# Patient Record
Sex: Male | Born: 1940
Health system: Southern US, Community
[De-identification: ages and names within clinical notes are randomized; demographics above are authoritative.]

## PROBLEM LIST (undated history)

## (undated) DIAGNOSIS — G250 Essential tremor: Secondary | ICD-10-CM

## (undated) DIAGNOSIS — J301 Allergic rhinitis due to pollen: Secondary | ICD-10-CM

## (undated) DIAGNOSIS — M940 Chondrocostal junction syndrome [Tietze]: Secondary | ICD-10-CM

## (undated) DIAGNOSIS — M5412 Radiculopathy, cervical region: Secondary | ICD-10-CM

## (undated) DIAGNOSIS — F329 Major depressive disorder, single episode, unspecified: Secondary | ICD-10-CM

## (undated) DIAGNOSIS — G252 Other specified forms of tremor: Secondary | ICD-10-CM

## (undated) DIAGNOSIS — M542 Cervicalgia: Secondary | ICD-10-CM

## (undated) DIAGNOSIS — R569 Unspecified convulsions: Secondary | ICD-10-CM

## (undated) DIAGNOSIS — M545 Low back pain, unspecified: Secondary | ICD-10-CM

## (undated) DIAGNOSIS — I6529 Occlusion and stenosis of unspecified carotid artery: Secondary | ICD-10-CM

## (undated) DIAGNOSIS — S060X9A Concussion with loss of consciousness of unspecified duration, initial encounter: Secondary | ICD-10-CM

## (undated) DIAGNOSIS — S060XAA Concussion with loss of consciousness status unknown, initial encounter: Secondary | ICD-10-CM

## (undated) DIAGNOSIS — K219 Gastro-esophageal reflux disease without esophagitis: Secondary | ICD-10-CM

## (undated) DIAGNOSIS — F32A Depression, unspecified: Secondary | ICD-10-CM

## (undated) HISTORY — DX: Allergic rhinitis due to pollen: J30.1

## (undated) HISTORY — DX: Radiculopathy, cervical region: M54.12

## (undated) HISTORY — DX: Depression, unspecified: F32.A

## (undated) HISTORY — DX: Major depressive disorder, single episode, unspecified: F32.9

## (undated) HISTORY — DX: Chondrocostal junction syndrome (tietze): M94.0

## (undated) HISTORY — DX: Essential tremor: G25.2

## (undated) HISTORY — DX: Low back pain: M54.5

## (undated) HISTORY — DX: Essential tremor: G25.0

## (undated) HISTORY — DX: Cervicalgia: M54.2

## (undated) HISTORY — DX: Occlusion and stenosis of unspecified carotid artery: I65.29

## (undated) HISTORY — DX: Low back pain, unspecified: M54.50

---

## 2000-03-02 HISTORY — PX: STAPEDES SURGERY: SHX789

## 2000-03-02 HISTORY — PX: OTHER SURGICAL HISTORY: SHX169

## 2004-08-31 ENCOUNTER — Emergency Department: Payer: Self-pay | Admitting: Emergency Medicine

## 2004-09-09 ENCOUNTER — Ambulatory Visit: Payer: Self-pay | Admitting: Urology

## 2006-03-02 HISTORY — PX: COLONOSCOPY: SHX174

## 2007-08-22 ENCOUNTER — Ambulatory Visit: Payer: Self-pay | Admitting: Gastroenterology

## 2007-11-21 DIAGNOSIS — J301 Allergic rhinitis due to pollen: Secondary | ICD-10-CM

## 2007-11-21 HISTORY — DX: Allergic rhinitis due to pollen: J30.1

## 2010-08-27 ENCOUNTER — Ambulatory Visit: Payer: Self-pay | Admitting: Family Medicine

## 2011-04-17 ENCOUNTER — Ambulatory Visit: Payer: Self-pay | Admitting: Internal Medicine

## 2012-05-02 ENCOUNTER — Ambulatory Visit: Payer: Self-pay | Admitting: Family Medicine

## 2012-05-19 ENCOUNTER — Ambulatory Visit: Payer: Self-pay | Admitting: Family Medicine

## 2012-06-13 ENCOUNTER — Encounter: Payer: Self-pay | Admitting: General Surgery

## 2012-08-10 ENCOUNTER — Ambulatory Visit: Payer: Self-pay | Admitting: General Surgery

## 2012-08-30 ENCOUNTER — Ambulatory Visit (INDEPENDENT_AMBULATORY_CARE_PROVIDER_SITE_OTHER): Payer: Medicare Other | Admitting: General Surgery

## 2012-08-30 ENCOUNTER — Encounter: Payer: Self-pay | Admitting: General Surgery

## 2012-08-30 DIAGNOSIS — I6529 Occlusion and stenosis of unspecified carotid artery: Secondary | ICD-10-CM

## 2012-08-30 DIAGNOSIS — I6523 Occlusion and stenosis of bilateral carotid arteries: Secondary | ICD-10-CM | POA: Insufficient documentation

## 2012-08-30 NOTE — Patient Instructions (Addendum)
Patient to return in 1 year.

## 2012-08-30 NOTE — Progress Notes (Signed)
Patient ID: Joseph Arroyo, male   DOB: 09-25-1940, 73 y.o.   MRN: [id number]  Chief Complaint  Patient presents with  . Follow-up    Carotid Ultrasound    HPI Joseph Arroyo is a 72 y.o. male who presents for a follow up Carotid Ultrasound. The patient has a history of mild carotid artery plaque with no symptoms. No new problems since last visit.   HPI  Past Medical History  Diagnosis Date  . Ulcer   . Depression   . Occlusion and stenosis of carotid artery without mention of cerebral infarction     Past Surgical History  Procedure Laterality Date  . Other surgical history  2002    ear surgery  . Colonoscopy  2008  . Stapedes surgery Right 2002    History reviewed. No pertinent family history.  Social History History  Substance Use Topics  . Smoking status: Never Smoker   . Smokeless tobacco: Never Used  . Alcohol Use: No    Allergies  Allergen Reactions  . Prednisone     Hyperactivity   . Wheat Bran     Current Outpatient Prescriptions  Medication Sig Dispense Refill  . B Complex Vitamins (VITAMIN B COMPLEX PO) Take 1 tablet by mouth daily.      . Bilberry 80 MG CAPS Take 1 capsule by mouth 2 (two) times daily.      . fluvoxaMINE (LUVOX) 100 MG tablet Take 200 mg by mouth daily.      . Lutein 10 MG TABS Take 1 tablet by mouth 3 (three) times daily.      . Magnesium 250 MG TABS Take 1 tablet by mouth daily.      . mirtazapine (REMERON SOL-TAB) 15 MG disintegrating tablet Take 3.75 mg by mouth at bedtime.      . Nutritional Supplements (NUTRITIONAL SUPPLEMENT PO) Take 2 tablets by mouth daily.      . Saw Palmetto, Serenoa repens, 1000 MG CAPS Take 2 capsules by mouth daily.       No current facility-administered medications for this visit.    Review of Systems Review of Systems  Constitutional: Negative.   Respiratory: Negative.   Cardiovascular: Negative.     Blood pressure 112/60, pulse 58, resp. rate 12, height 5\' 9"  (1.753 m), weight 169 lb  (76.658 kg).  Physical Exam Physical Exam  Constitutional: He is oriented to person, place, and time. He appears well-developed and well-nourished.  Neck: Neck supple. No tracheal deviation present. No thyromegaly present.  Neurological: He is alert and oriented to person, place, and time.    Data Reviewed    Assessment    Asymptomatic carotid plaque     Plan    Yearly f/u Doppler. Pt aware of potential symptoms of TIA and stroke.      Carotid Duplex study. Bilateral Duplex study of carotid arteries in neck performed. Mild focal plaquing noted on both carotid bifurcation. No flow disturbance or apparent stenosis noted.  The ICA/CCA ratio is 0.69 on right and 0.60 on left. Right vertebral has antegrade flow. Left vertebral is not visualized Findings are stable. No stenosis noted.  Drew [name] 09/01/2012, 6:36 AM

## 2012-09-01 ENCOUNTER — Encounter: Payer: Self-pay | Admitting: General Surgery

## 2012-09-23 ENCOUNTER — Encounter: Payer: Self-pay | Admitting: General Surgery

## 2012-12-09 ENCOUNTER — Ambulatory Visit: Payer: Self-pay | Admitting: Emergency Medicine

## 2013-05-17 ENCOUNTER — Encounter: Payer: Self-pay | Admitting: Neurology

## 2013-05-18 ENCOUNTER — Ambulatory Visit (INDEPENDENT_AMBULATORY_CARE_PROVIDER_SITE_OTHER): Payer: Medicare PPO | Admitting: Neurology

## 2013-05-18 ENCOUNTER — Encounter (INDEPENDENT_AMBULATORY_CARE_PROVIDER_SITE_OTHER): Payer: Self-pay

## 2013-05-18 ENCOUNTER — Encounter: Payer: Self-pay | Admitting: Neurology

## 2013-05-18 VITALS — BP 115/72 | HR 56 | Ht 68.0 in | Wt 163.0 lb

## 2013-05-18 DIAGNOSIS — R259 Unspecified abnormal involuntary movements: Secondary | ICD-10-CM

## 2013-05-18 DIAGNOSIS — M545 Low back pain, unspecified: Secondary | ICD-10-CM

## 2013-05-18 DIAGNOSIS — R251 Tremor, unspecified: Secondary | ICD-10-CM | POA: Insufficient documentation

## 2013-05-18 NOTE — Patient Instructions (Signed)
Overall you are doing fairly well but I do want to suggest a few things today:   Remember to drink plenty of fluid, eat healthy meals and do not skip any meals. Try to eat protein with a every meal and eat a healthy snack such as fruit or nuts in between meals. Try to keep a regular sleep-wake schedule and try to exercise daily, particularly in the form of walking, 20-30 minutes a day, if you can.   Please follow up with your psychiatrist to address your depression and anxiety  I would like to see you back in 6 months, sooner if we need to. Please call us with any interim questions, concerns, problems, updates or refill requests.   My clinical assistant and will answer any of your questions and relay your messages to me and also relay most of my messages to you.   Our phone number is [phone]. We also have an after hours call service for urgent matters and there is a physician on-call for urgent questions. For any emergencies you know to call 911 or go to the nearest emergency room

## 2013-05-18 NOTE — Progress Notes (Signed)
[facility]    Provider:  Dr [name] Referring Provider: Ashok [place], MD Primary Care Physician:  Ashok [place], MD  CC:  Tremors in his legs and hands  HPI:  Joseph Arroyo is a 73 y.o. male here as a referral from Dr. Rutherford Nail for tremors  Notes symptoms started a few months ago, he initially thought it was related to the fluvoxamine. He initially noticed the tremor in his legs and now notices a slight tremor in his hands. Describes it as predominantly a rest tremor, does not notice any action/postural tremor. Wife has noticed some slowing down of his walking, notes some difficulty with fine motor tasks. Has occasional episodes of muscle cramping in his legs. Notes a few brief episodes of left nostril tremor. Notes tremor is worse when he is anxious or stressed. No noted REM behavior disorder. Some difficulty with poor sense of smell. Had maternal uncle who may have had Parkinson's disease.   He switched from Fluvoxamine to Brintellix around 5 weeks ago with no improvement. Has history of being on abilify and lithium in the past, both made him feel more shaky.   Has chronic history of lower back pain.   Review of Systems: Out of a complete 14 system review, the patient complains of only the following symptoms, and all other reviewed systems are negative. + tremor, anxiety, depression  History   Social History  . Marital Status: Married    Spouse Name: N/A    Number of Children: N/A  . Years of Education: N/A   Occupational History  . Not on file.   Social History Main Topics  . Smoking status: Never Smoker   . Smokeless tobacco: Never Used  . Alcohol Use: No  . Drug Use: No  . Sexual Activity: Not on file   Other Topics Concern  . Not on file   Social History Narrative   Married to Joseph Arroyo, has 2 children   Right handed   Master's plus    2 cups daily    Family History  Problem Relation Age of Onset  . Heart disease      Past Medical  History  Diagnosis Date  . Ulcer   . Depression   . Occlusion and stenosis of carotid artery without mention of cerebral infarction   . Lumbago   . Thrombocytopenia, unspecified   . Other malaise and fatigue 11/21/2007  . Allergic rhinitis due to pollen 11/21/2007  . Contusion of toe 09/12/2008  . Anxiety state, unspecified 11/28/2008  . Brachial neuritis or radiculitis NOS   . Cramp of limb   . Abnormal involuntary movements(781.0)   . Acute upper respiratory infections of unspecified site   . Cervicalgia   . Conjunctivitis unspecified   . Sprain of hand, unspecified site   . Enthesopathy of unspecified site   . Pain in limb   . Essential and other specified forms of tremor   . Hip, thigh, leg, and ankle, insect bite, nonvenomous, without mention of infection(916.4)   . Chest pain, unspecified   . Sprain of neck   . Dysfunction of eustachian tube   . Costal chondritis   . Sleep disturbance, unspecified   . Contact dermatitis and other eczema due to plants (except food)   . Acute sinusitis, unspecified     Past Surgical History  Procedure Laterality Date  . Other surgical history  2002    ear surgery  . Colonoscopy  2008  . Stapedes surgery Right 2002  Current Outpatient Prescriptions  Medication Sig Dispense Refill  . ALPRAZolam (XANAX) 0.25 MG tablet       . B Complex Vitamins (VITAMIN B COMPLEX PO) Take 1 tablet by mouth daily.      . Capsicum-Garlic 350-093 MG CAPS Take 200-300 mg by mouth.      . Lutein 10 MG TABS Take 1 tablet by mouth 3 (three) times daily.      . Magnesium 250 MG TABS Take 1 tablet by mouth daily.      . mirtazapine (REMERON SOL-TAB) 15 MG disintegrating tablet Take 3.75 mg by mouth at bedtime.      . Multiple Vitamins-Minerals (MULTIVITAMIN WITH MINERALS) tablet Take 1 tablet by mouth daily.      . Saw Palmetto, Serenoa repens, 1000 MG CAPS Take 2 capsules by mouth daily.      . Vortioxetine HBr (BRINTELLIX PO) Take 15 mg by mouth.        No current facility-administered medications for this visit.    Allergies as of 05/18/2013 - Review Complete 05/18/2013  Allergen Reaction Noted  . Prednisone  06/30/2012  . Wheat bran  06/30/2012    Vitals: BP 115/72  Pulse 56  Ht 5\' 8"  (1.727 m)  Wt 163 lb (73.936 kg)  BMI 24.79 kg/m2 Last Weight:  Wt Readings from Last 1 Encounters:  05/18/13 163 lb (73.936 kg)   Last Height:   Ht Readings from Last 1 Encounters:  05/18/13 5\' 8"  (1.727 m)     Physical exam: Exam: Gen: NAD, conversant Eyes: anicteric sclerae, moist conjunctivae HENT: Atraumatic, oropharynx clear Neck: Trachea midline; supple,  Lungs: CTA, no wheezing, rales, rhonic                          CV: RRR, no MRG Abdomen: Soft, non-tender;  Extremities: No peripheral edema  Skin: Normal temperature, no rash,  Psych: Appropriate affect, pleasant  Neuro: MS: AA&Ox3, appropriately interactive, normal affect   Speech: fluent w/o paraphasic error  Memory: good recent and remote recall  CN: PERRL, EOMI no nystagmus, no ptosis, sensation intact to LT V1-V3 bilat, face symmetric, no weakness, hearing grossly intact, palate elevates symmetrically, shoulder shrug 5/5 bilat,  tongue protrudes midline, no fasiculations noted.  Motor: normal bulk and tone Strength: 5/5  In all extremities  Coord: mild rest tremor lue and bilat LE, mild postural and intention tremor bilat hands. Mild bradykinesia with finger taps, none with hand opening.   Reflexes: symmetrical, bilat downgoing toes  Sens: LT intact in all extremities  Gait: mild decrease arm swing on the left. Tandem gait intact. Able to walk on heels and toes. Romberg absent.   Assessment:  After physical and neurologic examination, review of laboratory studies, imaging, neurophysiology testing and pre-existing records, assessment will be reviewed on the problem list.  Plan:  Treatment plan and additional workup will be reviewed under Problem  List.  1)Tremor 2)Lower back pain  72y/o gentleman presenting for initial evaluation of tremor and lower back pain. Has a history of severe depression, has been on abilify and lithium in the past. Based on clinical history and physical exam the differential would be essential tremor vs enhanced physiologic tremor vs less likely a parkinsonism. Prior medication use of abilify and lithium could also be playing a role in his symptoms. Suspect tremor is ultimately ET vs enhanced phsyiologic tremor. Discussed different medication options but patient wishes to hold off on medication at this time. Will  follow up in 6 months and check for progression at that time. Counseled patient to continue with exercise and stretching for lower back pain.    [name], DO  [facility] [address] [place], [place] [address]  Phone [phone] Fax [phone]

## 2013-06-15 ENCOUNTER — Ambulatory Visit: Payer: Self-pay | Admitting: Cardiovascular Disease

## 2013-06-23 ENCOUNTER — Encounter: Payer: Self-pay | Admitting: Cardiovascular Disease

## 2013-06-23 ENCOUNTER — Encounter (INDEPENDENT_AMBULATORY_CARE_PROVIDER_SITE_OTHER): Payer: Self-pay

## 2013-06-23 ENCOUNTER — Ambulatory Visit (INDEPENDENT_AMBULATORY_CARE_PROVIDER_SITE_OTHER): Payer: Medicare PPO | Admitting: Cardiovascular Disease

## 2013-06-23 VITALS — BP 118/72 | HR 57 | Ht 68.0 in | Wt 157.0 lb

## 2013-06-23 DIAGNOSIS — F419 Anxiety disorder, unspecified: Secondary | ICD-10-CM

## 2013-06-23 DIAGNOSIS — F411 Generalized anxiety disorder: Secondary | ICD-10-CM

## 2013-06-23 DIAGNOSIS — R079 Chest pain, unspecified: Secondary | ICD-10-CM

## 2013-06-23 DIAGNOSIS — R251 Tremor, unspecified: Secondary | ICD-10-CM

## 2013-06-23 DIAGNOSIS — R259 Unspecified abnormal involuntary movements: Secondary | ICD-10-CM

## 2013-06-23 DIAGNOSIS — R0602 Shortness of breath: Secondary | ICD-10-CM

## 2013-06-23 NOTE — Assessment & Plan Note (Addendum)
Etiology of his chest pain is unclear. We had a long discussion about his symptoms, risk factors, types of testing available. He is not a smoker, nondiabetic. He does report significant family history of CAD. He is unable to treadmill given his back pain, leg weakness. We will schedule a pharmacologic Myoview at his convenience next week to rule out ischemia

## 2013-06-23 NOTE — Assessment & Plan Note (Signed)
He has noticed progression of the left leg tremor, some leg weakness over the past year.

## 2013-06-23 NOTE — Assessment & Plan Note (Signed)
Anxiety managed by psychiatry in [place]. Currently taking Xanax 3 times per day in addition to Zyprexa and other medication

## 2013-06-23 NOTE — Progress Notes (Signed)
Patient ID: Joseph Arroyo, male    DOB: Aug 20, 1940, 73 y.o.   MRN: [id number]  HPI Comments: Joseph Arroyo is a pleasant 73 year old gentleman with a history of severe anxiety, chronic low back pain, referred for symptoms of chest pain. He is [facility] by [facility] in April 2015.  He reports having chest pain over the past several years, possibly worse over the past several months. Typically this will present with stress. He states that he is active, does walking, rides on a bike, sometimes with discomfort in his chest. More commonly no, this presents with stress and anxiety. He describes it as a tightening deep in his left chest radiating sometimes to his right shoulder blade. His family is very anxious and all have recommended he have a stress test.  He has been "slowing down" and gait is more unstable. He does have periodic diaphoresis. Legs feeling somewhat weaker.  He believes and herbal medications and does significant homeopathic/over-the-counter products.   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 He does not feel that he would be able to treadmill as his back hurts and legs are feeling weaker  EKG shows normal sinus rhythm with rate 57 beats per minute with nonspecific ST abnormality artifact noted on EKG from tremor,    Outpatient Encounter Prescriptions as of 06/23/2013  Medication Sig  . ALPRAZolam (XANAX) 0.25 MG tablet Take 0.25 mg by mouth 3 (three) times daily.   . B Complex Vitamins (VITAMIN B COMPLEX PO) Take 1 tablet by mouth daily.  . Capsicum-Garlic 008-676 MG CAPS Take 200-300 mg by mouth.  . Lutein 10 MG TABS Take 1 tablet by mouth 3 (three) times daily.  . Magnesium 250 MG TABS Take 1 tablet by mouth daily.  .  Multiple Vitamins-Minerals (MULTIVITAMIN WITH MINERALS) tablet Take 1 tablet by mouth daily.  Marland Kitchen OLANZapine (ZYPREXA) 5 MG tablet Take 5 mg by mouth at bedtime.  . Saw Palmetto, Serenoa repens, 1000 MG CAPS Take 2 capsules by mouth daily.  . Vortioxetine HBr (BRINTELLIX PO) Take 15 mg by mouth.     Review of Systems  Constitutional: Negative.   HENT: Negative.   Eyes: Negative.   Respiratory: Negative.   Cardiovascular: Positive for chest pain.  Gastrointestinal: Negative.   Endocrine: Negative.   Musculoskeletal: Positive for back pain.  Skin: Negative.   Allergic/Immunologic: Negative.   Neurological: Negative.   Hematological: Negative.   Psychiatric/Behavioral: Negative.   All other systems reviewed and are negative.   BP 118/72  Pulse 57  Ht 5\' 8"  (1.727 m)  Wt 157 lb (71.215 kg)  BMI 23.88 kg/m2  Physical Exam  Nursing note and vitals reviewed. Constitutional: He is oriented to person, place, and time. He appears well-developed and well-nourished.  HENT:  Head: Normocephalic.  Nose: Nose normal.  Mouth/Throat: Oropharynx is clear and moist.  Eyes: Conjunctivae are normal. Pupils are equal, round, and reactive to light.  Neck: Normal range of motion. Neck supple. No JVD present.  Cardiovascular: Normal rate, regular rhythm, S1 normal, S2 normal, normal heart sounds and intact distal pulses.  Exam reveals no gallop and no friction rub.   No murmur heard. Pulmonary/Chest: Effort normal and breath sounds normal. No respiratory distress. He has no wheezes. He has no rales.  He exhibits no tenderness.  Abdominal: Soft. Bowel sounds are normal. He exhibits no distension. There is no tenderness.  Musculoskeletal: Normal range of motion. He exhibits no edema and no tenderness.  Lymphadenopathy:    He has no cervical adenopathy.  Neurological: He is alert and oriented to person, place, and time. Coordination normal.  Skin: Skin is warm and dry. No rash noted. No erythema.   Psychiatric: He has a normal mood and affect. His behavior is normal. Judgment and thought content normal.      Assessment and Plan

## 2013-06-23 NOTE — Patient Instructions (Addendum)
You are doing well. No medication changes were made.  We will schedule a lexiscan myoview for chest pain  Please call us if you have new issues that need to be addressed before your next appt.   [place]  Your caregiver has ordered a Stress Test with nuclear imaging. The purpose of this test is to evaluate the blood supply to your heart muscle. This procedure is referred to as a "Non-Invasive Stress Test." This is because other than having an IV started in your vein, nothing is inserted or "invades" your body. Cardiac stress tests are done to find areas of poor blood flow to the heart by determining the extent of coronary artery disease (CAD). Some patients exercise on a treadmill, which naturally increases the blood flow to your heart, while others who are  unable to walk on a treadmill due to physical limitations have a pharmacologic/chemical stress agent called Lexiscan . This medicine will mimic walking on a treadmill by temporarily increasing your coronary blood flow.   Please note: these test may take anywhere between 2-4 hours to complete  PLEASE REPORT TO [place] AT THE FIRST DESK WILL DIRECT YOU WHERE TO GO  Date of Procedure:________Tuesday, May 5_______________  Arrival Time for Procedure:______7:15am__________________  Instructions regarding medication:   You may take all of your medications with a sip of water  PLEASE NOTIFY THE OFFICE AT LEAST 24 HOURS IN ADVANCE IF YOU ARE UNABLE TO [place].  [phone] AND  PLEASE NOTIFY NUCLEAR MEDICINE AT [facility] AT LEAST 24 HOURS IN ADVANCE IF YOU ARE UNABLE TO KEEP YOUR APPOINTMENT. [phone]  How to prepare for your Myoview test:  1. Do not eat or drink after midnight 2. No caffeine for 24 hours prior to test 3. No smoking 24 hours prior to test. 4. Your medication may be taken with water.  If your doctor stopped a medication because of this test, do not take that  medication. 5. Ladies, please do not wear dresses.  Skirts or pants are appropriate. Please wear a short sleeve shirt. 6. No perfume, cologne or lotion. 7. Wear comfortable walking shoes. No heels!

## 2013-06-29 ENCOUNTER — Telehealth: Payer: Self-pay | Admitting: *Deleted

## 2013-06-29 NOTE — Telephone Encounter (Signed)
Spoke w/ pt.  He reports that he has a friend that has been taking Zinzino Oil, which a homeopathic blend of different fish oils.  Pt received a bottle yesterday.  He is interested in trying this for inflammation, as his friend had positive results.  He is concerned that this may interfere w/ his lexiscan sched for next Tues.  He would like for Dr. Rockey Situ to go the website ([email or web address]) and let him know if is okay for him to take this.  Please advise.  Thank you.

## 2013-06-29 NOTE — Telephone Encounter (Signed)
Patient has some questions regarding the myoview. Please call

## 2013-06-29 NOTE — Telephone Encounter (Signed)
  Should be ok Maybe wait until after tuesday

## 2013-06-30 NOTE — Telephone Encounter (Signed)
Spoke w/ pt.  Advised him of Dr. Gollan's recommendation. He is agreeable to this and will call w/ further questions or concerns.  

## 2013-07-04 ENCOUNTER — Ambulatory Visit: Payer: Self-pay | Admitting: Cardiovascular Disease

## 2013-07-04 DIAGNOSIS — R079 Chest pain, unspecified: Secondary | ICD-10-CM

## 2013-07-05 ENCOUNTER — Other Ambulatory Visit: Payer: Self-pay

## 2013-07-05 DIAGNOSIS — R079 Chest pain, unspecified: Secondary | ICD-10-CM

## 2013-07-05 DIAGNOSIS — R0602 Shortness of breath: Secondary | ICD-10-CM

## 2013-07-10 ENCOUNTER — Telehealth: Payer: Self-pay

## 2013-07-10 NOTE — Telephone Encounter (Signed)
Left detailed message on pt's voicemail w/ pt's results.

## 2013-07-10 NOTE — Telephone Encounter (Signed)
Pt would like stress test results. Please call. Pt states it is ok to leave a message on vm.

## 2013-07-21 ENCOUNTER — Telehealth: Payer: Self-pay

## 2013-07-21 NOTE — Telephone Encounter (Signed)
Pt called and has a question regarding his lexiscan. Please call.

## 2013-07-21 NOTE — Telephone Encounter (Signed)
Pt has appt to discuss w/ Dr. Rockey Situ next week.

## 2013-07-25 ENCOUNTER — Telehealth: Payer: Self-pay

## 2013-07-25 NOTE — Telephone Encounter (Signed)
Pt called and states he is having dizzy spells. Please call.

## 2013-07-25 NOTE — Telephone Encounter (Signed)
Spoke w/ pt.  He reports that he was at the [facility] yesterday and saw a Social worker.  While there, he reports that his mind "went blank" and he could not remember the name of one of his meds.  Pt is very concerned about this, as this has never happened before and he is wondering if he should go to urgent care. Discussed w/ pt that this is a normal occurrence and nothing to be alarmed about. Pt inquired about the s&s of a stroke. Discussed w/ pt.  He would like some info printed out to pick up at his ov tomorrow.  Pt reports dizziness, worse when standing from a sitting or lying position. Pt does not have a BP monitor at home.  Advised pt to obtain one for home use. Had lengthy discussion w/ pt.  He is very anxious and concerned about multiple health issues.  Advised pt to keep appt w/ Dr. Rockey Situ tomorrow am.  Pt is very appreciative and will call w/ further questions or concerns.

## 2013-07-26 ENCOUNTER — Ambulatory Visit (INDEPENDENT_AMBULATORY_CARE_PROVIDER_SITE_OTHER): Payer: Medicare PPO | Admitting: Cardiovascular Disease

## 2013-07-26 ENCOUNTER — Encounter: Payer: Self-pay | Admitting: Cardiovascular Disease

## 2013-07-26 VITALS — BP 104/70 | HR 64 | Ht 68.0 in | Wt 156.0 lb

## 2013-07-26 DIAGNOSIS — R2681 Unsteadiness on feet: Secondary | ICD-10-CM

## 2013-07-26 DIAGNOSIS — F32A Depression, unspecified: Secondary | ICD-10-CM

## 2013-07-26 DIAGNOSIS — F419 Anxiety disorder, unspecified: Secondary | ICD-10-CM

## 2013-07-26 DIAGNOSIS — F3342 Major depressive disorder, recurrent, in full remission: Secondary | ICD-10-CM | POA: Insufficient documentation

## 2013-07-26 DIAGNOSIS — R269 Unspecified abnormalities of gait and mobility: Secondary | ICD-10-CM

## 2013-07-26 DIAGNOSIS — R079 Chest pain, unspecified: Secondary | ICD-10-CM

## 2013-07-26 DIAGNOSIS — I951 Orthostatic hypotension: Secondary | ICD-10-CM

## 2013-07-26 DIAGNOSIS — F3289 Other specified depressive episodes: Secondary | ICD-10-CM

## 2013-07-26 DIAGNOSIS — F411 Generalized anxiety disorder: Secondary | ICD-10-CM

## 2013-07-26 DIAGNOSIS — F329 Major depressive disorder, single episode, unspecified: Secondary | ICD-10-CM

## 2013-07-26 NOTE — Assessment & Plan Note (Signed)
Anxiety continues to be a major issue. He takes Xanax several times per day

## 2013-07-26 NOTE — Patient Instructions (Addendum)
Please increase your fluids High salt intake Watch out for weight loss, this can drop your blood pressure  Wear TED hose.  [email or web address] has cheap compression hose Monitor your blood pressure at home  Please call us if you have new issues that need to be addressed before your next appt.

## 2013-07-26 NOTE — Assessment & Plan Note (Signed)
Suggested he push the fluids, salt intake, compression hose. Rise to a standing position slowly given his unsteady gait. If blood pressure continues to run low, may need to consider Florinef or midodrine

## 2013-07-26 NOTE — Assessment & Plan Note (Signed)
Given his tremor, unsteady gait, sleep disorder, depression, anxiety, I'm concerned about other causes of his symptoms such as lewy body disease.

## 2013-07-26 NOTE — Progress Notes (Signed)
Patient ID: Joseph Arroyo, male    DOB: [birth year], 73 y.o.   MRN: [id number]  HPI Comments: Joseph Arroyo is a pleasant 73 year old gentleman with a history of severe anxiety, depression, chronic low back pain, referred for symptoms of chest pain. [facility] is [facility] in April 2015. 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  For previous episodes of chest pain a stress test was ordered. Pharmacological myocardial study showed no ischemia, fixed defect in the inferior wall likely secondary to GI artifact and attenuation artifact We discussed his study in detail today.  Wife reports an episode where he was in the supermarket and lost his memory of certain issues including his medications. He seemed confused, then recovered several hours later. He continues to be active, does biking, other exercise. He does report some orthostasis. Blood pressure has been running low at home.  He has been "slowing down" and gait is more unstable. He does have periodic diaphoresis. Legs feeling somewhat weaker.  He believes and herbal medications and does significant homeopathic/over-the-counter products.    Outpatient Encounter Prescriptions as of 07/26/2013  Medication Sig  . ALPRAZolam (XANAX) 0.25 MG tablet Take 0.25 mg by mouth 3 (three) times daily.   . B Complex Vitamins (VITAMIN B COMPLEX PO) Take 1 tablet by mouth daily.  . Capsicum-Garlic 789-381 MG CAPS Take 200-300 mg by mouth.  . DULoxetine (CYMBALTA) 60 MG capsule Take 60 mg by mouth daily.  . Lutein 10 MG TABS Take 1 tablet by mouth 3 (three) times daily.  . Magnesium 250 MG TABS Take 1 tablet by mouth daily.  . Multiple Vitamins-Minerals (MULTIVITAMIN WITH MINERALS) tablet Take 1  tablet by mouth daily.  Marland Kitchen OLANZapine (ZYPREXA) 5 MG tablet Take 5 mg by mouth at bedtime.  . Saw Palmetto, Serenoa repens, 1000 MG CAPS Take 2 capsules by mouth daily.    Review of Systems  Constitutional: Negative.   HENT: Negative.   Eyes: Negative.   Respiratory: Negative.   Cardiovascular: Negative.   Gastrointestinal: Negative.   Endocrine: Negative.   Musculoskeletal: Negative.   Skin: Negative.   Allergic/Immunologic: Negative.   Neurological: Positive for light-headedness.  Hematological: Negative.   Psychiatric/Behavioral: Positive for dysphoric mood.  All other systems reviewed and are negative.   BP 104/70  Pulse 64  Ht 5\' 8"  (1.727 m)  Wt 156 lb (70.761 kg)  BMI 23.73 kg/m2  Physical Exam  Nursing note and vitals reviewed. Constitutional: He is oriented to person, place, and time. He appears well-developed and well-nourished.  HENT:  Head: Normocephalic.  Nose: Nose normal.  Mouth/Throat: Oropharynx is clear and moist.  Eyes: Conjunctivae are normal. Pupils are equal, round, and reactive to light.  Neck: Normal range of motion. Neck supple. No JVD present.  Cardiovascular: Normal rate, regular rhythm, S1 normal, S2 normal, normal heart sounds and intact distal pulses.  Exam reveals no gallop and no friction rub.   No murmur heard. Pulmonary/Chest: Effort normal and breath sounds normal. No respiratory distress. He has no wheezes. He has no rales. He exhibits no tenderness.  Abdominal: Soft. Bowel sounds are normal. He exhibits no distension. There is no tenderness.  Musculoskeletal: Normal range of motion. He exhibits no edema and no tenderness.  Lymphadenopathy:  He has no cervical adenopathy.  Neurological: He is alert and oriented to person, place, and time. Coordination normal.  Skin: Skin is warm and dry. No rash noted. No erythema.  Psychiatric: He has a normal mood and affect. His behavior is normal. Judgment and thought content normal.       Assessment and Plan

## 2013-07-26 NOTE — Assessment & Plan Note (Signed)
He reports new psychiatric medications have recently been started

## 2013-07-26 NOTE — Assessment & Plan Note (Signed)
Recent stress test showing no significant ischemia. Fixed defect in the inferior wall likely attenuation artifact. Currently with no symptoms of chest pain with exertion

## 2013-09-05 ENCOUNTER — Ambulatory Visit: Payer: Self-pay | Admitting: General Surgery

## 2013-09-20 ENCOUNTER — Ambulatory Visit (INDEPENDENT_AMBULATORY_CARE_PROVIDER_SITE_OTHER): Payer: Medicare PPO | Admitting: General Surgery

## 2013-09-20 ENCOUNTER — Encounter: Payer: Self-pay | Admitting: General Surgery

## 2013-09-20 ENCOUNTER — Other Ambulatory Visit: Payer: Medicare PPO

## 2013-09-20 VITALS — BP 110/68 | HR 80 | Resp 12 | Ht 68.0 in | Wt 147.0 lb

## 2013-09-20 DIAGNOSIS — I6529 Occlusion and stenosis of unspecified carotid artery: Secondary | ICD-10-CM

## 2013-09-20 NOTE — Patient Instructions (Signed)
Patient to return in 1 year for follow up. The patient is aware to call back for any questions or concerns.  

## 2013-09-20 NOTE — Progress Notes (Signed)
Patient ID: [name], male   DOB: 1941/01/08, 73 y.o.   MRN: [id number]  The patient presents for a 1 year follow up for carotid doppler. Patient has had some diarrhea for near couple of weeks. He has not sought attention for this. He intends to see Dr. Derrek Monaco. Also he has a mild tremor and is being evaluated by neurology.   Abdomen soft non tender.  Doppler of carotid today. shows focal mild plaque on both sides, no flow changes.  Patient to return in 1 year for carotid doppler. Recommend he use immodium for his diarrhea.

## 2013-11-21 ENCOUNTER — Encounter (INDEPENDENT_AMBULATORY_CARE_PROVIDER_SITE_OTHER): Payer: Self-pay

## 2013-11-21 ENCOUNTER — Ambulatory Visit (INDEPENDENT_AMBULATORY_CARE_PROVIDER_SITE_OTHER): Payer: Medicare PPO | Admitting: Neurology

## 2013-11-21 ENCOUNTER — Encounter: Payer: Self-pay | Admitting: Neurology

## 2013-11-21 VITALS — BP 103/72 | HR 60 | Ht 68.0 in | Wt 146.8 lb

## 2013-11-21 DIAGNOSIS — G20 Parkinson's disease: Secondary | ICD-10-CM

## 2013-11-21 MED ORDER — CARBIDOPA-LEVODOPA 25-100 MG PO TABS: 1.0000 | ORAL_TABLET | Freq: Three times a day (TID) | ORAL | Status: DC

## 2013-11-21 NOTE — Patient Instructions (Addendum)
Overall you are doing fairly well but I do want to suggest a few things today:   Remember to drink plenty of fluid, eat healthy meals and do not skip any meals. Try to eat protein with a every meal and eat a healthy snack such as fruit or nuts in between meals. Try to keep a regular sleep-wake schedule and try to exercise daily, particularly in the form of walking, 20-30 minutes a day, if you can.   As far as your medications are concerned, I would like to suggest the following: 1)Please start Sinemet 25-100 1/2 tablet three times a day. Please take at 8am, noon and 4pm. Do this for 1 week and then increase it to 1 whole tabet three times a day at 8am, noon and 4pm.  I would like you to have a MRI of the brain. You will be called to schedule this. If MRI is not an option we will go ahead with a CT of the head.   We can consider a DAT scan in the future.  I would like you to work with physical therapy.You will be called to schedule this.   You can check out the [employer] at [email or web address]  Please follow up with Dr Rexene Alberts in 2-3 months.   My clinical assistant and will answer any of your questions and relay your messages to me and also relay most of my messages to you.   Our phone number is [phone]. We also have an after hours call service for urgent matters and there is a physician on-call for urgent questions. For any emergencies you know to call 911 or go to the nearest emergency room

## 2013-11-21 NOTE — Progress Notes (Signed)
[facility]    Provider:  Dr [name] Referring Provider: Ashok Norris, MD Primary Care Physician:  Ashok Norris, MD  CC:  Tremors in his legs and hands  HPI:  Joseph Arroyo is a 73 y.o. male here as a referral from Dr. Rutherford Nail for tremors. Last visit was 05/2013. They feel that he has had a marked progressive decline since last visit. He has had difficulty walking, increased shuffling of his feet, has had a few falls. Notes the falls are related to tripping over his feet. Difficulty with initiation of gait. Tremor has gotten worse, notes it is more pronounced throughout the day. They note that his handwriting has gotten messier and smaller.   Recently was on zyprexa and this was stopped and he was restarted on Luvox for his depression.   Initial  Notes symptoms started a few months ago, he initially thought it was related to the fluvoxamine. He initially noticed the tremor in his legs and now notices a slight tremor in his hands. Describes it as predominantly a rest tremor, does not notice any action/postural tremor. Wife has noticed some slowing down of his walking, notes some difficulty with fine motor tasks. Has occasional episodes of muscle cramping in his legs. Notes a few brief episodes of left nostril tremor. Notes tremor is worse when he is anxious or stressed. No noted REM behavior disorder. Some difficulty with poor sense of smell. Had maternal uncle who may have had Parkinson's disease.   He switched from Fluvoxamine to Brintellix around 5 weeks ago with no improvement. Has history of being on abilify and lithium in the past, both made him feel more shaky.   Has chronic history of lower back pain.   Review of Systems: Out of a complete 14 system review, the patient complains of only the following symptoms, and all other reviewed systems are negative. + tremor, anxiety, depression  History   Social History  . Marital Status: Married    Spouse  Name: [name]     Number of Children: 2  . Years of Education: 12   Occupational History  .    [employer]land [employer] Retired     Social History Main Topics  . Smoking status: Never Smoker   . Smokeless tobacco: Never Used  . Alcohol Use: No  . Drug Use: No  . Sexual Activity: Not on file   Other Topics Concern  . Not on file   Social History Narrative   Married to [name], has 2 children   Right handed   Master's plus    2 cups daily    Family History  Problem Relation Age of Onset  . Heart failure Mother     Past Medical History  Diagnosis Date  . Ulcer   . Depression   . Occlusion and stenosis of carotid artery without mention of cerebral infarction   . Lumbago   . Thrombocytopenia, unspecified   . Other malaise and fatigue 11/21/2007  . Allergic rhinitis due to pollen 11/21/2007  . Contusion of toe 09/12/2008  . Anxiety state, unspecified 11/28/2008  . Brachial neuritis or radiculitis NOS   . Cramp of limb   . Abnormal involuntary movements(781.0)   . Acute upper respiratory infections of unspecified site   . Cervicalgia   . Conjunctivitis unspecified   . Sprain of hand, unspecified site   . Enthesopathy of unspecified site   . Pain in limb   . Essential and other specified forms of tremor   . Hip,  thigh, leg, and ankle, insect bite, nonvenomous, without mention of infection(916.4)   . Chest pain, unspecified   . Sprain of neck   . Dysfunction of eustachian tube   . Costal chondritis   . Sleep disturbance, unspecified   . Contact dermatitis and other eczema due to plants (except food)   . Acute sinusitis, unspecified     Past Surgical History  Procedure Laterality Date  . Other surgical history  2002    ear surgery  . Colonoscopy  2008  . Stapedes surgery Right 2002    Current Outpatient Prescriptions  Medication Sig Dispense Refill  . ALPRAZolam (XANAX) 0.25 MG tablet Take 0.25 mg by mouth 3 (three) times daily.       . B Complex Vitamins (VITAMIN B COMPLEX PO) Take  1 tablet by mouth daily.      . Capsicum-Garlic 299-371 MG CAPS Take 200-300 mg by mouth.      . fludrocortisone (FLORINEF) 0.1 MG tablet       . fluvoxaMINE (LUVOX) 50 MG tablet       . Lutein 10 MG TABS Take 1 tablet by mouth 3 (three) times daily.      . Magnesium 250 MG TABS Take 1 tablet by mouth daily.      . Multiple Vitamins-Minerals (MULTIVITAMIN WITH MINERALS) tablet Take 1 tablet by mouth daily.      . Saw Palmetto, Serenoa repens, 1000 MG CAPS Take 2 capsules by mouth daily.       No current facility-administered medications for this visit.    Allergies as of 11/21/2013 - Review Complete 09/20/2013  Allergen Reaction Noted  . Prednisone  06/30/2012  . Wheat bran  06/30/2012    Vitals: BP 103/72  Pulse 60  Ht 5\' 8"  (1.727 m)  Wt 146 lb 12.8 oz (66.588 kg)  BMI 22.33 kg/m2 Last Weight:  Wt Readings from Last 1 Encounters:  11/21/13 146 lb 12.8 oz (66.588 kg)   Last Height:   Ht Readings from Last 1 Encounters:  11/21/13 5\' 8"  (1.727 m)     Physical exam: Exam: Gen: NAD, conversant Eyes: anicteric sclerae, moist conjunctivae HENT: Atraumatic, oropharynx clear Neck: Trachea midline; supple,  Lungs: CTA, no wheezing, rales, rhonic                          CV: RRR, no MRG Abdomen: Soft, non-tender;  Extremities: No peripheral edema  Skin: Normal temperature, no rash,  Psych: Appropriate affect, pleasant  Neuro: MS: AA&Ox3, appropriately interactive, normal affect   Speech: fluent w/o paraphasic error  Memory: good recent and remote recall  CN: PERRL, EOMI no nystagmus, no ptosis, sensation intact to LT V1-V3 bilat, face symmetric, no weakness, hearing grossly intact, palate elevates symmetrically, shoulder shrug 5/5 bilat,  tongue protrudes midline, no fasiculations noted.  Motor: normal bulk and tone Strength: 5/5  In all extremities  Coord: moderate rest tremor lue>rue and bilat LE, mild postural and intention tremor bilat hands. Mild to mode  bradykinesia with finger taps, none with hand opening.   Reflexes: symmetrical, bilat downgoing toes  Sens: LT intact in all extremities  Gait: mild decrease arm swing on the left. Tandem gait intact. Able to walk on heels and toes. Romberg absent.   Assessment:  After physical and neurologic examination, review of laboratory studies, imaging, neurophysiology testing and pre-existing records, assessment will be reviewed on the problem list.  Plan:  Treatment plan and additional workup will  be reviewed under Problem List.  1)Tremor 2)Lower back pain  72y/o gentleman presenting for follow up evaluation of tremor and lower back pain. Symptoms have progressed since last visit and appear more parkinsonian at this time. Has a history of severe depression, has been on abilify, zyprexa and lithium in the past. Will check head CT (unable to have MRI due to inner-ear implants. Will start Sinemet 25-100 1 tab TID. Will refer to neuro-rehab for physical therapy evaluation. Follow up with Dr Rexene Alberts in 2 months.    A total of 55 minutes was spent in with this patient. Over half this time was spent on counseling patient on the diagnosis and different therapeutic options available.   [name], DO  [facility] [address] [place], [place] [address]  Phone [phone] Fax [phone]

## 2013-11-29 ENCOUNTER — Ambulatory Visit: Payer: Medicare PPO | Attending: Neurology | Admitting: Physical Therapy

## 2013-11-29 ENCOUNTER — Telehealth: Payer: Self-pay | Admitting: Neurology

## 2013-11-29 ENCOUNTER — Other Ambulatory Visit: Payer: Self-pay | Admitting: Neurology

## 2013-11-29 DIAGNOSIS — IMO0001 Reserved for inherently not codable concepts without codable children: Secondary | ICD-10-CM | POA: Diagnosis not present

## 2013-11-29 DIAGNOSIS — R269 Unspecified abnormalities of gait and mobility: Secondary | ICD-10-CM | POA: Diagnosis not present

## 2013-11-29 DIAGNOSIS — G20 Parkinson's disease: Secondary | ICD-10-CM

## 2013-11-29 NOTE — Telephone Encounter (Signed)
Returned call to husband. All questions answered.

## 2013-11-29 NOTE — Telephone Encounter (Signed)
Patient calling to request a call.  States his wife spoke to Dr. [name] but he would like a call as well.  Has several questions.  Please call [phone]

## 2013-11-29 NOTE — Telephone Encounter (Signed)
Patient's spouse requesting a return calling regarding carbidopa-levodopa (SINEMET IR) 25-100 MG per tablet.  Please call and advise.

## 2013-11-29 NOTE — Telephone Encounter (Signed)
Returned call. All questions answered. We are awaiting his MRI brain.

## 2013-12-01 NOTE — Telephone Encounter (Signed)
Patient called again today. I called and discussed with him. Having more anxiety on carb/levo 1 tab TID; was doing better on half tab TID; advised to continue on half tab TID for 1 more week, then gradually increase to 1 tab TID. He is scheduled to follow up with [name] in 2 months.   Penni Bombard, MD 25/0/5397, 6:73 PM Certified in Neurology, Neurophysiology and Neuroimaging  [facility] [address], [place], [place] [address] [phone]

## 2013-12-04 ENCOUNTER — Telehealth: Payer: Self-pay | Admitting: Neurology

## 2013-12-04 NOTE — Telephone Encounter (Signed)
Patient would like a call back from the nurse. I told him since he is having worsening symptoms he would probably need an earlier appointment.

## 2013-12-04 NOTE — Telephone Encounter (Signed)
Patient stated he's experiencing Fatigue and still very anxious.  After speaking with Dr. Leta Baptist on 10/2, pt was instructed to continue 1/2 tab for one more week.  Patient still very concerned about anxiety.  Please call and advise, may call after 4:30 today and anytime tomorrow.  May leave detailed message on voicemail.

## 2013-12-04 NOTE — Telephone Encounter (Signed)
Probably best to see PCP for anxiety. Will keep appt in December.

## 2013-12-04 NOTE — Telephone Encounter (Signed)
Former Occupational psychologist patient, he has appt with [facility] on 12/15. See 10/5 phone note.

## 2013-12-06 NOTE — Telephone Encounter (Signed)
I called pt.  He has restarted the Luvox for depression around 1 mo ago.   Started the !/2 tab 25-100 mg tid for one week then increase to 1 tab tid.  He did call and speak to Dr. Leta Baptist and is now taking the 1/2 tab po tid.  Asking if taking 1 tab am and 1/2 tab noon and pm.  He is having increased anxiety and fatigue, unsteadiness.   SE?  [phone].

## 2013-12-07 ENCOUNTER — Ambulatory Visit: Payer: Self-pay | Admitting: Neurology

## 2013-12-07 NOTE — Telephone Encounter (Signed)
Please advise patient that it can take any antidepressant up to 6-8 weeks to fully take effect. Please also advised patient that typically Sinemet does not cause increase in anxiety. He can titrate as slowly as he likes. He can go up to one pill in the morning and half a pill for the other 2 doses for the next several days before trying to go further up but keep in mind that anxiety may respond to Luvox slowly. I've not very familiar with Luvox.

## 2013-12-08 NOTE — Telephone Encounter (Signed)
I called pt and relayed the information in a VM.  He is to call back if questions.   Relayed that he can take 1/2 tablet po bid, or as wife stated to try 1 tablet am and 1/2 tab noon and pm of the sinemet 25-100.

## 2013-12-13 ENCOUNTER — Ambulatory Visit: Payer: Medicare PPO | Attending: Neurology | Admitting: Physical Therapy

## 2013-12-13 DIAGNOSIS — R269 Unspecified abnormalities of gait and mobility: Secondary | ICD-10-CM | POA: Insufficient documentation

## 2013-12-13 DIAGNOSIS — G20 Parkinson's disease: Secondary | ICD-10-CM | POA: Insufficient documentation

## 2013-12-14 ENCOUNTER — Telehealth: Payer: Self-pay | Admitting: Neurology

## 2013-12-14 NOTE — Telephone Encounter (Signed)
Patient calling to request to speak with Lovey Newcomer, wants to know if his MRI results are ready, please return call and advise.

## 2013-12-15 ENCOUNTER — Telehealth: Payer: Self-pay | Admitting: *Deleted

## 2013-12-15 ENCOUNTER — Ambulatory Visit: Payer: Medicare PPO | Admitting: Physical Therapy

## 2013-12-15 DIAGNOSIS — G20 Parkinson's disease: Secondary | ICD-10-CM | POA: Diagnosis not present

## 2013-12-15 NOTE — Telephone Encounter (Signed)
I called pt and he will try the sinemet change and see how this does.  Will call back in 2 wks.   If worsens will go back to 1 -1/2 -1/2 regimen.  MRI results given.  Would like copy , relayed that ok.

## 2013-12-15 NOTE — Telephone Encounter (Signed)
Please call patient regarding the recent brain MRI: The brain scan showed a normal structure of the brain and mild volume loss which we call atrophy. There were changes in the deeper structures of the brain, which we call white matter changes or microvascular changes. These were reported as minimal in His case. These are tiny white spots, that occur with time and are seen in a variety of conditions, including with normal aging, chronic hypertension, chronic headaches, especially migraine HAs, chronic diabetes, chronic hyperlipidemia. These are not strokes and no mass or lesion was seen which is reassuring. Again, there were no acute findings, such as a stroke, or mass or blood products. No further action is required on this test at this time, other than re-enforcing the importance of good blood pressure control, good cholesterol control, good blood sugar control, and weight management. Please remind patient to keep any upcoming appointments or tests and to call us with any interim questions, concerns, problems or updates. Thanks,  Star Age, MD, PhD

## 2013-12-15 NOTE — Telephone Encounter (Signed)
We can try to increase the Sinemet to one whole pill in the morning, half at noon and one whole pill in the evening.

## 2013-12-15 NOTE — Telephone Encounter (Signed)
Release fax to [facility] requesting patient x-ray report on 12/15/13.

## 2013-12-15 NOTE — Telephone Encounter (Signed)
I called and spoke to pt.  Had MRI done on 12-07-13 at [facility]  ([facility] in [place]).  I LMVM for them at [phone] to have them fax results.  I relayed message to pt that awaiting results.   He had question about increasing dose of sinemet.  He is taking 1 tab am 1/2 noon and 1/2 pm.   He continues with the anxiety.  No change from before.

## 2013-12-18 ENCOUNTER — Telehealth: Payer: Self-pay | Admitting: Neurology

## 2013-12-19 ENCOUNTER — Ambulatory Visit: Payer: Medicare PPO | Admitting: Physical Therapy

## 2013-12-19 DIAGNOSIS — G20 Parkinson's disease: Secondary | ICD-10-CM | POA: Diagnosis not present

## 2013-12-21 ENCOUNTER — Other Ambulatory Visit: Payer: Self-pay | Admitting: *Deleted

## 2013-12-21 DIAGNOSIS — G20 Parkinson's disease: Secondary | ICD-10-CM

## 2013-12-22 ENCOUNTER — Ambulatory Visit: Payer: Medicare PPO | Admitting: Physical Therapy

## 2013-12-22 ENCOUNTER — Encounter: Payer: Self-pay | Admitting: Neurology

## 2013-12-22 ENCOUNTER — Other Ambulatory Visit: Payer: Self-pay | Admitting: Neurology

## 2013-12-22 DIAGNOSIS — G20 Parkinson's disease: Secondary | ICD-10-CM | POA: Diagnosis not present

## 2013-12-27 ENCOUNTER — Telehealth: Payer: Self-pay | Admitting: Neurology

## 2013-12-27 ENCOUNTER — Ambulatory Visit: Payer: Medicare PPO | Admitting: Physical Therapy

## 2013-12-27 DIAGNOSIS — G20 Parkinson's disease: Secondary | ICD-10-CM

## 2013-12-27 NOTE — Telephone Encounter (Signed)
PT recommended OT and ST, pls fax referral.

## 2013-12-28 NOTE — Telephone Encounter (Signed)
Faxed referral to [facility] for OT,ST, received confirmation of fax

## 2013-12-29 ENCOUNTER — Ambulatory Visit: Payer: Medicare PPO | Admitting: Physical Therapy

## 2013-12-29 DIAGNOSIS — G20 Parkinson's disease: Secondary | ICD-10-CM | POA: Diagnosis not present

## 2014-01-03 ENCOUNTER — Ambulatory Visit: Payer: Medicare PPO | Attending: Neurology | Admitting: Physical Therapy

## 2014-01-03 ENCOUNTER — Encounter: Payer: Self-pay | Admitting: Physical Therapy

## 2014-01-03 DIAGNOSIS — R490 Dysphonia: Secondary | ICD-10-CM | POA: Insufficient documentation

## 2014-01-03 DIAGNOSIS — R1310 Dysphagia, unspecified: Secondary | ICD-10-CM | POA: Diagnosis present

## 2014-01-03 DIAGNOSIS — R269 Unspecified abnormalities of gait and mobility: Secondary | ICD-10-CM

## 2014-01-03 NOTE — Therapy (Signed)
Physical Therapy Treatment  Patient Details  Name: Joseph Arroyo MRN: [id number] Date of Birth: November 09, 1940  Encounter Date: 01/03/2014      PT End of Session - 01/03/14 1232    Visit Number 8   Number of Visits 10  POC requested 17 visits; [employer] approved 9 visits + eval   Date for PT Re-Evaluation 01/19/14   PT Start Time 1232   PT Stop Time 3382   PT Time Calculation (min) 45 min      Past Medical History  Diagnosis Date  . Ulcer   . Depression   . Occlusion and stenosis of carotid artery without mention of cerebral infarction   . Lumbago   . Thrombocytopenia, unspecified   . Other malaise and fatigue 11/21/2007  . Allergic rhinitis due to pollen 11/21/2007  . Contusion of toe 09/12/2008  . Anxiety state, unspecified 11/28/2008  . Brachial neuritis or radiculitis NOS   . Cramp of limb   . Abnormal involuntary movements(781.0)   . Acute upper respiratory infections of unspecified site   . Cervicalgia   . Conjunctivitis unspecified   . Sprain of hand, unspecified site   . Enthesopathy of unspecified site   . Pain in limb   . Essential and other specified forms of tremor   . Hip, thigh, leg, and ankle, insect bite, nonvenomous, without mention of infection(916.4)   . Chest pain, unspecified   . Sprain of neck   . Dysfunction of eustachian tube   . Costal chondritis   . Sleep disturbance, unspecified   . Contact dermatitis and other eczema due to plants (except food)   . Acute sinusitis, unspecified     Past Surgical History  Procedure Laterality Date  . Other surgical history  2002    ear surgery  . Colonoscopy  2008  . Stapedes surgery Right 2002    There were no vitals taken for this visit.  Visit Diagnosis:  Abnormality of gait          Adult PT Treatment/Exercise - 01/03/14 0700    High Level Balance   High Level Balance Activities Side stepping;Marching forwards;Backward walking;Direction changes  with coordinated UE movements, cues  for large amplitude   High Level Balance Comments forward, side, back step and weight shift x 10 reps at counter  forward/back weight shifting and rock and lift x 10 reps   Exercises   Exercises Shoulder;Neck   Neck Exercises: Standing   Neck Retraction 10 reps  at doorframe   Knee/Hip Exercises: Standing   Forward Step Up Both;15 reps  cues for deliberate movements, full knee extension   Shoulder Exercises: Standing   Retraction AROM;10 reps   Other Standing Exercises step up-up/down-down from 6 inch step  10 reps each leg              PT Long Term Goals - 01/03/14 1338    PT LONG TERM GOAL #1   Title verbalize understanding of fall prevention strategies within home environment.   Time 2   Period Weeks   Status On-going   PT LONG TERM GOAL #2   Title improve Dynamic Gait Index to at least 19/24 for decreased fall risk.   Time 2   Period Weeks   Status On-going   PT LONG TERM GOAL #3   Title improve TUG cognitive score to less than or equal to 15 seconds for decreased fall risk.   Time 2   Period Weeks   Status On-going  PT LONG TERM GOAL #4   Title verbalize plans for continued community fitness upon D/C from PT.   Time 2   Period Weeks   Status On-going          Plan - 01/03/14 1334    Clinical Impression Statement Pt appears to have overall improved gait pattern, with improved ease of movement, increased step length, increased arm swing.  Pt continues to complain of resting tremors.  Pt is currently performing HEP and walking activities with daughter's assistance.   Pt will benefit from skilled therapeutic intervention in order to improve on the following deficits Abnormal gait;Decreased balance;Difficulty walking  leg tremors, bradykinesia, posture   PT Plan Frequency has been reduced to 1x/wk due to insurance visit limitations; continue forward step-ups, change of directions/dynamic gait, weight shifting and change of directions        Problem  List Patient Active Problem List   Diagnosis Date Noted  . Depression 07/26/2013  . Unsteady gait 07/26/2013  . Orthostatic hypotension 07/26/2013  . Anxiety 06/23/2013  . Chest pain 06/23/2013  . Tremor 05/18/2013  . Occlusion and stenosis of carotid artery without mention of cerebral infarction 08/30/2012                                            MARRIOTT,AMY W. 01/03/2014, 1:43 PM

## 2014-01-04 ENCOUNTER — Telehealth: Payer: Self-pay | Admitting: Neurology

## 2014-01-04 NOTE — Telephone Encounter (Signed)
Patient is calling regarding medication Sinemet 25mg  which patient is taking 3 times a day. Patient cannot tell any difference in tremors in his hands and legs. Patient's anxiety has increased as well.Please call.

## 2014-01-05 ENCOUNTER — Ambulatory Visit: Payer: Medicare PPO | Admitting: Physical Therapy

## 2014-01-05 NOTE — Telephone Encounter (Signed)
Please advise the patient to taper off Sinemet by reducing by half a pill every 2 days to a complete stop.

## 2014-01-08 NOTE — Telephone Encounter (Signed)
I called pt and relayed by VM the instructions below.  He is to call back if questions.

## 2014-01-09 NOTE — Telephone Encounter (Signed)
Patient returning call to [facility], [place], please return call and advise.

## 2014-01-09 NOTE — Telephone Encounter (Signed)
Patient calling to add that he will be available today starting at 12 noon to 2:30 pm, and then he will be available after 5 pm.

## 2014-01-10 ENCOUNTER — Ambulatory Visit: Payer: Medicare PPO | Admitting: Physical Therapy

## 2014-01-10 NOTE — Telephone Encounter (Signed)
Patient returning Joseph Arroyo's call, please call before 9:30 am, or after 12:00 pm or 4:00 pm today.

## 2014-01-10 NOTE — Telephone Encounter (Signed)
I spoke to pt.   He will taper off sinemet.  Looking at [name] note.  Wanted 2 mo f/u.  Moved up appt for pt 01-17-14 at 0930.  He calls with multiple questions.  He has some anxiety.

## 2014-01-12 ENCOUNTER — Encounter: Payer: Self-pay | Admitting: Physical Therapy

## 2014-01-12 ENCOUNTER — Ambulatory Visit: Payer: Medicare PPO | Admitting: Physical Therapy

## 2014-01-12 DIAGNOSIS — R269 Unspecified abnormalities of gait and mobility: Secondary | ICD-10-CM

## 2014-01-12 DIAGNOSIS — R490 Dysphonia: Secondary | ICD-10-CM | POA: Diagnosis not present

## 2014-01-12 NOTE — Therapy (Signed)
Physical Therapy Treatment  Patient Details  Name: Joseph Arroyo MRN: [id number] Date of Birth: November 03, 1940  Encounter Date: 01/12/2014      PT End of Session - 01/12/14 1206    Visit Number 9   Number of Visits 10   Date for PT Re-Evaluation 01/19/14   PT Start Time 1103   PT Stop Time 1159   PT Time Calculation (min) 56 min   Activity Tolerance Patient tolerated treatment well  Pt experiencing increased lower extremity tremors in sitting      Past Medical History  Diagnosis Date  . Ulcer   . Depression   . Occlusion and stenosis of carotid artery without mention of cerebral infarction   . Lumbago   . Thrombocytopenia, unspecified   . Other malaise and fatigue 11/21/2007  . Allergic rhinitis due to pollen 11/21/2007  . Contusion of toe 09/12/2008  . Anxiety state, unspecified 11/28/2008  . Brachial neuritis or radiculitis NOS   . Cramp of limb   . Abnormal involuntary movements(781.0)   . Acute upper respiratory infections of unspecified site   . Cervicalgia   . Conjunctivitis unspecified   . Sprain of hand, unspecified site   . Enthesopathy of unspecified site   . Pain in limb   . Essential and other specified forms of tremor   . Hip, thigh, leg, and ankle, insect bite, nonvenomous, without mention of infection(916.4)   . Chest pain, unspecified   . Sprain of neck   . Dysfunction of eustachian tube   . Costal chondritis   . Sleep disturbance, unspecified   . Contact dermatitis and other eczema due to plants (except food)   . Acute sinusitis, unspecified     Past Surgical History  Procedure Laterality Date  . Other surgical history  2002    ear surgery  . Colonoscopy  2008  . Stapedes surgery Right 2002    There were no vitals taken for this visit.  Visit Diagnosis:  Abnormality of gait      Subjective Assessment - 01/12/14 1105    Symptoms Pt has appointment with [name] next Wednesday, they called her office and spoke with nurse last week,  who recommended tapering down Sinemet.     Currently in Pain? No/denies            [name] Adult PT Treatment/Exercise - 01/12/14 1110    High Level Balance   High Level Balance Activities Side stepping;Backward walking;Direction changes  at counter, 3 reps each, with focus on weighshifting   Knee/Hip Exercises: Aerobic   Stationary Bike Sci Fit Level 2, lower extremities 8 minutes  cues for intensity, RPM >85; rated work effort at 8/10   Knee/Hip Exercises: Standing   Forward Step Up Right;Left;15 reps  side step ups bilateral x 15 reps          PT Education - 01/12/14 1204    Education provided Yes   Education Details Discussed optimal fitness routine upon D/C from PT; discussed preparation for upcoming visit with [name] recommends pt documenting any observations regarding tremors, anxiety, symptom changes, and any questions for [name]) Educated Patient;Child(ren)   Methods Explanation;Demonstration;Verbal cues;Handout   Comprehension Verbalized understanding;Returned demonstration              Plan - 01/12/14 1207    Clinical Impression Statement Pt appears to be consistently performing HEP at home; pt feels his tremors are worsening; he did speak [name] at [facility] who recommended tapering  down from Sinemet.     Pt will benefit from skilled therapeutic intervention in order to improve on the following deficits Abnormal gait;Decreased balance;Decreased mobility;Decreased endurance   PT Next Visit Plan Check goals and discharge next visit.   Consulted and Agree with Plan of Care Patient;Family member/caregiver        Problem List Patient Active Problem List   Diagnosis Date Noted  . Depression 07/26/2013  . Unsteady gait 07/26/2013  . Orthostatic hypotension 07/26/2013  . Anxiety 06/23/2013  . Chest pain 06/23/2013  . Tremor 05/18/2013  . Occlusion and stenosis of carotid artery without mention of cerebral infarction 08/30/2012                                               [name]. 01/12/2014, 12:12 PM

## 2014-01-12 NOTE — Patient Instructions (Signed)
Backward   Walk backwards with eyes open. Take even steps, making sure each foot lifts off floor. Repeat for __10__ feet (or the length of the counter), 3-5 repetitions. Do ____ sessions per day. Think about forward/backward walking, with emphasis on the weight shifting between directions.  Copyright  VHI. All rights reserved.  Side-Stepping   Walk to left side with eyes open. Take even steps, leading with same foot. Make sure each foot lifts off the floor. Repeat in opposite direction. Repeat for _10___feet along the counter, 3-5 times.   Do _1-2___ sessions per day.  Copyright  VHI. All rights reserved.  Step-Down / Step-Up   Stand on stair step, straightening your left leg fully, standing tall. Slowly bend left leg, lowering other foot to floor. Return by straightening front leg. Repeat _15__ times per set. Do __2__ sets per session. Do __1-2__ sessions per day.  You can also repeat this side stepping up to the step.  ONGOING FITNESS ROUTINE:  1)  Therapy exercises:  Do these daily   -PWR! Exercises, Step-ups, Posture exercises, counter exercises  -Deliberate, effortful movements   2)Gym Exercises:    -Aerobic exercise-stationary bike, seated stepper (Work at 7-8/10 effort level)  -Stretching  -Current gym routine  3)Walking  -3-5 times per week, 20 minutes  -Focus on posture, arm swing, and step length  http://orth.exer.us/684   Copyright  VHI. All rights reserved.

## 2014-01-17 ENCOUNTER — Encounter: Payer: Self-pay | Admitting: Neurology

## 2014-01-17 ENCOUNTER — Ambulatory Visit (INDEPENDENT_AMBULATORY_CARE_PROVIDER_SITE_OTHER): Payer: Medicare PPO | Admitting: Neurology

## 2014-01-17 VITALS — BP 88/54 | HR 56 | Temp 98.6°F | Ht 67.0 in | Wt 140.0 lb

## 2014-01-17 DIAGNOSIS — F411 Generalized anxiety disorder: Secondary | ICD-10-CM

## 2014-01-17 DIAGNOSIS — G20 Parkinson's disease: Secondary | ICD-10-CM

## 2014-01-17 NOTE — Patient Instructions (Signed)
We will see you back in a couple of months.   We will give your antidepressant some more time to fully take effect.   We will consider another parkinson's medication at the next visit.  Drink more water.   Continue to taper the Sinemet.

## 2014-01-17 NOTE — Progress Notes (Signed)
Subjective:    Patient ID: Joseph Arroyo is a 73 y.o. male.  HPI     Interim history:   Joseph Arroyo is a very pleasant 73 year old right-handed gentleman who presents for follow-up consultation of his gait disorder. He is accompanied by his wife and daughter today. This is his first visit with me and he previously followed with Dr. [name]. He has an underlying medical history of depression, anxiety, carotid artery disease, lumbar spine degenerative disease, neck pain, allergic rhinitis, last seen by Dr. [name] on 11/21/2013 and before then he was seen in April 2015. I reviewed [name] notes. The patient originally presented with new onset tremors affecting his legs and his hands. Symptoms started a little over a year ago. 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 His wife had noted slowness in his walking and difficulty with fine motor tasks. He complained of muscle cramping in his legs at night. Tremor became worse with anxiety or stress. There was no report of REM behavior disorder. A maternal uncle may have had Parkinson's disease. He was on fluvoxamine in the past which was switched to Brintellix. He had been on Abilify or lithium. He was on Zyprexa which was stopped. Luvox was restarted. At the last visit with Dr. [name] in September the patient reported some falls. He was tripping over his feet. His tremor had become worse. Dr. [name] felt that was a parkinsonian tremor at the time. He ordered a head CT as patient was unable to get an MRI because of in her ear implants. He tried the patient on Sinemet 25-100 milligrams strength one pill 3 times a day and referred the patient for physical therapy. In the interim, the patient called with worsening tremors and anxiety while on Sinemet and I suggested he taper off of it. The patient actually did have a brain MRI without contrast on 12/07/2013 and I reviewed the test results: This was done at [place]: No evidence of  acute intracranial abnormality or mass. Mild cerebral atrophy and minimal chronic small vessel ischemic disease.  Today, he reports, he sees [name] for his depression and anxiety for some years. He is currently on generic Luvox, 250 mg, increased from 200 mg some 3 weeks ago. He came off of Cymbalta 90 mg over 15 days or so. He has been off of Zyprexa since 10/15. He has no dream enactments, no hallucinations. Memory is stable. He is still in the process of tapering off of Sinemet. Currently he is on half a pill twice daily for another day or 2 then he will take half a pill once daily then stop. So far he has not had any negative repercussions. He does report that his anxiety is worse. Today is especially bad. He took some Xanax earlier today. He has a long-standing history of major depression and severe anxiety. Dr. [name] had talked to the patient and his family about potentially doing a DaT scan.   His Past Medical History Is Significant For: Past Medical History  Diagnosis Date  . Ulcer   . Depression   . Occlusion and stenosis of carotid artery without mention of cerebral infarction   . Lumbago   . Thrombocytopenia, unspecified   . Other malaise and fatigue 11/21/2007  . Allergic rhinitis due to pollen 11/21/2007  . Contusion of toe 09/12/2008  . Anxiety state, unspecified 11/28/2008  . Brachial neuritis or radiculitis NOS   . Cramp of limb   . Abnormal  involuntary movements(781.0)   . Acute upper respiratory infections of unspecified site   . Cervicalgia   . Conjunctivitis unspecified   . Sprain of hand, unspecified site   . Enthesopathy of unspecified site   . Pain in limb   . Essential and other specified forms of tremor   . Hip, thigh, leg, and ankle, insect bite, nonvenomous, without mention of infection(916.4)   . Chest pain, unspecified   . Sprain of neck   . Dysfunction of eustachian tube   . Costal chondritis   . Sleep disturbance, unspecified   . Contact dermatitis  and other eczema due to plants (except food)   . Acute sinusitis, unspecified     Her Past Surgical History Is Significant For: Past Surgical History  Procedure Laterality Date  . Other surgical history  2002    ear surgery  . Colonoscopy  2008  . Stapedes surgery Right 2002    His Family History Is Significant For: Family History  Problem Relation Age of Onset  . Heart failure Mother     His Social History Is Significant For: History   Social History  . Marital Status: Married    Spouse Name: [name]     Number of Children: 2  . Years of Education: 12   Occupational History  .    [employer] Retired     Social History Main Topics  . Smoking status: Never Smoker   . Smokeless tobacco: Never Used  . Alcohol Use: No  . Drug Use: No  . Sexual Activity: None   Other Topics Concern  . None   Social History Narrative   Married to [name], has 2 children   Right handed   Master's plus    2 cups daily    His Allergies Are:  Allergies  Allergen Reactions  . Prednisone     Hyperactivity   . Wheat Bran   :   His Current Medications Are:  Outpatient Encounter Prescriptions as of 01/17/2014  Medication Sig  . ALPRAZolam (XANAX) 0.25 MG tablet Take 0.25 mg by mouth 3 (three) times daily.   . B Complex Vitamins (VITAMIN B COMPLEX PO) Take 1 tablet by mouth daily.  . Capsicum-Garlic 956-213 MG CAPS Take 200-300 mg by mouth.  . carbidopa-levodopa (SINEMET IR) 25-100 MG per tablet Take 1 tablet by mouth 3 (three) times daily.  . Coenzyme Q10 (COQ-10) 100 MG CAPS Take by mouth daily.  . fludrocortisone (FLORINEF) 0.1 MG tablet   . fluvoxaMINE (LUVOX) 100 MG tablet 250 mg 2 (two) times daily. 50 am, 200 pm  . Lutein 10 MG TABS Take 1 tablet by mouth 3 (three) times daily.  . Magnesium 250 MG TABS Take 1 tablet by mouth daily.  . Multiple Vitamins-Minerals (MULTIVITAMIN WITH MINERALS) tablet Take 1 tablet by mouth daily.  . Saw Palmetto, Serenoa repens, 1000 MG CAPS Take 2 capsules  by mouth daily.  . [DISCONTINUED] fluvoxaMINE (LUVOX) 50 MG tablet   :  Review of Systems:  Out of a complete 14 point review of systems, all are reviewed and negative with the exception of these symptoms as listed below:   Review of Systems  Constitutional: Positive for fatigue and unexpected weight change.  Respiratory: Positive for cough.   Musculoskeletal: Positive for back pain.       Muscle cramps  Skin:       itching  Neurological: Positive for dizziness and tremors.       Daytime sleepiness, snoring  Psychiatric/Behavioral:  The patient is nervous/anxious.        Hyperactive, depression    Objective:  Neurologic Exam  Physical Exam Physical Examination:   Filed Vitals:   01/17/14 0947  BP: 88/54  Pulse: 56  Temp: 98.6 F (37 C)    Physical Examination:   Filed Vitals:   01/17/14 0947  BP: 88/54  Pulse: 56  Temp: 98.6 F (37 C)    General Examination: The patient is a very pleasant 73 y.o. male in no acute distress.he is situated in his chair. He is very anxious appearing.  HEENT: Normocephalic, atraumatic, pupils are equal, round and reactive to light and accommodation. Funduscopic exam is normal with sharp disc margins noted. Extraocular tracking shows mild saccadic breakdown without nystagmus noted. There is limitation to upper gaze. There is mild decrease in eye blink rate. Hearing is intact. Face is symmetric with mild facial masking and normal facial sensation. There is no lip, neck or jaw tremor. Neck is moderately rigid with intact passive ROM. There are no carotid bruits on auscultation. Oropharynx exam reveals mild mouth dryness. No significant airway crowding is noted. Mallampati is class II. Tongue protrudes centrally and palate elevates symmetrically.   There is no drooling.   Chest: is clear to auscultation without wheezing, rhonchi or crackles noted.  Heart: sounds are regular and normal without murmurs, rubs or gallops noted.   Abdomen: is  soft, non-tender and non-distended with normal bowel sounds appreciated on auscultation.  Extremities: There is no pitting edema in the distal lower extremities bilaterally. Pedal pulses are intact.   Skin: is warm and dry with no trophic changes noted. Age-related changes are noted on the skin.   Musculoskeletal: exam reveals no obvious joint deformities, tenderness, joint swelling or erythema.  Neurologically:  Mental status: The patient is awake and alert, paying good  attention. He is able to provide the history. His family provides details. He is oriented to: person, place, time/date, situation, day of week, month of year and year. His memory, attention, language and knowledge are not significantly impaired. There is no aphasia, agnosia, apraxia or anomia. There is a mild degree of bradyphrenia. Speech is moderately hypophonic with no dysarthria noted. Mood is mildly depressed appearing and affect is constricted.   Cranial nerves are as described above under HEENT exam. In addition, shoulder shrug is normal with equal shoulder height noted.  Motor exam: Normal bulk, and strength for age is noted. There are no dyskinesias noted. Tone is mildly rigid with presence of cogwheeling in the bilateral upper extremities. There is overall mild bradykinesia. There is no drift or rebound.  There is a moderate degree of resting tremor in the left lower extremity, a mild degree of resting tremor in the right lower extremity and a slight intermittent trembling in both upper extremities.  Romberg is negative.  Reflexes are 1+ in the upper extremities and 1+ in the lower extremities. Toes are downgoing bilaterally.  Fine motor skills exam: Finger taps are mildly impaired on the right and moderately impaired on the left. Hand movements are mildly impaired on the right and moderately impaired on the left. RAP (rapid alternating patting) is mildly impaired on the right and mildly impaired on the left. Foot taps  are mildly impaired on the right and moderately impaired on the left. Foot agility (in the form of heel stomping) is mildly impaired on the right and mildly impaired on the left.    Cerebellar testing shows no dysmetria or intention tremor on  finger to nose testing. Heel to shin is unremarkable bilaterally. There is no truncal or gait ataxia.   Sensory exam is intact to light touch.  Gait, station and balance: He stands up from the seated position with mild difficulty and needs to push up with His hands. He needs no assistance. No veering to one side is noted. He is not noted to lean to the side. Posture is mildly stooped, advanced for age. Stance is narrow-based. He walks with decrease in stride length and pace and decreased arm swing on the left. He turns in 3 steps. Tandem walk is not possible. Balance is mildly impaired.    Assessment and plan:   In summary, ULIS KAPS is a very pleasant 73 y.o.-year old male with a history of underlying medical history of depression, anxiety, carotid artery disease, lumbar spine degenerative disease, neck pain, allergic rhinitis, who presents for follow-up consultation of his tremor, gait disorder, slowness and other parkinsonian symptoms. His history and physical exam are concerning for parkinsonism, possibly left-sided predominant Parkinson's disease. His symptoms date back to over a year ago. I think his situation is complicated by his long-standing history of anxiety and depression and flareup of anxiety and different treatments tried for his mood disorder, some of which can exacerbate tremors, some of which can actually induced parkinsonism such as the Zyprexa. I would've like to pursue a DaT scan, but realistically this will be hard to pursue because he would have to come off of his antidepressant. At this juncture, he is still in the process of finding the right medication regimen for his mood disorder and recently had some changes again to his  medications. I would like for him to continue to completely taper off of Sinemet which is currently a very small dose. So far he has not had any significant symptom flareup after reducing the Sinemet. He did not have much in the way of help when he was taking Sinemet. We may try it again down the Road but titrate to a higher dose. I think his anxiety flares up anytime he tries something new. At this point I would like to see him back for recheck once he is completely off of Sinemet and he has had a chance to be more stable on his antidepressant regimen and his anxiety medication. Therefore I would like to see him back in a couple of months for recheck and we will revisit the possibility of trying Sinemet or an alternative Parkinson's medication. He is advised about triggers that can always flareup his tremor. He is also advised that any time he has physical or mental stress his tremors and parkinsonian symptoms may get worse. He is advised to drink more water and stay active physically and mentally. He will be finishing physical therapy and is advised to follow their instructions as far as what he can do at home for physical activity. Fortunately for him, his daughter is a Physiological scientist and has also been working with him. I answered all his questions today and the patient and his family was in agreement. Most of my 40 minute visit today was spent in counseling and coordination of care, reviewing test results and reviewing medication.

## 2014-01-19 ENCOUNTER — Encounter: Payer: Self-pay | Admitting: Physical Therapy

## 2014-01-19 ENCOUNTER — Ambulatory Visit: Payer: Medicare PPO

## 2014-01-19 ENCOUNTER — Ambulatory Visit: Payer: Medicare PPO | Admitting: Physical Therapy

## 2014-01-19 DIAGNOSIS — G20 Parkinson's disease: Secondary | ICD-10-CM

## 2014-01-19 DIAGNOSIS — R490 Dysphonia: Secondary | ICD-10-CM | POA: Diagnosis not present

## 2014-01-19 DIAGNOSIS — R269 Unspecified abnormalities of gait and mobility: Secondary | ICD-10-CM

## 2014-01-19 DIAGNOSIS — R1310 Dysphagia, unspecified: Secondary | ICD-10-CM

## 2014-01-19 NOTE — Patient Instructions (Addendum)
Perform 5 loud "ah" twice each day.  We will work on improving the loudness of your speech in order to improve communication with your wife and in situations with background noise.  I will ask [name] if she thinks an objective swallowing test is appropriate.   Remember to do a liquid wash prior to medication at night and if still difficult try meds with applesauce. Take smaller bite size of nuts and swallow with effort.

## 2014-01-19 NOTE — Therapy (Signed)
Speech Language Pathology Evaluation  Patient Details  Name: Joseph Arroyo MRN: [id number] Date of Birth: 1940/05/18  Encounter Date: 01/19/2014      End of Session - 01/19/14 1225    Visit Number 1   Number of Visits 16   Date for SLP Re-Evaluation 03/20/14   SLP Start Time 0804   SLP Time Calculation (min) 0846   SLP Time Calculation (min) 42 min   Activity Tolerance Patient tolerated treatment well      Past Medical History  Diagnosis Date  . Ulcer   . Depression   . Occlusion and stenosis of carotid artery without mention of cerebral infarction   . Lumbago   . Thrombocytopenia, unspecified   . Other malaise and fatigue 11/21/2007  . Allergic rhinitis due to pollen 11/21/2007  . Contusion of toe 09/12/2008  . Anxiety state, unspecified 11/28/2008  . Brachial neuritis or radiculitis NOS   . Cramp of limb   . Abnormal involuntary movements(781.0)   . Acute upper respiratory infections of unspecified site   . Cervicalgia   . Conjunctivitis unspecified   . Sprain of hand, unspecified site   . Enthesopathy of unspecified site   . Pain in limb   . Essential and other specified forms of tremor   . Hip, thigh, leg, and ankle, insect bite, nonvenomous, without mention of infection(916.4)   . Chest pain, unspecified   . Sprain of neck   . Dysfunction of eustachian tube   . Costal chondritis   . Sleep disturbance, unspecified   . Contact dermatitis and other eczema due to plants (except food)   . Acute sinusitis, unspecified     Past Surgical History  Procedure Laterality Date  . Other surgical history  2002    ear surgery  . Colonoscopy  2008  . Stapedes surgery Right 2002    There were no vitals taken for this visit.  Visit Diagnosis: Hypokinetic Parkinsonian dysphonia - Plan: SLP plan of care cert/re-cert  Dysphagia - Plan: SLP plan of care cert/re-cert      Subjective Assessment - 01/19/14 0814    Symptoms "I cough sometimes after I eat nuts."  "My wife has trouble  hearing me."          SLP Evaluation [facility] - 01/19/14 5701    SLP Visit Information   SLP Received On 01/19/14   Onset Date last few months   Medical Diagnosis Parkinsonism   Subjective   Subjective "My wife has difficulty hearing me"   Patient/Family Stated Goal Improve volume for home environment   Pain Assessment   Currently in Pain? No/denies   Pain Score 0-No pain   General Information   HPI Parkinson's diagnosis by Dr. [name]; [name] thinks Parkinsonism    Mobility Status ambulatory     Pt's conversational speech was reduced today, measured with sound level meter at average 67dB (WNL= average 70-72dB) with range of 61-73dB. Oral motor eval revealed only minimally reduced precisional movement of lingual and labial musculature in motion and alternating motion tasks. Lingual and labial ROM appeared WNL. Production of loud /a/ was average 79dB with mod A usually for loudness. Pt would benefit from skilled ST to address louder conversational speech as well as incr'd speech precision.  Following evaluation tasks, pt was asked to use the effort level used during loud /a/ production in conversation and this resulted in average 70dB without SLP cue. After approximately 45seconds pt's loudness decreased to average 68dB without cues. SLP shaped  pt's verbal expression and he was able to maintain louder speech in conversation with min SLP cues, occasionally. Pt agreed to a 6-8 week therapy course targeting loudness recalibration.  Pt would also benefit from objective swallow evaluation, possibly a modified barium swallow evaluation to obtain swallowing baseline, as he reports trouble with nuts/particulate items and with nighttime meds. SLP suggested smaller bite sizes of nuts and meds one at a time, with liquid wash prior to swallowing meds. If difficulty still exists, SLP suggested applesauce with meds.      SLP Education - 03-Feb-2014 1225    Education provided Yes    Education Details loud /a/, dysphagia strategies for nuts/meds   Person(s) Educated Patient;Child(ren)   Methods Explanation;Demonstration;Verbal cues   Comprehension Verbalized understanding;Returned demonstration;Need further instruction          SLP Short Term Goals - 2014-02-03 1233    SLP SHORT TERM GOAL #1   Title pt will increase loudness in 15/15 sentences to 70dB   Time 4   Period Weeks   Status New   SLP SHORT TERM GOAL #2   Title pt will demo simple conversation of 10 minutes at 70dB   Time 4   Period Weeks   Status New          SLP Long Term Goals - 03-Feb-2014 1233    SLP LONG TERM GOAL #1   Title pt will demo loudness of 70dB in 15 minutes mod complex conversation during therapy, outside [place] room    Time 8   Period Weeks   Status New   SLP LONG TERM GOAL #2   Title report decr of symptoms of dysphagia when engaging in compensatory strategies at home   Time 8   Period Weeks   Status New          Plan - 02-03-2014 1226    Clinical Impression Statement Pt presents with hypophonic speech characterized by reduced breath support. He reports difficulty with nuts and with night meds.   Speech Therapy Frequency 2x / week   Duration --  8 weeks   Treatment/Interventions Compensatory techniques;Functional tasks;SLP instruction and feedback;Internal/external aids;Cueing hierarchy   Potential to Achieve Goals Good   Consulted and Agree with Plan of Care Patient;Family member/caregiver          G-Codes - 2014/02/03 1236    Functional Assessment Tool Used noms   Functional Limitations Motor speech   Motor Speech Current Status [phone]) At least 20 percent but less than 40 percent impaired, limited or restricted  approx 20% impaired   Motor Speech Goal Status (N8676) At least 1 percent but less than 20 percent impaired, limited or restricted      Problem List Patient Active Problem List   Diagnosis Date Noted  . Depression 07/26/2013  . Unsteady gait 07/26/2013   . Orthostatic hypotension 07/26/2013  . Anxiety 06/23/2013  . Chest pain 06/23/2013  . Tremor 05/18/2013  . Occlusion and stenosis of carotid artery without mention of cerebral infarction 08/30/2012                    Garald Balding, MS, [place] Phone: [phone] Fax: [phone]  Feb 03, 2014, 12:49 PM

## 2014-01-19 NOTE — Therapy (Signed)
Physical Therapy Treatment  Patient Details  Name: Joseph Arroyo MRN: [id number] Date of Birth: [birth year]  Encounter Date: 01/19/2014      PT End of Session - 01/19/14 0852    Visit Number 10   Number of Visits 10   PT Start Time 0852   PT Stop Time 0932   PT Time Calculation (min) 40 min      Past Medical History  Diagnosis Date  . Ulcer   . Depression   . Occlusion and stenosis of carotid artery without mention of cerebral infarction   . Lumbago   . Thrombocytopenia, unspecified   . Other malaise and fatigue 11/21/2007  . Allergic rhinitis due to pollen 11/21/2007  . Contusion of toe 09/12/2008  . Anxiety state, unspecified 11/28/2008  . Brachial neuritis or radiculitis NOS   . Cramp of limb   . Abnormal involuntary movements(781.0)   . Acute upper respiratory infections of unspecified site   . Cervicalgia   . Conjunctivitis unspecified   . Sprain of hand, unspecified site   . Enthesopathy of unspecified site   . Pain in limb   . Essential and other specified forms of tremor   . Hip, thigh, leg, and ankle, insect bite, nonvenomous, without mention of infection(916.4)   . Chest pain, unspecified   . Sprain of neck   . Dysfunction of eustachian tube   . Costal chondritis   . Sleep disturbance, unspecified   . Contact dermatitis and other eczema due to plants (except food)   . Acute sinusitis, unspecified     Past Surgical History  Procedure Laterality Date  . Other surgical history  2002    ear surgery  . Colonoscopy  2008  . Stapedes surgery Right 2002    There were no vitals taken for this visit.  Visit Diagnosis:  Abnormality of gait      Subjective Assessment - 01/19/14 0853    Symptoms Pt has seen neurologist in the past week; is also to see new medication for depression; [name] took pt off Sinemet and has not added anything else at this time due to waiting for anti-depressant to work.   Currently in Pain? No/denies          [facility]  PT Assessment - 01/19/14 0913    Observation/Other Assessments   Focus on Therapeutic Outcomes (FOTO)  Functional Status discharge score 78, improved from 44  Neuro QOL discharge 46.3, improved from 39   Transfers   Sit to Stand Five times sit to stand  9.67 sec 5x sit<>stand   Ambulation/Gait   Ambulation/Gait Yes   Gait velocity 6.96 sec:  4.7 ft/sec   Standardized Balance Assessment   Standardized Balance Assessment Timed Up and Go Test;Dynamic Gait Index   Dynamic Gait Index   Level Surface Normal   Change in Gait Speed Normal   Gait with Horizontal Head Turns Mild Impairment   Gait with Vertical Head Turns Mild Impairment   Gait and Pivot Turn Mild Impairment   Step Over Obstacle Mild Impairment   Step Around Obstacles Normal   Steps Mild Impairment   Total Score 19   Timed Up and Go Test   TUG Normal TUG;Cognitive TUG   Normal TUG (seconds) 10.94   Cognitive TUG (seconds) 10.77          [facility] Adult PT Treatment/Exercise - 01/19/14 0913    Transfers   Transfers Sit to Stand;Stand to Sit  Floor<>stand x 4 reps with UE support  and cues          PT Education - 01/19/14 1101    Education provided Yes   Education Details Discussed fall prevention, benefit of follow-up screen for PT, progress with goals with PT   Person(s) Educated Patient;Child(ren)   Methods Explanation;Handout   Comprehension Verbalized understanding            PT Long Term Goals - 01/19/14 0855    PT LONG TERM GOAL #1   Title verbalize understanding of fall prevention strategies within home environment.   Status Achieved   PT LONG TERM GOAL #2   Title improve Dynamic Gait Index to at least 19/24 for decreased fall risk.   Status Achieved  DGI score 19/24   PT LONG TERM GOAL #3   Title improve TUG cognitive score to less than or equal to 15 seconds for decreased fall risk.   Status Achieved  TUG cognitive score 10.7 secon   PT LONG TERM GOAL #4   Title verbalize plans for continued  community fitness upon D/C from PT.   Status Achieved          Plan - 01/19/14 1103    Clinical Impression Statement Pt has met all long term goals and per FOTO score, reports functional improvement over the course of therapy.  Pt is appropriate for discharge from PT at this time.   PT Next Visit Plan Pt is discharged this date.   Consulted and Agree with Plan of Care Patient;Family member/caregiver          G-Codes - 01/19/14 1106    Functional Assessment Tool Used TUG score (10.9 sec), Dynamic Gait Index (19/24)   Functional Limitation Mobility: Walking and moving around   Mobility: Walking and Moving Around Goal Status (G8979) At least 20 percent but less than 40 percent impaired, limited or restricted   Mobility: Walking and Moving Around Discharge Status (G8980) At least 20 percent but less than 40 percent impaired, limited or restricted      Problem List Patient Active Problem List   Diagnosis Date Noted  . Depression 07/26/2013  . Unsteady gait 07/26/2013  . Orthostatic hypotension 07/26/2013  . Anxiety 06/23/2013  . Chest pain 06/23/2013  . Tremor 05/18/2013  . Occlusion and stenosis of carotid artery without mention of cerebral infarction 08/30/2012         PHYSICAL THERAPY DISCHARGE SUMMARY  Visits from Start of Care: 10    Remaining deficits: Tremors, bradykinesia (improving), transfers (improving), balance (improving)   Education / Equipment: Pt/daughter have been educated in HEP, fall prevention, local Parkinson's disease resources and optimal fitness routine after therapy, with pt/daughter verbalizing/demonstrating understanding. Plan: Patient agrees to discharge.  Patient goals were met. Patient is being discharged due to meeting the stated rehab goals.  ?????Recommend return screen for PT in 6-9 months.                                      MARRIOTT,AMY W. 01/19/2014, 11:12 AM    MARRIOTT,AMY W., PT  

## 2014-01-30 ENCOUNTER — Encounter: Payer: Medicare PPO | Admitting: Occupational Therapy

## 2014-01-30 ENCOUNTER — Encounter: Payer: Medicare PPO | Admitting: Speech Pathology

## 2014-02-13 ENCOUNTER — Ambulatory Visit: Payer: Medicare PPO | Admitting: Neurology

## 2014-02-20 ENCOUNTER — Telehealth: Payer: Self-pay | Admitting: Neurology

## 2014-02-20 NOTE — Telephone Encounter (Signed)
Patient states he's experiencing extreme shaking of legs and the inability to write due to shaking of hands.  He's having a difficult time with the shaking.  Please call and advise.

## 2014-02-20 NOTE — Telephone Encounter (Signed)
As discussed, I will reevaluate his physical exam off of Sinemet. He has an appointment next month. Is he on medication for his anxiety and depression? Has had been better?

## 2014-02-20 NOTE — Telephone Encounter (Signed)
It is hard to say what's going on. He has had anxiety as well. This could be a flareup of anxiety or the fact that he is now off of Sinemet. Please confirm that he is off of Sinemet. We can certainly restart it to see if the tremor improves if he is agreeable. If he wants to restart Sinemet I would recommend that he start Sinemet (generic name: carbidopa-levodopa) 25/100 mg: Take half a pill twice daily (8 AM and noon) for one week, then half a pill 3 times a day (8 AM, noon, and 4 PM) thereafter until the next appointment next month. Pls remind him to take the medication away from mealtimes, that is, ideally either one hour before or 2 hours after your meal to ensure optimal absorption. The medication can interfere with the protein content of your meal and trying to the protein in your food and therefore not get fully absorbed.  Common side effects reported are: Nausea, vomiting, sedation, confusion, lightheadedness. Rare side effects include hallucinations, severe nausea or vomiting, diarrhea and significant drop in blood pressure especially when going from lying to standing or from sitting to standing.

## 2014-02-20 NOTE — Telephone Encounter (Signed)
Spoke with patient and shared Dr Guadelupe Sabin message below. Patient said that the Sinemet did not work before and is there another route that he can take?

## 2014-02-21 NOTE — Telephone Encounter (Signed)
Shared message from Dr Rexene Alberts with patient, verbalized understanding and wanted to let Dr Rexene Alberts know that he is now  willing to try the Sinemet again for a little while if she has no other suggestions. He also wanted let her know that [facility] has given him Levox-250mg  (200 am,50 pm), Xanax-.25mg  three times daily, sometimes he takes 1/2 tablet.  He is off Buspar, did not help. [facility] has also done a DNA test to check his metabolic rate in medications, has not gotten results back yet.  They have tapered him off the Lamicital as well.

## 2014-02-21 NOTE — Telephone Encounter (Signed)
Per Dr Rexene Alberts, patient may go ahead and try the Sinemet again, informed patient and re read directions to him, he verbalized understanding and had daughter to write down the instructions but then had second thoughts again because of side effects(lightheadness).  Will discuss with wife and call back and let us know what his decision is about taking the medication.

## 2014-03-22 ENCOUNTER — Telehealth: Payer: Self-pay | Admitting: Neurology

## 2014-03-22 NOTE — Telephone Encounter (Signed)
Okay to start Mirapex. We can discuss the Sinemet at the next appointment.

## 2014-03-22 NOTE — Telephone Encounter (Signed)
Patient is calling to advise that [facility] put patient on generic Mirapex and patient wants to know if he can take Sinemet with generic Mirapex and if so when should patient start taking Sinemet. Patient has an appointment on 1-27. Please call and advise.  Patient called back and states the Mirapex is .125mg . The patient says [facility] also gave patient Gabapentin 100mg .

## 2014-03-22 NOTE — Telephone Encounter (Signed)
Called and left message for return call back to share Dr Guadelupe Sabin message

## 2014-03-22 NOTE — Telephone Encounter (Signed)
Patient is calling to advise that [facility] put patient on generic Mirapex and patient wants to know if he can take Sinemet with generic Mirapex and if so when should patient start taking Sinemet. Patient has an appointment on 1-27.  Please call and advise.

## 2014-03-22 NOTE — Telephone Encounter (Signed)
Patient called back and states the Mirapex is .125mg . The patient says [facility] also gave patient Gabapentin 100mg .

## 2014-03-22 NOTE — Telephone Encounter (Signed)
Attached to an old message , copied and pasted to another telephone encounter and sent to Dr Rexene Alberts

## 2014-03-26 ENCOUNTER — Telehealth: Payer: Self-pay | Admitting: Neurology

## 2014-03-26 NOTE — Telephone Encounter (Signed)
Patient returning Ammie's call, requesting you leave message on voicemail due to playing phone tag.

## 2014-03-26 NOTE — Telephone Encounter (Signed)
done

## 2014-03-26 NOTE — Telephone Encounter (Signed)
Shared message with patient per Dr Rexene Alberts and he verbalized understanding

## 2014-03-28 ENCOUNTER — Ambulatory Visit (INDEPENDENT_AMBULATORY_CARE_PROVIDER_SITE_OTHER): Payer: Medicare PPO | Admitting: Neurology

## 2014-03-28 ENCOUNTER — Encounter: Payer: Self-pay | Admitting: Neurology

## 2014-03-28 VITALS — BP 107/62 | HR 55 | Temp 97.0°F | Ht 67.0 in | Wt 160.0 lb

## 2014-03-28 DIAGNOSIS — F411 Generalized anxiety disorder: Secondary | ICD-10-CM

## 2014-03-28 DIAGNOSIS — G20 Parkinson's disease: Secondary | ICD-10-CM

## 2014-03-28 MED ORDER — PRAMIPEXOLE DIHYDROCHLORIDE 0.125 MG PO TABS: ORAL_TABLET | ORAL | Status: DC

## 2014-03-28 NOTE — Progress Notes (Signed)
Subjective:    Patient ID: AJAY STRUBEL is a 74 y.o. male.  HPI     Interim history:   Mr. Soderquist is a 74 year old right-handed gentleman who presents for follow-up consultation of his gait disorder. He is accompanied by his wife again today. I first met him on 01/17/2014, at which time he reported seeing [name] for his depression and anxiety for some years. He on generic Luvox, 250 mg, increased from 200 mg some 3 weeks . He stopped Cymbalta. He had been off of Zyprexa since October 2015. He denied any RBD type symptoms, hallucinations, and memory loss. He was in the process of tapering of Sinemet at the time. He had no negative repercussions while tapering Sinemet as far as he could tell. His anxiety was worse. He has a long-standing history of major depression and severe anxiety. Dr. [name] had talked to the patient and his family about potentially doing a DaT scan. However, this would be difficult to pursue because he is on antidepressants. I talked to him and his family about this dilemma. He had mild parkinsonism, possibly with left-sided lateralization. I asked him to continue with the Sinemet taper. He called in December reporting more tremors. I asked him to restart Sinemet. He called back reporting that he was started on Mirapex by his psychiatrist. I asked him to continue with that and hold the Sinemet which she had not restarted at the time.  Today, he reports feeling more anxious today, he did not take his Xanax today yet. He had PT, which he felt helped. He would like to do occupational therapy if possible. He has occasional coughing when he eats something grainy or knots. He has not had any choking episodes or coughing with drinking water or other liquids. He has been on Mirapex 0.125 mg 3 times a day for the past few days. He just recently increased this to 3 times a day. He has noted a little bit of daytime somnolence. He becomes quite sleepy when he takes his Luvox. He  takes Xanax 0.25 mg strength 2-1/2 pills on an average day. He was started on gabapentin some 3 weeks ago by his psychiatrist and it is 100 mg 3 times a day. He has no side effects from the Mirapex and has not noticed much in the way of impact on his tremors or mobility or fine motor skills. He still has primarily left-sided tremors. His tremors in the lower body are worse than in the upper body.  He previously followed with Dr. [name]. He has an underlying medical history of depression, anxiety, carotid artery disease, lumbar spine degenerative disease, neck pain, allergic rhinitis, last seen by Dr. [name] on 11/21/2013 and before then he was seen in April 2015. I reviewed [name] notes. The patient originally presented with new onset tremors affecting his legs and his hands. Symptoms started a little over a year ago. 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 His wife had noted slowness in his walking and difficulty with fine motor tasks. He complained of muscle cramping in his legs at night. Tremor became worse with anxiety or stress. There was no report of REM behavior disorder. A maternal uncle may have had Parkinson's disease. He was on fluvoxamine in the past which was switched to Brintellix. He had been on Abilify or lithium. He was on Zyprexa which was stopped. Luvox was restarted. At the last visit with Dr. [name] in September 2015 the patient reported  some falls. He was tripping over his feet. His tremor had become worse. Dr. [name] felt that was a parkinsonian tremor at the time. He ordered a head CT as patient was unable to get an MRI because of in her ear implants. He tried the patient on Sinemet 25-100 milligrams strength one pill 3 times a day and referred the patient for physical therapy. In the interim, the patient called with worsening tremors and anxiety while on Sinemet and I suggested he taper off of it. The patient had a brain MRI without contrast on 12/07/2013 and I  reviewed the test results: This was done at [place]: No evidence of acute intracranial abnormality or mass. Mild cerebral atrophy and minimal chronic small vessel ischemic disease.   His Past Medical History Is Significant For: Past Medical History  Diagnosis Date  . Ulcer   . Depression   . Occlusion and stenosis of carotid artery without mention of cerebral infarction   . Lumbago   . Thrombocytopenia, unspecified   . Other malaise and fatigue 11/21/2007  . Allergic rhinitis due to pollen 11/21/2007  . Contusion of toe 09/12/2008  . Anxiety state, unspecified 11/28/2008  . Brachial neuritis or radiculitis NOS   . Cramp of limb   . Abnormal involuntary movements(781.0)   . Acute upper respiratory infections of unspecified site   . Cervicalgia   . Conjunctivitis unspecified   . Sprain of hand, unspecified site   . Enthesopathy of unspecified site   . Pain in limb   . Essential and other specified forms of tremor   . Hip, thigh, leg, and ankle, insect bite, nonvenomous, without mention of infection(916.4)   . Chest pain, unspecified   . Sprain of neck   . Dysfunction of eustachian tube   . Costal chondritis   . Sleep disturbance, unspecified   . Contact dermatitis and other eczema due to plants (except food)   . Acute sinusitis, unspecified     His Past Surgical History Is Significant For: Past Surgical History  Procedure Laterality Date  . Other surgical history  2002    ear surgery  . Colonoscopy  2008  . Stapedes surgery Right 2002    His Family History Is Significant For: Family History  Problem Relation Age of Onset  . Heart failure Mother     His Social History Is Significant For: History   Social History  . Marital Status: Married    Spouse Name: [name]     Number of Children: 2  . Years of Education: 12   Occupational History  .    [employer] Retired     Social History Main Topics  . Smoking status: Never Smoker   . Smokeless tobacco: Never Used   . Alcohol Use: No  . Drug Use: No  . Sexual Activity: None   Other Topics Concern  . None   Social History Narrative   Married to [name], has 2 children   Right handed   Master's plus    2 cups daily    His Allergies Are:  Allergies  Allergen Reactions  . Prednisone     Hyperactivity   . Wheat Bran   :   His Current Medications Are:  Outpatient Encounter Prescriptions as of 03/28/2014  Medication Sig  . ALPRAZolam (XANAX) 0.25 MG tablet Take 0.25 mg by mouth 3 (three) times daily.   . B Complex Vitamins (VITAMIN B COMPLEX PO) Take 1 tablet by mouth daily.  . Capsicum-Garlic 224-825 MG  CAPS Take 200-300 mg by mouth.  . Coenzyme Q10 (COQ-10) 100 MG CAPS Take by mouth daily.  . fludrocortisone (FLORINEF) 0.1 MG tablet   . fluvoxaMINE (LUVOX) 100 MG tablet 250 mg 2 (two) times daily. 50 am, 200 pm  . gabapentin (NEURONTIN) 100 MG capsule Take 100 mg by mouth 3 (three) times daily.  . Ginkgo Biloba (GNP GINGKO BILOBA EXTRACT PO) Take 120 mg by mouth.  . Lutein 10 MG TABS Take 1 tablet by mouth 3 (three) times daily.  . Multiple Vitamins-Minerals (MULTIVITAMIN WITH MINERALS) tablet Take 1 tablet by mouth daily.  . pramipexole (MIRAPEX) 0.125 MG tablet   . Saw Palmetto, Serenoa repens, 1000 MG CAPS Take 2 capsules by mouth daily.  . [DISCONTINUED] carbidopa-levodopa (SINEMET IR) 25-100 MG per tablet Take 1 tablet by mouth 3 (three) times daily. (Patient not taking: Reported on 03/28/2014)  . [DISCONTINUED] Magnesium 250 MG TABS Take 1 tablet by mouth daily.  :  Review of Systems:  Out of a complete 14 point review of systems, all are reviewed and negative with the exception of these symptoms as listed below:   Review of Systems  Neurological: Positive for tremors.  Psychiatric/Behavioral:       Anxiety is high today    Objective:  Neurologic Exam  Physical Exam Physical Examination:   Filed Vitals:   03/28/14 1106  BP: 107/62  Pulse: 55  Temp: 97 F (36.1 C)     General Examination: The patient is a very pleasant 74 y.o. male in no acute distress. He is situated in his chair. He is very anxious appearing again today.   HEENT: Normocephalic, atraumatic, pupils are equal, round and reactive to light and accommodation. Funduscopic exam is normal with sharp disc margins noted. Extraocular tracking shows mild saccadic breakdown without nystagmus noted. There is limitation to upper gaze. There is mild decrease in eye blink rate. Hearing is intact. Face is symmetric with mild facial masking and normal facial sensation. There is no lip, neck or jaw tremor. Neck is moderately rigid with intact passive ROM. There are no carotid bruits on auscultation. Oropharynx exam reveals mild mouth dryness. No significant airway crowding is noted. Mallampati is class II. Tongue protrudes centrally and palate elevates symmetrically.   There is no drooling.   Chest: is clear to auscultation without wheezing, rhonchi or crackles noted.  Heart: sounds are regular and normal without murmurs, rubs or gallops noted.   Abdomen: is soft, non-tender and non-distended with normal bowel sounds appreciated on auscultation.  Extremities: There is no pitting edema in the distal lower extremities bilaterally. Pedal pulses are intact.   Skin: is warm and dry with no trophic changes noted. Age-related changes are noted on the skin.   Musculoskeletal: exam reveals no obvious joint deformities, tenderness, joint swelling or erythema.  Neurologically:  Mental status: The patient is awake and alert, paying good  attention. He is able to provide the history and seems to calm down as our visit progresses. His wife provides some details. He is oriented to: person, place, time/date, situation, day of week, month of year and year. His memory, attention, language and knowledge are not impaired. There is no aphasia, agnosia, apraxia or anomia. There is a mild degree of bradyphrenia. Speech is  moderately hypophonic with no dysarthria noted. Mood is mildly depressed appearing and affect is constricted.   Cranial nerves are as described above under HEENT exam. In addition, shoulder shrug is normal with equal shoulder height noted.  Motor exam: Normal bulk, and strength for age is noted. There are no dyskinesias noted. Tone is mildly rigid with presence of cogwheeling in the bilateral upper extremities. There is overall mild bradykinesia. There is no drift or rebound.  There is a moderate degree of resting tremor in the left lower extremity, a mild degree of resting tremor in the right lower extremity and a slight intermittent trembling in both upper extremities, all unchanged.  Romberg is negative.  Reflexes are 1+ in the upper extremities and 1+ in the lower extremities. Toes are downgoing bilaterally.  Fine motor skills exam: Finger taps are mildly impaired on the right and moderately impaired on the left. Hand movements are mildly impaired on the right and mildly impaired on the left. RAP (rapid alternating patting) is mildly impaired on the right and mildly impaired on the left. Foot taps are mildly impaired on the right and moderately impaired on the left. Foot agility (in the form of heel stomping) is mildly impaired on the right and mildly impaired on the left.    Cerebellar testing shows no dysmetria or intention tremor on finger to nose testing. Heel to shin is unremarkable bilaterally. There is no truncal or gait ataxia.   Sensory exam is intact to light touch, PP, temperature and vibration sense.  Gait, station and balance: He stands up from the seated position with mild difficulty and needs to push up with His hands. He needs no assistance. No veering to one side is noted. He is not noted to lean to the side. Posture is mildly stooped, somewhat advanced for age. Stance is narrow-based. He walks with better stride length and pace than last time and slightly decreased arm swing on the  left. Tandem walk is possible with difficulty. Balance is mildly impaired.    Assessment and plan:   In summary, BRAD LIEURANCE is a very pleasant 74 year old male with a history of underlying medical history of depression, anxiety, carotid artery disease, lumbar spine degenerative disease, neck pain, allergic rhinitis, who presents for follow-up consultation of his tremor, gait disorder, slowness and other parkinsonian symptoms. His history and physical exam are concerning for parkinsonism, questionable left-sided predominant Parkinson's disease. So far he has not had much in the way of medication response to levodopa (albeit lower dose) and is currently on low-dose Mirapex. His symptoms date back to over a year ago. The situation is confounded and complicated by his long-standing history of anxiety and depression and flareup of anxiety and different treatments tried for his mood disorder, some of which can exacerbate tremors, some of which can actually induced parkinsonism (Zyprexa). I would have liked to pursue a DaT scan, but realistically this will be hard to do, because he would have to come off of his antidepressant. At this juncture, he is off of Sinemet, and on Mirapex for the past couple weeks or so. He was started on gabapentin some 3 weeks ago as well by his psychiatrist. He has an appointment next week for follow-up with his psychiatrist. At this juncture, I asked him to increase his Mirapex gradually to eventually 2 pills 3 times a day, we will increase this in one pill increments every week. He is advised about the possible side effects which include nausea, sleepiness in particular. He is still quite anxious. He would like to find something to do. He has worked for over 40 years and finds it hard to spend his time. He feels very restricted because of his mobility issues  and tremors and he thinks anxiety is also very cumbersome. He tries to workout every day but works out up to an hour each  time. Of asked him to not overdo it. I've asked him to stay well-hydrated and eat right and while he should exercise regularly he is advised to not exercise more than half an hour at a time to not get exhausted and not to push himself too hard.  Any medication changes will take time to be effective. We will try to increase his Mirapex and see how it goes. He's wondering if his antidepressant should be changed and I will let him discuss this with his psychiatrist. We may revisit levodopa therapy down the road.  I made a referral for outpatient occupational therapy. We will hold off on a swallow study for now. His daughter is a Physiological scientist and has also been working with him. I answered all their questions today and the patient and his wife were in agreement. I spent 40 min in total face-to-face time with the patient, more 50% of which was spent in counseling and coordination of care, reviewing test results, reviewing medication and reviewing the diagnosis of parkinsonism, its prognosis and treatment options.

## 2014-03-28 NOTE — Patient Instructions (Signed)
You have signs and symptoms of parkinsonism. We will gradually increase your Mirapex to 2 pills 3 times a day. For now, take 1 pill in AM, 1 at lunch and 2 at night for one week and then increase 1 pill every week.   I will see you back in about 3 months.

## 2014-04-16 ENCOUNTER — Telehealth: Payer: Self-pay | Admitting: *Deleted

## 2014-04-16 NOTE — Telephone Encounter (Signed)
Pt called asking about billing, he was doing his taxes and needed to know how to get statements from his visits. Gave him number for our billing [phone] and also states he should call [facility] to get their statement for the stress test he did Pt understood and was fine with calling them.

## 2014-05-07 ENCOUNTER — Encounter: Payer: Self-pay | Admitting: Occupational Therapy

## 2014-05-07 ENCOUNTER — Ambulatory Visit: Payer: Medicare PPO | Attending: Neurology | Admitting: Occupational Therapy

## 2014-05-07 DIAGNOSIS — R490 Dysphonia: Secondary | ICD-10-CM | POA: Insufficient documentation

## 2014-05-07 DIAGNOSIS — R29898 Other symptoms and signs involving the musculoskeletal system: Secondary | ICD-10-CM

## 2014-05-07 DIAGNOSIS — R278 Other lack of coordination: Secondary | ICD-10-CM

## 2014-05-07 DIAGNOSIS — R258 Other abnormal involuntary movements: Secondary | ICD-10-CM

## 2014-05-07 DIAGNOSIS — R1310 Dysphagia, unspecified: Secondary | ICD-10-CM | POA: Diagnosis present

## 2014-05-07 DIAGNOSIS — R279 Unspecified lack of coordination: Secondary | ICD-10-CM

## 2014-05-07 DIAGNOSIS — Z7409 Other reduced mobility: Secondary | ICD-10-CM

## 2014-05-07 NOTE — Therapy (Signed)
[facility] [address] [place], [place], [address] Phone: [phone]   Fax:  [phone]  Occupational Therapy Evaluation  Patient Details  Name: Joseph Arroyo MRN: [id number] Date of Birth: 1940-09-20 Referring Provider:  Star Age, MD  Encounter Date: 05/07/2014      OT End of Session - 05/07/14 1701    Visit Number 1   Number of Visits 17   Date for OT Re-Evaluation 07/06/14   Authorization Type [employer], G-code needed   Authorization Time Period awaiting authorization   OT Start Time 1545   OT Stop Time 1640   OT Time Calculation (min) 55 min   Activity Tolerance Patient tolerated treatment well   Behavior During Therapy [facility] for tasks assessed/performed      Past Medical History  Diagnosis Date  . Ulcer   . Depression   . Occlusion and stenosis of carotid artery without mention of cerebral infarction   . Lumbago   . Thrombocytopenia, unspecified   . Other malaise and fatigue 11/21/2007  . Allergic rhinitis due to pollen 11/21/2007  . Contusion of toe 09/12/2008  . Anxiety state, unspecified 11/28/2008  . Brachial neuritis or radiculitis NOS   . Cramp of limb   . Abnormal involuntary movements(781.0)   . Acute upper respiratory infections of unspecified site   . Cervicalgia   . Conjunctivitis unspecified   . Sprain of hand, unspecified site   . Enthesopathy of unspecified site   . Pain in limb   . Essential and other specified forms of tremor   . Hip, thigh, leg, and ankle, insect bite, nonvenomous, without mention of infection(916.4)   . Chest pain, unspecified   . Sprain of neck   . Dysfunction of eustachian tube   . Costal chondritis   . Sleep disturbance, unspecified   . Contact dermatitis and other eczema due to plants (except food)   . Acute sinusitis, unspecified     Past Surgical History  Procedure Laterality Date  . Other surgical history  2002    ear surgery  .  Colonoscopy  2008  . Stapedes surgery Right 2002    There were no vitals taken for this visit.  Visit Diagnosis:  Bradykinesia  Rigidity  Decreased coordination  Decreased functional mobility and endurance      Subjective Assessment - 05/07/14 1553    Symptoms Pt reports decreased interest in activities, tremors affecting daily activities, increased time for tasks, difficulty writing   Pertinent History hx of anxiety and depression, diagnosed with Parkinsonism 2015   Patient Stated Goals improve writing and daily tasks   Currently in Pain? Yes   Pain Score 3    Pain Location Back   Pain Orientation Lower   Pain Type Chronic pain   Aggravating Factors  driving, sitting for prolong periods   Pain Relieving Factors stretching, OTC meds          [facility] OT Assessment - 05/07/14 0001    Assessment   Diagnosis Parkinsonism   Onset Date --  diagnosed 2015   Prior Therapy PT late last year   Precautions   Precautions Fall   Restrictions   Weight Bearing Restrictions No   Balance Screen   Has the patient fallen in the past 6 months Yes   How many times? a few  tip over when riding bike, getting out of bed, ? others   Has the patient had a decrease in activity level because of a fear of  falling?  Yes   Is the patient reluctant to leave their home because of a fear of falling?  No   Home  Environment   Family/patient expects to be discharged to: Private residence   Lives With Felt with basic ADLs   Leisure working on [employer], goes to gym (dtr Physiological scientist), riding motorcycles/bikes   ADL   Eating/Feeding --  min spills, difficulty cutting, opening containers/packages   Grooming --  dificulty with tremors   Upper Body Bathing --  grab bars, increased time   Lower Body Bathing Increased time   Upper Body Dressing Increased time  min difficulty with buttons, difficulty with jacket   Lower Body Dressing  Increased time   Toilet Tranfer --  increased time   Tub/Shower Transfer Modified independent   Transfers/Ambulation Related to ADL's transfers improved after PT   IADL   Prior Level of Function 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, doing less now due to difficulty with hands   Light Housekeeping --  less car maintenance, did yard work last fall-incr time   Prior Level of Function Meal Prep wife performs    Programmer, applications own vehicle   Medication Management Is responsible for taking medication in correct dosages at correct time   Prior Level of Function Financial Management husband performed, but wife doing most now due to difficulty writing   Mobility   Mobility Status Independent;History of falls   Written Expression   Dominant Hand Right   Handwriting Increased time;Mild micrographia;100% legible  significant incr time, legibility typically worse   Written Experience --  pt reports missing letters/numbers   Vision - History   Visual History Cataracts   Activity Tolerance   Activity Tolerance Comments --  needs more rest   Cognition   Attention --  may be affected with depression   Memory --  pt denies, using strategies   Bradyphrenia Yes   Observation/Other Assessments   Physical Performance Test   Yes   Simulated Eating Time (seconds) 11.82   Donning Doffing Jacket Time (seconds) 14.68   Coordination   9 Hole Peg Test Right;Left   Right 9 Hole Peg Test 37.50   Left 9 Hole Peg Test 39.12   Box and Blocks R-50blocks, L-39blocks   Tremors Pt reports tremors in UEs and LEs (L>RUE).  Pt reports tremors fluctuate with anxiety increasing.   Tone   Assessment Location Right Upper Extremity;Left Upper Extremity   ROM / Strength   AROM / PROM / Strength AROM   AROM   Overall AROM  Within functional limits for tasks performed   RUE Tone   RUE Tone --  mild-mod rigidity   LUE Tone   LUE Tone Moderate  rigidity                         OT  Short Term Goals - 05/07/14 1709    OT SHORT TERM GOAL #1   Title Pt will be independent with PD-specific HEP for coordination, bradykinesia, rigidity.--due 06/06/14   Time 4   Period Weeks   Status New   OT SHORT TERM GOAL #2   Title Pt will verbalize understanding of ways to prevent future complications related to PD and appropriate community resources.--due 06/06/14   Time 4   Period Weeks   Status New   OT SHORT TERM GOAL #3   Title Pt will consistently be able  to write 2-3sentence paragraph with  100% legibility in only min increased time/decrease in size.--due 06/06/14   Baseline pt reports inconsistent size affecting legibility and demo signifcantly increased time needed to write (limits ability to perform financial managemnt)   Time 4   Period Weeks   Status New   OT SHORT TERM GOAL #4   Title Pt will demo increased ease with dressing as shown by completing PPT#4 in 11sec or less--due 06/06/14   Baseline 14.68   Time 4   Period Weeks   Status New           OT Long Term Goals - 2014/05/23 1723    OT LONG TERM GOAL #1   Title Pt will verbalize understanding of strategies/AE to increase ease with ADLs/IADLs prn.--due 07/06/14   Time 8   Period Weeks   Status New   OT LONG TERM GOAL #2   Title Pt will improve coordination for ADLs as shown by improving time on 9-hole peg test by at least 8sec with RUE.--due 07/06/14   Baseline 37.50   Time 8   Period Weeks   Status New   OT LONG TERM GOAL #3   Title Pt will improve coordination for ADLs as shown by improving time on 9-hole peg test by at least 8sec with LUE.--due 07/06/14   Baseline 39.12   Time 8   Period Weeks   Status New   OT LONG TERM GOAL #4   Title Pt will improve functional raching/coordination as shown by improving score by at least 6 on box and blocks test with LUE.----due 07/06/14   Baseline 39   Time 8   Period Weeks   Status New   OT LONG TERM GOAL #5   Title Pt will report increase ease with performing simple car  maintenance/yardwork tasks mod I.--due 07/06/14   Time 8   Period Weeks   Status New               Plan - 05-23-14 1702    Clinical Impression Statement Pt presents with diagnosis of Parkinsonism (2015) and presents with bradykinesia, rigidity, decreased coordination, tremors, decreased functional mobility and activity tolerance.  Pt has had PT last year with improvements reported by pt.  Pt with hx of anxiety and depression also impacting quality of life.   Pt will benefit from skilled therapeutic intervention in order to improve on the following deficits (Retired) Decreased coordination;Decreased activity tolerance;Impaired tone;Impaired UE functional use;Decreased knowledge of use of DME;Decreased balance;Decreased mobility  bradykinesia   Rehab Potential Good   OT Frequency 2x / week  +eval   OT Duration 8 weeks   OT Treatment/Interventions Self-care/ADL training;Moist Heat;Patient/family education;DME and/or AE instruction;Therapeutic exercises;Therapeutic activities;Neuromuscular education;Functional Mobility Training;Passive range of motion;Energy conservation;Manual Therapy;Cognitive remediation/compensation;Cryotherapy   Plan info about PDF anxiety webinar, PWR! hands/coordination HEP   Consulted and Agree with Plan of Care Patient;Family member/caregiver   Family Member Consulted daughter present          G-Codes - 2014/05/23 1731    Functional Assessment Tool Used 9-hole peg test:  R-37.50, L-39.12, Box and blocks test:  L-39blocks   Functional Limitation Carrying, moving and handling objects   Carrying, Moving and Handling Objects Current Status [phone]) At least 20 percent but less than 40 percent impaired, limited or restricted   Carrying, Moving and Handling Objects Goal Status ([id number]) At least 1 percent but less than 20 percent impaired, limited or restricted      Problem List Patient Active  Problem List   Diagnosis Date Noted  . Depression 07/26/2013  . Unsteady  gait 07/26/2013  . Orthostatic hypotension 07/26/2013  . Anxiety 06/23/2013  . Chest pain 06/23/2013  . Tremor 05/18/2013  . Occlusion and stenosis of carotid artery without mention of cerebral infarction 08/30/2012    [facility] 05/07/2014, 5:34 PM  [facility] [address] [place], [place], [address] Phone: [phone]   Fax:  [place], OTR/L 05/07/2014 5:34 PM

## 2014-05-08 ENCOUNTER — Telehealth: Payer: Self-pay | Admitting: Neurology

## 2014-05-08 NOTE — Telephone Encounter (Signed)
I talked to Joseph Arroyo, he wanted you to be aware of 2 things below and wants to make sure that you approve of treatment below.  * [facility] is slowly taking him off of Luvox and started Paxil (now on 30mg ). He states his anxiety is "high" right now.  * He is currently taking Mirapex 0.125mg  2tab at 8am, 1 tab at 3pm, and 2 tab at 10pm. Reports that he feels like he is more shaky then usual. Any suggestions?

## 2014-05-08 NOTE — Telephone Encounter (Signed)
Pt is calling stating he needs to go over medication changes with you, if he doesn't answer it is ok to leave a message stating a good time for him to call you back.  Please call and advise.

## 2014-05-09 NOTE — Telephone Encounter (Signed)
Anxiety will flareup anytime he will make a medication change. I would like to let him continue following the instructions from his mental health professional for anxiety management. He can try to increase his Mirapex to 2 tablets 3 times a day which would be one more tablet than what he is taking now. He is more shaky because his anxiety is worse. This will improve when his anxiety is improved. He will just have to wait it out.

## 2014-05-09 NOTE — Telephone Encounter (Signed)
Talked to patient and his wife. They are aware of her recommendations. And rescheduled appt, so patient's daught can go with.

## 2014-05-22 ENCOUNTER — Encounter: Payer: Medicare PPO | Admitting: Occupational Therapy

## 2014-05-23 ENCOUNTER — Ambulatory Visit: Payer: Medicare PPO | Admitting: Occupational Therapy

## 2014-05-29 ENCOUNTER — Encounter: Payer: Self-pay | Admitting: Occupational Therapy

## 2014-05-29 ENCOUNTER — Ambulatory Visit: Payer: Medicare PPO | Admitting: Occupational Therapy

## 2014-05-29 DIAGNOSIS — R279 Unspecified lack of coordination: Secondary | ICD-10-CM

## 2014-05-29 DIAGNOSIS — R278 Other lack of coordination: Secondary | ICD-10-CM

## 2014-05-29 DIAGNOSIS — R258 Other abnormal involuntary movements: Secondary | ICD-10-CM

## 2014-05-29 DIAGNOSIS — R29898 Other symptoms and signs involving the musculoskeletal system: Secondary | ICD-10-CM

## 2014-05-29 DIAGNOSIS — R490 Dysphonia: Secondary | ICD-10-CM | POA: Diagnosis not present

## 2014-05-29 NOTE — Therapy (Signed)
[facility] [address] [place], [place], [address] Phone: [phone]   Fax:  [phone]  Occupational Therapy Treatment  Patient Details  Name: Joseph Arroyo MRN: [id number] Date of Birth: [birth year] Referring Provider:  Ashok Norris, MD  Encounter Date: 05/29/2014      OT End of Session - 05/29/14 1209    Visit Number 2   Number of Visits 17   Date for OT Re-Evaluation 07/06/14   Authorization Type [employer], G-code needed   Authorization Time Period 05/07/14-06/21/14 approved 6 visits   Authorization - Visit Number 1   Authorization - Number of Visits 6   OT Start Time 1103   OT Stop Time 1151   OT Time Calculation (min) 48 min   Activity Tolerance Patient tolerated treatment well   Behavior During Therapy [facility] for tasks assessed/performed      Past Medical History  Diagnosis Date  . Ulcer   . Depression   . Occlusion and stenosis of carotid artery without mention of cerebral infarction   . Lumbago   . Thrombocytopenia, unspecified   . Other malaise and fatigue 11/21/2007  . Allergic rhinitis due to pollen 11/21/2007  . Contusion of toe 09/12/2008  . Anxiety state, unspecified 11/28/2008  . Brachial neuritis or radiculitis NOS   . Cramp of limb   . Abnormal involuntary movements(781.0)   . Acute upper respiratory infections of unspecified site   . Cervicalgia   . Conjunctivitis unspecified   . Sprain of hand, unspecified site   . Enthesopathy of unspecified site   . Pain in limb   . Essential and other specified forms of tremor   . Hip, thigh, leg, and ankle, insect bite, nonvenomous, without mention of infection(916.4)   . Chest pain, unspecified   . Sprain of neck   . Dysfunction of eustachian tube   . Costal chondritis   . Sleep disturbance, unspecified   . Contact dermatitis and other eczema due to plants (except food)   . Acute sinusitis, unspecified     Past Surgical History   Procedure Laterality Date  . Other surgical history  2002    ear surgery  . Colonoscopy  2008  . Stapedes surgery Right 2002    There were no vitals filed for this visit.  Visit Diagnosis:  Bradykinesia  Rigidity  Decreased coordination      Subjective Assessment - 05/29/14 1207    Symptoms Pt/family report that they would like to wait before scheduling ST re-eval as they think it will be too much for pt.   Currently in Pain? No/denies                            OT Education - 05/29/14 1207    Education provided Yes   Education Details PWR! Hands HEP, initiated coordination HEP, info for PDF Anxiety webinar, how PD affects UE/hand movement, use of PWR! hands to help with ADLs as strategy   Person(s) Educated Patient;Spouse;Child(ren)   Methods Explanation;Demonstration;Verbal cues;Tactile cues;Handout   Comprehension Verbalized understanding;Returned demonstration;Verbal cues required          OT Short Term Goals - 05/07/14 1709    OT SHORT TERM GOAL #1   Title Pt will be independent with PD-specific HEP for coordination, bradykinesia, rigidity.--due 06/06/14   Time 4   Period Weeks   Status New   OT SHORT TERM GOAL #2   Title Pt will verbalize understanding  of ways to prevent future complications related to PD and appropriate community resources.--due 06/06/14   Time 4   Period Weeks   Status New   OT SHORT TERM GOAL #3   Title Pt will consistently be able to write 2-3sentence paragraph with  100% legibility in only min increased time/decrease in size.--due 06/06/14   Baseline pt reports inconsistent size affecting legibility and demo signifcantly increased time needed to write (limits ability to perform financial managemnt)   Time 4   Period Weeks   Status New   OT SHORT TERM GOAL #4   Title Pt will demo increased ease with dressing as shown by completing PPT#4 in 11sec or less--due 06/06/14   Baseline 14.68   Time 4   Period Weeks   Status New            OT Long Term Goals - 05/07/14 1723    OT LONG TERM GOAL #1   Title Pt will verbalize understanding of strategies/AE to increase ease with ADLs/IADLs prn.--due 07/06/14   Time 8   Period Weeks   Status New   OT LONG TERM GOAL #2   Title Pt will improve coordination for ADLs as shown by improving time on 9-hole peg test by at least 8sec with RUE.--due 07/06/14   Baseline 37.50   Time 8   Period Weeks   Status New   OT LONG TERM GOAL #3   Title Pt will improve coordination for ADLs as shown by improving time on 9-hole peg test by at least 8sec with LUE.--due 07/06/14   Baseline 39.12   Time 8   Period Weeks   Status New   OT LONG TERM GOAL #4   Title Pt will improve functional raching/coordination as shown by improving score by at least 6 on box and blocks test with LUE.----due 07/06/14   Baseline 39   Time 8   Period Weeks   Status New   OT LONG TERM GOAL #5   Title Pt will report increase ease with performing simple car maintenance/yardwork tasks mod I.--due 07/06/14   Time 8   Period Weeks   Status New               Plan - 05/29/14 1210    Clinical Impression Statement Pt needed mod cueing for big movements in functional tasks today, but improved movement amplitude with repetition.   Plan add to coordination HEP   Consulted and Agree with Plan of Care Patient;Family member/caregiver   Family Member Consulted daughter, wife        Problem List Patient Active Problem List   Diagnosis Date Noted  . Depression 07/26/2013  . Unsteady gait 07/26/2013  . Orthostatic hypotension 07/26/2013  . Anxiety 06/23/2013  . Chest pain 06/23/2013  . Tremor 05/18/2013  . Occlusion and stenosis of carotid artery without mention of cerebral infarction 08/30/2012    [facility] 05/29/2014, 12:15 PM  [facility] [address] [place], [place], [address] Phone: [phone]   Fax:  [place], OTR/L 05/29/2014 12:15 PM

## 2014-05-29 NOTE — Patient Instructions (Signed)
PWR! Hands  With arms stretched out in front of you (elbows straight), perform the following:  PWR! Rock: Move wrists up and down [employer]! Twist: Twist palms up and down BIG  Then, start with elbows bent and hands closed.  PWR! Step: Touch index finger to thumb while keeping other fingers straight (Start with elbows bent). Flick fingers out BIG (thumb out/straighten fingers) while straighten elbows. Repeat with other fingers. (Step your thumb to each finger).  PWR! Hands: Push hands out BIG. Elbows straight, wrists up, fingers open and spread apart BIG. (Can also perform by pushing down on table, chair, knees. Push above head, out to the side, behind you, in front of you.)   ** Make each movement big and deliberate so that you feel the movement.  Perform at least 10 repetitions 1x/day, but perform PWR! hands throughout the day when you are having trouble using your hands (picking up/manipulating small objects, writing, eating, typing, sewing, buttoning, etc.).  Coordination Exercises  Perform the following exercises for 15-20 minutes 1 times per day. Perform with both hand(s). Perform using big movements.   Flipping Cards: Place deck of cards on the table. Flip cards over by opening your hand big to grasp and then turn your palm up big.  Deal cards: Hold 1/2 or whole deck in your hand. Use thumb to push card off top of deck with one big push.

## 2014-05-31 ENCOUNTER — Ambulatory Visit: Payer: Medicare PPO | Admitting: Occupational Therapy

## 2014-05-31 ENCOUNTER — Encounter: Payer: Medicare PPO | Admitting: Occupational Therapy

## 2014-05-31 ENCOUNTER — Encounter: Payer: Self-pay | Admitting: Occupational Therapy

## 2014-05-31 DIAGNOSIS — R258 Other abnormal involuntary movements: Secondary | ICD-10-CM

## 2014-05-31 DIAGNOSIS — R29898 Other symptoms and signs involving the musculoskeletal system: Secondary | ICD-10-CM

## 2014-05-31 DIAGNOSIS — R278 Other lack of coordination: Secondary | ICD-10-CM

## 2014-05-31 DIAGNOSIS — R279 Unspecified lack of coordination: Secondary | ICD-10-CM

## 2014-05-31 DIAGNOSIS — R490 Dysphonia: Secondary | ICD-10-CM | POA: Diagnosis not present

## 2014-05-31 NOTE — Therapy (Signed)
[facility] [address] [place], [place], [address] Phone: [phone]   Fax:  [phone]  Occupational Therapy Treatment  Patient Details  Name: Joseph Arroyo MRN: [id number] Date of Birth: [birth year] Referring Provider:  Ashok Norris, MD  Encounter Date: 05/31/2014      OT End of Session - 05/31/14 1227    Visit Number 3   Number of Visits 17   Date for OT Re-Evaluation 07/06/14   Authorization Type [employer], G-code needed   Authorization Time Period 05/07/14-06/21/14 approved 6 visits   Authorization - Visit Number 2   Authorization - Number of Visits 6   OT Start Time 1103   OT Stop Time 1148   OT Time Calculation (min) 45 min   Activity Tolerance Patient tolerated treatment well   Behavior During Therapy [facility] for tasks assessed/performed      Past Medical History  Diagnosis Date  . Ulcer   . Depression   . Occlusion and stenosis of carotid artery without mention of cerebral infarction   . Lumbago   . Thrombocytopenia, unspecified   . Other malaise and fatigue 11/21/2007  . Allergic rhinitis due to pollen 11/21/2007  . Contusion of toe 09/12/2008  . Anxiety state, unspecified 11/28/2008  . Brachial neuritis or radiculitis NOS   . Cramp of limb   . Abnormal involuntary movements(781.0)   . Acute upper respiratory infections of unspecified site   . Cervicalgia   . Conjunctivitis unspecified   . Sprain of hand, unspecified site   . Enthesopathy of unspecified site   . Pain in limb   . Essential and other specified forms of tremor   . Hip, thigh, leg, and ankle, insect bite, nonvenomous, without mention of infection(916.4)   . Chest pain, unspecified   . Sprain of neck   . Dysfunction of eustachian tube   . Costal chondritis   . Sleep disturbance, unspecified   . Contact dermatitis and other eczema due to plants (except food)   . Acute sinusitis, unspecified     Past Surgical History   Procedure Laterality Date  . Other surgical history  2002    ear surgery  . Colonoscopy  2008  . Stapedes surgery Right 2002    There were no vitals filed for this visit.  Visit Diagnosis:  Bradykinesia  Rigidity  Decreased coordination      Subjective Assessment - 05/31/14 1107    Symptoms tried exercises yesterday   Currently in Pain? No/denies                            OT Education - 05/31/14 1226    Education Details Added/updated coordination HEP   Person(s) Educated Patient;Spouse   Methods Explanation;Demonstration;Verbal cues;Handout   Comprehension Verbalized understanding;Returned demonstration;Verbal cues required          OT Short Term Goals - 05/07/14 1709    OT SHORT TERM GOAL #1   Title Pt will be independent with PD-specific HEP for coordination, bradykinesia, rigidity.--due 06/06/14   Time 4   Period Weeks   Status New   OT SHORT TERM GOAL #2   Title Pt will verbalize understanding of ways to prevent future complications related to PD and appropriate community resources.--due 06/06/14   Time 4   Period Weeks   Status New   OT SHORT TERM GOAL #3   Title Pt will consistently be able to write 2-3sentence paragraph with  100%  legibility in only min increased time/decrease in size.--due 06/06/14   Baseline pt reports inconsistent size affecting legibility and demo signifcantly increased time needed to write (limits ability to perform financial managemnt)   Time 4   Period Weeks   Status New   OT SHORT TERM GOAL #4   Title Pt will demo increased ease with dressing as shown by completing PPT#4 in 11sec or less--due 06/06/14   Baseline 14.68   Time 4   Period Weeks   Status New           OT Long Term Goals - 05/07/14 1723    OT LONG TERM GOAL #1   Title Pt will verbalize understanding of strategies/AE to increase ease with ADLs/IADLs prn.--due 07/06/14   Time 8   Period Weeks   Status New   OT LONG TERM GOAL #2   Title Pt will  improve coordination for ADLs as shown by improving time on 9-hole peg test by at least 8sec with RUE.--due 07/06/14   Baseline 37.50   Time 8   Period Weeks   Status New   OT LONG TERM GOAL #3   Title Pt will improve coordination for ADLs as shown by improving time on 9-hole peg test by at least 8sec with LUE.--due 07/06/14   Baseline 39.12   Time 8   Period Weeks   Status New   OT LONG TERM GOAL #4   Title Pt will improve functional raching/coordination as shown by improving score by at least 6 on box and blocks test with LUE.----due 07/06/14   Baseline 39   Time 8   Period Weeks   Status New   OT LONG TERM GOAL #5   Title Pt will report increase ease with performing simple car maintenance/yardwork tasks mod I.--due 07/06/14   Time 8   Period Weeks   Status New               Plan - 05/31/14 1228    Clinical Impression Statement Pt performed well with min cueing for big movements for coordination tasks today.  Tremors decreased with functional coordination activities.   Plan review HEP, then instruct in writing strategies/big movements with ADLs (issue handout and review) next week   OT [place] has been issued:  PWR! hands, coordination HEP (3/31)   Consulted and Agree with Plan of Care Patient;Family member/caregiver   Family Member Consulted wife        Problem List Patient Active Problem List   Diagnosis Date Noted  . Depression 07/26/2013  . Unsteady gait 07/26/2013  . Orthostatic hypotension 07/26/2013  . Anxiety 06/23/2013  . Chest pain 06/23/2013  . Tremor 05/18/2013  . Occlusion and stenosis of carotid artery without mention of cerebral infarction 08/30/2012    [facility] 05/31/2014, 12:30 PM  [facility] [address] [place], [place], [address] Phone: [phone]   Fax:  [place], OTR/L 05/31/2014 12:30 PM

## 2014-05-31 NOTE — Patient Instructions (Signed)
Coordination Exercises  Perform the following exercises for 20 minutes 1 times per day. Perform with both hand(s). Perform using big movements.   Flipping Cards: Place deck of cards on the table. Flip cards over by opening your hand big to grasp and then turn your palm up big.  Deal cards: Hold 1/2 or whole deck in your hand. Use thumb to push card off top of deck with one big push.  Ball push-ups:  Hold ball in fingertips, bring ball down to the palm as much as you can, then push all the way out as much as you can.  Rotate ball with fingertips: Pick up with fingers/thumb and move as much as you can with each turn/movement (clockwise and counter-clockwise).  Toss ball in the air and catch with the same hand: Toss big/high.  Rotate 2 golf balls in your hand: Both directions.  Pick up coins and stack one at a time: Pick up with big, intentional movements. Do not drag coin to the edge. (5-10 in a stack)  Pick up 5-10 coins one at a time and hold in palm. Then, move coins from palm to fingertips one at time and place in coin bank/container.  Fasten nuts/bolts or put on bottle caps: Turn as much/as big as you can with each turn.  Move your wrist.

## 2014-06-06 ENCOUNTER — Ambulatory Visit: Payer: Medicare PPO | Attending: Neurology | Admitting: Occupational Therapy

## 2014-06-06 DIAGNOSIS — R279 Unspecified lack of coordination: Secondary | ICD-10-CM

## 2014-06-06 DIAGNOSIS — R490 Dysphonia: Secondary | ICD-10-CM | POA: Diagnosis not present

## 2014-06-06 DIAGNOSIS — R29898 Other symptoms and signs involving the musculoskeletal system: Secondary | ICD-10-CM

## 2014-06-06 DIAGNOSIS — R1310 Dysphagia, unspecified: Secondary | ICD-10-CM | POA: Diagnosis present

## 2014-06-06 DIAGNOSIS — R258 Other abnormal involuntary movements: Secondary | ICD-10-CM

## 2014-06-06 DIAGNOSIS — R278 Other lack of coordination: Secondary | ICD-10-CM

## 2014-06-06 NOTE — Therapy (Signed)
[facility] [address] [place], [place], [address] Phone: [phone]   Fax:  [phone]  Occupational Therapy Treatment  Patient Details  Name: Joseph Arroyo MRN: [id number] Date of Birth: April 09, 1940 Referring Provider:  Ashok Norris, MD  Encounter Date: 06/06/2014      OT End of Session - 06/06/14 1410    Visit Number 4   Number of Visits 17   Date for OT Re-Evaluation 07/06/14   Authorization Type [employer], G-code needed   Authorization Time Period 05/07/14-06/21/14 approved 6 visits   OT Start Time 1405   OT Stop Time 1445   OT Time Calculation (min) 40 min   Activity Tolerance Patient tolerated treatment well   Behavior During Therapy [facility] for tasks assessed/performed      Past Medical History  Diagnosis Date  . Ulcer   . Depression   . Occlusion and stenosis of carotid artery without mention of cerebral infarction   . Lumbago   . Thrombocytopenia, unspecified   . Other malaise and fatigue 11/21/2007  . Allergic rhinitis due to pollen 11/21/2007  . Contusion of toe 09/12/2008  . Anxiety state, unspecified 11/28/2008  . Brachial neuritis or radiculitis NOS   . Cramp of limb   . Abnormal involuntary movements(781.0)   . Acute upper respiratory infections of unspecified site   . Cervicalgia   . Conjunctivitis unspecified   . Sprain of hand, unspecified site   . Enthesopathy of unspecified site   . Pain in limb   . Essential and other specified forms of tremor   . Hip, thigh, leg, and ankle, insect bite, nonvenomous, without mention of infection(916.4)   . Chest pain, unspecified   . Sprain of neck   . Dysfunction of eustachian tube   . Costal chondritis   . Sleep disturbance, unspecified   . Contact dermatitis and other eczema due to plants (except food)   . Acute sinusitis, unspecified     Past Surgical History  Procedure Laterality Date  . Other surgical history  2002    ear surgery   . Colonoscopy  2008  . Stapedes surgery Right 2002    There were no vitals filed for this visit.  Visit Diagnosis:  Decreased coordination  Rigidity  Bradykinesia      Subjective Assessment - 06/06/14 1409    Currently in Pain? Yes   Pain Score 4    Pain Location Back   Pain Orientation Lower   Pain Type Chronic pain   Pain Onset 1 to 4 weeks ago   Pain Frequency Intermittent   Aggravating Factors  sitting   Pain Relieving Factors stretching      Pt. Reports going to see a holistic MD yesterday in [place]. Pt. will see in another 3 months.               OT Treatments/Exercises (OP) - 06/06/14 0001    Fine Motor Coordination   Fine Motor Coordination Flipping cards;Dealing card with thumb;Picking up coins;Manipulating coins;Stacking coins;Handwriting  Pt demonstrates greater difficulty with LUE for all tasks.   Flipping cards min v.c. bilateral UE's   Dealing card with thumb min v.c. bilateral UE's   Picking up coins min v.c bilateral UE's   Manipulating coins min v.c. bilateral UE's   Stacking coins min v.c bilateral UE's   Handwriting Pt practiced handwriting with foam grip. PT and daughter wer instructed in handwriting strategies. PWR! hands prior to task. Pt demonstrates good legility but  requires increased time.  Pt. can benefit from reinforcement.                OT Education - 06/06/14 1713    Education Details Handwriting strategies   Person(s) Educated Patient;Child(ren)   Methods Explanation;Demonstration;Handout   Comprehension Verbalized understanding;Returned demonstration          OT Short Term Goals - 05/07/14 1709    OT SHORT TERM GOAL #1   Title Pt will be independent with PD-specific HEP for coordination, bradykinesia, rigidity.--due 06/06/14   Time 4   Period Weeks   Status New   OT SHORT TERM GOAL #2   Title Pt will verbalize understanding of ways to prevent future complications related to PD and appropriate  community resources.--due 06/06/14   Time 4   Period Weeks   Status New   OT SHORT TERM GOAL #3   Title Pt will consistently be able to write 2-3sentence paragraph with  100% legibility in only min increased time/decrease in size.--due 06/06/14   Baseline pt reports inconsistent size affecting legibility and demo signifcantly increased time needed to write (limits ability to perform financial managemnt)   Time 4   Period Weeks   Status New   OT SHORT TERM GOAL #4   Title Pt will demo increased ease with dressing as shown by completing PPT#4 in 11sec or less--due 06/06/14   Baseline 14.68   Time 4   Period Weeks   Status New           OT Long Term Goals - 05/07/14 1723    OT LONG TERM GOAL #1   Title Pt will verbalize understanding of strategies/AE to increase ease with ADLs/IADLs prn.--due 07/06/14   Time 8   Period Weeks   Status New   OT LONG TERM GOAL #2   Title Pt will improve coordination for ADLs as shown by improving time on 9-hole peg test by at least 8sec with RUE.--due 07/06/14   Baseline 37.50   Time 8   Period Weeks   Status New   OT LONG TERM GOAL #3   Title Pt will improve coordination for ADLs as shown by improving time on 9-hole peg test by at least 8sec with LUE.--due 07/06/14   Baseline 39.12   Time 8   Period Weeks   Status New   OT LONG TERM GOAL #4   Title Pt will improve functional raching/coordination as shown by improving score by at least 6 on box and blocks test with LUE.----due 07/06/14   Baseline 39   Time 8   Period Weeks   Status New   OT LONG TERM GOAL #5   Title Pt will report increase ease with performing simple car maintenance/yardwork tasks mod I.--due 07/06/14   Time 8   Period Weeks   Status New               Plan - 06/06/14 1428    Clinical Impression Statement Pt is progressing well towards goals for coordination/handwriting.   Plan finish reviewing coordiantion HEP, big movements with ADLS/ issue handout.   OT Home Exercise Plan  has been issued:  PWR! hands, coordination HEP (3/31)   Consulted and Agree with Plan of Care Patient;Family member/caregiver   Family Member Consulted daughter        Problem List Patient Active Problem List   Diagnosis Date Noted  . Depression 07/26/2013  . Unsteady gait 07/26/2013  . Orthostatic hypotension 07/26/2013  . Anxiety 06/23/2013  .  Chest pain 06/23/2013  . Tremor 05/18/2013  . Occlusion and stenosis of carotid artery without mention of cerebral infarction 08/30/2012    Candelario Steppe 06/06/2014, 5:21 PM  [facility] [address] [place], [place], [address] Phone: [phone]   Fax:  [phone]

## 2014-06-08 ENCOUNTER — Ambulatory Visit: Payer: Medicare PPO | Admitting: Occupational Therapy

## 2014-06-08 DIAGNOSIS — R29898 Other symptoms and signs involving the musculoskeletal system: Secondary | ICD-10-CM

## 2014-06-08 DIAGNOSIS — Z7409 Other reduced mobility: Secondary | ICD-10-CM

## 2014-06-08 DIAGNOSIS — R278 Other lack of coordination: Secondary | ICD-10-CM

## 2014-06-08 DIAGNOSIS — R258 Other abnormal involuntary movements: Secondary | ICD-10-CM

## 2014-06-08 DIAGNOSIS — R279 Unspecified lack of coordination: Secondary | ICD-10-CM

## 2014-06-08 DIAGNOSIS — R490 Dysphonia: Secondary | ICD-10-CM | POA: Diagnosis not present

## 2014-06-08 NOTE — Patient Instructions (Signed)

## 2014-06-08 NOTE — Therapy (Signed)
[facility] [address] [place], [place], [address] Phone: [phone]   Fax:  [phone]  Occupational Therapy Treatment  Patient Details  Name: Joseph Arroyo MRN: [id number] Date of Birth: Oct 09, 1940 Referring Provider:  Ashok Norris, MD  Encounter Date: 06/08/2014      OT End of Session - 06/08/14 1401    Visit Number 5   Number of Visits 17   Date for OT Re-Evaluation 07/06/14   Authorization Type [employer], G-code needed   Authorization - Visit Number 4   Authorization - Number of Visits 6   OT Start Time 1320   OT Stop Time 1405   OT Time Calculation (min) 45 min   Activity Tolerance Patient tolerated treatment well   Behavior During Therapy [facility] for tasks assessed/performed      Past Medical History  Diagnosis Date  . Ulcer   . Depression   . Occlusion and stenosis of carotid artery without mention of cerebral infarction   . Lumbago   . Thrombocytopenia, unspecified   . Other malaise and fatigue 11/21/2007  . Allergic rhinitis due to pollen 11/21/2007  . Contusion of toe 09/12/2008  . Anxiety state, unspecified 11/28/2008  . Brachial neuritis or radiculitis NOS   . Cramp of limb   . Abnormal involuntary movements(781.0)   . Acute upper respiratory infections of unspecified site   . Cervicalgia   . Conjunctivitis unspecified   . Sprain of hand, unspecified site   . Enthesopathy of unspecified site   . Pain in limb   . Essential and other specified forms of tremor   . Hip, thigh, leg, and ankle, insect bite, nonvenomous, without mention of infection(916.4)   . Chest pain, unspecified   . Sprain of neck   . Dysfunction of eustachian tube   . Costal chondritis   . Sleep disturbance, unspecified   . Contact dermatitis and other eczema due to plants (except food)   . Acute sinusitis, unspecified     Past Surgical History  Procedure Laterality Date  . Other surgical history  2002   ear surgery  . Colonoscopy  2008  . Stapedes surgery Right 2002    There were no vitals filed for this visit.  Visit Diagnosis:  Rigidity  Decreased functional mobility and endurance  Bradykinesia  Decreased coordination      Subjective Assessment - 06/08/14 1725    Subjective  Pt reports sometimes exercises can be overwhelming   Pertinent History hx of anxiety and depression, diagnosed with Parkinsonism 2015   Patient Stated Goals improve writing and daily tasks   Currently in Pain? Yes   Pain Score 3    Pain Location Back   Pain Orientation Lower   Pain Descriptors / Indicators Aching   Pain Type Chronic pain   Pain Onset More than a month ago   Pain Frequency Intermittent   Aggravating Factors  standing   Pain Relieving Factors stretching        Treatment: rotating ball in bilateral hands with emphasis on big movements. Education regarding ways to incorporate big movements into ADLS.  therapist recommends pt does not climb on roof, climb ladder or use chainsaw due to risk for injury, pt's daughter was present and in agreement.               PWR [facility]) - 06/08/14 1730    PWR! Up 20   PWR! Rock 20   PWR! Twist 20   PWR! Step 20  Comments modified quardaped at table, min v.c./ demo.   Pt's dtr present and able to cue him.             OT Education - 06/08/14 1731    Education provided Yes   Education Details modified quadraped PWR!, ways to incorporate big movments into ADLS.   Person(s) Educated Child(ren);Patient   Methods Explanation;Demonstration;Verbal cues;Handout   Comprehension Verbalized understanding;Returned demonstration          OT Short Term Goals - 05/07/14 1709    OT SHORT TERM GOAL #1   Title Pt will be independent with PD-specific HEP for coordination, bradykinesia, rigidity.--due 06/06/14   Time 4   Period Weeks   Status New   OT SHORT TERM GOAL #2   Title Pt will verbalize understanding of ways to prevent future  complications related to PD and appropriate community resources.--due 06/06/14   Time 4   Period Weeks   Status New   OT SHORT TERM GOAL #3   Title Pt will consistently be able to write 2-3sentence paragraph with  100% legibility in only min increased time/decrease in size.--due 06/06/14   Baseline pt reports inconsistent size affecting legibility and demo signifcantly increased time needed to write (limits ability to perform financial managemnt)   Time 4   Period Weeks   Status New   OT SHORT TERM GOAL #4   Title Pt will demo increased ease with dressing as shown by completing PPT#4 in 11sec or less--due 06/06/14   Baseline 14.68   Time 4   Period Weeks   Status New           OT Long Term Goals - 05/07/14 1723    OT LONG TERM GOAL #1   Title Pt will verbalize understanding of strategies/AE to increase ease with ADLs/IADLs prn.--due 07/06/14   Time 8   Period Weeks   Status New   OT LONG TERM GOAL #2   Title Pt will improve coordination for ADLs as shown by improving time on 9-hole peg test by at least 8sec with RUE.--due 07/06/14   Baseline 37.50   Time 8   Period Weeks   Status New   OT LONG TERM GOAL #3   Title Pt will improve coordination for ADLs as shown by improving time on 9-hole peg test by at least 8sec with LUE.--due 07/06/14   Baseline 39.12   Time 8   Period Weeks   Status New   OT LONG TERM GOAL #4   Title Pt will improve functional raching/coordination as shown by improving score by at least 6 on box and blocks test with LUE.----due 07/06/14   Baseline 39   Time 8   Period Weeks   Status New   OT LONG TERM GOAL #5   Title Pt will report increase ease with performing simple car maintenance/yardwork tasks mod I.--due 07/06/14   Time 8   Period Weeks   Status New               Plan - 06/08/14 1416    Clinical Impression Statement Pt is progressing towards goals for coordination.   Plan Reinforce PWR! modified quadraped, practice simulatedADLS, request more  visits from [place] has been issued:  PWR! hands, coordination HEP (3/31) big movments with ADLS, modified quadraped PWR! (06/08/14)   Consulted and Agree with Plan of Care Patient;Family member/caregiver   Family Member Consulted daughter        Problem List  Patient Active Problem List   Diagnosis Date Noted  . Depression 07/26/2013  . Unsteady gait 07/26/2013  . Orthostatic hypotension 07/26/2013  . Anxiety 06/23/2013  . Chest pain 06/23/2013  . Tremor 05/18/2013  . Occlusion and stenosis of carotid artery without mention of cerebral infarction 08/30/2012    RINE,KATHRYN 06/08/2014, 5:33 PM Theone Murdoch, OTR/L [phone] Phone: [phone] 5:33 PM 06/08/2014 [facility] [address] [place], [place], [address] Phone: [phone]   Fax:  [phone]

## 2014-06-11 ENCOUNTER — Encounter: Payer: Medicare PPO | Admitting: Occupational Therapy

## 2014-06-12 ENCOUNTER — Ambulatory Visit: Payer: Medicare PPO | Admitting: Occupational Therapy

## 2014-06-12 ENCOUNTER — Encounter: Payer: Self-pay | Admitting: Occupational Therapy

## 2014-06-12 DIAGNOSIS — R258 Other abnormal involuntary movements: Secondary | ICD-10-CM

## 2014-06-12 DIAGNOSIS — R490 Dysphonia: Secondary | ICD-10-CM | POA: Diagnosis not present

## 2014-06-12 DIAGNOSIS — R279 Unspecified lack of coordination: Secondary | ICD-10-CM

## 2014-06-12 DIAGNOSIS — R29898 Other symptoms and signs involving the musculoskeletal system: Secondary | ICD-10-CM

## 2014-06-12 DIAGNOSIS — R278 Other lack of coordination: Secondary | ICD-10-CM

## 2014-06-12 NOTE — Therapy (Addendum)
[facility] [address] [place], [place], [address] Phone: [phone]   Fax:  [phone]  Occupational Therapy Treatment  Patient Details  Name: Joseph Arroyo MRN: [id number] Date of Birth: Dec 30, 1940 Referring Provider:  Ashok Norris, MD  Encounter Date: 06/12/2014      OT End of Session - 06/12/14 1319    Visit Number 6   Number of Visits 17   Date for OT Re-Evaluation 07/06/14   Authorization Type [employer], G-code needed   Authorization - Visit Number 5   Authorization - Number of Visits 6   OT Start Time 0352   OT Stop Time 1240   OT Time Calculation (min) 51 min   Activity Tolerance Patient tolerated treatment well   Behavior During Therapy [facility] for tasks assessed/performed      Past Medical History  Diagnosis Date  . Ulcer   . Depression   . Occlusion and stenosis of carotid artery without mention of cerebral infarction   . Lumbago   . Thrombocytopenia, unspecified   . Other malaise and fatigue 11/21/2007  . Allergic rhinitis due to pollen 11/21/2007  . Contusion of toe 09/12/2008  . Anxiety state, unspecified 11/28/2008  . Brachial neuritis or radiculitis NOS   . Cramp of limb   . Abnormal involuntary movements(781.0)   . Acute upper respiratory infections of unspecified site   . Cervicalgia   . Conjunctivitis unspecified   . Sprain of hand, unspecified site   . Enthesopathy of unspecified site   . Pain in limb   . Essential and other specified forms of tremor   . Hip, thigh, leg, and ankle, insect bite, nonvenomous, without mention of infection(916.4)   . Chest pain, unspecified   . Sprain of neck   . Dysfunction of eustachian tube   . Costal chondritis   . Sleep disturbance, unspecified   . Contact dermatitis and other eczema due to plants (except food)   . Acute sinusitis, unspecified     Past Surgical History  Procedure Laterality Date  . Other surgical history  2002   ear surgery  . Colonoscopy  2008  . Stapedes surgery Right 2002    There were no vitals filed for this visit.  Visit Diagnosis:  Bradykinesia  Rigidity  Decreased coordination      Subjective Assessment - 06/12/14 1433    Subjective  Pt reports that daughter is helping him exercise   Pertinent History hx of anxiety and depression, diagnosed with Parkinsonism 2015   Patient Stated Goals improve writing and daily tasks   Currently in Pain? Yes   Pain Score --  mild discomfort   Pain Location Back   Pain Orientation Lower   Pain Descriptors / Indicators Aching   Pain Type Chronic pain   Pain Frequency Intermittent   Aggravating Factors  standing    Pain Relieving Factors stretching                    OT Treatments/Exercises (OP) - 06/12/14 0001    ADLs   Overall ADLs Instructed in compensation strategies for tremors including activating hands/feet.   Eating Instructed pt to hold fork closer to middle vs. end for increased control.   UB Dressing Instructed pt in strategy for doffing jacket and pt returned demo with improved movement/less bradykinesia using strategy   Functional Mobility Pt instructed in importance of keeping feet apart for better balance in standing and good support in sitting activities.  Home Maintenance Reviewed instruction on big movements with functional reaching/wiping/cleaning activities.   Writing practiced writing with min v.c. for use of strategies/PWR! hands.  Practiced making continuous "l" with focus on target and big movements.  Recommended pt try griping pen with foam grip a little tighter to activate muscles and decreased resting tremors.   ADL Comments Began checking STGs and discussing progress.                OT Education - 06/12/14 1434    Education Details modified quadraped PWR! ways to prevent future complications with exercise and awareness   Person(s) Educated Patient   Methods Explanation;Demonstration;Verbal  cues   Comprehension Verbalized understanding;Returned demonstration;Verbal cues required          OT Short Term Goals - 06/12/14 1216    OT SHORT TERM GOAL #1   Title Pt will be independent with PD-specific HEP for coordination, bradykinesia, rigidity.--due 06/06/14   Time 4   Period Weeks   Status Achieved  06/12/14   OT SHORT TERM GOAL #2   Title Pt will verbalize understanding of ways to prevent future complications related to PD and appropriate community resources.--due 06/06/14   Time 4   Period Weeks   Status Achieved  06/12/14   OT SHORT TERM GOAL #3   Title Pt will consistently be able to write 2-3sentence paragraph with  100% legibility in only min increased time/decrease in size.--due 06/06/14   Baseline pt reports inconsistent size affecting legibility and demo signifcantly increased time needed to write (limits ability to perform financial managemnt)   Time 4   Period Weeks   Status On-going  min-mod increased time, 100% legibility and no decrease in size   OT SHORT TERM GOAL #4   Title Pt will demo increased ease with dressing as shown by completing PPT#4 in 11sec or less--due 06/06/14   Baseline 14.68   Time 4   Period Weeks   Status Achieved  10.66sec with strategy after instruction           OT Long Term Goals - 05/07/14 1723    OT LONG TERM GOAL #1   Title Pt will verbalize understanding of strategies/AE to increase ease with ADLs/IADLs prn.--due 07/06/14   Time 8   Period Weeks   Status New   OT LONG TERM GOAL #2   Title Pt will improve coordination for ADLs as shown by improving time on 9-hole peg test by at least 8sec with RUE.--due 07/06/14   Baseline 37.50   Time 8   Period Weeks   Status New   OT LONG TERM GOAL #3   Title Pt will improve coordination for ADLs as shown by improving time on 9-hole peg test by at least 8sec with LUE.--due 07/06/14   Baseline 39.12   Time 8   Period Weeks   Status New   OT LONG TERM GOAL #4   Title Pt will improve  functional raching/coordination as shown by improving score by at least 6 on box and blocks test with LUE.----due 07/06/14   Baseline 39   Time 8   Period Weeks   Status New   OT LONG TERM GOAL #5   Title Pt will report increase ease with performing simple car maintenance/yardwork tasks mod I.--due 07/06/14   Time 8   Period Weeks   Status New               Plan - 06/12/14 1450    Clinical Impression Statement  Pt is progressing well with 3/4 STGs met. Pt demo improved size with writing and improved ability to don/doff jacket with adaptive strategy.  Pt would benefit from further reinforcement of large amplitude movements in various functional contexts for increased carryover for improved ADL performance/quality of life and prevention of future complications.   Plan simulated ADLs, check on approval for additional [employer] visits (requested 8 additional visits 06/12/14)   OT Home Exercise Plan has been issued:  PWR! hands, coordination HEP (3/31) big movments with ADLS, modified quadraped PWR! (06/08/14)   Consulted and Agree with Plan of Care Patient;Family member/caregiver   Family Member Consulted daughter        Problem List Patient Active Problem List   Diagnosis Date Noted  . Depression 07/26/2013  . Unsteady gait 07/26/2013  . Orthostatic hypotension 07/26/2013  . Anxiety 06/23/2013  . Chest pain 06/23/2013  . Tremor 05/18/2013  . Occlusion and stenosis of carotid artery without mention of cerebral infarction 08/30/2012    [facility] 06/12/2014, 2:59 PM  [facility] [address] [place], [place], [address] Phone: [phone]   Fax:  [place], OTR/L 06/12/2014 2:59 PM

## 2014-06-14 ENCOUNTER — Encounter: Payer: Medicare PPO | Admitting: Occupational Therapy

## 2014-06-15 ENCOUNTER — Ambulatory Visit: Payer: Medicare PPO | Admitting: Occupational Therapy

## 2014-06-15 DIAGNOSIS — R29898 Other symptoms and signs involving the musculoskeletal system: Secondary | ICD-10-CM

## 2014-06-15 DIAGNOSIS — R258 Other abnormal involuntary movements: Secondary | ICD-10-CM

## 2014-06-15 DIAGNOSIS — R490 Dysphonia: Secondary | ICD-10-CM | POA: Diagnosis not present

## 2014-06-15 DIAGNOSIS — R279 Unspecified lack of coordination: Secondary | ICD-10-CM

## 2014-06-15 DIAGNOSIS — R278 Other lack of coordination: Secondary | ICD-10-CM

## 2014-06-15 DIAGNOSIS — Z7409 Other reduced mobility: Secondary | ICD-10-CM

## 2014-06-15 NOTE — Therapy (Signed)
[facility] [address] [place], [place], [address] Phone: [phone]   Fax:  [phone]  Occupational Therapy Treatment  Patient Details  Name: Joseph Arroyo MRN: [id number] Date of Birth: [birth year] Referring Provider:  Ashok Norris, MD  Encounter Date: 06/15/2014      OT End of Session - 06/15/14 1254    Visit Number 7   Number of Visits 17   Date for OT Re-Evaluation 07/06/14   Authorization Type [employer], G-code needed   Authorization Time Period 05/07/14-06/21/14 approved 6 visits await approval for additional visits   Authorization - Visit Number 6   Authorization - Number of Visits 6   OT Start Time 1107   OT Stop Time 1207   OT Time Calculation (min) 60 min   Activity Tolerance Patient tolerated treatment well   Behavior During Therapy [facility] for tasks assessed/performed      Past Medical History  Diagnosis Date  . Ulcer   . Depression   . Occlusion and stenosis of carotid artery without mention of cerebral infarction   . Lumbago   . Thrombocytopenia, unspecified   . Other malaise and fatigue 11/21/2007  . Allergic rhinitis due to pollen 11/21/2007  . Contusion of toe 09/12/2008  . Anxiety state, unspecified 11/28/2008  . Brachial neuritis or radiculitis NOS   . Cramp of limb   . Abnormal involuntary movements(781.0)   . Acute upper respiratory infections of unspecified site   . Cervicalgia   . Conjunctivitis unspecified   . Sprain of hand, unspecified site   . Enthesopathy of unspecified site   . Pain in limb   . Essential and other specified forms of tremor   . Hip, thigh, leg, and ankle, insect bite, nonvenomous, without mention of infection(916.4)   . Chest pain, unspecified   . Sprain of neck   . Dysfunction of eustachian tube   . Costal chondritis   . Sleep disturbance, unspecified   . Contact dermatitis and other eczema due to plants (except food)   . Acute sinusitis,  unspecified     Past Surgical History  Procedure Laterality Date  . Other surgical history  2002    ear surgery  . Colonoscopy  2008  . Stapedes surgery Right 2002    There were no vitals filed for this visit.  Visit Diagnosis:  Bradykinesia  Rigidity  Decreased coordination  Decreased functional mobility and endurance      Subjective Assessment - 06/15/14 1251    Pertinent History hx of anxiety and depression, diagnosed with Parkinsonism 2015   Patient Stated Goals improve writing and daily tasks   Currently in Pain? Yes   Pain Score 3    Pain Location Back   Pain Orientation Lower   Pain Descriptors / Indicators Aching   Pain Type Chronic pain   Pain Onset More than a month ago   Pain Frequency Intermittent   Aggravating Factors  standing   Pain Relieving Factors stretching   Multiple Pain Sites No       Treatment: Self care: Education provided to pt./ Family regarding techniques for pulling down shirt, donning doffing jacket, cutting food,  and sliding a chair up under table.Pt practiced all activities incorporating big movements. Pt was provided with foam grips for utensils for feeding as  he demonstrates improved performance.  PWR! Moves modified quadraped, reviewed with pt/ family as a warm up 20 reps each. Pt. demonstrates improved performance.  OT Short Term Goals - 06/12/14 1216    OT SHORT TERM GOAL #1   Title Pt will be independent with PD-specific HEP for coordination, bradykinesia, rigidity.--due 06/06/14   Time 4   Period Weeks   Status Achieved  06/12/14   OT SHORT TERM GOAL #2   Title Pt will verbalize understanding of ways to prevent future complications related to PD and appropriate community resources.--due 06/06/14   Time 4   Period Weeks   Status Achieved  06/12/14   OT SHORT TERM GOAL #3   Title Pt will consistently be able to write 2-3sentence paragraph with  100% legibility in only min increased  time/decrease in size.--due 06/06/14   Baseline pt reports inconsistent size affecting legibility and demo signifcantly increased time needed to write (limits ability to perform financial managemnt)   Time 4   Period Weeks   Status On-going  min-mod increased time, 100% legibility and no decrease in size   OT SHORT TERM GOAL #4   Title Pt will demo increased ease with dressing as shown by completing PPT#4 in 11sec or less--due 06/06/14   Baseline 14.68   Time 4   Period Weeks   Status Achieved  10.66sec with strategy after instruction           OT Long Term Goals - 05/07/14 1723    OT LONG TERM GOAL #1   Title Pt will verbalize understanding of strategies/AE to increase ease with ADLs/IADLs prn.--due 07/06/14   Time 8   Period Weeks   Status New   OT LONG TERM GOAL #2   Title Pt will improve coordination for ADLs as shown by improving time on 9-hole peg test by at least 8sec with RUE.--due 07/06/14   Baseline 37.50   Time 8   Period Weeks   Status New   OT LONG TERM GOAL #3   Title Pt will improve coordination for ADLs as shown by improving time on 9-hole peg test by at least 8sec with LUE.--due 07/06/14   Baseline 39.12   Time 8   Period Weeks   Status New   OT LONG TERM GOAL #4   Title Pt will improve functional raching/coordination as shown by improving score by at least 6 on box and blocks test with LUE.----due 07/06/14   Baseline 39   Time 8   Period Weeks   Status New   OT LONG TERM GOAL #5   Title Pt will report increase ease with performing simple car maintenance/yardwork tasks mod I.--due 07/06/14   Time 8   Period Weeks   Status New               Plan - 06/15/14 1254    Clinical Impression Statement Pt is progressing towards goals. Await insurance authorization for additional visits.   Plan large amplitude movements   OT Home Exercise Plan has been issued:  PWR! hands, coordination HEP (3/31) big movments with ADLS, modified quadraped PWR! (06/08/14)    Consulted and Agree with Plan of Care Patient;Family member/caregiver   Family Member Consulted daughter, wife        Problem List Patient Active Problem List   Diagnosis Date Noted  . Depression 07/26/2013  . Unsteady gait 07/26/2013  . Orthostatic hypotension 07/26/2013  . Anxiety 06/23/2013  . Chest pain 06/23/2013  . Tremor 05/18/2013  . Occlusion and stenosis of carotid artery without mention of cerebral infarction 08/30/2012    RINE,KATHRYN 06/15/2014, 12:57 PM Theone Murdoch, OTR/L [phone]  Phone: [phone] 12:57 PM 06/15/2014 [facility] [address] [place], [place], [address] Phone: [phone]   Fax:  [phone]

## 2014-06-20 ENCOUNTER — Ambulatory Visit: Payer: Medicare PPO | Admitting: Occupational Therapy

## 2014-06-20 DIAGNOSIS — R258 Other abnormal involuntary movements: Secondary | ICD-10-CM

## 2014-06-20 DIAGNOSIS — R29898 Other symptoms and signs involving the musculoskeletal system: Secondary | ICD-10-CM

## 2014-06-20 DIAGNOSIS — Z7409 Other reduced mobility: Secondary | ICD-10-CM

## 2014-06-20 DIAGNOSIS — R279 Unspecified lack of coordination: Principal | ICD-10-CM

## 2014-06-20 DIAGNOSIS — R490 Dysphonia: Secondary | ICD-10-CM | POA: Diagnosis not present

## 2014-06-20 DIAGNOSIS — R278 Other lack of coordination: Secondary | ICD-10-CM

## 2014-06-20 NOTE — Patient Instructions (Signed)
Bag Exercises:  Small trash bag or produce bag works best.  For all exercises, sit with big posture (sit up tall with head up) and use big movements. Perform the following exercises 1 times per day.   Hold end of bag in one hand. Stretch fingers out big to draw the entire bag into your palm. Repeat 5 times with each hand.  Hold bag in one hand. Stretch both arms/hands out to the side as big as you can. Then, pass bag from one hand to the other IN FRONT of you. Stretch arms back out big after each pass. Repeat 10 times.  Hold bag in one hand. Stretch both arms/hands out to the side as big as you can. Then, pass bag from one hand to the other BEHIND you. Stretch arms back out big after each pass. Repeat 10 times.  Hold bag in both hands in front of you with hands/arms shoulder length apart. Move bag behind your head. Repeat 10 times.  Hold bag in both hands in front of you with hands/arms shoulder length apart. Lift leg and move bag completely under each foot and back. Repeat 10 times on each side. 

## 2014-06-20 NOTE — Therapy (Signed)
[facility] [address] [place], [place], [address] Phone: [phone]   Fax:  [phone]  Occupational Therapy Treatment  Patient Details  Name: Joseph Arroyo MRN: [id number] Date of Birth: [birth year] Referring Provider:  Ashok Norris, MD  Encounter Date: 06/20/2014      OT End of Session - 06/20/14 1306    Visit Number 8   Number of Visits 17   Date for OT Re-Evaluation 07/06/14   Authorization Type [employer], G-code needed   Authorization [employer] approved an additional 4 visits 4/12-5/27/16   Authorization - Visit Number 7   Authorization - Number of Visits 10   OT Start Time 5809   OT Stop Time 1146   OT Time Calculation (min) 43 min   Activity Tolerance Patient tolerated treatment well   Behavior During Therapy [facility] for tasks assessed/performed      Past Medical History  Diagnosis Date  . Ulcer   . Depression   . Occlusion and stenosis of carotid artery without mention of cerebral infarction   . Lumbago   . Thrombocytopenia, unspecified   . Other malaise and fatigue 11/21/2007  . Allergic rhinitis due to pollen 11/21/2007  . Contusion of toe 09/12/2008  . Anxiety state, unspecified 11/28/2008  . Brachial neuritis or radiculitis NOS   . Cramp of limb   . Abnormal involuntary movements(781.0)   . Acute upper respiratory infections of unspecified site   . Cervicalgia   . Conjunctivitis unspecified   . Sprain of hand, unspecified site   . Enthesopathy of unspecified site   . Pain in limb   . Essential and other specified forms of tremor   . Hip, thigh, leg, and ankle, insect bite, nonvenomous, without mention of infection(916.4)   . Chest pain, unspecified   . Sprain of neck   . Dysfunction of eustachian tube   . Costal chondritis   . Sleep disturbance, unspecified   . Contact dermatitis and other eczema due to plants (except food)   . Acute sinusitis, unspecified     Past  Surgical History  Procedure Laterality Date  . Other surgical history  2002    ear surgery  . Colonoscopy  2008  . Stapedes surgery Right 2002    There were no vitals filed for this visit.  Visit Diagnosis:  Decreased coordination  Rigidity  Bradykinesia  Decreased functional mobility and endurance      Subjective Assessment - 06/20/14 1113    Subjective  Ptreports he sees Dr Rexene Alberts next week   Pertinent History hx of anxiety and depression, diagnosed with Parkinsonism 2015   Patient Stated Goals improve writing and daily tasks   Currently in Pain? Yes   Pain Score 3    Pain Location Back   Pain Orientation Lower   Pain Descriptors / Indicators Aching   Pain Type Chronic pain   Pain Onset More than a month ago   Pain Frequency Intermittent   Aggravating Factors  standing    Pain Relieving Factors stretching   Multiple Pain Sites No     Treatment: PWR hands followed by flipping playing cards and dealing with thumb with emphasis on big movements, min v.c./demonstration. Pt reports suing big movements for cutting food. Arm bike x 6 mins level 1 for conditioning, min v.c. For speed pt was able to maintain 38-44 RPM. Simulated ADLS with large amplitude movments with bag exercises, min v.c. Therapist provided as HEP, but pt was instructed that he  does not need to perform daily. Pt's wife was present, pt was cautioned against riding his bicycle due to fall risk.                          OT Education - 06/20/14 1303    Education provided Yes   Education Details  bag exercises with large amplitude movements   Person(s) Educated Patient;Spouse   Methods Explanation;Demonstration;Verbal cues;Handout   Comprehension Verbalized understanding;Returned demonstration          OT Short Term Goals - 06/12/14 1216    OT SHORT TERM GOAL #1   Title Pt will be independent with PD-specific HEP for coordination, bradykinesia, rigidity.--due 06/06/14   Time 4    Period Weeks   Status Achieved  06/12/14   OT SHORT TERM GOAL #2   Title Pt will verbalize understanding of ways to prevent future complications related to PD and appropriate community resources.--due 06/06/14   Time 4   Period Weeks   Status Achieved  06/12/14   OT SHORT TERM GOAL #3   Title Pt will consistently be able to write 2-3sentence paragraph with  100% legibility in only min increased time/decrease in size.--due 06/06/14   Baseline pt reports inconsistent size affecting legibility and demo signifcantly increased time needed to write (limits ability to perform financial managemnt)   Time 4   Period Weeks   Status On-going  min-mod increased time, 100% legibility and no decrease in size   OT SHORT TERM GOAL #4   Title Pt will demo increased ease with dressing as shown by completing PPT#4 in 11sec or less--due 06/06/14   Baseline 14.68   Time 4   Period Weeks   Status Achieved  10.66sec with strategy after instruction           OT Long Term Goals - 05/07/14 1723    OT LONG TERM GOAL #1   Title Pt will verbalize understanding of strategies/AE to increase ease with ADLs/IADLs prn.--due 07/06/14   Time 8   Period Weeks   Status New   OT LONG TERM GOAL #2   Title Pt will improve coordination for ADLs as shown by improving time on 9-hole peg test by at least 8sec with RUE.--due 07/06/14   Baseline 37.50   Time 8   Period Weeks   Status New   OT LONG TERM GOAL #3   Title Pt will improve coordination for ADLs as shown by improving time on 9-hole peg test by at least 8sec with LUE.--due 07/06/14   Baseline 39.12   Time 8   Period Weeks   Status New   OT LONG TERM GOAL #4   Title Pt will improve functional raching/coordination as shown by improving score by at least 6 on box and blocks test with LUE.----due 07/06/14   Baseline 39   Time 8   Period Weeks   Status New   OT LONG TERM GOAL #5   Title Pt will report increase ease with performing simple car maintenance/yardwork tasks  mod I.--due 07/06/14   Time 8   Period Weeks   Status New               Plan - 06/20/14 1313    Clinical Impression Statement Pt is progressing towards goals. Reinforce large amplitude movments with ADLS.   Plan pt to bring in the HEPs he is performing at home, and consider issuing seated PWR. Large amplitude movements with ADLS/ IADLS.  OT Home Exercise Plan has been issued:  PWR! hands, coordination HEP (3/31) big movments with ADLS, modified quadraped PWR! (06/08/14)bag exercises (06/20/14)   Consulted and Agree with Plan of Care Patient;Family member/caregiver   Family Member Consulted wife        Problem List Patient Active Problem List   Diagnosis Date Noted  . Depression 07/26/2013  . Unsteady gait 07/26/2013  . Orthostatic hypotension 07/26/2013  . Anxiety 06/23/2013  . Chest pain 06/23/2013  . Tremor 05/18/2013  . Occlusion and stenosis of carotid artery without mention of cerebral infarction 08/30/2012    RINE,KATHRYN 06/20/2014, 1:16 PM Theone Murdoch, OTR/L [phone] Phone: [phone] 1:16 PM 06/20/2014 [facility] [address] [place], [place], [address] Phone: [phone]   Fax:  [phone]

## 2014-06-25 ENCOUNTER — Ambulatory Visit: Payer: Medicare PPO | Admitting: Occupational Therapy

## 2014-06-26 ENCOUNTER — Encounter: Payer: Self-pay | Admitting: Neurology

## 2014-06-26 ENCOUNTER — Ambulatory Visit: Payer: Medicare PPO | Admitting: Occupational Therapy

## 2014-06-26 ENCOUNTER — Ambulatory Visit (INDEPENDENT_AMBULATORY_CARE_PROVIDER_SITE_OTHER): Payer: Medicare PPO | Admitting: Neurology

## 2014-06-26 VITALS — BP 100/65 | HR 57 | Resp 14 | Ht 67.0 in | Wt 142.0 lb

## 2014-06-26 DIAGNOSIS — R251 Tremor, unspecified: Secondary | ICD-10-CM

## 2014-06-26 DIAGNOSIS — F411 Generalized anxiety disorder: Secondary | ICD-10-CM | POA: Diagnosis not present

## 2014-06-26 DIAGNOSIS — H269 Unspecified cataract: Secondary | ICD-10-CM | POA: Diagnosis not present

## 2014-06-26 DIAGNOSIS — R490 Dysphonia: Secondary | ICD-10-CM | POA: Diagnosis not present

## 2014-06-26 DIAGNOSIS — G20 Parkinson's disease: Secondary | ICD-10-CM | POA: Diagnosis not present

## 2014-06-26 DIAGNOSIS — R279 Unspecified lack of coordination: Secondary | ICD-10-CM

## 2014-06-26 DIAGNOSIS — R278 Other lack of coordination: Secondary | ICD-10-CM

## 2014-06-26 DIAGNOSIS — R29898 Other symptoms and signs involving the musculoskeletal system: Secondary | ICD-10-CM

## 2014-06-26 DIAGNOSIS — R258 Other abnormal involuntary movements: Secondary | ICD-10-CM

## 2014-06-26 NOTE — Progress Notes (Signed)
Subjective:    Patient ID: Joseph Arroyo is a 74 y.o. male.  HPI     Interim history:   Joseph Arroyo is a 74 year old right-handed gentleman who presents for follow-up consultation of his gait disorder. He is accompanied by his wife again today. I last saw him on 03/28/2014, at which time he reported feeling more anxious. He had started physical therapy which he felt was helpful. He requested to do occupational therapy as well. He had some coughing when eating but no actual choking spells. He had been started on Mirapex 0.125 mg 3 times a day by his psychiatrist. He felt a little sleepy from this. He felt more sleepy after taking his Luvox. He was on Xanax 0.25 mg 2-1/2 pills daily on average in divided doses. He had been started on gabapentin by his psychiatrist. He was taking 100 mg 3 times a day. His tremors were primarily left-sided.  He was off of Sinemet. I suggested we gradually increase his Mirapex to 2 pills 3 times a day. In the interim, he called on 05/08/2014 to inform us that he was being taken off of Luvox and starting Paxil.  Today, 06/26/2014: He is able to provide most of his own history. His wife provides details. He reports that he reduced the gabapentin to 100 mg bid on his own. He is now on Paxil 40 mg daily and has been on off of Luvox since late March. He has been on Mirapex 0.125 mg 2 pills tid. He does have residual fatigue issues and anxiety issues.  He still exercises extended periods of time. He goes to the gym 3 times a week but may exercise more than an hour each time. He has lost some weight. His appetite is good but he may be burning too many calories to keep up.  Previously:   I first met him on 01/17/2014, at which time he reported seeing Joseph Arroyo for his depression and anxiety for some years. He on generic Luvox, 250 mg, increased from 200 mg some 3 weeks . He stopped Cymbalta. He had been off of Zyprexa since October 2015. He denied any RBD type  symptoms, hallucinations, and memory loss. He was in the process of tapering of Sinemet at the time. He had no negative repercussions while tapering Sinemet as far as he could tell. His anxiety was worse. He has a long-standing history of major depression and severe anxiety. Dr. Janann Arroyo had talked to the patient and his family about potentially doing a DaT scan. However, this would be difficult to pursue because he is on antidepressants. I talked to him and his family about this dilemma. He had mild parkinsonism, possibly with left-sided lateralization. I asked him to continue with the Sinemet taper. He called in December reporting more tremors. I asked him to restart Sinemet. He called back reporting that he was started on Mirapex by his psychiatrist. I asked him to continue with that and hold the Sinemet which she had not restarted at the time.  He previously followed with Dr. Jim Arroyo. He has an underlying medical history of depression, anxiety, carotid artery disease, lumbar spine degenerative disease, neck pain, allergic rhinitis, last seen by Dr. Janann Arroyo on 11/21/2013 and before then he was seen in April 2015. I reviewed Dr. Hazle Arroyo notes. The patient originally presented with new onset tremors affecting his legs and his hands. Symptoms started a little over a year ago. 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 His  wife had noted slowness in his walking and difficulty with fine motor tasks. He complained of muscle cramping in his legs at night. Tremor became worse with anxiety or stress. There was no report of REM behavior disorder. A maternal uncle may have had Parkinson's disease. He was on fluvoxamine in the past which was switched to Brintellix. He had been on lithium. He was on Zyprexa which was stopped. Luvox was restarted. At the last visit with Dr. Janann Arroyo in September 2015 the patient reported some falls. He was tripping over his feet. His tremor had become worse. Dr. Janann Arroyo felt that was  a parkinsonian tremor at the time. He ordered a head CT as patient was unable to get an MRI because of in her ear implants. He tried the patient on Sinemet 25-100 milligrams strength one pill 3 times a day and referred the patient for physical therapy. In the interim, the patient called with worsening tremors and anxiety while on Sinemet and I suggested he taper off of it. The patient had a brain MRI without contrast on 12/07/2013 and I reviewed the test results: This was done at [facility]: No evidence of acute intracranial abnormality or mass. Mild cerebral atrophy and minimal chronic small vessel ischemic disease.   His Past Medical History Is Significant For: Past Medical History  Diagnosis Date  . Ulcer   . Depression   . Occlusion and stenosis of carotid artery without mention of cerebral infarction   . Lumbago   . Thrombocytopenia, unspecified   . Other malaise and fatigue 11/21/2007  . Allergic rhinitis due to pollen 11/21/2007  . Contusion of toe 09/12/2008  . Anxiety state, unspecified 11/28/2008  . Brachial neuritis or radiculitis NOS   . Cramp of limb   . Abnormal involuntary movements(781.0)   . Acute upper respiratory infections of unspecified site   . Cervicalgia   . Conjunctivitis unspecified   . Sprain of hand, unspecified site   . Enthesopathy of unspecified site   . Pain in limb   . Essential and other specified forms of tremor   . Hip, thigh, leg, and ankle, insect bite, nonvenomous, without mention of infection(916.4)   . Chest pain, unspecified   . Sprain of neck   . Dysfunction of eustachian tube   . Costal chondritis   . Sleep disturbance, unspecified   . Contact dermatitis and other eczema due to plants (except food)   . Acute sinusitis, unspecified     His Past Surgical History Is Significant For: Past Surgical History  Procedure Laterality Date  . Other surgical history  2002    ear surgery  . Colonoscopy  2008  . Stapedes surgery Right 2002     His Family History Is Significant For: Family History  Problem Relation Age of Onset  . Heart failure Mother     His Social History Is Significant For: History   Social History  . Marital Status: Married    Spouse Name: [name]   . Number of Children: 2  . Years of Education: 12   Occupational History  .    [employer] Retired     Social History Main Topics  . Smoking status: Never Smoker   . Smokeless tobacco: Never Used  . Alcohol Use: No  . Drug Use: No  . Sexual Activity: Not on file   Other Topics Concern  . None   Social History Narrative   Married to [name], has 2 children   Right handed   Master's  plus    2 cups daily    His Allergies Are:  Allergies  Allergen Reactions  . Prednisone     Hyperactivity   . Wheat Bran   :   His Current Medications Are:  Outpatient Encounter Prescriptions as of 06/26/2014  Medication Sig  . ALPRAZolam (XANAX) 0.25 MG tablet Take 0.25 mg by mouth 3 (three) times daily.   . B Complex Vitamins (VITAMIN B COMPLEX PO) Take 1 tablet by mouth daily.  . Capsicum-Garlic 694-854 MG CAPS Take 200-300 mg by mouth.  . Coenzyme Q10 (COQ-10) 100 MG CAPS Take by mouth daily.  . fludrocortisone (FLORINEF) 0.1 MG tablet   . gabapentin (NEURONTIN) 100 MG capsule Take 100 mg by mouth 2 (two) times daily.   . Ginkgo Biloba (GNP GINGKO BILOBA EXTRACT PO) Take 120 mg by mouth.  . Lutein 10 MG TABS Take 1 tablet by mouth 3 (three) times daily.  . Multiple Vitamins-Minerals (MULTIVITAMIN WITH MINERALS) tablet Take 1 tablet by mouth daily.  Marland Kitchen PARoxetine (PAXIL) 40 MG tablet Take 40 mg by mouth daily.  . pramipexole (MIRAPEX) 0.125 MG tablet Take 1 in AM, 1 at lunch, and 2 at night for one week, then increase by 1 pill every week to 2 pills 3 times a day. (Patient taking differently: 3 (three) times daily. Take 2 tabs 3 times a day.)  . Saw Palmetto, Serenoa repens, 1000 MG CAPS Take 2 capsules by mouth daily.  . [DISCONTINUED] fluvoxaMINE (LUVOX) 100 MG  tablet 150 mg daily. 50 am, 200 pm  . [DISCONTINUED] PARoxetine (PAXIL) 30 MG tablet Take 30 mg by mouth daily.  :  Review of Systems:  Out of a complete 14 point review of systems, all are reviewed and negative with the exception of these symptoms as listed below:   Review of Systems  Constitutional: Positive for fatigue.  Musculoskeletal: Positive for back pain.  Neurological: Positive for tremors.       "shaky hand writing", unsteady gait  Psychiatric/Behavioral:       Anxiety    Objective:  Neurologic Exam  Physical Exam Physical Examination:   Filed Vitals:   06/26/14 1332  BP: 100/65  Pulse: 57  Resp: 14    General Examination: The patient is a very pleasant 74 y.o. male in no acute distress. He is situated in his chair. He is very anxious appearing again today.   HEENT: Normocephalic, atraumatic, pupils are equal, round and reactive to light and accommodation. Funduscopic exam is normal with sharp disc margins noted. Extraocular tracking shows mild saccadic breakdown without nystagmus noted. There is limitation to upper gaze. There is mild decrease in eye blink rate. Hearing is intact. Face is symmetric with mild facial masking and normal facial sensation. There is an intermittent lower jaw tremor. Neck is moderately rigid with intact passive ROM. There are no carotid bruits on auscultation. Oropharynx exam reveals mild mouth dryness. No significant airway crowding is noted. Mallampati is class II. Tongue protrudes centrally and palate elevates symmetrically. There is no drooling.   Chest: is clear to auscultation without wheezing, rhonchi or crackles noted.  Heart: sounds are regular and normal without murmurs, rubs or gallops noted.   Abdomen: is soft, non-tender and non-distended with normal bowel sounds appreciated on auscultation.  Extremities: There is no pitting edema in the distal lower extremities bilaterally. Pedal pulses are intact.   Skin: is warm and dry  with no trophic changes noted. Age-related changes are noted on the skin.  Musculoskeletal: exam reveals no obvious joint deformities, tenderness, joint swelling or erythema.  Neurologically:  Mental status: The patient is awake and alert, paying good  attention. He is able to provide the history and seems to calm down as our visit progresses. His wife provides some details. He is oriented to: person, place, time/date, situation, day of week, month of year and year. His memory, attention, language and knowledge are not impaired. There is no aphasia, agnosia, apraxia or anomia. There is a mild degree of bradyphrenia. Speech is moderately hypophonic with no dysarthria noted. Mood is mildly depressed appearing and affect is constricted.   Cranial nerves are as described above under HEENT exam. In addition, shoulder shrug is normal with equal shoulder height noted.  Motor exam: Normal bulk, and strength for age is noted. There are no dyskinesias noted. Tone is mildly rigid with presence of cogwheeling in the bilateral upper extremities. There is overall mild bradykinesia. There is no drift or rebound.  There is a moderate degree of resting tremor in the left lower extremity, a mild degree of resting tremor in the right lower extremity and a slight intermittent trembling in both upper extremities, all unchanged.  Romberg is negative.  Reflexes are 1+ in the upper extremities and 1+ in the lower extremities. Toes are downgoing bilaterally.  Fine motor skills exam: Finger taps are mildly impaired on the right and moderately impaired on the left. Hand movements are mildly impaired on the right and mildly impaired on the left. RAP (rapid alternating patting) is mildly impaired on the right and mildly impaired on the left. Foot taps are mildly impaired on the right and moderately impaired on the left. Foot agility (in the form of heel stomping) is mildly impaired on the right and mildly impaired on the left.     Cerebellar testing shows no dysmetria or intention tremor on finger to nose testing. Heel to shin is unremarkable bilaterally. There is no truncal or gait ataxia.   Sensory exam is intact to light touch, PP, temperature and vibration sense.  Gait, station and balance: He stands up from the seated position with mild difficulty and needs to push up with His hands. He needs no assistance. No veering to one side is noted. He is noted to lean to the right side. Posture is mildly stooped, somewhat advanced for age. Stance is narrow-based. He walks with better stride length and pace than last time and slightly decreased arm swing on the left. Tandem walk is possible with difficulty. Balance is mildly impaired.    Assessment and plan:   In summary, Joseph Arroyo is a very pleasant 74 year old male with a history of underlying medical history of depression, anxiety, carotid artery disease, lumbar spine degenerative disease, neck pain, allergic rhinitis, who presents for follow-up consultation of his tremor, gait disorder, slowness and other parkinsonian symptoms. His history and physical exam are concerning for parkinsonism, questionable left-sided predominant Parkinson's disease. So far he has not had much in the way of medication response to levodopa (albeit lower dose) and is currently on low-dose Mirapex. His symptoms date back to over a year ago. The situation is confounded and complicated by his long-standing history of anxiety and depression and flareup of anxiety and different treatments tried for his mood disorder, some of which can exacerbate tremors, some of which can actually induced parkinsonism (Zyprexa). I would have liked to pursue a DaT scan, but realistically this will be hard to do, because he have to come  off of his antidepressant.  At this juncture, he is off of Sinemet, and on Mirapex 0.125 mg 2 pills tid. He has been on gabapentin 100 mg bid. He has been on Paxil 40 mg daily and off  Luvox for about a month.  He is advised that it may take up to 8 weeks for an SSRI to take full effect. We may increase the Mirapex further to 0.375 mg tid and then 0.5 mg tid, down the road. He is still quite anxious. He goes to the gym 3 times a week. He is advised stay well-hydrated. He is advised not to overdo it.  Any medication changes will take time to be effective. We will try to increase his Mirapex and see how it goes. We may revisit levodopa therapy down the road.  I will see him back in 3-4 months. I answered all their questions today and the patient and his wife were in agreement.  I spent 25 min in total face-to-face time with the patient, more 50% of which was spent in counseling and coordination of care, reviewing test results, reviewing medication and reviewing the diagnosis of parkinsonism, its prognosis and treatment options.

## 2014-06-27 NOTE — Therapy (Signed)
[facility] [address] [place], [place], [address] Phone: [phone]   Fax:  [phone]  Occupational Therapy Treatment  Patient Details  Name: Joseph Arroyo MRN: [id number] Date of Birth: 02-06-1941 Referring Provider:  Ashok Norris, MD  Encounter Date: 06/26/2014      OT End of Session - 06/27/14 0814    Visit Number 9   Number of Visits 17   Date for OT Re-Evaluation 07/06/14   Authorization Type [employer], G-code needed   Authorization Time [place] approved an additional 4 visits 4/12-5/27/16 G code next visit   Authorization - Visit Number 8   Authorization - Number of Visits 10   Activity Tolerance Patient tolerated treatment well   Behavior During Therapy [facility] for tasks assessed/performed      Past Medical History  Diagnosis Date  . Ulcer   . Depression   . Occlusion and stenosis of carotid artery without mention of cerebral infarction   . Lumbago   . Thrombocytopenia, unspecified   . Other malaise and fatigue 11/21/2007  . Allergic rhinitis due to pollen 11/21/2007  . Contusion of toe 09/12/2008  . Anxiety state, unspecified 11/28/2008  . Brachial neuritis or radiculitis NOS   . Cramp of limb   . Abnormal involuntary movements(781.0)   . Acute upper respiratory infections of unspecified site   . Cervicalgia   . Conjunctivitis unspecified   . Sprain of hand, unspecified site   . Enthesopathy of unspecified site   . Pain in limb   . Essential and other specified forms of tremor   . Hip, thigh, leg, and ankle, insect bite, nonvenomous, without mention of infection(916.4)   . Chest pain, unspecified   . Sprain of neck   . Dysfunction of eustachian tube   . Costal chondritis   . Sleep disturbance, unspecified   . Contact dermatitis and other eczema due to plants (except food)   . Acute sinusitis, unspecified     Past Surgical History  Procedure Laterality Date  . Other  surgical history  2002    ear surgery  . Colonoscopy  2008  . Stapedes surgery Right 2002    There were no vitals filed for this visit.  Visit Diagnosis:  Rigidity  Bradykinesia  Decreased coordination      Subjective Assessment - 06/26/14 1452    Subjective  (p) Pt saw [name], she did not change any meds   Pertinent History (p) hx of anxiety and depression, diagnosed with Parkinsonism 2015   Patient Stated Goals (p) improve writing and daily tasks   Pain Score (p) 3    Pain Orientation (p) Lower     Treatment: therapist reviewed PWR! Exercises in seated and standing that had previously been issued by P.T. Arm bike 6 mins level for conditioning, min v.c.to maintain 40 RPM. Discussion with pt/ wife regarding importance of performing PWR! exercises to maintain flexibility and to prevent further PD-related complications. Pt verbalized understanding. Discussion with pt/ wife regarding not overdoing it at that gym, pt's wife reports taht [name] is concerned about his 18 lbs weight loss since Jan.                    [facility]) - 06/27/14 4403    PWR! exercises Moves in sitting;Moves in standing   PWR! Up 10   PWR! Rock 10   PWR! Twist 10   PWR Step 10   Comments min v.c./ demonstration  PWR! Up 10   PWR! Rock 10   PWR! Step 10   Comments min v.c./ standing             OT Education - 06/27/14 0822    Education provided Yes   Education Details PWR! seated, standing, ways to prevent further PD-related complications.   Person(s) Educated Patient;Spouse   Methods Explanation;Demonstration  Pt has handout   Comprehension Verbalized understanding;Returned demonstration;Verbal cues required          OT Short Term Goals - 06/12/14 1216    OT SHORT TERM GOAL #1   Title Pt will be independent with PD-specific HEP for coordination, bradykinesia, rigidity.--due 06/06/14   Time 4   Period Weeks   Status Achieved  06/12/14   OT SHORT TERM GOAL #2    Title Pt will verbalize understanding of ways to prevent future complications related to PD and appropriate community resources.--due 06/06/14   Time 4   Period Weeks   Status Achieved  06/12/14   OT SHORT TERM GOAL #3   Title Pt will consistently be able to write 2-3sentence paragraph with  100% legibility in only min increased time/decrease in size.--due 06/06/14   Baseline pt reports inconsistent size affecting legibility and demo signifcantly increased time needed to write (limits ability to perform financial managemnt)   Time 4   Period Weeks   Status On-going  min-mod increased time, 100% legibility and no decrease in size   OT SHORT TERM GOAL #4   Title Pt will demo increased ease with dressing as shown by completing PPT#4 in 11sec or less--due 06/06/14   Baseline 14.68   Time 4   Period Weeks   Status Achieved  10.66sec with strategy after instruction           OT Long Term Goals - 05/07/14 1723    OT LONG TERM GOAL #1   Title Pt will verbalize understanding of strategies/AE to increase ease with ADLs/IADLs prn.--due 07/06/14   Time 8   Period Weeks   Status New   OT LONG TERM GOAL #2   Title Pt will improve coordination for ADLs as shown by improving time on 9-hole peg test by at least 8sec with RUE.--due 07/06/14   Baseline 37.50   Time 8   Period Weeks   Status New   OT LONG TERM GOAL #3   Title Pt will improve coordination for ADLs as shown by improving time on 9-hole peg test by at least 8sec with LUE.--due 07/06/14   Baseline 39.12   Time 8   Period Weeks   Status New   OT LONG TERM GOAL #4   Title Pt will improve functional raching/coordination as shown by improving score by at least 6 on box and blocks test with LUE.----due 07/06/14   Baseline 39   Time 8   Period Weeks   Status New   OT LONG TERM GOAL #5   Title Pt will report increase ease with performing simple car maintenance/yardwork tasks mod I.--due 07/06/14   Time 8   Period Weeks   Status New                Plan - 06/27/14 6948    Clinical Impression Statement Pt is progressing towards goals. Pt can benefit from reinforcement of large amplitude movments with ADLS/IADLS.   Plan Large amplitude movments with ADLS.   OT Home Exercise Plan has been issued:  PWR! hands, coordination HEP (3/31) big movments with ADLS, modified  quadraped PWR! (06/08/14)bag exercises (06/20/14)   Consulted and Agree with Plan of Care Patient;Family member/caregiver   Family Member Consulted wife        Problem List Patient Active Problem List   Diagnosis Date Noted  . Depression 07/26/2013  . Unsteady gait 07/26/2013  . Orthostatic hypotension 07/26/2013  . Anxiety 06/23/2013  . Chest pain 06/23/2013  . Tremor 05/18/2013  . Occlusion and stenosis of carotid artery without mention of cerebral infarction 08/30/2012    Fabienne Nolasco 06/27/2014, 8:23 AM Theone Murdoch, OTR/L [phone] Phone: [phone] 8:23 AM 06/27/2014 [facility] [address] [place], [place], [address] Phone: [phone]   Fax:  [phone]

## 2014-06-28 ENCOUNTER — Ambulatory Visit: Payer: Medicare PPO | Admitting: Neurology

## 2014-06-29 ENCOUNTER — Ambulatory Visit: Payer: Medicare PPO | Admitting: Occupational Therapy

## 2014-06-29 DIAGNOSIS — R258 Other abnormal involuntary movements: Secondary | ICD-10-CM

## 2014-06-29 DIAGNOSIS — Z7409 Other reduced mobility: Secondary | ICD-10-CM

## 2014-06-29 DIAGNOSIS — R29898 Other symptoms and signs involving the musculoskeletal system: Secondary | ICD-10-CM

## 2014-06-29 DIAGNOSIS — R278 Other lack of coordination: Secondary | ICD-10-CM

## 2014-06-29 DIAGNOSIS — R490 Dysphonia: Secondary | ICD-10-CM | POA: Diagnosis not present

## 2014-06-29 DIAGNOSIS — R279 Unspecified lack of coordination: Principal | ICD-10-CM

## 2014-06-29 NOTE — Therapy (Signed)
[facility] [address] [place], [place], [address] Phone: [phone]   Fax:  [phone]  Occupational Therapy Treatment  Patient Details  Name: Joseph Arroyo MRN: [id number] Date of Birth: [birth year] Referring Provider:  Ashok Norris, MD  Encounter Date: 06/29/2014      OT End of Session - 06/29/14 1655    Visit Number 10   Number of Visits 17   Date for OT Re-Evaluation 07/06/14   Authorization Time Period [employer] approved an additional 4 visits 4/12-5/27/16 G code next visit   Authorization - Visit Number 9   Authorization - Number of Visits 10   OT Start Time 9833   OT Stop Time 1530   OT Time Calculation (min) 42 min   Activity Tolerance Patient tolerated treatment well   Behavior During Therapy [facility] for tasks assessed/performed      Past Medical History  Diagnosis Date  . Ulcer   . Depression   . Occlusion and stenosis of carotid artery without mention of cerebral infarction   . Lumbago   . Thrombocytopenia, unspecified   . Other malaise and fatigue 11/21/2007  . Allergic rhinitis due to pollen 11/21/2007  . Contusion of toe 09/12/2008  . Anxiety state, unspecified 11/28/2008  . Brachial neuritis or radiculitis NOS   . Cramp of limb   . Abnormal involuntary movements(781.0)   . Acute upper respiratory infections of unspecified site   . Cervicalgia   . Conjunctivitis unspecified   . Sprain of hand, unspecified site   . Enthesopathy of unspecified site   . Pain in limb   . Essential and other specified forms of tremor   . Hip, thigh, leg, and ankle, insect bite, nonvenomous, without mention of infection(916.4)   . Chest pain, unspecified   . Sprain of neck   . Dysfunction of eustachian tube   . Costal chondritis   . Sleep disturbance, unspecified   . Contact dermatitis and other eczema due to plants (except food)   . Acute sinusitis, unspecified     Past Surgical History   Procedure Laterality Date  . Other surgical history  2002    ear surgery  . Colonoscopy  2008  . Stapedes surgery Right 2002    There were no vitals filed for this visit.  Visit Diagnosis:  Decreased coordination  Bradykinesia  Rigidity  Decreased functional mobility and endurance      Subjective Assessment - 06/29/14 1447    Subjective  My anxiety has been pretty high   Pertinent History hx of anxiety and depression, diagnosed with Parkinsonism 2015   Patient Stated Goals improve writing and daily tasks   Currently in Pain? Yes   Pain Score 4    Pain Location Back   Pain Orientation Lower   Pain Descriptors / Indicators Aching   Pain Type Chronic pain   Pain Onset More than a month ago   Pain Frequency Intermittent   Aggravating Factors  standing   Pain Relieving Factors stretching   Multiple Pain Sites No                      OT Treatments/Exercises (OP) - 06/29/14 0001    ADLs   Writing Reviewed handwriting strategies with pt/ wife, and pt practiced writing with delliberate effort, and triangle pen with good size and legibility after min v.c. Pt filled out a check with good size and legibilty   Driving Pt/ wife were cautioned against pt  riding his motorcycle due to risk for injury.           PWR [facility]) - 06/29/14 1657    PWR! exercises Moves in sitting;Moves in standing   PWR Step 10   PWR! Up 10   PWR! Rock 10   PWR! Step 10               OT Short Term Goals - 06/12/14 1216    OT SHORT TERM GOAL #1   Title Pt will be independent with PD-specific HEP for coordination, bradykinesia, rigidity.--due 06/06/14   Time 4   Period Weeks   Status Achieved  06/12/14   OT SHORT TERM GOAL #2   Title Pt will verbalize understanding of ways to prevent future complications related to PD and appropriate community resources.--due 06/06/14   Time 4   Period Weeks   Status Achieved  06/12/14   OT SHORT TERM GOAL #3   Title Pt will consistently be  able to write 2-3sentence paragraph with  100% legibility in only min increased time/decrease in size.--due 06/06/14   Baseline pt reports inconsistent size affecting legibility and demo signifcantly increased time needed to write (limits ability to perform financial managemnt)   Time 4   Period Weeks   Status On-going  min-mod increased time, 100% legibility and no decrease in size   OT SHORT TERM GOAL #4   Title Pt will demo increased ease with dressing as shown by completing PPT#4 in 11sec or less--due 06/06/14   Baseline 14.68   Time 4   Period Weeks   Status Achieved  10.66sec with strategy after instruction           OT Long Term Goals - 06/29/14 1655    OT LONG TERM GOAL #1   Title Pt will verbalize understanding of strategies/AE to increase ease with ADLs/IADLs prn.--due 07/06/14   Baseline Pt educated in strategies for dressing, handwriting, use of big movments   Time 8   Period Weeks   Status Achieved   OT LONG TERM GOAL #2   Title Pt will improve coordination for ADLs as shown by improving time on 9-hole peg test by at least 8sec with RUE.--due 07/06/14   Baseline 37.50   Time 8   Period Weeks   Status New   OT LONG TERM GOAL #3   Title Pt will improve coordination for ADLs as shown by improving time on 9-hole peg test by at least 8sec with LUE.--due 07/06/14   Baseline 39.12   Time 8   Period Weeks   Status New   OT LONG TERM GOAL #4   Title Pt will improve functional raching/coordination as shown by improving score by at least 6 on box and blocks test with LUE.----due 07/06/14   Baseline 39   Time 8   Period Weeks   Status New   OT LONG TERM GOAL #5   Title Pt will report increase ease with performing simple car maintenance/yardwork tasks mod I.--due 07/06/14   Time 8   Period Weeks   Status New               Plan - 06/29/14 1649    Clinical Impression Statement Pt is progressing towards goals. Plan to check progress towards remaining goals next visit and  discharge.   Pt will benefit from skilled therapeutic intervention in order to improve on the following deficits (Retired) Decreased coordination;Decreased activity tolerance;Impaired tone;Impaired UE functional use;Decreased knowledge of use of DME;Decreased balance;Decreased  mobility   Rehab Potential Good   OT Frequency 2x / week   OT Duration 8 weeks   OT Treatment/Interventions Self-care/ADL training;Moist Heat;Patient/family education;DME and/or AE instruction;Therapeutic exercises;Therapeutic activities;Neuromuscular education;Functional Mobility Training;Passive range of motion;Energy conservation;Manual Therapy;Cognitive remediation/compensation;Cryotherapy   Plan check progress towards goals and d/c   OT Home Exercise Plan has been issued:  PWR! hands, coordination HEP (3/31) big movments with ADLS, modified quadraped PWR! (06/08/14)bag exercises (06/20/14)   Consulted and Agree with Plan of Care Patient        Problem List Patient Active Problem List   Diagnosis Date Noted  . Depression 07/26/2013  . Unsteady gait 07/26/2013  . Orthostatic hypotension 07/26/2013  . Anxiety 06/23/2013  . Chest pain 06/23/2013  . Tremor 05/18/2013  . Occlusion and stenosis of carotid artery without mention of cerebral infarction 08/30/2012    Chaniya Genter 06/29/2014, 5:01 PM Theone Murdoch, OTR/L [phone] Phone: [phone] 5:02 PM 06/29/2014 [facility] [address] [place], [place], [address] Phone: [phone]   Fax:  [phone]

## 2014-07-03 ENCOUNTER — Ambulatory Visit: Payer: Medicare PPO | Admitting: Occupational Therapy

## 2014-07-04 ENCOUNTER — Encounter: Payer: Medicare PPO | Admitting: Occupational Therapy

## 2014-07-09 DIAGNOSIS — H2513 Age-related nuclear cataract, bilateral: Secondary | ICD-10-CM | POA: Diagnosis not present

## 2014-07-10 ENCOUNTER — Ambulatory Visit: Payer: Medicare PPO | Attending: Neurology | Admitting: Occupational Therapy

## 2014-07-10 ENCOUNTER — Encounter: Payer: Self-pay | Admitting: Occupational Therapy

## 2014-07-10 DIAGNOSIS — R490 Dysphonia: Secondary | ICD-10-CM | POA: Insufficient documentation

## 2014-07-10 DIAGNOSIS — R1310 Dysphagia, unspecified: Secondary | ICD-10-CM | POA: Insufficient documentation

## 2014-07-10 DIAGNOSIS — R29898 Other symptoms and signs involving the musculoskeletal system: Secondary | ICD-10-CM

## 2014-07-10 DIAGNOSIS — R258 Other abnormal involuntary movements: Secondary | ICD-10-CM

## 2014-07-10 DIAGNOSIS — Z7409 Other reduced mobility: Secondary | ICD-10-CM

## 2014-07-10 DIAGNOSIS — R278 Other lack of coordination: Secondary | ICD-10-CM

## 2014-07-10 DIAGNOSIS — R279 Unspecified lack of coordination: Secondary | ICD-10-CM

## 2014-07-10 NOTE — Therapy (Signed)
[facility] [address] [place], [place], [address] Phone: [phone]   Fax:  [phone]  Occupational Therapy Treatment  Patient Details  Name: Joseph Arroyo MRN: [id number] Date of Birth: [birth year] Referring Provider:  Ashok Norris, MD  Encounter Date: 07/10/2014      OT End of Session - 07/10/14 1312    Visit Number 11   Number of Visits 17   Date for OT Re-Evaluation 07/06/14   Authorization Time Period [employer] approved an additional 4 visits 4/12-5/27/16 G code next visit   Authorization - Visit Number 10   Authorization - Number of Visits 10   OT Start Time 1150   OT Stop Time 1240   OT Time Calculation (min) 50 min   Activity Tolerance Patient tolerated treatment well   Behavior During Therapy [facility] for tasks assessed/performed      Past Medical History  Diagnosis Date  . Ulcer   . Depression   . Occlusion and stenosis of carotid artery without mention of cerebral infarction   . Lumbago   . Thrombocytopenia, unspecified   . Other malaise and fatigue 11/21/2007  . Allergic rhinitis due to pollen 11/21/2007  . Contusion of toe 09/12/2008  . Anxiety state, unspecified 11/28/2008  . Brachial neuritis or radiculitis NOS   . Cramp of limb   . Abnormal involuntary movements(781.0)   . Acute upper respiratory infections of unspecified site   . Cervicalgia   . Conjunctivitis unspecified   . Sprain of hand, unspecified site   . Enthesopathy of unspecified site   . Pain in limb   . Essential and other specified forms of tremor   . Hip, thigh, leg, and ankle, insect bite, nonvenomous, without mention of infection(916.4)   . Chest pain, unspecified   . Sprain of neck   . Dysfunction of eustachian tube   . Costal chondritis   . Sleep disturbance, unspecified   . Contact dermatitis and other eczema due to plants (except food)   . Acute sinusitis, unspecified     Past Surgical History   Procedure Laterality Date  . Other surgical history  2002    ear surgery  . Colonoscopy  2008  . Stapedes surgery Right 2002    There were no vitals filed for this visit.  Visit Diagnosis:  Bradykinesia  Rigidity  Decreased coordination  Decreased functional mobility and endurance      Subjective Assessment - 07/10/14 1154    Subjective  "difficulty getting started walking"   Patient is accompained by: Family member   Currently in Pain? Yes   Pain Score 4    Pain Location Back   Pain Orientation Lower   Pain Descriptors / Indicators Aching   Pain Type Chronic pain   Aggravating Factors  standing   Pain Relieving Factors stretching                      OT Treatments/Exercises (OP) - 07/10/14 0001    ADLs   Functional Mobility Pt reports difficulty initiating walking.  Instructed pt in PWR! up with PWR! step to initiate.  Pt verbalized understanding.   Home Maintenance Instructed pt in strategy for weeding.  Pt perfomed simulated using strategy with good balance after instruction   Writing Writing with foam grip with good legibility and size, but needed min-mod incr time.  Reviewed use of PWR! hands to    ADL Comments checked goals and discussed progress (see goals section), recommended  follow-up therapy screens in approx 6-7 months (instructed pt/wife in purpose/process) and they agreed.  Pt instructed to contact MD if he has functional changes before screens.  Reviewed decr awareness/bradykinesia in PD and use of strategies/big amplitude movements to prevent future complications.  Reviewed/re-issued community resources for PD.  Instructed pt/wife in diagnosis process of PD.  Pt/wife verbalized understanding.                  OT Short Term Goals - 07/10/14 1201    OT SHORT TERM GOAL #1   Title Pt will be independent with PD-specific HEP for coordination, bradykinesia, rigidity.--due 06/06/14   Time 4   Period Weeks   Status Achieved  06/12/14    OT SHORT TERM GOAL #2   Title Pt will verbalize understanding of ways to prevent future complications related to PD and appropriate community resources.--due 06/06/14   Time 4   Period Weeks   Status Achieved  06/12/14   OT SHORT TERM GOAL #3   Title Pt will consistently be able to write 2-3sentence paragraph with  100% legibility in only min increased time/decrease in size.--due 06/06/14   Baseline pt reports inconsistent size affecting legibility and demo signifcantly increased time needed to write (limits ability to perform financial managemnt)   Time 4   Period Weeks   Status Partially Met  min-mod increased time, 100% legibility and no decrease in size.  07/10/14:  100% legibility, no decr size,  but needed min-mod incr time   OT SHORT TERM GOAL #4   Title Pt will demo increased ease with dressing as shown by completing PPT#4 in 11sec or less--due 06/06/14   Baseline 14.68   Time 4   Period Weeks   Status Achieved  10.66sec with strategy after instruction           OT Long Term Goals - 07/10/14 1159    OT LONG TERM GOAL #1   Title Pt will verbalize understanding of strategies/AE to increase ease with ADLs/IADLs prn.--due 07/06/14   Baseline Pt educated in strategies for dressing, handwriting, use of big movments   Time 8   Period Weeks   Status Achieved   OT LONG TERM GOAL #2   Title Pt will improve coordination for ADLs as shown by improving time on 9-hole peg test by at least 8sec with RUE.--due 07/06/14   Baseline 37.50   Time 8   Period Weeks   Status Achieved  07/10/14:  24.88sec   OT LONG TERM GOAL #3   Title Pt will improve coordination for ADLs as shown by improving time on 9-hole peg test by at least 8sec with LUE.--due 07/06/14   Baseline 39.12   Time 8   Period Weeks   Status Achieved  07/10/14:  28.94sec   OT LONG TERM GOAL #4   Title Pt will improve functional raching/coordination as shown by improving score by at least 6 on box and blocks test with LUE.----due  07/06/14   Baseline 39   Time 8   Period Weeks   Status Not Met  07/10/14:  R-50 blocks, L-43 blocks   OT LONG TERM GOAL #5   Title Pt will report increase ease with performing simple car maintenance/yardwork tasks mod I.--due 07/06/14   Time 8   Period Weeks   Status Achieved  07/10/14         OCCUPATIONAL THERAPY DISCHARGE SUMMARY   Remaining deficits: Tremors, bradykinesia, rigidity, decreased coordination, decreased posture/functional mobility   Education /  Equipment: Pt instructed in the following:  PD education, PD-specific HEP, strategies for ADLs/IADLs, appropriate community resources.  Pt/family verbalized understanding of all education provided.  Plan: Patient agrees to discharge.  Patient goals were partially met. Patient is being discharged due to                                                     Reaching maximal rehab potential at this time.  3/4 STGs met and 4/5 LTGs met.?????            Plan - 08/04/14 1223    Clinical Impression Statement Pt made good progress with 4/5 LTGs met.    Plan d/c, schedule therapy screens in approx 6-57month    Consulted and Agree with Plan of Care Patient;Family member/caregiver   Family Member Consulted wife          G-Codes - 006/04/[id number]    Functional Assessment Tool Used 9-hole peg test:  R-24.88, L-28.94, Box and blocks test:  L-43blocks   Functional Limitation Carrying, moving and handling objects   Carrying, Moving and Handling Objects Goal Status [phone] At least 1 percent but less than 20 percent impaired, limited or restricted   Carrying, Moving and Handling Objects Discharge Status [phone] At least 1 percent but less than 20 percent impaired, limited or restricted      Problem List Patient Active Problem List   Diagnosis Date Noted  . Depression 07/26/2013  . Unsteady gait 07/26/2013  . Orthostatic hypotension 07/26/2013  . Anxiety 06/23/2013  . Chest pain 06/23/2013  . Tremor 05/18/2013  . Occlusion and  stenosis of carotid artery without mention of cerebral infarction 08/30/2012    [facility]5June 04, 2016 5:41 PM  [place] [address]Phone: [phone]  Fax:  3[place] OTR/L 006/04/20165:41 PM

## 2014-07-13 DIAGNOSIS — H5213 Myopia, bilateral: Secondary | ICD-10-CM | POA: Diagnosis not present

## 2014-07-13 DIAGNOSIS — H2513 Age-related nuclear cataract, bilateral: Secondary | ICD-10-CM | POA: Diagnosis not present

## 2014-07-16 ENCOUNTER — Encounter: Payer: Self-pay | Admitting: *Deleted

## 2014-07-16 NOTE — Discharge Instructions (Signed)
Follow-Up Appointment is: Thursday, May 19 @ 10:55 am  Cataract Surgery Care After Refer to this sheet in the next few weeks. These instructions provide you with information on caring for yourself after your procedure. Your caregiver may also give you more specific instructions. Your treatment has been planned according to current medical practices, but problems sometimes occur. Call your caregiver if you have any problems or questions after your procedure.  HOME CARE INSTRUCTIONS   Avoid strenuous activities as directed by your caregiver.  Ask your caregiver when you can resume driving.  Use eyedrops or other medicines to help healing and control pressure inside your eye as directed by your caregiver.  Only take over-the-counter or prescription medicines for pain, discomfort, or fever as directed by your caregiver.  Do not to touch or rub your eyes.  You may be instructed to use a protective shield during the first few days and nights after surgery. If not, wear sunglasses to protect your eyes. This is to protect the eye from pressure or from being accidentally bumped.  Keep the area around your eye clean and dry. Avoid swimming or allowing water to hit you directly in the face while showering. Keep soap and shampoo out of your eyes.  Do not bend or lift heavy objects. Bending increases pressure in the eye. You can walk, climb stairs, and do light household chores.  Do not put a contact lens into the eye that had surgery until your caregiver says it is okay to do so.  Ask your doctor when you can return to work. This will depend on the kind of work that you do. If you work in a dusty environment, you may be advised to wear protective eyewear for a period of time.  Ask your caregiver when it will be safe to engage in sexual activity.  Continue with your regular eye exams as directed by your caregiver. What to expect:  It is normal to feel itching and mild discomfort for a few days  after cataract surgery. Some fluid discharge is also common, and your eye may be sensitive to light and touch.  After 1 to 2 days, even moderate discomfort should disappear. In most cases, healing will take about 6 weeks.  If you received an intraocular lens (IOL), you may notice that colors are very bright or have a blue tinge. Also, if you have been in bright sunlight, everything may appear reddish for a few hours. If you see these color tinges, it is because your lens is clear and no longer cloudy. Within a few months after receiving an IOL, these extra colors should go away. When you have healed, you will probably need new glasses. SEEK MEDICAL CARE IF:   You have increased bruising around your eye.  You have discomfort not helped by medicine. SEEK IMMEDIATE MEDICAL CARE IF:   You have a fever.  You have a worsening or sudden vision loss.  You have redness, swelling, or increasing pain in the eye.  You have a thick discharge from the eye that had surgery. MAKE SURE YOU:  Understand these instructions.  Will watch your condition.  Will get help right away if you are not doing well or get worse. Document Released: 09/05/2004 Document Revised: 05/11/2011 Document Reviewed: 10/10/2010 [employer] Patient Information 2015 [place], [place]. This information is not intended to replace advice given to you by your health care provider. Make sure you discuss any questions you have with your health care provider.   General  Anesthesia, Care After Refer to this sheet in the next few weeks. These instructions provide you with information on caring for yourself after your procedure. Your health care provider may also give you more specific instructions. Your treatment has been planned according to current medical practices, but problems sometimes occur. Call your health care provider if you have any problems or questions after your procedure. WHAT TO EXPECT AFTER THE PROCEDURE After the procedure,  it is typical to experience:  Sleepiness.  Nausea and vomiting. HOME CARE INSTRUCTIONS  For the first 24 hours after general anesthesia:  Have a responsible person with you.  Do not drive a car. If you are alone, do not take public transportation.  Do not drink alcohol.  Do not take medicine that has not been prescribed by your health care provider.  Do not sign important papers or make important decisions.  You may resume a normal diet and activities as directed by your health care provider.  Change bandages (dressings) as directed.  If you have questions or problems that seem related to general anesthesia, call the hospital and ask for the anesthetist or anesthesiologist on call. SEEK MEDICAL CARE IF:  You have nausea and vomiting that continue the day after anesthesia.  You develop a rash. SEEK IMMEDIATE MEDICAL CARE IF:   You have difficulty breathing.  You have chest pain.  You have any allergic problems. Document Released: 05/25/2000 Document Revised: 02/21/2013 Document Reviewed: 09/01/2012 [facility] Patient Information 2015 [place], [place]. This information is not intended to replace advice given to you by your health care provider. Make sure you discuss any questions you have with your health care provider.

## 2014-07-18 ENCOUNTER — Encounter: Payer: Self-pay | Admitting: *Deleted

## 2014-07-18 ENCOUNTER — Encounter
Admission: RE | Disposition: A | Discharge status: Home / Self Care | Payer: Self-pay | Source: Ambulatory Visit | Attending: Ophthalmology

## 2014-07-18 ENCOUNTER — Ambulatory Visit: Payer: Medicare PPO | Admitting: Student in an Organized Health Care Education/Training Program

## 2014-07-18 ENCOUNTER — Ambulatory Visit
Admission: RE | Admit: 2014-07-18 | Discharge: 2014-07-18 | Disposition: A | Discharge status: Home / Self Care | Payer: Medicare PPO | Source: Ambulatory Visit | Attending: Ophthalmology | Admitting: Ophthalmology

## 2014-07-18 DIAGNOSIS — H2511 Age-related nuclear cataract, right eye: Secondary | ICD-10-CM | POA: Insufficient documentation

## 2014-07-18 DIAGNOSIS — H269 Unspecified cataract: Secondary | ICD-10-CM | POA: Diagnosis present

## 2014-07-18 DIAGNOSIS — Z9889 Other specified postprocedural states: Secondary | ICD-10-CM | POA: Diagnosis not present

## 2014-07-18 DIAGNOSIS — Z87442 Personal history of urinary calculi: Secondary | ICD-10-CM | POA: Diagnosis not present

## 2014-07-18 DIAGNOSIS — Z888 Allergy status to other drugs, medicaments and biological substances status: Secondary | ICD-10-CM | POA: Insufficient documentation

## 2014-07-18 DIAGNOSIS — G20 Parkinson's disease: Secondary | ICD-10-CM | POA: Insufficient documentation

## 2014-07-18 DIAGNOSIS — H2513 Age-related nuclear cataract, bilateral: Secondary | ICD-10-CM | POA: Diagnosis not present

## 2014-07-18 HISTORY — DX: Unspecified convulsions: R56.9

## 2014-07-18 HISTORY — DX: Concussion with loss of consciousness status unknown, initial encounter: S06.0XAA

## 2014-07-18 HISTORY — PX: CATARACT EXTRACTION: SUR2

## 2014-07-18 HISTORY — DX: Gastro-esophageal reflux disease without esophagitis: K21.9

## 2014-07-18 HISTORY — DX: Concussion with loss of consciousness of unspecified duration, initial encounter: S06.0X9A

## 2014-07-18 SURGERY — PHACOEMULSIFICATION, CATARACT, WITH IOL INSERTION
Anesthesia: Monitor Anesthesia Care | Laterality: Right | Wound class: Clean

## 2014-07-18 MED ORDER — CEFUROXIME OPHTHALMIC INJECTION 1 MG/0.1 ML
INJECTION | OPHTHALMIC | Status: DC | PRN
  Administered 2014-07-18: .3 mL via INTRACAMERAL

## 2014-07-18 MED ORDER — TETRACAINE HCL 0.5 % OP SOLN
1.0000 [drp] | Freq: Once | OPHTHALMIC | Status: AC
  Administered 2014-07-18: 1 [drp] via OPHTHALMIC

## 2014-07-18 MED ORDER — ACETAMINOPHEN 325 MG PO TABS: 325.0000 mg | ORAL_TABLET | ORAL | Status: DC | PRN

## 2014-07-18 MED ORDER — LIDOCAINE HCL (PF) 4 % IJ SOLN
INTRAMUSCULAR | Status: DC | PRN
  Administered 2014-07-18: 1 mL

## 2014-07-18 MED ORDER — LIDOCAINE HCL (PF) 2 % IJ SOLN: INTRAMUSCULAR | Status: DC | PRN

## 2014-07-18 MED ORDER — NA HYALUR & NA CHOND-NA HYALUR 0.55-0.5 ML IO KIT
PACK | INTRAOCULAR | Status: DC | PRN
  Administered 2014-07-18: 1 mL via OPHTHALMIC

## 2014-07-18 MED ORDER — ARMC OPHTHALMIC DILATING GEL
1.0000 "application " | OPHTHALMIC | Status: DC | PRN
  Administered 2014-07-18 (×2): 1 via OPHTHALMIC

## 2014-07-18 MED ORDER — MIDAZOLAM HCL 2 MG/2ML IJ SOLN
INTRAMUSCULAR | Status: DC | PRN
  Administered 2014-07-18 (×2): 1 mg via INTRAVENOUS

## 2014-07-18 MED ORDER — EPINEPHRINE HCL 1 MG/ML IJ SOLN
INTRAMUSCULAR | Status: DC | PRN
  Administered 2014-07-18: 1 mg

## 2014-07-18 MED ORDER — ALFENTANIL 500 MCG/ML IJ INJ
INJECTION | INTRAMUSCULAR | Status: DC | PRN
  Administered 2014-07-18: 300 ug via INTRAVENOUS

## 2014-07-18 MED ORDER — BRIMONIDINE TARTRATE 0.2 % OP SOLN
OPHTHALMIC | Status: DC | PRN
  Administered 2014-07-18: 1 [drp] via OPHTHALMIC

## 2014-07-18 MED ORDER — BSS IO SOLN: INTRAOCULAR | Status: DC | PRN

## 2014-07-18 MED ORDER — TIMOLOL MALEATE 0.5 % OP SOLN
OPHTHALMIC | Status: DC | PRN
  Administered 2014-07-18: 1 [drp] via OPHTHALMIC

## 2014-07-18 MED ORDER — BSS IO SOLN
INTRAOCULAR | Status: DC | PRN
  Administered 2014-07-18: 3 mL via INTRAOCULAR
  Administered 2014-07-18: 15 mL via INTRAOCULAR
  Administered 2014-07-18: 83 mL via INTRAOCULAR

## 2014-07-18 MED ORDER — ACETAMINOPHEN 160 MG/5ML PO SOLN: 325.0000 mg | ORAL | Status: DC | PRN

## 2014-07-18 MED ORDER — POVIDONE-IODINE 5 % OP SOLN
1.0000 | Freq: Once | OPHTHALMIC | Status: AC
Start: 2014-07-18 — End: 2014-07-18
  Administered 2014-07-18: 1 via OPHTHALMIC

## 2014-07-18 NOTE — Op Note (Signed)
LOCATION:  [facility]   PREOPERATIVE DIAGNOSIS:    Nuclear sclerotic cataract right eye. H25.11   POSTOPERATIVE DIAGNOSIS:  Nuclear sclerotic cataract right eye.     PROCEDURE:  Phacoemusification with posterior chamber intraocular lens placement of the right eye   LENS:   Implant Name Type Inv. Item Serial No. Manufacturer Lot No. LRB No. Used  [id number] Tecnis IOL ABBOTT Intraocular Lens   [id number]     Right 1     14.5 diopter PCIOL   ULTRASOUND TIME: 14 % of 1 minutes, 24 seconds.  CDE 12.0   SURGEON:  Wyonia Hough, MD   ANESTHESIA:  Topical with tetracaine drops and 2% Xylocaine jelly, augmented with 1% preservative-free intracameral lidocaine.    COMPLICATIONS:  None.   DESCRIPTION OF PROCEDURE:  The patient was identified in the holding room and transported to the operating room and placed in the supine position under the operating microscope.  The right eye was identified as the operative eye and it was prepped and draped in the usual sterile ophthalmic fashion.   A 1 millimeter clear-corneal paracentesis was made at the 12:00 position.  0.5 ml of preservative-free 1% lidocaine was injected into the anterior chamber. The anterior chamber was filled with Viscoat viscoelastic.  A 2.4 millimeter keratome was used to make a near-clear corneal incision at the 9:00 position.  A curvilinear capsulorrhexis was made with a cystotome and capsulorrhexis forceps.  Balanced salt solution was used to hydrodissect and hydrodelineate the nucleus.   Phacoemulsification was then used in stop and chop fashion to remove the lens nucleus and epinucleus.  The remaining cortex was then removed using the irrigation and aspiration handpiece. Provisc was then placed into the capsular bag to distend it for lens placement.  A lens was then injected into the capsular bag.  The remaining viscoelastic was aspirated.   Wounds were hydrated with balanced salt solution.  The anterior chamber was  inflated to a physiologic pressure with balanced salt solution.  No wound leaks were noted. Cefuroxime 0.1 ml of a 10mg /ml solution was injected into the anterior chamber for a dose of 1 mg of intracameral antibiotic at the completion of the case.   Timolol and Brimonidine drops were applied to the eye.  The patient was taken to the recovery room in stable condition without complications of anesthesia or surgery.   Kearstyn Avitia 07/18/2014, 11:33 AM

## 2014-07-18 NOTE — H&P (Signed)
  The History and Physical notes were scanned in.  The patient remains stable and unchanged from the H&P.   Previous H&P reviewed, patient examined, and there are no changes.  Joseph Arroyo 07/18/2014 10:36 AM

## 2014-07-18 NOTE — Anesthesia Preprocedure Evaluation (Signed)
Anesthesia Evaluation  Patient identified by MRN, date of birth, ID band  Reviewed: Allergy & Precautions, H&P , NPO status , Patient's Chart, lab work & pertinent test results  Airway Mallampati: II  TM Distance: >3 FB Neck ROM: full    Dental no notable dental hx.    Pulmonary    Pulmonary exam normal       Cardiovascular + Peripheral Vascular Disease Rhythm:regular Rate:Normal     Neuro/Psych  Neuromuscular disease    GI/Hepatic GERD-  ,  Endo/Other    Renal/GU      Musculoskeletal   Abdominal   Peds  Hematology   Anesthesia Other Findings   Reproductive/Obstetrics                             Anesthesia Physical Anesthesia Plan  ASA: II  Anesthesia Plan: MAC   Post-op Pain Management:    Induction:   Airway Management Planned:   Additional Equipment:   Intra-op Plan:   Post-operative Plan:   Informed Consent: I have reviewed the patients History and Physical, chart, labs and discussed the procedure including the risks, benefits and alternatives for the proposed anesthesia with the patient or authorized representative who has indicated his/her understanding and acceptance.     Plan Discussed with: CRNA  Anesthesia Plan Comments:         Anesthesia Quick Evaluation

## 2014-07-18 NOTE — Transfer of Care (Signed)
Immediate Anesthesia Transfer of Care Note  Patient: Joseph Arroyo  Procedure(s) Performed: Procedure(s): CATARACT EXTRACTION PHACO AND INTRAOCULAR LENS PLACEMENT (IOC) (Right)  Patient Location: PACU  Anesthesia Type: MAC  Level of Consciousness: awake, alert  and patient cooperative  Airway and Oxygen Therapy: Patient Spontanous Breathing and Patient connected to supplemental oxygen  Post-op Assessment: Post-op Vital signs reviewed, Patient's Cardiovascular Status Stable, Respiratory Function Stable, Patent Airway and No signs of Nausea or vomiting  Post-op Vital Signs: Reviewed and stable  Complications: No apparent anesthesia complications

## 2014-07-18 NOTE — Anesthesia Postprocedure Evaluation (Signed)
  Anesthesia Post-op Note  Patient: Joseph Arroyo  Procedure(s) Performed: Procedure(s): CATARACT EXTRACTION PHACO AND INTRAOCULAR LENS PLACEMENT (IOC) (Right)  Anesthesia type:MAC  Patient location: PACU  Post pain: Pain level controlled  Post assessment: Post-op Vital signs reviewed, Patient's Cardiovascular Status Stable, Respiratory Function Stable, Patent Airway and No signs of Nausea or vomiting  Post vital signs: Reviewed and stable  Last Vitals:  Filed Vitals:   07/18/14 1137  BP:   Pulse: 51  Temp: 36.7 C  Resp:     Level of consciousness: awake, alert  and patient cooperative  Complications: No apparent anesthesia complications

## 2014-07-27 ENCOUNTER — Encounter: Payer: Self-pay | Admitting: *Deleted

## 2014-07-27 DIAGNOSIS — H2512 Age-related nuclear cataract, left eye: Secondary | ICD-10-CM | POA: Diagnosis not present

## 2014-07-31 NOTE — Discharge Instructions (Signed)
Cataract Surgery °Care After °Refer to this sheet in the next few weeks. These instructions provide you with information on caring for yourself after your procedure. Your caregiver may also give you more specific instructions. Your treatment has been planned according to current medical practices, but problems sometimes occur. Call your caregiver if you have any problems or questions after your procedure.  °HOME CARE INSTRUCTIONS  °· Avoid strenuous activities as directed by your caregiver. °· Ask your caregiver when you can resume driving. °· Use eyedrops or other medicines to help healing and control pressure inside your eye as directed by your caregiver. °· Only take over-the-counter or prescription medicines for pain, discomfort, or fever as directed by your caregiver. °· Do not to touch or rub your eyes. °· You may be instructed to use a protective shield during the first few days and nights after surgery. If not, wear sunglasses to protect your eyes. This is to protect the eye from pressure or from being accidentally bumped. °· Keep the area around your eye clean and dry. Avoid swimming or allowing water to hit you directly in the face while showering. Keep soap and shampoo out of your eyes. °· Do not bend or lift heavy objects. Bending increases pressure in the eye. You can walk, climb stairs, and do light household chores. °· Do not put a contact lens into the eye that had surgery until your caregiver says it is okay to do so. °· Ask your doctor when you can return to work. This will depend on the kind of work that you do. If you work in a dusty environment, you may be advised to wear protective eyewear for a period of time. °· Ask your caregiver when it will be safe to engage in sexual activity. °· Continue with your regular eye exams as directed by your caregiver. °What to expect: °· It is normal to feel itching and mild discomfort for a few days after cataract surgery. Some fluid discharge is also common,  and your eye may be sensitive to light and touch. °· After 1 to 2 days, even moderate discomfort should disappear. In most cases, healing will take about 6 weeks. °· If you received an intraocular lens (IOL), you may notice that colors are very bright or have a blue tinge. Also, if you have been in bright sunlight, everything may appear reddish for a few hours. If you see these color tinges, it is because your lens is clear and no longer cloudy. Within a few months after receiving an IOL, these extra colors should go away. When you have healed, you will probably need new glasses. °SEEK MEDICAL CARE IF:  °· You have increased bruising around your eye. °· You have discomfort not helped by medicine. °SEEK IMMEDIATE MEDICAL CARE IF:  °· You have a  fever. °· You have a worsening or sudden vision loss. °· You have redness, swelling, or increasing pain in the eye. °· You have a thick discharge from the eye that had surgery. °MAKE SURE YOU: °· Understand these instructions. °· Will watch your condition. °· Will get help right away if you are not doing well or get worse. °Document Released: 09/05/2004 Document Revised: 05/11/2011 Document Reviewed: 10/10/2010 °[employer]® Patient Information ©2015 [employer]. This information is not intended to replace advice given to you by your health care provider. Make sure you discuss any questions you have with your health care provider. °General Anesthesia, Care After °Refer to this sheet in the next few weeks.   These instructions provide you with information on caring for yourself after your procedure. Your health care provider may also give you more specific instructions. Your treatment has been planned according to current medical practices, but problems sometimes occur. Call your health care provider if you have any problems or questions after your procedure. °WHAT TO EXPECT AFTER THE PROCEDURE °After the procedure, it is typical to experience: °· Sleepiness. °· Nausea and  vomiting. °HOME CARE INSTRUCTIONS °· For the first 24 hours after general anesthesia: °· Have a responsible person with you. °· Do not drive a car. If you are alone, do not take public transportation. °· Do not drink alcohol. °· Do not take medicine that has not been prescribed by your health care provider. °· Do not sign important papers or make important decisions. °· You may resume a normal diet and activities as directed by your health care provider. °· Change bandages (dressings) as directed. °· If you have questions or problems that seem related to general anesthesia, call the hospital and ask for the anesthetist or anesthesiologist on call. °SEEK MEDICAL CARE IF: °· You have nausea and vomiting that continue the day after anesthesia. °· You develop a rash. °SEEK IMMEDIATE MEDICAL CARE IF:  °· You have difficulty breathing. °· You have chest pain. °· You have any allergic problems. °Document Released: 05/25/2000 Document Revised: 02/21/2013 Document Reviewed: 09/01/2012 °[employer]® Patient Information ©2015 [employer]. This information is not intended to replace advice given to you by your health care provider. Make sure you discuss any questions you have with your health care provider. ° °

## 2014-08-01 ENCOUNTER — Ambulatory Visit
Admission: RE | Admit: 2014-08-01 | Discharge: 2014-08-01 | Disposition: A | Discharge status: Home / Self Care | Payer: Medicare PPO | Source: Ambulatory Visit | Attending: Ophthalmology | Admitting: Ophthalmology

## 2014-08-01 ENCOUNTER — Ambulatory Visit: Payer: Medicare PPO | Admitting: Anesthesiology

## 2014-08-01 ENCOUNTER — Encounter
Admission: RE | Disposition: A | Discharge status: Home / Self Care | Payer: Self-pay | Source: Ambulatory Visit | Attending: Ophthalmology

## 2014-08-01 DIAGNOSIS — I739 Peripheral vascular disease, unspecified: Secondary | ICD-10-CM | POA: Diagnosis not present

## 2014-08-01 DIAGNOSIS — K219 Gastro-esophageal reflux disease without esophagitis: Secondary | ICD-10-CM | POA: Insufficient documentation

## 2014-08-01 DIAGNOSIS — H2512 Age-related nuclear cataract, left eye: Secondary | ICD-10-CM | POA: Insufficient documentation

## 2014-08-01 DIAGNOSIS — G20 Parkinson's disease: Secondary | ICD-10-CM | POA: Diagnosis not present

## 2014-08-01 HISTORY — PX: CATARACT EXTRACTION W/PHACO: SHX586

## 2014-08-01 SURGERY — PHACOEMULSIFICATION, CATARACT, WITH IOL INSERTION
Anesthesia: Monitor Anesthesia Care | Laterality: Left | Wound class: Clean

## 2014-08-01 MED ORDER — ACETAMINOPHEN 160 MG/5ML PO SOLN: 325.0000 mg | ORAL | Status: DC | PRN

## 2014-08-01 MED ORDER — TETRACAINE HCL 0.5 % OP SOLN
1.0000 [drp] | OPHTHALMIC | Status: DC | PRN
  Administered 2014-08-01: 1 [drp] via OPHTHALMIC

## 2014-08-01 MED ORDER — FENTANYL CITRATE (PF) 100 MCG/2ML IJ SOLN
INTRAMUSCULAR | Status: DC | PRN
  Administered 2014-08-01 (×2): 25 ug via INTRAVENOUS
  Administered 2014-08-01: 50 ug via INTRAVENOUS

## 2014-08-01 MED ORDER — BRIMONIDINE TARTRATE 0.2 % OP SOLN
OPHTHALMIC | Status: DC | PRN
  Administered 2014-08-01: 1 [drp] via OPHTHALMIC

## 2014-08-01 MED ORDER — CEFUROXIME OPHTHALMIC INJECTION 1 MG/0.1 ML
INJECTION | OPHTHALMIC | Status: DC | PRN
  Administered 2014-08-01: 0.1 mL via INTRACAMERAL

## 2014-08-01 MED ORDER — NA HYALUR & NA CHOND-NA HYALUR 0.4-0.35 ML IO KIT
PACK | INTRAOCULAR | Status: DC | PRN
  Administered 2014-08-01: 1 mL via INTRAOCULAR

## 2014-08-01 MED ORDER — MIDAZOLAM HCL 2 MG/2ML IJ SOLN
INTRAMUSCULAR | Status: DC | PRN
  Administered 2014-08-01 (×2): 1 mg via INTRAVENOUS

## 2014-08-01 MED ORDER — TIMOLOL MALEATE 0.5 % OP SOLN
OPHTHALMIC | Status: DC | PRN
  Administered 2014-08-01: 1 [drp] via OPHTHALMIC

## 2014-08-01 MED ORDER — ACETAMINOPHEN 325 MG PO TABS: 325.0000 mg | ORAL_TABLET | ORAL | Status: DC | PRN

## 2014-08-01 MED ORDER — POVIDONE-IODINE 5 % OP SOLN
1.0000 "application " | OPHTHALMIC | Status: DC | PRN
  Administered 2014-08-01: 1 via OPHTHALMIC

## 2014-08-01 MED ORDER — EPINEPHRINE HCL 1 MG/ML IJ SOLN
INTRAMUSCULAR | Status: DC | PRN
  Administered 2014-08-01: 1 mg

## 2014-08-01 MED ORDER — ARMC OPHTHALMIC DILATING GEL
1.0000 "application " | OPHTHALMIC | Status: DC | PRN
  Administered 2014-08-01 (×2): 1 via OPHTHALMIC

## 2014-08-01 SURGICAL SUPPLY — 25 items
CANNULA ANT/CHMB 27GA (MISCELLANEOUS) ×2 IMPLANT
GLOVE SURG LX 7.5 STRW (GLOVE) ×1
GLOVE SURG LX STRL 7.5 STRW (GLOVE) ×1 IMPLANT
GLOVE SURG TRIUMPH 8.0 PF LTX (GLOVE) ×2 IMPLANT
GOWN STRL REUS W/ TWL LRG LVL3 (GOWN DISPOSABLE) ×2 IMPLANT
GOWN STRL REUS W/TWL LRG LVL3 (GOWN DISPOSABLE) ×2
LENS IOL TECNIS 12.5 (Intraocular Lens) ×2 IMPLANT
MARKER SKIN SURG W/RULER VIO (MISCELLANEOUS) ×2 IMPLANT
NDL RETROBULBAR .5 NSTRL (NEEDLE) IMPLANT
NEEDLE FILTER BLUNT 18X 1/2SAF (NEEDLE) ×1
NEEDLE FILTER BLUNT 18X1 1/2 (NEEDLE) ×1 IMPLANT
PACK CATARACT BRASINGTON (MISCELLANEOUS) ×2 IMPLANT
PACK EYE AFTER SURG (MISCELLANEOUS) ×2 IMPLANT
PACK OPTHALMIC (MISCELLANEOUS) ×2 IMPLANT
RING MALYGIN 7.0 (MISCELLANEOUS) IMPLANT
SUT ETHILON 10-0 CS-B-6CS-B-6 (SUTURE)
SUT VICRYL  9 0 (SUTURE)
SUT VICRYL 9 0 (SUTURE) IMPLANT
SUTURE EHLN 10-0 CS-B-6CS-B-6 (SUTURE) IMPLANT
SYR 3ML LL SCALE MARK (SYRINGE) ×2 IMPLANT
SYR 5ML LL (SYRINGE) IMPLANT
SYR TB 1ML LUER SLIP (SYRINGE) ×2 IMPLANT
WATER STERILE IRR 250ML POUR (IV SOLUTION) ×2 IMPLANT
WATER STERILE IRR 500ML POUR (IV SOLUTION) IMPLANT
WIPE NON LINTING 3.25X3.25 (MISCELLANEOUS) ×2 IMPLANT

## 2014-08-01 NOTE — Transfer of Care (Signed)
Immediate Anesthesia Transfer of Care Note  Patient: Joseph Arroyo  Procedure(s) Performed: Procedure(s) with comments: CATARACT EXTRACTION PHACO AND INTRAOCULAR LENS PLACEMENT (IOC) (Left) - IVA TOPICAL  Patient Location: PACU  Anesthesia Type: MAC  Level of Consciousness: awake, alert  and patient cooperative  Airway and Oxygen Therapy: Patient Spontanous Breathing and Patient connected to supplemental oxygen  Post-op Assessment: Post-op Vital signs reviewed, Patient's Cardiovascular Status Stable, Respiratory Function Stable, Patent Airway and No signs of Nausea or vomiting  Post-op Vital Signs: Reviewed and stable  Complications: No apparent anesthesia complications

## 2014-08-01 NOTE — H&P (Signed)
  The History and Physical notes were scanned in.  The patient remains stable and unchanged from the H&P.   Previous H&P reviewed, patient examined, and there are no changes.  Joseph Arroyo 08/01/2014 12:13 PM

## 2014-08-01 NOTE — Anesthesia Preprocedure Evaluation (Signed)
Anesthesia Evaluation  Patient identified by MRN, date of birth, ID band  Reviewed: Allergy & Precautions, H&P , NPO status , Patient's Chart, lab work & pertinent test results  Airway Mallampati: II  TM Distance: >3 FB Neck ROM: full    Dental no notable dental hx.    Pulmonary    Pulmonary exam normal       Cardiovascular + Peripheral Vascular Disease Rhythm:regular Rate:Normal     Neuro/Psych Seizures -,  PSYCHIATRIC DISORDERS    GI/Hepatic GERD-  ,  Endo/Other    Renal/GU      Musculoskeletal   Abdominal   Peds  Hematology   Anesthesia Other Findings   Reproductive/Obstetrics                             Anesthesia Physical Anesthesia Plan  ASA: II  Anesthesia Plan: MAC   Post-op Pain Management:    Induction:   Airway Management Planned:   Additional Equipment:   Intra-op Plan:   Post-operative Plan:   Informed Consent: I have reviewed the patients History and Physical, chart, labs and discussed the procedure including the risks, benefits and alternatives for the proposed anesthesia with the patient or authorized representative who has indicated his/her understanding and acceptance.     Plan Discussed with: CRNA  Anesthesia Plan Comments:         Anesthesia Quick Evaluation

## 2014-08-01 NOTE — Anesthesia Postprocedure Evaluation (Signed)
  Anesthesia Post-op Note  Patient: Joseph Arroyo  Procedure(s) Performed: Procedure(s) with comments: CATARACT EXTRACTION PHACO AND INTRAOCULAR LENS PLACEMENT (IOC) (Left) - IVA TOPICAL  Anesthesia type:MAC  Patient location: PACU  Post pain: Pain level controlled  Post assessment: Post-op Vital signs reviewed, Patient's Cardiovascular Status Stable, Respiratory Function Stable, Patent Airway and No signs of Nausea or vomiting  Post vital signs: Reviewed and stable  Last Vitals:  Filed Vitals:   08/01/14 1317  BP: 116/70  Pulse: 54  Temp:   Resp: 10    Level of consciousness: awake, alert  and patient cooperative  Complications: No apparent anesthesia complications

## 2014-08-01 NOTE — Op Note (Signed)
OPERATIVE NOTE  Joseph Arroyo [id number] 08/01/2014   PREOPERATIVE DIAGNOSIS:  Nuclear sclerotic cataract left eye. H25.12   POSTOPERATIVE DIAGNOSIS:    Nuclear sclerotic cataract left eye.     PROCEDURE:  Phacoemusification with posterior chamber intraocular lens placement of the left eye   LENS:   Implant Name Type Inv. Item Serial No. Manufacturer Lot No. LRB No. Used  LENS IMPL INTRAOC [id number] 12.5 - [id number] Intraocular Lens LENS IMPL INTRAOC ZCB00 12.5 [id number] AMO   Left 1        ULTRASOUND TIME: 22 of 1 minutes 1 seconds, CDE 13.5  SURGEON:  Wyonia Hough, MD   ANESTHESIA:  Topical with tetracaine drops and 2% Xylocaine jelly.   COMPLICATIONS:  None.   DESCRIPTION OF PROCEDURE:  The patient was identified in the holding room and transported to the operating room and placed in the supine position under the operating microscope.  The left eye was identified as the operative eye and it was prepped and draped in the usual sterile ophthalmic fashion.   A 1 millimeter clear-corneal paracentesis was made at the 1:30 position.  The anterior chamber was filled with Viscoat viscoelastic.  A 2.4 millimeter keratome was used to make a near-clear corneal incision at the 10:30 position.  .  A curvilinear capsulorrhexis was made with a cystotome and capsulorrhexis forceps.  Balanced salt solution was used to hydrodissect and hydrodelineate the nucleus.   Phacoemulsification was then used in stop and chop fashion to remove the lens nucleus and epinucleus.  The remaining cortex was then removed using the irrigation and aspiration handpiece. Provisc was then placed into the capsular bag to distend it for lens placement.  A lens was then injected into the capsular bag.  The remaining viscoelastic was aspirated.   Wounds were hydrated with balanced salt solution.  The anterior chamber was inflated to a physiologic pressure with balanced salt solution.  No wound leaks were noted.  Cefuroxime 0.1 ml of a 10mg /ml solution was injected into the anterior chamber for a dose of 1 mg of intracameral antibiotic at the completion of the case.   Timolol and Brimonidine drops were applied to the eye.  The patient was taken to the recovery room in stable condition without complications of anesthesia or surgery.  [name] 08/01/2014, 1:09 PM

## 2014-08-01 NOTE — Anesthesia Procedure Notes (Signed)
Procedure Name: MAC Performed by: Lemarcus Baggerly Pre-anesthesia Checklist: Patient identified, Emergency Drugs available, Suction available, Timeout performed and Patient being monitored Patient Re-evaluated:Patient Re-evaluated prior to inductionOxygen Delivery Method: Nasal cannula Placement Confirmation: positive ETCO2     

## 2014-08-02 ENCOUNTER — Encounter: Payer: Self-pay | Admitting: Ophthalmology

## 2014-08-06 DIAGNOSIS — F332 Major depressive disorder, recurrent severe without psychotic features: Secondary | ICD-10-CM | POA: Diagnosis not present

## 2014-08-08 ENCOUNTER — Telehealth: Payer: Self-pay | Admitting: Family Medicine

## 2014-08-08 NOTE — Telephone Encounter (Signed)
Pt is requesting a return call. He scheduled his annual cpe with dr Rutherford Nail for July but need to discuss something with you

## 2014-08-08 NOTE — Telephone Encounter (Signed)
Pt just wanted to inform Dr. Rutherford Nail that a PA for his phychologist told him to make sure his PCP was aware of some chest discomfort that he had a while ago and that his gait has become unsteady. Pt has appointment for CPE in July and appt. with his neurologist in July as well. Informed pt to come in sooner if his gait became any worse.

## 2014-08-09 NOTE — Telephone Encounter (Signed)
thanks

## 2014-08-27 ENCOUNTER — Encounter: Payer: Self-pay | Admitting: General Surgery

## 2014-08-28 DIAGNOSIS — F332 Major depressive disorder, recurrent severe without psychotic features: Secondary | ICD-10-CM | POA: Diagnosis not present

## 2014-09-06 DIAGNOSIS — Z961 Presence of intraocular lens: Secondary | ICD-10-CM | POA: Diagnosis not present

## 2014-09-12 NOTE — Telephone Encounter (Signed)
error 

## 2014-09-20 ENCOUNTER — Encounter: Payer: Self-pay | Admitting: Family Medicine

## 2014-09-20 ENCOUNTER — Ambulatory Visit (INDEPENDENT_AMBULATORY_CARE_PROVIDER_SITE_OTHER): Payer: Medicare PPO | Admitting: Family Medicine

## 2014-09-20 VITALS — BP 118/64 | HR 76 | Temp 98.0°F | Resp 16 | Ht 68.0 in | Wt 144.6 lb

## 2014-09-20 DIAGNOSIS — E46 Unspecified protein-calorie malnutrition: Secondary | ICD-10-CM | POA: Diagnosis not present

## 2014-09-20 DIAGNOSIS — Z0000 Encounter for general adult medical examination without abnormal findings: Secondary | ICD-10-CM

## 2014-09-20 DIAGNOSIS — G20 Parkinson's disease: Secondary | ICD-10-CM | POA: Insufficient documentation
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
## 2014-09-20 NOTE — Patient Instructions (Addendum)
Return in one month for discussion of his anxiety fatigue tremor

## 2014-09-21 LAB — PSA: Prostate Specific Ag, Serum: 1.9 ng/mL (ref 0.0–4.0)

## 2014-09-24 DIAGNOSIS — F332 Major depressive disorder, recurrent severe without psychotic features: Secondary | ICD-10-CM | POA: Diagnosis not present

## 2014-09-25 ENCOUNTER — Encounter: Payer: Self-pay | Admitting: Neurology

## 2014-09-25 ENCOUNTER — Ambulatory Visit (INDEPENDENT_AMBULATORY_CARE_PROVIDER_SITE_OTHER): Payer: Medicare PPO | Admitting: Neurology

## 2014-09-25 ENCOUNTER — Ambulatory Visit: Payer: Medicare PPO | Admitting: General Surgery

## 2014-09-25 VITALS — BP 92/50 | HR 58 | Resp 14 | Ht 68.0 in | Wt 145.0 lb

## 2014-09-25 DIAGNOSIS — F411 Generalized anxiety disorder: Secondary | ICD-10-CM

## 2014-09-25 DIAGNOSIS — G20 Parkinson's disease: Secondary | ICD-10-CM

## 2014-09-25 MED ORDER — PRAMIPEXOLE DIHYDROCHLORIDE 0.125 MG PO TABS: ORAL_TABLET | ORAL | Status: DC
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
## 2014-09-25 NOTE — Patient Instructions (Signed)
We will increase your Mirapex to 3 pills 3 times a day.  Follow up in about 3-4 months.

## 2014-09-27 ENCOUNTER — Other Ambulatory Visit: Payer: Medicare PPO

## 2014-09-27 ENCOUNTER — Encounter: Payer: Self-pay | Admitting: General Surgery

## 2014-09-27 ENCOUNTER — Ambulatory Visit (INDEPENDENT_AMBULATORY_CARE_PROVIDER_SITE_OTHER): Payer: Medicare PPO | Admitting: General Surgery

## 2014-09-27 VITALS — BP 98/60 | HR 60 | Resp 12 | Ht 68.0 in | Wt 143.0 lb

## 2014-09-27 DIAGNOSIS — I6529 Occlusion and stenosis of unspecified carotid artery: Secondary | ICD-10-CM

## 2014-09-27 NOTE — Patient Instructions (Addendum)
The patient is aware to call back for any questions or concerns. monitor for signs and symptoms of a TIA  Transient Ischemic Attack A transient ischemic attack (TIA) is a "warning stroke" that causes stroke-like symptoms. Unlike a stroke, a TIA does not cause permanent damage to the brain. The symptoms of a TIA can happen very fast and do not last long. It is important to know the symptoms of a TIA and what to do. This can help prevent a major stroke or death. CAUSES   A TIA is caused by a temporary blockage in an artery in the brain or neck (carotid artery). The blockage does not allow the brain to get the blood supply it needs and can cause different symptoms. The blockage can be caused by either:  A blood clot.  Fatty buildup (plaque) in a neck or brain artery. RISK FACTORS  High blood pressure (hypertension).  High cholesterol.  Diabetes mellitus.  Heart disease.  The build up of plaque in the blood vessels (peripheral artery disease or atherosclerosis).  The build up of plaque in the blood vessels providing blood and oxygen to the brain (carotid artery stenosis).  An abnormal heart rhythm (atrial fibrillation).  Obesity.  Smoking.  Taking oral contraceptives (especially in combination with smoking).  Physical inactivity.  A diet high in fats, salt (sodium), and calories.  Alcohol use.  Use of illegal drugs (especially cocaine and methamphetamine).  Being male.  Being African American.  Being over the age of 9.  Family history of stroke.  Previous history of blood clots, stroke, TIA, or heart attack.  Sickle cell disease. SYMPTOMS  TIA symptoms are the same as a stroke but are temporary. These symptoms usually develop suddenly, or may be newly present upon awakening from sleep:  Sudden weakness or numbness of the face, arm, or leg, especially on one side of the body.  Sudden trouble walking or difficulty moving arms or legs.  Sudden  confusion.  Sudden personality changes.  Trouble speaking (aphasia) or understanding.  Difficulty swallowing.  Sudden trouble seeing in one or both eyes.  Double vision.  Dizziness.  Loss of balance or coordination.  Sudden severe headache with no known cause.  Trouble reading or writing.  Loss of bowel or bladder control.  Loss of consciousness. DIAGNOSIS  Your caregiver may be able to determine the presence or absence of a TIA based on your symptoms, history, and physical exam. Computed tomography (CT scan) of the brain is usually performed to help identify a TIA. Other tests may be done to diagnose a TIA. These tests may include:  Electrocardiography.  Continuous heart monitoring.  Echocardiography.  Carotid ultrasonography.  Magnetic resonance imaging (MRI).  A scan of the brain circulation.  Blood tests. PREVENTION  The risk of a TIA can be decreased by appropriately treating high blood pressure, high cholesterol, diabetes, heart disease, and obesity and by quitting smoking, limiting alcohol, and staying physically active. TREATMENT  Time is of the essence. Since the symptoms of TIA are the same as a stroke, it is important to seek treatment as soon as possible because you may need a medicine to dissolve the clot (thrombolytic) that cannot be given if too much time has passed. Treatment options vary. Treatment options may include rest, oxygen, intravenous (IV) fluids, and medicines to thin the blood (anticoagulants). Medicines and diet may be used to address diabetes, high blood pressure, and other risk factors. Measures will be taken to prevent short-term and long-term complications, including  infection from breathing foreign material into the lungs (aspiration pneumonia), blood clots in the legs, and falls. Treatment options include procedures to either remove plaque in the carotid arteries or dilate carotid arteries that have narrowed due to plaque. Those procedures  are:  Carotid endarterectomy.  Carotid angioplasty and stenting. HOME CARE INSTRUCTIONS   Take all medicines prescribed by your caregiver. Follow the directions carefully. Medicines may be used to control risk factors for a stroke. Be sure you understand all your medicine instructions.  You may be told to take aspirin or the anticoagulant warfarin. Warfarin needs to be taken exactly as instructed.  Taking too much or too little warfarin is dangerous. Too much warfarin increases the risk of bleeding. Too little warfarin continues to allow the risk for blood clots. While taking warfarin, you will need to have regular blood tests to measure your blood clotting time. A PT blood test measures how long it takes for blood to clot. Your PT is used to calculate another value called an INR. Your PT and INR help your caregiver to adjust your dose of warfarin. The dose can change for many reasons. It is critically important that you take warfarin exactly as prescribed.  Many foods, especially foods high in vitamin K can interfere with warfarin and affect the PT and INR. Foods high in vitamin K include spinach, kale, broccoli, cabbage, collard and turnip greens, brussels sprouts, peas, cauliflower, seaweed, and parsley as well as beef and pork liver, green tea, and soybean oil. You should eat a consistent amount of foods high in vitamin K. Avoid major changes in your diet, or notify your caregiver before changing your diet. Arrange a visit with a dietitian to answer your questions.  Many medicines can interfere with warfarin and affect the PT and INR. You must tell your caregiver about any and all medicines you take, this includes all vitamins and supplements. Be especially cautious with aspirin and anti-inflammatory medicines. Do not take or discontinue any prescribed or over-the-counter medicine except on the advice of your caregiver or pharmacist.  Warfarin can have side effects, such as excessive bruising or  bleeding. You will need to hold pressure over cuts for longer than usual. Your caregiver or pharmacist will discuss other potential side effects.  Avoid sports or activities that may cause injury or bleeding.  Be mindful when shaving, flossing your teeth, or handling sharp objects.  Alcohol can change the body's ability to handle warfarin. It is best to avoid alcoholic drinks or consume only very small amounts while taking warfarin. Notify your caregiver if you change your alcohol intake.  Notify your dentist or other caregivers before procedures.  Eat a diet that includes 5 or more servings of fruits and vegetables each day. This may reduce the risk of stroke. Certain diets may be prescribed to address high blood pressure, high cholesterol, diabetes, or obesity.  A low-sodium, low-saturated fat, low-trans fat, low-cholesterol diet is recommended to manage high blood pressure.  A low-saturated fat, low-trans fat, low-cholesterol, and high-fiber diet may control cholesterol levels.  A controlled-carbohydrate, controlled-sugar diet is recommended to manage diabetes.  A reduced-calorie, low-sodium, low-saturated fat, low-trans fat, low-cholesterol diet is recommended to manage obesity.  Maintain a healthy weight.  Stay physically active. It is recommended that you get at least 30 minutes of activity on most or all days.  Do not smoke.  Limit alcohol use even if you are not taking warfarin. Moderate alcohol use is considered to be:  No more than  2 drinks each day for men.  No more than 1 drink each day for nonpregnant women.  Stop drug abuse.  Home safety. A safe home environment is important to reduce the risk of falls. Your caregiver may arrange for specialists to evaluate your home. Having grab bars in the bedroom and bathroom is often important. Your caregiver may arrange for equipment to be used at home, such as raised toilets and a seat for the shower.  Follow all instructions  for follow-up with your caregiver. This is very important. This includes any referrals and lab tests. Proper follow up can prevent a stroke or another TIA from occurring. SEEK MEDICAL CARE IF:  You have personality changes.  You have difficulty swallowing.  You are seeing double.  You have dizziness.  You have a fever.  You have skin breakdown. SEEK IMMEDIATE MEDICAL CARE IF:  Any of these symptoms may represent a serious problem that is an emergency. Do not wait to see if the symptoms will go away. Get medical help right away. Call your local emergency services (911 in [place] Do not drive yourself to the hospital.  You have sudden weakness or numbness of the face, arm, or leg, especially on one side of the body.  You have sudden trouble walking or difficulty moving arms or legs.  You have sudden confusion.  You have trouble speaking (aphasia) or understanding.  You have sudden trouble seeing in one or both eyes.  You have a loss of balance or coordination.  You have a sudden, severe headache with no known cause.  You have new chest pain or an irregular heartbeat.  You have a partial or total loss of consciousness. MAKE SURE YOU:   Understand these instructions.  Will watch your condition.  Will get help right away if you are not doing well or get worse. Document Released: 11/26/2004 Document Revised: 02/21/2013 Document Reviewed: 05/24/2013 [facility] Patient Information 2015 [place], [place]. This information is not intended to replace advice given to you by your health care provider. Make sure you discuss any questions you have with your health care provider.

## 2014-09-27 NOTE — Progress Notes (Signed)
Patient ID: Joseph Arroyo, male   DOB: [birth year], 74 y.o.   MRN: [id number]  Chief Complaint  Patient presents with  . Follow-up    HPI Joseph Arroyo is a 74 y.o. male.  who presents for a follow up Carotid Ultrasound. The patient has a history of mild carotid artery plaque with no symptoms. No new problems since last visit.  He has a mild tremor and is being followed by neurology   He had cataract surgery last month and is doing well.  HPI  Past Medical History  Diagnosis Date  . Ulcer   . Depression   . Occlusion and stenosis of carotid artery without mention of cerebral infarction   . Lumbago   . Thrombocytopenia, unspecified   . Other malaise and fatigue 11/21/2007  . Allergic rhinitis due to pollen 11/21/2007  . Contusion of toe 09/12/2008  . Anxiety state, unspecified 11/28/2008  . Brachial neuritis or radiculitis NOS   . Cramp of limb   . Abnormal involuntary movements(781.0)   . Acute upper respiratory infections of unspecified site   . Cervicalgia   . Conjunctivitis unspecified   . Sprain of hand, unspecified site   . Enthesopathy of unspecified site   . Pain in limb   . Essential and other specified forms of tremor   . Hip, thigh, leg, and ankle, insect bite, nonvenomous, without mention of infection(916.4)   . Chest pain, unspecified   . Sprain of neck   . Dysfunction of eustachian tube   . Costal chondritis   . Sleep disturbance, unspecified   . Contact dermatitis and other eczema due to plants (except food)   . Acute sinusitis, unspecified   . GERD (gastroesophageal reflux disease)     in past  . Seizures     age 34 - after concussion  . Concussion     age 51 - s/p accident  . Headache     migraines - aura - no pain  . Tremors of nervous system     Parkinsonian symptoms    Past Surgical History  Procedure Laterality Date  . Other surgical history  2002    ear surgery  . Colonoscopy  2008  . Stapedes surgery Right 2002  . Cataract  extraction Right 07/18/14  . Cataract extraction w/phaco Left 08/01/2014    Procedure: CATARACT EXTRACTION PHACO AND INTRAOCULAR LENS PLACEMENT (IOC);  Surgeon: Leandrew Koyanagi, MD;  Location: [facility];  Service: Ophthalmology;  Laterality: Left;  IVA TOPICAL    Family History  Problem Relation Age of Onset  . Heart failure Mother     Social History History  Substance Use Topics  . Smoking status: Never Smoker   . Smokeless tobacco: Never Used  . Alcohol Use: No    Allergies  Allergen Reactions  . Prednisone     Hyperactivity   . Wheat Bran Diarrhea    Current Outpatient Prescriptions  Medication Sig Dispense Refill  . ALPRAZolam (XANAX) 0.25 MG tablet Take 0.25 mg by mouth 3 (three) times daily.     . B Complex Vitamins (VITAMIN B COMPLEX PO) Take 1 tablet by mouth daily.    . Capsicum-Garlic 536-144 MG CAPS Take 200-300 mg by mouth.    . Coenzyme Q10 (COQ-10) 100 MG CAPS Take by mouth daily.    . fludrocortisone (FLORINEF) 0.1 MG tablet     . gabapentin (NEURONTIN) 100 MG capsule Take 100 mg by mouth 2 (two) times daily.   0  .  Ginkgo Biloba (GNP GINGKO BILOBA EXTRACT PO) Take 120 mg by mouth.    Marland Kitchen HAWTHORNE BERRY PO Take by mouth daily.    Marland Kitchen ibuprofen (ADVIL,MOTRIN) 200 MG tablet Take 200 mg by mouth every 6 (six) hours as needed for moderate pain.    Marland Kitchen L-Methylfolate (DEPLIN PO) Take by mouth.    . Lutein 10 MG TABS Take 1 tablet by mouth 3 (three) times daily.    . Multiple Vitamins-Minerals (MULTIVITAMIN WITH MINERALS) tablet Take 1 tablet by mouth daily.    Marland Kitchen PARoxetine (PAXIL) 30 MG tablet Take 60 mg by mouth daily.    Marland Kitchen POTASSIUM PO Take by mouth daily.    . pramipexole (MIRAPEX) 0.125 MG tablet Take 3 pills 3 times a day. 270 tablet 5  . Saw Palmetto, Serenoa repens, 1000 MG CAPS Take 2 capsules by mouth daily.     No current facility-administered medications for this visit.    Review of Systems Review of Systems  Constitutional: Negative.    Respiratory: Negative.   Cardiovascular: Negative.     Blood pressure 98/60, pulse 60, resp. rate 12, height 5\' 8"  (1.727 m), weight 143 lb (64.864 kg).  Physical Exam Physical Exam  Constitutional: He is oriented to person, place, and time. He appears well-developed and well-nourished.  HENT:  Mouth/Throat: Oropharynx is clear and moist.  Eyes: Conjunctivae are normal. No scleral icterus.  Neck: Neck supple.  Lymphadenopathy:    He has no cervical adenopathy.  Neurological: He is alert and oriented to person, place, and time.  Skin: Skin is warm and dry.  Psychiatric: He has a normal mood and affect.    Data Reviewed Progress notes. Repeat carotid Duplex study shows stable mild plaque on both carotid bifurcations with associated stenosis Assessment    Asymptomatic carotid plaque.    Plan    Yearly f/u Doppler. Pt aware of potential symptoms of TIA and stroke.       PCP:  Kathlene November 09/28/2014, 8:23 AM

## 2014-09-28 ENCOUNTER — Encounter: Payer: Self-pay | Admitting: General Surgery

## 2014-10-03 ENCOUNTER — Other Ambulatory Visit: Payer: Self-pay | Admitting: Neurology

## 2014-10-20 ENCOUNTER — Other Ambulatory Visit: Payer: Self-pay | Admitting: Family Medicine

## 2014-10-23 ENCOUNTER — Ambulatory Visit (INDEPENDENT_AMBULATORY_CARE_PROVIDER_SITE_OTHER): Payer: Medicare PPO | Admitting: Family Medicine

## 2014-10-23 ENCOUNTER — Telehealth: Payer: Self-pay | Admitting: Family Medicine

## 2014-10-23 ENCOUNTER — Encounter (INDEPENDENT_AMBULATORY_CARE_PROVIDER_SITE_OTHER): Payer: Self-pay

## 2014-10-23 ENCOUNTER — Encounter: Payer: Self-pay | Admitting: Family Medicine

## 2014-10-23 VITALS — BP 118/66 | HR 67 | Temp 97.6°F | Resp 16 | Ht 68.0 in | Wt 143.6 lb

## 2014-10-23 DIAGNOSIS — F419 Anxiety disorder, unspecified: Secondary | ICD-10-CM | POA: Diagnosis not present

## 2014-10-23 DIAGNOSIS — F329 Major depressive disorder, single episode, unspecified: Secondary | ICD-10-CM | POA: Diagnosis not present

## 2014-10-23 DIAGNOSIS — E46 Unspecified protein-calorie malnutrition: Secondary | ICD-10-CM

## 2014-10-23 DIAGNOSIS — R251 Tremor, unspecified: Secondary | ICD-10-CM | POA: Diagnosis not present

## 2014-10-23 DIAGNOSIS — R5382 Chronic fatigue, unspecified: Secondary | ICD-10-CM | POA: Diagnosis not present

## 2014-10-23 DIAGNOSIS — F32A Depression, unspecified: Secondary | ICD-10-CM

## 2014-10-23 NOTE — Progress Notes (Signed)
Name: Joseph Arroyo   MRN: [id number]    DOB: Nov 08, 1940   Date:10/23/2014       Progress Note  Subjective  Chief Complaint  Chief Complaint  Patient presents with  . Fatigue    Pt here for 1 month follow up    HPI  Fatigue.  Patient complains of fatigue that is becoming recurrent incapacitating at times. He formerly was a regular aerobic exerciser on a consistent basis as to what was doing some weightlifting. He finds it very hard to believe he is on a consistent basis of recent. There is been some weight loss but more stabilization in recent weeks. In addition he has orthostatic hypotension contributing to his problem problems and is on Florinef for this.  Patient denies any problem with any night sweats "swelling nausea vomiting diarrhea. There is no history of thyroid disease.  Past Medical History  Diagnosis Date  . Ulcer   . Depression   . Occlusion and stenosis of carotid artery without mention of cerebral infarction   . Lumbago   . Thrombocytopenia, unspecified   . Other malaise and fatigue 11/21/2007  . Allergic rhinitis due to pollen 11/21/2007  . Contusion of toe 09/12/2008  . Anxiety state, unspecified 11/28/2008  . Brachial neuritis or radiculitis NOS   . Cramp of limb   . Abnormal involuntary movements(781.0)   . Acute upper respiratory infections of unspecified site   . Cervicalgia   . Conjunctivitis unspecified   . Sprain of hand, unspecified site   . Enthesopathy of unspecified site   . Pain in limb   . Essential and other specified forms of tremor   . Hip, thigh, leg, and ankle, insect bite, nonvenomous, without mention of infection(916.4)   . Chest pain, unspecified   . Sprain of neck   . Dysfunction of eustachian tube   . Costal chondritis   . Sleep disturbance, unspecified   . Contact dermatitis and other eczema due to plants (except food)   . Acute sinusitis, unspecified   . GERD (gastroesophageal reflux disease)     in past  . Seizures      age 28 - after concussion  . Concussion     age 11 - s/p accident  . Headache     migraines - aura - no pain  . Tremors of nervous system     Parkinsonian symptoms    Social History  Substance Use Topics  . Smoking status: Never Smoker   . Smokeless tobacco: Never Used  . Alcohol Use: No     Current outpatient prescriptions:  .  ALPRAZolam (XANAX) 0.25 MG tablet, Take 0.25 mg by mouth 3 (three) times daily. , Disp: , Rfl:  .  B Complex Vitamins (VITAMIN B COMPLEX PO), Take 1 tablet by mouth daily., Disp: , Rfl:  .  Capsicum-Garlic 409-811 MG CAPS, Take 200-300 mg by mouth., Disp: , Rfl:  .  Coenzyme Q10 (COQ-10) 100 MG CAPS, Take by mouth daily., Disp: , Rfl:  .  fludrocortisone (FLORINEF) 0.1 MG tablet, TAKE 1 TABLET BY MOUTH EVERYDAY, Disp: 30 tablet, Rfl: 5 .  gabapentin (NEURONTIN) 100 MG capsule, Take 100 mg by mouth 2 (two) times daily. , Disp: , Rfl: 0 .  Ginkgo Biloba (GNP GINGKO BILOBA EXTRACT PO), Take 120 mg by mouth., Disp: , Rfl:  .  HAWTHORNE BERRY PO, Take by mouth daily., Disp: , Rfl:  .  ibuprofen (ADVIL,MOTRIN) 200 MG tablet, Take 200 mg by mouth every 6 (  six) hours as needed for moderate pain., Disp: , Rfl:  .  L-Methylfolate (DEPLIN PO), Take by mouth., Disp: , Rfl:  .  L-Methylfolate-Algae (DEPLIN 15 PO), Take by mouth., Disp: , Rfl:  .  Lutein 10 MG TABS, Take 1 tablet by mouth 3 (three) times daily., Disp: , Rfl:  .  Multiple Vitamins-Minerals (MULTIVITAMIN WITH MINERALS) tablet, Take 1 tablet by mouth daily., Disp: , Rfl:  .  PARoxetine (PAXIL) 30 MG tablet, Take 60 mg by mouth daily., Disp: , Rfl:  .  POTASSIUM PO, Take by mouth daily., Disp: , Rfl:  .  pramipexole (MIRAPEX) 0.125 MG tablet, Take 3 pills 3 times a day., Disp: 270 tablet, Rfl: 5 .  pramipexole (MIRAPEX) 0.125 MG tablet, Take 3 tablets (0.375 mg total) by mouth 3 (three) times daily., Disp: 270 tablet, Rfl: 3 .  Saw Palmetto, Serenoa repens, 1000 MG CAPS, Take 2 capsules by mouth daily.,  Disp: , Rfl:   Allergies  Allergen Reactions  . Prednisone     Hyperactivity   . Wheat Bran Diarrhea    Review of Systems  Constitutional: Positive for weight loss and malaise/fatigue. Negative for fever and chills.  HENT: Negative for congestion, hearing loss, sore throat and tinnitus.   Eyes: Negative for blurred vision, double vision and redness.  Respiratory: Positive for shortness of breath. Negative for cough and hemoptysis.   Cardiovascular: Positive for chest pain and palpitations. Negative for orthopnea, claudication and leg swelling.  Gastrointestinal: Negative for heartburn, nausea, vomiting, diarrhea, constipation and blood in stool.  Genitourinary: Negative for dysuria, urgency, frequency and hematuria.  Musculoskeletal: Positive for back pain and joint pain. Negative for myalgias, falls and neck pain.  Skin: Negative for itching.  Neurological: Positive for tremors, sensory change and weakness. Negative for dizziness, tingling, focal weakness, seizures, loss of consciousness and headaches.  Endo/Heme/Allergies: Does not bruise/bleed easily.  Psychiatric/Behavioral: Positive for depression and memory loss. Negative for substance abuse. The patient is nervous/anxious and has insomnia.      Objective  Filed Vitals:   10/23/14 1056  BP: 118/66  Pulse: 67  Temp: 97.6 F (36.4 C)  Resp: 16  Height: 5\' 8"  (1.727 m)  Weight: 143 lb 9 oz (65.12 kg)  SpO2: 95%     Physical Exam  Constitutional: He is oriented to person, place, and time.  HENT:  Head: Normocephalic.  Eyes: EOM are normal. Pupils are equal, round, and reactive to light.  Neck: Normal range of motion. Neck supple. No thyromegaly present.  Cardiovascular: Normal rate, regular rhythm and normal heart sounds.   No murmur heard. Pulmonary/Chest: Effort normal and breath sounds normal. No respiratory distress. He has no wheezes.  Abdominal: Soft. Bowel sounds are normal.  Musculoskeletal: He exhibits no  edema.  Lymphadenopathy:    He has no cervical adenopathy.  Neurological: He is oriented to person, place, and time. No cranial nerve deficit. Coordination normal.  Patient has chronic tremor and some cogwheeling of the upper extremities. He is hyperreflexic as well gait is antalgic  Skin: Skin is warm and dry. No rash noted.  Psychiatric:  Mood is somewhat labile with patient being quite loquacious times.      Assessment & Plan   1. Chronic fatigue Probably multifactorial - CBC with Differential/Platelet - Comprehensive metabolic panel - TSH - I96 - Vitamin D (25 hydroxy)  2. Tremor Consistent with early Parkinson's disease by my exam  3. Depression Long-standing and followed by psychiatry  4. Chronic anxiety Long-standing  and followed by psychiatry  5. Malnutrition Weight stabilizing on insurance shakes on a daily basis

## 2014-10-23 NOTE — Telephone Encounter (Signed)
Pt would like to know if he still needs to take the Fludrocortisone? Please advise pt.

## 2014-10-24 LAB — CBC WITH DIFFERENTIAL/PLATELET
Basophils Absolute: 0.1 10*3/uL (ref 0.0–0.2)
Basos: 1 %
EOS (ABSOLUTE): 0.3 10*3/uL (ref 0.0–0.4)
EOS: 5 %
Hematocrit: 45.1 % (ref 37.5–51.0)
Hemoglobin: 15.3 g/dL (ref 12.6–17.7)
IMMATURE GRANS (ABS): 0 10*3/uL (ref 0.0–0.1)
IMMATURE GRANULOCYTES: 0 %
LYMPHS: 30 %
Lymphocytes Absolute: 1.7 10*3/uL (ref 0.7–3.1)
MCH: 29.9 pg (ref 26.6–33.0)
MCHC: 33.9 g/dL (ref 31.5–35.7)
MCV: 88 fL (ref 79–97)
Monocytes Absolute: 0.3 10*3/uL (ref 0.1–0.9)
Monocytes: 5 %
NEUTROS PCT: 59 %
Neutrophils Absolute: 3.4 10*3/uL (ref 1.4–7.0)
PLATELETS: 150 10*3/uL (ref 150–379)
RBC: 5.11 x10E6/uL (ref 4.14–5.80)
RDW: 13.3 % (ref 12.3–15.4)
WBC: 5.7 10*3/uL (ref 3.4–10.8)

## 2014-10-24 LAB — COMPREHENSIVE METABOLIC PANEL
A/G RATIO: 1.5 (ref 1.1–2.5)
ALT: 33 IU/L (ref 0–44)
AST: 27 IU/L (ref 0–40)
Albumin: 4.3 g/dL (ref 3.5–4.8)
Alkaline Phosphatase: 58 IU/L (ref 39–117)
BUN/Creatinine Ratio: 21 (ref 10–22)
BUN: 19 mg/dL (ref 8–27)
Bilirubin Total: 0.6 mg/dL (ref 0.0–1.2)
CALCIUM: 9.4 mg/dL (ref 8.6–10.2)
CO2: 28 mmol/L (ref 18–29)
Chloride: 99 mmol/L (ref 97–108)
Creatinine, Ser: 0.89 mg/dL (ref 0.76–1.27)
GFR calc Af Amer: 99 mL/min/{1.73_m2} (ref >59)
GFR, EST NON AFRICAN AMERICAN: 85 mL/min/{1.73_m2} (ref >59)
Globulin, Total: 2.9 g/dL (ref 1.5–4.5)
Glucose: 85 mg/dL (ref 65–99)
Potassium: 4.5 mmol/L (ref 3.5–5.2)
Sodium: 141 mmol/L (ref 134–144)
TOTAL PROTEIN: 7.2 g/dL (ref 6.0–8.5)

## 2014-10-24 LAB — VITAMIN D 25 HYDROXY (VIT D DEFICIENCY, FRACTURES): VIT D 25 HYDROXY: 23.5 ng/mL — AB (ref 30.0–100.0)

## 2014-10-24 LAB — TSH: TSH: 1.98 u[IU]/mL (ref 0.450–4.500)

## 2014-10-24 LAB — VITAMIN B12: Vitamin B-12: 690 pg/mL (ref 211–946)

## 2014-10-24 NOTE — Telephone Encounter (Signed)
yes

## 2014-10-25 ENCOUNTER — Other Ambulatory Visit: Payer: Self-pay

## 2014-10-25 MED ORDER — VITAMIN D (ERGOCALCIFEROL) 1.25 MG (50000 UNIT) PO CAPS: 50000.0000 [IU] | ORAL_CAPSULE | ORAL | Status: DC

## 2014-10-26 DIAGNOSIS — F332 Major depressive disorder, recurrent severe without psychotic features: Secondary | ICD-10-CM | POA: Diagnosis not present

## 2014-11-27 DIAGNOSIS — F332 Major depressive disorder, recurrent severe without psychotic features: Secondary | ICD-10-CM | POA: Diagnosis not present

## 2014-12-25 ENCOUNTER — Telehealth: Payer: Self-pay | Admitting: Neurology

## 2014-12-25 NOTE — Telephone Encounter (Signed)
Has appt 11/1 with you. Do we need to make an changes before he come back in?

## 2014-12-25 NOTE — Telephone Encounter (Signed)
There will always be good days and bad days, sometimes due to a trigger, such as more anxiety, physical exertion, dehydration, stress, constipation, blood sugar fluctuations etc., and sometimes there is no trigger that is obvious. I would not recommend any changes at this time. We can discuss during his upcoming follow-up appointment. Please relay to patient or wife.

## 2014-12-25 NOTE — Telephone Encounter (Signed)
Joseph Arroyo ([place]) at [facility] instructed to call to touch base with Dr Rexene Alberts. Pt sts Friday and Saturday he was having a little more trembling than usual . Having some low back pain difficult to walk or stand long periods, he still doing exercises that Neuro rehab taught him. Still working out at gym, biking can be a little difficult at times.  If need to reach him please call [phone]

## 2014-12-26 NOTE — Telephone Encounter (Signed)
Left message on vm with advice below. I advised that [name] will assess more and make any changes at next visit. Left appt time and date.

## 2014-12-27 DIAGNOSIS — F332 Major depressive disorder, recurrent severe without psychotic features: Secondary | ICD-10-CM | POA: Diagnosis not present

## 2015-01-01 ENCOUNTER — Encounter: Payer: Self-pay | Admitting: Neurology

## 2015-01-01 ENCOUNTER — Ambulatory Visit (INDEPENDENT_AMBULATORY_CARE_PROVIDER_SITE_OTHER): Payer: Medicare PPO | Admitting: Neurology

## 2015-01-01 VITALS — BP 122/68 | HR 68 | Resp 14 | Ht 68.0 in | Wt 145.0 lb

## 2015-01-01 DIAGNOSIS — F411 Generalized anxiety disorder: Secondary | ICD-10-CM

## 2015-01-01 DIAGNOSIS — G20 Parkinson's disease: Secondary | ICD-10-CM

## 2015-01-01 DIAGNOSIS — M545 Low back pain, unspecified: Secondary | ICD-10-CM
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
## 2015-01-01 NOTE — Patient Instructions (Addendum)
We will keep your medication the same for now until you see your psychiatrist this week. We may try a new medication, called Rytary 95 mg strength: take 1 pill three times a day, again, away from your mealtimes! Side effects are similar to the ones with Sinemet. Call next week for an update. We may also consider retrying Sinemet again.  Talk to Dr. Clovis Pu about perhaps trying lamictal in lieu of gabapentin - however, you may not be a candidate for it, but you feel sleepy from the gabapentin.  I will see you back in 3 months.

## 2015-01-04 DIAGNOSIS — F332 Major depressive disorder, recurrent severe without psychotic features: Secondary | ICD-10-CM | POA: Diagnosis not present

## 2015-01-22 ENCOUNTER — Other Ambulatory Visit: Payer: Self-pay | Admitting: Emergency Medicine

## 2015-01-22 MED ORDER — FLUDROCORTISONE ACETATE 0.1 MG PO TABS: 100.0000 ug | ORAL_TABLET | Freq: Every day | ORAL | Status: DC

## 2015-01-22 NOTE — Telephone Encounter (Signed)
Script with refills sent to pharmacy

## 2015-01-23 ENCOUNTER — Other Ambulatory Visit: Payer: Self-pay

## 2015-01-23 MED ORDER — PRAMIPEXOLE DIHYDROCHLORIDE 0.125 MG PO TABS: ORAL_TABLET | ORAL | Status: DC

## 2015-01-23 NOTE — Telephone Encounter (Signed)
Pharmacy requests 90 day Rx  

## 2015-01-31 ENCOUNTER — Telehealth: Payer: Self-pay | Admitting: Neurology

## 2015-01-31 DIAGNOSIS — G20 Parkinson's disease: Secondary | ICD-10-CM

## 2015-01-31 MED ORDER — CARBIDOPA-LEVODOPA ER 23.75-95 MG PO CPCR: 95.0000 mg | ORAL_CAPSULE | Freq: Three times a day (TID) | ORAL | Status: DC

## 2015-01-31 NOTE — Telephone Encounter (Signed)
Patient was advised to call back after going to [facility]. States he saw Dr. Clovis Pu and [name] and Dr. Clovis Pu changed medication, weaned him off of Gabapentin and is trying Saphris, started out on lowest dose 2.5 mg - 1/2 tablet once a day at bedtime. [name] was considering taking off of Mirapex and starting Rytary.

## 2015-01-31 NOTE — Telephone Encounter (Signed)
Please have him reduce Mirapex to 2 pills tid, then start Rytary 95 mg strength, Take 1 pill tid (8AM, 12, 4PM) x 1 week, then 2 pills tid x 1 week, then, 3 pills tid thereafter. Reminders: Take Rytary away from your mealtimes and side effects are similar to the ones with Sinemet. Call in about 3 weeks to report, how Rytary is working for him. At that time we can fine tune the dose.

## 2015-01-31 NOTE — Telephone Encounter (Signed)
I spoke to patient and wife and gave them information below. They are seeing if insurance will approve Rytary. I answered all the questions that I could.   Jessica: Will you give them a call? They are concerned about Rytary and the prior-authorization process. Also they are getting ready to change insurance and wonders how that will affect them. Thank you so much.

## 2015-02-01 NOTE — Telephone Encounter (Signed)
I called the pharmacy.  Spoke with [place].  She said the Rx is rejecting.  Indicates the message does not say a prior auth is required, just that the drug is not covered under his plan.  I contacted ins.  Provided clinical info.  They are currently reviewing the policy and request for coverage Ref # [id number]  I called the patient back to advise.  Got no answer.  Left message.  Once he changes ins, we can certainly contact them to initiate another prior auth if needed.  We will have to obtain new ins info in order to proceed.    [employer] has approved the request for coverage on Rytary effective until 01/31/2017, or until the policy changes or is terminated Ref # [id number]

## 2015-02-07 NOTE — Telephone Encounter (Signed)
I spoke to patient. He had a few questions about the medicine like would it be ok to start next week since he has an event this weekend, and should he get a 3 month's supply since he is getting ready to change insurance. I reassured him about his new medication and advised him that it would be his choice to get a 3 month supply (cost is a factor). And per [name], it would be ok to start next week. I advised him to call us back with any questions.

## 2015-02-07 NOTE — Telephone Encounter (Signed)
Patient is calling with questions about medication Carbidopa-Levodopa ER (RYTARY) 23.75-95 MG CPCR. 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 Please call.

## 2015-02-11 NOTE — Telephone Encounter (Signed)
Patient called to request that [name] request a tier exception on Carbidopa-Levodopa ER (RYTARY) 23.75-95 MG CPCR for the 3 month supply

## 2015-02-12 NOTE — Telephone Encounter (Signed)
Patient called back regarding tier exception on Carbidopa-Lvodopa ER (RYTARY) for the 3 month supply

## 2015-02-13 ENCOUNTER — Telehealth: Payer: Self-pay

## 2015-02-13 DIAGNOSIS — F332 Major depressive disorder, recurrent severe without psychotic features: Secondary | ICD-10-CM | POA: Diagnosis not present

## 2015-02-13 DIAGNOSIS — G20 Parkinson's disease: Secondary | ICD-10-CM

## 2015-02-13 MED ORDER — CARBIDOPA-LEVODOPA ER 23.75-95 MG PO CPCR: 95.0000 mg | ORAL_CAPSULE | Freq: Three times a day (TID) | ORAL | Status: DC

## 2015-02-13 NOTE — Telephone Encounter (Signed)
I apologize for the delay.  I have been out of the office.  It does not appear the Rx was changed to 90 days, so it has been updated and resent for 3 month supply per patient request.  As well, we have contacted ins and asked them to consider a tier exception.  This request is currently under review Ref # [id number] I called the patient back to advise. They expressed understanding and appreciation.

## 2015-02-14 ENCOUNTER — Telehealth: Payer: Self-pay | Admitting: Neurology

## 2015-02-14 NOTE — Telephone Encounter (Signed)
Pt called back inquiring if there were samples if Carbidopa-Levodopa ER (RYTARY) 23.75-95 MG CPCR while he is waiting for prior auth. Please call and advise

## 2015-02-14 NOTE — Telephone Encounter (Signed)
Patient called back requesting to speak with Janett Billow

## 2015-02-14 NOTE — Telephone Encounter (Signed)
[employer] has approved the request for a tier exception on Rytary.  I called and spoke with Joseph Arroyo.  She expressed understanding and appreciation.

## 2015-02-14 NOTE — Telephone Encounter (Signed)
Called and spoke to patient wife samples ready for for pick up Rytary 23.75 mg/95 mg . Patient's wife relayed they may pick up 02/19/2015.

## 2015-02-14 NOTE — Telephone Encounter (Signed)
[employer] has approved the request for a tier exception on Rytary.  I called and spoke with Ms Patman.  She expressed understanding and appreciation.

## 2015-02-26 ENCOUNTER — Ambulatory Visit: Payer: Medicare PPO

## 2015-02-26 ENCOUNTER — Ambulatory Visit: Payer: Medicare PPO | Admitting: Occupational Therapy

## 2015-02-26 ENCOUNTER — Ambulatory Visit: Payer: Medicare PPO | Attending: Family Medicine | Admitting: Physical Therapy

## 2015-02-26 DIAGNOSIS — R279 Unspecified lack of coordination: Secondary | ICD-10-CM | POA: Insufficient documentation

## 2015-02-26 DIAGNOSIS — R269 Unspecified abnormalities of gait and mobility: Secondary | ICD-10-CM

## 2015-02-26 DIAGNOSIS — R471 Dysarthria and anarthria: Secondary | ICD-10-CM

## 2015-02-26 DIAGNOSIS — R258 Other abnormal involuntary movements: Secondary | ICD-10-CM | POA: Insufficient documentation

## 2015-02-26 DIAGNOSIS — R278 Other lack of coordination: Secondary | ICD-10-CM

## 2015-02-26 NOTE — Therapy (Signed)
[facility] [address] [place], [place], [address] Phone: [phone]   Fax:  [phone]  Patient Details  Name: Joseph Arroyo MRN: [id number] Date of Birth: December 23, 1940 Referring Provider:  Ashok Norris, MD  Encounter Date: 02/26/2015    Occupational Therapy Parkinson's Disease Screen   9-hole peg test:    RUE  30.82sec        LUE  34.06sec  Box & Blocks Test:   RUE  42 blocks        LUE  31 blocks  Other comments:  Pt has not been performing coordination HEP much.  Pt would benefit from occupational therapy evaluation due to  Decline in coordination from 07/2014 (discharge from OT) per 9-hole peg test and box and blocks test.   [facility] 02/26/2015, 1:53 PM  [facility] [address] [place], [place], [address] Phone: [phone]   Fax:  [phone]

## 2015-02-26 NOTE — Therapy (Signed)
[facility] [address] [place], [place], [address] Phone: [phone]   Fax:  [phone]  Patient Details  Name: Joseph Arroyo MRN: [id number] Date of Birth: December 03, 1940 Referring Provider:  Star Age, MD  Encounter Date: 02/26/2015  Speech Therapy Parkinson's Disease Screen  Decibel Level today: 68dB  (WNL=70-72 dB) with sound level meter 30cm away from pt's mouth. As conversation progressed past 6 minutes loudness decreased to closer to 66dB. In environments of mod background noise pt is more difficult to understand.  Pt has experienced difficulty in swallowing warranting objective evaluation with dry or particulate items. He would benefit from a modified barium swallow examination.  Pt would benefit from a speech-language eval for dysarthria. If agreed, please send a prescription via [facility].    [facility] , [place], [place]  02/26/2015, 1:51 PM  [facility] [address] [place], [place], [address] Phone: [phone]   Fax:  [phone]

## 2015-02-26 NOTE — Therapy (Signed)
[facility] [address] [place], [place], [address] Phone: [phone]   Fax:  [phone]  Patient Details  Name: Joseph Arroyo MRN: [id number] Date of Birth: Feb 16, 1941 Referring Provider:  Ashok Norris, MD    Physical Therapy Parkinson's Disease Screen  Pt denies falls but does report some near falls with steps or getting up too fast.  Wife does state that she purchased walking poles but has not been able to use them and would like to be instructed on how to use them.  Timed Up and Go test:  Normal 8.8 seconds;Cognitive 9.4 seconds  10 meter walk test:  7.1 seconds; 4.62 ft/second  5 time sit to stand test:  12.9 seconds with no posterior lean  Patient would benefit from Physical Therapy evaluation due to wife's request at update for HEP utilizing walking poles.  Due to seeing pt >1 year ago for PT, pt would benefit from PT evaluation to assess overall functional mobility and update HEP as appropriate.    Measures were reviewed by Mady Haagensen, PT and decision made to follow up with request for PT order.  Mady Haagensen, PT 03/01/2015 1:56 PM Phone: [phone] Fax: [phone]     Encounter Date: 02/26/2015   Narda Bonds 02/26/2015, 1:14 PM  [facility] [address] [place], [place], [address] Phone: [phone]   Fax:  [phone]   Einar Grad Stewardson 02/26/2015 1:14 PM Phone: [phone] Fax: [phone] Frazier Butt., PT

## 2015-02-27 ENCOUNTER — Telehealth: Payer: Self-pay | Admitting: Neurology

## 2015-02-27 ENCOUNTER — Encounter: Payer: Self-pay | Admitting: Family Medicine

## 2015-02-27 ENCOUNTER — Ambulatory Visit (INDEPENDENT_AMBULATORY_CARE_PROVIDER_SITE_OTHER): Payer: Medicare PPO | Admitting: Family Medicine

## 2015-02-27 VITALS — BP 102/60 | HR 95 | Temp 98.7°F | Resp 18 | Ht 68.0 in | Wt 145.9 lb

## 2015-02-27 DIAGNOSIS — R2681 Unsteadiness on feet: Secondary | ICD-10-CM | POA: Diagnosis not present

## 2015-02-27 DIAGNOSIS — R252 Cramp and spasm: Secondary | ICD-10-CM

## 2015-02-27 DIAGNOSIS — R0789 Other chest pain: Secondary | ICD-10-CM

## 2015-02-27 DIAGNOSIS — R251 Tremor, unspecified: Secondary | ICD-10-CM

## 2015-02-27 DIAGNOSIS — E46 Unspecified protein-calorie malnutrition: Secondary | ICD-10-CM | POA: Diagnosis not present

## 2015-02-27 DIAGNOSIS — F419 Anxiety disorder, unspecified: Secondary | ICD-10-CM

## 2015-02-27 DIAGNOSIS — F329 Major depressive disorder, single episode, unspecified: Secondary | ICD-10-CM | POA: Diagnosis not present

## 2015-02-27 DIAGNOSIS — E559 Vitamin D deficiency, unspecified: Secondary | ICD-10-CM | POA: Diagnosis not present

## 2015-02-27 DIAGNOSIS — R5382 Chronic fatigue, unspecified: Secondary | ICD-10-CM

## 2015-02-27 DIAGNOSIS — F32A Depression, unspecified: Secondary | ICD-10-CM

## 2015-02-27 DIAGNOSIS — G20 Parkinson's disease: Secondary | ICD-10-CM | POA: Diagnosis not present

## 2015-02-27 DIAGNOSIS — G20A1 Parkinson's disease without dyskinesia, without mention of fluctuations: Secondary | ICD-10-CM

## 2015-02-27 NOTE — Telephone Encounter (Signed)
Patient called to advise he saw GP today and was suggested to check with [name] regarding Carbidopa-Levodopa ER (RYTARY) 23.75-95 MG CPCR , pramipexole (MIRAPEX) 0.125 MG tablet   they suggests going to 2- three times a day for MIRAPEX, states the other office gave him Saphris, not sure which combination but feels something is not working well, something has increased anxiety and depression.
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
## 2015-02-28 ENCOUNTER — Other Ambulatory Visit: Payer: Self-pay

## 2015-02-28 DIAGNOSIS — G20 Parkinson's disease: Secondary | ICD-10-CM

## 2015-02-28 LAB — COMPREHENSIVE METABOLIC PANEL
ALK PHOS: 56 IU/L (ref 39–117)
ALT: 11 IU/L (ref 0–44)
AST: 20 IU/L (ref 0–40)
Albumin/Globulin Ratio: 1.6 (ref 1.1–2.5)
Albumin: 4.3 g/dL (ref 3.5–4.8)
BILIRUBIN TOTAL: 0.6 mg/dL (ref 0.0–1.2)
BUN / CREAT RATIO: 18 (ref 10–22)
BUN: 15 mg/dL (ref 8–27)
CO2: 27 mmol/L (ref 18–29)
Calcium: 9.1 mg/dL (ref 8.6–10.2)
Chloride: 100 mmol/L (ref 96–106)
Creatinine, Ser: 0.84 mg/dL (ref 0.76–1.27)
GFR calc Af Amer: 100 mL/min/{1.73_m2} (ref >59)
GFR calc non Af Amer: 87 mL/min/{1.73_m2} (ref >59)
GLOBULIN, TOTAL: 2.7 g/dL (ref 1.5–4.5)
Glucose: 94 mg/dL (ref 65–99)
POTASSIUM: 4.1 mmol/L (ref 3.5–5.2)
SODIUM: 141 mmol/L (ref 134–144)
Total Protein: 7 g/dL (ref 6.0–8.5)

## 2015-02-28 LAB — VITAMIN D 25 HYDROXY (VIT D DEFICIENCY, FRACTURES): VIT D 25 HYDROXY: 34.4 ng/mL (ref 30.0–100.0)

## 2015-02-28 LAB — MAGNESIUM: MAGNESIUM: 2.2 mg/dL (ref 1.6–2.3)

## 2015-02-28 NOTE — Telephone Encounter (Signed)
I spoke to wife.  PCP has taken patient off of gabapentin.  [facility] started patient on Saphris 2.5 but he is only taking 1/2 tab a day. He is still taking paxil and xanax daily.  Currently taking Rytary (since Sat) 2 cap TID and 2 Miripex TID.  Wife reports that patient has had an increase in his depression and wonders if this may be cause by his medications?  She asks if you want to wean him off of Miripex? Or if you keep him on it, is it ok to take Rytary and Miripex at the same times?

## 2015-02-28 NOTE — Telephone Encounter (Signed)
Please find out how much Rytary and Mirapex he is taking at this time. When he was on Mirapex 3 pills tid, he was more sleepy and therefore reduced it.  Please advise, that we can increase Rytary if needed.

## 2015-02-28 NOTE — Telephone Encounter (Signed)
I don't think the Rytary or Mirapex are worsening his depression and anxiety. Please have him continue with both meds at this time, we will increase the Rytary gradually if possible, 3 pill tid next, about a week or 2 from when he first started.

## 2015-02-28 NOTE — Addendum Note (Signed)
Addended by: Laurence Spates on: 02/28/2015 03:07 PM   Modules accepted: Orders, Medications

## 2015-02-28 NOTE — Telephone Encounter (Signed)
I spoke to wife and gave results below. She voiced understanding and would like to try to take Rytary and Miripex 21min apart in hopes to help the patient not to be too sleepy.

## 2015-03-06 ENCOUNTER — Telehealth: Payer: Self-pay | Admitting: Emergency Medicine

## 2015-03-06 NOTE — Telephone Encounter (Signed)
Patient wife notified of lab results

## 2015-03-14 NOTE — Telephone Encounter (Signed)
I think we should consider a second opinion with a movement disorder specialist at [facility] or [facility]. If patient okay, I can make a referral. I have a good raport with Dr. Linus Mako at [facility] and he is a highly skilled PD expert. Pls make a referral if patient Ok.  In addition, we will talk about tweaking his medication at his next appointment which is less than a month from now.

## 2015-03-14 NOTE — Addendum Note (Signed)
Addended by: Laurence Spates on: 03/14/2015 05:26 PM   Modules accepted: Orders, Medications

## 2015-03-14 NOTE — Telephone Encounter (Signed)
I spoke to wife. She is aware of recommendations below. She will talk with patient about referral and call us back tomorrow.

## 2015-03-14 NOTE — Telephone Encounter (Addendum)
Pt called and says his shaking is not decreasing. He says that he is taking the rytary 3 pills 3 x a day. He also says that his anxiety level is a little high as well. He wants to know if [name] thinks he has been on the medication long enough to have and affect. Please call and advise [phone]

## 2015-03-19 NOTE — Telephone Encounter (Signed)
Wife reports that they will call back with questions if needed. Or will discuss at next visit in 2 weeks.

## 2015-03-20 ENCOUNTER — Ambulatory Visit: Payer: Medicare Other | Attending: Neurology | Admitting: Rehabilitative and Restorative Service Providers"

## 2015-03-20 DIAGNOSIS — R279 Unspecified lack of coordination: Secondary | ICD-10-CM | POA: Diagnosis present

## 2015-03-20 DIAGNOSIS — Z7409 Other reduced mobility: Secondary | ICD-10-CM | POA: Diagnosis present

## 2015-03-20 DIAGNOSIS — R269 Unspecified abnormalities of gait and mobility: Secondary | ICD-10-CM

## 2015-03-20 DIAGNOSIS — M545 Low back pain: Secondary | ICD-10-CM | POA: Diagnosis present

## 2015-03-20 DIAGNOSIS — R278 Other lack of coordination: Secondary | ICD-10-CM

## 2015-03-21 DIAGNOSIS — R269 Unspecified abnormalities of gait and mobility: Secondary | ICD-10-CM | POA: Diagnosis not present

## 2015-03-21 NOTE — Therapy (Signed)
[facility] [address] [place], [place], [address] Phone: [phone]   Fax:  [phone]  Physical Therapy Evaluation  Patient Details  Name: Joseph Arroyo MRN: [id number] Date of Birth: [birth year] Referring Provider: Dr. Star Age  Encounter Date: 03/20/2015      PT End of Session - 03/21/15 1241    Visit Number 1   Number of Visits 8   Date for PT Re-Evaluation 04/19/15   Authorization Type UHC state / G code every 10th visit?   PT Start Time 1150   PT Stop Time 1240   PT Time Calculation (min) 50 min   Activity Tolerance Patient tolerated treatment well   Behavior During Therapy WFL for tasks assessed/performed      Past Medical History  Diagnosis Date  . Ulcer   . Depression   . Occlusion and stenosis of carotid artery without mention of cerebral infarction   . Lumbago   . Thrombocytopenia, unspecified ([place])   . Other malaise and fatigue 11/21/2007  . Allergic rhinitis due to pollen 11/21/2007  . Contusion of toe 09/12/2008  . Anxiety state, unspecified 11/28/2008  . Brachial neuritis or radiculitis NOS   . Cramp of limb   . Abnormal involuntary movements(781.0)   . Acute upper respiratory infections of unspecified site   . Cervicalgia   . Conjunctivitis unspecified   . Sprain of hand, unspecified site   . Enthesopathy of unspecified site   . Pain in limb   . Essential and other specified forms of tremor   . Hip, thigh, leg, and ankle, insect bite, nonvenomous, without mention of infection(916.4)   . Chest pain, unspecified   . Sprain of neck   . Dysfunction of eustachian tube   . Costal chondritis   . Sleep disturbance, unspecified   . Contact dermatitis and other eczema due to plants (except food)   . Acute sinusitis, unspecified   . GERD (gastroesophageal reflux disease)     in past  . Seizures ([place])     age 75 - after concussion  . Concussion     age 75 - s/p accident  .  Headache     migraines - aura - no pain  . Tremors of nervous system     Parkinsonian symptoms    Past Surgical History  Procedure Laterality Date  . Other surgical history  2002    ear surgery  . Colonoscopy  2008  . Stapedes surgery Right 2002  . Cataract extraction Right 07/18/14  . Cataract extraction w/phaco Left 08/01/2014    Procedure: CATARACT EXTRACTION PHACO AND INTRAOCULAR LENS PLACEMENT (IOC);  Surgeon: Leandrew Koyanagi, MD;  Location: [facility];  Service: Ophthalmology;  Laterality: Left;  IVA TOPICAL    There were no vitals filed for this visit.  Visit Diagnosis:  Abnormality of gait  Decreased coordination  Decreased functional mobility and endurance  Low back pain without sciatica, unspecified back pain laterality      Subjective Assessment - 03/20/15 1157    Subjective The patient returns to PT today after screening for PT and recommendations for updated HEP for mobility.   Patient Stated Goals improve low back and neck discomfort, update home program   Currently in Pain? Yes   Pain Score 7   yesterday 7/10 while trying to wash car, minimal today   Pain Location Back   Pain Orientation Lower   Pain Descriptors / Indicators Discomfort   Pain Type Chronic pain  Pain Onset More than a month ago   Pain Frequency Intermittent   Aggravating Factors  bending, stooping (like checking tires), ridingin a car for a long time   Pain Relieving Factors rest, tylenol.            [facility] PT Assessment - 03/20/15 1202    Assessment   Medical Diagnosis Parkinson's Disease   Referring Provider Dr. Star Age   Prior Therapy known to our clinic from prior PT for Parkinson's   Precautions   Precautions Fall   Balance Screen   Has the patient fallen in the past 6 months Yes   How many times? 1  slipped getting out of bed   Has the patient had a decrease in activity level because of a fear of falling?  No   Is the patient reluctant to leave their home  because of a fear of falling?  No   Home Environment   Living Environment Private residence   Living Arrangements Spouse/significant other   Type of [place] Access Stairs to enter   Entrance Stairs-Number of Steps 3   Prior Function   Level of Independence Independent   Posture/Postural Control   Posture/Postural Control Postural limitations   Postural Limitations Decreased lumbar lordosis   ROM / Strength   AROM / PROM / Strength AROM;Strength   AROM   Overall AROM Comments WFLs    Strength   Overall Strength Within functional limits for tasks performed   Transfers   Comments Independent with sit<>stand without UE support   Ambulation/Gait   Ambulation/Gait Yes   Ambulation/Gait Assistance 7: Independent   Ambulation Distance (Feet) 300 Feet   Assistive device None   Gait Pattern Poor foot clearance - left;Narrow base of support   Ambulation Surface Level   Functional Gait  Assessment   Gait assessed  Yes   Gait Level Surface Walks 20 ft in less than 5.5 sec, no assistive devices, good speed, no evidence for imbalance, normal gait pattern, deviates no more than 6 in outside of the 12 in walkway width.   Change in Gait Speed Able to smoothly change walking speed without loss of balance or gait deviation. Deviate no more than 6 in outside of the 12 in walkway width.   Gait with Horizontal Head Turns Performs head turns smoothly with slight change in gait velocity (eg, minor disruption to smooth gait path), deviates 6-10 in outside 12 in walkway width, or uses an assistive device.   Gait with Vertical Head Turns Performs task with slight change in gait velocity (eg, minor disruption to smooth gait path), deviates 6 - 10 in outside 12 in walkway width or uses assistive device   Gait and Pivot Turn Pivot turns safely in greater than 3 sec and stops with no loss of balance, or pivot turns safely within 3 sec and stops with mild imbalance, requires small steps to catch balance.    Step Over Obstacle Is able to step over 2 stacked shoe boxes taped together (9 in total height) without changing gait speed. No evidence of imbalance.   Gait with Narrow Base of Support Ambulates less than 4 steps heel to toe or cannot perform without assistance.   Gait with Eyes Closed Walks 20 ft, uses assistive device, slower speed, mild gait deviations, deviates 6-10 in outside 12 in walkway width. Ambulates 20 ft in less than 9 sec but greater than 7 sec.   Ambulating Backwards Walks 20 ft, uses assistive  device, slower speed, mild gait deviations, deviates 6-10 in outside 12 in walkway width.   Steps Alternating feet, no rail.   Total Score 22   FGA comment: 22/30                           PT Education - 03/21/15 1240    Education provided Yes   Education Details discussed lying prone for low back stretch   Person(s) Educated Patient;Child(ren)   Methods Explanation;Demonstration   Comprehension Verbalized understanding;Returned demonstration          PT Short Term Goals - 03/21/15 1243    PT SHORT TERM GOAL #1   Title STGs=LTGs           PT Long Term Goals - 03/21/15 1254    PT LONG TERM GOAL #1   Title The patient will be independent with HEP for post d/c progression of activities.   Baseline Target date 04/19/2015   Time 4   Period Weeks   PT LONG TERM GOAL #2   Title The patient will improve FGA from 22/30 to > or equal to 26/30 to demo improved dynamic postural control   Baseline Target date 04/19/2015   Time 4   Period Weeks   PT LONG TERM GOAL #3   Title The patient will decrease low back from to < or equal to 4/10 (reports 7/10) at worst.   Baseline Target date 04/19/2015   Time 4   Period Weeks   PT LONG TERM GOAL #4   Title The patient will verbalize self mgmt techniques for low back and neck discomfort.   Baseline Target date 04/19/2015   Time 4   Period Weeks   PT LONG TERM GOAL #5   Title Further goals to follow on 5xsit<>stand  or gait speed, as indicated               Plan - 03/20/15 1232    Clinical Impression Statement The patient is a 75 yo male known to our clinic from prior treatment for Parkinson's disease.  He returns to clinic today reporting worsening back discomfort limiting mobility, and need for further progression of HEP.   Pt will benefit from skilled therapeutic intervention in order to improve on the following deficits Abnormal gait;Difficulty walking;Postural dysfunction;Decreased mobility;Decreased balance;Decreased activity tolerance   Rehab Potential Good   PT Frequency 2x / week   PT Duration 4 weeks   PT Treatment/Interventions Therapeutic exercise;Therapeutic activities;Functional mobility training;Balance training;Neuromuscular re-education;Manual techniques;Stair training;Gait training;Patient/family education   PT Next Visit Plan dynamic balance, neck/back stretching/postural training, high level balance.  PERFORM 5XSIT<>STAND, GAIT SPEED   Consulted and Agree with Plan of Care Patient         Problem List Patient Active Problem List   Diagnosis Date Noted  . Chronic fatigue 10/23/2014  . Chronic anxiety 10/23/2014  . Parkinson's disease ([place]) 09/20/2014  . Malnutrition ([place]) 09/20/2014  . Depression 07/26/2013  . Unsteady gait 07/26/2013  . Orthostatic hypotension 07/26/2013  . Anxiety 06/23/2013  . Chest pain 06/23/2013  . Tremor 05/18/2013  . Occlusion and stenosis of carotid artery without mention of cerebral infarction 08/30/2012    [place], PT 03/21/2015, 1:08 PM  [facility] [address] [place], [place], [address] Phone: [phone]   Fax:  [phone]  Name: JAYSION CAPIZZI MRN: [id number] Date of Birth: [birth year]

## 2015-03-29 ENCOUNTER — Ambulatory Visit: Payer: Medicare Other | Admitting: Rehabilitative and Restorative Service Providers"

## 2015-03-29 DIAGNOSIS — M545 Low back pain: Secondary | ICD-10-CM

## 2015-03-29 DIAGNOSIS — R269 Unspecified abnormalities of gait and mobility: Secondary | ICD-10-CM | POA: Diagnosis not present

## 2015-03-29 NOTE — Patient Instructions (Signed)
Scapular Retraction: Abduction (Prone)    Lie with upper arms straight out from sides, elbows bent to 90. Pinch shoulder blades together and raise arms a few inches from floor. Repeat __10__ times per set. Do __2__ sets per session. Do _1-2___ sessions per day.  http://orth.exer.us/956   Copyright  VHI. All rights reserved.  Flexion: Stretch - Quadriceps (Prone)    *USE A YOGA STRAP TO PULL HEEL TOWARDS BOTTOM.  Try not to let left leg turn in. CAUTION: Patient should feel stretch along front of thigh. There should be no pain in knee joint. Hold _30__ seconds. Repeat _3__ times. Repeat with other leg. Do _1-2__ sessions per day.   Copyright  VHI. All rights reserved.   *TRUNK ROTATION ON SHEET

## 2015-03-30 NOTE — Therapy (Signed)
[facility] [address] [place], [place], [address] Phone: [phone]   Fax:  [phone]  Physical Therapy Treatment  Patient Details  Name: Joseph Arroyo MRN: [id number] Date of Birth: 1940-04-05 Referring Provider: Dr. Star Age  Encounter Date: 03/29/2015      PT End of Session - 03/30/15 0742    Visit Number 2   Number of Visits 8   Date for PT Re-Evaluation 04/19/15   Authorization Type UHC state / G code every 10th visit?   PT Start Time 1020   PT Stop Time 1102   PT Time Calculation (min) 42 min   Activity Tolerance Patient tolerated treatment well   Behavior During Therapy WFL for tasks assessed/performed      Past Medical History  Diagnosis Date  . Ulcer   . Depression   . Occlusion and stenosis of carotid artery without mention of cerebral infarction   . Lumbago   . Thrombocytopenia, unspecified ([place])   . Other malaise and fatigue 11/21/2007  . Allergic rhinitis due to pollen 11/21/2007  . Contusion of toe 09/12/2008  . Anxiety state, unspecified 11/28/2008  . Brachial neuritis or radiculitis NOS   . Cramp of limb   . Abnormal involuntary movements(781.0)   . Acute upper respiratory infections of unspecified site   . Cervicalgia   . Conjunctivitis unspecified   . Sprain of hand, unspecified site   . Enthesopathy of unspecified site   . Pain in limb   . Essential and other specified forms of tremor   . Hip, thigh, leg, and ankle, insect bite, nonvenomous, without mention of infection(916.4)   . Chest pain, unspecified   . Sprain of neck   . Dysfunction of eustachian tube   . Costal chondritis   . Sleep disturbance, unspecified   . Contact dermatitis and other eczema due to plants (except food)   . Acute sinusitis, unspecified   . GERD (gastroesophageal reflux disease)     in past  . Seizures ([place])     age 75 - after concussion  . Concussion     age 75 - s/p accident  .  Headache     migraines - aura - no pain  . Tremors of nervous system     Parkinsonian symptoms    Past Surgical History  Procedure Laterality Date  . Other surgical history  2002    ear surgery  . Colonoscopy  2008  . Stapedes surgery Right 2002  . Cataract extraction Right 07/18/14  . Cataract extraction w/phaco Left 08/01/2014    Procedure: CATARACT EXTRACTION PHACO AND INTRAOCULAR LENS PLACEMENT (IOC);  Surgeon: Leandrew Koyanagi, MD;  Location: [facility];  Service: Ophthalmology;  Laterality: Left;  IVA TOPICAL    There were no vitals filed for this visit.  Visit Diagnosis:  Abnormality of gait  Low back pain without sciatica, unspecified back pain laterality      Subjective Assessment - 03/29/15 1024    Subjective The patient tried prone stretching at home x 2 minutes for low back. He continues with discomfort in low back that he notices daily.   Patient Stated Goals improve low back and neck discomfort, update home program   Currently in Pain? Yes   Pain Score --  "discomfort"      THERAPEUTIC EXERCISE: Prone PWR up exercises attempting prone to prone midway to elbows.  Patient has increased low back motion and does not distribute the extension along spine.  PT  modified initially to just lift arms for thoracic extension and upper back motion/strengthening x 10 reps. Supine thoracic extension over towel roll placed perpendicular to spine. Trunk rotation beginning in sidelying with reaching for upper arm and low back stretching.  Bridges x 10 reps [employer] with cues on postural/core stability "do not let hips drop" x 10 reps x 2 sets  Thomas test stretching does not elicit stretch at proximal hip, therefore moved to prone. Prone quadriceps stretching x 3 reps x 20 seconds using a belt for home independence with exercise (patient has yoga strap to perform)  Sitting posture with cues on anterior pelvic mobilty.    Supine knee to chest single and  double without back pain (although in function, bending forward provokes worsening symptoms).         PT Education - 03/30/15 0741    Education provided Yes   Education Details HEP: scapular retraction, quad stretch, PWR trunk rotation   Person(s) Educated Patient;Child(ren)   Methods Explanation;Demonstration;Handout   Comprehension Verbalized understanding;Returned demonstration          PT Short Term Goals - 03/21/15 1243    PT SHORT TERM GOAL #1   Title STGs=LTGs           PT Long Term Goals - 03/21/15 1254    PT LONG TERM GOAL #1   Title The patient will be independent with HEP for post d/c progression of activities.   Baseline Target date 04/19/2015   Time 4   Period Weeks   PT LONG TERM GOAL #2   Title The patient will improve FGA from 22/30 to > or equal to 26/30 to demo improved dynamic postural control   Baseline Target date 04/19/2015   Time 4   Period Weeks   PT LONG TERM GOAL #3   Title The patient will decrease low back from to < or equal to 4/10 (reports 7/10) at worst.   Baseline Target date 04/19/2015   Time 4   Period Weeks   PT LONG TERM GOAL #4   Title The patient will verbalize self mgmt techniques for low back and neck discomfort.   Baseline Target date 04/19/2015   Time 4   Period Weeks   PT LONG TERM GOAL #5   Title Further goals to follow on 5xsit<>stand or gait speed, as indicated               Plan - 03/30/15 0743    Clinical Impression Statement PT initiated PWR prone exercises, however increases low back pain.  In function, flexion is more uncomfortable.  During exercises in prone, he has a pivot point at low back and appears to have decreased thoracic extension noted.  Hypomobility in mid spine appears to be leading to hypermobility in lower back.  PT working through postural limitations, low back pain to help improve functional mobility.   PT Next Visit Plan Check HEP, add bridges with marching if patient tolerating current HEP  without low back pain, PERFORM 5xSIT<>STAND, GAIT SPEED.   Consulted and Agree with Plan of Care Patient        Problem List Patient Active Problem List   Diagnosis Date Noted  . Chronic fatigue 10/23/2014  . Chronic anxiety 10/23/2014  . Parkinson's disease ([place]) 09/20/2014  . Malnutrition ([place]) 09/20/2014  . Depression 07/26/2013  . Unsteady gait 07/26/2013  . Orthostatic hypotension 07/26/2013  . Anxiety 06/23/2013  . Chest pain 06/23/2013  . Tremor 05/18/2013  . Occlusion  and stenosis of carotid artery without mention of cerebral infarction 08/30/2012    [place], PT 03/30/2015, 7:46 AM  [facility] [address] [place], [place], [address] Phone: [phone]   Fax:  [phone]  Name: Joseph Arroyo MRN: [id number] Date of Birth: Mar 28, 1940

## 2015-04-03 ENCOUNTER — Ambulatory Visit: Payer: 59 | Admitting: Rehabilitative and Restorative Service Providers"

## 2015-04-05 ENCOUNTER — Ambulatory Visit: Payer: Medicare Other | Attending: Neurology | Admitting: Rehabilitative and Restorative Service Providers"

## 2015-04-05 DIAGNOSIS — R269 Unspecified abnormalities of gait and mobility: Secondary | ICD-10-CM

## 2015-04-05 DIAGNOSIS — R279 Unspecified lack of coordination: Secondary | ICD-10-CM | POA: Diagnosis present

## 2015-04-05 DIAGNOSIS — M545 Low back pain: Secondary | ICD-10-CM | POA: Diagnosis present

## 2015-04-05 DIAGNOSIS — R278 Other lack of coordination: Secondary | ICD-10-CM

## 2015-04-05 NOTE — Patient Instructions (Signed)
Balance: Single leg stance activities (tapping cones and weight shifting in standing tapping steps)  Dynamic balance: Heel walking Toe walking Backwards walking Multi-directional changes including sidestepping, quick change to backwards walking, and then PWR up walking  Core stabilization: Physioball exercises with core stabilization attempting march and hold (more challenging to lift the left leg) x 5 reps each side  Discussed chiropractor modifications to PT HEP and recommended holding PT due to increased frequency of chiropractor visits.

## 2015-04-05 NOTE — Therapy (Signed)
[facility] [address] [place], [place], [address] Phone: [phone]   Fax:  [phone]  Physical Therapy Treatment  Patient Details  Name: Joseph Arroyo MRN: [id number] Date of Birth: 10/24/1940 Referring Provider: Dr. Star Age  Encounter Date: 04/05/2015      PT End of Session - 04/05/15 1458    Visit Number 3   Number of Visits 8   Date for PT Re-Evaluation 04/19/15   Authorization Type UHC state / G code every 10th visit?   PT Start Time 1448   PT Stop Time 1530   PT Time Calculation (min) 42 min   Activity Tolerance Patient tolerated treatment well   Behavior During Therapy WFL for tasks assessed/performed      Past Medical History  Diagnosis Date  . Ulcer   . Depression   . Occlusion and stenosis of carotid artery without mention of cerebral infarction   . Lumbago   . Thrombocytopenia, unspecified ([place])   . Other malaise and fatigue 11/21/2007  . Allergic rhinitis due to pollen 11/21/2007  . Contusion of toe 09/12/2008  . Anxiety state, unspecified 11/28/2008  . Brachial neuritis or radiculitis NOS   . Cramp of limb   . Abnormal involuntary movements(781.0)   . Acute upper respiratory infections of unspecified site   . Cervicalgia   . Conjunctivitis unspecified   . Sprain of hand, unspecified site   . Enthesopathy of unspecified site   . Pain in limb   . Essential and other specified forms of tremor   . Hip, thigh, leg, and ankle, insect bite, nonvenomous, without mention of infection(916.4)   . Chest pain, unspecified   . Sprain of neck   . Dysfunction of eustachian tube   . Costal chondritis   . Sleep disturbance, unspecified   . Contact dermatitis and other eczema due to plants (except food)   . Acute sinusitis, unspecified   . GERD (gastroesophageal reflux disease)     in past  . Seizures ([place])     age 72 - after concussion  . Concussion     age 21 - s/p accident  .  Headache     migraines - aura - no pain  . Tremors of nervous system     Parkinsonian symptoms    Past Surgical History  Procedure Laterality Date  . Other surgical history  2002    ear surgery  . Colonoscopy  2008  . Stapedes surgery Right 2002  . Cataract extraction Right 07/18/14  . Cataract extraction w/phaco Left 08/01/2014    Procedure: CATARACT EXTRACTION PHACO AND INTRAOCULAR LENS PLACEMENT (IOC);  Surgeon: Leandrew Koyanagi, MD;  Location: [facility];  Service: Ophthalmology;  Laterality: Left;  IVA TOPICAL    There were no vitals filed for this visit.  Visit Diagnosis:  Abnormality of gait  Decreased coordination  Low back pain without sciatica, unspecified back pain laterality      Subjective Assessment - 04/05/15 1452    Subjective The patient reports that he is going to a chiropractor in [place] that specialices in dealing with the Axis bone.  The chiropractor told the patient that he should stop doing spinal rotation exercise provided in PT last visit.     Patient Stated Goals improve low back and neck discomfort, update home program   Currently in Pain? Yes   Pain Score 2    Pain Location Back   Pain Orientation Lower   Pain Descriptors / Indicators  Discomfort   Pain Type Chronic pain   Pain Onset More than a month ago   Pain Frequency Intermittent   Aggravating Factors  bending, stooping, riding in a car   Pain Relieving Factors rest, tylenol            [facility] PT Assessment - 04/05/15 1459    Transfers   Comments 9.84 seconds 5xsit<>stand    Gait: Dynamic gait activities including  Heel walking x 30 ft x 3 reps Toe walking x 30 ft x 3 reps Backwards walking emphasizing extension through hands as L hand curls into flexion Multi-directional changes including sidestepping, quick change to backwards walking, and then PWR up walking  Gait speed is WNLs.   NEUROMUSCULAR RE-EDUCATION: Single limb stance activities tapping cones and moving  objects while in Single limb position Tapping between 6" and 12" surfaces without UE support for single limb stance control  Physioball exercises with core stabilization attempting march and hold (more challenging to lift the left leg) x 5 reps each side  SELF CARE/HOME MANAGEMENT: Patient's wife discussed chiropractor modifications to PT HEP and recommended holding PT due to increased frequency of chiropractor visits and not wanting too many various treatment interventions being performed at the same time.         PT Short Term Goals - 03/21/15 1243    PT SHORT TERM GOAL #1   Title STGs=LTGs           PT Long Term Goals - 03/21/15 1254    PT LONG TERM GOAL #1   Title The patient will be independent with HEP for post d/c progression of activities.   Baseline Target date 04/19/2015   Time 4   Period Weeks   PT LONG TERM GOAL #2   Title The patient will improve FGA from 22/30 to > or equal to 26/30 to demo improved dynamic postural control   Baseline Target date 04/19/2015   Time 4   Period Weeks   PT LONG TERM GOAL #3   Title The patient will decrease low back from to < or equal to 4/10 (reports 7/10) at worst.   Baseline Target date 04/19/2015   Time 4   Period Weeks   PT LONG TERM GOAL #4   Title The patient will verbalize self mgmt techniques for low back and neck discomfort.   Baseline Target date 04/19/2015   Time 4   Period Weeks   PT LONG TERM GOAL #5   Title Further goals to follow on 5xsit<>stand or gait speed, as indicated               Plan - 04/05/15 1616    Clinical Impression Statement The patient, PT and patient's wife discussed his recent treatment through "[facility]" and seeing a chiropractor recommended by that service in [place] 3 days/week.  The chiropractor wanted to modify PT's treatment based on no spinal rotation and avoiding all abdominal/core stabilization exercises.  PT recommended that if activities were restricted, we hold PT at this time  until he completes 3x/week treatment with chiropractor in [place].  Will resume when patient able to attend PT more regularly and without restrictions.   PT Next Visit Plan Check HEP, update goals once patient returns from hold in PT.   Consulted and Agree with Plan of Care Patient;Family member/caregiver   Family Member Consulted spouse        Problem List Patient Active Problem List   Diagnosis Date Noted  . Chronic fatigue 10/23/2014  .  Chronic anxiety 10/23/2014  . Parkinson's disease ([place]) 09/20/2014  . Malnutrition ([place]) 09/20/2014  . Depression 07/26/2013  . Unsteady gait 07/26/2013  . Orthostatic hypotension 07/26/2013  . Anxiety 06/23/2013  . Chest pain 06/23/2013  . Tremor 05/18/2013  . Occlusion and stenosis of carotid artery without mention of cerebral infarction 08/30/2012    [place], PT 04/05/2015, 4:20 PM  [facility] [address] [place], [place], [address] Phone: [phone]   Fax:  [phone]  Name: Joseph Arroyo MRN: [id number] Date of Birth: 11-Apr-1940

## 2015-04-09 ENCOUNTER — Encounter: Payer: Self-pay | Admitting: Neurology

## 2015-04-09 ENCOUNTER — Ambulatory Visit (INDEPENDENT_AMBULATORY_CARE_PROVIDER_SITE_OTHER): Payer: Medicare PPO | Admitting: Neurology

## 2015-04-09 VITALS — BP 108/62 | HR 58 | Resp 14 | Ht 68.0 in | Wt 145.0 lb

## 2015-04-09 DIAGNOSIS — G20 Parkinson's disease: Secondary | ICD-10-CM

## 2015-04-09 DIAGNOSIS — M545 Low back pain, unspecified: Secondary | ICD-10-CM

## 2015-04-09 DIAGNOSIS — F411 Generalized anxiety disorder: Secondary | ICD-10-CM

## 2015-04-09 NOTE — Patient Instructions (Addendum)
We will taper you off of the Rytary: take 2 pills 3 times a day for one week, then 1 pill 3 times a day.   We will keep the mirapex 0.125 mg, at 2 pills 3 times a day.   We will request a second opinion at [facility].   We will see you back in 4 months.
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
## 2015-04-10 ENCOUNTER — Ambulatory Visit: Payer: 59 | Admitting: Physical Therapy

## 2015-04-12 ENCOUNTER — Ambulatory Visit: Payer: 59 | Admitting: Physical Therapy

## 2015-04-16 ENCOUNTER — Ambulatory Visit: Payer: 59 | Admitting: Physical Therapy

## 2015-05-14 ENCOUNTER — Telehealth: Payer: Self-pay | Admitting: Neurology

## 2015-05-14 NOTE — Telephone Encounter (Signed)
Pt has an appt on March 28th do see Dr. Nicki Reaper with [facility]. He is to have medical records that need to be reviewed sent to there office. Phone # [phone].  May call pt [phone], He would like to know if this has already been sent.

## 2015-05-15 NOTE — Telephone Encounter (Signed)
Called patient back and left a message stating I called Dr. Bary Leriche office and they have referral and notes for his apt. 05/28/15.

## 2015-05-16 NOTE — Telephone Encounter (Signed)
Called and spoke to patient relayed to him all records had been faxed x2. Patient understood details.

## 2015-05-16 NOTE — Telephone Encounter (Signed)
Patient returned Dana's call, would like to speak to [facility] as a follow up to message. Please call [phone].

## 2015-05-27 ENCOUNTER — Encounter: Payer: Self-pay | Admitting: Rehabilitative and Restorative Service Providers"

## 2015-05-27 ENCOUNTER — Telehealth: Payer: Self-pay | Admitting: Neurology

## 2015-05-27 NOTE — Telephone Encounter (Signed)
Patient's wife is calling and states she would like to talk with you about increasing Rx pramipexole(MIRAPES) 0.125 mg from 2 tablets 3 time per day to 3 tablets 3 times per day.  Please call [phone].  Thanks!

## 2015-05-27 NOTE — Therapy (Signed)
[facility] [address] [place], [place], [address] Phone: [phone]   Fax:  [phone]  Patient Details  Name: Joseph Arroyo MRN: [id number] Date of Birth: 1940/09/16 Referring Provider:  No ref. provider found  Encounter Date: last encounter 03/29/2015  PHYSICAL THERAPY DISCHARGE SUMMARY  Visits from Start of Care: 3 *patient requested to hold further PT to participate in chiropractic care through alternative medication evaluation he had performed in [place].  He did not return for further PT visits.  Current functional level related to goals / functional outcomes:     PT Short Term Goals - 03/21/15 1243    PT SHORT TERM GOAL #1   Title STGs=LTGs         PT Long Term Goals - 03/21/15 1254    PT LONG TERM GOAL #1   Title The patient will be independent with HEP for post d/c progression of activities.   Baseline Target date 04/19/2015   Time 4   Period Weeks   PT LONG TERM GOAL #2   Title The patient will improve FGA from 22/30 to > or equal to 26/30 to demo improved dynamic postural control   Baseline Target date 04/19/2015   Time 4   Period Weeks   PT LONG TERM GOAL #3   Title The patient will decrease low back from to < or equal to 4/10 (reports 7/10) at worst.   Baseline Target date 04/19/2015   Time 4   Period Weeks   PT LONG TERM GOAL #4   Title The patient will verbalize self mgmt techniques for low back and neck discomfort.   Baseline Target date 04/19/2015   Time 4   Period Weeks   PT LONG TERM GOAL #5   Title Further goals to follow on 5xsit<>stand or gait speed, as indicated     Not addressed due to shorter than anticipated length of stay.   Remaining deficits: See initial evaluation for patient deficits.   Education / Equipment: HEP, home stretching, gym routine.  Plan: Patient agrees to discharge.  Patient goals were not met. Patient is being discharged due to not returning since  the last visit.  ?????       Thank you for the referral of this patient. Rudell Cobb, MPT   Joseph Arroyo 05/27/2015, 9:24 AM  [facility] [address] [place], [place], [address] Phone: [phone]   Fax:  [phone]

## 2015-05-28 NOTE — Telephone Encounter (Signed)
I called to get more details but no answer. Any recommendations? Or would you like for me to get more details first?

## 2015-05-29 NOTE — Telephone Encounter (Signed)
Yes, please call back for more info: 1. How did the second opinion go? 2. Don't see any report in Care Everywhere, so don't know about the recs and we will take it from there.

## 2015-05-29 NOTE — Telephone Encounter (Signed)
Pt's wife called and says notes will be sent to the office. And he does not need the Mirapes rx right now the other physician called some in for him.

## 2015-05-29 NOTE — Telephone Encounter (Signed)
[name] is aware and will review notes when they get faxed in.

## 2015-06-18 ENCOUNTER — Telehealth: Payer: Self-pay | Admitting: Neurology

## 2015-06-18 DIAGNOSIS — G20 Parkinson's disease: Secondary | ICD-10-CM

## 2015-06-18 NOTE — Telephone Encounter (Signed)
Patient called, he spoke with [facility] that advised him to contact our office and request order for continuation of Physical Therapy.

## 2015-06-19 NOTE — Telephone Encounter (Signed)
Placed order for PT at [facility], thx

## 2015-06-19 NOTE — Telephone Encounter (Signed)
I don't see any additional records under care everywhere. Can you ask patient to have them faxed to me?

## 2015-06-19 NOTE — Telephone Encounter (Signed)
Ok to do

## 2015-06-19 NOTE — Telephone Encounter (Signed)
I let patient know that orders have been placed. He asked if you have received notes from March 28th appt with Dr. Nicki Reaper with at [facility]?

## 2015-06-20 NOTE — Telephone Encounter (Signed)
I sent medical records a fax asking for appt notes to be sent to [place].

## 2015-06-25 NOTE — Telephone Encounter (Signed)
Records have just been faxed in and I will place on your desk

## 2015-06-26 ENCOUNTER — Telehealth: Payer: Self-pay | Admitting: Neurology

## 2015-06-26 NOTE — Telephone Encounter (Signed)
Reviewed the neurology office note from [place] from 05/28/2015, at which time the patient was seen by Dr. Jennelle Human in movement disorders clinic. I appreciate Dr. Bary Leriche input. Patient was felt to have tremor predominant Parkinson's disease. He was advised to continue pramipexole 0.125 mg strength 2 tablets 3 times a day at 7, 12 and 5 PM. He was restarted on Sinemet immediate release 25-100 milligrams strength half a pill twice daily for 1 week, half a pill 3 times a day for one week, one pill 3 times a day for 1 week, 1-1/2 pills 3 times a day for 1 week, then 2 pills 3 times a day thereafter.

## 2015-07-01 ENCOUNTER — Ambulatory Visit: Payer: Medicare PPO | Admitting: Family Medicine

## 2015-07-04 ENCOUNTER — Ambulatory Visit: Payer: Medicare Other | Attending: Neurology | Admitting: Physical Therapy

## 2015-07-04 DIAGNOSIS — R293 Abnormal posture: Secondary | ICD-10-CM | POA: Diagnosis present

## 2015-07-04 DIAGNOSIS — R2689 Other abnormalities of gait and mobility: Secondary | ICD-10-CM | POA: Insufficient documentation

## 2015-07-08 ENCOUNTER — Encounter: Payer: Self-pay | Admitting: Family Medicine

## 2015-07-08 ENCOUNTER — Ambulatory Visit (INDEPENDENT_AMBULATORY_CARE_PROVIDER_SITE_OTHER): Payer: Medicare Other | Admitting: Family Medicine

## 2015-07-08 VITALS — BP 108/68 | HR 61 | Temp 98.4°F | Resp 18 | Ht 68.0 in | Wt 143.3 lb

## 2015-07-08 DIAGNOSIS — Z0000 Encounter for general adult medical examination without abnormal findings: Secondary | ICD-10-CM

## 2015-07-08 DIAGNOSIS — N400 Enlarged prostate without lower urinary tract symptoms: Secondary | ICD-10-CM

## 2015-07-08 NOTE — Therapy (Signed)
[facility] [address] [place], [place], [address] Phone: [phone]   Fax:  [phone]  Physical Therapy Evaluation  Patient Details  Name: Joseph Arroyo MRN: [id number] Date of Birth: [birth year] Referring Provider: Rexene Alberts  Encounter Date: 07/04/2015      PT End of Session - 07/08/15 0924    Visit Number 1   Number of Visits 8   Date for PT Re-Evaluation 09/02/15   Authorization Type [employer] STATE/G code every 10th visit   PT Start Time 1103   PT Stop Time 1150   PT Time Calculation (min) 47 min   Activity Tolerance Patient tolerated treatment well   Behavior During Therapy [facility] for tasks assessed/performed      Past Medical History  Diagnosis Date  . Ulcer   . Depression   . Occlusion and stenosis of carotid artery without mention of cerebral infarction   . Lumbago   . Thrombocytopenia, unspecified ([place])   . Other malaise and fatigue 11/21/2007  . Allergic rhinitis due to pollen 11/21/2007  . Contusion of toe 09/12/2008  . Anxiety state, unspecified 11/28/2008  . Brachial neuritis or radiculitis NOS   . Cramp of limb   . Abnormal involuntary movements(781.0)   . Acute upper respiratory infections of unspecified site   . Cervicalgia   . Conjunctivitis unspecified   . Sprain of hand, unspecified site   . Enthesopathy of unspecified site   . Pain in limb   . Essential and other specified forms of tremor   . Hip, thigh, leg, and ankle, insect bite, nonvenomous, without mention of infection(916.4)   . Chest pain, unspecified   . Sprain of neck   . Dysfunction of eustachian tube   . Costal chondritis   . Sleep disturbance, unspecified   . Contact dermatitis and other eczema due to plants (except food)   . Acute sinusitis, unspecified   . GERD (gastroesophageal reflux disease)     in past  . Seizures ([place])     age 46 - after concussion  . Concussion     age 81 - s/p accident  . Headache    migraines - aura - no pain  . Tremors of nervous system     Parkinsonian symptoms    Past Surgical History  Procedure Laterality Date  . Other surgical history  2002    ear surgery  . Colonoscopy  2008  . Stapedes surgery Right 2002  . Cataract extraction Right 07/18/14  . Cataract extraction w/phaco Left 08/01/2014    Procedure: CATARACT EXTRACTION PHACO AND INTRAOCULAR LENS PLACEMENT (IOC);  Surgeon: Leandrew Koyanagi, MD;  Location: [facility];  Service: Ophthalmology;  Laterality: Left;  IVA TOPICAL    There were no vitals filed for this visit.       Subjective Assessment - 07/08/15 0918    Subjective Pt comes back to physical therapy this visit, after finishing chiropractor services in [place].  He feels that helped the neck pain.  Pt reports no falls in the past 6 months.  He wants to focus on low back pain and updating Parkinson's HEP.   Patient is accompained by: Family member  wife, Joseph Arroyo   Patient Stated Goals Pt wants to return to therapy to improve low back pain and update home program.   Currently in Pain? Yes   Pain Score 5    Pain Location Back   Pain Orientation Lower   Pain Descriptors / Indicators Discomfort   Pain  Type Chronic pain   Pain Onset More than a month ago   Pain Frequency Intermittent   Aggravating Factors  driving long distance, riding in the car   Pain Relieving Factors rest, tylenol            [facility] PT Assessment - 07/08/15 0919    Assessment   Medical Diagnosis Parkinsonism   Referring Provider [place]   Precautions   Precautions Fall   Balance Screen   Has the patient fallen in the past 6 months No   Has the patient had a decrease in activity level because of a fear of falling?  No   Is the patient reluctant to leave their home because of a fear of falling?  No   Home Environment   Living Environment Private residence   Living Arrangements Spouse/significant other   Type of [place] to enter    Entrance Stairs-Number of Steps 3   Entrance Stairs-Rails Can reach both   Prior Function   Level of [place] Works out at gym several times per week, uses machines.   Posture/Postural Control   Posture/Postural Control Postural limitations   Postural Limitations Decreased lumbar lordosis   PROM   Overall PROM Comments increased stiffness noted LLE   Strength   Overall Strength Within functional limits for tasks performed   Transfers   Transfers Sit to Stand;Stand to Sit   Sit to Stand 6: Modified independent (Device/Increase time);Without upper extremity assist;From chair/3-in-1   Five time sit to stand comments  9.55   Stand to Sit 6: Modified independent (Device/Increase time);Without upper extremity assist;To chair/3-in-1   Comments One episode of posterior lean with 5x sit<>stand test   Ambulation/Gait   Ambulation/Gait Yes   Ambulation/Gait Assistance 7: Independent   Ambulation Distance (Feet) 150 Feet   Assistive device None   Gait Pattern Poor foot clearance - left;Narrow base of support;Step-through pattern   Ambulation Surface Level;Indoor   Gait velocity 7.62 sec= 4.3 ft/sec   Standardized Balance Assessment   Standardized Balance Assessment Timed Up and Go Test;Four Square Step Test   Timed Up and Go Test   Normal TUG (seconds) 10.36   Manual TUG (seconds) 9.72   Cognitive TUG (seconds) 10.16   Four Square Step Test    Trial One  14.28   High Level Balance   High Level Balance Comments Posterior push and release test:  takes no steps, takes extra time with ankle strategy to regain balance; no LOB with push and release anterior or lateral directions.  Single limb stance:  RLE:  1.66 sec, L:  2.77 sec; Tandem stance with LLE in front:  10.95 sec, with RLE in front:  <5 seconds                             PT Short Term Goals - 07/08/15 0930    PT SHORT TERM GOAL #1   Title Pt will be independent with HEP to address  balance, gait, posture, back pain.  TARGET 08/03/15   Time 4   Period Weeks   Status New   PT SHORT TERM GOAL #2   Title Pt will verbalize at least 3 means to decrease back pain.   Time 4   Period Weeks   Status New   PT SHORT TERM GOAL #3   Title Pt will improve 4-square step test to less than or  equal to 12 seconds for improved dynamic balance.   Time 4   Period Weeks   Status New   PT SHORT TERM GOAL #4   Title Pt will perform at least 8 of 10 reps of sit<>stand transfers from lower than 18" surfaces, without UE support, with no posterior lean.   Time 4   Period Weeks   Status New           PT Long Term Goals - 11-Jul-2015 0931    PT LONG TERM GOAL #1   Title Pt will improve tandem stance to at least 10 seconds, each position, for improved standing balance and stability.  TARGET 09/02/15   Time 8   Period Weeks   Status New   PT LONG TERM GOAL #2   Title Pt will improve single limb stance to at least 3 seconds each leg, for improved obstacle and stair negotiation.   Time 8   Period Weeks   Status New   PT LONG TERM GOAL #3   Title Pt will ambulate at least 1000 ft, using bilateral walking poles, modified independently, without complaints of back pain, for improved outdoor gait.   Time 8   Period Weeks   Status New   PT LONG TERM GOAL #4   Title Pt will verbalize/demo understanding of posture and positioning with ADLs to decrease back discomfort.   Time 8   Period Weeks               Plan - 07/11/15 [id number]    Clinical Impression Statement Pt is a 75 year old male, who is familiar to this therapist from previous bouts of therapy.  Recently, patient was seen for PT for several visit in Jan-March of this year, but due to pt's desire to focus on chiropractic services to address neck, PT was discharged.  Pt returns today desiring to work on updates to Parkinson's related HEP, back pain, and gait with walking poles.  Pt demonstrates postural abnormality, decreased standing  balance in single limb and tandem stance, decreased endurance for standing and gait due to back pain.  Pt is very active at gym and at his church.  He would benefit from skilled PT to address the above stated deficits to improve functional mobility and decrease risk of falls.   Rehab Potential Good   PT Frequency 1x / week   PT Duration 8 weeks   PT Treatment/Interventions ADLs/Self Care Home Management;Therapeutic exercise;Therapeutic activities;Functional mobility training;Gait training;Patient/family education;DME Instruction;Neuromuscular re-education;Balance training;Manual techniques   PT Next Visit Plan Pt to bring in HEP to assess and update as needed; work on tandem stance, single limb stance, and instruction in walking poles   Recommended Other Services Pt has OT and speech referral on file (good through end of June 2017) from PD screens, but pt would prefer to hold on scheduling those for now.   Consulted and Agree with Plan of Care Patient;Family member/caregiver   Family Member Consulted wife      Patient will benefit from skilled therapeutic intervention in order to improve the following deficits and impairments:  Abnormal gait, Decreased balance, Decreased endurance, Difficulty walking, Impaired flexibility, Postural dysfunction, Decreased activity tolerance, Decreased mobility  Visit Diagnosis: Other abnormalities of gait and mobility  Abnormal posture      G-Codes - Jul 11, 2015 0935    Functional Assessment Tool Used four square step test:  14.28 sec, single limb stance:  2.7 sec LLE, 1.66 sec RLE; tandem stance L in front:  10.95 sec, R in front <5 seconds   Functional Limitation Mobility: Walking and moving around   Mobility: Walking and Moving Around Current Status [phone]) At least 20 percent but less than 40 percent impaired, limited or restricted   Mobility: Walking and Moving Around Goal Status [phone]) At least 1 percent but less than 20 percent impaired, limited or  restricted       Problem List Patient Active Problem List   Diagnosis Date Noted  . Chronic fatigue 10/23/2014  . Chronic anxiety 10/23/2014  . Parkinson's disease ([place]) 09/20/2014  . Malnutrition ([place]) 09/20/2014  . Depression 07/26/2013  . Unsteady gait 07/26/2013  . Orthostatic hypotension 07/26/2013  . Anxiety 06/23/2013  . Chest pain 06/23/2013  . Tremor 05/18/2013  . Occlusion and stenosis of carotid artery without mention of cerebral infarction 08/30/2012    Kerrigan Gombos W. 07/08/2015, 9:36 AM  Frazier Butt., PT  [facility] [address] [place], [place], [address] Phone: [phone]   Fax:  [phone]  Name: Joseph Arroyo MRN: [id number] Date of Birth: [birth year]

## 2015-07-08 NOTE — Progress Notes (Signed)
Name: Joseph Arroyo   MRN: [id number]    DOB: 06/06/1940   Date:07/08/2015       Progress Note  Subjective  Chief Complaint  Chief Complaint  Patient presents with  . Annual Exam    HPI  Pt. Is here for a Complete Physical Exam. He is doing well. Experiencing intermittent tremors (seen by Neurologist at [facility]). Last Colonoscopy 8 years ago, records not available in EPIC.  Past Medical History  Diagnosis Date  . Ulcer   . Depression   . Occlusion and stenosis of carotid artery without mention of cerebral infarction   . Lumbago   . Thrombocytopenia, unspecified ([place])   . Other malaise and fatigue 11/21/2007  . Allergic rhinitis due to pollen 11/21/2007  . Contusion of toe 09/12/2008  . Anxiety state, unspecified 11/28/2008  . Brachial neuritis or radiculitis NOS   . Cramp of limb   . Abnormal involuntary movements(781.0)   . Acute upper respiratory infections of unspecified site   . Cervicalgia   . Conjunctivitis unspecified   . Sprain of hand, unspecified site   . Enthesopathy of unspecified site   . Pain in limb   . Essential and other specified forms of tremor   . Hip, thigh, leg, and ankle, insect bite, nonvenomous, without mention of infection(916.4)   . Chest pain, unspecified   . Sprain of neck   . Dysfunction of eustachian tube   . Costal chondritis   . Sleep disturbance, unspecified   . Contact dermatitis and other eczema due to plants (except food)   . Acute sinusitis, unspecified   . GERD (gastroesophageal reflux disease)     in past  . Seizures ([place])     age 56 - after concussion  . Concussion     age 60 - s/p accident  . Headache     migraines - aura - no pain  . Tremors of nervous system     Parkinsonian symptoms    Past Surgical History  Procedure Laterality Date  . Other surgical history  2002    ear surgery  . Colonoscopy  2008  . Stapedes surgery Right 2002  . Cataract extraction Right 07/18/14  . Cataract extraction w/phaco Left  08/01/2014    Procedure: CATARACT EXTRACTION PHACO AND INTRAOCULAR LENS PLACEMENT (IOC);  Surgeon: Leandrew Koyanagi, MD;  Location: [facility];  Service: Ophthalmology;  Laterality: Left;  IVA TOPICAL    Family History  Problem Relation Age of Onset  . Heart failure Mother     Social History   Social History  . Marital Status: Married    Spouse Name: [name]   . Number of Children: 2  . Years of Education: 12   Occupational History  .    [employer] Retired     Social History Main Topics  . Smoking status: Never Smoker   . Smokeless tobacco: Never Used  . Alcohol Use: No  . Drug Use: No  . Sexual Activity: Not on file   Other Topics Concern  . Not on file   Social History Narrative   Married to [name], has 2 children   Right handed   Master's plus    2 cups daily     Current outpatient prescriptions:  .  ALPRAZolam (XANAX) 0.25 MG tablet, Take 0.25 mg by mouth 3 (three) times daily. , Disp: , Rfl:  .  B Complex Vitamins (VITAMIN B COMPLEX PO), Take 1 tablet by mouth daily., Disp: , Rfl:  .  Capsicum-Garlic XX123456 MG CAPS, Take 200-300 mg by mouth., Disp: , Rfl:  .  Carbidopa-Levodopa ER (RYTARY) 23.75-95 MG CPCR, Take 95 mg by mouth 3 (three) times daily. Take 1 pill tid (8AM, 12, 4PM) x 1 week, then 2 pills tid x 1 week, then, 3 pills tid thereafter. (Patient not taking: Reported on 07/08/2015), Disp: 810 capsule, Rfl: 1 .  Coenzyme Q10 (COQ-10) 100 MG CAPS, Take by mouth daily., Disp: , Rfl:  .  fludrocortisone (FLORINEF) 0.1 MG tablet, Take 1 tablet (100 mcg total) by mouth daily. (Patient taking differently: Take 100 mcg by mouth every other day. ), Disp: 90 tablet, Rfl: 3 .  gabapentin (NEURONTIN) 100 MG capsule, TAKE ONE CAPSULE BY MOUTH 4 TIMES A DAY, Disp: , Rfl: 1 .  Ginkgo Biloba (GNP GINGKO BILOBA EXTRACT PO), Take 120 mg by mouth., Disp: , Rfl:  .  HAWTHORNE BERRY PO, Take by mouth daily., Disp: , Rfl:  .  ibuprofen (ADVIL,MOTRIN) 200 MG tablet, Take 200 mg by  mouth every 6 (six) hours as needed for moderate pain., Disp: , Rfl:  .  L-Methylfolate-Algae (DEPLIN 15 PO), Take 15 mg by mouth. , Disp: , Rfl:  .  Lutein 10 MG TABS, Take 1 tablet by mouth 3 (three) times daily., Disp: , Rfl:  .  Multiple Vitamins-Minerals (MULTIVITAMIN WITH MINERALS) tablet, Take 1 tablet by mouth daily. Reported on 02/27/2015, Disp: , Rfl:  .  PARoxetine (PAXIL) 20 MG tablet, Take 20 mg by mouth daily., Disp: , Rfl: 0 .  PARoxetine (PAXIL) 40 MG tablet, , Disp: , Rfl:  .  POTASSIUM PO, Take by mouth daily., Disp: , Rfl:  .  pramipexole (MIRAPEX) 0.125 MG tablet, 3 pills in AM, 2 at midday and 3 at night (Patient taking differently: 2 pills in AM and 2 at night), Disp: 720 tablet, Rfl: 0 .  Saw Palmetto, Serenoa repens, 1000 MG CAPS, Take 2 capsules by mouth daily., Disp: , Rfl:  .  Vitamin D, Ergocalciferol, (DRISDOL) 50000 UNITS CAPS capsule, Take 1 capsule (50,000 Units total) by mouth every 7 (seven) days., Disp: 30 capsule, Rfl: 3  Allergies  Allergen Reactions  . Prednisone     Hyperactivity   . Wheat Bran Diarrhea     Review of Systems  Constitutional: Negative for fever, chills, weight loss and malaise/fatigue.  HENT: Negative for congestion and sore throat.   Eyes: Negative for blurred vision and double vision.  Respiratory: Positive for cough (occasional coughing, especially with eating.). Negative for shortness of breath.   Cardiovascular: Negative for chest pain and palpitations.  Gastrointestinal: Negative for heartburn, nausea, vomiting, abdominal pain, constipation and blood in stool.  Genitourinary: Negative for dysuria, frequency and hematuria.  Musculoskeletal: Positive for back pain (chronic low back pain). Negative for joint pain and neck pain.  Skin: Negative for rash.  Neurological: Positive for tremors. Negative for dizziness, tingling, seizures and headaches.  Psychiatric/Behavioral: Positive for depression. The patient is not  nervous/anxious and does not have insomnia.     Objective  Filed Vitals:   07/08/15 1418  BP: 108/68  Pulse: 61  Temp: 98.4 F (36.9 C)  Resp: 18  Height: 5\' 8"  (1.727 m)  Weight: 143 lb 5 oz (65.006 kg)  SpO2: 97%    Physical Exam  Constitutional: He is oriented to person, place, and time and well-developed, well-nourished, and in no distress.  HENT:  Head: Normocephalic and atraumatic.  Eyes: Pupils are equal, round, and reactive to light.  Cardiovascular: Normal rate and regular rhythm.   Pulmonary/Chest: Effort normal and breath sounds normal.  Abdominal: Soft. Bowel sounds are normal.  Genitourinary: Rectum normal, testes/scrotum normal and penis normal. Prostate is enlarged (moderatley enlarged, no tenderness).  Musculoskeletal: He exhibits no edema.  Neurological: He is alert and oriented to person, place, and time.  Skin: Skin is warm and dry.  Psychiatric: Mood, memory, affect and judgment normal.  Nursing note and vitals reviewed.     Assessment & Plan  1. Annual physical exam Age appropriate laboratory studies obtained. - Lipid Profile - Comprehensive Metabolic Panel (CMET) - TSH - Vitamin D (25 hydroxy) - PSA  2. BPH without obstruction/lower urinary tract symptoms Enlarged prostate on exam but patient is asymptomatic. Recommend follow-up with urology and will make his appointment.   Trapper Meech [facility] 07/08/2015 2:41 PM

## 2015-07-09 ENCOUNTER — Ambulatory Visit: Payer: Medicare Other | Admitting: Physical Therapy

## 2015-07-09 DIAGNOSIS — R2689 Other abnormalities of gait and mobility: Secondary | ICD-10-CM | POA: Diagnosis not present

## 2015-07-09 DIAGNOSIS — R293 Abnormal posture: Secondary | ICD-10-CM

## 2015-07-10 NOTE — Therapy (Signed)
[facility] [address] [place], [place], [address] Phone: [phone]   Fax:  [phone]  Physical Therapy Treatment  Patient Details  Name: Joseph Arroyo MRN: [id number] Date of Birth: June 23, 1940 Referring Provider: Rexene Alberts  Encounter Date: 07/09/2015      PT End of Session - 07/10/15 0926    Visit Number 2   Number of Visits 8   Date for PT Re-Evaluation 09/02/15   Authorization Type U[employer] STATE/G code every 10th visit   PT Start Time 0848   PT Stop Time 0928   PT Time Calculation (min) 40 min   Activity Tolerance Patient tolerated treatment well   Behavior During Therapy [facility] for tasks assessed/performed      Past Medical History  Diagnosis Date  . Ulcer   . Depression   . Occlusion and stenosis of carotid artery without mention of cerebral infarction   . Lumbago   . Thrombocytopenia, unspecified ([place])   . Other malaise and fatigue 11/21/2007  . Allergic rhinitis due to pollen 11/21/2007  . Contusion of toe 09/12/2008  . Anxiety state, unspecified 11/28/2008  . Brachial neuritis or radiculitis NOS   . Cramp of limb   . Abnormal involuntary movements(781.0)   . Acute upper respiratory infections of unspecified site   . Cervicalgia   . Conjunctivitis unspecified   . Sprain of hand, unspecified site   . Enthesopathy of unspecified site   . Pain in limb   . Essential and other specified forms of tremor   . Hip, thigh, leg, and ankle, insect bite, nonvenomous, without mention of infection(916.4)   . Chest pain, unspecified   . Sprain of neck   . Dysfunction of eustachian tube   . Costal chondritis   . Sleep disturbance, unspecified   . Contact dermatitis and other eczema due to plants (except food)   . Acute sinusitis, unspecified   . GERD (gastroesophageal reflux disease)     in past  . Seizures ([place])     age 50 - after concussion  . Concussion     age 25 - s/p accident  . Headache    migraines - aura - no pain  . Tremors of nervous system     Parkinsonian symptoms    Past Surgical History  Procedure Laterality Date  . Other surgical history  2002    ear surgery  . Colonoscopy  2008  . Stapedes surgery Right 2002  . Cataract extraction Right 07/18/14  . Cataract extraction w/phaco Left 08/01/2014    Procedure: CATARACT EXTRACTION PHACO AND INTRAOCULAR LENS PLACEMENT (IOC);  Surgeon: Leandrew Koyanagi, MD;  Location: [facility];  Service: Ophthalmology;  Laterality: Left;  IVA TOPICAL    There were no vitals filed for this visit.      Subjective Assessment - 07/09/15 0934    Subjective Had a physical yesterday, and blood work to be done later this week.  No new complaints.   Patient Stated Goals Pt wants to return to therapy to improve low back pain and update home program.   Currently in Pain? Yes   Pain Score 3    Pain Location Back   Pain Orientation Lower   Pain Descriptors / Indicators Discomfort   Pain Type Chronic pain   Pain Onset More than a month ago   Pain Frequency Constant   Aggravating Factors  sitting too long in the car aggravates   Pain Relieving Factors lying down with pillow under knees  alleviates                  Therapeutic Exercise: Pt brings in his folder with PT, OT, and trainer exercises dating back to 2015 PT visits.  PT looks through folder and discuss the purpose and benefit of several different sets of exercises.  Disccussed keeping the following as PT exercises in HEP: -PWR! Moves in sit and stand positions (Stop doing the PWR! Twist in sidelying/prone position) -Low back stretches-see below for which performed -Forward step ups x 10 reps each side-cues for hand placement to avoid lateral lean to the side which is stepping up.        [facility] Adult PT Treatment/Exercise - 07/09/15 0949    Exercises   Exercises Lumbar   Lumbar Exercises: Stretches   Active Hamstring Stretch 30 seconds;3 reps  in sitting  position   Active Hamstring Stretch Limitations Cues for upright posture and forward lean for gentle stretch   Single Knee to Chest Stretch 30 seconds;2 reps  each leg   Double Knee to Chest Stretch 30 seconds;2 reps   Lower Trunk Rotation 3 reps;20 seconds  then 8 reps rocking side to side   Pelvic Tilt 5 reps  3 seconds into posterior pelvic tilt supine      Discussed neutral back position, and due to DDD (per wife report), discussed finding comfortable positions that create joint space (flexion movements) and avoiding over arching (extension movements) low back.  -Pt performs quadruped alternating UE reaches x 5 reps, with cues for widened BOS and abdominal activation to avoid trunk sway -Pt performs alternating lower extremity reaches x 5 reps, with cues for fully extending arms and avoiding trunk sway.          PT Education - 07/10/15 0925    Education provided Yes   Education Details Initialed and dated HEP exercises to continue going forward:  low back flexibility and stretches, single limb step ups, continue PWR! Moves (avoiding excess twisting and over-arching back)   Person(s) Educated Patient;Spouse   Methods Explanation;Demonstration;Handout   Comprehension Verbalized understanding;Returned demonstration          PT Short Term Goals - 07/08/15 0930    PT SHORT TERM GOAL #1   Title Pt will be independent with HEP to address balance, gait, posture, back pain.  TARGET 08/03/15   Time 4   Period Weeks   Status New   PT SHORT TERM GOAL #2   Title Pt will verbalize at least 3 means to decrease back pain.   Time 4   Period Weeks   Status New   PT SHORT TERM GOAL #3   Title Pt will improve 4-square step test to less than or equal to 12 seconds for improved dynamic balance.   Time 4   Period Weeks   Status New   PT SHORT TERM GOAL #4   Title Pt will perform at least 8 of 10 reps of sit<>stand transfers from lower than 18" surfaces, without UE support, with no  posterior lean.   Time 4   Period Weeks   Status New           PT Long Term Goals - 07/08/15 0931    PT LONG TERM GOAL #1   Title Pt will improve tandem stance to at least 10 seconds, each position, for improved standing balance and stability.  TARGET 09/02/15   Time 8   Period Weeks   Status New   PT LONG TERM  GOAL #2   Title Pt will improve single limb stance to at least 3 seconds each leg, for improved obstacle and stair negotiation.   Time 8   Period Weeks   Status New   PT LONG TERM GOAL #3   Title Pt will ambulate at least 1000 ft, using bilateral walking poles, modified independently, without complaints of back pain, for improved outdoor gait.   Time 8   Period Weeks   Status New   PT LONG TERM GOAL #4   Title Pt will verbalize/demo understanding of posture and positioning with ADLs to decrease back discomfort.   Time 8   Period Weeks               Plan - 07/10/15 [id number]    Clinical Impression Statement Treatment session focused on assessing/updating HEP this visit.  Worked on lumbar flexibility and single limb step ups this visit to continue going forward.  Trialed lumbar stability exercises in quadruped, which may need to be added to HEP at some point.  Also verbally discussed continuing PWR! Moves in sitting and standing, avoiding excess arching of back or twisting.  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  Will continue to benefit from further skilled PT to address balance, gait and posture for improved functional mobility.   Rehab Potential Good   PT Frequency 1x / week   PT Duration 8 weeks   PT Treatment/Interventions ADLs/Self Care Home Management;Therapeutic exercise;Therapeutic activities;Functional mobility training;Gait training;Patient/family education;DME Instruction;Neuromuscular re-education;Balance training;Manual techniques   PT Next Visit Plan Assess PWR! Moves in sitting and  standing; assess any other parts of current HEP; exercise chart for prioritizing exercises;  work on tandem stance, single limb stance, and instruction in walking poles   Consulted and Agree with Plan of Care Patient;Family member/caregiver   Family Member Consulted wife      Patient will benefit from skilled therapeutic intervention in order to improve the following deficits and impairments:  Abnormal gait, Decreased balance, Decreased endurance, Difficulty walking, Impaired flexibility, Postural dysfunction, Decreased activity tolerance, Decreased mobility  Visit Diagnosis: Other abnormalities of gait and mobility  Abnormal posture     Problem List Patient Active Problem List   Diagnosis Date Noted  . Annual physical exam 07/08/2015  . BPH without obstruction/lower urinary tract symptoms 07/08/2015  . Chronic fatigue 10/23/2014  . Chronic anxiety 10/23/2014  . Parkinson's disease ([place]) 09/20/2014  . Malnutrition ([place]) 09/20/2014  . Depression 07/26/2013  . Unsteady gait 07/26/2013  . Orthostatic hypotension 07/26/2013  . Anxiety 06/23/2013  . Chest pain 06/23/2013  . Tremor 05/18/2013  . Occlusion and stenosis of carotid artery without mention of cerebral infarction 08/30/2012    Krithika Tome W. 07/10/2015, 9:31 AM  Frazier Butt., PT [facility] [address] [place], [place], [address] Phone: [phone]   Fax:  [phone]  Name: Joseph Arroyo MRN: [id number] Date of Birth: [birth year]

## 2015-07-11 LAB — LIPID PANEL
Chol/HDL Ratio: 2.1 ratio units (ref 0.0–5.0)
Cholesterol, Total: 123 mg/dL (ref 100–199)
HDL: 60 mg/dL (ref >39)
LDL CALC: 54 mg/dL (ref 0–99)
TRIGLYCERIDES: 45 mg/dL (ref 0–149)
VLDL Cholesterol Cal: 9 mg/dL (ref 5–40)

## 2015-07-11 LAB — VITAMIN D 25 HYDROXY (VIT D DEFICIENCY, FRACTURES): VIT D 25 HYDROXY: 28.3 ng/mL — AB (ref 30.0–100.0)

## 2015-07-11 LAB — COMPREHENSIVE METABOLIC PANEL
A/G RATIO: 1.6 (ref 1.2–2.2)
ALT: 9 IU/L (ref 0–44)
AST: 22 IU/L (ref 0–40)
Albumin: 4.2 g/dL (ref 3.5–4.8)
Alkaline Phosphatase: 57 IU/L (ref 39–117)
BUN/Creatinine Ratio: 15 (ref 10–24)
BUN: 13 mg/dL (ref 8–27)
Bilirubin Total: 0.5 mg/dL (ref 0.0–1.2)
CHLORIDE: 102 mmol/L (ref 96–106)
CO2: 25 mmol/L (ref 18–29)
Calcium: 9.1 mg/dL (ref 8.6–10.2)
Creatinine, Ser: 0.88 mg/dL (ref 0.76–1.27)
GFR, EST AFRICAN AMERICAN: 99 mL/min/{1.73_m2} (ref >59)
GFR, EST NON AFRICAN AMERICAN: 85 mL/min/{1.73_m2} (ref >59)
GLOBULIN, TOTAL: 2.6 g/dL (ref 1.5–4.5)
Glucose: 94 mg/dL (ref 65–99)
POTASSIUM: 4.2 mmol/L (ref 3.5–5.2)
SODIUM: 143 mmol/L (ref 134–144)
TOTAL PROTEIN: 6.8 g/dL (ref 6.0–8.5)

## 2015-07-11 LAB — TSH: TSH: 2.4 u[IU]/mL (ref 0.450–4.500)

## 2015-07-11 LAB — PSA: Prostate Specific Ag, Serum: 1.5 ng/mL (ref 0.0–4.0)

## 2015-07-15 ENCOUNTER — Telehealth: Payer: Self-pay | Admitting: Family Medicine

## 2015-07-15 NOTE — Telephone Encounter (Signed)
Pt would like his lab results. He would also like a copy of those results.

## 2015-07-15 NOTE — Telephone Encounter (Signed)
Spoke with pt concerning results and mailed a copy per pt request

## 2015-07-17 ENCOUNTER — Ambulatory Visit: Payer: Medicare Other | Admitting: Physical Therapy

## 2015-07-17 ENCOUNTER — Telehealth: Payer: Self-pay | Admitting: Family Medicine

## 2015-07-17 ENCOUNTER — Encounter: Payer: Medicare PPO | Admitting: Family Medicine

## 2015-07-17 NOTE — Telephone Encounter (Signed)
Left pt a vm with instructions to take what he was prescribed by [facility]

## 2015-07-17 NOTE — Telephone Encounter (Signed)
The ones prescribed by Dr. Rutherford Nail would be fine

## 2015-07-17 NOTE — Telephone Encounter (Signed)
Patient was told by Charisse March that Dr. Manuella Ghazi was going to prescribe Vitamin D2. He states that he has refills on his Vitamin D 50,000 that was prescribed by Dr Rutherford Nail. Would like to know is it okay to just refill the Vitamin D 50,000 or if Dr Manuella Ghazi is going to prescribe something stronger? Please return call

## 2015-07-19 ENCOUNTER — Ambulatory Visit: Payer: Medicare Other | Admitting: Physical Therapy

## 2015-07-19 DIAGNOSIS — R2689 Other abnormalities of gait and mobility: Secondary | ICD-10-CM | POA: Diagnosis not present

## 2015-07-19 DIAGNOSIS — R293 Abnormal posture: Secondary | ICD-10-CM

## 2015-07-19 NOTE — Therapy (Signed)
[facility] [address] [place], [place], [address] Phone: [phone]   Fax:  [phone]  Physical Therapy Treatment  Patient Details  Name: Joseph Arroyo MRN: [id number] Date of Birth: 1940/05/04 Referring Provider: Rexene Alberts  Encounter Date: 07/19/2015      PT End of Session - 07/19/15 1205    Visit Number 3   Number of Visits 8   Date for PT Re-Evaluation 09/02/15   Authorization Type UHC STATE/G code every 10th visit   PT Start Time 1103   PT Stop Time 1152   PT Time Calculation (min) 49 min   Activity Tolerance Patient tolerated treatment well   Behavior During Therapy [facility] for tasks assessed/performed      Past Medical History  Diagnosis Date  . Ulcer   . Depression   . Occlusion and stenosis of carotid artery without mention of cerebral infarction   . Lumbago   . Thrombocytopenia, unspecified ([place])   . Other malaise and fatigue 11/21/2007  . Allergic rhinitis due to pollen 11/21/2007  . Contusion of toe 09/12/2008  . Anxiety state, unspecified 11/28/2008  . Brachial neuritis or radiculitis NOS   . Cramp of limb   . Abnormal involuntary movements(781.0)   . Acute upper respiratory infections of unspecified site   . Cervicalgia   . Conjunctivitis unspecified   . Sprain of hand, unspecified site   . Enthesopathy of unspecified site   . Pain in limb   . Essential and other specified forms of tremor   . Hip, thigh, leg, and ankle, insect bite, nonvenomous, without mention of infection(916.4)   . Chest pain, unspecified   . Sprain of neck   . Dysfunction of eustachian tube   . Costal chondritis   . Sleep disturbance, unspecified   . Contact dermatitis and other eczema due to plants (except food)   . Acute sinusitis, unspecified   . GERD (gastroesophageal reflux disease)     in past  . Seizures ([place])     age 40 - after concussion  . Concussion     age 6 - s/p accident  . Headache    migraines - aura - no pain  . Tremors of nervous system     Parkinsonian symptoms    Past Surgical History  Procedure Laterality Date  . Other surgical history  2002    ear surgery  . Colonoscopy  2008  . Stapedes surgery Right 2002  . Cataract extraction Right 07/18/14  . Cataract extraction w/phaco Left 08/01/2014    Procedure: CATARACT EXTRACTION PHACO AND INTRAOCULAR LENS PLACEMENT (IOC);  Surgeon: Leandrew Koyanagi, MD;  Location: [facility];  Service: Ophthalmology;  Laterality: Left;  IVA TOPICAL    There were no vitals filed for this visit.      Subjective Assessment - 07/19/15 1104    Subjective Daughter, Joseph Arroyo, present today at PT session.   Patient is accompained by: Family member  Daughter, Joseph Arroyo   Patient Stated Goals Pt wants to return to therapy to improve low back pain and update home program.   Currently in Pain? Yes   Pain Score 4    Pain Location Back   Pain Orientation Lower   Pain Descriptors / Indicators Discomfort   Pain Type Chronic pain   Pain Onset More than a month ago   Pain Frequency Constant   Aggravating Factors  sitting in the car too long aggravates pain   Pain Relieving Factors lying down with pillow,  support behind back                          [facility] Adult PT Treatment/Exercise - 07/19/15 0001    Ambulation/Gait   Ambulation/Gait Yes   Ambulation/Gait Assistance 5: Supervision   Ambulation/Gait Assistance Details Therapist assistance required for sequencing reciprocal poles   Ambulation Distance (Feet) 450 Feet   Assistive device None  bilateral walking poles   Gait Pattern --  difficulty sequencing poles   Ambulation Surface Level;Indoor   Gait Comments Answered pt's questions regarding benefits of walking poles as part of HEP.     Encouraged patient to bring in his own walking poles next visit.       PWR [facility]) - 07/19/15 1154    PWR! exercises Moves in sitting;Moves in standing   PWR! Up x 20   PWR!  Rock x 20   Cues provided for weightshifting and reaching   PWR Step x 20   Comments Pt reports he hasnt done the rocking recently.  Opts not to do twist due to low back pain.   PWR! Up x 20   PWR! Rock x 20  cues for widened BOS   PWR! Twist x 3  cues for weight shift, pivot with foot   PWR! Step x 20   Comments Pt opts not due to twist due to low back pain.  Cues for PWR! Step for widened BOS and for reset posture in upright standing.      Pt performs sit<>stand then PWR! Up x 20 reps.  Pt requires occasional specific cues for improved technique and performance of HEP.        PT Education - 07/19/15 1204    Education provided Yes   Education Details Instruction in walking pole sequence; benefit of walking poles   Person(s) Educated Patient;Child(ren)   Methods Explanation;Demonstration   Comprehension Verbalized understanding;Returned demonstration          PT Short Term Goals - 07/08/15 0930    PT SHORT TERM GOAL #1   Title Pt will be independent with HEP to address balance, gait, posture, back pain.  TARGET 08/03/15   Time 4   Period Weeks   Status New   PT SHORT TERM GOAL #2   Title Pt will verbalize at least 3 means to decrease back pain.   Time 4   Period Weeks   Status New   PT SHORT TERM GOAL #3   Title Pt will improve 4-square step test to less than or equal to 12 seconds for improved dynamic balance.   Time 4   Period Weeks   Status New   PT SHORT TERM GOAL #4   Title Pt will perform at least 8 of 10 reps of sit<>stand transfers from lower than 18" surfaces, without UE support, with no posterior lean.   Time 4   Period Weeks   Status New           PT Long Term Goals - 07/08/15 0931    PT LONG TERM GOAL #1   Title Pt will improve tandem stance to at least 10 seconds, each position, for improved standing balance and stability.  TARGET 09/02/15   Time 8   Period Weeks   Status New   PT LONG TERM GOAL #2   Title Pt will improve single limb stance to  at least 3 seconds each leg, for improved obstacle and stair negotiation.   Time 8  Period Weeks   Status New   PT LONG TERM GOAL #3   Title Pt will ambulate at least 1000 ft, using bilateral walking poles, modified independently, without complaints of back pain, for improved outdoor gait.   Time 8   Period Weeks   Status New   PT LONG TERM GOAL #4   Title Pt will verbalize/demo understanding of posture and positioning with ADLs to decrease back discomfort.   Time 8   Period Weeks               Plan - 07/19/15 1205    Clinical Impression Statement Treatment session focused on dedicated/specific review of PWR! Moves exercises in sitting and standing.  Pt is performing some of these exercises from previous bout of therapy, but benefits from review of these exercises.  He needs cues for technique and he prefers to avoid PWR! Twist due to low back discomfort.  Pt will continue to benefit from further skilled PT to address balance, gait and posture for improved funcitonal mobility.   Rehab Potential Good   PT Frequency 1x / week   PT Duration 8 weeks   PT Treatment/Interventions ADLs/Self Care Home Management;Therapeutic exercise;Therapeutic activities;Functional mobility training;Gait training;Patient/family education;DME Instruction;Neuromuscular re-education;Balance training;Manual techniques   PT Next Visit Plan  exercise chart for prioritizing exercises;  work on tandem stance, single limb stance, and instruction in walking poles on outdoor surfaces   Consulted and Agree with Plan of Care Patient;Family member/caregiver   Family Member Consulted wife      Patient will benefit from skilled therapeutic intervention in order to improve the following deficits and impairments:  Abnormal gait, Decreased balance, Decreased endurance, Difficulty walking, Impaired flexibility, Postural dysfunction, Decreased activity tolerance, Decreased mobility  Visit Diagnosis: Other abnormalities of  gait and mobility  Abnormal posture     Problem List Patient Active Problem List   Diagnosis Date Noted  . Annual physical exam 07/08/2015  . BPH without obstruction/lower urinary tract symptoms 07/08/2015  . Chronic fatigue 10/23/2014  . Chronic anxiety 10/23/2014  . Parkinson's disease ([place]) 09/20/2014  . Malnutrition ([place]) 09/20/2014  . Depression 07/26/2013  . Unsteady gait 07/26/2013  . Orthostatic hypotension 07/26/2013  . Anxiety 06/23/2013  . Chest pain 06/23/2013  . Tremor 05/18/2013  . Occlusion and stenosis of carotid artery without mention of cerebral infarction 08/30/2012    MARRIOTT,AMY W. 07/19/2015, 12:09 PM  Frazier Butt., PT  [facility] [address] [place], [place], [address] Phone: [phone]   Fax:  [phone]  Name: Joseph Arroyo MRN: [id number] Date of Birth: 12-06-1940

## 2015-07-23 ENCOUNTER — Ambulatory Visit: Payer: Medicare Other | Admitting: Physical Therapy

## 2015-07-23 DIAGNOSIS — R2689 Other abnormalities of gait and mobility: Secondary | ICD-10-CM | POA: Diagnosis not present

## 2015-07-23 NOTE — Therapy (Signed)
[facility] [address] [place], [place], [address] Phone: [phone]   Fax:  [phone]  Physical Therapy Treatment  Patient Details  Name: Joseph Arroyo MRN: [id number] Date of Birth: [birth year] Referring Provider: Rexene Alberts  Encounter Date: 07/23/2015      PT End of Session - 07/23/15 1112    Visit Number 4   Number of Visits 8   Date for PT Re-Evaluation 09/02/15   Authorization Type [employer] STATE/G code every 10th visit   PT Start Time 1018   PT Stop Time 1100   PT Time Calculation (min) 42 min   Activity Tolerance Patient tolerated treatment well   Behavior During Therapy [facility] for tasks assessed/performed      Past Medical History  Diagnosis Date  . Ulcer   . Depression   . Occlusion and stenosis of carotid artery without mention of cerebral infarction   . Lumbago   . Thrombocytopenia, unspecified ([place])   . Other malaise and fatigue 11/21/2007  . Allergic rhinitis due to pollen 11/21/2007  . Contusion of toe 09/12/2008  . Anxiety state, unspecified 11/28/2008  . Brachial neuritis or radiculitis NOS   . Cramp of limb   . Abnormal involuntary movements(781.0)   . Acute upper respiratory infections of unspecified site   . Cervicalgia   . Conjunctivitis unspecified   . Sprain of hand, unspecified site   . Enthesopathy of unspecified site   . Pain in limb   . Essential and other specified forms of tremor   . Hip, thigh, leg, and ankle, insect bite, nonvenomous, without mention of infection(916.4)   . Chest pain, unspecified   . Sprain of neck   . Dysfunction of eustachian tube   . Costal chondritis   . Sleep disturbance, unspecified   . Contact dermatitis and other eczema due to plants (except food)   . Acute sinusitis, unspecified   . GERD (gastroesophageal reflux disease)     in past  . Seizures ([place])     age 48 - after concussion  . Concussion     age 27 - s/p accident  . Headache    migraines - aura - no pain  . Tremors of nervous system     Parkinsonian symptoms    Past Surgical History  Procedure Laterality Date  . Other surgical history  2002    ear surgery  . Colonoscopy  2008  . Stapedes surgery Right 2002  . Cataract extraction Right 07/18/14  . Cataract extraction w/phaco Left 08/01/2014    Procedure: CATARACT EXTRACTION PHACO AND INTRAOCULAR LENS PLACEMENT (IOC);  Surgeon: Leandrew Koyanagi, MD;  Location: [facility];  Service: Ophthalmology;  Laterality: Left;  IVA TOPICAL    There were no vitals filed for this visit.      Subjective Assessment - 07/23/15 1022    Subjective No changes over the weekend.   Patient Stated Goals Pt wants to return to therapy to improve low back pain and update home program.   Currently in Pain? Yes   Pain Score 4    Pain Location Back   Pain Orientation Lower   Pain Descriptors / Indicators Discomfort   Pain Type Chronic pain   Pain Onset More than a month ago   Pain Frequency Constant   Aggravating Factors  sitting in the car too long aggravates pain   Pain Relieving Factors lying down with pillow, support behind back      No c/o increase in back  pain with exercises during therapy session.                   [facility] Adult PT Treatment/Exercise - 07/23/15 0001    Ambulation/Gait   Ambulation/Gait Yes   Ambulation/Gait Assistance 5: Supervision;4: Min assist  min assist for pole sequence   Ambulation Distance (Feet) 650 Feet   Assistive device Other (Comment)  Bilateral walking poles   Gait Pattern Step-through pattern  difficulty sequencing reciprocal pattern with walking poles   Ambulation Surface Level;Indoor  [place] to go outdoors due to rain   Gait Comments Pt brought in his own walking poles and PT adjusted them to appropriate height.  Pt responds best to sequencing poles with cues for increased step length and for deliberate L hand placement.           [facility]) - 07/23/15  1023    PWR! Up x 20   PWR! Rock x 20  cues to slow pace and hold in position   PWR! Twist x 4   PWR Step x 20   Comments Additional 10 reps of rocking and lifting for improved SLS with UE support   PWR! Up x 20   PWR! Rock x 20   PWR! Step x 20          Balance Exercises - 07/23/15 1053    Balance Exercises: Standing   Tandem Stance Eyes open;Intermittent upper extremity support;3 reps;15 secs  Cues for glut, abdominal activation and visual target   SLS Eyes open;Solid surface;Upper extremity support 1;3 reps;10 secs  Cues for abdominal and glut activation, use of visual target   Other Standing Exercises Multiple reps attempted for SLS, with tactile cues at gluts and shoulders to avoid L hip depression and increased L lateral lean when standing on LLE           PT Education - 07/23/15 1111    Education provided Yes   Education Details Review of PWR! Moves sitting and standing worked on last visit (cues for technique); review of walking pole instruction/practice   Person(s) Educated Patient;Spouse   Methods Explanation;Demonstration   Comprehension Verbalized understanding;Returned demonstration;Verbal cues required          PT Short Term Goals - 07/08/15 0930    PT SHORT TERM GOAL #1   Title Pt will be independent with HEP to address balance, gait, posture, back pain.  TARGET 08/03/15   Time 4   Period Weeks   Status New   PT SHORT TERM GOAL #2   Title Pt will verbalize at least 3 means to decrease back pain.   Time 4   Period Weeks   Status New   PT SHORT TERM GOAL #3   Title Pt will improve 4-square step test to less than or equal to 12 seconds for improved dynamic balance.   Time 4   Period Weeks   Status New   PT SHORT TERM GOAL #4   Title Pt will perform at least 8 of 10 reps of sit<>stand transfers from lower than 18" surfaces, without UE support, with no posterior lean.   Time 4   Period Weeks   Status New           PT Long Term Goals - 07/08/15  0931    PT LONG TERM GOAL #1   Title Pt will improve tandem stance to at least 10 seconds, each position, for improved standing balance and stability.  TARGET 09/02/15   Time 8  Period Weeks   Status New   PT LONG TERM GOAL #2   Title Pt will improve single limb stance to at least 3 seconds each leg, for improved obstacle and stair negotiation.   Time 8   Period Weeks   Status New   PT LONG TERM GOAL #3   Title Pt will ambulate at least 1000 ft, using bilateral walking poles, modified independently, without complaints of back pain, for improved outdoor gait.   Time 8   Period Weeks   Status New   PT LONG TERM GOAL #4   Title Pt will verbalize/demo understanding of posture and positioning with ADLs to decrease back discomfort.   Time 8   Period Weeks               Plan - 07/23/15 1112    Clinical Impression Statement Treatment session focused on review of PWR! Moves gone over last session-pt requires cues for technique with rocking and weightshifting.  Worked on progression of rocking and weightshifting in standing with pt requiring UE support to lift foot for improved single limb stance work and cues for slowed pace and hold of exercises.  Pt continues to have dificulty sequencing walking poles, but improved ability to do this with cues for increased step length and increased deliberate L UE movement and placement of poles.  Pt will continue to benefit from further skilled PT to address balance, gait and posture for improved funcitonal mobility.   Rehab Potential Good   PT Frequency 1x / week   PT Duration 8 weeks   PT Treatment/Interventions ADLs/Self Care Home Management;Therapeutic exercise;Therapeutic activities;Functional mobility training;Gait training;Patient/family education;DME Instruction;Neuromuscular re-education;Balance training;Manual techniques   PT Next Visit Plan  exercise chart for prioritizing exercises;  continue work on tandem stance, single limb stance, and  review of walking poles on indoor/outdoor surfaces.  Check STGs next week.   Consulted and Agree with Plan of Care Patient;Family member/caregiver   Family Member Consulted wife      Patient will benefit from skilled therapeutic intervention in order to improve the following deficits and impairments:  Abnormal gait, Decreased balance, Decreased endurance, Difficulty walking, Impaired flexibility, Postural dysfunction, Decreased activity tolerance, Decreased mobility  Visit Diagnosis: Other abnormalities of gait and mobility     Problem List Patient Active Problem List   Diagnosis Date Noted  . Annual physical exam 07/08/2015  . BPH without obstruction/lower urinary tract symptoms 07/08/2015  . Chronic fatigue 10/23/2014  . Chronic anxiety 10/23/2014  . Parkinson's disease ([place]) 09/20/2014  . Malnutrition ([place]) 09/20/2014  . Depression 07/26/2013  . Unsteady gait 07/26/2013  . Orthostatic hypotension 07/26/2013  . Anxiety 06/23/2013  . Chest pain 06/23/2013  . Tremor 05/18/2013  . Occlusion and stenosis of carotid artery without mention of cerebral infarction 08/30/2012    Sanaii Caporaso W. 07/23/2015, 11:16 AM  Frazier Butt., PT [facility] [address] [place], [place], [address] Phone: [phone]   Fax:  [phone]  Name: Joseph Arroyo MRN: [id number] Date of Birth: Apr 13, 1940

## 2015-07-31 ENCOUNTER — Ambulatory Visit: Payer: Medicare Other | Admitting: Physical Therapy

## 2015-07-31 DIAGNOSIS — R2689 Other abnormalities of gait and mobility: Secondary | ICD-10-CM

## 2015-08-01 NOTE — Therapy (Signed)
[facility] [address] [place], [place], [address] Phone: [phone]   Fax:  [phone]  Physical Therapy Treatment  Patient Details  Name: Joseph Arroyo MRN: [id number] Date of Birth: 1940-05-02 Referring Provider: Rexene Alberts  Encounter Date: 07/31/2015      PT End of Session - 08/01/15 0908    Visit Number 5   Number of Visits 8   Date for PT Re-Evaluation 09/02/15   Authorization Type UHC STATE/G code every 10th visit   PT Start Time 1110   PT Stop Time 1153   PT Time Calculation (min) 43 min   Activity Tolerance Patient tolerated treatment well   Behavior During Therapy [facility] for tasks assessed/performed      Past Medical History  Diagnosis Date  . Ulcer   . Depression   . Occlusion and stenosis of carotid artery without mention of cerebral infarction   . Lumbago   . Thrombocytopenia, unspecified ([place])   . Other malaise and fatigue 11/21/2007  . Allergic rhinitis due to pollen 11/21/2007  . Contusion of toe 09/12/2008  . Anxiety state, unspecified 11/28/2008  . Brachial neuritis or radiculitis NOS   . Cramp of limb   . Abnormal involuntary movements(781.0)   . Acute upper respiratory infections of unspecified site   . Cervicalgia   . Conjunctivitis unspecified   . Sprain of hand, unspecified site   . Enthesopathy of unspecified site   . Pain in limb   . Essential and other specified forms of tremor   . Hip, thigh, leg, and ankle, insect bite, nonvenomous, without mention of infection(916.4)   . Chest pain, unspecified   . Sprain of neck   . Dysfunction of eustachian tube   . Costal chondritis   . Sleep disturbance, unspecified   . Contact dermatitis and other eczema due to plants (except food)   . Acute sinusitis, unspecified   . GERD (gastroesophageal reflux disease)     in past  . Seizures ([place])     age 4 - after concussion  . Concussion     age 70 - s/p accident  . Headache    migraines - aura - no pain  . Tremors of nervous system     Parkinsonian symptoms    Past Surgical History  Procedure Laterality Date  . Other surgical history  2002    ear surgery  . Colonoscopy  2008  . Stapedes surgery Right 2002  . Cataract extraction Right 07/18/14  . Cataract extraction w/phaco Left 08/01/2014    Procedure: CATARACT EXTRACTION PHACO AND INTRAOCULAR LENS PLACEMENT (IOC);  Surgeon: Leandrew Koyanagi, MD;  Location: [facility];  Service: Ophthalmology;  Laterality: Left;  IVA TOPICAL    There were no vitals filed for this visit.      Subjective Assessment - 07/31/15 1112    Subjective Little chest discomfort-GP knows about it-wife says not heart related.   Patient Stated Goals Pt wants to return to therapy to improve low back pain and update home program.   Currently in Pain? Yes   Pain Score 3    Pain Location Back   Pain Orientation Lower   Pain Descriptors / Indicators Discomfort   Pain Type Chronic pain   Pain Onset More than a month ago   Pain Frequency Constant   Aggravating Factors  sitting in the car too long aggravates pain   Pain Relieving Factors lying down with pillow, support behind back  Self Care: -Discussed progress towards STGs.  Discussed pt's LTGs and how to continue to address and work towards those.  Discussed incorporating varied direction stepping into activities for improved stepping and balancing in posterior direction.  Discussed/review methods to decrease pain in low back-pt able to verbalize at least 3.  Pt in agreement with POC and continuing towards LTGs.          [facility] Adult PT Treatment/Exercise - 08/01/15 0904    Transfers   Transfers Sit to Stand;Stand to Sit   Sit to Stand 6: Modified independent (Device/Increase time);Without upper extremity assist;From bed;From chair/3-in-1   Stand to Sit 6: Modified independent (Device/Increase time);Without upper extremity assist;To chair/3-in-1;To  bed   Number of Reps Other sets (comment);10 reps  from 20", 18", then 17" surfaces no posterior LOB   Ambulation/Gait   Ambulation/Gait Yes   Ambulation/Gait Assistance 5: Supervision   Ambulation/Gait Assistance Details Pt responds well to cueing for sequence of walking poles.  Does need cues for slowed pace and increased step length.   Ambulation Distance (Feet) 1500 Feet   Assistive device Other (Comment)  bilateral walking poles   Gait Pattern Step-through pattern   Ambulation Surface Outdoor;Paved   Gait Comments Pt verbalizes at end of walking with poles:  take long steps, trail the poles first to start with reciprocal pattern, be deliberate L arm swing and placement.   High Level Balance   High Level Balance Comments Four square step test:  17.46 sec, 14.39 sec second trial     Single limb stance activities:  Alternating step taps at 6" and 12" steps with intermittent UE support, x 10 reps each, then side step taps x 12 reps with UE support.  Marching forward with added coordinated reciprocal arm swing, then attempted reciprocal knee taps with marching, cues to slow pace and increase hold time of hip/knee flexion for improved dynamic SLS.           PT Education - 08/01/15 0920    Education provided Yes   Education Details POC, Progress towards goals.   Person(s) Educated Patient;Spouse;Child(ren)   Methods Explanation   Comprehension Verbalized understanding          PT Short Term Goals - 07/31/15 1115    PT SHORT TERM GOAL #1   Title Pt will be independent with HEP to address balance, gait, posture, back pain.  TARGET 08/03/15   Time 4   Period Weeks   Status Achieved   PT SHORT TERM GOAL #2   Title Pt will verbalize at least 3 means to decrease back pain.   Baseline lying down, stretches, icing low back helps pain, positioning   Time 4   Period Weeks   Status Achieved   PT SHORT TERM GOAL #3   Title Pt will improve 4-square step test to less than or equal to  12 seconds for improved dynamic balance.   Time 4   Period Weeks   Status Not Met   PT SHORT TERM GOAL #4   Title Pt will perform at least 8 of 10 reps of sit<>stand transfers from lower than 18" surfaces, without UE support, with no posterior lean.   Time 4   Period Weeks   Status Achieved           PT Long Term Goals - 07/08/15 0931    PT LONG TERM GOAL #1   Title Pt will improve tandem stance to at least 10 seconds, each position, for improved standing  balance and stability.  TARGET 09/02/15   Time 8   Period Weeks   Status New   PT LONG TERM GOAL #2   Title Pt will improve single limb stance to at least 3 seconds each leg, for improved obstacle and stair negotiation.   Time 8   Period Weeks   Status New   PT LONG TERM GOAL #3   Title Pt will ambulate at least 1000 ft, using bilateral walking poles, modified independently, without complaints of back pain, for improved outdoor gait.   Time 8   Period Weeks   Status New   PT LONG TERM GOAL #4   Title Pt will verbalize/demo understanding of posture and positioning with ADLs to decrease back discomfort.   Time 8   Period Weeks               Plan - 08/01/15 9021    Clinical Impression Statement STGs assessed this visit.  STG #1, 2, 4 met.  STG #3 not met for Four-square step test, with pt having difficulty with posterior direction stepping.  Pt demonstrates improved ability for sequencing walking poles on longer distance, outdoor surfaces.  Pt will continue to benefit from further skilled PT to address balance and gait training with walking poles.   Rehab Potential Good   PT Frequency 1x / week   PT Duration 8 weeks   PT Treatment/Interventions ADLs/Self Care Home Management;Therapeutic exercise;Therapeutic activities;Functional mobility training;Gait training;Patient/family education;DME Instruction;Neuromuscular re-education;Balance training;Manual techniques   PT Next Visit Plan  continue work on tandem stance,  single limb stance, dynamic walking and balance activities incorporating SLS; review of walking poles on indoor/outdoor surfaces.     Consulted and Agree with Plan of Care Patient;Family member/caregiver   Family Member Consulted wife, daughter      Patient will benefit from skilled therapeutic intervention in order to improve the following deficits and impairments:  Abnormal gait, Decreased balance, Decreased endurance, Difficulty walking, Impaired flexibility, Postural dysfunction, Decreased activity tolerance, Decreased mobility  Visit Diagnosis: Other abnormalities of gait and mobility     Problem List Patient Active Problem List   Diagnosis Date Noted  . Annual physical exam 07/08/2015  . BPH without obstruction/lower urinary tract symptoms 07/08/2015  . Chronic fatigue 10/23/2014  . Chronic anxiety 10/23/2014  . Parkinson's disease ([place]) 09/20/2014  . Malnutrition ([place]) 09/20/2014  . Depression 07/26/2013  . Unsteady gait 07/26/2013  . Orthostatic hypotension 07/26/2013  . Anxiety 06/23/2013  . Chest pain 06/23/2013  . Tremor 05/18/2013  . Occlusion and stenosis of carotid artery without mention of cerebral infarction 08/30/2012    MARRIOTT,AMY W. 08/01/2015, 9:23 AM  Frazier Butt., PT  [facility] [address] [place], [place], [address] Phone: [phone]   Fax:  [phone]  Name: Joseph Arroyo MRN: [id number] Date of Birth: 11-Aug-1940

## 2015-08-06 ENCOUNTER — Encounter: Payer: Self-pay | Admitting: Neurology

## 2015-08-06 ENCOUNTER — Ambulatory Visit (INDEPENDENT_AMBULATORY_CARE_PROVIDER_SITE_OTHER): Payer: Medicare Other | Admitting: Neurology

## 2015-08-06 VITALS — BP 106/58 | HR 62 | Resp 16 | Ht 68.0 in | Wt 146.0 lb

## 2015-08-06 DIAGNOSIS — G20 Parkinson's disease: Secondary | ICD-10-CM

## 2015-08-06 DIAGNOSIS — G20C Parkinsonism, unspecified: Secondary | ICD-10-CM
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
## 2015-08-06 NOTE — Patient Instructions (Signed)
Please continue with your Sinemet at the current dose and your Mirapex. You are doing better and I am pleased to see you better!  I will see you back in about 4 months.

## 2015-08-07 ENCOUNTER — Ambulatory Visit: Payer: Medicare Other | Attending: Neurology | Admitting: Physical Therapy

## 2015-08-07 DIAGNOSIS — R258 Other abnormal involuntary movements: Secondary | ICD-10-CM | POA: Insufficient documentation

## 2015-08-07 DIAGNOSIS — R471 Dysarthria and anarthria: Secondary | ICD-10-CM | POA: Insufficient documentation

## 2015-08-07 DIAGNOSIS — R2681 Unsteadiness on feet: Secondary | ICD-10-CM | POA: Insufficient documentation

## 2015-08-07 DIAGNOSIS — R259 Unspecified abnormal involuntary movements: Secondary | ICD-10-CM | POA: Insufficient documentation

## 2015-08-07 DIAGNOSIS — R278 Other lack of coordination: Secondary | ICD-10-CM | POA: Insufficient documentation

## 2015-08-07 DIAGNOSIS — R29898 Other symptoms and signs involving the musculoskeletal system: Secondary | ICD-10-CM | POA: Insufficient documentation

## 2015-08-07 DIAGNOSIS — R1312 Dysphagia, oropharyngeal phase: Secondary | ICD-10-CM | POA: Insufficient documentation

## 2015-08-07 DIAGNOSIS — R293 Abnormal posture: Secondary | ICD-10-CM | POA: Insufficient documentation

## 2015-08-07 DIAGNOSIS — R29818 Other symptoms and signs involving the nervous system: Secondary | ICD-10-CM | POA: Insufficient documentation

## 2015-08-07 DIAGNOSIS — R2689 Other abnormalities of gait and mobility: Secondary | ICD-10-CM | POA: Diagnosis present

## 2015-08-08 NOTE — Therapy (Signed)
[facility] [address] [place], [place], [address] Phone: [phone]   Fax:  [phone]  Physical Therapy Treatment  Patient Details  Name: Joseph Arroyo MRN: [id number] Date of Birth: 06/01/1940 Referring Provider: Rexene Alberts  Encounter Date: 08/07/2015      PT End of Session - 08/08/15 1745    Visit Number 6   Number of Visits 8   Date for PT Re-Evaluation 09/02/15   Authorization Type [employer] STATE/G code every 10th visit   PT Start Time 1149   PT Stop Time 1235   PT Time Calculation (min) 46 min   Activity Tolerance Patient tolerated treatment well   Behavior During Therapy [facility] for tasks assessed/performed      Past Medical History  Diagnosis Date  . Ulcer   . Depression   . Occlusion and stenosis of carotid artery without mention of cerebral infarction   . Lumbago   . Thrombocytopenia, unspecified ([place])   . Other malaise and fatigue 11/21/2007  . Allergic rhinitis due to pollen 11/21/2007  . Contusion of toe 09/12/2008  . Anxiety state, unspecified 11/28/2008  . Brachial neuritis or radiculitis NOS   . Cramp of limb   . Abnormal involuntary movements(781.0)   . Acute upper respiratory infections of unspecified site   . Cervicalgia   . Conjunctivitis unspecified   . Sprain of hand, unspecified site   . Enthesopathy of unspecified site   . Pain in limb   . Essential and other specified forms of tremor   . Hip, thigh, leg, and ankle, insect bite, nonvenomous, without mention of infection(916.4)   . Chest pain, unspecified   . Sprain of neck   . Dysfunction of eustachian tube   . Costal chondritis   . Sleep disturbance, unspecified   . Contact dermatitis and other eczema due to plants (except food)   . Acute sinusitis, unspecified   . GERD (gastroesophageal reflux disease)     in past  . Seizures ([place])     age 78 - after concussion  . Concussion     age 65 - s/p accident  . Headache    migraines - aura - no pain  . Tremors of nervous system     Parkinsonian symptoms    Past Surgical History  Procedure Laterality Date  . Other surgical history  2002    ear surgery  . Colonoscopy  2008  . Stapedes surgery Right 2002  . Cataract extraction Right 07/18/14  . Cataract extraction w/phaco Left 08/01/2014    Procedure: CATARACT EXTRACTION PHACO AND INTRAOCULAR LENS PLACEMENT (IOC);  Surgeon: Leandrew Koyanagi, MD;  Location: [facility];  Service: Ophthalmology;  Laterality: Left;  IVA TOPICAL    There were no vitals filed for this visit.      Subjective Assessment - 08/07/15 1150    Subjective Went to see [name] yesterday.  No changes in medications.  Used walking poles yesterday.     Patient Stated Goals Pt wants to return to therapy to improve low back pain and update home program.   Currently in Pain? No/denies                         [facility] Adult PT Treatment/Exercise - 08/07/15 1151    Ambulation/Gait   Ambulation/Gait Yes   Ambulation/Gait Assistance 5: Supervision   Ambulation/Gait Assistance Details Pt appears to have improved sequence with walking poles, better able to self-correct sequence of  walking poles.   Ambulation Distance (Feet) 1500 Feet  outdoors, then 230 ft indoors   Assistive device Other (Comment)  bilateral walking poles   Gait Pattern Step-through pattern   Ambulation Surface Level;Indoor;Unlevel;Outdoor;Paved;Gravel;Grass   Gait Comments Short distance gait on gravel, mulch, sand and grass with turns using walking poles,  No LOB and good ability to sequence/reset sequence with walking poles.       Neuro Re-education: -Single limb stance activities at 6" and 12" step with forward step taps x 15 reps each.  Forward step taps then into posterior step and weigthshifting with UE support. -Forward step ups with 1 UE support, with cues for improved stability on stance leg, x 12 reps -Marching forwards using walking  poles to facilitate improved coordination, timing of reciprocal arm pattern.  Pt requires cues to slow pace of marching activity and to improve deliberate movement pattern with UE lifting.  -Forward step and reach x 10 reps, then side step and reach x 10 reps, then back step and reach x 10 reps -Varied direction stepping for step and weight shift in multiple and varied directions, with close supervision and verbal cues for weightshifting, especially into posterior direction.          PT Education - 08/08/15 1744    Education provided Yes   Education Details addition of posterior step and weightshift to HEP   Person(s) Educated Patient;Spouse;Child(ren)   Methods Explanation;Demonstration   Comprehension Verbalized understanding;Returned demonstration;Verbal cues required          PT Short Term Goals - 07/31/15 1115    PT SHORT TERM GOAL #1   Title Pt will be independent with HEP to address balance, gait, posture, back pain.  TARGET 08/03/15   Time 4   Period Weeks   Status Achieved   PT SHORT TERM GOAL #2   Title Pt will verbalize at least 3 means to decrease back pain.   Baseline lying down, stretches, icing low back helps pain, positioning   Time 4   Period Weeks   Status Achieved   PT SHORT TERM GOAL #3   Title Pt will improve 4-square step test to less than or equal to 12 seconds for improved dynamic balance.   Time 4   Period Weeks   Status Not Met   PT SHORT TERM GOAL #4   Title Pt will perform at least 8 of 10 reps of sit<>stand transfers from lower than 18" surfaces, without UE support, with no posterior lean.   Time 4   Period Weeks   Status Achieved           PT Long Term Goals - 07/08/15 0931    PT LONG TERM GOAL #1   Title Pt will improve tandem stance to at least 10 seconds, each position, for improved standing balance and stability.  TARGET 09/02/15   Time 8   Period Weeks   Status New   PT LONG TERM GOAL #2   Title Pt will improve single limb stance  to at least 3 seconds each leg, for improved obstacle and stair negotiation.   Time 8   Period Weeks   Status New   PT LONG TERM GOAL #3   Title Pt will ambulate at least 1000 ft, using bilateral walking poles, modified independently, without complaints of back pain, for improved outdoor gait.   Time 8   Period Weeks   Status New   PT LONG TERM GOAL #4   Title Pt will  verbalize/demo understanding of posture and positioning with ADLs to decrease back discomfort.   Time 8   Period Weeks               Plan - 08/08/15 1745    Clinical Impression Statement Initial focus on review of using walking poles as part of comprehensive HEP.  Pt demonstrates improved ability to sequence bilateral walking poles in reciprocal pattern, compared to last 2 visits.  Also focused on dynamic standing balance and single limb stance activities, with pt requiring UE support intermittently with SLS activities.   Rehab Potential Good   PT Frequency 1x / week   PT Duration 8 weeks   PT Treatment/Interventions ADLs/Self Care Home Management;Therapeutic exercise;Therapeutic activities;Functional mobility training;Gait training;Patient/family education;DME Instruction;Neuromuscular re-education;Balance training;Manual techniques   PT Next Visit Plan  continue work on tandem stance, single limb stance, dynamic walking and balance activities incorporating SLS; review of walking poles on indoor/outdoor surfaces.     Consulted and Agree with Plan of Care Patient;Family member/caregiver   Family Member Consulted wife, daughter      Patient will benefit from skilled therapeutic intervention in order to improve the following deficits and impairments:  Abnormal gait, Decreased balance, Decreased endurance, Difficulty walking, Impaired flexibility, Postural dysfunction, Decreased activity tolerance, Decreased mobility  Visit Diagnosis: Other abnormalities of gait and mobility     Problem List Patient Active  Problem List   Diagnosis Date Noted  . Annual physical exam 07/08/2015  . BPH without obstruction/lower urinary tract symptoms 07/08/2015  . Chronic fatigue 10/23/2014  . Chronic anxiety 10/23/2014  . Parkinson's disease ([place]) 09/20/2014  . Malnutrition ([place]) 09/20/2014  . Depression 07/26/2013  . Unsteady gait 07/26/2013  . Orthostatic hypotension 07/26/2013  . Anxiety 06/23/2013  . Chest pain 06/23/2013  . Tremor 05/18/2013  . Occlusion and stenosis of carotid artery without mention of cerebral infarction 08/30/2012    Crystalmarie Yasin W. 08/08/2015, 5:48 PM Frazier Butt., PT [facility] [address] [place], [place], [address] Phone: [phone]   Fax:  [phone]  Name: BUEL MOLDER MRN: [id number] Date of Birth: 09-16-1940

## 2015-08-08 NOTE — Patient Instructions (Signed)
Provided patient with handout for posterior step and weightshift -10 reps with UE support at counter or chair -cues for "step and rock" in the posterior direction

## 2015-08-13 ENCOUNTER — Ambulatory Visit: Payer: Medicare Other | Admitting: Physical Therapy

## 2015-08-13 DIAGNOSIS — R2689 Other abnormalities of gait and mobility: Secondary | ICD-10-CM | POA: Diagnosis not present

## 2015-08-13 NOTE — Therapy (Signed)
[facility] [address] [place], [place], [address] Phone: [phone]   Fax:  [phone]  Physical Therapy Treatment  Patient Details  Name: Joseph Arroyo MRN: [id number] Date of Birth: 1940/07/27 Referring Provider: Rexene Alberts  Encounter Date: 08/13/2015      PT End of Session - 08/13/15 1412    Visit Number 7   Number of Visits 8   Date for PT Re-Evaluation 09/02/15   Authorization Type UHC STATE/G code every 10th visit   PT Start Time 1318   PT Stop Time 1401   PT Time Calculation (min) 43 min   Activity Tolerance Patient tolerated treatment well   Behavior During Therapy [facility] for tasks assessed/performed      Past Medical History  Diagnosis Date  . Ulcer   . Depression   . Occlusion and stenosis of carotid artery without mention of cerebral infarction   . Lumbago   . Thrombocytopenia, unspecified ([place])   . Other malaise and fatigue 11/21/2007  . Allergic rhinitis due to pollen 11/21/2007  . Contusion of toe 09/12/2008  . Anxiety state, unspecified 11/28/2008  . Brachial neuritis or radiculitis NOS   . Cramp of limb   . Abnormal involuntary movements(781.0)   . Acute upper respiratory infections of unspecified site   . Cervicalgia   . Conjunctivitis unspecified   . Sprain of hand, unspecified site   . Enthesopathy of unspecified site   . Pain in limb   . Essential and other specified forms of tremor   . Hip, thigh, leg, and ankle, insect bite, nonvenomous, without mention of infection(916.4)   . Chest pain, unspecified   . Sprain of neck   . Dysfunction of eustachian tube   . Costal chondritis   . Sleep disturbance, unspecified   . Contact dermatitis and other eczema due to plants (except food)   . Acute sinusitis, unspecified   . GERD (gastroesophageal reflux disease)     in past  . Seizures ([place])     age 52 - after concussion  . Concussion     age 37 - s/p accident  . Headache    migraines - aura - no pain  . Tremors of nervous system     Parkinsonian symptoms    Past Surgical History  Procedure Laterality Date  . Other surgical history  2002    ear surgery  . Colonoscopy  2008  . Stapedes surgery Right 2002  . Cataract extraction Right 07/18/14  . Cataract extraction w/phaco Left 08/01/2014    Procedure: CATARACT EXTRACTION PHACO AND INTRAOCULAR LENS PLACEMENT (IOC);  Surgeon: Leandrew Koyanagi, MD;  Location: [facility];  Service: Ophthalmology;  Laterality: Left;  IVA TOPICAL    There were no vitals filed for this visit.      Subjective Assessment - 08/13/15 1319    Subjective Nothing new going on today.  Wife reports a new fall when trying to get up too fast and turn to go open the door yesterday.   Patient is accompained by: Family member  Ruth   Patient Stated Goals Pt wants to return to therapy to improve low back pain and update home program.   Currently in Pain? Yes   Pain Score 4    Pain Location Back   Pain Orientation Lower   Pain Descriptors / Indicators Discomfort   Pain Type Chronic pain   Pain Onset More than a month ago   Pain Frequency Constant   Aggravating Factors  sitting in the car too long aggravates pain (took some trips over the weekend)   Pain Relieving Factors lying down with pillow, support behind back                         [facility] Adult PT Treatment/Exercise - 08/13/15 1329    Transfers   Transfers Sit to Stand;Stand to Sit   Sit to Stand 6: Modified independent (Device/Increase time);Without upper extremity assist;From bed;From chair/3-in-1   Stand to Sit 6: Modified independent (Device/Increase time);Without upper extremity assist;To chair/3-in-1;To bed   Number of Reps Other sets (comment);10 reps  10 reps from mat, 10 reps from 18" chair, 10 from 17" chair   Comments Practiced initiating gait with change of direction turn (as in getting up from chair at home to answer the door)-Sit<>stand  from 17" chair, with side step and weightshift, then sit<>stand and large step and go turn ("peel out" step) x 10 reps.   Ambulation/Gait   Ambulation/Gait Yes   Ambulation/Gait Assistance 5: Supervision   Ambulation Distance (Feet) 600 Feet  then 200 ft   Assistive device None   Gait Pattern Step-through pattern   Ambulation Surface Level;Indoor   Gait Comments Gait with starts and stops, gait with turning practice.   High Level Balance   High Level Balance Activities Turns;Sudden stops;Tandem walking;Marching forwards;Marching backwards;Figure 8 turns  Tandem walk and tandem march fwd and back at counter       Neuro Re-education Continued: -Review of standing posterior step and weightshift provided as part of HEP last visit, x 10 reps at counter.  Pt needs initial cues for improved weightshift, but otherwise performs well. -Tandem stance 10 seconds 2 reps each position with intermittent UE support.  With sit<>stand turning activity, pt requires verbal cues for slowed, pacing for deliberate movement patterns.          PT Education - 08/13/15 1411    Education provided Yes   Education Details Turning strategies, safety with quick stops with gait.   Person(s) Educated Patient;Spouse   Methods Explanation;Demonstration;Handout   Comprehension Verbalized understanding;Returned demonstration;Verbal cues required          PT Short Term Goals - 07/31/15 1115    PT SHORT TERM GOAL #1   Title Pt will be independent with HEP to address balance, gait, posture, back pain.  TARGET 08/03/15   Time 4   Period Weeks   Status Achieved   PT SHORT TERM GOAL #2   Title Pt will verbalize at least 3 means to decrease back pain.   Baseline lying down, stretches, icing low back helps pain, positioning   Time 4   Period Weeks   Status Achieved   PT SHORT TERM GOAL #3   Title Pt will improve 4-square step test to less than or equal to 12 seconds for improved dynamic balance.   Time 4    Period Weeks   Status Not Met   PT SHORT TERM GOAL #4   Title Pt will perform at least 8 of 10 reps of sit<>stand transfers from lower than 18" surfaces, without UE support, with no posterior lean.   Time 4   Period Weeks   Status Achieved           PT Long Term Goals - 07/08/15 0931    PT LONG TERM GOAL #1   Title Pt will improve tandem stance to at least 10 seconds, each position, for improved standing balance and  stability.  TARGET 09/02/15   Time 8   Period Weeks   Status New   PT LONG TERM GOAL #2   Title Pt will improve single limb stance to at least 3 seconds each leg, for improved obstacle and stair negotiation.   Time 8   Period Weeks   Status New   PT LONG TERM GOAL #3   Title Pt will ambulate at least 1000 ft, using bilateral walking poles, modified independently, without complaints of back pain, for improved outdoor gait.   Time 8   Period Weeks   Status New   PT LONG TERM GOAL #4   Title Pt will verbalize/demo understanding of posture and positioning with ADLs to decrease back discomfort.   Time 8   Period Weeks               Plan - 08/13/15 1413    Clinical Impression Statement Focused part of session on turning with gait, turning upon initial standing, in response to pt's wife's report of near fall yesterday.  Pt does continue to need cues to slow pace and sequence movements with practice of sit<>stand and turning activities.  Pt will continue to benefit from further skilled PT to address dynamic balance and gait.   Rehab Potential Good   PT Frequency 1x / week   PT Duration 8 weeks   PT Treatment/Interventions ADLs/Self Care Home Management;Therapeutic exercise;Therapeutic activities;Functional mobility training;Gait training;Patient/family education;DME Instruction;Neuromuscular re-education;Balance training;Manual techniques   PT Next Visit Plan Review turning strategies as provided in HEP; Begin checking goals and plan for discharge in next 1-2  visits.   Recommended Other Services Pt requests OT and speech therapy referrals -PT will follow up with Dr. Katharine Look and Agree with Plan of Care Patient;Family member/caregiver   Family Member Consulted wife, daughter      Patient will benefit from skilled therapeutic intervention in order to improve the following deficits and impairments:  Abnormal gait, Decreased balance, Decreased endurance, Difficulty walking, Impaired flexibility, Postural dysfunction, Decreased activity tolerance, Decreased mobility  Visit Diagnosis: Other abnormalities of gait and mobility     Problem List Patient Active Problem List   Diagnosis Date Noted  . Annual physical exam 07/08/2015  . BPH without obstruction/lower urinary tract symptoms 07/08/2015  . Chronic fatigue 10/23/2014  . Chronic anxiety 10/23/2014  . Parkinson's disease ([place]) 09/20/2014  . Malnutrition ([place]) 09/20/2014  . Depression 07/26/2013  . Unsteady gait 07/26/2013  . Orthostatic hypotension 07/26/2013  . Anxiety 06/23/2013  . Chest pain 06/23/2013  . Tremor 05/18/2013  . Occlusion and stenosis of carotid artery without mention of cerebral infarction 08/30/2012    Vanshika Jastrzebski W. 08/13/2015, 2:17 PM Frazier Butt., PT [facility] [address] [place], [place], [address] Phone: [phone]   Fax:  [phone]  Name: ESHAAN TITZER MRN: [id number] Date of Birth: May 24, 1940

## 2015-08-13 NOTE — Patient Instructions (Signed)
Tips to remember with turning:  -When turning or changing directions from a stopped position, you will want to take a big, "peel out" step, with the first step "peeling out" in the direction you are wanting to go.  -When turning while you are walking, you want to make sure to keep your feet wide and shift your weight from side to side until you get to the direction you want to go.    When you stop quickly with walking: -Make sure to stop with your feet apart and your feet somewhat staggered, in order to be able to shift your weight to prevent losing your balance.

## 2015-08-14 ENCOUNTER — Other Ambulatory Visit: Payer: Self-pay

## 2015-08-14 DIAGNOSIS — G20 Parkinson's disease: Secondary | ICD-10-CM

## 2015-08-22 ENCOUNTER — Ambulatory Visit: Payer: Medicare Other | Admitting: Speech Pathology

## 2015-08-22 ENCOUNTER — Ambulatory Visit: Payer: Medicare Other | Admitting: Physical Therapy

## 2015-08-22 DIAGNOSIS — R2689 Other abnormalities of gait and mobility: Secondary | ICD-10-CM | POA: Diagnosis not present

## 2015-08-22 DIAGNOSIS — R471 Dysarthria and anarthria: Secondary | ICD-10-CM

## 2015-08-22 DIAGNOSIS — R293 Abnormal posture: Secondary | ICD-10-CM

## 2015-08-22 DIAGNOSIS — R1312 Dysphagia, oropharyngeal phase: Secondary | ICD-10-CM

## 2015-08-22 NOTE — Therapy (Signed)
[facility] [address] [place], [place], [address] Phone: [phone]   Fax:  [phone]  Speech Language Pathology Evaluation  Patient Details  Name: Joseph Arroyo MRN: [id number] Date of Birth: 15-Apr-1940 Referring Provider: Dr. Star Age  Encounter Date: 08/22/2015      End of Session - 08/22/15 1502    Visit Number 1   Number of Visits 17   Date for SLP Re-Evaluation 10/17/15   SLP Start Time 1314   SLP Stop Time  1402   SLP Time Calculation (min) 48 min      Past Medical History  Diagnosis Date  . Ulcer   . Depression   . Occlusion and stenosis of carotid artery without mention of cerebral infarction   . Lumbago   . Thrombocytopenia, unspecified ([place])   . Other malaise and fatigue 11/21/2007  . Allergic rhinitis due to pollen 11/21/2007  . Contusion of toe 09/12/2008  . Anxiety state, unspecified 11/28/2008  . Brachial neuritis or radiculitis NOS   . Cramp of limb   . Abnormal involuntary movements(781.0)   . Acute upper respiratory infections of unspecified site   . Cervicalgia   . Conjunctivitis unspecified   . Sprain of hand, unspecified site   . Enthesopathy of unspecified site   . Pain in limb   . Essential and other specified forms of tremor   . Hip, thigh, leg, and ankle, insect bite, nonvenomous, without mention of infection(916.4)   . Chest pain, unspecified   . Sprain of neck   . Dysfunction of eustachian tube   . Costal chondritis   . Sleep disturbance, unspecified   . Contact dermatitis and other eczema due to plants (except food)   . Acute sinusitis, unspecified   . GERD (gastroesophageal reflux disease)     in past  . Seizures ([place])     age 27 - after concussion  . Concussion     age 76 - s/p accident  . Headache     migraines - aura - no pain  . Tremors of nervous system     Parkinsonian symptoms    Past Surgical History  Procedure Laterality Date  . Other  surgical history  2002    ear surgery  . Colonoscopy  2008  . Stapedes surgery Right 2002  . Cataract extraction Right 07/18/14  . Cataract extraction w/phaco Left 08/01/2014    Procedure: CATARACT EXTRACTION PHACO AND INTRAOCULAR LENS PLACEMENT (IOC);  Surgeon: Leandrew Koyanagi, MD;  Location: [facility];  Service: Ophthalmology;  Laterality: Left;  IVA TOPICAL    There were no vitals filed for this visit.      Subjective Assessment - 08/22/15 1332    Subjective "I have trouble hearing him in the car and the house"   Currently in Pain? Yes   Pain Score 3    Pain Location Back   Pain Orientation Lower   Pain Descriptors / Indicators Discomfort   Pain Type Chronic pain   Pain Onset More than a month ago   Pain Frequency Constant            SLP Evaluation [facility] - 08/22/15 1332    SLP Visit Information   SLP Received On 08/22/15   Referring Provider Dr. Star Age   Onset Date 05/2015   Medical Diagnosis Parkinson's Disease   Subjective   Subjective "I'm expecting a miracle and the tremors will go away and everything"   Patient/Family Stated Goal I  prefer to have my voice back to like it was and dealing with the stumbling   Pain Assessment   Pain Relieving Factors back exercises   General Information   Behavioral/Cognition Occasional memory issues,  forgets appointment times and medications at times    Mobility Status walks independently; receiving PT   Prior Functional Status   Cognitive/Linguistic Baseline Baseline deficits   Type of Home House    Lives With Spouse   Available Support Family;Friend(s)   Vocation Retired   Oral Motor/Sensory Function   Overall Oral Motor/Sensory Function Appears within functional limits for tasks assessed   Motor Speech   Overall Motor Speech Impaired   Respiration Within functional limits   Phonation Hoarse;Low vocal intensity   Resonance Hypernasality   Intelligibility Intelligibility reduced   Conversation 75-100%  accurate   Motor Speech Errors Aware            General - 08/22/15 1504    General Information   Date of Onset 05/22/15   Type of Study Bedside Swallow Evaluation   Previous Swallow Assessment N/A   Diet Prior to this Study Regular;Thin liquids   Temperature Spikes Noted No   Behavior/Cognition Alert;Cooperative;Pleasant mood   Oral Cavity - Dentition Adequate natural dentition   Vision Functional for self-feeding   Self-Feeding Abilities Able to feed self   Patient Positioning Upright in chair   Baseline Vocal Quality Wet;Low vocal intensity;Hoarse   Volitional Cough Strong   Volitional Swallow Able to elicit            Thin Liquid - 08/22/15 1505    Thin Liquid   Thin Liquid Impaired   Presentation Cup   Pharyngeal  Phase Impairments Cough - Immediate;Wet Vocal Quality            Solid - 08/22/15 1506    Solid   Solid Impaired   Presentation Self Fed   Pharyngeal Phase Impairments Cough - Immediate;Cough - Delayed;Throat Clearing - Immediate;Wet Vocal Quality             SLP Education - 08/22/15 1500    Education provided Yes   Education Details goals for ST, think shout, compensations for dysarthria   Person(s) Educated Patient;Spouse   Methods Explanation;Demonstration;Verbal cues;Handout   Comprehension Verbalized understanding;Need further instruction;Verbal cues required;Returned demonstration          SLP Short Term Goals - 08/22/15 1510    SLP SHORT TERM GOAL #1   Title Pt will average 70dB at sentence level during structured speech tasks with rare min A   Time 4   Period Weeks   Status New   SLP SHORT TERM GOAL #2   Title pt will demo simple conversation of 10 minutes at 70dB   Time 4   Period Weeks   Status New   SLP SHORT TERM GOAL #3   Title Pt will participate in objective swallowing assessment with goals to follow pending MBSS   Time 4   Period Weeks   Status New          SLP Long Term Goals - 08/22/15 1512    SLP  LONG TERM GOAL #1   Title pt will demo loudness of 70dB in 15 minutes mod complex conversation during therapy, outside ST room    Time 8   Period Weeks   Status New   SLP LONG TERM GOAL #2   Title report decr of symptoms of dysphagia when engaging in compensatory strategies at home   Time  8   Period Weeks   Status New          Plan - 08/22/15 1502    Clinical Impression Statement Joseph Arroyo, a 75 y.o. male is referred for ST due to Parkinson's Disease induced dysarthria and dysphagia. Joseph Arroyo and his spouse, Joseph Arroyo, report coughing with PO, especially nuts, water and that his pills sometimes "get stuck"  PO trials of almonds and water revealed inconsistent immediate cough, change in vocal quality and slightly wet voice, indicating possible reduced airway protection and pharyngeal dysphagia.. A modified barium swallow was recommended at the time of ST PD screen in 01/2015. Pt denies h/o pna.   Joseph Arroyo reports his wife has trouble hearing him and that he notices his voice is hoarse. His wife states she has to ask him to repeat himself in the car and in the house. She also reports his voice has gotten significantly quieter and that he "doesn't project like he used to."  12 minutes of conversational speech was reduced today, at average 65dB (WNL= average 70-72dB) with range of 63-68dB, when a sound level meter was placed 30 cm away from pt's mouth. Overall intelligibility for this listener in a quiet environment was approx 95%. Production of loud /a/ averaged 93dB (range of 90 to 95) and min cues occasoinally needed for loudness.   Oral motor assessment revealed WFL lingual ROM and WFL lingual strength. Labial ROM was [facility] and strength was [facility]. Velar ROM appeared [facility].   Pt rated effort level at 5/10 for production of loud /a/ (10=maximal effort). In oral reading sentence tasks, pt was asked to use the same amount of effort as with loud /a/. Loudness average with this increased effort  was 72 dB (range of 70 to 74) with min A usually for loudness. Pt would benefit from skilled ST in order to improve speech intelligibility and pt's QOL. I also recommend MBSS to assess pharyngeal swallow and identify any postural or diet modifications.     Speech Therapy Frequency 2x / week   Duration --  8 weeks   Treatment/Interventions Pharyngeal strengthening exercises;Aspiration precaution training;Diet toleration management by SLP;Compensatory techniques;Trials of upgraded texture/liquids;SLP instruction and feedback;Compensatory strategies;Internal/external aids;Environmental controls;Oral motor exercises;Patient/family education;Cueing hierarchy;Functional tasks      Patient will benefit from skilled therapeutic intervention in order to improve the following deficits and impairments:   Dysarthria and anarthria - Plan: SLP plan of care cert/re-cert  Dysphagia, oropharyngeal phase - Plan: SLP plan of care cert/re-cert      G-Codes - [id number]    Functional Assessment Tool Used NOMS   Functional Limitations Motor speech   Motor Speech Current Status [phone]) At least 20 percent but less than 40 percent impaired, limited or restricted   Motor Speech Goal Status [id number]) At least 1 percent but less than 20 percent impaired, limited or restricted      Problem List Patient Active Problem List   Diagnosis Date Noted  . Annual physical exam 07/08/2015  . BPH without obstruction/lower urinary tract symptoms 07/08/2015  . Chronic fatigue 10/23/2014  . Chronic anxiety 10/23/2014  . Parkinson's disease ([place]) 09/20/2014  . Malnutrition ([place]) 09/20/2014  . Depression 07/26/2013  . Unsteady gait 07/26/2013  . Orthostatic hypotension 07/26/2013  . Anxiety 06/23/2013  . Chest pain 06/23/2013  . Tremor 05/18/2013  . Occlusion and stenosis of carotid artery without mention of cerebral infarction 08/30/2012    Arroyo, Joseph Rusk MS, CCC-SLP 08/22/2015, 3:28 PM  [facility] [address] [place], [place], [address] Phone: [phone]   Fax:  [phone]  Name: Joseph Arroyo MRN: [id number] Date of Birth: 1940/08/03

## 2015-08-22 NOTE — Patient Instructions (Signed)
  Loud [place]!! Effort of 5 out of 10 -   Big Breath, loud [place]! 5x twice a day  Volume is generated by our big breath   It is OK to feel like you are talking/reading too loud  Practice reading aloud, taking big breaths at the periods or commas

## 2015-08-22 NOTE — Therapy (Signed)
[facility] [address] [place], [place], [address] Phone: [phone]   Fax:  [phone]  Physical Therapy Treatment  Patient Details  Name: Joseph Arroyo MRN: [id number] Date of Birth: 10-19-1940 Referring Provider: Rexene Alberts  Encounter Date: 08/22/2015      PT End of Session - 08/22/15 1204    Visit Number 8   Number of Visits 10  per recert today to cover remaining 1-2 visits   Date for PT Re-Evaluation 09/02/15   Authorization Type [employer] STATE/G code every 10th visit   PT Start Time 1105   PT Stop Time 1149   PT Time Calculation (min) 44 min   Activity Tolerance Patient tolerated treatment well   Behavior During Therapy [facility] for tasks assessed/performed      Past Medical History  Diagnosis Date  . Ulcer   . Depression   . Occlusion and stenosis of carotid artery without mention of cerebral infarction   . Lumbago   . Thrombocytopenia, unspecified ([place])   . Other malaise and fatigue 11/21/2007  . Allergic rhinitis due to pollen 11/21/2007  . Contusion of toe 09/12/2008  . Anxiety state, unspecified 11/28/2008  . Brachial neuritis or radiculitis NOS   . Cramp of limb   . Abnormal involuntary movements(781.0)   . Acute upper respiratory infections of unspecified site   . Cervicalgia   . Conjunctivitis unspecified   . Sprain of hand, unspecified site   . Enthesopathy of unspecified site   . Pain in limb   . Essential and other specified forms of tremor   . Hip, thigh, leg, and ankle, insect bite, nonvenomous, without mention of infection(916.4)   . Chest pain, unspecified   . Sprain of neck   . Dysfunction of eustachian tube   . Costal chondritis   . Sleep disturbance, unspecified   . Contact dermatitis and other eczema due to plants (except food)   . Acute sinusitis, unspecified   . GERD (gastroesophageal reflux disease)     in past  . Seizures ([place])     age 15 - after concussion  .  Concussion     age 63 - s/p accident  . Headache     migraines - aura - no pain  . Tremors of nervous system     Parkinsonian symptoms    Past Surgical History  Procedure Laterality Date  . Other surgical history  2002    ear surgery  . Colonoscopy  2008  . Stapedes surgery Right 2002  . Cataract extraction Right 07/18/14  . Cataract extraction w/phaco Left 08/01/2014    Procedure: CATARACT EXTRACTION PHACO AND INTRAOCULAR LENS PLACEMENT (IOC);  Surgeon: Leandrew Koyanagi, MD;  Location: [facility];  Service: Ophthalmology;  Laterality: Left;  IVA TOPICAL    There were no vitals filed for this visit.      Subjective Assessment - 08/22/15 1107    Subjective Have been very busy this week.  Doing well today.  No more falls.   Patient is accompained by: Family member  wife   Patient Stated Goals Pt wants to return to therapy to improve low back pain and update home program.   Currently in Pain? Yes   Pain Score 4    Pain Location Back   Pain Orientation Lower   Pain Descriptors / Indicators Discomfort   Pain Type Chronic pain   Pain Frequency Constant   Aggravating Factors  bending over to check tire pressure   Pain  Relieving Factors back exercises alleviate pain               Therapeutic Exercise: To address pt's c/o increased back discomfort and tightness today, pt performed seated PWR! UP x 15 reps, with initial 5 reps prolonged hold for forward stretch, then standing PWR! Up x 15 reps, then standing modified quadruped at counter rocking forward and back x 15 reps, for gentle/dynamic low back stretching.  With stretching combined with gait with walking poles, pt reports decreased back pain to 2/10.  Discussed with patient and wife using the above exercises throughout the day to address low back tightness/ discomfort/stiffness as needed to manage LBP. Showed pt/discussed sitting on low stool versus half-kneel when doing activities near ground level (such as  working on air pressure of car tires).           [facility] Adult PT Treatment/Exercise - 08/22/15 0001    Ambulation/Gait   Ambulation/Gait Yes   Ambulation/Gait Assistance 6: Modified independent (Device/Increase time)   Ambulation/Gait Assistance Details Pt able to correctly sequence bilateral walking poles throughout entire outdoor gait activity, including with grass and inclines/declines.   Ambulation Distance (Feet) 1500 Feet   Assistive device --  bilateral walking poles   Gait Pattern Step-through pattern;Decreased stance time - left   Ambulation Surface Level;Indoor   Gait Comments Gait activities in gym area with turning practice/review of last visit incorporating marching turns, weightshifting turns, and leading leg "peel out" turns.  Pt return demonstrates understanding, with increasing difficulty including carrying tasks and cognitive tasks.                PT Education - 08/22/15 1203    Education provided Yes   Education Details Use of PWR! Up in sitting and PWR! Rock modified quadruped at [employer] as means of gentle/dynamic stretching when pt c/o tightness and stiffness in low back.   Person(s) Educated Patient;Spouse   Methods Explanation;Demonstration   Comprehension Verbalized understanding          PT Short Term Goals - 07/31/15 1115    PT SHORT TERM GOAL #1   Title Pt will be independent with HEP to address balance, gait, posture, back pain.  TARGET 08/03/15   Time 4   Period Weeks   Status Achieved   PT SHORT TERM GOAL #2   Title Pt will verbalize at least 3 means to decrease back pain.   Baseline lying down, stretches, icing low back helps pain, positioning   Time 4   Period Weeks   Status Achieved   PT SHORT TERM GOAL #3   Title Pt will improve 4-square step test to less than or equal to 12 seconds for improved dynamic balance.   Time 4   Period Weeks   Status Not Met   PT SHORT TERM GOAL #4   Title Pt will perform at least 8 of 10 reps of  sit<>stand transfers from lower than 18" surfaces, without UE support, with no posterior lean.   Time 4   Period Weeks   Status Achieved           PT Long Term Goals - 08/22/15 1206    PT LONG TERM GOAL #1   Title Pt will improve tandem stance to at least 10 seconds, each position, for improved standing balance and stability.  TARGET 09/02/15   Time 8   Period Weeks   Status Achieved   PT LONG TERM GOAL #2   Title Pt will improve single  limb stance to at least 3 seconds each leg, for improved obstacle and stair negotiation.   Time 8   Period Weeks   Status Achieved   PT LONG TERM GOAL #3   Title Pt will ambulate at least 1000 ft, using bilateral walking poles, modified independently, without complaints of back pain, for improved outdoor gait.   Time 8   Period Weeks   Status Achieved   PT LONG TERM GOAL #4   Title Pt will verbalize/demo understanding of posture and positioning with ADLs to decrease back discomfort.  TARGET 09/20/15   Time 2   Period Weeks   Status On-going               Plan - 08/22/15 1209    Clinical Impression Statement Pt has met LTG #1-3.  LTG #4 ongoing to review techniques and strategies to reduce/manage low back pain.  Plan to renew for 1-2 additional visits for review of posture/body mechanics and overall HEP if needed.  Pt has made good progress with use of walking poles; however, he does not report incorporating it into his exercise rout   Rehab Potential Good   PT Frequency 1x / week   PT Duration 2 weeks  additional weeks-see recert   PT Treatment/Interventions ADLs/Self Care Home Management;Therapeutic exercise;Therapeutic activities;Functional mobility training;Gait training;Patient/family education;DME Instruction;Neuromuscular re-education;Balance training;Manual techniques   PT Next Visit Plan Plan to check remaining goal for posture/ADLs for back pain management and plan for discharge in next 1-2 visits.   Consulted and Agree with Plan  of Care Patient;Family member/caregiver   Family Member Consulted wife      Patient will benefit from skilled therapeutic intervention in order to improve the following deficits and impairments:  Abnormal gait, Decreased balance, Decreased endurance, Difficulty walking, Impaired flexibility, Postural dysfunction, Decreased activity tolerance, Decreased mobility  Visit Diagnosis: Other abnormalities of gait and mobility - Plan: PT plan of care cert/re-cert  Abnormal posture - Plan: PT plan of care cert/re-cert     Problem List Patient Active Problem List   Diagnosis Date Noted  . Annual physical exam 07/08/2015  . BPH without obstruction/lower urinary tract symptoms 07/08/2015  . Chronic fatigue 10/23/2014  . Chronic anxiety 10/23/2014  . Parkinson's disease ([place]) 09/20/2014  . Malnutrition ([place]) 09/20/2014  . Depression 07/26/2013  . Unsteady gait 07/26/2013  . Orthostatic hypotension 07/26/2013  . Anxiety 06/23/2013  . Chest pain 06/23/2013  . Tremor 05/18/2013  . Occlusion and stenosis of carotid artery without mention of cerebral infarction 08/30/2012    Jabar Krysiak W. 08/22/2015, 12:22 PM  [facility] Butt., PT [facility] [address] [place], [place], [address] Phone: [phone]   Fax:  [phone]  Name: Joseph Arroyo MRN: [id number] Date of Birth: 10-Feb-1941

## 2015-08-23 ENCOUNTER — Ambulatory Visit: Payer: 59 | Admitting: Physical Therapy

## 2015-08-26 ENCOUNTER — Ambulatory Visit: Payer: Medicare Other | Admitting: Occupational Therapy

## 2015-08-26 ENCOUNTER — Ambulatory Visit: Payer: Medicare Other | Admitting: Speech Pathology

## 2015-08-26 ENCOUNTER — Ambulatory Visit: Payer: Medicare Other | Admitting: Physical Therapy

## 2015-08-26 DIAGNOSIS — R2689 Other abnormalities of gait and mobility: Secondary | ICD-10-CM | POA: Diagnosis not present

## 2015-08-26 DIAGNOSIS — R29898 Other symptoms and signs involving the musculoskeletal system: Secondary | ICD-10-CM

## 2015-08-26 DIAGNOSIS — R29818 Other symptoms and signs involving the nervous system: Secondary | ICD-10-CM

## 2015-08-26 DIAGNOSIS — R278 Other lack of coordination: Secondary | ICD-10-CM

## 2015-08-26 DIAGNOSIS — R1312 Dysphagia, oropharyngeal phase: Secondary | ICD-10-CM

## 2015-08-26 DIAGNOSIS — R2681 Unsteadiness on feet: Secondary | ICD-10-CM

## 2015-08-26 DIAGNOSIS — R293 Abnormal posture: Secondary | ICD-10-CM

## 2015-08-26 DIAGNOSIS — R258 Other abnormal involuntary movements: Secondary | ICD-10-CM

## 2015-08-26 DIAGNOSIS — R471 Dysarthria and anarthria: Secondary | ICD-10-CM

## 2015-08-26 NOTE — Patient Instructions (Signed)
Deep Squat    Squat and lift with both arms held against upper trunk. Tighten stomach muscles without holding breath. Use smooth movements to avoid jerking.  Copyright  VHI. All rights reserved.  One Knee    Slide object up one thigh, and hold close at waist level with both hands before standing up.   Copyright  VHI. All rights reserved.  Car Trunk - Reaching Down    Maintain curve of lower back when reaching into a deep trunk. Can also lift oppo- site leg backward to keep back straight, while using other hand for support.   Copyright  VHI. All rights reserved.  Pushing / Pulling    Pushing is preferable to pulling. Keep back in proper alignment, and use leg muscles to do the work.   Copyright  VHI. All rights reserved.  Housework - Vacuuming    Hold the vacuum with arm held at side. Step back and forth to move it, keeping head up. Avoid twisting.   Copyright  VHI. All rights reserved.  Lifting Principles  .Maintain proper posture and head alignment. .Slide object as close as possible before lifting. .Move obstacles out of the way. .Test before lifting; ask for help if too heavy. .Tighten stomach muscles without holding breath. .Use smooth movements; do not jerk. .Use legs to do the work, and pivot with feet. .Distribute the work load symmetrically and close to the center of trunk. .Push instead of pull whenever possible.  Copyright  VHI. All rights reserved.

## 2015-08-26 NOTE — Therapy (Signed)
[facility] [address] [place], [place], [address] Phone: [phone]   Fax:  [phone]  Occupational Therapy Evaluation  Patient Details  Name: Joseph Arroyo MRN: [id number] Date of Birth: Sep 24, 1940 Referring Provider: Dr. Star Age  Encounter Date: 08/26/2015      OT End of Session - 08/26/15 1536    Visit Number 1   Number of Visits 17   Date for OT Re-Evaluation 10/27/15   Authorization Type [employer] State, no auth req, no visit limit, G-code   Authorization - Visit Number 1   Authorization - Number of Visits 10   OT Start Time 1410   OT Stop Time 1453   OT Time Calculation (min) 43 min   Activity Tolerance Patient tolerated treatment well   Behavior During Therapy [facility] for tasks assessed/performed      Past Medical History  Diagnosis Date  . Ulcer   . Depression   . Occlusion and stenosis of carotid artery without mention of cerebral infarction   . Lumbago   . Thrombocytopenia, unspecified ([place])   . Other malaise and fatigue 11/21/2007  . Allergic rhinitis due to pollen 11/21/2007  . Contusion of toe 09/12/2008  . Anxiety state, unspecified 11/28/2008  . Brachial neuritis or radiculitis NOS   . Cramp of limb   . Abnormal involuntary movements(781.0)   . Acute upper respiratory infections of unspecified site   . Cervicalgia   . Conjunctivitis unspecified   . Sprain of hand, unspecified site   . Enthesopathy of unspecified site   . Pain in limb   . Essential and other specified forms of tremor   . Hip, thigh, leg, and ankle, insect bite, nonvenomous, without mention of infection(916.4)   . Chest pain, unspecified   . Sprain of neck   . Dysfunction of eustachian tube   . Costal chondritis   . Sleep disturbance, unspecified   . Contact dermatitis and other eczema due to plants (except food)   . Acute sinusitis, unspecified   . GERD (gastroesophageal reflux disease)     in past  .  Seizures ([place])     age 76 - after concussion  . Concussion     age 63 - s/p accident  . Headache     migraines - aura - no pain  . Tremors of nervous system     Parkinsonian symptoms    Past Surgical History  Procedure Laterality Date  . Other surgical history  2002    ear surgery  . Colonoscopy  2008  . Stapedes surgery Right 2002  . Cataract extraction Right 07/18/14  . Cataract extraction w/phaco Left 08/01/2014    Procedure: CATARACT EXTRACTION PHACO AND INTRAOCULAR LENS PLACEMENT (IOC);  Surgeon: Leandrew Koyanagi, MD;  Location: [facility];  Service: Ophthalmology;  Laterality: Left;  IVA TOPICAL    There were no vitals filed for this visit.      Subjective Assessment - 08/26/15 1413    Subjective  "I am really hoping for a miracle"   Patient is accompained by: Family member  wife   Patient Stated Goals ride his motorcycle, be active   Currently in Pain? Yes   Pain Score 2    Pain Location Back   Pain Orientation Lower   Pain Descriptors / Indicators Discomfort   Pain Type Chronic pain   Pain Frequency Intermittent   Aggravating Factors  sitting/standing for long periods   Pain Relieving Factors walking, stretches  [facility] OT Assessment - 08/26/15 0001    Assessment   Diagnosis Parkinson's disease   Referring Provider Dr. Star Age   Onset Date 02/26/16  therapy recommended at OT screen   Prior Therapy occupational therapy last d/c 07/10/14   Precautions   Precautions Fall   Balance Screen   Has the patient fallen in the past 6 months Yes   How many times? 1  slipped out of bed    Home  Environment   Family/patient expects to be discharged to: Private residence   Lives With [place]   Level of [place] Works out at gym several times per week, uses machines.  previously enjoyed riding motorcycle   ADL   Eating/Feeding --  min difficulty; eats British-style; L hand, fork upside down    Grooming --  incr time   Lower Body Bathing Increased time   Upper Body Dressing Increased time   Lower Body Dressing Increased time  occasional difficulty pulling down shirt in back   Toilet Tranfer Modified independent   Toileting - Clothing Manipulation --  min difficulty with buttons   Toileting -  Hygiene Modified Independent   Tub/Shower Transfer Modified independent   IADL   Prior Level of Function Light Housekeeping independent   Light Housekeeping --  mows occasionally, washes dishes, car maintenance   Community Mobility Drives own vehicle   Medication Management --  forgets to take medication at times   Mobility   Mobility Status History of falls;Independent   Written Expression   Dominant Hand Right   Handwriting 100% legible;Increased time   Cognition   Overall Cognitive Status --  pt makes excuses for deficits related to PD   Mini Mental State Exam  --   Attention --  wife re-directed, appears to use humor to compensate   Memory Impaired  pt repeats the same questions   Memory Impairment Decreased short term memory   Decreased Short Term Memory --  related to time, names per pt/wife   Awareness Impaired  makes excuses for PD-related deficits   Observation/Other Assessments   Observations rounded shoulders   Other Surveys  Select   Physical Performance Test   Yes   Simulated Eating Time (seconds) 21.81sec with L hand (uses L hand)  8.89sec with R hand   Simulated Eating Comments eats with L hand (British style--cuts with R hand)   Donning Doffing Jacket Time (seconds) 8.89sec   Donning Doffing Jacket Comments Buttoning/unbuttoning 3 buttons on table in 22.54sec   Coordination   9 Hole Peg Test Right;Left   Right 9 Hole Peg Test 24.88   Left 9 Hole Peg Test 27.37   Box and Blocks R-59 blocks, L-49 blocks   Tremors min-mod L resting hand tremor   Tone   Assessment Location Right Upper Extremity;Left Upper Extremity   ROM / Strength   AROM / PROM /  Strength AROM   AROM   Overall AROM  Deficits   Overall AROM Comments RUE grossly WNL, LUE mild (approx 95%) decr supination, elbow ext with full shoulder flex   RUE Tone   RUE Tone Within Functional Limits   LUE Tone   LUE Tone Mild                         OT Education - 08/26/15 1509    Education provided Yes   Education Details OT eval results; importance of medication timing;  compensation strategies for remembering to take medications; exercise and PD to prevent future complications; bradykinesia vs. weakness; recommended against squeezing ball as may incr rigidity   Person(s) Educated Patient;Spouse   Methods Explanation   Comprehension Verbalized understanding          OT Short Term Goals - 08/26/15 1554    OT SHORT TERM GOAL #1   Title Pt will be independent with updated PD-specific HEP for coordination, bradykinesia, rigidity.--check STGs 09/24/15   Time 4   Status New   OT SHORT TERM GOAL #2   Title Pt will improve ability to eat as shown by improving time on PPT#2 by at least 3sec with L hand.   Time 4   Period Weeks   Status New   OT SHORT TERM GOAL #3   Title Pt will improve coordination/functional reaching for ADLs as shown by improving score on box and blocks test by at least 3 with LUE.   Baseline L-49 blocks (R-59blocks)   Time 4   Period Weeks   Status New   OT SHORT TERM GOAL #4   Title Pt will verbalize understanding of memory/cognitive compensation strategies and ways to promote cognitive skills prn.   Baseline ----   Time 4   Period Weeks   Status New           OT Long Term Goals - 08/26/15 1558    OT LONG TERM GOAL #1   Title Pt will verbalize understanding of strategies/AE to increase ease with ADLs/IADLs prn.--check LTGs 10/25/15   Baseline ------------   Time 8   Period Weeks   Status New   OT LONG TERM GOAL #2   Title Pt will improve ability to eat as shown by improving time on PPT#2 by at least 6sec with L hand.    Baseline 21.81sec   Time 8   Period Weeks   Status New   OT LONG TERM GOAL #3   Title Pt will improve functional raching/coordination for ADLs as shown by improving score by at least 6 on box and blocks test with LUE.   Baseline L-49 blocks (R-59 blocks)   Time 8   Period Weeks   Status New   OT LONG TERM GOAL #4   Title Pt will improve ease with dressing as shown by buttoning/unbuttoning 3 buttons in less than 20sec.   Baseline 22.54sec   Time 8   Period Weeks   Status New   OT LONG TERM GOAL #5   Title -----------------------               Plan - 08/26/15 1540    Clinical Impression Statement Pt is a 74 y.o. male with diagnosis of Parkinson's disease/Parkinsonism familiar to this OT from previous occupational therapy approx 1 year ago.  PMH includes hx of anxiety, depression, and orthostatic hypotension.  Recommended pt return to occupational therapy based on screen results 02/26/15; however, pt was seeing a chiropractor for back pain and waited before returning to OT.  Pt presents today with bradykinesia, tremor, rigidity, decr coordination, abnormal posture, decr balance for ADLs/IADLs.  Pt would benefit from occupational therapy to address these deficits for incr ease with ADLs/IADLs, improved quality of life, updates to PD-specific HEP, and prevent future complications related to PD.   Rehab Potential Good   OT Frequency 2x / week   OT Duration 8 weeks  +eval   Plan review/update coordination HEP; PWR! hands (basic 4); coordinate with PT with exercise flowsheet over  next 1-3 sessions   Consulted and Agree with Plan of Care Patient      Patient will benefit from skilled therapeutic intervention in order to improve the following deficits and impairments:  Decreased cognition, Decreased mobility, Decreased range of motion, Decreased coordination, Impaired UE functional use, Impaired perceived functional ability, Decreased knowledge of use of DME, Decreased balance, Impaired  tone (bradykinesia)  Visit Diagnosis: Other symptoms and signs involving the nervous system  Other symptoms and signs involving the musculoskeletal system  Other lack of coordination  Abnormal posture  Other abnormal involuntary movements  Unsteadiness on feet      G-Codes - 08/30/15 1613    Functional Assessment Tool Used PPT#2 (LUE) in 21.81sec, L box and blocks test:  49 blocks   Functional Limitation Carrying, moving and handling objects   Carrying, Moving and Handling Objects Current Status [id number]) At least 20 percent but less than 40 percent impaired, limited or restricted   Carrying, Moving and Handling Objects Goal Status [id number]) At least 1 percent but less than 20 percent impaired, limited or restricted      Problem List Patient Active Problem List   Diagnosis Date Noted  . Annual physical exam 07/08/2015  . BPH without obstruction/lower urinary tract symptoms 07/08/2015  . Chronic fatigue 10/23/2014  . Chronic anxiety 10/23/2014  . Parkinson's disease ([place]) 09/20/2014  . Malnutrition ([place]) 09/20/2014  . Depression 07/26/2013  . Unsteady gait 07/26/2013  . Orthostatic hypotension 07/26/2013  . Anxiety 06/23/2013  . Chest pain 06/23/2013  . Tremor 05/18/2013  . Occlusion and stenosis of carotid artery without mention of cerebral infarction 08/30/2012    [facility] 08-30-15, 4:15 PM  [facility] [address] [place], [place], [address] Phone: [phone]   Fax:  [phone]  Name: DEYSHAWN PICASSO MRN: [id number] Date of Birth: [birth year]   Vianne Bulls, OTR/L [facility] [address] [place], [place]  [address] [phone] phone [phone] 08/30/15 4:15 PM

## 2015-08-26 NOTE — Therapy (Signed)
[facility] [address] [place], [place], [address] Phone: [phone]   Fax:  [phone]  Speech Language Pathology Treatment  Patient Details  Name: Joseph Arroyo MRN: [id number] Date of Birth: [birth year] Referring Provider: Dr. Star Age  Encounter Date: 08/26/2015      End of Session - 08/26/15 1303    Visit Number 2   Number of Visits 17   Date for SLP Re-Evaluation 10/17/15   SLP Start Time 1200   SLP Stop Time  1243   SLP Time Calculation (min) 43 min      Past Medical History  Diagnosis Date  . Ulcer   . Depression   . Occlusion and stenosis of carotid artery without mention of cerebral infarction   . Lumbago   . Thrombocytopenia, unspecified ([place])   . Other malaise and fatigue 11/21/2007  . Allergic rhinitis due to pollen 11/21/2007  . Contusion of toe 09/12/2008  . Anxiety state, unspecified 11/28/2008  . Brachial neuritis or radiculitis NOS   . Cramp of limb   . Abnormal involuntary movements(781.0)   . Acute upper respiratory infections of unspecified site   . Cervicalgia   . Conjunctivitis unspecified   . Sprain of hand, unspecified site   . Enthesopathy of unspecified site   . Pain in limb   . Essential and other specified forms of tremor   . Hip, thigh, leg, and ankle, insect bite, nonvenomous, without mention of infection(916.4)   . Chest pain, unspecified   . Sprain of neck   . Dysfunction of eustachian tube   . Costal chondritis   . Sleep disturbance, unspecified   . Contact dermatitis and other eczema due to plants (except food)   . Acute sinusitis, unspecified   . GERD (gastroesophageal reflux disease)     in past  . Seizures ([place])     age 22 - after concussion  . Concussion     age 75 - s/p accident  . Headache     migraines - aura - no pain  . Tremors of nervous system     Parkinsonian symptoms    Past Surgical History  Procedure Laterality Date  . Other  surgical history  2002    ear surgery  . Colonoscopy  2008  . Stapedes surgery Right 2002  . Cataract extraction Right 07/18/14  . Cataract extraction w/phaco Left 08/01/2014    Procedure: CATARACT EXTRACTION PHACO AND INTRAOCULAR LENS PLACEMENT (IOC);  Surgeon: Leandrew Koyanagi, MD;  Location: [facility];  Service: Ophthalmology;  Laterality: Left;  IVA TOPICAL    There were no vitals filed for this visit.      Subjective Assessment - 08/26/15 1206    Subjective "I have done my /a/ and the neighboors have not moved"               ADULT SLP TREATMENT - 08/26/15 1207    General Information   Behavior/Cognition Alert;Cooperative;Pleasant mood   Treatment Provided   Treatment provided Cognitive-Linquistic   Pain Assessment   Pain Assessment No/denies pain   Cognitive-Linquistic Treatment   Treatment focused on Dysarthria   Skilled Treatment Loud /a/ facilitated to recalibrate loudness with average of 92dB. Oral reading to facilitate loud volume with average of 74dB. Simple conversation with average of 70dB initally, with volume decay to 67dB. Pt reported difficulty swallowing pills today. Trained pt in hard swallow to facilitate clearance of pills and pharyngeal residue.  Pt initially required usual mod  A for effortful swallow, which was decreased to usual min A. Instructed pt to try applesauce or yogurt to facilitate clearance of large pills.    Assessment / Recommendations / Plan   Plan Continue with current plan of care   Dysphagia Recommendations   Diet recommendations Regular;Thin liquid   Medication Administration Whole meds with puree   General Recommendations   General recommendations Other(comment)  MBSS   Progression Toward Goals   Progression toward goals Progressing toward goals          SLP Education - 08/26/15 1300    Education provided Yes   Education Details s/s of  aspiration,  feel like you are talking too loud, MBSS pending, effortful  swallow   Person(s) Educated Patient;Spouse   Methods Explanation;Demonstration;Verbal cues   Comprehension Verbalized understanding;Returned demonstration;Need further instruction          SLP Short Term Goals - 08/26/15 1303    SLP SHORT TERM GOAL #1   Title Pt will average 70dB at sentence level during structured speech tasks with rare min A   Time 3   Period Weeks   Status On-going   SLP SHORT TERM GOAL #2   Title pt will demo simple conversation of 10 minutes at 70dB   Time 3   Period Weeks   Status On-going   SLP SHORT TERM GOAL #3   Title Pt will participate in objective swallowing assessment with goals to follow pending MBSS   Time 3   Period Weeks   Status On-going          SLP Long Term Goals - 08/26/15 1303    SLP LONG TERM GOAL #1   Title pt will demo loudness of 70dB in 15 minutes mod complex conversation during therapy, outside ST room    Time 7   Period Weeks   Status On-going   SLP LONG TERM GOAL #2   Title report decr of symptoms of dysphagia when engaging in compensatory strategies at home   Time 7   Period Weeks   Status On-going          Plan - 08/26/15 1301    Clinical Impression Statement [name] demonstrated average of 70dB with simple conversation with usual to occasional min modeling and verbal cues. He again reports difficulty swallowing pills, trained pt in effortful swallow. Order for MBSS placed. I attempted to schedule MBSS at [facility] per pt rerquest, left message for centeral scheduling at [place] Frequency 2x / week   Duration --  7 weeks   Treatment/Interventions Pharyngeal strengthening exercises;Aspiration precaution training;Diet toleration management by SLP;Compensatory techniques;Trials of upgraded texture/liquids;SLP instruction and feedback;Compensatory strategies;Internal/external aids;Environmental controls;Oral motor exercises;Patient/family education;Cueing hierarchy;Functional tasks   Potential to  Achieve Goals Good   Consulted and Agree with Plan of Care Patient;Family member/caregiver   Family Member Consulted spouse      Patient will benefit from skilled therapeutic intervention in order to improve the following deficits and impairments:   Dysarthria and anarthria  Dysphagia, oropharyngeal phase - Plan: SLP modified barium swallow, SLP modified barium swallow    Problem List Patient Active Problem List   Diagnosis Date Noted  . Annual physical exam 07/08/2015  . BPH without obstruction/lower urinary tract symptoms 07/08/2015  . Chronic fatigue 10/23/2014  . Chronic anxiety 10/23/2014  . Parkinson's disease ([place]) 09/20/2014  . Malnutrition ([place]) 09/20/2014  . Depression 07/26/2013  . Unsteady gait 07/26/2013  . Orthostatic hypotension 07/26/2013  . Anxiety 06/23/2013  .  Chest pain 06/23/2013  . Tremor 05/18/2013  . Occlusion and stenosis of carotid artery without mention of cerebral infarction 08/30/2012    Gimena Buick, Annye Rusk MS, CCC-SLP 08/26/2015, 1:04 PM  [facility] [address] [place], [place], [address] Phone: [phone]   Fax:  [phone]   Name: Joseph Arroyo MRN: [id number] Date of Birth: [birth year]

## 2015-08-26 NOTE — Therapy (Signed)
[facility] [address] [place], [place], [address] Phone: [phone]   Fax:  [phone]  Physical Therapy Treatment  Patient Details  Name: Joseph Arroyo MRN: [id number] Date of Birth: 1940/07/21 Referring Provider: Rexene Alberts  Encounter Date: 08/26/2015      PT End of Session - 08/26/15 1550    Visit Number 9   Number of Visits 10  per recert today to cover remaining 1-2 visits   Date for PT Re-Evaluation 09/02/15   Authorization Type [employer] STATE/G code every 10th visit   PT Start Time 1455   PT Stop Time 1541   PT Time Calculation (min) 46 min   Activity Tolerance Patient tolerated treatment well   Behavior During Therapy [facility] for tasks assessed/performed      Past Medical History  Diagnosis Date  . Ulcer   . Depression   . Occlusion and stenosis of carotid artery without mention of cerebral infarction   . Lumbago   . Thrombocytopenia, unspecified ([place])   . Other malaise and fatigue 11/21/2007  . Allergic rhinitis due to pollen 11/21/2007  . Contusion of toe 09/12/2008  . Anxiety state, unspecified 11/28/2008  . Brachial neuritis or radiculitis NOS   . Cramp of limb   . Abnormal involuntary movements(781.0)   . Acute upper respiratory infections of unspecified site   . Cervicalgia   . Conjunctivitis unspecified   . Sprain of hand, unspecified site   . Enthesopathy of unspecified site   . Pain in limb   . Essential and other specified forms of tremor   . Hip, thigh, leg, and ankle, insect bite, nonvenomous, without mention of infection(916.4)   . Chest pain, unspecified   . Sprain of neck   . Dysfunction of eustachian tube   . Costal chondritis   . Sleep disturbance, unspecified   . Contact dermatitis and other eczema due to plants (except food)   . Acute sinusitis, unspecified   . GERD (gastroesophageal reflux disease)     in past  . Seizures ([place])     age 89 - after concussion  .  Concussion     age 42 - s/p accident  . Headache     migraines - aura - no pain  . Tremors of nervous system     Parkinsonian symptoms    Past Surgical History  Procedure Laterality Date  . Other surgical history  2002    ear surgery  . Colonoscopy  2008  . Stapedes surgery Right 2002  . Cataract extraction Right 07/18/14  . Cataract extraction w/phaco Left 08/01/2014    Procedure: CATARACT EXTRACTION PHACO AND INTRAOCULAR LENS PLACEMENT (IOC);  Surgeon: Leandrew Koyanagi, MD;  Location: [facility];  Service: Ophthalmology;  Laterality: Left;  IVA TOPICAL    There were no vitals filed for this visit.      Subjective Assessment - 08/26/15 1456    Subjective Had a good weekend, didn't have too much of a chance to do the exercises we practiced last week.   Patient is accompained by: Family member  wife   Patient Stated Goals Pt wants to return to therapy to improve low back pain and update home program.   Currently in Pain? Yes   Pain Score 2    Pain Location Back   Pain Orientation Lower   Pain Descriptors / Indicators Discomfort   Pain Type Chronic pain   Pain Onset More than a month ago   Pain Frequency Intermittent  Aggravating Factors  sitting/standing for long periods   Pain Relieving Factors walking, stretches        Therapeutic Activity: -Utilized strategies of lateral weightshifting and stagger stance weightshifting to incorporate reaching activities.  Practiced low squats x 3 reps, then half-knee position for floor work (ie-working on air pressure in tires), with cues for wide BOS with return to stand, 2 reps.  Discussed/practiced best posture and body mechanics simulating the following activities:  -picking up low objects from floor  -reaching into trunk of car  -lifting objects from various heights  -sweeping/mopping/vaccuuming/mowing grass tasks (forward/back weightshift, pushing vs. Pulling)   -Provided and discussed/practiced safe lifting  principles for low back protection.   Neuro Re-education: -Forward/back walking with cues for reciprocal arm swing -Marching forward, with coordinated arm lifting, with cue for slowed pace to improve SLS -Tandem gait, tandem march, with min guard assistance with cues for abdominal activation, upright posture and use of visual targets -Forward step and stop x 20 ft, 2 reps, with cues for upright posture and slowed, deliberate pacing.  Pt has difficulty with alternating stepping and stopping activity, difficulty sequencing and narrow BOS upon stopping and starting.                         PT Education - 08/26/15 1546    Education provided Yes   Education Details Body mechanics/posture with ADLs and household activities-see instructions   Person(s) Educated Patient;Spouse   Methods Explanation;Demonstration   Comprehension Verbalized understanding;Returned demonstration;Verbal cues required          PT Short Term Goals - 07/31/15 1115    PT SHORT TERM GOAL #1   Title Pt will be independent with HEP to address balance, gait, posture, back pain.  TARGET 08/03/15   Time 4   Period Weeks   Status Achieved   PT SHORT TERM GOAL #2   Title Pt will verbalize at least 3 means to decrease back pain.   Baseline lying down, stretches, icing low back helps pain, positioning   Time 4   Period Weeks   Status Achieved   PT SHORT TERM GOAL #3   Title Pt will improve 4-square step test to less than or equal to 12 seconds for improved dynamic balance.   Time 4   Period Weeks   Status Not Met   PT SHORT TERM GOAL #4   Title Pt will perform at least 8 of 10 reps of sit<>stand transfers from lower than 18" surfaces, without UE support, with no posterior lean.   Time 4   Period Weeks   Status Achieved           PT Long Term Goals - 08/22/15 1206    PT LONG TERM GOAL #1   Title Pt will improve tandem stance to at least 10 seconds, each position, for improved standing balance  and stability.  TARGET 09/02/15   Time 8   Period Weeks   Status Achieved   PT LONG TERM GOAL #2   Title Pt will improve single limb stance to at least 3 seconds each leg, for improved obstacle and stair negotiation.   Time 8   Period Weeks   Status Achieved   PT LONG TERM GOAL #3   Title Pt will ambulate at least 1000 ft, using bilateral walking poles, modified independently, without complaints of back pain, for improved outdoor gait.   Time 8   Period Weeks   Status Achieved  PT LONG TERM GOAL #4   Title Pt will verbalize/demo understanding of posture and positioning with ADLs to decrease back discomfort.  TARGET 09/20/15   Time 2   Period Weeks   Status On-going               Plan - 08/26/15 1553    Clinical Impression Statement Session focused today on relating PWR! Moves and weightshifting and proper lifting techniques and movement patterns to functional activities.  Provided written instruction and practice of proper body mechanics and posture during functional and daily activities.  Will likely review and plan for discharge next visit.   Rehab Potential Good   PT Frequency 1x / week   PT Duration 2 weeks  additional weeks-see recert   PT Treatment/Interventions ADLs/Self Care Home Management;Therapeutic exercise;Therapeutic activities;Functional mobility training;Gait training;Patient/family education;DME Instruction;Neuromuscular re-education;Balance training;Manual techniques   PT Next Visit Plan Plan to check remaining goal for posture/ADLs for back pain management and plan for discharge next visit.   Consulted and Agree with Plan of Care Patient;Family member/caregiver   Family Member Consulted wife      Patient will benefit from skilled therapeutic intervention in order to improve the following deficits and impairments:  Abnormal gait, Decreased balance, Decreased endurance, Difficulty walking, Impaired flexibility, Postural dysfunction, Decreased activity  tolerance, Decreased mobility  Visit Diagnosis: Other abnormalities of gait and mobility  Unsteadiness on feet     Problem List Patient Active Problem List   Diagnosis Date Noted  . Annual physical exam 07/08/2015  . BPH without obstruction/lower urinary tract symptoms 07/08/2015  . Chronic fatigue 10/23/2014  . Chronic anxiety 10/23/2014  . Parkinson's disease ([place]) 09/20/2014  . Malnutrition ([place]) 09/20/2014  . Depression 07/26/2013  . Unsteady gait 07/26/2013  . Orthostatic hypotension 07/26/2013  . Anxiety 06/23/2013  . Chest pain 06/23/2013  . Tremor 05/18/2013  . Occlusion and stenosis of carotid artery without mention of cerebral infarction 08/30/2012    Marice Guidone W. 08/26/2015, 3:56 PM  Frazier Butt., PT  [facility] [address] [place], [place], [address] Phone: [phone]   Fax:  [phone]  Name: Joseph Arroyo MRN: [id number] Date of Birth: 04-22-1940

## 2015-08-27 ENCOUNTER — Other Ambulatory Visit: Payer: Self-pay | Admitting: Neurology

## 2015-08-27 DIAGNOSIS — R1310 Dysphagia, unspecified: Secondary | ICD-10-CM

## 2015-08-30 ENCOUNTER — Ambulatory Visit: Payer: Medicare Other

## 2015-08-30 ENCOUNTER — Ambulatory Visit: Payer: 59 | Admitting: Physical Therapy

## 2015-08-30 ENCOUNTER — Ambulatory Visit: Payer: Medicare Other | Admitting: Occupational Therapy

## 2015-08-30 DIAGNOSIS — R293 Abnormal posture: Secondary | ICD-10-CM

## 2015-08-30 DIAGNOSIS — R278 Other lack of coordination: Secondary | ICD-10-CM

## 2015-08-30 DIAGNOSIS — R29898 Other symptoms and signs involving the musculoskeletal system: Secondary | ICD-10-CM

## 2015-08-30 DIAGNOSIS — R29818 Other symptoms and signs involving the nervous system: Secondary | ICD-10-CM

## 2015-08-30 DIAGNOSIS — R2689 Other abnormalities of gait and mobility: Secondary | ICD-10-CM | POA: Diagnosis not present

## 2015-08-30 DIAGNOSIS — R471 Dysarthria and anarthria: Secondary | ICD-10-CM

## 2015-08-30 DIAGNOSIS — R1312 Dysphagia, oropharyngeal phase: Secondary | ICD-10-CM

## 2015-08-30 NOTE — Therapy (Signed)
[facility] [address] [place], [place], [address] Phone: [phone]   Fax:  [phone]  Speech Language Pathology Treatment  Patient Details  Name: Joseph Arroyo MRN: [id number] Date of Birth: [birth year] Referring Provider: Dr. Star Age  Encounter Date: 08/30/2015      End of Session - 08/30/15 0849    Visit Number 3   Number of Visits 17   Date for SLP Re-Evaluation 10/17/15   SLP Start Time 0804   SLP Stop Time  0845   SLP Time Calculation (min) 41 min   Activity Tolerance Patient tolerated treatment well      Past Medical History  Diagnosis Date  . Ulcer   . Depression   . Occlusion and stenosis of carotid artery without mention of cerebral infarction   . Lumbago   . Thrombocytopenia, unspecified ([place])   . Other malaise and fatigue 11/21/2007  . Allergic rhinitis due to pollen 11/21/2007  . Contusion of toe 09/12/2008  . Anxiety state, unspecified 11/28/2008  . Brachial neuritis or radiculitis NOS   . Cramp of limb   . Abnormal involuntary movements(781.0)   . Acute upper respiratory infections of unspecified site   . Cervicalgia   . Conjunctivitis unspecified   . Sprain of hand, unspecified site   . Enthesopathy of unspecified site   . Pain in limb   . Essential and other specified forms of tremor   . Hip, thigh, leg, and ankle, insect bite, nonvenomous, without mention of infection(916.4)   . Chest pain, unspecified   . Sprain of neck   . Dysfunction of eustachian tube   . Costal chondritis   . Sleep disturbance, unspecified   . Contact dermatitis and other eczema due to plants (except food)   . Acute sinusitis, unspecified   . GERD (gastroesophageal reflux disease)     in past  . Seizures ([place])     age 75 - after concussion  . Concussion     age 75 - s/p accident  . Headache     migraines - aura - no pain  . Tremors of nervous system     Parkinsonian symptoms    Past  Surgical History  Procedure Laterality Date  . Other surgical history  2002    ear surgery  . Colonoscopy  2008  . Stapedes surgery Right 2002  . Cataract extraction Right 07/18/14  . Cataract extraction w/phaco Left 08/01/2014    Procedure: CATARACT EXTRACTION PHACO AND INTRAOCULAR LENS PLACEMENT (IOC);  Surgeon: Leandrew Koyanagi, MD;  Location: [facility];  Service: Ophthalmology;  Laterality: Left;  IVA TOPICAL    There were no vitals filed for this visit.      Subjective Assessment - 08/30/15 0804    Subjective [facility] has called pt and scheduled modified on 09-09-15.               ADULT SLP TREATMENT - 08/30/15 0810    General Information   Behavior/Cognition Alert;Cooperative;Pleasant mood   Treatment Provided   Treatment provided Cognitive-Linquistic   Pain Assessment   Pain Assessment No/denies pain   Cognitive-Linquistic Treatment   Treatment focused on Dysarthria   Skilled Treatment Faciliitated normal conversational loudness with loud /a/ at average 91dB with rare min a for loudness. Initially, conversation was at average 68dB. After SLP mentioned target of 70-72dB, pt maintained volume level of average 71dB.     Assessment / Recommendations / Plan   Plan Continue with current plan  of care   Dysphagia Recommendations   Diet recommendations Regular   Medication Administration Whole meds with puree   Progression Toward Goals   Progression toward goals Progressing toward goals          SLP Education - 08/30/15 0848    Education provided Yes   Education Details Practice speech tasks 20-30 minutes a day.   Person(s) Educated Patient;Spouse   Methods Explanation   Comprehension Verbalized understanding          SLP Short Term Goals - 08/30/15 0804    SLP SHORT TERM GOAL #1   Title Pt will average 70dB at sentence level during structured speech tasks with rare min A   Time 3   Period Weeks   Status On-going   SLP SHORT TERM GOAL #2   Title  pt will demo simple conversation of 10 minutes at 70dB   Time 3   Period Weeks   Status On-going   SLP SHORT TERM GOAL #3   Title Pt will participate in objective swallowing assessment with goals to follow pending MBSS   Time 3   Period Weeks   Status On-going          SLP Long Term Goals - 08/30/15 [id number]    SLP LONG TERM GOAL #1   Title pt will demo loudness of 70dB in 15 minutes mod complex conversation during therapy, outside ST room    Time 7   Period Weeks   Status On-going   SLP LONG TERM GOAL #2   Title report decr of symptoms of dysphagia when engaging in compensatory strategies at home   Time 7   Period Weeks   Status On-going          Plan - 08/30/15 0849    Clinical Impression Statement Mr. Facchini demonstrated average of 70dB with simple-mod complex conversation with initial verbal cues.His MBSS scheuduled for 09-09-15. Pt would beneift from cont ST to address reduced loudness in conversation as well as swallowing abilities.    Speech Therapy Frequency 2x / week   Duration --  7 weeks   Treatment/Interventions Pharyngeal strengthening exercises;Aspiration precaution training;Diet toleration management by SLP;Compensatory techniques;Trials of upgraded texture/liquids;SLP instruction and feedback;Compensatory strategies;Internal/external aids;Environmental controls;Oral motor exercises;Patient/family education;Cueing hierarchy;Functional tasks   Potential to Achieve Goals Good   Consulted and Agree with Plan of Care Patient;Family member/caregiver   Family Member Consulted spouse      Patient will benefit from skilled therapeutic intervention in order to improve the following deficits and impairments:   Dysarthria and anarthria  Dysphagia, oropharyngeal phase    Problem List Patient Active Problem List   Diagnosis Date Noted  . Annual physical exam 07/08/2015  . BPH without obstruction/lower urinary tract symptoms 07/08/2015  . Chronic fatigue  10/23/2014  . Chronic anxiety 10/23/2014  . Parkinson's disease ([place]) 09/20/2014  . Malnutrition ([place]) 09/20/2014  . Depression 07/26/2013  . Unsteady gait 07/26/2013  . Orthostatic hypotension 07/26/2013  . Anxiety 06/23/2013  . Chest pain 06/23/2013  . Tremor 05/18/2013  . Occlusion and stenosis of carotid artery without mention of cerebral infarction 08/30/2012    [facility] ,[place], [place]  08/30/2015, 8:50 AM  [facility] [address] [place], [place], [address] Phone: [phone]   Fax:  [phone]   Name: CHAROD LINQUIST MRN: [id number] Date of Birth: 25-Nov-1940

## 2015-08-30 NOTE — Patient Instructions (Addendum)
PWR! Hands  With arms stretched out in front of you (elbows straight), perform the following:  PWR! Up: Close hands and flick fingers open and apart BIG  PWR! Rock: Move wrists up and down [employer]! Twist: Twist palms up and down BIG  Then, start with elbows bent and hands closed.  PWR! Step: Touch index finger to thumb while keeping other fingers straight. Flick fingers out BIG (thumb out/straighten fingers). Repeat with other fingers. (Step your thumb to each finger).   ** Make each movement big and deliberate so that you feel the movement.  Perform at least 10 repetitions 1x/day, but perform PWR! hands throughout the day when you are having trouble using your hands (picking up/manipulating small objects, writing, eating, typing, sewing, buttoning, etc.).    Coordination Exercises  Perform the following exercises for 20 minutes 1 times per day. Perform with both hand(s). Perform using big movements.   Flipping Cards: Place deck of cards on the table. Flip cards over by opening your hand big to grasp and then turn your palm up big.  Deal cards: Hold 1/2 or whole deck in your hand. Use thumb to push card off top of deck with one big push.  Rotate ball with fingertips: Pick up with fingers/thumb and move as much as you can with each turn/movement (clockwise and counter-clockwise).  Toss ball from one hand to the other: Toss big/high.  Toss ball in the air and catch with the same hand: Toss big/high.  Pick up coins and place in coin bank or container: Pick up with big, intentional movements. Do not drag coin to the edge.  Pick up coins and stack one at a time: Pick up with big, intentional movements. Do not drag coin to the edge. (5-10 in a stack)  Pick up 5-10 coins one at a time and hold in palm. Then, move coins from palm to fingertips one at time and place in coin bank/container.  Pick up 5-10 coins one at a time and hold in palm. Then, move coins from palm to fingertips  one at a time to stack.  Practice writing: Slow down, write big, and focus on forming each letter.  Perform "Flicks"/hand stretches (PWR! Hands): Close hands then flick out your fingers with focus on opening hands, pulling wrists back, and extending elbows like you are pushing.

## 2015-08-30 NOTE — Patient Instructions (Signed)
  Please complete the assigned speech therapy homework and return it to your next session.  

## 2015-08-30 NOTE — Therapy (Signed)
[facility] [address] [place] [employer], [place], [address] Phone: [phone]   Fax:  [phone]  Occupational Therapy Treatment  Patient Details  Name: Joseph Arroyo MRN: [id number] Date of Birth: 03-Jul-1940 Referring Provider: Dr. Star Age  Encounter Date: 08/30/2015      OT End of Session - 08/30/15 0913    Visit Number 2   Number of Visits 17   Date for OT Re-Evaluation 10/27/15   Authorization Type [employer] State, no auth req, no visit limit, G-code   Authorization - Visit Number 2   Authorization - Number of Visits 10   OT Start Time 0848   OT Stop Time 0930   OT Time Calculation (min) 42 min   Activity Tolerance Patient tolerated treatment well   Behavior During Therapy [facility] for tasks assessed/performed      Past Medical History  Diagnosis Date  . Ulcer   . Depression   . Occlusion and stenosis of carotid artery without mention of cerebral infarction   . Lumbago   . Thrombocytopenia, unspecified ([place])   . Other malaise and fatigue 11/21/2007  . Allergic rhinitis due to pollen 11/21/2007  . Contusion of toe 09/12/2008  . Anxiety state, unspecified 11/28/2008  . Brachial neuritis or radiculitis NOS   . Cramp of limb   . Abnormal involuntary movements(781.0)   . Acute upper respiratory infections of unspecified site   . Cervicalgia   . Conjunctivitis unspecified   . Sprain of hand, unspecified site   . Enthesopathy of unspecified site   . Pain in limb   . Essential and other specified forms of tremor   . Hip, thigh, leg, and ankle, insect bite, nonvenomous, without mention of infection(916.4)   . Chest pain, unspecified   . Sprain of neck   . Dysfunction of eustachian tube   . Costal chondritis   . Sleep disturbance, unspecified   . Contact dermatitis and other eczema due to plants (except food)   . Acute sinusitis, unspecified   . GERD (gastroesophageal reflux disease)     in past  . Seizures  ([place])     age 48 - after concussion  . Concussion     age 4 - s/p accident  . Headache     migraines - aura - no pain  . Tremors of nervous system     Parkinsonian symptoms    Past Surgical History  Procedure Laterality Date  . Other surgical history  2002    ear surgery  . Colonoscopy  2008  . Stapedes surgery Right 2002  . Cataract extraction Right 07/18/14  . Cataract extraction w/phaco Left 08/01/2014    Procedure: CATARACT EXTRACTION PHACO AND INTRAOCULAR LENS PLACEMENT (IOC);  Surgeon: Leandrew Koyanagi, MD;  Location: [facility];  Service: Ophthalmology;  Laterality: Left;  IVA TOPICAL    There were no vitals filed for this visit.      Subjective Assessment - 08/30/15 0848    Patient is accompained by: Family member   Patient Stated Goals ride his motorcycle, be active   Currently in Pain? Yes   Pain Score 3    Pain Location Back   Pain Orientation Lower   Pain Descriptors / Indicators Discomfort   Pain Type Chronic pain   Pain Onset More than a month ago   Pain Frequency Intermittent   Aggravating Factors  driving   Pain Relieving Factors repositioning   Multiple Pain Sites No  Tossing scarves to targets in standing with trunk rotation with bilateral UE's using large amplitude movements, min v.c Ambulating while tossing/ catching scarf performing category generation, several LOB and mod v.c. For cognitive component            OT Education - 08/30/15 1338    Education provided Yes   Education Details Coordination HEP, PWR! hands basic 4, importance of emphasis on exericsing extensor muscles insead of flexors at gym   Person(s) Educated Patient;Spouse   Methods Explanation;Demonstration   Comprehension Verbalized understanding;Returned demonstration;Verbal cues required          OT Short Term Goals - 08/26/15 1554    OT SHORT TERM GOAL #1   Title Pt will be independent with updated PD-specific HEP for  coordination, bradykinesia, rigidity.--check STGs 09/24/15   Time 4   Status New   OT SHORT TERM GOAL #2   Title Pt will improve ability to eat as shown by improving time on PPT#2 by at least 3sec with L hand.   Time 4   Period Weeks   Status New   OT SHORT TERM GOAL #3   Title Pt will improve coordination/functional reaching for ADLs as shown by improving score on box and blocks test by at least 3 with LUE.   Baseline L-49 blocks (R-59blocks)   Time 4   Period Weeks   Status New   OT SHORT TERM GOAL #4   Title Pt will verbalize understanding of memory/cognitive compensation strategies and ways to promote cognitive skills prn.   Baseline ----   Time 4   Period Weeks   Status New           OT Long Term Goals - 08/26/15 1558    OT LONG TERM GOAL #1   Title Pt will verbalize understanding of strategies/AE to increase ease with ADLs/IADLs prn.--check LTGs 10/25/15   Baseline ------------   Time 8   Period Weeks   Status New   OT LONG TERM GOAL #2   Title Pt will improve ability to eat as shown by improving time on PPT#2 by at least 6sec with L hand.   Baseline 21.81sec   Time 8   Period Weeks   Status New   OT LONG TERM GOAL #3   Title Pt will improve functional raching/coordination for ADLs as shown by improving score by at least 6 on box and blocks test with LUE.   Baseline L-49 blocks (R-59 blocks)   Time 8   Period Weeks   Status New   OT LONG TERM GOAL #4   Title Pt will improve ease with dressing as shown by buttoning/unbuttoning 3 buttons in less than 20sec.   Baseline 22.54sec   Time 8   Period Weeks   Status New   OT LONG TERM GOAL #5   Title -----------------------               Plan - 08/30/15 1333    Clinical Impression Statement Pt demonstrates understanding of coordination HEP and PWR! hands. Pt can benefit from flowsheet to coordinate which exercises to perform on each day.   Rehab Potential Good   OT Frequency 2x / week   OT Duration 8  weeks   OT Treatment/Interventions Self-care/ADL training;Therapeutic exercise;Patient/family education;Balance training;Manual Therapy;Neuromuscular education;Energy conservation;Therapeutic exercises;Therapeutic activities;DME and/or AE instruction;Parrafin;Electrical Stimulation;Fluidtherapy;Cognitive remediation/compensation;Visual/perceptual remediation/compensation;Passive range of motion;Contrast Bath;Moist Heat   Plan coordinate exercise flowsheet with PT, dual - tasking   OT Home Exercise Plan issued coordination HEP, PWR!  hands   Consulted and Agree with Plan of Care Patient      Patient will benefit from skilled therapeutic intervention in order to improve the following deficits and impairments:  Decreased cognition, Decreased mobility, Decreased range of motion, Decreased coordination, Impaired UE functional use, Impaired perceived functional ability, Decreased knowledge of use of DME, Decreased balance, Impaired tone  Visit Diagnosis: Other symptoms and signs involving the nervous system  Other symptoms and signs involving the musculoskeletal system  Other lack of coordination  Abnormal posture    Problem List Patient Active Problem List   Diagnosis Date Noted  . Annual physical exam 07/08/2015  . BPH without obstruction/lower urinary tract symptoms 07/08/2015  . Chronic fatigue 10/23/2014  . Chronic anxiety 10/23/2014  . Parkinson's disease ([place]) 09/20/2014  . Malnutrition ([place]) 09/20/2014  . Depression 07/26/2013  . Unsteady gait 07/26/2013  . Orthostatic hypotension 07/26/2013  . Anxiety 06/23/2013  . Chest pain 06/23/2013  . Tremor 05/18/2013  . Occlusion and stenosis of carotid artery without mention of cerebral infarction 08/30/2012    Kelda Azad 08/30/2015, 1:41 PM Theone Murdoch, OTR/L [phone] Phone: [phone] 1:41 PM 08/30/2015 [facility] [address] [place], [place],  [address] Phone: [phone]   Fax:  [phone]  Name: TYGA KACH MRN: [id number] Date of Birth: September 16, 1940

## 2015-09-02 ENCOUNTER — Ambulatory Visit: Payer: Medicare Other | Admitting: Occupational Therapy

## 2015-09-05 ENCOUNTER — Ambulatory Visit: Payer: Medicare Other

## 2015-09-05 ENCOUNTER — Ambulatory Visit: Payer: Medicare Other | Admitting: Occupational Therapy

## 2015-09-05 ENCOUNTER — Ambulatory Visit: Payer: Medicare Other | Attending: Neurology | Admitting: Physical Therapy

## 2015-09-05 DIAGNOSIS — R2681 Unsteadiness on feet: Secondary | ICD-10-CM | POA: Insufficient documentation

## 2015-09-05 DIAGNOSIS — R1310 Dysphagia, unspecified: Secondary | ICD-10-CM | POA: Diagnosis present

## 2015-09-05 DIAGNOSIS — R293 Abnormal posture: Secondary | ICD-10-CM | POA: Diagnosis present

## 2015-09-05 DIAGNOSIS — R278 Other lack of coordination: Secondary | ICD-10-CM

## 2015-09-05 DIAGNOSIS — R471 Dysarthria and anarthria: Secondary | ICD-10-CM | POA: Insufficient documentation

## 2015-09-05 DIAGNOSIS — R29818 Other symptoms and signs involving the nervous system: Secondary | ICD-10-CM | POA: Diagnosis present

## 2015-09-05 DIAGNOSIS — R29898 Other symptoms and signs involving the musculoskeletal system: Secondary | ICD-10-CM

## 2015-09-05 DIAGNOSIS — R2689 Other abnormalities of gait and mobility: Secondary | ICD-10-CM

## 2015-09-05 DIAGNOSIS — R1312 Dysphagia, oropharyngeal phase: Secondary | ICD-10-CM | POA: Insufficient documentation

## 2015-09-05 NOTE — Therapy (Signed)
[facility] [address] [place], [place], [address] Phone: [phone]   Fax:  [phone]  Speech Language Pathology Treatment  Patient Details  Name: Joseph Arroyo MRN: [id number] Date of Birth: 17-Mar-1940 Referring Provider: Dr. Star Age  Encounter Date: 09/05/2015      End of Session - 09/05/15 1201    Visit Number 4   Number of Visits 17   Date for SLP Re-Evaluation 10/17/15   SLP Start Time 1104   SLP Stop Time  1146   SLP Time Calculation (min) 42 min   Activity Tolerance Patient tolerated treatment well      Past Medical History  Diagnosis Date  . Ulcer   . Depression   . Occlusion and stenosis of carotid artery without mention of cerebral infarction   . Lumbago   . Thrombocytopenia, unspecified ([place])   . Other malaise and fatigue 11/21/2007  . Allergic rhinitis due to pollen 11/21/2007  . Contusion of toe 09/12/2008  . Anxiety state, unspecified 11/28/2008  . Brachial neuritis or radiculitis NOS   . Cramp of limb   . Abnormal involuntary movements(781.0)   . Acute upper respiratory infections of unspecified site   . Cervicalgia   . Conjunctivitis unspecified   . Sprain of hand, unspecified site   . Enthesopathy of unspecified site   . Pain in limb   . Essential and other specified forms of tremor   . Hip, thigh, leg, and ankle, insect bite, nonvenomous, without mention of infection(916.4)   . Chest pain, unspecified   . Sprain of neck   . Dysfunction of eustachian tube   . Costal chondritis   . Sleep disturbance, unspecified   . Contact dermatitis and other eczema due to plants (except food)   . Acute sinusitis, unspecified   . GERD (gastroesophageal reflux disease)     in past  . Seizures ([place])     age 35 - after concussion  . Concussion     age 23 - s/p accident  . Headache     migraines - aura - no pain  . Tremors of nervous system     Parkinsonian symptoms    Past  Surgical History  Procedure Laterality Date  . Other surgical history  2002    ear surgery  . Colonoscopy  2008  . Stapedes surgery Right 2002  . Cataract extraction Right 07/18/14  . Cataract extraction w/phaco Left 08/01/2014    Procedure: CATARACT EXTRACTION PHACO AND INTRAOCULAR LENS PLACEMENT (IOC);  Surgeon: Leandrew Koyanagi, MD;  Location: [facility];  Service: Ophthalmology;  Laterality: Left;  IVA TOPICAL    There were no vitals filed for this visit.      Subjective Assessment - 09/05/15 1118    Subjective Pt, prior to loud /a/, maintained conversation/monologue for 13 minutes with average 71dB.               ADULT SLP TREATMENT - 09/05/15 1122    General Information   Behavior/Cognition Alert;Cooperative;Pleasant mood   Treatment Provided   Treatment provided Cognitive-Linquistic   Pain Assessment   Pain Assessment No/denies pain   Cognitive-Linquistic Treatment   Treatment focused on Dysarthria   Skilled Treatment Loud /a/ performed to facilitate WNL conversational volume - average 94dB. Prior to loud /a/ pt maintained 13 mintue simple to mod complex conversation with average 71dB. In conversation around the clinic for 7 minutes pt maintained 100% intelligibility, Pt maintained intelligibilty in 10-minute simple to mod  complex conversation outdoors 100% of the time, with traffic noise.    Assessment / Recommendations / Plan   Plan Continue with current plan of care   Dysphagia Recommendations   Diet recommendations Regular   Medication Administration Whole meds with puree   Progression Toward Goals   Progression toward goals Progressing toward goals  goals updated where needed            SLP Short Term Goals - 09/05/15 1120    SLP SHORT TERM GOAL #1   Title Pt will average 70dB at sentence level during structured speech tasks with rare min A   Status Achieved   SLP SHORT TERM GOAL #2   Title pt will demo simple conversation of 10 minutes at  70dB over 2 sessions   Baseline one session 09-05-15   Time 2   Period Weeks   Status Revised   SLP SHORT TERM GOAL #3   Title Pt will participate in objective swallowing assessment with goals to follow pending MBSS   Time 2   Period Weeks   Status On-going          SLP Long Term Goals - 09/05/15 1121    SLP LONG TERM GOAL #1   Title pt will demo loudness of 70dB in 15 minutes mod complex conversation during therapy, outside [place] room, in 2 sessions   Time 6   Period Weeks   Status Revised   SLP LONG TERM GOAL #2   Title report decr of symptoms of dysphagia when engaging in compensatory strategies at home   Time 6   Period Weeks   Status On-going          Plan - 09/05/15 1201    Clinical Impression Statement Mr. Darnell demonstrated average of 71dB with simple-mod complex conversation of 13 minutes prior to loud /a/. After loud /a/ he was 100% intelligible outdoors of ST. Pt would beneift from cont ST to address consistency of loudness in conversation as well as swallowing abilities. Modified barium swallow is next week.   Speech Therapy Frequency 2x / week   Duration --  6 weeks   Treatment/Interventions Pharyngeal strengthening exercises;Aspiration precaution training;Diet toleration management by SLP;Compensatory techniques;Trials of upgraded texture/liquids;SLP instruction and feedback;Compensatory strategies;Internal/external aids;Environmental controls;Oral motor exercises;Patient/family education;Cueing hierarchy;Functional tasks   Potential to Achieve Goals Good   Consulted and Agree with Plan of Care Patient;Family member/caregiver   Family Member Consulted spouse      Patient will benefit from skilled therapeutic intervention in order to improve the following deficits and impairments:   Dysarthria and anarthria  Dysphagia, oropharyngeal phase    Problem List Patient Active Problem List   Diagnosis Date Noted  . Annual physical exam 07/08/2015  . BPH  without obstruction/lower urinary tract symptoms 07/08/2015  . Chronic fatigue 10/23/2014  . Chronic anxiety 10/23/2014  . Parkinson's disease ([place]) 09/20/2014  . Malnutrition ([place]) 09/20/2014  . Depression 07/26/2013  . Unsteady gait 07/26/2013  . Orthostatic hypotension 07/26/2013  . Anxiety 06/23/2013  . Chest pain 06/23/2013  . Tremor 05/18/2013  . Occlusion and stenosis of carotid artery without mention of cerebral infarction 08/30/2012    [facility] ,[place], [place]  09/05/2015, 12:04 PM  [facility] [address] [place], [place], [address] Phone: [phone]   Fax:  [phone]   Name: Joseph Arroyo MRN: [id number] Date of Birth: Jun 23, 1940

## 2015-09-05 NOTE — Therapy (Signed)
[facility] [address] [place], [place], [address] Phone: [phone]   Fax:  [phone]  Occupational Therapy Treatment  Patient Details  Name: Joseph Arroyo MRN: [id number] Date of Birth: [birth year] Referring Provider: Dr. Star Age  Encounter Date: 09/05/2015      OT End of Session - 09/05/15 1541    Visit Number 3   Number of Visits 17   Date for OT Re-Evaluation 10/27/15   Authorization Type [employer] State, no auth req, no visit limit, G-code   Authorization - Visit Number 3   Authorization - Number of Visits 10   OT Start Time [id number]   OT Stop Time 1630   OT Time Calculation (min) 60 min   Activity Tolerance Patient tolerated treatment well   Behavior During Therapy [facility] for tasks assessed/performed      Past Medical History  Diagnosis Date  . Ulcer   . Depression   . Occlusion and stenosis of carotid artery without mention of cerebral infarction   . Lumbago   . Thrombocytopenia, unspecified ([place])   . Other malaise and fatigue 11/21/2007  . Allergic rhinitis due to pollen 11/21/2007  . Contusion of toe 09/12/2008  . Anxiety state, unspecified 11/28/2008  . Brachial neuritis or radiculitis NOS   . Cramp of limb   . Abnormal involuntary movements(781.0)   . Acute upper respiratory infections of unspecified site   . Cervicalgia   . Conjunctivitis unspecified   . Sprain of hand, unspecified site   . Enthesopathy of unspecified site   . Pain in limb   . Essential and other specified forms of tremor   . Hip, thigh, leg, and ankle, insect bite, nonvenomous, without mention of infection(916.4)   . Chest pain, unspecified   . Sprain of neck   . Dysfunction of eustachian tube   . Costal chondritis   . Sleep disturbance, unspecified   . Contact dermatitis and other eczema due to plants (except food)   . Acute sinusitis, unspecified   . GERD (gastroesophageal reflux disease)     in past  . Seizures  ([place])     age 67 - after concussion  . Concussion     age 12 - s/p accident  . Headache     migraines - aura - no pain  . Tremors of nervous system     Parkinsonian symptoms    Past Surgical History  Procedure Laterality Date  . Other surgical history  2002    ear surgery  . Colonoscopy  2008  . Stapedes surgery Right 2002  . Cataract extraction Right 07/18/14  . Cataract extraction w/phaco Left 08/01/2014    Procedure: CATARACT EXTRACTION PHACO AND INTRAOCULAR LENS PLACEMENT (IOC);  Surgeon: Leandrew Koyanagi, MD;  Location: [facility];  Service: Ophthalmology;  Laterality: Left;  IVA TOPICAL    There were no vitals filed for this visit.      Subjective Assessment - 09/05/15 1537    Subjective  "I really love working out at the gym"   Patient is accompained by: Family member   Patient Stated Goals ride his motorcycle, be active   Currently in Pain? No/denies         Pt instructed in shoulder/elbow complications with PD and how to address/prevent with use of large amplitude movements and associated trunk movements.  Pt verbalized understanding.  Neuro re-ed:  PWR! Multi-directional Movements:    Stepping to given sequence with min difficulty/cues for dual task/cognitive component  and large amplitude movements  Then multi-directional reaching to given sequence with min difficulty/cues for dual task/cognitive component and large amplitude movements .   Followed by multi-directional reaching with stepping with min difficulty/cues for dual task/cognitive component and large amplitude.     Then repeated reaching and stepping while calling out given colors with min difficulties/cues for dual task/additional cognitive component/large amplitude movements.   In standing,  functional reaching in diagonal pattern incorporating trunk rotation/wt. shift and PWR! Reach to toss scarves with PWR! Hands and PWR step  With min cues for incr movment amplitude (incr difficulty  with L>R).   Sliding cards off table by using PWR! Hands with focus on finger ext and min cues initially for incr movement amplitude each hand.    PWR! hands (up and step)  each with min cues For incr movement amplitude.                          OT Education - 09/05/15 1654    Education Details appropriate community fitness activities (focus more on extensors than flexors and recommended against bicep curls due to L elbow discomfort/pain and rigidity)   Person(s) Educated Patient;Spouse   Methods Explanation;Demonstration   Comprehension Verbalized understanding          OT Short Term Goals - 08/26/15 1554    OT SHORT TERM GOAL #1   Title Pt will be independent with updated PD-specific HEP for coordination, bradykinesia, rigidity.--check STGs 09/24/15   Time 4   Status New   OT SHORT TERM GOAL #2   Title Pt will improve ability to eat as shown by improving time on PPT#2 by at least 3sec with L hand.   Time 4   Period Weeks   Status New   OT SHORT TERM GOAL #3   Title Pt will improve coordination/functional reaching for ADLs as shown by improving score on box and blocks test by at least 3 with LUE.   Baseline L-49 blocks (R-59blocks)   Time 4   Period Weeks   Status New   OT SHORT TERM GOAL #4   Title Pt will verbalize understanding of memory/cognitive compensation strategies and ways to promote cognitive skills prn.   Baseline ----   Time 4   Period Weeks   Status New           OT Long Term Goals - 08/26/15 1558    OT LONG TERM GOAL #1   Title Pt will verbalize understanding of strategies/AE to increase ease with ADLs/IADLs prn.--check LTGs 10/25/15   Baseline ------------   Time 8   Period Weeks   Status New   OT LONG TERM GOAL #2   Title Pt will improve ability to eat as shown by improving time on PPT#2 by at least 6sec with L hand.   Baseline 21.81sec   Time 8   Period Weeks   Status New   OT LONG TERM GOAL #3   Title Pt will improve  functional raching/coordination for ADLs as shown by improving score by at least 6 on box and blocks test with LUE.   Baseline L-49 blocks (R-59 blocks)   Time 8   Period Weeks   Status New   OT LONG TERM GOAL #4   Title Pt will improve ease with dressing as shown by buttoning/unbuttoning 3 buttons in less than 20sec.   Baseline 22.54sec   Time 8   Period Weeks   Status New  OT LONG TERM GOAL #5   Title -----------------------               Plan - 09/05/15 1542    Clinical Impression Statement Pt is progressing towards goals with decr cueing for large amplitude movements.     Rehab Potential Good   OT Frequency 2x / week   OT Duration 8 weeks   OT Treatment/Interventions Self-care/ADL training;Therapeutic exercise;Patient/family education;Balance training;Manual Therapy;Neuromuscular education;Energy conservation;Therapeutic exercises;Therapeutic activities;DME and/or AE instruction;Parrafin;Electrical Stimulation;Fluidtherapy;Cognitive remediation/compensation;Visual/perceptual remediation/compensation;Passive range of motion;Contrast Bath;Moist Heat   Plan continue with large amplitude movements with functional tasks   OT Home Exercise Plan issued coordination HEP, PWR! hands   Consulted and Agree with Plan of Care Patient      Patient will benefit from skilled therapeutic intervention in order to improve the following deficits and impairments:  Decreased cognition, Decreased mobility, Decreased range of motion, Decreased coordination, Impaired UE functional use, Impaired perceived functional ability, Decreased knowledge of use of DME, Decreased balance, Impaired tone  Visit Diagnosis: Other symptoms and signs involving the nervous system  Other symptoms and signs involving the musculoskeletal system  Other lack of coordination  Abnormal posture    Problem List Patient Active Problem List   Diagnosis Date Noted  . Annual physical exam 07/08/2015  . BPH without  obstruction/lower urinary tract symptoms 07/08/2015  . Chronic fatigue 10/23/2014  . Chronic anxiety 10/23/2014  . Parkinson's disease ([place]) 09/20/2014  . Malnutrition ([place]) 09/20/2014  . Depression 07/26/2013  . Unsteady gait 07/26/2013  . Orthostatic hypotension 07/26/2013  . Anxiety 06/23/2013  . Chest pain 06/23/2013  . Tremor 05/18/2013  . Occlusion and stenosis of carotid artery without mention of cerebral infarction 08/30/2012    [facility] 09/05/2015, 5:05 PM  [facility] [address] [place], [place], [address] Phone: [phone]   Fax:  [phone]  Name: Joseph Arroyo MRN: [id number] Date of Birth: 01/04/1941  Vianne Bulls, OTR/L [facility] [address] [place], [place]  [address] [phone] phone [phone] 09/05/2015 5:05 PM

## 2015-09-05 NOTE — Patient Instructions (Signed)
(  Exercise) Monday Tuesday Wednesday Thursday Friday Saturday Sunday                                                                                                             

## 2015-09-06 DIAGNOSIS — R2689 Other abnormalities of gait and mobility: Secondary | ICD-10-CM | POA: Diagnosis not present

## 2015-09-06 NOTE — Therapy (Signed)
[facility] [address] [place], [place], [address] Phone: [phone]   Fax:  [phone]  Physical Therapy Treatment  Patient Details  Name: Joseph Arroyo MRN: [id number] Date of Birth: 04-Jun-1940 Referring Provider: Rexene Alberts  Encounter Date: 09/05/2015      PT End of Session - 09/06/15 1218    Visit Number 10   Number of Visits 10  per recert today to cover remaining 1-2 visits   Date for PT Re-Evaluation 09/02/15   Authorization Type [employer] STATE/G code every 10th visit   PT Start Time 1318   PT Stop Time 1400   PT Time Calculation (min) 42 min   Activity Tolerance Patient tolerated treatment well   Behavior During Therapy [facility] for tasks assessed/performed      Past Medical History  Diagnosis Date  . Ulcer   . Depression   . Occlusion and stenosis of carotid artery without mention of cerebral infarction   . Lumbago   . Thrombocytopenia, unspecified ([place])   . Other malaise and fatigue 11/21/2007  . Allergic rhinitis due to pollen 11/21/2007  . Contusion of toe 09/12/2008  . Anxiety state, unspecified 11/28/2008  . Brachial neuritis or radiculitis NOS   . Cramp of limb   . Abnormal involuntary movements(781.0)   . Acute upper respiratory infections of unspecified site   . Cervicalgia   . Conjunctivitis unspecified   . Sprain of hand, unspecified site   . Enthesopathy of unspecified site   . Pain in limb   . Essential and other specified forms of tremor   . Hip, thigh, leg, and ankle, insect bite, nonvenomous, without mention of infection(916.4)   . Chest pain, unspecified   . Sprain of neck   . Dysfunction of eustachian tube   . Costal chondritis   . Sleep disturbance, unspecified   . Contact dermatitis and other eczema due to plants (except food)   . Acute sinusitis, unspecified   . GERD (gastroesophageal reflux disease)     in past  . Seizures ([place])     age 79 - after concussion  .  Concussion     age 64 - s/p accident  . Headache     migraines - aura - no pain  . Tremors of nervous system     Parkinsonian symptoms    Past Surgical History  Procedure Laterality Date  . Other surgical history  2002    ear surgery  . Colonoscopy  2008  . Stapedes surgery Right 2002  . Cataract extraction Right 07/18/14  . Cataract extraction w/phaco Left 08/01/2014    Procedure: CATARACT EXTRACTION PHACO AND INTRAOCULAR LENS PLACEMENT (IOC);  Surgeon: Leandrew Koyanagi, MD;  Location: [facility];  Service: Ophthalmology;  Laterality: Left;  IVA TOPICAL    There were no vitals filed for this visit.      Subjective Assessment - 09/05/15 1322    Subjective Have had a busy time since last visit.  Did some walking with the poles earlier this week.   Patient is accompained by: Family member  wife, [name]   Patient Stated Goals Pt wants to return to therapy to improve low back pain and update home program.   Currently in Pain? No/denies                         [facility] Adult PT Treatment/Exercise - 09/06/15 1223    Transfers   Transfers Sit to Stand;Stand to Sit  Sit to Stand 6: Modified independent (Device/Increase time);Without upper extremity assist;From chair/3-in-1   Five time sit to stand comments  8.24   Stand to Sit 6: Modified independent (Device/Increase time);Without upper extremity assist;To chair/3-in-1   Ambulation/Gait   Ambulation/Gait Yes   Ambulation/Gait Assistance 7: Independent   Assistive device None   Gait Pattern Step-through pattern;Decreased stance time - left   Gait velocity 4.71 ft/sec   Standardized Balance Assessment   Standardized Balance Assessment Timed Up and Go Test   Timed Up and Go Test   TUG Normal TUG   Normal TUG (seconds) 8.29   High Level Balance   High Level Balance Activities Marching forwards;Backward walking  Forward walking, coordinated arms   High Level Balance Comments Alternating step taps to 6"  step, then 6"/12", 6" step x 10 reps, then side step taps at 6" step x 10 reps   Self-Care   Self-Care Other Self-Care Comments   Other Self-Care Comments  Reviewed posture/body mechanics with ADLs, with exercises, with functional activities, with pt verbalizing understanding.  Discussed progress towards goals, discussed plans for discharge and plans for return screen in 6 months following OT and speech discharge.  Provided patient with chart for exercises to help with organizing and prioritizing various exercises from OT, PT, and speech.       Neuro Re-education (continued): As review of HEP, pt performs PWR! Moves exercises in sitting and in standing (PWR! Up for posture, PWR! Rock for [employer], [place]! Step for initial transition stepping) x 10 reps each, with pt performing independently.  Pt also performs sit<>stand with PWR! Up x 10 reps with best upright posture.          PT Education - 09/06/15 1217    Education provided Yes   Education Details Exercise chart for HEP organization, progress towards goals, plans for D/C   Person(s) Educated Patient;Spouse   Methods Explanation;Handout   Comprehension Verbalized understanding          PT Short Term Goals - 07/31/15 1115    PT SHORT TERM GOAL #1   Title Pt will be independent with HEP to address balance, gait, posture, back pain.  TARGET 08/03/15   Time 4   Period Weeks   Status Achieved   PT SHORT TERM GOAL #2   Title Pt will verbalize at least 3 means to decrease back pain.   Baseline lying down, stretches, icing low back helps pain, positioning   Time 4   Period Weeks   Status Achieved   PT SHORT TERM GOAL #3   Title Pt will improve 4-square step test to less than or equal to 12 seconds for improved dynamic balance.   Time 4   Period Weeks   Status Not Met   PT SHORT TERM GOAL #4   Title Pt will perform at least 8 of 10 reps of sit<>stand transfers from lower than 18" surfaces, without UE support, with no posterior  lean.   Time 4   Period Weeks   Status Achieved           PT Long Term Goals - 09/05/15 1324    PT LONG TERM GOAL #1   Title Pt will improve tandem stance to at least 10 seconds, each position, for improved standing balance and stability.  TARGET 09/02/15   Time 8   Period Weeks   Status Achieved   PT LONG TERM GOAL #2   Title Pt will improve single limb stance to at least  3 seconds each leg, for improved obstacle and stair negotiation.   Time 8   Period Weeks   Status Achieved   PT LONG TERM GOAL #3   Title Pt will ambulate at least 1000 ft, using bilateral walking poles, modified independently, without complaints of back pain, for improved outdoor gait.   Time 8   Period Weeks   Status Achieved   PT LONG TERM GOAL #4   Title Pt will verbalize/demo understanding of posture and positioning with ADLs to decrease back discomfort.  TARGET 09/20/15   Time 2   Period Weeks   Status Achieved               Plan - 07-Sep-2015 1218    Clinical Impression Statement Pt has met all LTGs.  Overall, pt appears to be complaining of less back pain and has improved gait pattern and functional mobility.  Pt has made good progress with PT activities and carryover of walking for exercise tasks with walking poles.  Pt is appropriate for discharge at this time.   Rehab Potential Good   PT Frequency 1x / week   PT Duration 2 weeks  additional weeks-see recert   PT Treatment/Interventions ADLs/Self Care Home Management;Therapeutic exercise;Therapeutic activities;Functional mobility training;Gait training;Patient/family education;DME Instruction;Neuromuscular re-education;Balance training;Manual techniques   PT Next Visit Plan Discharge this visit.   Consulted and Agree with Plan of Care Patient;Family member/caregiver   Family Member Consulted wife      Patient will benefit from skilled therapeutic intervention in order to improve the following deficits and impairments:  Abnormal gait,  Decreased balance, Decreased endurance, Difficulty walking, Impaired flexibility, Postural dysfunction, Decreased activity tolerance, Decreased mobility  Visit Diagnosis: Other abnormalities of gait and mobility  Abnormal posture       G-Codes - 2015-09-07 1222    Functional Assessment Tool Used SLS >3 sec bilaterally, TUG 8.29 sec, 5x sit<>stand 8.24 sec   Functional Limitation Mobility: Walking and moving around   Mobility: Walking and Moving Around Goal Status [phone]) At least 1 percent but less than 20 percent impaired, limited or restricted   Mobility: Walking and Moving Around Discharge Status [id number]) At least 1 percent but less than 20 percent impaired, limited or restricted      Problem List Patient Active Problem List   Diagnosis Date Noted  . Annual physical exam 07/08/2015  . BPH without obstruction/lower urinary tract symptoms 07/08/2015  . Chronic fatigue 10/23/2014  . Chronic anxiety 10/23/2014  . Parkinson's disease ([place]) 09/20/2014  . Malnutrition ([place]) 09/20/2014  . Depression 07/26/2013  . Unsteady gait 07/26/2013  . Orthostatic hypotension 07/26/2013  . Anxiety 06/23/2013  . Chest pain 06/23/2013  . Tremor 05/18/2013  . Occlusion and stenosis of carotid artery without mention of cerebral infarction 08/30/2012    Nicklous Aburto W. 09-07-15, 12:24 PM  Frazier Butt., PT  [facility] [address] [place], [place], [address] Phone: [phone]   Fax:  [phone]  Name: Joseph Arroyo MRN: [id number] Date of Birth: 04-Jun-1940   PHYSICAL THERAPY DISCHARGE SUMMARY  Visits from Start of Care: 10  Current functional level related to goals / functional outcomes:     PT Long Term Goals - 09/05/15 1324    PT LONG TERM GOAL #1   Title Pt will improve tandem stance to at least 10 seconds, each position, for improved standing balance and stability.  TARGET 09/02/15   Time 8   Period Weeks    Status Achieved  PT LONG TERM GOAL #2   Title Pt will improve single limb stance to at least 3 seconds each leg, for improved obstacle and stair negotiation.   Time 8   Period Weeks   Status Achieved   PT LONG TERM GOAL #3   Title Pt will ambulate at least 1000 ft, using bilateral walking poles, modified independently, without complaints of back pain, for improved outdoor gait.   Time 8   Period Weeks   Status Achieved   PT LONG TERM GOAL #4   Title Pt will verbalize/demo understanding of posture and positioning with ADLs to decrease back discomfort.  TARGET 09/20/15   Time 2   Period Weeks   Status Achieved    Pt has met all long term goals.   Remaining deficits: Posture, timing and coordination of gait (improving)   Education / Equipment: Educated in [employer], exercise chart for prioritizing exercises as part of HEP  Plan: Patient agrees to discharge.  Patient goals were met. Patient is being discharged due to meeting the stated rehab goals.  ?????Plan to return for PT screen in 6-9 months.   Mady Haagensen, PT 09/06/2015 12:27 PM Phone: [phone] Fax: [phone]

## 2015-09-09 ENCOUNTER — Ambulatory Visit
Admission: RE | Admit: 2015-09-09 | Discharge: 2015-09-09 | Disposition: A | Discharge status: Home / Self Care | Payer: Medicare Other | Source: Ambulatory Visit | Attending: Neurology | Admitting: Neurology

## 2015-09-09 DIAGNOSIS — R1310 Dysphagia, unspecified: Secondary | ICD-10-CM | POA: Insufficient documentation

## 2015-09-09 DIAGNOSIS — R1312 Dysphagia, oropharyngeal phase: Secondary | ICD-10-CM

## 2015-09-09 NOTE — Progress Notes (Signed)
Quick Note:  Swallow study showed benign results; please notify patient or wife, thx Star Age, MD, PhD [facility] ([place])  ______

## 2015-09-09 NOTE — Therapy (Signed)
[facility] [place], [place], [address] Phone: [phone]   Fax:     Modified Barium Swallow  Patient Details  Name: Joseph Arroyo MRN: [id number] Date of Birth: 1941/01/24 Referring Provider: Dr. Star Age  Encounter Date: 09/09/2015      End of Session - 09/09/15 1403    Visit Number 1   Number of Visits 1   Date for SLP Re-Evaluation 09/09/15   SLP Start Time 22   SLP Stop Time  [id number]   SLP Time Calculation (min) 48 min   Activity Tolerance Patient tolerated treatment well      Past Medical History  Diagnosis Date  . Ulcer   . Depression   . Occlusion and stenosis of carotid artery without mention of cerebral infarction   . Lumbago   . Thrombocytopenia, unspecified ([place])   . Other malaise and fatigue 11/21/2007  . Allergic rhinitis due to pollen 11/21/2007  . Contusion of toe 09/12/2008  . Anxiety state, unspecified 11/28/2008  . Brachial neuritis or radiculitis NOS   . Cramp of limb   . Abnormal involuntary movements(781.0)   . Acute upper respiratory infections of unspecified site   . Cervicalgia   . Conjunctivitis unspecified   . Sprain of hand, unspecified site   . Enthesopathy of unspecified site   . Pain in limb   . Essential and other specified forms of tremor   . Hip, thigh, leg, and ankle, insect bite, nonvenomous, without mention of infection(916.4)   . Chest pain, unspecified   . Sprain of neck   . Dysfunction of eustachian tube   . Costal chondritis   . Sleep disturbance, unspecified   . Contact dermatitis and other eczema due to plants (except food)   . Acute sinusitis, unspecified   . GERD (gastroesophageal reflux disease)     in past  . Seizures ([place])     age 22 - after concussion  . Concussion     age 37 - s/p accident  . Headache     migraines - aura - no pain  . Tremors of nervous system     Parkinsonian symptoms    Past Surgical History   Procedure Laterality Date  . Other surgical history  2002    ear surgery  . Colonoscopy  2008  . Stapedes surgery Right 2002  . Cataract extraction Right 07/18/14  . Cataract extraction w/phaco Left 08/01/2014    Procedure: CATARACT EXTRACTION PHACO AND INTRAOCULAR LENS PLACEMENT (IOC);  Surgeon: Leandrew Koyanagi, MD;  Location: [facility];  Service: Ophthalmology;  Laterality: Left;  IVA TOPICAL    There were no vitals filed for this visit.   Subjective: Patient behavior: (alertness, ability to follow instructions, etc.): Patient able to express his swallowing history and follow directions.  Chief complaint: Per patient, some difficulty with pills and nuts   Objective:  Radiological Procedure: A videoflouroscopic evaluation of oral-preparatory, reflex initiation, and pharyngeal phases of the swallow was performed; as well as a screening of the upper esophageal phase.  I. POSTURE: Upright in MBS chair  II. VIEW: Lateral  III. COMPENSATORY STRATEGIES: N/A  IV. BOLUSES ADMINISTERED:   Thin Liquid: 2 small sips, 3 rapid, consecutive sips   Nectar-thick Liquid: 1 moderate size bolus    Puree: 2 teaspoon presentations   Mechanical Soft: Chex cereal in applesauce  V. RESULTS OF EVALUATION: A. ORAL PREPARATORY PHASE: (The lips, tongue, and velum are observed for strength and  coordination)       **Overall Severity Rating: Mild; disorganized posterior transfer, decreased bolus cohesion,   B. SWALLOW INITIATION/REFLEX: (The reflex is normal if "triggered" by the time the bolus reached the base of the tongue)  **Overall Severity Rating: Mild; triggers at the valleculae  C. PHARYNGEAL PHASE: (Pharyngeal function is normal if the bolus shows rapid, smooth, and continuous transit through the pharynx and there is no pharyngeal residue after the swallow)  **Overall Severity Rating: Mild-moderate; decreased tongue base retraction, incomplete epiglottic inversion; mild-moderate  vallecular residue; trace posterior pharyngeal wall and pyriform sinus residue  D. LARYNGEAL PENETRATION: (Material entering into the laryngeal inlet/vestibule but not aspirated) None  E. ASPIRATION: None  F. ESOPHAGEAL PHASE: (Screening of the upper esophagus) In the cervical esophagus there is a finger-like protrusion along the posterior wall during swallow (does not impede flow of boluses) consistent with prominent cricopharyngeus.    ASSESSMENT: This 75 year old man; with Parkinson's disease; is presenting with mild oropharyngeal dysphagia characterized by slow / disorganized oral management, delayed pharyngeal swallow initiation, decreased tongue base retraction, and incomplete epiglottic inversion (with mild-moderate vallecular residue and trace residue along the posterior pharyngeal wall pyriform sinuses).  There is no observed laryngeal penetration or aspiration across all tested consistencies.  The patient is not currently at significant risk for prandial aspiration.  He will benefit from continuing speech therapy to address communication and swallowing per established plan.  PLAN/RECOMMENDATIONS:   A. Diet: Continue usual diet   B. Swallowing Precautions: Per treating SLP   C. Recommended consultation to: N/A   D. Therapy recommendations: continue speech therapy   E. Results and recommendations were discussed with the patient immediately following the study and final report routed to the referring MD.                                                            Patient will benefit from skilled therapeutic intervention in order to improve the following deficits and impairments:   Dysphagia, oropharyngeal phase - Plan: SLP modified barium swallow, SLP modified barium swallow  Dysphagia - Plan: DG OP Swallowing Func-Medicare/Speech Path, DG OP Swallowing Func-Medicare/Speech Path      G-Codes - 09-25-15 1404    Functional Assessment Tool Used MBS, clinical  judgment   Functional Limitations Swallowing   Swallow Current Status [id number]) At least 20 percent but less than 40 percent impaired, limited or restricted   Swallow Goal Status [id number]) At least 20 percent but less than 40 percent impaired, limited or restricted   Swallow Discharge Status [phone]) At least 20 percent but less than 40 percent impaired, limited or restricted          Problem List Patient Active Problem List   Diagnosis Date Noted  . Annual physical exam 07/08/2015  . BPH without obstruction/lower urinary tract symptoms 07/08/2015  . Chronic fatigue 10/23/2014  . Chronic anxiety 10/23/2014  . Parkinson's disease ([place]) 09/20/2014  . Malnutrition ([place]) 09/20/2014  . Depression 07/26/2013  . Unsteady gait 07/26/2013  . Orthostatic hypotension 07/26/2013  . Anxiety 06/23/2013  . Chest pain 06/23/2013  . Tremor 05/18/2013  . Occlusion and stenosis of carotid artery without mention of cerebral infarction 08/30/2012   Leroy Sea, MS/CCC- SLP  Lou Miner September 25, 2015, 2:05 PM  [facility]  [place], [place], [address] Phone: [phone]   Fax:     Name: Joseph Arroyo MRN: [id number] Date of Birth: [birth year]

## 2015-09-10 ENCOUNTER — Ambulatory Visit: Payer: Medicare Other | Admitting: Occupational Therapy

## 2015-09-10 DIAGNOSIS — R278 Other lack of coordination: Secondary | ICD-10-CM

## 2015-09-10 DIAGNOSIS — R29898 Other symptoms and signs involving the musculoskeletal system: Secondary | ICD-10-CM

## 2015-09-10 DIAGNOSIS — R293 Abnormal posture: Secondary | ICD-10-CM

## 2015-09-10 DIAGNOSIS — R29818 Other symptoms and signs involving the nervous system: Secondary | ICD-10-CM

## 2015-09-10 DIAGNOSIS — R2689 Other abnormalities of gait and mobility: Secondary | ICD-10-CM | POA: Diagnosis not present
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
## 2015-09-11 ENCOUNTER — Telehealth: Payer: Self-pay

## 2015-09-11 NOTE — Telephone Encounter (Signed)
I spoke to wife and she is aware of results.

## 2015-09-11 NOTE — Telephone Encounter (Signed)
-----   Message from Star Age, MD sent at 09/09/2015  4:57 PM EDT ----- Swallow study showed benign results; please notify patient or wife, thx Star Age, MD, PhD [facility] ([address])

## 2015-09-12 ENCOUNTER — Ambulatory Visit: Payer: Medicare Other

## 2015-09-12 ENCOUNTER — Ambulatory Visit: Payer: Medicare Other | Admitting: Occupational Therapy

## 2015-09-12 DIAGNOSIS — R29818 Other symptoms and signs involving the nervous system: Secondary | ICD-10-CM

## 2015-09-12 DIAGNOSIS — R1310 Dysphagia, unspecified: Secondary | ICD-10-CM

## 2015-09-12 DIAGNOSIS — R293 Abnormal posture: Secondary | ICD-10-CM

## 2015-09-12 DIAGNOSIS — R278 Other lack of coordination: Secondary | ICD-10-CM

## 2015-09-12 DIAGNOSIS — R2689 Other abnormalities of gait and mobility: Secondary | ICD-10-CM | POA: Diagnosis not present

## 2015-09-12 DIAGNOSIS — R29898 Other symptoms and signs involving the musculoskeletal system: Secondary | ICD-10-CM

## 2015-09-12 DIAGNOSIS — R471 Dysarthria and anarthria: Secondary | ICD-10-CM
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
## 2015-09-12 NOTE — Patient Instructions (Signed)
  Do each of these two to three times per day:  1)  Hard swallow 20 times  2)  Put your tongue out of your mouth past your lips, hold it with your teeth and swallow 20 times
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
## 2015-09-16 ENCOUNTER — Encounter: Payer: Self-pay | Admitting: *Deleted

## 2015-09-18 ENCOUNTER — Ambulatory Visit: Payer: Medicare Other | Admitting: Occupational Therapy

## 2015-09-18 ENCOUNTER — Ambulatory Visit: Payer: Medicare Other

## 2015-09-18 DIAGNOSIS — R29898 Other symptoms and signs involving the musculoskeletal system: Secondary | ICD-10-CM

## 2015-09-18 DIAGNOSIS — R2689 Other abnormalities of gait and mobility: Secondary | ICD-10-CM | POA: Diagnosis not present

## 2015-09-18 DIAGNOSIS — R471 Dysarthria and anarthria: Secondary | ICD-10-CM

## 2015-09-18 DIAGNOSIS — R1310 Dysphagia, unspecified: Secondary | ICD-10-CM

## 2015-09-18 DIAGNOSIS — R29818 Other symptoms and signs involving the nervous system: Secondary | ICD-10-CM

## 2015-09-18 DIAGNOSIS — R293 Abnormal posture: Secondary | ICD-10-CM

## 2015-09-18 NOTE — Therapy (Signed)
[facility] [address] [place], [place], [address] Phone: [phone]   Fax:  [phone]  Occupational Therapy Treatment  Patient Details  Name: Joseph Arroyo MRN: [id number] Date of Birth: [birth year] Referring Provider: Dr. Star Age  Encounter Date: 09/18/2015      OT End of Session - 09/18/15 1621    Visit Number 6   Number of Visits 17   Date for OT Re-Evaluation 10/27/15   Authorization Type [employer] State, no auth req, no visit limit, G-code   Authorization - Visit Number 6   Authorization - Number of Visits 10   OT Start Time 1503   OT Stop Time 1558   OT Time Calculation (min) 55 min   Activity Tolerance Patient tolerated treatment well   Behavior During Therapy [facility] for tasks assessed/performed      Past Medical History  Diagnosis Date  . Ulcer   . Depression   . Occlusion and stenosis of carotid artery without mention of cerebral infarction   . Lumbago   . Thrombocytopenia, unspecified ([place])   . Other malaise and fatigue 11/21/2007  . Allergic rhinitis due to pollen 11/21/2007  . Contusion of toe 09/12/2008  . Anxiety state, unspecified 11/28/2008  . Brachial neuritis or radiculitis NOS   . Cramp of limb   . Abnormal involuntary movements(781.0)   . Acute upper respiratory infections of unspecified site   . Cervicalgia   . Conjunctivitis unspecified   . Sprain of hand, unspecified site   . Enthesopathy of unspecified site   . Pain in limb   . Essential and other specified forms of tremor   . Hip, thigh, leg, and ankle, insect bite, nonvenomous, without mention of infection(916.4)   . Chest pain, unspecified   . Sprain of neck   . Dysfunction of eustachian tube   . Costal chondritis   . Sleep disturbance, unspecified   . Contact dermatitis and other eczema due to plants (except food)   . Acute sinusitis, unspecified   . GERD (gastroesophageal reflux disease)     in past  . Seizures  ([place])     age 53 - after concussion  . Concussion     age 67 - s/p accident  . Headache     migraines - aura - no pain  . Tremors of nervous system     Parkinsonian symptoms    Past Surgical History  Procedure Laterality Date  . Other surgical history  2002    ear surgery  . Colonoscopy  2008  . Stapedes surgery Right 2002  . Cataract extraction Right 07/18/14  . Cataract extraction w/phaco Left 08/01/2014    Procedure: CATARACT EXTRACTION PHACO AND INTRAOCULAR LENS PLACEMENT (IOC);  Surgeon: Leandrew Koyanagi, MD;  Location: [facility];  Service: Ophthalmology;  Laterality: Left;  IVA TOPICAL    There were no vitals filed for this visit.      Subjective Assessment - 09/18/15 1503    Patient is accompained by: Family member   Patient Stated Goals ride his motorcycle, be active   Currently in Pain? No/denies                         [facility]) - 09/18/15 1724    PWR! exercises Moves in supine;Moves in prone   PWR! Up x20   PWR! Rock [employer]! Twist x20   Comments min v.c. and demonstration   PWR! Rock performed  in supine not on elbows but discontinued due to back pain   PWR! Twist 20   Comments min v.c. and demonstration             OT Education - 09/18/15 1725    Education provided Yes   Education Details memory compensation strategies, keeping thinking skills sharp and recommendation that pt does not use incline board for stretching due to possible decline in BP., HEP review PWR! supine/ prone from last visit   Person(s) Educated Patient;Spouse;Child(ren)   Methods Explanation;Demonstration;Verbal cues;Handout   Comprehension Verbalized understanding;Returned demonstration;Verbal cues required          OT Short Term Goals - 08/26/15 1554    OT SHORT TERM GOAL #1   Title Pt will be independent with updated PD-specific HEP for coordination, bradykinesia, rigidity.--check STGs 09/24/15   Time 4   Status New   OT SHORT TERM GOAL #2    Title Pt will improve ability to eat as shown by improving time on PPT#2 by at least 3sec with L hand.   Time 4   Period Weeks   Status New   OT SHORT TERM GOAL #3   Title Pt will improve coordination/functional reaching for ADLs as shown by improving score on box and blocks test by at least 3 with LUE.   Baseline L-49 blocks (R-59blocks)   Time 4   Period Weeks   Status New   OT SHORT TERM GOAL #4   Title Pt will verbalize understanding of memory/cognitive compensation strategies and ways to promote cognitive skills prn.   Baseline ----   Time 4   Period Weeks   Status New           OT Long Term Goals - 08/26/15 1558    OT LONG TERM GOAL #1   Title Pt will verbalize understanding of strategies/AE to increase ease with ADLs/IADLs prn.--check LTGs 10/25/15   Baseline ------------   Time 8   Period Weeks   Status New   OT LONG TERM GOAL #2   Title Pt will improve ability to eat as shown by improving time on PPT#2 by at least 6sec with L hand.   Baseline 21.81sec   Time 8   Period Weeks   Status New   OT LONG TERM GOAL #3   Title Pt will improve functional raching/coordination for ADLs as shown by improving score by at least 6 on box and blocks test with LUE.   Baseline L-49 blocks (R-59 blocks)   Time 8   Period Weeks   Status New   OT LONG TERM GOAL #4   Title Pt will improve ease with dressing as shown by buttoning/unbuttoning 3 buttons in less than 20sec.   Baseline 22.54sec   Time 8   Period Weeks   Status New   OT LONG TERM GOAL #5   Title -----------------------               Plan - 09/18/15 1614    Clinical Impression Statement Pt returned demonstration of supine PWR! exercises issued last visit. Pt's daughter was present and verbalized understanding of exercises as well.   Rehab Potential Good   OT Frequency 2x / week   OT Duration 8 weeks   OT Treatment/Interventions Self-care/ADL training;Therapeutic exercise;Patient/family education;Balance  training;Manual Therapy;Neuromuscular education;Energy conservation;Therapeutic exercises;Therapeutic activities;DME and/or AE instruction;Parrafin;Electrical Stimulation;Fluidtherapy;Cognitive remediation/compensation;Visual/perceptual remediation/compensation;Passive range of motion;Contrast Bath;Moist Heat   Plan large amplitude movements for functional tasks/ ADLs.   OT Home Exercise Plan issued coordination  HEP, PWR! hands, supine PWR! up, rock, and twist, and prone twist   Consulted and Agree with Plan of Care Patient      Patient will benefit from skilled therapeutic intervention in order to improve the following deficits and impairments:  Decreased cognition, Decreased mobility, Decreased range of motion, Decreased coordination, Impaired UE functional use, Impaired perceived functional ability, Decreased knowledge of use of DME, Decreased balance, Impaired tone  Visit Diagnosis: Other symptoms and signs involving the musculoskeletal system  Other symptoms and signs involving the nervous system  Abnormal posture  Other abnormalities of gait and mobility    Problem List Patient Active Problem List   Diagnosis Date Noted  . Annual physical exam 07/08/2015  . BPH without obstruction/lower urinary tract symptoms 07/08/2015  . Chronic fatigue 10/23/2014  . Chronic anxiety 10/23/2014  . Parkinson's disease ([place]) 09/20/2014  . Malnutrition ([place]) 09/20/2014  . Depression 07/26/2013  . Unsteady gait 07/26/2013  . Orthostatic hypotension 07/26/2013  . Anxiety 06/23/2013  . Chest pain 06/23/2013  . Tremor 05/18/2013  . Occlusion and stenosis of carotid artery without mention of cerebral infarction 08/30/2012    Riot Barrick 09/18/2015, 5:28 PM Theone Murdoch, OTR/L [phone] Phone: [phone] 5:28 PM 09/18/2015 [place] [address] [place], [place], [address] Phone: [phone]   Fax:   [phone]  Name: Joseph Arroyo MRN: [id number] Date of Birth: [birth year]

## 2015-09-18 NOTE — Patient Instructions (Signed)
Take 1/2 teaspoon water and hold it in your mouth for 10 seconds, then swallow hard  Repeat 20 times, 2-3 times a day  Swallow hard, but HOLD the swallow for 5-7 seconds,  Repeat 15-20 times, 2-3 times a day *wet spoon is ok*

## 2015-09-18 NOTE — Patient Instructions (Signed)
Memory Compensation Strategies  1. Use "WARM" strategy.  W= write it down  A= associate it  R= repeat it  M= make a mental note  2.   You can keep a Memory Notebook.  Use a 3-ring notebook with sections for the following: calendar, important names and phone numbers,  medications, doctors' names/phone numbers, lists/reminders, and a section to journal what you did  each day.   3.    Use a calendar to write appointments down.  4.    Write yourself a schedule for the day.  This can be placed on the calendar or in a separate section of the Memory Notebook.  Keeping a  regular schedule can help memory.  5.    Use medication organizer with sections for each day or morning/evening pills.  You may need help loading it  6.    Keep a basket, or pegboard by the door.  Place items that you need to take out with you in the basket or on the pegboard.  You may also want to  include a message board for reminders.  7.    Use sticky notes.  Place sticky notes with reminders in a place where the task is performed.  For example: " turn off the  stove" placed by the stove, "lock the door" placed on the door at eye level, " take your medications" on  the bathroom mirror or by the place where you normally take your medications.  8.    Use alarms/timers.  Use while cooking to remind yourself to check on food or as a reminder to take your medicine, or as a  reminder to make a call, or as a reminder to perform another task, etc.    Keeping Thinking Skills Sharp: 1. Jigsaw puzzles 2. Card/board games 3. Talking on the phone/social events 4. Lum[employer].com 5. Online games 6. Word serches/crossword puzzles 7.  Logic puzzles 8. Aerobic exercise (stationary bike) 9. Eating balanced diet (fruits & veggies) 10. Drink water 11. Try something new--new recipe, hobby 12. Crafts 13. Do a variety of activities that are challenging 14. Add cognitive activities to walking/exercising (think of animal/food/city with  each letter of the alphabet, counting backwards, thinking of as many vegetables as you can, etc.).--Only do this  If safe (no freezing/falls).   

## 2015-09-19 ENCOUNTER — Ambulatory Visit: Payer: Medicare Other | Admitting: Occupational Therapy

## 2015-09-19 ENCOUNTER — Ambulatory Visit: Payer: Medicare Other

## 2015-09-19 DIAGNOSIS — R471 Dysarthria and anarthria: Secondary | ICD-10-CM

## 2015-09-19 DIAGNOSIS — R29898 Other symptoms and signs involving the musculoskeletal system: Secondary | ICD-10-CM

## 2015-09-19 DIAGNOSIS — R278 Other lack of coordination: Secondary | ICD-10-CM

## 2015-09-19 DIAGNOSIS — R1310 Dysphagia, unspecified: Secondary | ICD-10-CM

## 2015-09-19 DIAGNOSIS — R29818 Other symptoms and signs involving the nervous system: Secondary | ICD-10-CM

## 2015-09-19 DIAGNOSIS — R2689 Other abnormalities of gait and mobility: Secondary | ICD-10-CM

## 2015-09-19 DIAGNOSIS — R293 Abnormal posture: Secondary | ICD-10-CM

## 2015-09-19 NOTE — Therapy (Signed)
[facility] [address] [place], [place], [address] Phone: [phone]   Fax:  [phone]  Speech Language Pathology Treatment  Patient Details  Name: Joseph Arroyo MRN: [id number] Date of Birth: [birth year] Referring Provider: Dr. Star Age  Encounter Date: 09/19/2015      End of Session - 09/19/15 1314    Visit Number 7   Number of Visits 17   Date for SLP Re-Evaluation 10/17/15   SLP Start Time 19   SLP Stop Time  1100   SLP Time Calculation (min) 41 min   Activity Tolerance Patient tolerated treatment well      Past Medical History  Diagnosis Date  . Ulcer   . Depression   . Occlusion and stenosis of carotid artery without mention of cerebral infarction   . Lumbago   . Thrombocytopenia, unspecified ([place])   . Other malaise and fatigue 11/21/2007  . Allergic rhinitis due to pollen 11/21/2007  . Contusion of toe 09/12/2008  . Anxiety state, unspecified 11/28/2008  . Brachial neuritis or radiculitis NOS   . Cramp of limb   . Abnormal involuntary movements(781.0)   . Acute upper respiratory infections of unspecified site   . Cervicalgia   . Conjunctivitis unspecified   . Sprain of hand, unspecified site   . Enthesopathy of unspecified site   . Pain in limb   . Essential and other specified forms of tremor   . Hip, thigh, leg, and ankle, insect bite, nonvenomous, without mention of infection(916.4)   . Chest pain, unspecified   . Sprain of neck   . Dysfunction of eustachian tube   . Costal chondritis   . Sleep disturbance, unspecified   . Contact dermatitis and other eczema due to plants (except food)   . Acute sinusitis, unspecified   . GERD (gastroesophageal reflux disease)     in past  . Seizures ([place])     age 71 - after concussion  . Concussion     age 35 - s/p accident  . Headache     migraines - aura - no pain  . Tremors of nervous system     Parkinsonian symptoms    Past  Surgical History  Procedure Laterality Date  . Other surgical history  2002    ear surgery  . Colonoscopy  2008  . Stapedes surgery Right 2002  . Cataract extraction Right 07/18/14  . Cataract extraction w/phaco Left 08/01/2014    Procedure: CATARACT EXTRACTION PHACO AND INTRAOCULAR LENS PLACEMENT (IOC);  Surgeon: Leandrew Koyanagi, MD;  Location: [facility];  Service: Ophthalmology;  Laterality: Left;  IVA TOPICAL    There were no vitals filed for this visit.      Subjective Assessment - 09/19/15 1023    Subjective Pt was consistent with speech loudness today as yesterday.   Patient is accompained by: --  wife   Currently in Pain? No/denies               ADULT SLP TREATMENT - 09/19/15 1037    General Information   Behavior/Cognition Alert;Cooperative;Pleasant mood   Treatment Provided   Treatment provided Dysphagia;Cognitive-Linquistic   Dysphagia Treatment   Other treatment/comments SLp reviewed and educated pt re: Caryl Ada. After approx 10 minutes pt was more successful with procedure of this exercise and demonstrated it 80% accuracy.   Cognitive-Linquistic Treatment   Treatment focused on Dysarthria   Skilled Treatment SLP facilitated WNL conversational loudness with loud /a/ with average 90dB. SLP told  pt to keep tongue resting in his oral bed between his lower teeth instead of retroflexing posteriorly. Pt was more successful after SLP explanation but req'd min A occasionally for resting tongue. Mod complex conversation of 13 minutes outdoors was 100% intelligible.     Assessment / Recommendations / Plan   Plan Continue with current plan of care   Progression Toward Goals   Progression toward goals Progressing toward goals          SLP Education - 09/19/15 1314    Education provided Yes   Education Details mendelsohn/HEP   Person(s) Educated Patient;Spouse   Methods Explanation;Demonstration;Verbal cues   Comprehension Verbalized  understanding;Returned demonstration          SLP Short Term Goals - 09/12/15 1534    SLP SHORT TERM GOAL #1   Title Pt will average 70dB at sentence level during structured speech tasks with rare min A   Status Achieved   SLP SHORT TERM GOAL #2   Title pt will demo simple conversation of 10 minutes at 70dB over 2 sessions   Baseline --   Time --   Period --   Status Achieved   SLP SHORT TERM GOAL #3   Title Pt will participate in objective swallowing assessment with goals to follow pending MBSS   Time --   Period --   Status Achieved   SLP SHORT TERM GOAL #4   Title pt will complete HEP for dysphagia with rare min A   Time 2   Period Weeks   Status New          SLP Long Term Goals - 09/19/15 1316    SLP LONG TERM GOAL #1   Title pt will demo loudness of 70dB in 15 minutes mod complex conversation during therapy, outside ST room, in 2 sessions   Time 5   Period Weeks   Status Revised   SLP LONG TERM GOAL #2   Title report decr of symptoms of dysphagia when engaging in compensatory strategies at home   Time 5   Period Weeks   Status On-going   SLP LONG TERM GOAL #3   Title pt will demo HEP for dysphagia with modified independence over two sessions   Time 5   Period Weeks   Status New          Plan - 09/19/15 1315    Clinical Impression Statement [name] demonstrated intelligibility of 100% in mod complex conversation of 13 minutes today outdoors. His performance with Caryl Ada was improved today. Pt would beneift from cont ST to address consistency of loudness in conversation as well as swallowing abilities.    Speech Therapy Frequency 2x / week   Duration 4 weeks   Treatment/Interventions Pharyngeal strengthening exercises;Aspiration precaution training;Diet toleration management by SLP;Compensatory techniques;Trials of upgraded texture/liquids;SLP instruction and feedback;Compensatory strategies;Internal/external aids;Environmental controls;Oral motor  exercises;Patient/family education;Cueing hierarchy;Functional tasks   Potential to Achieve Goals Good   Consulted and Agree with Plan of Care Patient;Family member/caregiver   Family Member Consulted spouse      Patient will benefit from skilled therapeutic intervention in order to improve the following deficits and impairments:   Dysphagia  Dysarthria and anarthria    Problem List Patient Active Problem List   Diagnosis Date Noted  . Annual physical exam 07/08/2015  . BPH without obstruction/lower urinary tract symptoms 07/08/2015  . Chronic fatigue 10/23/2014  . Chronic anxiety 10/23/2014  . Parkinson's disease ([place]) 09/20/2014  . Malnutrition ([place]) 09/20/2014  .  Depression 07/26/2013  . Unsteady gait 07/26/2013  . Orthostatic hypotension 07/26/2013  . Anxiety 06/23/2013  . Chest pain 06/23/2013  . Tremor 05/18/2013  . Occlusion and stenosis of carotid artery without mention of cerebral infarction 08/30/2012    [facility] ,MS, [place]  09/19/2015, 1:17 PM  [place] [address] [place], [place], [address] Phone: [phone]   Fax:  [phone]   Name: [facility] Arroyo MRN: [id number] Date of Birth: [birth year]

## 2015-09-19 NOTE — Therapy (Signed)
[facility] [address] [place], [place], [address] Phone: [phone]   Fax:  [phone]  Speech Language Pathology Treatment  Patient Details  Name: [facility] Arroyo MRN: [id number] Date of Birth: Apr 06, 1940 Referring Provider: Dr. Star Age  Encounter Date: 09/18/2015      End of Session - 09/18/15 1453    Visit Number 6   Number of Visits 17   Date for SLP Re-Evaluation 10/17/15   SLP Start Time 1404   SLP Stop Time  1445   SLP Time Calculation (min) 41 min   Activity Tolerance Patient tolerated treatment well      Past Medical History  Diagnosis Date  . Ulcer   . Depression   . Occlusion and stenosis of carotid artery without mention of cerebral infarction   . Lumbago   . Thrombocytopenia, unspecified ([place])   . Other malaise and fatigue 11/21/2007  . Allergic rhinitis due to pollen 11/21/2007  . Contusion of toe 09/12/2008  . Anxiety state, unspecified 11/28/2008  . Brachial neuritis or radiculitis NOS   . Cramp of limb   . Abnormal involuntary movements(781.0)   . Acute upper respiratory infections of unspecified site   . Cervicalgia   . Conjunctivitis unspecified   . Sprain of hand, unspecified site   . Enthesopathy of unspecified site   . Pain in limb   . Essential and other specified forms of tremor   . Hip, thigh, leg, and ankle, insect bite, nonvenomous, without mention of infection(916.4)   . Chest pain, unspecified   . Sprain of neck   . Dysfunction of eustachian tube   . Costal chondritis   . Sleep disturbance, unspecified   . Contact dermatitis and other eczema due to plants (except food)   . Acute sinusitis, unspecified   . GERD (gastroesophageal reflux disease)     in past  . Seizures ([place])     age 42 - after concussion  . Concussion     age 69 - s/p accident  . Headache     migraines - aura - no pain  . Tremors of nervous system     Parkinsonian symptoms    Past  Surgical History  Procedure Laterality Date  . Other surgical history  2002    ear surgery  . Colonoscopy  2008  . Stapedes surgery Right 2002  . Cataract extraction Right 07/18/14  . Cataract extraction w/phaco Left 08/01/2014    Procedure: CATARACT EXTRACTION PHACO AND INTRAOCULAR LENS PLACEMENT (IOC);  Surgeon: Leandrew Koyanagi, MD;  Location: [facility];  Service: Ophthalmology;  Laterality: Left;  IVA TOPICAL    There were no vitals filed for this visit.      Subjective Assessment - 09/19/15 1023    Subjective Pt was consistent with speech loudness today as yesterday.   Patient is accompained by: --  wife   Currently in Pain? No/denies               ADULT SLP TREATMENT - 09/19/15 1033    General Information   Behavior/Cognition Alert;Cooperative;Pleasant mood   Dysphagia Treatment   Other treatment/comments SLP reviewed HEP with pt, and developed further HEP for him. Pt had difficulty especially with Mendelsohn.   Cognitive-Linquistic Treatment   Treatment focused on Dysarthria   Skilled Treatment SLP facilitated WNL conversational loudness with loud /a/ with average 90dB and effort level 7-8. SLP told pt to keep effort level between 7-8 instead of 8. Pt's sentence responses  were at or above 70dB average 100% of the time.  In a monologue of 8 minutes pt maintained 70dB loudness 98% of the time. Conversation of 15 minutes was also 71dB average.     Assessment / Recommendations / Plan   Plan Continue with current plan of care          SLP Education - 09/18/15 1453    Education provided Yes   Education Details HEP   Person(s) Educated Patient   Methods Explanation;Demonstration;Verbal cues;Tactile cues;Handout   Comprehension Verbalized understanding;Returned demonstration;Need further instruction          SLP Short Term Goals - 09/12/15 1534    SLP SHORT TERM GOAL #1   Title Pt will average 70dB at sentence level during structured speech tasks  with rare min A   Status Achieved   SLP SHORT TERM GOAL #2   Title pt will demo simple conversation of 10 minutes at 70dB over 2 sessions   Baseline --   Time --   Period --   Status Achieved   SLP SHORT TERM GOAL #3   Title Pt will participate in objective swallowing assessment with goals to follow pending MBSS   Time --   Period --   Status Achieved   SLP SHORT TERM GOAL #4   Title pt will complete HEP for dysphagia with rare min A   Time 2   Period Weeks   Status New          SLP Long Term Goals - 09/12/15 1655    SLP LONG TERM GOAL #1   Title pt will demo loudness of 70dB in 15 minutes mod complex conversation during therapy, outside [place] room, in 2 sessions   Time 6   Period Weeks   Status Revised   SLP LONG TERM GOAL #2   Title report decr of symptoms of dysphagia when engaging in compensatory strategies at home   Time 6   Period Weeks   Status On-going   SLP LONG TERM GOAL #3   Title pt will demo HEP for dysphagia with modified independence over two sessions   Time 6   Period Weeks   Status New        Patient will benefit from skilled therapeutic intervention in order to improve the following deficits and impairments:   Dysphagia  Dysarthria and anarthria    Problem List Patient Active Problem List   Diagnosis Date Noted  . Annual physical exam 07/08/2015  . BPH without obstruction/lower urinary tract symptoms 07/08/2015  . Chronic fatigue 10/23/2014  . Chronic anxiety 10/23/2014  . Parkinson's disease ([place]) 09/20/2014  . Malnutrition ([place]) 09/20/2014  . Depression 07/26/2013  . Unsteady gait 07/26/2013  . Orthostatic hypotension 07/26/2013  . Anxiety 06/23/2013  . Chest pain 06/23/2013  . Tremor 05/18/2013  . Occlusion and stenosis of carotid artery without mention of cerebral infarction 08/30/2012    [facility] ,MS, [place]  09/19/2015, 10:34 AM  [facility] [address] [place], [place], [address] Phone: [phone]   Fax:  [phone]   Name: [facility] Arroyo MRN: [id number] Date of Birth: 1940/06/04
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
## 2015-09-23 ENCOUNTER — Other Ambulatory Visit: Payer: Self-pay

## 2015-09-23 ENCOUNTER — Encounter: Payer: Self-pay | Admitting: General Surgery

## 2015-09-23 ENCOUNTER — Ambulatory Visit (INDEPENDENT_AMBULATORY_CARE_PROVIDER_SITE_OTHER): Payer: Medicare Other | Admitting: General Surgery

## 2015-09-23 VITALS — BP 124/68 | HR 56 | Resp 12 | Ht 68.0 in | Wt 151.0 lb

## 2015-09-23 DIAGNOSIS — I6529 Occlusion and stenosis of unspecified carotid artery: Secondary | ICD-10-CM
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
## 2015-09-23 NOTE — Patient Instructions (Addendum)
Contact office if you experience sudden weakness in arms, face.

## 2015-09-24 ENCOUNTER — Ambulatory Visit: Payer: Medicare Other | Admitting: Speech Pathology

## 2015-09-24 ENCOUNTER — Ambulatory Visit: Payer: Medicare Other | Admitting: Occupational Therapy

## 2015-09-24 DIAGNOSIS — R29818 Other symptoms and signs involving the nervous system: Secondary | ICD-10-CM

## 2015-09-24 DIAGNOSIS — R2689 Other abnormalities of gait and mobility: Secondary | ICD-10-CM | POA: Diagnosis not present

## 2015-09-24 DIAGNOSIS — R2681 Unsteadiness on feet: Secondary | ICD-10-CM

## 2015-09-24 DIAGNOSIS — R471 Dysarthria and anarthria: Secondary | ICD-10-CM

## 2015-09-24 DIAGNOSIS — R278 Other lack of coordination: Secondary | ICD-10-CM

## 2015-09-24 DIAGNOSIS — R29898 Other symptoms and signs involving the musculoskeletal system: Secondary | ICD-10-CM

## 2015-09-24 DIAGNOSIS — R1310 Dysphagia, unspecified: Secondary | ICD-10-CM

## 2015-09-24 DIAGNOSIS — R293 Abnormal posture: Secondary | ICD-10-CM
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
## 2015-09-24 NOTE — Patient Instructions (Addendum)
     Do hard swallow when you are eating/drinking to help clear any residue in your throat   Get the person's attention before you speak  Look the other person in the face  Be in close proximity to the person you are talking to  Be aware of the noise you are trying to talk over - mute the TV, move away from noisy appliances, fans, motors, etc

## 2015-09-24 NOTE — Therapy (Signed)
[facility] [address] [place], [place], [address] Phone: [phone]   Fax:  [phone]  Speech Language Pathology Treatment  Patient Details  Name: Joseph Arroyo MRN: [id number] Date of Birth: 06-02-1940 Referring Provider: Dr. Star Age  Encounter Date: 09/24/2015      End of Session - 09/24/15 1530    SLP Start Time 1233   SLP Stop Time  1316   SLP Time Calculation (min) 43 min   Activity Tolerance Patient tolerated treatment well      Past Medical History:  Diagnosis Date  . Abnormal involuntary movements(781.0)   . Acute sinusitis, unspecified   . Acute upper respiratory infections of unspecified site   . Allergic rhinitis due to pollen 11/21/2007  . Anxiety state, unspecified 11/28/2008  . Brachial neuritis or radiculitis NOS   . Cervicalgia   . Chest pain, unspecified   . Concussion    age 11 - s/p accident  . Conjunctivitis unspecified   . Contact dermatitis and other eczema due to plants (except food)   . Contusion of toe 09/12/2008  . Costal chondritis   . Cramp of limb   . Depression   . Dysfunction of eustachian tube   . Enthesopathy of unspecified site   . Essential and other specified forms of tremor   . GERD (gastroesophageal reflux disease)    in past  . Headache    migraines - aura - no pain  . Hip, thigh, leg, and ankle, insect bite, nonvenomous, without mention of infection(916.4)   . Lumbago   . Occlusion and stenosis of carotid artery without mention of cerebral infarction   . Other malaise and fatigue 11/21/2007  . Pain in limb   . Seizures ([place])    age 44 - after concussion  . Sleep disturbance, unspecified   . Sprain of hand, unspecified site   . Sprain of neck   . Thrombocytopenia, unspecified ([place])   . Tremors of nervous system    Parkinsonian symptoms  . Ulcer     Past Surgical History:  Procedure Laterality Date  . CATARACT EXTRACTION Right 07/18/14   . CATARACT EXTRACTION W/PHACO Left 08/01/2014   Procedure: CATARACT EXTRACTION PHACO AND INTRAOCULAR LENS PLACEMENT (IOC);  Surgeon: Leandrew Koyanagi, MD;  Location: [facility];  Service: Ophthalmology;  Laterality: Left;  IVA TOPICAL  . COLONOSCOPY  2008  . OTHER SURGICAL HISTORY  2002   ear surgery  . STAPEDES SURGERY Right 2002    There were no vitals filed for this visit.      Subjective Assessment - 09/24/15 1234    Subjective "I was buying a car this weekend"   Patient is accompained by: Family member   Special Tests spouse               ADULT SLP TREATMENT - 09/24/15 1236      General Information   Behavior/Cognition Alert;Cooperative;Pleasant mood     Treatment Provided   Treatment provided Cognitive-Linquistic;Dysphagia     Dysphagia Treatment   Other treatment/comments HEP for dysphagia completed with occasional min verbal cues - Mendelson with mod A and extended time.       Cognitive-Linquistic Treatment   Treatment focused on Dysarthria   Skilled Treatment Loud /a/ to recalibrate conversation volume with an average of 90dB.   Moderately complex conversation over 12 minutes with average 70dB with supervision A.     Assessment / Recommendations / Plan   Plan Continue with current  plan of care          SLP Education - 09/24/15 1530    Education provided Yes          SLP Short Term Goals - 09/24/15 1321      SLP SHORT TERM GOAL #1   Title Pt will average 70dB at sentence level during structured speech tasks with rare min A   Status Achieved     SLP SHORT TERM GOAL #2   Title pt will demo simple conversation of 10 minutes at 70dB over 2 sessions   Status Achieved     SLP SHORT TERM GOAL #3   Title Pt will participate in objective swallowing assessment with goals to follow pending MBSS   Status Achieved     SLP SHORT TERM GOAL #4   Title pt will complete HEP for dysphagia with rare min A   Time 1   Period Weeks   Status  On-going          SLP Long Term Goals - 09/24/15 1322      SLP LONG TERM GOAL #1   Title pt will demo loudness of 70dB in 15 minutes mod complex conversation during therapy, outside ST room, in 2 sessions   Time 4   Period Weeks   Status Revised     SLP LONG TERM GOAL #2   Title report decr of symptoms of dysphagia when engaging in compensatory strategies at home   Time 4   Period Weeks   Status On-going     SLP LONG TERM GOAL #3   Title pt will demo HEP for dysphagia with modified independence over two sessions   Time 4   Period Weeks   Status New          Plan - 09/24/15 1320    Clinical Impression Statement Mr. Gwinn demonstrated intelligibility of 100% in mod complex conversation of 13 minutes today outdoors. His performance with Caryl Ada was improved today. Pt would beneift from cont ST to address consistency of loudness in conversation as well as swallowing abilities.    Speech Therapy Frequency 2x / week   Duration --  3 weeks   Treatment/Interventions Pharyngeal strengthening exercises;Aspiration precaution training;Diet toleration management by SLP;Compensatory techniques;Trials of upgraded texture/liquids;SLP instruction and feedback;Compensatory strategies;Internal/external aids;Environmental controls;Oral motor exercises;Patient/family education;Cueing hierarchy;Functional tasks   Potential to Achieve Goals Good      Patient will benefit from skilled therapeutic intervention in order to improve the following deficits and impairments:   Dysphagia  Dysarthria and anarthria    Problem List Patient Active Problem List   Diagnosis Date Noted  . Annual physical exam 07/08/2015  . BPH without obstruction/lower urinary tract symptoms 07/08/2015  . Chronic fatigue 10/23/2014  . Chronic anxiety 10/23/2014  . Parkinson's disease ([place]) 09/20/2014  . Malnutrition ([place]) 09/20/2014  . Depression 07/26/2013  . Unsteady gait 07/26/2013  . Orthostatic  hypotension 07/26/2013  . Anxiety 06/23/2013  . Chest pain 06/23/2013  . Tremor 05/18/2013  . Occlusion and stenosis of carotid artery without mention of cerebral infarction 08/30/2012    Lovvorn, Annye Rusk MS, CCC-SLP 09/24/2015, 3:35 PM  [facility] [address] [place], [place], [address] Phone: [phone]   Fax:  [phone]   Name: Joseph Arroyo MRN: [id number] Date of Birth: 07-Jan-1941

## 2015-09-26 ENCOUNTER — Ambulatory Visit: Payer: Medicare Other

## 2015-09-26 ENCOUNTER — Ambulatory Visit: Payer: Medicare Other | Admitting: Occupational Therapy

## 2015-09-27 ENCOUNTER — Encounter: Payer: Self-pay | Admitting: General Surgery

## 2015-10-01 ENCOUNTER — Ambulatory Visit: Payer: Medicare Other

## 2015-10-01 ENCOUNTER — Ambulatory Visit: Payer: Medicare Other | Attending: Neurology | Admitting: Occupational Therapy

## 2015-10-01 DIAGNOSIS — R29898 Other symptoms and signs involving the musculoskeletal system: Secondary | ICD-10-CM | POA: Insufficient documentation

## 2015-10-01 DIAGNOSIS — R2689 Other abnormalities of gait and mobility: Secondary | ICD-10-CM | POA: Diagnosis present

## 2015-10-01 DIAGNOSIS — R1310 Dysphagia, unspecified: Secondary | ICD-10-CM

## 2015-10-01 DIAGNOSIS — R471 Dysarthria and anarthria: Secondary | ICD-10-CM

## 2015-10-01 DIAGNOSIS — R29818 Other symptoms and signs involving the nervous system: Secondary | ICD-10-CM | POA: Diagnosis present

## 2015-10-01 DIAGNOSIS — R2681 Unsteadiness on feet: Secondary | ICD-10-CM | POA: Diagnosis present

## 2015-10-01 DIAGNOSIS — R293 Abnormal posture: Secondary | ICD-10-CM | POA: Insufficient documentation

## 2015-10-01 DIAGNOSIS — R278 Other lack of coordination: Secondary | ICD-10-CM | POA: Diagnosis present

## 2015-10-01 NOTE — Therapy (Signed)
[facility] [address] [place], [place], [address] Phone: [phone]   Fax:  [phone]  Speech Language Pathology Treatment  Patient Details  Name: Joseph Arroyo MRN: [id number] Date of Birth: [birth year] Referring Provider: Dr. Star Age  Encounter Date: 10/01/2015      End of Session - 10/01/15 1310    Visit Number 9   Number of Visits 17   Date for SLP Re-Evaluation 10/17/15   SLP Start Time 1150   SLP Stop Time  1230   SLP Time Calculation (min) 40 min   Activity Tolerance Patient tolerated treatment well      Past Medical History:  Diagnosis Date  . Abnormal involuntary movements(781.0)   . Acute sinusitis, unspecified   . Acute upper respiratory infections of unspecified site   . Allergic rhinitis due to pollen 11/21/2007  . Anxiety state, unspecified 11/28/2008  . Brachial neuritis or radiculitis NOS   . Cervicalgia   . Chest pain, unspecified   . Concussion    age 8 - s/p accident  . Conjunctivitis unspecified   . Contact dermatitis and other eczema due to plants (except food)   . Contusion of toe 09/12/2008  . Costal chondritis   . Cramp of limb   . Depression   . Dysfunction of eustachian tube   . Enthesopathy of unspecified site   . Essential and other specified forms of tremor   . GERD (gastroesophageal reflux disease)    in past  . Headache    migraines - aura - no pain  . Hip, thigh, leg, and ankle, insect bite, nonvenomous, without mention of infection(916.4)   . Lumbago   . Occlusion and stenosis of carotid artery without mention of cerebral infarction   . Other malaise and fatigue 11/21/2007  . Pain in limb   . Seizures ([place])    age 7 - after concussion  . Sleep disturbance, unspecified   . Sprain of hand, unspecified site   . Sprain of neck   . Thrombocytopenia, unspecified ([place])   . Tremors of nervous system    Parkinsonian symptoms  . Ulcer     Past  Surgical History:  Procedure Laterality Date  . CATARACT EXTRACTION Right 07/18/14  . CATARACT EXTRACTION W/PHACO Left 08/01/2014   Procedure: CATARACT EXTRACTION PHACO AND INTRAOCULAR LENS PLACEMENT (IOC);  Surgeon: Leandrew Koyanagi, MD;  Location: [facility];  Service: Ophthalmology;  Laterality: Left;  IVA TOPICAL  . COLONOSCOPY  2008  . OTHER SURGICAL HISTORY  2002   ear surgery  . STAPEDES SURGERY Right 2002    There were no vitals filed for this visit.             ADULT SLP TREATMENT - 10/01/15 1313      General Information   Behavior/Cognition Alert;Cooperative;Pleasant mood     Treatment Provided   Treatment provided Cognitive-Linquistic;Dysphagia     Dysphagia Treatment   Temperature Spikes Noted No   Patient observed directly with PO's Yes   Type of PO's observed Thin liquids   Liquids provided via Cup   Oral Phase Signs & Symptoms --  no overt s/s noted   Pharyngeal Phase Signs & Symptoms Immediate cough  once   Other treatment/comments Pt with cough once on 7 water boluses in session. SLP suggested to pt to hold bolus in mouth prior to swallow for motor organization puroses and then swallow. When pt did this no coughing noted during session. SLP facilitated practice  wiht pt's HEP and pt req'd min A rarely for Mendelsohn.      Cognitive-Linquistic Treatment   Treatment focused on Dysarthria   Skilled Treatment Pt maintained loudness of 70 dB or above for 22 minutes conversation within therapy room.      Assessment / Recommendations / Plan   Plan Continue with current plan of care     Dysphagia Recommendations   Diet recommendations Regular;Thin liquid   Compensations Effortful swallow  hold liquid boluses for ~1/2 second prior to swallow     Progression Toward Goals   Progression toward goals Progressing toward goals            SLP Short Term Goals - 10/01/15 1218      SLP SHORT TERM GOAL #1   Title Pt will average 70dB at sentence  level during structured speech tasks with rare min A   Status Achieved     SLP SHORT TERM GOAL #2   Title pt will demo simple conversation of 10 minutes at 70dB over 2 sessions   Status Achieved     SLP SHORT TERM GOAL #3   Title Pt will participate in objective swallowing assessment with goals to follow pending MBSS   Status Achieved     SLP SHORT TERM GOAL #4   Title pt will complete HEP for dysphagia with rare min A   Time --   Period --   Status Not Met          SLP Long Term Goals - 10/01/15 1220      SLP LONG TERM GOAL #1   Title pt will demo loudness of 70dB in 15 minutes mod complex conversation during therapy, outside [facility] room, in 2 sessions   Baseline 10-01-15   Time 2   Period Weeks   Status Revised     SLP LONG TERM GOAL #2   Title report decr of symptoms of dysphagia when engaging in compensatory strategies at home   Time 2   Period Weeks   Status On-going     SLP LONG TERM GOAL #3   Title pt will demo HEP for dysphagia with modified independence over two sessions   Time 2   Period Weeks   Status On-going          Plan - 10/01/15 1317    Clinical Impression Statement Mr. Schlauch demonstrated intelligibility of 100% in extended mod complex conversation of  minutes today in therapy room. Pt will need to move to out of thearpy room next session to meet LTG. Mendelsohn req'd min A rarely but was yet improved today. SLP suggested a 1/2-1 second liquid hold for motor organization prior to swallowing. Pt would beneift from cont ST to address consistency of loudness in conversation as well as swallowing abilities.    Speech Therapy Frequency 2x / week   Duration 2 weeks   Treatment/Interventions Pharyngeal strengthening exercises;Aspiration precaution training;Diet toleration management by SLP;Compensatory techniques;Trials of upgraded texture/liquids;SLP instruction and feedback;Compensatory strategies;Internal/external aids;Environmental controls;Oral motor  exercises;Patient/family education;Cueing hierarchy;Functional tasks   Potential to Achieve Goals Good      Patient will benefit from skilled therapeutic intervention in order to improve the following deficits and impairments:   Dysarthria and anarthria  Dysphagia    Problem List Patient Active Problem List   Diagnosis Date Noted  . Annual physical exam 07/08/2015  . BPH without obstruction/lower urinary tract symptoms 07/08/2015  . Chronic fatigue 10/23/2014  . Chronic anxiety 10/23/2014  . Parkinson's disease ([place])  09/20/2014  . Malnutrition ([place]) 09/20/2014  . Depression 07/26/2013  . Unsteady gait 07/26/2013  . Orthostatic hypotension 07/26/2013  . Anxiety 06/23/2013  . Chest pain 06/23/2013  . Tremor 05/18/2013  . Occlusion and stenosis of carotid artery without mention of cerebral infarction 08/30/2012    [facility] ,MS, [place]  10/01/2015, 1:20 PM  [facility] [address] [place], [place], [address] Phone: [phone]   Fax:  [phone]   Name: Joseph Arroyo MRN: [id number] Date of Birth: November 22, 1940

## 2015-10-02 NOTE — Therapy (Signed)
[facility] [address] [place], [place], [address] Phone: [phone]   Fax:  [phone]  Occupational Therapy Treatment  Patient Details  Name: MAXIMOUS SUIRE MRN: [id number] Date of Birth: May 27, 1940 Referring Provider: Dr. Star Age  Encounter Date: 10/01/2015      OT End of Session - 10/01/15 1309    Visit Number 9   Number of Visits [place], no auth req, no visit limit, G-code   Authorization Time Period G-code next visit   Authorization - Visit Number 9   Authorization - Number of Visits 10   Activity Tolerance Patient tolerated treatment well   Behavior During Therapy [facility] for tasks assessed/performed      Past Medical History:  Diagnosis Date  . Abnormal involuntary movements(781.0)   . Acute sinusitis, unspecified   . Acute upper respiratory infections of unspecified site   . Allergic rhinitis due to pollen 11/21/2007  . Anxiety state, unspecified 11/28/2008  . Brachial neuritis or radiculitis NOS   . Cervicalgia   . Chest pain, unspecified   . Concussion    age 86 - s/p accident  . Conjunctivitis unspecified   . Contact dermatitis and other eczema due to plants (except food)   . Contusion of toe 09/12/2008  . Costal chondritis   . Cramp of limb   . Depression   . Dysfunction of eustachian tube   . Enthesopathy of unspecified site   . Essential and other specified forms of tremor   . GERD (gastroesophageal reflux disease)    in past  . Headache    migraines - aura - no pain  . Hip, thigh, leg, and ankle, insect bite, nonvenomous, without mention of infection(916.4)   . Lumbago   . Occlusion and stenosis of carotid artery without mention of cerebral infarction   . Other malaise and fatigue 11/21/2007  . Pain in limb   . Seizures ([place])    age 3 - after concussion  . Sleep disturbance, unspecified   . Sprain of hand, unspecified site   . Sprain of neck    . Thrombocytopenia, unspecified ([place])   . Tremors of nervous system    Parkinsonian symptoms  . Ulcer     Past Surgical History:  Procedure Laterality Date  . CATARACT EXTRACTION Right 07/18/14  . CATARACT EXTRACTION W/PHACO Left 08/01/2014   Procedure: CATARACT EXTRACTION PHACO AND INTRAOCULAR LENS PLACEMENT (IOC);  Surgeon: Leandrew Koyanagi, MD;  Location: [facility];  Service: Ophthalmology;  Laterality: Left;  IVA TOPICAL  . COLONOSCOPY  2008  . OTHER SURGICAL HISTORY  2002   ear surgery  . STAPEDES SURGERY Right 2002    There were no vitals filed for this visit.        Reviewed PWR! Exercises issued in supine and PWR! Twist in sidelying, 10 reps each min v.c. Then pt returned demonstration. Standing for dynamic crossbody and lateral reaching with trunk rotation, alternating UE's for cognitive component while copying peg design, min difficulty/ v.c. Pt brought in completed flowsheet, therapist reinforced importance of continuing a variety of exercises from the flow sheet especially the PWR! Exercises to prevent future complications.                      OT Short Term Goals - 09/24/15 1214      OT SHORT TERM GOAL #1   Title Pt will be independent with updated PD-specific HEP for coordination, bradykinesia,  rigidity.--check STGs 09/24/15   Time 4   Status On-going     OT SHORT TERM GOAL #2   Title Pt will improve ability to eat as shown by improving time on PPT#2 by at least 3sec with L hand.   Time 4   Period Weeks   Status Achieved  10.53 secs     OT SHORT TERM GOAL #3   Title Pt will improve coordination/functional reaching for ADLs as shown by improving score on box and blocks test by at least 3 with LUE.   Baseline L-49 blocks (R-59blocks)   Time 4   Period Weeks   Status Achieved  09/24/15:  52 blocks     OT SHORT TERM GOAL #4   Title Pt will verbalize understanding of memory/cognitive compensation strategies and ways to promote  cognitive skills prn.   Baseline ----   Time 4   Period Weeks   Status Achieved  memory compensations/ ways to keep thinking skills sharp issued, family educated,            OT Long Term Goals - 10/02/15 1344      OT LONG TERM GOAL #1   Title Pt will verbalize understanding of strategies/AE to increase ease with ADLs/IADLs prn.--check LTGs 10/25/15   Baseline ------------   Time 8   Period Weeks   Status On-going     OT LONG TERM GOAL #2   Title Pt will improve ability to eat as shown by improving time on PPT#2 by at least 6sec with L hand.   Baseline 21.81sec   Time 8   Period Weeks   Status On-going     OT LONG TERM GOAL #3   Title Pt will improve functional reaching/coordination for ADLs as shown by improving score by at least 6 on box and blocks test with LUE.   Baseline L-49 blocks (R-59 blocks)   Time 8   Period Weeks   Status On-going     OT LONG TERM GOAL #4   Title Pt will improve ease with dressing as shown by buttoning/unbuttoning 3 buttons in less than 20sec.   Baseline 22.54sec   Time 8   Period Weeks   Status On-going  22 secs with flicks, 28 secs without     OT LONG TERM GOAL #5   Title -----------------------               Plan - 10/01/15 1308    Clinical Impression Statement Pt completed and brought in exercise flowsheet. Pt reports attempting to perform exercises according to flowsheet at home.   Rehab Potential Good   OT Frequency 2x / week   OT Duration 8 weeks   OT Treatment/Interventions Self-care/ADL training;Therapeutic exercise;Patient/family education;Balance training;Manual Therapy;Neuromuscular education;Energy conservation;Therapeutic exercises;Therapeutic activities;DME and/or AE instruction;Parrafin;Electrical Stimulation;Fluidtherapy;Cognitive remediation/compensation;Visual/perceptual remediation/compensation;Passive range of motion;Contrast Bath;Moist Heat   Plan reinforce large amplitude movements for functional activity    OT Home Exercise Plan issued coordination HEP, PWR! hands, supine PWR! up, rock, and twist, and prone twist   Consulted and Agree with Plan of Care Patient;Family member/caregiver      Patient will benefit from skilled therapeutic intervention in order to improve the following deficits and impairments:  Decreased cognition, Decreased mobility, Decreased range of motion, Decreased coordination, Impaired UE functional use, Impaired perceived functional ability, Decreased knowledge of use of DME, Decreased balance, Impaired tone  Visit Diagnosis: Other symptoms and signs involving the musculoskeletal system  Other symptoms and signs involving the nervous system  Abnormal posture  Other lack of coordination  Other abnormalities of gait and mobility    Problem List Patient Active Problem List   Diagnosis Date Noted  . Annual physical exam 07/08/2015  . BPH without obstruction/lower urinary tract symptoms 07/08/2015  . Chronic fatigue 10/23/2014  . Chronic anxiety 10/23/2014  . Parkinson's disease ([place]) 09/20/2014  . Malnutrition ([place]) 09/20/2014  . Depression 07/26/2013  . Unsteady gait 07/26/2013  . Orthostatic hypotension 07/26/2013  . Anxiety 06/23/2013  . Chest pain 06/23/2013  . Tremor 05/18/2013  . Occlusion and stenosis of carotid artery without mention of cerebral infarction 08/30/2012    Sabastion Hrdlicka 10/02/2015, 1:45 PM  [facility] [address] [place], [place], [address] Phone: [phone]   Fax:  [phone]  Name: KAIAN GUZMAN MRN: [id number] Date of Birth: 1940-05-23

## 2015-10-03 ENCOUNTER — Ambulatory Visit: Payer: Medicare Other | Admitting: Occupational Therapy

## 2015-10-03 ENCOUNTER — Ambulatory Visit: Payer: Medicare Other

## 2015-10-03 DIAGNOSIS — R29898 Other symptoms and signs involving the musculoskeletal system: Secondary | ICD-10-CM | POA: Diagnosis not present

## 2015-10-03 DIAGNOSIS — R278 Other lack of coordination: Secondary | ICD-10-CM

## 2015-10-03 DIAGNOSIS — R293 Abnormal posture: Secondary | ICD-10-CM

## 2015-10-03 DIAGNOSIS — R1310 Dysphagia, unspecified: Secondary | ICD-10-CM

## 2015-10-03 DIAGNOSIS — R29818 Other symptoms and signs involving the nervous system: Secondary | ICD-10-CM

## 2015-10-03 DIAGNOSIS — R471 Dysarthria and anarthria: Secondary | ICD-10-CM

## 2015-10-03 NOTE — Therapy (Addendum)
[facility] [address] [place], [place], [address] Phone: [phone]   Fax:  [phone]  Speech Language Pathology Treatment  Patient Details  Name: Joseph Arroyo MRN: [id number] Date of Birth: May 06, 1940 Referring Provider: Dr. Star Age  Encounter Date: 10/03/2015      End of Session - 10/03/15 1224    Visit Number 10   Number of Visits 17   Date for SLP Re-Evaluation 10/17/15   SLP Start Time 4268   SLP Stop Time  1145   SLP Time Calculation (min) 43 min   Activity Tolerance Patient tolerated treatment well      Past Medical History:  Diagnosis Date  . Abnormal involuntary movements(781.0)   . Acute sinusitis, unspecified   . Acute upper respiratory infections of unspecified site   . Allergic rhinitis due to pollen 11/21/2007  . Anxiety state, unspecified 11/28/2008  . Brachial neuritis or radiculitis NOS   . Cervicalgia   . Chest pain, unspecified   . Concussion    age 76 - s/p accident  . Conjunctivitis unspecified   . Contact dermatitis and other eczema due to plants (except food)   . Contusion of toe 09/12/2008  . Costal chondritis   . Cramp of limb   . Depression   . Dysfunction of eustachian tube   . Enthesopathy of unspecified site   . Essential and other specified forms of tremor   . GERD (gastroesophageal reflux disease)    in past  . Headache    migraines - aura - no pain  . Hip, thigh, leg, and ankle, insect bite, nonvenomous, without mention of infection(916.4)   . Lumbago   . Occlusion and stenosis of carotid artery without mention of cerebral infarction   . Other malaise and fatigue 11/21/2007  . Pain in limb   . Seizures ([place])    age 46 - after concussion  . Sleep disturbance, unspecified   . Sprain of hand, unspecified site   . Sprain of neck   . Thrombocytopenia, unspecified ([place])   . Tremors of nervous system    Parkinsonian symptoms  . Ulcer     Past  Surgical History:  Procedure Laterality Date  . CATARACT EXTRACTION Right 07/18/14  . CATARACT EXTRACTION W/PHACO Left 08/01/2014   Procedure: CATARACT EXTRACTION PHACO AND INTRAOCULAR LENS PLACEMENT (IOC);  Surgeon: Leandrew Koyanagi, MD;  Location: [facility];  Service: Ophthalmology;  Laterality: Left;  IVA TOPICAL  . COLONOSCOPY  2008  . OTHER SURGICAL HISTORY  2002   ear surgery  . STAPEDES SURGERY Right 2002    There were no vitals filed for this visit.      Subjective Assessment - 10/03/15 1107    Subjective Pt coughed with H2O during OT - forgot to hold in mouth 1/2 second prior to triggering swallow.   Patient is accompained by: Family member  wife   Currently in Pain? No/denies               ADULT SLP TREATMENT - 10/03/15 1109      General Information   Behavior/Cognition Alert;Cooperative;Pleasant mood     Treatment Provided   Treatment provided Dysphagia;Cognitive-Linquistic     Dysphagia Treatment   Temperature Spikes Noted No   Patient observed directly with PO's Yes   Type of PO's observed Thin liquids   Other treatment/comments Dysphagia HEP completed with rare min A (Mendelsohn). SLP suggested pt eat more pecans and cashews due to no paper "shells".  If walnuts, almonds, pistachios ingested, SLP agreed with pt to take fewer nuts at once.       Cognitive-Linquistic Treatment   Treatment focused on Dysarthria   Skilled Treatment Pt maintained loudness appropriate for traffic noise, aircraft noise, and leaf blower outside for 23 minutes.      Assessment / Recommendations / Plan   Plan Continue with current plan of care  one more visit to check dysphagia HEP and precautions     Dysphagia Recommendations   Diet recommendations Regular;Thin liquid   Compensations --  hold liquids for 1/2-1 second prior to swallow     Progression Toward Goals   Progression toward goals Progressing toward goals            SLP Short Term Goals -  10/01/15 1218      SLP SHORT TERM GOAL #1   Title Pt will average 70dB at sentence level during structured speech tasks with rare min A   Status Achieved     SLP SHORT TERM GOAL #2   Title pt will demo simple conversation of 10 minutes at 70dB over 2 sessions   Status Achieved     SLP SHORT TERM GOAL #3   Title Pt will participate in objective swallowing assessment with goals to follow pending MBSS   Status Achieved     SLP SHORT TERM GOAL #4   Title pt will complete HEP for dysphagia with rare min A   Time --   Period --   Status Not Met          SLP Long Term Goals - 20-Oct-2015 1228      SLP LONG TERM GOAL #1   Title pt will demo loudness of 70dB in 15 minutes mod complex conversation during therapy, outside [place] room, in 2 sessions   Status Achieved     SLP LONG TERM GOAL #2   Title report decr of symptoms of dysphagia when engaging in compensatory strategies at home   Time 2   Period Weeks   Status On-going     SLP LONG TERM GOAL #3   Title pt will demo HEP for dysphagia with modified independence over two sessions   Time 2   Period Weeks   Status On-going          Plan - October 20, 2015 1225    Clinical Impression Statement Mr. Bracamonte demonstrated intelligibility of 100% in 23 minutes mod-max complex conversation, outside [place] room - all speech goals are met, and swallowing goals remain. Mendelsohn req'd min-mod A rarely. Pt would beneift from one more ST visit to address consistency of HEP completion and use of swallow precautions.   Speech Therapy Frequency 2x / week   Duration 2 weeks   Treatment/Interventions Pharyngeal strengthening exercises;Aspiration precaution training;Diet toleration management by SLP;Compensatory techniques;Trials of upgraded texture/liquids;SLP instruction and feedback;Compensatory strategies;Internal/external aids;Environmental controls;Oral motor exercises;Patient/family education;Cueing hierarchy;Functional tasks   Potential to Achieve  Goals Good      Patient will benefit from skilled therapeutic intervention in order to improve the following deficits and impairments:   Dysarthria and anarthria  Dysphagia      G-Codes - 10-20-15 1229    Functional Assessment Tool Used NOMS   Functional Limitations Motor speech   Motor Speech Current Status [phone]) At least 80 percent but less than 100 percent impaired, limited or restricted   Motor Speech Goal Status ([id number]) At least 1 percent but less than 20 percent impaired, limited or restricted     Speech  Therapy Progress Note  Dates of Reporting Period: 08/22/15 to present  Subjective: Pt has been seen for 10 ST sessions focusing mainly on loudness in conversation. A modified barium swallow test was also completed during this therapy course. Results are under "Notes", but regular diet and thin liquids were recommended with minor modifications. SLP has found that pt frequency of difficulty with liquids decreases to near zero if pt holds liquids for 1/2-1 second prior to swallowing. He continues to work on [employer] for dysphagia. Conversational loudness LTGs were met today  Objective Measurements: Loudness has incr'd to 70dB average in long conversation, pt still is not independent with HEP for dysphagia.    Goal Update: See goal update above.  Plan: See pt for 1-2 more sessions to ensure success with procedure with HEP  Reason Skilled Services are Required: Ensure success with procedure with HEP.  Problem List Patient Active Problem List   Diagnosis Date Noted  . Annual physical exam 07/08/2015  . BPH without obstruction/lower urinary tract symptoms 07/08/2015  . Chronic fatigue 10/23/2014  . Chronic anxiety 10/23/2014  . Parkinson's disease ([place]) 09/20/2014  . Malnutrition ([place]) 09/20/2014  . Depression 07/26/2013  . Unsteady gait 07/26/2013  . Orthostatic hypotension 07/26/2013  . Anxiety 06/23/2013  . Chest pain 06/23/2013  . Tremor 05/18/2013  . Occlusion and  stenosis of carotid artery without mention of cerebral infarction 08/30/2012    [facility] ,[place], [place]  10/03/2015, 12:29 PM  [facility] [address] [place], [place], [address] Phone: [phone]   Fax:  [phone]   Name: AXIEL FJELD MRN: [id number] Date of Birth: [birth year]

## 2015-10-03 NOTE — Therapy (Signed)
[facility] [address] [place], [place], [address] Phone: [phone]   Fax:  [phone]  Occupational Therapy Treatment  Patient Details  Name: Joseph Arroyo MRN: [id number] Date of Birth: [birth year] Referring Provider: Dr. Star Age  Encounter Date: 10/03/2015      OT End of Session - 10/03/15 1614    Visit Number 10   Number of Visits 17   Date for OT Re-Evaluation 10/27/15   Authorization Type [employer] State, no auth req, no visit limit, G-code   Authorization Time Period G-code next visit   Authorization - Visit Number 10   Authorization - Number of Visits 10   OT Start Time 1017   OT Stop Time 1100   OT Time Calculation (min) 43 min   Activity Tolerance Patient tolerated treatment well   Behavior During Therapy [facility] for tasks assessed/performed      Past Medical History:  Diagnosis Date  . Abnormal involuntary movements(781.0)   . Acute sinusitis, unspecified   . Acute upper respiratory infections of unspecified site   . Allergic rhinitis due to pollen 11/21/2007  . Anxiety state, unspecified 11/28/2008  . Brachial neuritis or radiculitis NOS   . Cervicalgia   . Chest pain, unspecified   . Concussion    age 4 - s/p accident  . Conjunctivitis unspecified   . Contact dermatitis and other eczema due to plants (except food)   . Contusion of toe 09/12/2008  . Costal chondritis   . Cramp of limb   . Depression   . Dysfunction of eustachian tube   . Enthesopathy of unspecified site   . Essential and other specified forms of tremor   . GERD (gastroesophageal reflux disease)    in past  . Headache    migraines - aura - no pain  . Hip, thigh, leg, and ankle, insect bite, nonvenomous, without mention of infection(916.4)   . Lumbago   . Occlusion and stenosis of carotid artery without mention of cerebral infarction   . Other malaise and fatigue 11/21/2007  . Pain in limb   . Seizures ([place])     age 74 - after concussion  . Sleep disturbance, unspecified   . Sprain of hand, unspecified site   . Sprain of neck   . Thrombocytopenia, unspecified ([place])   . Tremors of nervous system    Parkinsonian symptoms  . Ulcer     Past Surgical History:  Procedure Laterality Date  . CATARACT EXTRACTION Right 07/18/14  . CATARACT EXTRACTION W/PHACO Left 08/01/2014   Procedure: CATARACT EXTRACTION PHACO AND INTRAOCULAR LENS PLACEMENT (IOC);  Surgeon: Leandrew Koyanagi, MD;  Location: [facility];  Service: Ophthalmology;  Laterality: Left;  IVA TOPICAL  . COLONOSCOPY  2008  . OTHER SURGICAL HISTORY  2002   ear surgery  . STAPEDES SURGERY Right 2002    There were no vitals filed for this visit.      Subjective Assessment - 10/03/15 1635    Subjective  mild back pain   Patient is accompained by: Family member   Pertinent History see Epic   Patient Stated Goals ride his motorcycle, be active   Currently in Pain? Yes   Pain Score 2    Pain Location Back   Pain Orientation Lower   Pain Descriptors / Indicators Discomfort   Pain Type Chronic pain   Pain Onset More than a month ago   Pain Frequency Intermittent   Aggravating Factors  driving   Pain  Relieving Factors reposistioning   Multiple Pain Sites No         Reviewed strategies for scooting a chair under table, and standing up from a chair using PWR!up, Pt returned demonstration. Multi directional large amplitude movements, with cognitive component to follow a routine (with boomwhakers) min v.c/ demonstration                PWR [facility]) - 10/03/15 1622    PWR! Up x20   PWR! Rock [employer]! Twist x20   Comments reviewed per pt request, only occasional min v.c.   PWR! Twist x 20               OT Short Term Goals - 10/03/15 1021      OT SHORT TERM GOAL #1   Title Pt will be independent with updated PD-specific HEP for coordination, bradykinesia, rigidity.--check STGs 09/24/15   Time 4   Period  Weeks   Status Achieved     OT SHORT TERM GOAL #2   Title Pt will improve ability to eat as shown by improving time on PPT#2 by at least 3sec with L hand.   Time 4   Period Weeks   Status Achieved     OT SHORT TERM GOAL #3   Title Pt will improve coordination/functional reaching for ADLs as shown by improving score on box and blocks test by at least 3 with LUE.   Time 4   Status Achieved     OT SHORT TERM GOAL #4   Title Pt will verbalize understanding of memory/cognitive compensation strategies and ways to promote cognitive skills prn.   Time 4   Period Weeks   Status Achieved           OT Long Term Goals - 10/03/15 1022      OT LONG TERM GOAL #1   Title Pt will verbalize understanding of strategies/AE to increase ease with ADLs/IADLs prn.--check LTGs 10/25/15   Time 8   Period Weeks   Status Achieved     OT LONG TERM GOAL #2   Title Pt will improve ability to eat as shown by improving time on PPT#2 by at least 6sec with L hand.   Time 8   Period Weeks   Status On-going     OT LONG TERM GOAL #3   Title Pt will improve functional reaching/coordination for ADLs as shown by improving score by at least 6 on box and blocks test with LUE.   Time 8   Period Weeks   Status On-going     OT LONG TERM GOAL #4   Title Pt will improve ease with dressing as shown by buttoning/unbuttoning 3 buttons in less than 20sec.   Time 8   Period Weeks   Status On-going               Plan - 10/03/15 1041    Clinical Impression Statement Pt is progressing towards goals. He has made execellent progress with STG's and continues to progress towards long term goals. Pt can benefit from continued skilled occupational therpay to address: bradykinesia, rigidity, decreased balance, decreased coordiantion, cognitive deficits. Anticipate discharge next week.   Rehab Potential Good   OT Frequency 2x / week   OT Duration 8 weeks   OT Treatment/Interventions Self-care/ADL  training;Therapeutic exercise;Patient/family education;Balance training;Manual Therapy;Neuromuscular education;Energy conservation;Therapeutic exercises;Therapeutic activities;DME and/or AE instruction;Parrafin;Electrical Stimulation;Fluidtherapy;Cognitive remediation/compensation;Visual/perceptual remediation/compensation;Passive range of motion;Contrast Bath;Moist Heat   Plan check goals anticpate d/c next week  OT Home Exercise Plan issued coordination HEP, PWR! hands, supine PWR! up, rock, and twist, and prone twist   Consulted and Agree with Plan of Care Patient;Family member/caregiver      Patient will benefit from skilled therapeutic intervention in order to improve the following deficits and impairments:  Decreased cognition, Decreased mobility, Decreased range of motion, Decreased coordination, Impaired UE functional use, Impaired perceived functional ability, Decreased knowledge of use of DME, Decreased balance, Impaired tone  Visit Diagnosis: Other symptoms and signs involving the nervous system  Other symptoms and signs involving the musculoskeletal system  Abnormal posture  Other lack of coordination      G-Codes - Oct 30, 2015 1619    Functional Assessment Tool Used PPT#2:  10.53( on 09/24/15), 3 button/ unbutton: 22 secs( with flicks), 28 secs   Functional Limitation Carrying, moving and handling objects   Carrying, Moving and Handling Objects Current Status [phone]) At least 20 percent but less than 40 percent impaired, limited or restricted   Carrying, Moving and Handling Objects Goal Status [id number]) At least 1 percent but less than 20 percent impaired, limited or restricted    Problem List Patient Active Problem List   Diagnosis Date Noted  . Annual physical exam 07/08/2015  . BPH without obstruction/lower urinary tract symptoms 07/08/2015  . Chronic fatigue 10/23/2014  . Chronic anxiety 10/23/2014  . Parkinson's disease ([place]) 09/20/2014  . Malnutrition ([place]) 09/20/2014   . Depression 07/26/2013  . Unsteady gait 07/26/2013  . Orthostatic hypotension 07/26/2013  . Anxiety 06/23/2013  . Chest pain 06/23/2013  . Tremor 05/18/2013  . Occlusion and stenosis of carotid artery without mention of cerebral infarction 08/30/2012   Occupational Therapy Progress Note  Dates of Reporting Period: 08/26/15 to 10/30/2015  Objective Reports of Subjective Statement: see above  Objective Measurements: see g-code  Goal Update: Pt has made excellent progress, achieving all short term goals. Pt continues to progress towards long term goals. Anticipate d/c next week.  Plan: Continue with skilled occupational therapy, anticipate d/c next week.  Reason Skilled Services are Required: to address bradykinesia, cognitive deficits, decreased balance, rigidity, decreased coordination in order to maximize independence with ADLs/IADLs and maitain quality of life. Jadae Steinke October 30, 2015, 4:38 PM Theone Murdoch, OTR/L [phone] Phone: [phone] 4:38 PM 30-Oct-2015 [facility] [address] [place], [place], [address] Phone: [phone]   Fax:  [phone]  Name: Joseph Arroyo MRN: [id number] Date of Birth: 08/08/1940

## 2015-10-07 NOTE — Telephone Encounter (Signed)
Close encounter 

## 2015-10-08 ENCOUNTER — Ambulatory Visit: Payer: Medicare Other | Admitting: Occupational Therapy

## 2015-10-08 ENCOUNTER — Ambulatory Visit: Payer: Medicare Other | Admitting: Speech Pathology

## 2015-10-08 DIAGNOSIS — R29898 Other symptoms and signs involving the musculoskeletal system: Secondary | ICD-10-CM | POA: Diagnosis not present

## 2015-10-08 DIAGNOSIS — R471 Dysarthria and anarthria: Secondary | ICD-10-CM

## 2015-10-08 DIAGNOSIS — R2681 Unsteadiness on feet: Secondary | ICD-10-CM

## 2015-10-08 DIAGNOSIS — R293 Abnormal posture: Secondary | ICD-10-CM

## 2015-10-08 DIAGNOSIS — R278 Other lack of coordination: Secondary | ICD-10-CM

## 2015-10-08 DIAGNOSIS — R29818 Other symptoms and signs involving the nervous system: Secondary | ICD-10-CM

## 2015-10-08 DIAGNOSIS — R2689 Other abnormalities of gait and mobility: Secondary | ICD-10-CM

## 2015-10-08 DIAGNOSIS — R1310 Dysphagia, unspecified: Secondary | ICD-10-CM

## 2015-10-08 NOTE — Therapy (Signed)
[facility] [address] [place], [place], [address] Phone: [phone]   Fax:  [phone]  Speech Language Pathology Treatment  Patient Details  Name: Joseph Arroyo MRN: [id number] Date of Birth: 24-Nov-1940 Referring Provider: Dr. Star Age  Encounter Date: 10/08/2015      End of Session - 10/08/15 1053    Visit Number 11   Number of Visits 17   Date for SLP Re-Evaluation 10/17/15   SLP Start Time 81      Past Medical History:  Diagnosis Date  . Abnormal involuntary movements(781.0)   . Acute sinusitis, unspecified   . Acute upper respiratory infections of unspecified site   . Allergic rhinitis due to pollen 11/21/2007  . Anxiety state, unspecified 11/28/2008  . Brachial neuritis or radiculitis NOS   . Cervicalgia   . Chest pain, unspecified   . Concussion    age 5 - s/p accident  . Conjunctivitis unspecified   . Contact dermatitis and other eczema due to plants (except food)   . Contusion of toe 09/12/2008  . Costal chondritis   . Cramp of limb   . Depression   . Dysfunction of eustachian tube   . Enthesopathy of unspecified site   . Essential and other specified forms of tremor   . GERD (gastroesophageal reflux disease)    in past  . Headache    migraines - aura - no pain  . Hip, thigh, leg, and ankle, insect bite, nonvenomous, without mention of infection(916.4)   . Lumbago   . Occlusion and stenosis of carotid artery without mention of cerebral infarction   . Other malaise and fatigue 11/21/2007  . Pain in limb   . Seizures ([place])    age 70 - after concussion  . Sleep disturbance, unspecified   . Sprain of hand, unspecified site   . Sprain of neck   . Thrombocytopenia, unspecified ([place])   . Tremors of nervous system    Parkinsonian symptoms  . Ulcer     Past Surgical History:  Procedure Laterality Date  . CATARACT EXTRACTION Right 07/18/14  . CATARACT EXTRACTION W/PHACO Left  08/01/2014   Procedure: CATARACT EXTRACTION PHACO AND INTRAOCULAR LENS PLACEMENT (IOC);  Surgeon: Leandrew Koyanagi, MD;  Location: [facility];  Service: Ophthalmology;  Laterality: Left;  IVA TOPICAL  . COLONOSCOPY  2008  . OTHER SURGICAL HISTORY  2002   ear surgery  . STAPEDES SURGERY Right 2002    There were no vitals filed for this visit.      Subjective Assessment - 10/08/15 1028    Currently in Pain? Yes   Pain Score 1    Pain Location Back   Pain Orientation Lower   Pain Descriptors / Indicators Discomfort   Pain Type Chronic pain               ADULT SLP TREATMENT - 10/08/15 1027      General Information   Behavior/Cognition Alert;Cooperative;Pleasant mood     Treatment Provided   Treatment provided Dysphagia;Cognitive-Linquistic     Dysphagia Treatment   Temperature Spikes Noted No   Patient observed directly with PO's Yes   Type of PO's observed Thin liquids   Liquids provided via Cup   Pharyngeal Phase Signs & Symptoms Immediate cough  1/10 swallows likely due to talking while drinking.    Other treatment/comments Dysphagia HEP      Cognitive-Linquistic Treatment   Treatment focused on Dysarthria   Skilled Treatment Pt maintained  70dB throughout session with SBA     Assessment / Recommendations / Plan   Plan All goals met     Dysphagia Recommendations   Diet recommendations Regular;Thin liquid   Compensations Effortful swallow     Progression Toward Goals   Progression toward goals Goals met, education completed, patient discharged from [facility] - 2015-10-13 1200    Education provided Yes   Education Details continue loud /a/ and dysphagia HEP 5/7 days a week upon d/c.   Person(s) Educated Patient;Spouse   Methods Explanation   Comprehension Verbalized understanding     SPEECH THERAPY DISCHARGE SUMMARY  Visits from Start of Care: 11  Current functional level related to goals / functional outcomes: See  goals below   Remaining deficits: Dysarthria; oropharyngeal dysphagia   Education / Equipment: HEP for dysarthria and dysphagia; compensations for dysarthria and dysphagia; s/s of aspiration pna Plan: Patient agrees to discharge.  Patient goals were met. Patient is being discharged due to meeting the stated rehab goals.  ?????          SLP Short Term Goals - 2015/10/13 1053      SLP SHORT TERM GOAL #1   Title Pt will average 70dB at sentence level during structured speech tasks with rare min A   Status Achieved     SLP SHORT TERM GOAL #2   Title pt will demo simple conversation of 10 minutes at 70dB over 2 sessions   Status Achieved     SLP SHORT TERM GOAL #3   Title Pt will participate in objective swallowing assessment with goals to follow pending MBSS   Status Achieved     SLP SHORT TERM GOAL #4   Title pt will complete HEP for dysphagia with rare min A   Status Not Met          SLP Long Term Goals - 10/13/15 1053      SLP LONG TERM GOAL #1   Title pt will demo loudness of 70dB in 15 minutes mod complex conversation during therapy, outside [place] room, in 2 sessions   Status Achieved     SLP LONG TERM GOAL #2   Title report decr of symptoms of dysphagia when engaging in compensatory strategies at home   Time 2   Period Weeks   Status Achieved     SLP LONG TERM GOAL #3   Title pt will demo HEP for dysphagia with modified independence over two sessions   Time 2   Period Weeks   Status Achieved          Plan - Oct 13, 2015 1057    Potential to Achieve Goals Good   Consulted and Agree with Plan of Care Patient;Family member/caregiver   Family Member Consulted spouse      Patient will benefit from skilled therapeutic intervention in order to improve the following deficits and impairments:   Dysarthria and anarthria  Dysphagia      G-Codes - 10/13/2015 04-13-01    Functional Assessment Tool Used NOMS   Functional Limitations Motor speech   Motor Speech Goal  Status [phone]) At least 1 percent but less than 20 percent impaired, limited or restricted   Motor Speech Goal Status (X3235) At least 1 percent but less than 20 percent impaired, limited or restricted      Problem List Patient Active Problem List   Diagnosis Date Noted  . Annual physical exam 07/08/2015  . BPH without  obstruction/lower urinary tract symptoms 07/08/2015  . Chronic fatigue 10/23/2014  . Chronic anxiety 10/23/2014  . Parkinson's disease () 09/20/2014  . Malnutrition ([place]) 09/20/2014  . Depression 07/26/2013  . Unsteady gait 07/26/2013  . Orthostatic hypotension 07/26/2013  . Anxiety 06/23/2013  . Chest pain 06/23/2013  . Tremor 05/18/2013  . Occlusion and stenosis of carotid artery without mention of cerebral infarction 08/30/2012    Shiori Adcox, Annye Rusk MS, CCC-SLP 10/08/2015, 12:04 PM  [facility] [address] [place], [place], [address] Phone: [phone]   Fax:  [phone]   Name: Joseph Arroyo MRN: [id number] Date of Birth: Jun 28, 1940

## 2015-10-08 NOTE — Therapy (Signed)
[facility] [address] [place], [place], [address] Phone: [phone]   Fax:  [phone]  Occupational Therapy Treatment  Patient Details  Name: Joseph Arroyo MRN: [id number] Date of Birth: 05-09-1940 Referring Provider: Dr. Star Age  Encounter Date: 10/08/2015      OT End of Session - 10/08/15 1107    Visit Number 11   Number of Visits 17   Date for OT Re-Evaluation 10/27/15   Authorization Type [employer] State, no auth req, no visit limit, G-code   Authorization Time Period --   Authorization - Visit Number 11   Authorization - Number of Visits 10   OT Start Time 1105   OT Stop Time 1145   OT Time Calculation (min) 40 min   Activity Tolerance Patient tolerated treatment well   Behavior During Therapy [facility] for tasks assessed/performed      Past Medical History:  Diagnosis Date  . Abnormal involuntary movements(781.0)   . Acute sinusitis, unspecified   . Acute upper respiratory infections of unspecified site   . Allergic rhinitis due to pollen 11/21/2007  . Anxiety state, unspecified 11/28/2008  . Brachial neuritis or radiculitis NOS   . Cervicalgia   . Chest pain, unspecified   . Concussion    age 25 - s/p accident  . Conjunctivitis unspecified   . Contact dermatitis and other eczema due to plants (except food)   . Contusion of toe 09/12/2008  . Costal chondritis   . Cramp of limb   . Depression   . Dysfunction of eustachian tube   . Enthesopathy of unspecified site   . Essential and other specified forms of tremor   . GERD (gastroesophageal reflux disease)    in past  . Headache    migraines - aura - no pain  . Hip, thigh, leg, and ankle, insect bite, nonvenomous, without mention of infection(916.4)   . Lumbago   . Occlusion and stenosis of carotid artery without mention of cerebral infarction   . Other malaise and fatigue 11/21/2007  . Pain in limb   . Seizures ([place])    age 50 - after  concussion  . Sleep disturbance, unspecified   . Sprain of hand, unspecified site   . Sprain of neck   . Thrombocytopenia, unspecified ([place])   . Tremors of nervous system    Parkinsonian symptoms  . Ulcer     Past Surgical History:  Procedure Laterality Date  . CATARACT EXTRACTION Right 07/18/14  . CATARACT EXTRACTION W/PHACO Left 08/01/2014   Procedure: CATARACT EXTRACTION PHACO AND INTRAOCULAR LENS PLACEMENT (IOC);  Surgeon: Leandrew Koyanagi, MD;  Location: [facility];  Service: Ophthalmology;  Laterality: Left;  IVA TOPICAL  . COLONOSCOPY  2008  . OTHER SURGICAL HISTORY  2002   ear surgery  . STAPEDES SURGERY Right 2002    There were no vitals filed for this visit.      Subjective Assessment - 10/08/15 1107    Subjective  "I am waiting to be cured"  Pt reports receiving compliments on how much better he is moving.   Patient is accompained by: Family member   Pertinent History see Epic   Patient Stated Goals ride his motorcycle, be active   Currently in Pain? No/denies       Reviewed appropriate continuing fitness activities including stationary bike/ellipital, low weights, full ROM, importance of proper posture, not overdoing it, and focus on extension ex (vs. Flex) to prevent future precautions.  Pt verbalized  understanding.  Reviewed Power over [employer] community group info and this month's topic.   Recommended against tandem walking as pt should emphasize and practice walking with larger base of support/feet apart.  Also instructed pt in proper turning techinque (avoid crossing feet).  Pt verbalized understanding.  Reviewed recommendation to not ride motorcycle due to safety concerns with balance and hx of back pain.  Also, reviewed concerns with riding bicycle (not alone, only level surfaces, not on road).  Pt/wife verbalized understanding.Recommended pt not perform heavy/involved car maintenance alone and recommended pt get help for lifting heavy tires to  decr risk of injury.  Recommended pt break up mowing yard over 2 days to decr risk of back pain with sustained position/posture.  Educated pt on Interior and spatial designer awareness of deficits with PD and therefore, risk of injury is greater for  Some activities due to posture changes/bradykinesia/decr associated trunk movements and importance of taking breaks with repetitive movements and receiving feedback about movement and avoiding "pushing through" riskier activities when he is not feeling his best.  Pt verbalized understanding.  Began checking LTGs and discussing progress in prep for d/c next visit.  Reviewed recommendation for PD therapy screens in approx 6 months.  Pt/wife verbalized understanding.  Reviewed use of PWR! Hands with buttoning.  Pt performed with min v.c.  Dealing cards with thumb with each hand with emphasis on large amplitude.                         OT Short Term Goals - 10/03/15 1021      OT SHORT TERM GOAL #1   Title Pt will be independent with updated PD-specific HEP for coordination, bradykinesia, rigidity.--check STGs 09/24/15   Time 4   Period Weeks   Status Achieved     OT SHORT TERM GOAL #2   Title Pt will improve ability to eat as shown by improving time on PPT#2 by at least 3sec with L hand.   Time 4   Period Weeks   Status Achieved     OT SHORT TERM GOAL #3   Title Pt will improve coordination/functional reaching for ADLs as shown by improving score on box and blocks test by at least 3 with LUE.   Time 4   Status Achieved     OT SHORT TERM GOAL #4   Title Pt will verbalize understanding of memory/cognitive compensation strategies and ways to promote cognitive skills prn.   Time 4   Period Weeks   Status Achieved           OT Long Term Goals - 10/08/15 1117      OT LONG TERM GOAL #1   Title Pt will verbalize understanding of strategies/AE to increase ease with ADLs/IADLs prn.--check LTGs 10/25/15   Time 8   Period Weeks    Status Achieved     OT LONG TERM GOAL #2   Title Pt will improve ability to eat as shown by improving time on PPT#2 by at least 6sec with L hand.   Baseline 21.81sec   Time 8   Period Weeks   Status Partially Met  10/08/15:  approximating, 16.15sec (improved by 5.66sec)     OT LONG TERM GOAL #3   Title Pt will improve functional reaching/coordination for ADLs as shown by improving score by at least 6 on box and blocks test with LUE.   Baseline L-49 blocks (R-59 blocks)   Time 8   Period Weeks  Status On-going     OT LONG TERM GOAL #4   Title Pt will improve ease with dressing as shown by buttoning/unbuttoning 3 buttons in less than 20sec.   Time 8   Period Weeks   Status Achieved  10/08/15:  19.72               Plan - 10/08/15 1814    Clinical Impression Statement Pt has progressed well.  Pt is appropriate for d/c next session with recommendation for PD screening in approx 6 months.   Rehab Potential Good   OT Frequency 2x / week   OT Duration 8 weeks   OT Treatment/Interventions Self-care/ADL training;Therapeutic exercise;Patient/family education;Balance training;Manual Therapy;Neuromuscular education;Energy conservation;Therapeutic exercises;Therapeutic activities;DME and/or AE instruction;Parrafin;Electrical Stimulation;Fluidtherapy;Cognitive remediation/compensation;Visual/perceptual remediation/compensation;Passive range of motion;Contrast Bath;Moist Heat   Plan Arm bike, review HEP prn, check remaining goals and d/c next visit, schedule PD screens in approx 6 months   OT Home Exercise Plan issued coordination HEP, PWR! hands, supine PWR! up, rock, and twist, and prone twist   Consulted and Agree with Plan of Care Patient;Family member/caregiver      Patient will benefit from skilled therapeutic intervention in order to improve the following deficits and impairments:  Decreased cognition, Decreased mobility, Decreased range of motion, Decreased coordination, Impaired UE  functional use, Impaired perceived functional ability, Decreased knowledge of use of DME, Decreased balance, Impaired tone  Visit Diagnosis: Other symptoms and signs involving the nervous system  Other symptoms and signs involving the musculoskeletal system  Abnormal posture  Other lack of coordination  Other abnormalities of gait and mobility  Unsteadiness on feet    Problem List Patient Active Problem List   Diagnosis Date Noted  . Annual physical exam 07/08/2015  . BPH without obstruction/lower urinary tract symptoms 07/08/2015  . Chronic fatigue 10/23/2014  . Chronic anxiety 10/23/2014  . Parkinson's disease ([place]) 09/20/2014  . Malnutrition ([place]) 09/20/2014  . Depression 07/26/2013  . Unsteady gait 07/26/2013  . Orthostatic hypotension 07/26/2013  . Anxiety 06/23/2013  . Chest pain 06/23/2013  . Tremor 05/18/2013  . Occlusion and stenosis of carotid artery without mention of cerebral infarction 08/30/2012    [facility] 10/08/2015, 6:31 PM  [facility] [address] [place], [place], [address] Phone: [phone]   Fax:  [phone]  Name: Joseph Arroyo MRN: [id number] Date of Birth: [birth year]   Vianne Bulls, OTR/L [facility] [address] [place], [place]  [address] [phone] phone [phone] 10/08/15 6:31 PM

## 2015-10-08 NOTE — Patient Instructions (Signed)
  Swallowing Precautions   Small bites/sips Hold liquid in your mouth for a quick second Hard swallow with each bite Chew thoroughly Dry swallow after each bite Alternate liquids and solids  Eliminate distractions with food

## 2015-10-10 ENCOUNTER — Ambulatory Visit: Payer: Medicare Other | Admitting: Occupational Therapy

## 2015-10-10 ENCOUNTER — Encounter: Payer: 59 | Admitting: Speech Pathology

## 2015-10-10 DIAGNOSIS — R293 Abnormal posture: Secondary | ICD-10-CM

## 2015-10-10 DIAGNOSIS — R29818 Other symptoms and signs involving the nervous system: Secondary | ICD-10-CM

## 2015-10-10 DIAGNOSIS — R2689 Other abnormalities of gait and mobility: Secondary | ICD-10-CM

## 2015-10-10 DIAGNOSIS — R29898 Other symptoms and signs involving the musculoskeletal system: Secondary | ICD-10-CM

## 2015-10-10 DIAGNOSIS — R278 Other lack of coordination: Secondary | ICD-10-CM

## 2015-10-11 DIAGNOSIS — R29898 Other symptoms and signs involving the musculoskeletal system: Secondary | ICD-10-CM | POA: Diagnosis not present

## 2015-10-11 NOTE — Therapy (Signed)
[facility] [address] [place], [place], [address] Phone: [phone]   Fax:  [phone]  Occupational Therapy Treatment  Patient Details  Name: Joseph Arroyo MRN: [id number] Date of Birth: 18-Jun-1940 Referring Provider: Dr. Star Age  Encounter Date: 10/10/2015      OT End of Session - 10/10/15 1034    Visit Number 12   Number of Visits 17   Date for OT Re-Evaluation 10/27/15   Authorization Type UHC State, no auth req, no visit limit, G-code   Authorization Time Period G-code next visit   Authorization - Visit Number 12   Authorization - Number of Visits 20   OT Start Time 1020   OT Stop Time 1100   OT Time Calculation (min) 40 min   Activity Tolerance Patient tolerated treatment well   Behavior During Therapy [facility] for tasks assessed/performed      Past Medical History:  Diagnosis Date  . Abnormal involuntary movements(781.0)   . Acute sinusitis, unspecified   . Acute upper respiratory infections of unspecified site   . Allergic rhinitis due to pollen 11/21/2007  . Anxiety state, unspecified 11/28/2008  . Brachial neuritis or radiculitis NOS   . Cervicalgia   . Chest pain, unspecified   . Concussion    age 14 - s/p accident  . Conjunctivitis unspecified   . Contact dermatitis and other eczema due to plants (except food)   . Contusion of toe 09/12/2008  . Costal chondritis   . Cramp of limb   . Depression   . Dysfunction of eustachian tube   . Enthesopathy of unspecified site   . Essential and other specified forms of tremor   . GERD (gastroesophageal reflux disease)    in past  . Headache    migraines - aura - no pain  . Hip, thigh, leg, and ankle, insect bite, nonvenomous, without mention of infection(916.4)   . Lumbago   . Occlusion and stenosis of carotid artery without mention of cerebral infarction   . Other malaise and fatigue 11/21/2007  . Pain in limb   . Seizures ([place])    age 30 - after concussion  . Sleep disturbance, unspecified   . Sprain of hand, unspecified site   . Sprain of neck   . Thrombocytopenia, unspecified ([place])   . Tremors of nervous system    Parkinsonian symptoms  . Ulcer     Past Surgical History:  Procedure Laterality Date  . CATARACT EXTRACTION Right 07/18/14  . CATARACT EXTRACTION W/PHACO Left 08/01/2014   Procedure: CATARACT EXTRACTION PHACO AND INTRAOCULAR LENS PLACEMENT (IOC);  Surgeon: Leandrew Koyanagi, MD;  Location: [facility];  Service: Ophthalmology;  Laterality: Left;  IVA TOPICAL  . COLONOSCOPY  2008  . OTHER SURGICAL HISTORY  2002   ear surgery  . STAPEDES SURGERY Right 2002    There were no vitals filed for this visit.      Subjective Assessment - 10/11/15 1750    Subjective  Pt agres with d/c   Pertinent History see Epic   Patient Stated Goals ride his motorcycle, be active   Currently in Pain? No/denies               Therapist checked progress towards goals and discussed plans for a screen in 6 mons. Dynamic stepping and reaching with large amplitude movements with a cognitive component to follow a sequence, min v.c. Arm bike x 6 mins level 1 for conditioning, min v.c. for speed and  to use of arms equally                  OT Short Term Goals - 2015/11/05 1751      OT SHORT TERM GOAL #1   Title Pt will be independent with updated PD-specific HEP for coordination, bradykinesia, rigidity.--check STGs 09/24/15   Time 4   Period Weeks   Status Achieved     OT SHORT TERM GOAL #2   Title Pt will improve ability to eat as shown by improving time on PPT#2 by at least 3sec with L hand.   Time 4   Period Weeks   Status Achieved     OT SHORT TERM GOAL #3   Title Pt will improve coordination/functional reaching for ADLs as shown by improving score on box and blocks test by at least 3 with LUE.   Time 4   Status Achieved     OT SHORT TERM GOAL #4   Title Pt will verbalize  understanding of memory/cognitive compensation strategies and ways to promote cognitive skills prn.   Time 4   Period Weeks   Status Achieved           OT Long Term Goals - 11-05-15 1751      OT LONG TERM GOAL #1   Title Pt will verbalize understanding of strategies/AE to increase ease with ADLs/IADLs prn.--check LTGs 10/25/15   Time 8   Period Weeks   Status Achieved     OT LONG TERM GOAL #2   Title Pt will improve ability to eat as shown by improving time on PPT#2 by at least 6sec with L hand.   Baseline 21.81sec   Time 8   Period Weeks   Status Partially Met  10/08/15:  approximating, 16.15sec (improved by 5.66sec)     OT LONG TERM GOAL #3   Title Pt will improve functional reaching/coordination for ADLs as shown by improving score by at least 6 on box and blocks test with LUE.   Baseline L-49 blocks (R-59 blocks)   Time 8   Period Weeks   Status Partially Met  51, 56     OT LONG TERM GOAL #4   Title Pt will improve ease with dressing as shown by buttoning/unbuttoning 3 buttons in less than 20sec.   Time 8   Period Weeks   Status Achieved  10/08/15:  19.72               Plan - 2015-11-05 1750    Clinical Impression Statement Pt agrees with d/c from OT recommend screening in 6 mons.   Rehab Potential Good   OT Frequency 2x / week   OT Duration 8 weeks   OT Treatment/Interventions Self-care/ADL training;Therapeutic exercise;Patient/family education;Balance training;Manual Therapy;Neuromuscular education;Energy conservation;Therapeutic exercises;Therapeutic activities;DME and/or AE instruction;Parrafin;Electrical Stimulation;Fluidtherapy;Cognitive remediation/compensation;Visual/perceptual remediation/compensation;Passive range of motion;Contrast Bath;Moist Heat   Plan d/c OT   Consulted and Agree with Plan of Care Patient;Family member/caregiver      Patient will benefit from skilled therapeutic intervention in order to improve the following deficits and  impairments:  Decreased cognition, Decreased mobility, Decreased range of motion, Decreased coordination, Impaired UE functional use, Impaired perceived functional ability, Decreased knowledge of use of DME, Decreased balance, Impaired tone  Visit Diagnosis: Other lack of coordination  Other symptoms and signs involving the nervous system  Other symptoms and signs involving the musculoskeletal system  Abnormal posture  Other abnormalities of gait and mobility      G-Codes - 11-05-2015 1752  Functional Assessment Tool Used PPT#2:  16.16, , 3 button/ unbutton: 19.72 secs   Functional Limitation Carrying, moving and handling objects   Carrying, Moving and Handling Objects Goal Status ([id number]) At least 1 percent but less than 20 percent impaired, limited or restricted   Carrying, Moving and Handling Objects Discharge Status [id number]) At least 20 percent but less than 40 percent impaired, limited or restricted     OCCUPATIONAL THERAPY DISCHARGE SUMMARY   Current functional level related to goals / functional outcomes: Pt demonstrates progress towards goals overall see above.   Remaining deficits: Cognitive deficits, bradykinesia, decreased balance, rigidity, abnormal posture.   Education / Equipment: Pt was educated regarding PD- HEP, memory compensations/ ways to keep thinking skills sharp and provided with education for safety with exercise, ADLs/ IADLs.  Therapist has recommended that pt does not ride a motorcycle due to impaired balance. Pt verbalizes understanding of all education. Plan: Patient agrees to discharge.  Patient goals were partially met. Patient is being discharged due to being pleased with the current functional level.  ?????     Problem List Patient Active Problem List   Diagnosis Date Noted  . Annual physical exam 07/08/2015  . BPH without obstruction/lower urinary tract symptoms 07/08/2015  . Chronic fatigue 10/23/2014  . Chronic anxiety 10/23/2014  .  Parkinson's disease ([place]) 09/20/2014  . Malnutrition ([place]) 09/20/2014  . Depression 07/26/2013  . Unsteady gait 07/26/2013  . Orthostatic hypotension 07/26/2013  . Anxiety 06/23/2013  . Chest pain 06/23/2013  . Tremor 05/18/2013  . Occlusion and stenosis of carotid artery without mention of cerebral infarction 08/30/2012    [facility] 10/11/2015, 5:54 PM Theone Murdoch, OTR/L [phone] Phone: [phone] 5:55 PM 10/11/15 [facility] [address] [place], [place], [address] Phone: [phone]   Fax:  [phone]  Name: ANIAS BARTOL MRN: [id number] Date of Birth: 05/12/1940

## 2015-10-15 ENCOUNTER — Encounter: Payer: 59 | Admitting: Speech Pathology

## 2015-10-25 ENCOUNTER — Encounter: Payer: 59 | Admitting: Speech Pathology

## 2015-12-10 ENCOUNTER — Ambulatory Visit: Payer: Medicare Other | Admitting: Neurology

## 2016-01-08 ENCOUNTER — Other Ambulatory Visit: Payer: Self-pay | Admitting: Family Medicine

## 2016-01-27 ENCOUNTER — Telehealth: Payer: Self-pay | Admitting: Neurology

## 2016-01-27 NOTE — Telephone Encounter (Signed)
Patient is encouraged to keep his [facility] appointment, please relay to patient or wife.

## 2016-01-27 NOTE — Telephone Encounter (Signed)
I spoke to patient. They will keep appt with Dr. Nicki Reaper. After that appointment and seeing what Dr. Nicki Reaper says, they will call us about keeping or postponing appt with [name] on 12/12.

## 2016-01-27 NOTE — Telephone Encounter (Signed)
Pt called says he has an appt with Dr Algis Downs neurology(2nd opinion) 01/28/16 . He wants to know if he should keep this appt. Please call home and his cell 1st. If can't reach him pls call wife (c) [phone]

## 2016-02-05 NOTE — Telephone Encounter (Signed)
Did you receive notes from Dr. Nicki Reaper? I checked care everywhere and I could not find them.

## 2016-02-05 NOTE — Telephone Encounter (Signed)
I reviewed the office note from Dr. Bary Leriche visit from 01/30/2016, notes are scanned in under media. Patient was advised to take Sinemet 2 pills 3 times a day and pramipexole 0.125 mg 4 times a day (2-2-1-1). I suggest we delay our appointment here, so long he is doing well with the current regimen. He was advised to follow-up with Dr. Nicki Reaper in 8 months from his visit, I would be happy to see him in between, about 4 months from now, if he wishes to schedule that appointment, please assist with making the appointment. Please call patient back to notify.

## 2016-02-05 NOTE — Telephone Encounter (Signed)
Patient wants to know if it is necessary for him to keep the appointment with [name] on 02-11-16 since he has already seen Dr. Nicki Reaper and we have received his notes.  Please call and advise. If no answer please leave a VM.

## 2016-02-06 NOTE — Telephone Encounter (Signed)
Pt called back, msg relayed. He was very happy he could wait and an appt was scheduled for 06/11/16. He said he would call Dr Nicki Reaper if he had any questions reg the medication.  FYI

## 2016-02-11 ENCOUNTER — Ambulatory Visit: Payer: Medicare Other | Admitting: Neurology

## 2016-02-17 ENCOUNTER — Telehealth: Payer: Self-pay | Admitting: Family Medicine

## 2016-03-05 ENCOUNTER — Encounter: Payer: Self-pay | Admitting: Family Medicine

## 2016-03-05 ENCOUNTER — Ambulatory Visit (INDEPENDENT_AMBULATORY_CARE_PROVIDER_SITE_OTHER): Payer: Medicare Other | Admitting: Family Medicine

## 2016-03-05 DIAGNOSIS — E559 Vitamin D deficiency, unspecified: Secondary | ICD-10-CM | POA: Diagnosis not present

## 2016-03-05 DIAGNOSIS — L989 Disorder of the skin and subcutaneous tissue, unspecified: Secondary | ICD-10-CM

## 2016-03-05 NOTE — Progress Notes (Signed)
Name: Joseph Arroyo   MRN: [id number]    DOB: 09/13/1940   Date:03/05/2016       Progress Note  Subjective  Chief Complaint  Chief Complaint  Patient presents with  . Follow-up    vitamin D    HPI  Vitamin D Deficiency: Vitamin D level below normal at 28.3 ng/mL, has been on Vitamin D3 50,000 units every week, finished 3 month supply. Will recheck levels today. No symptoms including fatigue, joints ache sometimes but takes omega-3 fish oil which helps.   Pt. would also like to have a mole checked on his back, first noticed many years ago, no change in appearance but sometimes it itches. He frequently goes to the gym and believes the sweating may have irritated it.    Past Medical History:  Diagnosis Date  . Abnormal involuntary movements(781.0)   . Acute sinusitis, unspecified   . Acute upper respiratory infections of unspecified site   . Allergic rhinitis due to pollen 11/21/2007  . Anxiety state, unspecified 11/28/2008  . Brachial neuritis or radiculitis NOS   . Cervicalgia   . Chest pain, unspecified   . Concussion    age 28 - s/p accident  . Conjunctivitis unspecified   . Contact dermatitis and other eczema due to plants (except food)   . Contusion of toe 09/12/2008  . Costal chondritis   . Cramp of limb   . Depression   . Dysfunction of eustachian tube   . Enthesopathy of unspecified site   . Essential and other specified forms of tremor   . GERD (gastroesophageal reflux disease)    in past  . Headache    migraines - aura - no pain  . Hip, thigh, leg, and ankle, insect bite, nonvenomous, without mention of infection(916.4)   . Lumbago   . Occlusion and stenosis of carotid artery without mention of cerebral infarction   . Other malaise and fatigue 11/21/2007  . Pain in limb   . Seizures ([place])    age 37 - after concussion  . Sleep disturbance, unspecified   . Sprain of hand, unspecified site   . Sprain of neck   . Thrombocytopenia, unspecified   .  Tremors of nervous system    Parkinsonian symptoms  . Ulcer [facility])     Past Surgical History:  Procedure Laterality Date  . CATARACT EXTRACTION Right 07/18/14  . CATARACT EXTRACTION W/PHACO Left 08/01/2014   Procedure: CATARACT EXTRACTION PHACO AND INTRAOCULAR LENS PLACEMENT (IOC);  Surgeon: Leandrew Koyanagi, MD;  Location: [facility];  Service: Ophthalmology;  Laterality: Left;  IVA TOPICAL  . COLONOSCOPY  2008  . OTHER SURGICAL HISTORY  2002   ear surgery  . STAPEDES SURGERY Right 2002    Family History  Problem Relation Age of Onset  . Heart failure Mother     Social History   Social History  . Marital status: Married    Spouse name: [name]   . Number of children: 2  . Years of education: 12   Occupational History  .  Retired  . Retired     Social History Main Topics  . Smoking status: Never Smoker  . Smokeless tobacco: Never Used  . Alcohol use No  . Drug use: No  . Sexual activity: Not on file   Other Topics Concern  . Not on file   Social History Narrative   Married to [name], has 2 children   Right handed   Master's plus    2  cups daily     Current Outpatient Prescriptions:  .  B Complex Vitamins (VITAMIN B COMPLEX PO), Take 1 tablet by mouth daily., Disp: , Rfl:  .  Capsicum-Garlic XX123456 MG CAPS, Take 200-300 mg by mouth., Disp: , Rfl:  .  carbidopa-levodopa (SINEMET IR) 25-100 MG tablet, Take 2 tablets by mouth 3 (three) times daily., Disp: , Rfl:  .  fludrocortisone (FLORINEF) 0.1 MG tablet, Take 1 tablet (100 mcg total) by mouth daily. (Patient taking differently: Take 100 mcg by mouth every other day. ), Disp: 90 tablet, Rfl: 3 .  gabapentin (NEURONTIN) 100 MG capsule, TAKE ONE CAPSULE BY MOUTH 2 TIMES A DAY, Disp: , Rfl: 1 .  Ginkgo Biloba (GNP GINGKO BILOBA EXTRACT PO), Take 120 mg by mouth., Disp: , Rfl:  .  HAWTHORNE BERRY PO, Take by mouth daily., Disp: , Rfl:  .  ibuprofen (ADVIL,MOTRIN) 200 MG tablet, Take 200 mg by mouth every 6  (six) hours as needed for moderate pain., Disp: , Rfl:  .  L-Methylfolate-Algae (DEPLIN 15 PO), Take 15 mg by mouth. , Disp: , Rfl:  .  Lutein 10 MG TABS, Take 1 tablet by mouth 3 (three) times daily., Disp: , Rfl:  .  Multiple Vitamins-Minerals (MULTIVITAMIN WITH MINERALS) tablet, Take 1 tablet by mouth daily. Reported on 02/27/2015, Disp: , Rfl:  .  PARoxetine (PAXIL) 20 MG tablet, Take 20 mg by mouth daily., Disp: , Rfl: 0 .  PARoxetine (PAXIL) 40 MG tablet, , Disp: , Rfl:  .  pramipexole (MIRAPEX) 0.125 MG tablet, Take 0.25 mg by mouth 3 (three) times daily., Disp: , Rfl:  .  Saw Palmetto, Serenoa repens, 1000 MG CAPS, Take 2 capsules by mouth daily., Disp: , Rfl:  .  ALPRAZolam (XANAX) 0.25 MG tablet, Take 0.25 mg by mouth 3 (three) times daily. , Disp: , Rfl:  .  Vitamin D, Ergocalciferol, (DRISDOL) 50000 UNITS CAPS capsule, Take 1 capsule (50,000 Units total) by mouth every 7 (seven) days. (Patient not taking: Reported on 03/05/2016), Disp: 30 capsule, Rfl: 3  Allergies  Allergen Reactions  . Prednisone     Hyperactivity   . Wheat Bran Diarrhea     Review of Systems  Constitutional: Positive for malaise/fatigue. Negative for chills, fever and weight loss.  Musculoskeletal: Positive for myalgias.  Skin: Positive for itching. Negative for rash.      Objective  Vitals:   03/05/16 1306  BP: 121/67  Pulse: 64  Resp: 15  Temp: 97.7 F (36.5 C)  TempSrc: Oral  SpO2: 97%  Weight: 159 lb 9.6 oz (72.4 kg)  Height: 5\' 8"  (1.727 m)    Physical Exam  Constitutional: He is oriented to person, place, and time and well-developed, well-nourished, and in no distress.  Cardiovascular: Normal rate, regular rhythm, S1 normal, S2 normal and normal heart sounds.   Pulmonary/Chest: Effort normal and breath sounds normal. No respiratory distress. He has no wheezes. He has no rhonchi.  Neurological: He is alert and oriented to person, place, and time.  Skin: Lesion noted.     Round,  well circumscribed papular non pruiritic lesion on the right lower abdominal wall/flank area.  Psychiatric: Mood, memory, affect and judgment normal.  Nursing note and vitals reviewed.     Assessment & Plan  1. Vitamin D insufficiency Repeat levels of vitamin D - Vitamin D (25 hydroxy)  2. Skin lesion Likely benign, no children size, we'll continue to monitor.   Manmeet Arzola Asad A. [facility]  [facility] 03/05/2016 1:17 PM

## 2016-03-06 LAB — VITAMIN D 25 HYDROXY (VIT D DEFICIENCY, FRACTURES): Vit D, 25-Hydroxy: 24 ng/mL — ABNORMAL LOW (ref 30–100)

## 2016-03-11 ENCOUNTER — Telehealth: Payer: Self-pay

## 2016-03-11 MED ORDER — VITAMIN D (ERGOCALCIFEROL) 1.25 MG (50000 UNIT) PO CAPS: 50000.0000 [IU] | ORAL_CAPSULE | ORAL | 0 refills | Status: DC

## 2016-03-11 NOTE — Telephone Encounter (Signed)
Patient has been notified of lab results and a prescription for vitamin D3 50,000 units take 1 capsule once a week for 12 weeks has been sent to [facility] per Dr. Manuella Ghazi, pt has been notified and verbalized understanding

## 2016-04-28 ENCOUNTER — Ambulatory Visit: Payer: Medicare Other

## 2016-04-28 ENCOUNTER — Ambulatory Visit: Payer: 59 | Admitting: Physical Therapy

## 2016-04-28 ENCOUNTER — Ambulatory Visit: Payer: 59 | Admitting: Occupational Therapy

## 2016-06-05 ENCOUNTER — Telehealth: Payer: Self-pay | Admitting: Family Medicine

## 2016-06-05 ENCOUNTER — Other Ambulatory Visit: Payer: Self-pay | Admitting: Family Medicine

## 2016-06-05 NOTE — Telephone Encounter (Signed)
Pt took his last Vit D tab today and would like to know what he needs to do now that he is out? Please advise. Pt does have an appt next month.

## 2016-06-05 NOTE — Telephone Encounter (Signed)
He should take OTC vitamin D for the remainder of the month until his appointment when we'll repeat his vitamin D levels

## 2016-06-08 NOTE — Telephone Encounter (Signed)
Pt informed and understands instruction

## 2016-06-11 ENCOUNTER — Ambulatory Visit: Payer: Medicare Other | Attending: Neurology

## 2016-06-11 ENCOUNTER — Ambulatory Visit: Payer: 59 | Admitting: Physical Therapy

## 2016-06-11 ENCOUNTER — Ambulatory Visit: Payer: Medicare Other | Admitting: Occupational Therapy

## 2016-06-11 ENCOUNTER — Ambulatory Visit: Payer: Medicare Other | Admitting: Physical Therapy

## 2016-06-11 ENCOUNTER — Ambulatory Visit: Payer: Medicare Other | Admitting: Neurology

## 2016-06-11 ENCOUNTER — Ambulatory Visit: Payer: 59 | Admitting: Occupational Therapy

## 2016-06-11 ENCOUNTER — Ambulatory Visit: Payer: Medicare Other

## 2016-06-11 DIAGNOSIS — R471 Dysarthria and anarthria: Secondary | ICD-10-CM | POA: Insufficient documentation

## 2016-06-11 DIAGNOSIS — R2689 Other abnormalities of gait and mobility: Secondary | ICD-10-CM | POA: Insufficient documentation

## 2016-06-11 DIAGNOSIS — R29818 Other symptoms and signs involving the nervous system: Secondary | ICD-10-CM

## 2016-06-11 NOTE — Therapy (Signed)
[facility] [address] [place], [place], [address] Phone: [phone]   Fax:  [phone]  Patient Details  Name: Joseph Arroyo MRN: [id number] Date of Birth: [birth year] Referring Provider:  Roselee Nova, MD  Encounter Date: 06/11/2016 Occupational Therapy Parkinson's Disease Screen  Hand dominance:  right   Physical Performance Test item #2 (simulated eating):  10.16 sec  9-hole peg test:    RUE  20.90  sec        LUE  25.03 sec  Change in ability to perform ADLs/IADLs:  Pt reports an improvement in ADL performance. Pt reports riding his motorcycle. Therapist recommended pt does not ride his motorcycle due to risk for injury. Pt's handwriting is a good size and 100% legibile. Pt does not require occupational therapy services at this time.  Recommended occupational therapy screen in   6 mons [name] 06/11/2016, 8:30 AM  [facility] [address] [place], [place], [address] Phone: [phone]   Fax:  [phone]

## 2016-06-11 NOTE — Therapy (Signed)
[facility] [address] [place], [place], [address] Phone: [phone]   Fax:  [phone]  Patient Details  Name: Joseph Arroyo MRN: [id number] Date of Birth: 05-Nov-1940 Referring Provider: Star Age, MD  Encounter Date: 06/11/2016  Speech Therapy Parkinson's Disease Screen   Decibel Level today: 72dB  (WNL=70-72 dB) with sound level meter 30cm away from pt's mouth. Pt's conversational volume is WNL.  Pt has reported rare difficulty in swallowing. Pt and wife were educated re: signs and symptoms of swallowing difficulty and ways to practically slow down with meals.   Pt does does not require speech therapy services at this time. Recommend ST screen in another 6 months.    [facility] ,[place], [place]  06/11/2016, 8:23 AM  [facility] [address] [place], [place], [address] Phone: [phone]   Fax:  [phone]

## 2016-06-11 NOTE — Therapy (Signed)
[facility] [address] [place], [place], [address] Phone: [phone]   Fax:  [phone]  Patient Details  Name: Joseph Arroyo MRN: [id number] Date of Birth: 13-Apr-1940 Referring Provider:  Roselee Nova, MD  Encounter Date: 06/11/2016   Physical Therapy Parkinson's Disease Screen   Timed Up and Go test:  9.59 sec  10 meter walk test:  6.87 sec (4.77 ft/sec)  5 time sit to stand test:  8.31 sec     Patient does not require Physical Therapy services at this time.  Recommend Physical Therapy screen in 6 months.    MARRIOTT,AMY W. 06/11/2016, 8:33 AM  Frazier Butt., PT  [facility] [address] [place], [place], [address] Phone: [phone]   Fax:  [phone]

## 2016-07-13 ENCOUNTER — Ambulatory Visit (INDEPENDENT_AMBULATORY_CARE_PROVIDER_SITE_OTHER): Payer: Medicare Other

## 2016-07-13 ENCOUNTER — Encounter: Payer: Medicare Other | Admitting: Family Medicine

## 2016-07-13 VITALS — BP 116/56 | HR 60 | Temp 97.2°F | Ht 68.0 in | Wt 158.2 lb

## 2016-07-13 DIAGNOSIS — Z23 Encounter for immunization: Secondary | ICD-10-CM | POA: Diagnosis not present

## 2016-07-13 DIAGNOSIS — Z0000 Encounter for general adult medical examination without abnormal findings: Secondary | ICD-10-CM

## 2016-07-13 NOTE — Progress Notes (Signed)
Subjective:   Joseph Arroyo is a 76 y.o. male who presents for Medicare Annual/Subsequent preventive examination.  Review of Systems:  N/A  Cardiac Risk Factors include: advanced age (>63men, >53 women);male gender     Objective:    Vitals: BP (!) 116/56 (BP Location: Left Arm)   Pulse 60   Temp 97.2 F (36.2 C) (Oral)   Ht 5\' 8"  (1.727 m)   Wt 158 lb 3.2 oz (71.8 kg)   BMI 24.05 kg/m   Body mass index is 24.05 kg/m.  Tobacco History  Smoking Status  . Never Smoker  Smokeless Tobacco  . Never Used     Counseling given: Not Answered   Past Medical History:  Diagnosis Date  . Abnormal involuntary movements(781.0)   . Acute sinusitis, unspecified   . Acute upper respiratory infections of unspecified site   . Allergic rhinitis due to pollen 11/21/2007  . Anxiety state, unspecified 11/28/2008  . Brachial neuritis or radiculitis NOS   . Cervicalgia   . Chest pain, unspecified   . Concussion    age 43 - s/p accident  . Conjunctivitis unspecified   . Contact dermatitis and other eczema due to plants (except food)   . Contusion of toe 09/12/2008  . Costal chondritis   . Cramp of limb   . Depression   . Dysfunction of eustachian tube   . Enthesopathy of unspecified site   . Essential and other specified forms of tremor   . GERD (gastroesophageal reflux disease)    in past  . Headache    migraines - aura - no pain  . Hip, thigh, leg, and ankle, insect bite, nonvenomous, without mention of infection(916.4)   . Lumbago   . Occlusion and stenosis of carotid artery without mention of cerebral infarction   . Other malaise and fatigue 11/21/2007  . Pain in limb   . Seizures ([place])    age 23 - after concussion  . Sleep disturbance, unspecified   . Sprain of hand, unspecified site   . Sprain of neck   . Thrombocytopenia, unspecified ([place])   . Tremors of nervous system    Parkinsonian symptoms  . Ulcer    Past Surgical History:  Procedure Laterality Date    . CATARACT EXTRACTION Right 07/18/14  . CATARACT EXTRACTION W/PHACO Left 08/01/2014   Procedure: CATARACT EXTRACTION PHACO AND INTRAOCULAR LENS PLACEMENT (IOC);  Surgeon: Leandrew Koyanagi, MD;  Location: [facility];  Service: Ophthalmology;  Laterality: Left;  IVA TOPICAL  . COLONOSCOPY  2008  . OTHER SURGICAL HISTORY  2002   ear surgery  . STAPEDES SURGERY Right 2002   Family History  Problem Relation Age of Onset  . Heart failure Mother    History  Sexual Activity  . Sexual activity: Not on file    Outpatient Encounter Prescriptions as of 07/13/2016  Medication Sig  . B Complex Vitamins (VITAMIN B COMPLEX PO) Take 1 tablet by mouth daily.  . Capsicum-Garlic 297-989 MG CAPS Take 200-300 mg by mouth.  . carbidopa-levodopa (SINEMET IR) 25-100 MG tablet Take 2 tablets by mouth 3 (three) times daily.  Marland Kitchen gabapentin (NEURONTIN) 100 MG capsule TAKE ONE CAPSULE BY MOUTH 2 TIMES A DAY  . Ginkgo Biloba (GNP GINGKO BILOBA EXTRACT PO) Take 120 mg by mouth.  Marland Kitchen HAWTHORNE BERRY PO Take by mouth daily.  Marland Kitchen ibuprofen (ADVIL,MOTRIN) 200 MG tablet Take 200 mg by mouth every 6 (six) hours as needed for moderate pain.  Marland Kitchen L-Methylfolate-Algae (DEPLIN  15 PO) Take 15 mg by mouth.   . Lutein 10 MG TABS Take 1 tablet by mouth 3 (three) times daily.  . Omega-3 1000 MG CAPS Take by mouth.  Marland Kitchen PARoxetine (PAXIL) 20 MG tablet Take 20 mg by mouth daily.  Marland Kitchen PARoxetine (PAXIL) 40 MG tablet   . pramipexole (MIRAPEX) 0.125 MG tablet Take 0.25 mg by mouth 3 (three) times daily.  . Saw Palmetto, Serenoa repens, 1000 MG CAPS Take 2 capsules by mouth daily.  Marland Kitchen ALPRAZolam (XANAX) 0.25 MG tablet Take 0.25 mg by mouth 3 (three) times daily.   . [DISCONTINUED] fludrocortisone (FLORINEF) 0.1 MG tablet Take 1 tablet (100 mcg total) by mouth daily. (Patient not taking: Reported on 07/13/2016)  . [DISCONTINUED] Multiple Vitamins-Minerals (MULTIVITAMIN WITH MINERALS) tablet Take 1 tablet by mouth daily. Reported on  02/27/2015  . [DISCONTINUED] Vitamin D, Ergocalciferol, (DRISDOL) 50000 UNITS CAPS capsule Take 1 capsule (50,000 Units total) by mouth every 7 (seven) days. (Patient not taking: Reported on 03/05/2016)  . [DISCONTINUED] Vitamin D, Ergocalciferol, (DRISDOL) 50000 units CAPS capsule Take 1 capsule (50,000 Units total) by mouth once a week. For 12 weeks (Patient not taking: Reported on 07/13/2016)   No facility-administered encounter medications on file as of 07/13/2016.     Activities of Daily Living In your present state of health, do you have any difficulty performing the following activities: 07/13/2016 03/05/2016  Hearing? Tempie Donning  Vision? N Y  Difficulty concentrating or making decisions? Y N  Walking or climbing stairs? N N  Dressing or bathing? N N  Doing errands, shopping? N N  Preparing Food and eating ? N -  Using the Toilet? N -  In the past six months, have you accidently leaked urine? N -  Do you have problems with loss of bowel control? N -  Managing your Medications? N -  Managing your Finances? N -  Housekeeping or managing your Housekeeping? N -  Some recent data might be hidden    Patient Care Team: Roselee Nova, MD as PCP - General (Family Medicine) Christene Lye, MD (General Surgery) Tempie Hoist, MD as Referring Physician (Neurology)   Assessment:     Exercise Activities and Dietary recommendations Current Exercise Habits: Structured exercise class, Type of exercise: strength training/weights;stretching;Other - see comments;walking (bicycle), Time (Minutes): > 60, Frequency (Times/Week): 3, Weekly Exercise (Minutes/Week): 0, Intensity: Moderate, Exercise limited by: None identified  Goals    . Increase water intake          Recommend increasing water intake to 4-6 glasses of water a day.      Fall Risk Fall Risk  07/13/2016 03/05/2016 08/06/2015 04/09/2015 02/27/2015  Falls in the past year? Yes No No No No  Number falls in past yr: 1 - - - -  Injury  with Fall? No - - - -  Risk for fall due to : - - - Impaired balance/gait -   Depression Screen PHQ 2/9 Scores 07/13/2016 03/05/2016 02/27/2015 10/23/2014  PHQ - 2 Score 0 0 0 0    Cognitive Function        Immunization History  Administered Date(s) Administered  . Pneumococcal Polysaccharide-23 01/26/2011   Screening Tests Health Maintenance  Topic Date Due  . PNA vac Low Risk Adult (2 of 2 - PCV13) 01/26/2012  . SLP PLAN OF CARE  02/18/2014  . INFLUENZA VACCINE  09/30/2016  . COLONOSCOPY  08/21/2017  . TETANUS/TDAP  03/02/2020  Plan:  I have personally reviewed and addressed the Medicare Annual Wellness questionnaire and have noted the following in the patient's chart:  A. Medical and social history B. Use of alcohol, tobacco or illicit drugs  C. Current medications and supplements D. Functional ability and status E.  Nutritional status F.  Physical activity G. Advance directives H. List of other physicians I.  Hospitalizations, surgeries, and ER visits in previous 12 months J.  [place] such as hearing and vision if needed, cognitive and depression L. Referrals and appointments - none  In addition, I have reviewed and discussed with patient certain preventive protocols, quality metrics, and best practice recommendations. A written personalized care plan for preventive services as well as general preventive health recommendations were provided to patient.  See attached scanned questionnaire for additional information.   Signed,  Fabio Neighbors, LPN Nurse Health Advisor   MD Recommendations: None. I, as supervising physician, have reviewed the nurse health advisor's Medicare Wellness Visit note for this patient and concur with the findings and recommendations listed above.  Signed Syed Asad A. Manuella Ghazi MD Attending Physician.

## 2016-07-13 NOTE — Patient Instructions (Signed)
Joseph Arroyo , Thank you for taking time to come for your Medicare Wellness Visit. I appreciate your ongoing commitment to your health goals. Please review the following plan we discussed and let me know if I can assist you in the future.   Screening recommendations/referrals: Colonoscopy: completed 08/22/07, due 07/2017 Recommended yearly ophthalmology/optometry visit for glaucoma screening and checkup Recommended yearly dental visit for hygiene and checkup  Vaccinations: Influenza vaccine: due 10/2016 Pneumococcal vaccine: completed series Tdap vaccine: completed 03/2010, due 03/2020 Shingles vaccine: declined   Advanced directives: Please bring a copy of your POA (Power of [place]) and/or Living Will to your next appointment.   Conditions/risks identified: Recommend increasing water intake to 4-6 glasses a day.  Next appointment: 07/24/16 @ 10 AM  Preventive Care 65 Years and Older, Male Preventive care refers to lifestyle choices and visits with your health care provider that can promote health and wellness. What does preventive care include?  A yearly physical exam. This is also called an annual well check.  Dental exams once or twice a year.  Routine eye exams. Ask your health care provider how often you should have your eyes checked.  Personal lifestyle choices, including:  Daily care of your teeth and gums.  Regular physical activity.  Eating a healthy diet.  Avoiding tobacco and drug use.  Limiting alcohol use.  Practicing safe sex.  Taking low doses of aspirin every day.  Taking vitamin and mineral supplements as recommended by your health care provider. What happens during an annual well check? The services and screenings done by your health care provider during your annual well check will depend on your age, overall health, lifestyle risk factors, and family history of disease. Counseling  Your health care provider may ask you questions about  your:  Alcohol use.  Tobacco use.  Drug use.  Emotional well-being.  Home and relationship well-being.  Sexual activity.  Eating habits.  History of falls.  Memory and ability to understand (cognition).  Work and work Statistician. Screening  You may have the following tests or measurements:  Height, weight, and BMI.  Blood pressure.  Lipid and cholesterol levels. These may be checked every 5 years, or more frequently if you are over 24 years old.  Skin check.  Lung cancer screening. You may have this screening every year starting at age 49 if you have a 30-pack-year history of smoking and currently smoke or have quit within the past 15 years.  Fecal occult blood test (FOBT) of the stool. You may have this test every year starting at age 90.  Flexible sigmoidoscopy or colonoscopy. You may have a sigmoidoscopy every 5 years or a colonoscopy every 10 years starting at age 30.  Prostate cancer screening. Recommendations will vary depending on your family history and other risks.  Hepatitis C blood test.  Hepatitis B blood test.  Sexually transmitted disease (STD) testing.  Diabetes screening. This is done by checking your blood sugar (glucose) after you have not eaten for a while (fasting). You may have this done every 1-3 years.  Abdominal aortic aneurysm (AAA) screening. You may need this if you are a current or former smoker.  Osteoporosis. You may be screened starting at age 24 if you are at high risk. Talk with your health care provider about your test results, treatment options, and if necessary, the need for more tests. Vaccines  Your health care provider may recommend certain vaccines, such as:  Influenza vaccine. This is recommended every year.  Tetanus, diphtheria, and acellular pertussis (Tdap, Td) vaccine. You may need a Td booster every 10 years.  Zoster vaccine. You may need this after age 3.  Pneumococcal 13-valent conjugate (PCV13) vaccine.  One dose is recommended after age 82.  Pneumococcal polysaccharide (PPSV23) vaccine. One dose is recommended after age 60. Talk to your health care provider about which screenings and vaccines you need and how often you need them. This information is not intended to replace advice given to you by your health care provider. Make sure you discuss any questions you have with your health care provider. Document Released: 03/15/2015 Document Revised: 11/06/2015 Document Reviewed: 12/18/2014 Elsevier Interactive Patient Education  2017 [place] Prevention in the Home Falls can cause injuries. They can happen to people of all ages. There are many things you can do to make your home safe and to help prevent falls. What can I do on the outside of my home?  Regularly fix the edges of walkways and driveways and fix any cracks.  Remove anything that might make you trip as you walk through a door, such as a raised step or threshold.  Trim any bushes or trees on the path to your home.  Use bright outdoor lighting.  Clear any walking paths of anything that might make someone trip, such as rocks or tools.  Regularly check to see if handrails are loose or broken. Make sure that both sides of any steps have handrails.  Any raised decks and porches should have guardrails on the edges.  Have any leaves, snow, or ice cleared regularly.  Use sand or salt on walking paths during winter.  Clean up any spills in your garage right away. This includes oil or grease spills. What can I do in the bathroom?  Use night lights.  Install grab bars by the toilet and in the tub and shower. Do not use towel bars as grab bars.  Use non-skid mats or decals in the tub or shower.  If you need to sit down in the shower, use a plastic, non-slip stool.  Keep the floor dry. Clean up any water that spills on the floor as soon as it happens.  Remove soap buildup in the tub or shower regularly.  Attach bath  mats securely with double-sided non-slip rug tape.  Do not have throw rugs and other things on the floor that can make you trip. What can I do in the bedroom?  Use night lights.  Make sure that you have a light by your bed that is easy to reach.  Do not use any sheets or blankets that are too big for your bed. They should not hang down onto the floor.  Have a firm chair that has side arms. You can use this for support while you get dressed.  Do not have throw rugs and other things on the floor that can make you trip. What can I do in the kitchen?  Clean up any spills right away.  Avoid walking on wet floors.  Keep items that you use a lot in easy-to-reach places.  If you need to reach something above you, use a strong step stool that has a grab bar.  Keep electrical cords out of the way.  Do not use floor polish or wax that makes floors slippery. If you must use wax, use non-skid floor wax.  Do not have throw rugs and other things on the floor that can make you trip. What can I do  with my stairs?  Do not leave any items on the stairs.  Make sure that there are handrails on both sides of the stairs and use them. Fix handrails that are broken or loose. Make sure that handrails are as long as the stairways.  Check any carpeting to make sure that it is firmly attached to the stairs. Fix any carpet that is loose or worn.  Avoid having throw rugs at the top or bottom of the stairs. If you do have throw rugs, attach them to the floor with carpet tape.  Make sure that you have a light switch at the top of the stairs and the bottom of the stairs. If you do not have them, ask someone to add them for you. What else can I do to help prevent falls?  Wear shoes that:  Do not have high heels.  Have rubber bottoms.  Are comfortable and fit you well.  Are closed at the toe. Do not wear sandals.  If you use a stepladder:  Make sure that it is fully opened. Do not climb a closed  stepladder.  Make sure that both sides of the stepladder are locked into place.  Ask someone to hold it for you, if possible.  Clearly mark and make sure that you can see:  Any grab bars or handrails.  First and last steps.  Where the edge of each step is.  Use tools that help you move around (mobility aids) if they are needed. These include:  Canes.  Walkers.  Scooters.  Crutches.  Turn on the lights when you go into a dark area. Replace any light bulbs as soon as they burn out.  Set up your furniture so you have a clear path. Avoid moving your furniture around.  If any of your floors are uneven, fix them.  If there are any pets around you, be aware of where they are.  Review your medicines with your doctor. Some medicines can make you feel dizzy. This can increase your chance of falling. Ask your doctor what other things that you can do to help prevent falls. This information is not intended to replace advice given to you by your health care provider. Make sure you discuss any questions you have with your health care provider. Document Released: 12/13/2008 Document Revised: 07/25/2015 Document Reviewed: 03/23/2014 Elsevier Interactive Patient Education  2017 [employer].

## 2016-07-24 ENCOUNTER — Ambulatory Visit
Admission: RE | Admit: 2016-07-24 | Discharge: 2016-07-24 | Disposition: A | Discharge status: Home / Self Care | Payer: Medicare Other | Source: Ambulatory Visit | Attending: Family Medicine | Admitting: Family Medicine

## 2016-07-24 ENCOUNTER — Ambulatory Visit (INDEPENDENT_AMBULATORY_CARE_PROVIDER_SITE_OTHER): Payer: Medicare Other | Admitting: Family Medicine

## 2016-07-24 ENCOUNTER — Encounter: Payer: Self-pay | Admitting: Family Medicine

## 2016-07-24 VITALS — BP 110/70 | HR 64 | Temp 97.5°F | Resp 16 | Ht 68.0 in | Wt 153.0 lb

## 2016-07-24 DIAGNOSIS — W19XXXA Unspecified fall, initial encounter: Secondary | ICD-10-CM | POA: Insufficient documentation

## 2016-07-24 DIAGNOSIS — Z125 Encounter for screening for malignant neoplasm of prostate: Secondary | ICD-10-CM

## 2016-07-24 DIAGNOSIS — M25562 Pain in left knee: Secondary | ICD-10-CM | POA: Diagnosis present

## 2016-07-24 DIAGNOSIS — Z1211 Encounter for screening for malignant neoplasm of colon: Secondary | ICD-10-CM

## 2016-07-24 DIAGNOSIS — S8992XA Unspecified injury of left lower leg, initial encounter: Secondary | ICD-10-CM

## 2016-07-24 DIAGNOSIS — Z0000 Encounter for general adult medical examination without abnormal findings: Secondary | ICD-10-CM

## 2016-07-24 LAB — CBC WITH DIFFERENTIAL/PLATELET
BASOS PCT: 2 %
Basophils Absolute: 102 cells/uL (ref 0–200)
EOS ABS: 255 {cells}/uL (ref 15–500)
Eosinophils Relative: 5 %
HEMATOCRIT: 42.7 % (ref 38.5–50.0)
Hemoglobin: 14 g/dL (ref 13.2–17.1)
Lymphocytes Relative: 25 %
Lymphs Abs: 1275 cells/uL (ref 850–3900)
MCH: 29.3 pg (ref 27.0–33.0)
MCHC: 32.8 g/dL (ref 32.0–36.0)
MCV: 89.3 fL (ref 80.0–100.0)
MPV: 9.8 fL (ref 7.5–12.5)
Monocytes Absolute: 357 cells/uL (ref 200–950)
Monocytes Relative: 7 %
NEUTROS ABS: 3111 {cells}/uL (ref 1500–7800)
Neutrophils Relative %: 61 %
Platelets: 173 10*3/uL (ref 140–400)
RBC: 4.78 MIL/uL (ref 4.20–5.80)
RDW: 13.8 % (ref 11.0–15.0)
WBC: 5.1 10*3/uL (ref 3.8–10.8)

## 2016-07-24 LAB — TSH: TSH: 1.57 mIU/L (ref 0.40–4.50)

## 2016-07-24 NOTE — Progress Notes (Signed)
Name: BRANCE DARTT   MRN: [id number]    DOB: December 15, 1940   Date:07/24/2016       Progress Note  Subjective  Chief Complaint  Chief Complaint  Patient presents with  . Annual Exam    HPI  Pt. Presents for second part of Annual Wellness Exam.  His last colonoscopy was in 2009, no records available and does not know when he should undergo repeat colon cancer screening.  He is due for prostate cancer screening.    Past Medical History:  Diagnosis Date  . Abnormal involuntary movements(781.0)   . Acute sinusitis, unspecified   . Acute upper respiratory infections of unspecified site   . Allergic rhinitis due to pollen 11/21/2007  . Anxiety state, unspecified 11/28/2008  . Brachial neuritis or radiculitis NOS   . Cervicalgia   . Chest pain, unspecified   . Concussion    age 18 - s/p accident  . Conjunctivitis unspecified   . Contact dermatitis and other eczema due to plants (except food)   . Contusion of toe 09/12/2008  . Costal chondritis   . Cramp of limb   . Depression   . Dysfunction of eustachian tube   . Enthesopathy of unspecified site   . Essential and other specified forms of tremor   . GERD (gastroesophageal reflux disease)    in past  . Headache    migraines - aura - no pain  . Hip, thigh, leg, and ankle, insect bite, nonvenomous, without mention of infection(916.4)   . Lumbago   . Occlusion and stenosis of carotid artery without mention of cerebral infarction   . Other malaise and fatigue 11/21/2007  . Pain in limb   . Seizures ([place])    age 76 - after concussion  . Sleep disturbance, unspecified   . Sprain of hand, unspecified site   . Sprain of neck   . Thrombocytopenia, unspecified ([place])   . Tremors of nervous system    Parkinsonian symptoms  . Ulcer     Past Surgical History:  Procedure Laterality Date  . CATARACT EXTRACTION Right 07/18/14  . CATARACT EXTRACTION W/PHACO Left 08/01/2014   Procedure: CATARACT EXTRACTION PHACO AND INTRAOCULAR  LENS PLACEMENT (IOC);  Surgeon: Leandrew Koyanagi, MD;  Location: [facility];  Service: Ophthalmology;  Laterality: Left;  IVA TOPICAL  . COLONOSCOPY  2008  . OTHER SURGICAL HISTORY  2002   ear surgery  . STAPEDES SURGERY Right 2002    Family History  Problem Relation Age of Onset  . Heart failure Mother     Social History   Social History  . Marital status: Married    Spouse name: [name]   . Number of children: 2  . Years of education: 12   Occupational History  .  Retired  . Retired     Social History Main Topics  . Smoking status: Never Smoker  . Smokeless tobacco: Never Used  . Alcohol use No  . Drug use: No  . Sexual activity: Not on file   Other Topics Concern  . Not on file   Social History Narrative   Married to [name], has 2 children   Right handed   Master's plus    2 cups daily     Current Outpatient Prescriptions:  .  ALPRAZolam (XANAX) 0.25 MG tablet, Take 0.25 mg by mouth 3 (three) times daily. , Disp: , Rfl:  .  B Complex Vitamins (VITAMIN B COMPLEX PO), Take 1 tablet by mouth daily., Disp: ,  Rfl:  .  Capsicum-Garlic 188-416 MG CAPS, Take 200-300 mg by mouth., Disp: , Rfl:  .  carbidopa-levodopa (SINEMET IR) 25-100 MG tablet, Take 2 tablets by mouth 3 (three) times daily., Disp: , Rfl:  .  gabapentin (NEURONTIN) 100 MG capsule, TAKE ONE CAPSULE BY MOUTH 2 TIMES A DAY, Disp: , Rfl: 1 .  Ginkgo Biloba (GNP GINGKO BILOBA EXTRACT PO), Take 120 mg by mouth., Disp: , Rfl:  .  HAWTHORNE BERRY PO, Take by mouth daily., Disp: , Rfl:  .  ibuprofen (ADVIL,MOTRIN) 200 MG tablet, Take 200 mg by mouth every 6 (six) hours as needed for moderate pain., Disp: , Rfl:  .  L-Methylfolate-Algae (DEPLIN 15 PO), Take 15 mg by mouth. , Disp: , Rfl:  .  Lutein 10 MG TABS, Take 1 tablet by mouth 3 (three) times daily., Disp: , Rfl:  .  Omega-3 1000 MG CAPS, Take by mouth., Disp: , Rfl:  .  PARoxetine (PAXIL) 20 MG tablet, Take 20 mg by mouth daily., Disp: , Rfl: 0 .   PARoxetine (PAXIL) 40 MG tablet, , Disp: , Rfl:  .  pramipexole (MIRAPEX) 0.125 MG tablet, Take 0.25 mg by mouth 3 (three) times daily., Disp: , Rfl:  .  Saw Palmetto, Serenoa repens, 1000 MG CAPS, Take 2 capsules by mouth daily., Disp: , Rfl:   Allergies  Allergen Reactions  . Prednisone     Hyperactivity   . Wheat Bran Diarrhea     Review of Systems  Constitutional: Positive for malaise/fatigue. Negative for chills and fever.  HENT: Positive for congestion (has yellowish colored mucus when blowing his nose). Negative for ear pain, sinus pain and sore throat.   Eyes: Negative for blurred vision and double vision.  Respiratory: Negative for cough, sputum production and shortness of breath.   Cardiovascular: Negative for chest pain, palpitations and leg swelling.  Gastrointestinal: Negative for blood in stool, constipation, diarrhea, nausea and vomiting.  Genitourinary: Negative for dysuria, frequency, hematuria and urgency.  Musculoskeletal: Positive for back pain (occasional low back pain). Negative for joint pain and neck pain.  Neurological: Negative for dizziness and headaches.  Psychiatric/Behavioral: Negative for depression. The patient is not nervous/anxious.      Objective  Vitals:   07/24/16 1002  BP: 110/70  Pulse: 64  Resp: 16  Temp: 97.5 F (36.4 C)  TempSrc: Oral  SpO2: 95%  Weight: 153 lb (69.4 kg)  Height: 5\' 8"  (1.727 m)    Physical Exam  Constitutional: He is oriented to person, place, and time and well-developed, well-nourished, and in no distress.  HENT:  Head: Normocephalic and atraumatic.  Eyes: Pupils are equal, round, and reactive to light.  Cardiovascular: Normal rate and regular rhythm.   Pulmonary/Chest: Effort normal and breath sounds normal.  Abdominal: Soft. Bowel sounds are normal.  Genitourinary: Rectum normal, testes/scrotum normal and penis normal. Prostate is enlarged (moderatley enlarged, no tenderness, no change from last year).   Musculoskeletal: He exhibits no edema.       Left knee: He exhibits no swelling and no effusion. Tenderness found.       Right ankle: He exhibits no swelling.       Left ankle: He exhibits no swelling.  Mild tenderness to palpation over the anterior left knee including the patella, crepitus with flexion and extension  Neurological: He is alert and oriented to person, place, and time.  Skin: Skin is warm and dry.  Psychiatric: Mood, memory, affect and judgment normal.  Nursing note  and vitals reviewed.     Assessment & Plan  1. Annual physical exam Obtain age-appropriate laboratory screenings - CBC with Differential/Platelet - COMPLETE METABOLIC PANEL WITH GFR - Lipid panel - TSH - VITAMIN D 25 Hydroxy (Vit-D Deficiency, Fractures)  2. Injury of left knee, initial encounter Obtain x-ray of left knee as patient started experiencing pain after he fell on his wooden deck in ice, treat accordingly - DG Knee Complete 4 Views Left; Future  3. Screening for colon cancer  - Cologuard  4. Screening for prostate cancer  - PSA   Noemi Ishmael Asad A. [facility] 07/24/2016 10:08 AM

## 2016-07-25 LAB — LIPID PANEL
CHOLESTEROL: 126 mg/dL (ref <200)
HDL: 50 mg/dL (ref >40)
LDL CALC: 64 mg/dL (ref <100)
TRIGLYCERIDES: 60 mg/dL (ref <150)
Total CHOL/HDL Ratio: 2.5 Ratio (ref <5.0)
VLDL: 12 mg/dL (ref <30)

## 2016-07-25 LAB — PSA: PSA: 1.8 ng/mL (ref <=4.0)

## 2016-07-25 LAB — COMPLETE METABOLIC PANEL WITH GFR
ALT: 8 U/L — AB (ref 9–46)
AST: 20 U/L (ref 10–35)
Albumin: 3.6 g/dL (ref 3.6–5.1)
Alkaline Phosphatase: 49 U/L (ref 40–115)
BILIRUBIN TOTAL: 0.6 mg/dL (ref 0.2–1.2)
BUN: 15 mg/dL (ref 7–25)
CALCIUM: 8.6 mg/dL (ref 8.6–10.3)
CO2: 25 mmol/L (ref 20–31)
CREATININE: 0.91 mg/dL (ref 0.70–1.18)
Chloride: 103 mmol/L (ref 98–110)
GFR, Est Non African American: 83 mL/min (ref >=60)
Glucose, Bld: 84 mg/dL (ref 65–99)
Potassium: 4 mmol/L (ref 3.5–5.3)
Sodium: 139 mmol/L (ref 135–146)
TOTAL PROTEIN: 6.1 g/dL (ref 6.1–8.1)

## 2016-07-25 LAB — VITAMIN D 25 HYDROXY (VIT D DEFICIENCY, FRACTURES): Vit D, 25-Hydroxy: 33 ng/mL (ref 30–100)

## 2016-08-18 LAB — COLOGUARD

## 2016-08-24 ENCOUNTER — Ambulatory Visit: Payer: Medicare Other | Admitting: Neurology

## 2016-09-15 ENCOUNTER — Ambulatory Visit: Payer: Medicare Other | Admitting: Neurology

## 2016-09-16 ENCOUNTER — Telehealth: Payer: Self-pay

## 2016-09-16 NOTE — Telephone Encounter (Signed)
Okay, thank you, nothing further needed.

## 2016-09-16 NOTE — Telephone Encounter (Signed)
Pt's wife called back, pt said that he won't come tomorrow if it is not needed, he will follow up with Dr. Nicki Reaper in September and follow up with [name] after that appt. Pt says that he will see [name] after the appt with Dr. Nicki Reaper in September because [name] is his primary neurologist and they want to continue this care.

## 2016-09-16 NOTE — Telephone Encounter (Signed)
I called pt on both the home and cell number to discuss his appt tomorrow with [name]. No answer, left a message asking him to call me back.

## 2016-09-16 NOTE — Telephone Encounter (Signed)
[name], pt's wife, per DPR, returned my call. I advised her that [name] reviewed pt's last OV notes with Dr. Nicki Reaper at [facility] and has offered pt to not come to the appt tomorrow with [name] if pt feels like he would continue following with Dr. Nicki Reaper since pt is doing well with Dr. Bary Leriche recommendations. It appears that pt may have an appt with Dr. Nicki Reaper in September. Pt's wife says that she will discuss this with the pt and let us know what is decided.

## 2016-09-17 ENCOUNTER — Ambulatory Visit: Payer: Medicare Other | Admitting: Neurology

## 2016-09-29 ENCOUNTER — Telehealth: Payer: Self-pay | Admitting: Family Medicine

## 2016-09-29 NOTE — Telephone Encounter (Signed)
Pt states he has not gotten his [employer] results. He states he is concerned because he has already paid his bill and is wanting to know what is taking so long to receive his results. Please call pt back.

## 2016-09-29 NOTE — Telephone Encounter (Signed)
Patient has been notified of cologuard results

## 2016-10-27 ENCOUNTER — Ambulatory Visit: Payer: Medicare Other | Admitting: Occupational Therapy

## 2016-10-27 ENCOUNTER — Ambulatory Visit: Payer: Medicare Other

## 2017-01-25 ENCOUNTER — Ambulatory Visit: Payer: Medicare Other | Admitting: Family Medicine

## 2017-01-25 ENCOUNTER — Encounter: Payer: Self-pay | Admitting: Family Medicine

## 2017-01-25 VITALS — BP 96/60 | HR 70 | Temp 97.5°F | Resp 14 | Wt 156.4 lb

## 2017-01-25 DIAGNOSIS — R059 Cough, unspecified: Secondary | ICD-10-CM

## 2017-01-25 DIAGNOSIS — R05 Cough: Secondary | ICD-10-CM

## 2017-01-25 DIAGNOSIS — G8929 Other chronic pain: Secondary | ICD-10-CM | POA: Diagnosis not present

## 2017-01-25 DIAGNOSIS — J3489 Other specified disorders of nose and nasal sinuses: Secondary | ICD-10-CM | POA: Diagnosis not present

## 2017-01-25 DIAGNOSIS — M25562 Pain in left knee: Secondary | ICD-10-CM | POA: Diagnosis not present

## 2017-01-25 MED ORDER — FLUTICASONE PROPIONATE 50 MCG/ACT NA SUSP: 2.0000 | Freq: Every day | NASAL | 0 refills | Status: DC

## 2017-01-25 NOTE — Progress Notes (Signed)
Name: Joseph Arroyo   MRN: [id number]    DOB: 1940/05/27   Date:01/25/2017       Progress Note  Subjective  Chief Complaint  Chief Complaint  Patient presents with  . Follow-up    6 months     Cough  This is a recurrent problem. The current episode started 1 to 4 weeks ago (2+ weeks ago). The problem has been gradually improving. The cough is productive of sputum. Associated symptoms include a fever (sometimes he feels as if he has a low grade fever but did not check his temperature) and nasal congestion (mucus when he blows his nose.). Pertinent negatives include no ear pain, headaches, postnasal drip, sore throat, shortness of breath or wheezing. Treatments tried: occasionally takes Ibuprofen for relief.  Knee Pain   There was no injury mechanism. The pain is present in the left knee. The quality of the pain is described as aching. The pain is moderate. The pain has been fluctuating since onset. Pertinent negatives include no inability to bear weight, loss of sensation, numbness or tingling. He reports no foreign bodies present. The symptoms are aggravated by movement and palpation. He has tried NSAIDs for the symptoms.    Past Medical History:  Diagnosis Date  . Abnormal involuntary movements(781.0)   . Acute sinusitis, unspecified   . Acute upper respiratory infections of unspecified site   . Allergic rhinitis due to pollen 11/21/2007  . Anxiety state, unspecified 11/28/2008  . Brachial neuritis or radiculitis NOS   . Cervicalgia   . Chest pain, unspecified   . Concussion    age 60 - s/p accident  . Conjunctivitis unspecified   . Contact dermatitis and other eczema due to plants (except food)   . Contusion of toe 09/12/2008  . Costal chondritis   . Cramp of limb   . Depression   . Dysfunction of eustachian tube   . Enthesopathy of unspecified site   . Essential and other specified forms of tremor   . GERD (gastroesophageal reflux disease)    in past  . Headache    migraines - aura - no pain  . Hip, thigh, leg, and ankle, insect bite, nonvenomous, without mention of infection(916.4)   . Lumbago   . Occlusion and stenosis of carotid artery without mention of cerebral infarction   . Other malaise and fatigue 11/21/2007  . Pain in limb   . Seizures ([place])    age 89 - after concussion  . Sleep disturbance, unspecified   . Sprain of hand, unspecified site   . Sprain of neck   . Thrombocytopenia, unspecified ([place])   . Tremors of nervous system    Parkinsonian symptoms  . Ulcer     Past Surgical History:  Procedure Laterality Date  . CATARACT EXTRACTION Right 07/18/14  . CATARACT EXTRACTION W/PHACO Left 08/01/2014   Procedure: CATARACT EXTRACTION PHACO AND INTRAOCULAR LENS PLACEMENT (IOC);  Surgeon: Leandrew Koyanagi, MD;  Location: [facility];  Service: Ophthalmology;  Laterality: Left;  IVA TOPICAL  . COLONOSCOPY  2008  . OTHER SURGICAL HISTORY  2002   ear surgery  . STAPEDES SURGERY Right 2002    Family History  Problem Relation Age of Onset  . Heart failure Mother     Social History   Socioeconomic History  . Marital status: Married    Spouse name: [name]   . Number of children: 2  . Years of education: 42  . Highest education level: Not on file  Social  Needs  . Financial resource strain: Not on file  . Food insecurity - worry: Not on file  . Food insecurity - inability: Not on file  . Transportation needs - medical: Not on file  . Transportation needs - non-medical: Not on file  Occupational History    Employer: RETIRED  . Occupation: Retired   Tobacco Use  . Smoking status: Never Smoker  . Smokeless tobacco: Never Used  Substance and Sexual Activity  . Alcohol use: No    Alcohol/week: 0.0 oz  . Drug use: No  . Sexual activity: Not Currently  Other Topics Concern  . Not on file  Social History Narrative   Married to [name], has 2 children   Right handed   Master's plus    2 cups daily     Current Outpatient  Medications:  .  B Complex Vitamins (VITAMIN B COMPLEX PO), Take 1 tablet by mouth daily., Disp: , Rfl:  .  Capsicum-Garlic 026-378 MG CAPS, Take 200-300 mg by mouth., Disp: , Rfl:  .  carbidopa-levodopa (SINEMET IR) 25-100 MG tablet, Take 2 tablets by mouth 3 (three) times daily., Disp: , Rfl:  .  gabapentin (NEURONTIN) 100 MG capsule, TAKE ONE CAPSULE BY MOUTH 2 TIMES A DAY, Disp: , Rfl: 1 .  Ginkgo Biloba (GNP GINGKO BILOBA EXTRACT PO), Take 120 mg by mouth., Disp: , Rfl:  .  HAWTHORNE BERRY PO, Take by mouth daily., Disp: , Rfl:  .  ibuprofen (ADVIL,MOTRIN) 200 MG tablet, Take 200 mg by mouth every 6 (six) hours as needed for moderate pain., Disp: , Rfl:  .  L-Methylfolate-Algae (DEPLIN 15 PO), Take 15 mg by mouth. , Disp: , Rfl:  .  Lutein 10 MG TABS, Take 1 tablet by mouth 3 (three) times daily., Disp: , Rfl:  .  Omega-3 1000 MG CAPS, Take by mouth., Disp: , Rfl:  .  PARoxetine (PAXIL) 20 MG tablet, Take 20 mg by mouth daily., Disp: , Rfl: 0 .  PARoxetine (PAXIL) 40 MG tablet, , Disp: , Rfl:  .  pramipexole (MIRAPEX) 0.125 MG tablet, Take 0.25 mg by mouth 3 (three) times daily., Disp: , Rfl:  .  Saw Palmetto, Serenoa repens, 1000 MG CAPS, Take 2 capsules by mouth daily., Disp: , Rfl:  .  ALPRAZolam (XANAX) 0.25 MG tablet, Take 0.25 mg by mouth 3 (three) times daily. , Disp: , Rfl:  .  omega-3 acid ethyl esters (LOVAZA) 1 g capsule, Take 1 capsule by mouth daily., Disp: , Rfl:   Allergies  Allergen Reactions  . Prednisone     Hyperactivity   . Wheat Bran Diarrhea     Review of Systems  Constitutional: Positive for fever (sometimes he feels as if he has a low grade fever but did not check his temperature).  HENT: Negative for ear pain, postnasal drip and sore throat.   Respiratory: Positive for cough. Negative for shortness of breath and wheezing.   Neurological: Negative for tingling, numbness and headaches.    Objective  Vitals:   01/25/17 0936  BP: 96/60  Pulse: 70    Resp: 14  Temp: (!) 97.5 F (36.4 C)  TempSrc: Oral  SpO2: 97%  Weight: 156 lb 6.4 oz (70.9 kg)    Physical Exam  Constitutional: He is well-developed, well-nourished, and in no distress.  HENT:  Head: Normocephalic and atraumatic.  Right Ear: Tympanic membrane and ear canal normal.  Left Ear: Tympanic membrane and ear canal normal.  Nose: Rhinorrhea present. Right  sinus exhibits no maxillary sinus tenderness and no frontal sinus tenderness. Left sinus exhibits no maxillary sinus tenderness and no frontal sinus tenderness.  Mouth/Throat: Oropharynx is clear and moist. No posterior oropharyngeal edema or posterior oropharyngeal erythema.  Nasal turbinate hypertrophied.   Cardiovascular: Normal rate, regular rhythm and normal heart sounds.  No murmur heard. Pulmonary/Chest: Effort normal and breath sounds normal.  Musculoskeletal:       Left knee: He exhibits decreased range of motion. He exhibits no swelling, no deformity and no erythema. Tenderness found. Medial joint line tenderness noted.       Legs: Nursing note and vitals reviewed.       Assessment & Plan  1. Rhinorrhea  Likely secondary to postnasal drainage, recommend Flonase for allergy relief - fluticasone (FLONASE ALLERGY RELIEF) 50 MCG/ACT nasal spray; Place 2 sprays into both nostrils daily.  Dispense: 16 g; Refill: 0  2. Cough As above, coughing is likely from postnasal drainage, treatment as above  3. Chronic pain of left knee Patient is interested in "natural" treatments, have discussed the role of turmeric in lowering the inflammation, advised to stay active as tolerated, may need referral to orthopedics in the future   Johnelle Tafolla Asad A. [facility] 01/25/2017 9:48 AM

## 2017-01-26 ENCOUNTER — Ambulatory Visit: Payer: Medicare Other | Attending: Family Medicine | Admitting: Occupational Therapy

## 2017-01-26 ENCOUNTER — Ambulatory Visit: Payer: Medicare Other | Admitting: Physical Therapy

## 2017-01-26 ENCOUNTER — Ambulatory Visit: Payer: Medicare Other

## 2017-01-26 DIAGNOSIS — R1312 Dysphagia, oropharyngeal phase: Secondary | ICD-10-CM

## 2017-01-26 DIAGNOSIS — R2689 Other abnormalities of gait and mobility: Secondary | ICD-10-CM | POA: Insufficient documentation

## 2017-01-26 DIAGNOSIS — R471 Dysarthria and anarthria: Secondary | ICD-10-CM

## 2017-01-26 DIAGNOSIS — R29818 Other symptoms and signs involving the nervous system: Secondary | ICD-10-CM | POA: Insufficient documentation

## 2017-01-26 NOTE — Therapy (Signed)
[facility] [address] [place], [place], [address] Phone: [phone]   Fax:  [phone]  Patient Details  Name: Joseph Arroyo MRN: [id number] Date of Birth: [birth year] Referring Provider:  Star Age, MD  Encounter Date: 01/26/2017  Speech Therapy Parkinson's Disease Screen   Decibel Level today: lower 70s dB  (WNL=70-72 dB) with sound level meter 30cm away from pt's mouth. Pt's conversational volume has maintained essentially the same volume since last screening.  Pt has continued to experience frequent difficulty in swallowing nuts, and water. Pt and wife agree with SLP it may be indicated to have another objective swallow study, given pt's now-daily coughing with H2O/liquids. No overt s/s aspiration PNA today, nor were any reported by pt/wife.  Pt would benefit from objective swallowing eval (modified barium swallow or FEES). Pt prefers [facility] for this eval. You may call centralized scheduling at [facility] at [phone] to schedule.  They do one outpt swallowing study each day. The rehab dept number is [phone].    [facility] ,[address], [place]  01/26/2017, 1:57 PM  [facility] [address] [place], [place], [address] Phone: [phone]   Fax:  [phone]

## 2017-01-26 NOTE — Therapy (Signed)
[facility] [address] [place], [place], [address] Phone: [phone]   Fax:  [phone]  Patient Details  Name: Joseph Arroyo MRN: [id number] Date of Birth: 04-14-1940 Referring Provider:  Roselee Nova, MD  Encounter Date: 01/26/2017  Occupational Therapy Parkinson's Disease Screen  Hand dominance:  right   Physical Performance Test item #4 (donning/doffing jacket):  Not tested, but pt observed doffing jacket without difficulty or bradykinesia  Fastening/unfastening 3 buttons in:  25.38 sec  9-hole peg test:    RUE  21.47 sec        LUE  23.75 sec  Box & Blocks Test:   RUE  58 blocks        LUE  55 blocks  Change in ability to perform ADLs/IADLs:  no  Other Comments:  Pt able to name with good legibility/size.  Pt does not require occupational therapy services at this time.  Recommended occupational therapy screen in  approx 6-51months.                                                                                                                                                                                                                                                                                                                                                                                             [facility] 01/26/2017, 1:07 PM  [facility] [address] [place], [place], [address] Phone: [phone]   Fax:  [place], OTR/L [facility] [address] [place], [place]  [phone] phone [phone] 01/26/17  1:28 PM

## 2017-01-26 NOTE — Therapy (Signed)
[facility] [address] [place], [place], [address] Phone: [phone]   Fax:  [phone]  Patient Details  Name: Joseph Arroyo MRN: [id number] Date of Birth: 06/04/1940 Referring Provider:  Roselee Nova, MD  Encounter Date: 01/26/2017   Physical Therapy Parkinson's Disease Screen   Timed Up and Go test: 8.78 sec  10 meter walk test: 7.32 sec (4.48 ft/sec)  5 time sit to stand test: 8.91 sec   Patient does not require Physical Therapy services at this time.  Recommend Physical Therapy screen in 6-9 months.     Kairyn Olmeda W. 01/26/2017, 1:34 PM  Frazier Butt., PT   [facility] [address] [place], [place], [address] Phone: [phone]   Fax:  [phone]

## 2017-02-25 ENCOUNTER — Other Ambulatory Visit: Payer: Self-pay | Admitting: Family Medicine

## 2017-02-25 DIAGNOSIS — J3489 Other specified disorders of nose and nasal sinuses: Secondary | ICD-10-CM

## 2017-04-12 ENCOUNTER — Other Ambulatory Visit: Payer: Self-pay

## 2017-04-12 DIAGNOSIS — J3489 Other specified disorders of nose and nasal sinuses: Secondary | ICD-10-CM

## 2017-04-12 MED ORDER — FLUTICASONE PROPIONATE 50 MCG/ACT NA SUSP: NASAL | 0 refills | Status: DC

## 2017-04-12 NOTE — Telephone Encounter (Signed)
I got a fax from [facility] requesting a refill of this patient's fluticasone nasal spray.  Refill request was sent to Dr. Okey Dupre A. Manuella Ghazi for approval and submission.

## 2017-04-21 ENCOUNTER — Other Ambulatory Visit: Payer: Self-pay

## 2017-04-21 DIAGNOSIS — I6523 Occlusion and stenosis of bilateral carotid arteries: Secondary | ICD-10-CM

## 2017-04-26 ENCOUNTER — Telehealth: Payer: Self-pay

## 2017-04-26 NOTE — Telephone Encounter (Signed)
Called states he does not require referral so he has already setup appt to [facility] ortho  Copied from [place] [phone]. Topic: Referral - Status >> Apr 26, 2017  9:42 AM [place] Olp L wrote: Reason for CRM: Patient would like a recommendation on whether or not there is a preferred location for orthopaedics. He has been having left knee pain for some time that Dr. Manuella Ghazi placed an x-ray order for but it didn't show anything. However, the knee has been bothering him a lot lately. Patient would like a call back at earliest convenience regarding a referral. He say's he will walk in if he doesn't hear anything shortly.

## 2017-04-26 NOTE — Telephone Encounter (Signed)
Please do make a referral to ortho to provider of choice or in his network Thank you

## 2017-05-05 ENCOUNTER — Other Ambulatory Visit: Payer: Self-pay | Admitting: Physician Assistant

## 2017-05-05 DIAGNOSIS — S83222A Peripheral tear of medial meniscus, current injury, left knee, initial encounter: Secondary | ICD-10-CM

## 2017-05-12 ENCOUNTER — Other Ambulatory Visit: Payer: Self-pay

## 2017-05-12 DIAGNOSIS — J3489 Other specified disorders of nose and nasal sinuses: Secondary | ICD-10-CM

## 2017-05-12 MED ORDER — FLUTICASONE PROPIONATE 50 MCG/ACT NA SUSP: NASAL | 8 refills | Status: DC

## 2017-05-13 ENCOUNTER — Ambulatory Visit (INDEPENDENT_AMBULATORY_CARE_PROVIDER_SITE_OTHER): Payer: Medicare Other | Admitting: Vascular Surgery

## 2017-05-13 ENCOUNTER — Ambulatory Visit (INDEPENDENT_AMBULATORY_CARE_PROVIDER_SITE_OTHER): Payer: Medicare Other

## 2017-05-13 ENCOUNTER — Other Ambulatory Visit (INDEPENDENT_AMBULATORY_CARE_PROVIDER_SITE_OTHER): Payer: Self-pay | Admitting: Vascular Surgery

## 2017-05-13 ENCOUNTER — Encounter (INDEPENDENT_AMBULATORY_CARE_PROVIDER_SITE_OTHER): Payer: Self-pay | Admitting: Vascular Surgery

## 2017-05-13 VITALS — BP 96/64 | HR 54 | Resp 16 | Ht 69.0 in | Wt 156.4 lb

## 2017-05-13 DIAGNOSIS — G20 Parkinson's disease: Secondary | ICD-10-CM

## 2017-05-13 DIAGNOSIS — I6523 Occlusion and stenosis of bilateral carotid arteries: Secondary | ICD-10-CM

## 2017-05-13 DIAGNOSIS — F419 Anxiety disorder, unspecified: Secondary | ICD-10-CM | POA: Diagnosis not present

## 2017-05-13 DIAGNOSIS — I779 Disorder of arteries and arterioles, unspecified: Secondary | ICD-10-CM

## 2017-05-13 DIAGNOSIS — I739 Peripheral vascular disease, unspecified: Principal | ICD-10-CM

## 2017-05-13 NOTE — Progress Notes (Signed)
MRN : [id number]  Joseph Arroyo is a 77 y.o. (12/06/1940) male who presents with chief complaint of  Chief Complaint  Patient presents with  . New Patient (Initial Visit)    here to establish care for carotid stenosis  .  History of Present Illness: The patient is seen for evaluation of carotid stenosis. The carotid stenosis was identified remotely and has been followed by Dr Jamal Collin with biannual ultrasounds.  The patient denies amaurosis fugax. There is no recent history of TIA symptoms or focal motor deficits. There is no prior documented CVA.  There is no history of migraine headaches. There is no history of seizures.  The patient is taking enteric-coated aspirin 81 mg daily.  The patient has a history of coronary artery disease, no recent episodes of angina or shortness of breath. The patient denies PAD or claudication symptoms. There is a history of hyperlipidemia which is being treated with a statin.    Current Meds  Medication Sig  . ALPRAZolam (XANAX) 0.25 MG tablet Take 0.25 mg by mouth 3 (three) times daily.   . B Complex Vitamins (VITAMIN B COMPLEX PO) Take 1 tablet by mouth daily.  . Capsicum-Garlic 174-081 MG CAPS Take 200-300 mg by mouth.  . carbidopa-levodopa (SINEMET IR) 25-100 MG tablet Take 2 tablets by mouth 3 (three) times daily.  . fluticasone (FLONASE) 50 MCG/ACT nasal spray SPRAY 2 SPRAYS INTO EACH NOSTRIL EVERY DAY  . gabapentin (NEURONTIN) 100 MG capsule TAKE ONE CAPSULE BY MOUTH 2 TIMES A DAY  . Ginkgo Biloba (GNP GINGKO BILOBA EXTRACT PO) Take 120 mg by mouth.  Marland Kitchen HAWTHORNE BERRY PO Take by mouth daily.  Marland Kitchen ibuprofen (ADVIL,MOTRIN) 200 MG tablet Take 200 mg by mouth every 6 (six) hours as needed for moderate pain.  Mar[place] Kitchen L-Methylfolate-Algae (DEPLIN 15 PO) Take 15 mg by mouth.   . Lutein 10 MG TABS Take 1 tablet by mouth 3 (three) times daily.  . Omega-3 1000 MG CAPS Take by mouth.  . omega-3 acid ethyl esters (LOVAZA) 1 g capsule Take 1 capsule by  mouth daily.  Marland Kitchen PARoxetine (PAXIL) 20 MG tablet Take 20 mg by mouth daily.  Marland Kitchen PARoxetine (PAXIL) 40 MG tablet   . pramipexole (MIRAPEX) 0.125 MG tablet Take 0.25 mg by mouth 3 (three) times daily.  . Saw Palmetto, Serenoa repens, 1000 MG CAPS Take 2 capsules by mouth daily.    Past Medical History:  Diagnosis Date  . Abnormal involuntary movements(781.0)   . Acute sinusitis, unspecified   . Acute upper respiratory infections of unspecified site   . Allergic rhinitis due to pollen 11/21/2007  . Anxiety state, unspecified 11/28/2008  . Brachial neuritis or radiculitis NOS   . Cervicalgia   . Chest pain, unspecified   . Concussion    age 74 - s/p accident  . Conjunctivitis unspecified   . Contact dermatitis and other eczema due to plants (except food)   . Contusion of toe 09/12/2008  . Costal chondritis   . Cramp of limb   . Depression   . Dysfunction of eustachian tube   . Enthesopathy of unspecified site   . Essential and other specified forms of tremor   . GERD (gastroesophageal reflux disease)    in past  . Headache    migraines - aura - no pain  . Hip, thigh, leg, and ankle, insect bite, nonvenomous, without mention of infection(916.4)   . Lumbago   . Occlusion and stenosis of carotid artery without  mention of cerebral infarction   . Other malaise and fatigue 11/21/2007  . Pain in limb   . Seizures ([place])    age 34 - after concussion  . Sleep disturbance, unspecified   . Sprain of hand, unspecified site   . Sprain of neck   . Thrombocytopenia, unspecified ([place])   . Tremors of nervous system    Parkinsonian symptoms  . Ulcer     Past Surgical History:  Procedure Laterality Date  . CATARACT EXTRACTION Right 07/18/14  . CATARACT EXTRACTION W/PHACO Left 08/01/2014   Procedure: CATARACT EXTRACTION PHACO AND INTRAOCULAR LENS PLACEMENT (IOC);  Surgeon: Leandrew Koyanagi, MD;  Location: [facility];  Service: Ophthalmology;  Laterality: Left;  IVA TOPICAL  .  COLONOSCOPY  2008  . OTHER SURGICAL HISTORY  2002   ear surgery  . STAPEDES SURGERY Right 2002    Social History Social History   Tobacco Use  . Smoking status: Never Smoker  . Smokeless tobacco: Never Used  Substance Use Topics  . Alcohol use: No    Alcohol/week: 0.0 oz  . Drug use: No    Family History Family History  Problem Relation Age of Onset  . Heart failure Mother   No family history of bleeding/clotting disorders, porphyria or autoimmune disease   Allergies  Allergen Reactions  . Prednisone     Hyperactivity   . Wheat Bran Diarrhea     REVIEW OF SYSTEMS (Negative unless checked)  Constitutional: [] Weight loss  [] Fever  [] Chills Cardiac: [] Chest pain   [] Chest pressure   [] Palpitations   [] Shortness of breath when laying flat   [] Shortness of breath with exertion. Vascular:  [] Pain in legs with walking   [] Pain in legs at rest  [] History of DVT   [] Phlebitis   [] Swelling in legs   [] Varicose veins   [] Non-healing ulcers Pulmonary:   [] Uses home oxygen   [] Productive cough   [] Hemoptysis   [] Wheeze  [] COPD   [] Asthma Neurologic:  [x] Dizziness   [] Seizures   [] History of stroke   [] History of TIA  [] Aphasia   [] Vissual changes   [] Weakness or numbness in arm   [] Weakness or numbness in leg Musculoskeletal:   [] Joint swelling   [] Joint pain   [] Low back pain Hematologic:  [] Easy bruising  [] Easy bleeding   [] Hypercoagulable state   [] Anemic Gastrointestinal:  [] Diarrhea   [] Vomiting  [] Gastroesophageal reflux/heartburn   [] Difficulty swallowing. Genitourinary:  [] Chronic kidney disease   [] Difficult urination  [] Frequent urination   [] Blood in urine Skin:  [] Rashes   [] Ulcers  Psychological:  [] History of anxiety   []  History of major depression.  Physical Examination  Vitals:   05/13/17 0912  BP: 96/64  Pulse: (!) 54  Resp: 16  Weight: 156 lb 6.4 oz (70.9 kg)  Height: 5\' 9"  (1.753 m)   Body mass index is 23.1 kg/m. Gen: WD/WN, NAD Head: [place]/AT, No  temporalis wasting.  Ear/Nose/Throat: Hearing grossly intact, nares w/o erythema or drainage, poor dentition Eyes: PER, EOMI, sclera nonicteric.  Neck: Supple, no masses.  No bruit or JVD.  Pulmonary:  Good air movement, clear to auscultation bilaterally, no use of accessory muscles.  Cardiac: RRR, normal S1, S2, no Murmurs. Vascular: left carotid bruit Vessel Right Left  Radial Palpable Palpable  PT Palpable Palpable  DP Palpable Palpable  Gastrointestinal: soft, non-distended. No guarding/no peritoneal signs.  Musculoskeletal: M/S 5/5 throughout.  No deformity or atrophy.  Neurologic: CN 2-12 intact. Pain and light touch intact in extremities.  Symmetrical.  Speech is fluent. Motor exam as listed above. Psychiatric: Judgment intact, Mood & affect appropriate for pt's clinical situation. Dermatologic: No rashes or ulcers noted.  No changes consistent with cellulitis. Lymph : No Cervical lymphadenopathy, no lichenification or skin changes of chronic lymphedema.  CBC Lab Results  Component Value Date   WBC 5.1 07/24/2016   HGB 14.0 07/24/2016   HCT 42.7 07/24/2016   MCV 89.3 07/24/2016   PLT 173 07/24/2016    BMET    Component Value Date/Time   NA 139 07/24/2016 1050   NA 143 07/10/2015 0821   K 4.0 07/24/2016 1050   CL 103 07/24/2016 1050   CO2 25 07/24/2016 1050   GLUCOSE 84 07/24/2016 1050   BUN 15 07/24/2016 1050   BUN 13 07/10/2015 0821   CREATININE 0.91 07/24/2016 1050   CALCIUM 8.6 07/24/2016 1050   GFRNONAA 83 07/24/2016 1050   GFRAA >89 07/24/2016 1050   CrCl cannot be calculated (Patient's most recent lab result is older than the maximum 21 days allowed.).  COAG No results found for: INR, PROTIME  Radiology No results found.   Assessment/Plan 1. Bilateral carotid artery stenosis Recommend:  Given the patient's asymptomatic subcritical stenosis no further invasive testing or surgery at this time.  Duplex ultrasound shows <40% stenosis  bilaterally.  Continue antiplatelet therapy as prescribed Continue management of CAD, HTN and Hyperlipidemia Healthy heart diet,  encouraged exercise at least 4 times per week Follow up in 24 months with duplex ultrasound and physical exam based on <50% stenosis of the bilateral carotid artery   2. Anxiety Continue Xanax as already ordered, these medications have been reviewed and there are no changes at this time.   3. Parkinson's disease ([place]) Continue Sinemet as already ordered, these medications have been reviewed and there are no changes at this time.     Hortencia Pilar, MD  05/13/2017 9:35 AM

## 2017-05-14 ENCOUNTER — Encounter (INDEPENDENT_AMBULATORY_CARE_PROVIDER_SITE_OTHER): Payer: Self-pay | Admitting: Vascular Surgery

## 2017-05-16 IMAGING — RF DG SWALLOWING FUNCTION
9 series · 13 of 24 positions shown · non-contrast
Comparison: None in PACs

CLINICAL DATA: The patient reports difficulty swallowing pills and
nuts; history of Parkinson's disease, gastroesophageal reflux

EXAM:
MODIFIED BARIUM SWALLOW
TECHNIQUE: Different consistencies of barium were administered orally to the
patient by the Speech Pathologist. Imaging of the pharynx was
performed in the lateral projection.
FLUOROSCOPY TIME:  Fluoroscopy Time:  1 minutes, 12 seconds
Number of Acquired Images:  9 video loops

[Series 1: run · 2 of 99 frames shown (1 of 9)]
[frame 15/99]
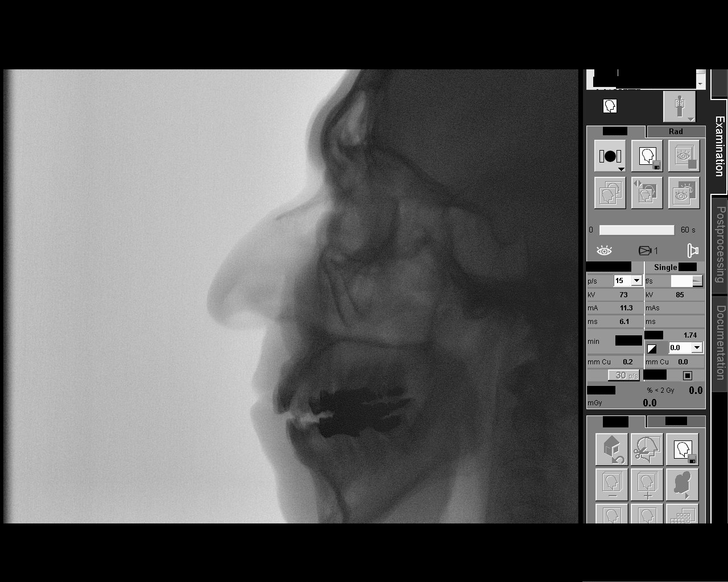
[frame 98/99]
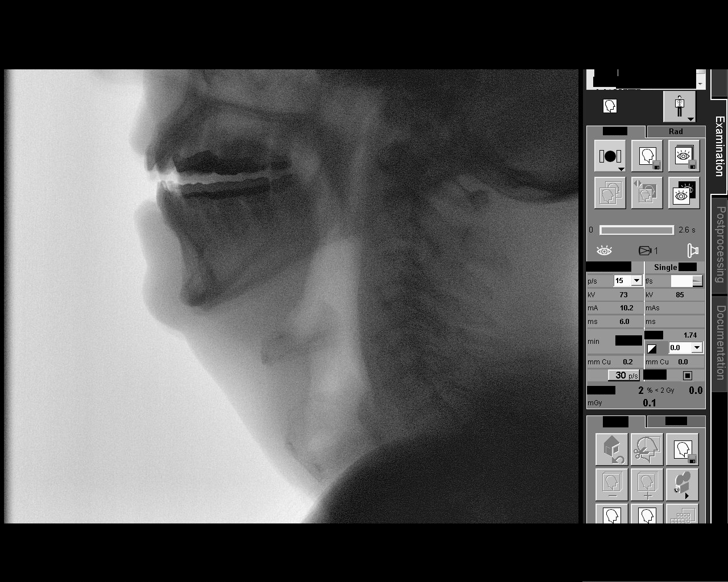

[Series 2: run · 1 of 478 frames shown (2 of 9)]
[frame 240/478]
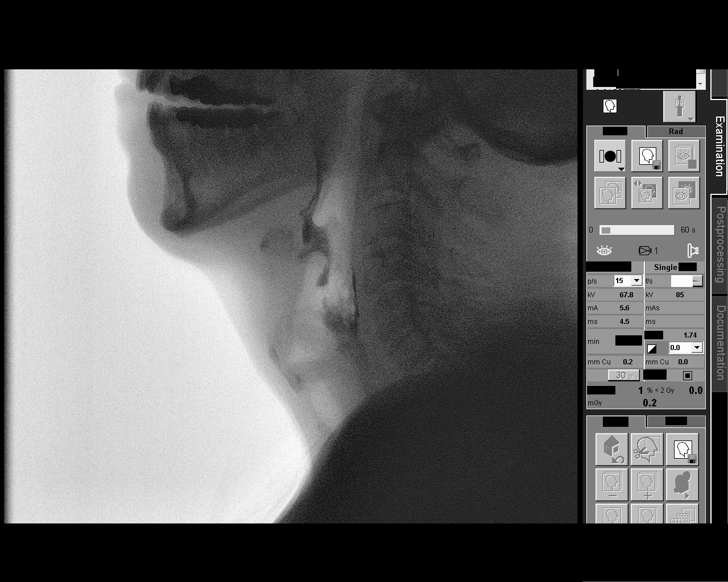

[Series 3: run · 1 of 117 frames shown (3 of 9)]
[frame 59/117]
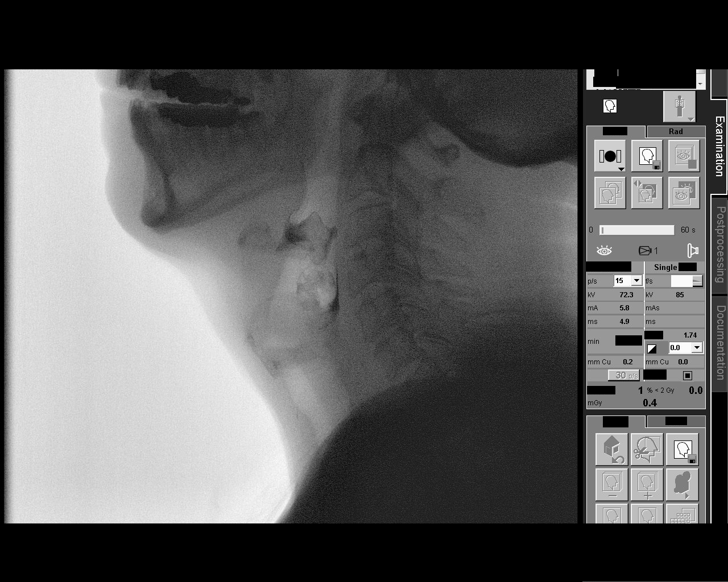

[Series 4: run · 2 of 270 frames shown (4 of 9)]
[frame 41/270]
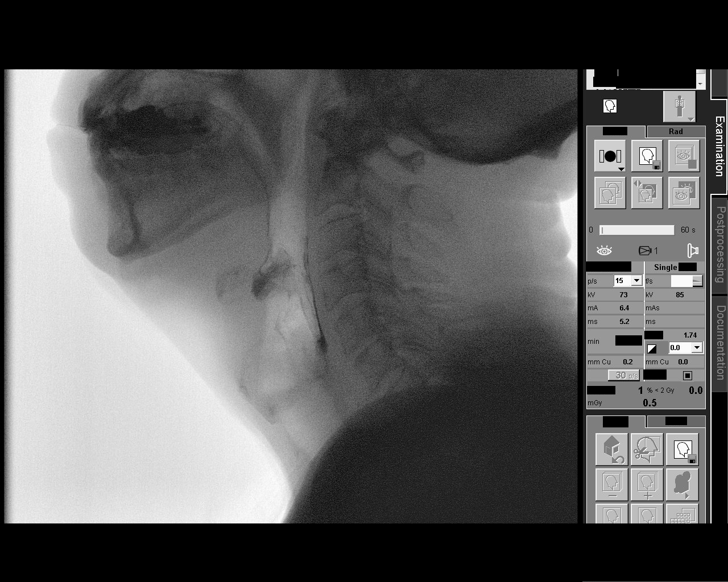
[frame 268/270]
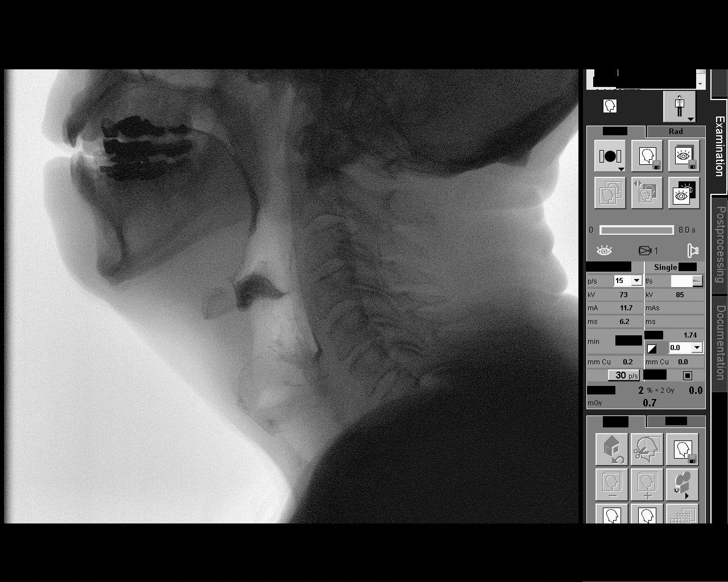

[Series 5: run · 1 of 327 frames shown (5 of 9)]
[frame 164/327]
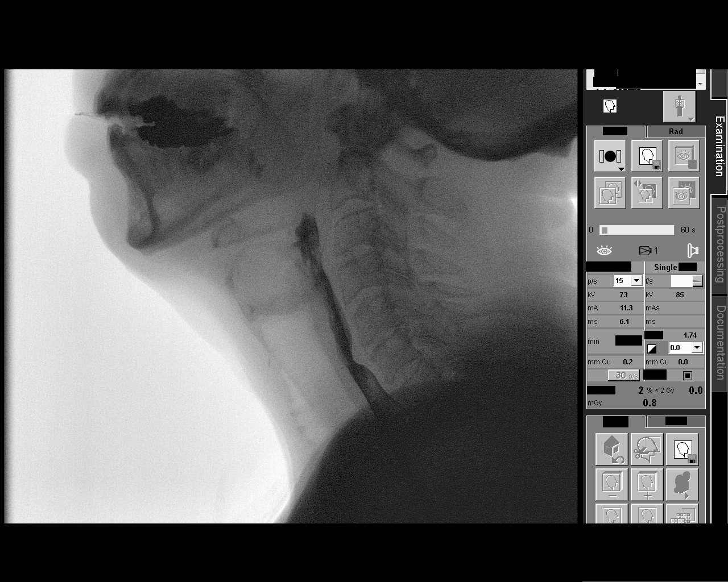

[Series 6: run · 2 of 175 frames shown (6 of 9)]
[frame 27/175]
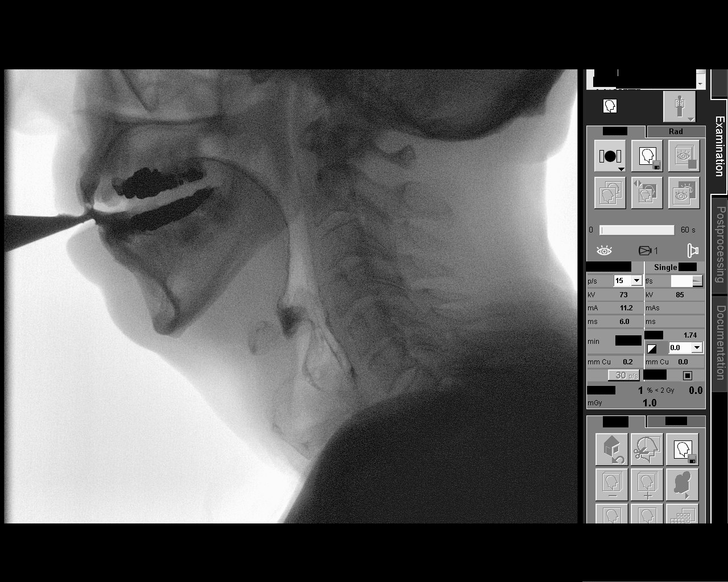
[frame 174/175]
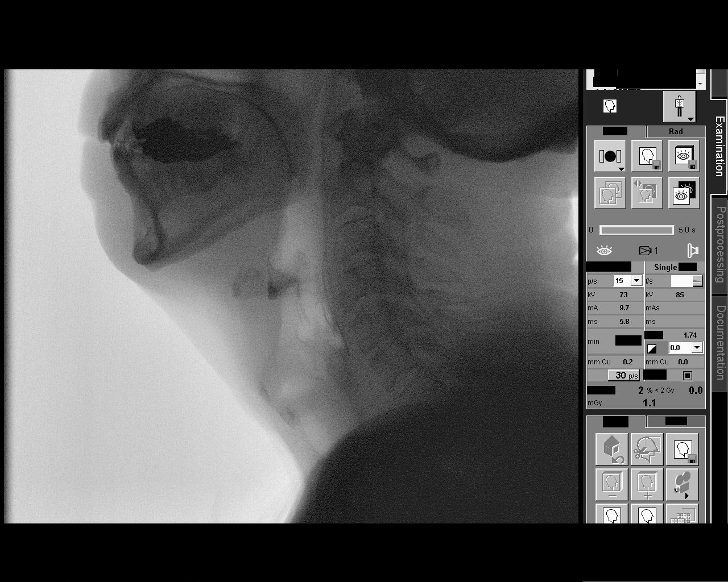

[Series 7: run · 1 of 202 frames shown (7 of 9)]
[frame 172/202]
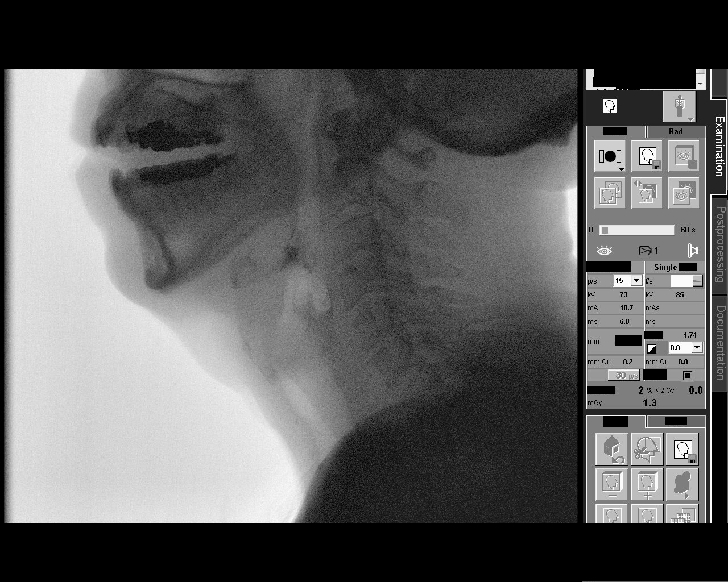

[Series 8: run · 1 of 477 frames shown (8 of 9)]
[frame 230/477]
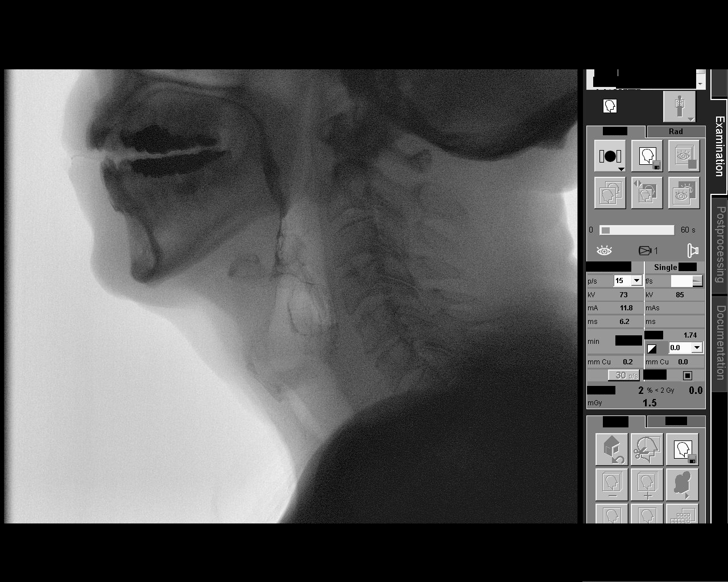

[Series 9: run · 2 of 92 frames shown (9 of 9)]
[frame 14/92]
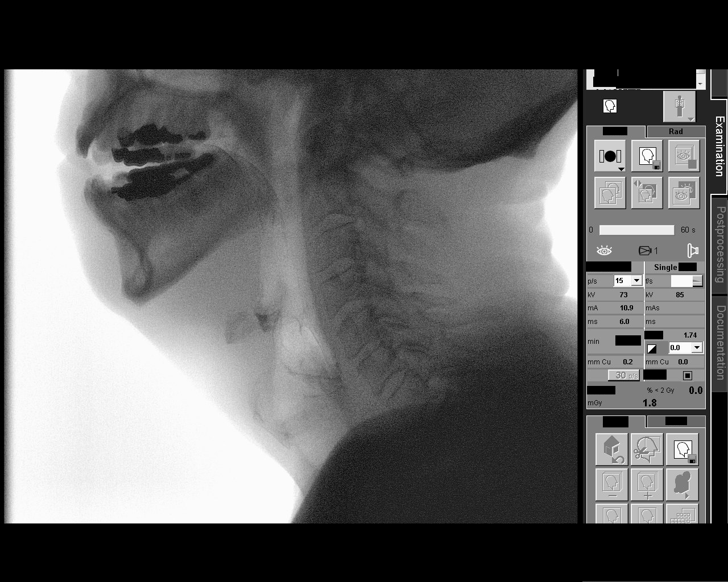
[frame 90/92]
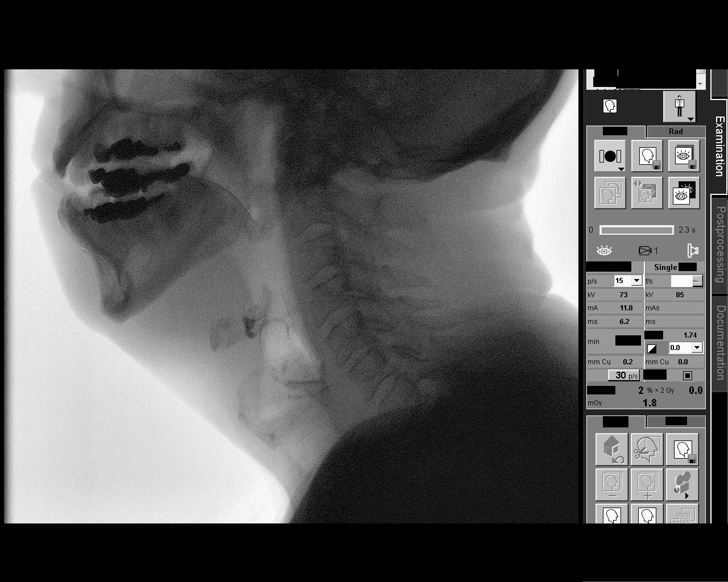

[13 of 24 positions shown; findings below may reference images not displayed]

FINDINGS: Thin liquid- within normal limits

Nectar thick liquid- within normal limits

Kabelo Skhebo Kb?Seilius within normal limits

Kabelo Skhebo Kb?Joshuah with Anthonio Danh - within normal limits
IMPRESSION: No laryngeal penetration of the barium. The patient ingests the
preparations above without difficulty.

Please refer to the Speech Pathologists report for complete details
and recommendations.

## 2017-05-17 ENCOUNTER — Ambulatory Visit
Admission: RE | Admit: 2017-05-17 | Discharge: 2017-05-17 | Disposition: A | Discharge status: Home / Self Care | Payer: Medicare Other | Source: Ambulatory Visit | Attending: Physician Assistant | Admitting: Physician Assistant

## 2017-05-17 DIAGNOSIS — S83282A Other tear of lateral meniscus, current injury, left knee, initial encounter: Secondary | ICD-10-CM | POA: Insufficient documentation

## 2017-05-17 DIAGNOSIS — M25562 Pain in left knee: Secondary | ICD-10-CM | POA: Insufficient documentation

## 2017-05-17 DIAGNOSIS — S83222A Peripheral tear of medial meniscus, current injury, left knee, initial encounter: Secondary | ICD-10-CM | POA: Insufficient documentation

## 2017-05-17 DIAGNOSIS — X58XXXA Exposure to other specified factors, initial encounter: Secondary | ICD-10-CM | POA: Diagnosis not present

## 2017-08-05 ENCOUNTER — Telehealth: Payer: Self-pay | Admitting: Family Medicine

## 2017-08-05 NOTE — Telephone Encounter (Signed)
Patient stated he planned to follow up at [facility] so he can have male practitioner.

## 2017-08-11 ENCOUNTER — Other Ambulatory Visit: Payer: Self-pay | Admitting: Family Medicine

## 2017-08-11 ENCOUNTER — Encounter: Payer: Self-pay | Admitting: Family Medicine

## 2017-08-11 ENCOUNTER — Ambulatory Visit (INDEPENDENT_AMBULATORY_CARE_PROVIDER_SITE_OTHER): Payer: Medicare Other | Admitting: Family Medicine

## 2017-08-11 VITALS — BP 100/64 | HR 56 | Temp 97.6°F | Resp 16 | Ht 69.0 in | Wt 161.0 lb

## 2017-08-11 DIAGNOSIS — G20A1 Parkinson's disease without dyskinesia, without mention of fluctuations: Secondary | ICD-10-CM

## 2017-08-11 DIAGNOSIS — R5382 Chronic fatigue, unspecified: Secondary | ICD-10-CM

## 2017-08-11 DIAGNOSIS — M25562 Pain in left knee: Secondary | ICD-10-CM

## 2017-08-11 DIAGNOSIS — Z7689 Persons encountering health services in other specified circumstances: Secondary | ICD-10-CM

## 2017-08-11 DIAGNOSIS — G20 Parkinson's disease: Secondary | ICD-10-CM

## 2017-08-11 DIAGNOSIS — F419 Anxiety disorder, unspecified: Secondary | ICD-10-CM

## 2017-08-11 DIAGNOSIS — D229 Melanocytic nevi, unspecified: Secondary | ICD-10-CM

## 2017-08-11 DIAGNOSIS — Z0000 Encounter for general adult medical examination without abnormal findings: Secondary | ICD-10-CM

## 2017-08-11 DIAGNOSIS — G8929 Other chronic pain: Secondary | ICD-10-CM | POA: Insufficient documentation

## 2017-08-11 DIAGNOSIS — F3342 Major depressive disorder, recurrent, in full remission: Secondary | ICD-10-CM

## 2017-08-11 DIAGNOSIS — Z79899 Other long term (current) drug therapy: Secondary | ICD-10-CM

## 2017-08-11 DIAGNOSIS — I6523 Occlusion and stenosis of bilateral carotid arteries: Secondary | ICD-10-CM

## 2017-08-11 DIAGNOSIS — N400 Enlarged prostate without lower urinary tract symptoms: Secondary | ICD-10-CM

## 2017-08-11 DIAGNOSIS — E559 Vitamin D deficiency, unspecified: Secondary | ICD-10-CM

## 2017-08-11 DIAGNOSIS — M23322 Other meniscus derangements, posterior horn of medial meniscus, left knee: Secondary | ICD-10-CM | POA: Insufficient documentation

## 2017-08-11 NOTE — Assessment & Plan Note (Signed)
Currently stable without new concerns, without panic. Complicated by tremors and health concerns with Parkinson's disease Followed by Psychiatry (Dr Clovis Pu [facility]) On SSRI Paxil 60mg  (40 + 20mg ) and Xanax regularly

## 2017-08-11 NOTE — Assessment & Plan Note (Addendum)
Currently stable without new concerns Asked to complete PHQ but patient left without completing result today, will re-ask at next visit Followed by Psychiatry (Dr Clovis Pu [facility]) On SSRI Paxil 60mg  (40 + 20mg )

## 2017-08-11 NOTE — Assessment & Plan Note (Signed)
Stable without worsening, able to function and ambulate with modified activity Without new injury or problem Identified on MRI per [facility] Dr Marry Guan Declines surgical intervention at this time prefer to delay
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
## 2017-08-11 NOTE — Assessment & Plan Note (Signed)
Secondary to known chronic L knee medial meniscus tear posterior aspect

## 2017-08-11 NOTE — Patient Instructions (Addendum)
Thank you for coming to the office today.  Pleasure meeting you, and we can review more history in future.  Keep up the good work overall.  Stay tuned for appointment - call them if you do not receive any appointment  [facility]   [place], [place] [id number] Hours: 8AM-5PM Phone: [phone]  Sarina Ser, MD  DUE for FASTING BLOOD WORK (no food or drink after midnight before the lab appointment, only water or coffee without cream/sugar on the morning of)  SCHEDULE "Lab Only" visit in the morning at the clinic for lab draw in 2-3 weeks for blood   - Make sure Lab Only appointment is at about 1 week before your next appointment, so that results will be available  For Lab Results, once available within 2-3 days of blood draw, you can can log in to MyChart online to view your results and a brief explanation. Also, we can discuss results at next follow-up visit.   Please schedule a Follow-up Appointment to: Return in about 3 weeks (around 09/01/2017) for Annual Physical.  If you have any other questions or concerns, please feel free to call the office or send a message through [place]. You may also schedule an earlier appointment if necessary.  Additionally, you may be receiving a survey about your experience at our office within a few days to 1 week by e-mail or mail. We value your feedback.  Nobie Putnam, DO [place]

## 2017-08-11 NOTE — Assessment & Plan Note (Signed)
Stable, chronic problem w/ tremors primarily Followed by [facility] Dr Burton On carbidopa-levodopa, mirapex with improvement Continues to exercise and remain active Follow-up as planned 

## 2017-08-24 ENCOUNTER — Ambulatory Visit (INDEPENDENT_AMBULATORY_CARE_PROVIDER_SITE_OTHER): Payer: Medicare Other

## 2017-08-24 VITALS — BP 120/72 | HR 64 | Temp 97.8°F | Resp 16 | Ht 69.0 in | Wt 161.2 lb

## 2017-08-24 DIAGNOSIS — Z0000 Encounter for general adult medical examination without abnormal findings: Secondary | ICD-10-CM

## 2017-08-24 NOTE — Progress Notes (Signed)
Subjective:   Joseph Arroyo is a 77 y.o. male who presents for Medicare Annual/Subsequent preventive examination.  Review of Systems:   Cardiac Risk Factors include: advanced age (>20men, >42 women);hypertension     Objective:    Vitals: BP 120/72 (BP Location: Right Arm, Patient Position: Sitting)   Pulse 64   Temp 97.8 F (36.6 C) (Oral)   Resp 16   Ht 5\' 9"  (1.753 m)   Wt 161 lb 3.2 oz (73.1 kg)   BMI 23.81 kg/m   Body mass index is 23.81 kg/m.  Advanced Directives 08/24/2017 07/24/2016 07/13/2016 03/05/2016 10/03/2015 09/19/2015 09/18/2015  Does Patient Have a Medical Advance Directive? Yes Yes Yes No Yes Yes Yes  Type of Paramedic of [place];Living will [place];Living will [place];Living will - Living will;Healthcare Power of [place];Living will [place];Living will  Does patient want to make changes to medical advance directive? - - - - No - Patient declined No - Patient declined No - Patient declined  Copy of [place] in Chart? No - copy requested No - copy requested No - copy requested - No - copy requested No - copy requested No - copy requested    Tobacco Social History   Tobacco Use  Smoking Status Never Smoker  Smokeless Tobacco Never Used     Counseling given: Not Answered   Clinical Intake:  Pre-visit preparation completed: Yes  Pain : No/denies pain     Nutritional Status: BMI of 19-24  Normal Nutritional Risks: None Diabetes: No  How often do you need to have someone help you when you read instructions, pamphlets, or other written materials from your doctor or pharmacy?: 1 - Never What is the last grade level you completed in school?: graduate   Interpreter Needed?: No  Information entered by :: Tiffany Hill,LPN   Past Medical History:  Diagnosis Date  . Allergic rhinitis due to pollen 11/21/2007  .  Brachial neuritis or radiculitis NOS   . Cervicalgia   . Concussion    age 9 - s/p accident  . Costal chondritis   . Depression   . Essential and other specified forms of tremor   . GERD (gastroesophageal reflux disease)    in past  . Lumbago   . Occlusion and stenosis of carotid artery without mention of cerebral infarction   . Seizures ([place])    age 89 - after concussion  . Thrombocytopenia, unspecified ([place])    Past Surgical History:  Procedure Laterality Date  . CATARACT EXTRACTION Right 07/18/14  . CATARACT EXTRACTION W/PHACO Left 08/01/2014   Procedure: CATARACT EXTRACTION PHACO AND INTRAOCULAR LENS PLACEMENT (IOC);  Surgeon: Leandrew Koyanagi, MD;  Location: [facility];  Service: Ophthalmology;  Laterality: Left;  IVA TOPICAL  . COLONOSCOPY  2008  . OTHER SURGICAL HISTORY  2002   ear surgery  . STAPEDES SURGERY Right 2002   Family History  Problem Relation Age of Onset  . Heart failure Mother        enlarged heart   . Asthma Father    Social History   Socioeconomic History  . Marital status: Married    Spouse name: Daden Mahany  . Number of children: 2  . Years of education: 44  . Highest education level: Master's degree (e.g., MA, MS, MEng, MEd, MSW, MBA)  Occupational History  . 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 Financial resource strain: Not hard at all  . Food insecurity:    Worry: Never true    Inability: Never true  . Transportation needs:    Medical: No    Non-medical: No  Tobacco Use  . Smoking status: Never Smoker  . Smokeless tobacco: Never Used  Substance and Sexual Activity  . Alcohol use: No    Alcohol/week: 0.0 oz  . Drug use: No  . Sexual activity: Not Currently  Lifestyle  . Physical activity:    Days per week: 3 days    Minutes per session: 150+ min  . Stress: Not at all  Relationships  . Social connections:    Talks on phone: More than three times a week    Gets together:  More than three times a week    Attends religious service: More than 4 times per year    Active member of club or organization: Yes    Attends meetings of clubs or organizations: More than 4 times per year    Relationship status: Married  Other Topics Concern  . Not on file  Social History Narrative   Married to [name], has 2 children, has grandchildren   Right handed   Master's plus   He is born in [place], heritage is [place] (Father's side), [place] (Mother's side)      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     Outpatient Encounter Medications as of 08/24/2017  Medication Sig  . B Complex Vitamins (VITAMIN B COMPLEX PO) Take 1 tablet by mouth daily.  . Capsicum-Garlic 983-382 MG CAPS Take 200-300 mg by mouth.  . carbidopa-levodopa (SINEMET IR) 25-100 MG tablet Take 2 tablets by mouth 3 (three) times daily.  . fluticasone (FLONASE) 50 MCG/ACT nasal spray SPRAY 2 SPRAYS INTO EACH NOSTRIL EVERY DAY  . gabapentin (NEURONTIN) 100 MG capsule TAKE ONE CAPSULE BY MOUTH 2 TIMES A DAY  . Ginkgo Biloba (GNP GINGKO BILOBA EXTRACT PO) Take 120 mg by mouth.  Marland Kitchen HAWTHORNE BERRY PO Take by mouth daily.  Marland Kitchen ibuprofen (ADVIL,MOTRIN) 200 MG tablet Take 200 mg by mouth every 6 (six) hours as needed for moderate pain.  Mar[place] Kitchen L-Methylfolate-Algae (DEPLIN 15 PO) Take 15 mg by mouth.   . Lutein 10 MG TABS Take 1 tablet by mouth 3 (three) times daily.  . Omega-3 1000 MG CAPS Take by mouth.  . omega-3 acid ethyl esters (LOVAZA) 1 g capsule Take 1 capsule by mouth daily.  Marland Kitchen PARoxetine (PAXIL) 20 MG tablet Take 20 mg by mouth daily.  Marland Kitchen PARoxetine (PAXIL) 40 MG tablet   . pramipexole (MIRAPEX) 0.125 MG tablet Take 0.25 mg by mouth 3 (three) times daily.  . Saw Palmetto, Serenoa repens, 1000 MG CAPS Take 2 capsules by mouth daily.   No facility-administered encounter medications on file as of 08/24/2017.     Activities of Daily Living In your present state of health, do you have any difficulty performing the  following activities: 08/24/2017 01/25/2017  Hearing? N N  Vision? N Y  Comment - reading   Difficulty concentrating or making decisions? N N  Walking or climbing stairs? N N  Dressing or bathing? N N  Doing errands, shopping? N N  Preparing Food and eating ? N -  Using the Toilet? N -  In the past six months, have you accidently leaked urine? N -  Do you have problems with loss of bowel control? N -  Managing your Medications? N -  Managing your  Finances? N -  Housekeeping or managing your Housekeeping? N -  Some recent data might be hidden    Patient Care Team: Olin Hauser, DO as PCP - General (Family Medicine) Christene Lye, MD (General Surgery) Star Age, MD as Attending Physician (Neurology) [name], PA-C as Physician Assistant (Physician Assistant) Kem Parkinson, MD as Consulting Physician (Ophthalmology)   Assessment:   This is a routine wellness examination for [place].  Exercise Activities and Dietary recommendations Current Exercise Habits: Structured exercise class, Type of exercise: treadmill;walking;strength training/weights, Time (Minutes): > 60, Frequency (Times/Week): 4, Weekly Exercise (Minutes/Week): 0, Intensity: Moderate, Exercise limited by: None identified  Goals    . DIET - INCREASE WATER INTAKE     Recommend drinking at least 6-8 glasses of water a day        Fall Risk Fall Risk  08/24/2017 01/25/2017 07/13/2016 03/05/2016 08/06/2015  Falls in the past year? No No Yes No No  Number falls in past yr: - - 1 - -  Comment - - slipped on ice - -  Injury with Fall? - - No - -  Risk for fall due to : - - - - -   Is the patient's home free of loose throw rugs in walkways, pet beds, electrical cords, etc?   yes      Grab bars in the bathroom? yes      Handrails on the stairs?   no stairs       Adequate lighting?   no  Timed Get Up and Go Performed: Completed in 8 seconds with no use of assistive devices, steady gait. No  intervention needed at this time.   Depression Screen PHQ 2/9 Scores 08/24/2017 01/25/2017 07/13/2016 03/05/2016  PHQ - 2 Score 0 0 0 0    Cognitive Function     6CIT Screen 08/24/2017 07/13/2016  What Year? 0 points 0 points  What month? 0 points 0 points  What time? 0 points 0 points  Count back from 20 0 points 0 points  Months in reverse 0 points 0 points  Repeat phrase 0 points 2 points  Total Score 0 2    Immunization History  Administered Date(s) Administered  . Pneumococcal Conjugate-13 07/13/2016  . Pneumococcal Polysaccharide-23 01/26/2011    Qualifies for Shingles Vaccine? Yes, discussed shingrix vaccine   Screening Tests Health Maintenance  Topic Date Due  . INFLUENZA VACCINE  09/30/2017  . Fecal DNA (Cologuard)  08/14/2019  . TETANUS/TDAP  03/02/2020  . PNA vac Low Risk Adult  Completed   Cancer Screenings: Lung: Low Dose CT Chest recommended if Age 36-80 years, 30 pack-year currently smoking OR have quit w/in 15years. Patient does not qualify. Colorectal: cologuard completed 08/12/2016   Additional Screenings: Hepatitis C Screening:not indicated       Plan:    I have personally reviewed and addressed the Medicare Annual Wellness questionnaire and have noted the following in the patient's chart:  A. Medical and social history B. Use of alcohol, tobacco or illicit drugs  C. Current medications and supplements D. Functional ability and status E.  Nutritional status F.  Physical activity G. Advance directives H. List of other physicians I.  Hospitalizations, surgeries, and ER visits in previous 12 months J.  [place] such as hearing and vision if needed, cognitive and depression L. Referrals and appointments   In addition, I have reviewed and discussed with patient certain preventive protocols, quality metrics, and best practice recommendations. A written personalized care  plan for preventive services as well as general preventive health  recommendations were provided to patient.   Signed,  Tyler Aas, LPN Nurse Health Advisor   Nurse Notes:none

## 2017-08-24 NOTE — Patient Instructions (Addendum)
[name] , Thank you for taking time to come for your Medicare Wellness Visit. I appreciate your ongoing commitment to your health goals. Please review the following plan we discussed and let me know if I can assist you in the future.   Screening recommendations/referrals: Colonoscopy: cologuard completed 08/12/2016 Recommended yearly ophthalmology/optometry visit for glaucoma screening and checkup Recommended yearly dental visit for hygiene and checkup  Vaccinations: Influenza vaccine: due 10/2017 Pneumococcal vaccine: up to date Tdap vaccine: up to date  Shingles vaccine: shingrix eligible, check with your insurance company for coverage   Advanced directives: Please bring a copy of your health care power of attorney and living will to the office at your convenience.  Conditions/risks identified: Recommend drinking at least 6-8 glasses of water a day   Next appointment:  Follow up for labs on 08/31/2017 at 8:30am at [facility].  Follow up on 09/03/2017 at 2:00pm with Dr.Karamalegos. Follow up in one year for your annual wellness exam.   Preventive Care 65 Years and Older, Male Preventive care refers to lifestyle choices and visits with your health care provider that can promote health and wellness. What does preventive care include?  A yearly physical exam. This is also called an annual well check.  Dental exams once or twice a year.  Routine eye exams. Ask your health care provider how often you should have your eyes checked.  Personal lifestyle choices, including:  Daily care of your teeth and gums.  Regular physical activity.  Eating a healthy diet.  Avoiding tobacco and drug use.  Limiting alcohol use.  Practicing safe sex.  Taking low doses of aspirin every day.  Taking vitamin and mineral supplements as recommended by your health care provider. What happens during an annual well check? The services and screenings done by your health care provider during your annual  well check will depend on your age, overall health, lifestyle risk factors, and family history of disease. Counseling  Your health care provider may ask you questions about your:  Alcohol use.  Tobacco use.  Drug use.  Emotional well-being.  Home and relationship well-being.  Sexual activity.  Eating habits.  History of falls.  Memory and ability to understand (cognition).  Work and work Statistician. Screening  You may have the following tests or measurements:  Height, weight, and BMI.  Blood pressure.  Lipid and cholesterol levels. These may be checked every 5 years, or more frequently if you are over 18 years old.  Skin check.  Lung cancer screening. You may have this screening every year starting at age 43 if you have a 30-pack-year history of smoking and currently smoke or have quit within the past 15 years.  Fecal occult blood test (FOBT) of the stool. You may have this test every year starting at age 50.  Flexible sigmoidoscopy or colonoscopy. You may have a sigmoidoscopy every 5 years or a colonoscopy every 10 years starting at age 44.  Prostate cancer screening. Recommendations will vary depending on your family history and other risks.  Hepatitis C blood test.  Hepatitis B blood test.  Sexually transmitted disease (STD) testing.  Diabetes screening. This is done by checking your blood sugar (glucose) after you have not eaten for a while (fasting). You may have this done every 1-3 years.  Abdominal aortic aneurysm (AAA) screening. You may need this if you are a current or former smoker.  Osteoporosis. You may be screened starting at age 64 if you are at high risk. Talk with  your health care provider about your test results, treatment options, and if necessary, the need for more tests. Vaccines  Your health care provider may recommend certain vaccines, such as:  Influenza vaccine. This is recommended every year.  Tetanus, diphtheria, and acellular  pertussis (Tdap, Td) vaccine. You may need a Td booster every 10 years.  Zoster vaccine. You may need this after age 54.  Pneumococcal 13-valent conjugate (PCV13) vaccine. One dose is recommended after age 7.  Pneumococcal polysaccharide (PPSV23) vaccine. One dose is recommended after age 34. Talk to your health care provider about which screenings and vaccines you need and how often you need them. This information is not intended to replace advice given to you by your health care provider. Make sure you discuss any questions you have with your health care provider. Document Released: 03/15/2015 Document Revised: 11/06/2015 Document Reviewed: 12/18/2014 Elsevier Interactive Patient Education  2017 [place] Prevention in the Home Falls can cause injuries. They can happen to people of all ages. There are many things you can do to make your home safe and to help prevent falls. What can I do on the outside of my home?  Regularly fix the edges of walkways and driveways and fix any cracks.  Remove anything that might make you trip as you walk through a door, such as a raised step or threshold.  Trim any bushes or trees on the path to your home.  Use bright outdoor lighting.  Clear any walking paths of anything that might make someone trip, such as rocks or tools.  Regularly check to see if handrails are loose or broken. Make sure that both sides of any steps have handrails.  Any raised decks and porches should have guardrails on the edges.  Have any leaves, snow, or ice cleared regularly.  Use sand or salt on walking paths during winter.  Clean up any spills in your garage right away. This includes oil or grease spills. What can I do in the bathroom?  Use night lights.  Install grab bars by the toilet and in the tub and shower. Do not use towel bars as grab bars.  Use non-skid mats or decals in the tub or shower.  If you need to sit down in the shower, use a plastic,  non-slip stool.  Keep the floor dry. Clean up any water that spills on the floor as soon as it happens.  Remove soap buildup in the tub or shower regularly.  Attach bath mats securely with double-sided non-slip rug tape.  Do not have throw rugs and other things on the floor that can make you trip. What can I do in the bedroom?  Use night lights.  Make sure that you have a light by your bed that is easy to reach.  Do not use any sheets or blankets that are too big for your bed. They should not hang down onto the floor.  Have a firm chair that has side arms. You can use this for support while you get dressed.  Do not have throw rugs and other things on the floor that can make you trip. What can I do in the kitchen?  Clean up any spills right away.  Avoid walking on wet floors.  Keep items that you use a lot in easy-to-reach places.  If you need to reach something above you, use a strong step stool that has a grab bar.  Keep electrical cords out of the way.  Do not  use floor polish or wax that makes floors slippery. If you must use wax, use non-skid floor wax.  Do not have throw rugs and other things on the floor that can make you trip. What can I do with my stairs?  Do not leave any items on the stairs.  Make sure that there are handrails on both sides of the stairs and use them. Fix handrails that are broken or loose. Make sure that handrails are as long as the stairways.  Check any carpeting to make sure that it is firmly attached to the stairs. Fix any carpet that is loose or worn.  Avoid having throw rugs at the top or bottom of the stairs. If you do have throw rugs, attach them to the floor with carpet tape.  Make sure that you have a light switch at the top of the stairs and the bottom of the stairs. If you do not have them, ask someone to add them for you. What else can I do to help prevent falls?  Wear shoes that:  Do not have high heels.  Have rubber  bottoms.  Are comfortable and fit you well.  Are closed at the toe. Do not wear sandals.  If you use a stepladder:  Make sure that it is fully opened. Do not climb a closed stepladder.  Make sure that both sides of the stepladder are locked into place.  Ask someone to hold it for you, if possible.  Clearly mark and make sure that you can see:  Any grab bars or handrails.  First and last steps.  Where the edge of each step is.  Use tools that help you move around (mobility aids) if they are needed. These include:  Canes.  Walkers.  Scooters.  Crutches.  Turn on the lights when you go into a dark area. Replace any light bulbs as soon as they burn out.  Set up your furniture so you have a clear path. Avoid moving your furniture around.  If any of your floors are uneven, fix them.  If there are any pets around you, be aware of where they are.  Review your medicines with your doctor. Some medicines can make you feel dizzy. This can increase your chance of falling. Ask your doctor what other things that you can do to help prevent falls. This information is not intended to replace advice given to you by your health care provider. Make sure you discuss any questions you have with your health care provider. Document Released: 12/13/2008 Document Revised: 07/25/2015 Document Reviewed: 03/23/2014 Elsevier Interactive Patient Education  2017 [employer].

## 2017-08-31 ENCOUNTER — Other Ambulatory Visit: Payer: Medicare Other

## 2017-08-31 DIAGNOSIS — N400 Enlarged prostate without lower urinary tract symptoms: Secondary | ICD-10-CM

## 2017-08-31 DIAGNOSIS — Z0000 Encounter for general adult medical examination without abnormal findings: Secondary | ICD-10-CM

## 2017-08-31 DIAGNOSIS — G20 Parkinson's disease: Secondary | ICD-10-CM

## 2017-08-31 DIAGNOSIS — E559 Vitamin D deficiency, unspecified: Secondary | ICD-10-CM

## 2017-08-31 DIAGNOSIS — F3342 Major depressive disorder, recurrent, in full remission: Secondary | ICD-10-CM

## 2017-08-31 DIAGNOSIS — R5382 Chronic fatigue, unspecified: Secondary | ICD-10-CM

## 2017-08-31 DIAGNOSIS — I6523 Occlusion and stenosis of bilateral carotid arteries: Secondary | ICD-10-CM

## 2017-08-31 DIAGNOSIS — Z79899 Other long term (current) drug therapy: Secondary | ICD-10-CM

## 2017-09-01 LAB — LIPID PANEL
CHOL/HDL RATIO: 2.6 (calc) (ref <5.0)
CHOLESTEROL: 131 mg/dL (ref <200)
HDL: 51 mg/dL (ref >40)
LDL Cholesterol (Calc): 66 mg/dL (calc)
NON-HDL CHOLESTEROL (CALC): 80 mg/dL (ref <130)
TRIGLYCERIDES: 67 mg/dL (ref <150)

## 2017-09-01 LAB — CBC WITH DIFFERENTIAL/PLATELET
BASOS PCT: 1 %
Basophils Absolute: 61 cells/uL (ref 0–200)
Eosinophils Absolute: 311 cells/uL (ref 15–500)
Eosinophils Relative: 5.1 %
HCT: 43.5 % (ref 38.5–50.0)
Hemoglobin: 14.8 g/dL (ref 13.2–17.1)
Lymphs Abs: 1385 cells/uL (ref 850–3900)
MCH: 29.8 pg (ref 27.0–33.0)
MCHC: 34 g/dL (ref 32.0–36.0)
MCV: 87.5 fL (ref 80.0–100.0)
MPV: 10.6 fL (ref 7.5–12.5)
Monocytes Relative: 6.1 %
NEUTROS ABS: 3971 {cells}/uL (ref 1500–7800)
Neutrophils Relative %: 65.1 %
PLATELETS: 160 10*3/uL (ref 140–400)
RBC: 4.97 10*6/uL (ref 4.20–5.80)
RDW: 12.6 % (ref 11.0–15.0)
TOTAL LYMPHOCYTE: 22.7 %
WBC: 6.1 10*3/uL (ref 3.8–10.8)
WBCMIX: 372 {cells}/uL (ref 200–950)

## 2017-09-01 LAB — HEMOGLOBIN A1C
Hgb A1c MFr Bld: 4.9 % of total Hgb (ref <5.7)
MEAN PLASMA GLUCOSE: 94 (calc)
eAG (mmol/L): 5.2 (calc)

## 2017-09-01 LAB — COMPLETE METABOLIC PANEL WITH GFR
AG RATIO: 1.7 (calc) (ref 1.0–2.5)
ALT: 5 U/L — AB (ref 9–46)
AST: 18 U/L (ref 10–35)
Albumin: 4.1 g/dL (ref 3.6–5.1)
Alkaline phosphatase (APISO): 60 U/L (ref 40–115)
BILIRUBIN TOTAL: 0.8 mg/dL (ref 0.2–1.2)
BUN: 18 mg/dL (ref 7–25)
CALCIUM: 9.2 mg/dL (ref 8.6–10.3)
CO2: 30 mmol/L (ref 20–32)
Chloride: 101 mmol/L (ref 98–110)
Creat: 0.91 mg/dL (ref 0.70–1.18)
GFR, Est African American: 95 mL/min/{1.73_m2} (ref >=60)
GFR, Est Non African American: 82 mL/min/{1.73_m2} (ref >=60)
GLOBULIN: 2.4 g/dL (ref 1.9–3.7)
Glucose, Bld: 90 mg/dL (ref 65–99)
Potassium: 4 mmol/L (ref 3.5–5.3)
SODIUM: 138 mmol/L (ref 135–146)
TOTAL PROTEIN: 6.5 g/dL (ref 6.1–8.1)

## 2017-09-01 LAB — TSH: TSH: 1.39 mIU/L (ref 0.40–4.50)

## 2017-09-01 LAB — PSA, TOTAL WITH REFLEX TO PSA, FREE: PSA, TOTAL: 2.1 ng/mL (ref <=4.0)

## 2017-09-01 LAB — VITAMIN D 25 HYDROXY (VIT D DEFICIENCY, FRACTURES): VIT D 25 HYDROXY: 42 ng/mL (ref 30–100)

## 2017-09-03 ENCOUNTER — Ambulatory Visit (INDEPENDENT_AMBULATORY_CARE_PROVIDER_SITE_OTHER): Payer: Medicare Other | Admitting: Family Medicine

## 2017-09-03 ENCOUNTER — Encounter: Payer: Self-pay | Admitting: Family Medicine

## 2017-09-03 VITALS — BP 101/56 | HR 55 | Temp 97.5°F | Resp 16 | Ht 69.0 in | Wt 162.0 lb

## 2017-09-03 DIAGNOSIS — Z0000 Encounter for general adult medical examination without abnormal findings: Secondary | ICD-10-CM | POA: Diagnosis not present

## 2017-09-03 DIAGNOSIS — F3342 Major depressive disorder, recurrent, in full remission: Secondary | ICD-10-CM

## 2017-09-03 DIAGNOSIS — R5382 Chronic fatigue, unspecified: Secondary | ICD-10-CM | POA: Diagnosis not present

## 2017-09-03 DIAGNOSIS — E559 Vitamin D deficiency, unspecified: Secondary | ICD-10-CM | POA: Diagnosis not present

## 2017-09-03 DIAGNOSIS — N400 Enlarged prostate without lower urinary tract symptoms: Secondary | ICD-10-CM

## 2017-09-03 DIAGNOSIS — K429 Umbilical hernia without obstruction or gangrene: Secondary | ICD-10-CM

## 2017-09-03 NOTE — Progress Notes (Signed)
Subjective:    Patient ID: Joseph Arroyo, male    DOB: 1941/01/02, 77 y.o.   MRN: [id number]  Joseph Arroyo is a 77 y.o. male presenting on 09/03/2017 for Annual Exam   HPI   Here for Annual Physical and Lab Review  Parkinson's Disease / Tremors Followed by Dr Joseph Arroyo ([place]), last seen 04/2017, prior diagnosis is for tremor-dominant PD, he has some baseline tremors reported at rest, he reports that he is hopeful for this to not be parkinsons and that it will eventually be resolved. He continues on current medication with carvidopa-levodopa and mirapex with improvement, last visit dose not changed.  Lifestyle He exercises regularly at gym 3 x weekly, does warm up stretching 10-30 min and mild exercise and increases Takes multiple daily supplements per current meds list  Anxiety / History of Mood or Depression He is currently followed by Dr Joseph Arroyo [facility]) He takes anti-depressant Paxil 60mg  most days (40mg  + 20mg  pill)  Fatigue Last visit reviewed background, labs unremarkable, with normal Vitamin D, TSH, CBC Clinically no other obvious symptoms at this time.  BPH without significant LUTS No new concerns. Last PSA screening recently PSA 2.1 - see below Request DRE today given concern for screening. He takes [employer] with improvement in LUTS, now has minimal symptoms   Health Maintenance:  Prostate CA Screening: Prior PSA / DRE reported normal. Last PSA 2.1 (08/31/17). Currently asymptomatic. No known family history of prostate CA. Due for screening.    Depression screen [facility] 2/9 09/03/2017 08/24/2017 01/25/2017  Decreased Interest 0 0 0  Down, Depressed, Hopeless 0 0 0  PHQ - 2 Score 0 0 0  Altered sleeping 0 - -  Tired, decreased energy 0 - -  Change in appetite 0 - -  Feeling bad or failure about yourself  0 - -  Trouble concentrating 0 - -  Moving slowly or fidgety/restless 0 - -    Suicidal thoughts 0 - -  PHQ-9 Score 0 - -  Difficult doing work/chores Not difficult at all - -    Past Medical History:  Diagnosis Date  . Allergic rhinitis due to pollen 11/21/2007  . Brachial neuritis or radiculitis NOS   . Cervicalgia   . Concussion    age 24 - s/p accident  . Costal chondritis   . Depression   . Essential and other specified forms of tremor   . GERD (gastroesophageal reflux disease)    in past  . Lumbago   . Occlusion and stenosis of carotid artery without mention of cerebral infarction   . Seizures [facility])    age 80 - after concussion   Past Surgical History:  Procedure Laterality Date  . CATARACT EXTRACTION Right 07/18/14  . CATARACT EXTRACTION W/PHACO Left 08/01/2014   Procedure: CATARACT EXTRACTION PHACO AND INTRAOCULAR LENS PLACEMENT (IOC);  Surgeon: Joseph Koyanagi, MD;  Location: [place];  Service: Ophthalmology;  Laterality: Left;  IVA TOPICAL  . COLONOSCOPY  2008  . OTHER SURGICAL HISTORY  2002   ear surgery  . STAPEDES SURGERY Right 2002   Social History   Socioeconomic History  . Marital status: Married    Spouse name: Joseph Arroyo  . Number of children: 2  . Years of education: 42  . Highest education level: Master's degree (e.g., MA, MS, MEng, MEd, MSW, MBA)  Occupational History  . 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 Financial resource strain: Not hard at all  . Food insecurity:    Worry: Never true    Inability: Never true  . Transportation needs:    Medical: No    Non-medical: No  Tobacco Use  . Smoking status: Never Smoker  . Smokeless tobacco: Never Used  Substance and Sexual Activity  . Alcohol use: No    Alcohol/week: 0.0 oz  . Drug use: No  . Sexual activity: Not Currently  Lifestyle  . Physical activity:    Days per week: 3 days    Minutes per session: 150+ min  . Stress: Not at all  Relationships  . Social connections:    Talks on phone: More than  three times a week    Gets together: More than three times a week    Attends religious service: More than 4 times per year    Active member of club or organization: Yes    Attends meetings of clubs or organizations: More than 4 times per year    Relationship status: Married  . Intimate partner violence:    Fear of current or ex partner: No    Emotionally abused: No    Physically abused: No    Forced sexual activity: No  Other Topics Concern  . Not on file  Social History Narrative   Married to Joseph Arroyo, has 2 children, has grandchildren   Right handed   Master's plus   He is born in [place], heritage is [place] (Father's side), [place] (Mother's side)      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    Family History  Problem Relation Age of Onset  . Heart failure Mother        enlarged heart   . Asthma Father    Current Outpatient Medications on File Prior to Visit  Medication Sig  . B Complex Vitamins (VITAMIN B COMPLEX PO) Take 1 tablet by mouth daily.  . Capsicum-Garlic 950-932 MG CAPS Take 200-300 mg by mouth.  . carbidopa-levodopa (SINEMET IR) 25-100 MG tablet Take 2 tablets by mouth 3 (three) times daily.  . fluticasone (FLONASE) 50 MCG/ACT nasal spray SPRAY 2 SPRAYS INTO EACH NOSTRIL EVERY DAY  . gabapentin (NEURONTIN) 100 MG capsule TAKE ONE CAPSULE BY MOUTH 2 TIMES A DAY  . Ginkgo Biloba (GNP GINGKO BILOBA EXTRACT PO) Take 120 mg by mouth.  Marland Kitchen HAWTHORNE BERRY PO Take by mouth daily.  Marland Kitchen ibuprofen (ADVIL,MOTRIN) 200 MG tablet Take 200 mg by mouth every 6 (six) hours as needed for moderate pain.  Marland Kitchen L-Methylfolate-Algae (DEPLIN 15 PO) Take 15 mg by mouth.   . Lutein 10 MG TABS Take 1 tablet by mouth 3 (three) times daily.  . Omega-3 1000 MG CAPS Take by mouth.  . omega-3 acid ethyl esters (LOVAZA) 1 g capsule Take 1 capsule by mouth daily.  Marland Kitchen PARoxetine (PAXIL) 20 MG tablet Take 20 mg by mouth daily.  Marland Kitchen PARoxetine (PAXIL) 40 MG tablet   . pramipexole (MIRAPEX) 0.125 MG tablet Take  0.25 mg by mouth 3 (three) times daily.  . Saw Palmetto, Serenoa repens, 1000 MG CAPS Take 2 capsules by mouth daily.   No current facility-administered medications on file prior to visit.     Review of Systems  Constitutional: Negative for activity change, appetite change, chills, diaphoresis, fatigue and fever.  HENT: Negative for congestion and hearing loss.   Eyes: Negative for visual disturbance.  Respiratory: Negative for apnea, cough, choking, chest tightness, shortness of breath and wheezing.  Cardiovascular: Negative for chest pain, palpitations and leg swelling.  Gastrointestinal: Negative for abdominal pain, constipation, diarrhea, nausea and vomiting.  Endocrine: Negative for cold intolerance.  Genitourinary: Negative for decreased urine volume, difficulty urinating, dysuria, frequency, hematuria, testicular pain and urgency.  Musculoskeletal: Negative for arthralgias, back pain and neck pain.  Skin: Negative for rash.  Allergic/Immunologic: Negative for environmental allergies.  Neurological: Negative for dizziness, weakness, light-headedness, numbness and headaches.  Hematological: Negative for adenopathy.  Psychiatric/Behavioral: Negative for behavioral problems, dysphoric mood and sleep disturbance. The patient is not nervous/anxious.    Per HPI unless specifically indicated above     Objective:    BP (!) 101/56   Pulse (!) 55   Temp (!) 97.5 F (36.4 C) (Oral)   Resp 16   Ht 5\' 9"  (1.753 m)   Wt 162 lb (73.5 kg)   BMI 23.92 kg/m   Wt Readings from Last 3 Encounters:  09/03/17 162 lb (73.5 kg)  08/24/17 161 lb 3.2 oz (73.1 kg)  08/11/17 161 lb (73 kg)    Physical Exam  Constitutional: He is oriented to person, place, and time. He appears well-developed and well-nourished. No distress.  Well-appearing, comfortable, cooperative  HENT:  Head: Normocephalic and atraumatic.  Mouth/Throat: Oropharynx is clear and moist.  Frontal / maxillary sinuses  non-tender. Nares patent without purulence or edema. Bilateral TMs clear without erythema, effusion or bulging. Oropharynx clear without erythema, exudates, edema or asymmetry.  Eyes: Pupils are equal, round, and reactive to light. Conjunctivae and EOM are normal. Right eye exhibits no discharge. Left eye exhibits no discharge.  Neck: Normal range of motion. Neck supple. No thyromegaly present.  Cardiovascular: Normal rate, regular rhythm, normal heart sounds and intact distal pulses.  No murmur heard. Pulmonary/Chest: Effort normal and breath sounds normal. No respiratory distress. He has no wheezes. He has no rales.  Abdominal: Soft. Bowel sounds are normal. He exhibits no distension and no mass. There is no tenderness.  Genitourinary:  Genitourinary Comments: Rectal/DRE: Normal external exam without hemorrhoids fissures or abnormality. DRE with palpation of mildly enlarged prostate smooth symmetrical without nodule or tenderness.  Musculoskeletal: Normal range of motion. He exhibits no edema or tenderness.  Upper / Lower Extremities: - Normal muscle tone, strength bilateral upper extremities 5/5, lower extremities 5/5  Lymphadenopathy:    He has no cervical adenopathy.  Neurological: He is alert and oriented to person, place, and time.  Distal sensation intact to light touch all extremities  Skin: Skin is warm and dry. No rash noted. He is not diaphoretic. No erythema.  Psychiatric: He has a normal mood and affect. His behavior is normal.  Well groomed, good eye contact, normal speech and thoughts  Nursing note and vitals reviewed.  Results for orders placed or performed in visit on 08/31/17  VITAMIN D 25 Hydroxy (Vit-D Deficiency, Fractures)  Result Value Ref Range   Vit D, 25-Hydroxy 42 30 - 100 ng/mL  TSH  Result Value Ref Range   TSH 1.39 0.40 - 4.50 mIU/L  PSA, Total with Reflex to PSA, Free  Result Value Ref Range   PSA, Total 2.1 < OR = 4.0 ng/mL  Lipid panel  Result Value  Ref Range   Cholesterol 131 <200 mg/dL   HDL 51 >40 mg/dL   Triglycerides 67 <150 mg/dL   LDL Cholesterol (Calc) 66 mg/dL (calc)   Total CHOL/HDL Ratio 2.6 <5.0 (calc)   Non-HDL Cholesterol (Calc) 80 <130 mg/dL (calc)  COMPLETE METABOLIC PANEL WITH GFR  Result Value Ref Range   Glucose, Bld 90 65 - 99 mg/dL   BUN 18 7 - 25 mg/dL   Creat 0.91 0.70 - 1.18 mg/dL   GFR, Est Non African American 82 > OR = 60 mL/min/1.66m2   GFR, Est African American 95 > OR = 60 mL/min/1.77m2   BUN/Creatinine Ratio NOT APPLICABLE 6 - 22 (calc)   Sodium 138 135 - 146 mmol/L   Potassium 4.0 3.5 - 5.3 mmol/L   Chloride 101 98 - 110 mmol/L   CO2 30 20 - 32 mmol/L   Calcium 9.2 8.6 - 10.3 mg/dL   Total Protein 6.5 6.1 - 8.1 g/dL   Albumin 4.1 3.6 - 5.1 g/dL   Globulin 2.4 1.9 - 3.7 g/dL (calc)   AG Ratio 1.7 1.0 - 2.5 (calc)   Total Bilirubin 0.8 0.2 - 1.2 mg/dL   Alkaline phosphatase (APISO) 60 40 - 115 U/L   AST 18 10 - 35 U/L   ALT 5 (L) 9 - 46 U/L  CBC with Differential/Platelet  Result Value Ref Range   WBC 6.1 3.8 - 10.8 Thousand/uL   RBC 4.97 4.20 - 5.80 Million/uL   Hemoglobin 14.8 13.2 - 17.1 g/dL   HCT 43.5 38.5 - 50.0 %   MCV 87.5 80.0 - 100.0 fL   MCH 29.8 27.0 - 33.0 pg   MCHC 34.0 32.0 - 36.0 g/dL   RDW 12.6 11.0 - 15.0 %   Platelets 160 140 - 400 Thousand/uL   MPV 10.6 7.5 - 12.5 fL   Neutro Abs 3,971 1,500 - 7,800 cells/uL   Lymphs Abs 1,385 850 - 3,900 cells/uL   WBC mixed population 372 200 - 950 cells/uL   Eosinophils Absolute 311 15 - 500 cells/uL   Basophils Absolute 61 0 - 200 cells/uL   Neutrophils Relative % 65.1 %   Total Lymphocyte 22.7 %   Monocytes Relative 6.1 %   Eosinophils Relative 5.1 %   Basophils Relative 1.0 %  Hemoglobin A1c  Result Value Ref Range   Hgb A1c MFr Bld 4.9 <5.7 % of total Hgb   Mean Plasma Glucose 94 (calc)   eAG (mmol/L) 5.2 (calc)      Assessment & Plan:   Problem List Items Addressed This Visit    BPH without obstruction/lower  urinary tract symptoms    Stable chronic BPH without significant symptoms on Saw Palmetto - Last PSA 2.1 (08/2017) - Last DRE 09/03/17 - mild enlarged smooth - No known personal/family history of prostate CA  Plan: Continue supplement Saw Palmetto Follow-up if worsening      Chronic fatigue    Uncertain exact etiology Prior labs unremarkable Suspect may be age and lifestyle related Follow-up clinical course in future      Major depression, recurrent, full remission ([place])    Stable see last A&P Followed by Psychiatry Continue SSRI Paxil therapy      Umbilical hernia    Asymptomatic follow-up if worsening, review return precautions      Vitamin D insufficiency    Stable last lab Vitamin D Continue OTC Vitamin D3 supplement in future       Other Visit Diagnoses    Annual physical exam    -  Primary    Updated Health Maintenance information Reviewed recent lab results with patient - Controlled cholesterol labs - discussed future ASCVD risk, however given his good health and normal lipids, after reviewing his risk score he understands this but declines to take Statin therapy,  he has lower baseline cardiovascular risk than average population his age. Encouraged improvement to lifestyle with diet and exercise   No orders of the defined types were placed in this encounter.   Follow up plan: Return in about 1 year (around 09/04/2018) for Annual Physical.  Nobie Putnam, DO [facility] 09/04/2017, 9:14 PM

## 2017-09-03 NOTE — Patient Instructions (Addendum)
Thank you for coming to the office today.  We considered Statin medicine to reduce future risk of heart attack or stroke - your risk calculation is about 15% in next 10 years - this is better or lower risk compared to average population at age 76. We agreed to not proceed with Statin at this time, may reconsider in future  Vitamin D is normal range, currently - continue what you are doing at this time, both sun exposure and vitamin d supplement. - If you need more Vitamin D - can take Vitamin D3 500 up to 2,000 units a day for maintenance dose  Prostate mildly enlarged on exam - we can check this as needed in future - will check PSA yearly  DUE for FASTING BLOOD WORK (no food or drink after midnight before the lab appointment, only water or coffee without cream/sugar on the morning of)  SCHEDULE "Lab Only" visit in the morning at the clinic for lab draw in 1 YEAR  - Make sure Lab Only appointment is at about 1 week before your next appointment, so that results will be available  For Lab Results, once available within 2-3 days of blood draw, you can can log in to MyChart online to view your results and a brief explanation. Also, we can discuss results at next follow-up visit.  Please schedule a Follow-up Appointment to: Return in about 1 year (around 09/04/2018) for Annual Physical.  If you have any other questions or concerns, please feel free to call the office or send a message through [facility]. You may also schedule an earlier appointment if necessary.  Additionally, you may be receiving a survey about your experience at our office within a few days to 1 week by e-mail or mail. We value your feedback.  Nobie Putnam, DO [facility]

## 2017-09-04 ENCOUNTER — Encounter: Payer: Self-pay | Admitting: Family Medicine

## 2017-09-04 DIAGNOSIS — K429 Umbilical hernia without obstruction or gangrene: Secondary | ICD-10-CM | POA: Insufficient documentation

## 2017-09-04 NOTE — Assessment & Plan Note (Addendum)
Stable chronic BPH without significant symptoms on Saw Palmetto - Last PSA 2.1 (08/2017) - Last DRE 09/03/17 - mild enlarged smooth - No known personal/family history of prostate CA  Plan: Continue supplement Saw Palmetto Follow-up if worsening

## 2017-09-04 NOTE — Assessment & Plan Note (Signed)
Uncertain exact etiology Prior labs unremarkable Suspect may be age and lifestyle related Follow-up clinical course in future

## 2017-09-04 NOTE — Assessment & Plan Note (Signed)
Stable see last A&P Followed by Psychiatry Continue SSRI Paxil therapy 

## 2017-09-04 NOTE — Assessment & Plan Note (Signed)
Asymptomatic follow-up if worsening, review return precautions

## 2017-09-04 NOTE — Assessment & Plan Note (Signed)
Stable last lab Vitamin D Continue OTC Vitamin D3 supplement in future

## 2017-10-28 ENCOUNTER — Ambulatory Visit: Payer: Medicare Other | Admitting: Physical Therapy

## 2017-10-28 ENCOUNTER — Ambulatory Visit: Payer: Medicare Other | Attending: Neurology

## 2017-10-28 ENCOUNTER — Ambulatory Visit: Payer: Medicare Other | Admitting: Occupational Therapy

## 2017-10-28 DIAGNOSIS — R2689 Other abnormalities of gait and mobility: Secondary | ICD-10-CM | POA: Insufficient documentation

## 2017-10-28 DIAGNOSIS — R29818 Other symptoms and signs involving the nervous system: Secondary | ICD-10-CM

## 2017-10-28 DIAGNOSIS — R471 Dysarthria and anarthria: Secondary | ICD-10-CM | POA: Insufficient documentation

## 2017-10-28 DIAGNOSIS — R1312 Dysphagia, oropharyngeal phase: Secondary | ICD-10-CM | POA: Insufficient documentation

## 2017-10-28 NOTE — Therapy (Signed)
[facility] [address] [place], [place], [address] Phone: [phone]   Fax:  [phone]  Patient Details  Name: Joseph Arroyo MRN: [id number] Date of Birth: [birth year] Referring Provider:  Roselee Nova, MD  Encounter Date: 10/28/2017  Occupational Therapy Parkinson's Disease Screen  Hand dominance:  right   Fastening/unfastening 3 buttons in:  25.35sec  9-hole peg test:    RUE  26.28 sec        LUE  26.31 sec  Box & Blocks Test:   RUE  55 blocks        LUE  56 blocks  Change in ability to perform ADLs/IADLs:  No   Other Comments:  Leans to the right.    Pt does not require occupational therapy services at this time.  Recommended occupational therapy screen in   approx 6-47months.  Minimal decr in RUE coordination assessments from last screen per screens--will monitor closely at next screen.    [facility] 10/28/2017, 10:39 AM  [facility] [address] [place], [place], [address] Phone: [phone]   Fax:  [place], OTR/L [facility] [address] [place], [place]  [address] [phone] phone [phone] 10/28/17 12:51 PM

## 2017-10-28 NOTE — Therapy (Signed)
[facility] [address] [place], [place], [address] Phone: [phone]   Fax:  [phone]  Patient Details  Name: Joseph Arroyo MRN: [id number] Date of Birth: [birth year] Referring Provider:  Roselee Nova, MD  Encounter Date: 10/28/2017  Physical Therapy Parkinson's Disease Screen   Timed Up and Go test: 7.82 sec  10 meter walk test: 7.32 sec = 4.48 ft/sec  5 time sit to stand test: 9.22 sec    Patient does not require Physical Therapy services at this time.  Recommend Physical Therapy screen in 6-9 months.     Joseph Arroyo W. 10/28/2017, 10:55 AM  Frazier Butt., PT   [facility] [address] [place], [place], [address] Phone: [phone]   Fax:  [phone]

## 2017-10-28 NOTE — Therapy (Signed)
[facility] [address] [place], [place], [address] Phone: [phone]   Fax:  [phone]  Patient Details  Name: Joseph Arroyo MRN: [id number] Date of Birth: 03-Mar-1940 Referring Provider:  Roselee Nova, MD  Encounter Date: 10/28/2017  Speech Therapy Parkinson's Disease Screen   Decibel Level today: low 70s dB  (WNL=70-72 dB) with sound level meter 30cm away from pt's mouth. Pt's conversational volume has remained the same since last treatment course.  Pt does not report difficulty in swallowing warranting objective evaluation, except for when eating nuts, rarely. "If I put too many in my mouth, I sometimes have difficulty." SLP cautioned pt to exercise more caution.  Pt does does not require speech therapy services at this time. Recommend ST screen in another approx 6 months.   [facility] ,[place], [place]  10/28/2017, 2:37 PM  [facility] [address] [place], [place], [address] Phone: [phone]   Fax:  [phone]

## 2018-01-05 ENCOUNTER — Encounter: Payer: Self-pay | Admitting: Family Medicine

## 2018-01-05 ENCOUNTER — Ambulatory Visit: Payer: Medicare Other | Admitting: Family Medicine

## 2018-01-05 VITALS — BP 96/61 | HR 61 | Resp 16 | Ht 69.0 in | Wt 160.0 lb

## 2018-01-05 DIAGNOSIS — M23322 Other meniscus derangements, posterior horn of medial meniscus, left knee: Secondary | ICD-10-CM | POA: Diagnosis not present

## 2018-01-05 DIAGNOSIS — R197 Diarrhea, unspecified: Secondary | ICD-10-CM

## 2018-01-05 NOTE — Progress Notes (Signed)
Subjective:    Patient ID: Joseph Arroyo, male    DOB: Nov 16, 1940, 77 y.o.   MRN: [id number]  Joseph Arroyo is a 77 y.o. male presenting on 01/05/2018 for Diarrhea (onset couple of month Imodium was taken has Hx of Hpylori ) and Knee Pain (left side onset 9 month little worst couple of months)   HPI   Recurrent Diarrhea Reports symptoms for episodes of diarrhea, without other concerning symptoms - He eats healthy. He does not drink enough water by his report. - He has taken Imodium OTC as needed for diarrhea in past. He takes 2-4 tabs per dose and intermittently with some results - History of H Pylori - rarely  - He does not have history of Celiac disease, but he avoids grains. He tries to eat mostly gluten free. - Admits low energy - Denies any fevers, chills, abdominal pain, dark stool, blood per stool  Chronic Pain Left Knee, torn meniscus Last seen for this problem by me 07/2017, provided updates, without worsening, was not ready to return to [facility] Dr Marry [facility] - Today now he reports same concerns He still works out at gym, does hamstring strength, not doing leg extensions - he is doing some exercises without leg extension movements of knee, but he is still firing muscles to help rehab his leg muscles - He will follow back up with Dr Marry Guan Ortho - and consider L knee arthroscopic procedure - Had LBP episodes, last X-ray 04/2012 Lumbar / Thoracic - Last imaging MRI 04/2017 showed suspected partial tear in multiple locations  Health Maintenance: Due for Flu Shot, declines today despite counseling on benefits   Depression screen [facility] 2/9 01/05/2018 09/03/2017 08/24/2017  Decreased Interest 0 0 0  Down, Depressed, Hopeless 0 0 0  PHQ - 2 Score 0 0 0  Altered sleeping - 0 -  Tired, decreased energy - 0 -  Change in appetite - 0 -  Feeling bad or failure about yourself  - 0 -  Trouble concentrating - 0 -  Moving slowly or fidgety/restless - 0 -  Suicidal  thoughts - 0 -  PHQ-9 Score - 0 -  Difficult doing work/chores - Not difficult at all -    Social History   Tobacco Use  . Smoking status: Never Smoker  . Smokeless tobacco: Never Used  Substance Use Topics  . Alcohol use: No    Alcohol/week: 0.0 standard drinks  . Drug use: No    Review of Systems Per HPI unless specifically indicated above     Objective:    BP 96/61   Pulse 61   Resp 16   Ht 5\' 9"  (1.753 m)   Wt 160 lb (72.6 kg)   BMI 23.63 kg/m   Wt Readings from Last 3 Encounters:  01/05/18 160 lb (72.6 kg)  09/03/17 162 lb (73.5 kg)  08/24/17 161 lb 3.2 oz (73.1 kg)    Physical Exam  Constitutional: He is oriented to person, place, and time. He appears well-developed and well-nourished. No distress.  Well-appearing, comfortable, cooperative  HENT:  Head: Normocephalic and atraumatic.  Mouth/Throat: Oropharynx is clear and moist.  Eyes: Conjunctivae are normal. Right eye exhibits no discharge. Left eye exhibits no discharge.  Cardiovascular: Normal rate.  Pulmonary/Chest: Effort normal.  Abdominal: Soft. He exhibits no distension and no mass. There is no tenderness. There is no rebound and no guarding.  Slightly hyperactive bowel sound  Musculoskeletal: He exhibits no edema.  Neurological: He is alert  and oriented to person, place, and time.  Skin: Skin is warm and dry. No rash noted. He is not diaphoretic. No erythema.  Psychiatric: He has a normal mood and affect. His behavior is normal.  Well groomed, good eye contact, normal speech and thoughts  Nursing note and vitals reviewed.  Results for orders placed or performed in visit on 01/05/18  [id number]  Result Value Ref Range   QUESTION/PROBLEM:     SPECIMEN(S) RECEIVED: RS   UNLABELED  Result Value Ref Range   Message     Specimen Type RS    Name on Specimen NO NAME ON RS    Test Ordered On Req [id number],[id number]       Assessment & Plan:   Problem List Items Addressed This Visit    Derangement of medial  meniscus, posterior horn, left    Stable without worsening, able to function and ambulate with modified activity Without new injury or problem  Discussed goal to return to [facility] Dr Marry Guan when ready for further intervention       Other Visit Diagnoses    Diarrhea, unspecified type    -  Primary   Relevant Orders   Stool culture   Ova and parasite examination   C. difficile GDH and Toxin A/B   Watery diarrhea       Relevant Orders   Stool culture   Ova and parasite examination   C. difficile GDH and Toxin A/B      Clinically uncomplicated watery diarrhea, uncertain etiology, most likely functional based on history However given reported persistent watery nature of diarrhea, will check panel for infectious etiology including C Diff  Plan Check Stool culture, O&P, C Diff panel Diet modification Try peppermint oil May continue imodium PRN Maintain hydration Follow-up if not improving  No orders of the defined types were placed in this encounter.   Follow up plan: Return in about 3 months (around 04/07/2018), or if symptoms worsen or fail to improve, for Diarrhea.   Nobie Putnam, [facility] 01/05/2018, 9:19 AM

## 2018-01-05 NOTE — Patient Instructions (Addendum)
Thank you for coming to the office today.  Stool testing today for diarrhea - follow-up based on results will discuss.  OTC Peppermint Oil (Triple Coated Capsule) 180mg  take one 3 times daily to reduce diarrhea  Continue Imodium OTC as needed  Try to increase fiber in diet for bulking up stool.  May take Probiotic as well.  Follow back up with Dr Marry Guan for knee.  If not improving consider referral to GI specialist for 2nd opinion.   Please schedule a Follow-up Appointment to: Return in about 3 months (around 04/07/2018), or if symptoms worsen or fail to improve, for Diarrhea.  If you have any other questions or concerns, please feel free to call the office or send a message through [facility]. You may also schedule an earlier appointment if necessary.  Additionally, you may be receiving a survey about your experience at our office within a few days to 1 week by e-mail or mail. We value your feedback.  Nobie Putnam, DO [facility]

## 2018-01-06 NOTE — Assessment & Plan Note (Signed)
Stable without worsening, able to function and ambulate with modified activity Without new injury or problem  Discussed goal to return to [facility] when ready for further intervention

## 2018-01-07 LAB — UNLABELED: Test Ordered On Req: 91664

## 2018-01-07 LAB — TIQ-NTM

## 2018-01-11 LAB — STOOL CULTURE
MICRO NUMBER: 91336783
MICRO NUMBER:: 91336779
MICRO NUMBER:: 91336781
SHIGA RESULT: NOT DETECTED
SPECIMEN QUALITY: ADEQUATE
SPECIMEN QUALITY:: ADEQUATE
SPECIMEN QUALITY:: ADEQUATE

## 2018-01-11 LAB — OVA AND PARASITE EXAMINATION
CONCENTRATE RESULT: NONE SEEN
MICRO NUMBER:: 91336782
SPECIMEN QUALITY:: ADEQUATE
TRICHROME RESULT:: NONE SEEN

## 2018-01-11 LAB — PAT ID TIQ DOC

## 2018-01-11 LAB — C. DIFFICILE GDH AND TOXIN A/B
GDH ANTIGEN: NOT DETECTED
MICRO NUMBER: 91336780
SPECIMEN QUALITY: ADEQUATE
TOXIN A AND B: NOT DETECTED

## 2018-01-13 ENCOUNTER — Encounter: Payer: Self-pay | Admitting: Emergency Medicine

## 2018-01-13 DIAGNOSIS — F411 Generalized anxiety disorder: Secondary | ICD-10-CM

## 2018-01-13 DIAGNOSIS — F329 Major depressive disorder, single episode, unspecified: Secondary | ICD-10-CM | POA: Insufficient documentation

## 2018-01-31 ENCOUNTER — Ambulatory Visit: Payer: Medicare Other | Admitting: Psychiatry

## 2018-01-31 DIAGNOSIS — F411 Generalized anxiety disorder: Secondary | ICD-10-CM | POA: Diagnosis not present

## 2018-01-31 DIAGNOSIS — F3342 Major depressive disorder, recurrent, in full remission: Secondary | ICD-10-CM | POA: Diagnosis not present

## 2018-01-31 MED ORDER — PAROXETINE HCL 20 MG PO TABS: 20.0000 mg | ORAL_TABLET | Freq: Every day | ORAL | 1 refills | Status: DC

## 2018-01-31 MED ORDER — GABAPENTIN 100 MG PO CAPS: ORAL_CAPSULE | ORAL | 1 refills | Status: DC

## 2018-01-31 MED ORDER — PAROXETINE HCL 40 MG PO TABS: 40.0000 mg | ORAL_TABLET | Freq: Every day | ORAL | 1 refills | Status: DC

## 2018-01-31 NOTE — Progress Notes (Signed)
[facility]  Patient ID: Joseph Arroyo,  MRN: [id number]  PCP: Olin Hauser, DO  Date of Evaluation: 01/31/2018 Time spent:20 minutes  Chief Complaint:   HISTORY/CURRENT STATUS: HPI patient was doing well at his last visit on August 5 of this year.  His diagnoses include major depressive disorder and generalized anxiety disorder.  Kept him on Paxil 60 mg a day gabapentin 100 mg twice daily.  He is on Mirapex for Parkinson's symptoms.  He was encouraged to ask his family physician about doing a sleep study.   Individual Medical History/ Review of Systems: Changes? :No   Allergies: Prednisone and Wheat bran  Current Medications:  Current Outpatient Medications:  .  gabapentin (NEURONTIN) 100 MG capsule, TAKE ONE CAPSULE BY MOUTH 2 TIMES A DAY, Disp: 180 capsule, Rfl: 1 .  L-Methylfolate-Algae (DEPLIN 15 PO), Take 15 mg by mouth. , Disp: , Rfl:  .  PARoxetine (PAXIL) 20 MG tablet, Take 1 tablet (20 mg total) by mouth daily., Disp: 90 tablet, Rfl: 1 .  PARoxetine (PAXIL) 40 MG tablet, Take 1 tablet (40 mg total) by mouth daily., Disp: 90 tablet, Rfl: 1 .  B Complex Vitamins (VITAMIN B COMPLEX PO), Take 1 tablet by mouth daily., Disp: , Rfl:  .  Capsicum-Garlic 462-703 MG CAPS, Take 200-300 mg by mouth., Disp: , Rfl:  .  carbidopa-levodopa (SINEMET IR) 25-100 MG tablet, Take 2 tablets by mouth 3 (three) times daily., Disp: , Rfl:  .  fluticasone (FLONASE) 50 MCG/ACT nasal spray, SPRAY 2 SPRAYS INTO EACH NOSTRIL EVERY DAY, Disp: 16 g, Rfl: 8 .  Ginkgo Biloba (GNP GINGKO BILOBA EXTRACT PO), Take 120 mg by mouth., Disp: , Rfl:  .  HAWTHORNE BERRY PO, Take by mouth daily., Disp: , Rfl:  .  ibuprofen (ADVIL,MOTRIN) 200 MG tablet, Take 200 mg by mouth every 6 (six) hours as needed for moderate pain., Disp: , Rfl:  .  Lutein 10 MG TABS, Take 1 tablet by mouth 3 (three) times daily., Disp: , Rfl:  .  Omega-3 1000 MG CAPS, Take by mouth., Disp: , Rfl:  .  omega-3  acid ethyl esters (LOVAZA) 1 g capsule, Take 1 capsule by mouth daily., Disp: , Rfl:  .  pramipexole (MIRAPEX) 0.125 MG tablet, Take 0.25 mg by mouth 3 (three) times daily., Disp: , Rfl:  .  Saw Palmetto, Serenoa repens, 1000 MG CAPS, Take 2 capsules by mouth daily., Disp: , Rfl:  Medication Side Effects: none  Family Medical/ Social History: Changes? no  MENTAL HEALTH EXAM:  There were no vitals taken for this visit.There is no height or weight on file to calculate BMI.  General Appearance: Casual  Eye Contact:  Good  Speech:  Clear and Coherent  Volume:  Normal  Mood:  Euthymic  Affect:  Appropriate  Thought Process:  Linear  Orientation:  Full (Time, Place, and Person)  Thought Content: Logical   Suicidal Thoughts:  No  Homicidal Thoughts:  No  Memory:  WNL  Judgement:  Good  Insight:  Good  Psychomotor Activity:  Normal  Concentration:  Concentration: Good  Recall:  Good  Fund of Knowledge: Good  Language: Good  Assets:  Social Support faith  ADL's:  Intact  Cognition: WNL  Prognosis:  Good    DIAGNOSES:    ICD-10-CM   1. Major depression, recurrent, full remission ([name]) F33.42   2. GAD (generalized anxiety disorder) F41.1     Receiving Psychotherapy: No    RECOMMENDATIONS:  Mr. life  is doing well we will continue his same medicines Paxil 60 mg a day gabapentin 100 mg twice daily see him again in 6 months.   [name], PA-C

## 2018-06-28 ENCOUNTER — Ambulatory Visit: Payer: Medicare Other | Admitting: Occupational Therapy

## 2018-06-28 ENCOUNTER — Ambulatory Visit: Payer: Medicare Other | Admitting: Physical Therapy

## 2018-06-28 ENCOUNTER — Ambulatory Visit: Payer: Medicare Other

## 2018-08-01 ENCOUNTER — Ambulatory Visit: Payer: Medicare Other | Admitting: Psychiatry

## 2018-08-23 ENCOUNTER — Encounter: Payer: Self-pay | Admitting: Occupational Therapy

## 2018-08-23 ENCOUNTER — Ambulatory Visit: Payer: Medicare Other | Attending: Family Medicine

## 2018-08-23 ENCOUNTER — Encounter: Payer: Self-pay | Admitting: Physical Therapy

## 2018-08-23 ENCOUNTER — Other Ambulatory Visit: Payer: Self-pay

## 2018-08-23 DIAGNOSIS — R29818 Other symptoms and signs involving the nervous system: Secondary | ICD-10-CM

## 2018-08-23 DIAGNOSIS — R471 Dysarthria and anarthria: Secondary | ICD-10-CM | POA: Insufficient documentation

## 2018-08-23 DIAGNOSIS — R1312 Dysphagia, oropharyngeal phase: Secondary | ICD-10-CM | POA: Insufficient documentation

## 2018-08-23 NOTE — Therapy (Signed)
[facility] [address] [place], [place], [address] Phone: [phone]   Fax:  [phone]  Patient Details  Name: Joseph Arroyo MRN: [id number] Date of Birth: April 22, 1940 Referring Provider:  No ref. provider found  Encounter Date: 08/23/2018 Occupational Therapy Parkinson's Dis   Fastening/unfastening 3 buttons in:  44.43sec-  9-hole peg test:    RUE  22.21 sec        LUE  27.13 sec  Box & Blocks Test:   RUE  57 blocks        LUE  61 blocks    Change in ability to perform ADLs/IADLs:  no  Other Comments:  Handwriting is legibile and good letter size.    Pt does not require occupational therapy services at this time.  Recommended occupational therapy screen in   6 mons  Denman Pichardo 08/23/2018, 3:44 PM  [facility] [address] [place], [place], [address] Phone: [phone]   Fax:  [phone]

## 2018-08-23 NOTE — Therapy (Signed)
[facility] [address] [place], [place], [address] Phone: [phone]   Fax:  [phone]  Patient Details  Name: Joseph Arroyo MRN: [id number] Date of Birth: 13-Mar-1940 Referring Provider:  No ref. provider found  Encounter Date: 08/23/2018  Physical Therapy Parkinson's Disease Screen   Timed Up and Go test: 8.43 sec  10 meter walk test: 4.21 ft/sec  5 time sit to stand test: 9 sec    Patient does not require Physical Therapy services at this time.  Recommend Physical Therapy screen in 6-9 months.    Gabriellia Rempel W. 08/23/2018, 1:48 PM  Mady Haagensen, PT 08/23/18 1:50 PM Phone: [phone] Fax: [facility] [address] [place], [place], [address] Phone: [phone]   Fax:  [phone]

## 2018-08-23 NOTE — Therapy (Signed)
[facility] [address] [place], [place], [address] Phone: [phone]   Fax:  [phone]  Patient Details  Name: Joseph Arroyo MRN: [id number] Date of Birth: 1941/02/04 Referring Provider: Dr. Jennelle Human, Dr. Star Age  Encounter Date: 08/23/2018  Speech Therapy Parkinson's Disease Screen   Decibel Level today: sub-70dB  (WNL=70-72 dB) in 10 minutes of conversation with sound level meter 30cm away from pt's mouth. Pt's conversational volume has decreased since last screening; wife states pt is softer and more hoarse than last screening.   Pt has not experienced further difficulty in swallowing warranting objective evaluation - pt/wife continues to report pt coughing with nuts, occasionally. SLP has again told pt to reduce bite size.   Pt would benefit from speech-language eval for dysarthria- please order via [facility] or fax [phone] to schedule.    [facility] ,[place], [place]  08/23/2018, 1:19 PM  [facility] [address] [place], [place], [address] Phone: [phone]   Fax:  [phone]

## 2018-08-24 ENCOUNTER — Ambulatory Visit: Payer: Medicare Other | Admitting: Psychiatry

## 2018-08-24 ENCOUNTER — Encounter: Payer: Self-pay | Admitting: Psychiatry

## 2018-08-24 DIAGNOSIS — F3342 Major depressive disorder, recurrent, in full remission: Secondary | ICD-10-CM

## 2018-08-24 DIAGNOSIS — F411 Generalized anxiety disorder: Secondary | ICD-10-CM

## 2018-08-24 MED ORDER — PAROXETINE HCL 40 MG PO TABS: 40.0000 mg | ORAL_TABLET | Freq: Every day | ORAL | 1 refills | Status: DC

## 2018-08-24 MED ORDER — PAROXETINE HCL 20 MG PO TABS: 20.0000 mg | ORAL_TABLET | Freq: Every day | ORAL | 1 refills | Status: DC

## 2018-08-24 MED ORDER — DEPLIN 15 15-90.314 MG PO CAPS: 1.0000 | ORAL_CAPSULE | Freq: Every day | ORAL | 3 refills | Status: DC

## 2018-08-24 MED ORDER — GABAPENTIN 100 MG PO CAPS: ORAL_CAPSULE | ORAL | 1 refills | Status: DC
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
## 2018-09-01 ENCOUNTER — Other Ambulatory Visit: Payer: Self-pay | Admitting: Family Medicine

## 2018-09-01 ENCOUNTER — Other Ambulatory Visit: Payer: Medicare Other

## 2018-09-01 ENCOUNTER — Other Ambulatory Visit: Payer: Self-pay

## 2018-09-01 DIAGNOSIS — E559 Vitamin D deficiency, unspecified: Secondary | ICD-10-CM

## 2018-09-01 DIAGNOSIS — N400 Enlarged prostate without lower urinary tract symptoms: Secondary | ICD-10-CM

## 2018-09-01 DIAGNOSIS — G20 Parkinson's disease: Secondary | ICD-10-CM

## 2018-09-01 DIAGNOSIS — Z0000 Encounter for general adult medical examination without abnormal findings: Secondary | ICD-10-CM

## 2018-09-02 LAB — COMPLETE METABOLIC PANEL WITH GFR
AG Ratio: 1.8 (calc) (ref 1.0–2.5)
ALT: 37 U/L (ref 9–46)
AST: 26 U/L (ref 10–35)
Albumin: 4.2 g/dL (ref 3.6–5.1)
Alkaline phosphatase (APISO): 59 U/L (ref 35–144)
BUN: 19 mg/dL (ref 7–25)
CO2: 27 mmol/L (ref 20–32)
Calcium: 9 mg/dL (ref 8.6–10.3)
Chloride: 105 mmol/L (ref 98–110)
Creat: 0.74 mg/dL (ref 0.70–1.18)
GFR, Est African American: 104 mL/min/{1.73_m2} (ref >=60)
GFR, Est Non African American: 90 mL/min/{1.73_m2} (ref >=60)
Globulin: 2.4 g/dL (calc) (ref 1.9–3.7)
Glucose, Bld: 88 mg/dL (ref 65–99)
Potassium: 4 mmol/L (ref 3.5–5.3)
Sodium: 141 mmol/L (ref 135–146)
Total Bilirubin: 0.5 mg/dL (ref 0.2–1.2)
Total Protein: 6.6 g/dL (ref 6.1–8.1)

## 2018-09-02 LAB — CBC WITH DIFFERENTIAL/PLATELET
Absolute Monocytes: 292 cells/uL (ref 200–950)
Basophils Absolute: 72 cells/uL (ref 0–200)
Basophils Relative: 1.3 %
Eosinophils Absolute: 462 cells/uL (ref 15–500)
Eosinophils Relative: 8.4 %
HCT: 46.5 % (ref 38.5–50.0)
Hemoglobin: 15.4 g/dL (ref 13.2–17.1)
Lymphs Abs: 1315 cells/uL (ref 850–3900)
MCH: 30 pg (ref 27.0–33.0)
MCHC: 33.1 g/dL (ref 32.0–36.0)
MCV: 90.5 fL (ref 80.0–100.0)
MPV: 10 fL (ref 7.5–12.5)
Monocytes Relative: 5.3 %
Neutro Abs: 3361 cells/uL (ref 1500–7800)
Neutrophils Relative %: 61.1 %
Platelets: 170 10*3/uL (ref 140–400)
RBC: 5.14 10*6/uL (ref 4.20–5.80)
RDW: 12.5 % (ref 11.0–15.0)
Total Lymphocyte: 23.9 %
WBC: 5.5 10*3/uL (ref 3.8–10.8)

## 2018-09-02 LAB — TSH: TSH: 1.5 mIU/L (ref 0.40–4.50)

## 2018-09-02 LAB — VITAMIN D 25 HYDROXY (VIT D DEFICIENCY, FRACTURES): Vit D, 25-Hydroxy: 43 ng/mL (ref 30–100)

## 2018-09-02 LAB — PSA: PSA: 1.5 ng/mL (ref <=4.0)

## 2018-09-02 LAB — LIPID PANEL
Cholesterol: 127 mg/dL (ref <200)
HDL: 56 mg/dL (ref >=40)
LDL Cholesterol (Calc): 57 mg/dL (calc)
Non-HDL Cholesterol (Calc): 71 mg/dL (calc) (ref <130)
Total CHOL/HDL Ratio: 2.3 (calc) (ref <5.0)
Triglycerides: 60 mg/dL (ref <150)

## 2018-09-02 LAB — HEMOGLOBIN A1C
Hgb A1c MFr Bld: 5 % of total Hgb (ref <5.7)
Mean Plasma Glucose: 97 (calc)
eAG (mmol/L): 5.4 (calc)

## 2018-09-05 ENCOUNTER — Other Ambulatory Visit: Payer: Self-pay

## 2018-09-05 MED ORDER — DEPLIN 15 15-90.314 MG PO CAPS: 1.0000 | ORAL_CAPSULE | Freq: Every day | ORAL | 3 refills | Status: DC

## 2018-09-06 ENCOUNTER — Encounter: Payer: Self-pay | Admitting: Family Medicine

## 2018-09-06 ENCOUNTER — Other Ambulatory Visit: Payer: Self-pay

## 2018-09-06 ENCOUNTER — Ambulatory Visit (INDEPENDENT_AMBULATORY_CARE_PROVIDER_SITE_OTHER): Payer: Medicare Other

## 2018-09-06 ENCOUNTER — Other Ambulatory Visit: Payer: Self-pay | Admitting: Family Medicine

## 2018-09-06 ENCOUNTER — Ambulatory Visit (INDEPENDENT_AMBULATORY_CARE_PROVIDER_SITE_OTHER): Payer: Medicare Other | Admitting: Family Medicine

## 2018-09-06 VITALS — BP 104/69 | HR 51 | Temp 98.0°F | Resp 16 | Ht 69.0 in | Wt 154.0 lb

## 2018-09-06 VITALS — BP 104/69 | HR 52 | Temp 98.0°F | Resp 16 | Ht 69.0 in | Wt 154.0 lb

## 2018-09-06 DIAGNOSIS — Z0000 Encounter for general adult medical examination without abnormal findings: Secondary | ICD-10-CM

## 2018-09-06 DIAGNOSIS — L853 Xerosis cutis: Secondary | ICD-10-CM

## 2018-09-06 DIAGNOSIS — M545 Low back pain, unspecified: Secondary | ICD-10-CM

## 2018-09-06 DIAGNOSIS — F3342 Major depressive disorder, recurrent, in full remission: Secondary | ICD-10-CM

## 2018-09-06 DIAGNOSIS — M25562 Pain in left knee: Secondary | ICD-10-CM

## 2018-09-06 DIAGNOSIS — G8929 Other chronic pain: Secondary | ICD-10-CM

## 2018-09-06 DIAGNOSIS — N400 Enlarged prostate without lower urinary tract symptoms: Secondary | ICD-10-CM

## 2018-09-06 DIAGNOSIS — G20 Parkinson's disease: Secondary | ICD-10-CM

## 2018-09-06 DIAGNOSIS — E559 Vitamin D deficiency, unspecified: Secondary | ICD-10-CM

## 2018-09-06 MED ORDER — CLOTRIMAZOLE-BETAMETHASONE 1-0.05 % EX CREA: TOPICAL_CREAM | CUTANEOUS | 1 refills | Status: DC

## 2018-09-06 MED ORDER — L-METHYLFOLATE 15 MG PO TABS: 15.0000 mg | ORAL_TABLET | Freq: Every day | ORAL | 3 refills | Status: DC

## 2018-09-06 NOTE — Progress Notes (Signed)
Subjective:   Joseph Arroyo is a 78 y.o. male who presents for Medicare Annual/Subsequent preventive examination.  Review of Systems:   Cardiac Risk Factors include: advanced age (>44men, >35 women);male gender     Objective:    Vitals: BP 104/69 (BP Location: Left Arm, Patient Position: Sitting, Cuff Size: Normal)   Pulse (!) 51   Temp 98 F (36.7 C) (Oral)   Resp 16   Ht 5\' 9"  (1.753 m)   Wt 154 lb (69.9 kg)   BMI 22.74 kg/m   Body mass index is 22.74 kg/m.  Advanced Directives 09/06/2018 08/24/2017 07/24/2016 07/13/2016 03/05/2016 10/03/2015 09/19/2015  Does Patient Have a Medical Advance Directive? Yes Yes Yes Yes No Yes Yes  Type of Advance Directive Living will;Healthcare Power of [place];Living will [place];Living will [place];Living will - Living will;Healthcare Power of [place];Living will  Does patient want to make changes to medical advance directive? - - - - - No - Patient declined No - Patient declined  Copy of [place] in Chart? No - copy requested No - copy requested No - copy requested No - copy requested - No - copy requested No - copy requested    Tobacco Social History   Tobacco Use  Smoking Status Never Smoker  Smokeless Tobacco Never Used     Counseling given: Not Answered   Clinical Intake:  Pre-visit preparation completed: Yes  Pain : 0-10 Pain Score: 4  Pain Type: Chronic pain Pain Location: Back Pain Orientation: Lower Pain Onset: More than a month ago Pain Frequency: Intermittent Pain Relieving Factors: sitting, aleve  Pain Relieving Factors: sitting, aleve  Nutritional Status: BMI of 19-24  Normal Nutritional Risks: None Diabetes: No  How often do you need to have someone help you when you read instructions, pamphlets, or other written materials from your doctor or pharmacy?: 1 - Never What is the last grade  level you completed in school?: masters  Interpreter Needed?: No  Information entered by ::  ,LPN  Past Medical History:  Diagnosis Date  . Allergic rhinitis due to pollen 11/21/2007  . Brachial neuritis or radiculitis NOS   . Cervicalgia   . Concussion    age 46 - s/p accident  . Costal chondritis   . Depression   . Essential and other specified forms of tremor   . GERD (gastroesophageal reflux disease)    in past  . Lumbago   . Occlusion and stenosis of carotid artery without mention of cerebral infarction   . Seizures [facility])    age 3 - after concussion   Past Surgical History:  Procedure Laterality Date  . CATARACT EXTRACTION Right 07/18/14  . CATARACT EXTRACTION W/PHACO Left 08/01/2014   Procedure: CATARACT EXTRACTION PHACO AND INTRAOCULAR LENS PLACEMENT (IOC);  Surgeon: Leandrew Koyanagi, MD;  Location: [facility];  Service: Ophthalmology;  Laterality: Left;  IVA TOPICAL  . COLONOSCOPY  2008  . OTHER SURGICAL HISTORY  2002   ear surgery  . STAPEDES SURGERY Right 2002   Family History  Problem Relation Age of Onset  . Heart failure Mother        enlarged heart   . Asthma Father    Social History   Socioeconomic History  . Marital status: Married    Spouse name: Lyrick Lagrand  . Number of children: 2  . Years of education: 52  . Highest education level: Master's degree (  e.g., MA, MS, MEng, MEd, MSW, MBA)  Occupational History  . 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 Financial resource strain: Not hard at all  . Food insecurity    Worry: Never true    Inability: Never true  . Transportation needs    Medical: No    Non-medical: No  Tobacco Use  . Smoking status: Never Smoker  . Smokeless tobacco: Never Used  Substance and Sexual Activity  . Alcohol use: No    Alcohol/week: 0.0 standard drinks  . Drug use: No  . Sexual activity: Not Currently  Lifestyle  . Physical activity    Days per  week: 3 days    Minutes per session: 150+ min  . Stress: Not at all  Relationships  . Social connections    Talks on phone: More than three times a week    Gets together: More than three times a week    Attends religious service: More than 4 times per year    Active member of club or organization: Yes    Attends meetings of clubs or organizations: More than 4 times per year    Relationship status: Married  Other Topics Concern  . Not on file  Social History Narrative   Married to [name], has 2 children, has grandchildren   Right handed   Master's plus   He is born in [place], heritage is [place] (Father's side), [place] (Mother's side)      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     Outpatient Encounter Medications as of 09/06/2018  Medication Sig  . B Complex Vitamins (VITAMIN B COMPLEX PO) Take 1 tablet by mouth daily.  . Capsicum-Garlic 161-096 MG CAPS Take 200-300 mg by mouth.  . carbidopa-levodopa (SINEMET IR) 25-100 MG tablet Take 2 tablets by mouth 3 (three) times daily.  . Flaxseed Oil OIL by Does not apply route.  . fluticasone (FLONASE) 50 MCG/ACT nasal spray SPRAY 2 SPRAYS INTO EACH NOSTRIL EVERY DAY  . gabapentin (NEURONTIN) 100 MG capsule TAKE ONE CAPSULE BY MOUTH 2 TIMES A DAY  . Ginkgo Biloba (GNP GINGKO BILOBA EXTRACT PO) Take 120 mg by mouth.  Marland Kitchen HAWTHORNE BERRY PO Take by mouth daily.  Marland Kitchen ibuprofen (ADVIL,MOTRIN) 200 MG tablet Take 200 mg by mouth every 6 (six) hours as needed for moderate pain.  Marland Kitchen L-Methylfolate 15 MG TABS Take 1 tablet (15 mg total) by mouth daily.  . Lutein 10 MG TABS Take 1 tablet by mouth 3 (three) times daily.  Marland Kitchen omega-3 acid ethyl esters (LOVAZA) 1 g capsule Take 1 capsule by mouth daily.  Marland Kitchen PARoxetine (PAXIL) 20 MG tablet Take 1 tablet (20 mg total) by mouth daily. (Patient taking differently: Take 20 mg by mouth daily. Takes with 40 mg- 60mg  total)  . PARoxetine (PAXIL) 40 MG tablet Take 1 tablet (40 mg total) by mouth daily. (Patient taking  differently: Take 40 mg by mouth daily. Takes with 20mg - 60 mg total)  . pramipexole (MIRAPEX) 0.125 MG tablet Take 0.25 mg by mouth 3 (three) times daily.  . PSYLLIUM HUSK PO Take by mouth.  . Saw Palmetto, Serenoa repens, 1000 MG CAPS Take 2 capsules by mouth daily.  . [DISCONTINUED] Omega-3 1000 MG CAPS Take by mouth.   No facility-administered encounter medications on file as of 09/06/2018.     Activities of Daily Living In your present state of health, do you have any difficulty performing the following activities: 09/06/2018  Hearing?  N  Vision? N  Difficulty concentrating or making decisions? N  Walking or climbing stairs? N  Dressing or bathing? N  Doing errands, shopping? N  Preparing Food and eating ? N  Using the Toilet? N  In the past six months, have you accidently leaked urine? N  Do you have problems with loss of bowel control? N  Managing your Medications? N  Managing your Finances? N  Housekeeping or managing your Housekeeping? N  Some recent data might be hidden    Patient Care Team: Olin Hauser, DO as PCP - General (Family Medicine) Christene Lye, MD (General Surgery) Star Age, MD as Attending Physician (Neurology) [name], PA-C as Physician Assistant (Physician Assistant) Kem Parkinson, MD as Consulting Physician (Ophthalmology)   Assessment:   This is a routine wellness examination for [place].  Exercise Activities and Dietary recommendations Current Exercise Habits: Home exercise routine, Type of exercise: walking, Time (Minutes): 40, Frequency (Times/Week): 5, Weekly Exercise (Minutes/Week): 200, Intensity: Mild, Exercise limited by: None identified  Goals    . DIET - INCREASE WATER INTAKE     Recommend drinking at least 6-8 glasses of water a day     . Increase water intake     Recommend increasing water intake to 4-6 glasses of water a day.       Fall Risk: Fall Risk  09/06/2018 01/05/2018 09/03/2017 08/24/2017  01/25/2017  Falls in the past year? 0 0 No No No  Number falls in past yr: - - - - -  Comment - - - - -  Injury with Fall? - - - - -  Risk for fall due to : - - - - -    [place]:  Any stairs in or around the home? No  If so, are there any without handrails? n/a  Home free of loose throw rugs in walkways, pet beds, electrical cords, etc? Yes  Adequate lighting in your home to reduce risk of falls? Yes   ASSISTIVE DEVICES UTILIZED TO PREVENT FALLS:  Life alert? No  Use of a cane, walker or w/c? No  Grab bars in the bathroom? yes Shower chair or bench in shower? No  Elevated toilet seat or a handicapped toilet? No   TIMED UP AND GO:  Was the test performed? Yes .  Length of time to ambulate 10 feet: 10 sec.   GAIT:   Appearance of gait: Gait steady and fast without the use of an assistive device.  Education: Fall risk prevention has been discussed.  Intervention(s) required? No  DME/home health order needed?  No   Depression Screen PHQ 2/9 Scores 09/06/2018 01/05/2018 09/03/2017 08/24/2017  PHQ - 2 Score 0 0 0 0  PHQ- 9 Score - - 0 -    Cognitive Function     6CIT Screen 09/06/2018 08/24/2017 07/13/2016  What Year? 0 points 0 points 0 points  What month? 0 points 0 points 0 points  What time? 0 points 0 points 0 points  Count back from 20 0 points 0 points 0 points  Months in reverse 0 points 0 points 0 points  Repeat phrase 2 points 0 points 2 points  Total Score 2 0 2    Immunization History  Administered Date(s) Administered  . Pneumococcal Conjugate-13 07/13/2016  . Pneumococcal Polysaccharide-23 01/26/2011    Qualifies for Shingles Vaccine? No  Zostavax completed n/a. Due for Shingrix. Education has been provided regarding the importance of this vaccine.  Pt has been advised to call insurance company to determine out of pocket expense. Advised may also receive vaccine at local pharmacy or Health Dept. Verbalized acceptance and  understanding.  Tdap: up to date   Flu Vaccine: due 11/2018, declined   Pneumococcal Vaccine: up to date  Screening Tests Health Maintenance  Topic Date Due  . INFLUENZA VACCINE  10/01/2018  . TETANUS/TDAP  03/02/2020  . PNA vac Low Risk Adult  Completed   Cancer Screenings:  Colorectal Screening: no longer required  Lung Cancer Screening: (Low Dose CT Chest recommended if Age 71-80 years, 30 pack-year currently smoking OR have quit w/in 15years.) does not qualify.    Additional Screening:  Hepatitis C Screening: does not qualify  Vision Screening: Recommended annual ophthalmology exams for early detection of glaucoma and other disorders of the eye. Is the patient up to date with their annual eye exam?  Yes  Who is the provider or what is the name of the office in which the pt attends annual eye exams? [facility]    Dental Screening: Recommended annual dental exams for proper oral hygiene  Community Resource Referral:  CRR required this visit?  No        Plan:  I have personally reviewed and addressed the Medicare Annual Wellness questionnaire and have noted the following in the patient's chart:  A. Medical and social history B. Use of alcohol, tobacco or illicit drugs  C. Current medications and supplements D. Functional ability and status E.  Nutritional status F.  Physical activity G. Advance directives H. List of other physicians I.  Hospitalizations, surgeries, and ER visits in previous 12 months J.  [place] such as hearing and vision if needed, cognitive and depression L. Referrals and appointments   In addition, I have reviewed and discussed with patient certain preventive protocols, quality metrics, and best practice recommendations. A written personalized care plan for preventive services as well as general preventive health recommendations were provided to patient.   Signed,   Bevelyn Ngo, LPN  [id number]/7622 Nurse Health Advisor    Nurse Notes: none

## 2018-09-06 NOTE — Progress Notes (Signed)
Subjective:    Patient ID: Joseph Arroyo, male    DOB: 02-17-1941, 78 y.o.   MRN: [id number]  Joseph Arroyo is a 78 y.o. male presenting on 09/06/2018 for Annual Exam   HPI  Here for Annual Physical and Lab Review. Additionally patient has seen Tyler Aas LPN today for Annual Medicare Wellness  Parkinson's Disease / Tremors Followed by Dr Nyoka Cowden ([place]), last seen 08/2018, prior diagnosis is for tremor-dominant PD. He continues on current medication with carvidopa-levodopa and mirapex with improvement, last visit dose not changed.  Lifestyle He exercises regularly at gym 3 x weekly, does warm up stretching 10-30 min and mild exercise and increases Takes multiple daily supplements per current meds list  Major Depression recurrent full remission He is currently followed by Dr Lynder Parents [facility]) He takes anti-depressant Paxil 60mg  most days (40mg  + 20mg  pill)  Fatigue Last visit reviewed background, labs unremarkable, with normal Vitamin D, TSH, CBC Clinically no other obvious symptoms at this time.  BPH without significant LUTS No new concerns. Last PSA screening recently normal He takes [employer] with improvement in LUTS, now has minimal symptoms  Additional concerns  Right Low Back Pain - subacute on chronic problem, R sided low back. Affecting him, more due to stiffness and sore with arthritis less active due to coronavirus pandemic.  Left Knee Meniscus / pain Followed by Dr Marry Guan orthopedics, he had same issue discussed last year, they recommended wait before possible surgery, he is still walking but not able to do as much exercise - Requesting Letter to get back to gym, authorized to return - Still walking - Last imaging MRI 04/2017 showed suspected partial tear in multiple locations   Additional complaint Foot rash - itching, dermatology, topical, dry dermatitis  Health  Maintenance:  Prostate CA Screening: Prior PSA / DRE reported normal. Last PSA 1.5 (08/2018). Currently asymptomatic. No known family history of prostate CA.  Colon CA Screening - previous colonoscopy negative. Now last updated Cologuard 07/2016 negative.   Depression screen [facility] 2/9 09/06/2018 09/06/2018 01/05/2018  Decreased Interest 0 0 0  Down, Depressed, Hopeless 0 0 0  PHQ - 2 Score 0 0 0  Altered sleeping 0 - -  Tired, decreased energy 0 - -  Change in appetite 0 - -  Feeling bad or failure about yourself  0 - -  Trouble concentrating 0 - -  Moving slowly or fidgety/restless 0 - -  Suicidal thoughts 0 - -  PHQ-9 Score 0 - -  Difficult doing work/chores Not difficult at all - -    Past Medical History:  Diagnosis Date  . Allergic rhinitis due to pollen 11/21/2007  . Brachial neuritis or radiculitis NOS   . Cervicalgia   . Concussion    age 47 - s/p accident  . Costal chondritis   . Depression   . Essential and other specified forms of tremor   . GERD (gastroesophageal reflux disease)    in past  . Lumbago   . Occlusion and stenosis of carotid artery without mention of cerebral infarction   . Seizures [facility])    age 89 - after concussion   Past Surgical History:  Procedure Laterality Date  . CATARACT EXTRACTION Right 07/18/14  . CATARACT EXTRACTION W/PHACO Left 08/01/2014   Procedure: CATARACT EXTRACTION PHACO AND INTRAOCULAR LENS PLACEMENT (IOC);  Surgeon: Leandrew Koyanagi, MD;  Location: [place];  Service: Ophthalmology;  Laterality: Left;  IVA  TOPICAL  . COLONOSCOPY  2008  . OTHER SURGICAL HISTORY  2002   ear surgery  . STAPEDES SURGERY Right 2002   Social History   Socioeconomic History  . Marital status: Married    Spouse name: Maze Corniel  . Number of children: 2  . Years of education: 54  . Highest education level: Master's degree (e.g., MA, MS, MEng, MEd, MSW, MBA)  Occupational History  . 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 Financial resource strain: Not hard at all  . Food insecurity    Worry: Never true    Inability: Never true  . Transportation needs    Medical: No    Non-medical: No  Tobacco Use  . Smoking status: Never Smoker  . Smokeless tobacco: Never Used  Substance and Sexual Activity  . Alcohol use: No    Alcohol/week: 0.0 standard drinks  . Drug use: No  . Sexual activity: Not Currently  Lifestyle  . Physical activity    Days per week: 3 days    Minutes per session: 150+ min  . Stress: Not at all  Relationships  . Social connections    Talks on phone: More than three times a week    Gets together: More than three times a week    Attends religious service: More than 4 times per year    Active member of club or organization: Yes    Attends meetings of clubs or organizations: More than 4 times per year    Relationship status: Married  . Intimate partner violence    Fear of current or ex partner: No    Emotionally abused: No    Physically abused: No    Forced sexual activity: No  Other Topics Concern  . Not on file  Social History Narrative   Married to [name], has 2 children, has grandchildren   Right handed   Master's plus   He is born in [place], heritage is [place] (Father's side), [place] (Mother's side)      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    Family History  Problem Relation Age of Onset  . Heart failure Mother        enlarged heart   . Asthma Father    Current Outpatient Medications on File Prior to Visit  Medication Sig  . B Complex Vitamins (VITAMIN B COMPLEX PO) Take 1 tablet by mouth daily.  . Capsicum-Garlic 591-638 MG CAPS Take 200-300 mg by mouth.  . carbidopa-levodopa (SINEMET IR) 25-100 MG tablet Take 2 tablets by mouth 3 (three) times daily.  . Flaxseed Oil OIL by Does not apply route.  . fluticasone (FLONASE) 50 MCG/ACT nasal spray SPRAY 2 SPRAYS INTO EACH NOSTRIL EVERY DAY  . gabapentin (NEURONTIN) 100 MG capsule TAKE  ONE CAPSULE BY MOUTH 2 TIMES A DAY  . Ginkgo Biloba (GNP GINGKO BILOBA EXTRACT PO) Take 120 mg by mouth.  Marland Kitchen HAWTHORNE BERRY PO Take by mouth daily.  Marland Kitchen ibuprofen (ADVIL,MOTRIN) 200 MG tablet Take 200 mg by mouth every 6 (six) hours as needed for moderate pain.  . Lutein 10 MG TABS Take 1 tablet by mouth 3 (three) times daily.  Marland Kitchen omega-3 acid ethyl esters (LOVAZA) 1 g capsule Take 1 capsule by mouth daily.  Marland Kitchen PARoxetine (PAXIL) 20 MG tablet Take 1 tablet (20 mg total) by mouth daily. (Patient taking differently: Take 20 mg by mouth daily. Takes with 40 mg- 60mg  total)  . PARoxetine (PAXIL) 40  MG tablet Take 1 tablet (40 mg total) by mouth daily. (Patient taking differently: Take 40 mg by mouth daily. Takes with 20mg - 60 mg total)  . pramipexole (MIRAPEX) 0.125 MG tablet Take 0.25 mg by mouth 3 (three) times daily.  . PSYLLIUM HUSK PO Take by mouth.  . Saw Palmetto, Serenoa repens, 1000 MG CAPS Take 2 capsules by mouth daily.   No current facility-administered medications on file prior to visit.     Review of Systems  Constitutional: Negative for activity change, appetite change, chills, diaphoresis, fatigue and fever.  HENT: Negative for congestion and hearing loss.   Eyes: Negative for visual disturbance.  Respiratory: Negative for cough, chest tightness, shortness of breath and wheezing.   Cardiovascular: Negative for chest pain, palpitations and leg swelling.  Gastrointestinal: Negative for abdominal pain, constipation, diarrhea, nausea and vomiting.  Genitourinary: Negative for dysuria, frequency and hematuria.  Musculoskeletal: Negative for arthralgias and neck pain.  Skin: Negative for rash.  Neurological: Negative for dizziness, weakness, light-headedness, numbness and headaches.  Hematological: Negative for adenopathy.  Psychiatric/Behavioral: Negative for behavioral problems, dysphoric mood and sleep disturbance.   Per HPI unless specifically indicated above     Objective:     BP 104/69   Pulse (!) 52   Temp 98 F (36.7 C) (Oral)   Resp 16   Ht 5\' 9"  (1.753 m)   Wt 154 lb (69.9 kg)   SpO2 98%   BMI 22.74 kg/m   Wt Readings from Last 3 Encounters:  09/06/18 154 lb (69.9 kg)  09/06/18 154 lb (69.9 kg)  01/05/18 160 lb (72.6 kg)    Physical Exam Vitals signs and nursing note reviewed.  Constitutional:      General: He is not in acute distress.    Appearance: He is well-developed. He is not diaphoretic.     Comments: Well-appearing, comfortable, cooperative  HENT:     Head: Normocephalic and atraumatic.  Eyes:     General:        Right eye: No discharge.        Left eye: No discharge.     Conjunctiva/sclera: Conjunctivae normal.     Pupils: Pupils are equal, round, and reactive to light.  Neck:     Musculoskeletal: Normal range of motion and neck supple.     Thyroid: No thyromegaly.  Cardiovascular:     Rate and Rhythm: Normal rate and regular rhythm.     Heart sounds: Normal heart sounds. No murmur.  Pulmonary:     Effort: Pulmonary effort is normal. No respiratory distress.     Breath sounds: Normal breath sounds. No wheezing or rales.  Abdominal:     General: Bowel sounds are normal. There is no distension.     Palpations: Abdomen is soft. There is no mass.     Tenderness: There is no abdominal tenderness.  Musculoskeletal: Normal range of motion.        General: No tenderness.     Comments: Upper / Lower Extremities: - Normal muscle tone, strength bilateral upper extremities 5/5, lower extremities 5/5  Low Back Inspection: Normal appearance, no spinal deformity, symmetrical. Palpation: No tenderness over spinous processes. Mild R>L paraspinal muscle hypertonicity with some tender ROM: Full active ROM forward flex / back extension, rotation L/R without discomfort Special Testing: Seated SLR negative for radicular pain bilaterally  Strength: Bilateral hip flex/ext 5/5, knee flex/ext 5/5, ankle dorsiflex/plantarflex 5/5 Neurovascular:  intact distal sensation to light touch   Lymphadenopathy:  Cervical: No cervical adenopathy.  Skin:    General: Skin is warm and dry.     Findings: No erythema or rash.  Neurological:     Mental Status: He is alert and oriented to person, place, and time.     Comments: Distal sensation intact to light touch all extremities  Psychiatric:        Behavior: Behavior normal.     Comments: Well groomed, good eye contact, normal speech and thoughts    Results for orders placed or performed in visit on 09/01/18  TSH  Result Value Ref Range   TSH 1.50 0.40 - 4.50 mIU/L  VITAMIN D 25 Hydroxy (Vit-D Deficiency, Fractures)  Result Value Ref Range   Vit D, 25-Hydroxy 43 30 - 100 ng/mL  PSA  Result Value Ref Range   PSA 1.5 < OR = 4.0 ng/mL  Lipid panel  Result Value Ref Range   Cholesterol 127 <200 mg/dL   HDL 56 > OR = 40 mg/dL   Triglycerides 60 <150 mg/dL   LDL Cholesterol (Calc) 57 mg/dL (calc)   Total CHOL/HDL Ratio 2.3 <5.0 (calc)   Non-HDL Cholesterol (Calc) 71 <130 mg/dL (calc)  COMPLETE METABOLIC PANEL WITH GFR  Result Value Ref Range   Glucose, Bld 88 65 - 99 mg/dL   BUN 19 7 - 25 mg/dL   Creat 0.74 0.70 - 1.18 mg/dL   GFR, Est Non African American 90 > OR = 60 mL/min/1.76m2   GFR, Est African American 104 > OR = 60 mL/min/1.73m2   BUN/Creatinine Ratio NOT APPLICABLE 6 - 22 (calc)   Sodium 141 135 - 146 mmol/L   Potassium 4.0 3.5 - 5.3 mmol/L   Chloride 105 98 - 110 mmol/L   CO2 27 20 - 32 mmol/L   Calcium 9.0 8.6 - 10.3 mg/dL   Total Protein 6.6 6.1 - 8.1 g/dL   Albumin 4.2 3.6 - 5.1 g/dL   Globulin 2.4 1.9 - 3.7 g/dL (calc)   AG Ratio 1.8 1.0 - 2.5 (calc)   Total Bilirubin 0.5 0.2 - 1.2 mg/dL   Alkaline phosphatase (APISO) 59 35 - 144 U/L   AST 26 10 - 35 U/L   ALT 37 9 - 46 U/L  CBC with Differential/Platelet  Result Value Ref Range   WBC 5.5 3.8 - 10.8 Thousand/uL   RBC 5.14 4.20 - 5.80 Million/uL   Hemoglobin 15.4 13.2 - 17.1 g/dL   HCT 46.5 38.5 - 50.0  %   MCV 90.5 80.0 - 100.0 fL   MCH 30.0 27.0 - 33.0 pg   MCHC 33.1 32.0 - 36.0 g/dL   RDW 12.5 11.0 - 15.0 %   Platelets 170 140 - 400 Thousand/uL   MPV 10.0 7.5 - 12.5 fL   Neutro Abs 3,361 1,500 - 7,800 cells/uL   Lymphs Abs 1,315 850 - 3,900 cells/uL   Absolute Monocytes 292 200 - 950 cells/uL   Eosinophils Absolute 462 15 - 500 cells/uL   Basophils Absolute 72 0 - 200 cells/uL   Neutrophils Relative % 61.1 %   Total Lymphocyte 23.9 %   Monocytes Relative 5.3 %   Eosinophils Relative 8.4 %   Basophils Relative 1.3 %  Hemoglobin A1c  Result Value Ref Range   Hgb A1c MFr Bld 5.0 <5.7 % of total Hgb   Mean Plasma Glucose 97 (calc)   eAG (mmol/L) 5.4 (calc)      Assessment & Plan:   Problem List Items Addressed This Visit  Chronic pain of left knee    Stable chronic problem Followed by Ortho Deferring surgery for now by his request  Recommend resume gym rehab exercise, letter written      Major depression, recurrent, full remission ([place])    Stable see last A&P Followed by Psychiatry Continue SSRI Paxil therapy      Parkinson's disease ([place])    Stable, chronic problem w/ tremors primarily Followed by [facility] Dr Kalman Shan On carbidopa-levodopa, mirapex with improvement Continues to exercise and remain active Follow-up as planned       Other Visit Diagnoses    Annual physical exam    -  Primary   Chronic right-sided low back pain without sciatica     Subacute on chronic flare of arthritis Recommended conservative treatment options Tylenol, topical heat muscle rub PRN stretching back exercises    Dry skin dermatitis       Relevant Medications   clotrimazole-betamethasone (LOTRISONE) cream      Updated Health Maintenance information - PSA negative - Next cologuard due 2021 Reviewed recent lab results with patient Encouraged improvement to lifestyle with diet and exercise - Goal of weight loss   Meds ordered this encounter  Medications  .  clotrimazole-betamethasone (LOTRISONE) cream    Sig: Apply 1-2 times a day for worsening flare dry skin dermatitis of toes/feet, may re-use daily up to 1 week as needed.    Dispense:  30 g    Refill:  1    Follow up plan: Return in about 1 year (around 09/06/2019) for Annual Physical.  Future labs ordered 08/2019  Nobie Putnam, [facility] 09/06/2018, 10:26 AM

## 2018-09-06 NOTE — Assessment & Plan Note (Signed)
Stable, chronic problem w/ tremors primarily Followed by [facility] Dr Burton On carbidopa-levodopa, mirapex with improvement Continues to exercise and remain active Follow-up as planned 

## 2018-09-06 NOTE — Patient Instructions (Addendum)
Thank you for coming to the office today.  For the feet, can be a combination dry skin dermatitis and fungal infection - will try topical combination cream - If not better let me know can try an oral anti fungal also for toenails  Letter for knee to return to gym  1. Chemistry - Normal results, including electrolytes, kidney and liver function. Normal fasting blood sugar   2. Hemoglobin A1c (Diabetes screening) - 5.0, normal not in range of Pre-Diabetes (>5.7 to 6.4)   3. PSA Prostate Cancer Screening - 1.5, negative.   4. TSH Thyroid Function Tests - Normal.   5. Vitamin D 43, normal   6 Cholesterol - Normal cholesterol results.   7. CBC Blood Counts - Normal, no anemia, other abnormality   Recommend to start taking Tylenol Extra Strength 500mg  tabs - take 1 to 2 tabs per dose (max 1000mg ) every 6-8 hours for pain (take regularly, don't skip a dose for next 7 days), max 24 hour daily dose is 6 tablets or 3000mg . In the future you can repeat the same everyday Tylenol course for 1-2 weeks at a time.  - This is safe to take with anti-inflammatory medicines (Ibuprofen, Advil, Naproxen, Aleve, Meloxicam, Mobic) - If take Aleve can take 1-2 pills OTC with meal twice a day for 1 week then as needed  DUE for FASTING BLOOD WORK (no food or drink after midnight before the lab appointment, only water or coffee without cream/sugar on the morning of)  SCHEDULE "Lab Only" visit in the morning at the clinic for lab draw in 1 YEAR  - Make sure Lab Only appointment is at about 1 week before your next appointment, so that results will be available  For Lab Results, once available within 2-3 days of blood draw, you can can log in to MyChart online to view your results and a brief explanation. Also, we can discuss results at next follow-up visit.    Please schedule a Follow-up Appointment to: Return in about 1 year (around 09/06/2019) for Annual Physical.  If you have any other questions or concerns,  please feel free to call the office or send a message through [place]. You may also schedule an earlier appointment if necessary.  Additionally, you may be receiving a survey about your experience at our office within a few days to 1 week by e-mail or mail. We value your feedback.  Nobie Putnam, DO [facility], [facility]             Low Back Pain Exercises  See other page with pictures of each exercise.  Start with 1 or 2 of these exercises that you are most comfortable with. Do not do any exercises that cause you significant worsening pain. Some of these may cause some "stretching soreness" but it should go away after you stop the exercise, and get better over time. Gradually increase up to 3-4 exercises as tolerated.  Standing hamstring stretch: Place the heel of your leg on a stool about 15 inches high. Keep your knee straight. Lean forward, bending at the hips until you feel a mild stretch in the back of your thigh. Make sure you do not roll your shoulders and bend at the waist when doing this or you will stretch your lower back instead. Hold the stretch for 15 to 30 seconds. Repeat 3 times. Repeat the same stretch on your other leg.  Cat and camel: Get down on your hands and knees. Let your stomach  sag, allowing your back to curve downward. Hold this position for 5 seconds. Then arch your back and hold for 5 seconds. Do 3 sets of 10.  Quadriped Arm/Leg Raises: Get down on your hands and knees. Tighten your abdominal muscles to stiffen your spine. While keeping your abdominals tight, raise one arm and the opposite leg away from you. Hold this position for 5 seconds. Lower your arm and leg slowly and alternate sides. Do this 10 times on each side.  Pelvic tilt: Lie on your back with your knees bent and your feet flat on the floor. Tighten your abdominal muscles and push your lower back into the floor. Hold this position for 5 seconds, then relax. Do 3 sets of  10.  Partial curl: Lie on your back with your knees bent and your feet flat on the floor. Tighten your stomach muscles and flatten your back against the floor. Tuck your chin to your chest. With your hands stretched out in front of you, curl your upper body forward until your shoulders clear the floor. Hold this position for 3 seconds. Don't hold your breath. It helps to breathe out as you lift your shoulders up. Relax. Repeat 10 times. Build to 3 sets of 10. To challenge yourself, clasp your hands behind your head and keep your elbows out to the side.  Lower trunk rotation: Lie on your back with your knees bent and your feet flat on the floor. Tighten your abdominal muscles and push your lower back into the floor. Keeping your shoulders down flat, gently rotate your legs to one side, then the other as far as you can. Repeat 10 to 20 times.  Single knee to chest stretch: Lie on your back with your legs straight out in front of you. Bring one knee up to your chest and grasp the back of your thigh. Pull your knee toward your chest, stretching your buttock muscle. Hold this position for 15 to 30 seconds and return to the starting position. Repeat 3 times on each side.  Double knee to chest: Lie on your back with your knees bent and your feet flat on the floor. Tighten your abdominal muscles and push your lower back into the floor. Pull both knees up to your chest. Hold for 5 seconds and repeat 10 to 20 times.

## 2018-09-06 NOTE — Assessment & Plan Note (Signed)
Stable see last A&P Followed by Psychiatry Continue SSRI Paxil therapy

## 2018-09-06 NOTE — Patient Instructions (Signed)
Joseph Arroyo , Thank you for taking time to come for your Medicare Wellness Visit. I appreciate your ongoing commitment to your health goals. Please review the following plan we discussed and let me know if I can assist you in the future.   Screening recommendations/referrals: Colonoscopy: no longer required Recommended yearly ophthalmology/optometry visit for glaucoma screening and checkup Recommended yearly dental visit for hygiene and checkup  Vaccinations: Influenza vaccine: declined Pneumococcal vaccine: up to date Tdap vaccine: up to date Shingles vaccine: shingrix eligible, check with your insurance company for coverage     Advanced directives: Please bring a copy of your health care power of attorney and living will to the office at your convenience.  Conditions/risks identified: none   Next appointment: Follow up in one year for your annual wellness exam.   Preventive Care 65 Years and Older, Male Preventive care refers to lifestyle choices and visits with your health care provider that can promote health and wellness. What does preventive care include?  A yearly physical exam. This is also called an annual well check.  Dental exams once or twice a year.  Routine eye exams. Ask your health care provider how often you should have your eyes checked.  Personal lifestyle choices, including:  Daily care of your teeth and gums.  Regular physical activity.  Eating a healthy diet.  Avoiding tobacco and drug use.  Limiting alcohol use.  Practicing safe sex.  Taking low doses of aspirin every day.  Taking vitamin and mineral supplements as recommended by your health care provider. What happens during an annual well check? The services and screenings done by your health care provider during your annual well check will depend on your age, overall health, lifestyle risk factors, and family history of disease. Counseling  Your health care provider may ask you questions  about your:  Alcohol use.  Tobacco use.  Drug use.  Emotional well-being.  Home and relationship well-being.  Sexual activity.  Eating habits.  History of falls.  Memory and ability to understand (cognition).  Work and work Statistician. Screening  You may have the following tests or measurements:  Height, weight, and BMI.  Blood pressure.  Lipid and cholesterol levels. These may be checked every 5 years, or more frequently if you are over 16 years old.  Skin check.  Lung cancer screening. You may have this screening every year starting at age 57 if you have a 30-pack-year history of smoking and currently smoke or have quit within the past 15 years.  Fecal occult blood test (FOBT) of the stool. You may have this test every year starting at age 76.  Flexible sigmoidoscopy or colonoscopy. You may have a sigmoidoscopy every 5 years or a colonoscopy every 10 years starting at age 14.  Prostate cancer screening. Recommendations will vary depending on your family history and other risks.  Hepatitis C blood test.  Hepatitis B blood test.  Sexually transmitted disease (STD) testing.  Diabetes screening. This is done by checking your blood sugar (glucose) after you have not eaten for a while (fasting). You may have this done every 1-3 years.  Abdominal aortic aneurysm (AAA) screening. You may need this if you are a current or former smoker.  Osteoporosis. You may be screened starting at age 26 if you are at high risk. Talk with your health care provider about your test results, treatment options, and if necessary, the need for more tests. Vaccines  Your health care provider may recommend certain vaccines, such  as:  Influenza vaccine. This is recommended every year.  Tetanus, diphtheria, and acellular pertussis (Tdap, Td) vaccine. You may need a Td booster every 10 years.  Zoster vaccine. You may need this after age 34.  Pneumococcal 13-valent conjugate (PCV13)  vaccine. One dose is recommended after age 2.  Pneumococcal polysaccharide (PPSV23) vaccine. One dose is recommended after age 18. Talk to your health care provider about which screenings and vaccines you need and how often you need them. This information is not intended to replace advice given to you by your health care provider. Make sure you discuss any questions you have with your health care provider. Document Released: 03/15/2015 Document Revised: 11/06/2015 Document Reviewed: 12/18/2014 Elsevier Interactive Patient Education  2017 [place] Prevention in the Home Falls can cause injuries. They can happen to people of all ages. There are many things you can do to make your home safe and to help prevent falls. What can I do on the outside of my home?  Regularly fix the edges of walkways and driveways and fix any cracks.  Remove anything that might make you trip as you walk through a door, such as a raised step or threshold.  Trim any bushes or trees on the path to your home.  Use bright outdoor lighting.  Clear any walking paths of anything that might make someone trip, such as rocks or tools.  Regularly check to see if handrails are loose or broken. Make sure that both sides of any steps have handrails.  Any raised decks and porches should have guardrails on the edges.  Have any leaves, snow, or ice cleared regularly.  Use sand or salt on walking paths during winter.  Clean up any spills in your garage right away. This includes oil or grease spills. What can I do in the bathroom?  Use night lights.  Install grab bars by the toilet and in the tub and shower. Do not use towel bars as grab bars.  Use non-skid mats or decals in the tub or shower.  If you need to sit down in the shower, use a plastic, non-slip stool.  Keep the floor dry. Clean up any water that spills on the floor as soon as it happens.  Remove soap buildup in the tub or shower regularly.   Attach bath mats securely with double-sided non-slip rug tape.  Do not have throw rugs and other things on the floor that can make you trip. What can I do in the bedroom?  Use night lights.  Make sure that you have a light by your bed that is easy to reach.  Do not use any sheets or blankets that are too big for your bed. They should not hang down onto the floor.  Have a firm chair that has side arms. You can use this for support while you get dressed.  Do not have throw rugs and other things on the floor that can make you trip. What can I do in the kitchen?  Clean up any spills right away.  Avoid walking on wet floors.  Keep items that you use a lot in easy-to-reach places.  If you need to reach something above you, use a strong step stool that has a grab bar.  Keep electrical cords out of the way.  Do not use floor polish or wax that makes floors slippery. If you must use wax, use non-skid floor wax.  Do not have throw rugs and other things on the  floor that can make you trip. What can I do with my stairs?  Do not leave any items on the stairs.  Make sure that there are handrails on both sides of the stairs and use them. Fix handrails that are broken or loose. Make sure that handrails are as long as the stairways.  Check any carpeting to make sure that it is firmly attached to the stairs. Fix any carpet that is loose or worn.  Avoid having throw rugs at the top or bottom of the stairs. If you do have throw rugs, attach them to the floor with carpet tape.  Make sure that you have a light switch at the top of the stairs and the bottom of the stairs. If you do not have them, ask someone to add them for you. What else can I do to help prevent falls?  Wear shoes that:  Do not have high heels.  Have rubber bottoms.  Are comfortable and fit you well.  Are closed at the toe. Do not wear sandals.  If you use a stepladder:  Make sure that it is fully opened. Do not climb  a closed stepladder.  Make sure that both sides of the stepladder are locked into place.  Ask someone to hold it for you, if possible.  Clearly mark and make sure that you can see:  Any grab bars or handrails.  First and last steps.  Where the edge of each step is.  Use tools that help you move around (mobility aids) if they are needed. These include:  Canes.  Walkers.  Scooters.  Crutches.  Turn on the lights when you go into a dark area. Replace any light bulbs as soon as they burn out.  Set up your furniture so you have a clear path. Avoid moving your furniture around.  If any of your floors are uneven, fix them.  If there are any pets around you, be aware of where they are.  Review your medicines with your doctor. Some medicines can make you feel dizzy. This can increase your chance of falling. Ask your doctor what other things that you can do to help prevent falls. This information is not intended to replace advice given to you by your health care provider. Make sure you discuss any questions you have with your health care provider. Document Released: 12/13/2008 Document Revised: 07/25/2015 Document Reviewed: 03/23/2014 Elsevier Interactive Patient Education  2017 [employer].

## 2018-09-06 NOTE — Assessment & Plan Note (Signed)
Stable chronic problem Followed by Ortho Deferring surgery for now by his request  Recommend resume gym rehab exercise, letter written

## 2019-02-14 ENCOUNTER — Other Ambulatory Visit: Payer: Self-pay | Admitting: Psychiatry

## 2019-02-14 DIAGNOSIS — F411 Generalized anxiety disorder: Secondary | ICD-10-CM

## 2019-02-14 DIAGNOSIS — F3342 Major depressive disorder, recurrent, in full remission: Secondary | ICD-10-CM

## 2019-02-18 ENCOUNTER — Other Ambulatory Visit: Payer: Self-pay | Admitting: Psychiatry

## 2019-02-18 DIAGNOSIS — F411 Generalized anxiety disorder: Secondary | ICD-10-CM

## 2019-02-19 NOTE — Telephone Encounter (Signed)
Apt 12/21

## 2019-02-20 ENCOUNTER — Encounter: Payer: Self-pay | Admitting: Psychiatry

## 2019-02-20 ENCOUNTER — Other Ambulatory Visit: Payer: Self-pay

## 2019-02-20 ENCOUNTER — Ambulatory Visit (INDEPENDENT_AMBULATORY_CARE_PROVIDER_SITE_OTHER): Payer: Medicare Other | Admitting: Psychiatry

## 2019-02-20 DIAGNOSIS — F411 Generalized anxiety disorder: Secondary | ICD-10-CM

## 2019-02-20 DIAGNOSIS — F3342 Major depressive disorder, recurrent, in full remission: Secondary | ICD-10-CM | POA: Diagnosis not present
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
## 2019-03-07 ENCOUNTER — Ambulatory Visit (INDEPENDENT_AMBULATORY_CARE_PROVIDER_SITE_OTHER): Payer: Medicare PPO | Admitting: Family Medicine

## 2019-03-07 ENCOUNTER — Other Ambulatory Visit: Payer: Self-pay

## 2019-03-07 ENCOUNTER — Ambulatory Visit
Admission: RE | Admit: 2019-03-07 | Discharge: 2019-03-07 | Disposition: A | Discharge status: Home / Self Care | Payer: Medicare PPO | Attending: Family Medicine | Admitting: Family Medicine

## 2019-03-07 ENCOUNTER — Encounter: Payer: Self-pay | Admitting: Family Medicine

## 2019-03-07 ENCOUNTER — Ambulatory Visit
Admission: RE | Admit: 2019-03-07 | Discharge: 2019-03-07 | Disposition: A | Discharge status: Home / Self Care | Payer: Medicare PPO | Source: Ambulatory Visit | Attending: Family Medicine | Admitting: Family Medicine

## 2019-03-07 VITALS — BP 113/64 | HR 50 | Temp 98.4°F | Resp 16 | Ht 69.0 in | Wt 158.0 lb

## 2019-03-07 DIAGNOSIS — W19XXXA Unspecified fall, initial encounter: Secondary | ICD-10-CM | POA: Diagnosis not present

## 2019-03-07 DIAGNOSIS — G8929 Other chronic pain: Secondary | ICD-10-CM

## 2019-03-07 DIAGNOSIS — M545 Low back pain, unspecified: Secondary | ICD-10-CM

## 2019-03-07 DIAGNOSIS — S5002XA Contusion of left elbow, initial encounter: Secondary | ICD-10-CM | POA: Diagnosis not present

## 2019-03-07 NOTE — Patient Instructions (Addendum)
Thank you for coming to the office today.  X-ray Lumbar Spine today,s tay tuned for results.  If we need we can refer you to EITHER Physical Therapy OR Chiropractor  Recommend to start taking Tylenol Extra Strength 500mg  tabs - take 1 to 2 tabs per dose (max 1000mg ) every 6-8 hours for pain (take regularly, don't skip a dose for next 7 days), max 24 hour daily dose is 6 tablets or 3000mg . In the future you can repeat the same everyday Tylenol course for 1-2 weeks at a time.  - This is safe to take with anti-inflammatory medicines (Ibuprofen, Advil, Naproxen, Aleve, Meloxicam, Mobic)  May take Aleve as needed 1-2 pills up to twice a day max  Let me know if need a muscle relaxant rx  Elbow is likely just contusion and bruise, but will heal, less likely to be broken.  Please schedule a Follow-up Appointment to: Return in about 6 weeks (around 04/18/2019), or if symptoms worsen or fail to improve, for back pain.  If you have any other questions or concerns, please feel free to call the office or send a message through [place]. You may also schedule an earlier appointment if necessary.  Additionally, you may be receiving a survey about your experience at our office within a few days to 1 week by e-mail or mail. We value your feedback.  Nobie Putnam, DO [facility], [facility]             Low Back Pain Exercises  See other page with pictures of each exercise.  Start with 1 or 2 of these exercises that you are most comfortable with. Do not do any exercises that cause you significant worsening pain. Some of these may cause some "stretching soreness" but it should go away after you stop the exercise, and get better over time. Gradually increase up to 3-4 exercises as tolerated.  Standing hamstring stretch: Place the heel of your leg on a stool about 15 inches high. Keep your knee straight. Lean forward, bending at the hips until you feel a mild stretch in the back of  your thigh. Make sure you do not roll your shoulders and bend at the waist when doing this or you will stretch your lower back instead. Hold the stretch for 15 to 30 seconds. Repeat 3 times. Repeat the same stretch on your other leg.  Cat and camel: Get down on your hands and knees. Let your stomach sag, allowing your back to curve downward. Hold this position for 5 seconds. Then arch your back and hold for 5 seconds. Do 3 sets of 10.  Quadriped Arm/Leg Raises: Get down on your hands and knees. Tighten your abdominal muscles to stiffen your spine. While keeping your abdominals tight, raise one arm and the opposite leg away from you. Hold this position for 5 seconds. Lower your arm and leg slowly and alternate sides. Do this 10 times on each side.  Pelvic tilt: Lie on your back with your knees bent and your feet flat on the floor. Tighten your abdominal muscles and push your lower back into the floor. Hold this position for 5 seconds, then relax. Do 3 sets of 10.  Partial curl: Lie on your back with your knees bent and your feet flat on the floor. Tighten your stomach muscles and flatten your back against the floor. Tuck your chin to your chest. With your hands stretched out in front of you, curl your upper body forward until your shoulders  clear the floor. Hold this position for 3 seconds. Don't hold your breath. It helps to breathe out as you lift your shoulders up. Relax. Repeat 10 times. Build to 3 sets of 10. To challenge yourself, clasp your hands behind your head and keep your elbows out to the side.  Lower trunk rotation: Lie on your back with your knees bent and your feet flat on the floor. Tighten your abdominal muscles and push your lower back into the floor. Keeping your shoulders down flat, gently rotate your legs to one side, then the other as far as you can. Repeat 10 to 20 times.  Single knee to chest stretch: Lie on your back with your legs straight out in front of you. Bring one knee up  to your chest and grasp the back of your thigh. Pull your knee toward your chest, stretching your buttock muscle. Hold this position for 15 to 30 seconds and return to the starting position. Repeat 3 times on each side.  Double knee to chest: Lie on your back with your knees bent and your feet flat on the floor. Tighten your abdominal muscles and push your lower back into the floor. Pull both knees up to your chest. Hold for 5 seconds and repeat 10 to 20 times.

## 2019-03-07 NOTE — Progress Notes (Signed)
Subjective:    Patient ID: Joseph Arroyo, male    DOB: 1941-01-26, 79 y.o.   MRN: [id number]  Joseph Arroyo is a 79 y.o. male presenting on 03/07/2019 for Fall (left elbow painful but not swollen, patient fell yesterday)   HPI   Fall / Left Elbow Pain / Low Back Pain / osteoarthritis multiple joints Reports accidental fall yesterday, he describes he did a makeshift step ladder by using a chair and he stood on it to reach top of cabinet, and he slipped off of chair, he did not have any other symptoms that triggered the fall, he said that he fell directly on his Left elbow and had some pain and swelling. - Additionally he has some chronic episodic low back pain, seems to be "leaning to the right", he has had some issues with low back pain with muscle strain, thinks may be worse with fall, and his daughter is a physical trainer and he is asking about what regimen to do - He is taking Tylenol PRN for elbow and back, has a compression sleeve for L arm as well - He is still working out at gym 3 x week - He had prior X-ray Lumbar spine 2014, see results below  Denies weakness in legs, numbness tingling, fever chills, bruising swelling   Depression screen [facility] 2/9 03/07/2019 09/06/2018 09/06/2018  Decreased Interest 0 0 0  Down, Depressed, Hopeless 0 0 0  PHQ - 2 Score 0 0 0  Altered sleeping - 0 -  Tired, decreased energy - 0 -  Change in appetite - 0 -  Feeling bad or failure about yourself  - 0 -  Trouble concentrating - 0 -  Moving slowly or fidgety/restless - 0 -  Suicidal thoughts - 0 -  PHQ-9 Score - 0 -  Difficult doing work/chores - Not difficult at all -    Social History   Tobacco Use  . Smoking status: Never Smoker  . Smokeless tobacco: Never Used  Substance Use Topics  . Alcohol use: No    Alcohol/week: 0.0 standard drinks  . Drug use: No    Review of Systems Per HPI unless specifically indicated above     Objective:    BP 113/64   Pulse (!) 50   Temp  98.4 F (36.9 C) (Oral)   Resp 16   Ht 5\' 9"  (1.753 m)   Wt 158 lb (71.7 kg)   BMI 23.33 kg/m   Wt Readings from Last 3 Encounters:  03/07/19 158 lb (71.7 kg)  09/06/18 154 lb (69.9 kg)  09/06/18 154 lb (69.9 kg)    Physical Exam Vitals and nursing note reviewed.  Constitutional:      General: He is not in acute distress.    Appearance: He is well-developed. He is not diaphoretic.     Comments: Well-appearing, comfortable, cooperative  HENT:     Head: Normocephalic and atraumatic.  Eyes:     General:        Right eye: No discharge.        Left eye: No discharge.     Conjunctiva/sclera: Conjunctivae normal.  Cardiovascular:     Rate and Rhythm: Normal rate.  Pulmonary:     Effort: Pulmonary effort is normal.  Musculoskeletal:     Comments: Left elbow upper ext Full range of motion Mild tender over olecranon No edema or effusion No ecchymosis No localized deformity  Low Back Inspection: Normal appearance, thin body habitus, no spinal deformity,  symmetrical. Palpation: Some abnormality with spinous processes seems to have area of possible step off or misalignment but non tender or no other abnormality. - LEFT paraspinal muscle hypertonicity ROM: Full active ROM forward flex / back extension, rotation L/R without discomfort Special Testing: Seated SLR negative for radicular pain bilaterally. Strength: Bilateral hip flex/ext 5/5, knee flex/ext 5/5, ankle dorsiflex/plantarflex 5/5 Neurovascular: intact distal sensation to light touch   Skin:    General: Skin is warm and dry.     Findings: No erythema or rash.  Neurological:     Mental Status: He is alert and oriented to person, place, and time.  Psychiatric:        Behavior: Behavior normal.     Comments: Well groomed, good eye contact, normal speech and thoughts      I have personally reviewed the radiology report from Lumbar Spine X-ray on 2014.    PRIOR REPORT IMPORTED FROM AN EXTERNAL SYSTEM *   PRIOR  REPORT IMPORTED FROM THE [employer] WORKFLOW SYSTEM   REASON FOR EXAM:    low back pain  COMMENTS:   PROCEDURE:     KDR - KDXR LUMBAR SPINE WITH OBLIQUES  - May 02 2012   1:55PM   RESULT:     Images of the lumbar spine demonstrate multiple small bony  densities overlying the psoas margin on the right just inferior to the  right  L3 transverse process tip which could represent multiple mid right  ureteral  stones. Correlate for the patient's symptoms. The facets appear to be  normally aligned. There is mild facet hypertrophy at L4-L5 and L5-S1.  There  is no compression deformity or subluxation. There is no bony sclerotic or  lytic mass.   IMPRESSION:      Cannot exclude some right ureterolithiasis. Mild  degenerative changes are present in the spine.   Dictation Site: 2   Results for orders placed or performed in visit on 09/01/18  TSH  Result Value Ref Range   TSH 1.50 0.40 - 4.50 mIU/L  VITAMIN D 25 Hydroxy (Vit-D Deficiency, Fractures)  Result Value Ref Range   Vit D, 25-Hydroxy 43 30 - 100 ng/mL  PSA  Result Value Ref Range   PSA 1.5 < OR = 4.0 ng/mL  Lipid panel  Result Value Ref Range   Cholesterol 127 <200 mg/dL   HDL 56 > OR = 40 mg/dL   Triglycerides 60 <150 mg/dL   LDL Cholesterol (Calc) 57 mg/dL (calc)   Total CHOL/HDL Ratio 2.3 <5.0 (calc)   Non-HDL Cholesterol (Calc) 71 <130 mg/dL (calc)  COMPLETE METABOLIC PANEL WITH GFR  Result Value Ref Range   Glucose, Bld 88 65 - 99 mg/dL   BUN 19 7 - 25 mg/dL   Creat 0.74 0.70 - 1.18 mg/dL   GFR, Est Non African American 90 > OR = 60 mL/min/1.51m2   GFR, Est African American 104 > OR = 60 mL/min/1.73m2   BUN/Creatinine Ratio NOT APPLICABLE 6 - 22 (calc)   Sodium 141 135 - 146 mmol/L   Potassium 4.0 3.5 - 5.3 mmol/L   Chloride 105 98 - 110 mmol/L   CO2 27 20 - 32 mmol/L   Calcium 9.0 8.6 - 10.3 mg/dL   Total Protein 6.6 6.1 - 8.1 g/dL   Albumin 4.2 3.6 - 5.1 g/dL   Globulin 2.4 1.9 - 3.7 g/dL (calc)   AG  Ratio 1.8 1.0 - 2.5 (calc)   Total Bilirubin 0.5 0.2 - 1.2 mg/dL  Alkaline phosphatase (APISO) 59 35 - 144 U/L   AST 26 10 - 35 U/L   ALT 37 9 - 46 U/L  CBC with Differential/Platelet  Result Value Ref Range   WBC 5.5 3.8 - 10.8 Thousand/uL   RBC 5.14 4.20 - 5.80 Million/uL   Hemoglobin 15.4 13.2 - 17.1 g/dL   HCT 46.5 38.5 - 50.0 %   MCV 90.5 80.0 - 100.0 fL   MCH 30.0 27.0 - 33.0 pg   MCHC 33.1 32.0 - 36.0 g/dL   RDW 12.5 11.0 - 15.0 %   Platelets 170 140 - 400 Thousand/uL   MPV 10.0 7.5 - 12.5 fL   Neutro Abs 3,361 1,500 - 7,800 cells/uL   Lymphs Abs 1,315 850 - 3,900 cells/uL   Absolute Monocytes 292 200 - 950 cells/uL   Eosinophils Absolute 462 15 - 500 cells/uL   Basophils Absolute 72 0 - 200 cells/uL   Neutrophils Relative % 61.1 %   Total Lymphocyte 23.9 %   Monocytes Relative 5.3 %   Eosinophils Relative 8.4 %   Basophils Relative 1.3 %  Hemoglobin A1c  Result Value Ref Range   Hgb A1c MFr Bld 5.0 <5.7 % of total Hgb   Mean Plasma Glucose 97 (calc)   eAG (mmol/L) 5.4 (calc)      Assessment & Plan:   Problem List Items Addressed This Visit    None    Visit Diagnoses    Contusion of left elbow, initial encounter    -  Primary   Fall, initial encounter       Chronic left-sided low back pain without sciatica       Relevant Orders   DG Lumbar Spine Complete      Subacute on chronic L>R LBP without sciatica. Suspect likely due to muscle spasm/strain, secondary to chronic back arthritis may have recent flare with fall but seems chronic issue In setting of known chronic LBP with DJD - No red flag symptoms. Negative SLR for radiculopathy - Inadequate conservative therapy   Plan: 1. X-ray today - given chronic problem, last compared 2014 2. May use OTC NSAID / Tylenol PRN 3. Future offer muscle relaxant Baclofen PRN - defer for now 4. Encouraged use of heating pad 1-2x daily for now then PRN 5. After x-ray results, I advised that he can pursue either PT or  Chiropractor, gave him handout back exercises, notify us whichever he prefer and we can refer  #Left elbow contusion Secondary to traumatic fall, without other significant injury No sign of effusion or ecchymosis Intact ROM. No evidence to support fracture today. Reassurance, conservative care, may use RICE therapy  Orders Placed This Encounter  Procedures  . DG Lumbar Spine Complete    Standing Status:   Future    Number of Occurrences:   1    Standing Expiration Date:   05/04/2020    Order Specific Question:   Reason for Exam (SYMPTOM  OR DIAGNOSIS REQUIRED)    Answer:   chronic low back pain L>R with muscle spasm, possible recent strain, known osteoarthritis DJD    Order Specific Question:   Preferred imaging location?    Answer:   [facility]    Order Specific Question:   Radiology Contrast Protocol - do NOT remove file path    Answer:   [email or web address]     No orders of the defined types were placed in this encounter.     Follow up plan: Return in about 6 weeks (around  04/18/2019), or if symptoms worsen or fail to improve, for back pain.    Nobie Putnam, [facility] 03/07/2019, 10:27 AM

## 2019-04-04 DIAGNOSIS — R262 Difficulty in walking, not elsewhere classified: Secondary | ICD-10-CM | POA: Diagnosis not present

## 2019-04-04 DIAGNOSIS — M4184 Other forms of scoliosis, thoracic region: Secondary | ICD-10-CM | POA: Diagnosis not present

## 2019-04-04 DIAGNOSIS — M5441 Lumbago with sciatica, right side: Secondary | ICD-10-CM | POA: Diagnosis not present

## 2019-04-04 DIAGNOSIS — M4187 Other forms of scoliosis, lumbosacral region: Secondary | ICD-10-CM | POA: Diagnosis not present

## 2019-04-05 DIAGNOSIS — M4187 Other forms of scoliosis, lumbosacral region: Secondary | ICD-10-CM | POA: Diagnosis not present

## 2019-04-05 DIAGNOSIS — M4184 Other forms of scoliosis, thoracic region: Secondary | ICD-10-CM | POA: Diagnosis not present

## 2019-04-05 DIAGNOSIS — M5441 Lumbago with sciatica, right side: Secondary | ICD-10-CM | POA: Diagnosis not present

## 2019-04-05 DIAGNOSIS — R262 Difficulty in walking, not elsewhere classified: Secondary | ICD-10-CM | POA: Diagnosis not present

## 2019-04-12 DIAGNOSIS — R262 Difficulty in walking, not elsewhere classified: Secondary | ICD-10-CM | POA: Diagnosis not present

## 2019-04-12 DIAGNOSIS — M4187 Other forms of scoliosis, lumbosacral region: Secondary | ICD-10-CM | POA: Diagnosis not present

## 2019-04-12 DIAGNOSIS — M5441 Lumbago with sciatica, right side: Secondary | ICD-10-CM | POA: Diagnosis not present

## 2019-04-12 DIAGNOSIS — M4184 Other forms of scoliosis, thoracic region: Secondary | ICD-10-CM | POA: Diagnosis not present

## 2019-04-14 DIAGNOSIS — M4184 Other forms of scoliosis, thoracic region: Secondary | ICD-10-CM | POA: Diagnosis not present

## 2019-04-14 DIAGNOSIS — M4187 Other forms of scoliosis, lumbosacral region: Secondary | ICD-10-CM | POA: Diagnosis not present

## 2019-04-14 DIAGNOSIS — H353131 Nonexudative age-related macular degeneration, bilateral, early dry stage: Secondary | ICD-10-CM | POA: Diagnosis not present

## 2019-04-14 DIAGNOSIS — M5441 Lumbago with sciatica, right side: Secondary | ICD-10-CM | POA: Diagnosis not present

## 2019-04-14 DIAGNOSIS — R262 Difficulty in walking, not elsewhere classified: Secondary | ICD-10-CM | POA: Diagnosis not present

## 2019-04-17 DIAGNOSIS — R262 Difficulty in walking, not elsewhere classified: Secondary | ICD-10-CM | POA: Diagnosis not present

## 2019-04-17 DIAGNOSIS — M5441 Lumbago with sciatica, right side: Secondary | ICD-10-CM | POA: Diagnosis not present

## 2019-04-17 DIAGNOSIS — M4187 Other forms of scoliosis, lumbosacral region: Secondary | ICD-10-CM | POA: Diagnosis not present

## 2019-04-17 DIAGNOSIS — M4184 Other forms of scoliosis, thoracic region: Secondary | ICD-10-CM | POA: Diagnosis not present

## 2019-04-18 DIAGNOSIS — R262 Difficulty in walking, not elsewhere classified: Secondary | ICD-10-CM | POA: Diagnosis not present

## 2019-04-18 DIAGNOSIS — M4187 Other forms of scoliosis, lumbosacral region: Secondary | ICD-10-CM | POA: Diagnosis not present

## 2019-04-18 DIAGNOSIS — M4184 Other forms of scoliosis, thoracic region: Secondary | ICD-10-CM | POA: Diagnosis not present

## 2019-04-18 DIAGNOSIS — M5441 Lumbago with sciatica, right side: Secondary | ICD-10-CM | POA: Diagnosis not present

## 2019-04-24 DIAGNOSIS — R262 Difficulty in walking, not elsewhere classified: Secondary | ICD-10-CM | POA: Diagnosis not present

## 2019-04-24 DIAGNOSIS — M5441 Lumbago with sciatica, right side: Secondary | ICD-10-CM | POA: Diagnosis not present

## 2019-04-24 DIAGNOSIS — M4187 Other forms of scoliosis, lumbosacral region: Secondary | ICD-10-CM | POA: Diagnosis not present

## 2019-04-24 DIAGNOSIS — M4184 Other forms of scoliosis, thoracic region: Secondary | ICD-10-CM | POA: Diagnosis not present

## 2019-04-25 ENCOUNTER — Other Ambulatory Visit: Payer: Self-pay

## 2019-04-25 ENCOUNTER — Encounter: Payer: Self-pay | Admitting: Family Medicine

## 2019-04-25 ENCOUNTER — Ambulatory Visit (INDEPENDENT_AMBULATORY_CARE_PROVIDER_SITE_OTHER): Payer: Medicare PPO | Admitting: Family Medicine

## 2019-04-25 DIAGNOSIS — M5441 Lumbago with sciatica, right side: Secondary | ICD-10-CM

## 2019-04-25 MED ORDER — BACLOFEN 10 MG PO TABS: 5.0000 mg | ORAL_TABLET | Freq: Two times a day (BID) | ORAL | 1 refills | Status: DC | PRN

## 2019-04-25 MED ORDER — MELOXICAM 15 MG PO TABS: 7.5000 mg | ORAL_TABLET | Freq: Every day | ORAL | 1 refills | Status: DC | PRN
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
## 2019-04-26 DIAGNOSIS — M5441 Lumbago with sciatica, right side: Secondary | ICD-10-CM | POA: Diagnosis not present

## 2019-04-26 DIAGNOSIS — M4184 Other forms of scoliosis, thoracic region: Secondary | ICD-10-CM | POA: Diagnosis not present

## 2019-04-26 DIAGNOSIS — R262 Difficulty in walking, not elsewhere classified: Secondary | ICD-10-CM | POA: Diagnosis not present

## 2019-04-26 DIAGNOSIS — M4187 Other forms of scoliosis, lumbosacral region: Secondary | ICD-10-CM | POA: Diagnosis not present

## 2019-04-28 ENCOUNTER — Telehealth: Payer: Self-pay | Admitting: Psychiatry

## 2019-04-28 ENCOUNTER — Other Ambulatory Visit: Payer: Self-pay

## 2019-04-28 MED ORDER — L-METHYLFOLATE 15 MG PO TABS: 15.0000 mg | ORAL_TABLET | Freq: Every day | ORAL | 3 refills | Status: DC

## 2019-04-28 NOTE — Telephone Encounter (Signed)
RX submitted for 90 day 3 refills

## 2019-04-28 NOTE — Telephone Encounter (Signed)
Joseph Arroyo called to request refill of his L-methyfolate.  Gets the best price at the [facility] in [place]. Wants a 90 day supply.  Appt 08/22/19

## 2019-05-02 ENCOUNTER — Other Ambulatory Visit: Payer: Self-pay | Admitting: Family Medicine

## 2019-05-02 DIAGNOSIS — M5441 Lumbago with sciatica, right side: Secondary | ICD-10-CM

## 2019-05-03 DIAGNOSIS — M5441 Lumbago with sciatica, right side: Secondary | ICD-10-CM | POA: Diagnosis not present

## 2019-05-03 DIAGNOSIS — M4184 Other forms of scoliosis, thoracic region: Secondary | ICD-10-CM | POA: Diagnosis not present

## 2019-05-03 DIAGNOSIS — M4187 Other forms of scoliosis, lumbosacral region: Secondary | ICD-10-CM | POA: Diagnosis not present

## 2019-05-03 DIAGNOSIS — R262 Difficulty in walking, not elsewhere classified: Secondary | ICD-10-CM | POA: Diagnosis not present

## 2019-05-05 DIAGNOSIS — M4187 Other forms of scoliosis, lumbosacral region: Secondary | ICD-10-CM | POA: Diagnosis not present

## 2019-05-05 DIAGNOSIS — M4184 Other forms of scoliosis, thoracic region: Secondary | ICD-10-CM | POA: Diagnosis not present

## 2019-05-05 DIAGNOSIS — M5441 Lumbago with sciatica, right side: Secondary | ICD-10-CM | POA: Diagnosis not present

## 2019-05-05 DIAGNOSIS — R262 Difficulty in walking, not elsewhere classified: Secondary | ICD-10-CM | POA: Diagnosis not present

## 2019-05-09 DIAGNOSIS — M4187 Other forms of scoliosis, lumbosacral region: Secondary | ICD-10-CM | POA: Diagnosis not present

## 2019-05-09 DIAGNOSIS — R262 Difficulty in walking, not elsewhere classified: Secondary | ICD-10-CM | POA: Diagnosis not present

## 2019-05-09 DIAGNOSIS — M4184 Other forms of scoliosis, thoracic region: Secondary | ICD-10-CM | POA: Diagnosis not present

## 2019-05-09 DIAGNOSIS — M5441 Lumbago with sciatica, right side: Secondary | ICD-10-CM | POA: Diagnosis not present

## 2019-05-11 ENCOUNTER — Other Ambulatory Visit (INDEPENDENT_AMBULATORY_CARE_PROVIDER_SITE_OTHER): Payer: Self-pay | Admitting: Vascular Surgery

## 2019-05-11 DIAGNOSIS — M4184 Other forms of scoliosis, thoracic region: Secondary | ICD-10-CM | POA: Diagnosis not present

## 2019-05-11 DIAGNOSIS — R262 Difficulty in walking, not elsewhere classified: Secondary | ICD-10-CM | POA: Diagnosis not present

## 2019-05-11 DIAGNOSIS — M4187 Other forms of scoliosis, lumbosacral region: Secondary | ICD-10-CM | POA: Diagnosis not present

## 2019-05-11 DIAGNOSIS — M5441 Lumbago with sciatica, right side: Secondary | ICD-10-CM | POA: Diagnosis not present

## 2019-05-11 DIAGNOSIS — I6523 Occlusion and stenosis of bilateral carotid arteries: Secondary | ICD-10-CM

## 2019-05-15 ENCOUNTER — Ambulatory Visit (INDEPENDENT_AMBULATORY_CARE_PROVIDER_SITE_OTHER): Payer: Medicare PPO

## 2019-05-15 ENCOUNTER — Encounter (INDEPENDENT_AMBULATORY_CARE_PROVIDER_SITE_OTHER): Payer: Self-pay | Admitting: Vascular Surgery

## 2019-05-15 ENCOUNTER — Other Ambulatory Visit: Payer: Self-pay

## 2019-05-15 ENCOUNTER — Ambulatory Visit (INDEPENDENT_AMBULATORY_CARE_PROVIDER_SITE_OTHER): Payer: Medicare Other | Admitting: Vascular Surgery

## 2019-05-15 VITALS — BP 121/65 | HR 55 | Resp 16 | Wt 157.6 lb

## 2019-05-15 DIAGNOSIS — I6523 Occlusion and stenosis of bilateral carotid arteries: Secondary | ICD-10-CM | POA: Diagnosis not present

## 2019-05-15 DIAGNOSIS — M4184 Other forms of scoliosis, thoracic region: Secondary | ICD-10-CM | POA: Diagnosis not present

## 2019-05-15 DIAGNOSIS — M4187 Other forms of scoliosis, lumbosacral region: Secondary | ICD-10-CM | POA: Diagnosis not present

## 2019-05-15 DIAGNOSIS — M5441 Lumbago with sciatica, right side: Secondary | ICD-10-CM | POA: Diagnosis not present

## 2019-05-15 DIAGNOSIS — R262 Difficulty in walking, not elsewhere classified: Secondary | ICD-10-CM | POA: Diagnosis not present
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
## 2019-05-17 ENCOUNTER — Other Ambulatory Visit: Payer: Self-pay | Admitting: Family Medicine

## 2019-05-17 DIAGNOSIS — M4187 Other forms of scoliosis, lumbosacral region: Secondary | ICD-10-CM | POA: Diagnosis not present

## 2019-05-17 DIAGNOSIS — R262 Difficulty in walking, not elsewhere classified: Secondary | ICD-10-CM | POA: Diagnosis not present

## 2019-05-17 DIAGNOSIS — M5441 Lumbago with sciatica, right side: Secondary | ICD-10-CM

## 2019-05-17 DIAGNOSIS — M4184 Other forms of scoliosis, thoracic region: Secondary | ICD-10-CM | POA: Diagnosis not present

## 2019-05-22 DIAGNOSIS — R262 Difficulty in walking, not elsewhere classified: Secondary | ICD-10-CM | POA: Diagnosis not present

## 2019-05-22 DIAGNOSIS — M4184 Other forms of scoliosis, thoracic region: Secondary | ICD-10-CM | POA: Diagnosis not present

## 2019-05-22 DIAGNOSIS — M5441 Lumbago with sciatica, right side: Secondary | ICD-10-CM | POA: Diagnosis not present

## 2019-05-22 DIAGNOSIS — M4187 Other forms of scoliosis, lumbosacral region: Secondary | ICD-10-CM | POA: Diagnosis not present

## 2019-05-23 ENCOUNTER — Other Ambulatory Visit: Payer: Self-pay | Admitting: Family Medicine

## 2019-05-23 DIAGNOSIS — M5441 Lumbago with sciatica, right side: Secondary | ICD-10-CM

## 2019-05-26 DIAGNOSIS — M4187 Other forms of scoliosis, lumbosacral region: Secondary | ICD-10-CM | POA: Diagnosis not present

## 2019-05-26 DIAGNOSIS — M4184 Other forms of scoliosis, thoracic region: Secondary | ICD-10-CM | POA: Diagnosis not present

## 2019-05-26 DIAGNOSIS — M5441 Lumbago with sciatica, right side: Secondary | ICD-10-CM | POA: Diagnosis not present

## 2019-05-26 DIAGNOSIS — R262 Difficulty in walking, not elsewhere classified: Secondary | ICD-10-CM | POA: Diagnosis not present

## 2019-05-30 DIAGNOSIS — M4187 Other forms of scoliosis, lumbosacral region: Secondary | ICD-10-CM | POA: Diagnosis not present

## 2019-05-30 DIAGNOSIS — M5441 Lumbago with sciatica, right side: Secondary | ICD-10-CM | POA: Diagnosis not present

## 2019-05-30 DIAGNOSIS — M4184 Other forms of scoliosis, thoracic region: Secondary | ICD-10-CM | POA: Diagnosis not present

## 2019-05-30 DIAGNOSIS — R262 Difficulty in walking, not elsewhere classified: Secondary | ICD-10-CM | POA: Diagnosis not present

## 2019-06-01 DIAGNOSIS — M5441 Lumbago with sciatica, right side: Secondary | ICD-10-CM | POA: Diagnosis not present

## 2019-06-01 DIAGNOSIS — R262 Difficulty in walking, not elsewhere classified: Secondary | ICD-10-CM | POA: Diagnosis not present

## 2019-06-01 DIAGNOSIS — M4187 Other forms of scoliosis, lumbosacral region: Secondary | ICD-10-CM | POA: Diagnosis not present

## 2019-06-01 DIAGNOSIS — M4184 Other forms of scoliosis, thoracic region: Secondary | ICD-10-CM | POA: Diagnosis not present

## 2019-06-07 DIAGNOSIS — M4187 Other forms of scoliosis, lumbosacral region: Secondary | ICD-10-CM | POA: Diagnosis not present

## 2019-06-07 DIAGNOSIS — M5441 Lumbago with sciatica, right side: Secondary | ICD-10-CM | POA: Diagnosis not present

## 2019-06-07 DIAGNOSIS — M4184 Other forms of scoliosis, thoracic region: Secondary | ICD-10-CM | POA: Diagnosis not present

## 2019-06-07 DIAGNOSIS — R262 Difficulty in walking, not elsewhere classified: Secondary | ICD-10-CM | POA: Diagnosis not present

## 2019-06-09 DIAGNOSIS — M5441 Lumbago with sciatica, right side: Secondary | ICD-10-CM | POA: Diagnosis not present

## 2019-06-09 DIAGNOSIS — M4187 Other forms of scoliosis, lumbosacral region: Secondary | ICD-10-CM | POA: Diagnosis not present

## 2019-06-09 DIAGNOSIS — M4184 Other forms of scoliosis, thoracic region: Secondary | ICD-10-CM | POA: Diagnosis not present

## 2019-06-09 DIAGNOSIS — R262 Difficulty in walking, not elsewhere classified: Secondary | ICD-10-CM | POA: Diagnosis not present

## 2019-06-14 DIAGNOSIS — M5441 Lumbago with sciatica, right side: Secondary | ICD-10-CM | POA: Diagnosis not present

## 2019-06-14 DIAGNOSIS — R262 Difficulty in walking, not elsewhere classified: Secondary | ICD-10-CM | POA: Diagnosis not present

## 2019-06-14 DIAGNOSIS — M4187 Other forms of scoliosis, lumbosacral region: Secondary | ICD-10-CM | POA: Diagnosis not present

## 2019-06-14 DIAGNOSIS — M4184 Other forms of scoliosis, thoracic region: Secondary | ICD-10-CM | POA: Diagnosis not present

## 2019-06-16 DIAGNOSIS — M4184 Other forms of scoliosis, thoracic region: Secondary | ICD-10-CM | POA: Diagnosis not present

## 2019-06-16 DIAGNOSIS — M5441 Lumbago with sciatica, right side: Secondary | ICD-10-CM | POA: Diagnosis not present

## 2019-06-16 DIAGNOSIS — M4187 Other forms of scoliosis, lumbosacral region: Secondary | ICD-10-CM | POA: Diagnosis not present

## 2019-06-16 DIAGNOSIS — R262 Difficulty in walking, not elsewhere classified: Secondary | ICD-10-CM | POA: Diagnosis not present

## 2019-06-17 ENCOUNTER — Other Ambulatory Visit: Payer: Self-pay | Admitting: Family Medicine

## 2019-06-17 DIAGNOSIS — M5441 Lumbago with sciatica, right side: Secondary | ICD-10-CM

## 2019-06-21 DIAGNOSIS — M4187 Other forms of scoliosis, lumbosacral region: Secondary | ICD-10-CM | POA: Diagnosis not present

## 2019-06-21 DIAGNOSIS — M4184 Other forms of scoliosis, thoracic region: Secondary | ICD-10-CM | POA: Diagnosis not present

## 2019-06-21 DIAGNOSIS — R262 Difficulty in walking, not elsewhere classified: Secondary | ICD-10-CM | POA: Diagnosis not present

## 2019-06-21 DIAGNOSIS — M5441 Lumbago with sciatica, right side: Secondary | ICD-10-CM | POA: Diagnosis not present

## 2019-06-23 DIAGNOSIS — M4187 Other forms of scoliosis, lumbosacral region: Secondary | ICD-10-CM | POA: Diagnosis not present

## 2019-06-23 DIAGNOSIS — R262 Difficulty in walking, not elsewhere classified: Secondary | ICD-10-CM | POA: Diagnosis not present

## 2019-06-23 DIAGNOSIS — M5441 Lumbago with sciatica, right side: Secondary | ICD-10-CM | POA: Diagnosis not present

## 2019-06-23 DIAGNOSIS — M4184 Other forms of scoliosis, thoracic region: Secondary | ICD-10-CM | POA: Diagnosis not present

## 2019-06-27 DIAGNOSIS — R262 Difficulty in walking, not elsewhere classified: Secondary | ICD-10-CM | POA: Diagnosis not present

## 2019-06-27 DIAGNOSIS — M4187 Other forms of scoliosis, lumbosacral region: Secondary | ICD-10-CM | POA: Diagnosis not present

## 2019-06-27 DIAGNOSIS — M4184 Other forms of scoliosis, thoracic region: Secondary | ICD-10-CM | POA: Diagnosis not present

## 2019-06-27 DIAGNOSIS — M5441 Lumbago with sciatica, right side: Secondary | ICD-10-CM | POA: Diagnosis not present

## 2019-06-29 DIAGNOSIS — M4184 Other forms of scoliosis, thoracic region: Secondary | ICD-10-CM | POA: Diagnosis not present

## 2019-06-29 DIAGNOSIS — M4187 Other forms of scoliosis, lumbosacral region: Secondary | ICD-10-CM | POA: Diagnosis not present

## 2019-06-29 DIAGNOSIS — R262 Difficulty in walking, not elsewhere classified: Secondary | ICD-10-CM | POA: Diagnosis not present

## 2019-06-29 DIAGNOSIS — M5441 Lumbago with sciatica, right side: Secondary | ICD-10-CM | POA: Diagnosis not present

## 2019-07-03 DIAGNOSIS — M4184 Other forms of scoliosis, thoracic region: Secondary | ICD-10-CM | POA: Diagnosis not present

## 2019-07-03 DIAGNOSIS — M5441 Lumbago with sciatica, right side: Secondary | ICD-10-CM | POA: Diagnosis not present

## 2019-07-03 DIAGNOSIS — R262 Difficulty in walking, not elsewhere classified: Secondary | ICD-10-CM | POA: Diagnosis not present

## 2019-07-03 DIAGNOSIS — M4187 Other forms of scoliosis, lumbosacral region: Secondary | ICD-10-CM | POA: Diagnosis not present

## 2019-07-04 DIAGNOSIS — G20 Parkinson's disease: Secondary | ICD-10-CM | POA: Diagnosis not present

## 2019-07-06 DIAGNOSIS — R262 Difficulty in walking, not elsewhere classified: Secondary | ICD-10-CM | POA: Diagnosis not present

## 2019-07-06 DIAGNOSIS — M5441 Lumbago with sciatica, right side: Secondary | ICD-10-CM | POA: Diagnosis not present

## 2019-07-06 DIAGNOSIS — M4187 Other forms of scoliosis, lumbosacral region: Secondary | ICD-10-CM | POA: Diagnosis not present

## 2019-07-06 DIAGNOSIS — M4184 Other forms of scoliosis, thoracic region: Secondary | ICD-10-CM | POA: Diagnosis not present

## 2019-07-10 DIAGNOSIS — R262 Difficulty in walking, not elsewhere classified: Secondary | ICD-10-CM | POA: Diagnosis not present

## 2019-07-10 DIAGNOSIS — M4184 Other forms of scoliosis, thoracic region: Secondary | ICD-10-CM | POA: Diagnosis not present

## 2019-07-10 DIAGNOSIS — M4187 Other forms of scoliosis, lumbosacral region: Secondary | ICD-10-CM | POA: Diagnosis not present

## 2019-07-10 DIAGNOSIS — M5441 Lumbago with sciatica, right side: Secondary | ICD-10-CM | POA: Diagnosis not present

## 2019-07-12 DIAGNOSIS — R262 Difficulty in walking, not elsewhere classified: Secondary | ICD-10-CM | POA: Diagnosis not present

## 2019-07-12 DIAGNOSIS — M5441 Lumbago with sciatica, right side: Secondary | ICD-10-CM | POA: Diagnosis not present

## 2019-07-12 DIAGNOSIS — M4187 Other forms of scoliosis, lumbosacral region: Secondary | ICD-10-CM | POA: Diagnosis not present

## 2019-07-12 DIAGNOSIS — M4184 Other forms of scoliosis, thoracic region: Secondary | ICD-10-CM | POA: Diagnosis not present

## 2019-07-19 DIAGNOSIS — M4187 Other forms of scoliosis, lumbosacral region: Secondary | ICD-10-CM | POA: Diagnosis not present

## 2019-07-19 DIAGNOSIS — M4184 Other forms of scoliosis, thoracic region: Secondary | ICD-10-CM | POA: Diagnosis not present

## 2019-07-19 DIAGNOSIS — R262 Difficulty in walking, not elsewhere classified: Secondary | ICD-10-CM | POA: Diagnosis not present

## 2019-07-19 DIAGNOSIS — M5441 Lumbago with sciatica, right side: Secondary | ICD-10-CM | POA: Diagnosis not present

## 2019-07-21 DIAGNOSIS — M5441 Lumbago with sciatica, right side: Secondary | ICD-10-CM | POA: Diagnosis not present

## 2019-07-21 DIAGNOSIS — M4184 Other forms of scoliosis, thoracic region: Secondary | ICD-10-CM | POA: Diagnosis not present

## 2019-07-21 DIAGNOSIS — R262 Difficulty in walking, not elsewhere classified: Secondary | ICD-10-CM | POA: Diagnosis not present

## 2019-07-21 DIAGNOSIS — M4187 Other forms of scoliosis, lumbosacral region: Secondary | ICD-10-CM | POA: Diagnosis not present

## 2019-08-15 ENCOUNTER — Other Ambulatory Visit: Payer: Self-pay | Admitting: Psychiatry

## 2019-08-15 DIAGNOSIS — F411 Generalized anxiety disorder: Secondary | ICD-10-CM

## 2019-08-15 DIAGNOSIS — F3342 Major depressive disorder, recurrent, in full remission: Secondary | ICD-10-CM

## 2019-08-22 ENCOUNTER — Other Ambulatory Visit: Payer: Self-pay

## 2019-08-22 ENCOUNTER — Encounter: Payer: Self-pay | Admitting: Psychiatry

## 2019-08-22 ENCOUNTER — Ambulatory Visit (INDEPENDENT_AMBULATORY_CARE_PROVIDER_SITE_OTHER): Payer: Medicare Other | Admitting: Psychiatry

## 2019-08-22 DIAGNOSIS — F411 Generalized anxiety disorder: Secondary | ICD-10-CM

## 2019-08-22 DIAGNOSIS — F3342 Major depressive disorder, recurrent, in full remission: Secondary | ICD-10-CM | POA: Diagnosis not present

## 2019-08-22 NOTE — Progress Notes (Signed)
Joseph Arroyo [id number] [birth year] 79 y.o.  Subjective:   Patient ID:  Joseph Arroyo is a 79 y.o. (DOB [birth year]) male.  Chief Complaint:  Chief Complaint  Patient presents with  . Follow-up    mood and anxity    Depression        Associated symptoms include fatigue.  Joseph Arroyo presents to the office today for follow-up of major depression and generalized anxiety disorder.    He was last seen December, 2020.  No meds were changed.  Continued paroxetine 60 mg daily plus gabapentin 100 mg twice daily and Mirapex 0.125 mg 3 times daily.   Tolerates the meds well.  Wife thinks he's doing very well and he agrees.   No complaints.  2 D's.  One just recovering from Covid.   Has chosen not to get the vaccine bc don't trust it.   Still exercising and has fatigue but can function and enjoys activity.  Restarted men's Bible study.  He feels positive and hopeful and useful.  At some point would like to be free of medication.  Does not feel guilty about the meds.  Patient reports stable mood and denies depressed or irritable moods.  Patient denies any recent difficulty with anxiety.  Patient denies difficulty with sleep initiation or maintenance. Denies appetite disturbance.  Patient reports that energyis not as good as it should be but activity and  motivation have been good.  Patient denies any difficulty with concentration.  Patient denies any suicidal ideation.  Was 12 years free of medication until this last relapse and yet it took a long time to get relief again.  Wife, [name], said overall he's been doing well.  No prn BZ in a couple of years.  Past Psychiatric Medication Trials: Under our care since 2013. Paroxetine 60, duloxetine, fluoxetine, Luvox, venlafaxine, Trintellix, , mirtazapine, gabapentin, Deplin, clonazepam, lorazepam  lamotrigine,  buspirone,  Abilify, lithium with some benefit, Seroquel, Zyprexa,  risperidone, Saphris, doxepin,  pramipexole  Review  of Systems:  Review of Systems  Constitutional: Positive for fatigue.  Neurological: Positive for tremors and weakness. Negative for dizziness.  Psychiatric/Behavioral: Positive for depression.    Medications: I have reviewed the patient's current medications.  Current Outpatient Medications  Medication Sig Dispense Refill  . B Complex Vitamins (VITAMIN B COMPLEX PO) Take 1 tablet by mouth daily.    . Capsicum-Garlic [id number] MG CAPS Take 200-300 mg by mouth.    . carbidopa-levodopa (SINEMET IR) 25-100 MG tablet Take 2 tablets by mouth 3 (three) times daily.    . clotrimazole-betamethasone (LOTRISONE) cream Apply 1-2 times a day for worsening flare dry skin dermatitis of toes/feet, may re-use daily up to 1 week as needed. 30 g 1  . fluticasone (FLONASE) 50 MCG/ACT nasal spray SPRAY 2 SPRAYS INTO EACH NOSTRIL EVERY DAY 16 g 8  . gabapentin (NEURONTIN) 100 MG capsule TAKE 1 CAPSULE BY MOUTH TWICE A DAY 180 capsule 1  . HAWTHORNE BERRY PO Take by mouth daily.    Marland Kitchen ibuprofen (ADVIL,MOTRIN) 200 MG tablet Take 200 mg by mouth every 6 (six) hours as needed for moderate pain.    Marland Kitchen L-Methylfolate 15 MG TABS Take 1 tablet (15 mg total) by mouth daily. 90 tablet 3  . Lutein 10 MG TABS Take 1 tablet by mouth 3 (three) times daily.    . meloxicam (MOBIC) 15 MG tablet TAKE 0.5-1 TABLETS (7.5-15 MG TOTAL) BY MOUTH DAILY AS NEEDED FOR PAIN. 30 tablet 5  .  omega-3 acid ethyl esters (LOVAZA) 1 g capsule Take 1 capsule by mouth daily.    Marland Kitchen PARoxetine (PAXIL) 20 MG tablet TAKE 1 TABLET BY MOUTH EVERY DAY (WITH THE 40 MG TABLET FOR 60 MG TOTAL) 90 tablet 1  . PARoxetine (PAXIL) 40 MG tablet TAKE 1 TABLET BY MOUTH EVERY DAY (WITH THE 20 MG TABLET FOR 60 MG TOTAL) 90 tablet 1  . pramipexole (MIRAPEX) 0.125 MG tablet Take 0.25 mg by mouth in the morning and at bedtime.     . PSYLLIUM HUSK PO Take by mouth.    . Saw Palmetto, Serenoa repens, 1000 MG CAPS Take 2 capsules by mouth daily.    . baclofen (LIORESAL) 10  MG tablet TAKE 0.5-1 TABLETS (5-10 MG TOTAL) BY MOUTH 2 (TWO) TIMES DAILY AS NEEDED FOR MUSCLE SPASMS. (Patient not taking: Reported on 08/22/2019) 30 tablet 1   No current facility-administered medications for this visit.    Medication Side Effects: Fatigue and Other: from Mirapex  Allergies:  Allergies  Allergen Reactions  . Prednisone     Hyperactivity   . Wheat Bran Diarrhea    Past Medical History:  Diagnosis Date  . Allergic rhinitis due to pollen 11/21/2007  . Brachial neuritis or radiculitis NOS   . Cervicalgia   . Concussion    age 32 - s/p accident  . Costal chondritis   . Depression   . Essential and other specified forms of tremor   . GERD (gastroesophageal reflux disease)    in past  . Lumbago   . Occlusion and stenosis of carotid artery without mention of cerebral infarction   . Seizures [facility])    age 21 - after concussion    Family History  Problem Relation Age of Onset  . Heart failure Mother        enlarged heart   . Asthma Father     Social History   Socioeconomic History  . Marital status: Married    Spouse name: Azzan Butler  . Number of children: 2  . Years of education: 76  . Highest education level: Master's degree (e.g., MA, MS, MEng, MEd, MSW, MBA)  Occupational History  . 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 Smoking status: Never Smoker  . Smokeless tobacco: Never Used  Vaping Use  . Vaping Use: Never used  Substance and Sexual Activity  . Alcohol use: No    Alcohol/week: 0.0 standard drinks  . Drug use: No  . Sexual activity: Not Currently  Other Topics Concern  . Not on file  Social History Narrative   Married to [name], has 2 children, has grandchildren   Right handed   Master's plus   He is born in [place], heritage is [place] (Father's side), [place] (Mother's side)      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    Social Determinants of Radio broadcast assistant Strain:   .  Difficulty of Paying Living Expenses:   Food Insecurity:   . Worried About Charity fundraiser in the Last Year:   . Arboriculturist in the Last Year:   Transportation Needs:   . Film/video editor (Medical):   Marland Kitchen Lack of Transportation (Non-Medical):   Physical Activity:   . Days of Exercise per Week:   . Minutes of Exercise per Session:   Stress:   . Feeling of Stress :   Social Connections:   . Frequency of Communication with Friends  and Family:   . Frequency of Social Gatherings with Friends and Family:   . Attends Religious Services:   . Active Member of Clubs or Organizations:   . Attends Archivist Meetings:   Marland Kitchen Marital Status:   Intimate Partner Violence:   . Fear of Current or Ex-Partner:   . Emotionally Abused:   Marland Kitchen Physically Abused:   . Sexually Abused:     Past Medical History, Surgical history, Social history, and Family history were reviewed and updated as appropriate.   Please see review of systems for further details on the patient's review from today.   Objective:   Physical Exam:  There were no vitals taken for this visit.  Physical Exam Constitutional:      General: He is not in acute distress.    Appearance: He is well-developed.  Musculoskeletal:        General: No deformity.  Neurological:     Mental Status: He is alert and oriented to person, place, and time.     Motor: Tremor present.     Coordination: Coordination normal.  Psychiatric:        Attention and Perception: Attention and perception normal. He does not perceive auditory or visual hallucinations.        Mood and Affect: Mood normal. Mood is not anxious or depressed. Affect is not labile, blunt, angry or inappropriate.        Speech: Speech normal.        Behavior: Behavior normal.        Thought Content: Thought content normal. Thought content does not include homicidal or suicidal ideation. Thought content does not include homicidal or suicidal plan.        Cognition  and Memory: Cognition and memory normal.        Judgment: Judgment normal.     Comments: Insight intact.  Talkative. No delusions.      Lab Review:     Component Value Date/Time   NA 141 09/01/2018 0909   NA 143 07/10/2015 0821   K 4.0 09/01/2018 0909   CL 105 09/01/2018 0909   CO2 27 09/01/2018 0909   GLUCOSE 88 09/01/2018 0909   BUN 19 09/01/2018 0909   BUN 13 07/10/2015 0821   CREATININE 0.74 09/01/2018 0909   CALCIUM 9.0 09/01/2018 0909   PROT 6.6 09/01/2018 0909   PROT 6.8 07/10/2015 0821   ALBUMIN 3.6 07/24/2016 1050   ALBUMIN 4.2 07/10/2015 0821   AST 26 09/01/2018 0909   ALT 37 09/01/2018 0909   ALKPHOS 49 07/24/2016 1050   BILITOT 0.5 09/01/2018 0909   BILITOT 0.5 07/10/2015 0821   GFRNONAA 90 09/01/2018 0909   GFRAA 104 09/01/2018 0909       Component Value Date/Time   WBC 5.5 09/01/2018 0909   RBC 5.14 09/01/2018 0909   HGB 15.4 09/01/2018 0909   HGB 15.3 10/23/2014 1237   HCT 46.5 09/01/2018 0909   HCT 45.1 10/23/2014 1237   PLT 170 09/01/2018 0909   PLT 150 10/23/2014 1237   MCV 90.5 09/01/2018 0909   MCV 88 10/23/2014 1237   MCH 30.0 09/01/2018 0909   MCHC 33.1 09/01/2018 0909   RDW 12.5 09/01/2018 0909   RDW 13.3 10/23/2014 1237   LYMPHSABS 1,315 09/01/2018 0909   LYMPHSABS 1.7 10/23/2014 1237   MONOABS 357 07/24/2016 1050   EOSABS 462 09/01/2018 0909   EOSABS 0.3 10/23/2014 1237   BASOSABS 72 09/01/2018 0909   BASOSABS 0.1 10/23/2014 1237  No results found for: POCLITH, LITHIUM   No results found for: PHENYTOIN, PHENOBARB, VALPROATE, CBMZ   .res Assessment: Plan:    Nishant was seen today for follow-up.  Diagnoses and all orders for this visit:  Major depression, recurrent, full remission ([place])  GAD (generalized anxiety disorder)    History of extremely severe TR depression and anxiety with multiple med failures.  Doing well for 2-3 years now.  Encourage acceptance of taking the med and going out with life.    Cont meds.   Bad idea to stop meds.   Strongly rec cont meds DT difficulty getting this last episode under control.  Disc SE.Marland Kitchen     Still works out at gym.  Disc pricing L-methylfolate with GoodRx.  Parkiinson's is better with meds.  No med changes.   FU 6 mos.  Lynder Parents, MD, DFAPA   Please see After Visit Summary for patient specific instructions.  Future Appointments  Date Time Provider [place]  09/12/2019  8:00 AM [place] [facility]  09/19/2019  9:00 AM Olin Hauser, DO [place] [facility]  11/21/2019  1:20 PM [place] ADVISOR [facility]  05/15/2021  8:30 AM [facility] None  05/15/2021  9:30 AM Schnier, Dolores Lory, MD [facility] None    No orders of the defined types were placed in this encounter.   -------------------------------

## 2019-09-05 ENCOUNTER — Other Ambulatory Visit: Payer: Self-pay

## 2019-09-05 ENCOUNTER — Encounter: Payer: Self-pay | Admitting: Family Medicine

## 2019-09-05 ENCOUNTER — Other Ambulatory Visit: Payer: Medicare PPO

## 2019-09-05 ENCOUNTER — Ambulatory Visit: Payer: Medicare PPO

## 2019-09-05 DIAGNOSIS — E559 Vitamin D deficiency, unspecified: Secondary | ICD-10-CM

## 2019-09-05 DIAGNOSIS — G20 Parkinson's disease: Secondary | ICD-10-CM

## 2019-09-05 DIAGNOSIS — Z0000 Encounter for general adult medical examination without abnormal findings: Secondary | ICD-10-CM

## 2019-09-05 DIAGNOSIS — N400 Enlarged prostate without lower urinary tract symptoms: Secondary | ICD-10-CM

## 2019-09-07 ENCOUNTER — Other Ambulatory Visit: Payer: Self-pay

## 2019-09-07 ENCOUNTER — Encounter: Payer: Self-pay | Admitting: Family Medicine

## 2019-09-07 ENCOUNTER — Ambulatory Visit (INDEPENDENT_AMBULATORY_CARE_PROVIDER_SITE_OTHER): Payer: Medicare PPO | Admitting: Family Medicine

## 2019-09-07 VITALS — BP 90/62 | HR 56 | Temp 97.3°F | Resp 16 | Ht 68.0 in | Wt 155.7 lb

## 2019-09-07 DIAGNOSIS — Z0000 Encounter for general adult medical examination without abnormal findings: Secondary | ICD-10-CM | POA: Diagnosis not present

## 2019-09-07 DIAGNOSIS — I6523 Occlusion and stenosis of bilateral carotid arteries: Secondary | ICD-10-CM

## 2019-09-07 DIAGNOSIS — G20 Parkinson's disease: Secondary | ICD-10-CM

## 2019-09-07 DIAGNOSIS — E538 Deficiency of other specified B group vitamins: Secondary | ICD-10-CM | POA: Diagnosis not present

## 2019-09-07 DIAGNOSIS — N400 Enlarged prostate without lower urinary tract symptoms: Secondary | ICD-10-CM | POA: Diagnosis not present

## 2019-09-07 DIAGNOSIS — I739 Peripheral vascular disease, unspecified: Secondary | ICD-10-CM | POA: Diagnosis not present

## 2019-09-07 DIAGNOSIS — F3342 Major depressive disorder, recurrent, in full remission: Secondary | ICD-10-CM

## 2019-09-07 DIAGNOSIS — E559 Vitamin D deficiency, unspecified: Secondary | ICD-10-CM | POA: Diagnosis not present

## 2019-09-07 DIAGNOSIS — R7309 Other abnormal glucose: Secondary | ICD-10-CM

## 2019-09-07 NOTE — Assessment & Plan Note (Signed)
Stable, chronic problem w/ tremors primarily Followed by [facility] Neurology Dr Kalman Shan On carbidopa-levodopa, mirapex with improvement Continues to exercise and remain active Follow-up as planned

## 2019-09-07 NOTE — Patient Instructions (Addendum)
Thank you for coming to the office today.  Labs today, should be covered.  Keep on track. If we find cause of fatigue on blood work we can treat it.   Please schedule a Follow-up Appointment to: Return in about 6 months (around 03/09/2020) for 6 months follow-up.  If you have any other questions or concerns, please feel free to call the office or send a message through [place]. You may also schedule an earlier appointment if necessary.  Additionally, you may be receiving a survey about your experience at our office within a few days to 1 week by e-mail or mail. We value your feedback.  Nobie Putnam, DO [place]

## 2019-09-07 NOTE — Assessment & Plan Note (Signed)
Stable chronic BPH without significant symptoms on Saw Palmetto - Last PSA stable at 1.5 - due today for lab - Last DRE 09/03/17 - mild enlarged smooth - No known personal/family history of prostate CA  Plan: Continue supplement Saw Palmetto Check PSA Follow-up if worsening

## 2019-09-07 NOTE — Assessment & Plan Note (Addendum)
Followed by Psychiatry In remission Continue SSRI Paxil therapy

## 2019-09-07 NOTE — Assessment & Plan Note (Signed)
Followed by Vascular Specialist PAD, mild carotid stenosis Will check lipid panel screening Due for upcoming carotid duplex from vascular

## 2019-09-07 NOTE — Progress Notes (Signed)
Subjective:    Patient ID: Joseph Arroyo, male    DOB: 1941/02/03, 79 y.o.   MRN: [id number]  Joseph Arroyo is a 79 y.o. male presenting on 09/07/2019 for Annual Exam   HPI   Here for Annual Physical and Due for fasting labs   Parkinson's Disease / Tremors Followed by Dr Nicki Reaper Demetra Shiner ([place]), last seen 07/2019, prior diagnosis is for tremor-dominant PD. He continues on current medication with carvidopa-levodopa and mirapex  Lifestyle He exercises regularly at gym 3 x weekly, will do stretching as well He is working on improving home PT exercises Takes multiple daily supplementsper current meds list  Bilateral Carotid Artery Stenosis / PAD Followed by Vascular specialist in [place]. Prior imaging showed mild < 39% narrowing bilateral arteries, has upcoming carotid duplex Due for lipid panel for monitoring.  Major Depression recurrent full remission He is currently followed by Dr Lynder [facility]) He takes anti-depressant Paxil 60mg  regularly (40mg  + 20mg  pill)  Fatigue Last visit reviewed background, labs unremarkable, with normal Vitamin D, TSH, CBC Clinically no other obvious symptoms at this time.  BPH without significant LUTS No new concerns. Last PSA screening recently normal He takes [employer] with improvement in LUTS, now has minimal symptoms  PMH Back Pain, Knee Pain, Osteoarthritis - Ortho Dr Marry Guan Last imaging MRI 04/2017 showed suspected partial tear in multiple locations   Health Maintenance:  Prostate CA Screening: Prior PSA / DRE reported normal. Last PSA1.5(08/2018). Currently asymptomatic.No known family history of prostate CA.  Colon CA Screening - previous colonoscopy negative. Now last updated Cologuard 07/2016 negative   Depression screen [facility] 2/9 09/07/2019 03/07/2019 09/06/2018  Decreased Interest 0 0 0  Down, Depressed, Hopeless 0 0 0  PHQ - 2 Score 0 0 0  Altered  sleeping 0 - 0  Tired, decreased energy 1 - 0  Change in appetite 0 - 0  Feeling bad or failure about yourself  0 - 0  Trouble concentrating 0 - 0  Moving slowly or fidgety/restless 0 - 0  Suicidal thoughts 0 - 0  PHQ-9 Score 1 - 0  Difficult doing work/chores Not difficult at all - Not difficult at all    Past Medical History:  Diagnosis Date  . Allergic rhinitis due to pollen 11/21/2007  . Brachial neuritis or radiculitis NOS   . Cervicalgia   . Concussion    age 62 - s/p accident  . Costal chondritis   . Depression   . Essential and other specified forms of tremor   . GERD (gastroesophageal reflux disease)    in past  . Lumbago   . Occlusion and stenosis of carotid artery without mention of cerebral infarction   . Seizures [facility])    age 51 - after concussion   Past Surgical History:  Procedure Laterality Date  . CATARACT EXTRACTION Right 07/18/14  . CATARACT EXTRACTION W/PHACO Left 08/01/2014   Procedure: CATARACT EXTRACTION PHACO AND INTRAOCULAR LENS PLACEMENT (IOC);  Surgeon: Leandrew Koyanagi, MD;  Location: [place];  Service: Ophthalmology;  Laterality: Left;  IVA TOPICAL  . COLONOSCOPY  2008  . OTHER SURGICAL HISTORY  2002   ear surgery  . STAPEDES SURGERY Right 2002   Social History   Socioeconomic History  . Marital status: Married    Spouse name: Ladamien Rammel  . Number of children: 2  . Years of education: 56  . Highest education level: Master's degree (e.g., MA, MS, MEng, MEd,  MSW, MBA)  Occupational History  . 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 Smoking status: Never Smoker  . Smokeless tobacco: Never Used  Vaping Use  . Vaping Use: Never used  Substance and Sexual Activity  . Alcohol use: No    Alcohol/week: 0.0 standard drinks  . Drug use: No  . Sexual activity: Not Currently  Other Topics Concern  . Not on file  Social History Narrative   Married to [name], has 2 children, has  grandchildren   Right handed   Master's plus   He is born in [place], heritage is [place] (Father's side), [place] (Mother's side)      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    Social Determinants of Radio broadcast assistant Strain:   . Difficulty of Paying Living Expenses:   Food Insecurity:   . Worried About Charity fundraiser in the Last Year:   . Arboriculturist in the Last Year:   Transportation Needs:   . Film/video editor (Medical):   Marland Kitchen Lack of Transportation (Non-Medical):   Physical Activity:   . Days of Exercise per Week:   . Minutes of Exercise per Session:   Stress:   . Feeling of Stress :   Social Connections:   . Frequency of Communication with Friends and Family:   . Frequency of Social Gatherings with Friends and Family:   . Attends Religious Services:   . Active Member of Clubs or Organizations:   . Attends Archivist Meetings:   Marland Kitchen Marital Status:   Intimate Partner Violence:   . Fear of Current or Ex-Partner:   . Emotionally Abused:   Marland Kitchen Physically Abused:   . Sexually Abused:    Family History  Problem Relation Age of Onset  . Heart failure Mother        enlarged heart   . Asthma Father    Current Outpatient Medications on File Prior to Visit  Medication Sig  . B Complex Vitamins (VITAMIN B COMPLEX PO) Take 1 tablet by mouth daily.  . baclofen (LIORESAL) 10 MG tablet TAKE 0.5-1 TABLETS (5-10 MG TOTAL) BY MOUTH 2 (TWO) TIMES DAILY AS NEEDED FOR MUSCLE SPASMS.  . Capsicum-Garlic 542-706 MG CAPS Take 200-300 mg by mouth.  . carbidopa-levodopa (SINEMET IR) 25-100 MG tablet Take 2 tablets by mouth 3 (three) times daily.  . clotrimazole-betamethasone (LOTRISONE) cream Apply 1-2 times a day for worsening flare dry skin dermatitis of toes/feet, may re-use daily up to 1 week as needed.  . fluticasone (FLONASE) 50 MCG/ACT nasal spray SPRAY 2 SPRAYS INTO EACH NOSTRIL EVERY DAY  . gabapentin (NEURONTIN) 100 MG capsule TAKE 1 CAPSULE BY MOUTH  TWICE A DAY  . HAWTHORNE BERRY PO Take by mouth daily.  Marland Kitchen ibuprofen (ADVIL,MOTRIN) 200 MG tablet Take 200 mg by mouth every 6 (six) hours as needed for moderate pain.  Marland Kitchen L-Methylfolate 15 MG TABS Take 1 tablet (15 mg total) by mouth daily.  . Lutein 10 MG TABS Take 1 tablet by mouth 3 (three) times daily.  . meloxicam (MOBIC) 15 MG tablet TAKE 0.5-1 TABLETS (7.5-15 MG TOTAL) BY MOUTH DAILY AS NEEDED FOR PAIN.  Marland Kitchen omega-3 acid ethyl esters (LOVAZA) 1 g capsule Take 1 capsule by mouth daily.  Marland Kitchen PARoxetine (PAXIL) 20 MG tablet TAKE 1 TABLET BY MOUTH EVERY DAY (WITH THE 40 MG TABLET FOR 60 MG TOTAL)  . PARoxetine (PAXIL) 40 MG tablet TAKE 1  TABLET BY MOUTH EVERY DAY (WITH THE 20 MG TABLET FOR 60 MG TOTAL)  . pramipexole (MIRAPEX) 0.125 MG tablet Take 0.25 mg by mouth in the morning and at bedtime.   . PSYLLIUM HUSK PO Take by mouth.  . Saw Palmetto, Serenoa repens, 1000 MG CAPS Take 2 capsules by mouth daily.   No current facility-administered medications on file prior to visit.    Review of Systems  Constitutional: Negative for activity change, appetite change, chills, diaphoresis, fatigue and fever.  HENT: Negative for congestion and hearing loss.   Eyes: Negative for visual disturbance.  Respiratory: Negative for apnea, cough, chest tightness, shortness of breath and wheezing.   Cardiovascular: Negative for chest pain, palpitations and leg swelling.  Gastrointestinal: Negative for abdominal pain, anal bleeding, blood in stool, constipation, diarrhea, nausea and vomiting.  Endocrine: Negative for cold intolerance.  Genitourinary: Negative for difficulty urinating, dysuria, frequency and hematuria.  Musculoskeletal: Negative for arthralgias, back pain and neck pain.  Skin: Negative for rash.  Allergic/Immunologic: Negative for environmental allergies.  Neurological: Negative for dizziness, weakness, light-headedness, numbness and headaches.  Hematological: Negative for adenopathy.    Psychiatric/Behavioral: Negative for behavioral problems, dysphoric mood and sleep disturbance. The patient is not nervous/anxious.    Per HPI unless specifically indicated above      Objective:    BP 90/62   Pulse (!) 56   Temp (!) 97.3 F (36.3 C) (Temporal)   Resp 16   Ht 5\' 8"  (1.727 m)   Wt 155 lb 11.2 oz (70.6 kg)   SpO2 96%   BMI 23.67 kg/m   Wt Readings from Last 3 Encounters:  09/07/19 155 lb 11.2 oz (70.6 kg)  05/15/19 157 lb 9.6 oz (71.5 kg)  03/07/19 158 lb (71.7 kg)    Physical Exam Vitals and nursing note reviewed.  Constitutional:      General: He is not in acute distress.    Appearance: He is well-developed. He is not diaphoretic.     Comments: Well-appearing, comfortable, cooperative  HENT:     Head: Normocephalic and atraumatic.  Eyes:     General:        Right eye: No discharge.        Left eye: No discharge.     Conjunctiva/sclera: Conjunctivae normal.     Pupils: Pupils are equal, round, and reactive to light.  Neck:     Thyroid: No thyromegaly.     Vascular: No carotid bruit.  Cardiovascular:     Rate and Rhythm: Normal rate and regular rhythm.     Heart sounds: Normal heart sounds. No murmur heard.   Pulmonary:     Effort: Pulmonary effort is normal. No respiratory distress.     Breath sounds: Normal breath sounds. No wheezing or rales.  Abdominal:     General: Bowel sounds are normal. There is no distension.     Palpations: Abdomen is soft. There is no mass.     Tenderness: There is no abdominal tenderness.  Musculoskeletal:        General: No tenderness. Normal range of motion.     Cervical back: Normal range of motion and neck supple.     Comments: Upper / Lower Extremities: - Normal muscle tone, strength bilateral upper extremities 5/5, lower extremities 5/5  Lymphadenopathy:     Cervical: No cervical adenopathy.  Skin:    General: Skin is warm and dry.     Findings: No erythema or rash.  Neurological:  Mental Status: He is  alert and oriented to person, place, and time.     Comments: Distal sensation intact to light touch all extremities  Psychiatric:        Behavior: Behavior normal.     Comments: Well groomed, good eye contact, normal speech and thoughts       Results for orders placed or performed in visit on 09/01/18  TSH  Result Value Ref Range   TSH 1.50 0.40 - 4.50 mIU/L  VITAMIN D 25 Hydroxy (Vit-D Deficiency, Fractures)  Result Value Ref Range   Vit D, 25-Hydroxy 43 30 - 100 ng/mL  PSA  Result Value Ref Range   PSA 1.5 < OR = 4.0 ng/mL  Lipid panel  Result Value Ref Range   Cholesterol 127 <200 mg/dL   HDL 56 > OR = 40 mg/dL   Triglycerides 60 <150 mg/dL   LDL Cholesterol (Calc) 57 mg/dL (calc)   Total CHOL/HDL Ratio 2.3 <5.0 (calc)   Non-HDL Cholesterol (Calc) 71 <130 mg/dL (calc)  COMPLETE METABOLIC PANEL WITH GFR  Result Value Ref Range   Glucose, Bld 88 65 - 99 mg/dL   BUN 19 7 - 25 mg/dL   Creat 0.74 0.70 - 1.18 mg/dL   GFR, Est Non African American 90 > OR = 60 mL/min/1.46m2   GFR, Est African American 104 > OR = 60 mL/min/1.48m2   BUN/Creatinine Ratio NOT APPLICABLE 6 - 22 (calc)   Sodium 141 135 - 146 mmol/L   Potassium 4.0 3.5 - 5.3 mmol/L   Chloride 105 98 - 110 mmol/L   CO2 27 20 - 32 mmol/L   Calcium 9.0 8.6 - 10.3 mg/dL   Total Protein 6.6 6.1 - 8.1 g/dL   Albumin 4.2 3.6 - 5.1 g/dL   Globulin 2.4 1.9 - 3.7 g/dL (calc)   AG Ratio 1.8 1.0 - 2.5 (calc)   Total Bilirubin 0.5 0.2 - 1.2 mg/dL   Alkaline phosphatase (APISO) 59 35 - 144 U/L   AST 26 10 - 35 U/L   ALT 37 9 - 46 U/L  CBC with Differential/Platelet  Result Value Ref Range   WBC 5.5 3.8 - 10.8 Thousand/uL   RBC 5.14 4.20 - 5.80 Million/uL   Hemoglobin 15.4 13.2 - 17.1 g/dL   HCT 46.5 38 - 50 %   MCV 90.5 80.0 - 100.0 fL   MCH 30.0 27.0 - 33.0 pg   MCHC 33.1 32.0 - 36.0 g/dL   RDW 12.5 11.0 - 15.0 %   Platelets 170 140 - 400 Thousand/uL   MPV 10.0 7.5 - 12.5 fL   Neutro Abs 3,361 1,500 - 7,800  cells/uL   Lymphs Abs 1,315 850 - 3,900 cells/uL   Absolute Monocytes 292 200 - 950 cells/uL   Eosinophils Absolute 462 15 - 500 cells/uL   Basophils Absolute 72 0 - 200 cells/uL   Neutrophils Relative % 61.1 %   Total Lymphocyte 23.9 %   Monocytes Relative 5.3 %   Eosinophils Relative 8.4 %   Basophils Relative 1.3 %  Hemoglobin A1c  Result Value Ref Range   Hgb A1c MFr Bld 5.0 <5.7 % of total Hgb   Mean Plasma Glucose 97 (calc)   eAG (mmol/L) 5.4 (calc)      Assessment & Plan:   Problem List Items Addressed This Visit    Vitamin D insufficiency   Relevant Orders   VITAMIN D 25 Hydroxy (Vit-D Deficiency, Fractures)   Parkinson's disease (HCC)    Stable,  chronic problem w/ tremors primarily Followed by [facility] Neurology Dr Kalman Shan On carbidopa-levodopa, mirapex with improvement Continues to exercise and remain active Follow-up as planned      Relevant Orders   CBC with Differential/Platelet   COMPLETE METABOLIC PANEL WITH GFR   TSH   Major depression, recurrent, full remission ([place])    Followed by Psychiatry In remission Continue SSRI Paxil therapy      Relevant Orders   COMPLETE METABOLIC PANEL WITH GFR   BPH without obstruction/lower urinary tract symptoms    Stable chronic BPH without significant symptoms on Saw Palmetto - Last PSA stable at 1.5 - due today for lab - Last DRE 09/03/17 - mild enlarged smooth - No known personal/family history of prostate CA  Plan: Continue supplement Saw Palmetto Check PSA Follow-up if worsening      Relevant Orders   PSA   Bilateral carotid artery stenosis    Followed by Vascular Specialist PAD, mild carotid stenosis Will check lipid panel screening Due for upcoming carotid duplex from vascular      Relevant Orders   Lipid panel    Other Visit Diagnoses    Annual physical exam    -  Primary   Relevant Orders   Hemoglobin A1c   CBC with Differential/Platelet   COMPLETE METABOLIC PANEL WITH GFR   Lipid panel   PSA     TSH   Abnormal glucose       Relevant Orders   Hemoglobin A1c   Vitamin B12 deficiency       Relevant Orders   Vitamin B12   PAD (peripheral artery disease) (HCC)       Relevant Orders   Hemoglobin A1c   Lipid panel   TSH      Updated Health Maintenance information - Future offer repeat Cologuard if interested Ordered labs today - Check vitamin deficiency similar in past with some fatigue Will review results w/ patient once available Encouraged improvement to lifestyle with diet and exercise Maintain healthy weight.  No orders of the defined types were placed in this encounter.   Follow up plan: Return in about 6 months (around 03/09/2020) for 6 months follow-up.  Nobie Putnam, [facility] 09/07/2019, 9:11 AM

## 2019-09-08 LAB — COMPLETE METABOLIC PANEL WITH GFR
AG Ratio: 1.8 (calc) (ref 1.0–2.5)
ALT: 5 U/L — ABNORMAL LOW (ref 9–46)
AST: 19 U/L (ref 10–35)
Albumin: 4.4 g/dL (ref 3.6–5.1)
Alkaline phosphatase (APISO): 55 U/L (ref 35–144)
BUN: 19 mg/dL (ref 7–25)
CO2: 32 mmol/L (ref 20–32)
Calcium: 9.2 mg/dL (ref 8.6–10.3)
Chloride: 103 mmol/L (ref 98–110)
Creat: 0.8 mg/dL (ref 0.70–1.18)
GFR, Est African American: 100 mL/min/{1.73_m2} (ref >=60)
GFR, Est Non African American: 86 mL/min/{1.73_m2} (ref >=60)
Globulin: 2.5 g/dL (calc) (ref 1.9–3.7)
Glucose, Bld: 93 mg/dL (ref 65–99)
Potassium: 4.3 mmol/L (ref 3.5–5.3)
Sodium: 140 mmol/L (ref 135–146)
Total Bilirubin: 0.8 mg/dL (ref 0.2–1.2)
Total Protein: 6.9 g/dL (ref 6.1–8.1)

## 2019-09-08 LAB — CBC WITH DIFFERENTIAL/PLATELET
Absolute Monocytes: 320 cells/uL (ref 200–950)
Basophils Absolute: 80 cells/uL (ref 0–200)
Basophils Relative: 1.7 %
Eosinophils Absolute: 409 cells/uL (ref 15–500)
Eosinophils Relative: 8.7 %
HCT: 45.5 % (ref 38.5–50.0)
Hemoglobin: 15.2 g/dL (ref 13.2–17.1)
Lymphs Abs: 1170 cells/uL (ref 850–3900)
MCH: 30.8 pg (ref 27.0–33.0)
MCHC: 33.4 g/dL (ref 32.0–36.0)
MCV: 92.3 fL (ref 80.0–100.0)
MPV: 10.5 fL (ref 7.5–12.5)
Monocytes Relative: 6.8 %
Neutro Abs: 2721 cells/uL (ref 1500–7800)
Neutrophils Relative %: 57.9 %
Platelets: 163 10*3/uL (ref 140–400)
RBC: 4.93 10*6/uL (ref 4.20–5.80)
RDW: 12.4 % (ref 11.0–15.0)
Total Lymphocyte: 24.9 %
WBC: 4.7 10*3/uL (ref 3.8–10.8)

## 2019-09-08 LAB — HEMOGLOBIN A1C
Hgb A1c MFr Bld: 5 % of total Hgb (ref <5.7)
Mean Plasma Glucose: 97 (calc)
eAG (mmol/L): 5.4 (calc)

## 2019-09-08 LAB — LIPID PANEL
Cholesterol: 143 mg/dL (ref <200)
HDL: 54 mg/dL (ref >=40)
LDL Cholesterol (Calc): 76 mg/dL (calc)
Non-HDL Cholesterol (Calc): 89 mg/dL (calc) (ref <130)
Total CHOL/HDL Ratio: 2.6 (calc) (ref <5.0)
Triglycerides: 53 mg/dL (ref <150)

## 2019-09-08 LAB — VITAMIN D 25 HYDROXY (VIT D DEFICIENCY, FRACTURES): Vit D, 25-Hydroxy: 35 ng/mL (ref 30–100)

## 2019-09-08 LAB — VITAMIN B12: Vitamin B-12: 612 pg/mL (ref 200–1100)

## 2019-09-08 LAB — PSA: PSA: 1.4 ng/mL (ref <=4.0)

## 2019-09-08 LAB — TSH: TSH: 1.59 mIU/L (ref 0.40–4.50)

## 2019-09-12 ENCOUNTER — Other Ambulatory Visit: Payer: Medicare Other

## 2019-09-19 ENCOUNTER — Ambulatory Visit (INDEPENDENT_AMBULATORY_CARE_PROVIDER_SITE_OTHER): Payer: Medicare PPO

## 2019-09-19 ENCOUNTER — Ambulatory Visit: Payer: Medicare Other

## 2019-09-19 ENCOUNTER — Encounter: Payer: Medicare Other | Admitting: Family Medicine

## 2019-09-19 VITALS — Ht 67.0 in | Wt 155.0 lb

## 2019-09-19 DIAGNOSIS — Z0000 Encounter for general adult medical examination without abnormal findings: Secondary | ICD-10-CM

## 2019-09-19 NOTE — Patient Instructions (Signed)
Joseph Arroyo , Thank you for taking time to come for your Medicare Wellness Visit. I appreciate your ongoing commitment to your health goals. Please review the following plan we discussed and let me know if I can assist you in the future.   Screening recommendations/referrals: Colonoscopy: not required Recommended yearly ophthalmology/optometry visit for glaucoma screening and checkup Recommended yearly dental visit for hygiene and checkup  Vaccinations: Influenza vaccine: decline Pneumococcal vaccine: completed 07/13/2016 Tdap vaccine: completed 03/02/2010, due 03/02/2020 Shingles vaccine: discussed   Covid-19: decline  Advanced directives: Please bring a copy of your POA (Power of Attorney) and/or Living Will to your next appointment.   Conditions/risks identified: none  Next appointment: Follow up in one year for your annual wellness visit.   Preventive Care 78 Years and Older, Male Preventive care refers to lifestyle choices and visits with your health care provider that can promote health and wellness. What does preventive care include?  A yearly physical exam. This is also called an annual well check.  Dental exams once or twice a year.  Routine eye exams. Ask your health care provider how often you should have your eyes checked.  Personal lifestyle choices, including:  Daily care of your teeth and gums.  Regular physical activity.  Eating a healthy diet.  Avoiding tobacco and drug use.  Limiting alcohol use.  Practicing safe sex.  Taking low doses of aspirin every day.  Taking vitamin and mineral supplements as recommended by your health care provider. What happens during an annual well check? The services and screenings done by your health care provider during your annual well check will depend on your age, overall health, lifestyle risk factors, and family history of disease. Counseling  Your health care provider may ask you questions about your:  Alcohol  use.  Tobacco use.  Drug use.  Emotional well-being.  Home and relationship well-being.  Sexual activity.  Eating habits.  History of falls.  Memory and ability to understand (cognition).  Work and work Statistician. Screening  You may have the following tests or measurements:  Height, weight, and BMI.  Blood pressure.  Lipid and cholesterol levels. These may be checked every 5 years, or more frequently if you are over 29 years old.  Skin check.  Lung cancer screening. You may have this screening every year starting at age 59 if you have a 30-pack-year history of smoking and currently smoke or have quit within the past 15 years.  Fecal occult blood test (FOBT) of the stool. You may have this test every year starting at age 29.  Flexible sigmoidoscopy or colonoscopy. You may have a sigmoidoscopy every 5 years or a colonoscopy every 10 years starting at age 56.  Prostate cancer screening. Recommendations will vary depending on your family history and other risks.  Hepatitis C blood test.  Hepatitis B blood test.  Sexually transmitted disease (STD) testing.  Diabetes screening. This is done by checking your blood sugar (glucose) after you have not eaten for a while (fasting). You may have this done every 1-3 years.  Abdominal aortic aneurysm (AAA) screening. You may need this if you are a current or former smoker.  Osteoporosis. You may be screened starting at age 30 if you are at high risk. Talk with your health care provider about your test results, treatment options, and if necessary, the need for more tests. Vaccines  Your health care provider may recommend certain vaccines, such as:  Influenza vaccine. This is recommended every year.  Tetanus,  diphtheria, and acellular pertussis (Tdap, Td) vaccine. You may need a Td booster every 10 years.  Zoster vaccine. You may need this after age 61.  Pneumococcal 13-valent conjugate (PCV13) vaccine. One dose is  recommended after age 48.  Pneumococcal polysaccharide (PPSV23) vaccine. One dose is recommended after age 51. Talk to your health care provider about which screenings and vaccines you need and how often you need them. This information is not intended to replace advice given to you by your health care provider. Make sure you discuss any questions you have with your health care provider. Document Released: 03/15/2015 Document Revised: 11/06/2015 Document Reviewed: 12/18/2014 Elsevier Interactive Patient Education  2017 [place] Prevention in the Home Falls can cause injuries. They can happen to people of all ages. There are many things you can do to make your home safe and to help prevent falls. What can I do on the outside of my home?  Regularly fix the edges of walkways and driveways and fix any cracks.  Remove anything that might make you trip as you walk through a door, such as a raised step or threshold.  Trim any bushes or trees on the path to your home.  Use bright outdoor lighting.  Clear any walking paths of anything that might make someone trip, such as rocks or tools.  Regularly check to see if handrails are loose or broken. Make sure that both sides of any steps have handrails.  Any raised decks and porches should have guardrails on the edges.  Have any leaves, snow, or ice cleared regularly.  Use sand or salt on walking paths during winter.  Clean up any spills in your garage right away. This includes oil or grease spills. What can I do in the bathroom?  Use night lights.  Install grab bars by the toilet and in the tub and shower. Do not use towel bars as grab bars.  Use non-skid mats or decals in the tub or shower.  If you need to sit down in the shower, use a plastic, non-slip stool.  Keep the floor dry. Clean up any water that spills on the floor as soon as it happens.  Remove soap buildup in the tub or shower regularly.  Attach bath mats  securely with double-sided non-slip rug tape.  Do not have throw rugs and other things on the floor that can make you trip. What can I do in the bedroom?  Use night lights.  Make sure that you have a light by your bed that is easy to reach.  Do not use any sheets or blankets that are too big for your bed. They should not hang down onto the floor.  Have a firm chair that has side arms. You can use this for support while you get dressed.  Do not have throw rugs and other things on the floor that can make you trip. What can I do in the kitchen?  Clean up any spills right away.  Avoid walking on wet floors.  Keep items that you use a lot in easy-to-reach places.  If you need to reach something above you, use a strong step stool that has a grab bar.  Keep electrical cords out of the way.  Do not use floor polish or wax that makes floors slippery. If you must use wax, use non-skid floor wax.  Do not have throw rugs and other things on the floor that can make you trip. What can I do with  my stairs?  Do not leave any items on the stairs.  Make sure that there are handrails on both sides of the stairs and use them. Fix handrails that are broken or loose. Make sure that handrails are as long as the stairways.  Check any carpeting to make sure that it is firmly attached to the stairs. Fix any carpet that is loose or worn.  Avoid having throw rugs at the top or bottom of the stairs. If you do have throw rugs, attach them to the floor with carpet tape.  Make sure that you have a light switch at the top of the stairs and the bottom of the stairs. If you do not have them, ask someone to add them for you. What else can I do to help prevent falls?  Wear shoes that:  Do not have high heels.  Have rubber bottoms.  Are comfortable and fit you well.  Are closed at the toe. Do not wear sandals.  If you use a stepladder:  Make sure that it is fully opened. Do not climb a closed  stepladder.  Make sure that both sides of the stepladder are locked into place.  Ask someone to hold it for you, if possible.  Clearly mark and make sure that you can see:  Any grab bars or handrails.  First and last steps.  Where the edge of each step is.  Use tools that help you move around (mobility aids) if they are needed. These include:  Canes.  Walkers.  Scooters.  Crutches.  Turn on the lights when you go into a dark area. Replace any light bulbs as soon as they burn out.  Set up your furniture so you have a clear path. Avoid moving your furniture around.  If any of your floors are uneven, fix them.  If there are any pets around you, be aware of where they are.  Review your medicines with your doctor. Some medicines can make you feel dizzy. This can increase your chance of falling. Ask your doctor what other things that you can do to help prevent falls. This information is not intended to replace advice given to you by your health care provider. Make sure you discuss any questions you have with your health care provider. Document Released: 12/13/2008 Document Revised: 07/25/2015 Document Reviewed: 03/23/2014 Elsevier Interactive Patient Education  2017 [employer].

## 2019-09-19 NOTE — Progress Notes (Signed)
I connected with Joseph Arroyo today by telephone and verified that I am speaking with the correct person using two identifiers. Location patient: home Location provider: work Persons participating in the virtual visit: Darean, Rote LPN.   I discussed the limitations, risks, security and privacy concerns of performing an evaluation and management service by telephone and the availability of in person appointments. I also discussed with the patient that there may be a patient responsible charge related to this service. The patient expressed understanding and verbally consented to this telephonic visit.    Interactive audio and video telecommunications were attempted between this provider and patient, however failed, due to patient having technical difficulties OR patient did not have access to video capability.  We continued and completed visit with audio only.  Vital signs may be patient reported or missing.     Subjective:   Joseph Arroyo is a 79 y.o. male who presents for Medicare Annual/Subsequent preventive examination.  Review of Systems     Cardiac Risk Factors include: advanced age (>57men, >44 women);male gender     Objective:    Today's Vitals   09/19/19 1406  Weight: 155 lb (70.3 kg)  Height: 5\' 7"  (1.702 m)   Body mass index is 24.28 kg/m.  Advanced Directives 09/19/2019 09/06/2018 08/24/2017 07/24/2016 07/13/2016 03/05/2016 10/03/2015  Does Patient Have a Medical Advance Directive? Yes Yes Yes Yes Yes No Yes  Type of Paramedic of [place];Living will Living will;Healthcare Power of [place];Living will [place];Living will [place];Living will - Living will;Healthcare Power of Attorney  Does patient want to make changes to medical advance directive? - - - - - - No - Patient declined  Copy of [place] in Chart? No - copy requested No -  copy requested No - copy requested No - copy requested No - copy requested - No - copy requested    Current Medications (verified) Outpatient Encounter Medications as of 09/19/2019  Medication Sig  . B Complex Vitamins (VITAMIN B COMPLEX PO) Take 1 tablet by mouth daily.  . Capsicum-Garlic 270-623 MG CAPS Take 200-300 mg by mouth.  . carbidopa-levodopa (SINEMET IR) 25-100 MG tablet Take 2 tablets by mouth 3 (three) times daily.  . clotrimazole-betamethasone (LOTRISONE) cream Apply 1-2 times a day for worsening flare dry skin dermatitis of toes/feet, may re-use daily up to 1 week as needed.  . fluticasone (FLONASE) 50 MCG/ACT nasal spray SPRAY 2 SPRAYS INTO EACH NOSTRIL EVERY DAY  . gabapentin (NEURONTIN) 100 MG capsule TAKE 1 CAPSULE BY MOUTH TWICE A DAY  . HAWTHORNE BERRY PO Take by mouth daily.  Marland Kitchen ibuprofen (ADVIL,MOTRIN) 200 MG tablet Take 200 mg by mouth every 6 (six) hours as needed for moderate pain.  Marland Kitchen L-Methylfolate 15 MG TABS Take 1 tablet (15 mg total) by mouth daily.  . Lutein 10 MG TABS Take 1 tablet by mouth 3 (three) times daily.  . meloxicam (MOBIC) 15 MG tablet TAKE 0.5-1 TABLETS (7.5-15 MG TOTAL) BY MOUTH DAILY AS NEEDED FOR PAIN.  Marland Kitchen omega-3 acid ethyl esters (LOVAZA) 1 g capsule Take 1 capsule by mouth daily.  Marland Kitchen PARoxetine (PAXIL) 20 MG tablet TAKE 1 TABLET BY MOUTH EVERY DAY (WITH THE 40 MG TABLET FOR 60 MG TOTAL)  . PARoxetine (PAXIL) 40 MG tablet TAKE 1 TABLET BY MOUTH EVERY DAY (WITH THE 20 MG TABLET FOR 60 MG TOTAL)  . pramipexole (MIRAPEX) 0.125 MG tablet  Take 0.25 mg by mouth in the morning and at bedtime.   . PSYLLIUM HUSK PO Take by mouth.  . Saw Palmetto, Serenoa repens, 1000 MG CAPS Take 2 capsules by mouth daily.  . baclofen (LIORESAL) 10 MG tablet TAKE 0.5-1 TABLETS (5-10 MG TOTAL) BY MOUTH 2 (TWO) TIMES DAILY AS NEEDED FOR MUSCLE SPASMS. (Patient not taking: Reported on 09/19/2019)   No facility-administered encounter medications on file as of 09/19/2019.     Allergies (verified) Prednisone and Wheat bran   History: Past Medical History:  Diagnosis Date  . Allergic rhinitis due to pollen 11/21/2007  . Brachial neuritis or radiculitis NOS   . Cervicalgia   . Concussion    age 25 - s/p accident  . Costal chondritis   . Depression   . Essential and other specified forms of tremor   . GERD (gastroesophageal reflux disease)    in past  . Lumbago   . Occlusion and stenosis of carotid artery without mention of cerebral infarction   . Seizures [facility])    age 47 - after concussion   Past Surgical History:  Procedure Laterality Date  . CATARACT EXTRACTION Right 07/18/14  . CATARACT EXTRACTION W/PHACO Left 08/01/2014   Procedure: CATARACT EXTRACTION PHACO AND INTRAOCULAR LENS PLACEMENT (IOC);  Surgeon: Leandrew Koyanagi, MD;  Location: [facility];  Service: Ophthalmology;  Laterality: Left;  IVA TOPICAL  . COLONOSCOPY  2008  . OTHER SURGICAL HISTORY  2002   ear surgery  . STAPEDES SURGERY Right 2002   Family History  Problem Relation Age of Onset  . Heart failure Mother        enlarged heart   . Asthma Father    Social History   Socioeconomic History  . Marital status: Married    Spouse name: Aengus Sauceda  . Number of children: 2  . Years of education: 9  . Highest education level: Master's degree (e.g., MA, MS, MEng, MEd, MSW, MBA)  Occupational History  . 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 Smoking status: Never Smoker  . Smokeless tobacco: Never Used  Vaping Use  . Vaping Use: Never used  Substance and Sexual Activity  . Alcohol use: No    Alcohol/week: 0.0 standard drinks  . Drug use: No  . Sexual activity: Not Currently  Other Topics Concern  . Not on file  Social History Narrative   Married to [name], has 2 children, has grandchildren   Right handed   Master's plus   He is born in [place], heritage is [place] (Father's side), [place] (Mother's side)       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    Social Determinants of Radio broadcast assistant Strain: Low Risk   . Difficulty of Paying Living Expenses: Not hard at all  Food Insecurity: No Food Insecurity  . Worried About Charity fundraiser in the Last Year: Never true  . Ran Out of Food in the Last Year: Never true  Transportation Needs: No Transportation Needs  . Lack of Transportation (Medical): No  . Lack of Transportation (Non-Medical): No  Physical Activity: Sufficiently Active  . Days of Exercise per Week: 3 days  . Minutes of Exercise per Session: 90 min  Stress: No Stress Concern Present  . Feeling of Stress : Not at all  Social Connections:   . Frequency of Communication with Friends and Family:   . Frequency of Social Gatherings with Friends and Family:   .  Attends Religious Services:   . Active Member of Clubs or Organizations:   . Attends Archivist Meetings:   Marland Kitchen Marital Status:     Tobacco Counseling Counseling given: Not Answered   Clinical Intake:  Pre-visit preparation completed: Yes  Pain : No/denies pain     Nutritional Status: BMI of 19-24  Normal Nutritional Risks: None Diabetes: No  How often do you need to have someone help you when you read instructions, pamphlets, or other written materials from your doctor or pharmacy?: 1 - Never What is the last grade level you completed in school?: master's degree  Diabetic? no  Interpreter Needed?: No  Information entered by :: NAllen LPN   Activities of Daily Living In your present state of health, do you have any difficulty performing the following activities: 09/19/2019 09/07/2019  Hearing? N N  Comment not as acute -  Vision? N N  Difficulty concentrating or making decisions? N N  Walking or climbing stairs? N N  Dressing or bathing? N N  Doing errands, shopping? N N  Preparing Food and eating ? N -  Using the Toilet? N -  In the past six months, have you accidently leaked urine? Y -    Comment once or twice at night -  Do you have problems with loss of bowel control? N -  Managing your Medications? N -  Managing your Finances? N -  Housekeeping or managing your Housekeeping? N -  Some recent data might be hidden    Patient Care Team: Olin Hauser, DO as PCP - General (Family Medicine) Christene Lye, MD (General Surgery) Star Age, MD as Attending Physician (Neurology) [name], PA-C as Physician Assistant (Physician Assistant) Kem Parkinson, MD as Consulting Physician (Ophthalmology)  Indicate any recent Medical Services you may have received from other than [facility] providers in the past year (date may be approximate).     Assessment:   This is a routine wellness examination for Joseph Arroyo.  Hearing/Vision screen  Hearing Screening   125Hz  250Hz  500Hz  1000Hz  2000Hz  3000Hz  4000Hz  6000Hz  8000Hz   Right ear:           Left ear:           Vision Screening Comments: Regular eye exams, Dr Francene Boyers, [facility]  Dietary issues and exercise activities discussed: Current Exercise Habits: Home exercise routine, Type of exercise: strength training/weights;Other - see comments (cardio), Time (Minutes): > 60, Frequency (Times/Week): 3, Weekly Exercise (Minutes/Week): 0  Goals    . DIET - INCREASE WATER INTAKE     Recommend drinking at least 6-8 glasses of water a day     . Increase water intake     Recommend increasing water intake to 4-6 glasses of water a day.    . Patient Stated     09/19/2019, continue to stay fit      Depression Screen PHQ 2/9 Scores 09/19/2019 09/07/2019 03/07/2019 09/06/2018 09/06/2018 01/05/2018 09/03/2017  PHQ - 2 Score 0 0 0 0 0 0 0  PHQ- 9 Score 1 1 - 0 - - 0    Fall Risk Fall Risk  09/19/2019 09/07/2019 03/07/2019 09/06/2018 01/05/2018  Falls in the past year? 1 0 1 0 0  Comment stepped on a bee - - - -  Number falls in past yr: - 0 0 - -  Comment - - - - -  Injury with Fall? - 0 1 - -  Risk for fall due to :  History  of fall(s);Medication side effect - - - -  Follow up Falls evaluation completed;Education provided;Falls prevention discussed Falls evaluation completed Falls evaluation completed - -    Any stairs in or around the home? Yes  If so, are there any without handrails? Yes  Home free of loose throw rugs in walkways, pet beds, electrical cords, etc? Yes  Adequate lighting in your home to reduce risk of falls? Yes   ASSISTIVE DEVICES UTILIZED TO PREVENT FALLS:  Life alert? No  Use of a cane, walker or w/c? No  Grab bars in the bathroom? Yes  Shower chair or bench in shower? No  Elevated toilet seat or a handicapped toilet? Yes   TIMED UP AND GO:  Was the test performed? No .  Cognitive Function:     6CIT Screen 09/19/2019 09/06/2018 08/24/2017 07/13/2016  What Year? 0 points 0 points 0 points 0 points  What month? 0 points 0 points 0 points 0 points  What time? 0 points 0 points 0 points 0 points  Count back from 20 0 points 0 points 0 points 0 points  Months in reverse 0 points 0 points 0 points 0 points  Repeat phrase 0 points 2 points 0 points 2 points  Total Score 0 2 0 2    Immunizations Immunization History  Administered Date(s) Administered  . Pneumococcal Conjugate-13 07/13/2016  . Pneumococcal Polysaccharide-23 01/26/2011    TDAP status: Up to date Flu Vaccine status: Declined, Education has been provided regarding the importance of this vaccine but patient still declined. Advised may receive this vaccine at local pharmacy or Health Dept. Aware to provide a copy of the vaccination record if obtained from local pharmacy or Health Dept. Verbalized acceptance and understanding. Pneumococcal vaccine status: Up to date Covid-19 vaccine status: Declined, Education has been provided regarding the importance of this vaccine but patient still declined. Advised may receive this vaccine at local pharmacy or Health Dept.or vaccine clinic. Aware to provide a copy of the  vaccination record if obtained from local pharmacy or Health Dept. Verbalized acceptance and understanding.  Qualifies for Shingles Vaccine? Yes   Zostavax completed No   Shingrix Completed?: No.    Education has been provided regarding the importance of this vaccine. Patient has been advised to call insurance company to determine out of pocket expense if they have not yet received this vaccine. Advised may also receive vaccine at local pharmacy or Health Dept. Verbalized acceptance and understanding.  Screening Tests Health Maintenance  Topic Date Due  . Hepatitis C Screening  Never done  . COVID-19 Vaccine (1) Never done  . INFLUENZA VACCINE  10/01/2019  . TETANUS/TDAP  03/02/2020  . PNA vac Low Risk Adult  Completed    Health Maintenance  Health Maintenance Due  Topic Date Due  . Hepatitis C Screening  Never done  . COVID-19 Vaccine (1) Never done    Colorectal cancer screening: No longer required.   Lung Cancer Screening: (Low Dose CT Chest recommended if Age 70-80 years, 30 pack-year currently smoking OR have quit w/in 15years.) does not qualify.   Lung Cancer Screening Referral: no  Additional Screening:  Hepatitis C Screening: does qualify;   Vision Screening: Recommended annual ophthalmology exams for early detection of glaucoma and other disorders of the eye. Is the patient up to date with their annual eye exam?  Yes  Who is the provider or what is the name of the office in which the patient attends annual eye exams? [facility] If pt is not established with a provider, would they like to be referred to a provider to establish care? No .   Dental Screening: Recommended annual dental exams for proper oral hygiene  Community Resource Referral / Chronic Care Management: CRR required this visit?  No   CCM required this visit?  No      Plan:     I have personally reviewed and noted the following in the patient's chart:   . Medical and social  history . Use of alcohol, tobacco or illicit drugs  . Current medications and supplements . Functional ability and status . Nutritional status . Physical activity . Advanced directives . List of other physicians . Hospitalizations, surgeries, and ER visits in previous 12 months . Vitals . Screenings to include cognitive, depression, and falls . Referrals and appointments  In addition, I have reviewed and discussed with patient certain preventive protocols, quality metrics, and best practice recommendations. A written personalized care plan for preventive services as well as general preventive health recommendations were provided to patient.     Kellie Simmering, LPN   [id number]/3361   Nurse Notes: Patient not interested in covid vaccine.

## 2019-09-22 ENCOUNTER — Telehealth: Payer: Self-pay

## 2019-09-22 DIAGNOSIS — Z1211 Encounter for screening for malignant neoplasm of colon: Secondary | ICD-10-CM

## 2019-09-22 NOTE — Telephone Encounter (Signed)
Copied from [place] [id number]. Topic: General - Inquiry >> Sep 22, 2019 10:16 AM Greggory Keen D wrote: Reason for CRM: Pt called saying he received a letter saying it is time to do his Cologuard test again.  He wanted to ask Dr, Raliegh Ip about it.  CB#  [phone]

## 2019-09-22 NOTE — Telephone Encounter (Signed)
The last cologuard was 2018 --ordered for Dr Raliegh Ip to sign and patient notified for direction and receiving package in the mail.

## 2019-10-10 ENCOUNTER — Telehealth: Payer: Self-pay | Admitting: Family Medicine

## 2019-10-10 NOTE — Telephone Encounter (Signed)
Pls reach out to Joseph Arroyo if at all possible by EOD. Joseph Arroyo says it has been a long time since ask a favor like this, is feeling very bad, Came down with a fever last nite, has congestion, coughing all in his chest and his head is very stopped up. He has been having coughing spells. His fever right now is 99.5. He feels it is imperative that he gets meds as soon as possible. Wants a call back if possible from Dr Raliegh Ip. Lemmie Evens  [phone]

## 2019-10-11 ENCOUNTER — Telehealth (INDEPENDENT_AMBULATORY_CARE_PROVIDER_SITE_OTHER): Payer: Medicare PPO | Admitting: Family Medicine

## 2019-10-11 ENCOUNTER — Encounter: Payer: Self-pay | Admitting: Family Medicine

## 2019-10-11 ENCOUNTER — Other Ambulatory Visit: Payer: Self-pay

## 2019-10-11 VITALS — Temp 99.5°F

## 2019-10-11 DIAGNOSIS — U071 COVID-19: Secondary | ICD-10-CM | POA: Diagnosis not present

## 2019-10-11 DIAGNOSIS — R05 Cough: Secondary | ICD-10-CM

## 2019-10-11 DIAGNOSIS — Z20822 Contact with and (suspected) exposure to covid-19: Secondary | ICD-10-CM | POA: Diagnosis not present

## 2019-10-11 DIAGNOSIS — R058 Other specified cough: Secondary | ICD-10-CM

## 2019-10-11 LAB — NOVEL CORONAVIRUS, NAA: SARS-CoV-2, NAA: POSITIVE

## 2019-10-11 MED ORDER — IPRATROPIUM BROMIDE 0.06 % NA SOLN: 2.0000 | Freq: Four times a day (QID) | NASAL | 0 refills | Status: DC

## 2019-10-11 MED ORDER — BENZONATATE 100 MG PO CAPS: 100.0000 mg | ORAL_CAPSULE | Freq: Three times a day (TID) | ORAL | 0 refills | Status: DC | PRN

## 2019-10-11 NOTE — Progress Notes (Signed)
Virtual Visit via Telephone The purpose of this virtual visit is to provide medical care while limiting exposure to the novel coronavirus (COVID19) for both patient and office staff.  Consent was obtained for phone visit:  Yes.   Answered questions that patient had about telehealth interaction:  Yes.   I discussed the limitations, risks, security and privacy concerns of performing an evaluation and management service by telephone. I also discussed with the patient that there may be a patient responsible charge related to this service. The patient expressed understanding and agreed to proceed.  Patient Location: Home Provider Location: [facility])   ---------------------------------------------------------------------- Chief Complaint  Patient presents with  . Cough    low grade fever, nasal congestion, bodyache, fatigue onset yesterday denies chills or bodyache or SOB as per spouse 10 days was around with friends and receive call after 5 days that they had Covid-    S: Reviewed CMA documentation. I have called patient and gathered additional HPI as follows:  COUGH / FEVER / CONGESTION / BODYACHE COVID19 EXPOSURE  Reports that symptoms started onset yesterday with low grade fever, nasal congestion, body ache, fatigue   He has taken some ivermectin already, and asking about this. Also he is asking about antibiotic Admits periodic cough He is able to breathe well  Denies any high risk travel to areas of current concern for COVID19. Denies any known or suspected exposure to person with or possibly with COVID19.  Admits low grade fever, cough  Denies any chills, sweats, shortness of breath, sinus pain or pressure, headache, abdominal pain, diarrhea  -------------------------------------------------------------------------- O: No physical exam performed due to remote telephone  encounter.  -------------------------------------------------------------------------- A&P: Suspected COVID19 acute infection Onset symptoms 1 day Recent exposure confirmed positive covid patients within past 1 week approx He is unvaccinated, his wife also had symptoms No other complicating factors or co morbid conditions No known pulmonary disease Tolerating PO Denies wheezing, or dyspnea. No high fever  Advised COVID19 testing locally or at pharmacy, he declines today. Reviewed guidelines for COVID or viral treatment, advised that I would not offer zpak or ivermectin, he said he has tried some ivermectin that they had. Discussed risks and my concerns  Start Tessalon Perls take 1 capsule up to 3 times a day as needed for cough Start Atrovent nasal spray decongestant 2 sprays in each nostril up to 4 times daily for 7 days Supportive care OTC meds cold & Flu, tylenol PRN  Quarantine protocol as listed below 10-14 days after onset symptoms, goal is fever free / without resp symptoms before return  May consider CXR and Prednisone within 7-10 days if not improving or worsening Return criteria given if severe symptoms   Meds ordered this encounter  Medications  . ipratropium (ATROVENT) 0.06 % nasal spray    Sig: Place 2 sprays into both nostrils 4 (four) times daily. For up to 5-7 days then stop.    Dispense:  15 mL    Refill:  0  . benzonatate (TESSALON) 100 MG capsule    Sig: Take 1 capsule (100 mg total) by mouth 3 (three) times daily as needed for cough.    Dispense:  30 capsule    Refill:  0    REQUIRED self quarantine to [place] - advised to avoid all exposure with others while during TESTING (Pending or if not done) and treatment. Should continue to quarantine for up to 10-14 days -after symptoms onset    If symptoms  do not resolve or significantly improve OR if WORSENING - fever / cough - or worsening shortness of breath - then should contact us and seek  advice on next steps in treatment at home vs where/when to seek care at Urgent Care or Hospital ED for further intervention and possible testing if indicated.  Patient verbalizes understanding with the above medical recommendations including the limitation of remote medical advice.  Specific follow-up / call-back criteria were given for patient to follow-up or seek medical care more urgently if needed.   - Time spent in direct consultation with patient on phone: 15 minutes  Nobie Putnam, [facility] 10/11/2019, 1:55 PM

## 2019-10-11 NOTE — Telephone Encounter (Signed)
Called the patient had virtual apt.

## 2019-10-12 DIAGNOSIS — Z1211 Encounter for screening for malignant neoplasm of colon: Secondary | ICD-10-CM | POA: Diagnosis not present

## 2019-10-13 LAB — COLOGUARD: Cologuard: POSITIVE — AB

## 2019-10-13 NOTE — Telephone Encounter (Signed)
Copied from [place] [id number]. Topic: General - Other >> Oct 13, 2019  3:57 PM Joseph Arroyo wrote: Reason for CRM: Patient called to inform Dr Raliegh Ip and Asa Lente that he had Ar[place]o Covid home test and he and his wife both tested positive. He would like to know if there is anything else can be done further for them please advisePh# [phone]

## 2019-10-13 NOTE — Telephone Encounter (Signed)
As per patient still has cough but Sxs getting improved and denied to get vaccine and was taking tylenol and Aleve but wanted to know if Dr Raliegh Ip can Rx Ivermectin also aware about staying quarantine.

## 2019-10-13 NOTE — Telephone Encounter (Signed)
Copied from [place] [id number]. Topic: General - Other >> Oct 13, 2019  3:57 PM Leward Quan A wrote: Reason for CRM: Patient called to inform Dr Raliegh Ip and Asa Lente that he had a Covid home test and he and his wife both tested positive. He would like to know if there is anything else can be done further for them please advisePh# [phone]

## 2019-10-13 NOTE — Telephone Encounter (Signed)
Called patient to check on his status. He and wife confirmed positive test on home covid test. He is feeling alright, still has cough some improved symptoms. He inquires again about off label use of Ivermectin. I advised him that I would not recommend this at this time based on lack of evidence / research demonstrating its use and guidelines currently are against using this for treating covid. I did advised I will continue to look into it further. He was appreciative of the call. I advised him signs and symptoms if worse to seek treatment promptly at hospital.  Nobie Putnam, [facility] 10/13/2019, 7:28 PM

## 2019-10-16 ENCOUNTER — Telehealth: Payer: Self-pay | Admitting: Family Medicine

## 2019-10-16 NOTE — Telephone Encounter (Signed)
Please notify patient that I spoke with our Infection Disease specialist and they also do not recommend treating COVID with Ivermectin.  Both he and [name] may be eligible for the COVID Antibody IV Infusion therapy however.  We may reach out to the IV antibody infusion center to find out if this patient is eligible.  outpatient COVID-19 Infusion Center  LOCATION: [address] at [facility]           Hours: 8 a.m. - 2 p.m. - Tuesday, Thursday and Saturday           Patient referrals can be made through the [place] hotline number, [phone], or through [place] to the [facility] Infusion group  Could you notify patient first - about this option, and if he wants to pursue this then - could you please contact the COVID infusion center to determine if they are eligible / able to schedule.  Thanks  Nobie Putnam, [facility] 10/16/2019, 6:11 PM

## 2019-10-17 NOTE — Telephone Encounter (Signed)
Patient advised as per Dr Raliegh Ip refused COVID antibody IV infusion therapy.

## 2019-10-19 LAB — COLOGUARD: COLOGUARD: POSITIVE — AB

## 2019-10-23 ENCOUNTER — Encounter: Payer: Self-pay | Admitting: Family Medicine

## 2019-10-26 ENCOUNTER — Telehealth: Payer: Self-pay | Admitting: Family Medicine

## 2019-10-26 DIAGNOSIS — R195 Other fecal abnormalities: Secondary | ICD-10-CM

## 2019-10-26 NOTE — Telephone Encounter (Signed)
Spoke with patient.  He is aware of positive cologuard.  He asks if COVID could interfere and he was having diarrhea at the time of sample collection.  I advised him that I am not aware that this would affect it. However, we do know blood in stool can cause false positive.  He would like to repeat the test if possible.  He will call his [employer] insurance to check if he can repeat it now or in a few months. If it is covered.  Our office can call [place] / Cologuard rep or [place] to check if this is possible, and if we can repeat it, then will try to arrange a new kit to be sent to him if it is covered by his insurance.  He will notify us next week.  Nobie Putnam, [facility] 10/26/2019, 3:57 PM

## 2019-10-26 NOTE — Telephone Encounter (Signed)
Attempted to call patient Thurs 10/26/19  I left a message saying his cologuard result was POSITIVE. Please share this information with them if he calls back or if you can try to reach him later.  I just received his cologuard test result from 10/13/19 - it is POSITIVE. This is an abnormal result. And it means that we need more testing with a colonoscopy next to determine what the source of the problem is.  Most of the time we get a positive Cologuard result, usually it is due to a Polyp that is either benign or possibly pre-cancer that is caught very early and is treatable before it ever turns into a problem or cancer.  The next step is a Colonoscopy procedure to look for the abnormality or polyp and remove and treat it. We would offer this by referring you to a GI specialist locally.  Usually at this location:  [facility]) [address] [place], [place] [address] Phone: [phone]  Once I hear back that he agrees to pursue Colonoscopy, I will place this referral.  Nobie Putnam, [facility] 10/26/2019, 3:23 PM

## 2019-10-27 NOTE — Telephone Encounter (Signed)
Spoke to [employer] rep. And pt in three way call yesterday -they are willing to cover his cologuard second trial since it might not be accurate due to COVID positive test but wants clinical notes which shows positive cologuard test result, the notes can be faxed to appeal claim correspondance team [phone] and also Left message for [facility] to call either Caryl Pina or me back so we can give patient's information so he can receive second kit.

## 2019-10-30 NOTE — Telephone Encounter (Signed)
Letter written with the dates of his test results.  Printed copy of his last telemedicine visit and his cologuard result.  Please fax it to the # in previous message.  Nobie Putnam, [facility] 10/30/2019, 5:06 PM

## 2019-10-31 NOTE — Telephone Encounter (Signed)
Will fax the paper work and patient likes to wait 4-5 month before repeating next Cologuard.

## 2019-10-31 NOTE — Telephone Encounter (Signed)
Thanks. That will be fine, hopefully Cologuard can send him a new kit once they receive those documents and patient can wait a few months before repeat his cologuard kit.  Nobie Putnam, [facility] 10/31/2019, 6:09 PM

## 2019-11-01 NOTE — Telephone Encounter (Signed)
Joseph Arroyo will send text to [facility] reg this patient to send another kit.

## 2019-11-02 ENCOUNTER — Other Ambulatory Visit: Payer: Self-pay | Admitting: Family Medicine

## 2019-11-02 DIAGNOSIS — R058 Other specified cough: Secondary | ICD-10-CM

## 2019-11-02 DIAGNOSIS — Z20822 Contact with and (suspected) exposure to covid-19: Secondary | ICD-10-CM

## 2019-11-02 NOTE — Telephone Encounter (Signed)
Could you contact patient to schedule a follow-up in 4 to 5 months to discuss Cologuard plans. So, that way we can place his order after we speak with him at that visit.  Thanks!  Nobie Putnam, DO [facility] 11/02/2019, 3:36 PM

## 2019-11-02 NOTE — Telephone Encounter (Signed)
Spoke to the patient and he is willing to wait but wants to repeat this year instead of 03/2020 so, appointment scheduled on 02/13/2020 at 11:00 am

## 2019-11-02 NOTE — Telephone Encounter (Signed)
[place] and they told me that a new order will need to be placed in order to ship a new kit.  They reported that they will request medical records on why this test needs to be repeated.  They will reach out to patients insurance company to see if they would be willing to pay.  If insurence will not pay they will reach out to patient.  After reading the previous messages it appears that the patient wants to wait 4-5 months before repeating test.  In that case I assume we need to wait to reorder till he is ready to repeat.

## 2019-11-02 NOTE — Telephone Encounter (Signed)
Requested medication (s) are due for refill today - unsure- acute Rx  Requested medication (s) are on the active medication list -yes  Future visit scheduled -no  Last refill: 10/11/19  Notes to clinic: Request for medication not assigned protocol  Requested Prescriptions  Pending Prescriptions Disp Refills   ipratropium (ATROVENT) 0.06 % nasal spray [Pharmacy Med Name: IPRATROPIUM 0.06% SPRAY] 15 mL     Sig: Place 2 sprays into both nostrils 4 (four) times daily. For up to 5-7 days then stop.      Off-Protocol Failed - 11/02/2019 10:31 AM      Failed - Medication not assigned to a protocol, review manually.      Passed - Valid encounter within last 12 months    Recent Outpatient Visits           3 weeks ago COVID-19 virus infection   [place], DO   1 month ago Annual physical exam   [place], DO   6 months ago Acute right-sided low back pain with right-sided sciatica   [place], DO   8 months ago Contusion of left elbow, initial encounter   [place], DO   1 year ago Annual physical exam   [place], DO             Off-Protocol Failed - 11/02/2019 10:31 AM      Failed - Medication not assigned to a protocol, review manually.      Passed - Valid encounter within last 12 months    Recent Outpatient Visits           3 weeks ago COVID-19 virus infection   [place], DO   1 month ago Annual physical exam   [place], DO   6 months ago Acute right-sided low back pain with right-sided sciatica   [place], DO   8 months ago Contusion of left elbow, initial encounter   [place], DO   1 year  ago Annual physical exam   [place], DO                  Requested Prescriptions  Pending Prescriptions Disp Refills   ipratropium (ATROVENT) 0.06 % nasal spray [Pharmacy Med Name: IPRATROPIUM 0.06% SPRAY] 15 mL     Sig: Place 2 sprays into both nostrils 4 (four) times daily. For up to 5-7 days then stop.      Off-Protocol Failed - 11/02/2019 10:31 AM      Failed - Medication not assigned to a protocol, review manually.      Passed - Valid encounter within last 12 months    Recent Outpatient Visits           3 weeks ago COVID-19 virus infection   [place], DO   1 month ago Annual physical exam   [place], DO   6 months ago Acute right-sided low back pain with right-sided sciatica   [place], DO   8 months ago Contusion of left elbow, initial encounter   [place], DO   1 year ago Annual physical  exam   [place], DO             Off-Protocol Failed - 11/02/2019 10:31 AM      Failed - Medication not assigned to a protocol, review manually.      Passed - Valid encounter within last 12 months    Recent Outpatient Visits           3 weeks ago COVID-19 virus infection   [place], DO   1 month ago Annual physical exam   [place], DO   6 months ago Acute right-sided low back pain with right-sided sciatica   [place], DO   8 months ago Contusion of left elbow, initial encounter   [place], DO   1 year ago Annual physical exam   [place], Devonne Doughty, DO

## 2019-11-21 ENCOUNTER — Ambulatory Visit: Payer: Medicare PPO

## 2020-01-01 ENCOUNTER — Other Ambulatory Visit: Payer: Self-pay | Admitting: Family Medicine

## 2020-01-01 DIAGNOSIS — M5441 Lumbago with sciatica, right side: Secondary | ICD-10-CM

## 2020-01-01 NOTE — Telephone Encounter (Signed)
Approved per protocol.  Requested Prescriptions  Pending Prescriptions Disp Refills  . meloxicam (MOBIC) 15 MG tablet [Pharmacy Med Name: MELOXICAM 15 MG TABLET] 90 tablet 0    Sig: TAKE 0.5-1 TABLETS (7.5-15 MG TOTAL) BY MOUTH DAILY AS NEEDED FOR PAIN.     Analgesics:  COX2 Inhibitors Passed - 01/01/2020  1:22 AM      Passed - HGB in normal range and within 360 days    Hemoglobin  Date Value Ref Range Status  09/07/2019 15.2 13.2 - 17.1 g/dL Final  10/23/2014 15.3 12.6 - 17.7 g/dL Final         Passed - Cr in normal range and within 360 days    Creat  Date Value Ref Range Status  09/07/2019 0.80 0.70 - 1.18 mg/dL Final    Comment:    For patients >51 years of age, the reference limit for Creatinine is approximately 13% higher for people identified as African-American. Renella Cunas - Patient is not pregnant      Passed - Valid encounter within last 12 months    Recent Outpatient Visits          2 months ago COVID-19 virus infection   [place], DO   3 months ago Annual physical exam   [place], DO   8 months ago Acute right-sided low back pain with right-sided sciatica   [place], DO   10 months ago Contusion of left elbow, initial encounter   [place], DO   1 year ago Annual physical exam   [facility] Olin Hauser, DO      Future Appointments            In 1 month [place], Devonne Doughty, [facility], [facility]

## 2020-01-04 DIAGNOSIS — G20 Parkinson's disease: Secondary | ICD-10-CM | POA: Diagnosis not present

## 2020-02-03 ENCOUNTER — Other Ambulatory Visit: Payer: Self-pay | Admitting: Psychiatry

## 2020-02-03 DIAGNOSIS — F411 Generalized anxiety disorder: Secondary | ICD-10-CM

## 2020-02-03 DIAGNOSIS — F3342 Major depressive disorder, recurrent, in full remission: Secondary | ICD-10-CM

## 2020-02-09 ENCOUNTER — Telehealth: Payer: Self-pay

## 2020-02-09 NOTE — Telephone Encounter (Signed)
Copied from [place] [id number]. Topic: General - Other >> Feb 09, 2020 11:20 AM Hinda Lenis D wrote: PT returning call from [facility] / in regards Colon test / please advise

## 2020-02-09 NOTE — Telephone Encounter (Signed)
Copied from [place] [id number]. Topic: General - Other >> Feb 08, 2020 12:59 PM Rainey [place] A wrote: Patient would like a callback from Dr. Eustaquio Boyden nurse to find out if cologuard can be sent to his home instead of him coming to his upcoming appt on Thursday

## 2020-02-09 NOTE — Telephone Encounter (Signed)
Earlier today replied phone call the spouse wanted me to R/S but his appt is already scheduled at convenient day she had requested and she was aware. See other message where patient called back.

## 2020-02-09 NOTE — Telephone Encounter (Signed)
Spoke to wife reg first message --he wanted to find out if he can skip the appointment and only cologuard order can be send --inform patient that the purpose is to get office note faxed to [employer] and insurance coverage for second order. Now patient will keep the appointment.

## 2020-02-12 ENCOUNTER — Ambulatory Visit: Payer: Medicare PPO | Admitting: Psychiatry

## 2020-02-13 ENCOUNTER — Ambulatory Visit: Payer: Medicare PPO | Admitting: Family Medicine

## 2020-02-15 ENCOUNTER — Other Ambulatory Visit: Payer: Self-pay | Admitting: Family Medicine

## 2020-02-15 ENCOUNTER — Ambulatory Visit: Payer: Medicare PPO | Admitting: Family Medicine

## 2020-02-15 ENCOUNTER — Other Ambulatory Visit: Payer: Self-pay

## 2020-02-15 ENCOUNTER — Encounter: Payer: Self-pay | Admitting: Family Medicine

## 2020-02-15 VITALS — BP 116/63 | HR 57 | Temp 97.7°F | Resp 16 | Ht 68.0 in | Wt 155.0 lb

## 2020-02-15 DIAGNOSIS — Z0000 Encounter for general adult medical examination without abnormal findings: Secondary | ICD-10-CM

## 2020-02-15 DIAGNOSIS — G20 Parkinson's disease: Secondary | ICD-10-CM

## 2020-02-15 DIAGNOSIS — N400 Enlarged prostate without lower urinary tract symptoms: Secondary | ICD-10-CM

## 2020-02-15 DIAGNOSIS — Z1211 Encounter for screening for malignant neoplasm of colon: Secondary | ICD-10-CM | POA: Diagnosis not present

## 2020-02-15 DIAGNOSIS — E559 Vitamin D deficiency, unspecified: Secondary | ICD-10-CM

## 2020-02-15 DIAGNOSIS — R7309 Other abnormal glucose: Secondary | ICD-10-CM

## 2020-02-15 NOTE — Progress Notes (Signed)
Subjective:    Patient ID: Joseph Arroyo, male    DOB: 06-19-1940, 79 y.o.   MRN: [id number]  Joseph Arroyo is a 79 y.o. male presenting on 02/15/2020 for colon cancer screening   HPI   Screening for Colon Cancer Patient has prior history of normal colonoscopy, result is not on file. Last visit for annual physical in 08/2019 we ordered Cologuard screening test for him. He ultimately completed this test in August and it resulted as POSITIVE Cologuard on 10/13/19, however we found out that he was also tested POSITIVE for COVID on 10/11/19. The clinical concern was that the cologuard may be at risk of false positive in scenario of positive COVID. Therefore, it was recommended repeat test after several months have passed.  He is currently symptom free from [place] and had recovered. He is asymptomatic from GI stand point regarding colon screening.  He is ready to repeat Cologuard test again today.  Chronic Back Pain Works with daughter, who is PT He asks about seeing a Restaurant manager, fast food, asks for recommendations   Depression screen [facility] 2/9 09/19/2019 09/07/2019 03/07/2019  Decreased Interest 0 0 0  Down, Depressed, Hopeless 0 0 0  PHQ - 2 Score 0 0 0  Altered sleeping 0 0 -  Tired, decreased energy 1 1 -  Change in appetite 0 0 -  Feeling bad or failure about yourself  0 0 -  Trouble concentrating 0 0 -  Moving slowly or fidgety/restless 0 0 -  Suicidal thoughts 0 0 -  PHQ-9 Score 1 1 -  Difficult doing work/chores Not difficult at all Not difficult at all -    Social History   Tobacco Use  . Smoking status: Never Smoker  . Smokeless tobacco: Never Used  Vaping Use  . Vaping Use: Never used  Substance Use Topics  . Alcohol use: No    Alcohol/week: 0.0 standard drinks  . Drug use: No    Review of Systems Per HPI unless specifically indicated above     Objective:    BP 116/63   Pulse (!) 57   Temp 97.7 F (36.5 C) (Temporal)   Resp 16   Ht 5\' 8"  (1.727 m)   Wt  155 lb (70.3 kg)   SpO2 99%   BMI 23.57 kg/m   Wt Readings from Last 3 Encounters:  02/15/20 155 lb (70.3 kg)  09/19/19 155 lb (70.3 kg)  09/07/19 155 lb 11.2 oz (70.6 kg)    Physical Exam Vitals and nursing note reviewed.  Constitutional:      General: He is not in acute distress.    Appearance: He is well-developed and well-nourished. He is not diaphoretic.     Comments: Well-appearing, comfortable, cooperative  HENT:     Head: Normocephalic and atraumatic.     Mouth/Throat:     Mouth: Oropharynx is clear and moist.  Eyes:     General:        Right eye: No discharge.        Left eye: No discharge.     Conjunctiva/sclera: Conjunctivae normal.  Cardiovascular:     Rate and Rhythm: Normal rate.  Pulmonary:     Effort: Pulmonary effort is normal.  Musculoskeletal:        General: No edema.  Skin:    General: Skin is warm and dry.     Findings: No erythema or rash.  Neurological:     Mental Status: He is alert and oriented to person, place,  and time.  Psychiatric:        Mood and Affect: Mood and affect normal.        Behavior: Behavior normal.     Comments: Well groomed, good eye contact, normal speech and thoughts       Results for orders placed or performed in visit on 10/26/19  Novel Coronavirus, NAA ([facility])   Specimen: Nasopharyngeal(NP) swabs in vial transport medium  Result Value Ref Range   SARS-CoV-2, NAA POSITIVE       Assessment & Plan:   Problem List Items Addressed This Visit   None   Visit Diagnoses    Colon cancer screening    -  Primary   Relevant Orders   Cologuard      Due for routine colon cancer screening. Last colonoscopy negative, years ago, no report. No family history colon cancer. - Discussion today about recommendations for either Colonoscopy or Cologuard screening, benefits and risks of screening, interested in Cologuard, understands that if positive then recommendation is for diagnostic colonoscopy to follow-up. - Ordered  Cologuard today - NOTE - He had prior POSITIE Cologuard on 10/13/19. This was suspected to be false positive in setting of diarrhea and GI symptoms caused by COVID - confirmed 10/11/19.  Now repeat Cologuard has been recommended at least 4 months after. Will send new cologuard order and copy of record.   No orders of the defined types were placed in this encounter.     Follow up plan: Return in about 7 months (around 09/14/2020) for 7 month fasting lab only then 1 week later Annual Physical.  Future labs ordered for 08/2020   Nobie Putnam, [facility] 02/15/2020, 10:54 AM

## 2020-02-15 NOTE — Patient Instructions (Addendum)
Thank you for coming to the office today.  [facility]  [facility] [address] [place], [place] [address] Phone: [phone]  [facility] [place], [place] [address]  -----------------------  DUE for FASTING BLOOD WORK (no food or drink after midnight before the lab appointment, only water or coffee without cream/sugar on the morning of)  SCHEDULE "Lab Only" visit in the morning at the clinic for lab draw in 7 MONTHS   - Make sure Lab Only appointment is at about 1 week before your next appointment, so that results will be available  For Lab Results, once available within 2-3 days of blood draw, you can can log in to MyChart online to view your results and a brief explanation. Also, we can discuss results at next follow-up visit.     Please schedule a Follow-up Appointment to: Return in about 7 months (around 09/14/2020) for 7 month fasting lab only then 1 week later Annual Physical.  If you have any other questions or concerns, please feel free to call the office or send a message through [place]. You may also schedule an earlier appointment if necessary.  Additionally, you may be receiving a survey about your experience at our office within a few days to 1 week by e-mail or mail. We value your feedback.  Nobie Putnam, DO [place]

## 2020-03-16 LAB — COLOGUARD: Cologuard: NEGATIVE

## 2020-03-19 ENCOUNTER — Other Ambulatory Visit: Payer: Self-pay

## 2020-03-19 ENCOUNTER — Encounter: Payer: Self-pay | Admitting: Psychiatry

## 2020-03-19 ENCOUNTER — Ambulatory Visit (INDEPENDENT_AMBULATORY_CARE_PROVIDER_SITE_OTHER): Payer: Medicare PPO | Admitting: Psychiatry

## 2020-03-19 DIAGNOSIS — F411 Generalized anxiety disorder: Secondary | ICD-10-CM | POA: Diagnosis not present

## 2020-03-19 DIAGNOSIS — F3342 Major depressive disorder, recurrent, in full remission: Secondary | ICD-10-CM

## 2020-03-19 MED ORDER — PAROXETINE HCL 20 MG PO TABS: ORAL_TABLET | ORAL | 1 refills | Status: DC

## 2020-03-19 MED ORDER — GABAPENTIN 100 MG PO CAPS: ORAL_CAPSULE | ORAL | 1 refills | Status: DC

## 2020-03-19 MED ORDER — L-METHYLFOLATE 15 MG PO TABS: 15.0000 mg | ORAL_TABLET | Freq: Every day | ORAL | 3 refills | Status: DC

## 2020-03-19 MED ORDER — PAROXETINE HCL 40 MG PO TABS: ORAL_TABLET | ORAL | 1 refills | Status: DC

## 2020-03-19 NOTE — Progress Notes (Signed)
Joseph Arroyo [id number] 13-Sep-1940 80 y.o.  Subjective:   Patient ID:  Joseph Arroyo is a 80 y.o. (DOB [birth year]) male.  Chief Complaint:  Chief Complaint  Patient presents with  . Follow-up    depression and anxiety depression and anxiety       Depression        Associated symptoms include no fatigue.  Joseph Arroyo presents to the office today for follow-up of major depression and generalized anxiety disorder.    He was last seen December, 2020 & June 2021.  No meds were changed.  Continued paroxetine 60 mg daily plus gabapentin 100 mg twice daily and Mirapex 0.125 mg 3 times daily.   08/2019 appt without med changes noted: Tolerates the meds well.  Wife thinks he's doing very well and he agrees.   No complaints.  2 D's.   Still exercising and has fatigue but can function and enjoys activity. Restarted men's Bible study.  He feels positive and hopeful and useful.  At some point would like to be free of medication.  Does not feel guilty about the meds.    03/19/2020 appt noted:  Seen with wife [name] and took Ivermectin and both had it mild. As far mood concerned he's doing well.  Wife agrees.  Not markedly depressed or anxious.  Feels the suffering of others.  [name] in hospital in [place].  Friend with Covid also. No episodes. Tolerating meds.  No Xanax used.  Attend [employer] in [place]. Usually sleep well but back pain can interfere with sleep. TX for PD and a bit slower per wife.  Was 12 years free of medication until this last relapse and yet it took a long time to get relief again.  Wife, [name], said overall he's been doing well.  No prn BZ in a couple of years.  Past Psychiatric Medication Trials: Under our care since 2013. Paroxetine 60, duloxetine, fluoxetine, Luvox, venlafaxine, Trintellix, , mirtazapine, gabapentin, Deplin, clonazepam, lorazepam  lamotrigine,  buspirone,  Abilify, lithium with some benefit,  Seroquel, Zyprexa,  risperidone, Saphris, doxepin,  pramipexole  Review of Systems:  Review of Systems  Constitutional: Negative for fatigue.  Musculoskeletal: Positive for back pain.  Neurological: Positive for tremors and weakness. Negative for dizziness.  Psychiatric/Behavioral: Positive for depression.    Medications: I have reviewed the patient's current medications.  Current Outpatient Medications  Medication Sig Dispense Refill  . B Complex Vitamins (VITAMIN B COMPLEX PO) Take 1 tablet by mouth daily.    . baclofen (LIORESAL) 10 MG tablet TAKE 0.5-1 TABLETS (5-10 MG TOTAL) BY MOUTH 2 (TWO) TIMES DAILY AS NEEDED FOR MUSCLE SPASMS. 30 tablet 1  . Capsicum-Garlic 086-578 MG CAPS Take 200-300 mg by mouth.    . carbidopa-levodopa (SINEMET IR) 25-100 MG tablet Take 2 tablets by mouth 3 (three) times daily.    . clotrimazole-betamethasone (LOTRISONE) cream Apply 1-2 times a day for worsening flare dry skin dermatitis of toes/feet, may re-use daily up to 1 week as needed. 30 g 1  . fluticasone (FLONASE) 50 MCG/ACT nasal spray SPRAY 2 SPRAYS INTO EACH NOSTRIL EVERY DAY 16 g 8  . HAWTHORNE BERRY PO Take by mouth daily.    Marland Kitchen ibuprofen (ADVIL,MOTRIN) 200 MG tablet Take 200 mg by mouth every 6 (six) hours as needed for moderate pain.    Marland Kitchen ipratropium (ATROVENT) 0.06 % nasal spray PLACE 2 SPRAYS INTO BOTH NOSTRILS 4 (FOUR) TIMES DAILY. FOR UP TO 5-7 DAYS  THEN STOP. 15 mL 2  . L-Methylfolate 15 MG TABS Take 1 tablet (15 mg total) by mouth daily. 90 tablet 3  . Lutein 10 MG TABS Take 1 tablet by mouth 3 (three) times daily.    . meloxicam (MOBIC) 15 MG tablet TAKE 0.5-1 TABLETS (7.5-15 MG TOTAL) BY MOUTH DAILY AS NEEDED FOR PAIN. 90 tablet 0  . omega-3 acid ethyl esters (LOVAZA) 1 g capsule Take 1 capsule by mouth daily.    . pramipexole (MIRAPEX) 0.125 MG tablet Take 0.125 mg by mouth 4 (four) times daily.    . PSYLLIUM HUSK PO Take by mouth.    . Saw Palmetto, Serenoa repens, 1000 MG CAPS Take  2 capsules by mouth daily.    Marland Kitchen gabapentin (NEURONTIN) 100 MG capsule 1 capsule in the AM and 2 capsules in evening 270 capsule 1  . PARoxetine (PAXIL) 20 MG tablet TAKE 1 TABLET BY MOUTH EVERY DAY (WITH THE 40 MG TABLET FOR 60 MG TOTAL) 90 tablet 1  . PARoxetine (PAXIL) 40 MG tablet TAKE 1 TABLET BY MOUTH EVERY DAY (WITH THE 20 MG TABLET FOR 60 MG TOTAL) 90 tablet 1   No current facility-administered medications for this visit.    Medication Side Effects: Fatigue and Other: from Mirapex esp in AM  Allergies:  Allergies  Allergen Reactions  . Prednisone     Hyperactivity   . Wheat Bran Diarrhea    Past Medical History:  Diagnosis Date  . Allergic rhinitis due to pollen 11/21/2007  . Brachial neuritis or radiculitis NOS   . Cervicalgia   . Concussion    age 30 - s/p accident  . Costal chondritis   . Depression   . Essential and other specified forms of tremor   . GERD (gastroesophageal reflux disease)    in past  . Lumbago   . Occlusion and stenosis of carotid artery without mention of cerebral infarction   . Seizures [facility])    age 61 - after concussion    Family History  Problem Relation Age of Onset  . Heart failure Mother        enlarged heart   . Asthma Father     Social History   Socioeconomic History  . Marital status: Married    Spouse name: Villard Rusek  . Number of children: 2  . Years of education: 16  . Highest education level: Master's degree (e.g., MA, MS, MEng, MEd, MSW, MBA)  Occupational History  . 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 Smoking status: Never Smoker  . Smokeless tobacco: Never Used  Vaping Use  . Vaping Use: Never used  Substance and Sexual Activity  . Alcohol use: No    Alcohol/week: 0.0 standard drinks  . Drug use: No  . Sexual activity: Not Currently  Other Topics Concern  . Not on file  Social History Narrative   Married to [name], has 2 children, has grandchildren   Right  handed   Master's plus   He is born in [place], heritage is [place] (Father's side), [place] (Mother's side)      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    Social Determinants of Radio broadcast assistant Strain: Low Risk   . Difficulty of Paying Living Expenses: Not hard at all  Food Insecurity: No Food Insecurity  . Worried About Charity fundraiser in the Last Year: Never true  . Ran Out of Food in the Last Year:  Never true  Transportation Needs: No Transportation Needs  . Lack of Transportation (Medical): No  . Lack of Transportation (Non-Medical): No  Physical Activity: Sufficiently Active  . Days of Exercise per Week: 3 days  . Minutes of Exercise per Session: 90 min  Stress: No Stress Concern Present  . Feeling of Stress : Not at all  Social Connections: Not on file  Intimate Partner Violence: Not on file    Past Medical History, Surgical history, Social history, and Family history were reviewed and updated as appropriate.   Please see review of systems for further details on the patient's review from today.   Objective:   Physical Exam:  There were no vitals taken for this visit.  Physical Exam Constitutional:      General: He is not in acute distress.    Appearance: He is well-developed.  Musculoskeletal:        General: No deformity.  Neurological:     Mental Status: He is alert and oriented to person, place, and time.     Motor: Tremor present.     Coordination: Coordination normal.  Psychiatric:        Attention and Perception: Attention and perception normal. He does not perceive auditory or visual hallucinations.        Mood and Affect: Mood normal. Mood is not anxious or depressed. Affect is not labile, blunt, angry or inappropriate.        Speech: Speech normal.        Behavior: Behavior normal.        Thought Content: Thought content normal. Thought content does not include homicidal or suicidal ideation. Thought content does not include homicidal or  suicidal plan.        Cognition and Memory: Cognition and memory normal.        Judgment: Judgment normal.     Comments: Insight intact.  Talkative. No delusions.      Lab Review:     Component Value Date/Time   NA 140 09/07/2019 0941   NA 143 07/10/2015 0821   K 4.3 09/07/2019 0941   CL 103 09/07/2019 0941   CO2 32 09/07/2019 0941   GLUCOSE 93 09/07/2019 0941   BUN 19 09/07/2019 0941   BUN 13 07/10/2015 0821   CREATININE 0.80 09/07/2019 0941   CALCIUM 9.2 09/07/2019 0941   PROT 6.9 09/07/2019 0941   PROT 6.8 07/10/2015 0821   ALBUMIN 3.6 07/24/2016 1050   ALBUMIN 4.2 07/10/2015 0821   AST 19 09/07/2019 0941   ALT 5 (L) 09/07/2019 0941   ALKPHOS 49 07/24/2016 1050   BILITOT 0.8 09/07/2019 0941   BILITOT 0.5 07/10/2015 0821   GFRNONAA 86 09/07/2019 0941   GFRAA 100 09/07/2019 0941       Component Value Date/Time   WBC 4.7 09/07/2019 0941   RBC 4.93 09/07/2019 0941   HGB 15.2 09/07/2019 0941   HGB 15.3 10/23/2014 1237   HCT 45.5 09/07/2019 0941   HCT 45.1 10/23/2014 1237   PLT 163 09/07/2019 0941   PLT 150 10/23/2014 1237   MCV 92.3 09/07/2019 0941   MCV 88 10/23/2014 1237   MCH 30.8 09/07/2019 0941   MCHC 33.4 09/07/2019 0941   RDW 12.4 09/07/2019 0941   RDW 13.3 10/23/2014 1237   LYMPHSABS 1,170 09/07/2019 0941   LYMPHSABS 1.7 10/23/2014 1237   MONOABS 357 07/24/2016 1050   EOSABS 409 09/07/2019 0941   EOSABS 0.3 10/23/2014 1237   BASOSABS 80 09/07/2019 0941   BASOSABS  0.1 10/23/2014 1237    No results found for: POCLITH, LITHIUM   No results found for: PHENYTOIN, PHENOBARB, VALPROATE, CBMZ   .res Assessment: Plan:    Deontaye was seen today for follow-up.  Diagnoses and all orders for this visit:  GAD (generalized anxiety disorder) -     gabapentin (NEURONTIN) 100 MG capsule; 1 capsule in the AM and 2 capsules in evening -     PARoxetine (PAXIL) 20 MG tablet; TAKE 1 TABLET BY MOUTH EVERY DAY (WITH THE 40 MG TABLET FOR 60 MG TOTAL) -      PARoxetine (PAXIL) 40 MG tablet; TAKE 1 TABLET BY MOUTH EVERY DAY (WITH THE 20 MG TABLET FOR 60 MG TOTAL)  Major depression, recurrent, full remission (HCC) -     PARoxetine (PAXIL) 20 MG tablet; TAKE 1 TABLET BY MOUTH EVERY DAY (WITH THE 40 MG TABLET FOR 60 MG TOTAL) -     PARoxetine (PAXIL) 40 MG tablet; TAKE 1 TABLET BY MOUTH EVERY DAY (WITH THE 20 MG TABLET FOR 60 MG TOTAL)    History of extremely severe TR depression and anxiety with multiple med failures.  Doing well for 2-3 years now.  Encourage acceptance of taking the med and going out with life.    Cont meds.  Bad idea to stop meds.   Strongly rec cont meds DT difficulty getting this last episode under control.  Disc SE.Marland Kitchen     Still works out at gym.  Disc pricing L-methylfolate with GoodRx.  Parkiinson's is better with meds.  Try increasing gabapentin to 100 mg AM & 200 mg PM for back pain.    FU 6 mos.  Lynder Parents, MD, DFAPA   Please see After Visit Summary for patient specific instructions.  Future Appointments  Date Time Provider [place]  05/15/2021  8:30 AM [facility] VASC 1 [facility] None  05/15/2021  9:30 AM Schnier, Dolores Lory, MD [facility] None    No orders of the defined types were placed in this encounter.   -------------------------------

## 2020-03-26 ENCOUNTER — Other Ambulatory Visit: Payer: Self-pay | Admitting: Family Medicine

## 2020-03-26 DIAGNOSIS — M5441 Lumbago with sciatica, right side: Secondary | ICD-10-CM

## 2020-04-01 LAB — COLOGUARD: COLOGUARD: NEGATIVE

## 2020-04-01 LAB — EXTERNAL GENERIC LAB PROCEDURE: COLOGUARD: NEGATIVE

## 2020-04-04 ENCOUNTER — Encounter: Payer: Self-pay | Admitting: Family Medicine

## 2020-04-16 DIAGNOSIS — H353131 Nonexudative age-related macular degeneration, bilateral, early dry stage: Secondary | ICD-10-CM | POA: Diagnosis not present

## 2020-05-14 ENCOUNTER — Telehealth (INDEPENDENT_AMBULATORY_CARE_PROVIDER_SITE_OTHER): Payer: Self-pay | Admitting: Vascular Surgery

## 2020-05-14 NOTE — Telephone Encounter (Signed)
Patient called to confirm if this appointment is truly needed.  Patient was seen last year 05/15/2019. Per notes it states see GS in 2 years.  He would like to know if this appointment can be cancelled?

## 2020-05-14 NOTE — Telephone Encounter (Signed)
Left a message on patient voicemail to return a call back to the office

## 2020-05-16 NOTE — Telephone Encounter (Signed)
On vascular stand point patient should keep his appointment as schedule but if he decided to canceled the appointment that will be his decision.

## 2020-07-11 DIAGNOSIS — G20 Parkinson's disease: Secondary | ICD-10-CM | POA: Diagnosis not present

## 2020-08-23 ENCOUNTER — Telehealth: Payer: Self-pay

## 2020-08-23 DIAGNOSIS — G8929 Other chronic pain: Secondary | ICD-10-CM

## 2020-08-23 DIAGNOSIS — G20 Parkinson's disease: Secondary | ICD-10-CM

## 2020-08-23 NOTE — Telephone Encounter (Signed)
Copied from [place] [id number]. Topic: Referral - Request for Referral >> Aug 23, 2020  1:36 PM Lennox Solders wrote: Has patient seen PCP for this complaint? Yes for back pain in dec 2021. Dr Raliegh Ip put on avs names of chiropractor in area . Pt would like a referral now. Pt has [employer] .insurance. The names on AVS one is world class chiropractor [id number] [place] phone number [phone] and other one is [facility]

## 2020-08-23 NOTE — Telephone Encounter (Signed)
Referral submitted  Nobie Putnam, [facility] 08/23/2020, 5:09 PM

## 2020-08-26 ENCOUNTER — Ambulatory Visit: Payer: Medicare PPO | Admitting: Family Medicine

## 2020-09-03 ENCOUNTER — Telehealth: Payer: Self-pay | Admitting: Family Medicine

## 2020-09-03 NOTE — Telephone Encounter (Signed)
Pt called to report that he actually saw Dr. Patria Mane [address] [place] in [place]. He just wanted to report this info to PCP.

## 2020-09-03 NOTE — Telephone Encounter (Signed)
Copied from [place] [id number]. Topic: Medicare AWV >> Sep 03, 2020  2:47 PM Cher Nakai R wrote: Reason for CRM:  Left message for patient to call back and schedule the Medicare Annual Wellness Visit (AWV) virtually or by telephone.  Last AWV 09/19/2019  Please schedule at anytime with [facility].  40 minute appointment  Any questions, please call me at [phone]

## 2020-09-04 NOTE — Telephone Encounter (Signed)
Can you route this to [facility] for review to update referral?  Nobie Putnam, [facility] 09/04/2020, 10:00 AM

## 2020-09-17 ENCOUNTER — Ambulatory Visit: Payer: Medicare PPO | Admitting: Psychiatry

## 2020-09-20 ENCOUNTER — Other Ambulatory Visit: Payer: Self-pay

## 2020-09-20 DIAGNOSIS — E559 Vitamin D deficiency, unspecified: Secondary | ICD-10-CM

## 2020-09-20 DIAGNOSIS — N400 Enlarged prostate without lower urinary tract symptoms: Secondary | ICD-10-CM

## 2020-09-20 DIAGNOSIS — Z0000 Encounter for general adult medical examination without abnormal findings: Secondary | ICD-10-CM

## 2020-09-20 DIAGNOSIS — G20 Parkinson's disease: Secondary | ICD-10-CM

## 2020-09-20 DIAGNOSIS — R7309 Other abnormal glucose: Secondary | ICD-10-CM

## 2020-09-23 ENCOUNTER — Other Ambulatory Visit: Payer: Self-pay

## 2020-09-23 ENCOUNTER — Other Ambulatory Visit: Payer: Medicare PPO

## 2020-09-23 DIAGNOSIS — R7309 Other abnormal glucose: Secondary | ICD-10-CM | POA: Diagnosis not present

## 2020-09-23 DIAGNOSIS — E785 Hyperlipidemia, unspecified: Secondary | ICD-10-CM

## 2020-09-23 DIAGNOSIS — Z0000 Encounter for general adult medical examination without abnormal findings: Secondary | ICD-10-CM | POA: Diagnosis not present

## 2020-09-23 DIAGNOSIS — G20 Parkinson's disease: Secondary | ICD-10-CM | POA: Diagnosis not present

## 2020-09-23 DIAGNOSIS — E559 Vitamin D deficiency, unspecified: Secondary | ICD-10-CM | POA: Diagnosis not present

## 2020-09-23 DIAGNOSIS — N400 Enlarged prostate without lower urinary tract symptoms: Secondary | ICD-10-CM | POA: Diagnosis not present

## 2020-09-24 LAB — COMPLETE METABOLIC PANEL WITH GFR
AG Ratio: 1.7 (calc) (ref 1.0–2.5)
ALT: 22 U/L (ref 9–46)
AST: 18 U/L (ref 10–35)
Albumin: 4.2 g/dL (ref 3.6–5.1)
Alkaline phosphatase (APISO): 49 U/L (ref 35–144)
BUN: 16 mg/dL (ref 7–25)
CO2: 29 mmol/L (ref 20–32)
Calcium: 9.1 mg/dL (ref 8.6–10.3)
Chloride: 104 mmol/L (ref 98–110)
Creat: 0.76 mg/dL (ref 0.70–1.28)
Globulin: 2.5 g/dL (calc) (ref 1.9–3.7)
Glucose, Bld: 81 mg/dL (ref 65–99)
Potassium: 4 mmol/L (ref 3.5–5.3)
Sodium: 140 mmol/L (ref 135–146)
Total Bilirubin: 0.8 mg/dL (ref 0.2–1.2)
Total Protein: 6.7 g/dL (ref 6.1–8.1)
eGFR: 92 mL/min/{1.73_m2} (ref >=60)

## 2020-09-24 LAB — CBC WITH DIFFERENTIAL/PLATELET
Absolute Monocytes: 351 cells/uL (ref 200–950)
Basophils Absolute: 72 cells/uL (ref 0–200)
Basophils Relative: 1.1 %
Eosinophils Absolute: 371 cells/uL (ref 15–500)
Eosinophils Relative: 5.7 %
HCT: 45.2 % (ref 38.5–50.0)
Hemoglobin: 15.2 g/dL (ref 13.2–17.1)
Lymphs Abs: 1411 cells/uL (ref 850–3900)
MCH: 31.3 pg (ref 27.0–33.0)
MCHC: 33.6 g/dL (ref 32.0–36.0)
MCV: 93 fL (ref 80.0–100.0)
MPV: 10.5 fL (ref 7.5–12.5)
Monocytes Relative: 5.4 %
Neutro Abs: 4297 cells/uL (ref 1500–7800)
Neutrophils Relative %: 66.1 %
Platelets: 183 10*3/uL (ref 140–400)
RBC: 4.86 10*6/uL (ref 4.20–5.80)
RDW: 12.2 % (ref 11.0–15.0)
Total Lymphocyte: 21.7 %
WBC: 6.5 10*3/uL (ref 3.8–10.8)

## 2020-09-24 LAB — LIPID PANEL
Cholesterol: 134 mg/dL (ref <200)
HDL: 57 mg/dL (ref >=40)
LDL Cholesterol (Calc): 61 mg/dL (calc)
Non-HDL Cholesterol (Calc): 77 mg/dL (calc) (ref <130)
Total CHOL/HDL Ratio: 2.4 (calc) (ref <5.0)
Triglycerides: 82 mg/dL (ref <150)

## 2020-09-24 LAB — HEMOGLOBIN A1C
Hgb A1c MFr Bld: 5 % of total Hgb (ref <5.7)
Mean Plasma Glucose: 97 mg/dL
eAG (mmol/L): 5.4 mmol/L

## 2020-09-24 LAB — PSA: PSA: 1.73 ng/mL (ref <=4.00)

## 2020-09-24 LAB — VITAMIN D 25 HYDROXY (VIT D DEFICIENCY, FRACTURES): Vit D, 25-Hydroxy: 44 ng/mL (ref 30–100)

## 2020-10-01 ENCOUNTER — Other Ambulatory Visit: Payer: Self-pay | Admitting: Family Medicine

## 2020-10-01 ENCOUNTER — Encounter: Payer: Self-pay | Admitting: Family Medicine

## 2020-10-01 ENCOUNTER — Other Ambulatory Visit: Payer: Self-pay

## 2020-10-01 ENCOUNTER — Ambulatory Visit (INDEPENDENT_AMBULATORY_CARE_PROVIDER_SITE_OTHER): Payer: Medicare PPO | Admitting: Family Medicine

## 2020-10-01 VITALS — BP 118/55 | HR 78 | Ht 68.0 in | Wt 148.0 lb

## 2020-10-01 DIAGNOSIS — R252 Cramp and spasm: Secondary | ICD-10-CM

## 2020-10-01 DIAGNOSIS — N400 Enlarged prostate without lower urinary tract symptoms: Secondary | ICD-10-CM | POA: Diagnosis not present

## 2020-10-01 DIAGNOSIS — M545 Low back pain, unspecified: Secondary | ICD-10-CM | POA: Diagnosis not present

## 2020-10-01 DIAGNOSIS — E785 Hyperlipidemia, unspecified: Secondary | ICD-10-CM

## 2020-10-01 DIAGNOSIS — I739 Peripheral vascular disease, unspecified: Secondary | ICD-10-CM | POA: Diagnosis not present

## 2020-10-01 DIAGNOSIS — Z0000 Encounter for general adult medical examination without abnormal findings: Secondary | ICD-10-CM

## 2020-10-01 DIAGNOSIS — M25562 Pain in left knee: Secondary | ICD-10-CM

## 2020-10-01 DIAGNOSIS — I6523 Occlusion and stenosis of bilateral carotid arteries: Secondary | ICD-10-CM

## 2020-10-01 DIAGNOSIS — R7989 Other specified abnormal findings of blood chemistry: Secondary | ICD-10-CM

## 2020-10-01 DIAGNOSIS — G20 Parkinson's disease: Secondary | ICD-10-CM

## 2020-10-01 DIAGNOSIS — F3342 Major depressive disorder, recurrent, in full remission: Secondary | ICD-10-CM | POA: Diagnosis not present

## 2020-10-01 DIAGNOSIS — R799 Abnormal finding of blood chemistry, unspecified: Secondary | ICD-10-CM

## 2020-10-01 DIAGNOSIS — G8929 Other chronic pain: Secondary | ICD-10-CM

## 2020-10-01 NOTE — Assessment & Plan Note (Signed)
Stable chronic BPH without significant symptoms on Saw Palmetto - Last PSA stable at 1.7 - due today for lab - Last DRE 09/03/17 - mild enlarged smooth - No known personal/family history of prostate CA  Plan: Continue supplement Saw Palmetto Follow-up if worsening

## 2020-10-01 NOTE — Assessment & Plan Note (Signed)
Stable bilateral carotid stenosis w/ PAD Declines statin.

## 2020-10-01 NOTE — Patient Instructions (Addendum)
Thank you for coming to the office today.  Keep up the good work  DUE for NON FASTING BLOOD WORK  SCHEDULE "Lab Only" visit in the morning at the clinic for lab draw in 2 WEEKS   - Make sure Lab Only appointment is at about 1 week before your next appointment, so that results will be available  For Lab Results, once available within 2-3 days of blood draw, you can can log in to MyChart online to view your results and a brief explanation. Also, we can discuss results at next follow-up visit.   Please schedule a Follow-up Appointment to: Return in about 2 weeks (around 10/15/2020) for 2 week AM Lab only 9am or earlier for Testosterone lab. F/u result can schedule ov if needed.  If you have any other questions or concerns, please feel free to call the office or send a message through [facility]. You may also schedule an earlier appointment if necessary.  Additionally, you may be receiving a survey about your experience at our office within a few days to 1 week by e-mail or mail. We value your feedback.  Nobie Putnam, DO [place]

## 2020-10-01 NOTE — Assessment & Plan Note (Signed)
Stable, chronic problem w/ tremors primarily Followed by [facility] Neurology Dr Kalman Shan On carbidopa-levodopa Has been weaning off mirapex.  Continues to exercise and remain active Follow-up as planned

## 2020-10-01 NOTE — Assessment & Plan Note (Signed)
Followed by Psychiatry In remission Continue SSRI Paxil therapy

## 2020-10-01 NOTE — Progress Notes (Signed)
Subjective:    Patient ID: Joseph Arroyo, male    DOB: [birth year], 80 y.o.   MRN: [id number]  Joseph Arroyo is a 80 y.o. male presenting on 10/01/2020 for Annual Exam and Back Pain   HPI  Here for Annual Physical and Due for fasting labs     Parkinson's Disease / Tremors Followed by Dr Nicki Reaper Demetra Shiner ([place]), last seen 06/2020, prior diagnosis is for tremor-dominant PD. He continues on current medication with carvidopa-levodopa and mirapex now tapered off Mirapex.   Lifestyle He exercises regularly at gym 3 x weekly, will do stretching as well He is working on improving home PT exercises Takes multiple daily supplements per current meds list   Bilateral Carotid Artery Stenosis / PAD Followed by Vascular specialist in [place]. Prior imaging showed mild < 39% narrowing bilateral arteries, has upcoming carotid duplex Due for lipid panel for monitoring.     Major Depression recurrent full remission He is currently followed by Dr Lynder Parents [facility] Psychiatry) He takes anti-depressant Paxil 34m regularly (439m+ 2045mill)   Fatigue Last visit reviewed background, labs unremarkable, with normal Vitamin D, TSH, CBC Clinically no other obvious symptoms at this time.   BPH without significant LUTS No new concerns. Last PSA screening recently normal He takes SawC.H. Robinson Worldwideth improvement in LUTS, now has minimal symptoms   PMH Back Pain, Knee Pain, Osteoarthritis - Ortho Dr HooMarry Guanast imaging MRI 04/2017 showed suspected partial tear in multiple locations  Followed by Chiropractor Dr JoePatria Mane BurRichmond Daleith some good results. He wears a back brace, and has issues standing for prolonged time periods. He still goes to the gym about 3 x week if possible, some days misses it due to less mobility and other activities.  He was taking Meloxicam or Aleve PRN in AM, and then could switch in PM w/ Tylenol.     Health  Maintenance:   Prostate CA Screening: Prior PSA / DRE reported normal. Last PSA normal Currently asymptomatic. No known family history of prostate CA.   Colon CA Screening - previous colonoscopy negative. Now last updated Cologuard 03/2020 negative. Following prior abnormal positive cologuard due to illness w/ COVID, now repeat was negative good for 3 years.   Depression screen [facility] 10/01/2020 09/19/2019 09/07/2019  Decreased Interest 0 0 0  Down, Depressed, Hopeless 0 0 0  PHQ - 2 Score 0 0 0  Altered sleeping 0 0 0  Tired, decreased energy '1 1 1  ' Change in appetite 0 0 0  Feeling bad or failure about yourself  0 0 0  Trouble concentrating 0 0 0  Moving slowly or fidgety/restless 0 0 0  Suicidal thoughts 0 0 0  PHQ-9 Score '1 1 1  ' Difficult doing work/chores Not difficult at all Not difficult at all Not difficult at all    Past Medical History:  Diagnosis Date   Allergic rhinitis due to pollen 11/21/2007   Brachial neuritis or radiculitis NOS    Cervicalgia    Concussion    age 44 40s/p accident   Costal chondritis    Depression    Essential and other specified forms of tremor    GERD (gastroesophageal reflux disease)    in past   Lumbago    Occlusion and stenosis of carotid artery without mention of cerebral infarction    [place]izures [place]  [place] 44 63after concussion   Past Surgical History:  Procedure Laterality Date   CATARACT EXTRACTION Right 07/18/14   CATARACT EXTRACTION W/PHACO Left 08/01/2014   Procedure: CATARACT EXTRACTION PHACO AND INTRAOCULAR LENS PLACEMENT ([place]);  Surgeon: Leandrew Koyanagi, MD;  Location: [place];  Service: Ophthalmology;  Laterality: Left;  IVA TOPICAL   COLONOSCOPY  2008   OTHER SURGICAL HISTORY  2002   ear surgery   STAPEDES SURGERY Right 2002   Social History   Socioeconomic History   Marital status: Married    Spouse name: [name] Meenan   Number of children: 2   Years of education: 12   Highest education level:  Master's degree (e.g., MA, MS, MEng, MEd, MSW, MBA)  Occupational History   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[place])    Employer: RETIRED  Tobacco Use   Smoking status: Never   Smokeless tobacco: Never  Vaping Use   Vaping Use: Never used  Substance and Sexual Activity   Alcohol use: No    Alcohol/week: 0.0 standard drinks   Drug use: No   Sexual activity: Not Currently  Other Topics Concern   Not on file  Social History Narrative   Married to [place], has 2 children, has grandchildren   Right handed   Master's plus   He is born in [place], heritage is [place] (Father's side), [place] (Mother's side)      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    Social Determinants of Radio broadcast assistant Strain: Not on file  Food Insecurity: Not on file  Transportation Needs: Not on file  Physical Activity: Not on file  Stress: Not on file  Social Connections: Not on file  Intimate Partner Violence: Not on file   Family History  Problem Relation Age of Onset   Heart failure Mother        enlarged heart    Asthma Father    Current Outpatient Medications on File Prior to Visit  Medication Sig   B Complex Vitamins (VITAMIN B COMPLEX PO) Take 1 tablet by mouth daily.   baclofen (LIORESAL) 10 MG tablet TAKE 0.5-1 TABLETS (5-10 MG TOTAL) BY MOUTH 2 (TWO) TIMES DAILY AS NEEDED FOR MUSCLE SPASMS.   Capsicum-Garlic 166-063 MG CAPS Take 200-300 mg by mouth.   carbidopa-levodopa (SINEMET IR) 25-100 MG tablet Take 2 tablets by mouth 3 (three) times daily.   clotrimazole-betamethasone (LOTRISONE) cream Apply 1-2 times a day for worsening flare dry skin dermatitis of toes/feet, may re-use daily up to 1 week as needed.   fluticasone (FLONASE) 50 MCG/ACT nasal spray SPRAY 2 SPRAYS INTO EACH NOSTRIL EVERY DAY   gabapentin (NEURONTIN) 100 MG capsule 1 capsule in the AM and 2 capsules in evening   HAWTHORNE BERRY PO Take by mouth daily.   ibuprofen (ADVIL,MOTRIN) 200 MG tablet Take 200 mg by mouth  every 6 (six) hours as needed for moderate pain.   ipratropium (ATROVENT) 0.06 % nasal spray PLACE 2 SPRAYS INTO BOTH NOSTRILS 4 (FOUR) TIMES DAILY. FOR UP TO 5-7 DAYS THEN STOP.   L-Methylfolate 15 MG TABS Take 1 tablet (15 mg total) by mouth daily.   Lutein 10 MG TABS Take 1 tablet by mouth 3 (three) times daily.   meloxicam (MOBIC) 15 MG tablet TAKE 1/2 TO 1 TABLETS (7.5-15 MG TOTAL) BY MOUTH DAILY AS NEEDED FOR PAIN.   omega-3 acid ethyl esters (LOVAZA) 1 g capsule Take 1 capsule by mouth daily.   PARoxetine (PAXIL) 20 MG tablet TAKE 1 TABLET BY MOUTH EVERY DAY (WITH THE 40 MG TABLET FOR  60 MG TOTAL)   PARoxetine (PAXIL) 40 MG tablet TAKE 1 TABLET BY MOUTH EVERY DAY (WITH THE 20 MG TABLET FOR 60 MG TOTAL)   pramipexole (MIRAPEX) 0.125 MG tablet Take 0.125 mg by mouth 4 (four) times daily.   PSYLLIUM HUSK PO Take by mouth.   Saw Palmetto, Serenoa repens, 1000 MG CAPS Take 2 capsules by mouth daily.   No current facility-administered medications on file prior to visit.    Review of Systems Per HPI unless specifically indicated above     Objective:    BP (!) 118/55   Pulse 78   Ht '5\' 8"'  (1.727 m)   Wt 148 lb (67.1 kg)   SpO2 99%   BMI 22.50 kg/m   Wt Readings from Last 3 Encounters:  10/01/20 148 lb (67.1 kg)  02/15/20 155 lb (70.3 kg)  09/19/19 155 lb (70.3 kg)    Physical Exam Vitals and nursing note reviewed.  Constitutional:      General: He is not in acute distress.    Appearance: He is well-developed. He is not diaphoretic.     Comments: Well-appearing, comfortable, cooperative  HENT:     Head: Normocephalic and atraumatic.  Eyes:     General:        Right eye: No discharge.        Left eye: No discharge.     Conjunctiva/sclera: Conjunctivae normal.  Neck:     Thyroid: No thyromegaly.  Cardiovascular:     Rate and Rhythm: Normal rate and regular rhythm.     Pulses: Normal pulses.     Heart sounds: Normal heart sounds. No murmur heard. Pulmonary:     Effort:  Pulmonary effort is normal. No respiratory distress.     Breath sounds: Normal breath sounds. No wheezing or rales.  Musculoskeletal:        General: Normal range of motion.     Cervical back: Normal range of motion and neck supple.  Lymphadenopathy:     Cervical: No cervical adenopathy.  Skin:    General: Skin is warm and dry.     Findings: No erythema or rash.  Neurological:     Mental Status: He is alert and oriented to person, place, and time. Mental status is at baseline.     Comments: Some baseline involuntary movements  Psychiatric:        Behavior: Behavior normal.     Comments: Well groomed, good eye contact, normal speech and thoughts   Results for orders placed or performed in visit on 09/20/20  VITAMIN D 25 Hydroxy (Vit-D Deficiency, Fractures)  Result Value Ref Range   Vit D, 25-Hydroxy 44 30 - 100 ng/mL  PSA  Result Value Ref Range   PSA 1.73 < OR = 4.00 ng/mL  Lipid panel  Result Value Ref Range   Cholesterol 134 <200 mg/dL   HDL 57 > OR = 40 mg/dL   Triglycerides 82 <150 mg/dL   LDL Cholesterol (Calc) 61 mg/dL (calc)   Total CHOL/HDL Ratio 2.4 <5.0 (calc)   Non-HDL Cholesterol (Calc) 77 <130 mg/dL (calc)  COMPLETE METABOLIC PANEL WITH GFR  Result Value Ref Range   Glucose, Bld 81 65 - 99 mg/dL   BUN 16 7 - 25 mg/dL   Creat 0.76 0.70 - 1.28 mg/dL   eGFR 92 > OR = 60 mL/min/1.81m   BUN/Creatinine Ratio NOT APPLICABLE 6 - 22 (calc)   Sodium 140 135 - 146 mmol/L   Potassium 4.0 3.5 - 5.3  mmol/L   Chloride 104 98 - 110 mmol/L   CO2 29 20 - 32 mmol/L   Calcium 9.1 8.6 - 10.3 mg/dL   Total Protein 6.7 6.1 - 8.1 g/dL   Albumin 4.2 3.6 - 5.1 g/dL   Globulin 2.5 1.9 - 3.7 g/dL (calc)   AG Ratio 1.7 1.0 - 2.5 (calc)   Total Bilirubin 0.8 0.2 - 1.2 mg/dL   Alkaline phosphatase (APISO) 49 35 - 144 U/L   AST 18 10 - 35 U/L   ALT 22 9 - 46 U/L  CBC with Differential/Platelet  Result Value Ref Range   WBC 6.5 3.8 - 10.8 Thousand/uL   RBC 4.86 4.20 - 5.80  Million/uL   Hemoglobin 15.2 13.2 - 17.1 g/dL   HCT 45.2 38.5 - 50.0 %   MCV 93.0 80.0 - 100.0 fL   MCH 31.3 27.0 - 33.0 pg   MCHC 33.6 32.0 - 36.0 g/dL   RDW 12.2 11.0 - 15.0 %   Platelets 183 140 - 400 Thousand/uL   MPV 10.5 7.5 - 12.5 fL   Neutro Abs 4,297 1,500 - 7,800 cells/uL   Lymphs Abs 1,411 850 - 3,900 cells/uL   Absolute Monocytes 351 200 - 950 cells/uL   Eosinophils Absolute 371 15 - 500 cells/uL   Basophils Absolute 72 0 - 200 cells/uL   Neutrophils Relative % 66.1 %   Total Lymphocyte 21.7 %   Monocytes Relative 5.4 %   Eosinophils Relative 5.7 %   Basophils Relative 1.1 %  Hemoglobin A1c  Result Value Ref Range   Hgb A1c MFr Bld 5.0 <5.7 % of total Hgb   Mean Plasma Glucose 97 mg/dL   eAG (mmol/L) 5.4 mmol/L      Assessment & Plan:   Problem List Items Addressed This Visit     Parkinson's disease ([place])    Stable, chronic problem w/ tremors primarily Followed by [facility] Neurology Dr Kalman Shan On carbidopa-levodopa Has been weaning off mirapex.  Continues to exercise and remain active Follow-up as planned       PAD (peripheral artery disease) (HCC)    Stable bilateral carotid stenosis w/ PAD Declines statin.       Major depression, recurrent, full remission ([place])    Followed by Psychiatry In remission Continue SSRI Paxil therapy       Chronic pain of left knee   BPH without obstruction/lower urinary tract symptoms    Stable chronic BPH without significant symptoms on Saw Palmetto - Last PSA stable at 1.7 - due today for lab - Last DRE 09/03/17 - mild enlarged smooth - No known personal/family history of prostate CA  Plan: Continue supplement Saw Palmetto Follow-up if worsening       Bilateral carotid artery stenosis   Other Visit Diagnoses     Annual physical exam    -  Primary   Chronic left-sided low back pain without sciatica       Hyperlipidemia, unspecified hyperlipidemia type           Updated Health Maintenance information Last  Cologuard 03/2020 Reviewed recent lab results with patient Encouraged improvement to lifestyle with diet and exercise Goal of weight loss  Fatigue Chronic problem Uncertain etiology, likely deconditioning Will plan on checking a testosterone for him if interested in future he requests to do this test, we discussed that low testosterone can gradually reduce in future.  See above A&P with Parkinsons concerns per Neurology involuntary movements chronic problem, suspect this may be contributing to  his fatigue   No orders of the defined types were placed in this encounter.   Follow up plan: Return in about 2 weeks (around 10/15/2020) for 2 week AM Lab only 9am or earlier for Testosterone lab. F/u result can schedule ov if needed.  Future labs within 1 week - gave him dx code to check cost Testoserone, Magnesium labs only  Nobie Putnam, [facility] 10/01/2020, 1:35 PM

## 2020-10-03 ENCOUNTER — Encounter: Payer: Self-pay | Admitting: Psychiatry

## 2020-10-03 ENCOUNTER — Ambulatory Visit (INDEPENDENT_AMBULATORY_CARE_PROVIDER_SITE_OTHER): Payer: Medicare PPO | Admitting: Psychiatry

## 2020-10-03 ENCOUNTER — Other Ambulatory Visit: Payer: Self-pay

## 2020-10-03 DIAGNOSIS — F411 Generalized anxiety disorder: Secondary | ICD-10-CM

## 2020-10-03 DIAGNOSIS — F3342 Major depressive disorder, recurrent, in full remission: Secondary | ICD-10-CM

## 2020-10-03 MED ORDER — PAROXETINE HCL 20 MG PO TABS: ORAL_TABLET | ORAL | 1 refills | Status: DC

## 2020-10-03 MED ORDER — PAROXETINE HCL 40 MG PO TABS: ORAL_TABLET | ORAL | 1 refills | Status: DC

## 2020-10-03 MED ORDER — L-METHYLFOLATE 15 MG PO TABS: 15.0000 mg | ORAL_TABLET | Freq: Every day | ORAL | 3 refills | Status: DC

## 2020-10-03 NOTE — Progress Notes (Signed)
Joseph Arroyo [id number] Jul 13, 1940 80 y.o.  Subjective:   Patient ID:  Joseph Arroyo is a 80 y.o. (DOB 1940/03/20) male.  Chief Complaint:  Chief Complaint  Patient presents with   Follow-up   Depression   Anxiety    Depression        Associated symptoms include no fatigue. Joseph Arroyo presents to the office today for follow-up of major depression and generalized anxiety disorder.    He was last seen December, 2020 & June 2021.  No meds were changed.  Continued paroxetine 60 mg daily plus gabapentin 100 mg twice daily and Mirapex 0.125 mg 3 times daily.   08/2019 appt without med changes noted: Tolerates the meds well.  Wife thinks he's doing very well and he agrees.   No complaints.  2 D's.   Still exercising and has fatigue but can function and enjoys activity. Restarted men's Bible study.  He feels positive and hopeful and useful.  At some point would like to be free of medication.  Does not feel guilty about the meds.    03/19/2020 appt noted:  Seen with wife Joseph Arroyo and took Ivermectin and both had it mild. As far mood concerned he's doing well.  Wife agrees.  Not markedly depressed or anxious.  Feels the suffering of others.  [name] in hospital in [place].  Friend with Covid also. No episodes. Tolerating meds.  No Xanax used.  Attend [employer] in [place]. Usually sleep well but back pain can interfere with sleep. TX for PD and a bit slower per wife. Plan: Try increasing gabapentin to 100 mg AM & 200 mg PM for back pain.   Continue paroxetine 60 mg daily and Deplin 15 mg daily  10/03/2020 appointment with the following noted: Seen with wife, Joseph Arroyo and on increased gabapentin at PM to 200 mg for back pain and sleeps better.   Not depressed.  Anxiety is manageable.   Seeing chiropracter who he likes [name] in [place]. No SE noted with meds.  Perspire heavily. No panic attacks.   Sleep variable and is usually  OK. Still leads men's Bible Study. Uses [employer]  Was 12 years free of medication until this last relapse and yet it took a long time to get relief again.  Wife, Joseph Holler, said overall he's been doing well.  No prn BZ in a couple of years.  Past Psychiatric Medication Trials: Under our care since 2013. Paroxetine 60, duloxetine, fluoxetine, Luvox, venlafaxine, Trintellix, , mirtazapine, gabapentin, Deplin, clonazepam, lorazepam  lamotrigine,  buspirone,  Abilify, lithium with some benefit, Seroquel, Zyprexa,  risperidone, Saphris, doxepin,  pramipexole  Review of Systems:  Review of Systems  Constitutional:  Negative for fatigue.  Cardiovascular:  Negative for palpitations.  Musculoskeletal:  Positive for back pain.  Neurological:  Positive for tremors and weakness. Negative for dizziness.   Medications: I have reviewed the patient's current medications.  Current Outpatient Medications  Medication Sig Dispense Refill   B Complex Vitamins (VITAMIN B COMPLEX PO) Take 1 tablet by mouth daily.     Capsicum-Garlic [id number] MG CAPS Take 200-300 mg by mouth.     carbidopa-levodopa (SINEMET IR) 25-100 MG tablet Take 2 tablets by mouth 3 (three) times daily.     clotrimazole-betamethasone (LOTRISONE) cream Apply 1-2 times a day for worsening flare dry skin dermatitis of toes/feet, may re-use daily up to 1 week as needed. 30 g 1   gabapentin (NEURONTIN) 100 MG capsule 1  capsule in the AM and 2 capsules in evening 270 capsule 1   HAWTHORNE BERRY PO Take by mouth daily.     ibuprofen (ADVIL,MOTRIN) 200 MG tablet Take 200 mg by mouth every 6 (six) hours as needed for moderate pain.     Lutein 10 MG TABS Take 1 tablet by mouth 3 (three) times daily. 1 daily     meloxicam (MOBIC) 15 MG tablet TAKE 1/2 TO 1 TABLETS (7.5-15 MG TOTAL) BY MOUTH DAILY AS NEEDED FOR PAIN. 90 tablet 1   omega-3 acid ethyl esters (LOVAZA) 1 g capsule Take 1 capsule by mouth daily.     pramipexole (MIRAPEX) 0.125 MG tablet Take  0.125 mg by mouth at bedtime. 1 tab at bedtime     PSYLLIUM HUSK PO Take by mouth.     Saw Palmetto, Serenoa repens, 1000 MG CAPS Take 2 capsules by mouth daily.     baclofen (LIORESAL) 10 MG tablet TAKE 0.5-1 TABLETS (5-10 MG TOTAL) BY MOUTH 2 (TWO) TIMES DAILY AS NEEDED FOR MUSCLE SPASMS. (Patient not taking: Reported on 10/03/2020) 30 tablet 1   fluticasone (FLONASE) 50 MCG/ACT nasal spray SPRAY 2 SPRAYS INTO EACH NOSTRIL EVERY DAY (Patient not taking: Reported on 10/03/2020) 16 g 8   ipratropium (ATROVENT) 0.06 % nasal spray PLACE 2 SPRAYS INTO BOTH NOSTRILS 4 (FOUR) TIMES DAILY. FOR UP TO 5-7 DAYS THEN STOP. (Patient not taking: Reported on 10/03/2020) 15 mL 2   L-Methylfolate 15 MG TABS Take 1 tablet (15 mg total) by mouth daily. 90 tablet 3   PARoxetine (PAXIL) 20 MG tablet TAKE 1 TABLET BY MOUTH EVERY DAY (WITH THE 40 MG TABLET FOR 60 MG TOTAL) 90 tablet 1   PARoxetine (PAXIL) 40 MG tablet TAKE 1 TABLET BY MOUTH EVERY DAY (WITH THE 20 MG TABLET FOR 60 MG TOTAL) 90 tablet 1   No current facility-administered medications for this visit.    Medication Side Effects: Fatigue and Other: from Mirapex  esp in AM  Allergies:  Allergies  Allergen Reactions   Prednisone     Hyperactivity    Wheat Bran Diarrhea    Past Medical History:  Diagnosis Date   Allergic rhinitis due to pollen 11/21/2007   Brachial neuritis or radiculitis NOS    Cervicalgia    Concussion    age 84 - s/p accident   Costal chondritis    Depression    Essential and other specified forms of tremor    GERD (gastroesophageal reflux disease)    in past   Lumbago    Occlusion and stenosis of carotid artery without mention of cerebral infarction    Seizures ([place])    age 46 - after concussion    Family History  Problem Relation Age of Onset   Heart failure Mother        enlarged heart    Asthma Father     Social History   Socioeconomic History   Marital status: Married    Spouse name: Joseph Arroyo   Number  of children: 2   Years of education: 12   Highest education level: Master's degree (e.g., MA, MS, MEng, MEd, MSW, MBA)  Occupational History   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[place])    Employer: RETIRED  Tobacco Use   Smoking status: Never   Smokeless tobacco: Never  Vaping Use   Vaping Use: Never used  Substance and Sexual Activity   Alcohol use: No    Alcohol/week: 0.0 standard drinks   Drug use:  No   Sexual activity: Not Currently  Other Topics Concern   Not on file  Social History Narrative   Married to Joseph Holler, has 2 children, has grandchildren   Right handed   Master's plus   He is born in [place], heritage is [place] (Father's side), [place] (Mother's side)      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    Social Determinants of Radio broadcast assistant Strain: Not on file  Food Insecurity: Not on file  Transportation Needs: Not on file  Physical Activity: Not on file  Stress: Not on file  Social Connections: Not on file  Intimate Partner Violence: Not on file    Past Medical History, Surgical history, Social history, and Family history were reviewed and updated as appropriate.   Please see review of systems for further details on the patient's review from today.   Objective:   Physical Exam:  There were no vitals taken for this visit.  Physical Exam Constitutional:      General: He is not in acute distress.    Appearance: He is well-developed.  Musculoskeletal:        General: No deformity.  Neurological:     Mental Status: He is alert and oriented to person, place, and time.     Motor: Tremor present.     Coordination: Coordination normal.  Psychiatric:        Attention and Perception: Attention and perception normal. He does not perceive auditory or visual hallucinations.        Mood and Affect: Mood normal. Mood is not anxious or depressed. Affect is not labile, blunt or angry.        Speech: Speech normal.        Behavior: Behavior normal.         Thought Content: Thought content normal. Thought content does not include homicidal or suicidal ideation. Thought content does not include homicidal or suicidal plan.        Cognition and Memory: Cognition and memory normal.        Judgment: Judgment normal.     Comments: Insight intact.  Talkative. No delusions.     Lab Review:     Component Value Date/Time   NA 140 09/23/2020 0911   NA 143 07/10/2015 0821   K 4.0 09/23/2020 0911   CL 104 09/23/2020 0911   CO2 29 09/23/2020 0911   GLUCOSE 81 09/23/2020 0911   BUN 16 09/23/2020 0911   BUN 13 07/10/2015 0821   CREATININE 0.76 09/23/2020 0911   CALCIUM 9.1 09/23/2020 0911   PROT 6.7 09/23/2020 0911   PROT 6.8 07/10/2015 0821   ALBUMIN 3.6 07/24/2016 1050   ALBUMIN 4.2 07/10/2015 0821   AST 18 09/23/2020 0911   ALT 22 09/23/2020 0911   ALKPHOS 49 07/24/2016 1050   BILITOT 0.8 09/23/2020 0911   BILITOT 0.5 07/10/2015 0821   GFRNONAA 86 09/07/2019 0941   GFRAA 100 09/07/2019 0941       Component Value Date/Time   WBC 6.5 09/23/2020 0911   RBC 4.86 09/23/2020 0911   HGB 15.2 09/23/2020 0911   HGB 15.3 10/23/2014 1237   HCT 45.2 09/23/2020 0911   HCT 45.1 10/23/2014 1237   PLT 183 09/23/2020 0911   PLT 150 10/23/2014 1237   MCV 93.0 09/23/2020 0911   MCV 88 10/23/2014 1237   MCH 31.3 09/23/2020 0911   MCHC 33.6 09/23/2020 0911   RDW 12.2 09/23/2020 0911   RDW 13.3 10/23/2014 1237  LYMPHSABS 1,411 09/23/2020 0911   LYMPHSABS 1.7 10/23/2014 1237   MONOABS 357 07/24/2016 1050   EOSABS 371 09/23/2020 0911   EOSABS 0.3 10/23/2014 1237   BASOSABS 72 09/23/2020 0911   BASOSABS 0.1 10/23/2014 1237    No results found for: POCLITH, LITHIUM   No results found for: PHENYTOIN, PHENOBARB, VALPROATE, CBMZ   .res Assessment: Plan:    Xzayvier was seen today for follow-up, depression and anxiety.  Diagnoses and all orders for this visit:  GAD (generalized anxiety disorder) -     PARoxetine (PAXIL) 20 MG tablet; TAKE 1  TABLET BY MOUTH EVERY DAY (WITH THE 40 MG TABLET FOR 60 MG TOTAL) -     PARoxetine (PAXIL) 40 MG tablet; TAKE 1 TABLET BY MOUTH EVERY DAY (WITH THE 20 MG TABLET FOR 60 MG TOTAL)  Major depression, recurrent, full remission (HCC) -     PARoxetine (PAXIL) 20 MG tablet; TAKE 1 TABLET BY MOUTH EVERY DAY (WITH THE 40 MG TABLET FOR 60 MG TOTAL) -     PARoxetine (PAXIL) 40 MG tablet; TAKE 1 TABLET BY MOUTH EVERY DAY (WITH THE 20 MG TABLET FOR 60 MG TOTAL)  Other orders -     L-Methylfolate 15 MG TABS; Take 1 tablet (15 mg total) by mouth daily.   History of extremely severe TR depression and anxiety with multiple med failures.  Doing well for 2-3 years now.  Encourage acceptance of taking the med and going out with life.    Cont meds.  Bad idea to stop meds.   Strongly rec cont meds DT difficulty getting this last episode under control.  Disc SE.Marland Kitchen     Still works out at gym.  Disc pricing L-methylfolate with [employer]. Continue paroxetine 60 mg daily  Parkiinson's is better with meds.  Option consistently increasing gabapentin to 100 mg AM & 200 mg PM for back pain.   It seems to help.  FU 6 mos.  Lynder Parents, MD, DFAPA   Please see After Visit Summary for patient specific instructions.  Future Appointments  Date Time Provider [place]  05/15/2021  8:30 AM [facility] 1 [facility] None  05/15/2021  9:30 AM Schnier, Dolores Lory, MD [facility] None    No orders of the defined types were placed in this encounter.   -------------------------------

## 2020-11-06 ENCOUNTER — Telehealth: Payer: Self-pay | Admitting: Family Medicine

## 2020-11-06 NOTE — Telephone Encounter (Signed)
Pt states he is in constant pain with his back. Sometimes difficult to stand. Pt states even sitting there is some discomfort.   Pt has gone to chiropractor. The chiropractor suggested needling.  Pt would like to be worked in for appt asap to discuss his options.  Pt states Dr Raliegh Ip is aware he has back pain. First available appt is next week.  Pt states he was hioping this week, due to his pain level.

## 2020-11-06 NOTE — Telephone Encounter (Signed)
Okay thank you  He can take Ibuprofen '200mg'$  x 3 to 4 = 600 to '800mg'$  max dose up to 3 times a day ( every 8 hours) with meal.  For short term pain relief until we talk.  Nobie Putnam, [facility] 11/06/2020, 6:05 PM

## 2020-11-06 NOTE — Telephone Encounter (Signed)
Alright. I am not quite sure how to resolve this issue. We only have 1.5 days of clinic left this week.  We could always do a virtual telephone call briefly if there is any room to work in tomorrow or Friday, would need to look at schedule.   It sounds like he is asking for pain medication, as I am not sure how many remaining options there would be for his pain but we can talk about options.  Nobie Putnam, [facility] 11/06/2020, 4:05 PM

## 2020-11-06 NOTE — Telephone Encounter (Signed)
Ok, I spoke with him and had to double book him for Monday at 4. He wanted me to keep him in mind if there is a cancellation tomorrow which  I said I would. He wants to know if he should increase his pain medication (ibuprofen) in the meantime to see if that help decrease the pain.

## 2020-11-07 NOTE — Telephone Encounter (Signed)
Left a message with the instructions on taking ibuprofen.

## 2020-11-11 ENCOUNTER — Ambulatory Visit: Payer: Medicare PPO | Admitting: Family Medicine

## 2020-11-12 ENCOUNTER — Ambulatory Visit (INDEPENDENT_AMBULATORY_CARE_PROVIDER_SITE_OTHER): Payer: Medicare PPO | Admitting: Family Medicine

## 2020-11-12 ENCOUNTER — Encounter: Payer: Self-pay | Admitting: Family Medicine

## 2020-11-12 ENCOUNTER — Other Ambulatory Visit: Payer: Self-pay

## 2020-11-12 VITALS — Ht 68.0 in | Wt 148.0 lb

## 2020-11-12 DIAGNOSIS — M545 Low back pain, unspecified: Secondary | ICD-10-CM

## 2020-11-12 DIAGNOSIS — M5441 Lumbago with sciatica, right side: Secondary | ICD-10-CM

## 2020-11-12 DIAGNOSIS — G8929 Other chronic pain: Secondary | ICD-10-CM

## 2020-11-12 DIAGNOSIS — M4186 Other forms of scoliosis, lumbar region: Secondary | ICD-10-CM

## 2020-11-12 MED ORDER — BACLOFEN 10 MG PO TABS: 5.0000 mg | ORAL_TABLET | Freq: Two times a day (BID) | ORAL | 1 refills | Status: DC | PRN

## 2020-11-12 NOTE — Patient Instructions (Addendum)
Will repeat X-ray Lumbar when ready, ordered patient can come in for X-ray  Handwritten [facility] PT order written, ready for pick up when arrive for X-ray, for Dry needling.  Continue Ibuprofen as advised '600mg'$  OTC 3-4 times per day  Tylenol Ext Str '1000mg'$  TID  Refill Baclofen 5-'10mg'$  TID PRN caution sedation  Home exercises okay to continue, avoid heavy lifting  If not improving will refer to Orthopedics next.  Please schedule a Follow-up Appointment to: Return if symptoms worsen or fail to improve.  If you have any other questions or concerns, please feel free to call the office or send a message through [place]. You may also schedule an earlier appointment if necessary.  Additionally, you may be receiving a survey about your experience at our office within a few days to 1 week by e-mail or mail. We value your feedback.  Nobie Putnam, DO [place]

## 2020-11-12 NOTE — Progress Notes (Signed)
Virtual Visit via Telephone The purpose of this virtual visit is to provide medical care while limiting exposure to the novel coronavirus (COVID19) for both patient and office staff.  Consent was obtained for phone visit:  Yes.   Answered questions that patient had about telehealth interaction:  Yes.   I discussed the limitations, risks, security and privacy concerns of performing an evaluation and management service by telephone. I also discussed with the patient that there may be a patient responsible charge related to this service. The patient expressed understanding and agreed to proceed.  Patient Location: Home Provider Location: [facility] (Office)  Participants in virtual visit: - Patient: Joseph Arroyo - CMA: Orinda Kenner, CMA - Provider: Dr Parks Ranger  ---------------------------------------------------------------------- Chief Complaint  Patient presents with   Back Pain   Missed apt yesterday due to schedule confusion. Here today for virtual visit.  S: Reviewed CMA documentation. I have called patient and gathered additional HPI as follows:  Chronic Low Back Pain Levoscoliosis Lumbar DDD  1 week ago with severe back pain, he was going to the gym regularly but then scaled back to only x 1 gym visit last week. He was taking Ibuprofen 218m x 3 = 6011mQID Pain worse when stands or sits, especially lower Right back.  [facility], advised to return to PCP.  In past he was on other anti inflammatory in past Meloxicam.  He was interested in pursuing dry needling.  Previously worked with [facility]  Additionally he has issue with his Left Knee meniscus causing pain and flaring up.  Denies any fevers, chills, sweats, body ache, cough, shortness of breath, sinus pain or pressure, headache, abdominal pain, diarrhea  Past Medical History:   Diagnosis Date   Allergic rhinitis due to pollen 11/21/2007   Brachial neuritis or radiculitis NOS    Cervicalgia    Concussion    age 80 s/p accident   Costal chondritis    Depression    Essential and other specified forms of tremor    GERD (gastroesophageal reflux disease)    in past   Lumbago    Occlusion and stenosis of carotid artery without mention of cerebral infarction    [place]   [place] 80 [place]   Social History   Tobacco Use   Smoking status: Never   Smokeless tobacco: Never  Vaping Use   Vaping Use: Never used  Substance Use Topics   Alcohol use: No    Alcohol/week: 0.0 standard drinks   Drug use: No    Current Outpatient Medications:    B Complex Vitamins (VITAMIN B COMPLEX PO), Take 1 tablet by mouth daily., Disp: , Rfl:    Capsicum-Garlic 20948-546G CAPS, Take 200-300 mg by mouth., Disp: , Rfl:    carbidopa-levodopa (SINEMET IR) 25-100 MG tablet, Take 2 tablets by mouth 3 (three) times daily., Disp: , Rfl:    clotrimazole-betamethasone (LOTRISONE) cream, Apply 1-2 times a day for worsening flare dry skin dermatitis of toes/feet, may re-use daily up to 1 week as needed., Disp: 30 g, Rfl: 1   gabapentin (NEURONTIN) 100 MG capsule, 1 capsule in the AM and 2 capsules in evening, Disp: 270 capsule, Rfl: 1   HAWTHORNE BERRY PO, Take by mouth daily., Disp: , Rfl:    ibuprofen (ADVIL,MOTRIN) 200 MG tablet, Take 200 mg by mouth every 6 (six) hours as needed  for moderate pain., Disp: , Rfl:    L-Methylfolate 15 MG TABS, Take 1 tablet (15 mg total) by mouth daily., Disp: 90 tablet, Rfl: 3   Lutein 10 MG TABS, Take 1 tablet by mouth 3 (three) times daily. 1 daily, Disp: , Rfl:    meloxicam (MOBIC) 15 MG tablet, TAKE 1/2 TO 1 TABLETS (7.5-15 MG TOTAL) BY MOUTH DAILY AS NEEDED FOR PAIN., Disp: 90 tablet, Rfl: 1   omega-3 acid ethyl esters (LOVAZA) 1 g capsule, Take 1 capsule by mouth daily., Disp: , Rfl:    PARoxetine (PAXIL) 20 MG tablet, TAKE 1 TABLET  BY MOUTH EVERY DAY (WITH THE 40 MG TABLET FOR 60 MG TOTAL), Disp: 90 tablet, Rfl: 1   PARoxetine (PAXIL) 40 MG tablet, TAKE 1 TABLET BY MOUTH EVERY DAY (WITH THE 20 MG TABLET FOR 60 MG TOTAL), Disp: 90 tablet, Rfl: 1   pramipexole (MIRAPEX) 0.125 MG tablet, Take 0.125 mg by mouth at bedtime. 1 tab at bedtime, Disp: , Rfl:    PSYLLIUM HUSK PO, Take by mouth., Disp: , Rfl:    Saw [place], Serenoa repens, 1000 MG CAPS, Take 2 capsules by mouth daily., Disp: , Rfl:    baclofen (LIORESAL) 10 MG tablet, Take 0.5-1 tablets (5-10 mg total) by mouth 2 (two) times daily as needed for muscle spasms., Disp: 30 tablet, Rfl: 1   fluticasone (FLONASE) 50 MCG/ACT nasal spray, SPRAY 2 SPRAYS INTO EACH NOSTRIL EVERY DAY (Patient not taking: No sig reported), Disp: 16 g, Rfl: 8   ipratropium (ATROVENT) 0.06 % nasal spray, PLACE 2 SPRAYS INTO BOTH NOSTRILS 4 (FOUR) TIMES DAILY. FOR UP TO 5-7 DAYS THEN STOP. (Patient not taking: No sig reported), Disp: 15 mL, Rfl: 2  Depression screen [facility] 2/9 10/01/2020 09/19/2019 09/07/2019  Decreased Interest 0 0 0  Down, Depressed, Hopeless 0 0 0  PHQ - 2 Score 0 0 0  Altered sleeping 0 0 0  Tired, decreased energy '1 1 1  ' Change in appetite 0 0 0  Feeling bad or failure about yourself  0 0 0  Trouble concentrating 0 0 0  Moving slowly or fidgety/restless 0 0 0  Suicidal thoughts 0 0 0  PHQ-9 Score '1 1 1  ' Difficult doing work/chores Not difficult at all Not difficult at all Not difficult at all    GAD 7 : Generalized Anxiety Score 10/01/2020  Nervous, Anxious, on Edge 0  Control/stop worrying 0  Worry too much - different things 0  Trouble relaxing 0  Restless 0  Easily annoyed or irritable 0  Afraid - awful might happen 0  Total GAD 7 Score 0  Anxiety Difficulty Not difficult at all    -------------------------------------------------------------------------- O: No physical exam performed due to remote telephone encounter.  Lab results reviewed.  I have personally  reviewed the radiology report from 03/07/19 X-ray Lumbar.  CLINICAL DATA:  Chronic low back pain   EXAM: LUMBAR SPINE - COMPLETE 4+ VIEW   COMPARISON:  May 02, 2012   FINDINGS: Frontal, lateral, spot lumbosacral lateral, and bilateral oblique views were obtained. There are 5 non-rib-bearing lumbar type vertebral bodies. There is lumbar levoscoliosis with rotatory component. Degree of scoliosis has increased compared to the previous study. There is no appreciable fracture. There is 4 mm of retrolisthesis of L2 on L3. There is 3 mm of retrolisthesis of L3 on L4. There is 3 mm of retrolisthesis of L5 on S1. There is moderate disc space narrowing at L1-2 and L2-3. There  is milder disc space narrowing at L3-4. There is moderately severe disc space narrowing at L5-S1. Facet osteoarthritic change at L4-5 and L5-S1 bilaterally and at L3-4 on the right.   IMPRESSION: Increase in levorotoscoliosis compared to the previous study. Multilevel osteoarthritic change, overall progressed from prior study. Areas of mild spondylolisthesis at several levels, likely due to the underlying spondylosis. No fracture evident.     Electronically Signed   By: Lowella Grip III M.D.   On: 03/07/2019 16:00  Recent Results (from the past 2160 hour(s))  VITAMIN D 25 Hydroxy (Vit-D Deficiency, Fractures)     Status: None   Collection Time: 09/23/20  9:11 AM  Result Value Ref Range   Vit D, 25-Hydroxy 44 30 - 100 ng/mL    Comment: Vitamin D Status         25-OH Vitamin D: . Deficiency:                    <20 ng/mL Insufficiency:             20 - 29 ng/mL Optimal:                 > or = 30 ng/mL . For 25-OH Vitamin D testing on patients on  D2-supplementation and patients for whom quantitation  of D2 and D3 fractions is required, the QuestAssureD(TM) 25-OH VIT D, (D2,D3), LC/MS/MS is recommended: order  code [phone] (patients >42yr). See Note 1 . Note 1 . For additional information, please refer  to  http://education.QuestDiagnostics.com/faq/FAQ199  (This link is being provided for informational/ educational purposes only.)   PSA     Status: None   Collection Time: 09/23/20  9:11 AM  Result Value Ref Range   PSA 1.73 < OR = 4.00 ng/mL    Comment: The total PSA value from this assay system is  standardized against the WHO standard. The test  result will be approximately 20% lower when compared  to the equimolar-standardized total PSA (Beckman  Coulter). Comparison of serial PSA results should be  interpreted with this fact in mind. . This test was performed using the Siemens  chemiluminescent method. Values obtained from  different assay methods cannot be used interchangeably. PSA levels, regardless of value, should not be interpreted as absolute evidence of the presence or absence of disease.   Lipid panel     Status: None   Collection Time: 09/23/20  9:11 AM  Result Value Ref Range   Cholesterol 134 <200 mg/dL   HDL 57 > OR = 40 mg/dL   Triglycerides 82 <150 mg/dL   LDL Cholesterol (Calc) 61 mg/dL (calc)    Comment: Reference range: <100 . Desirable range <100 mg/dL for primary prevention;   <70 mg/dL for patients with CHD or diabetic patients  with > or = 2 CHD risk factors. .Marland KitchenLDL-C is now calculated using the Martin-Hopkins  calculation, which is a validated novel method providing  better accuracy than the Friedewald equation in the  estimation of LDL-C.  MCresenciano Genreet al. JAnnamaria Helling 24401;027(25: 2061-2068  (http://education.Quest[facility].com/faq/FAQ164)    Total CHOL/HDL Ratio 2.4 <5.0 (calc)   Non-HDL Cholesterol (Calc) 77 <130 mg/dL (calc)    Comment: For patients with diabetes plus 1 major ASCVD risk  factor, treating to a non-HDL-C goal of <100 mg/dL  (LDL-C of <70 mg/dL) is considered a therapeutic  option.   COMPLETE METABOLIC PANEL WITH GFR     Status: None   Collection Time: 09/23/20  9:11 AM  Result Value Ref Range   Glucose, Bld 81 65 - 99  mg/dL    Comment: .            Fasting reference interval .    BUN 16 7 - 25 mg/dL   Creat 0.76 0.70 - 1.28 mg/dL   eGFR 92 > OR = 60 mL/min/1.48m    Comment: The eGFR is based on the CKD-EPI 2021 equation. To calculate  the new eGFR from a previous Creatinine or Cystatin C result, go to https://www.kidney.org/professionals/ kdoqi/gfr%5Fcalculator    BUN/Creatinine Ratio NOT APPLICABLE 6 - 22 (calc)   Sodium 140 135 - 146 mmol/L   Potassium 4.0 3.5 - 5.3 mmol/L   Chloride 104 98 - 110 mmol/L   CO2 29 20 - 32 mmol/L   Calcium 9.1 8.6 - 10.3 mg/dL   Total Protein 6.7 6.1 - 8.1 g/dL   Albumin 4.2 3.6 - 5.1 g/dL   Globulin 2.5 1.9 - 3.7 g/dL (calc)   AG Ratio 1.7 1.0 - 2.5 (calc)   Total Bilirubin 0.8 0.2 - 1.2 mg/dL   Alkaline phosphatase (APISO) 49 35 - 144 U/L   AST 18 10 - 35 U/L   ALT 22 9 - 46 U/L  CBC with Differential/Platelet     Status: None   Collection Time: 09/23/20  9:11 AM  Result Value Ref Range   WBC 6.5 3.8 - 10.8 Thousand/uL   RBC 4.86 4.20 - 5.80 Million/uL   Hemoglobin 15.2 13.2 - 17.1 g/dL   HCT 45.2 38.5 - 50.0 %   MCV 93.0 80.0 - 100.0 fL   MCH 31.3 27.0 - 33.0 pg   MCHC 33.6 32.0 - 36.0 g/dL   RDW 12.2 11.0 - 15.0 %   Platelets 183 140 - 400 Thousand/uL   MPV 10.5 7.5 - 12.5 fL   Neutro Abs 4,297 1,500 - 7,800 cells/uL   Lymphs Abs 1,411 850 - 3,900 cells/uL   Absolute Monocytes 351 200 - 950 cells/uL   Eosinophils Absolute 371 15 - 500 cells/uL   Basophils Absolute 72 0 - 200 cells/uL   Neutrophils Relative % 66.1 %   Total Lymphocyte 21.7 %   Monocytes Relative 5.4 %   Eosinophils Relative 5.7 %   Basophils Relative 1.1 %  Hemoglobin A1c     Status: None   Collection Time: 09/23/20  9:11 AM  Result Value Ref Range   Hgb A1c MFr Bld 5.0 <5.7 % of total Hgb    Comment: For the purpose of screening for the presence of diabetes: . <5.7%       Consistent with the absence of diabetes 5.7-6.4%    Consistent with increased risk for diabetes              (prediabetes) > or =6.5%  Consistent with diabetes . This assay result is consistent with a decreased risk of diabetes. . Currently, no consensus exists regarding use of hemoglobin A1c for diagnosis of diabetes in children. . According to American Diabetes Association (ADA) guidelines, hemoglobin A1c <7.0% represents optimal control in non-pregnant diabetic patients. Different metrics may apply to specific patient populations.  Standards of Medical Care in Diabetes(ADA). .    Mean Plasma Glucose 97 mg/dL   eAG (mmol/L) 5.4 mmol/L    -------------------------------------------------------------------------- A&P:  Problem List Items Addressed This Visit   None Visit Diagnoses     Chronic left-sided low back pain without sciatica    -  Primary   Relevant Medications  baclofen (LIORESAL) 10 MG tablet   Other Relevant Orders   DG Lumbar Spine Complete   Levoscoliosis of lumbar spine       Acute right-sided low back pain with right-sided sciatica       Relevant Medications   baclofen (LIORESAL) 10 MG tablet      Clinically with chronic low back pain Setting of levoscoliosis, DDD Lumbar with progression see x-ray 03/2019 above  Worsening acute on chronic back pain flare  Some response to NSAID therapy and lifestyle changes exercises  Will repeat X-ray Lumbar when ready, ordered patient can come in for X-ray  Handwritten [facility] PT order written, ready for pick up when arrive for X-ray, for Dry needling.  Continue Ibuprofen as advised 658m OTC 3-4 times per day  Tylenol Ext Str 10073mTID  Refill Baclofen 5-1039mID PRN caution sedation  Home exercises okay to continue, avoid heavy lifting  If not improving will refer to Orthopedics next.  Meds ordered this encounter  Medications   baclofen (LIORESAL) 10 MG tablet    Sig: Take 0.5-1 tablets (5-10 mg total) by mouth 2 (two) times daily as needed for muscle spasms.    Dispense:  30 tablet     Refill:  1     Follow-up: - Return as needed  Patient verbalizes understanding with the above medical recommendations including the limitation of remote medical advice.  Specific follow-up and call-back criteria were given for patient to follow-up or seek medical care more urgently if needed.   - Time spent in direct consultation with patient on phone: 20 minutes   AleNobie PutnamO [place] 11/12/2020, 4:48 PM

## 2020-11-13 ENCOUNTER — Ambulatory Visit
Admission: RE | Admit: 2020-11-13 | Discharge: 2020-11-13 | Disposition: A | Discharge status: Home / Self Care | Payer: Medicare PPO | Source: Home / Self Care | Attending: Family Medicine | Admitting: Family Medicine

## 2020-11-13 ENCOUNTER — Ambulatory Visit
Admission: RE | Admit: 2020-11-13 | Discharge: 2020-11-13 | Disposition: A | Discharge status: Home / Self Care | Payer: Medicare PPO | Source: Ambulatory Visit | Attending: Family Medicine | Admitting: Family Medicine

## 2020-11-13 DIAGNOSIS — G8929 Other chronic pain: Secondary | ICD-10-CM | POA: Diagnosis not present

## 2020-11-13 DIAGNOSIS — M545 Low back pain, unspecified: Secondary | ICD-10-CM | POA: Insufficient documentation

## 2020-11-18 ENCOUNTER — Telehealth: Payer: Medicare PPO | Admitting: Family Medicine

## 2020-11-18 DIAGNOSIS — M4146 Neuromuscular scoliosis, lumbar region: Secondary | ICD-10-CM | POA: Diagnosis not present

## 2020-11-18 DIAGNOSIS — R262 Difficulty in walking, not elsewhere classified: Secondary | ICD-10-CM | POA: Diagnosis not present

## 2020-11-20 ENCOUNTER — Other Ambulatory Visit: Payer: Self-pay | Admitting: Family Medicine

## 2020-11-20 DIAGNOSIS — M5441 Lumbago with sciatica, right side: Secondary | ICD-10-CM

## 2020-11-20 DIAGNOSIS — M4146 Neuromuscular scoliosis, lumbar region: Secondary | ICD-10-CM | POA: Diagnosis not present

## 2020-11-20 DIAGNOSIS — R262 Difficulty in walking, not elsewhere classified: Secondary | ICD-10-CM | POA: Diagnosis not present

## 2020-11-20 NOTE — Telephone Encounter (Signed)
Requested medication (s) are due for refill today - no  Requested medication (s) are on the active medication list -yes  Future visit scheduled -no  Last refill: 11/12/20 #30 1RF  Notes to clinic: Request RF: non delegated Rx  Requested Prescriptions  Pending Prescriptions Disp Refills   baclofen (LIORESAL) 10 MG tablet [Pharmacy Med Name: BACLOFEN 10 MG TABLET] 30 tablet 1    Sig: Take 0.5-1 tablets (5-10 mg total) by mouth 2 (two) times daily as needed for muscle spasms.     Not Delegated - Analgesics:  Muscle Relaxants Failed - 11/20/2020  8:30 AM      Failed - This refill cannot be delegated      Passed - Valid encounter within last 6 months    Recent Outpatient Visits           1 week ago Chronic left-sided low back pain without sciatica   [place], DO   1 month ago Annual physical exam   [facility] Olin Hauser, DO   9 months ago Colon cancer screening   [facility] Olin Hauser, DO   1 year ago COVID-19 virus infection   [place], DO   1 year ago Annual physical exam   [place], DO                 Requested Prescriptions  Pending Prescriptions Disp Refills   baclofen (LIORESAL) 10 MG tablet [Pharmacy Med Name: BACLOFEN 10 MG TABLET] 30 tablet 1    Sig: Take 0.5-1 tablets (5-10 mg total) by mouth 2 (two) times daily as needed for muscle spasms.     Not Delegated - Analgesics:  Muscle Relaxants Failed - 11/20/2020  8:30 AM      Failed - This refill cannot be delegated      Passed - Valid encounter within last 6 months    Recent Outpatient Visits           1 week ago Chronic left-sided low back pain without sciatica   [place], DO   1 month ago Annual physical exam   [facility] Olin Hauser, DO   9  months ago Colon cancer screening   [facility] Olin Hauser, DO   1 year ago COVID-19 virus infection   [place], DO   1 year ago Annual physical exam   [facility] Olin Hauser, DO

## 2020-11-27 DIAGNOSIS — R262 Difficulty in walking, not elsewhere classified: Secondary | ICD-10-CM | POA: Diagnosis not present

## 2020-11-27 DIAGNOSIS — M4146 Neuromuscular scoliosis, lumbar region: Secondary | ICD-10-CM | POA: Diagnosis not present

## 2020-11-29 DIAGNOSIS — M4146 Neuromuscular scoliosis, lumbar region: Secondary | ICD-10-CM | POA: Diagnosis not present

## 2020-11-29 DIAGNOSIS — R262 Difficulty in walking, not elsewhere classified: Secondary | ICD-10-CM | POA: Diagnosis not present

## 2020-12-04 DIAGNOSIS — R262 Difficulty in walking, not elsewhere classified: Secondary | ICD-10-CM | POA: Diagnosis not present

## 2020-12-04 DIAGNOSIS — M4146 Neuromuscular scoliosis, lumbar region: Secondary | ICD-10-CM | POA: Diagnosis not present

## 2020-12-06 DIAGNOSIS — R262 Difficulty in walking, not elsewhere classified: Secondary | ICD-10-CM | POA: Diagnosis not present

## 2020-12-06 DIAGNOSIS — M4146 Neuromuscular scoliosis, lumbar region: Secondary | ICD-10-CM | POA: Diagnosis not present

## 2020-12-11 DIAGNOSIS — M4146 Neuromuscular scoliosis, lumbar region: Secondary | ICD-10-CM | POA: Diagnosis not present

## 2020-12-11 DIAGNOSIS — R262 Difficulty in walking, not elsewhere classified: Secondary | ICD-10-CM | POA: Diagnosis not present

## 2020-12-13 DIAGNOSIS — R262 Difficulty in walking, not elsewhere classified: Secondary | ICD-10-CM | POA: Diagnosis not present

## 2020-12-13 DIAGNOSIS — M4146 Neuromuscular scoliosis, lumbar region: Secondary | ICD-10-CM | POA: Diagnosis not present

## 2020-12-18 ENCOUNTER — Other Ambulatory Visit: Payer: Self-pay | Admitting: Family Medicine

## 2020-12-18 DIAGNOSIS — M5441 Lumbago with sciatica, right side: Secondary | ICD-10-CM

## 2020-12-18 DIAGNOSIS — M4146 Neuromuscular scoliosis, lumbar region: Secondary | ICD-10-CM | POA: Diagnosis not present

## 2020-12-18 DIAGNOSIS — R262 Difficulty in walking, not elsewhere classified: Secondary | ICD-10-CM | POA: Diagnosis not present

## 2020-12-18 NOTE — Telephone Encounter (Signed)
Requested medications are due for refill today.  yes  Requested medications are on the active medications list.  yes  Last refill. 11/20/2020  Future visit scheduled.   no  Notes to clinic.  Medication not delegated.

## 2020-12-20 DIAGNOSIS — M4146 Neuromuscular scoliosis, lumbar region: Secondary | ICD-10-CM | POA: Diagnosis not present

## 2020-12-20 DIAGNOSIS — R262 Difficulty in walking, not elsewhere classified: Secondary | ICD-10-CM | POA: Diagnosis not present

## 2020-12-25 DIAGNOSIS — M4146 Neuromuscular scoliosis, lumbar region: Secondary | ICD-10-CM | POA: Diagnosis not present

## 2020-12-25 DIAGNOSIS — R262 Difficulty in walking, not elsewhere classified: Secondary | ICD-10-CM | POA: Diagnosis not present

## 2020-12-27 DIAGNOSIS — R262 Difficulty in walking, not elsewhere classified: Secondary | ICD-10-CM | POA: Diagnosis not present

## 2020-12-27 DIAGNOSIS — M4146 Neuromuscular scoliosis, lumbar region: Secondary | ICD-10-CM | POA: Diagnosis not present

## 2020-12-31 ENCOUNTER — Other Ambulatory Visit: Payer: Self-pay | Admitting: Psychiatry

## 2020-12-31 DIAGNOSIS — F411 Generalized anxiety disorder: Secondary | ICD-10-CM

## 2020-12-31 DIAGNOSIS — M4146 Neuromuscular scoliosis, lumbar region: Secondary | ICD-10-CM | POA: Diagnosis not present

## 2020-12-31 DIAGNOSIS — R262 Difficulty in walking, not elsewhere classified: Secondary | ICD-10-CM | POA: Diagnosis not present

## 2021-01-08 DIAGNOSIS — R262 Difficulty in walking, not elsewhere classified: Secondary | ICD-10-CM | POA: Diagnosis not present

## 2021-01-08 DIAGNOSIS — M4146 Neuromuscular scoliosis, lumbar region: Secondary | ICD-10-CM | POA: Diagnosis not present

## 2021-01-10 DIAGNOSIS — M4146 Neuromuscular scoliosis, lumbar region: Secondary | ICD-10-CM | POA: Diagnosis not present

## 2021-01-10 DIAGNOSIS — R262 Difficulty in walking, not elsewhere classified: Secondary | ICD-10-CM | POA: Diagnosis not present

## 2021-01-11 ENCOUNTER — Other Ambulatory Visit: Payer: Self-pay | Admitting: Family Medicine

## 2021-01-11 DIAGNOSIS — M5441 Lumbago with sciatica, right side: Secondary | ICD-10-CM

## 2021-01-11 NOTE — Telephone Encounter (Signed)
Requested medication (s) are due for refill today: no  Requested medication (s) are on the active medication list: yes   Last refill:  12/19/20  Future visit scheduled: no  Notes to clinic:  Unable to refill per protocol, cannot delegate.      Requested Prescriptions  Pending Prescriptions Disp Refills   baclofen (LIORESAL) 10 MG tablet [Pharmacy Med Name: BACLOFEN 10 MG TABLET] 60 tablet 1    Sig: TAKE 1/2-1 TABLET BY MOUTH TWICE A DAY AS NEEDED FOR MUSCLE SPASMS     Not Delegated - Analgesics:  Muscle Relaxants Failed - 01/11/2021 11:32 AM      Failed - This refill cannot be delegated      Passed - Valid encounter within last 6 months    Recent Outpatient Visits           2 months ago Chronic left-sided low back pain without sciatica   [place], DO   3 months ago Annual physical exam   [facility] Olin Hauser, DO   11 months ago Colon cancer screening   [facility] Olin Hauser, DO   1 year ago COVID-19 virus infection   [place], DO   1 year ago Annual physical exam   [place], Devonne Doughty, DO

## 2021-01-13 DIAGNOSIS — R262 Difficulty in walking, not elsewhere classified: Secondary | ICD-10-CM | POA: Diagnosis not present

## 2021-01-13 DIAGNOSIS — M4146 Neuromuscular scoliosis, lumbar region: Secondary | ICD-10-CM | POA: Diagnosis not present

## 2021-01-15 DIAGNOSIS — G20 Parkinson's disease: Secondary | ICD-10-CM | POA: Diagnosis not present

## 2021-02-07 ENCOUNTER — Ambulatory Visit (INDEPENDENT_AMBULATORY_CARE_PROVIDER_SITE_OTHER): Payer: Medicare PPO | Admitting: Family Medicine

## 2021-02-07 ENCOUNTER — Encounter: Payer: Self-pay | Admitting: Family Medicine

## 2021-02-07 VITALS — Ht 68.0 in | Wt 148.0 lb

## 2021-02-07 DIAGNOSIS — M5136 Other intervertebral disc degeneration, lumbar region: Secondary | ICD-10-CM

## 2021-02-07 DIAGNOSIS — G8929 Other chronic pain: Secondary | ICD-10-CM

## 2021-02-07 DIAGNOSIS — M4186 Other forms of scoliosis, lumbar region: Secondary | ICD-10-CM

## 2021-02-07 DIAGNOSIS — M545 Low back pain, unspecified: Secondary | ICD-10-CM

## 2021-02-07 DIAGNOSIS — R197 Diarrhea, unspecified: Secondary | ICD-10-CM | POA: Diagnosis not present

## 2021-02-07 NOTE — Patient Instructions (Addendum)
Thank you for coming to the office today.  OTC Peppermint Oil (Triple Coated Capsule) 180mg  take one 3 times daily to reduce diarrhea  May need to change Paxil in future  Imodium PRN  Referral to Pain Management [facility]  Please schedule a Follow-up Appointment to: Return if symptoms worsen or fail to improve.  If you have any other questions or concerns, please feel free to call the office or send a message through [place]. You may also schedule an earlier appointment if necessary.  Additionally, you may be receiving a survey about your experience at our office within a few days to 1 week by e-mail or mail. We value your feedback.  Nobie Putnam, DO [place]

## 2021-02-07 NOTE — Progress Notes (Signed)
Virtual Visit via Telephone The purpose of this virtual visit is to provide medical care while limiting exposure to the novel coronavirus (COVID19) for both patient and office staff.  Consent was obtained for phone visit:  Yes.   Answered questions that patient had about telehealth interaction:  Yes.   I discussed the limitations, risks, security and privacy concerns of performing an evaluation and management service by telephone. I also discussed with the patient that there may be a patient responsible charge related to this service. The patient expressed understanding and agreed to proceed.  Patient Location: Home Provider Location: [facility] (Office)  Participants in virtual visit: - Patient: Joseph Arroyo - CMA: Orinda Kenner, CMA - Provider: Dr Parks Ranger  ---------------------------------------------------------------------- Chief Complaint  Patient presents with   Back Pain   Diarrhea   Sciatica    S: Reviewed CMA documentation. I have called patient and gathered additional HPI as follows:  Acute on Chronic Low Back Pain, Left sided sciatica  Last visit with me 10/2020, for same problem. He was referred to [facility] PT made some progress but not significant improvement. He also went to Chiropractor before, did some dry needling. Overall mixed review with some improve but not adequate resolution or improvement.  He is taking OTC meds PRN for pain - Ibuprofen 3-4 pills 200mg  each few times per day and Tylenol PRN.  Tried Baclofen, does not like to take medication. Seemed to help with half pill.  He has friends and family suggesting a pain clinic referral. He is interested in [facility] injection  Additional complaint today  Diarrhea He has had recent several weeks diarrhea loose stool, no other symptoms or sign of infection. No blood or dark stool. He takes Mirapex, paxil no prior problem with these meds, his mirapex was lowered but these are known to  cause diarrhea.   Denies any fevers, chills, sweats, body ache, cough, shortness of breath, sinus pain or pressure, headache, abdominal pain  Past Medical History:  Diagnosis Date   Allergic rhinitis due to pollen 11/21/2007   Brachial neuritis or radiculitis NOS    Cervicalgia    Concussion    age 27 - s/p accident   Costal chondritis    Depression    Essential and other specified forms of tremor    GERD (gastroesophageal reflux disease)    in past   Lumbago    Occlusion and stenosis of carotid artery without mention of cerebral infarction    Seizures ([place])    age 87 - after concussion   Social History   Tobacco Use   Smoking status: Never   Smokeless tobacco: Never  Vaping Use   Vaping Use: Never used  Substance Use Topics   Alcohol use: No    Alcohol/week: 0.0 standard drinks   Drug use: No    Current Outpatient Medications:    B Complex Vitamins (VITAMIN B COMPLEX PO), Take 1 tablet by mouth daily., Disp: , Rfl:    baclofen (LIORESAL) 10 MG tablet, TAKE 1/2-1 TABLET BY MOUTH TWICE A DAY AS NEEDED FOR MUSCLE SPASMS, Disp: 60 tablet, Rfl: 1   Capsicum-Garlic 811-914 MG CAPS, Take 200-300 mg by mouth., Disp: , Rfl:    carbidopa-levodopa (SINEMET IR) 25-100 MG tablet, Take 2 tablets by mouth 3 (three) times daily., Disp: , Rfl:    clotrimazole-betamethasone (LOTRISONE) cream, Apply 1-2 times a day for worsening flare dry skin dermatitis of toes/feet, may re-use daily up to 1 week as needed., Disp:  30 g, Rfl: 1   gabapentin (NEURONTIN) 100 MG capsule, 1 CAPSULE BY MOUTH IN THE AM AND 2 CAPSULES IN EVENING, Disp: 270 capsule, Rfl: 1   HAWTHORNE BERRY PO, Take by mouth daily., Disp: , Rfl:    ibuprofen (ADVIL,MOTRIN) 200 MG tablet, Take 200 mg by mouth every 6 (six) hours as needed for moderate pain., Disp: , Rfl:    L-Methylfolate 15 MG TABS, Take 1 tablet (15 mg total) by mouth daily., Disp: 90 tablet, Rfl: 3   Lutein 10 MG TABS, Take 1 tablet by mouth 3 (three) times  daily. 1 daily, Disp: , Rfl:    meloxicam (MOBIC) 15 MG tablet, TAKE 1/2 TO 1 TABLETS (7.5-15 MG TOTAL) BY MOUTH DAILY AS NEEDED FOR PAIN., Disp: 90 tablet, Rfl: 1   omega-3 acid ethyl esters (LOVAZA) 1 g capsule, Take 1 capsule by mouth daily., Disp: , Rfl:    PARoxetine (PAXIL) 20 MG tablet, TAKE 1 TABLET BY MOUTH EVERY DAY (WITH THE 40 MG TABLET FOR 60 MG TOTAL), Disp: 90 tablet, Rfl: 1   PARoxetine (PAXIL) 40 MG tablet, TAKE 1 TABLET BY MOUTH EVERY DAY (WITH THE 20 MG TABLET FOR 60 MG TOTAL), Disp: 90 tablet, Rfl: 1   pramipexole (MIRAPEX) 0.125 MG tablet, Take 0.125 mg by mouth at bedtime. 1 tab at bedtime, Disp: , Rfl:    PSYLLIUM HUSK PO, Take by mouth., Disp: , Rfl:    Saw Palmetto, Serenoa repens, 1000 MG CAPS, Take 2 capsules by mouth daily., Disp: , Rfl:    fluticasone (FLONASE) 50 MCG/ACT nasal spray, SPRAY 2 SPRAYS INTO EACH NOSTRIL EVERY DAY (Patient not taking: Reported on 10/03/2020), Disp: 16 g, Rfl: 8   ipratropium (ATROVENT) 0.06 % nasal spray, PLACE 2 SPRAYS INTO BOTH NOSTRILS 4 (FOUR) TIMES DAILY. FOR UP TO 5-7 DAYS THEN STOP. (Patient not taking: Reported on 10/03/2020), Disp: 15 mL, Rfl: 2  Depression screen [facility] 2/9 10/01/2020 09/19/2019 09/07/2019  Decreased Interest 0 0 0  Down, Depressed, Hopeless 0 0 0  PHQ - 2 Score 0 0 0  Altered sleeping 0 0 0  Tired, decreased energy 1 1 1   Change in appetite 0 0 0  Feeling bad or failure about yourself  0 0 0  Trouble concentrating 0 0 0  Moving slowly or fidgety/restless 0 0 0  Suicidal thoughts 0 0 0  PHQ-9 Score 1 1 1   Difficult doing work/chores Not difficult at all Not difficult at all Not difficult at all    GAD 7 : Generalized Anxiety Score 10/01/2020  Nervous, Anxious, on Edge 0  Control/stop worrying 0  Worry too much - different things 0  Trouble relaxing 0  Restless 0  Easily annoyed or irritable 0  Afraid - awful might happen 0  Total GAD 7 Score 0  Anxiety Difficulty Not difficult at all     -------------------------------------------------------------------------- O: No physical exam performed due to remote telephone encounter.  Lab results reviewed.  I have personally reviewed the radiology report from 11/13/20 on XRay Lumbar spine.  CLINICAL DATA:  Scoliosis, osteo arthritis, worsening back pain, no known injury   EXAM: LUMBAR SPINE - COMPLETE 4+ VIEW   COMPARISON:  03/07/2019   FINDINGS: No fracture or dislocation of the lumbar spine. Levoscoliosis, apex L3-L4, with moderate to severe associated disc and facet degenerative change throughout. Degenerative findings are slightly worsened compared to prior radiographs, particularly at L3-L4. Probable small nonobstructive inferior pole right renal calculi. Nonobstructive pattern of overlying bowel  gas.   IMPRESSION: 1.  No fracture or dislocation of the lumbar spine.   2. Levoscoliosis, apex L3-L4, with moderate to severe associated disc and facet degenerative change throughout. Degenerative findings are slightly worsened compared to prior radiographs, particularly at L3-L4.   3.  Probable small nonobstructive inferior pole right renal calculi.     Electronically Signed   By: Eddie Candle M.D.   On: 11/14/2020 09:39   No results found for this or any previous visit (from the past 2160 hour(s)).  -------------------------------------------------------------------------- A&P:  Problem List Items Addressed This Visit   None Visit Diagnoses     Chronic left-sided low back pain without sciatica    -  Primary   Relevant Orders   Ambulatory referral to Pain Clinic   Levoscoliosis of lumbar spine       Relevant Orders   Ambulatory referral to Pain Clinic   Diarrhea, unspecified type       DDD (degenerative disc disease), lumbar       Relevant Orders   Ambulatory referral to Pain Clinic      Acute on Chronic progress LBP with known levoscoliosis, DDD lumbar mod to severe  Has attempted and tried  most conservative measures of treatment X-ray on chart 10/2020 Completed PT OT Chiropractor, dry needling, OTC medications  Worsening functional problem with back pain limiting him now.  Will pursue further management with referral to [facility] - he is interested in procedural pain management and may consider meds if indicated  Orders Placed This Encounter  Procedures   Ambulatory referral to Pain Clinic    Referral Priority:   Routine    Referral Type:   Consultation    Referral Reason:   Specialty Services Required    Requested Specialty:   Pain Medicine    Number of Visits Requested:   1     No orders of the defined types were placed in this encounter.   Follow-up: - Return in 2-4 weeks for back pain, can reschedule current apt for Friday  Patient verbalizes understanding with the above medical recommendations including the limitation of remote medical advice.  Specific follow-up and call-back criteria were given for patient to follow-up or seek medical care more urgently if needed.   - Time spent in direct consultation with patient on phone: 20 minutes   Nobie Putnam, [facility] 02/07/2021, 11:49 AM

## 2021-02-11 ENCOUNTER — Other Ambulatory Visit: Payer: Self-pay | Admitting: Family Medicine

## 2021-02-11 DIAGNOSIS — M5441 Lumbago with sciatica, right side: Secondary | ICD-10-CM

## 2021-02-11 NOTE — Telephone Encounter (Signed)
Requested medications are due for refill today.  yes  Requested medications are on the active medications list.  yes  Last refill. 01/13/2021 #60 with 1 refill  Future visit scheduled.   yes  Notes to clinic.  Medication not delegated.    Requested Prescriptions  Pending Prescriptions Disp Refills   baclofen (LIORESAL) 10 MG tablet [Pharmacy Med Name: BACLOFEN 10 MG TABLET] 60 tablet 1    Sig: TAKE 1/2-1 TABLET BY MOUTH TWICE A DAY AS NEEDED FOR MUSCLE SPASMS     Not Delegated - Analgesics:  Muscle Relaxants Failed - 02/11/2021  2:31 PM      Failed - This refill cannot be delegated      Passed - Valid encounter within last 6 months    Recent Outpatient Visits           4 days ago Chronic left-sided low back pain without sciatica   [place], DO   3 months ago Chronic left-sided low back pain without sciatica   [place], DO   4 months ago Annual physical exam   [facility] Olin Hauser, DO   12 months ago Colon cancer screening   [place], DO   1 year ago COVID-19 virus infection   [place], Devonne Doughty, DO       Future Appointments             In 3 days Parks Ranger, Devonne Doughty, [facility], [facility]

## 2021-02-14 ENCOUNTER — Ambulatory Visit: Payer: Medicare PPO | Admitting: Family Medicine

## 2021-02-26 ENCOUNTER — Other Ambulatory Visit: Payer: Self-pay | Admitting: Family Medicine

## 2021-02-26 DIAGNOSIS — M5441 Lumbago with sciatica, right side: Secondary | ICD-10-CM

## 2021-02-26 NOTE — Telephone Encounter (Signed)
Requested medication (s) are due for refill today: no  Requested medication (s) are on the active medication list: yes  Last refill:  02/12/21  Future visit scheduled: no  Notes to clinic:  Cannot be delegated        Requested Prescriptions  Pending Prescriptions Disp Refills   baclofen (LIORESAL) 10 MG tablet [Pharmacy Med Name: BACLOFEN 10 MG TABLET] 180 tablet 1    Sig: TAKE 1/2-1 TABLET BY MOUTH TWICE A DAY AS NEEDED FOR MUSCLE SPASMS     Not Delegated - Analgesics:  Muscle Relaxants Failed - 02/26/2021 11:25 AM      Failed - This refill cannot be delegated      Passed - Valid encounter within last 6 months    Recent Outpatient Visits           2 weeks ago Chronic left-sided low back pain without sciatica   [place], DO   3 months ago Chronic left-sided low back pain without sciatica   [facility] Olin Hauser, DO   4 months ago Annual physical exam   [facility] Olin Hauser, DO   1 year ago Colon cancer screening   [facility] Olin Hauser, DO   1 year ago COVID-19 virus infection   [place], Devonne Doughty, [place]

## 2021-03-09 ENCOUNTER — Ambulatory Visit
Admission: EM | Admit: 2021-03-09 | Discharge: 2021-03-09 | Disposition: A | Discharge status: Home / Self Care | Payer: Medicare HMO | Attending: Internal Medicine | Admitting: Internal Medicine

## 2021-03-09 ENCOUNTER — Encounter: Payer: Self-pay | Admitting: Emergency Medicine

## 2021-03-09 ENCOUNTER — Other Ambulatory Visit: Payer: Self-pay

## 2021-03-09 ENCOUNTER — Ambulatory Visit (INDEPENDENT_AMBULATORY_CARE_PROVIDER_SITE_OTHER): Payer: Medicare HMO

## 2021-03-09 DIAGNOSIS — S300XXA Contusion of lower back and pelvis, initial encounter: Secondary | ICD-10-CM

## 2021-03-09 DIAGNOSIS — M545 Low back pain, unspecified: Secondary | ICD-10-CM | POA: Diagnosis not present

## 2021-03-09 DIAGNOSIS — W109XXA Fall (on) (from) unspecified stairs and steps, initial encounter: Secondary | ICD-10-CM

## 2021-03-09 DIAGNOSIS — W19XXXA Unspecified fall, initial encounter: Secondary | ICD-10-CM

## 2021-03-09 DIAGNOSIS — M4316 Spondylolisthesis, lumbar region: Secondary | ICD-10-CM | POA: Diagnosis not present

## 2021-03-09 NOTE — ED Triage Notes (Signed)
Patient states that he was going up the steps up the house and fell back and landed on his back on Tuesday.  Patient not sure if he hit his head.  Patient also reports lower back pain.

## 2021-03-09 NOTE — Discharge Instructions (Signed)
Your back xray shows a lot of arthritis, but no broken bones You may take Tylenol as needed for pain, alternate ice and heat on area of pain for 15 minutes each 3-4 times a day as needed   Follow up with your primary care doctor next week.

## 2021-03-09 NOTE — ED Provider Notes (Signed)
[facility]    CSN: [id number] Arrival date & time: 03/09/21  1421      History   Chief Complaint Chief Complaint  Patient presents with   Fall   Back Pain    HPI Joseph Arroyo is a 81 y.o. male who presents with his wife due to falling 5 days ago from one step as he had his hands full of things and did not see to take the first step and fel back on his back more to this R and does not know if he hit his head. His wife who was behind him felt his body on her ankle, but she did not know either if he hit his head or not. He was able to get up fine and walk fine, but since his back has been hurting more than his usual. He and his wife denies LOC, HA or vomiting. He has more pain on his R lumbosacral region. Has chronic back pain and is awaiting to see the pain doctor for this.     Past Medical History:  Diagnosis Date   Allergic rhinitis due to pollen 11/21/2007   Brachial neuritis or radiculitis NOS    Cervicalgia    Concussion    age 7 - s/p accident   Costal chondritis    Depression    Essential and other specified forms of tremor    GERD (gastroesophageal reflux disease)    in past   Lumbago    Occlusion and stenosis of carotid artery without mention of cerebral infarction    Seizures [facility])    age 89 - after concussion    Patient Active Problem List   Diagnosis Date Noted   PAD (peripheral artery disease) ([place]) 10/01/2020   GAD (generalized anxiety disorder) 01/13/2018   MDD (major depressive disorder) 64/40/3474   Umbilical hernia 25/95/6387   Chronic pain of left knee 08/11/2017   Derangement of medial meniscus, posterior horn, left 08/11/2017   Vitamin D insufficiency 03/05/2016   BPH without obstruction/lower urinary tract symptoms 07/08/2015   Chronic fatigue 10/23/2014   Parkinson's disease ([place]) 09/20/2014   Major depression, recurrent, full remission ([place]) 07/26/2013   Unsteady gait 07/26/2013   Orthostatic hypotension 07/26/2013    Anxiety 06/23/2013   Tremor 05/18/2013   Bilateral carotid artery stenosis 08/30/2012    Past Surgical History:  Procedure Laterality Date   CATARACT EXTRACTION Right 07/18/14   CATARACT EXTRACTION W/PHACO Left 08/01/2014   Procedure: CATARACT EXTRACTION PHACO AND INTRAOCULAR LENS PLACEMENT ([place]);  Surgeon: Leandrew Koyanagi, MD;  Location: [facility];  Service: Ophthalmology;  Laterality: Left;  IVA TOPICAL   COLONOSCOPY  2008   OTHER SURGICAL HISTORY  2002   ear surgery   STAPEDES SURGERY Right 2002       Home Medications    Prior to Admission medications   Medication Sig Start Date End Date Taking? Authorizing Provider  B Complex Vitamins (VITAMIN B COMPLEX PO) Take 1 tablet by mouth daily.    [provider]  baclofen (LIORESAL) 10 MG tablet TAKE 1/2-1 TABLET BY MOUTH TWICE A DAY AS NEEDED FOR MUSCLE SPASMS 02/26/21   Karamalegos, Devonne Doughty, DO  Capsicum-Garlic 564-332 MG CAPS Take 200-300 mg by mouth.    [provider]  carbidopa-levodopa (SINEMET IR) 25-100 MG tablet Take 2 tablets by mouth 3 (three) times daily.    [provider]  clotrimazole-betamethasone (LOTRISONE) cream Apply 1-2 times a day for worsening flare dry skin dermatitis of toes/feet, may  re-use daily up to 1 week as needed. 09/06/18   Karamalegos, Alexander J, DO  fluticasone (FLONASE) 50 MCG/ACT nasal spray SPRAY 2 SPRAYS INTO EACH NOSTRIL EVERY DAY Patient not taking: Reported on 10/03/2020 05/12/17   Arnetha Courser, MD  gabapentin (NEURONTIN) 100 MG capsule 1 CAPSULE BY MOUTH IN THE AM AND 2 CAPSULES IN EVENING 12/31/20   Cottle, Billey Co., MD  HAWTHORNE BERRY PO Take by mouth daily.    [provider]  ibuprofen (ADVIL,MOTRIN) 200 MG tablet Take 200 mg by mouth every 6 (six) hours as needed for moderate pain.    [provider]  ipratropium (ATROVENT) 0.06 % nasal spray PLACE 2 SPRAYS INTO BOTH NOSTRILS 4 (FOUR) TIMES DAILY. FOR UP TO 5-7 DAYS THEN  STOP. Patient not taking: Reported on 10/03/2020 11/02/19   Olin Hauser, DO  L-Methylfolate 15 MG TABS Take 1 tablet (15 mg total) by mouth daily. 10/03/20   Cottle, Billey Co., MD  Lutein 10 MG TABS Take 1 tablet by mouth 3 (three) times daily. 1 daily    [provider]  meloxicam (MOBIC) 15 MG tablet TAKE 1/2 TO 1 TABLETS (7.5-15 MG TOTAL) BY MOUTH DAILY AS NEEDED FOR PAIN. 03/26/20   Olin Hauser, DO  omega-3 acid ethyl esters (LOVAZA) 1 g capsule Take 1 capsule by mouth daily.    [provider]  PARoxetine (PAXIL) 20 MG tablet TAKE 1 TABLET BY MOUTH EVERY DAY (WITH THE 40 MG TABLET FOR 60 MG TOTAL) 10/03/20   Cottle, Billey Co., MD  PARoxetine (PAXIL) 40 MG tablet TAKE 1 TABLET BY MOUTH EVERY DAY (WITH THE 20 MG TABLET FOR 60 MG TOTAL) 10/03/20   Cottle, Billey Co., MD  pramipexole (MIRAPEX) 0.125 MG tablet Take 0.125 mg by mouth at bedtime. 1 tab at bedtime    [provider]  PSYLLIUM HUSK PO Take by mouth.    [provider]  Saw Palmetto, Serenoa repens, 1000 MG CAPS Take 2 capsules by mouth daily.    [provider]    Family History Family History  Problem Relation Age of Onset   Heart failure Mother        enlarged heart    Asthma Father     Social History Social History   Tobacco Use   Smoking status: Never   Smokeless tobacco: Never  Vaping Use   Vaping Use: Never used  Substance Use Topics   Alcohol use: No    Alcohol/week: 0.0 standard drinks   Drug use: No     Allergies   Prednisone and Wheat bran   Review of Systems Review of Systems  Gastrointestinal:  Negative for nausea and vomiting.  Musculoskeletal:  Positive for back pain.  Skin:  Negative for rash and wound.  Neurological:  Positive for tremors. Negative for dizziness, syncope, numbness and headaches.       Has tremor from Parkinson's Has burning sensation on his L heel  Psychiatric/Behavioral:  Negative for behavioral problems and  confusion.        His wife noticed he seems a little more depressed than usual but is on medication bur this    Physical Exam Triage Vital Signs ED Triage Vitals  Enc Vitals Group     BP 03/09/21 1440 100/61     Pulse Rate 03/09/21 1440 (!) 57     Resp 03/09/21 1440 15     Temp 03/09/21 1440 97.6 F (36.4 C)  Temp Source 03/09/21 1440 Oral     SpO2 03/09/21 1440 100 %     Weight 03/09/21 1437 140 lb (63.5 kg)     Height 03/09/21 1437 5\' 8"  (1.727 m)     Head Circumference --      Peak Flow --      Pain Score 03/09/21 1436 10     Pain Loc --      Pain Edu? --      Excl. in [place]? --    No data found.  Updated Vital Signs BP 100/61 (BP Location: Right Arm)    Pulse (!) 57    Temp 97.6 F (36.4 C) (Oral)    Resp 15    Ht 5\' 8"  (1.727 m)    Wt 140 lb (63.5 kg)    SpO2 100%    BMI 21.29 kg/m   Visual Acuity Right Eye Distance:   Left Eye Distance:   Bilateral Distance:    Right Eye Near:   Left Eye Near:    Bilateral Near:     Physical Exam Vitals and nursing note reviewed.  Constitutional:      General: He is not in acute distress.    Appearance: He is not toxic-appearing.  HENT:     Head: Atraumatic.     Right Ear: External ear normal.     Left Ear: External ear normal.  Eyes:     General: No scleral icterus.    Extraocular Movements: Extraocular movements intact.     Conjunctiva/sclera: Conjunctivae normal.     Pupils: Pupils are equal, round, and reactive to light.  Pulmonary:     Effort: Pulmonary effort is normal.  Chest:     Chest wall: No tenderness.  Musculoskeletal:        General: Normal range of motion.     Comments: BACK- with scoliosis, ROM provoked his back pain. Neg SLR HIPS- normal ROM with no pain on his groin or ischium, but felt it on his R SI with ROM  Skin:    General: Skin is warm and dry.     Findings: No bruising, erythema or rash.  Neurological:     Mental Status: He is alert.     Motor: No weakness.     Coordination:  Coordination normal.     Gait: Gait normal.  Psychiatric:        Mood and Affect: Mood normal.        Behavior: Behavior normal.        Thought Content: Thought content normal.     UC Treatments / Results  Labs (all labs ordered are listed, but only abnormal results are displayed) Labs Reviewed - No data to display  EKG   Radiology DG Lumbar Spine Complete  Result Date: 03/09/2021 CLINICAL DATA:  Patient states that he was going up the steps up the house and fell back and landed on his back on Tuesday. Patient not sure if he hit his head. Patient also reports lower back pain. EXAM: LUMBAR SPINE - COMPLETE 4+ VIEW COMPARISON:  11/13/2020. FINDINGS: No fracture.  No bone lesion. Moderate to marked levoscoliosis, apex at L3-L4. Grade 1 retrolisthesis of L2 on L3, and minimally of L3 and L4. Moderate loss of disc height at L1-L2 and L2-L3. Mild loss of disc height at L3-L4 and L4-L5. Facet degenerative changes on the left at L3-L4, 4-L5 and L5-S1. Soft tissues are unremarkable. No convincing change from the prior radiographs. IMPRESSION: 1. No fracture or acute  finding. 2. Significant levoscoliosis. Disc and facet degenerative changes as detailed. No change from the prior radiographs. Electronically Signed   By: Lajean Manes M.D.   On: 03/09/2021 15:19    Procedures Procedures (including critical care time)  Medications Ordered in UC Medications - No data to display  Initial Impression / Assessment and Plan / UC Course  I have reviewed the triage vital signs and the nursing notes. Pertinent  imaging results that were available during my care of the patient were reviewed by me and considered in my medical decision making (see chart for details). Lumbosacral contusion/strain.  See instructions.    Final Clinical Impressions(s) / UC Diagnoses   Final diagnoses:  Fall, initial encounter  Lumbar contusion, initial encounter     Discharge Instructions      Your back xray shows a  lot of arthritis, but no broken bones You may take Tylenol as needed for pain, alternate ice and heat on area of pain for 15 minutes each 3-4 times a day as needed   Follow up with your primary care doctor next week.      ED Prescriptions   None    PDMP not reviewed this encounter.   Shelby Mattocks, [place] 03/09/21 1543

## 2021-03-10 ENCOUNTER — Ambulatory Visit: Payer: Medicare PPO

## 2021-03-14 ENCOUNTER — Telehealth: Payer: Self-pay | Admitting: Psychiatry

## 2021-03-14 ENCOUNTER — Telehealth: Payer: Self-pay | Admitting: Family Medicine

## 2021-03-14 ENCOUNTER — Other Ambulatory Visit: Payer: Self-pay

## 2021-03-14 MED ORDER — ALPRAZOLAM 0.25 MG PO TABS: 0.2500 mg | ORAL_TABLET | Freq: Three times a day (TID) | ORAL | 0 refills | Status: DC | PRN

## 2021-03-14 NOTE — Telephone Encounter (Signed)
Copied from [place] [id number]. Topic: General - Other >> Mar 14, 2021  9:11 AM Leward Quan A wrote: Reason for CRM: Patient called in to inform Dr Raliegh Ip that he have not heard from the pain clinic have been trying to get them since December and say that there have been no answer or call back Please advise and call Ph# [phone]

## 2021-03-14 NOTE — Telephone Encounter (Signed)
Abanoub's wife, [name], called in today. Joseph Arroyo is experiencing more anxiety right now. He does have pain issues and Parkinsons but they wanted advise on what to do for the increase in anxiety. Ruth's # [phone]

## 2021-03-14 NOTE — Telephone Encounter (Signed)
Rtc to wife and pt and they report they had a bottle of Alprazolam but very old. Informed them I would send a new Rx for 0.25 mg tid prn. #30. She asked if it could be cut in 1/2 and I advised her that she could try with a pill cutter but they aren't very big. They both report he shouldn't need it too often and didn't want to knock him out. Informed her it is a low dose and for the first dose to take when he would need to be up moving around much.   She would like to set up an apt for him to see Dr. Clovis Pu and with it being later in the day I informed her we would contact them next week for an apt. She said that's fine.   [facility] can you reach out to them Monday afternoon or Tuesday to set up an apt.

## 2021-03-17 ENCOUNTER — Encounter: Payer: Self-pay | Admitting: Family Medicine

## 2021-03-17 ENCOUNTER — Ambulatory Visit (INDEPENDENT_AMBULATORY_CARE_PROVIDER_SITE_OTHER): Payer: Medicare HMO | Admitting: Family Medicine

## 2021-03-17 ENCOUNTER — Other Ambulatory Visit: Payer: Self-pay

## 2021-03-17 VITALS — BP 110/61 | HR 59 | Ht 68.0 in | Wt 138.0 lb

## 2021-03-17 DIAGNOSIS — R69 Illness, unspecified: Secondary | ICD-10-CM | POA: Diagnosis not present

## 2021-03-17 DIAGNOSIS — G20 Parkinson's disease: Secondary | ICD-10-CM | POA: Diagnosis not present

## 2021-03-17 DIAGNOSIS — M545 Low back pain, unspecified: Secondary | ICD-10-CM

## 2021-03-17 DIAGNOSIS — M4186 Other forms of scoliosis, lumbar region: Secondary | ICD-10-CM

## 2021-03-17 DIAGNOSIS — F411 Generalized anxiety disorder: Secondary | ICD-10-CM | POA: Diagnosis not present

## 2021-03-17 DIAGNOSIS — G8929 Other chronic pain: Secondary | ICD-10-CM | POA: Diagnosis not present

## 2021-03-17 NOTE — Telephone Encounter (Signed)
Pt coming in today to discuss his pain.

## 2021-03-17 NOTE — Progress Notes (Signed)
Subjective:    Patient ID: TAVARUS POTEETE, male    DOB: 19-Jun-1940, 81 y.o.   MRN: [id number]  KELDON LASSEN is a 81 y.o. male presenting on 03/17/2021 for Tremors and Back Pain   HPI  Chronic Low Back Pain w Sciatica / Levoscoliosis Lumbar DDD  Update since last visit 02/07/21 > we placed Pain Management referral 02/07/21, and it was received on 02/10/21 by pain clinic, it is in process and they asked to check status today  Taking OTC tylenol Ext str 546m x2 10060m- he is taking 1-3 times per day PRN.  Taking Ibuprofen 20097m 4 = 800m64mD PRN  Today asking about dosing of meds.  His Psychiatry is rx - Gabapentin 100mg79mAM and 100mg 75mM. The rx was given for up to 3 a day to try. He says it helps his pain. Psychiatry ordered it for Anxiety and Sleep.  Per Psych - He is on Xanax low dose 0.25mg P21mot regularly. He took it years ago previously.  Continues chiropractor and PT - usually will do one or other. They recommended pain clinic as well.  Parkinson's disease Tremors  - Followed by [facility], movement disorder clinic He has persistent progressive worsening head tremors involuntary movements He is on medication management  Right Toe problem Left Heel pain vs plantar fasciitis L heel pain worse in night may wake up and early when walking pain. Improves with movement  Fall Backwards 2 weeks ago had x-rays did not break anything   Depression screen PHQ 2/9[facility]16/2023 10/01/2020 09/19/2019  Decreased Interest 1 0 0  Down, Depressed, Hopeless 0 0 0  PHQ - 2 Score 1 0 0  Altered sleeping 1 0 0  Tired, decreased energy '2 1 1  ' Change in appetite 0 0 0  Feeling bad or failure about yourself  0 0 0  Trouble concentrating 1 0 0  Moving slowly or fidgety/restless 1 0 0  Suicidal thoughts 0 0 0  PHQ-9 Score '6 1 1  ' Difficult doing work/chores Not difficult at all Not difficult at all Not difficult at all  Some recent data might be hidden    Social  History   Tobacco Use   Smoking status: Never   Smokeless tobacco: Never  Vaping Use   Vaping Use: Never used  Substance Use Topics   Alcohol use: No    Alcohol/week: 0.0 standard drinks   Drug use: No    Review of Systems Per HPI unless specifically indicated above     Objective:    BP 110/61    Pulse (!) 59    Ht '5\' 8"'  (1.727 m)    Wt 138 lb (62.6 kg)    SpO2 94%    BMI 20.98 kg/m   Wt Readings from Last 3 Encounters:  03/17/21 138 lb (62.6 kg)  03/09/21 140 lb (63.5 kg)  02/07/21 148 lb (67.1 kg)    Physical Exam Vitals and nursing note reviewed.  Constitutional:      General: He is not in acute distress.    Appearance: Normal appearance. He is well-developed. He is not diaphoretic.     Comments: Well-appearing, comfortable, cooperative  HENT:     Head: Normocephalic and atraumatic.  Eyes:     General:        Right eye: No discharge.        Left eye: No discharge.     Conjunctiva/sclera: Conjunctivae normal.  Cardiovascular:  Rate and Rhythm: Normal rate.  Pulmonary:     Effort: Pulmonary effort is normal.  Musculoskeletal:     Right lower leg: No edema.     Left lower leg: No edema.     Comments: Pes Cavus, bilateral R foot with some toes compressing against each other, some mild swelling. No abrasion.  Skin:    General: Skin is warm and dry.     Findings: No erythema or rash.  Neurological:     General: No focal deficit present.     Mental Status: He is alert and oriented to person, place, and time.     Comments: Increased movements tremors upper body and head nodding involuntary   Psychiatric:        Mood and Affect: Mood normal.        Behavior: Behavior normal.        Thought Content: Thought content normal.     Comments: Well groomed, good eye contact, normal speech and thoughts      Results for orders placed or performed in visit on 09/20/20  VITAMIN D 25 Hydroxy (Vit-D Deficiency, Fractures)  Result Value Ref Range   Vit D, 25-Hydroxy  44 30 - 100 ng/mL  PSA  Result Value Ref Range   PSA 1.73 < OR = 4.00 ng/mL  Lipid panel  Result Value Ref Range   Cholesterol 134 <200 mg/dL   HDL 57 > OR = 40 mg/dL   Triglycerides 82 <150 mg/dL   LDL Cholesterol (Calc) 61 mg/dL (calc)   Total CHOL/HDL Ratio 2.4 <5.0 (calc)   Non-HDL Cholesterol (Calc) 77 <130 mg/dL (calc)  COMPLETE METABOLIC PANEL WITH GFR  Result Value Ref Range   Glucose, Bld 81 65 - 99 mg/dL   BUN 16 7 - 25 mg/dL   Creat 0.76 0.70 - 1.28 mg/dL   eGFR 92 > OR = 60 mL/min/1.24m   BUN/Creatinine Ratio NOT APPLICABLE 6 - 22 (calc)   Sodium 140 135 - 146 mmol/L   Potassium 4.0 3.5 - 5.3 mmol/L   Chloride 104 98 - 110 mmol/L   CO2 29 20 - 32 mmol/L   Calcium 9.1 8.6 - 10.3 mg/dL   Total Protein 6.7 6.1 - 8.1 g/dL   Albumin 4.2 3.6 - 5.1 g/dL   Globulin 2.5 1.9 - 3.7 g/dL (calc)   AG Ratio 1.7 1.0 - 2.5 (calc)   Total Bilirubin 0.8 0.2 - 1.2 mg/dL   Alkaline phosphatase (APISO) 49 35 - 144 U/L   AST 18 10 - 35 U/L   ALT 22 9 - 46 U/L  CBC with Differential/Platelet  Result Value Ref Range   WBC 6.5 3.8 - 10.8 Thousand/uL   RBC 4.86 4.20 - 5.80 Million/uL   Hemoglobin 15.2 13.2 - 17.1 g/dL   HCT 45.2 38.5 - 50.0 %   MCV 93.0 80.0 - 100.0 fL   MCH 31.3 27.0 - 33.0 pg   MCHC 33.6 32.0 - 36.0 g/dL   RDW 12.2 11.0 - 15.0 %   Platelets 183 140 - 400 Thousand/uL   MPV 10.5 7.5 - 12.5 fL   Neutro Abs 4,297 1,500 - 7,800 cells/uL   Lymphs Abs 1,411 850 - 3,900 cells/uL   Absolute Monocytes 351 200 - 950 cells/uL   Eosinophils Absolute 371 15 - 500 cells/uL   Basophils Absolute 72 0 - 200 cells/uL   Neutrophils Relative % 66.1 %   Total Lymphocyte 21.7 %   Monocytes Relative 5.4 %   Eosinophils  Relative 5.7 %   Basophils Relative 1.1 %  Hemoglobin A1c  Result Value Ref Range   Hgb A1c MFr Bld 5.0 <5.7 % of total Hgb   Mean Plasma Glucose 97 mg/dL   eAG (mmol/L) 5.4 mmol/L      Assessment & Plan:   Problem List Items Addressed This Visit      Parkinson's disease ([place])   GAD (generalized anxiety disorder)   Other Visit Diagnoses     Chronic left-sided low back pain without sciatica    -  Primary   Levoscoliosis of lumbar spine           Chronic Back Pain For pain clinic referral - we just checked on it, Denise "[employer] (referral coordinator) has called their office and they are going to call you to schedule soon. He was advised to call them back if they don't get it scheduled ASAP  Keep Tylenol Ext Str 545m tabs take 2 for 10054m3 times a day OKAY to take all the time, even with Ibuprofen.  I would do week by week on and off Ibuprofen 20033m 3-4 = 600-800m46mN up to 3 times a day if need. Give yourself a break off of this after 1 week on.  Gabapentin is a great medicine for nerve pain and chronic pain, it is a safe option, I know psychiatry is prescribing it for anxiety/sleep, you can increase from 100mg97mce a day to 100mg 25mM and 200mg i35m and eventually go to 200mg tw35ma day or HIGHER to 300mg dos46m He may have neuropathy component as well so this can help.  Can consider a Podiatrist in future for toe/foot symptoms if it is a biomechanical structural problem  Tension Night Spllint for heel / plantar fascia   No orders of the defined types were placed in this encounter.     Follow up plan: Return if symptoms worsen or fail to improve.  AlexanderNobie Putnamh [facility] 03/17/2021, 1:49 PM

## 2021-03-17 NOTE — Patient Instructions (Addendum)
Thank you for coming to the office today.  For pain clinic referral - we just checked on it, Langley Gauss "Lexine Baton" [place] (referral coordinator) has called their office and they are going to call you to schedule soon.  If you don't hear back then we can check on it again. Feel free to call them back if they leave a message  Keep Tylenol Ext Str 500mg  tabs take 2 for 1000mg  3 times a day OKAY to take all the time, even with Ibuprofen.  I would do week by week on and off Ibuprofen 200mg  x 3-4 = 600-800mg  PRN up to 3 times a day if need. Give yourself a break off of this after 1 week on.  Gabapentin is a great medicine for nerve pain and chronic pain, it is a safe option, I know psychiatry is prescribing it for anxiety/sleep, you can increase from 100mg  twice a day to 100mg  in AM and 200mg  in PM and eventually go to 200mg  twice a day or HIGHER to 300mg  doses.  Can consider a Podiatrist in future for toe/foot symptoms if it is a biomechanical structural problem  Tension Night Spllint   Please schedule a Follow-up Appointment to: Return if symptoms worsen or fail to improve.  If you have any other questions or concerns, please feel free to call the office or send a message through [facility]. You may also schedule an earlier appointment if necessary.  Additionally, you may be receiving a survey about your experience at our office within a few days to 1 week by e-mail or mail. We value your feedback.  Nobie Putnam, DO [place]

## 2021-03-18 ENCOUNTER — Telehealth: Payer: Self-pay | Admitting: Family Medicine

## 2021-03-18 NOTE — Telephone Encounter (Signed)
Copied from [place]. Topic: General - Other >> Mar 18, 2021 12:47 PM Leward Quan A wrote: Reason for CRM: Patient called in to ask if Ms Joseph Arroyo could please get in contact with him regarding the referral for pain management. Patient can be reached at Ph# [phone]

## 2021-03-23 DIAGNOSIS — M899 Disorder of bone, unspecified: Secondary | ICD-10-CM | POA: Insufficient documentation

## 2021-03-23 DIAGNOSIS — G894 Chronic pain syndrome: Secondary | ICD-10-CM | POA: Insufficient documentation

## 2021-03-23 DIAGNOSIS — Z79899 Other long term (current) drug therapy: Secondary | ICD-10-CM | POA: Insufficient documentation

## 2021-03-23 DIAGNOSIS — Z789 Other specified health status: Secondary | ICD-10-CM | POA: Insufficient documentation

## 2021-03-23 NOTE — Progress Notes (Signed)
Patient: Joseph Arroyo  Service Category: E/M  Provider: Gaspar Cola, MD  DOB: 1941/02/06  DOS: 03/24/2021  Referring Provider: Nobie Putnam *  MRN: [id number]  Setting: Ambulatory outpatient  PCP: Olin Hauser, DO  Type: New Patient  Specialty: Interventional Pain Management    Location: Office  Delivery: Face-to-face     Primary Reason(s) for Visit: Encounter for initial evaluation of one or more chronic problems (new to examiner) potentially causing chronic pain, and posing a threat to normal musculoskeletal function. (Level of risk: High) CC: Back Pain (low) and Foot Pain (Left heel)  HPI  [name] is a 81 y.o. year old, male patient, who comes for the first time to our practice referred by Nobie Putnam * for our initial evaluation of his chronic pain. He has Bilateral carotid artery stenosis; Tremor; Anxiety; Major depression, recurrent, full remission ([place]); Unsteady gait; Orthostatic hypotension; Parkinson's disease ([place]); Chronic fatigue; BPH without obstruction/lower urinary tract symptoms; Vitamin D insufficiency; Chronic knee pain (Left); Derangement of medial meniscus, posterior horn (Left); Umbilical hernia; GAD (generalized anxiety disorder); MDD (major depressive disorder); PAD (peripheral artery disease) ([place]); Chronic anxiety; Depression; Chronic pain syndrome; Pharmacologic therapy; Disorder of skeletal system; Problems influencing health status; Long term prescription benzodiazepine use (alprazolam) (Xanax); Chronic anticoagulation (Plavix); Grade 1 Retrolisthesis of L2/L3 and L3/L4; Levoscoliosis of lumbar spine (L3-4 apex); DDD (degenerative disc disease), lumbosacral; Lumbosacral facet arthropathy (Left: L3-4, L4-5, and L5-S1); Tricompartment osteoarthritis of knee (Left); Baker cyst (Left); Chronic low back pain (1ry area of Pain) (Bilateral) (R>L) w/o sciatica; Lumbar facet syndrome (Bilateral); Chronic lower extremity pain (2ry area  of Pain) (Bilateral) (L>R); Lumbosacral radiculopathy at L2 (Bilateral); and Lumbosacral radiculopathy at L3 (Bilateral) on their problem list. Today he comes in for evaluation of his Back Pain (low) and Foot Pain (Left heel)  Pain Assessment: Location: Lower Back Radiating: sometimes Onset: More than a month ago Duration: Chronic pain Quality: Aching, Stabbing Severity: 10-Worst pain ever/10 (subjective, self-reported pain score)  Effect on ADL: Limits activities Timing: Constant Modifying factors: heat, lying very still, home tens BP: 125/84   HR: 63  Onset and Duration: Gradual and Date of onset: 1+ years Cause of pain:  arthritis and scoliosis Severity: Getting worse, NAS-11 at its worse: 10/10, and NAS-11 on the average: 10/10 Timing: Morning and After activity or exercise Aggravating Factors: Bending, Climbing, Kneeling, Lifiting, Prolonged sitting, Prolonged standing, Twisting, and Walking Alleviating Factors: Cold packs, Hot packs, Medications, TENS, Using a brace, and PT Associated Problems: Depression, Fatigue, Inability to control bowel, Spasms, Weakness, and Pain that wakes patient up Quality of Pain: Burning, Constant, Distressing, Heavy, Sharp, and Shooting Previous Examinations or Tests: X-rays, Neurological evaluation, Chiropractic evaluation, and Psychiatric evaluation Previous Treatments: Chiropractic manipulations, Physical Therapy, Pool exercises, Stretching exercises, and TENS  According to the patient the primary area of pain is that of the lower back (Bilateral) (R>L).  The patient denies any prior lumbar surgeries or MRIs.  The patient does admit to some recent x-rays of the lumbar spine.  He also admits to having had physical therapy at "[facility]" x2.  He refers that it occasionally will help him but in some other instances such as makes the pain worse.  The patient denies any prior nerve blocks.  Physical exam using provocative hyperextension on  rotation and the [facility] maneuver proved to be positive bilaterally for lumbar facet arthralgia, which the patient indicated exactly reproduced his low back pain.  The patient's secondary area pain is  that of the lower extremities (Bilateral) (L>R).  In the case of the left lower extremity the patient indicates having pain that goes over the anterior thigh area down to the level of the knee.  In the case of the right lower extremity it also goes through the exact same area.  This pain tends to be a shooting, electrical-like sensation that he gets with certain types of movements of his lumbar spine.  He denies any pain from the knee down except for occasional left heel pain.  On physical exam the patient demonstrated difficulty standing up and the pain pattern seems to follow that of and L2/L3 dermatome, bilaterally.  The patient's third area pain is that of the left heel with pain that he describes to be as intermittent, burning, and it typically happens when he lays down on his right side.  This pain will go away when he gets up out of the bed and he walks around.  It occurs only at night.  He refers that when he sleeps on his back he tends not to have this pain.  Pharmacotherapy: The patient indicates taking gabapentin 100 mg p.o. daily in a.m. and 200 mg at bedtime.  He also has taken baclofen, Flexeril, and he was recently given some Xanax for anxiety.  He also has taken meloxicam, Tylenol, and ibuprofen, which he describes as being helpful.  The patient indicates having intolerance to the use of oral prednisone were it makes him very nervous and hyperactive.  Other medical problems: The patient has significant tremors that have been worsening secondary to Parkinson's.  He denies having epilepsy or any kind of seizures.  He is parking since he is being treated at [facility].  Today I took the time to provide the patient with information regarding my pain practice. The patient was informed that my  practice is divided into two sections: an interventional pain management section, as well as a completely separate and distinct medication management section. I explained that I have procedure days for my interventional therapies, and evaluation days for follow-ups and medication management. Because of the amount of documentation required during both, they are kept separated. This means that there is the possibility that he may be scheduled for a procedure on one day, and medication management the next. I have also informed him that because of staffing and facility limitations, I no longer take patients for medication management only. To illustrate the reasons for this, I gave the patient the example of surgeons, and how inappropriate it would be to refer a patient to his/her care, just to write for the post-surgical antibiotics on a surgery done by a different surgeon.   Because interventional pain management is my board-certified specialty, the patient was informed that joining my practice means that they are open to any and all interventional therapies. I made it clear that this does not mean that they will be forced to have any procedures done. What this means is that I believe interventional therapies to be essential part of the diagnosis and proper management of chronic pain conditions. Therefore, patients not interested in these interventional alternatives will be better served under the care of a different practitioner.  The patient was also made aware of my Comprehensive Pain Management Safety Guidelines where by joining my practice, they limit all of their nerve blocks and joint injections to those done by our practice, for as long as we are retained to manage their care.   Historic Controlled Substance Pharmacotherapy Review  PMP and historical list of controlled substances: Alprazolam 0.25 mg tablet, 1 tablet p.o. 3 times daily (last filled done 03/14/2021) Current opioid analgesics:  None MME/day: 0 mg/day  Historical Monitoring: The patient  reports no history of drug use. List of all UDS Test(s): No results found for: MDMA, COCAINSCRNUR, [place], [place], CANNABQUANT, THCU, [place] List of other Serum/Urine Drug Screening Test(s):  No results found for: AMPHSCRSER, BARBSCRSER, BENZOSCRSER, COCAINSCRSER, COCAINSCRNUR, PCPSCRSER, PCPQUANT, THCSCRSER, THCU, CANNABQUANT, OPIATESCRSER, OXYSCRSER, PROPOXSCRSER, ETH Historical Background Evaluation: [place] PMP: PDMP reviewed during this encounter. Online review of the past 55-monthperiod conducted.             PMP NARX Score Report:  Narcotic: 060 Sedative: 121 Stimulant: 000 [place] Department of public safety, offender search: (Editor, commissioningInformation) Non-contributory Risk Assessment Profile: Aberrant behavior: None observed or detected today Risk factors for fatal opioid overdose: None identified today PMP NARX Overdose Risk Score: 030 Fatal overdose hazard ratio (HR): Calculation deferred Non-fatal overdose hazard ratio (HR): Calculation deferred Risk of opioid abuse or dependence: 0.7-3.0% with doses ? 36 MME/day and 6.1-26% with doses ? 120 MME/day. Substance use disorder (SUD) risk level: See below Personal History of Substance Abuse (SUD-Substance use disorder):  Alcohol: Negative  Illegal Drugs: Negative  Rx Drugs: Negative  ORT Risk Level calculation: Low Risk  Opioid Risk Tool - 03/24/21 1114       Family History of Substance Abuse   Alcohol Negative    Illegal Drugs Negative    Rx Drugs Negative      Personal History of Substance Abuse   Alcohol Negative    Illegal Drugs Negative    Rx Drugs Negative      Age   Age between 130-45years  No      History of Preadolescent Sexual Abuse   History of Preadolescent Sexual Abuse Negative or Male      Psychological Disease   Psychological Disease Negative    Depression Positive      Total Score   Opioid Risk Tool Scoring 1    Opioid Risk Interpretation Low  Risk            ORT Scoring interpretation table:  Score <3 = Low Risk for SUD  Score between 4-7 = Moderate Risk for SUD  Score >8 = High Risk for Opioid Abuse   PHQ-2 Depression Scale:  Total score: 2  PHQ-2 Scoring interpretation table: (Score and probability of major depressive disorder)  Score 0 = No depression  Score 1 = 15.4% Probability  Score 2 = 21.1% Probability  Score 3 = 38.4% Probability  Score 4 = 45.5% Probability  Score 5 = 56.4% Probability  Score 6 = 78.6% Probability   PHQ-9 Depression Scale:  Total score: 9  PHQ-9 Scoring interpretation table:  Score 0-4 = No depression  Score 5-9 = Mild depression  Score 10-14 = Moderate depression  Score 15-19 = Moderately severe depression  Score 20-27 = Severe depression (2.4 times higher risk of SUD and 2.89 times higher risk of overuse)   Pharmacologic Plan: As per protocol, I have not taken over any controlled substance management, pending the results of ordered tests and/or consults.            Initial impression: Pending review of available data and ordered tests.  Meds   Current Outpatient Medications:    ALPRAZolam (XANAX) 0.25 MG tablet, Take 1 tablet (0.25 mg total) by mouth 3 (three) times daily as needed for anxiety., Disp: 30  tablet, Rfl: 0   B Complex Vitamins (VITAMIN B COMPLEX PO), Take 1 tablet by mouth daily., Disp: , Rfl:    baclofen (LIORESAL) 10 MG tablet, TAKE 1/2-1 TABLET BY MOUTH TWICE A DAY AS NEEDED FOR MUSCLE SPASMS, Disp: 180 tablet, Rfl: 1   Capsicum-Garlic 481-856 MG CAPS, Take 200-300 mg by mouth., Disp: , Rfl:    carbidopa-levodopa (SINEMET IR) 25-100 MG tablet, Take 2 tablets by mouth 3 (three) times daily., Disp: , Rfl:    clotrimazole-betamethasone (LOTRISONE) cream, Apply 1-2 times a day for worsening flare dry skin dermatitis of toes/feet, may re-use daily up to 1 week as needed., Disp: 30 g, Rfl: 1   co-enzyme Q-10 30 MG capsule, Take 30 mg by mouth daily., Disp: , Rfl:     gabapentin (NEURONTIN) 100 MG capsule, 1 CAPSULE BY MOUTH IN THE AM AND 2 CAPSULES IN EVENING, Disp: 270 capsule, Rfl: 1   HAWTHORNE BERRY PO, Take by mouth daily., Disp: , Rfl:    ibuprofen (ADVIL,MOTRIN) 200 MG tablet, Take 200 mg by mouth every 6 (six) hours as needed for moderate pain., Disp: , Rfl:    L-Methylfolate 15 MG TABS, Take 1 tablet (15 mg total) by mouth daily., Disp: 90 tablet, Rfl: 3   Lutein 10 MG TABS, Take 1 tablet by mouth 3 (three) times daily. 1 daily, Disp: , Rfl:    Magnesium 200 MG TABS, Take 200 mg by mouth in the morning and at bedtime., Disp: , Rfl:    omega-3 acid ethyl esters (LOVAZA) 1 g capsule, Take 1 capsule by mouth daily., Disp: , Rfl:    PARoxetine (PAXIL) 40 MG tablet, TAKE 1 TABLET BY MOUTH EVERY DAY (WITH THE 20 MG TABLET FOR 60 MG TOTAL), Disp: 90 tablet, Rfl: 1   pramipexole (MIRAPEX) 0.125 MG tablet, Take 0.125 mg by mouth at bedtime. 1 tab at bedtime, Disp: , Rfl:    PSYLLIUM HUSK PO, Take by mouth., Disp: , Rfl:    Saw Palmetto, Serenoa repens, 1000 MG CAPS, Take 2 capsules by mouth daily., Disp: , Rfl:    PARoxetine (PAXIL) 20 MG tablet, TAKE 1 TABLET BY MOUTH EVERY DAY (WITH THE 40 MG TABLET FOR 60 MG TOTAL), Disp: 90 tablet, Rfl: 1  Imaging Review  Lumbosacral Imaging: Lumbar DG (Complete) 4+V: Results for orders placed during the hospital encounter of 03/09/21 DG Lumbar Spine Complete  Narrative CLINICAL DATA:  Patient states that he was going up the steps up the house and fell back and landed on his back on Tuesday. Patient not sure if he hit his head. Patient also reports lower back pain.  EXAM: LUMBAR SPINE - COMPLETE 4+ VIEW  COMPARISON:  11/13/2020.  FINDINGS: No fracture.  No bone lesion.  Moderate to marked levoscoliosis, apex at L3-L4.  Grade 1 retrolisthesis of L2 on L3, and minimally of L3 and L4.  Moderate loss of disc height at L1-L2 and L2-L3. Mild loss of disc height at L3-L4 and L4-L5.  Facet degenerative changes  on the left at L3-L4, 4-L5 and L5-S1.  Soft tissues are unremarkable.  No convincing change from the prior radiographs.  IMPRESSION: 1. No fracture or acute finding. 2. Significant levoscoliosis. Disc and facet degenerative changes as detailed. No change from the prior radiographs.   Electronically Signed By: Lajean Manes M.D. On: 03/09/2021 15:19  Knee Imaging: Knee-L MR w/o contrast: Results for orders placed during the hospital encounter of 05/17/17 MR KNEE LEFT WO CONTRAST  Narrative CLINICAL DATA:  Status post fall.  Pain around the patella.  EXAM: MRI OF THE LEFT KNEE WITHOUT CONTRAST  TECHNIQUE: Multiplanar, multisequence MR imaging of the knee was performed. No intravenous contrast was administered.  COMPARISON:  None.  FINDINGS: Patient motion degrading image quality limiting evaluation.  MENISCI  Medial meniscus: Possible small tear along the posterior periphery of the posterior horn-body junction of the medial meniscus (image 2/series 8).  Lateral meniscus: Small radial tear of the free edge of the body of the lateral meniscus.  LIGAMENTS  Cruciates:  Intact ACL and PCL.  Collaterals: Medial collateral ligament is intact. Lateral collateral ligament complex is intact.  CARTILAGE  Patellofemoral: Full-thickness cartilage loss of the medial patella. High-grade partial-thickness cartilage loss with areas of full-thickness cartilage loss of the medial trochlea. High-grade partial-thickness cartilage loss with areas of full-thickness cartilage loss the lateral patellofemoral compartment with subchondral reactive marrow edema in the lateral trochlea.  Medial: Mild partial-thickness cartilage loss of the medial femorotibial compartment.  Lateral: Mild partial-thickness cartilage loss of the lateral femoral condyle and lateral tibial plateau with small marginal osteophytes.  Joint: Small joint effusion. Severe edema in superolateral Hoffa's fat.  No plical thickening.  Popliteal Fossa:  Small Baker cyst.  Intact popliteus tendon.  Extensor Mechanism: Intact quadriceps tendon. Intact patellar tendon. Intact medial patellar retinaculum. Intact lateral patellar retinaculum. Intact MPFL.  Bones:  No marrow signal abnormality.  No fracture or dislocation.  Other: No fluid collection or hematoma.  Muscles are normal.  IMPRESSION: 1. Possible small tear along the posterior periphery of the posterior horn-body junction of the medial meniscus (image 2/series 8). 2. Small radial tear of the free edge of the body of the lateral meniscus. 3. Tricompartmental cartilage abnormalities as described above most severe in the patellofemoral compartment. 4. Severe edema in superolateral Hoffa's fat as can be seen with patellar tendon-lateral femoral condyle friction syndrome.   Electronically Signed By: Kathreen Devoid On: 05/17/2017 14:08  Knee-L DG 4 views: Results for orders placed during the hospital encounter of 07/24/16 DG Knee Complete 4 Views Left  Narrative CLINICAL DATA:  Left knee pain after injury several months ago.  EXAM: LEFT KNEE - COMPLETE 4+ VIEW  COMPARISON:  None.  FINDINGS: No evidence of fracture, dislocation, or joint effusion. No evidence of arthropathy or other focal bone abnormality. Vascular calcifications are noted.  IMPRESSION: No significant abnormality seen in the left knee.   Electronically Signed By: Marijo Conception, M.D. On: 07/24/2016 14:46  Complexity Note: Imaging results reviewed. Results shared with Mr. Mashek, using Layman's terms.                        ROS  Cardiovascular: No reported cardiovascular signs or symptoms such as High blood pressure, coronary artery disease, abnormal heart rate or rhythm, heart attack, blood thinner therapy or heart weakness and/or failure Pulmonary or Respiratory: Snoring  Neurological: Curved spine Psychological-Psychiatric: Anxiousness, Depressed,  and Prone to panicking Gastrointestinal: No reported gastrointestinal signs or symptoms such as vomiting or evacuating blood, reflux, heartburn, alternating episodes of diarrhea and constipation, inflamed or scarred liver, or pancreas or irrregular and/or infrequent bowel movements Genitourinary: Passing kidney stones Hematological: Brusing easily Endocrine: No reported endocrine signs or symptoms such as high or low blood sugar, rapid heart rate due to high thyroid levels, obesity or weight gain due to slow thyroid or thyroid disease Rheumatologic: Joint aches and or swelling due to excess weight (Osteoarthritis) and Constant unexplained fatigue (Chronic  Fatigue Syndrome) Musculoskeletal: Negative for myasthenia gravis, muscular dystrophy, multiple sclerosis or malignant hyperthermia Work History: Retired  Allergies  Mr. Cortese is allergic to prednisone and wheat bran.  Laboratory Chemistry Profile   Renal Lab Results  Component Value Date   BUN 16 09/23/2020   CREATININE 0.76 96/28/3662   BCR NOT APPLICABLE 94/76/5465   GFRAA 100 09/07/2019   GFRNONAA 86 09/07/2019     Electrolytes Lab Results  Component Value Date   NA 140 09/23/2020   K 4.0 09/23/2020   CL 104 09/23/2020   CALCIUM 9.1 09/23/2020   MG 2.2 02/27/2015     Hepatic Lab Results  Component Value Date   AST 18 09/23/2020   ALT 22 09/23/2020   ALBUMIN 3.6 07/24/2016   ALKPHOS 49 07/24/2016     ID Lab Results  Component Value Date   SARSCOV2NAA POSITIVE 10/11/2019     Bone Lab Results  Component Value Date   VD25OH 44 09/23/2020     Endocrine Lab Results  Component Value Date   GLUCOSE 81 09/23/2020   HGBA1C 5.0 09/23/2020   TSH 1.59 09/07/2019     Neuropathy Lab Results  Component Value Date   VITAMINB12 612 09/07/2019   HGBA1C 5.0 09/23/2020     CNS No results found for: COLORCSF, APPEARCSF, RBCCOUNTCSF, WBCCSF, POLYSCSF, LYMPHSCSF, EOSCSF, PROTEINCSF, GLUCCSF, JCVIRUS, CSFOLI,  IGGCSF, LABACHR, ACETBL, LABACHR, ACETBL   Inflammation (CRP: Acute   ESR: Chronic) Lab Results  Component Value Date   ESRSEDRATE 4 03/24/2021     Rheumatology No results found for: RF, ANA, LABURIC, URICUR, LYMEIGGIGMAB, LYMEABIGMQN, HLAB27   Coagulation Lab Results  Component Value Date   PLT 183 09/23/2020     Cardiovascular Lab Results  Component Value Date   HGB 15.2 09/23/2020   HCT 45.2 09/23/2020     Screening Lab Results  Component Value Date   SARSCOV2NAA POSITIVE 10/11/2019     Cancer No results found for: CEA, CA125, LABCA2   Allergens No results found for: ALMOND, APPLE, ASPARAGUS, AVOCADO, BANANA, BARLEY, BASIL, BAYLEAF, GREENBEAN, LIMABEAN, WHITEBEAN, BEEFIGE, REDBEET, BLUEBERRY, BROCCOLI, CABBAGE, MELON, CARROT, CASEIN, CASHEWNUT, CAULIFLOWER, CELERY     Note: Lab results reviewed.  [place]  Drug: Mr. Romack  reports no history of drug use. Alcohol:  reports no history of alcohol use. Tobacco:  reports that he has never smoked. He has never used smokeless tobacco. Medical:  has a past medical history of Allergic rhinitis due to pollen (11/21/2007), Brachial neuritis or radiculitis NOS, Cervicalgia, Concussion, Costal chondritis, Depression, Essential and other specified forms of tremor, GERD (gastroesophageal reflux disease), Lumbago, Occlusion and stenosis of carotid artery without mention of cerebral infarction, and Seizures ([place]). Family: family history includes Asthma in his father; Heart failure in his mother.  Past Surgical History:  Procedure Laterality Date   CATARACT EXTRACTION Right 07/18/14   CATARACT EXTRACTION W/PHACO Left 08/01/2014   Procedure: CATARACT EXTRACTION PHACO AND INTRAOCULAR LENS PLACEMENT ([place]);  Surgeon: Leandrew Koyanagi, MD;  Location: [facility];  Service: Ophthalmology;  Laterality: Left;  IVA TOPICAL   COLONOSCOPY  2008   OTHER SURGICAL HISTORY  2002   ear surgery   STAPEDES SURGERY Right 2002   Active  Ambulatory Problems    Diagnosis Date Noted   Bilateral carotid artery stenosis 08/30/2012   Tremor 05/18/2013   Anxiety 06/23/2013   Major depression, recurrent, full remission ([place]) 07/26/2013   Unsteady gait 07/26/2013   Orthostatic hypotension 07/26/2013   Parkinson's disease ([place]) 09/20/2014  Chronic fatigue 10/23/2014   BPH without obstruction/lower urinary tract symptoms 07/08/2015   Vitamin D insufficiency 03/05/2016   Chronic knee pain (Left) 08/11/2017   Derangement of medial meniscus, posterior horn (Left) 78/24/2353   Umbilical hernia 61/44/3154   GAD (generalized anxiety disorder) 01/13/2018   MDD (major depressive disorder) 01/13/2018   PAD (peripheral artery disease) ([place]) 10/01/2020   Chronic anxiety 06/23/2013   Depression 07/26/2013   Chronic pain syndrome 03/23/2021   Pharmacologic therapy 03/23/2021   Disorder of skeletal system 03/23/2021   Problems influencing health status 03/23/2021   Long term prescription benzodiazepine use (alprazolam) (Xanax) 03/24/2021   Chronic anticoagulation (Plavix) 03/24/2021   Grade 1 Retrolisthesis of L2/L3 and L3/L4 03/24/2021   Levoscoliosis of lumbar spine (L3-4 apex) 03/24/2021   DDD (degenerative disc disease), lumbosacral 03/24/2021   Lumbosacral facet arthropathy (Left: L3-4, L4-5, and L5-S1) 03/24/2021   Tricompartment osteoarthritis of knee (Left) 03/24/2021   Baker cyst (Left) 03/24/2021   Chronic low back pain (1ry area of Pain) (Bilateral) (R>L) w/o sciatica 03/24/2021   Lumbar facet syndrome (Bilateral) 03/24/2021   Chronic lower extremity pain (2ry area of Pain) (Bilateral) (L>R) 03/24/2021   Lumbosacral radiculopathy at L2 (Bilateral) 03/24/2021   Lumbosacral radiculopathy at L3 (Bilateral) 03/24/2021   Resolved Ambulatory Problems    Diagnosis Date Noted   No Resolved Ambulatory Problems   Past Medical History:  Diagnosis Date   Allergic rhinitis due to pollen 11/21/2007   Brachial neuritis or  radiculitis NOS    Cervicalgia    Concussion    Costal chondritis    Essential and other specified forms of tremor    GERD (gastroesophageal reflux disease)    Lumbago    Occlusion and stenosis of carotid artery without mention of cerebral infarction    Seizures ([place])    Constitutional Exam  General appearance: Well nourished, well developed, and well hydrated. In no apparent acute distress Vitals:   03/24/21 1058 03/24/21 1059  BP:  125/84  Pulse:  63  Resp:  18  Temp: (!) 97.1 F (36.2 C)   SpO2:  100%  Weight: 138 lb (62.6 kg)   Height: '5\' 8"'  (1.727 m)    BMI Assessment: Estimated body mass index is 20.98 kg/m as calculated from the following:   Height as of this encounter: '5\' 8"'  (1.727 m).   Weight as of this encounter: 138 lb (62.6 kg).  BMI interpretation table: BMI level Category Range association with higher incidence of chronic pain  <18 kg/m2 Underweight   18.5-24.9 kg/m2 Ideal body weight   25-29.9 kg/m2 Overweight Increased incidence by 20%  30-34.9 kg/m2 Obese (Class I) Increased incidence by 68%  35-39.9 kg/m2 Severe obesity (Class II) Increased incidence by 136%  >40 kg/m2 Extreme obesity (Class III) Increased incidence by 254%   Patient's current BMI Ideal Body weight  Body mass index is 20.98 kg/m. Ideal body weight: 68.4 kg (150 lb 12.7 oz)   BMI Readings from Last 4 Encounters:  03/24/21 20.98 kg/m  03/17/21 20.98 kg/m  03/09/21 21.29 kg/m  02/07/21 22.50 kg/m   Wt Readings from Last 4 Encounters:  03/24/21 138 lb (62.6 kg)  03/17/21 138 lb (62.6 kg)  03/09/21 140 lb (63.5 kg)  02/07/21 148 lb (67.1 kg)    Psych/Mental status: Alert, oriented x 3 (person, place, & time)       Eyes: PERLA Respiratory: No evidence of acute respiratory distress  Assessment  Primary Diagnosis & Pertinent Problem List: The primary encounter diagnosis  was Chronic pain syndrome. Diagnoses of Pharmacologic therapy, Long term prescription benzodiazepine use  (alprazolam) (Xanax), Chronic anticoagulation (Plavix), Disorder of skeletal system, Problems influencing health status, Chronic low back pain (1ry area of Pain) (Bilateral) (R>L) w/o sciatica, Lumbar facet syndrome (Bilateral), Chronic lower extremity pain (2ry area of Pain) (Bilateral) (L>R), Grade 1 Retrolisthesis of L2/L3 and L3/L4, Lumbosacral radiculopathy at L2 (Bilateral), and Lumbosacral radiculopathy at L3 (Bilateral) were also pertinent to this visit.  Visit Diagnosis (New problems to examiner): 1. Chronic pain syndrome   2. Pharmacologic therapy   3. Long term prescription benzodiazepine use (alprazolam) (Xanax)   4. Chronic anticoagulation (Plavix)   5. Disorder of skeletal system   6. Problems influencing health status   7. Chronic low back pain (1ry area of Pain) (Bilateral) (R>L) w/o sciatica   8. Lumbar facet syndrome (Bilateral)   9. Chronic lower extremity pain (2ry area of Pain) (Bilateral) (L>R)   10. Grade 1 Retrolisthesis of L2/L3 and L3/L4   11. Lumbosacral radiculopathy at L2 (Bilateral)   12. Lumbosacral radiculopathy at L3 (Bilateral)    Plan of Care (Initial workup plan)  Note: Mr. Halbert was reminded that as per protocol, today's visit has been an evaluation only. We have not taken over the patient's controlled substance management.  Problem-specific plan: No problem-specific Assessment & Plan notes found for this encounter.  Lab Orders         Compliance Drug Analysis, Ur         Comp. Metabolic Panel (12)         Magnesium         Vitamin B12         Sedimentation rate         25-Hydroxy vitamin D Lcms D2+D3         C-reactive protein     Imaging Orders         DG Lumbar Spine Complete W/Bend         MR LUMBAR SPINE WO CONTRAST     Referral Orders  No referral(s) requested today   Procedure Orders    No procedure(s) ordered today   Pharmacotherapy (current): Medications ordered:  No orders of the defined types were placed in this  encounter.  Medications administered during this visit: Tania L. Bentson had no medications administered during this visit.   Pharmacological management options:  Opioid Analgesics: The patient was informed that there is no guarantee that he would be a candidate for opioid analgesics. The decision will be made following CDC guidelines. This decision will be based on the results of diagnostic studies, as well as Mr. Camilo's risk profile.   Membrane stabilizer: To be determined at a later time  Muscle relaxant: To be determined at a later time  NSAID: To be determined at a later time  Other analgesic(s): To be determined at a later time   Interventional management options: Mr. Badley was informed that there is no guarantee that he would be a candidate for interventional therapies. The decision will be based on the results of diagnostic studies, as well as Mr. Zielinski's risk profile.  Procedure(s) under consideration:  Pending results of ordered studies      Interventional Therapies  Risk   Complexity Considerations:   Estimated body mass index is 20.98 kg/m as calculated from the following:   Height as of this encounter: '5\' 8"'  (1.727 m).   Weight as of this encounter: 138 lb (62.6 kg). PLAVIX ANTICOAGULATION: (Stop: 7-10 days  Restart: 2 hours)   Planned   Pending:   Diagnostic flexion and extension x-rays of the lumbar spine  Diagnostic lumbar MRI looking at possible pathology causing L2/L3 radiculopathy    Under consideration:   Diagnostic bilateral lumbar facet MBB #1  Diagnostic bilateral L2 and/or L3 TFESI #1    Completed:   None at this time   Therapeutic   Palliative (PRN) options:   None established    Provider-requested follow-up: Return for (76mn), Eval-day (M,W), (F2F), 2nd Visit, for review of ordered tests.  Future Appointments  Date Time Provider [place] 03/25/2021  4:00 PM Cottle, CBilley Co, MD [facility] None  04/21/2021  8:00 AM  NMilinda Pointer MD [facility] None  05/15/2021  8:30 AM [facility] VASC 1 [facility] None  05/15/2021  9:30 AM Schnier, GDolores Lory MD [facility] None  07/03/2021  2:00 PM Cottle, CBilley Co, MD [facility] None    Note by: FGaspar Cola MD Date: 03/24/2021; Time: 2:27 PM

## 2021-03-24 ENCOUNTER — Ambulatory Visit (HOSPITAL_BASED_OUTPATIENT_CLINIC_OR_DEPARTMENT_OTHER): Payer: Medicare HMO | Admitting: Pain Medicine

## 2021-03-24 ENCOUNTER — Ambulatory Visit
Admission: RE | Admit: 2021-03-24 | Discharge: 2021-03-24 | Disposition: A | Discharge status: Home / Self Care | Payer: Medicare HMO | Source: Ambulatory Visit | Attending: Pain Medicine | Admitting: Pain Medicine

## 2021-03-24 ENCOUNTER — Other Ambulatory Visit: Payer: Self-pay

## 2021-03-24 ENCOUNTER — Other Ambulatory Visit
Admission: RE | Admit: 2021-03-24 | Discharge: 2021-03-24 | Disposition: A | Discharge status: Home / Self Care | Payer: Medicare HMO | Source: Ambulatory Visit | Attending: Pain Medicine | Admitting: Pain Medicine

## 2021-03-24 ENCOUNTER — Encounter: Payer: Self-pay | Admitting: Pain Medicine

## 2021-03-24 VITALS — BP 125/84 | HR 63 | Temp 97.1°F | Resp 18 | Ht 68.0 in | Wt 138.0 lb

## 2021-03-24 DIAGNOSIS — M4186 Other forms of scoliosis, lumbar region: Secondary | ICD-10-CM | POA: Insufficient documentation

## 2021-03-24 DIAGNOSIS — G8929 Other chronic pain: Secondary | ICD-10-CM

## 2021-03-24 DIAGNOSIS — M47817 Spondylosis without myelopathy or radiculopathy, lumbosacral region: Secondary | ICD-10-CM | POA: Diagnosis not present

## 2021-03-24 DIAGNOSIS — M79605 Pain in left leg: Secondary | ICD-10-CM

## 2021-03-24 DIAGNOSIS — Z789 Other specified health status: Secondary | ICD-10-CM

## 2021-03-24 DIAGNOSIS — M47816 Spondylosis without myelopathy or radiculopathy, lumbar region: Secondary | ICD-10-CM | POA: Diagnosis not present

## 2021-03-24 DIAGNOSIS — M4310 Spondylolisthesis, site unspecified: Secondary | ICD-10-CM | POA: Diagnosis not present

## 2021-03-24 DIAGNOSIS — Z7901 Long term (current) use of anticoagulants: Secondary | ICD-10-CM

## 2021-03-24 DIAGNOSIS — M899 Disorder of bone, unspecified: Secondary | ICD-10-CM | POA: Insufficient documentation

## 2021-03-24 DIAGNOSIS — M545 Low back pain, unspecified: Secondary | ICD-10-CM | POA: Insufficient documentation

## 2021-03-24 DIAGNOSIS — G894 Chronic pain syndrome: Secondary | ICD-10-CM

## 2021-03-24 DIAGNOSIS — M7122 Synovial cyst of popliteal space [Baker], left knee: Secondary | ICD-10-CM | POA: Insufficient documentation

## 2021-03-24 DIAGNOSIS — M4316 Spondylolisthesis, lumbar region: Secondary | ICD-10-CM | POA: Diagnosis not present

## 2021-03-24 DIAGNOSIS — M1712 Unilateral primary osteoarthritis, left knee: Secondary | ICD-10-CM | POA: Insufficient documentation

## 2021-03-24 DIAGNOSIS — M79604 Pain in right leg: Secondary | ICD-10-CM

## 2021-03-24 DIAGNOSIS — M5137 Other intervertebral disc degeneration, lumbosacral region: Secondary | ICD-10-CM | POA: Insufficient documentation

## 2021-03-24 DIAGNOSIS — Z79899 Other long term (current) drug therapy: Secondary | ICD-10-CM

## 2021-03-24 DIAGNOSIS — M5417 Radiculopathy, lumbosacral region: Secondary | ICD-10-CM | POA: Diagnosis not present

## 2021-03-24 DIAGNOSIS — M419 Scoliosis, unspecified: Secondary | ICD-10-CM | POA: Diagnosis not present

## 2021-03-24 LAB — COMPREHENSIVE METABOLIC PANEL
ALT: <5 U/L (ref 0–44)
AST: 18 U/L (ref 15–41)
Albumin: 4.1 g/dL (ref 3.5–5.0)
Alkaline Phosphatase: 51 U/L (ref 38–126)
Anion gap: 10 (ref 5–15)
BUN: 20 mg/dL (ref 8–23)
CO2: 27 mmol/L (ref 22–32)
Calcium: 9.1 mg/dL (ref 8.9–10.3)
Chloride: 94 mmol/L — ABNORMAL LOW (ref 98–111)
Creatinine, Ser: 0.79 mg/dL (ref 0.61–1.24)
GFR, Estimated: >60 mL/min (ref >60)
Glucose, Bld: 97 mg/dL (ref 70–99)
Potassium: 3.9 mmol/L (ref 3.5–5.1)
Sodium: 131 mmol/L — ABNORMAL LOW (ref 135–145)
Total Bilirubin: 0.8 mg/dL (ref 0.3–1.2)
Total Protein: 7.1 g/dL (ref 6.5–8.1)

## 2021-03-24 LAB — SEDIMENTATION RATE: Sed Rate: 4 mm/hr (ref 0–20)

## 2021-03-24 LAB — MAGNESIUM: Magnesium: 2 mg/dL (ref 1.7–2.4)

## 2021-03-24 LAB — VITAMIN B12: Vitamin B-12: 471 pg/mL (ref 180–914)

## 2021-03-24 LAB — C-REACTIVE PROTEIN: CRP: 0.5 mg/dL (ref <1.0)

## 2021-03-24 NOTE — Progress Notes (Signed)
Safety precautions to be maintained throughout the outpatient stay will include: orient to surroundings, keep bed in low position, maintain call bell within reach at all times, provide assistance with transfer out of bed and ambulation.  

## 2021-03-24 NOTE — Patient Instructions (Signed)
____________________________________________________________________________________________  General Risks and Possible Complications  Patient Responsibilities: It is important that you read this as it is part of your informed consent. It is our duty to inform you of the risks and possible complications associated with treatments offered to you. It is your responsibility as a patient to read this and to ask questions about anything that is not clear or that you believe was not covered in this document.  Patients Rights: You have the right to refuse treatment. You also have the right to change your mind, even after initially having agreed to have the treatment done. However, under this last option, if you wait until the last second to change your mind, you may be charged for the materials used up to that point.  Introduction: Medicine is not an Chief Strategy Officer. Everything in Medicine, including the lack of treatment(s), carries the potential for danger, harm, or loss (which is by definition: Risk). In Medicine, a complication is a secondary problem, condition, or disease that can aggravate an already existing one. All treatments carry the risk of possible complications. The fact that a side effects or complications occurs, does not imply that the treatment was conducted incorrectly. It must be clearly understood that these can happen even when everything is done following the highest safety standards.  No treatment: You can choose not to proceed with the proposed treatment alternative. The PRO(s) would include: avoiding the risk of complications associated with the therapy. The CON(s) would include: not getting any of the treatment benefits. These benefits fall under one of three categories: diagnostic; therapeutic; and/or palliative. Diagnostic benefits include: getting information which can ultimately lead to improvement of the disease or symptom(s). Therapeutic benefits are those associated with the  successful treatment of the disease. Finally, palliative benefits are those related to the decrease of the primary symptoms, without necessarily curing the condition (example: decreasing the pain from a flare-up of a chronic condition, such as incurable terminal cancer).  General Risks and Complications: These are associated to most interventional treatments. They can occur alone, or in combination. They fall under one of the following six (6) categories: no benefit or worsening of symptoms; bleeding; infection; nerve damage; allergic reactions; and/or death. No benefits or worsening of symptoms: In Medicine there are no guarantees, only probabilities. No healthcare provider can ever guarantee that a medical treatment will work, they can only state the probability that it may. Furthermore, there is always the possibility that the condition may worsen, either directly, or indirectly, as a consequence of the treatment. Bleeding: This is more common if the patient is taking a blood thinner, either prescription or over the counter (example: Goody Powders, Fish oil, Aspirin, Garlic, etc.), or if suffering a condition associated with impaired coagulation (example: Hemophilia, cirrhosis of the liver, low platelet counts, etc.). However, even if you do not have one on these, it can still happen. If you have any of these conditions, or take one of these drugs, make sure to notify your treating physician. Infection: This is more common in patients with a compromised immune system, either due to disease (example: diabetes, cancer, human immunodeficiency virus [HIV], etc.), or due to medications or treatments (example: therapies used to treat cancer and rheumatological diseases). However, even if you do not have one on these, it can still happen. If you have any of these conditions, or take one of these drugs, make sure to notify your treating physician. Nerve Damage: This is more common when the treatment is an invasive  invasive one, but it can also happen with the use of medications, such as those used in the treatment of cancer. The damage can occur to small secondary nerves, or to large primary ones, such as those in the spinal cord and brain. This damage may be temporary or permanent and it may lead to impairments that can range from temporary numbness to permanent paralysis and/or brain death. Allergic Reactions: Any time a substance or material comes in contact with our body, there is the possibility of an allergic reaction. These can range from a mild skin rash (contact dermatitis) to a severe systemic reaction (anaphylactic reaction), which can result in death. Death: In general, any medical intervention can result in death, most of the time due to an unforeseen complication. ____________________________________________________________________________________________    

## 2021-03-25 ENCOUNTER — Ambulatory Visit (INDEPENDENT_AMBULATORY_CARE_PROVIDER_SITE_OTHER): Payer: Medicare HMO | Admitting: Psychiatry

## 2021-03-25 ENCOUNTER — Encounter: Payer: Self-pay | Admitting: Psychiatry

## 2021-03-25 DIAGNOSIS — Z7901 Long term (current) use of anticoagulants: Secondary | ICD-10-CM | POA: Diagnosis not present

## 2021-03-25 DIAGNOSIS — F331 Major depressive disorder, recurrent, moderate: Secondary | ICD-10-CM | POA: Diagnosis not present

## 2021-03-25 DIAGNOSIS — G894 Chronic pain syndrome: Secondary | ICD-10-CM | POA: Diagnosis not present

## 2021-03-25 DIAGNOSIS — F411 Generalized anxiety disorder: Secondary | ICD-10-CM

## 2021-03-25 DIAGNOSIS — Z79899 Other long term (current) drug therapy: Secondary | ICD-10-CM | POA: Diagnosis not present

## 2021-03-25 DIAGNOSIS — R69 Illness, unspecified: Secondary | ICD-10-CM | POA: Diagnosis not present

## 2021-03-25 MED ORDER — CLONIDINE HCL 0.1 MG PO TABS: 0.0500 mg | ORAL_TABLET | Freq: Two times a day (BID) | ORAL | 1 refills | Status: DC

## 2021-03-25 NOTE — Progress Notes (Signed)
Joseph [place] [id number] 12/06/1940 81 y.o.  Video Visit via My Chart  I connected with pt by My Chart and verified that I am speaking with the correct person using two identifiers.   I discussed the limitations, risks, security and privacy concerns of performing an evaluation and management service by My Chart  and the availability of in person appointments. I also discussed with the patient that there may be a patient responsible charge related to this service. The patient expressed understanding and agreed to proceed.  I discussed the assessment and treatment plan with the patient. The patient was provided an opportunity to ask questions and all were answered. The patient agreed with the plan and demonstrated an understanding of the instructions.   The patient was advised to call back or seek an in-person evaluation if the symptoms worsen or if the condition fails to improve as anticipated.  I provided 30 minutes of video time during this encounter.  The patient was located at home and the provider was located office. Session from 4 until 4:30 PM  Subjective:   Patient ID:  Joseph [place] is a 81 y.o. (DOB [birth year]) male.  Chief Complaint:  Chief Complaint  Patient presents with   Follow-up   Anxiety   Depression    Depression        Associated symptoms include no fatigue.  Past medical history includes anxiety.   Anxiety Patient reports no dizziness or palpitations.    Joseph [place] presents to the office today for follow-up of major depression and generalized anxiety disorder.    He was seen December, 2020 & June 2021.  No meds were changed.  Continued paroxetine 60 mg daily plus gabapentin 100 mg twice daily and Mirapex 0.125 mg 3 times daily.   08/2019 appt without med changes noted: Tolerates the meds well.  Wife thinks he's doing very well and he agrees.   No complaints.  2 D's.   Still exercising and has fatigue but can function and enjoys  activity. Restarted men's Bible study.  He feels positive and hopeful and useful.  At some point would like to be free of medication.  Does not feel guilty about the meds.    03/19/2020 appt noted:  Seen with wife Joseph [place] August and took Ivermectin and both had it mild. As far mood concerned he's doing well.  Wife agrees.  Not markedly depressed or anxious.  Feels the suffering of others.  Joseph [place].  Friend with Covid also. No episodes. Tolerating meds.  No Xanax used.  Attend [employer] in [place]. Usually sleep well but back pain can interfere with sleep. TX for PD and a bit slower per wife. Plan: Try increasing gabapentin to 100 mg AM & 200 mg PM for back pain.   Continue paroxetine 60 mg daily and Deplin 15 mg daily  10/03/2020 appointment with the following noted: Seen with wife, Joseph [place] Off and on increased gabapentin at PM to 200 mg for back pain and sleeps better.   Not depressed.  Anxiety is manageable.   Seeing chiropracter who he likes Joseph [place] in [place]. No SE noted with meds.  Perspire heavily. No panic attacks.   Sleep variable and is usually OK. Still leads men's Bible Study. Uses [employer] Plan: No med change  03/25/2021 appointment with the following noted: with wife Joseph [place] but came on quickly. Shaking with anxiety and been going on for 3-4 weeks. Rare use of  Xanax. A lot of physical px with constant pain with back. Pending workup.  Fell 3 weeks ago. T pain clinic now.  Everything seemed to pile up. Insomnia with mind racing with worry.  He can't tolerate negativity.   Rare panic. Consistent with paroxetine 60 mg daily. Consistent with gabapentin 100 AM and 200 PM  Was 12 years free of medication until this last relapse and yet it took a long time to get relief again.  Wife, Joseph [place], said overall he's been doing well.  No prn BZ in a couple of years.  Past Psychiatric Medication Trials: Under our care since  2013. Paroxetine 60, duloxetine, fluoxetine, Luvox, venlafaxine, Trintellix, , mirtazapine, gabapentin, Deplin, clonazepam, lorazepam  lamotrigine,  buspirone,   Abilify, lithium with some benefit, Seroquel, Zyprexa,  risperidone, Saphris, doxepin,  pramipexole  Review of Systems:  Review of Systems  Constitutional:  Negative for fatigue.  Cardiovascular:  Negative for palpitations.  Musculoskeletal:  Positive for back pain and gait problem.  Neurological:  Positive for tremors and weakness. Negative for dizziness.   Medications: I have reviewed the patient's current medications.  Current Outpatient Medications  Medication Sig Dispense Refill   ALPRAZolam (XANAX) 0.25 MG tablet Take 1 tablet (0.25 mg total) by mouth 3 (three) times daily as needed for anxiety. 30 tablet 0   B Complex Vitamins (VITAMIN B COMPLEX PO) Take 1 tablet by mouth daily.     baclofen (LIORESAL) 10 MG tablet TAKE 1/2-1 TABLET BY MOUTH TWICE A DAY AS NEEDED FOR MUSCLE SPASMS 180 tablet 1   Capsicum-Garlic 403-474 MG CAPS Take 200-300 mg by mouth.     carbidopa-levodopa (SINEMET IR) 25-100 MG tablet Take 2 tablets by mouth 3 (three) times daily.     cloNIDine (CATAPRES) 0.1 MG tablet Take 0.5 tablets (0.05 mg total) by mouth 2 (two) times daily. 30 tablet 1   clotrimazole-betamethasone (LOTRISONE) cream Apply 1-2 times a day for worsening flare dry skin dermatitis of toes/feet, may re-use daily up to 1 week as needed. 30 g 1   co-enzyme Q-10 30 MG capsule Take 30 mg by mouth daily.     gabapentin (NEURONTIN) 100 MG capsule 1 CAPSULE BY MOUTH IN THE AM AND 2 CAPSULES IN EVENING 270 capsule 1   HAWTHORNE BERRY PO Take by mouth daily.     ibuprofen (ADVIL,MOTRIN) 200 MG tablet Take 200 mg by mouth every 6 (six) hours as needed for moderate pain.     L-Methylfolate 15 MG TABS Take 1 tablet (15 mg total) by mouth daily. 90 tablet 3   Lutein 10 MG TABS Take 1 tablet by mouth 3 (three) times daily. 1 daily     Magnesium  200 MG TABS Take 200 mg by mouth in the morning and at bedtime.     omega-3 acid ethyl esters (LOVAZA) 1 g capsule Take 1 capsule by mouth daily.     PARoxetine (PAXIL) 20 MG tablet TAKE 1 TABLET BY MOUTH EVERY DAY (WITH THE 40 MG TABLET FOR 60 MG TOTAL) 90 tablet 1   PARoxetine (PAXIL) 40 MG tablet TAKE 1 TABLET BY MOUTH EVERY DAY (WITH THE 20 MG TABLET FOR 60 MG TOTAL) 90 tablet 1   pramipexole (MIRAPEX) 0.125 MG tablet Take 0.125 mg by mouth at bedtime. 1 tab at bedtime     PSYLLIUM HUSK PO Take by mouth.     Saw Palmetto, Serenoa repens, 1000 MG CAPS Take 2 capsules by mouth daily.     No current facility-administered  medications for this visit.    Medication Side Effects: Fatigue and Other: from Mirapex  esp in AM  Allergies:  Allergies  Allergen Reactions   Prednisone     Hyperactivity    Wheat Bran Diarrhea    Past Medical History:  Diagnosis Date   Allergic rhinitis due to pollen 11/21/2007   Brachial neuritis or radiculitis NOS    Cervicalgia    Concussion    age 32 - s/p accident   Costal chondritis    Depression    Essential and other specified forms of tremor    GERD (gastroesophageal reflux disease)    in past   Lumbago    Occlusion and stenosis of carotid artery without mention of cerebral infarction    Seizures ([place])    age 30 - after concussion    Family History  Problem Relation Age of Onset   Heart failure Mother        enlarged heart    Asthma Father     Social History   Socioeconomic History   Marital status: Married    Spouse name: Joseph [place] Jimerson   Number of children: 2   Years of education: 12   Highest education level: Master's degree (e.g., MA, MS, MEng, MEd, MSW, MBA)  Occupational History   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[employer])    Employer: RETIRED  Tobacco Use   Smoking status: Never   Smokeless tobacco: Never  Vaping Use   Vaping Use: Never used  Substance and Sexual Activity   Alcohol use: No    Alcohol/week: 0.0 standard  drinks   Drug use: No   Sexual activity: Not Currently  Other Topics Concern   Not on file  Social History Narrative   Married to [place], has 2 children, has grandchildren   Right handed   Master's plus   He is born in [place], heritage is [place] (Father's side), [place] (Mother's side)      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    Social Determinants of Radio broadcast assistant Strain: Not on file  Food Insecurity: Not on file  Transportation Needs: Not on file  Physical Activity: Not on file  Stress: Not on file  Social Connections: Not on file  Intimate Partner Violence: Not on file    Past Medical History, Surgical history, Social history, and Family history were reviewed and updated as appropriate.   Please see review of systems for further details on the patient's review from today.   Objective:   Physical Exam:  There were no vitals taken for this visit.  Physical Exam Constitutional:      General: He is not in acute distress.    Appearance: He is well-developed.  Musculoskeletal:        General: No deformity.  Neurological:     Mental Status: He is alert and oriented to person, place, and time.     Motor: Tremor present.     Coordination: Coordination normal.  Psychiatric:        Attention and Perception: Attention and perception normal. He does not perceive auditory or visual hallucinations.        Mood and Affect: Mood is anxious. Mood is not depressed. Affect is not labile, blunt or angry.        Speech: Speech normal.        Behavior: Behavior normal.        Thought Content: Thought content normal. Thought content is not delusional. Thought content does not include homicidal or  suicidal ideation. Thought content does not include suicidal plan.        Cognition and Memory: Cognition and memory normal.        Judgment: Judgment normal.     Comments: Insight intact.  Talkative. No delusions.     Lab Review:     Component Value Date/Time   NA 131 (L)  03/24/2021 1351   NA 143 07/10/2015 0821   K 3.9 03/24/2021 1351   CL 94 (L) 03/24/2021 1351   CO2 27 03/24/2021 1351   GLUCOSE 97 03/24/2021 1351   BUN 20 03/24/2021 1351   BUN 13 07/10/2015 0821   CREATININE 0.79 03/24/2021 1351   CREATININE 0.76 09/23/2020 0911   CALCIUM 9.1 03/24/2021 1351   PROT 7.1 03/24/2021 1351   PROT 6.8 07/10/2015 0821   ALBUMIN 4.1 03/24/2021 1351   ALBUMIN 4.2 07/10/2015 0821   AST 18 03/24/2021 1351   ALT <5 03/24/2021 1351   ALKPHOS 51 03/24/2021 1351   BILITOT 0.8 03/24/2021 1351   BILITOT 0.5 07/10/2015 0821   GFRNONAA >60 03/24/2021 1351   GFRNONAA 86 09/07/2019 0941   GFRAA 100 09/07/2019 0941       Component Value Date/Time   WBC 6.5 09/23/2020 0911   RBC 4.86 09/23/2020 0911   HGB 15.2 09/23/2020 0911   HGB 15.3 10/23/2014 1237   HCT 45.2 09/23/2020 0911   HCT 45.1 10/23/2014 1237   PLT 183 09/23/2020 0911   PLT 150 10/23/2014 1237   MCV 93.0 09/23/2020 0911   MCV 88 10/23/2014 1237   MCH 31.3 09/23/2020 0911   MCHC 33.6 09/23/2020 0911   RDW 12.2 09/23/2020 0911   RDW 13.3 10/23/2014 1237   LYMPHSABS 1,411 09/23/2020 0911   LYMPHSABS 1.7 10/23/2014 1237   MONOABS 357 07/24/2016 1050   EOSABS 371 09/23/2020 0911   EOSABS 0.3 10/23/2014 1237   BASOSABS 72 09/23/2020 0911   BASOSABS 0.1 10/23/2014 1237    No results found for: POCLITH, LITHIUM   No results found for: PHENYTOIN, PHENOBARB, VALPROATE, CBMZ   .res Assessment: Plan:    Cashius was seen today for follow-up, anxiety and depression.  Diagnoses and all orders for this visit:  GAD (generalized anxiety disorder) -     cloNIDine (CATAPRES) 0.1 MG tablet; Take 0.5 tablets (0.05 mg total) by mouth 2 (two) times daily.  Major depressive disorder, recurrent episode, moderate (HCC)    History of extremely severe TR depression and anxiety with multiple med failures.  Doing well for 2-3 years.    Unfortunately he has had a severe relapse of anxiety over the last  several weeks.  It is contributed to by pain and advancing Parkinson's disease.  He has been consistent with the paroxetine  Still works out at gym.  Disc pricing L-methylfolate with [facility]. Continue paroxetine 60 mg daily  Parkiinson's is better with meds. DT PD hesitate to use atypicals for TR anxiety.  Consider increasing gabapentin for anxiety off label.   Ok prn Xanax   Clonidine 0.1 1/2 tablet at night for 2 nights then 1/2 tablet twice daily   Disc SE incl fall risk.  Push fluids.  Fall precautions.  This is off label but often provides fairly rapid relief of anxiety.  He has alprazolam but it does not provide adequate control  FU 6 weeks  Lynder Parents, MD, DFAPA   Please see After Visit Summary for patient specific instructions.  Future Appointments  Date Time Provider [place]  04/21/2021  8:00 AM Milinda Pointer, MD [facility] None  05/15/2021  8:30 AM [facility] 1 [facility] None  05/15/2021  9:30 AM Schnier, Dolores Lory, MD [facility] None  07/03/2021  2:00 PM Cottle, Billey Co., MD [facility] None    No orders of the defined types were placed in this encounter.    -------------------------------

## 2021-03-31 LAB — 25-HYDROXY VITAMIN D LCMS D2+D3
25-Hydroxy, Vitamin D-2: <1 ng/mL
25-Hydroxy, Vitamin D-3: 31 ng/mL
25-Hydroxy, Vitamin D: 32 ng/mL

## 2021-03-31 LAB — COMPLIANCE DRUG ANALYSIS, UR

## 2021-04-15 ENCOUNTER — Other Ambulatory Visit: Payer: Self-pay

## 2021-04-15 ENCOUNTER — Telehealth: Payer: Self-pay

## 2021-04-15 ENCOUNTER — Telehealth: Payer: Self-pay | Admitting: Pain Medicine

## 2021-04-15 MED ORDER — ALPRAZOLAM 0.25 MG PO TABS: 0.2500 mg | ORAL_TABLET | Freq: Three times a day (TID) | ORAL | 1 refills | Status: DC | PRN

## 2021-04-15 NOTE — Telephone Encounter (Signed)
Also his doctor gave him clonidine 01 mg take .05 2x a day. She wanted to let you know.

## 2021-04-15 NOTE — Telephone Encounter (Signed)
Wife is concerned because the patient shakes very badly, he will need something to help him lie in the MRI machine and not move. Please advise patient. He is schedule 04-22-21 for MRI

## 2021-04-15 NOTE — Telephone Encounter (Signed)
I had trouble with the insurance communication and finally got the authorization and MRI scheduled for 04/24/21 and changed his follow up to 04/28/21. HE is in a lot of pain and wants to know if he could get some pain medicine to last until he can get in here. He is in a lot of pain.

## 2021-04-15 NOTE — Telephone Encounter (Signed)
Called patient to let him know that [facility] would not be prescribing at this time.  He is scheduled for MRI on 2/23 and then f/up here on 02/27.  I told him that he could discuss further at that time.

## 2021-04-16 ENCOUNTER — Other Ambulatory Visit: Payer: Self-pay | Admitting: Psychiatry

## 2021-04-16 DIAGNOSIS — F411 Generalized anxiety disorder: Secondary | ICD-10-CM

## 2021-04-17 ENCOUNTER — Encounter: Payer: Self-pay | Admitting: Family Medicine

## 2021-04-17 DIAGNOSIS — H353131 Nonexudative age-related macular degeneration, bilateral, early dry stage: Secondary | ICD-10-CM | POA: Diagnosis not present

## 2021-04-21 ENCOUNTER — Ambulatory Visit: Payer: Medicare Other | Admitting: Pain Medicine

## 2021-04-21 ENCOUNTER — Ambulatory Visit: Payer: Medicare HMO | Admitting: Pain Medicine

## 2021-04-22 ENCOUNTER — Other Ambulatory Visit: Payer: Self-pay

## 2021-04-22 ENCOUNTER — Ambulatory Visit
Admission: RE | Admit: 2021-04-22 | Discharge: 2021-04-22 | Disposition: A | Discharge status: Home / Self Care | Payer: Medicare HMO | Source: Ambulatory Visit | Attending: Pain Medicine | Admitting: Pain Medicine

## 2021-04-22 DIAGNOSIS — M79605 Pain in left leg: Secondary | ICD-10-CM | POA: Diagnosis not present

## 2021-04-22 DIAGNOSIS — M4316 Spondylolisthesis, lumbar region: Secondary | ICD-10-CM | POA: Diagnosis not present

## 2021-04-22 DIAGNOSIS — M5417 Radiculopathy, lumbosacral region: Secondary | ICD-10-CM | POA: Insufficient documentation

## 2021-04-22 DIAGNOSIS — M4310 Spondylolisthesis, site unspecified: Secondary | ICD-10-CM | POA: Diagnosis not present

## 2021-04-22 DIAGNOSIS — M545 Low back pain, unspecified: Secondary | ICD-10-CM | POA: Insufficient documentation

## 2021-04-22 DIAGNOSIS — G8929 Other chronic pain: Secondary | ICD-10-CM | POA: Diagnosis not present

## 2021-04-22 DIAGNOSIS — M79604 Pain in right leg: Secondary | ICD-10-CM | POA: Insufficient documentation

## 2021-04-22 DIAGNOSIS — M48061 Spinal stenosis, lumbar region without neurogenic claudication: Secondary | ICD-10-CM | POA: Diagnosis not present

## 2021-04-24 ENCOUNTER — Ambulatory Visit: Payer: Medicare HMO

## 2021-04-24 DIAGNOSIS — R937 Abnormal findings on diagnostic imaging of other parts of musculoskeletal system: Secondary | ICD-10-CM | POA: Insufficient documentation

## 2021-04-24 NOTE — Progress Notes (Signed)
PROVIDER NOTE: Information contained herein reflects review and annotations entered in association with encounter. Interpretation of such information and data should be left to medically-trained personnel. Information provided to patient can be located elsewhere in the medical record under "Patient Instructions". Document created using STT-dictation technology, any transcriptional errors that may result from process are unintentional.    Patient: [place]  Service Category: E/M  Provider: Gaspar Cola, MD  DOB: [birth year]  DOS: 04/28/2021  Specialty: Interventional Pain Management  MRN: [id number]  Setting: Ambulatory outpatient  PCP: Olin Hauser, DO  Type: Established Patient    Referring Provider: Nobie Putnam *  Location: Office  Delivery: Face-to-face     Primary Reason(s) for Visit: Encounter for evaluation before starting new chronic pain management plan of care (Level of risk: moderate) CC: Back Pain (lower)  HPI  Mr. Ozga is a 81 y.o. year old, male patient, who comes today for a follow-up evaluation to review the test results and decide on a treatment plan. He has Bilateral carotid artery stenosis; Tremor; Anxiety; Major depression, recurrent, full remission ([place]); Unsteady gait; Orthostatic hypotension; Parkinson's disease ([place]); Chronic fatigue; BPH without obstruction/lower urinary tract symptoms; Vitamin D insufficiency; Chronic knee pain (Left); Derangement of medial meniscus, posterior horn (Left); Umbilical hernia; GAD (generalized anxiety disorder); MDD (major depressive disorder); PAD (peripheral artery disease) ([place]); Chronic anxiety; Depression; Chronic pain syndrome; Pharmacologic therapy; Disorder of skeletal system; Problems influencing health status; Long term prescription benzodiazepine use (alprazolam) (Xanax); Chronic anticoagulation (Plavix); Grade 1 Retrolisthesis of L2/L3 and L3/L4; Levoscoliosis of lumbar spine (L3-4 apex); DDD  (degenerative disc disease), lumbosacral; Lumbosacral facet arthropathy (Left: L3-4, L4-5, and L5-S1); Tricompartment osteoarthritis of knee (Left); Baker cyst (Left); Chronic low back pain (1ry area of Pain) (Bilateral) (R>L) w/o sciatica; Lumbar facet syndrome (Bilateral); Chronic lower extremity pain (2ry area of Pain) (Bilateral) (L>R); Lumbosacral radiculopathy at L2 (Bilateral); Lumbosacral radiculopathy at L3 (Bilateral); and Abnormal MRI, lumbar spine (04/23/2021) on their problem list. His primarily concern today is the Back Pain (lower)  Pain Assessment: Location: Lower Back Radiating: across back and sometime around to upper legs bilateral Onset: More than a month ago Duration: Chronic pain Quality: Aching, Stabbing Severity: 10-Worst pain ever/10 (subjective, self-reported pain score)  Effect on ADL: limiaties activites Timing: Constant Modifying factors: tems, ice, lying still, tens BP: (!) 90/59   HR: (!) 59  Mr. Kowal comes in today for a follow-up visit after his initial evaluation on 04/15/2021. Today we went over the results of his tests. These were explained in "Layman's terms". During today's appointment we went over my diagnostic impression, as well as the proposed treatment plan.  Review of initial evaluation (03/24/2021): "According to the patient the primary area of pain is that of the lower back (Bilateral) (R>L).  The patient denies any prior lumbar surgeries or MRIs.  The patient does admit to some recent x-rays of the lumbar spine.  He also admits to having had physical therapy at "[facility]" x2.  He refers that it occasionally will help him but in some other instances such as makes the pain worse.  The patient denies any prior nerve blocks.  Physical exam using provocative hyperextension on rotation and the [facility] maneuver proved to be positive bilaterally for lumbar facet arthralgia, which the patient indicated exactly reproduced his low back pain.  The  patient's secondary area pain is that of the lower extremities (Bilateral) (L>R).  In the case of the left lower extremity the patient indicates  having pain that goes over the anterior thigh area down to the level of the knee.  In the case of the right lower extremity it also goes through the exact same area.  This pain tends to be a shooting, electrical-like sensation that he gets with certain types of movements of his lumbar spine.  He denies any pain from the knee down except for occasional left heel pain.  On physical exam the patient demonstrated difficulty standing up and the pain pattern seems to follow that of and L2/L3 dermatome, bilaterally.  The patient's third area pain is that of the left heel with pain that he describes to be as intermittent, burning, and it typically happens when he lays down on his right side.  This pain will go away when he gets up out of the bed and he walks around.  It occurs only at night.  He refers that when he sleeps on his back he tends not to have this pain.  Pharmacotherapy: The patient indicates taking gabapentin 100 mg p.o. daily in a.m. and 200 mg at bedtime.  He also has taken baclofen, Flexeril, and he was recently given some Xanax for anxiety.  He also has taken meloxicam, Tylenol, and ibuprofen, which he describes as being helpful.  The patient indicates having intolerance to the use of oral prednisone were it makes him very nervous and hyperactive.  Other medical problems: The patient has significant tremors that have been worsening secondary to Parkinson's.  He denies having epilepsy or any kind of seizures.  He is parking since he is being treated at [facility]."  The patient returns today for follow-up evaluation.  He came in today with his wife and his daughter.  I have explained the results of the testing to both of them and I have gone over the MRI in great detail using layman's terms to explained the significant findings.  We talked about the  neck step and how I have reached the decision to proceed with the bilateral lumbar facet block first since his primary pain is that of the lower back and he denies any pain in the groin area or thigh area.  I have explained in detail the diagnostic lumbar facet block including the role that the local anesthetic and steroids played in the diagnosis of his low back pain.  I have also entered a referral to physical therapy and for them to evaluate him for a back brace.  The back brace was a request from the daughter who happens to have a friend but it is a physical therapist and they were inquiring as to whether or not it would be useful.  I have talked to them about the back brace and the need to make sure that he does not use it on a regular basis and what is it that the back brace can help him with and what the possible problems may be with becoming dependent on it.  I allowed him enough time to ask questions, all of which I have answered to their satisfaction.  Controlled Substance Pharmacotherapy Assessment REMS (Risk Evaluation and Mitigation Strategy)  Opioid Analgesic: None MME/day: 0 mg/day  Pill Count: None expected due to no prior prescriptions written by our practice. Ignatius Specking, RN  04/28/2021  2:01 PM  Sign when Signing Visit Safety precautions to be maintained throughout the outpatient stay will include: orient to surroundings, keep bed in low position, maintain call bell within reach at all times, provide assistance with transfer  out of bed and ambulation.    Pharmacokinetics: Liberation and absorption (onset of action): WNL Distribution (time to peak effect): WNL Metabolism and excretion (duration of action): WNL         Pharmacodynamics: Desired effects: Analgesia: Mr. Martis reports >50% benefit. Functional ability: Patient reports that medication allows him to accomplish basic ADLs Clinically meaningful improvement in function (CMIF): Sustained CMIF goals met Perceived  effectiveness: Described as relatively effective, allowing for increase in activities of daily living (ADL) Undesirable effects: Side-effects or Adverse reactions: None reported Monitoring: [place] PMP: PDMP reviewed during this encounter. Online review of the past 53-monthperiod previously conducted. Not applicable at this point since we have not taken over the patient's medication management yet. List of other Serum/Urine Drug Screening Test(s):  No results found for: AMPHSCRSER, BARBSCRSER, BENZOSCRSER, CKelly[place] COCAINSCRNUR, PBuchanan TLoma TPatriot CANNABQUANT, [place] PCrugers EIsabelaList of all UDS test(s) done:  Lab Results  Component Value Date   SUMMARY Note 03/25/2021   Last UDS on record: Summary  Date Value Ref Range Status  03/25/2021 Note  Final    Comment:    ==================================================================== Compliance Drug Analysis, Ur ==================================================================== Test                             Result       Flag       Units  Drug Present and Declared for Prescription Verification   Gabapentin                     PRESENT      EXPECTED   Baclofen                       PRESENT      EXPECTED   Paroxetine                     PRESENT      EXPECTED   Ibuprofen                      PRESENT      EXPECTED  Drug Present not Declared for Prescription Verification   Acetaminophen                  PRESENT      UNEXPECTED  Drug Absent but Declared for Prescription Verification   Alprazolam                     Not Detected UNEXPECTED ng/mg creat ==================================================================== Test                      Result    Flag   Units      Ref Range   Creatinine              122              mg/dL      >=20 ==================================================================== Declared Medications:  The flagging and interpretation on this report are based on the   following declared medications.  Unexpected results may arise from  inaccuracies in the declared medications.   **Note: The testing scope of this panel includes these medications:   Alprazolam  Baclofen (Lioresal)  Gabapentin (Neurontin)  Paroxetine (Paxil)   **Note: The testing scope of this panel does not include small to  moderate amounts of these reported medications:   Ibuprofen (  Advil)   **Note: The testing scope of this panel does not include the  following reported medications:   Betamethasone (Lotrisone)  Carbidopa (Sinemet)  Clotrimazole (Lotrisone)  Folic Acid  Levodopa (Sinemet)  Lutein  Magnesium  Omega-3 Fatty Acids  Pramipexole (Mirapex)  Supplement  Ubiquinone (CoQ10)  Vitamin B ==================================================================== For clinical consultation, please call [phone]. ====================================================================    UDS interpretation: No unexpected findings.          Medication Assessment Form: Not applicable. Treatment compliance: Not applicable Risk Assessment Profile: Aberrant behavior: See initial evaluations. None observed or detected today Comorbid factors increasing risk of overdose: See initial evaluation. No additional risks detected today Opioid risk tool (ORT):  Opioid Risk  03/24/2021  Alcohol 0  Illegal Drugs 0  Rx Drugs 0  Alcohol 0  Illegal Drugs 0  Rx Drugs 0  Age between 16-45 years  0  History of Preadolescent Sexual Abuse 0  Psychological Disease 0  Depression 1  Opioid Risk Tool Scoring 1  Opioid Risk Interpretation Low Risk    ORT Scoring interpretation table:  Score <3 = Low Risk for SUD  Score between 4-7 = Moderate Risk for SUD  Score >8 = High Risk for Opioid Abuse   Risk of substance use disorder (SUD): Low  Risk Mitigation Strategies:  Patient opioid safety counseling: No controlled substances prescribed. Patient-Prescriber Agreement (PPA): No agreement  signed.  Controlled substance notification to other providers: None required. No opioid therapy.  Pharmacologic Plan: Non-opioid analgesic therapy offered. Interventional alternatives discussed.             Laboratory Chemistry Profile   Renal Lab Results  Component Value Date   BUN 20 03/24/2021   CREATININE 0.79 93/23/5573   BCR NOT APPLICABLE 22/04/5425   GFRAA 100 09/07/2019   GFRNONAA >60 03/24/2021     Electrolytes Lab Results  Component Value Date   NA 131 (L) 03/24/2021   K 3.9 03/24/2021   CL 94 (L) 03/24/2021   CALCIUM 9.1 03/24/2021   MG 2.0 03/24/2021     Hepatic Lab Results  Component Value Date   AST 18 03/24/2021   ALT <5 03/24/2021   ALBUMIN 4.1 03/24/2021   ALKPHOS 51 03/24/2021     ID Lab Results  Component Value Date   SARSCOV2NAA POSITIVE 10/11/2019     Bone Lab Results  Component Value Date   VD25OH 44 09/23/2020   25OHVITD1 32 03/24/2021   25OHVITD2 <1.0 03/24/2021   25OHVITD3 31 03/24/2021     Endocrine Lab Results  Component Value Date   GLUCOSE 97 03/24/2021   HGBA1C 5.0 09/23/2020   TSH 1.59 09/07/2019     Neuropathy Lab Results  Component Value Date   VITAMINB12 471 03/24/2021   HGBA1C 5.0 09/23/2020     CNS No results found for: COLORCSF, APPEARCSF, RBCCOUNTCSF, WBCCSF, POLYSCSF, LYMPHSCSF, EOSCSF, PROTEINCSF, GLUCCSF, JCVIRUS, CSFOLI, IGGCSF, LABACHR, ACETBL, LABACHR, ACETBL   Inflammation (CRP: Acute   ESR: Chronic) Lab Results  Component Value Date   CRP 0.5 03/24/2021   ESRSEDRATE 4 03/24/2021     Rheumatology No results found for: RF, ANA, LABURIC, URICUR, LYMEIGGIGMAB, LYMEABIGMQN, HLAB27   Coagulation Lab Results  Component Value Date   PLT 183 09/23/2020     Cardiovascular Lab Results  Component Value Date   HGB 15.2 09/23/2020   HCT 45.2 09/23/2020     Screening Lab Results  Component Value Date   SARSCOV2NAA POSITIVE 10/11/2019     Cancer No results  found for: CEA, CA125, LABCA2    Allergens No results found for: ALMOND, APPLE, ASPARAGUS, AVOCADO, BANANA, BARLEY, BASIL, BAYLEAF, GREENBEAN, LIMABEAN, WHITEBEAN, BEEFIGE, REDBEET, BLUEBERRY, BROCCOLI, CABBAGE, MELON, CARROT, CASEIN, CASHEWNUT, CAULIFLOWER, CELERY     Note: Lab results reviewed.  Recent Diagnostic Imaging Review  Lumbosacral Imaging: Lumbar MR wo contrast: Results for orders placed during the hospital encounter of 04/22/21 MR LUMBAR SPINE WO CONTRAST  Narrative CLINICAL DATA:  Chronic low back pain.  EXAM: MRI LUMBAR SPINE WITHOUT CONTRAST  TECHNIQUE: Multiplanar, multisequence MR imaging of the lumbar spine was performed. No intravenous contrast was administered.  COMPARISON:  Lumbar spine radiographs 03/24/2021  FINDINGS: Segmentation:  Standard.  Alignment: Severe lumbar levoscoliosis. 5 mm retrolisthesis of L2 on L3 and 3 mm retrolisthesis of L3 on L4.  Vertebrae: No fracture or suspicious marrow lesion. Prominent degenerative endplate changes at M2-5 and L3-4 including moderate degenerative edema. Mild left-sided degenerative endplate edema at O0-B7. Hemangiomas in the T11 and L1 vertebral bodies.  Conus medullaris and cauda equina: Conus extends to the L1 level. Conus and cauda equina appear normal.  Paraspinal and other soft tissues: Unremarkable.  Disc levels:  Disc desiccation and disc space narrowing throughout the lumbar spine including asymmetrically severe right-sided disc space narrowing at L2-3 and L3-4 and left-sided narrowing at L5-S1.  T11-12 and T12-L1: Negative.  L1-2: Mild disc bulging and mild facet hypertrophy without stenosis.  L2-3: Retrolisthesis with right eccentric bulging of uncovered disc and moderate facet hypertrophy result in moderate right and mild left lateral recess stenosis and mild bilateral neural foraminal stenosis without significant spinal stenosis. Potential right L3 nerve root impingement.  L3-4: Retrolisthesis with right eccentric  bulging of uncovered disc and severe facet and ligamentum flavum hypertrophy result in mild right lateral recess stenosis and moderate right and mild left neural foraminal stenosis with potential right L3 nerve root impingement. No spinal stenosis.  L4-5: Disc bulging and severe facet and ligamentum flavum hypertrophy result in mild spinal stenosis, mild left greater than right lateral recess stenosis, and no significant neural foraminal stenosis.  L5-S1: Left eccentric disc bulging, endplate spurring, asymmetric left-sided disc space height loss, and moderate to severe left facet hypertrophy result in moderate left neural foraminal stenosis without spinal stenosis.  IMPRESSION: 1. Severe lumbar levoscoliosis with widespread advanced disc and facet degeneration. 2. Mild spinal stenosis at L4-5. 3. Moderate neural foraminal stenosis on the right at L3-4 and on the left at L5-S1. 4. Moderate right lateral recess stenosis at L2-3.   Electronically Signed By: Logan Bores M.D. On: 04/23/2021 16:41  Lumbar DG (Complete) 4+V: Results for orders placed during the hospital encounter of 03/09/21 DG Lumbar Spine Complete  Narrative CLINICAL DATA:  Patient states that he was going up the steps up the house and fell back and landed on his back on Tuesday. Patient not sure if he hit his head. Patient also reports lower back pain.  EXAM: LUMBAR SPINE - COMPLETE 4+ VIEW  COMPARISON:  11/13/2020.  FINDINGS: No fracture.  No bone lesion.  Moderate to marked levoscoliosis, apex at L3-L4.  Grade 1 retrolisthesis of L2 on L3, and minimally of L3 and L4.  Moderate loss of disc height at L1-L2 and L2-L3. Mild loss of disc height at L3-L4 and L4-L5.  Facet degenerative changes on the left at L3-L4, 4-L5 and L5-S1.  Soft tissues are unremarkable.  No convincing change from the prior radiographs.  IMPRESSION: 1. No fracture or acute finding. 2. Significant levoscoliosis. Disc and  facet degenerative changes as detailed. No change from the prior radiographs.   Electronically Signed By: Lajean Manes M.D. On: 03/09/2021 15:19  Lumbar DG Bending views: Results for orders placed during the hospital encounter of 03/24/21 DG Lumbar Spine Complete W/Bend  Narrative CLINICAL DATA:  Back pain  EXAM: LUMBAR SPINE - COMPLETE WITH BENDING VIEWS  COMPARISON:  03/09/2021  FINDINGS: No recent fracture is seen. There is marked levoscoliosis. There is mild retrolisthesis at L2-L3 and L3-L4 levels. In the flexion and extension lateral views vertebral bodies are not optimally visualized, possibly related to marked levoscoliosis limiting evaluation for any instability. Degenerative changes are noted with disc space narrowing, bony spurs and facet hypertrophy throughout lumbar spine. Degenerative changes in the facet joints are particularly severe at L5-S1 level. No significant interval changes are noted.  IMPRESSION: No recent fracture is seen. Marked levoscoliosis. Degenerative changes are noted with disc space narrowing, bony spurs and facet hypertrophy at multiple levels. There is mild retrolisthesis at L2-L3 and L3-L4 levels. Overall, no significant interval changes are noted since 03/09/2021.   Electronically Signed By: Elmer Picker M.D. On: 03/25/2021 14:30  Knee Imaging: Knee-L DG 4 views: Results for orders placed during the hospital encounter of 07/24/16 DG Knee Complete 4 Views Left  Narrative CLINICAL DATA:  Left knee pain after injury several months ago.  EXAM: LEFT KNEE - COMPLETE 4+ VIEW  COMPARISON:  None.  FINDINGS: No evidence of fracture, dislocation, or joint effusion. No evidence of arthropathy or other focal bone abnormality. Vascular calcifications are noted.  IMPRESSION: No significant abnormality seen in the left knee.   Electronically Signed By: Marijo Conception, M.D. On: 07/24/2016 14:46  Complexity Note: Imaging  results reviewed. Results shared with Mr. Harrison, using Layman's terms.                        Meds   Current Outpatient Medications:    ALPRAZolam (XANAX) 0.25 MG tablet, Take 1 tablet (0.25 mg total) by mouth 3 (three) times daily as needed for anxiety., Disp: 90 tablet, Rfl: 1   B Complex Vitamins (VITAMIN B COMPLEX PO), Take 1 tablet by mouth daily., Disp: , Rfl:    baclofen (LIORESAL) 10 MG tablet, TAKE 1/2-1 TABLET BY MOUTH TWICE A DAY AS NEEDED FOR MUSCLE SPASMS, Disp: 180 tablet, Rfl: 1   Capsicum-Garlic 706-237 MG CAPS, Take 200-300 mg by mouth., Disp: , Rfl:    carbidopa-levodopa (SINEMET IR) 25-100 MG tablet, Take 2 tablets by mouth 3 (three) times daily., Disp: , Rfl:    cloNIDine (CATAPRES) 0.1 MG tablet, TAKE 1/2 TABLET (0.05 MG TOTAL) BY MOUTH TWICE A DAY, Disp: 30 tablet, Rfl: 1   clotrimazole-betamethasone (LOTRISONE) cream, Apply 1-2 times a day for worsening flare dry skin dermatitis of toes/feet, may re-use daily up to 1 week as needed., Disp: 30 g, Rfl: 1   co-enzyme Q-10 30 MG capsule, Take 30 mg by mouth daily., Disp: , Rfl:    gabapentin (NEURONTIN) 100 MG capsule, 1 CAPSULE BY MOUTH IN THE AM AND 2 CAPSULES IN EVENING, Disp: 270 capsule, Rfl: 1   HAWTHORNE BERRY PO, Take by mouth daily., Disp: , Rfl:    ibuprofen (ADVIL,MOTRIN) 200 MG tablet, Take 200 mg by mouth every 6 (six) hours as needed for moderate pain., Disp: , Rfl:    L-Methylfolate 15 MG TABS, Take 1 tablet (15 mg total) by mouth daily., Disp: 90 tablet, Rfl: 3   Lutein 10  MG TABS, Take 1 tablet by mouth 3 (three) times daily. 1 daily, Disp: , Rfl:    Magnesium 200 MG TABS, Take 200 mg by mouth in the morning and at bedtime., Disp: , Rfl:    omega-3 acid ethyl esters (LOVAZA) 1 g capsule, Take 1 capsule by mouth daily., Disp: , Rfl:    PARoxetine (PAXIL) 20 MG tablet, TAKE 1 TABLET BY MOUTH EVERY DAY (WITH THE 40 MG TABLET FOR 60 MG TOTAL), Disp: 90 tablet, Rfl: 1   PARoxetine (PAXIL) 40 MG tablet, TAKE 1  TABLET BY MOUTH EVERY DAY (WITH THE 20 MG TABLET FOR 60 MG TOTAL), Disp: 90 tablet, Rfl: 1   pramipexole (MIRAPEX) 0.125 MG tablet, Take 0.125 mg by mouth at bedtime. 1 tab at bedtime, Disp: , Rfl:    PSYLLIUM HUSK PO, Take by mouth., Disp: , Rfl:    Saw [place], Serenoa repens, 1000 MG CAPS, Take 2 capsules by mouth daily., Disp: , Rfl:   ROS  Constitutional: Denies any fever or chills Gastrointestinal: No reported hemesis, hematochezia, vomiting, or acute GI distress Musculoskeletal: Denies any acute onset joint swelling, redness, loss of ROM, or weakness Neurological: No reported episodes of acute onset apraxia, aphasia, dysarthria, agnosia, amnesia, paralysis, loss of coordination, or loss of consciousness  Allergies  Mr. Knoop is allergic to prednisone and wheat bran.  [place]  Drug: Mr. Brookens  reports no history of drug use. Alcohol:  reports no history of alcohol use. Tobacco:  reports that he has never smoked. He has never used smokeless tobacco. Medical:  has a past medical history of Allergic rhinitis due to pollen (11/21/2007), Brachial neuritis or radiculitis NOS, Cervicalgia, Concussion, Costal chondritis, Depression, Essential and other specified forms of tremor, GERD (gastroesophageal reflux disease), Lumbago, Occlusion and stenosis of carotid artery without mention of cerebral infarction, and Seizures ([place]). Surgical: Mr. Pittinger  has a past surgical history that includes Other surgical history (2002); Colonoscopy (2008); Stapedes surgery (Right, 2002); Cataract extraction (Right, 07/18/14); and Cataract extraction w/PHACO (Left, 08/01/2014). Family: family history includes Asthma in his father; Heart failure in his mother.  Constitutional Exam  General appearance: Well nourished, well developed, and well hydrated. In no apparent acute distress Vitals:   04/28/21 1349  BP: (!) 90/59  Pulse: (!) 59  Resp: 16  Temp: 98.1 F (36.7 C)  SpO2: 99%  Weight: 138 lb (62.6  kg)  Height: 5' 7" (1.702 m)   BMI Assessment: Estimated body mass index is 21.61 kg/m as calculated from the following:   Height as of this encounter: 5' 7" (1.702 m).   Weight as of this encounter: 138 lb (62.6 kg).  BMI interpretation table: BMI level Category Range association with higher incidence of chronic pain  <18 kg/m2 Underweight   18.5-24.9 kg/m2 Ideal body weight   25-29.9 kg/m2 Overweight Increased incidence by 20%  30-34.9 kg/m2 Obese (Class I) Increased incidence by 68%  35-39.9 kg/m2 Severe obesity (Class II) Increased incidence by 136%  >40 kg/m2 Extreme obesity (Class III) Increased incidence by 254%   Patient's current BMI Ideal Body weight  Body mass index is 21.61 kg/m. Ideal body weight: 66.1 kg (145 lb 11.6 oz)   BMI Readings from Last 4 Encounters:  04/28/21 21.61 kg/m  03/24/21 20.98 kg/m  03/17/21 20.98 kg/m  03/09/21 21.29 kg/m   Wt Readings from Last 4 Encounters:  04/28/21 138 lb (62.6 kg)  03/24/21 138 lb (62.6 kg)  03/17/21 138 lb (62.6 kg)  03/09/21 140 lb (  63.5 kg)    Psych/Mental status: Alert, oriented x 3 (person, place, & time)       Eyes: PERLA Respiratory: No evidence of acute respiratory distress  Assessment & Plan  Primary Diagnosis & Pertinent Problem List: The primary encounter diagnosis was Abnormal MRI, lumbar spine (04/23/2021). Diagnoses of Chronic low back pain (1ry area of Pain) (Bilateral) (R>L) w/o sciatica, Chronic lower extremity pain (2ry area of Pain) (Bilateral) (L>R), Grade 1 Retrolisthesis of L2/L3 and L3/L4, Levoscoliosis of lumbar spine (L3-4 apex), Lumbosacral facet arthropathy (Left: L3-4, L4-5, and L5-S1), Lumbar facet syndrome (Bilateral), Lumbosacral radiculopathy at L3 (Bilateral), Lumbosacral radiculopathy at L2 (Bilateral), and Chronic anticoagulation (Plavix) were also pertinent to this visit.  Visit Diagnosis: 1. Abnormal MRI, lumbar spine (04/23/2021)   2. Chronic low back pain (1ry area of Pain)  (Bilateral) (R>L) w/o sciatica   3. Chronic lower extremity pain (2ry area of Pain) (Bilateral) (L>R)   4. Grade 1 Retrolisthesis of L2/L3 and L3/L4   5. Levoscoliosis of lumbar spine (L3-4 apex)   6. Lumbosacral facet arthropathy (Left: L3-4, L4-5, and L5-S1)   7. Lumbar facet syndrome (Bilateral)   8. Lumbosacral radiculopathy at L3 (Bilateral)   9. Lumbosacral radiculopathy at L2 (Bilateral)   10. Chronic anticoagulation (Plavix)    Problems updated and reviewed during this visit: No problems updated.  Plan of Care  Pharmacotherapy (Medications Ordered): No orders of the defined types were placed in this encounter.  Procedure Orders         LUMBAR FACET(MEDIAL BRANCH NERVE BLOCK) MBNB     Lab Orders  No laboratory test(s) ordered today   Imaging Orders  No imaging studies ordered today   Referral Orders         Ambulatory referral to Physical Therapy      Pharmacological management options:  Opioid Analgesics: I will not be prescribing any opioids at this time Membrane stabilizer: I will not be prescribing any at this time Muscle relaxant: I will not be prescribing any at this time NSAID: I will not be prescribing any at this time Other analgesic(s): I will not be prescribing any at this time      Interventional Therapies  Risk   Complexity Considerations:   Estimated body mass index is 20.98 kg/m as calculated from the following:   Height as of this encounter: 5' 8" (1.727 m).   Weight as of this encounter: 138 lb (62.6 kg).    Planned   Pending:   Diagnostic bilateral lumbar facet block #1  Referral to physical therapy for physical therapy for his back as well and asked to get fitted for a back brace.   Under consideration:   Diagnostic bilateral lumbar facet MBB #1  Diagnostic bilateral L2 and/or L3 TFESI #1    Completed:   None at this time   Therapeutic   Palliative (PRN) options:   None established    Provider-requested follow-up: Return for  (Clinic) procedure: Dx (B) L-FCT Blk #1. Recent Visits Date Type Provider Dept  03/24/21 Office Visit Milinda Pointer, MD [facility]  Showing recent visits within past 90 days and meeting all other requirements Today's Visits Date Type Provider Dept  04/28/21 Office Visit Milinda Pointer, MD [facility]  Showing today's visits and meeting all other requirements Future Appointments No visits were found meeting these conditions. Showing future appointments within next 90 days and meeting all other requirements  Primary Care Physician: Olin Hauser, DO Note by: Gaspar Cola,  MD Date: 04/28/2021; Time: 3:11 PM

## 2021-04-28 ENCOUNTER — Encounter: Payer: Self-pay | Admitting: Pain Medicine

## 2021-04-28 ENCOUNTER — Other Ambulatory Visit: Payer: Self-pay

## 2021-04-28 ENCOUNTER — Ambulatory Visit: Payer: Medicare HMO | Attending: Pain Medicine | Admitting: Pain Medicine

## 2021-04-28 VITALS — BP 90/59 | HR 59 | Temp 98.1°F | Resp 16 | Ht 67.0 in | Wt 138.0 lb

## 2021-04-28 DIAGNOSIS — M4310 Spondylolisthesis, site unspecified: Secondary | ICD-10-CM | POA: Insufficient documentation

## 2021-04-28 DIAGNOSIS — M47817 Spondylosis without myelopathy or radiculopathy, lumbosacral region: Secondary | ICD-10-CM | POA: Insufficient documentation

## 2021-04-28 DIAGNOSIS — Z7901 Long term (current) use of anticoagulants: Secondary | ICD-10-CM | POA: Insufficient documentation

## 2021-04-28 DIAGNOSIS — M79605 Pain in left leg: Secondary | ICD-10-CM | POA: Insufficient documentation

## 2021-04-28 DIAGNOSIS — M545 Low back pain, unspecified: Secondary | ICD-10-CM | POA: Diagnosis not present

## 2021-04-28 DIAGNOSIS — M47816 Spondylosis without myelopathy or radiculopathy, lumbar region: Secondary | ICD-10-CM | POA: Diagnosis not present

## 2021-04-28 DIAGNOSIS — G8929 Other chronic pain: Secondary | ICD-10-CM | POA: Insufficient documentation

## 2021-04-28 DIAGNOSIS — R937 Abnormal findings on diagnostic imaging of other parts of musculoskeletal system: Secondary | ICD-10-CM | POA: Diagnosis not present

## 2021-04-28 DIAGNOSIS — M4186 Other forms of scoliosis, lumbar region: Secondary | ICD-10-CM | POA: Diagnosis not present

## 2021-04-28 DIAGNOSIS — M5417 Radiculopathy, lumbosacral region: Secondary | ICD-10-CM | POA: Diagnosis not present

## 2021-04-28 DIAGNOSIS — M79604 Pain in right leg: Secondary | ICD-10-CM | POA: Insufficient documentation

## 2021-04-28 NOTE — Patient Instructions (Addendum)
______________________________________________________________________  Preparing for Procedure with Sedation  NOTICE: Due to recent regulatory changes, starting on September 30, 2020, procedures requiring intravenous (IV) sedation will no longer be performed at the [facility].  These types of procedures are required to be performed at [facility] ambulatory surgery facility.  We are very sorry for the inconvenience.  Procedure appointments are limited to planned procedures: No Prescription Refills. No disability issues will be discussed. No medication changes will be discussed.  Instructions: Oral Intake: Do not eat or drink anything for at least 8 hours prior to your procedure. (Exception: Blood Pressure Medication. See below.) Transportation: A driver is required. You may not drive yourself after the procedure. Blood Pressure Medicine: Do not forget to take your blood pressure medicine with a sip of water the morning of the procedure. If your Diastolic (lower reading) is above 100 mmHg, elective cases will be cancelled/rescheduled. Blood thinners: These will need to be stopped for procedures. Notify our staff if you are taking any blood thinners. Depending on which one you take, there will be specific instructions on how and when to stop it. Diabetics on insulin: Notify the staff so that you can be scheduled 1st case in the morning. If your diabetes requires high dose insulin, take only  of your normal insulin dose the morning of the procedure and notify the staff that you have done so. Preventing infections: Shower with an antibacterial soap the morning of your procedure. Build-up your immune system: Take 1000 mg of Vitamin C with every meal (3 times a day) the day prior to your procedure. Antibiotics: Inform the staff if you have a condition or reason that requires you to take antibiotics before dental procedures. Pregnancy: If you are pregnant, call and cancel the procedure. Sickness: If  you have a cold, fever, or any active infections, call and cancel the procedure. Arrival: You must be in the facility at least 30 minutes prior to your scheduled procedure. Children: Do not bring children with you. Dress appropriately: Bring dark clothing that you would not mind if they get stained. Valuables: Do not bring any jewelry or valuables.  Reasons to call and reschedule or cancel your procedure: (Following these recommendations will minimize the risk of a serious complication.) Surgeries: Avoid having procedures within 2 weeks of any surgery. (Avoid for 2 weeks before or after any surgery). Flu Shots: Avoid having procedures within 2 weeks of a flu shots. (Avoid for 2 weeks before or after immunizations). Barium: Avoid having a procedure within 7-10 days after having had a radiological study involving the use of radiological contrast. (Myelograms, Barium swallow or enema study). Heart attacks: Avoid any elective procedures or surgeries for the initial 6 months after a "Myocardial Infarction" (Heart Attack). Blood thinners: It is imperative that you stop these medications before procedures. Let us know if you if you take any blood thinner.  Infection: Avoid procedures during or within two weeks of an infection (including chest colds or gastrointestinal problems). Symptoms associated with infections include: Localized redness, fever, chills, night sweats or profuse sweating, burning sensation when voiding, cough, congestion, stuffiness, runny nose, sore throat, diarrhea, nausea, vomiting, cold or Flu symptoms, recent or current infections. It is specially important if the infection is over the area that we intend to treat. Heart and lung problems: Symptoms that may suggest an active cardiopulmonary problem include: cough, chest pain, breathing difficulties or shortness of breath, dizziness, ankle swelling, uncontrolled high or unusually low blood pressure, and/or palpitations. If you are    experiencing any of these symptoms, cancel your procedure and contact your primary care physician for an evaluation.  Remember:  Regular Business hours are:  Monday to Thursday 8:00 AM to 4:00 PM  Provider's Schedule: Francisco Naveira, MD:  Procedure days: Tuesday and Thursday 7:30 AM to 4:00 PM  Bilal Lateef, MD:  Procedure days: Monday and Wednesday 7:30 AM to 4:00 PM ______________________________________________________________________  ____________________________________________________________________________________________  General Risks and Possible Complications  Patient Responsibilities: It is important that you read this as it is part of your informed consent. It is our duty to inform you of the risks and possible complications associated with treatments offered to you. It is your responsibility as a patient to read this and to ask questions about anything that is not clear or that you believe was not covered in this document.  Patient's Rights: You have the right to refuse treatment. You also have the right to change your mind, even after initially having agreed to have the treatment done. However, under this last option, if you wait until the last second to change your mind, you may be charged for the materials used up to that point.  Introduction: Medicine is not an exact science. Everything in Medicine, including the lack of treatment(s), carries the potential for danger, harm, or loss (which is by definition: Risk). In Medicine, a complication is a secondary problem, condition, or disease that can aggravate an already existing one. All treatments carry the risk of possible complications. The fact that a side effects or complications occurs, does not imply that the treatment was conducted incorrectly. It must be clearly understood that these can happen even when everything is done following the highest safety standards.  No treatment: You can choose not to proceed with the  proposed treatment alternative. The "PRO(s)" would include: avoiding the risk of complications associated with the therapy. The "CON(s)" would include: not getting any of the treatment benefits. These benefits fall under one of three categories: diagnostic; therapeutic; and/or palliative. Diagnostic benefits include: getting information which can ultimately lead to improvement of the disease or symptom(s). Therapeutic benefits are those associated with the successful treatment of the disease. Finally, palliative benefits are those related to the decrease of the primary symptoms, without necessarily curing the condition (example: decreasing the pain from a flare-up of a chronic condition, such as incurable terminal cancer).  General Risks and Complications: These are associated to most interventional treatments. They can occur alone, or in combination. They fall under one of the following six (6) categories: no benefit or worsening of symptoms; bleeding; infection; nerve damage; allergic reactions; and/or death. No benefits or worsening of symptoms: In Medicine there are no guarantees, only probabilities. No healthcare provider can ever guarantee that a medical treatment will work, they can only state the probability that it may. Furthermore, there is always the possibility that the condition may worsen, either directly, or indirectly, as a consequence of the treatment. Bleeding: This is more common if the patient is taking a blood thinner, either prescription or over the counter (example: Goody Powders, Fish oil, Aspirin, Garlic, etc.), or if suffering a condition associated with impaired coagulation (example: Hemophilia, cirrhosis of the liver, low platelet counts, etc.). However, even if you do not have one on these, it can still happen. If you have any of these conditions, or take one of these drugs, make sure to notify your treating physician. Infection: This is more common in patients with a compromised  immune system, either due to disease (example:   diabetes, cancer, human immunodeficiency virus [HIV], etc.), or due to medications or treatments (example: therapies used to treat cancer and rheumatological diseases). However, even if you do not have one on these, it can still happen. If you have any of these conditions, or take one of these drugs, make sure to notify your treating physician. Nerve Damage: This is more common when the treatment is an invasive one, but it can also happen with the use of medications, such as those used in the treatment of cancer. The damage can occur to small secondary nerves, or to large primary ones, such as those in the spinal cord and brain. This damage may be temporary or permanent and it may lead to impairments that can range from temporary numbness to permanent paralysis and/or brain death. Allergic Reactions: Any time a substance or material comes in contact with our body, there is the possibility of an allergic reaction. These can range from a mild skin rash (contact dermatitis) to a severe systemic reaction (anaphylactic reaction), which can result in death. Death: In general, any medical intervention can result in death, most of the time due to an unforeseen complication. ____________________________________________________________________________________________  

## 2021-04-28 NOTE — Progress Notes (Signed)
Safety precautions to be maintained throughout the outpatient stay will include: orient to surroundings, keep bed in low position, maintain call bell within reach at all times, provide assistance with transfer out of bed and ambulation.  

## 2021-05-13 ENCOUNTER — Ambulatory Visit (INDEPENDENT_AMBULATORY_CARE_PROVIDER_SITE_OTHER): Payer: Medicare HMO | Admitting: Psychiatry

## 2021-05-13 ENCOUNTER — Telehealth: Payer: Self-pay | Admitting: Family Medicine

## 2021-05-13 ENCOUNTER — Encounter: Payer: Self-pay | Admitting: Psychiatry

## 2021-05-13 DIAGNOSIS — F411 Generalized anxiety disorder: Secondary | ICD-10-CM | POA: Diagnosis not present

## 2021-05-13 DIAGNOSIS — G894 Chronic pain syndrome: Secondary | ICD-10-CM

## 2021-05-13 DIAGNOSIS — R69 Illness, unspecified: Secondary | ICD-10-CM | POA: Diagnosis not present

## 2021-05-13 DIAGNOSIS — F331 Major depressive disorder, recurrent, moderate: Secondary | ICD-10-CM

## 2021-05-13 MED ORDER — CLONIDINE HCL 0.1 MG PO TABS: ORAL_TABLET | ORAL | 0 refills | Status: DC

## 2021-05-13 MED ORDER — L-METHYLFOLATE 15 MG PO TABS: 15.0000 mg | ORAL_TABLET | Freq: Every day | ORAL | 3 refills | Status: DC

## 2021-05-13 MED ORDER — GABAPENTIN 100 MG PO CAPS: ORAL_CAPSULE | ORAL | 1 refills | Status: DC

## 2021-05-13 NOTE — Progress Notes (Signed)
Joseph Arroyo ?[id number] ?Oct 01, 1940 ?81 y.o. ? ?Video Visit via My Chart ? ?I connected with pt by My Chart and verified that I am speaking with the correct person using two identifiers. ?  ?I discussed the limitations, risks, security and privacy concerns of performing an evaluation and management service by My Chart  and the availability of in person appointments. I also discussed with the patient that there may be a patient responsible charge related to this service. The patient expressed understanding and agreed to proceed. ? ?I discussed the assessment and treatment plan with the patient. The patient was provided an opportunity to ask questions and all were answered. The patient agreed with the plan and demonstrated an understanding of the instructions. ?  ?The patient was advised to call back or seek an in-person evaluation if the symptoms worsen or if the condition fails to improve as anticipated. ? ?I provided 30 minutes of video time during this encounter.  The patient was located at home and the provider was located office. ?Session from [id number] ? ?Subjective:  ? ?Patient ID:  Joseph Arroyo is a 81 y.o. (DOB October 09, 1940) male. ? ?Chief Complaint:  ?Chief Complaint  ?Patient presents with  ? Follow-up  ? Anxiety  ? Depression  ? ? ?Depression ?       Associated symptoms include no fatigue.  Past medical history includes anxiety.   ?Anxiety ?Patient reports no dizziness or palpitations.  ? ? ?Joseph Arroyo presents to the office today for follow-up of major depression and generalized anxiety disorder.   ? ?He was seen December, 2020 & June 2021.  No meds were changed.  Continued paroxetine 60 mg daily plus gabapentin 100 mg twice daily and Mirapex 0.125 mg 3 times daily.  ? ?08/2019 appt without med changes noted: ?Tolerates the meds well.  Wife thinks he's doing very well and he agrees.   No complaints.  ?2 D's.   ?Still exercising and has fatigue but can function and enjoys  activity. 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  He feels positive and hopeful and useful.  At some point would like to be free of medication.  Does not feel guilty about the meds.   ? ?03/19/2020 appt noted:  Seen with wife Joseph Arroyo ?Covid August and took Ivermectin and both had it mild. ?As far mood concerned he's doing well.  Wife agrees.  Not markedly depressed or anxious.  Feels the suffering of others.  Joseph Arroyo in hospital in [place].  Friend with Covid also. ?No episodes. ?Tolerating meds.  No Xanax used.  ?Attend [employer] in [place]. ?Usually sleep well but back pain can interfere with sleep. ?[place] for PD and a bit slower per wife. ?Plan: Try increasing gabapentin to 100 mg AM & 200 mg PM for back pain.   ?Continue paroxetine 60 mg daily and Deplin 15 mg daily ? ?10/03/2020 appointment with the following noted: Seen with wife, Joseph Arroyo ?Off and on increased gabapentin at PM to 200 mg for back pain and sleeps better.   ?Not depressed.  Anxiety is manageable.   ?Seeing chiropracter who he likes [name] in [place]. ?No SE noted with meds.  Perspire heavily. ?No panic attacks.   ?Sleep variable and is usually OK. 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 ?Uses [employer] ?Plan: No med change ? ?03/25/2021 appointment with the following noted: with wife Joseph Arroyo ?Embarrassing but came on quickly. ?Shaking with anxiety and been going on for 3-4 weeks. ?Rare use of Xanax. ?A lot  of physical px with constant pain with back. Pending workup.  Fell 3 weeks ago. T pain clinic now.  ?Everything seemed to pile up. ?Insomnia with mind racing with worry.  He can't tolerate negativity.   ?Rare panic. ?Consistent with paroxetine 60 mg daily. ?Consistent with gabapentin 100 AM and 200 PM ?Plan: For persistent symptoms of anxiety we will start off label trial of clonidine.  Cautioned about side effects. ? ?05/13/2021 appointment with the following noted: wife Joseph Arroyo on call ?Taking clonidine 0.1 mg tablet 1/2 tablets twice daily. Can't tell if  there has been benefit from this.   ?Still taking Xanax but usually about one daily. ?Couldn't get here DT pain.  Been to pain clinic but no meds yet.  Problems with insurance company giving approval for the tests.  ?At times pain is excruciating.   ?BP has been OK.   ?Being cautious about getting up and protecting from dizziness. ?Tremors will wax and wane and worse with stress.   ?Fighting over the idea God does not want him to be like this and feels there is something wrong with him.   ?No SI  "that's not an option".   ? ?Was 12 years free of medication until this last relapse and yet it took a long time to get relief again.  Wife, Joseph Arroyo, said overall he's been doing well.  No prn BZ in a couple of years. ? ?Past Psychiatric Medication Trials: Under our care since 2013. ?Paroxetine 60, duloxetine, fluoxetine, Luvox, venlafaxine, Trintellix, , mirtazapine, ?Gabapentin 300 ?Deplin, clonazepam, lorazepam  ?lamotrigine,  buspirone,   ?clonidine ?Abilify, lithium with some benefit, Seroquel, Zyprexa,  risperidone, Saphris,  ?doxepin,  ?pramipexole ? ?Review of Systems:  ?Review of Systems  ?Constitutional:  Negative for fatigue.  ?Cardiovascular:  Negative for palpitations.  ?Musculoskeletal:  Positive for back pain and gait problem.  ?Neurological:  Positive for tremors and weakness. Negative for dizziness.  ? ?Medications: I have reviewed the patient's current medications. ? ?Current Outpatient Medications  ?Medication Sig Dispense Refill  ? ALPRAZolam (XANAX) 0.25 MG tablet Take 1 tablet (0.25 mg total) by mouth 3 (three) times daily as needed for anxiety. 90 tablet 1  ? B Complex Vitamins (VITAMIN B COMPLEX PO) Take 1 tablet by mouth daily.    ? baclofen (LIORESAL) 10 MG tablet TAKE 1/2-1 TABLET BY MOUTH TWICE A DAY AS NEEDED FOR MUSCLE SPASMS 180 tablet 1  ? Capsicum-Garlic 235-573 MG CAPS Take 200-300 mg by mouth.    ? carbidopa-levodopa (SINEMET IR) 25-100 MG tablet Take 2 tablets by mouth 3 (three) times daily.     ? clotrimazole-betamethasone (LOTRISONE) cream Apply 1-2 times a day for worsening flare dry skin dermatitis of toes/feet, may re-use daily up to 1 week as needed. 30 g 1  ? co-enzyme Q-10 30 MG capsule Take 30 mg by mouth daily.    ? HAWTHORNE BERRY PO Take by mouth daily.    ? ibuprofen (ADVIL,MOTRIN) 200 MG tablet Take 200 mg by mouth every 6 (six) hours as needed for moderate pain.    ? Lutein 10 MG TABS Take 1 tablet by mouth 3 (three) times daily. 1 daily    ? Magnesium 200 MG TABS Take 200 mg by mouth in the morning and at bedtime.    ? omega-3 acid ethyl esters (LOVAZA) 1 g capsule Take 1 capsule by mouth daily.    ? PARoxetine (PAXIL) 20 MG tablet TAKE 1 TABLET BY MOUTH EVERY DAY (WITH THE 40 MG  TABLET FOR 60 MG TOTAL) 90 tablet 1  ? PARoxetine (PAXIL) 40 MG tablet TAKE 1 TABLET BY MOUTH EVERY DAY (WITH THE 20 MG TABLET FOR 60 MG TOTAL) 90 tablet 1  ? pramipexole (MIRAPEX) 0.125 MG tablet Take 0.125 mg by mouth at bedtime. 1 tab at bedtime    ? PSYLLIUM HUSK PO Take by mouth.    ? Saw Palmetto, Serenoa repens, 1000 MG CAPS Take 2 capsules by mouth daily.    ? cloNIDine (CATAPRES) 0.1 MG tablet 1/2 tablet twice daily 90 tablet 0  ? gabapentin (NEURONTIN) 100 MG capsule 2 CAPSULES twice daily 270 capsule 1  ? L-Methylfolate 15 MG TABS Take 1 tablet (15 mg total) by mouth daily. 90 tablet 3  ? ?No current facility-administered medications for this visit.  ? ? ?Medication Side Effects: Fatigue and Other: from Mirapex  esp in AM ? ?Allergies:  ?Allergies  ?Allergen Reactions  ? Prednisone   ?  Hyperactivity ?  ? Wheat Bran Diarrhea  ? ? ?Past Medical History:  ?Diagnosis Date  ? Allergic rhinitis due to pollen 11/21/2007  ? Brachial neuritis or radiculitis NOS   ? Cervicalgia   ? Concussion   ? age 58 - s/p accident  ? Costal chondritis   ? Depression   ? Essential and other specified forms of tremor   ? GERD (gastroesophageal reflux disease)   ? in past  ? Lumbago   ? Occlusion and stenosis of carotid artery  without mention of cerebral infarction   ? Seizures ([place])   ? age 52 - after concussion  ? ? ?Family History  ?Problem Relation Age of Onset  ? Heart failure Mother   ?     enlarged heart   ? Asthma Father   ? ? ?S

## 2021-05-13 NOTE — Telephone Encounter (Signed)
Left message for patient to call back and schedule the Medicare Annual Wellness Visit (AWV) virtually or by telephone. ? ?Last AWV 09/19/19 ? ?Please schedule at anytime with [facility]. ? ?40 minute appointment ? ?Any questions, please call me at [phone]  ?

## 2021-05-15 ENCOUNTER — Ambulatory Visit (INDEPENDENT_AMBULATORY_CARE_PROVIDER_SITE_OTHER): Payer: Medicare PPO | Admitting: Vascular Surgery

## 2021-05-15 ENCOUNTER — Encounter (INDEPENDENT_AMBULATORY_CARE_PROVIDER_SITE_OTHER): Payer: Medicare PPO

## 2021-05-16 DIAGNOSIS — M5451 Vertebrogenic low back pain: Secondary | ICD-10-CM | POA: Diagnosis not present

## 2021-05-16 DIAGNOSIS — R262 Difficulty in walking, not elsewhere classified: Secondary | ICD-10-CM | POA: Diagnosis not present

## 2021-05-16 DIAGNOSIS — M5459 Other low back pain: Secondary | ICD-10-CM | POA: Diagnosis not present

## 2021-05-19 DIAGNOSIS — R262 Difficulty in walking, not elsewhere classified: Secondary | ICD-10-CM | POA: Diagnosis not present

## 2021-05-19 DIAGNOSIS — M5451 Vertebrogenic low back pain: Secondary | ICD-10-CM | POA: Diagnosis not present

## 2021-05-19 DIAGNOSIS — M5459 Other low back pain: Secondary | ICD-10-CM | POA: Diagnosis not present

## 2021-05-23 DIAGNOSIS — M5459 Other low back pain: Secondary | ICD-10-CM | POA: Diagnosis not present

## 2021-05-23 DIAGNOSIS — M5451 Vertebrogenic low back pain: Secondary | ICD-10-CM | POA: Diagnosis not present

## 2021-05-23 DIAGNOSIS — R262 Difficulty in walking, not elsewhere classified: Secondary | ICD-10-CM | POA: Diagnosis not present

## 2021-05-27 DIAGNOSIS — R262 Difficulty in walking, not elsewhere classified: Secondary | ICD-10-CM | POA: Diagnosis not present

## 2021-05-27 DIAGNOSIS — M5459 Other low back pain: Secondary | ICD-10-CM | POA: Diagnosis not present

## 2021-05-27 DIAGNOSIS — M5451 Vertebrogenic low back pain: Secondary | ICD-10-CM | POA: Diagnosis not present

## 2021-05-29 DIAGNOSIS — R262 Difficulty in walking, not elsewhere classified: Secondary | ICD-10-CM | POA: Diagnosis not present

## 2021-05-29 DIAGNOSIS — M5459 Other low back pain: Secondary | ICD-10-CM | POA: Diagnosis not present

## 2021-05-29 DIAGNOSIS — M5451 Vertebrogenic low back pain: Secondary | ICD-10-CM | POA: Diagnosis not present

## 2021-05-30 DIAGNOSIS — M5459 Other low back pain: Secondary | ICD-10-CM | POA: Diagnosis not present

## 2021-05-30 DIAGNOSIS — M5451 Vertebrogenic low back pain: Secondary | ICD-10-CM | POA: Diagnosis not present

## 2021-05-30 DIAGNOSIS — R262 Difficulty in walking, not elsewhere classified: Secondary | ICD-10-CM | POA: Diagnosis not present

## 2021-06-02 ENCOUNTER — Telehealth: Payer: Self-pay

## 2021-06-02 ENCOUNTER — Other Ambulatory Visit: Payer: Self-pay | Admitting: Pain Medicine

## 2021-06-02 DIAGNOSIS — M5417 Radiculopathy, lumbosacral region: Secondary | ICD-10-CM

## 2021-06-02 DIAGNOSIS — M5459 Other low back pain: Secondary | ICD-10-CM | POA: Diagnosis not present

## 2021-06-02 DIAGNOSIS — M5451 Vertebrogenic low back pain: Secondary | ICD-10-CM | POA: Diagnosis not present

## 2021-06-02 DIAGNOSIS — M545 Low back pain, unspecified: Secondary | ICD-10-CM

## 2021-06-02 DIAGNOSIS — G8929 Other chronic pain: Secondary | ICD-10-CM

## 2021-06-02 DIAGNOSIS — M4310 Spondylolisthesis, site unspecified: Secondary | ICD-10-CM

## 2021-06-02 DIAGNOSIS — M5137 Other intervertebral disc degeneration, lumbosacral region: Secondary | ICD-10-CM

## 2021-06-02 DIAGNOSIS — R262 Difficulty in walking, not elsewhere classified: Secondary | ICD-10-CM | POA: Diagnosis not present

## 2021-06-02 NOTE — Telephone Encounter (Signed)
Spoke with wife and patient and answered the questions that they had about his medications and the procedure.  Instructed them to call back for any further questions or concerns.  ?

## 2021-06-02 NOTE — Progress Notes (Signed)
For some unknown reason the insurance company has denied the diagnostic lumbar facet blocks on this patient.  Today will be entering an order for an alternative treatment. ?

## 2021-06-02 NOTE — Telephone Encounter (Signed)
I scheduled him for an lesi in the morning at 8 and she has a lot of questions about medicines. Please call her before 1pm  ?

## 2021-06-02 NOTE — Progress Notes (Signed)
PROVIDER NOTE: Interpretation of information contained herein should be left to medically-trained personnel. Specific patient instructions are provided elsewhere under "Patient Instructions" section of medical record. This document was created in part using STT-dictation technology, any transcriptional errors that may result from this process are unintentional.  ?Patient: Joseph Arroyo ?Type: Established ?DOB: 1940-04-25 ?MRN: [id number] ?PCP: Joseph Hauser, DO  Service: Procedure ?DOS: 06/03/2021 ?Setting: Ambulatory ?Location: Ambulatory outpatient facility ?Delivery: Face-to-face Provider: Gaspar Cola, MD ?Specialty: Interventional Pain Management ?Specialty designation: 09 ?Location: Outpatient facility ?Ref. Prov.: Joseph Arroyo *   ? ?Primary Reason for Visit: Interventional Pain Management Treatment. ?CC: Back Pain (lower) ?  ?Procedure:          ? Type: Lumbar epidural steroid injection (LESI) (interlaminar) #1    ?Laterality: Right   ?Level:  L2-3 Level.  ?Imaging: Fluoroscopic guidance ?Anesthesia: Local anesthesia (1-2% Lidocaine) ?Anxiolysis: IV Versed 1.5 mg to help with the Parkinson tremors. ?Sedation: None. ?DOS: 06/03/2021  ?Performed by: Joseph Cola, MD ? ?Purpose: Diagnostic/Therapeutic ?Indications: Lumbar radicular pain of intraspinal etiology of more than 4 weeks that has failed to respond to conservative therapy and is severe enough to impact quality of life or function. ?1. Lumbosacral radiculitis/sensory radiculopathy at L2 (Bilateral)   ?2. Lumbosacral radiculitis/sensory radiculopathy at L3 (Bilateral)   ?3. DDD (degenerative disc disease), lumbosacral   ?4. Lumbar central spinal stenosis, w/o neurogenic claudication (L4-5)   ?5. Lumbar lateral recess stenosis (Bilateral: L2-3, L4-5) (Right: L3-4)   ?6. Lumbosacral foraminal stenosis (Bilateral: L2-3, L3-4) (Left: L5-S1)   ?7. Lumbar nerve root impingement (Right: L3 at L2-3 & L3-4)   ?8. Ligamentum  flavum hypertrophy (L3-4, L4-5)   ?9. Chronic lower extremity pain (2ry area of Pain) (Bilateral) (L>R)   ?10. Chronic low back pain (1ry area of Pain) (Bilateral) (R>L) w/o sciatica   ?11. Lumbosacral facet hypertrophy   ?12. Anxiety due to invasive procedure   ? ?NAS-11 Pain score:  ? Pre-procedure: 10-Worst pain ever/10  ? Post-procedure: 0-No pain/10  ? ?The patient's insurance company has denied my initial request of a diagnostic bilateral lumbar facet block despite the fact that history and physical exam would suggest this to be the primary cause of the pain.  Based on the patient's history of the low back pain is more intense than the lower extremity pain.  Physical exam with provocative hyperextension and rotation maneuvers and Kemp maneuver were both positive for bilateral lumbar facet arthralgia with exact reproduction of the patient's pain based on the patient's feedback from the maneuver.  MRI was positive for two-level retrolisthesis with multilevel bilateral facet hypertrophy/arthropathy.  Patient has already had physical therapy x2 with documented evidence that it did not help and in fact made things worse.  And yet, despite all history, clinical exam, and imaging pointing at Lumbar facet disease as the primary cause of this patient's problem, they have chosen to deny treatment, allowing only for lumbar epidural steroid injection as an alternative. ?  ? ?Position / Prep / Materials:  ?Position: Prone w/ head of the table raised (slight reverse trendelenburg) to facilitate breathing.  ?Prep solution: DuraPrep (Iodine Povacrylex [0.7% available iodine] and Isopropyl Alcohol, 74% w/w) ?Prep Area: Entire Posterior Lumbar Region from lower scapular tip down to mid buttocks area and from flank to flank. ?Materials:  ?Tray: Epidural tray ?Needle(s):  ?Type: Epidural needle          ?Gauge (G):  17 ?Length: Regular (3.5-in) ?Qty: 1 ? ?Pre-op H&P Assessment:  ?  Joseph Arroyo is a 81 y.o. (year old), male  patient, seen today for interventional treatment. He  has a past surgical history that includes Other surgical history (2002); Colonoscopy (2008); Stapedes surgery (Right, 2002); Cataract extraction (Right, 07/18/14); and Cataract extraction w/PHACO (Left, 08/01/2014). Joseph Arroyo has a current medication list which includes the following prescription(s): alprazolam, b complex vitamins, baclofen, capsicum-garlic, carbidopa-levodopa, clonidine, clotrimazole-betamethasone, co-enzyme q-10, gabapentin, hawthorn, ibuprofen, l-methylfolate, lutein, magnesium, omega-3 acid ethyl esters, paroxetine, paroxetine, pramipexole, psyllium, and saw palmetto (serenoa repens), and the following Facility-Administered Medications: pentafluoroprop-tetrafluoroeth. His primarily concern today is the Back Pain (lower) ? ?Initial Vital Signs:  ?Pulse/HCG Rate: 79ECG Heart Rate: (!) 56 ?Temp: (!) 97.3 ?F (36.3 ?C) ?Resp: 16 ?BP: 134/81 ?SpO2: 97 % ? ?BMI: Estimated body mass index is 20.18 kg/m? as calculated from the following: ?  Height as of this encounter: '5\' 6"'$  (1.676 m). ?  Weight as of this encounter: 125 lb (56.7 kg). ? ?Risk Assessment: ?Allergies: Reviewed. He is allergic to prednisone and wheat bran.  ?Allergy Precautions: None required ?Coagulopathies: Reviewed. None identified.  ?Blood-thinner therapy: None at this time ?Active Infection(s): Reviewed. None identified. Joseph Arroyo is afebrile ? ?Site Confirmation: Joseph Arroyo was asked to confirm the procedure and laterality before marking the site ?Procedure checklist: Completed ?Consent: Before the procedure and under the influence of no sedative(s), amnesic(s), or anxiolytics, the patient was informed of the treatment options, risks and possible complications. To fulfill our ethical and legal obligations, as recommended by the American Medical Association's Code of Ethics, I have informed the patient of my clinical impression; the nature and purpose of the treatment or  procedure; the risks, benefits, and possible complications of the intervention; the alternatives, including doing nothing; the risk(s) and benefit(s) of the alternative treatment(s) or procedure(s); and the risk(s) and benefit(s) of doing nothing. ?The patient was provided information about the general risks and possible complications associated with the procedure. These may include, but are not limited to: failure to achieve desired goals, infection, bleeding, organ or nerve damage, allergic reactions, paralysis, and death. ?In addition, the patient was informed of those risks and complications associated to Spine-related procedures, such as failure to decrease pain; infection (i.e.: Meningitis, epidural or intraspinal abscess); bleeding (i.e.: epidural hematoma, subarachnoid hemorrhage, or any other type of intraspinal or peri-dural bleeding); organ or nerve damage (i.e.: Any type of peripheral nerve, nerve root, or spinal cord injury) with subsequent damage to sensory, motor, and/or autonomic systems, resulting in permanent pain, numbness, and/or weakness of one or several areas of the body; allergic reactions; (i.e.: anaphylactic reaction); and/or death. ?Furthermore, the patient was informed of those risks and complications associated with the medications. These include, but are not limited to: allergic reactions (i.e.: anaphylactic or anaphylactoid reaction(s)); adrenal axis suppression; blood sugar elevation that in diabetics may result in ketoacidosis or comma; water retention that in patients with history of congestive heart failure may result in shortness of breath, pulmonary edema, and decompensation with resultant heart failure; weight gain; swelling or edema; medication-induced neural toxicity; particulate matter embolism and blood vessel occlusion with resultant organ, and/or nervous system infarction; and/or aseptic necrosis of one or more joints. ?Finally, the patient was informed that Medicine is  not an exact science; therefore, there is also the possibility of unforeseen or unpredictable risks and/or possible complications that may result in a catastrophic outcome. The patient indicated having understood

## 2021-06-03 ENCOUNTER — Ambulatory Visit (HOSPITAL_BASED_OUTPATIENT_CLINIC_OR_DEPARTMENT_OTHER): Payer: Medicare HMO | Admitting: Pain Medicine

## 2021-06-03 ENCOUNTER — Ambulatory Visit
Admission: RE | Admit: 2021-06-03 | Discharge: 2021-06-03 | Disposition: A | Discharge status: Home / Self Care | Payer: Medicare HMO | Source: Ambulatory Visit | Attending: Pain Medicine | Admitting: Pain Medicine

## 2021-06-03 ENCOUNTER — Encounter: Payer: Self-pay | Admitting: Pain Medicine

## 2021-06-03 VITALS — BP 110/68 | HR 60 | Temp 97.3°F | Resp 16 | Ht 66.0 in | Wt 125.0 lb

## 2021-06-03 DIAGNOSIS — M79605 Pain in left leg: Secondary | ICD-10-CM | POA: Diagnosis not present

## 2021-06-03 DIAGNOSIS — M4807 Spinal stenosis, lumbosacral region: Secondary | ICD-10-CM | POA: Insufficient documentation

## 2021-06-03 DIAGNOSIS — G8929 Other chronic pain: Secondary | ICD-10-CM | POA: Diagnosis not present

## 2021-06-03 DIAGNOSIS — M47817 Spondylosis without myelopathy or radiculopathy, lumbosacral region: Secondary | ICD-10-CM | POA: Insufficient documentation

## 2021-06-03 DIAGNOSIS — M79604 Pain in right leg: Secondary | ICD-10-CM | POA: Diagnosis not present

## 2021-06-03 DIAGNOSIS — M2428 Disorder of ligament, vertebrae: Secondary | ICD-10-CM | POA: Insufficient documentation

## 2021-06-03 DIAGNOSIS — M48061 Spinal stenosis, lumbar region without neurogenic claudication: Secondary | ICD-10-CM | POA: Insufficient documentation

## 2021-06-03 DIAGNOSIS — M5137 Other intervertebral disc degeneration, lumbosacral region: Secondary | ICD-10-CM | POA: Diagnosis not present

## 2021-06-03 DIAGNOSIS — M5417 Radiculopathy, lumbosacral region: Secondary | ICD-10-CM | POA: Diagnosis not present

## 2021-06-03 DIAGNOSIS — F419 Anxiety disorder, unspecified: Secondary | ICD-10-CM

## 2021-06-03 DIAGNOSIS — G20 Parkinson's disease: Secondary | ICD-10-CM | POA: Insufficient documentation

## 2021-06-03 DIAGNOSIS — M5416 Radiculopathy, lumbar region: Secondary | ICD-10-CM | POA: Insufficient documentation

## 2021-06-03 DIAGNOSIS — R69 Illness, unspecified: Secondary | ICD-10-CM | POA: Diagnosis not present

## 2021-06-03 DIAGNOSIS — M545 Low back pain, unspecified: Secondary | ICD-10-CM | POA: Diagnosis not present

## 2021-06-03 MED ORDER — SODIUM CHLORIDE (PF) 0.9 % IJ SOLN
INTRAMUSCULAR | Status: AC
  Filled 2021-06-03: qty 10

## 2021-06-03 MED ORDER — LACTATED RINGERS IV SOLN
1000.0000 mL | Freq: Once | INTRAVENOUS | Status: AC
  Administered 2021-06-03: 1000 mL via INTRAVENOUS

## 2021-06-03 MED ORDER — PENTAFLUOROPROP-TETRAFLUOROETH EX AERO: INHALATION_SPRAY | Freq: Once | CUTANEOUS | Status: DC

## 2021-06-03 MED ORDER — IOHEXOL 180 MG/ML  SOLN
10.0000 mL | Freq: Once | INTRAMUSCULAR | Status: AC
  Administered 2021-06-03: 5 mL via EPIDURAL

## 2021-06-03 MED ORDER — LIDOCAINE HCL 2 % IJ SOLN
20.0000 mL | Freq: Once | INTRAMUSCULAR | Status: AC
  Administered 2021-06-03: 400 mg
  Filled 2021-06-03: qty 20

## 2021-06-03 MED ORDER — ROPIVACAINE HCL 2 MG/ML IJ SOLN
2.0000 mL | Freq: Once | INTRAMUSCULAR | Status: AC
  Administered 2021-06-03: 2 mL via EPIDURAL
  Filled 2021-06-03: qty 20

## 2021-06-03 MED ORDER — MIDAZOLAM HCL 5 MG/5ML IJ SOLN
0.5000 mg | Freq: Once | INTRAMUSCULAR | Status: AC
  Administered 2021-06-03: 1.5 mg via INTRAVENOUS
  Filled 2021-06-03: qty 5

## 2021-06-03 MED ORDER — SODIUM CHLORIDE 0.9% FLUSH
2.0000 mL | Freq: Once | INTRAVENOUS | Status: AC
  Administered 2021-06-03: 2 mL

## 2021-06-03 MED ORDER — TRIAMCINOLONE ACETONIDE 40 MG/ML IJ SUSP
40.0000 mg | Freq: Once | INTRAMUSCULAR | Status: AC
  Administered 2021-06-03: 40 mg
  Filled 2021-06-03: qty 1

## 2021-06-03 NOTE — Patient Instructions (Signed)

## 2021-06-04 ENCOUNTER — Telehealth: Payer: Self-pay | Admitting: *Deleted

## 2021-06-04 NOTE — Telephone Encounter (Signed)
No problems post procedure. 

## 2021-06-09 ENCOUNTER — Encounter (INDEPENDENT_AMBULATORY_CARE_PROVIDER_SITE_OTHER): Payer: Self-pay

## 2021-06-09 ENCOUNTER — Ambulatory Visit (INDEPENDENT_AMBULATORY_CARE_PROVIDER_SITE_OTHER): Payer: Self-pay | Admitting: Vascular Surgery

## 2021-06-09 DIAGNOSIS — R262 Difficulty in walking, not elsewhere classified: Secondary | ICD-10-CM | POA: Diagnosis not present

## 2021-06-09 DIAGNOSIS — M5451 Vertebrogenic low back pain: Secondary | ICD-10-CM | POA: Diagnosis not present

## 2021-06-09 DIAGNOSIS — M5459 Other low back pain: Secondary | ICD-10-CM | POA: Diagnosis not present

## 2021-06-12 DIAGNOSIS — R262 Difficulty in walking, not elsewhere classified: Secondary | ICD-10-CM | POA: Diagnosis not present

## 2021-06-12 DIAGNOSIS — M5459 Other low back pain: Secondary | ICD-10-CM | POA: Diagnosis not present

## 2021-06-12 DIAGNOSIS — M5451 Vertebrogenic low back pain: Secondary | ICD-10-CM | POA: Diagnosis not present

## 2021-06-16 DIAGNOSIS — M5459 Other low back pain: Secondary | ICD-10-CM | POA: Diagnosis not present

## 2021-06-16 DIAGNOSIS — M5451 Vertebrogenic low back pain: Secondary | ICD-10-CM | POA: Diagnosis not present

## 2021-06-16 DIAGNOSIS — R262 Difficulty in walking, not elsewhere classified: Secondary | ICD-10-CM | POA: Diagnosis not present

## 2021-06-18 DIAGNOSIS — M5451 Vertebrogenic low back pain: Secondary | ICD-10-CM | POA: Diagnosis not present

## 2021-06-18 DIAGNOSIS — R262 Difficulty in walking, not elsewhere classified: Secondary | ICD-10-CM | POA: Diagnosis not present

## 2021-06-18 DIAGNOSIS — M5459 Other low back pain: Secondary | ICD-10-CM | POA: Diagnosis not present

## 2021-06-22 NOTE — Progress Notes (Signed)
PROVIDER NOTE: Information contained herein reflects review and annotations entered in association with encounter. Interpretation of such information and data should be left to medically-trained personnel. Information provided to patient can be located elsewhere in the medical record under "Patient Instructions". Document created using STT-dictation technology, any transcriptional errors that may result from process are unintentional.  ?  ?Patient: Joseph Arroyo  Service Category: E/M  Provider: Gaspar Cola, MD  ?DOB: June 24, 1940  DOS: 06/24/2021  Specialty: Interventional Pain Management  ?MRN: [id number]  Setting: Ambulatory outpatient  PCP: Olin Hauser, DO  ?Type: Established Patient    Referring Provider: Nobie Putnam *  ?Location: Office  Delivery: Face-to-face    ? ?HPI  ?Joseph Arroyo, a 81 y.o. year old male, is here today because of his Chronic bilateral low back pain without sciatica [M54.50, G89.29]. Joseph Arroyo's primary complain today is Back Pain (low) ?Last encounter: My last encounter with him was on 06/03/2021. ?Pertinent problems: Joseph Arroyo has Parkinson's disease ([place]); Chronic knee pain (Left); Derangement of medial meniscus, posterior horn (Left); PAD (peripheral artery disease) ([place]); Chronic pain syndrome; Grade 1 Retrolisthesis of L2/L3 (5 mm) and L3/L4 (3 mm); Levoscoliosis of lumbar spine (L3-4 apex); DDD (degenerative disc disease), lumbosacral; Lumbosacral facet arthropathy (Left: L3-4, L4-5, and L5-S1); Tricompartment osteoarthritis of knee (Left); Baker cyst (Left); Chronic low back pain (1ry area of Pain) (Bilateral) (R>L) w/o sciatica; Lumbar facet syndrome (Bilateral); Chronic lower extremity pain (2ry area of Pain) (Bilateral) (L>R); Lumbosacral radiculitis/sensory radiculopathy at L2 (Bilateral); Lumbosacral radiculitis/sensory radiculopathy at L3 (Bilateral); Abnormal MRI, lumbar spine (04/23/2021); Lumbosacral facet hypertrophy;  Lumbar central spinal stenosis, w/o neurogenic claudication (L4-5); Lumbar lateral recess stenosis (Bilateral: L2-3, L4-5) (Right: L3-4); Lumbosacral foraminal stenosis (Bilateral: L2-3, L3-4) (Left: L5-S1); Lumbar nerve root impingement (Right: L3 at L2-3 & L3-4); Ligamentum flavum hypertrophy (L3-4, L4-5); and Spondylosis without myelopathy or radiculopathy, lumbosacral region on their pertinent problem list. ?Pain Assessment: Severity of Chronic pain is reported as a 3 /10. Location: Back Lower/denies. Onset: More than a month ago. Quality: Stabbing, Burning. Timing: Constant. Modifying factor(s): lying flat on his back, rest, heat, TENS. ?Vitals:  height is '5\' 8"'$  (1.727 m) and weight is 128 lb 14.4 oz (58.5 kg). His temperature is 97.3 ?F (36.3 ?C) (abnormal). His blood pressure is 120/107 (abnormal) and his pulse is 59 (abnormal). His respiration is 18 and oxygen saturation is 99%.  ? ?Reason for encounter: post-procedure evaluation and assessment.  During my initial evaluation, my impression was that this patient needed a diagnostic bilateral lumbar facet block for his low back pain as he presented with a history of scoliosis, MRI evidence of multilevel facet hypertrophy, and minimal lower extremity complaints.  However, in the infinite worse than of the insurance company, they denied the facet blocks [place] that we were forced to attempt treating his facet problems with a lumbar epidural steroid injection.  Although the patient had some improvement of the pain (45%) it was nowhere near what I was expecting for him to get if we had done the lumbar facet blocks.  In any case, the patient's wife appealed their decision and they have approved him.  At this point, we will schedule him to return for diagnostic bilateral lumbar facet blocks.  The plan was shared with the patient who understood and accepted.  Should the patient get adequate relief with the facet blocks, we will consider radiofrequency  ablation. ? ?Post-procedure evaluation  ? Type: Lumbar epidural steroid injection (LESI) (interlaminar) #1    ?  Laterality: Right   ?Level:  L2-3 Level.  ?Imaging: Fluoroscopic guidance ?Anesthesia: Local anesthesia (1-2% Lidocaine) ?Anxiolysis: IV Versed 1.5 mg to help with the Parkinson tremors. ?Sedation: None. ?DOS: 06/03/2021  ?Performed by: Gaspar Cola, MD ? ?Purpose: Diagnostic/Therapeutic ?Indications: Lumbar radicular pain of intraspinal etiology of more than 4 weeks that has failed to respond to conservative therapy and is severe enough to impact quality of life or function. ?1. Lumbosacral radiculitis/sensory radiculopathy at L2 (Bilateral)   ?2. Lumbosacral radiculitis/sensory radiculopathy at L3 (Bilateral)   ?3. DDD (degenerative disc disease), lumbosacral   ?4. Lumbar central spinal stenosis, w/o neurogenic claudication (L4-5)   ?5. Lumbar lateral recess stenosis (Bilateral: L2-3, L4-5) (Right: L3-4)   ?6. Lumbosacral foraminal stenosis (Bilateral: L2-3, L3-4) (Left: L5-S1)   ?7. Lumbar nerve root impingement (Right: L3 at L2-3 & L3-4)   ?8. Ligamentum flavum hypertrophy (L3-4, L4-5)   ?9. Chronic lower extremity pain (2ry area of Pain) (Bilateral) (L>R)   ?10. Chronic low back pain (1ry area of Pain) (Bilateral) (R>L) w/o sciatica   ?11. Lumbosacral facet hypertrophy   ?12. Anxiety due to invasive procedure   ? ?NAS-11 Pain score:  ? Pre-procedure: 10-Worst pain ever/10  ? Post-procedure: 0-No pain/10  ? ?The patient's insurance company has denied my initial request of a diagnostic bilateral lumbar facet block despite the fact that history and physical exam would suggest this to be the primary cause of the pain.  Based on the patient's history of the low back pain is more intense than the lower extremity pain.  Physical exam with provocative hyperextension and rotation maneuvers and Kemp maneuver were both positive for bilateral lumbar facet arthralgia with exact reproduction of the patient's  pain based on the patient's feedback from the maneuver.  MRI was positive for two-level retrolisthesis with multilevel bilateral facet hypertrophy/arthropathy.  Patient has already had physical therapy x2 with documented evidence that it did not help and in fact made things worse.  And yet, despite all history, clinical exam, and imaging pointing at Lumbar facet disease as the primary cause of this patient's problem, they have chosen to deny treatment, allowing only for lumbar epidural steroid injection as an alternative. ?   ?Effectiveness:  ?Initial hour after procedure: 100 %. ?Subsequent 4-6 hours post-procedure: 100 %. ?Analgesia past initial 6 hours: 45 %. ?Ongoing improvement:  ?Analgesic: The patient refers having attained an ongoing 45% improvement in his low back pain.  He denies currently having any type of lower extremity pain except for the discomfort that he has due to his Parkinson's. ?Function: Somewhat improved ?ROM: Somewhat improved ? ?Pharmacotherapy Assessment  ?Analgesic: None ?MME/day: 0 mg/day  ? ?Monitoring: ?[place] PMP: PDMP reviewed during this encounter.       ?Pharmacotherapy: No side-effects or adverse reactions reported. ?Compliance: No problems identified. ?Effectiveness: Clinically acceptable. ? ?Hart Rochester RN  06/24/2021  2:34 PM  Signed ?Safety precautions to be maintained throughout the outpatient stay will include: orient to surroundings, keep bed in low position, maintain call bell within reach at all times, provide assistance with transfer out of bed and ambulation.  ?   UDS:  ?Summary  ?Date Value Ref Range Status  ?03/25/2021 Note  Final  ?  Comment:  ?  ==================================================================== ?Compliance Drug Analysis, Ur ?==================================================================== ?Test                             Result  Flag       Units ? ?Drug Present and Declared for Prescription Verification ?  Gabapentin                      PRESENT      EXPECTED ?  Baclofen                       PRESENT      EXPECTED ?  Paroxetine                     PRESENT      EXPECTED ?  Ibuprofen                      PRESENT      EXPECTED ? ?Drug Present not Decla

## 2021-06-23 DIAGNOSIS — M5459 Other low back pain: Secondary | ICD-10-CM | POA: Diagnosis not present

## 2021-06-23 DIAGNOSIS — R262 Difficulty in walking, not elsewhere classified: Secondary | ICD-10-CM | POA: Diagnosis not present

## 2021-06-23 DIAGNOSIS — M5451 Vertebrogenic low back pain: Secondary | ICD-10-CM | POA: Diagnosis not present

## 2021-06-24 ENCOUNTER — Other Ambulatory Visit: Payer: Self-pay

## 2021-06-24 ENCOUNTER — Ambulatory Visit: Payer: Medicare HMO | Attending: Pain Medicine | Admitting: Pain Medicine

## 2021-06-24 ENCOUNTER — Encounter: Payer: Self-pay | Admitting: Pain Medicine

## 2021-06-24 VITALS — BP 120/107 | HR 59 | Temp 97.3°F | Resp 18 | Ht 68.0 in | Wt 128.9 lb

## 2021-06-24 DIAGNOSIS — M47817 Spondylosis without myelopathy or radiculopathy, lumbosacral region: Secondary | ICD-10-CM | POA: Diagnosis not present

## 2021-06-24 DIAGNOSIS — G8929 Other chronic pain: Secondary | ICD-10-CM | POA: Insufficient documentation

## 2021-06-24 DIAGNOSIS — M4310 Spondylolisthesis, site unspecified: Secondary | ICD-10-CM | POA: Diagnosis not present

## 2021-06-24 DIAGNOSIS — M47816 Spondylosis without myelopathy or radiculopathy, lumbar region: Secondary | ICD-10-CM | POA: Insufficient documentation

## 2021-06-24 DIAGNOSIS — R937 Abnormal findings on diagnostic imaging of other parts of musculoskeletal system: Secondary | ICD-10-CM | POA: Diagnosis not present

## 2021-06-24 DIAGNOSIS — M545 Low back pain, unspecified: Secondary | ICD-10-CM | POA: Insufficient documentation

## 2021-06-24 NOTE — Patient Instructions (Signed)
______________________________________________________________________ ? ?Preparing for Procedure with Sedation ? ?NOTICE: Due to recent regulatory changes, starting on September 30, 2020, procedures requiring intravenous (IV) sedation will no longer be performed at the [facility].  These types of procedures are required to be performed at [facility] ambulatory surgery facility.  We are very sorry for the inconvenience. ? ?Procedure appointments are limited to planned procedures: ?No Prescription Refills. ?No disability issues will be discussed. ?No medication changes will be discussed. ? ?Instructions: ?Oral Intake: Do not eat or drink anything for at least 8 hours prior to your procedure. (Exception: Blood Pressure Medication. See below.) ?Transportation: A driver is required. You may not drive yourself after the procedure. ?Blood Pressure Medicine: Do not forget to take your blood pressure medicine with a sip of water the morning of the procedure. If your Diastolic (lower reading) is above 100 mmHg, elective cases will be cancelled/rescheduled. ?Blood thinners: These will need to be stopped for procedures. Notify our staff if you are taking any blood thinners. Depending on which one you take, there will be specific instructions on how and when to stop it. ?Diabetics on insulin: Notify the staff so that you can be scheduled 1st case in the morning. If your diabetes requires high dose insulin, take only ? of your normal insulin dose the morning of the procedure and notify the staff that you have done so. ?Preventing infections: Shower with an antibacterial soap the morning of your procedure. ?Build-up your immune system: Take 1000 mg of Vitamin C with every meal (3 times a day) the day prior to your procedure. ?Antibiotics: Inform the staff if you have a condition or reason that requires you to take antibiotics before dental procedures. ?Pregnancy: If you are pregnant, call and cancel the procedure. ?Sickness: If  you have a cold, fever, or any active infections, call and cancel the procedure. ?Arrival: You must be in the facility at least 30 minutes prior to your scheduled procedure. ?Children: Do not bring children with you. ?Dress appropriately: Bring dark clothing that you would not mind if they get stained. ?Valuables: Do not bring any jewelry or valuables. ? ?Reasons to call and reschedule or cancel your procedure: (Following these recommendations will minimize the risk of a serious complication.) ?Surgeries: Avoid having procedures within 2 weeks of any surgery. (Avoid for 2 weeks before or after any surgery). ?Flu Shots: Avoid having procedures within 2 weeks of a flu shots. (Avoid for 2 weeks before or after immunizations). ?Barium: Avoid having a procedure within 7-10 days after having had a radiological study involving the use of radiological contrast. (Myelograms, Barium swallow or enema study). ?Heart attacks: Avoid any elective procedures or surgeries for the initial 6 months after a "Myocardial Infarction" (Heart Attack). ?Blood thinners: It is imperative that you stop these medications before procedures. Let us know if you if you take any blood thinner.  ?Infection: Avoid procedures during or within two weeks of an infection (including chest colds or gastrointestinal problems). Symptoms associated with infections include: Localized redness, fever, chills, night sweats or profuse sweating, burning sensation when voiding, cough, congestion, stuffiness, runny nose, sore throat, diarrhea, nausea, vomiting, cold or Flu symptoms, recent or current infections. It is specially important if the infection is over the area that we intend to treat. ?Heart and lung problems: Symptoms that may suggest an active cardiopulmonary problem include: cough, chest pain, breathing difficulties or shortness of breath, dizziness, ankle swelling, uncontrolled high or unusually low blood pressure, and/or palpitations. If you are    experiencing any of these symptoms, cancel your procedure and contact your primary care physician for an evaluation. ? ?Remember:  ?Regular Business hours are:  ?Monday to Thursday 8:00 AM to 4:00 PM ? ?Provider's Schedule: ?Djibril Glogowski, MD:  ?Procedure days: Tuesday and Thursday 7:30 AM to 4:00 PM ? ?Bilal Lateef, MD:  ?Procedure days: Monday and Wednesday 7:30 AM to 4:00 PM ?______________________________________________________________________ ? ____________________________________________________________________________________________ ? ?General Risks and Possible Complications ? ?Patient Responsibilities: It is important that you read this as it is part of your informed consent. It is our duty to inform you of the risks and possible complications associated with treatments offered to you. It is your responsibility as a patient to read this and to ask questions about anything that is not clear or that you believe was not covered in this document. ? ?Patient?s Rights: You have the right to refuse treatment. You also have the right to change your mind, even after initially having agreed to have the treatment done. However, under this last option, if you wait until the last second to change your mind, you may be charged for the materials used up to that point. ? ?Introduction: Medicine is not an exact science. Everything in Medicine, including the lack of treatment(s), carries the potential for danger, harm, or loss (which is by definition: Risk). In Medicine, a complication is a secondary problem, condition, or disease that can aggravate an already existing one. All treatments carry the risk of possible complications. The fact that a side effects or complications occurs, does not imply that the treatment was conducted incorrectly. It must be clearly understood that these can happen even when everything is done following the highest safety standards. ? ?No treatment: You can choose not to proceed with the  proposed treatment alternative. The ?PRO(s)? would include: avoiding the risk of complications associated with the therapy. The ?CON(s)? would include: not getting any of the treatment benefits. These benefits fall under one of three categories: diagnostic; therapeutic; and/or palliative. Diagnostic benefits include: getting information which can ultimately lead to improvement of the disease or symptom(s). Therapeutic benefits are those associated with the successful treatment of the disease. Finally, palliative benefits are those related to the decrease of the primary symptoms, without necessarily curing the condition (example: decreasing the pain from a flare-up of a chronic condition, such as incurable terminal cancer). ? ?General Risks and Complications: These are associated to most interventional treatments. They can occur alone, or in combination. They fall under one of the following six (6) categories: no benefit or worsening of symptoms; bleeding; infection; nerve damage; allergic reactions; and/or death. ?No benefits or worsening of symptoms: In Medicine there are no guarantees, only probabilities. No healthcare provider can ever guarantee that a medical treatment will work, they can only state the probability that it may. Furthermore, there is always the possibility that the condition may worsen, either directly, or indirectly, as a consequence of the treatment. ?Bleeding: This is more common if the patient is taking a blood thinner, either prescription or over the counter (example: Goody Powders, Fish oil, Aspirin, Garlic, etc.), or if suffering a condition associated with impaired coagulation (example: Hemophilia, cirrhosis of the liver, low platelet counts, etc.). However, even if you do not have one on these, it can still happen. If you have any of these conditions, or take one of these drugs, make sure to notify your treating physician. ?Infection: This is more common in patients with a compromised  immune system, either due to disease (example:   diabetes, cancer, human immunodeficiency virus [HIV], etc.), or due to medications or treatments (example: therapies used to treat cancer and rheumatological diseas

## 2021-06-24 NOTE — Progress Notes (Signed)
Safety precautions to be maintained throughout the outpatient stay will include: orient to surroundings, keep bed in low position, maintain call bell within reach at all times, provide assistance with transfer out of bed and ambulation.  

## 2021-06-25 DIAGNOSIS — M5459 Other low back pain: Secondary | ICD-10-CM | POA: Diagnosis not present

## 2021-06-25 DIAGNOSIS — R262 Difficulty in walking, not elsewhere classified: Secondary | ICD-10-CM | POA: Diagnosis not present

## 2021-06-25 DIAGNOSIS — M5451 Vertebrogenic low back pain: Secondary | ICD-10-CM | POA: Diagnosis not present

## 2021-07-02 DIAGNOSIS — M5451 Vertebrogenic low back pain: Secondary | ICD-10-CM | POA: Diagnosis not present

## 2021-07-02 DIAGNOSIS — R262 Difficulty in walking, not elsewhere classified: Secondary | ICD-10-CM | POA: Diagnosis not present

## 2021-07-02 DIAGNOSIS — M5459 Other low back pain: Secondary | ICD-10-CM | POA: Diagnosis not present

## 2021-07-03 ENCOUNTER — Encounter: Payer: Self-pay | Admitting: Psychiatry

## 2021-07-03 ENCOUNTER — Ambulatory Visit (INDEPENDENT_AMBULATORY_CARE_PROVIDER_SITE_OTHER): Payer: Medicare HMO | Admitting: Psychiatry

## 2021-07-03 DIAGNOSIS — R69 Illness, unspecified: Secondary | ICD-10-CM | POA: Diagnosis not present

## 2021-07-03 DIAGNOSIS — F411 Generalized anxiety disorder: Secondary | ICD-10-CM

## 2021-07-03 DIAGNOSIS — G894 Chronic pain syndrome: Secondary | ICD-10-CM

## 2021-07-03 DIAGNOSIS — F3342 Major depressive disorder, recurrent, in full remission: Secondary | ICD-10-CM

## 2021-07-03 DIAGNOSIS — F331 Major depressive disorder, recurrent, moderate: Secondary | ICD-10-CM

## 2021-07-03 MED ORDER — PAROXETINE HCL 20 MG PO TABS: ORAL_TABLET | ORAL | 1 refills | Status: DC

## 2021-07-03 MED ORDER — PAROXETINE HCL 40 MG PO TABS: ORAL_TABLET | ORAL | 1 refills | Status: DC

## 2021-07-03 MED ORDER — CLONIDINE HCL 0.1 MG PO TABS: ORAL_TABLET | ORAL | 1 refills | Status: DC

## 2021-07-03 MED ORDER — GABAPENTIN 100 MG PO CAPS: ORAL_CAPSULE | ORAL | 1 refills | Status: DC

## 2021-07-03 NOTE — Progress Notes (Signed)
Joseph Arroyo ?[id number] ?04-03-1940 ?81 y.o. ? ?Virtual Visit via Telephone Note ? ?I connected with pt by telephone and verified that I am speaking with the correct person using two identifiers. ?  ?I discussed the limitations, risks, security and privacy concerns of performing an evaluation and management service by telephone and the availability of in person appointments. I also discussed with the patient that there may be a patient responsible charge related to this service. The patient expressed understanding and agreed to proceed. ? ?I discussed the assessment and treatment plan with the patient. The patient was provided an opportunity to ask questions and all were answered. The patient agreed with the plan and demonstrated an understanding of the instructions. ?  ?The patient was advised to call back or seek an in-person evaluation if the symptoms worsen or if the condition fails to improve as anticipated. ? ?I provided 30 minutes of non-face-to-face time during this encounter. The call started at 145 and ended at 2:15 PM. The patient was located at home and the provider was located office. ? ? ?Subjective:  ? ?Patient ID:  Joseph Arroyo is a 81 y.o. (DOB Jan 01, 1941) male. ? ?Chief Complaint:  ?Chief Complaint  ?Patient presents with  ? Follow-up  ? GAD (generalized anxiety disorder)  ? Major depression, recurrent, full remission   ? Other  ?  Chronic pain and Parkinson's disease  ? ? ?Depression ?       Associated symptoms include no fatigue.  Past medical history includes anxiety.   ?Anxiety ?Patient reports no dizziness or palpitations.  ? ? ?Joseph Arroyo ( lor-on) presents to the office today for follow-up of major depression and generalized anxiety disorder.   ? ?He was seen December, 2020 & June 2021.  No meds were changed.  Continued paroxetine 60 mg daily plus gabapentin 100 mg twice daily and Mirapex 0.125 mg 3 times daily.  ? ?08/2019 appt without med changes noted: ?Tolerates the  meds well.  Wife thinks he's doing very well and he agrees.   No complaints.  ?2 D's.   ?Still exercising and has fatigue but can function and enjoys activity. 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  He feels positive and hopeful and useful.  At some point would like to be free of medication.  Does not feel guilty about the meds.   ? ?03/19/2020 appt noted:  Seen with wife Joseph Arroyo ?Covid August and took Ivermectin and both had it mild. ?As far mood concerned he's doing well.  Wife agrees.  Not markedly depressed or anxious.  Feels the suffering of others.  Joseph Arroyo in hospital in [place].  Friend with Covid also. ?No episodes. ?Tolerating meds.  No Xanax used.  ?Attend [employer] in [place]. ?Usually sleep well but back pain can interfere with sleep. ?[place] for PD and a bit slower per wife. ?Plan: Try increasing gabapentin to 100 mg AM & 200 mg PM for back pain.   ?Continue paroxetine 60 mg daily and Deplin 15 mg daily ? ?10/03/2020 appointment with the following noted: Seen with wife, Joseph Arroyo ?Off and on increased gabapentin at PM to 200 mg for back pain and sleeps better.   ?Not depressed.  Anxiety is manageable.   ?Seeing chiropracter who he likes [name] in [place]. ?No SE noted with meds.  Perspire heavily. ?No panic attacks.   ?Sleep variable and is usually OK. 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 ?Uses [employer] ?Plan: No med change ? ?03/25/2021 appointment with the following  noted: with wife Joseph Arroyo ?Embarrassing but came on quickly. ?Shaking with anxiety and been going on for 3-4 weeks. ?Rare use of Xanax. ?A lot of physical px with constant pain with back. Pending workup.  Fell 3 weeks ago. T pain clinic now.  ?Everything seemed to pile up. ?Insomnia with mind racing with worry.  He can't tolerate negativity.   ?Rare panic. ?Consistent with paroxetine 60 mg daily. ?Consistent with gabapentin 100 AM and 200 PM ?Plan: For persistent symptoms of anxiety we will start off label trial of clonidine.  Cautioned about side  effects. ? ?05/13/2021 appointment with the following noted: wife Joseph Arroyo on call ?Taking clonidine 0.1 mg tablet 1/2 tablets twice daily. Can't tell if there has been benefit from this.   ?Still taking Xanax but usually about one daily. ?Couldn't get here DT pain.  Been to pain clinic but no meds yet.  Problems with insurance company giving approval for the tests.  ?At times pain is excruciating.   ?BP has been OK.   ?Being cautious about getting up and protecting from dizziness. ?Tremors will wax and wane and worse with stress.   ?Fighting over the idea God does not want him to be like this and feels there is something wrong with him.   ?No SI  "that's not an option".   ?Plan: Continue paroxetine 60 mg daily ?DT PD hesitate to use atypicals for TR anxiety. ?Consider increasing gabapentin for anxiety off label.  ?Yes increase to 200 mg BID ?Ok Xanax  ?Clonidine 0.1 mg tablets 1/2 tablet twice daily   ? ?07/03/2021 appointment with the following noted: seen with Joseph Arroyo on call ?Using Xanax prn 1-2 daily. ?Increased gabapentin 100 mg AM and noon and 200 mg PM; hard to tell if it helped any but it might have.  No SE noted. ?Feels fine today and thinks there's improvement.  Believe the Reita Cliche is bringing him through this. ?Epidural recently.  Some pain relief. ?Per wife has had some anxious moments.   ?Tremors vary.   ?Some PT, balance a little worse since Xmas.   ?No low BP. ?When sleep is fine but 2-3 hours at a time and nocturia. ?He thinks depression is a lot better, today I feel good.  Needs to get out socially.  He's a people person.  She thinks today is best day since Jan.   ? ?Was 12 years free of medication until this last relapse and yet it took a long time to get relief again.  Wife, Joseph Arroyo, said overall he's been doing well.  No prn BZ in a couple of years. ? ?Past Psychiatric Medication Trials: Under our care since 2013. ?Paroxetine 60, duloxetine, fluoxetine, Luvox, venlafaxine, Trintellix, ,  ?mirtazapine,doxepin,   ?Gabapentin 200 BID ?Deplin, clonazepam, lorazepam  ?lamotrigine,  buspirone,   ?Clonidine 0.05 BID ?Abilify, Seroquel, Zyprexa,  risperidone, Saphris,  ?lithium with some benefit,  ? ?pramipexole ? ?Review of Systems:  ?Review of Systems  ?Constitutional:  Negative for fatigue.  ?Cardiovascular:  Negative for palpitations.  ?Gastrointestinal:  Positive for diarrhea.  ?Musculoskeletal:  Positive for back pain and gait problem.  ?Neurological:  Positive for tremors and weakness. Negative for dizziness.  ? ?Medications: I have reviewed the patient's current medications. ? ?Current Outpatient Medications  ?Medication Sig Dispense Refill  ? ALPRAZolam (XANAX) 0.25 MG tablet Take 1 tablet (0.25 mg total) by mouth 3 (three) times daily as needed for anxiety. 90 tablet 1  ? B Complex Vitamins (VITAMIN B COMPLEX PO) Take 1  tablet by mouth daily.    ? baclofen (LIORESAL) 10 MG tablet TAKE 1/2-1 TABLET BY MOUTH TWICE A DAY AS NEEDED FOR MUSCLE SPASMS 180 tablet 1  ? Capsicum-Garlic 827-078 MG CAPS Take 200-300 mg by mouth.    ? carbidopa-levodopa (SINEMET IR) 25-100 MG tablet Take 2 tablets by mouth 3 (three) times daily.    ? Carbidopa-Levodopa ER (SINEMET CR) 25-100 MG tablet controlled release Take 1 tablet by mouth daily.    ? clotrimazole-betamethasone (LOTRISONE) cream Apply 1-2 times a day for worsening flare dry skin dermatitis of toes/feet, may re-use daily up to 1 week as needed. 30 g 1  ? co-enzyme Q-10 30 MG capsule Take 30 mg by mouth daily.    ? HAWTHORNE BERRY PO Take by mouth daily.    ? ibuprofen (ADVIL,MOTRIN) 200 MG tablet Take 200 mg by mouth every 6 (six) hours as needed for moderate pain.    ? L-Methylfolate 15 MG TABS Take 1 tablet (15 mg total) by mouth daily. 90 tablet 3  ? Lutein 10 MG TABS Take 1 tablet by mouth 3 (three) times daily. 1 daily    ? Magnesium 200 MG TABS Take 200 mg by mouth daily.    ? pramipexole (MIRAPEX) 0.125 MG tablet Take 0.125 mg by mouth 2 (two) times daily.    ? Saw  Palmetto, Serenoa repens, 1000 MG CAPS Take 2 capsules by mouth daily.    ? cloNIDine (CATAPRES) 0.1 MG tablet 1/2 tablet twice daily 90 tablet 1  ? gabapentin (NEURONTIN) 100 MG capsule 2 CAPSULES twice daily

## 2021-07-06 NOTE — Progress Notes (Signed)
PROVIDER NOTE: Interpretation of information contained herein should be left to medically-trained personnel. Specific patient instructions are provided elsewhere under "Patient Instructions" section of medical record. This document was created in part using STT-dictation technology, any transcriptional errors that may result from this process are unintentional.  ?Patient: Jaylend Reiland Grindle ?Type: Established ?DOB: [birth year] ?MRN: [id number] ?PCP: Olin Hauser, DO  Service: Procedure ?DOS: 07/08/2021 ?Setting: Ambulatory ?Location: Ambulatory outpatient facility ?Delivery: Face-to-face Provider: Gaspar Cola, MD ?Specialty: Interventional Pain Management ?Specialty designation: 09 ?Location: Outpatient facility ?Ref. Prov.: Nobie Putnam *   ? ?Primary Reason for Visit: Interventional Pain Management Treatment. ?CC: Back Pain (lower) ?  ?Procedure:          ? Type: Lumbar Facet, Medial Branch Block(s) #1  ?Laterality: Bilateral  ?Level: L2, L3, L4, L5, & S1 Medial Branch Level(s). Injecting these levels blocks the L3-4 and L5-S1 lumbar facet joints. ?Imaging: Fluoroscopic guidance ?Anesthesia: Local anesthesia (1-2% Lidocaine) ?Anxiolysis: IV Versed 2.0 mg ?Sedation: None. ?DOS: 07/08/2021 ?Performed by: Gaspar Cola, MD ? ?Primary Purpose: Diagnostic/Therapeutic ?Indications: Low back pain severe enough to impact quality of life or function. ?1. Lumbar facet syndrome (Bilateral)   ?2. Spondylosis without myelopathy or radiculopathy, lumbosacral region   ?3. Lumbosacral facet hypertrophy   ?4. Lumbosacral facet arthropathy (Left: L3-4, L4-5, and L5-S1)   ?5. DDD (degenerative disc disease), lumbosacral   ?6. Chronic low back pain (1ry area of Pain) (Bilateral) (R>L) w/o sciatica   ?7. Abnormal MRI, lumbar spine (04/23/2021)   ? ?NAS-11 Pain score:  ? Pre-procedure: 6 /10  ? Post-procedure: 6 /10  ? ?  ?Position / Prep / Materials:  ?Position: Prone  ?Prep solution: DuraPrep (Iodine  Povacrylex [0.7% available iodine] and Isopropyl Alcohol, 74% w/w) ?Area Prepped: Posterolateral Lumbosacral Spine (Wide prep: From the lower border of the scapula down to the end of the tailbone and from flank to flank.) ? ?Materials:  ?Tray: Block ?Needle(s):  ?Type: Spinal  ?Gauge (G): 22  ?Length: 5-in ?Qty: 4 ? ?Pre-op H&P Assessment:  ?Mr. Brucato is a 81 y.o. (year old), male patient, seen today for interventional treatment. He  has a past surgical history that includes Other surgical history (2002); Colonoscopy (2008); Stapedes surgery (Right, 2002); Cataract extraction (Right, 07/18/14); and Cataract extraction w/PHACO (Left, 08/01/2014). Mr. Isabell has a current medication list which includes the following prescription(s): alprazolam, b complex vitamins, baclofen, capsicum-garlic, carbidopa-levodopa, carbidopa-levodopa er, clonidine, clotrimazole-betamethasone, co-enzyme q-10, gabapentin, hawthorn, ibuprofen, l-methylfolate, lutein, magnesium, paroxetine, paroxetine, pramipexole, saw palmetto (serenoa repens), omega-3 acid ethyl esters, and psyllium, and the following Facility-Administered Medications: pentafluoroprop-tetrafluoroeth. His primarily concern today is the Back Pain (lower) ? ?Initial Vital Signs:  ?Pulse/HCG Rate: 64ECG Heart Rate: (!) 53 ?Temp: 98.2 ?F (36.8 ?C) ?Resp: 16 ?BP: 98/68 ?SpO2: 100 % ? ?BMI: Estimated body mass index is 20.05 kg/m? as calculated from the following: ?  Height as of this encounter: '5\' 7"'$  (1.702 m). ?  Weight as of this encounter: 128 lb (58.1 kg). ? ?Risk Assessment: ?Allergies: Reviewed. He is allergic to prednisone and wheat bran.  ?Allergy Precautions: None required ?Coagulopathies: Reviewed. None identified.  ?Blood-thinner therapy: None at this time ?Active Infection(s): Reviewed. None identified. Mr. Pandit is afebrile ? ?Site Confirmation: Mr. Maggio was asked to confirm the procedure and laterality before marking the site ?Procedure checklist:  Completed ?Consent: Before the procedure and under the influence of no sedative(s), amnesic(s), or anxiolytics, the patient was informed of the treatment options, risks and possible complications. To fulfill  our ethical and legal obligations, as recommended by the American Medical Association's Code of Ethics, I have informed the patient of my clinical impression; the nature and purpose of the treatment or procedure; the risks, benefits, and possible complications of the intervention; the alternatives, including doing nothing; the risk(s) and benefit(s) of the alternative treatment(s) or procedure(s); and the risk(s) and benefit(s) of doing nothing. ?The patient was provided information about the general risks and possible complications associated with the procedure. These may include, but are not limited to: failure to achieve desired goals, infection, bleeding, organ or nerve damage, allergic reactions, paralysis, and death. ?In addition, the patient was informed of those risks and complications associated to Spine-related procedures, such as failure to decrease pain; infection (i.e.: Meningitis, epidural or intraspinal abscess); bleeding (i.e.: epidural hematoma, subarachnoid hemorrhage, or any other type of intraspinal or peri-dural bleeding); organ or nerve damage (i.e.: Any type of peripheral nerve, nerve root, or spinal cord injury) with subsequent damage to sensory, motor, and/or autonomic systems, resulting in permanent pain, numbness, and/or weakness of one or several areas of the body; allergic reactions; (i.e.: anaphylactic reaction); and/or death. ?Furthermore, the patient was informed of those risks and complications associated with the medications. These include, but are not limited to: allergic reactions (i.e.: anaphylactic or anaphylactoid reaction(s)); adrenal axis suppression; blood sugar elevation that in diabetics may result in ketoacidosis or comma; water retention that in patients with history  of congestive heart failure may result in shortness of breath, pulmonary edema, and decompensation with resultant heart failure; weight gain; swelling or edema; medication-induced neural toxicity; particulate matter embolism and blood vessel occlusion with resultant organ, and/or nervous system infarction; and/or aseptic necrosis of one or more joints. ?Finally, the patient was informed that Medicine is not an exact science; therefore, there is also the possibility of unforeseen or unpredictable risks and/or possible complications that may result in a catastrophic outcome. The patient indicated having understood very clearly. We have given the patient no guarantees and we have made no promises. Enough time was given to the patient to ask questions, all of which were answered to the patient's satisfaction. Mr. Peace has indicated that he wanted to continue with the procedure. ?Attestation: I, the ordering provider, attest that I have discussed with the patient the benefits, risks, side-effects, alternatives, likelihood of achieving goals, and potential problems during recovery for the procedure that I have provided informed consent. ?Date  Time: 07/08/2021  9:33 AM ? ?Pre-Procedure Preparation:  ?Monitoring: As per clinic protocol. Respiration, ETCO2, SpO2, BP, heart rate and rhythm monitor placed and checked for adequate function ?Safety Precautions: Patient was assessed for positional comfort and pressure points before starting the procedure. ?Time-out: I initiated and conducted the "Time-out" before starting the procedure, as per protocol. The patient was asked to participate by confirming the accuracy of the "Time Out" information. Verification of the correct person, site, and procedure were performed and confirmed by me, the nursing staff, and the patient. "Time-out" conducted as per Joint Commission's Universal Protocol (UP.01.01.01). ?Time: 3354 ? ?Description of Procedure:          ?Laterality: Bilateral.  The procedure was performed in identical fashion on both sides. ?Targeted Levels:  L2, L3, L4, L5, & S1 Medial Branch Level(s) ? ?Safety Precautions: Aspiration looking for blood return was conducted prior

## 2021-07-08 ENCOUNTER — Encounter: Payer: Self-pay | Admitting: Pain Medicine

## 2021-07-08 ENCOUNTER — Ambulatory Visit
Admission: RE | Admit: 2021-07-08 | Discharge: 2021-07-08 | Disposition: A | Discharge status: Home / Self Care | Payer: Medicare HMO | Source: Ambulatory Visit | Attending: Pain Medicine | Admitting: Pain Medicine

## 2021-07-08 ENCOUNTER — Ambulatory Visit (HOSPITAL_BASED_OUTPATIENT_CLINIC_OR_DEPARTMENT_OTHER): Payer: Medicare HMO | Admitting: Pain Medicine

## 2021-07-08 VITALS — BP 107/69 | HR 64 | Temp 98.2°F | Resp 12 | Ht 67.0 in | Wt 128.0 lb

## 2021-07-08 DIAGNOSIS — Z79899 Other long term (current) drug therapy: Secondary | ICD-10-CM | POA: Insufficient documentation

## 2021-07-08 DIAGNOSIS — M47817 Spondylosis without myelopathy or radiculopathy, lumbosacral region: Secondary | ICD-10-CM | POA: Insufficient documentation

## 2021-07-08 DIAGNOSIS — G8929 Other chronic pain: Secondary | ICD-10-CM | POA: Diagnosis not present

## 2021-07-08 DIAGNOSIS — Z9889 Other specified postprocedural states: Secondary | ICD-10-CM | POA: Diagnosis not present

## 2021-07-08 DIAGNOSIS — Z791 Long term (current) use of non-steroidal anti-inflammatories (NSAID): Secondary | ICD-10-CM | POA: Diagnosis not present

## 2021-07-08 DIAGNOSIS — M5137 Other intervertebral disc degeneration, lumbosacral region: Secondary | ICD-10-CM | POA: Diagnosis not present

## 2021-07-08 DIAGNOSIS — M47896 Other spondylosis, lumbar region: Secondary | ICD-10-CM | POA: Diagnosis not present

## 2021-07-08 DIAGNOSIS — M545 Low back pain, unspecified: Secondary | ICD-10-CM | POA: Diagnosis not present

## 2021-07-08 DIAGNOSIS — M47816 Spondylosis without myelopathy or radiculopathy, lumbar region: Secondary | ICD-10-CM | POA: Insufficient documentation

## 2021-07-08 DIAGNOSIS — R937 Abnormal findings on diagnostic imaging of other parts of musculoskeletal system: Secondary | ICD-10-CM | POA: Insufficient documentation

## 2021-07-08 MED ORDER — LIDOCAINE HCL 2 % IJ SOLN
20.0000 mL | Freq: Once | INTRAMUSCULAR | Status: AC
  Administered 2021-07-08: 400 mg

## 2021-07-08 MED ORDER — TRIAMCINOLONE ACETONIDE 40 MG/ML IJ SUSP
80.0000 mg | Freq: Once | INTRAMUSCULAR | Status: AC
  Administered 2021-07-08: 80 mg

## 2021-07-08 MED ORDER — TRIAMCINOLONE ACETONIDE 40 MG/ML IJ SUSP
INTRAMUSCULAR | Status: AC
  Filled 2021-07-08: qty 2

## 2021-07-08 MED ORDER — ROPIVACAINE HCL 2 MG/ML IJ SOLN
INTRAMUSCULAR | Status: AC
  Filled 2021-07-08: qty 20

## 2021-07-08 MED ORDER — LACTATED RINGERS IV SOLN
1000.0000 mL | Freq: Once | INTRAVENOUS | Status: AC
  Administered 2021-07-08: 1000 mL via INTRAVENOUS

## 2021-07-08 MED ORDER — LIDOCAINE HCL 2 % IJ SOLN
INTRAMUSCULAR | Status: AC
  Filled 2021-07-08: qty 20

## 2021-07-08 MED ORDER — ROPIVACAINE HCL 2 MG/ML IJ SOLN
18.0000 mL | Freq: Once | INTRAMUSCULAR | Status: AC
  Administered 2021-07-08: 18 mL via PERINEURAL

## 2021-07-08 MED ORDER — PENTAFLUOROPROP-TETRAFLUOROETH EX AERO: INHALATION_SPRAY | Freq: Once | CUTANEOUS | Status: DC

## 2021-07-08 MED ORDER — MIDAZOLAM HCL 5 MG/5ML IJ SOLN
0.5000 mg | Freq: Once | INTRAMUSCULAR | Status: AC
  Administered 2021-07-08: 2 mg via INTRAVENOUS
  Filled 2021-07-08: qty 5

## 2021-07-08 NOTE — Patient Instructions (Signed)

## 2021-07-09 ENCOUNTER — Telehealth: Payer: Self-pay | Admitting: *Deleted

## 2021-07-09 NOTE — Telephone Encounter (Signed)
Post procedure call;  patient states he had trouble urinating over night.  States he did not take his pain medicine last night.  I encouraged patient to stay well hydrated today and continue to monitor.  He will call us if he needs [place].  ?

## 2021-07-10 ENCOUNTER — Encounter (INDEPENDENT_AMBULATORY_CARE_PROVIDER_SITE_OTHER): Payer: Self-pay

## 2021-07-10 ENCOUNTER — Ambulatory Visit (INDEPENDENT_AMBULATORY_CARE_PROVIDER_SITE_OTHER): Payer: Self-pay | Admitting: Vascular Surgery

## 2021-07-14 DIAGNOSIS — M5451 Vertebrogenic low back pain: Secondary | ICD-10-CM | POA: Diagnosis not present

## 2021-07-14 DIAGNOSIS — R262 Difficulty in walking, not elsewhere classified: Secondary | ICD-10-CM | POA: Diagnosis not present

## 2021-07-14 DIAGNOSIS — M5459 Other low back pain: Secondary | ICD-10-CM | POA: Diagnosis not present

## 2021-07-17 DIAGNOSIS — G20 Parkinson's disease: Secondary | ICD-10-CM | POA: Diagnosis not present

## 2021-07-21 ENCOUNTER — Other Ambulatory Visit: Payer: Self-pay | Admitting: Psychiatry

## 2021-07-21 ENCOUNTER — Telehealth: Payer: Self-pay | Admitting: Psychiatry

## 2021-07-21 MED ORDER — ALPRAZOLAM 0.25 MG PO TABS: 0.2500 mg | ORAL_TABLET | Freq: Three times a day (TID) | ORAL | 1 refills | Status: DC | PRN

## 2021-07-21 NOTE — Telephone Encounter (Signed)
Wife left a message at 4:03 pm and said that the pharmacy is out of stock of the xanax. Please send in a new script to the [facility] located at [address] in [place]

## 2021-07-22 DIAGNOSIS — R262 Difficulty in walking, not elsewhere classified: Secondary | ICD-10-CM | POA: Diagnosis not present

## 2021-07-22 DIAGNOSIS — M5459 Other low back pain: Secondary | ICD-10-CM | POA: Diagnosis not present

## 2021-07-22 DIAGNOSIS — M5451 Vertebrogenic low back pain: Secondary | ICD-10-CM | POA: Diagnosis not present

## 2021-07-24 DIAGNOSIS — M5451 Vertebrogenic low back pain: Secondary | ICD-10-CM | POA: Diagnosis not present

## 2021-07-24 DIAGNOSIS — R262 Difficulty in walking, not elsewhere classified: Secondary | ICD-10-CM | POA: Diagnosis not present

## 2021-07-24 DIAGNOSIS — M5459 Other low back pain: Secondary | ICD-10-CM | POA: Diagnosis not present

## 2021-07-29 DIAGNOSIS — R262 Difficulty in walking, not elsewhere classified: Secondary | ICD-10-CM | POA: Diagnosis not present

## 2021-07-29 DIAGNOSIS — M5459 Other low back pain: Secondary | ICD-10-CM | POA: Diagnosis not present

## 2021-07-29 DIAGNOSIS — M5451 Vertebrogenic low back pain: Secondary | ICD-10-CM | POA: Diagnosis not present

## 2021-07-31 ENCOUNTER — Ambulatory Visit: Payer: Medicare HMO | Admitting: Pain Medicine

## 2021-07-31 DIAGNOSIS — M5451 Vertebrogenic low back pain: Secondary | ICD-10-CM | POA: Diagnosis not present

## 2021-07-31 DIAGNOSIS — R262 Difficulty in walking, not elsewhere classified: Secondary | ICD-10-CM | POA: Diagnosis not present

## 2021-07-31 DIAGNOSIS — M5459 Other low back pain: Secondary | ICD-10-CM | POA: Diagnosis not present

## 2021-08-01 DIAGNOSIS — B351 Tinea unguium: Secondary | ICD-10-CM | POA: Diagnosis not present

## 2021-08-01 DIAGNOSIS — B353 Tinea pedis: Secondary | ICD-10-CM | POA: Diagnosis not present

## 2021-08-04 NOTE — Progress Notes (Unsigned)
PROVIDER NOTE: Information contained herein reflects review and annotations entered in association with encounter. Interpretation of such information and data should be left to medically-trained personnel. Information provided to patient can be located elsewhere in the medical record under "Patient Instructions". Document created using STT-dictation technology, any transcriptional errors that may result from process are unintentional.    Patient: Joseph Arroyo  Service Category: E/M  Provider: Gaspar Cola, MD  DOB: [birth year]  DOS: 08/05/2021  Specialty: Interventional Pain Management  MRN: [id number]  Setting: Ambulatory outpatient  PCP: Olin Hauser, DO  Type: Established Patient    Referring Provider: Nobie Putnam *  Location: Office  Delivery: Face-to-face     HPI  [name], a 81 y.o. year old male, is here today because of his Lumbar facet joint syndrome [M47.816]. Joseph Arroyo's primary complain today is No chief complaint on file. Last encounter: My last encounter with him was on 07/08/2021. Pertinent problems: [name] has Parkinson's disease ([place]); Chronic knee pain (Left); Derangement of medial meniscus, posterior horn (Left); PAD (peripheral artery disease) ([place]); Chronic pain syndrome; Grade 1 Retrolisthesis of L2/L3 (5 mm) and L3/L4 (3 mm); Levoscoliosis of lumbar spine (L3-4 apex); DDD (degenerative disc disease), lumbosacral; Lumbosacral facet arthropathy (Left: L3-4, L4-5, and L5-S1); Tricompartment osteoarthritis of knee (Left); Baker cyst (Left); Chronic low back pain (1ry area of Pain) (Bilateral) (R>L) w/o sciatica; Lumbar facet syndrome (Bilateral); Chronic lower extremity pain (2ry area of Pain) (Bilateral) (L>R); Lumbosacral radiculitis/sensory radiculopathy at L2 (Bilateral); Lumbosacral radiculitis/sensory radiculopathy at L3 (Bilateral); Abnormal MRI, lumbar spine (04/23/2021); Lumbosacral facet hypertrophy; Lumbar central  spinal stenosis, w/o neurogenic claudication (L4-5); Lumbar lateral recess stenosis (Bilateral: L2-3, L4-5) (Right: L3-4); Lumbosacral foraminal stenosis (Bilateral: L2-3, L3-4) (Left: L5-S1); Lumbar nerve root impingement (Right: L3 at L2-3 & L3-4); Ligamentum flavum hypertrophy (L3-4, L4-5); and Spondylosis without myelopathy or radiculopathy, lumbosacral region on their pertinent problem list. Pain Assessment: Severity of   is reported as a  /10. Location:    / . Onset:  . Quality:  . Timing:  . Modifying factor(s):  Marland Kitchen Vitals:  vitals were not taken for this visit.   Reason for encounter: post-procedure evaluation and assessment. ***  Post-procedure evaluation   Type: Lumbar Facet, Medial Branch Block(s) #1  Laterality: Bilateral  Level: L2, L3, L4, L5, & S1 Medial Branch Level(s). Injecting these levels blocks the L3-4 and L5-S1 lumbar facet joints. Imaging: Fluoroscopic guidance Anesthesia: Local anesthesia (1-2% Lidocaine) Anxiolysis: IV Versed 2.0 mg Sedation: None. DOS: 07/08/2021 Performed by: Gaspar Cola, MD  Primary Purpose: Diagnostic/Therapeutic Indications: Low back pain severe enough to impact quality of life or function. 1. Lumbar facet syndrome (Bilateral)   2. Spondylosis without myelopathy or radiculopathy, lumbosacral region   3. Lumbosacral facet hypertrophy   4. Lumbosacral facet arthropathy (Left: L3-4, L4-5, and L5-S1)   5. DDD (degenerative disc disease), lumbosacral   6. Chronic low back pain (1ry area of Pain) (Bilateral) (R>L) w/o sciatica   7. Abnormal MRI, lumbar spine (04/23/2021)    NAS-11 Pain score:   Pre-procedure: 6 /10   Post-procedure: 6 /10     Effectiveness:  Initial hour after procedure:   ***. Subsequent 4-6 hours post-procedure:   ***. Analgesia past initial 6 hours:   ***. Ongoing improvement:  Analgesic:  *** Function:  ***  ROM:  ***   Pharmacotherapy Assessment  Analgesic: None MME/day: 0 mg/day   Monitoring: [place] PMP:  PDMP not reviewed this encounter.  Pharmacotherapy: No side-effects or adverse reactions reported. Compliance: No problems identified. Effectiveness: Clinically acceptable.  No notes on file  UDS:  Summary  Date Value Ref Range Status  03/25/2021 Note  Final    Comment:    ==================================================================== Compliance Drug Analysis, Ur ==================================================================== Test                             Result       Flag       Units  Drug Present and Declared for Prescription Verification   Gabapentin                     PRESENT      EXPECTED   Baclofen                       PRESENT      EXPECTED   Paroxetine                     PRESENT      EXPECTED   Ibuprofen                      PRESENT      EXPECTED  Drug Present not Declared for Prescription Verification   Acetaminophen                  PRESENT      UNEXPECTED  Drug Absent but Declared for Prescription Verification   Alprazolam                     Not Detected UNEXPECTED ng/mg creat ==================================================================== Test                      Result    Flag   Units      Ref Range   Creatinine              122              mg/dL      >=20 ==================================================================== Declared Medications:  The flagging and interpretation on this report are based on the  following declared medications.  Unexpected results may arise from  inaccuracies in the declared medications.   **Note: The testing scope of this panel includes these medications:   Alprazolam  Baclofen (Lioresal)  Gabapentin (Neurontin)  Paroxetine (Paxil)   **Note: The testing scope of this panel does not include small to  moderate amounts of these reported medications:   Ibuprofen (Advil)   **Note: The testing scope of this panel does not include the  following reported medications:   Betamethasone (Lotrisone)   Carbidopa (Sinemet)  Clotrimazole (Lotrisone)  Folic Acid  Levodopa (Sinemet)  Lutein  Magnesium  Omega-3 Fatty Acids  Pramipexole (Mirapex)  Supplement  Ubiquinone (CoQ10)  Vitamin B ==================================================================== For clinical consultation, please call [phone]. ====================================================================      ROS  Constitutional: Denies any fever or chills Gastrointestinal: No reported hemesis, hematochezia, vomiting, or acute GI distress Musculoskeletal: Denies any acute onset joint swelling, redness, loss of ROM, or weakness Neurological: No reported episodes of acute onset apraxia, aphasia, dysarthria, agnosia, amnesia, paralysis, loss of coordination, or loss of consciousness  Medication Review  ALPRAZolam, B Complex Vitamins, Capsicum-Garlic, Carbidopa-Levodopa ER, Hawthorn, L-Methylfolate, Lutein, Magnesium, PARoxetine, Psyllium, Saw Palmetto (Serenoa repens), baclofen, carbidopa-levodopa, cloNIDine, clotrimazole-betamethasone, co-enzyme Q-10, gabapentin, ibuprofen, omega-3 acid ethyl esters, and  pramipexole  History Review  Allergy: Joseph Arroyo is allergic to prednisone and wheat bran. Drug: Joseph Arroyo  reports no history of drug use. Alcohol:  reports no history of alcohol use. Tobacco:  reports that he has never smoked. He has never used smokeless tobacco. Social: Joseph Arroyo  reports that he has never smoked. He has never used smokeless tobacco. He reports that he does not drink alcohol and does not use drugs. Medical:  has a past medical history of Allergic rhinitis due to pollen (11/21/2007), Brachial neuritis or radiculitis NOS, Cervicalgia, Concussion, Costal chondritis, Depression, Essential and other specified forms of tremor, GERD (gastroesophageal reflux disease), Lumbago, Occlusion and stenosis of carotid artery without mention of cerebral infarction, and Seizures ([place]). Surgical: Mr.  Arroyo  has a past surgical history that includes Other surgical history (2002); Colonoscopy (2008); Stapedes surgery (Right, 2002); Cataract extraction (Right, 07/18/14); and Cataract extraction w/PHACO (Left, 08/01/2014). Family: family history includes Asthma in his father; Heart failure in his mother.  Laboratory Chemistry Profile   Renal Lab Results  Component Value Date   BUN 20 03/24/2021   CREATININE 0.79 36/64/4034   BCR NOT APPLICABLE 74/25/9563   GFRAA 100 09/07/2019   GFRNONAA >60 03/24/2021    Hepatic Lab Results  Component Value Date   AST 18 03/24/2021   ALT <5 03/24/2021   ALBUMIN 4.1 03/24/2021   ALKPHOS 51 03/24/2021    Electrolytes Lab Results  Component Value Date   NA 131 (L) 03/24/2021   K 3.9 03/24/2021   CL 94 (L) 03/24/2021   CALCIUM 9.1 03/24/2021   MG 2.0 03/24/2021    Bone Lab Results  Component Value Date   VD25OH 44 09/23/2020   25OHVITD1 32 03/24/2021   25OHVITD2 <1.0 03/24/2021   25OHVITD3 31 03/24/2021    Inflammation (CRP: Acute Phase) (ESR: Chronic Phase) Lab Results  Component Value Date   CRP 0.5 03/24/2021   ESRSEDRATE 4 03/24/2021         Note: Above Lab results reviewed.  Recent Imaging Review  DG PAIN CLINIC C-ARM 1-60 MIN NO REPORT Fluoro was used, but no Radiologist interpretation will be provided.  Please refer to "NOTES" tab for provider progress note. Note: Reviewed        Physical Exam  General appearance: Well nourished, well developed, and well hydrated. In no apparent acute distress Mental status: Alert, oriented x 3 (person, place, & time)       Respiratory: No evidence of acute respiratory distress Eyes: PERLA Vitals: There were no vitals taken for this visit. BMI: Estimated body mass index is 20.05 kg/m as calculated from the following:   Height as of 07/08/21: '5\' 7"'  (1.702 m).   Weight as of 07/08/21: 128 lb (58.1 kg). Ideal: Patient weight not recorded  Assessment   Diagnosis Status  1. Lumbar facet  syndrome (Bilateral)   2. Spondylosis without myelopathy or radiculopathy, lumbosacral region   3. Lumbosacral facet hypertrophy   4. Lumbosacral facet arthropathy (Left: L3-4, L4-5, and L5-S1)   5. DDD (degenerative disc disease), lumbosacral   6. Chronic low back pain (1ry area of Pain) (Bilateral) (R>L) w/o sciatica   7. Abnormal MRI, lumbar spine (04/23/2021)   8. Grade 1 Retrolisthesis of L2/L3 (5 mm) and L3/L4 (3 mm)   9. Lumbosacral radiculitis/sensory radiculopathy at L2 (Bilateral)   10. Lumbosacral radiculitis/sensory radiculopathy at L3 (Bilateral)   11. Lumbar central spinal stenosis, w/o neurogenic claudication (L4-5)    Controlled Controlled Controlled   Updated  Problems: No problems updated.  Plan of Care  Problem-specific:  No problem-specific Assessment & Plan notes found for this encounter.  Joseph Arroyo has a current medication list which includes the following long-term medication(s): clonidine, gabapentin, omega-3 acid ethyl esters, paroxetine, and paroxetine.  Pharmacotherapy (Medications Ordered): No orders of the defined types were placed in this encounter.  Orders:  No orders of the defined types were placed in this encounter.  Follow-up plan:   No follow-ups on file.     Interventional Therapies  Risk  Complexity Considerations:   Estimated body mass index is 20.98 kg/m as calculated from the following:   Height as of this encounter: '5\' 8"'  (1.727 m).   Weight as of this encounter: 138 lb (62.6 kg). Parkinson's  SENSITIVITY: Oral Prednisone (Hyperactivity)   Planned  Pending:   Diagnostic bilateral lumbar facet MBB #1  (07/08/2021)    Under consideration:   Diagnostic bilateral lumbar facet MBB #2  Diagnostic bilateral L2 and/or L3 TFESI #2    Completed:   Diagnostic/therapeutic right L2-3 LESI x1 (06/03/2021) (100/100/45/45)  Referral to physical therapy for LB PT as well as a back brace eval. (04/28/2021) (water aerobics)     Therapeutic  Palliative (PRN) options:   None established     Recent Visits Date Type Provider Dept  07/08/21 Procedure visit Milinda Pointer, MD [facility]  06/24/21 Office Visit Milinda Pointer, MD [facility]  06/03/21 Procedure visit Milinda Pointer, MD [facility]  Showing recent visits within past 90 days and meeting all other requirements Future Appointments Date Type Provider Dept  08/05/21 Appointment Milinda Pointer, MD [facility]  Showing future appointments within next 90 days and meeting all other requirements  I discussed the assessment and treatment plan with the patient. The patient was provided an opportunity to ask questions and all were answered. The patient agreed with the plan and demonstrated an understanding of the instructions.  Patient advised to call back or seek an in-person evaluation if the symptoms or condition worsens.  Duration of encounter: *** minutes.  Note by: Gaspar Cola, MD Date: 08/05/2021; Time: 2:42 PM

## 2021-08-05 ENCOUNTER — Encounter: Payer: Self-pay | Admitting: Pain Medicine

## 2021-08-05 ENCOUNTER — Ambulatory Visit: Payer: Medicare HMO | Attending: Pain Medicine | Admitting: Pain Medicine

## 2021-08-05 ENCOUNTER — Other Ambulatory Visit: Payer: Self-pay

## 2021-08-05 VITALS — BP 87/59 | HR 56 | Temp 97.2°F | Resp 16 | Ht 65.0 in | Wt 128.0 lb

## 2021-08-05 DIAGNOSIS — M47817 Spondylosis without myelopathy or radiculopathy, lumbosacral region: Secondary | ICD-10-CM | POA: Insufficient documentation

## 2021-08-05 DIAGNOSIS — R937 Abnormal findings on diagnostic imaging of other parts of musculoskeletal system: Secondary | ICD-10-CM | POA: Insufficient documentation

## 2021-08-05 DIAGNOSIS — M545 Low back pain, unspecified: Secondary | ICD-10-CM | POA: Insufficient documentation

## 2021-08-05 DIAGNOSIS — M48061 Spinal stenosis, lumbar region without neurogenic claudication: Secondary | ICD-10-CM

## 2021-08-05 DIAGNOSIS — M5417 Radiculopathy, lumbosacral region: Secondary | ICD-10-CM

## 2021-08-05 DIAGNOSIS — G8929 Other chronic pain: Secondary | ICD-10-CM | POA: Diagnosis not present

## 2021-08-05 DIAGNOSIS — M5137 Other intervertebral disc degeneration, lumbosacral region: Secondary | ICD-10-CM | POA: Diagnosis not present

## 2021-08-05 DIAGNOSIS — M542 Cervicalgia: Secondary | ICD-10-CM | POA: Diagnosis not present

## 2021-08-05 DIAGNOSIS — M47816 Spondylosis without myelopathy or radiculopathy, lumbar region: Secondary | ICD-10-CM | POA: Diagnosis not present

## 2021-08-05 DIAGNOSIS — M4310 Spondylolisthesis, site unspecified: Secondary | ICD-10-CM | POA: Diagnosis not present

## 2021-08-05 NOTE — Progress Notes (Signed)
Safety precautions to be maintained throughout the outpatient stay will include: orient to surroundings, keep bed in low position, maintain call bell within reach at all times, provide assistance with transfer out of bed and ambulation.  

## 2021-08-05 NOTE — Patient Instructions (Signed)
______________________________________________________________________  Preparing for Procedure with Sedation  NOTICE: Due to recent regulatory changes, starting on September 30, 2020, procedures requiring intravenous (IV) sedation will no longer be performed at the [facility].  These types of procedures are required to be performed at [facility] ambulatory surgery facility.  We are very sorry for the inconvenience.  Procedure appointments are limited to planned procedures: No Prescription Refills. No disability issues will be discussed. No medication changes will be discussed.  Instructions: Oral Intake: Do not eat or drink anything for at least 8 hours prior to your procedure. (Exception: Blood Pressure Medication. See below.) Transportation: A driver is required. You may not drive yourself after the procedure. Blood Pressure Medicine: Do not forget to take your blood pressure medicine with a sip of water the morning of the procedure. If your Diastolic (lower reading) is above 100 mmHg, elective cases will be cancelled/rescheduled. Blood thinners: These will need to be stopped for procedures. Notify our staff if you are taking any blood thinners. Depending on which one you take, there will be specific instructions on how and when to stop it. Diabetics on insulin: Notify the staff so that you can be scheduled 1st case in the morning. If your diabetes requires high dose insulin, take only  of your normal insulin dose the morning of the procedure and notify the staff that you have done so. Preventing infections: Shower with an antibacterial soap the morning of your procedure. Build-up your immune system: Take 1000 mg of Vitamin C with every meal (3 times a day) the day prior to your procedure. Antibiotics: Inform the staff if you have a condition or reason that requires you to take antibiotics before dental procedures. Pregnancy: If you are pregnant, call and cancel the procedure. Sickness: If  you have a cold, fever, or any active infections, call and cancel the procedure. Arrival: You must be in the facility at least 30 minutes prior to your scheduled procedure. Children: Do not bring children with you. Dress appropriately: There is always the possibility that your clothing may get soiled. Valuables: Do not bring any jewelry or valuables.  Reasons to call and reschedule or cancel your procedure: (Following these recommendations will minimize the risk of a serious complication.) Surgeries: Avoid having procedures within 2 weeks of any surgery. (Avoid for 2 weeks before or after any surgery). Flu Shots: Avoid having procedures within 2 weeks of a flu shots. (Avoid for 2 weeks before or after immunizations). Barium: Avoid having a procedure within 7-10 days after having had a radiological study involving the use of radiological contrast. (Myelograms, Barium swallow or enema study). Heart attacks: Avoid any elective procedures or surgeries for the initial 6 months after a "Myocardial Infarction" (Heart Attack). Blood thinners: It is imperative that you stop these medications before procedures. Let us know if you if you take any blood thinner.  Infection: Avoid procedures during or within two weeks of an infection (including chest colds or gastrointestinal problems). Symptoms associated with infections include: Localized redness, fever, chills, night sweats or profuse sweating, burning sensation when voiding, cough, congestion, stuffiness, runny nose, sore throat, diarrhea, nausea, vomiting, cold or Flu symptoms, recent or current infections. It is specially important if the infection is over the area that we intend to treat. Heart and lung problems: Symptoms that may suggest an active cardiopulmonary problem include: cough, chest pain, breathing difficulties or shortness of breath, dizziness, ankle swelling, uncontrolled high or unusually low blood pressure, and/or palpitations. If you are    experiencing any of these symptoms, cancel your procedure and contact your primary care physician for an evaluation.  Remember:  Regular Business hours are:  Monday to Thursday 8:00 AM to 4:00 PM  Provider's Schedule: Riata Ikeda, MD:  Procedure days: Tuesday and Thursday 7:30 AM to 4:00 PM  Bilal Lateef, MD:  Procedure days: Monday and Wednesday 7:30 AM to 4:00 PM ______________________________________________________________________  ____________________________________________________________________________________________  General Risks and Possible Complications  Patient Responsibilities: It is important that you read this as it is part of your informed consent. It is our duty to inform you of the risks and possible complications associated with treatments offered to you. It is your responsibility as a patient to read this and to ask questions about anything that is not clear or that you believe was not covered in this document.  Patient's Rights: You have the right to refuse treatment. You also have the right to change your mind, even after initially having agreed to have the treatment done. However, under this last option, if you wait until the last second to change your mind, you may be charged for the materials used up to that point.  Introduction: Medicine is not an exact science. Everything in Medicine, including the lack of treatment(s), carries the potential for danger, harm, or loss (which is by definition: Risk). In Medicine, a complication is a secondary problem, condition, or disease that can aggravate an already existing one. All treatments carry the risk of possible complications. The fact that a side effects or complications occurs, does not imply that the treatment was conducted incorrectly. It must be clearly understood that these can happen even when everything is done following the highest safety standards.  No treatment: You can choose not to proceed with the  proposed treatment alternative. The "PRO(s)" would include: avoiding the risk of complications associated with the therapy. The "CON(s)" would include: not getting any of the treatment benefits. These benefits fall under one of three categories: diagnostic; therapeutic; and/or palliative. Diagnostic benefits include: getting information which can ultimately lead to improvement of the disease or symptom(s). Therapeutic benefits are those associated with the successful treatment of the disease. Finally, palliative benefits are those related to the decrease of the primary symptoms, without necessarily curing the condition (example: decreasing the pain from a flare-up of a chronic condition, such as incurable terminal cancer).  General Risks and Complications: These are associated to most interventional treatments. They can occur alone, or in combination. They fall under one of the following six (6) categories: no benefit or worsening of symptoms; bleeding; infection; nerve damage; allergic reactions; and/or death. No benefits or worsening of symptoms: In Medicine there are no guarantees, only probabilities. No healthcare provider can ever guarantee that a medical treatment will work, they can only state the probability that it may. Furthermore, there is always the possibility that the condition may worsen, either directly, or indirectly, as a consequence of the treatment. Bleeding: This is more common if the patient is taking a blood thinner, either prescription or over the counter (example: Goody Powders, Fish oil, Aspirin, Garlic, etc.), or if suffering a condition associated with impaired coagulation (example: Hemophilia, cirrhosis of the liver, low platelet counts, etc.). However, even if you do not have one on these, it can still happen. If you have any of these conditions, or take one of these drugs, make sure to notify your treating physician. Infection: This is more common in patients with a compromised  immune system, either due to disease (example:   diabetes, cancer, human immunodeficiency virus [HIV], etc.), or due to medications or treatments (example: therapies used to treat cancer and rheumatological diseases). However, even if you do not have one on these, it can still happen. If you have any of these conditions, or take one of these drugs, make sure to notify your treating physician. Nerve Damage: This is more common when the treatment is an invasive one, but it can also happen with the use of medications, such as those used in the treatment of cancer. The damage can occur to small secondary nerves, or to large primary ones, such as those in the spinal cord and brain. This damage may be temporary or permanent and it may lead to impairments that can range from temporary numbness to permanent paralysis and/or brain death. Allergic Reactions: Any time a substance or material comes in contact with our body, there is the possibility of an allergic reaction. These can range from a mild skin rash (contact dermatitis) to a severe systemic reaction (anaphylactic reaction), which can result in death. Death: In general, any medical intervention can result in death, most of the time due to an unforeseen complication. ____________________________________________________________________________________________  

## 2021-08-19 ENCOUNTER — Ambulatory Visit: Payer: Medicare HMO | Attending: Neurology | Admitting: Speech Pathology

## 2021-08-19 ENCOUNTER — Ambulatory Visit: Payer: Medicare HMO

## 2021-08-19 DIAGNOSIS — R2689 Other abnormalities of gait and mobility: Secondary | ICD-10-CM | POA: Insufficient documentation

## 2021-08-19 DIAGNOSIS — R262 Difficulty in walking, not elsewhere classified: Secondary | ICD-10-CM | POA: Diagnosis not present

## 2021-08-19 DIAGNOSIS — R490 Dysphonia: Secondary | ICD-10-CM | POA: Diagnosis not present

## 2021-08-19 DIAGNOSIS — M6281 Muscle weakness (generalized): Secondary | ICD-10-CM

## 2021-08-19 DIAGNOSIS — R269 Unspecified abnormalities of gait and mobility: Secondary | ICD-10-CM | POA: Diagnosis not present

## 2021-08-19 DIAGNOSIS — G20 Parkinson's disease: Secondary | ICD-10-CM | POA: Diagnosis not present

## 2021-08-19 DIAGNOSIS — R278 Other lack of coordination: Secondary | ICD-10-CM

## 2021-08-19 DIAGNOSIS — R2681 Unsteadiness on feet: Secondary | ICD-10-CM

## 2021-08-19 DIAGNOSIS — R1312 Dysphagia, oropharyngeal phase: Secondary | ICD-10-CM | POA: Insufficient documentation

## 2021-08-19 DIAGNOSIS — R471 Dysarthria and anarthria: Secondary | ICD-10-CM | POA: Diagnosis not present

## 2021-08-19 NOTE — Therapy (Incomplete)
OUTPATIENT OCCUPATIONAL THERAPY PARKINSON'S EVALUATION  Patient Name: CLARKSON ROSSELLI MRN: [id number] DOB:[birth year], 81 y.o., male Today's Date: 08/19/2021  PCP: Marland Kitchen REFERRING PROVIDER: ***    Past Medical History:  Diagnosis Date   Allergic rhinitis due to pollen 11/21/2007   Brachial neuritis or radiculitis NOS    Cervicalgia    Concussion    age 27 - s/p accident   Costal chondritis    Depression    Essential and other specified forms of tremor    GERD (gastroesophageal reflux disease)    in past   Lumbago    Occlusion and stenosis of carotid artery without mention of cerebral infarction    Seizures [facility]    age 29 - after concussion   Past Surgical History:  Procedure Laterality Date   CATARACT EXTRACTION Right 07/18/14   CATARACT EXTRACTION W/PHACO Left 08/01/2014   Procedure: CATARACT EXTRACTION PHACO AND INTRAOCULAR LENS PLACEMENT ([place]);  Surgeon: Leandrew Koyanagi, MD;  Location: [place];  Service: Ophthalmology;  Laterality: Left;  IVA TOPICAL   COLONOSCOPY  2008   OTHER SURGICAL HISTORY  2002   ear surgery   STAPEDES SURGERY Right 2002   Patient Active Problem List   Diagnosis Date Noted   Cervicalgia 08/05/2021   Spondylosis without myelopathy or radiculopathy, lumbosacral region 06/24/2021   Lumbosacral facet hypertrophy 06/03/2021   Lumbar central spinal stenosis, w/o neurogenic claudication (L4-5) 06/03/2021   Lumbar lateral recess stenosis (Bilateral: L2-3, L4-5) (Right: L3-4) 06/03/2021   Lumbosacral foraminal stenosis (Bilateral: L2-3, L3-4) (Left: L5-S1) 06/03/2021   Lumbar nerve root impingement (Right: L3 at L2-3 & L3-4) 06/03/2021   Ligamentum flavum hypertrophy (L3-4, L4-5) 06/03/2021   Abnormal MRI, lumbar spine (04/23/2021) 04/24/2021   Long term prescription benzodiazepine use (alprazolam) (Xanax) 03/24/2021   Grade 1 Retrolisthesis of L2/L3 (5 mm) and L3/L4 (3 mm) 03/24/2021   Levoscoliosis of lumbar spine (L3-4 apex)  03/24/2021   DDD (degenerative disc disease), lumbosacral 03/24/2021   Lumbosacral facet arthropathy (Left: L3-4, L4-5, and L5-S1) 03/24/2021   Tricompartment osteoarthritis of knee (Left) 03/24/2021   Baker cyst (Left) 03/24/2021   Chronic low back pain (1ry area of Pain) (Bilateral) (R>L) w/o sciatica 03/24/2021   Lumbar facet syndrome (Bilateral) 03/24/2021   Chronic lower extremity pain (2ry area of Pain) (Bilateral) (L>R) 03/24/2021   Lumbosacral radiculitis/sensory radiculopathy at L2 (Bilateral) 03/24/2021   Lumbosacral radiculitis/sensory radiculopathy at L3 (Bilateral) 03/24/2021   Chronic pain syndrome 03/23/2021   Pharmacologic therapy 03/23/2021   Disorder of skeletal system 03/23/2021   Problems influencing health status 03/23/2021   PAD (peripheral artery disease) ([place]) 10/01/2020   GAD (generalized anxiety disorder) 01/13/2018   MDD (major depressive disorder) 93/71/6967   Umbilical hernia 89/38/1017   Chronic knee pain (Left) 08/11/2017   Derangement of medial meniscus, posterior horn (Left) 08/11/2017   Vitamin D insufficiency 03/05/2016   BPH without obstruction/lower urinary tract symptoms 07/08/2015   Chronic fatigue 10/23/2014   Parkinson's disease ([place]) 09/20/2014   Major depression, recurrent, full remission ([place]) 07/26/2013   Unsteady gait 07/26/2013   Orthostatic hypotension 07/26/2013   Depression 07/26/2013   Anxiety 06/23/2013   Chronic anxiety 06/23/2013   Tremor 05/18/2013   Bilateral carotid artery stenosis 08/30/2012    ONSET DATE: ***  REFERRING DIAG: ***  THERAPY DIAG:  No diagnosis found.  Rationale for Evaluation and Treatment {HABREHAB:27488}  SUBJECTIVE:   SUBJECTIVE STATEMENT: *** Pt accompanied by: {accompnied:27141}  PERTINENT HISTORY: ***  PRECAUTIONS: {Therapy precautions:24002}  WEIGHT BEARING RESTRICTIONS {Yes ***/  No:24003}  PAIN:  Are you having pain? {OPRCPAIN:27236}  FALLS: Has patient fallen in last 6 months?  {fallsyesno:27318}  LIVING ENVIRONMENT: Lives with: {OPRC lives with:25569::"lives with their family"} Lives in: {Lives in:25570} Stairs: {opstairs:27293} Has following equipment at home: {Assistive devices:23999}  PLOF: {PLOF:24004}  PATIENT GOALS ***  OBJECTIVE:   HAND DOMINANCE: Right  ADLs: Overall ADLs: *** Transfers/ambulation related to ADLs: modified I with transfer tub bench.  Eating: able to cut food, with extra time; has built up utensils but does not yet have to use them Grooming: Copy,  UB Dressing: occasional assist with buttons on shirt  LB Dressing: occasional assist with buttons on pants, extra time  (20-30 min Toileting: *** Bathing: *** Tub Shower transfers: *** Equipment: {equipment:25573}   IADLs: Shopping: *** Light housekeeping: *** Meal Prep: *** Community mobility: *** Medication management: *** Financial management: pt manages finances independently Handwriting: {OTWRITTENEXPRESSION:25361}  MOBILITY STATUS: {OTMOBILITY:25360}  POSTURE COMMENTS:  {posture:25561}  ACTIVITY TOLERANCE: Activity tolerance: ***  FUNCTIONAL OUTCOME MEASURES: {PDoutcomemeasures:27287}  COORDINATION: {otcoordination:27237}  UE ROM:  {PDMEASUREMENT:27288}  UE MMT:   {PDMEASUREMENT:27288}  SENSATION: {sensation:27233}    MUSCLE TONE: {UETONE:25567}  COGNITION: Overall cognitive status: {cognition:24006}   OBSERVATIONS: {PDobservations:27291}   TODAY'S TREATMENT:  ***   PATIENT EDUCATION: Education details: *** Person educated: {Person educated:25204} Education method: {Education Method:25205} Education comprehension: {Education Comprehension:25206}   HOME EXERCISE PROGRAM: ***  ASSESSMENT:  CLINICAL IMPRESSION: Patient is a *** y.o. *** who was seen today for occupational therapy evaluation for ***.   PERFORMANCE DEFICITS in functional skills including {OT physical skills:25468}, cognitive skills including {OT cognitive  skills:25469}, and psychosocial skills including {OT psychosocial skills:25470}.   IMPAIRMENTS are limiting patient from {OT performance deficits:25471}.   COMORBIDITIES {Comorbidities:25485} that affects occupational performance. Patient will benefit from skilled OT to address above impairments and improve overall function.  MODIFICATION OR ASSISTANCE TO COMPLETE EVALUATION: {OT modification:25474}  OT OCCUPATIONAL PROFILE AND HISTORY: {OT PROFILE AND HISTORY:25484}  CLINICAL DECISION MAKING: {OT CDM:25475}  REHAB POTENTIAL: {rehabpotential:25112}  EVALUATION COMPLEXITY: {Evaluation complexity:25115}     GOALS: Goals reviewed with patient? {yes/no:20286}  SHORT TERM GOALS: Target date: {follow up:25551}  (Remove Blue Hyperlink)  *** Baseline: Goal status: {GOALSTATUS:25110}  2.  *** Baseline:  Goal status: {GOALSTATUS:25110}  3.  *** Baseline:  Goal status: {GOALSTATUS:25110}  4.  *** Baseline:  Goal status: {GOALSTATUS:25110}  5.  *** Baseline:  Goal status: {GOALSTATUS:25110}  6.  *** Baseline:  Goal status: {GOALSTATUS:25110}  LONG TERM GOALS: Target date: {follow up:25551}  (Remove Blue Hyperlink)  *** Baseline:  Goal status: {GOALSTATUS:25110}  2.  *** Baseline:  Goal status: {GOALSTATUS:25110}  3.  *** Baseline:  Goal status: {GOALSTATUS:25110}  4.  *** Baseline:  Goal status: {GOALSTATUS:25110}  5.  *** Baseline:  Goal status: {GOALSTATUS:25110}  6.  *** Baseline:  Goal status: {GOALSTATUS:25110}  PLAN: OT FREQUENCY: {rehab frequency:25116}  OT DURATION: {rehab duration:25117}  PLANNED INTERVENTIONS: {OT Interventions:25467}  RECOMMENDED OTHER SERVICES: ***  CONSULTED AND AGREED WITH PLAN OF CARE: {LOV:56433}  PLAN FOR NEXT SESSION: ***   Darleene Cleaver, OT 08/19/2021, 4:14 PM

## 2021-08-20 ENCOUNTER — Encounter: Payer: Self-pay | Admitting: Speech Pathology

## 2021-08-20 NOTE — Therapy (Unsigned)
OUTPATIENT SPEECH LANGUAGE PATHOLOGY PARKINSON'S EVALUATION   Patient Name: Joseph Arroyo MRN: [id number] DOB:Jul 31, 1940, 81 y.o., male Today's Date: 08/20/2021  PCP: Dr Nobie Putnam REFERRING PROVIDER: Dr Lavena Bullion   End of Session - 08/20/21 2307     Visit Number 1    Number of Visits 25    Date for SLP Re-Evaluation 11/12/21    Authorization Type [employer]/HMO PPO    Progress Note Due on Visit 10    SLP Start Time 1500    SLP Stop Time  1600    SLP Time Calculation (min) 60 min    Activity Tolerance Patient tolerated treatment well             Past Medical History:  Diagnosis Date   Allergic rhinitis due to pollen 11/21/2007   Brachial neuritis or radiculitis NOS    Cervicalgia    Concussion    age 36 - s/p accident   Costal chondritis    Depression    Essential and other specified forms of tremor    GERD (gastroesophageal reflux disease)    in past   Lumbago    Occlusion and stenosis of carotid artery without mention of cerebral infarction    Seizures [facility])    age 61 - after concussion   Past Surgical History:  Procedure Laterality Date   CATARACT EXTRACTION Right 07/18/14   CATARACT EXTRACTION W/PHACO Left 08/01/2014   Procedure: CATARACT EXTRACTION PHACO AND INTRAOCULAR LENS PLACEMENT ([place]);  Surgeon: Leandrew Koyanagi, MD;  Location: [place];  Service: Ophthalmology;  Laterality: Left;  IVA TOPICAL   COLONOSCOPY  2008   OTHER SURGICAL HISTORY  2002   ear surgery   STAPEDES SURGERY Right 2002   Patient Active Problem List   Diagnosis Date Noted   Cervicalgia 08/05/2021   Spondylosis without myelopathy or radiculopathy, lumbosacral region 06/24/2021   Lumbosacral facet hypertrophy 06/03/2021   Lumbar central spinal stenosis, w/o neurogenic claudication (L4-5) 06/03/2021   Lumbar lateral recess stenosis (Bilateral: L2-3, L4-5) (Right: L3-4) 06/03/2021   Lumbosacral foraminal stenosis (Bilateral: L2-3, L3-4) (Left:  L5-S1) 06/03/2021   Lumbar nerve root impingement (Right: L3 at L2-3 & L3-4) 06/03/2021   Ligamentum flavum hypertrophy (L3-4, L4-5) 06/03/2021   Abnormal MRI, lumbar spine (04/23/2021) 04/24/2021   Long term prescription benzodiazepine use (alprazolam) (Xanax) 03/24/2021   Grade 1 Retrolisthesis of L2/L3 (5 mm) and L3/L4 (3 mm) 03/24/2021   Levoscoliosis of lumbar spine (L3-4 apex) 03/24/2021   DDD (degenerative disc disease), lumbosacral 03/24/2021   Lumbosacral facet arthropathy (Left: L3-4, L4-5, and L5-S1) 03/24/2021   Tricompartment osteoarthritis of knee (Left) 03/24/2021   Baker cyst (Left) 03/24/2021   Chronic low back pain (1ry area of Pain) (Bilateral) (R>L) w/o sciatica 03/24/2021   Lumbar facet syndrome (Bilateral) 03/24/2021   Chronic lower extremity pain (2ry area of Pain) (Bilateral) (L>R) 03/24/2021   Lumbosacral radiculitis/sensory radiculopathy at L2 (Bilateral) 03/24/2021   Lumbosacral radiculitis/sensory radiculopathy at L3 (Bilateral) 03/24/2021   Chronic pain syndrome 03/23/2021   Pharmacologic therapy 03/23/2021   Disorder of skeletal system 03/23/2021   Problems influencing health status 03/23/2021   PAD (peripheral artery disease) ([place]) 10/01/2020   GAD (generalized anxiety disorder) 01/13/2018   MDD (major depressive disorder) 46/27/0350   Umbilical hernia 09/38/1829   Chronic knee pain (Left) 08/11/2017   Derangement of medial meniscus, posterior horn (Left) 08/11/2017   Vitamin D insufficiency 03/05/2016   BPH without obstruction/lower urinary tract symptoms 07/08/2015   Chronic fatigue 10/23/2014  Parkinson's disease ([place]) 09/20/2014   Major depression, recurrent, full remission ([place]) 07/26/2013   Unsteady gait 07/26/2013   Orthostatic hypotension 07/26/2013   Depression 07/26/2013   Anxiety 06/23/2013   Chronic anxiety 06/23/2013   Tremor 05/18/2013   Bilateral carotid artery stenosis 08/30/2012    ONSET DATE: 05/28/2015; referral date  08/11/2021  REFERRING DIAG: G20 (ICD-10-CM) - Parkinson's disease  THERAPY DIAG:  Dysphagia, oropharyngeal phase  Dysarthria and anarthria  Dysphonia  Rationale for Evaluation and Treatment Rehabilitation  SUBJECTIVE:   SUBJECTIVE/ STATEMENT: Pt pleasant, reports previous ST services; 09/09/2015 (MBSS); 09/12/2015 thru 10/08/2015; 06/11/2016; 01/26/2017; 10/28/2017; 08/23/2018  Thru Pt accompanied by: significant other  PERTINENT HISTORY: Moderate bilateral Parkinsonism with FOG, anxiety, Scoliosis, Severe and chronic back pain  PAIN:  Are you having pain? Yes: NPRS scale: 2/10 Pain location: back Pain description: lower; right; left Aggravating factors: sitting Relieving factors: under care of pain doctor; receiving epidurals  FALLS: Has patient fallen in last 6 months?  No  LIVING ENVIRONMENT: Lives with: lives with their spouse Lives in: House/apartment  PLOF:  Level of assistance: Needed assistance with ADLs, Needed assistance with IADLS Employment: Retired  PATIENT GOALS To be able to change the oil in his car; drive his motorcycle again  OBJECTIVE:   DIAGNOSTIC FINDINGS: MRI 12/21/2013 - no acute intracranial abnormalities; mild generalized cerebral atrophy  COGNITION: Overall cognitive status: Within functional limits for tasks assessed  MOTOR SPEECH: Overall motor speech: impaired Level of impairment: Word Respiration: clavicular breathing and speaking on residual capacity Phonation: breathy, hoarse, and gravelly  ; wet Resonance: hyponasality Articulation: Impaired: word Intelligibility: Intelligibility reduced Motor planning: Appears intact Motor speech errors: aware and consistent Interfering components: anatomical limitations Effective technique: slow rate and increased vocal intensity  ORAL MOTOR EXAMINATION Overall status: WFL Comments: pt with open mouth posture at rest likely contributing to report of dry mouth; top resting on palate in  posterior position during open mouth posture   OBJECTIVE ASSESSMENT:  Measured when a sound level meter was placed 30 cm away from pt's mouth, 30 minutes of complex conversational speech was reduced today, at average 60dB (WNL= average 70-72dB). Overall speech intelligibility for this listener in a quiet environment was affected, at approximately 75%. Production of loud /a/ averaged 83dB (range of 82 to 85) and consistent max cues needed for loudness.   Pt then rated effort level at 8/10 for production of loud /a/ (10=maximal effort). In reciting Bible verses, pt was asked to use the same amount of effort as with loud /a/. Loudness average with this increased effort was 76dB with consistent mod cues for loudness. Pt would benefit from skilled ST in order to improve speech intelligibility and pt's QOL.  Pt does/does not report difficulty with swallowing warranting further evaluation    PATIENT REPORTED OUTCOME MEASURES (PROM): VHI: Moderate     PATIENT EDUCATION: Education details: results of this assessment, ST POC, current pt goals are not functional expectation as a result of therapy Person educated: Patient and Spouse Education method: Explanation, Demonstration, and Verbal cues Education comprehension: verbalized understanding and needs further education       GOALS: Goals reviewed with patient? Yes  SHORT TERM GOALS: Target date: 10 therapy sessions  The patient produce "ah", High/Lows, and Functional Phrases at average loudness >/= 80 dB and with loud, good quality voice with moderate cues.             Baseline: maximal cues Goal status: INITIAL  2.  The patient will  complete reading drills (words/phrases, sentences) at average >/= 75 dB and with loud, good quality voice with moderate cues.  Baseline: maximal cues Goal status: INITIAL  3.   The patient will participate in 5-8 minutes conversation, maintaining average loudness of 75 dB and loud, good quality voice with  min cues.  Baseline: maximal cues Goal status: INITIAL  4.  The patient will complete an instrumental evaluation (MBSS) within 3 weeks in order to evaluate swallowing safety. Baseline: last MBSS 2017 Goal status: INITIAL   LONG TERM GOALS: Target date: 11/12/2021    The patient will participate in 15-20 minutes conversation, maintaining average loudness of 75 dB and loud, good quality voice. Baseline: 60dB Goal status: INITIAL  2.   Patient will report improved communication effectiveness as measured by Communicative Effectiveness Survey.   Baseline:  Goal status: INITIAL  3.  Pt will consume least restrictive diet with no s/s of respiratory decline.  Baseline: new goal Goal status: INITIAL   ASSESSMENT:  CLINICAL IMPRESSION: Patient is a 81 y.o. male who was seen today for voice and clinical swallow evaluation.   OBJECTIVE IMPAIRMENTS  Objective impairments include voice disorder and dysphagia. These impairments are limiting patient from effectively communicating at home and in community and safety when swallowing.Factors affecting potential to achieve goals and functional outcome are co-morbidities, medical prognosis, pain level, and severity of impairments.. Patient will benefit from skilled SLP services to address above impairments and improve overall function.  REHAB POTENTIAL: Fair multiple previous ST sessions; progressive severe chronic back pain  PLAN: SLP FREQUENCY: 2x/week  SLP DURATION: 12 weeks  PLANNED INTERVENTIONS: Aspiration precaution training, Pharyngeal strengthening exercises, Diet toleration management , Trials of upgraded texture/liquids, SLP instruction and feedback, and Compensatory strategies   Jawuan Robb B. Rutherford Nail M.S., CCC-SLP, [place] Pathologist Certified Brain Injury [place]  [facility] Office [phone] Ascom [phone] Fax [phone]  Stormy Fabian,  [place] 08/20/2021, 11:10 PM

## 2021-08-20 NOTE — Therapy (Addendum)
OUTPATIENT OCCUPATIONAL THERAPY NEURO EVALUATION  Patient Name: Joseph Arroyo MRN: [id number] DOB:1940-06-29, 81 y.o., male Today's Date: 08/20/2021  PCP: Dr. Nobie Putnam REFERRING PROVIDER: Dr. Lavena Bullion   OT End of Session - 08/20/21 1652     Visit Number 1    Number of Visits 24    Date for OT Re-Evaluation 11/11/21    Authorization Time Period Reporting period beginning 08/19/21    OT Start Time 1600    OT Stop Time 1655    OT Time Calculation (min) 55 min    Equipment Utilized During Treatment RW    Activity Tolerance Patient tolerated treatment well    Behavior During Therapy [facility] for tasks assessed/performed             Past Medical History:  Diagnosis Date   Allergic rhinitis due to pollen 11/21/2007   Brachial neuritis or radiculitis NOS    Cervicalgia    Concussion    age 80 - s/p accident   Costal chondritis    Depression    Essential and other specified forms of tremor    GERD (gastroesophageal reflux disease)    in past   Lumbago    Occlusion and stenosis of carotid artery without mention of cerebral infarction    Seizures [facility])    age 59 - after concussion   Past Surgical History:  Procedure Laterality Date   CATARACT EXTRACTION Right 07/18/14   CATARACT EXTRACTION W/PHACO Left 08/01/2014   Procedure: CATARACT EXTRACTION PHACO AND INTRAOCULAR LENS PLACEMENT ([place]);  Surgeon: Leandrew Koyanagi, MD;  Location: [place];  Service: Ophthalmology;  Laterality: Left;  IVA TOPICAL   COLONOSCOPY  2008   OTHER SURGICAL HISTORY  2002   ear surgery   STAPEDES SURGERY Right 2002   Patient Active Problem List   Diagnosis Date Noted   Cervicalgia 08/05/2021   Spondylosis without myelopathy or radiculopathy, lumbosacral region 06/24/2021   Lumbosacral facet hypertrophy 06/03/2021   Lumbar central spinal stenosis, w/o neurogenic claudication (L4-5) 06/03/2021   Lumbar lateral recess stenosis (Bilateral: L2-3, L4-5) (Right: L3-4)  06/03/2021   Lumbosacral foraminal stenosis (Bilateral: L2-3, L3-4) (Left: L5-S1) 06/03/2021   Lumbar nerve root impingement (Right: L3 at L2-3 & L3-4) 06/03/2021   Ligamentum flavum hypertrophy (L3-4, L4-5) 06/03/2021   Abnormal MRI, lumbar spine (04/23/2021) 04/24/2021   Long term prescription benzodiazepine use (alprazolam) (Xanax) 03/24/2021   Grade 1 Retrolisthesis of L2/L3 (5 mm) and L3/L4 (3 mm) 03/24/2021   Levoscoliosis of lumbar spine (L3-4 apex) 03/24/2021   DDD (degenerative disc disease), lumbosacral 03/24/2021   Lumbosacral facet arthropathy (Left: L3-4, L4-5, and L5-S1) 03/24/2021   Tricompartment osteoarthritis of knee (Left) 03/24/2021   Baker cyst (Left) 03/24/2021   Chronic low back pain (1ry area of Pain) (Bilateral) (R>L) w/o sciatica 03/24/2021   Lumbar facet syndrome (Bilateral) 03/24/2021   Chronic lower extremity pain (2ry area of Pain) (Bilateral) (L>R) 03/24/2021   Lumbosacral radiculitis/sensory radiculopathy at L2 (Bilateral) 03/24/2021   Lumbosacral radiculitis/sensory radiculopathy at L3 (Bilateral) 03/24/2021   Chronic pain syndrome 03/23/2021   Pharmacologic therapy 03/23/2021   Disorder of skeletal system 03/23/2021   Problems influencing health status 03/23/2021   PAD (peripheral artery disease) ([place]) 10/01/2020   GAD (generalized anxiety disorder) 01/13/2018   MDD (major depressive disorder) 36/14/4315   Umbilical hernia 40/10/6759   Chronic knee pain (Left) 08/11/2017   Derangement of medial meniscus, posterior horn (Left) 08/11/2017   Vitamin D insufficiency 03/05/2016   BPH without obstruction/lower urinary tract  symptoms 07/08/2015   Chronic fatigue 10/23/2014   Parkinson's disease ([place]) 09/20/2014   Major depression, recurrent, full remission ([place]) 07/26/2013   Unsteady gait 07/26/2013   Orthostatic hypotension 07/26/2013   Depression 07/26/2013   Anxiety 06/23/2013   Chronic anxiety 06/23/2013   Tremor 05/18/2013   Bilateral carotid  artery stenosis 08/30/2012    ONSET DATE: 08/11/21 date of referral.  PD symptoms since 2015.  REFERRING DIAG: Parkinson's Disease  THERAPY DIAG:  Muscle weakness (generalized)  Other lack of coordination  Unsteadiness on feet  Rationale for Evaluation and Treatment Rehabilitation  SUBJECTIVE:   SUBJECTIVE STATEMENT: "I'm having a harder time texting and I like to be able to text my family and friends [place]." Pt accompanied by: Burnard Bunting  PERTINENT HISTORY: Per chart, pt with hx of Parkinson's disease ([place]); Chronic knee pain (Left); Derangement of medial meniscus, posterior horn (Left); PAD (peripheral artery disease) ([place]); Chronic pain syndrome; DDD (degenerative disc disease) lumbosacral; osteoarthritis of knee (Left); Chronic low back pain, Lumbar facet syndrome (Bilateral); Chronic lower extremity pain. PRECAUTIONS: Fall  WEIGHT BEARING RESTRICTIONS No  PAIN:  Are you having pain? Yes: NPRS scale: 3/10 Pain location: low back Pain description: achy Aggravating factors: N/A, constant Relieving factors: rest, repositioning  FALLS: Has patient fallen in last 6 months? Yes. Number of falls 1-2  LIVING ENVIRONMENT: Lives with: spouse  Lives in: 1 level home  Stairs: 4 steps, a landing, then 3 more steps with bilat rails.  Spouse reports church will likely be building them a ramp Has following equipment at home: Gilford Rile - 2 wheeled, Shower bench, bed side commode, and Grab bars  PLOF: Needs assistance with ADLs.  Pt performs basic self care with set up assist, extra time to manage clothing fasteners.  Pt can take his own shower and dress self, though efficiency is lacking; extra time needed.  Pt/spouse go to a local pool several times per week, and doffing a wet suit then dressing after can take up to 30 min.    PATIENT GOALS : Increase efficiency with daily tasks, increase FMC skills   OBJECTIVE:  HAND DOMINANCE: Right  ADLs: Overall ADLs: performs basic self  care with set up, extra time, occasional assist with small clothing fasteners.   Transfers/ambulation related to ADLs: supv-modified indep using RW. Eating: difficulty cutting food Grooming: modified indep, including shaving with electric razor and straight razor. UB Dressing: extra time to manage clothing fasteners, occasional assist with smaller buttons LB Dressing: extra time to don shoes Toileting: modified indep; 3in1 over toilet Bathing: modified indep, extra time.   Tub Shower transfers: transfer tub bench/modified indep. Equipment: Transfer tub bench, Grab bars, and bed side commode, RW   IADLs: Shopping: Spouse manages at baseline Light housekeeping: spouse manages at baseline Meal Prep: Spouse manages at [employer] mobility: RW, transport chair Medication management: modified Chief Executive Officer: pt requires extra time for writing checks, but pt manages finances independently. Handwriting: 100% legible and Increased time at time of eval, though pt admits this can fluctuate when fatigued.  MOBILITY STATUS: Hx of falls  POSTURE COMMENTS:  rounded shoulders, increased thoracic kyphosis, and hx of scoliosis , L shoulder rests significantly lower than R Sitting balance: Supports self with one extremity  ACTIVITY TOLERANCE: Activity tolerance: Pt reports dressing/bathing tasks cause fatigue, requiring extra time to complete.  Occasional need for transport chair for community mobility.   FUNCTIONAL OUTCOME MEASURES: FOTO: 60  UPPER EXTREMITY ROM     Active ROM Right  eval Left eval  Shoulder flexion 135 150  Shoulder abduction 180 180  Shoulder adduction    Shoulder extension    Shoulder internal rotation    Shoulder external rotation WNL WNL  Elbow flexion    Elbow extension    Wrist flexion    Wrist extension    Wrist ulnar deviation    Wrist radial deviation    Wrist pronation    Wrist supination    (Blank rows = not tested)   UPPER EXTREMITY  MMT:     MMT Right eval Left eval  Shoulder flexion 5/5 5/5  Shoulder abduction 5/5 5/5  Shoulder adduction    Shoulder extension    Shoulder internal rotation    Shoulder external rotation    Middle trapezius    Lower trapezius    Elbow flexion 5/5 5/5  Elbow extension 5/5 5/5  Wrist flexion 5/5 5/5  Wrist extension 5/5 5/5  Wrist ulnar deviation    Wrist radial deviation    Wrist pronation    Wrist supination    (Blank rows = not tested)  HAND FUNCTION: Grip strength: Right: 50 lbs; Left: 40 lbs, Lateral pinch: Right: 14 lbs, Left: 8 lbs, and 3 point pinch: Right: 11 lbs, Left: 9 lbs  COORDINATION: 9 Hole Peg test: Right: 34 sec; Left: 35 sec Tremors: Resting, action, Right, and Left; more pronounced on the L; mild at time of eval.   Pt takes Parkinson's meds 3x daily, 7a, 12p, 5 p  SENSATION: Not tested    MUSCLE TONE: RUE: Within functional limits and LUE: Within functional limits  COGNITION: Overall cognitive status: Pt appears to be a good historian, alert and oriented x4.  Seems to lack some awareness into deficits/safety as noted by some reported unrealistic functional goals.   VISION: Subjective report: No changes from baseline.  Baseline vision: Wears glasses for reading only Visual history: macular degeneration, hx of cataract removal both eyes; no report of diplopia   OBSERVATIONS: Pt presents with mild BUE coordination impairment, mild dyskinesia with resting hand tremor more pronounced on the L, mild also with activity.    TODAY'S TREATMENT:  OT evaluation completed.  Pt is a 81 y/o male, present today with spouse to address functional decline related to PD.  Pt presents with mild-moderate FMC/GMC impairment which is limiting daily tasks, including texting, check writing, and efficiency with dressing tasks.     PATIENT EDUCATION: Education details: Role of OT, goals, poc Person educated: Patient and Spouse Education method: Explanation and Verbal  cues Education comprehension: verbalized understanding   HOME EXERCISE PROGRAM: Not yet initiated    GOALS: Goals reviewed with patient? Yes  SHORT TERM GOALS: Target date: 09/30/21  Pt will be indep to perform HEP for increasing strength and coordination in B hands.  Baseline: HEP not yet initiated Goal status: INITIAL  2.  Pt will be indep to verbalize 3 fall prevention strategies to implement during ADLs.  Baseline: Not yet initiated Goal status: INITIAL    3 LONG TERM GOALS: 11/11/21  Pt will increase FOTO score by 3 or more points to indicate increased perceived functional performance with daily tasks.  Baseline: FOTO 60 Goal status: INITIAL  2.  Pt will be able to efficiently send short text messages to family/friends with modified techniques as needed (ie: use of stylus, voice to text) Baseline: Spouse sends texts for pt Goal status: INITIAL  3.  Pt will fill out a check for bill paying with 90% legibility  with adapted pen as needed. Baseline: Pt reports inconsistent legibility Goal status: INITIAL  4.  Pt will increase efficiency with dressing tasks, donning clothes in 15 min or less. Baseline: Spouse reports it can take pt 20-30 min to dress in the morning Goal status: INITIAL    ASSESSMENT:  CLINICAL IMPRESSION: Patient is a 81 y.o. male who was seen today for occupational therapy evaluation for deficits related to PD.  Pt presents with mild BUE coordination impairment, mild dyskinesia with resting hand tremor more pronounced on the L, mild also with activity.   PERFORMANCE DEFICITS in functional skills including ADLs, IADLs, coordination, dexterity, ROM, strength, pain, flexibility, FMC, and GMC, cognitive skills including safety awareness.  IMPAIRMENTS are limiting patient from ADLs, IADLs, and leisure.   COMORBIDITIES has co-morbidities such as scoliosis, chronic back pain and leg pain  that affects occupational performance. Patient will benefit from  skilled OT to address above impairments and improve overall function.  MODIFICATION OR ASSISTANCE TO COMPLETE EVALUATION: No modification of tasks or assist necessary to complete an evaluation.  OT OCCUPATIONAL PROFILE AND HISTORY: Problem focused assessment: Including review of records relating to presenting problem.  CLINICAL DECISION MAKING: Moderate - several treatment options, min-mod task modification necessary  REHAB POTENTIAL: Good  EVALUATION COMPLEXITY: Moderate    PLAN: OT FREQUENCY: 2x/week  OT DURATION: 12 weeks  PLANNED INTERVENTIONS: self care/ADL training, therapeutic exercise, therapeutic activity, neuromuscular re-education, manual therapy, passive range of motion, balance training, functional mobility training, moist heat, cryotherapy, patient/family education, cognitive remediation/compensation, energy conservation, coping strategies training, and DME and/or AE instructions  RECOMMENDED OTHER SERVICES: N/A  CONSULTED AND AGREED WITH PLAN OF CARE: Patient and family member/caregiver  PLAN FOR NEXT SESSION: See above  Leta Speller, MS, OTR/L  Darleene Cleaver, OT 08/20/2021, 5:00 PM

## 2021-08-21 ENCOUNTER — Ambulatory Visit: Payer: Medicare HMO

## 2021-08-21 VITALS — BP 96/72 | HR 59 | Ht 66.0 in | Wt 126.0 lb

## 2021-08-21 DIAGNOSIS — R278 Other lack of coordination: Secondary | ICD-10-CM | POA: Diagnosis not present

## 2021-08-21 DIAGNOSIS — R2689 Other abnormalities of gait and mobility: Secondary | ICD-10-CM | POA: Diagnosis not present

## 2021-08-21 DIAGNOSIS — M6281 Muscle weakness (generalized): Secondary | ICD-10-CM

## 2021-08-21 DIAGNOSIS — R269 Unspecified abnormalities of gait and mobility: Secondary | ICD-10-CM

## 2021-08-21 DIAGNOSIS — G20 Parkinson's disease: Secondary | ICD-10-CM | POA: Diagnosis not present

## 2021-08-21 DIAGNOSIS — R490 Dysphonia: Secondary | ICD-10-CM | POA: Diagnosis not present

## 2021-08-21 DIAGNOSIS — R1312 Dysphagia, oropharyngeal phase: Secondary | ICD-10-CM | POA: Diagnosis not present

## 2021-08-21 DIAGNOSIS — R2681 Unsteadiness on feet: Secondary | ICD-10-CM

## 2021-08-21 DIAGNOSIS — R471 Dysarthria and anarthria: Secondary | ICD-10-CM | POA: Diagnosis not present

## 2021-08-21 DIAGNOSIS — R262 Difficulty in walking, not elsewhere classified: Secondary | ICD-10-CM

## 2021-08-21 NOTE — Therapy (Signed)
OUTPATIENT PHYSICAL THERAPY NEURO EVALUATION   Patient Name: Joseph Arroyo MRN: [id number] DOB:[birth year], 81 y.o., male Today's Date: 08/21/2021   PCP: Olin Hauser, DO REFERRING PROVIDER: Lavena Bullion, NP   PT End of Session - 08/21/21 1332     Visit Number 1    Number of Visits 25    Date for PT Re-Evaluation 11/13/21    Authorization Time Period Initial Cert 7/51/0258- 07/27/7822    Progress Note Due on Visit 10    PT Start Time 2353    PT Stop Time 1245    PT Time Calculation (min) 60 min    Equipment Utilized During Treatment Gait belt    Activity Tolerance Patient tolerated treatment well    Behavior During Therapy [facility] for tasks assessed/performed             Past Medical History:  Diagnosis Date   Allergic rhinitis due to pollen 11/21/2007   Brachial neuritis or radiculitis NOS    Cervicalgia    Concussion    age 71 - s/p accident   Costal chondritis    Depression    Essential and other specified forms of tremor    GERD (gastroesophageal reflux disease)    in past   Lumbago    Occlusion and stenosis of carotid artery without mention of cerebral infarction    Seizures [facility])    age 40 - after concussion   Past Surgical History:  Procedure Laterality Date   CATARACT EXTRACTION Right 07/18/14   CATARACT EXTRACTION W/PHACO Left 08/01/2014   Procedure: CATARACT EXTRACTION PHACO AND INTRAOCULAR LENS PLACEMENT ([place]);  Surgeon: Leandrew Koyanagi, MD;  Location: [place];  Service: Ophthalmology;  Laterality: Left;  IVA TOPICAL   COLONOSCOPY  2008   OTHER SURGICAL HISTORY  2002   ear surgery   STAPEDES SURGERY Right 2002   Patient Active Problem List   Diagnosis Date Noted   Cervicalgia 08/05/2021   Spondylosis without myelopathy or radiculopathy, lumbosacral region 06/24/2021   Lumbosacral facet hypertrophy 06/03/2021   Lumbar central spinal stenosis, w/o neurogenic claudication (L4-5) 06/03/2021   Lumbar lateral recess  stenosis (Bilateral: L2-3, L4-5) (Right: L3-4) 06/03/2021   Lumbosacral foraminal stenosis (Bilateral: L2-3, L3-4) (Left: L5-S1) 06/03/2021   Lumbar nerve root impingement (Right: L3 at L2-3 & L3-4) 06/03/2021   Ligamentum flavum hypertrophy (L3-4, L4-5) 06/03/2021   Abnormal MRI, lumbar spine (04/23/2021) 04/24/2021   Long term prescription benzodiazepine use (alprazolam) (Xanax) 03/24/2021   Grade 1 Retrolisthesis of L2/L3 (5 mm) and L3/L4 (3 mm) 03/24/2021   Levoscoliosis of lumbar spine (L3-4 apex) 03/24/2021   DDD (degenerative disc disease), lumbosacral 03/24/2021   Lumbosacral facet arthropathy (Left: L3-4, L4-5, and L5-S1) 03/24/2021   Tricompartment osteoarthritis of knee (Left) 03/24/2021   Baker cyst (Left) 03/24/2021   Chronic low back pain (1ry area of Pain) (Bilateral) (R>L) w/o sciatica 03/24/2021   Lumbar facet syndrome (Bilateral) 03/24/2021   Chronic lower extremity pain (2ry area of Pain) (Bilateral) (L>R) 03/24/2021   Lumbosacral radiculitis/sensory radiculopathy at L2 (Bilateral) 03/24/2021   Lumbosacral radiculitis/sensory radiculopathy at L3 (Bilateral) 03/24/2021   Chronic pain syndrome 03/23/2021   Pharmacologic therapy 03/23/2021   Disorder of skeletal system 03/23/2021   Problems influencing health status 03/23/2021   PAD (peripheral artery disease) ([place]) 10/01/2020   GAD (generalized anxiety disorder) 01/13/2018   MDD (major depressive disorder) 61/44/3154   Umbilical hernia 00/86/7619   Chronic knee pain (Left) 08/11/2017   Derangement of medial meniscus, posterior horn (Left) 08/11/2017  Vitamin D insufficiency 03/05/2016   BPH without obstruction/lower urinary tract symptoms 07/08/2015   Chronic fatigue 10/23/2014   Parkinson's disease ([place]) 09/20/2014   Major depression, recurrent, full remission ([place]) 07/26/2013   Unsteady gait 07/26/2013   Orthostatic hypotension 07/26/2013   Depression 07/26/2013   Anxiety 06/23/2013   Chronic anxiety 06/23/2013    Tremor 05/18/2013   Bilateral carotid artery stenosis 08/30/2012    ONSET DATE: 08/11/2021  REFERRING DIAG: Parkinsons Disease  THERAPY DIAG:  Difficulty in walking, not elsewhere classified  Abnormality of gait and mobility  Muscle weakness (generalized)  Other abnormalities of gait and mobility  Other lack of coordination  Unsteadiness on feet  Rationale for Evaluation and Treatment Rehabilitation  SUBJECTIVE:                                                                                                                                                                                              SUBJECTIVE STATEMENT: Patient reports he is here to improve his symptoms associated with diagnosis of Parkinsons. He reports difficulty with walking- using RW currently with 2 recent falls and would like to have better balance and walk better.  Pt accompanied by: significant other  PERTINENT HISTORY: Per chart history provided by Dr. Consuela Mimes- Mr. Sedor has Parkinson's disease ([place]); Chronic knee pain (Left); Derangement of medial meniscus, posterior horn (Left); PAD (peripheral artery disease) ([place]); Chronic pain syndrome; Grade 1 Retrolisthesis of L2/L3 (5 mm) and L3/L4 (3 mm); Levoscoliosis of lumbar spine (L3-4 apex); DDD (degenerative disc disease), lumbosacral; Lumbosacral facet arthropathy (Left: L3-4, L4-5, and L5-S1); Tricompartment osteoarthritis of knee (Left); Baker cyst (Left); Chronic low back pain (1ry area of Pain) (Bilateral) (R>L) w/o sciatica; Lumbar facet syndrome (Bilateral); Chronic lower extremity pain (2ry area of Pain) (Bilateral) (L>R); Lumbosacral radiculitis/sensory radiculopathy at L2 (Bilateral); Lumbosacral radiculitis/sensory radiculopathy at L3 (Bilateral); Abnormal MRI, lumbar spine (04/23/2021); Lumbosacral facet hypertrophy; Lumbar central spinal stenosis, w/o neurogenic claudication (L4-5); Lumbar lateral recess stenosis (Bilateral: L2-3, L4-5) (Right:  L3-4); Lumbosacral foraminal stenosis (Bilateral: L2-3, L3-4) (Left: L5-S1); Lumbar nerve root impingement (Right: L3 at L2-3 & L3-4); Ligamentum flavum hypertrophy (L3-4, L4-5); Spondylosis without myelopathy or radiculopathy, lumbosacral region; and Cervicalgia on their pertinent problem list   Patient has appointment for Epidural to low back next Tues 08/26/2021  PAIN:  Are you having pain? Yes: NPRS scale: 3/10 Pain location: low back Pain description: ache/soreness Aggravating factors: prolonged standing, transfers, walking Relieving factors: rest and medications  PRECAUTIONS: Fall  WEIGHT BEARING RESTRICTIONS No  FALLS: Has patient fallen in last 6 months? Yes. Number of falls 2  LIVING ENVIRONMENT: Lives with:  lives with their spouse Lives in: House/apartment Stairs: 4 Has following equipment at home: [place] - 2 wheeled  PLOF: Needs assistance with ADLs- intermittent  PATIENT GOALS  to walk better and be as active as possible. Also to get in/out of cars better.   OBJECTIVE:   DIAGNOSTIC FINDINGS: IMPRESSION: 1. Severe lumbar levoscoliosis with widespread advanced disc and facet degeneration. 2. Mild spinal stenosis at L4-5. 3. Moderate neural foraminal stenosis on the right at L3-4 and on the left at L5-S1. 4. Moderate right lateral recess stenosis at L2-3.     Electronically Signed   By: Logan Bores M.D.   On: 04/23/2021 16:41  COGNITION: Overall cognitive status: Within functional limits for tasks assessed   SENSATION: WFL  COORDINATION: Difficulty with coordination - slower response at times  EDEMA:  None   MUSCLE LENGTH: Hamstrings: Right tight; Left Tight     POSTURE: rounded shoulders, forward head, increased lumbar lordosis, and posterior pelvic tilt   LOWER EXTREMITY MMT:    MMT Right Eval Left Eval  Hip flexion 4 4  Hip extension 4 4  Hip abduction 4 4  Hip adduction 4 4  Hip internal rotation 4 4  Hip external rotation 4 4   Knee flexion 4 4  Knee extension 4 4  Ankle dorsiflexion 4 4  Ankle plantarflexion 4 4  Ankle inversion 4 4  Ankle eversion 4 4  (Blank rows = not tested)  BED MOBILITY:  Not tested  TRANSFERS: Assistive device utilized: Environmental consultant - 2 wheeled  Sit to stand: Modified independence Stand to sit: SBA Chair to chair: SBA   GAIT: Gait pattern: step through pattern, decreased arm swing- Right, decreased arm swing- Left, decreased step length- Right, decreased step length- Left, decreased stride length, decreased hip/knee flexion- Right, decreased hip/knee flexion- Left, decreased ankle dorsiflexion- Right, and decreased ankle dorsiflexion- Left Assistive device utilized: Walker - 2 wheeled Level of assistance: SBA  FUNCTIONAL TESTs:  5 times sit to stand: 14 sec without UE support Timed up and go (TUG): 15.67 sec with RW 10 meter walk test: 10.67 sec = 0.94 m/s using RW Berg Balance Scale: 48/56- see flowsheet for details  PATIENT SURVEYS:  FOTO : 49  TODAY'S TREATMENT:  08/21/2021- PT Evaluation only today   PATIENT EDUCATION: Education details: PT plan of care; purpose of Neuro PT for Parkinsons; Purpose of functional outcome measures  Person educated: Patient Education method: Explanation, Demonstration, Tactile cues, and Verbal cues Education comprehension: verbalized understanding, returned demonstration, verbal cues required, tactile cues required, and needs further education   HOME EXERCISE PROGRAM: To be initiated next visit    GOALS: Goals reviewed with patient? Yes  SHORT TERM GOALS: Target date: 10/02/2021  Pt will be independent with initial HEP in order to improve strength and balance in order to decrease fall risk and improve function at home and work.  Baseline: 08/21/2021-No formal HEP in place Goal status: INITIAL    LONG TERM GOALS: Target date: 11/13/2021  Pt will improve BERG by at least 3 points in order to demonstrate clinically significant  improvement in balance.    Baseline: 08/21/2021= 48/56 Goal status: INITIAL  2.  Pt will be independent with final HEP in order to improve strength and balance in order to decrease fall risk and improve function at home and work.  Baseline: No HEP in place Goal status: INITIAL  3.  Pt will decrease 5TSTS by at least 4 seconds in order to demonstrate clinically significant improvement  in LE strength. Baseline: 08/21/2021= 19.01 sec without UE support Goal status: INITIAL  4.  Pt will decrease TUG to below 14 seconds/decrease in order to demonstrate decreased fall risk. Baseline:  08/21/2021=15.67 with RW Goal status: INITIAL  5.  Pt will improve FOTO to target score of 56 to display perceived improvements in ability to complete ADL's.  Baseline: 08/21/2021=49 Goal status: INITIAL  6.  Pt will increase 10MWT by at least 0.15 m/s in order to demonstrate clinically significant improvement in community ambulation.   Baseline: 08/22/2021=0.94 m/s Goal status: INITIAL  ASSESSMENT:  CLINICAL IMPRESSION: Patient is a 81 y.o. male who was seen today for physical therapy evaluation and treatment for diagnosis of Parkinsons disease. PT examination reveals deficits . Pt presents with deficits in LE flexibility and strength, impaired gait and balance increasing his risk of falling. Patient has progressive symptoms associated with Parkinsons including tremors, periods of freezing, tightness and will benefit from skilled PT services to address these deficits, improve balance, and decrease risk for future falls.    OBJECTIVE IMPAIRMENTS Abnormal gait, decreased activity tolerance, decreased balance, decreased coordination, decreased endurance, decreased knowledge of use of DME, decreased mobility, difficulty walking, decreased ROM, decreased strength, hypomobility, and impaired flexibility.   ACTIVITY LIMITATIONS carrying, lifting, bending, standing, squatting, sleeping, stairs, transfers, continence,  bathing, toileting, and dressing  PARTICIPATION LIMITATIONS: cleaning, laundry, medication management, driving, shopping, community activity, and yard work  PERSONAL FACTORS Age are also affecting patient's functional outcome.   REHAB POTENTIAL: Good  CLINICAL DECISION MAKING: Stable/uncomplicated  EVALUATION COMPLEXITY: Low  PLAN: PT FREQUENCY: 2x/week  PT DURATION: 12 weeks  PLANNED INTERVENTIONS: Therapeutic exercises, Therapeutic activity, Neuromuscular re-education, Balance training, Gait training, Patient/Family education, Joint mobilization, Stair training, Vestibular training, Canalith repositioning, DME instructions, Dry Needling, Electrical stimulation, Spinal manipulation, and Spinal mobilization  PLAN FOR NEXT SESSION: Initiate Balance training, ROM for flexibility, LE strengthening and add to HEP next session.    Lewis Moccasin, PT 08/21/2021, 2:26 PM

## 2021-08-25 ENCOUNTER — Ambulatory Visit: Payer: Medicare HMO | Admitting: Speech Pathology

## 2021-08-25 ENCOUNTER — Other Ambulatory Visit: Payer: Self-pay | Admitting: Neurology

## 2021-08-25 ENCOUNTER — Encounter: Payer: Self-pay | Admitting: Speech Pathology

## 2021-08-25 DIAGNOSIS — M6281 Muscle weakness (generalized): Secondary | ICD-10-CM | POA: Diagnosis not present

## 2021-08-25 DIAGNOSIS — G20 Parkinson's disease: Secondary | ICD-10-CM

## 2021-08-25 DIAGNOSIS — R278 Other lack of coordination: Secondary | ICD-10-CM | POA: Diagnosis not present

## 2021-08-25 DIAGNOSIS — R1312 Dysphagia, oropharyngeal phase: Secondary | ICD-10-CM | POA: Diagnosis not present

## 2021-08-25 DIAGNOSIS — R471 Dysarthria and anarthria: Secondary | ICD-10-CM

## 2021-08-25 DIAGNOSIS — R2689 Other abnormalities of gait and mobility: Secondary | ICD-10-CM | POA: Diagnosis not present

## 2021-08-25 DIAGNOSIS — R490 Dysphonia: Secondary | ICD-10-CM | POA: Diagnosis not present

## 2021-08-25 DIAGNOSIS — G20A1 Parkinson's disease without dyskinesia, without mention of fluctuations: Secondary | ICD-10-CM

## 2021-08-25 DIAGNOSIS — R2681 Unsteadiness on feet: Secondary | ICD-10-CM | POA: Diagnosis not present

## 2021-08-25 DIAGNOSIS — R262 Difficulty in walking, not elsewhere classified: Secondary | ICD-10-CM | POA: Diagnosis not present

## 2021-08-25 DIAGNOSIS — R269 Unspecified abnormalities of gait and mobility: Secondary | ICD-10-CM | POA: Diagnosis not present

## 2021-08-25 NOTE — Therapy (Deleted)
OUTPATIENT SPEECH LANGUAGE PATHOLOGY TREATMENT NOTE   Patient Name: Joseph Arroyo MRN: [id number] DOB:Nov 12, 1940, 81 y.o., male Today's Date: 08/25/2021  PCP: *** REFERRING PROVIDER: ***  END OF SESSION:    Past Medical History:  Diagnosis Date   Allergic rhinitis due to pollen 11/21/2007   Brachial neuritis or radiculitis NOS    Cervicalgia    Concussion    age 25 - s/p accident   Costal chondritis    Depression    Essential and other specified forms of tremor    GERD (gastroesophageal reflux disease)    in past   Lumbago    Occlusion and stenosis of carotid artery without mention of cerebral infarction    Seizures [facility])    age 43 - after concussion   Past Surgical History:  Procedure Laterality Date   CATARACT EXTRACTION Right 07/18/14   CATARACT EXTRACTION W/PHACO Left 08/01/2014   Procedure: CATARACT EXTRACTION PHACO AND INTRAOCULAR LENS PLACEMENT ([facility]);  Surgeon: Lockie Mola, MD;  Location: [facility];  Service: Ophthalmology;  Laterality: Left;  IVA TOPICAL   COLONOSCOPY  2008   OTHER SURGICAL HISTORY  2002   ear surgery   STAPEDES SURGERY Right 2002   Patient Active Problem List   Diagnosis Date Noted   Cervicalgia 08/05/2021   Spondylosis without myelopathy or radiculopathy, lumbosacral region 06/24/2021   Lumbosacral facet hypertrophy 06/03/2021   Lumbar central spinal stenosis, w/o neurogenic claudication (L4-5) 06/03/2021   Lumbar lateral recess stenosis (Bilateral: L2-3, L4-5) (Right: L3-4) 06/03/2021   Lumbosacral foraminal stenosis (Bilateral: L2-3, L3-4) (Left: L5-S1) 06/03/2021   Lumbar nerve root impingement (Right: L3 at L2-3 & L3-4) 06/03/2021   Ligamentum flavum hypertrophy (L3-4, L4-5) 06/03/2021   Abnormal MRI, lumbar spine (04/23/2021) 04/24/2021   Long term prescription benzodiazepine use (alprazolam) (Xanax) 03/24/2021   Grade 1 Retrolisthesis of L2/L3 (5 mm) and L3/L4 (3 mm) 03/24/2021   Levoscoliosis of lumbar spine  (L3-4 apex) 03/24/2021   DDD (degenerative disc disease), lumbosacral 03/24/2021   Lumbosacral facet arthropathy (Left: L3-4, L4-5, and L5-S1) 03/24/2021   Tricompartment osteoarthritis of knee (Left) 03/24/2021   Baker cyst (Left) 03/24/2021   Chronic low back pain (1ry area of Pain) (Bilateral) (R>L) w/o sciatica 03/24/2021   Lumbar facet syndrome (Bilateral) 03/24/2021   Chronic lower extremity pain (2ry area of Pain) (Bilateral) (L>R) 03/24/2021   Lumbosacral radiculitis/sensory radiculopathy at L2 (Bilateral) 03/24/2021   Lumbosacral radiculitis/sensory radiculopathy at L3 (Bilateral) 03/24/2021   Chronic pain syndrome 03/23/2021   Pharmacologic therapy 03/23/2021   Disorder of skeletal system 03/23/2021   Problems influencing health status 03/23/2021   PAD (peripheral artery disease) ([facility]) 10/01/2020   GAD (generalized anxiety disorder) 01/13/2018   MDD (major depressive disorder) 01/13/2018   Umbilical hernia 09/04/2017   Chronic knee pain (Left) 08/11/2017   Derangement of medial meniscus, posterior horn (Left) 08/11/2017   Vitamin D insufficiency 03/05/2016   BPH without obstruction/lower urinary tract symptoms 07/08/2015   Chronic fatigue 10/23/2014   Parkinson's disease ([facility]) 09/20/2014   Major depression, recurrent, full remission ([facility]) 07/26/2013   Unsteady gait 07/26/2013   Orthostatic hypotension 07/26/2013   Depression 07/26/2013   Anxiety 06/23/2013   Chronic anxiety 06/23/2013   Tremor 05/18/2013   Bilateral carotid artery stenosis 08/30/2012    ONSET DATE: ***  REFERRING DIAG: ***  THERAPY DIAG:  No diagnosis found.  Rationale for Evaluation and Treatment {HABREHAB:27488}  SUBJECTIVE: ***  PAIN:  Are you having pain? {yes/no:20286} NPRS scale: ***/10 Pain location: *** Pain  orientation: {Pain Orientation:25161}  PAIN TYPE: {type:313116} Pain description: {PAIN DESCRIPTION:21022940}  Aggravating factors: *** Relieving factors:  ***    OBJECTIVE:   TODAY'S TREATMENT: ***  (Copy Today's treatment to Plan section here)   Zyiah [place], CCC-SLP 08/25/2021, 1:05 PM

## 2021-08-25 NOTE — Therapy (Signed)
OUTPATIENT SPEECH LANGUAGE PATHOLOGY [facility] Treatment   Patient Name: Joseph Arroyo MRN: [id number] DOB:August 19, 1940, 81 y.o., male Today's Date: 08/25/2021  PCP: Dr Nobie Putnam REFERRING PROVIDER: Dr Lavena Bullion   End of Session - 08/25/21 1433     Visit Number 2    Number of Visits 25    Date for SLP Re-Evaluation 11/12/21    Authorization Type [employer] Medicare/HMO PPO    Progress Note Due on Visit 10    SLP Start Time 11    SLP Stop Time  1425    SLP Time Calculation (min) 85 min    Activity Tolerance Patient tolerated treatment well             Past Medical History:  Diagnosis Date   Allergic rhinitis due to pollen 11/21/2007   Brachial neuritis or radiculitis NOS    Cervicalgia    Concussion    age 77 - s/p accident   Costal chondritis    Depression    Essential and other specified forms of tremor    GERD (gastroesophageal reflux disease)    in past   Lumbago    Occlusion and stenosis of carotid artery without mention of cerebral infarction    Seizures [facility])    age 89 - after concussion   Past Surgical History:  Procedure Laterality Date   CATARACT EXTRACTION Right 07/18/14   CATARACT EXTRACTION W/PHACO Left 08/01/2014   Procedure: CATARACT EXTRACTION PHACO AND INTRAOCULAR LENS PLACEMENT ([place]);  Surgeon: Leandrew Koyanagi, MD;  Location: [place];  Service: Ophthalmology;  Laterality: Left;  IVA TOPICAL   COLONOSCOPY  2008   OTHER SURGICAL HISTORY  2002   ear surgery   STAPEDES SURGERY Right 2002   Patient Active Problem List   Diagnosis Date Noted   Cervicalgia 08/05/2021   Spondylosis without myelopathy or radiculopathy, lumbosacral region 06/24/2021   Lumbosacral facet hypertrophy 06/03/2021   Lumbar central spinal stenosis, w/o neurogenic claudication (L4-5) 06/03/2021   Lumbar lateral recess stenosis (Bilateral: L2-3, L4-5) (Right: L3-4) 06/03/2021   Lumbosacral foraminal stenosis (Bilateral: L2-3, L3-4) (Left:  L5-S1) 06/03/2021   Lumbar nerve root impingement (Right: L3 at L2-3 & L3-4) 06/03/2021   Ligamentum flavum hypertrophy (L3-4, L4-5) 06/03/2021   Abnormal MRI, lumbar spine (04/23/2021) 04/24/2021   Long term prescription benzodiazepine use (alprazolam) (Xanax) 03/24/2021   Grade 1 Retrolisthesis of L2/L3 (5 mm) and L3/L4 (3 mm) 03/24/2021   Levoscoliosis of lumbar spine (L3-4 apex) 03/24/2021   DDD (degenerative disc disease), lumbosacral 03/24/2021   Lumbosacral facet arthropathy (Left: L3-4, L4-5, and L5-S1) 03/24/2021   Tricompartment osteoarthritis of knee (Left) 03/24/2021   Baker cyst (Left) 03/24/2021   Chronic low back pain (1ry area of Pain) (Bilateral) (R>L) w/o sciatica 03/24/2021   Lumbar facet syndrome (Bilateral) 03/24/2021   Chronic lower extremity pain (2ry area of Pain) (Bilateral) (L>R) 03/24/2021   Lumbosacral radiculitis/sensory radiculopathy at L2 (Bilateral) 03/24/2021   Lumbosacral radiculitis/sensory radiculopathy at L3 (Bilateral) 03/24/2021   Chronic pain syndrome 03/23/2021   Pharmacologic therapy 03/23/2021   Disorder of skeletal system 03/23/2021   Problems influencing health status 03/23/2021   PAD (peripheral artery disease) ([place]) 10/01/2020   GAD (generalized anxiety disorder) 01/13/2018   MDD (major depressive disorder) 70/35/0093   Umbilical hernia 81/82/9937   Chronic knee pain (Left) 08/11/2017   Derangement of medial meniscus, posterior horn (Left) 08/11/2017   Vitamin D insufficiency 03/05/2016   BPH without obstruction/lower urinary tract symptoms 07/08/2015   Chronic fatigue 10/23/2014  Parkinson's disease ([place]) 09/20/2014   Major depression, recurrent, full remission ([place]) 07/26/2013   Unsteady gait 07/26/2013   Orthostatic hypotension 07/26/2013   Depression 07/26/2013   Anxiety 06/23/2013   Chronic anxiety 06/23/2013   Tremor 05/18/2013   Bilateral carotid artery stenosis 08/30/2012    ONSET DATE: 05/28/2015; referral date  08/11/2021  REFERRING DIAG: G20 (ICD-10-CM) - Parkinson's disease  THERAPY DIAG:  Dysphonia  Dysarthria and anarthria  Dysphagia, oropharyngeal phase  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 Yes: NPRS scale: 2/10 Pain location: back Pain description: lower; right; left Aggravating factors: sitting Relieving factors: under care of pain doctor; receiving epidurals  PATIENT GOALS To be able to change the oil in his car; drive his motorcycle again  OBJECTIVE:   TODAY'S TREATMENT: Skilled treatment session focused on pt's dysphagia and communication goals. SLP facilitated session by providing information on the stages of swallow (using visual aid), impact that Parkinson's can have on a pt's swallow, overt s/s of potential dysphagia that pt exhibits such as wet voice, attempting to swallow secretions while talking, audible swallow, continuing forward with another Modified Barium Swallow Study. All questions were answered to pt and his wife's satisfaction but would likely benefit from further review after completion of pt's Modified Barium Swallow. SLP further facilitated session by engaging pt in challenging conversation regarding history of noncompliance with speech with voice exercises. Pt agreeable to completing HEP and making voice exercises part of his daily routine. In consult with pt and his wife, SLP created list of functional phrases for pt. With moderate assistance and use of visual cues, pt able to increase vocal intensity while reading the phrases to 78 dB. Pt's speech intelligibility while improved was still reduced d/t fast rate of speech.  Pt's list of functional phrases: How is my precious? May I  pray with you? Jesus is my healer.    PATIENT EDUCATION: Education details: oropharyngeal dysphagia, HEP, Ecologist Person educated: Patient and Spouse Education method: Explanation, Media planner, and Verbal cues Education comprehension: verbalized understanding and needs further education       GOALS: Goals reviewed with patient? Yes  SHORT TERM GOALS: Target date: 10 therapy sessions  The patient produce "ah", High/Lows, and Functional Phrases at average loudness >/= 80 dB and with loud, good quality voice with moderate cues.             Baseline: maximal cues Goal status: INITIAL  2.  The patient will complete reading drills (words/phrases, sentences) at average >/= 75 dB and with loud, good quality voice with moderate cues.  Baseline: maximal cues Goal status: INITIAL  3.   The patient will participate in 5-8 minutes conversation, maintaining average loudness of 75 dB and loud, good quality voice with min cues.  Baseline: maximal cues Goal status: INITIAL  4.  The patient will complete an instrumental evaluation (MBSS) within 3 weeks in order to evaluate swallowing safety. Baseline: last MBSS 2017 Goal status: INITIAL   LONG TERM GOALS: Target date: 11/12/2021    The patient will participate in 15-20 minutes conversation, maintaining average loudness of 75 dB and loud, good quality voice. Baseline: 60dB Goal status: INITIAL  2.   Patient will report improved communication effectiveness as measured by Communicative Effectiveness Survey.  Baseline:  Goal status: INITIAL  3.  Pt will consume least restrictive diet with no s/s of respiratory decline.  Baseline: new goal Goal status: INITIAL   ASSESSMENT:  CLINICAL IMPRESSION: Pt presents with moderate to severely decreased speech intelligibility and mild to moderate overt s/s of potential dysphagia/aspiration. When producing 2 sentences in connected speech, pt's speech intensity is ~ 65 dB with fast  pressed rate of speech resulting in ~ 30% speech intelligibility.   OBJECTIVE IMPAIRMENTS  Objective impairments include voice disorder and dysphagia. These impairments are limiting patient from effectively communicating at home and in community and safety when swallowing.Factors affecting potential to achieve goals and functional outcome are co-morbidities, medical prognosis, pain level, and severity of impairments.Patient will benefit from skilled SLP services to address above impairments and improve overall function.  REHAB POTENTIAL: Fair multiple previous ST sessions; progressive severe chronic back pain  PLAN: SLP FREQUENCY: 2x/week  SLP DURATION: 12 weeks  PLANNED INTERVENTIONS: Aspiration precaution training, Pharyngeal strengthening exercises, Diet toleration management , Trials of upgraded texture/liquids, SLP instruction and feedback, and Compensatory strategies   Soley Harriss B. Rutherford Nail, M.S., CCC-SLP, Mining engineer Certified Brain Injury [place]  [place] Office [phone] Ascom [phone] Fax [phone]

## 2021-08-26 ENCOUNTER — Encounter: Payer: Self-pay | Admitting: Pain Medicine

## 2021-08-26 ENCOUNTER — Ambulatory Visit: Payer: Medicare HMO | Attending: Pain Medicine | Admitting: Pain Medicine

## 2021-08-26 ENCOUNTER — Ambulatory Visit
Admission: RE | Admit: 2021-08-26 | Discharge: 2021-08-26 | Disposition: A | Discharge status: Home / Self Care | Payer: Medicare HMO | Source: Ambulatory Visit | Attending: Pain Medicine | Admitting: Pain Medicine

## 2021-08-26 VITALS — BP 106/73 | HR 58 | Temp 97.2°F | Resp 16 | Ht 67.0 in | Wt 126.0 lb

## 2021-08-26 DIAGNOSIS — M5416 Radiculopathy, lumbar region: Secondary | ICD-10-CM | POA: Insufficient documentation

## 2021-08-26 DIAGNOSIS — M4310 Spondylolisthesis, site unspecified: Secondary | ICD-10-CM | POA: Insufficient documentation

## 2021-08-26 DIAGNOSIS — M47817 Spondylosis without myelopathy or radiculopathy, lumbosacral region: Secondary | ICD-10-CM | POA: Insufficient documentation

## 2021-08-26 DIAGNOSIS — M4807 Spinal stenosis, lumbosacral region: Secondary | ICD-10-CM | POA: Insufficient documentation

## 2021-08-26 DIAGNOSIS — M545 Low back pain, unspecified: Secondary | ICD-10-CM | POA: Diagnosis not present

## 2021-08-26 DIAGNOSIS — G8929 Other chronic pain: Secondary | ICD-10-CM | POA: Insufficient documentation

## 2021-08-26 DIAGNOSIS — M5417 Radiculopathy, lumbosacral region: Secondary | ICD-10-CM | POA: Diagnosis not present

## 2021-08-26 DIAGNOSIS — M48061 Spinal stenosis, lumbar region without neurogenic claudication: Secondary | ICD-10-CM | POA: Insufficient documentation

## 2021-08-26 DIAGNOSIS — M5137 Other intervertebral disc degeneration, lumbosacral region: Secondary | ICD-10-CM | POA: Insufficient documentation

## 2021-08-26 DIAGNOSIS — R937 Abnormal findings on diagnostic imaging of other parts of musculoskeletal system: Secondary | ICD-10-CM | POA: Insufficient documentation

## 2021-08-26 MED ORDER — LIDOCAINE HCL 2 % IJ SOLN
20.0000 mL | Freq: Once | INTRAMUSCULAR | Status: AC
  Administered 2021-08-26: 400 mg
  Filled 2021-08-26: qty 20

## 2021-08-26 MED ORDER — LACTATED RINGERS IV SOLN: Freq: Once | INTRAVENOUS | Status: AC

## 2021-08-26 MED ORDER — PENTAFLUOROPROP-TETRAFLUOROETH EX AERO
INHALATION_SPRAY | Freq: Once | CUTANEOUS | Status: AC
  Administered 2021-08-26: 30 via TOPICAL

## 2021-08-26 MED ORDER — ROPIVACAINE HCL 2 MG/ML IJ SOLN
2.0000 mL | Freq: Once | INTRAMUSCULAR | Status: AC
  Administered 2021-08-26: 2 mL via EPIDURAL
  Filled 2021-08-26: qty 20

## 2021-08-26 MED ORDER — TRIAMCINOLONE ACETONIDE 40 MG/ML IJ SUSP
40.0000 mg | Freq: Once | INTRAMUSCULAR | Status: AC
  Administered 2021-08-26: 40 mg
  Filled 2021-08-26: qty 1

## 2021-08-26 MED ORDER — MIDAZOLAM HCL 5 MG/5ML IJ SOLN
0.5000 mg | Freq: Once | INTRAMUSCULAR | Status: AC
  Administered 2021-08-26: 1.5 mg via INTRAVENOUS
  Filled 2021-08-26: qty 5

## 2021-08-26 MED ORDER — IOHEXOL 180 MG/ML  SOLN
10.0000 mL | Freq: Once | INTRAMUSCULAR | Status: AC
  Administered 2021-08-26: 5 mL via EPIDURAL
  Filled 2021-08-26: qty 20

## 2021-08-26 MED ORDER — SODIUM CHLORIDE 0.9% FLUSH
2.0000 mL | Freq: Once | INTRAVENOUS | Status: AC
  Administered 2021-08-26: 2 mL

## 2021-08-27 ENCOUNTER — Ambulatory Visit: Payer: Medicare HMO | Admitting: Speech Pathology

## 2021-08-27 ENCOUNTER — Telehealth: Payer: Self-pay

## 2021-08-27 NOTE — Telephone Encounter (Signed)
Post procedure phone call. Patient states he is doing well.  

## 2021-08-28 ENCOUNTER — Ambulatory Visit: Payer: Medicare HMO | Admitting: Speech Pathology

## 2021-08-28 DIAGNOSIS — M6281 Muscle weakness (generalized): Secondary | ICD-10-CM | POA: Diagnosis not present

## 2021-08-28 DIAGNOSIS — R490 Dysphonia: Secondary | ICD-10-CM | POA: Diagnosis not present

## 2021-08-28 DIAGNOSIS — R2681 Unsteadiness on feet: Secondary | ICD-10-CM | POA: Diagnosis not present

## 2021-08-28 DIAGNOSIS — R471 Dysarthria and anarthria: Secondary | ICD-10-CM | POA: Diagnosis not present

## 2021-08-28 DIAGNOSIS — R1312 Dysphagia, oropharyngeal phase: Secondary | ICD-10-CM | POA: Diagnosis not present

## 2021-08-28 DIAGNOSIS — R262 Difficulty in walking, not elsewhere classified: Secondary | ICD-10-CM | POA: Diagnosis not present

## 2021-08-28 DIAGNOSIS — R2689 Other abnormalities of gait and mobility: Secondary | ICD-10-CM | POA: Diagnosis not present

## 2021-08-28 DIAGNOSIS — G20 Parkinson's disease: Secondary | ICD-10-CM | POA: Diagnosis not present

## 2021-08-28 DIAGNOSIS — R278 Other lack of coordination: Secondary | ICD-10-CM | POA: Diagnosis not present

## 2021-08-28 DIAGNOSIS — R269 Unspecified abnormalities of gait and mobility: Secondary | ICD-10-CM | POA: Diagnosis not present

## 2021-08-28 NOTE — Therapy (Signed)
OUTPATIENT SPEECH LANGUAGE PATHOLOGY [facility] Treatment   Patient Name: Joseph Arroyo MRN: [id number] DOB:08-Jul-1940, 81 y.o., male Today's Date: 08/28/2021  PCP: Dr Nobie Putnam REFERRING PROVIDER: Dr Lavena Bullion   End of Session - 08/28/21 1427     Visit Number 3    Number of Visits 25    Date for SLP Re-Evaluation 11/12/21    Authorization Type [employer] Medicare/HMO PPO    Progress Note Due on Visit 10    SLP Start Time 28    SLP Stop Time  1500    SLP Time Calculation (min) 60 min    Activity Tolerance Patient tolerated treatment well             Past Medical History:  Diagnosis Date   Allergic rhinitis due to pollen 11/21/2007   Brachial neuritis or radiculitis NOS    Cervicalgia    Concussion    age 68 - s/p accident   Costal chondritis    Depression    Essential and other specified forms of tremor    GERD (gastroesophageal reflux disease)    in past   Lumbago    Occlusion and stenosis of carotid artery without mention of cerebral infarction    Seizures [facility])    age 10 - after concussion   Past Surgical History:  Procedure Laterality Date   CATARACT EXTRACTION Right 07/18/14   CATARACT EXTRACTION W/PHACO Left 08/01/2014   Procedure: CATARACT EXTRACTION PHACO AND INTRAOCULAR LENS PLACEMENT ([place]);  Surgeon: Leandrew Koyanagi, MD;  Location: [place];  Service: Ophthalmology;  Laterality: Left;  IVA TOPICAL   COLONOSCOPY  2008   OTHER SURGICAL HISTORY  2002   ear surgery   STAPEDES SURGERY Right 2002   Patient Active Problem List   Diagnosis Date Noted   Cervicalgia 08/05/2021   Spondylosis without myelopathy or radiculopathy, lumbosacral region 06/24/2021   Lumbosacral facet hypertrophy 06/03/2021   Lumbar central spinal stenosis, w/o neurogenic claudication (L4-5) 06/03/2021   Lumbar lateral recess stenosis (Bilateral: L2-3, L4-5) (Right: L3-4) 06/03/2021   Lumbosacral foraminal stenosis (Bilateral: L2-3, L3-4) (Left:  L5-S1) 06/03/2021   Lumbar nerve root impingement (Right: L3 at L2-3 & L3-4) 06/03/2021   Ligamentum flavum hypertrophy (L3-4, L4-5) 06/03/2021   Abnormal MRI, lumbar spine (04/23/2021) 04/24/2021   Long term prescription benzodiazepine use (alprazolam) (Xanax) 03/24/2021   Grade 1 Retrolisthesis of L2/L3 (5 mm) and L3/L4 (3 mm) 03/24/2021   Levoscoliosis of lumbar spine (L3-4 apex) 03/24/2021   DDD (degenerative disc disease), lumbosacral 03/24/2021   Lumbosacral facet arthropathy (Left: L3-4, L4-5, and L5-S1) 03/24/2021   Tricompartment osteoarthritis of knee (Left) 03/24/2021   Baker cyst (Left) 03/24/2021   Chronic low back pain (1ry area of Pain) (Bilateral) (R>L) w/o sciatica 03/24/2021   Lumbar facet syndrome (Bilateral) 03/24/2021   Chronic lower extremity pain (2ry area of Pain) (Bilateral) (L>R) 03/24/2021   Lumbosacral radiculitis/sensory radiculopathy at L2 (Bilateral) 03/24/2021   Lumbosacral radiculitis/sensory radiculopathy at L3 (Bilateral) 03/24/2021   Chronic pain syndrome 03/23/2021   Pharmacologic therapy 03/23/2021   Disorder of skeletal system 03/23/2021   Problems influencing health status 03/23/2021   PAD (peripheral artery disease) ([place]) 10/01/2020   GAD (generalized anxiety disorder) 01/13/2018   MDD (major depressive disorder) 81/27/5170   Umbilical hernia 01/74/9449   Chronic knee pain (Left) 08/11/2017   Derangement of medial meniscus, posterior horn (Left) 08/11/2017   Vitamin D insufficiency 03/05/2016   BPH without obstruction/lower urinary tract symptoms 07/08/2015   Chronic fatigue 10/23/2014  Parkinson's disease ([place]) 09/20/2014   Major depression, recurrent, full remission ([place]) 07/26/2013   Unsteady gait 07/26/2013   Orthostatic hypotension 07/26/2013   Depression 07/26/2013   Anxiety 06/23/2013   Chronic anxiety 06/23/2013   Tremor 05/18/2013   Bilateral carotid artery stenosis 08/30/2012    ONSET DATE: 05/28/2015; referral date  08/11/2021  REFERRING DIAG: G20 (ICD-10-CM) - Parkinson's disease  THERAPY DIAG:  Dysphonia  Dysarthria and anarthria  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 Yes: NPRS scale: 2/10 Pain location: back Pain description: lower; right; left Aggravating factors: sitting Relieving factors: under care of pain doctor; receiving epidurals  PATIENT GOALS To be able to change the oil in his car; drive his motorcycle again  OBJECTIVE:   TODAY'S TREATMENT: The Communication Effectiveness Survey is a patient-reported outcome measure in which the patient rates their own effectiveness in different communication situations. A higher score indicates greater effectiveness. Pt's self-rating was 11/32.   PATIENT EDUCATION: Education details: oropharyngeal dysphagia, HEP, Ecologist Person educated: Patient and Spouse Education method: Explanation, Media planner, and Verbal cues Education comprehension: verbalized understanding and needs further education       GOALS: Goals reviewed with patient? Yes  SHORT TERM GOALS: Target date: 10 therapy sessions  The patient produce "ah", High/Lows, and Functional Phrases at average loudness >/= 80 dB and with loud, good quality voice with moderate cues.             Baseline: maximal cues Goal status: INITIAL  2.  The patient will complete reading drills (words/phrases, sentences) at average >/= 75 dB and with loud, good quality voice with moderate cues.  Baseline: maximal cues Goal status: INITIAL  3.   The patient will participate in 5-8 minutes conversation, maintaining average loudness of 75 dB and loud, good quality voice with min cues.  Baseline:  maximal cues Goal status: INITIAL  4.  The patient will complete an instrumental evaluation (MBSS) within 3 weeks in order to evaluate swallowing safety. Baseline: last MBSS 2017 Goal status: INITIAL   LONG TERM GOALS: Target date: 11/12/2021    The patient will participate in 15-20 minutes conversation, maintaining average loudness of 75 dB and loud, good quality voice. Baseline: 60dB Goal status: INITIAL  2.   Patient will report improved communication effectiveness as measured by Communicative Effectiveness Survey.   Baseline:  Goal status: INITIAL  3.  Pt will consume least restrictive diet with no s/s of respiratory decline.  Baseline: new goal Goal status: INITIAL   ASSESSMENT:  CLINICAL IMPRESSION: Pt presents with moderate to severely decreased speech intelligibility and mild to moderate overt s/s of potential dysphagia/aspiration. When producing 2 sentences in connected speech, pt's speech intensity is ~ 65 dB with fast pressed rate of speech resulting in ~ 30% speech intelligibility.   OBJECTIVE IMPAIRMENTS  Objective impairments include voice disorder and dysphagia. These impairments are limiting patient from effectively communicating at home and in community and safety when swallowing.Factors affecting potential to achieve goals and functional outcome are co-morbidities, medical prognosis, pain level, and severity of impairments.Patient will benefit from skilled SLP services to address above impairments and improve overall function.  REHAB POTENTIAL: Fair multiple previous ST sessions; progressive severe chronic back pain  PLAN: SLP FREQUENCY: 2x/week  SLP DURATION:  12 weeks  PLANNED INTERVENTIONS: Aspiration precaution training, Pharyngeal strengthening exercises, Diet toleration management , Trials of upgraded texture/liquids, SLP instruction and feedback, and Compensatory strategies   Francille Wittmann B. Rutherford Nail, M.S., CCC-SLP, Mining engineer Certified  Brain Injury [place]  [place] Office [phone] Ascom [phone] Fax [phone]

## 2021-09-03 ENCOUNTER — Ambulatory Visit: Payer: Medicare HMO | Attending: Neurology | Admitting: Occupational Therapy

## 2021-09-03 ENCOUNTER — Ambulatory Visit: Payer: Medicare HMO | Admitting: Speech Pathology

## 2021-09-03 ENCOUNTER — Encounter: Payer: Self-pay | Admitting: Occupational Therapy

## 2021-09-03 DIAGNOSIS — R490 Dysphonia: Secondary | ICD-10-CM

## 2021-09-03 DIAGNOSIS — R471 Dysarthria and anarthria: Secondary | ICD-10-CM | POA: Diagnosis not present

## 2021-09-03 DIAGNOSIS — R1312 Dysphagia, oropharyngeal phase: Secondary | ICD-10-CM | POA: Insufficient documentation

## 2021-09-03 DIAGNOSIS — R278 Other lack of coordination: Secondary | ICD-10-CM | POA: Diagnosis not present

## 2021-09-03 DIAGNOSIS — R2681 Unsteadiness on feet: Secondary | ICD-10-CM | POA: Diagnosis not present

## 2021-09-03 DIAGNOSIS — R41841 Cognitive communication deficit: Secondary | ICD-10-CM | POA: Insufficient documentation

## 2021-09-03 DIAGNOSIS — G20 Parkinson's disease: Secondary | ICD-10-CM | POA: Insufficient documentation

## 2021-09-03 DIAGNOSIS — R262 Difficulty in walking, not elsewhere classified: Secondary | ICD-10-CM | POA: Diagnosis not present

## 2021-09-03 DIAGNOSIS — M6281 Muscle weakness (generalized): Secondary | ICD-10-CM | POA: Diagnosis not present

## 2021-09-03 DIAGNOSIS — R269 Unspecified abnormalities of gait and mobility: Secondary | ICD-10-CM | POA: Insufficient documentation

## 2021-09-03 DIAGNOSIS — R2689 Other abnormalities of gait and mobility: Secondary | ICD-10-CM | POA: Insufficient documentation

## 2021-09-03 NOTE — Therapy (Unsigned)
OUTPATIENT SPEECH LANGUAGE PATHOLOGY [facility]   Patient Name: Joseph Arroyo MRN: [id number] DOB:12-Apr-1940, 81 y.o., male Today's Date: 09/03/2021  PCP: Dr Nobie Putnam REFERRING PROVIDER: Dr Lavena Bullion   End of Session - 09/03/21 1546     Visit Number 5    Number of Visits 25    Date for SLP Re-Evaluation 11/12/21    Authorization Type [employer]/H[employer] PPO    Progress Note Due on Visit 10    SLP Start Time 75    SLP Stop Time  1530    SLP Time Calculation (min) 60 min    Activity Tolerance Patient tolerated treatment well             Past Medical History:  Diagnosis Date   Allergic rhinitis due to pollen 11/21/2007   Brachial neuritis or radiculitis NOS    Cervicalgia    Concussion    age 67 - s/p accident   Costal chondritis    Depression    Essential and other specified forms of tremor    GERD (gastroesophageal reflux disease)    in past   Lumbago    Occlusion and stenosis of carotid artery without mention of cerebral infarction    Seizures [facility])    age 64 - after concussion   Past Surgical History:  Procedure Laterality Date   CATARACT EXTRACTION Right 07/18/14   CATARACT EXTRACTION W/PHACO Left 08/01/2014   Procedure: CATARACT EXTRACTION PHACO AND INTRAOCULAR LENS PLACEMENT ([place]);  Surgeon: Leandrew Koyanagi, MD;  Location: [place];  Service: Ophthalmology;  Laterality: Left;  IVA TOPICAL   COLONOSCOPY  2008   OTHER SURGICAL HISTORY  2002   ear surgery   STAPEDES SURGERY Right 2002   Patient Active Problem List   Diagnosis Date Noted   Cervicalgia 08/05/2021   Spondylosis without myelopathy or radiculopathy, lumbosacral region 06/24/2021   Lumbosacral facet hypertrophy 06/03/2021   Lumbar central spinal stenosis, w/o neurogenic claudication (L4-5) 06/03/2021   Lumbar lateral recess stenosis (Bilateral: L2-3, L4-5) (Right: L3-4) 06/03/2021   Lumbosacral foraminal stenosis (Bilateral: L2-3, L3-4) (Left:  L5-S1) 06/03/2021   Lumbar nerve root impingement (Right: L3 at L2-3 & L3-4) 06/03/2021   Ligamentum flavum hypertrophy (L3-4, L4-5) 06/03/2021   Abnormal MRI, lumbar spine (04/23/2021) 04/24/2021   Long term prescription benzodiazepine use (alprazolam) (Xanax) 03/24/2021   Grade 1 Retrolisthesis of L2/L3 (5 mm) and L3/L4 (3 mm) 03/24/2021   Levoscoliosis of lumbar spine (L3-4 apex) 03/24/2021   DDD (degenerative disc disease), lumbosacral 03/24/2021   Lumbosacral facet arthropathy (Left: L3-4, L4-5, and L5-S1) 03/24/2021   Tricompartment osteoarthritis of knee (Left) 03/24/2021   Baker cyst (Left) 03/24/2021   Chronic low back pain (1ry area of Pain) (Bilateral) (R>L) w/o sciatica 03/24/2021   Lumbar facet syndrome (Bilateral) 03/24/2021   Chronic lower extremity pain (2ry area of Pain) (Bilateral) (L>R) 03/24/2021   Lumbosacral radiculitis/sensory radiculopathy at L2 (Bilateral) 03/24/2021   Lumbosacral radiculitis/sensory radiculopathy at L3 (Bilateral) 03/24/2021   Chronic pain syndrome 03/23/2021   Pharmacologic therapy 03/23/2021   Disorder of skeletal system 03/23/2021   Problems influencing health status 03/23/2021   PAD (peripheral artery disease) ([place]) 10/01/2020   GAD (generalized anxiety disorder) 01/13/2018   MDD (major depressive disorder) 59/16/3846   Umbilical hernia 65/99/3570   Chronic knee pain (Left) 08/11/2017   Derangement of medial meniscus, posterior horn (Left) 08/11/2017   Vitamin D insufficiency 03/05/2016   BPH without obstruction/lower urinary tract symptoms 07/08/2015   Chronic fatigue 10/23/2014  Parkinson's disease ([place]) 09/20/2014   Major depression, recurrent, full remission ([place]) 07/26/2013   Unsteady gait 07/26/2013   Orthostatic hypotension 07/26/2013   Depression 07/26/2013   Anxiety 06/23/2013   Chronic anxiety 06/23/2013   Tremor 05/18/2013   Bilateral carotid artery stenosis 08/30/2012    ONSET DATE: 05/28/2015; referral date  08/11/2021  REFERRING DIAG: G20 (ICD-10-CM) - Parkinson's disease  THERAPY DIAG:  Dysphagia, oropharyngeal phase  Dysarthria and anarthria  Dysphonia  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 Yes: NPRS scale: 2/10 Pain location: back Pain description: lower; right; left Aggravating factors: sitting Relieving factors: under care of pain doctor; receiving epidurals  PATIENT GOALS To be able to change the oil in his car; drive his motorcycle again  OBJECTIVE:   TODAY'S TREATMENT:  Skilled treatment session focused on pt's speech intelligibility goals. While pt is always pleasant, he continues to be pleasantly unaware of his speech intelligibility deficits. SLP facilitated session by providing moderate assistance for over-articulation and increased vocal intensity when reading list of functional phrases. With moderate assistance, pt able to achieve ~ 83 dB. Pt has a very strong belief system which leads him to explain alternative reasons for cues. As a result, therapy trials are limited.   The Communication Effectiveness Survey is a patient-reported outcome measure in which the patient rates their own effectiveness in different communication situations. A higher score indicates greater effectiveness. Pt's self-rating was 11/32.   PATIENT EDUCATION: Education details: oropharyngeal dysphagia, HEP, Ecologist Person educated: Patient and Spouse Education method: Explanation, Media planner, and Verbal cues Education comprehension: verbalized understanding and needs further education       GOALS: Goals reviewed with patient? Yes  SHORT TERM GOALS: Target date: 10 therapy sessions  The  patient produce "ah", High/Lows, and Functional Phrases at average loudness >/= 80 dB and with loud, good quality voice with moderate cues.             Baseline: maximal cues Goal status: INITIAL  2.  The patient will complete reading drills (words/phrases, sentences) at average >/= 75 dB and with loud, good quality voice with moderate cues.  Baseline: maximal cues Goal status: INITIAL  3.   The patient will participate in 5-8 minutes conversation, maintaining average loudness of 75 dB and loud, good quality voice with min cues.  Baseline: maximal cues Goal status: INITIAL  4.  The patient will complete an instrumental evaluation (MBSS) within 3 weeks in order to evaluate swallowing safety. Baseline: last MBSS 2017 Goal status: INITIAL   LONG TERM GOALS: Target date: 11/12/2021    The patient will participate in 15-20 minutes conversation, maintaining average loudness of 75 dB and loud, good quality voice. Baseline: 60dB Goal status: INITIAL  2.   Patient will report improved communication effectiveness as measured by Communicative Effectiveness Survey.   Baseline:  Goal status: INITIAL  3.  Pt will consume least restrictive diet with no s/s of respiratory decline.  Baseline: new goal Goal status: INITIAL   ASSESSMENT:  CLINICAL IMPRESSION: Pt's prognosis for improvement is guarded d/t severity of deficits, severely decreased awareness/insight into deficits, multiple courses of speech therapy. Continue to recommend brief course of skilled ST to target his severe motor speech impairments to increase functional independence and reduce caregiver  burden.   OBJECTIVE IMPAIRMENTS  Objective impairments include voice disorder and dysphagia. These impairments are limiting patient from effectively communicating at home and in community and safety when swallowing.Factors affecting potential to achieve goals and functional outcome are co-morbidities, medical prognosis, pain level, and  severity of impairments.Patient will benefit from skilled SLP services to address above impairments and improve overall function.  REHAB POTENTIAL: Fair multiple previous ST sessions; progressive severe chronic back pain  PLAN: SLP FREQUENCY: 2x/week  SLP DURATION: 12 weeks  PLANNED INTERVENTIONS: Aspiration precaution training, Pharyngeal strengthening exercises, Diet toleration management , Trials of upgraded texture/liquids, SLP instruction and feedback, and Compensatory strategies   Makenzye Troutman B. Rutherford Nail, M.S., CCC-SLP, Mining engineer Certified Brain Injury [place]  [place] Office [phone] Ascom [phone] Fax [phone]

## 2021-09-03 NOTE — Therapy (Addendum)
OUTPATIENT OCCUPATIONAL THERAPY NEURO TREATMENT  Patient Name: Joseph Arroyo MRN: [id number] DOB:12/22/1940, 81 y.o., male Today's Date: 09/03/2021  PCP: Dr. Nobie Putnam REFERRING PROVIDER: Dr. Lavena Bullion   OT End of Session - 09/03/21 1813     Visit Number 2    Number of Visits 24    Date for OT Re-Evaluation 11/11/21    Authorization Time Period Reporting period beginning 08/19/21    OT Start Time 1345    OT Stop Time 1430    OT Time Calculation (min) 45 min    Activity Tolerance Patient tolerated treatment well    Behavior During Therapy [facility] for tasks assessed/performed             Past Medical History:  Diagnosis Date   Allergic rhinitis due to pollen 11/21/2007   Brachial neuritis or radiculitis NOS    Cervicalgia    Concussion    age 72 - s/p accident   Costal chondritis    Depression    Essential and other specified forms of tremor    GERD (gastroesophageal reflux disease)    in past   Lumbago    Occlusion and stenosis of carotid artery without mention of cerebral infarction    Seizures [facility])    age 59 - after concussion   Past Surgical History:  Procedure Laterality Date   CATARACT EXTRACTION Right 07/18/14   CATARACT EXTRACTION W/PHACO Left 08/01/2014   Procedure: CATARACT EXTRACTION PHACO AND INTRAOCULAR LENS PLACEMENT ([place]);  Surgeon: Leandrew Koyanagi, MD;  Location: [place];  Service: Ophthalmology;  Laterality: Left;  IVA TOPICAL   COLONOSCOPY  2008   OTHER SURGICAL HISTORY  2002   ear surgery   STAPEDES SURGERY Right 2002   Patient Active Problem List   Diagnosis Date Noted   Cervicalgia 08/05/2021   Spondylosis without myelopathy or radiculopathy, lumbosacral region 06/24/2021   Lumbosacral facet hypertrophy 06/03/2021   Lumbar central spinal stenosis, w/o neurogenic claudication (L4-5) 06/03/2021   Lumbar lateral recess stenosis (Bilateral: L2-3, L4-5) (Right: L3-4) 06/03/2021   Lumbosacral foraminal stenosis  (Bilateral: L2-3, L3-4) (Left: L5-S1) 06/03/2021   Lumbar nerve root impingement (Right: L3 at L2-3 & L3-4) 06/03/2021   Ligamentum flavum hypertrophy (L3-4, L4-5) 06/03/2021   Abnormal MRI, lumbar spine (04/23/2021) 04/24/2021   Long term prescription benzodiazepine use (alprazolam) (Xanax) 03/24/2021   Grade 1 Retrolisthesis of L2/L3 (5 mm) and L3/L4 (3 mm) 03/24/2021   Levoscoliosis of lumbar spine (L3-4 apex) 03/24/2021   DDD (degenerative disc disease), lumbosacral 03/24/2021   Lumbosacral facet arthropathy (Left: L3-4, L4-5, and L5-S1) 03/24/2021   Tricompartment osteoarthritis of knee (Left) 03/24/2021   Baker cyst (Left) 03/24/2021   Chronic low back pain (1ry area of Pain) (Bilateral) (R>L) w/o sciatica 03/24/2021   Lumbar facet syndrome (Bilateral) 03/24/2021   Chronic lower extremity pain (2ry area of Pain) (Bilateral) (L>R) 03/24/2021   Lumbosacral radiculitis/sensory radiculopathy at L2 (Bilateral) 03/24/2021   Lumbosacral radiculitis/sensory radiculopathy at L3 (Bilateral) 03/24/2021   Chronic pain syndrome 03/23/2021   Pharmacologic therapy 03/23/2021   Disorder of skeletal system 03/23/2021   Problems influencing health status 03/23/2021   PAD (peripheral artery disease) ([place]) 10/01/2020   GAD (generalized anxiety disorder) 01/13/2018   MDD (major depressive disorder) 37/62/8315   Umbilical hernia 17/61/6073   Chronic knee pain (Left) 08/11/2017   Derangement of medial meniscus, posterior horn (Left) 08/11/2017   Vitamin D insufficiency 03/05/2016   BPH without obstruction/lower urinary tract symptoms 07/08/2015   Chronic fatigue 10/23/2014  Parkinson's disease ([place]) 09/20/2014   Major depression, recurrent, full remission ([place]) 07/26/2013   Unsteady gait 07/26/2013   Orthostatic hypotension 07/26/2013   Depression 07/26/2013   Anxiety 06/23/2013   Chronic anxiety 06/23/2013   Tremor 05/18/2013   Bilateral carotid artery stenosis 08/30/2012    ONSET DATE:  08/11/21 date of referral.  PD symptoms since 2015.  REFERRING DIAG: Parkinson's Disease  THERAPY DIAG:  Muscle weakness (generalized)  Other lack of coordination  Rationale for Evaluation and Treatment Rehabilitation  SUBJECTIVE:   SUBJECTIVE STATEMENT: Pt. Reports that he tried to write checks at home, however his writing was so small.   PERTINENT HISTORY: Per chart, pt with hx of Parkinson's disease ([place]); Chronic knee pain (Left); Derangement of medial meniscus, posterior horn (Left); PAD (peripheral artery disease) ([place]); Chronic pain syndrome; DDD (degenerative disc disease) lumbosacral; osteoarthritis of knee (Left); Chronic low back pain, Lumbar facet syndrome (Bilateral); Chronic lower extremity pain. PRECAUTIONS: Fall  WEIGHT BEARING RESTRICTIONS No  PAIN:  Are you having pain? Yes: NPRS scale: 3/10 Pain location: low back Pain description: achy Aggravating factors: N/A, constant Relieving factors: rest, repositioning  FALLS: Has patient fallen in last 6 months? Yes. Number of falls 1-2  LIVING ENVIRONMENT: Lives with: spouse  Lives in: 1 level home  Stairs: 4 steps, a landing, then 3 more steps with bilat rails.  Spouse reports church will likely be building them a ramp Has following equipment at home: Gilford Rile - 2 wheeled, Shower bench, bed side commode, and Grab bars  PLOF: Needs assistance with ADLs.  Pt performs basic self care with set up assist, extra time to manage clothing fasteners.  Pt can take his own shower and dress self, though efficiency is lacking; extra time needed.  Pt/spouse go to a local pool several times per week, and doffing a wet suit then dressing after can take up to 30 min.    PATIENT GOALS : Increase efficiency with daily tasks, increase FMC skills   OBJECTIVE:  HAND DOMINANCE: Right  ADLs: Overall ADLs: performs basic self care with set up, extra time, occasional assist with small clothing fasteners.   Transfers/ambulation related to  ADLs: supv-modified indep using RW. Eating: difficulty cutting food Grooming: modified indep, including shaving with electric razor and straight razor. UB Dressing: extra time to manage clothing fasteners, occasional assist with smaller buttons LB Dressing: extra time to don shoes Toileting: modified indep; 3in1 over toilet Bathing: modified indep, extra time.   Tub Shower transfers: transfer tub bench/modified indep. Equipment: Transfer tub bench, Grab bars, and bed side commode, RW   IADLs: Shopping: Spouse manages at baseline Light housekeeping: spouse manages at baseline Meal Prep: Spouse manages at [employer] mobility: RW, transport chair Medication management: modified Chief Executive Officer: pt requires extra time for writing checks, but pt manages finances independently. Handwriting: 100% legible and Increased time at time of eval, though pt admits this can fluctuate when fatigued.  MOBILITY STATUS: Hx of falls  POSTURE COMMENTS:  rounded shoulders, increased thoracic kyphosis, and hx of scoliosis , L shoulder rests significantly lower than R Sitting balance: Supports self with one extremity  ACTIVITY TOLERANCE: Activity tolerance: Pt reports dressing/bathing tasks cause fatigue, requiring extra time to complete.  Occasional need for transport chair for community mobility.   FUNCTIONAL OUTCOME MEASURES: FOTO: 60  UPPER EXTREMITY ROM     Active ROM Right eval Left eval  Shoulder flexion 135 150  Shoulder abduction 180 180  Shoulder adduction    Shoulder  extension    Shoulder internal rotation    Shoulder external rotation WNL WNL  Elbow flexion    Elbow extension    Wrist flexion    Wrist extension    Wrist ulnar deviation    Wrist radial deviation    Wrist pronation    Wrist supination    (Blank rows = not tested)   UPPER EXTREMITY MMT:     MMT Right eval Left eval  Shoulder flexion 5/5 5/5  Shoulder abduction 5/5 5/5  Shoulder  adduction    Shoulder extension    Shoulder internal rotation    Shoulder external rotation    Middle trapezius    Lower trapezius    Elbow flexion 5/5 5/5  Elbow extension 5/5 5/5  Wrist flexion 5/5 5/5  Wrist extension 5/5 5/5  Wrist ulnar deviation    Wrist radial deviation    Wrist pronation    Wrist supination    (Blank rows = not tested)  HAND FUNCTION: Grip strength: Right: 50 lbs; Left: 40 lbs, Lateral pinch: Right: 14 lbs, Left: 8 lbs, and 3 point pinch: Right: 11 lbs, Left: 9 lbs  COORDINATION: 9 Hole Peg test: Right: 34 sec; Left: 35 sec Tremors: Resting, action, Right, and Left; more pronounced on the L; mild at time of eval.   Pt takes Parkinson's meds 3x daily, 7a, 12p, 5 p  SENSATION: Not tested    MUSCLE TONE: RUE: Within functional limits and LUE: Within functional limits  COGNITION: Overall cognitive status: Pt appears to be a good historian, alert and oriented x4.  Seems to lack some awareness into deficits/safety as noted by some reported unrealistic functional goals.   VISION: Subjective report: No changes from baseline.  Baseline vision: Wears glasses for reading only Visual history: macular degeneration, hx of cataract removal both eyes; no report of diplopia   OBSERVATIONS: Pt presents with mild BUE coordination impairment, mild dyskinesia with resting hand tremor more pronounced on the L, mild also with activity.    TODAY'S TREATMENT:    Pt. Worked on simulated check writing tasks. Pt. Required increased time to complete 6 checks with 75% legibility.  Pt. worked on bilateral [facility] skills grasping 1" sticks, 1/4" collars, and 1/4" washers. Pt. worked on storing the objects in the palm, and translatory skills moving the items from the palm of the hand to the tip of the 2nd digit, and thumb.  Pt. required visual cues, and assist to perform the task.     PATIENT EDUCATION: Education details: Proximal stabilization techniques for improved distal  control, compensatory strategies for tremors. Person educated: Patient and Spouse Education method: Explanation and Verbal cues Education comprehension: verbalized understanding   HOME EXERCISE PROGRAM: [facility] tasks.    GOALS: Goals reviewed with patient? Yes  SHORT TERM GOALS: Target date: 09/30/21  Pt will be indep to perform HEP for increasing strength and coordination in B hands.  Baseline: HEP not yet initiated Goal status: INITIAL  2.  Pt will be indep to verbalize 3 fall prevention strategies to implement during ADLs.  Baseline: Not yet initiated Goal status: INITIAL    3 LONG TERM GOALS: 11/11/21  Pt will increase FOTO score by 3 or more points to indicate increased perceived functional performance with daily tasks.  Baseline: FOTO 60 Goal status: INITIAL  2.  Pt will be able to efficiently send short text messages to family/friends with modified techniques as needed (ie: use of stylus, voice to text) Baseline: Spouse sends texts for  pt Goal status: INITIAL  3.  Pt will fill out a check for bill paying with 90% legibility with adapted pen as needed. Baseline: Pt reports inconsistent legibility Goal status: INITIAL  4.  Pt will increase efficiency with dressing tasks, donning clothes in 15 min or less. Baseline: Spouse reports it can take pt 20-30 min to dress in the morning Goal status: INITIAL    ASSESSMENT:  CLINICAL IMPRESSION:   Pt. was able to complete 6 simulated checks with 75% legibility. Pt. required increased time to complete the checks. Pt. was able to consistently maintain the same letter size throughout the check writing task. Pt. required visual cues, and cues for visual demonstration for [facility], and translatory movements of the hand. Pt. Education was provided about proximal stabilization techniques for improved distal control, and compensatory strategies for the tremors. Pt. Education was also provided about wrist weights, and weighted items to assist  with ADLs.   PERFORMANCE DEFICITS in functional skills including ADLs, IADLs, coordination, dexterity, ROM, strength, pain, flexibility, FMC, and GMC, cognitive skills including safety awareness.  IMPAIRMENTS are limiting patient from ADLs, IADLs, and leisure.   COMORBIDITIES has co-morbidities such as scoliosis, chronic back pain and leg pain  that affects occupational performance. Patient will benefit from skilled OT to address above impairments and improve overall function.  MODIFICATION OR ASSISTANCE TO COMPLETE EVALUATION: No modification of tasks or assist necessary to complete an evaluation.  OT OCCUPATIONAL PROFILE AND HISTORY: Problem focused assessment: Including review of records relating to presenting problem.  CLINICAL DECISION MAKING: Moderate - several treatment options, min-mod task modification necessary  REHAB POTENTIAL: Good  EVALUATION COMPLEXITY: Moderate    PLAN: OT FREQUENCY: 2x/week  OT DURATION: 12 weeks  PLANNED INTERVENTIONS: self care/ADL training, therapeutic exercise, therapeutic activity, neuromuscular re-education, manual therapy, passive range of motion, balance training, functional mobility training, moist heat, cryotherapy, patient/family education, cognitive remediation/compensation, energy conservation, coping strategies training, and DME and/or AE instructions  RECOMMENDED OTHER SERVICES: N/A  CONSULTED AND AGREED WITH PLAN OF CARE: Patient and family member/caregiver  PLAN FOR NEXT SESSION: See above  Harrel Carina, MS, OTR/L   Harrel Carina, OT 09/03/2021, 6:15 PM

## 2021-09-04 ENCOUNTER — Ambulatory Visit: Payer: Medicare HMO | Admitting: Speech Pathology

## 2021-09-07 NOTE — Progress Notes (Signed)
PROVIDER NOTE: Information contained herein reflects review and annotations entered in association with encounter. Interpretation of such information and data should be left to medically-trained personnel. Information provided to patient can be located elsewhere in the medical record under "Patient Instructions". Document created using STT-dictation technology, any transcriptional errors that may result from process are unintentional.    Patient: Joseph Arroyo  Service Category: E/M  Provider: Oswaldo Done, MD  DOB: 09-Jan-1941  DOS: 09/11/2021  Specialty: Interventional Pain Management  MRN: [id number]  Setting: Ambulatory outpatient  PCP: Smitty Cords, DO  Type: Established Patient    Referring Provider: Saralyn Pilar *  Location: Office  Delivery: Face-to-face     HPI  [name], a 81 y.o. year old male, is here today because of his Chronic bilateral low back pain without sciatica [M54.50, G89.29]. [name]'s primary complain today is Back Pain (lower) Last encounter: My last encounter with him was on 08/26/2021. Pertinent problems: [name] has Parkinson's disease (HCC); Chronic knee pain (Left); Derangement of medial meniscus, posterior horn (Left); PAD (peripheral artery disease) (HCC); Chronic pain syndrome; Grade 1 Retrolisthesis of L2/L3 (5 mm) and L3/L4 (3 mm); Levoscoliosis of lumbar spine (L3-4 apex); DDD (degenerative disc disease), lumbosacral; Lumbosacral facet arthropathy (Left: L3-4, L4-5, and L5-S1); Tricompartment osteoarthritis of knee (Left); Baker cyst (Left); Chronic low back pain (1ry area of Pain) (Bilateral) (R>L) w/o sciatica; Lumbar facet syndrome (Bilateral); Chronic lower extremity pain (2ry area of Pain) (Bilateral) (L>R); Lumbosacral radiculitis/sensory radiculopathy at L2 (Bilateral); Lumbosacral radiculitis/sensory radiculopathy at L3 (Bilateral); Abnormal MRI, lumbar spine (04/23/2021); Lumbosacral facet hypertrophy;  Lumbar central spinal stenosis, w/o neurogenic claudication (L4-5); Lumbar lateral recess stenosis (Bilateral: L2-3, L4-5) (Right: L3-4); Lumbosacral foraminal stenosis (Bilateral: L2-3, L3-4) (Left: L5-S1); Lumbar nerve root impingement (Right: L3 at L2-3 & L3-4); Ligamentum flavum hypertrophy (L3-4, L4-5); Spondylosis without myelopathy or radiculopathy, lumbosacral region; and Cervicalgia on their pertinent problem list. Pain Assessment: Severity of Chronic pain is reported as a 5 /10. Location: Back Right, Left/Denies. Onset:  . Quality:  . Timing:  . Modifying factor(s): meds,ice, heat. Vitals:  height is 5\' 7"  (1.702 m) and weight is 126 lb (57.2 kg). His temperature is 97.3 F (36.3 C) (abnormal). His blood pressure is 95/62 and his pulse is 57 (abnormal). His oxygen saturation is 98%.   Reason for encounter: post-procedure evaluation and assessment.  According to the patient's initial evaluation on 03/24/2021, his primary area of pain used to be his lower back (Bilateral) (R>L), followed by his lower extremity pain (Bilateral) (L>R).  His left lower extremity pain was described to run through the anterior portion of his thigh down to the knee and what appeared to be an L2/L3 dermatomal distribution.  In the case of the right lower extremity the distribution of the pain was exactly the same as on the right side, but just not as bad.  Finally his third area of pain was that of his left heel which they assumed was secondary to having some cracked skin over his heel.  The patient and his wife did not initially make the connection between the pain in the back and that of the lower extremities and the pain and states he will as being all related.  Today I spent over 45 minutes with the patient and his wife going over the role of the local anesthetic and steroids and the diagnostic injections and how we interpret the results.  Initially he had reported that after his right-sided L1-2  LESI, he had attained  100% relief of his pain for the duration of the local anesthetic followed by a return of 50% of his original pain after the local anesthetic wore off and then 0 benefit after 1 day.  However, when I took the time to ask the correct questions and inquire about each one of his pain areas it turns out that he has an ongoing 100% relief of his lower extremity pain and the only pain that has returned is that of his lower back.  I took the time to review the results of his imaging studies where there 04/23/2021 MRI indicated that he has a 5 mm retrolisthesis between the L2 and L3 as well as a 3 mm retrolisthesis between the L3 and L4 vertebral bodies.  The first 1 if it was causing some degree of foraminal stenosis would be affecting the L2 nerve root which in turn would give the patient pain, numbness, or weakness in the distribution of the L2 nerve root, meaning the area of the thighs and hip going down below the level of the knee.  In the case of the retrolisthesis involving the L3/L4 vertebral bodies, at that level, foraminal stenosis would be causing problems with the L3 in which case the pain could run down through the anterior thigh, hip area, knee area.  However, the patient is not having any of those symptoms anymore.  This same MRI indicates some central spinal stenosis at the L4-5 level which if significant could cause problems from the elbow for level down.  The same MRI also indicates that there is some foraminal stenosis on the right side at the L3-4 level which would affect the L3 nerve root, on the left side at the L5-S1 level which would affect the L5 nerve root on the left side.  Again he is not having any of those symptoms.  What he is having is pain in the lower back (Bilateral) (R=L).  He is no longer having any symptoms in the lower extremities again suggesting that the epidurals completely eliminated the radicular component of his condition.  In reviewing what we have done for him, his wife  noticed and voiced that she had noticed that his pain improved the best after having had a diagnostic bilateral lumbar facet block.  This does make a lot of sense since everything points at facet disease as the cause of his low back pain.  At this point the plan is to have him return for a diagnostic bilateral lumbar facet block #2 under fluoroscopic guidance.  The above plan will was shared with the patient and his wife who understood and accepted.  If the patient does get good relief of the pain for the duration of the local anesthetic, we will consider moving onto a radiofrequency ablation for the purpose of extending that same relief.  Post-procedure evaluation   Type: Lumbar epidural steroid injection (LESI) (interlaminar) #1    Laterality: Right   Level:  L1-2 Level.  Imaging: Fluoroscopic guidance Anesthesia: Local anesthesia (1-2% Lidocaine) Anxiolysis: None                 Sedation: None. DOS: 08/26/2021  Performed by: Oswaldo Done, MD  Purpose: Diagnostic/Therapeutic Indications: Lumbar radicular pain of intraspinal etiology of more than 4 weeks that has failed to respond to conservative therapy and is severe enough to impact quality of life or function. 1. Lumbosacral radiculitis/sensory radiculopathy at L2 (Bilateral)   2. DDD (degenerative disc disease), lumbosacral  3. Grade 1 Retrolisthesis of L2/L3 (5 mm) and L3/L4 (3 mm)   4. Lumbar lateral recess stenosis (Bilateral: L2-3, L4-5) (Right: L3-4)   5. Lumbar nerve root impingement (Right: L3 at L2-3 & L3-4)   6. Lumbosacral foraminal stenosis (Bilateral: L2-3, L3-4) (Left: L5-S1)   7. Chronic low back pain (1ry area of Pain) (Bilateral) (R>L) w/o sciatica   8. Abnormal MRI, lumbar spine (04/23/2021)    NAS-11 Pain score:   Pre-procedure: 4 /10   Post-procedure: 0-No pain/10      Effectiveness:  Initial hour after procedure: 100 %. Subsequent 4-6 hours post-procedure: 100 %. Analgesia past initial 6 hours: 50 %  (lasted for 1 day). Ongoing improvement:  Analgesic: Currently the patient indicates having an ongoing 100% relief of his lower extremity pain.  He is no longer having any of the anterior thigh to knee pain or heel pain that he had described on his initial evaluation.  This seems to have completely gone away with the epidural steroid injections. Function: Somewhat improved. ROM: Somewhat improved.  Pharmacotherapy Assessment  Analgesic: None MME/day: 0 mg/day   Monitoring: [place] PMP: PDMP reviewed during this encounter.       Pharmacotherapy: No side-effects or adverse reactions reported. Compliance: No problems identified. Effectiveness: Clinically acceptable.  Brigitte Pulse, RN  09/11/2021  1:36 PM  Sign when Signing Visit Safety precautions to be maintained throughout the outpatient stay will include: orient to surroundings, keep bed in low position, maintain call bell within reach at all times, provide assistance with transfer out of bed and ambulation.     UDS:  Summary  Date Value Ref Range Status  03/25/2021 Note  Final    Comment:    ==================================================================== Compliance Drug Analysis, Ur ==================================================================== Test                             Result       Flag       Units  Drug Present and Declared for Prescription Verification   Gabapentin                     PRESENT      EXPECTED   Baclofen                       PRESENT      EXPECTED   Paroxetine                     PRESENT      EXPECTED   Ibuprofen                      PRESENT      EXPECTED  Drug Present not Declared for Prescription Verification   Acetaminophen                  PRESENT      UNEXPECTED  Drug Absent but Declared for Prescription Verification   Alprazolam                     Not Detected UNEXPECTED ng/mg creat ==================================================================== Test                      Result     Flag   Units      Ref Range   Creatinine  122              mg/dL      >=62 ==================================================================== Declared Medications:  The flagging and interpretation on this report are based on the  following declared medications.  Unexpected results may arise from  inaccuracies in the declared medications.   **Note: The testing scope of this panel includes these medications:   Alprazolam  Baclofen (Lioresal)  Gabapentin (Neurontin)  Paroxetine (Paxil)   **Note: The testing scope of this panel does not include small to  moderate amounts of these reported medications:   Ibuprofen (Advil)   **Note: The testing scope of this panel does not include the  following reported medications:   Betamethasone (Lotrisone)  Carbidopa (Sinemet)  Clotrimazole (Lotrisone)  Folic Acid  Levodopa (Sinemet)  Lutein  Magnesium  Omega-3 Fatty Acids  Pramipexole (Mirapex)  Supplement  Ubiquinone (CoQ10)  Vitamin B ==================================================================== For clinical consultation, please call [phone]. ====================================================================      ROS  Constitutional: Denies any fever or chills Gastrointestinal: No reported hemesis, hematochezia, vomiting, or acute GI distress Musculoskeletal: Denies any acute onset joint swelling, redness, loss of ROM, or weakness Neurological: No reported episodes of acute onset apraxia, aphasia, dysarthria, agnosia, amnesia, paralysis, loss of coordination, or loss of consciousness  Medication Review  ALPRAZolam, B Complex Vitamins, Capsicum-Garlic, [place]thorn, L-Methylfolate, Lutein, Magnesium, PARoxetine, Psyllium, Saw Palmetto (Serenoa repens), acetaminophen, carbidopa-levodopa, cloNIDine, clotrimazole-betamethasone, gabapentin, omega-3 acid ethyl esters, and pramipexole  History Review  Allergy: [name] is allergic to prednisone and wheat  bran. Drug: [name]  reports no history of drug use. Alcohol:  reports no history of alcohol use. Tobacco:  reports that he has never smoked. He has never used smokeless tobacco. Social: Mr. Huskisson  reports that he has never smoked. He has never used smokeless tobacco. He reports that he does not drink alcohol and does not use drugs. Medical:  has a past medical history of Allergic rhinitis due to pollen (11/21/2007), Brachial neuritis or radiculitis NOS, Cervicalgia, Concussion, Costal chondritis, Depression, Essential and other specified forms of tremor, GERD (gastroesophageal reflux disease), Lumbago, Occlusion and stenosis of carotid artery without mention of cerebral infarction, and Seizures (HCC). Surgical: [name]  has a past surgical history that includes Other surgical history (2002); Colonoscopy (2008); Stapedes surgery (Right, 2002); Cataract extraction (Right, 07/18/14); and Cataract extraction w/PHACO (Left, 08/01/2014). Family: family history includes Asthma in his father; Heart failure in his mother.  Laboratory Chemistry Profile   Renal Lab Results  Component Value Date   BUN 20 03/24/2021   CREATININE 0.79 03/24/2021   BCR NOT APPLICABLE 09/23/2020   GFRAA 100 09/07/2019   GFRNONAA >60 03/24/2021    Hepatic Lab Results  Component Value Date   AST 18 03/24/2021   ALT <5 03/24/2021   ALBUMIN 4.1 03/24/2021   ALKPHOS 51 03/24/2021    Electrolytes Lab Results  Component Value Date   NA 131 (L) 03/24/2021   K 3.9 03/24/2021   CL 94 (L) 03/24/2021   CALCIUM 9.1 03/24/2021   MG 2.0 03/24/2021    Bone Lab Results  Component Value Date   VD25OH 44 09/23/2020   25OHVITD1 32 03/24/2021   25OHVITD2 <1.0 03/24/2021   25OHVITD3 31 03/24/2021    Inflammation (CRP: Acute Phase) (ESR: Chronic Phase) Lab Results  Component Value Date   CRP 0.5 03/24/2021   ESRSEDRATE 4 03/24/2021         Note: Above Lab results reviewed.  Recent Imaging Review  DG PAIN  CLINIC C-ARM 1-60 MIN NO REPORT Fluoro was used, but no Radiologist interpretation will be provided.  Please refer to "NOTES" tab for provider progress note. Note: Reviewed        Physical Exam  General appearance: Well nourished, well developed, and well hydrated. In no apparent acute distress Mental status: Alert, oriented x 3 (person, place, & time)       Respiratory: No evidence of acute respiratory distress Eyes: PERLA Vitals: BP 95/62   Pulse (!) 57   Temp (!) 97.3 F (36.3 C)   Ht 5\' 7"  (1.702 m)   Wt 126 lb (57.2 kg)   SpO2 98%   BMI 19.73 kg/m  BMI: Estimated body mass index is 19.73 kg/m as calculated from the following:   Height as of this encounter: 5\' 7"  (1.702 m).   Weight as of this encounter: 126 lb (57.2 kg). Ideal: Ideal body weight: 66.1 kg (145 lb 11.6 oz)  Assessment   Diagnosis Status  1. Chronic low back pain (1ry area of Pain) (Bilateral) (R>L) w/o sciatica   2. Chronic lower extremity pain (2ry area of Pain) (Bilateral) (L>R)   3. Lumbosacral radiculitis/sensory radiculopathy at L2 (Bilateral)   4. Grade 1 Retrolisthesis of L2/L3 (5 mm) and L3/L4 (3 mm)   5. Parkinson's disease (HCC)   6. Lumbosacral facet hypertrophy   7. Lumbosacral facet arthropathy (Left: L3-4, L4-5, and L5-S1)   8. Abnormal MRI, lumbar spine (04/23/2021)   9. DDD (degenerative disc disease), lumbosacral    Controlled Controlled Controlled   Updated Problems: No problems updated.  Plan of Care  Problem-specific:  No problem-specific Assessment & Plan notes found for this encounter.  [name] has a current medication list which includes the following long-term medication(s): clonidine, gabapentin, omega-3 acid ethyl esters, paroxetine, and paroxetine.  Pharmacotherapy (Medications Ordered): No orders of the defined types were placed in this encounter.  Orders:  Orders Placed This Encounter  Procedures   LUMBAR FACET(MEDIAL BRANCH NERVE BLOCK) MBNB     Standing Status:   Future    Standing Expiration Date:   12/12/2021    Scheduling Instructions:     Procedure: Lumbar facet block (AKA.: Lumbosacral medial branch nerve block)     Side: Bilateral     Level: L3-4 & L5-S1 Facets (L2, L3, L4, L5, & S1 Medial Branch Nerves)     Sedation: With Sedation.     Timeframe: ASAP    Order Specific Question:   Where will this procedure be performed?    Answer:   [facility]   Follow-up plan:   Return for procedure (ECT): (B) L-FCT Blk #2.     Interventional Therapies  Risk  Complexity Considerations:   Estimated body mass index is 20.98 kg/m as calculated from the following:   Height as of this encounter: 5\' 8"  (1.727 m).   Weight as of this encounter: 138 lb (62.6 kg). Parkinson's  SENSITIVITY: Oral Prednisone (Hyperactivity)   Planned  Pending:      Under consideration:   Diagnostic bilateral L2-3 & L3-4 lumbar facet MBB #1  Diagnostic x-rays of the cervical spine for further evaluation of his cervicalgia.   Completed:   Diagnostic bilateral lumbar facet (L2-S1) MBB x1 (07/08/2021) (50/50/50/<50)  Diagnostic right L1-2 LESI x1 (08/26/2021) (100/100/50/50)  Diagnostic right L2-3 LESI x1 (06/03/2021) (100/100/45/45)  Referral to physical therapy for LB PT as well as a back brace eval. (04/28/2021) (water aerobics)    Therapeutic  Palliative (PRN) options:  None established    Recent Visits Date Type Provider Dept  08/26/21 Procedure visit Delano Metz, MD [facility]  08/05/21 Office Visit Delano Metz, MD [facility]  07/08/21 Procedure visit Delano Metz, MD [facility]  06/24/21 Office Visit Delano Metz, MD [facility]  Showing recent visits within past 90 days and meeting all other requirements Today's Visits Date Type Provider Dept  09/11/21 Office Visit Delano Metz, MD [facility]  Showing today's visits and meeting all other  requirements Future Appointments No visits were found meeting these conditions. Showing future appointments within next 90 days and meeting all other requirements  I discussed the assessment and treatment plan with the patient. The patient was provided an opportunity to ask questions and all were answered. The patient agreed with the plan and demonstrated an understanding of the instructions.  Patient advised to call back or seek an in-person evaluation if the symptoms or condition worsens.  Duration of encounter: 52 minutes.  Total time on encounter, as per AMA guidelines included both the face-to-face and non-face-to-face time personally spent by the physician and/or other qualified health care professional(s) on the day of the encounter (includes time in activities that require the physician or other qualified health care professional and does not include time in activities normally performed by clinical staff). Physician's time may include the following activities when performed: preparing to see the patient (eg, review of tests, pre-charting review of records) obtaining and/or reviewing separately obtained history performing a medically appropriate examination and/or evaluation counseling and educating the patient/family/caregiver ordering medications, tests, or procedures referring and communicating with other health care professionals (when not separately reported) documenting clinical information in the electronic or other health record independently interpreting results (not separately reported) and communicating results to the patient/ family/caregiver care coordination (not separately reported)  Note by: Oswaldo Done, MD Date: 09/11/2021; Time: 5:14 PM

## 2021-09-09 ENCOUNTER — Ambulatory Visit: Payer: Medicare HMO

## 2021-09-09 ENCOUNTER — Encounter: Payer: Medicare HMO | Admitting: Occupational Therapy

## 2021-09-09 ENCOUNTER — Ambulatory Visit (INDEPENDENT_AMBULATORY_CARE_PROVIDER_SITE_OTHER): Payer: Medicare HMO | Admitting: Psychiatry

## 2021-09-09 ENCOUNTER — Encounter: Payer: Self-pay | Admitting: Psychiatry

## 2021-09-09 DIAGNOSIS — G894 Chronic pain syndrome: Secondary | ICD-10-CM | POA: Diagnosis not present

## 2021-09-09 DIAGNOSIS — F331 Major depressive disorder, recurrent, moderate: Secondary | ICD-10-CM | POA: Diagnosis not present

## 2021-09-09 DIAGNOSIS — F411 Generalized anxiety disorder: Secondary | ICD-10-CM

## 2021-09-09 DIAGNOSIS — R69 Illness, unspecified: Secondary | ICD-10-CM | POA: Diagnosis not present

## 2021-09-09 NOTE — Progress Notes (Signed)
Joseph Arroyo [id number] 10/20/1940 81 y.o.  Virtual Visit via Telephone Note  I connected with pt by telephone and verified that I am speaking with the correct person using two identifiers.   I discussed the limitations, risks, security and privacy concerns of performing an evaluation and management service by telephone and the availability of in person appointments. I also discussed with the patient that there may be a patient responsible charge related to this service. The patient expressed understanding and agreed to proceed.  I discussed the assessment and treatment plan with the patient. The patient was provided an opportunity to ask questions and all were answered. The patient agreed with the plan and demonstrated an understanding of the instructions.   The patient was advised to call back or seek an in-person evaluation if the symptoms worsen or if the condition fails to improve as anticipated.  I provided 30 minutes of non-face-to-face time during this encounter. The patient was located at home and the provider was located office. Session 1100-1130 am.  Subjective:   Patient ID:  Joseph Arroyo is a 81 y.o. (DOB Jul 30, 1940) male.  Chief Complaint:  Chief Complaint  Patient presents with   Follow-up   Anxiety   Depression    Depression        Associated symptoms include no fatigue.  Past medical history includes anxiety.   Anxiety Patient reports no dizziness or palpitations.     Morse ( lor-on) presents to the office today for follow-up of major depression and generalized anxiety disorder.    He was seen December, 2020 & June 2021.  No meds were changed.  Continued paroxetine 60 mg daily plus gabapentin 100 mg twice daily and Mirapex 0.125 mg 3 times daily.   08/2019 appt without med changes noted: Tolerates the meds well.  Wife thinks he's doing very well and he agrees.   No complaints.  2 D's.   Still exercising and has fatigue but can  function and enjoys activity. Restarted men's Bible study.  He feels positive and hopeful and useful.  At some point would like to be free of medication.  Does not feel guilty about the meds.    03/19/2020 appt noted:  Seen with wife Joseph Arroyo and took Ivermectin and both had it mild. As far mood concerned he's doing well.  Wife agrees.  Not markedly depressed or anxious.  Feels the suffering of others.  Joseph Arroyo in hospital in [place].  Friend with Covid also. No episodes. Tolerating meds.  No Xanax used.  Attend [employer] in [place]. Usually sleep well but back pain can interfere with sleep. TX for PD and a bit slower per wife. Plan: Try increasing gabapentin to 100 mg AM & 200 mg PM for back pain.   Continue paroxetine 60 mg daily and Deplin 15 mg daily  10/03/2020 appointment with the following noted: Seen with wife, Joseph Arroyo Off and on increased gabapentin at PM to 200 mg for back pain and sleeps better.   Not depressed.  Anxiety is manageable.   Seeing chiropracter who he likes [name] in [place]. No SE noted with meds.  Perspire heavily. No panic attacks.   Sleep variable and is usually OK. Still leads men's Bible Study. Uses [employer] Plan: No med change  03/25/2021 appointment with the following noted: with wife Joseph Arroyo but came on quickly. Shaking with anxiety and been going on for 3-4 weeks. Rare use of Xanax. A lot of  physical px with constant pain with back. Pending workup.  Fell 3 weeks ago. T pain clinic now.  Everything seemed to pile up. Insomnia with mind racing with worry.  He can't tolerate negativity.   Rare panic. Consistent with paroxetine 60 mg daily. Consistent with gabapentin 100 AM and 200 PM Plan: For persistent symptoms of anxiety we will start off label trial of clonidine.  Cautioned about side effects.  05/13/2021 appointment with the following noted: wife Joseph Arroyo on call Taking clonidine 0.1 mg tablet 1/2 tablets twice  daily. Can't tell if there has been benefit from this.   Still taking Xanax but usually about one daily. Couldn't get here DT pain.  Been to pain clinic but no meds yet.  Problems with insurance company giving approval for the tests.  At times pain is excruciating.   BP has been OK.   Being cautious about getting up and protecting from dizziness. Tremors will wax and wane and worse with stress.   Fighting over the idea God does not want him to be like this and feels there is something wrong with him.   No SI  "that's not an option".   Plan: Continue paroxetine 60 mg daily DT PD hesitate to use atypicals for TR anxiety. Consider increasing gabapentin for anxiety off label.  Yes increase to 200 mg BID Ok Xanax  Clonidine 0.1 mg tablets 1/2 tablet twice daily    07/03/2021 appointment with the following noted: seen with Joseph Arroyo on call Using Xanax prn 1-2 daily. Increased gabapentin 100 mg AM and noon and 200 mg PM; hard to tell if it helped any but it might have.  No SE noted. Feels fine today and thinks there's improvement.  Believe the Reita Cliche is bringing him through this. Epidural recently.  Some pain relief. Per wife has had some anxious moments.   Tremors vary.   Some PT, balance a little worse since Xmas.   No low BP. When sleep is fine but 2-3 hours at a time and nocturia. He thinks depression is a lot better, today I feel good.  Needs to get out socially.  He's a people person.  She thinks today is best day since Jan.   Plan: Consider increasing gabapentin for anxiety off label.  Yes increase to 200 mg BID  09/09/2021 appointment with following noted: with wife Joseph Arroyo Doing ok thank you. A lot better with depression and anxiety. Gabapentin 100 mg AM and noon and 200 HS. Gets sleepy in Am after breakfast with Sinemet.  Takes a nap. Sleep pretty well. Disc scamming but they have been careful. Wife notices anxiety and tremors reenforce each other.  Only one Xanax daily.  Wants to go out  daily. Not attending main church but can go to house church and is getting out more. No SE except sleepiness. 3 back pain procedures with some benefit. Started speech therapy and OT and he likes to be active.   Was 12 years free of medication until this last relapse and yet it took a long time to get relief again.  Wife, Joseph Arroyo, said overall he's been doing well.  No prn BZ in a couple of years.  Past Psychiatric Medication Trials: Under our care since 2013. Paroxetine 60, duloxetine, fluoxetine, Luvox, venlafaxine, Trintellix, ,  mirtazapine,doxepin,  Gabapentin 200 BID Deplin, clonazepam, lorazepam  lamotrigine,  buspirone,   Clonidine 0.05 BID Abilify, Seroquel, Zyprexa,  risperidone, Saphris,  lithium with some benefit,   pramipexole  Review of Systems:  Review  of Systems  Constitutional:  Negative for fatigue.  Cardiovascular:  Negative for palpitations.  Gastrointestinal:  Positive for diarrhea.  Musculoskeletal:  Positive for back pain and gait problem.  Neurological:  Positive for tremors and weakness. Negative for dizziness.  Psychiatric/Behavioral:  Negative for dysphoric mood.     Medications: I have reviewed the patient's current medications.  Current Outpatient Medications  Medication Sig Dispense Refill   acetaminophen (TYLENOL) 500 MG tablet Take 1,000 mg by mouth every 8 (eight) hours as needed.     ALPRAZolam (XANAX) 0.25 MG tablet Take 1 tablet (0.25 mg total) by mouth 3 (three) times daily as needed for anxiety. 90 tablet 1   B Complex Vitamins (VITAMIN B COMPLEX PO) Take 1 tablet by mouth daily.     Capsicum-Garlic 962-229 MG CAPS Take 200-300 mg by mouth.     carbidopa-levodopa (SINEMET IR) 25-100 MG tablet Take 2.5 tablets by mouth 3 (three) times daily.     cloNIDine (CATAPRES) 0.1 MG tablet 1/2 tablet twice daily 90 tablet 1   clotrimazole-betamethasone (LOTRISONE) cream Apply 1-2 times a day for worsening flare dry skin dermatitis of toes/feet, may  re-use daily up to 1 week as needed. 30 g 1   gabapentin (NEURONTIN) 100 MG capsule 2 CAPSULES twice daily (Patient taking differently: 1 in AM and noon and 2 at night) 270 capsule 1   HAWTHORNE BERRY PO Take by mouth daily.     L-Methylfolate 15 MG TABS Take 1 tablet (15 mg total) by mouth daily. 90 tablet 3   Lutein 10 MG TABS Take 1 tablet by mouth 3 (three) times daily. 1 daily     Magnesium 200 MG TABS Take 200 mg by mouth daily.     omega-3 acid ethyl esters (LOVAZA) 1 g capsule Take 1 capsule by mouth daily.     PARoxetine (PAXIL) 20 MG tablet TAKE 1 TABLET BY MOUTH EVERY DAY (WITH THE 40 MG TABLET FOR 60 MG TOTAL) 90 tablet 1   PARoxetine (PAXIL) 40 MG tablet TAKE 1 TABLET BY MOUTH EVERY DAY (WITH THE 20 MG TABLET FOR 60 MG TOTAL) 90 tablet 1   pramipexole (MIRAPEX) 0.125 MG tablet Take 0.125 mg by mouth 2 (two) times daily. 1 tab HS     PSYLLIUM HUSK PO Take by mouth.     Saw Palmetto, Serenoa repens, 1000 MG CAPS Take 2 capsules by mouth daily.     baclofen (LIORESAL) 10 MG tablet TAKE 1/2-1 TABLET BY MOUTH TWICE A DAY AS NEEDED FOR MUSCLE SPASMS (Patient not taking: Reported on 08/05/2021) 180 tablet 1   co-enzyme Q-10 30 MG capsule Take 30 mg by mouth daily. (Patient not taking: Reported on 09/09/2021)     ibuprofen (ADVIL,MOTRIN) 200 MG tablet Take 200 mg by mouth every 6 (six) hours as needed for moderate pain. (Patient not taking: Reported on 08/05/2021)     No current facility-administered medications for this visit.    Medication Side Effects: Fatigue and Other: from Mirapex  esp in AM  Allergies:  Allergies  Allergen Reactions   Prednisone     Hyperactivity    Wheat Bran Diarrhea    Past Medical History:  Diagnosis Date   Allergic rhinitis due to pollen 11/21/2007   Brachial neuritis or radiculitis NOS    Cervicalgia    Concussion    age 31 - s/p accident   Costal chondritis    Depression    Essential and other specified forms of tremor  GERD (gastroesophageal  reflux disease)    in past   Lumbago    Occlusion and stenosis of carotid artery without mention of cerebral infarction    Seizures [facility])    age 64 - after concussion    Family History  Problem Relation Age of Onset   Heart failure Mother        enlarged heart    Asthma Father     Social History   Socioeconomic History   Marital status: Married    Spouse name: Joseph Arroyo Massingale   Number of children: 2   Years of education: 12   Highest education level: Master's degree (e.g., MA, MS, MEng, MEd, MSW, MBA)  Occupational History   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[place])    Employer: RETIRED  Tobacco Use   Smoking status: Never   Smokeless tobacco: Never  Vaping Use   Vaping Use: Never used  Substance and Sexual Activity   Alcohol use: No    Alcohol/week: 0.0 standard drinks of alcohol   Drug use: No   Sexual activity: Not Currently  Other Topics Concern   Not on file  Social History Narrative   Married to [place], has 2 children, has grandchildren   Right handed   Master's plus   He is born in [place], heritage is [place] (Father's side), [place] (Mother's side)      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    Social Determinants of Health   Financial Resource Strain: Low Risk  (09/19/2019)   Overall Financial Resource Strain (CARDIA)    Difficulty of Paying Living Expenses: Not hard at all  Food Insecurity: No Food Insecurity (09/19/2019)   Hunger Vital Sign    Worried About Running Out of Food in the Last Year: Never true    [place] in the Last Year: Never true  Transportation Needs: No Transportation Needs (09/19/2019)   PRAPARE - Hydrologist (Medical): No    Lack of Transportation (Non-Medical): No  Physical Activity: Sufficiently Active (09/19/2019)   Exercise Vital Sign    Days of Exercise per Week: 3 days    Minutes of Exercise per Session: 90 min  Stress: No Stress Concern Present (09/19/2019)   Sneads    Feeling of Stress : Not at all  Social Connections: [place] (08/24/2017)   Social Connection and Isolation Panel [NHANES]    Frequency of Communication with Friends and Family: More than three times a week    Frequency of Social Gatherings with Friends and Family: More than three times a week    Attends Religious Services: More than 4 times per year    Active Member of [employer] or Organizations: Yes    Attends Archivist Meetings: More than 4 times per year    Marital Status: Married  Human resources officer Violence: Not At Risk (08/24/2017)   Humiliation, Afraid, Rape, and Kick questionnaire    Fear of Current or Ex-Partner: No    Emotionally Abused: No    Physically Abused: No    Sexually Abused: No    Past Medical History, Surgical history, Social history, and Family history were reviewed and updated as appropriate.   Please see review of systems for further details on the patient's review from today.   Objective:   Physical Exam:  There were no vitals taken for this visit.  Physical Exam Neurological:     Mental Status: He is alert and  oriented to person, place, and time.     Cranial Nerves: No dysarthria.  Psychiatric:        Attention and Perception: Attention and perception normal.        Mood and Affect: Mood is anxious. Mood is not depressed.        Speech: Speech normal.        Behavior: Behavior is cooperative.        Thought Content: Thought content normal. Thought content is not paranoid or delusional. Thought content does not include homicidal or suicidal ideation. Thought content does not include suicidal plan.        Cognition and Memory: Cognition and memory normal.        Judgment: Judgment normal.     Comments: Insight intact     Lab Review:     Component Value Date/Time   NA 131 (L) 03/24/2021 1351   NA 143 07/10/2015 0821   K 3.9 03/24/2021 1351   CL 94 (L) 03/24/2021 1351   CO2 27  03/24/2021 1351   GLUCOSE 97 03/24/2021 1351   BUN 20 03/24/2021 1351   BUN 13 07/10/2015 0821   CREATININE 0.79 03/24/2021 1351   CREATININE 0.76 09/23/2020 0911   CALCIUM 9.1 03/24/2021 1351   PROT 7.1 03/24/2021 1351   PROT 6.8 07/10/2015 0821   ALBUMIN 4.1 03/24/2021 1351   ALBUMIN 4.2 07/10/2015 0821   AST 18 03/24/2021 1351   ALT <5 03/24/2021 1351   ALKPHOS 51 03/24/2021 1351   BILITOT 0.8 03/24/2021 1351   BILITOT 0.5 07/10/2015 0821   GFRNONAA >60 03/24/2021 1351   GFRNONAA 86 09/07/2019 0941   GFRAA 100 09/07/2019 0941       Component Value Date/Time   WBC 6.5 09/23/2020 0911   RBC 4.86 09/23/2020 0911   HGB 15.2 09/23/2020 0911   HGB 15.3 10/23/2014 1237   HCT 45.2 09/23/2020 0911   HCT 45.1 10/23/2014 1237   PLT 183 09/23/2020 0911   PLT 150 10/23/2014 1237   MCV 93.0 09/23/2020 0911   MCV 88 10/23/2014 1237   MCH 31.3 09/23/2020 0911   MCHC 33.6 09/23/2020 0911   RDW 12.2 09/23/2020 0911   RDW 13.3 10/23/2014 1237   LYMPHSABS 1,411 09/23/2020 0911   LYMPHSABS 1.7 10/23/2014 1237   MONOABS 357 07/24/2016 1050   EOSABS 371 09/23/2020 0911   EOSABS 0.3 10/23/2014 1237   BASOSABS 72 09/23/2020 0911   BASOSABS 0.1 10/23/2014 1237    No results found for: "POCLITH", "LITHIUM"   No results found for: "PHENYTOIN", "PHENOBARB", "VALPROATE", "CBMZ"   .res Assessment: Plan:    Brexton was seen today for follow-up, anxiety and depression.  Diagnoses and all orders for this visit:  GAD (generalized anxiety disorder)  Major depressive disorder, recurrent episode, moderate (HCC)  Chronic pain syndrome    History of extremely severe TR depression and anxiety with multiple med failures.  Doing well for 2-3 years.    Unfortunately he has had a severe relapse of anxiety over the last several weeks.  It is contributed to by pain and advancing Parkinson's disease.  He has been consistent with the paroxetine  Still works out at gym.  Disc pricing  L-methylfolate with [facility]. Continue paroxetine 60 mg daily Cannot increase paroxetine right now  Parkiinson's is better with meds. DT PD hesitate to use atypicals for TR anxiety.  Consider nortriptyline because he is never been on a tricyclic and this particular tricyclic has the least risk in the  elderly of the alternatives.  Continue gabapentin for anxiety off label and pain 200 mg BID  Ok Xanax 0.25 mg TID prn.  He's not using much We discussed the short-term risks associated with benzodiazepines including sedation and increased fall risk among others.  Discussed long-term side effect risk including dependence, potential withdrawal symptoms, and the potential eventual dose-related risk of dementia.  But recent studies from 2020 dispute this association between benzodiazepines and dementia risk. Newer studies in 2020 do not support an association with dementia.  Clonidine 0.1 mg tablets 1/2 tablet twice daily   Disc SE incl fall risk.  Push fluids.  Fall precautions.  This is off label but often provides fairly rapid relief of anxiety.  He has alprazolam but it does not provide adequate control  No med changes today.  Tolerating and benefitting.' Disc fall risks.  FU 3-4 mos  Lynder Parents, MD, DFAPA   Please see After Visit Summary for patient specific instructions.  Future Appointments  Date Time Provider [place]  09/10/2021 11:45 AM Harrel Carina, OT [facility] None  09/10/2021 12:30 PM Overton, Happi, CCC-SLP [facility] None  09/11/2021  1:40 PM Milinda Pointer, MD [facility] None  09/12/2021  1:00 PM [id number]  09/16/2021  3:00 PM Overton, Happi, CCC-SLP [facility] None  09/16/2021  4:00 PM Harrel Carina, OT [facility] None  09/19/2021  8:00 AM Overton, Happi, CCC-SLP [facility] None  09/19/2021  8:45 AM Rivka Barbara B, PT [facility] None  09/23/2021  1:00 PM Overton, Happi, CCC-SLP [facility] None  09/25/2021  8:45 AM Janna Arch, PT  [facility] None  09/30/2021  1:00 PM Overton, Happi, CCC-SLP [facility] None  10/01/2021  9:30 AM Janna Arch, PT [facility] None  10/02/2021 11:45 AM Darleene Cleaver, OT [facility] None  10/07/2021  1:00 PM Overton, Happi, CCC-SLP [facility] None  10/07/2021  2:30 PM Darleene Cleaver, OT [facility] None  10/09/2021  4:00 PM Lewis Moccasin, PT [facility] None  10/14/2021  1:00 PM Overton, Happi, CCC-SLP [facility] None  10/14/2021  2:30 PM Darleene Cleaver, OT [facility] None  10/16/2021  1:00 PM Overton, Happi, CCC-SLP [facility] None  10/16/2021  2:30 PM Darleene Cleaver, OT [facility] None  10/21/2021  2:30 PM Darleene Cleaver, OT [facility] None  10/21/2021  3:15 PM Janna Arch, PT [facility] None  10/23/2021  1:00 PM Overton, Happi, CCC-SLP [facility] None  10/28/2021 10:00 AM Overton, Happi, CCC-SLP [facility] None  10/28/2021 11:00 AM Lewis Moccasin, PT [facility] None  10/30/2021 11:45 AM Gregary Cromer, Kathlee Nations, PT [facility] None  11/04/2021  1:00 PM Overton, Happi, CCC-SLP [facility] None  11/04/2021  2:30 PM Harrel Carina, OT [facility] None  11/06/2021 10:00 AM Overton, Happi, CCC-SLP [facility] None  11/06/2021 11:00 AM Janna Arch, PT [facility] None  11/11/2021 10:00 AM Overton, Happi, CCC-SLP [facility] None  11/11/2021 11:00 AM Janna Arch, PT [facility] None  11/13/2021 10:00 AM Overton, Happi, CCC-SLP [facility] None  11/13/2021 11:00 AM Janna Arch, PT [facility] None  11/18/2021 10:00 AM Overton, Happi, CCC-SLP [facility] None  11/18/2021 11:00 AM Harrel Carina, OT [facility] None  11/20/2021 10:00 AM Overton, Happi, CCC-SLP [facility] None  11/20/2021 11:00 AM Harrel Carina, OT [facility] None  11/25/2021 10:00 AM Overton, Happi, CCC-SLP [facility] None  11/25/2021 11:00 AM Janna Arch, PT [facility] None  11/27/2021 10:00 AM Overton, Happi, CCC-SLP [facility] None  11/27/2021 11:00 AM Harrel Carina, OT [facility] None    No orders of the defined types were placed in this  encounter.    -------------------------------

## 2021-09-10 ENCOUNTER — Ambulatory Visit: Payer: Medicare HMO | Admitting: Speech Pathology

## 2021-09-10 ENCOUNTER — Ambulatory Visit: Payer: Medicare HMO | Admitting: Occupational Therapy

## 2021-09-10 ENCOUNTER — Encounter: Payer: Self-pay | Admitting: Occupational Therapy

## 2021-09-10 DIAGNOSIS — R41841 Cognitive communication deficit: Secondary | ICD-10-CM | POA: Diagnosis not present

## 2021-09-10 DIAGNOSIS — R471 Dysarthria and anarthria: Secondary | ICD-10-CM

## 2021-09-10 DIAGNOSIS — R269 Unspecified abnormalities of gait and mobility: Secondary | ICD-10-CM | POA: Diagnosis not present

## 2021-09-10 DIAGNOSIS — R278 Other lack of coordination: Secondary | ICD-10-CM | POA: Diagnosis not present

## 2021-09-10 DIAGNOSIS — R2689 Other abnormalities of gait and mobility: Secondary | ICD-10-CM | POA: Diagnosis not present

## 2021-09-10 DIAGNOSIS — R490 Dysphonia: Secondary | ICD-10-CM | POA: Diagnosis not present

## 2021-09-10 DIAGNOSIS — M6281 Muscle weakness (generalized): Secondary | ICD-10-CM

## 2021-09-10 DIAGNOSIS — R1312 Dysphagia, oropharyngeal phase: Secondary | ICD-10-CM | POA: Diagnosis not present

## 2021-09-10 DIAGNOSIS — R2681 Unsteadiness on feet: Secondary | ICD-10-CM | POA: Diagnosis not present

## 2021-09-10 DIAGNOSIS — G20 Parkinson's disease: Secondary | ICD-10-CM

## 2021-09-10 DIAGNOSIS — R262 Difficulty in walking, not elsewhere classified: Secondary | ICD-10-CM | POA: Diagnosis not present

## 2021-09-10 NOTE — Therapy (Signed)
OUTPATIENT OCCUPATIONAL THERAPY NEURO TREATMENT  Patient Name: Joseph Arroyo MRN: [id number] DOB:September 28, 1940, 81 y.o., male Today's Date: 09/10/2021  PCP: Dr. Nobie Putnam REFERRING PROVIDER: Dr. Lavena Bullion   OT End of Session - 09/10/21 1711     Visit Number 3    Number of Visits 24    Date for OT Re-Evaluation 11/11/21    Authorization Time Period Reporting period beginning 08/19/21    OT Start Time 1145    OT Stop Time 1235    OT Time Calculation (min) 50 min    Equipment Utilized During Treatment RW    Activity Tolerance Patient tolerated treatment well    Behavior During Therapy [facility] for tasks assessed/performed             Past Medical History:  Diagnosis Date   Allergic rhinitis due to pollen 11/21/2007   Brachial neuritis or radiculitis NOS    Cervicalgia    Concussion    age 33 - s/p accident   Costal chondritis    Depression    Essential and other specified forms of tremor    GERD (gastroesophageal reflux disease)    in past   Lumbago    Occlusion and stenosis of carotid artery without mention of cerebral infarction    Seizures [facility])    age 59 - after concussion   Past Surgical History:  Procedure Laterality Date   CATARACT EXTRACTION Right 07/18/14   CATARACT EXTRACTION W/PHACO Left 08/01/2014   Procedure: CATARACT EXTRACTION PHACO AND INTRAOCULAR LENS PLACEMENT ([place]);  Surgeon: Leandrew Koyanagi, MD;  Location: [place];  Service: Ophthalmology;  Laterality: Left;  IVA TOPICAL   COLONOSCOPY  2008   OTHER SURGICAL HISTORY  2002   ear surgery   STAPEDES SURGERY Right 2002   Patient Active Problem List   Diagnosis Date Noted   Cervicalgia 08/05/2021   Spondylosis without myelopathy or radiculopathy, lumbosacral region 06/24/2021   Lumbosacral facet hypertrophy 06/03/2021   Lumbar central spinal stenosis, w/o neurogenic claudication (L4-5) 06/03/2021   Lumbar lateral recess stenosis (Bilateral: L2-3, L4-5) (Right: L3-4)  06/03/2021   Lumbosacral foraminal stenosis (Bilateral: L2-3, L3-4) (Left: L5-S1) 06/03/2021   Lumbar nerve root impingement (Right: L3 at L2-3 & L3-4) 06/03/2021   Ligamentum flavum hypertrophy (L3-4, L4-5) 06/03/2021   Abnormal MRI, lumbar spine (04/23/2021) 04/24/2021   Long term prescription benzodiazepine use (alprazolam) (Xanax) 03/24/2021   Grade 1 Retrolisthesis of L2/L3 (5 mm) and L3/L4 (3 mm) 03/24/2021   Levoscoliosis of lumbar spine (L3-4 apex) 03/24/2021   DDD (degenerative disc disease), lumbosacral 03/24/2021   Lumbosacral facet arthropathy (Left: L3-4, L4-5, and L5-S1) 03/24/2021   Tricompartment osteoarthritis of knee (Left) 03/24/2021   Baker cyst (Left) 03/24/2021   Chronic low back pain (1ry area of Pain) (Bilateral) (R>L) w/o sciatica 03/24/2021   Lumbar facet syndrome (Bilateral) 03/24/2021   Chronic lower extremity pain (2ry area of Pain) (Bilateral) (L>R) 03/24/2021   Lumbosacral radiculitis/sensory radiculopathy at L2 (Bilateral) 03/24/2021   Lumbosacral radiculitis/sensory radiculopathy at L3 (Bilateral) 03/24/2021   Chronic pain syndrome 03/23/2021   Pharmacologic therapy 03/23/2021   Disorder of skeletal system 03/23/2021   Problems influencing health status 03/23/2021   PAD (peripheral artery disease) ([place]) 10/01/2020   GAD (generalized anxiety disorder) 01/13/2018   MDD (major depressive disorder) 41/66/0630   Umbilical hernia 16/03/930   Chronic knee pain (Left) 08/11/2017   Derangement of medial meniscus, posterior horn (Left) 08/11/2017   Vitamin D insufficiency 03/05/2016   BPH without obstruction/lower urinary tract  symptoms 07/08/2015   Chronic fatigue 10/23/2014   Parkinson's disease ([place]) 09/20/2014   Major depression, recurrent, full remission ([place]) 07/26/2013   Unsteady gait 07/26/2013   Orthostatic hypotension 07/26/2013   Depression 07/26/2013   Anxiety 06/23/2013   Chronic anxiety 06/23/2013   Tremor 05/18/2013   Bilateral carotid  artery stenosis 08/30/2012    ONSET DATE: 08/11/21 date of referral.  PD symptoms since 2015.  REFERRING DIAG: Parkinson's Disease  THERAPY DIAG:  Muscle weakness (generalized)  Other lack of coordination  Rationale for Evaluation and Treatment Rehabilitation  SUBJECTIVE:   SUBJECTIVE STATEMENT: Pt. Reports having back pain today.  PERTINENT HISTORY: Per chart, pt with hx of Parkinson's disease ([place]); Chronic knee pain (Left); Derangement of medial meniscus, posterior horn (Left); PAD (peripheral artery disease) ([place]); Chronic pain syndrome; DDD (degenerative disc disease) lumbosacral; osteoarthritis of knee (Left); Chronic low back pain, Lumbar facet syndrome (Bilateral); Chronic lower extremity pain. PRECAUTIONS: Fall  WEIGHT BEARING RESTRICTIONS No  PAIN:  Are you having pain? Yes: NPRS scale: 6/10 Pain location: low back Pain description: achy Aggravating factors: N/A, constant Relieving factors: rest, repositioning  FALLS: Has patient fallen in last 6 months? Yes. Number of falls 1-2  LIVING ENVIRONMENT: Lives with: spouse  Lives in: 1 level home  Stairs: 4 steps, a landing, then 3 more steps with bilat rails.  Spouse reports church will likely be building them a ramp Has following equipment at home: Gilford Rile - 2 wheeled, Shower bench, bed side commode, and Grab bars  PLOF: Needs assistance with ADLs.  Pt performs basic self care with set up assist, extra time to manage clothing fasteners.  Pt can take his own shower and dress self, though efficiency is lacking; extra time needed.  Pt/spouse go to a local pool several times per week, and doffing a wet suit then dressing after can take up to 30 min.    PATIENT GOALS : Increase efficiency with daily tasks, increase FMC skills   OBJECTIVE:  HAND DOMINANCE: Right  ADLs: Overall ADLs: performs basic self care with set up, extra time, occasional assist with small clothing fasteners.   Transfers/ambulation related to ADLs:  supv-modified indep using RW. Eating: difficulty cutting food Grooming: modified indep, including shaving with electric razor and straight razor. UB Dressing: extra time to manage clothing fasteners, occasional assist with smaller buttons LB Dressing: extra time to don shoes Toileting: modified indep; 3in1 over toilet Bathing: modified indep, extra time.   Tub Shower transfers: transfer tub bench/modified indep. Equipment: Transfer tub bench, Grab bars, and bed side commode, RW   IADLs: Shopping: Spouse manages at baseline Light housekeeping: spouse manages at baseline Meal Prep: Spouse manages at [employer] mobility: RW, transport chair Medication management: modified Chief Executive Officer: pt requires extra time for writing checks, but pt manages finances independently. Handwriting: 100% legible and Increased time at time of eval, though pt admits this can fluctuate when fatigued.  MOBILITY STATUS: Hx of falls  POSTURE COMMENTS:  rounded shoulders, increased thoracic kyphosis, and hx of scoliosis , L shoulder rests significantly lower than R Sitting balance: Supports self with one extremity  ACTIVITY TOLERANCE: Activity tolerance: Pt reports dressing/bathing tasks cause fatigue, requiring extra time to complete.  Occasional need for transport chair for community mobility.   FUNCTIONAL OUTCOME MEASURES: FOTO: 60  UPPER EXTREMITY ROM     Active ROM Right eval Left eval  Shoulder flexion 135 150  Shoulder abduction 180 180  Shoulder adduction    Shoulder extension  Shoulder internal rotation    Shoulder external rotation WNL WNL  Elbow flexion    Elbow extension    Wrist flexion    Wrist extension    Wrist ulnar deviation    Wrist radial deviation    Wrist pronation    Wrist supination    (Blank rows = not tested)   UPPER EXTREMITY MMT:     MMT Right eval Left eval  Shoulder flexion 5/5 5/5  Shoulder abduction 5/5 5/5  Shoulder adduction     Shoulder extension    Shoulder internal rotation    Shoulder external rotation    Middle trapezius    Lower trapezius    Elbow flexion 5/5 5/5  Elbow extension 5/5 5/5  Wrist flexion 5/5 5/5  Wrist extension 5/5 5/5  Wrist ulnar deviation    Wrist radial deviation    Wrist pronation    Wrist supination    (Blank rows = not tested)  HAND FUNCTION: Grip strength: Right: 50 lbs; Left: 40 lbs, Lateral pinch: Right: 14 lbs, Left: 8 lbs, and 3 point pinch: Right: 11 lbs, Left: 9 lbs  COORDINATION: 9 Hole Peg test: Right: 34 sec; Left: 35 sec Tremors: Resting, action, Right, and Left; more pronounced on the L; mild at time of eval.   Pt takes Parkinson's meds 3x daily, 7a, 12p, 5 p  SENSATION: Not tested    MUSCLE TONE: RUE: Within functional limits and LUE: Within functional limits  COGNITION: Overall cognitive status: Pt appears to be a good historian, alert and oriented x4.  Seems to lack some awareness into deficits/safety as noted by some reported unrealistic functional goals.   VISION: Subjective report: No changes from baseline.  Baseline vision: Wears glasses for reading only Visual history: macular degeneration, hx of cataract removal both eyes; no report of diplopia   OBSERVATIONS: Pt presents with mild BUE coordination impairment, mild dyskinesia with resting hand tremor more pronounced on the L, mild also with activity.    TODAY'S TREATMENT:    Pt. performed 2# dumbbell ex. for elbow flexion and extension, forearm supination/pronation, wrist flexion/extension, and radial deviation. Pt. requires rest breaks and verbal cues for proper technique.  Pt. worked on [employer] ex. for hand strengthening. Exercises including: gross gripping, lateral, and 3pt. pinch strengthening, digit abduction, and thumb opposition. Pt. was provided with a visual handout HEP through [place]. There. Ex was performed to improve the UE strength needed for ADLs, and IADL  functioning.   PATIENT EDUCATION: Education details: Proximal stabilization techniques for improved distal control, compensatory strategies for tremors. Person educated: Patient and Spouse Education method: Explanation and Verbal cues Education comprehension: verbalized understanding   HOME EXERCISE PROGRAM: [facility] tasks.    GOALS: Goals reviewed with patient? Yes  SHORT TERM GOALS: Target date: 09/30/21  Pt will be indep to perform HEP for increasing strength and coordination in B hands.  Baseline: HEP not yet initiated Goal status: INITIAL  2.  Pt will be indep to verbalize 3 fall prevention strategies to implement during ADLs.  Baseline: Not yet initiated Goal status: INITIAL    3 LONG TERM GOALS: 11/11/21  Pt will increase FOTO score by 3 or more points to indicate increased perceived functional performance with daily tasks.  Baseline: FOTO 60 Goal status: INITIAL  2.  Pt will be able to efficiently send short text messages to family/friends with modified techniques as needed (ie: use of stylus, voice to text) Baseline: Spouse sends texts for pt Goal status: INITIAL  3.  Pt will fill out a check for bill paying with 90% legibility with adapted pen as needed. Baseline: Pt reports inconsistent legibility Goal status: INITIAL  4.  Pt will increase efficiency with dressing tasks, donning clothes in 15 min or less. Baseline: Spouse reports it can take pt 20-30 min to dress in the morning Goal status: INITIAL    ASSESSMENT:  CLINICAL IMPRESSION:   Pt. presented with 8/10 back pain, and requesting moist heat pack while sitting, which pt. tolerated well. Pt. required Verbal cues, tactile cues, and cues for visual demonstration of each exercise for form, and technique. Pt. continues to work on improving  BUE strength, and [facility] skills in order to work towards improving engagement with, and maximizing independence with ADLs, and IADL tasks.   PERFORMANCE DEFICITS in  functional skills including ADLs, IADLs, coordination, dexterity, ROM, strength, pain, flexibility, FMC, and GMC, cognitive skills including safety awareness.  IMPAIRMENTS are limiting patient from ADLs, IADLs, and leisure.   COMORBIDITIES has co-morbidities such as scoliosis, chronic back pain and leg pain  that affects occupational performance. Patient will benefit from skilled OT to address above impairments and improve overall function.  MODIFICATION OR ASSISTANCE TO COMPLETE EVALUATION: No modification of tasks or assist necessary to complete an evaluation.  OT OCCUPATIONAL PROFILE AND HISTORY: Problem focused assessment: Including review of records relating to presenting problem.  CLINICAL DECISION MAKING: Moderate - several treatment options, min-mod task modification necessary  REHAB POTENTIAL: Good  EVALUATION COMPLEXITY: Moderate    PLAN: OT FREQUENCY: 2x/week  OT DURATION: 12 weeks  PLANNED INTERVENTIONS: self care/ADL training, therapeutic exercise, therapeutic activity, neuromuscular re-education, manual therapy, passive range of motion, balance training, functional mobility training, moist heat, cryotherapy, patient/family education, cognitive remediation/compensation, energy conservation, coping strategies training, and DME and/or AE instructions  RECOMMENDED OTHER SERVICES: N/A  CONSULTED AND AGREED WITH PLAN OF CARE: Patient and family member/caregiver  PLAN FOR NEXT SESSION: See above  Harrel Carina, MS, OTR/L   Harrel Carina, OT 09/10/2021, 5:14 PM

## 2021-09-11 ENCOUNTER — Encounter: Payer: Self-pay | Admitting: Pain Medicine

## 2021-09-11 ENCOUNTER — Ambulatory Visit (HOSPITAL_BASED_OUTPATIENT_CLINIC_OR_DEPARTMENT_OTHER): Payer: Medicare HMO | Admitting: Pain Medicine

## 2021-09-11 VITALS — BP 95/62 | HR 57 | Temp 97.3°F | Ht 67.0 in | Wt 126.0 lb

## 2021-09-11 DIAGNOSIS — G8929 Other chronic pain: Secondary | ICD-10-CM

## 2021-09-11 DIAGNOSIS — M5417 Radiculopathy, lumbosacral region: Secondary | ICD-10-CM

## 2021-09-11 DIAGNOSIS — M545 Low back pain, unspecified: Secondary | ICD-10-CM

## 2021-09-11 DIAGNOSIS — M47817 Spondylosis without myelopathy or radiculopathy, lumbosacral region: Secondary | ICD-10-CM | POA: Diagnosis not present

## 2021-09-11 DIAGNOSIS — R1312 Dysphagia, oropharyngeal phase: Secondary | ICD-10-CM | POA: Diagnosis not present

## 2021-09-11 DIAGNOSIS — M4310 Spondylolisthesis, site unspecified: Secondary | ICD-10-CM | POA: Diagnosis not present

## 2021-09-11 DIAGNOSIS — M5137 Other intervertebral disc degeneration, lumbosacral region: Secondary | ICD-10-CM | POA: Diagnosis not present

## 2021-09-11 DIAGNOSIS — G20 Parkinson's disease: Secondary | ICD-10-CM

## 2021-09-11 DIAGNOSIS — R937 Abnormal findings on diagnostic imaging of other parts of musculoskeletal system: Secondary | ICD-10-CM | POA: Diagnosis not present

## 2021-09-11 DIAGNOSIS — M79604 Pain in right leg: Secondary | ICD-10-CM

## 2021-09-11 DIAGNOSIS — M79605 Pain in left leg: Secondary | ICD-10-CM | POA: Diagnosis not present

## 2021-09-11 NOTE — Patient Instructions (Signed)
______________________________________________________________________  Preparing for Procedure with Sedation  NOTICE: Due to recent regulatory changes, starting on September 30, 2020, procedures requiring intravenous (IV) sedation will no longer be performed at the [facility].  These types of procedures are required to be performed at [facility] ambulatory surgery facility.  We are very sorry for the inconvenience.  Procedure appointments are limited to planned procedures: No Prescription Refills. No disability issues will be discussed. No medication changes will be discussed.  Instructions: Oral Intake: Do not eat or drink anything for at least 8 hours prior to your procedure. (Exception: Blood Pressure Medication. See below.) Transportation: A driver is required. You may not drive yourself after the procedure. Blood Pressure Medicine: Do not forget to take your blood pressure medicine with a sip of water the morning of the procedure. If your Diastolic (lower reading) is above 100 mmHg, elective cases will be cancelled/rescheduled. Blood thinners: These will need to be stopped for procedures. Notify our staff if you are taking any blood thinners. Depending on which one you take, there will be specific instructions on how and when to stop it. Diabetics on insulin: Notify the staff so that you can be scheduled 1st case in the morning. If your diabetes requires high dose insulin, take only  of your normal insulin dose the morning of the procedure and notify the staff that you have done so. Preventing infections: Shower with an antibacterial soap the morning of your procedure. Build-up your immune system: Take 1000 mg of Vitamin C with every meal (3 times a day) the day prior to your procedure. Antibiotics: Inform the staff if you have a condition or reason that requires you to take antibiotics before dental procedures. Pregnancy: If you are pregnant, call and cancel the procedure. Sickness: If  you have a cold, fever, or any active infections, call and cancel the procedure. Arrival: You must be in the facility at least 30 minutes prior to your scheduled procedure. Children: Do not bring children with you. Dress appropriately: There is always the possibility that your clothing may get soiled. Valuables: Do not bring any jewelry or valuables.  Reasons to call and reschedule or cancel your procedure: (Following these recommendations will minimize the risk of a serious complication.) Surgeries: Avoid having procedures within 2 weeks of any surgery. (Avoid for 2 weeks before or after any surgery). Flu Shots: Avoid having procedures within 2 weeks of a flu shots. (Avoid for 2 weeks before or after immunizations). Barium: Avoid having a procedure within 7-10 days after having had a radiological study involving the use of radiological contrast. (Myelograms, Barium swallow or enema study). Heart attacks: Avoid any elective procedures or surgeries for the initial 6 months after a "Myocardial Infarction" (Heart Attack). Blood thinners: It is imperative that you stop these medications before procedures. Let us know if you if you take any blood thinner.  Infection: Avoid procedures during or within two weeks of an infection (including chest colds or gastrointestinal problems). Symptoms associated with infections include: Localized redness, fever, chills, night sweats or profuse sweating, burning sensation when voiding, cough, congestion, stuffiness, runny nose, sore throat, diarrhea, nausea, vomiting, cold or Flu symptoms, recent or current infections. It is specially important if the infection is over the area that we intend to treat. Heart and lung problems: Symptoms that may suggest an active cardiopulmonary problem include: cough, chest pain, breathing difficulties or shortness of breath, dizziness, ankle swelling, uncontrolled high or unusually low blood pressure, and/or palpitations. If you are    experiencing any of these symptoms, cancel your procedure and contact your primary care physician for an evaluation.  Remember:  Regular Business hours are:  Monday to Thursday 8:00 AM to 4:00 PM  Provider's Schedule: Mack Thurmon, MD:  Procedure days: Tuesday and Thursday 7:30 AM to 4:00 PM  Bilal Lateef, MD:  Procedure days: Monday and Wednesday 7:30 AM to 4:00 PM ______________________________________________________________________  ____________________________________________________________________________________________  General Risks and Possible Complications  Patient Responsibilities: It is important that you read this as it is part of your informed consent. It is our duty to inform you of the risks and possible complications associated with treatments offered to you. It is your responsibility as a patient to read this and to ask questions about anything that is not clear or that you believe was not covered in this document.  Patient's Rights: You have the right to refuse treatment. You also have the right to change your mind, even after initially having agreed to have the treatment done. However, under this last option, if you wait until the last second to change your mind, you may be charged for the materials used up to that point.  Introduction: Medicine is not an exact science. Everything in Medicine, including the lack of treatment(s), carries the potential for danger, harm, or loss (which is by definition: Risk). In Medicine, a complication is a secondary problem, condition, or disease that can aggravate an already existing one. All treatments carry the risk of possible complications. The fact that a side effects or complications occurs, does not imply that the treatment was conducted incorrectly. It must be clearly understood that these can happen even when everything is done following the highest safety standards.  No treatment: You can choose not to proceed with the  proposed treatment alternative. The "PRO(s)" would include: avoiding the risk of complications associated with the therapy. The "CON(s)" would include: not getting any of the treatment benefits. These benefits fall under one of three categories: diagnostic; therapeutic; and/or palliative. Diagnostic benefits include: getting information which can ultimately lead to improvement of the disease or symptom(s). Therapeutic benefits are those associated with the successful treatment of the disease. Finally, palliative benefits are those related to the decrease of the primary symptoms, without necessarily curing the condition (example: decreasing the pain from a flare-up of a chronic condition, such as incurable terminal cancer).  General Risks and Complications: These are associated to most interventional treatments. They can occur alone, or in combination. They fall under one of the following six (6) categories: no benefit or worsening of symptoms; bleeding; infection; nerve damage; allergic reactions; and/or death. No benefits or worsening of symptoms: In Medicine there are no guarantees, only probabilities. No healthcare provider can ever guarantee that a medical treatment will work, they can only state the probability that it may. Furthermore, there is always the possibility that the condition may worsen, either directly, or indirectly, as a consequence of the treatment. Bleeding: This is more common if the patient is taking a blood thinner, either prescription or over the counter (example: Goody Powders, Fish oil, Aspirin, Garlic, etc.), or if suffering a condition associated with impaired coagulation (example: Hemophilia, cirrhosis of the liver, low platelet counts, etc.). However, even if you do not have one on these, it can still happen. If you have any of these conditions, or take one of these drugs, make sure to notify your treating physician. Infection: This is more common in patients with a compromised  immune system, either due to disease (example:   diabetes, cancer, human immunodeficiency virus [HIV], etc.), or due to medications or treatments (example: therapies used to treat cancer and rheumatological diseases). However, even if you do not have one on these, it can still happen. If you have any of these conditions, or take one of these drugs, make sure to notify your treating physician. Nerve Damage: This is more common when the treatment is an invasive one, but it can also happen with the use of medications, such as those used in the treatment of cancer. The damage can occur to small secondary nerves, or to large primary ones, such as those in the spinal cord and brain. This damage may be temporary or permanent and it may lead to impairments that can range from temporary numbness to permanent paralysis and/or brain death. Allergic Reactions: Any time a substance or material comes in contact with our body, there is the possibility of an allergic reaction. These can range from a mild skin rash (contact dermatitis) to a severe systemic reaction (anaphylactic reaction), which can result in death. Death: In general, any medical intervention can result in death, most of the time due to an unforeseen complication. ____________________________________________________________________________________________  

## 2021-09-11 NOTE — Therapy (Signed)
OUTPATIENT SPEECH LANGUAGE PATHOLOGY PARKIN[facility]'S Treatment   Patient Name: Joseph Arroyo MRN: [id number] DOB:Dec 11, 1940, 81 y.o., male Today's Date: 09/11/2021  PCP: Dr Nobie Putnam REFERRING PROVIDER: Dr Lavena Bullion   End of Session - 09/11/21 1231     Visit Number 6    Number of Visits 25    Date for SLP Re-Evaluation 11/12/21    Authorization Type [employer]/HMO PPO    Progress Note Due on Visit 10    SLP Start Time 53    SLP Stop Time  1315    SLP Time Calculation (min) 45 min    Activity Tolerance Patient tolerated treatment well             Past Medical History:  Diagnosis Date   Allergic rhinitis due to pollen 11/21/2007   Brachial neuritis or radiculitis NOS    Cervicalgia    Concussion    age 62 - s/p accident   Costal chondritis    Depression    Essential and other specified forms of tremor    GERD (gastroesophageal reflux disease)    in past   Lumbago    Occlusion and stenosis of carotid artery without mention of cerebral infarction    Seizures [facility])    age 32 - after concussion   Past Surgical History:  Procedure Laterality Date   CATARACT EXTRACTION Right 07/18/14   CATARACT EXTRACTION W/PHACO Left 08/01/2014   Procedure: CATARACT EXTRACTION PHACO AND INTRAOCULAR LENS PLACEMENT ([place]);  Surgeon: Leandrew Koyanagi, MD;  Location: [place];  Service: Ophthalmology;  Laterality: Left;  IVA TOPICAL   COLONOSCOPY  2008   OTHER SURGICAL HISTORY  2002   ear surgery   STAPEDES SURGERY Right 2002   Patient Active Problem List   Diagnosis Date Noted   Cervicalgia 08/05/2021   Spondylosis without myelopathy or radiculopathy, lumbosacral region 06/24/2021   Lumbosacral facet hypertrophy 06/03/2021   Lumbar central spinal stenosis, w/o neurogenic claudication (L4-5) 06/03/2021   Lumbar lateral recess stenosis (Bilateral: L2-3, L4-5) (Right: L3-4) 06/03/2021   Lumbosacral foraminal stenosis (Bilateral: L2-3, L3-4) (Left:  L5-S1) 06/03/2021   Lumbar nerve root impingement (Right: L3 at L2-3 & L3-4) 06/03/2021   Ligamentum flavum hypertrophy (L3-4, L4-5) 06/03/2021   Abnormal MRI, lumbar spine (04/23/2021) 04/24/2021   Long term prescription benzodiazepine use (alprazolam) (Xanax) 03/24/2021   Grade 1 Retrolisthesis of L2/L3 (5 mm) and L3/L4 (3 mm) 03/24/2021   Levoscoliosis of lumbar spine (L3-4 apex) 03/24/2021   DDD (degenerative disc disease), lumbosacral 03/24/2021   Lumbosacral facet arthropathy (Left: L3-4, L4-5, and L5-S1) 03/24/2021   Tricompartment osteoarthritis of knee (Left) 03/24/2021   Baker cyst (Left) 03/24/2021   Chronic low back pain (1ry area of Pain) (Bilateral) (R>L) w/o sciatica 03/24/2021   Lumbar facet syndrome (Bilateral) 03/24/2021   Chronic lower extremity pain (2ry area of Pain) (Bilateral) (L>R) 03/24/2021   Lumbosacral radiculitis/sensory radiculopathy at L2 (Bilateral) 03/24/2021   Lumbosacral radiculitis/sensory radiculopathy at L3 (Bilateral) 03/24/2021   Chronic pain syndrome 03/23/2021   Pharmacologic therapy 03/23/2021   Disorder of skeletal system 03/23/2021   Problems influencing health status 03/23/2021   PAD (peripheral artery disease) ([place]) 10/01/2020   GAD (generalized anxiety disorder) 01/13/2018   MDD (major depressive disorder) 12/45/8099   Umbilical hernia 83/38/2505   Chronic knee pain (Left) 08/11/2017   Derangement of medial meniscus, posterior horn (Left) 08/11/2017   Vitamin D insufficiency 03/05/2016   BPH without obstruction/lower urinary tract symptoms 07/08/2015   Chronic fatigue 10/23/2014  Parkinson's disease ([place]) 09/20/2014   Major depression, recurrent, full remission ([place]) 07/26/2013   Unsteady gait 07/26/2013   Orthostatic hypotension 07/26/2013   Depression 07/26/2013   Anxiety 06/23/2013   Chronic anxiety 06/23/2013   Tremor 05/18/2013   Bilateral carotid artery stenosis 08/30/2012    ONSET DATE: 05/28/2015; referral date  08/11/2021  REFERRING DIAG: G20 (ICD-10-CM) - Parkinson's disease  THERAPY DIAG:  Dysarthria and anarthria  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 Yes: NPRS scale: 2/10 Pain location: back Pain description: lower; right; left Aggravating factors: sitting Relieving factors: under care of pain doctor; receiving epidurals  PATIENT GOALS To be able to change the oil in his car; drive his motorcycle again  OBJECTIVE:   TODAY'S TREATMENT:  Skilled treatment session focused on pt's speech intelligibility goals. SLP facilitated session by providing written instruction for speech HEP: must be completed at home without distractions, 3 times per day; LOUD "ah" 3 sets of 10, read pt's functional phrases 4 times, read 1 verse of scripture (written on sheet). Instruction also provided for pt to practice before he begins conversational speech to promote greater carryover. Pt was Mod I in completing the above exercises at ~ 80dB. SLP observed great carryover into initial conversation with improved rate, prosody and vocal intensity (~ 75dB) thus resulting in > 85% speech intelligibility during 4 minutes of simple conversation.    The Communication Effectiveness Survey is a patient-reported outcome measure in which the patient rates their own effectiveness in different communication situations. A higher score indicates greater effectiveness. Pt's self-rating was 11/32.   PATIENT EDUCATION: Education details: HEP Person educated: Patient and Spouse Education method: Explanation, Media planner, and Verbal cues Education comprehension: verbalized understanding and needs further  education       GOALS: Goals reviewed with patient? Yes  SHORT TERM GOALS: Target date: 10 therapy sessions  The patient produce "ah", High/Lows, and Functional Phrases at average loudness >/= 80 dB and with loud, good quality voice with moderate cues.             Baseline: maximal cues Goal status: INITIAL  2.  The patient will complete reading drills (words/phrases, sentences) at average >/= 75 dB and with loud, good quality voice with moderate cues.  Baseline: maximal cues Goal status: INITIAL  3.   The patient will participate in 5-8 minutes conversation, maintaining average loudness of 75 dB and loud, good quality voice with min cues.  Baseline: maximal cues Goal status: INITIAL  4.  The patient will complete an instrumental evaluation (MBSS) within 3 weeks in order to evaluate swallowing safety. Baseline: last MBSS 2017 Goal status: INITIAL   LONG TERM GOALS: Target date: 11/12/2021    The patient will participate in 15-20 minutes conversation, maintaining average loudness of 75 dB and loud, good quality voice. Baseline: 60dB Goal status: INITIAL  2.   Patient will report improved communication effectiveness as measured by Communicative Effectiveness Survey.   Baseline:  Goal status: INITIAL  3.  Pt will consume least restrictive diet with no s/s of respiratory decline.  Baseline: new goal Goal status: INITIAL   ASSESSMENT:  CLINICAL IMPRESSION: Pt presents with improved completion of HEP. He also demonstrated improved carryover of all concepts.  OBJECTIVE IMPAIRMENTS  Objective impairments include voice disorder and dysphagia. These impairments are limiting patient from effectively communicating at home and in community and safety when swallowing.Factors affecting potential to achieve goals and functional outcome are co-morbidities, medical prognosis, pain level, and severity of impairments.Patient will benefit from skilled SLP services to address above  impairments and improve overall function.  REHAB POTENTIAL: Fair multiple previous ST sessions; progressive severe chronic back pain  PLAN: SLP FREQUENCY: 2x/week  SLP DURATION: 12 weeks  PLANNED INTERVENTIONS: Aspiration precaution training, Pharyngeal strengthening exercises, Diet toleration management , Trials of upgraded texture/liquids, SLP instruction and feedback, and Compensatory strategies   Leanza Shepperson B. Rutherford Nail, M.S., CCC-SLP, Mining engineer Certified Brain Injury [place]  [place] Office [phone] Ascom [phone] Fax [phone]

## 2021-09-11 NOTE — Progress Notes (Signed)
Safety precautions to be maintained throughout the outpatient stay will include: orient to surroundings, keep bed in low position, maintain call bell within reach at all times, provide assistance with transfer out of bed and ambulation.  

## 2021-09-12 ENCOUNTER — Ambulatory Visit
Admission: RE | Admit: 2021-09-12 | Discharge: 2021-09-12 | Disposition: A | Discharge status: Home / Self Care | Payer: Medicare HMO | Source: Ambulatory Visit | Attending: Neurology | Admitting: Neurology

## 2021-09-12 DIAGNOSIS — M47817 Spondylosis without myelopathy or radiculopathy, lumbosacral region: Secondary | ICD-10-CM | POA: Diagnosis not present

## 2021-09-12 DIAGNOSIS — G20 Parkinson's disease: Secondary | ICD-10-CM

## 2021-09-12 DIAGNOSIS — M4310 Spondylolisthesis, site unspecified: Secondary | ICD-10-CM | POA: Diagnosis not present

## 2021-09-12 DIAGNOSIS — M79604 Pain in right leg: Secondary | ICD-10-CM | POA: Insufficient documentation

## 2021-09-12 DIAGNOSIS — M79605 Pain in left leg: Secondary | ICD-10-CM | POA: Insufficient documentation

## 2021-09-12 DIAGNOSIS — M545 Low back pain, unspecified: Secondary | ICD-10-CM | POA: Diagnosis not present

## 2021-09-12 DIAGNOSIS — M5137 Other intervertebral disc degeneration, lumbosacral region: Secondary | ICD-10-CM | POA: Insufficient documentation

## 2021-09-12 DIAGNOSIS — M5417 Radiculopathy, lumbosacral region: Secondary | ICD-10-CM | POA: Diagnosis not present

## 2021-09-12 DIAGNOSIS — G20A1 Parkinson's disease without dyskinesia, without mention of fluctuations: Secondary | ICD-10-CM

## 2021-09-12 DIAGNOSIS — R937 Abnormal findings on diagnostic imaging of other parts of musculoskeletal system: Secondary | ICD-10-CM | POA: Insufficient documentation

## 2021-09-12 DIAGNOSIS — G8929 Other chronic pain: Secondary | ICD-10-CM | POA: Insufficient documentation

## 2021-09-12 DIAGNOSIS — R1312 Dysphagia, oropharyngeal phase: Secondary | ICD-10-CM | POA: Diagnosis not present

## 2021-09-12 DIAGNOSIS — R1310 Dysphagia, unspecified: Secondary | ICD-10-CM | POA: Diagnosis not present

## 2021-09-12 DIAGNOSIS — R6339 Other feeding difficulties: Secondary | ICD-10-CM | POA: Diagnosis not present

## 2021-09-16 ENCOUNTER — Ambulatory Visit: Payer: Medicare HMO | Admitting: Occupational Therapy

## 2021-09-16 ENCOUNTER — Encounter: Payer: Medicare HMO | Admitting: Speech Pathology

## 2021-09-16 ENCOUNTER — Ambulatory Visit: Payer: Medicare HMO | Admitting: Speech Pathology

## 2021-09-16 DIAGNOSIS — R262 Difficulty in walking, not elsewhere classified: Secondary | ICD-10-CM | POA: Diagnosis not present

## 2021-09-16 DIAGNOSIS — R278 Other lack of coordination: Secondary | ICD-10-CM | POA: Diagnosis not present

## 2021-09-16 DIAGNOSIS — R2689 Other abnormalities of gait and mobility: Secondary | ICD-10-CM | POA: Diagnosis not present

## 2021-09-16 DIAGNOSIS — G20 Parkinson's disease: Secondary | ICD-10-CM | POA: Diagnosis not present

## 2021-09-16 DIAGNOSIS — R1312 Dysphagia, oropharyngeal phase: Secondary | ICD-10-CM | POA: Diagnosis not present

## 2021-09-16 DIAGNOSIS — R471 Dysarthria and anarthria: Secondary | ICD-10-CM

## 2021-09-16 DIAGNOSIS — M6281 Muscle weakness (generalized): Secondary | ICD-10-CM

## 2021-09-16 DIAGNOSIS — R269 Unspecified abnormalities of gait and mobility: Secondary | ICD-10-CM | POA: Diagnosis not present

## 2021-09-16 DIAGNOSIS — R490 Dysphonia: Secondary | ICD-10-CM | POA: Diagnosis not present

## 2021-09-16 DIAGNOSIS — R41841 Cognitive communication deficit: Secondary | ICD-10-CM | POA: Diagnosis not present

## 2021-09-16 DIAGNOSIS — R2681 Unsteadiness on feet: Secondary | ICD-10-CM | POA: Diagnosis not present

## 2021-09-16 NOTE — Therapy (Signed)
OUTPATIENT OCCUPATIONAL THERAPY NEURO TREATMENT  Patient Name: Joseph Arroyo MRN: [id number] DOB:20-Jun-1940, 81 y.o., male Today's Date: 09/16/2021  PCP: Dr. Nobie Putnam REFERRING PROVIDER: Dr. Lavena Bullion   OT End of Session - 09/16/21 1716     Visit Number 4    Number of Visits 24    Date for OT Re-Evaluation 11/11/21    Authorization Time Period Reporting period beginning 08/19/21    OT Start Time 1600    OT Stop Time 1645    OT Time Calculation (min) 45 min    Equipment Utilized During Treatment RW    Activity Tolerance Patient tolerated treatment well    Behavior During Therapy [facility] for tasks assessed/performed             Past Medical History:  Diagnosis Date   Allergic rhinitis due to pollen 11/21/2007   Brachial neuritis or radiculitis NOS    Cervicalgia    Concussion    age 42 - s/p accident   Costal chondritis    Depression    Essential and other specified forms of tremor    GERD (gastroesophageal reflux disease)    in past   Lumbago    Occlusion and stenosis of carotid artery without mention of cerebral infarction    Seizures [facility])    age 8 - after concussion   Past Surgical History:  Procedure Laterality Date   CATARACT EXTRACTION Right 07/18/14   CATARACT EXTRACTION W/PHACO Left 08/01/2014   Procedure: CATARACT EXTRACTION PHACO AND INTRAOCULAR LENS PLACEMENT ([place]);  Surgeon: Leandrew Koyanagi, MD;  Location: [place];  Service: Ophthalmology;  Laterality: Left;  IVA TOPICAL   COLONOSCOPY  2008   OTHER SURGICAL HISTORY  2002   ear surgery   STAPEDES SURGERY Right 2002   Patient Active Problem List   Diagnosis Date Noted   Cervicalgia 08/05/2021   Spondylosis without myelopathy or radiculopathy, lumbosacral region 06/24/2021   Lumbosacral facet hypertrophy 06/03/2021   Lumbar central spinal stenosis, w/o neurogenic claudication (L4-5) 06/03/2021   Lumbar lateral recess stenosis (Bilateral: L2-3, L4-5) (Right: L3-4)  06/03/2021   Lumbosacral foraminal stenosis (Bilateral: L2-3, L3-4) (Left: L5-S1) 06/03/2021   Lumbar nerve root impingement (Right: L3 at L2-3 & L3-4) 06/03/2021   Ligamentum flavum hypertrophy (L3-4, L4-5) 06/03/2021   Abnormal MRI, lumbar spine (04/23/2021) 04/24/2021   Long term prescription benzodiazepine use (alprazolam) (Xanax) 03/24/2021   Grade 1 Retrolisthesis of L2/L3 (5 mm) and L3/L4 (3 mm) 03/24/2021   Levoscoliosis of lumbar spine (L3-4 apex) 03/24/2021   DDD (degenerative disc disease), lumbosacral 03/24/2021   Lumbosacral facet arthropathy (Left: L3-4, L4-5, and L5-S1) 03/24/2021   Tricompartment osteoarthritis of knee (Left) 03/24/2021   Baker cyst (Left) 03/24/2021   Chronic low back pain (1ry area of Pain) (Bilateral) (R>L) w/o sciatica 03/24/2021   Lumbar facet syndrome (Bilateral) 03/24/2021   Chronic lower extremity pain (2ry area of Pain) (Bilateral) (L>R) 03/24/2021   Lumbosacral radiculitis/sensory radiculopathy at L2 (Bilateral) 03/24/2021   Lumbosacral radiculitis/sensory radiculopathy at L3 (Bilateral) 03/24/2021   Chronic pain syndrome 03/23/2021   Pharmacologic therapy 03/23/2021   Disorder of skeletal system 03/23/2021   Problems influencing health status 03/23/2021   PAD (peripheral artery disease) ([place]) 10/01/2020   GAD (generalized anxiety disorder) 01/13/2018   MDD (major depressive disorder) 69/48/5462   Umbilical hernia 70/35/0093   Chronic knee pain (Left) 08/11/2017   Derangement of medial meniscus, posterior horn (Left) 08/11/2017   Vitamin D insufficiency 03/05/2016   BPH without obstruction/lower urinary tract  symptoms 07/08/2015   Chronic fatigue 10/23/2014   Parkinson's disease ([place]) 09/20/2014   Major depression, recurrent, full remission ([place]) 07/26/2013   Unsteady gait 07/26/2013   Orthostatic hypotension 07/26/2013   Depression 07/26/2013   Anxiety 06/23/2013   Chronic anxiety 06/23/2013   Tremor 05/18/2013   Bilateral carotid  artery stenosis 08/30/2012    ONSET DATE: 08/11/21 date of referral.  PD symptoms since 2015.  REFERRING DIAG: Parkinson's Disease  THERAPY DIAG:  Muscle weakness (generalized)  Rationale for Evaluation and Treatment Rehabilitation  SUBJECTIVE:   SUBJECTIVE STATEMENT: Pt. Reports having back pain today.  PERTINENT HISTORY: Per chart, pt with hx of Parkinson's disease ([place]); Chronic knee pain (Left); Derangement of medial meniscus, posterior horn (Left); PAD (peripheral artery disease) ([place]); Chronic pain syndrome; DDD (degenerative disc disease) lumbosacral; osteoarthritis of knee (Left); Chronic low back pain, Lumbar facet syndrome (Bilateral); Chronic lower extremity pain. PRECAUTIONS: Fall  WEIGHT BEARING RESTRICTIONS No  PAIN:  Are you having pain? Yes: NPRS scale: 6/10 Pain location: low back Pain description: achy Aggravating factors: N/A, constant Relieving factors: rest, repositioning  FALLS: Has patient fallen in last 6 months? Yes. Number of falls 1-2  LIVING ENVIRONMENT: Lives with: spouse  Lives in: 1 level home  Stairs: 4 steps, a landing, then 3 more steps with bilat rails.  Spouse reports church will likely be building them a ramp Has following equipment at home: Gilford Rile - 2 wheeled, Shower bench, bed side commode, and Grab bars  PLOF: Needs assistance with ADLs.  Pt performs basic self care with set up assist, extra time to manage clothing fasteners.  Pt can take his own shower and dress self, though efficiency is lacking; extra time needed.  Pt/spouse go to a local pool several times per week, and doffing a wet suit then dressing after can take up to 30 min.    PATIENT GOALS : Increase efficiency with daily tasks, increase FMC skills   OBJECTIVE:  HAND DOMINANCE: Right  ADLs: Overall ADLs: performs basic self care with set up, extra time, occasional assist with small clothing fasteners.   Transfers/ambulation related to ADLs: supv-modified indep using  RW. Eating: difficulty cutting food Grooming: modified indep, including shaving with electric razor and straight razor. UB Dressing: extra time to manage clothing fasteners, occasional assist with smaller buttons LB Dressing: extra time to don shoes Toileting: modified indep; 3in1 over toilet Bathing: modified indep, extra time.   Tub Shower transfers: transfer tub bench/modified indep. Equipment: Transfer tub bench, Grab bars, and bed side commode, RW   IADLs: Shopping: Spouse manages at baseline Light housekeeping: spouse manages at baseline Meal Prep: Spouse manages at [employer] mobility: RW, transport chair Medication management: modified Chief Executive Officer: pt requires extra time for writing checks, but pt manages finances independently. Handwriting: 100% legible and Increased time at time of eval, though pt admits this can fluctuate when fatigued.  MOBILITY STATUS: Hx of falls  POSTURE COMMENTS:  rounded shoulders, increased thoracic kyphosis, and hx of scoliosis , L shoulder rests significantly lower than R Sitting balance: Supports self with one extremity  ACTIVITY TOLERANCE: Activity tolerance: Pt reports dressing/bathing tasks cause fatigue, requiring extra time to complete.  Occasional need for transport chair for community mobility.   FUNCTIONAL OUTCOME MEASURES: FOTO: 60  UPPER EXTREMITY ROM     Active ROM Right eval Left eval  Shoulder flexion 135 150  Shoulder abduction 180 180  Shoulder adduction    Shoulder extension    Shoulder internal rotation  Shoulder external rotation WNL WNL  Elbow flexion    Elbow extension    Wrist flexion    Wrist extension    Wrist ulnar deviation    Wrist radial deviation    Wrist pronation    Wrist supination    (Blank rows = not tested)   UPPER EXTREMITY MMT:     MMT Right eval Left eval  Shoulder flexion 5/5 5/5  Shoulder abduction 5/5 5/5  Shoulder adduction    Shoulder extension     Shoulder internal rotation    Shoulder external rotation    Middle trapezius    Lower trapezius    Elbow flexion 5/5 5/5  Elbow extension 5/5 5/5  Wrist flexion 5/5 5/5  Wrist extension 5/5 5/5  Wrist ulnar deviation    Wrist radial deviation    Wrist pronation    Wrist supination    (Blank rows = not tested)  HAND FUNCTION: Grip strength: Right: 50 lbs; Left: 40 lbs, Lateral pinch: Right: 14 lbs, Left: 8 lbs, and 3 point pinch: Right: 11 lbs, Left: 9 lbs  COORDINATION: 9 Hole Peg test: Right: 34 sec; Left: 35 sec Tremors: Resting, action, Right, and Left; more pronounced on the L; mild at time of eval.   Pt takes Parkinson's meds 3x daily, 7a, 12p, 5 p  SENSATION: Not tested    MUSCLE TONE: RUE: Within functional limits and LUE: Within functional limits  COGNITION: Overall cognitive status: Pt appears to be a good historian, alert and oriented x4.  Seems to lack some awareness into deficits/safety as noted by some reported unrealistic functional goals.   VISION: Subjective report: No changes from baseline.  Baseline vision: Wears glasses for reading only Visual history: macular degeneration, hx of cataract removal both eyes; no report of diplopia   OBSERVATIONS: Pt presents with mild BUE coordination impairment, mild dyskinesia with resting hand tremor more pronounced on the L, mild also with activity.    TODAY'S TREATMENT:    Pt. Worked on UE strengthening using red theraband for bilateral diagonal shoulder flexion, horizontal abduction, elbow flexion, and extension. 3# dumbbell ex. for elbow flexion and extension, 2# for forearm supination/pronation, wrist flexion/extension, and radial deviation. Following a HEP. Pt. requires rest breaks and verbal cues for proper technique.   PATIENT EDUCATION: Education details: Proximal stabilization techniques for improved distal control, compensatory strategies for tremors. Person educated: Patient and Spouse Education  method: Explanation and Verbal cues Education comprehension: verbalized understanding   HOME EXERCISE PROGRAM:  BUE strengthening with red theraband, and dumbbell exercises.    GOALS: Goals reviewed with patient? Yes  SHORT TERM GOALS: Target date: 09/30/21  Pt will be indep to perform HEP for increasing strength and coordination in B hands.  Baseline: HEP not yet initiated Goal status: INITIAL  2.  Pt will be indep to verbalize 3 fall prevention strategies to implement during ADLs.  Baseline: Not yet initiated Goal status: INITIAL    3 LONG TERM GOALS: 11/11/21  Pt will increase FOTO score by 3 or more points to indicate increased perceived functional performance with daily tasks.  Baseline: FOTO 60 Goal status: INITIAL  2.  Pt will be able to efficiently send short text messages to family/friends with modified techniques as needed (ie: use of stylus, voice to text) Baseline: Spouse sends texts for pt Goal status: INITIAL  3.  Pt will fill out a check for bill paying with 90% legibility with adapted pen as needed. Baseline: Pt reports inconsistent legibility Goal  status: INITIAL  4.  Pt will increase efficiency with dressing tasks, donning clothes in 15 min or less. Baseline: Spouse reports it can take pt 20-30 min to dress in the morning Goal status: INITIAL    ASSESSMENT:  CLINICAL IMPRESSION:   Pt. presented with 6/10 back pain, and requesting moist heat pack while sitting, which pt. tolerated well. Pt. required Verbal cues, tactile cues, and cues for visual demonstration of each exercise for form, and technique following a HEP. Pt. Education was provided about weighted writing utensils. Pt. continues to work on improving  BUE strength, and [facility] skills in order to work towards improving engagement with, and maximizing independence with ADLs, and IADL tasks.     PERFORMANCE DEFICITS in functional skills including ADLs, IADLs, coordination, dexterity, ROM, strength,  pain, flexibility, FMC, and GMC, cognitive skills including safety awareness.  IMPAIRMENTS are limiting patient from ADLs, IADLs, and leisure.   COMORBIDITIES has co-morbidities such as scoliosis, chronic back pain and leg pain  that affects occupational performance. Patient will benefit from skilled OT to address above impairments and improve overall function.  MODIFICATION OR ASSISTANCE TO COMPLETE EVALUATION: No modification of tasks or assist necessary to complete an evaluation.  OT OCCUPATIONAL PROFILE AND HISTORY: Problem focused assessment: Including review of records relating to presenting problem.  CLINICAL DECISION MAKING: Moderate - several treatment options, min-mod task modification necessary  REHAB POTENTIAL: Good  EVALUATION COMPLEXITY: Moderate    PLAN: OT FREQUENCY: 2x/week  OT DURATION: 12 weeks  PLANNED INTERVENTIONS: self care/ADL training, therapeutic exercise, therapeutic activity, neuromuscular re-education, manual therapy, passive range of motion, balance training, functional mobility training, moist heat, cryotherapy, patient/family education, cognitive remediation/compensation, energy conservation, coping strategies training, and DME and/or AE instructions  RECOMMENDED OTHER SERVICES: N/A  CONSULTED AND AGREED WITH PLAN OF CARE: Patient and family member/caregiver  PLAN FOR NEXT SESSION: See above  Harrel Carina, MS, OTR/L   Harrel Carina, OT 09/16/2021, 5:19 PM

## 2021-09-17 ENCOUNTER — Other Ambulatory Visit: Payer: Self-pay | Admitting: Psychiatry

## 2021-09-17 DIAGNOSIS — F411 Generalized anxiety disorder: Secondary | ICD-10-CM

## 2021-09-17 DIAGNOSIS — R1313 Dysphagia, pharyngeal phase: Secondary | ICD-10-CM | POA: Insufficient documentation

## 2021-09-17 NOTE — Progress Notes (Signed)
Modified Barium Swallow Progress Note  Patient Details  Name: Joseph Arroyo MRN: [id number] Date of Birth: 10/12/1940  Today's Date: 09/17/2021  Modified Barium Swallow completed.  Full report located under Chart Review in the Imaging Section.  Brief recommendations include the following:  Clinical Impression  Pt presents with moderate sensorineural oropharyngeal dysphagia that increases pt's aspiration risk when consuming PO intake when consuming puree, graham cracker, thin liquids via cup and nectar thick liquids via cup. Pt's oral phase is disorganized with rocking of bolus anterior and posteriorly with occasional oral and base of tongue residue. Pt's pharyngeal phase is c/b delayed swallow resulting in penetration during the swallow, reduced hyoid movement resulting in incomplete epiglottic closure and results pharyngeal peristalsis yielding increased pharyngeal residue as consumption continues. Specifically, penetrates begin to accumulate and result in 1 instance of penetration to the cords. Cued cough was successful in clearing these penetrates. Silent aspiration x 1 was observed when consuming consecutive cup sips. The barium tablet had difficulty clearing the pharynx d/t reduced pharyngeal peristalsis. Liquid wash was helpful in clearing tablet. At this time, recommend continuing regular diet with thin liquids via single cup sips, medicine whole with puree.   Swallow Evaluation Recommendations       SLP Diet Recommendations: Regular solids;Thin liquid   Liquid Administration via: Cup   Medication Administration: Whole meds with puree   Supervision: Patient able to self feed   Compensations: Minimize environmental distractions;Slow rate;Small sips/bites   Postural Changes: Remain semi-upright after after feeds/meals (Comment);Seated upright at 90 degrees   Oral Care Recommendations: Oral care BID       Aileana Hodder B. Rutherford Nail, M.S., CCC-SLP, Product manager Certified Brain Injury [place]  [place] Office [phone] Ascom [phone] Fax [phone]

## 2021-09-17 NOTE — Therapy (Signed)
OUTPATIENT SPEECH LANGUAGE PATHOLOGY [facility] Treatment   Patient Name: Joseph Arroyo MRN: [id number] DOB:11/06/1940, 81 y.o., male Today's Date: 09/17/2021  PCP: Dr Nobie Putnam REFERRING PROVIDER: Dr Lavena Bullion   End of Session - 09/17/21 1607     Visit Number 7    Number of Visits 25    Date for SLP Re-Evaluation 11/12/21    Authorization Type [employer] Medicare/HMO PPO    Progress Note Due on Visit 10    SLP Start Time 1500    SLP Stop Time  1600    SLP Time Calculation (min) 60 min    Activity Tolerance Patient tolerated treatment well             Past Medical History:  Diagnosis Date   Allergic rhinitis due to pollen 11/21/2007   Brachial neuritis or radiculitis NOS    Cervicalgia    Concussion    age 53 - s/p accident   Costal chondritis    Depression    Essential and other specified forms of tremor    GERD (gastroesophageal reflux disease)    in past   Lumbago    Occlusion and stenosis of carotid artery without mention of cerebral infarction    Seizures [facility])    age 47 - after concussion   Past Surgical History:  Procedure Laterality Date   CATARACT EXTRACTION Right 07/18/14   CATARACT EXTRACTION W/PHACO Left 08/01/2014   Procedure: CATARACT EXTRACTION PHACO AND INTRAOCULAR LENS PLACEMENT ([place]);  Surgeon: Leandrew Koyanagi, MD;  Location: [facility];  Service: Ophthalmology;  Laterality: Left;  IVA TOPICAL   COLONOSCOPY  2008   OTHER SURGICAL HISTORY  2002   ear surgery   STAPEDES SURGERY Right 2002   Patient Active Problem List   Diagnosis Date Noted   Dysphagia, pharyngeal phase 09/17/2021   Cervicalgia 08/05/2021   Spondylosis without myelopathy or radiculopathy, lumbosacral region 06/24/2021   Lumbosacral facet hypertrophy 06/03/2021   Lumbar central spinal stenosis, w/o neurogenic claudication (L4-5) 06/03/2021   Lumbar lateral recess stenosis (Bilateral: L2-3, L4-5) (Right: L3-4) 06/03/2021   Lumbosacral foraminal  stenosis (Bilateral: L2-3, L3-4) (Left: L5-S1) 06/03/2021   Lumbar nerve root impingement (Right: L3 at L2-3 & L3-4) 06/03/2021   Ligamentum flavum hypertrophy (L3-4, L4-5) 06/03/2021   Abnormal MRI, lumbar spine (04/23/2021) 04/24/2021   Long term prescription benzodiazepine use (alprazolam) (Xanax) 03/24/2021   Grade 1 Retrolisthesis of L2/L3 (5 mm) and L3/L4 (3 mm) 03/24/2021   Levoscoliosis of lumbar spine (L3-4 apex) 03/24/2021   DDD (degenerative disc disease), lumbosacral 03/24/2021   Lumbosacral facet arthropathy (Left: L3-4, L4-5, and L5-S1) 03/24/2021   Tricompartment osteoarthritis of knee (Left) 03/24/2021   Baker cyst (Left) 03/24/2021   Chronic low back pain (1ry area of Pain) (Bilateral) (R>L) w/o sciatica 03/24/2021   Lumbar facet syndrome (Bilateral) 03/24/2021   Chronic lower extremity pain (2ry area of Pain) (Bilateral) (L>R) 03/24/2021   Lumbosacral radiculitis/sensory radiculopathy at L2 (Bilateral) 03/24/2021   Lumbosacral radiculitis/sensory radiculopathy at L3 (Bilateral) 03/24/2021   Chronic pain syndrome 03/23/2021   Pharmacologic therapy 03/23/2021   Disorder of skeletal system 03/23/2021   Problems influencing health status 03/23/2021   PAD (peripheral artery disease) ([place]) 10/01/2020   GAD (generalized anxiety disorder) 01/13/2018   MDD (major depressive disorder) 55/97/4163   Umbilical hernia 84/53/6468   Chronic knee pain (Left) 08/11/2017   Derangement of medial meniscus, posterior horn (Left) 08/11/2017   Vitamin D insufficiency 03/05/2016   BPH without obstruction/lower urinary tract symptoms 07/08/2015  Chronic fatigue 10/23/2014   Parkinson's disease ([place]) 09/20/2014   Major depression, recurrent, full remission ([place]) 07/26/2013   Unsteady gait 07/26/2013   Orthostatic hypotension 07/26/2013   Depression 07/26/2013   Anxiety 06/23/2013   Chronic anxiety 06/23/2013   Tremor 05/18/2013   Bilateral carotid artery stenosis 08/30/2012    ONSET  DATE: 05/28/2015; referral date 08/11/2021  REFERRING DIAG: G20 (ICD-10-CM) - Parkinson's disease  THERAPY DIAG:  Dysarthria and anarthria  Dysphagia, oropharyngeal phase  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 Yes: NPRS scale: 2/10 Pain location: back Pain description: lower; right; left Aggravating factors: sitting Relieving factors: under care of pain doctor; receiving epidurals  PATIENT GOALS To be able to change the oil in his car; drive his motorcycle again  OBJECTIVE:   TODAY'S TREATMENT:  SLP review images/video of Modified Barium Swallow Study, described impairments and increased risk of aspiration especially when consuming consecutive sips of liquids. Physical demonstration using a banana and a walnut to illustrate viewing food choices in terms of their cohesiveness. For example, the banana was demonstrated to be more cohesive whereas the walnut fell apart into many pieces posing a greater aspiration risk.   The Communication Effectiveness Survey is a patient-reported outcome measure in which the patient rates their own effectiveness in different communication situations. A higher score indicates greater effectiveness. Pt's self-rating was 11/32.   PATIENT EDUCATION: Education details: HEP Person educated: Patient and Spouse Education method: Explanation, Media planner, and Verbal cues Education comprehension: verbalized understanding and needs further education       GOALS: Goals reviewed with patient? Yes  SHORT TERM GOALS: Target date: 10 therapy sessions  The patient produce "ah", High/Lows, and Functional Phrases at average loudness >/= 80 dB and with loud,  good quality voice with moderate cues.             Baseline: maximal cues Goal status: INITIAL  2.  The patient will complete reading drills (words/phrases, sentences) at average >/= 75 dB and with loud, good quality voice with moderate cues.  Baseline: maximal cues Goal status: INITIAL  3.   The patient will participate in 5-8 minutes conversation, maintaining average loudness of 75 dB and loud, good quality voice with min cues.  Baseline: maximal cues Goal status: INITIAL  4.  The patient will complete an instrumental evaluation (MBSS) within 3 weeks in order to evaluate swallowing safety. Baseline: last MBSS 2017 Goal status: Met   LONG TERM GOALS: Target date: 11/12/2021    The patient will participate in 15-20 minutes conversation, maintaining average loudness of 75 dB and loud, good quality voice. Baseline: 60dB Goal status: INITIAL  2.   Patient will report improved communication effectiveness as measured by Communicative Effectiveness Survey.   Baseline:  Goal status: INITIAL  3.  Pt will consume least restrictive diet with no s/s of respiratory decline.  Baseline: new goal Goal status: Met   ASSESSMENT:  CLINICAL IMPRESSION: Pt presents oropharyngeal abilities that are marginally decreased when compared to previous Modified Barium in 2017. As such, don't recommend any diet modifications at this time. Skilled ST intervention is not warranted for dysphagia.   OBJECTIVE IMPAIRMENTS  Objective impairments include voice disorder and dysphagia. These impairments are limiting patient from  effectively communicating at home and in community and safety when swallowing.Factors affecting potential to achieve goals and functional outcome are co-morbidities, medical prognosis, pain level, and severity of impairments.Patient will benefit from skilled SLP services to address above impairments and improve overall function.  REHAB POTENTIAL: Fair multiple previous ST sessions;  progressive severe chronic back pain  PLAN: SLP FREQUENCY: 2x/week  SLP DURATION: 12 weeks  PLANNED INTERVENTIONS: Aspiration precaution training, Pharyngeal strengthening exercises, Diet toleration management , Trials of upgraded texture/liquids, SLP instruction and feedback, and Compensatory strategies   Cranston Koors B. Rutherford Nail, M.S., CCC-SLP, Mining engineer Certified Brain Injury [place]  [place] Office [phone] Ascom [phone] Fax [phone]

## 2021-09-18 ENCOUNTER — Ambulatory Visit: Payer: Medicare HMO

## 2021-09-19 ENCOUNTER — Ambulatory Visit: Payer: Medicare HMO | Admitting: Speech Pathology

## 2021-09-19 ENCOUNTER — Ambulatory Visit: Payer: Medicare HMO

## 2021-09-19 DIAGNOSIS — G20 Parkinson's disease: Secondary | ICD-10-CM | POA: Diagnosis not present

## 2021-09-19 DIAGNOSIS — R471 Dysarthria and anarthria: Secondary | ICD-10-CM | POA: Diagnosis not present

## 2021-09-19 DIAGNOSIS — R41841 Cognitive communication deficit: Secondary | ICD-10-CM | POA: Diagnosis not present

## 2021-09-19 DIAGNOSIS — R2689 Other abnormalities of gait and mobility: Secondary | ICD-10-CM | POA: Diagnosis not present

## 2021-09-19 DIAGNOSIS — R1312 Dysphagia, oropharyngeal phase: Secondary | ICD-10-CM | POA: Diagnosis not present

## 2021-09-19 DIAGNOSIS — R269 Unspecified abnormalities of gait and mobility: Secondary | ICD-10-CM | POA: Diagnosis not present

## 2021-09-19 DIAGNOSIS — R2681 Unsteadiness on feet: Secondary | ICD-10-CM | POA: Diagnosis not present

## 2021-09-19 DIAGNOSIS — R278 Other lack of coordination: Secondary | ICD-10-CM | POA: Diagnosis not present

## 2021-09-19 DIAGNOSIS — R490 Dysphonia: Secondary | ICD-10-CM | POA: Diagnosis not present

## 2021-09-19 DIAGNOSIS — M6281 Muscle weakness (generalized): Secondary | ICD-10-CM | POA: Diagnosis not present

## 2021-09-19 DIAGNOSIS — R262 Difficulty in walking, not elsewhere classified: Secondary | ICD-10-CM | POA: Diagnosis not present

## 2021-09-19 NOTE — Therapy (Signed)
OUTPATIENT SPEECH LANGUAGE PATHOLOGY [facility]S Treatment   Patient Name: Joseph Arroyo MRN: [id number] DOB:December 17, 1940, 81 y.o., male Today's Date: 09/19/2021  PCP: Dr Nobie Putnam REFERRING PROVIDER: Dr Lavena Bullion   End of Session - 09/19/21 0808     Visit Number 8    Number of Visits 25    Date for SLP Re-Evaluation 11/12/21    Authorization Type [employer] Medicare/HMO PPO    Progress Note Due on Visit 10    SLP Start Time 0800    SLP Stop Time  0900    SLP Time Calculation (min) 60 min    Activity Tolerance Patient tolerated treatment well             Past Medical History:  Diagnosis Date   Allergic rhinitis due to pollen 11/21/2007   Brachial neuritis or radiculitis NOS    Cervicalgia    Concussion    age 39 - s/p accident   Costal chondritis    Depression    Essential and other specified forms of tremor    GERD (gastroesophageal reflux disease)    in past   Lumbago    Occlusion and stenosis of carotid artery without mention of cerebral infarction    Seizures [facility])    age 83 - after concussion   Past Surgical History:  Procedure Laterality Date   CATARACT EXTRACTION Right 07/18/14   CATARACT EXTRACTION W/PHACO Left 08/01/2014   Procedure: CATARACT EXTRACTION PHACO AND INTRAOCULAR LENS PLACEMENT ([place]);  Surgeon: Leandrew Koyanagi, MD;  Location: [facility];  Service: Ophthalmology;  Laterality: Left;  IVA TOPICAL   COLONOSCOPY  2008   OTHER SURGICAL HISTORY  2002   ear surgery   STAPEDES SURGERY Right 2002   Patient Active Problem List   Diagnosis Date Noted   Dysphagia, pharyngeal phase 09/17/2021   Cervicalgia 08/05/2021   Spondylosis without myelopathy or radiculopathy, lumbosacral region 06/24/2021   Lumbosacral facet hypertrophy 06/03/2021   Lumbar central spinal stenosis, w/o neurogenic claudication (L4-5) 06/03/2021   Lumbar lateral recess stenosis (Bilateral: L2-3, L4-5) (Right: L3-4) 06/03/2021   Lumbosacral foraminal  stenosis (Bilateral: L2-3, L3-4) (Left: L5-S1) 06/03/2021   Lumbar nerve root impingement (Right: L3 at L2-3 & L3-4) 06/03/2021   Ligamentum flavum hypertrophy (L3-4, L4-5) 06/03/2021   Abnormal MRI, lumbar spine (04/23/2021) 04/24/2021   Long term prescription benzodiazepine use (alprazolam) (Xanax) 03/24/2021   Grade 1 Retrolisthesis of L2/L3 (5 mm) and L3/L4 (3 mm) 03/24/2021   Levoscoliosis of lumbar spine (L3-4 apex) 03/24/2021   DDD (degenerative disc disease), lumbosacral 03/24/2021   Lumbosacral facet arthropathy (Left: L3-4, L4-5, and L5-S1) 03/24/2021   Tricompartment osteoarthritis of knee (Left) 03/24/2021   Baker cyst (Left) 03/24/2021   Chronic low back pain (1ry area of Pain) (Bilateral) (R>L) w/o sciatica 03/24/2021   Lumbar facet syndrome (Bilateral) 03/24/2021   Chronic lower extremity pain (2ry area of Pain) (Bilateral) (L>R) 03/24/2021   Lumbosacral radiculitis/sensory radiculopathy at L2 (Bilateral) 03/24/2021   Lumbosacral radiculitis/sensory radiculopathy at L3 (Bilateral) 03/24/2021   Chronic pain syndrome 03/23/2021   Pharmacologic therapy 03/23/2021   Disorder of skeletal system 03/23/2021   Problems influencing health status 03/23/2021   PAD (peripheral artery disease) ([place]) 10/01/2020   GAD (generalized anxiety disorder) 01/13/2018   MDD (major depressive disorder) 76/72/0947   Umbilical hernia 09/62/8366   Chronic knee pain (Left) 08/11/2017   Derangement of medial meniscus, posterior horn (Left) 08/11/2017   Vitamin D insufficiency 03/05/2016   BPH without obstruction/lower urinary tract symptoms 07/08/2015  Chronic fatigue 10/23/2014   Parkinson's disease ([place]) 09/20/2014   Major depression, recurrent, full remission ([place]) 07/26/2013   Unsteady gait 07/26/2013   Orthostatic hypotension 07/26/2013   Depression 07/26/2013   Anxiety 06/23/2013   Chronic anxiety 06/23/2013   Tremor 05/18/2013   Bilateral carotid artery stenosis 08/30/2012    ONSET  DATE: 05/28/2015; referral date 08/11/2021  REFERRING DIAG: G20 (ICD-10-CM) - Parkinson's disease  THERAPY DIAG:  Dysarthria and anarthria  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 Yes: NPRS scale: 2/10 Pain location: back Pain description: lower; right; left Aggravating factors: sitting Relieving factors: under care of pain doctor; receiving epidurals  PATIENT GOALS To be able to change the oil in his car; drive his motorcycle again  OBJECTIVE:   TODAY'S TREATMENT:  Skilled treatment session focused on pt's dysarthria goals. SLP facilitated by having pt produce loud, good quality "ah," as well as read list of functional phrases. Pt able to achieve 80dB, pitch of 131 Hz, and 5.5 seconds. While pt was able to increase vocal intensity, his vocal quality was strained and very "throaty" sounding which in turn reduced pt's speech intelligibility to ~ 75%. Physical strain observed in pt's neck. Suspect this is a maladaptive vocal behavior to compensate for sub-optimal breath support d/t clavicular breathing (pt unable to perform abdominal breathing d/t skeletomuscular deformities).  As a result, SLP further facilitated session by introducing and instructing in resonance voice. Pt required maximal multi-modal faded to moderate assistance to produce sustained hum, hum-ascending/descending glides, hum-vowel, hum-word. Pt unable to produce hum-hills d/t poor respiratory support.  The Communication Effectiveness Survey is a patient-reported outcome measure in which the patient rates their own effectiveness in different communication situations. A higher score indicates greater effectiveness. Pt's self-rating was 11/32.   PATIENT  EDUCATION: Education details: Archivist Person educated: Patient and Spouse Education method: Explanation, Demonstration, and Verbal cues Education comprehension: verbalized understanding and needs further education       GOALS: Goals reviewed with patient? Yes  SHORT TERM GOALS: Target date: 10 therapy sessions  The patient produce "ah", High/Lows, and Functional Phrases at average loudness >/= 80 dB and with loud, good quality voice with moderate cues.             Baseline: maximal cues Goal status: INITIAL  2.  The patient will complete reading drills (words/phrases, sentences) at average >/= 75 dB and with loud, good quality voice with moderate cues.  Baseline: maximal cues Goal status: INITIAL  3.   The patient will participate in 5-8 minutes conversation, maintaining average loudness of 75 dB and loud, good quality voice with min cues.  Baseline: maximal cues Goal status: INITIAL  4.  The patient will complete an instrumental evaluation (MBSS) within 3 weeks in order to evaluate swallowing safety. Baseline: last MBSS 2017 Goal status: Met   LONG TERM GOALS: Target date: 11/12/2021    The patient will participate in 15-20 minutes conversation, maintaining average loudness of 75 dB and loud, good quality voice. Baseline: 60dB Goal status: INITIAL  2.   Patient will report improved communication effectiveness as measured by Communicative Effectiveness Survey.   Baseline:  Goal status: INITIAL  3.  Pt will consume least restrictive diet with no s/s of respiratory decline.  Baseline: new goal  Goal status: Met   ASSESSMENT:  CLINICAL IMPRESSION: Pt presents with moderate hypokinetic dysarthria c/b low vocal intensity, fast rate and imprecise articulation. In attempts to increase vocal intensity, pt presents with pharyngeal resonance as a mal-adaptive compensatory behavior. Pt demonstrate good initial stimulability for resonance voice therapy at the sacrifice of  vocal intensity at 70 dB.  OBJECTIVE IMPAIRMENTS  Objective impairments include voice disorder and dysphagia. These impairments are limiting patient from effectively communicating at home and in community and safety when swallowing.Factors affecting potential to achieve goals and functional outcome are co-morbidities, medical prognosis, pain level, and severity of impairments.Patient will benefit from skilled SLP services to address above impairments and improve overall function.  REHAB POTENTIAL: Fair multiple previous ST sessions; progressive severe chronic back pain  PLAN: SLP FREQUENCY: 2x/week  SLP DURATION: 12 weeks  PLANNED INTERVENTIONS: Aspiration precaution training, Pharyngeal strengthening exercises, Diet toleration management , Trials of upgraded texture/liquids, SLP instruction and feedback, and Compensatory strategies   Psalm Arman B. Rutherford Nail, M.S., CCC-SLP, Mining engineer Certified Brain Injury [place]  [place] Office [phone] Ascom [phone] Fax [phone]

## 2021-09-19 NOTE — Therapy (Signed)
OUTPATIENT PHYSICAL THERAPY NEURO TREATMENT   Patient Name: Joseph Arroyo MRN: [id number] DOB:[birth year], 81 y.o., male Today's Date: 09/19/2021   PCP: Olin Hauser, DO REFERRING PROVIDER: Lavena Bullion, NP   PT End of Session - 09/19/21 1132     Visit Number 2    Number of Visits 25    Date for PT Re-Evaluation 11/13/21    Authorization Type [employer]    Authorization Time Period Initial Cert 08/16/735- 03/07/2692    Progress Note Due on Visit 10    PT Start Time 0900    PT Stop Time 0930    PT Time Calculation (min) 30 min    Equipment Utilized During Treatment Gait belt    Activity Tolerance Patient tolerated treatment well    Behavior During Therapy [facility] for tasks assessed/performed             Past Medical History:  Diagnosis Date   Allergic rhinitis due to pollen 11/21/2007   Brachial neuritis or radiculitis NOS    Cervicalgia    Concussion    age 61 - s/p accident   Costal chondritis    Depression    Essential and other specified forms of tremor    GERD (gastroesophageal reflux disease)    in past   Lumbago    Occlusion and stenosis of carotid artery without mention of cerebral infarction    Seizures [facility])    age 21 - after concussion   Past Surgical History:  Procedure Laterality Date   CATARACT EXTRACTION Right 07/18/14   CATARACT EXTRACTION W/PHACO Left 08/01/2014   Procedure: CATARACT EXTRACTION PHACO AND INTRAOCULAR LENS PLACEMENT ([place]);  Surgeon: Leandrew Koyanagi, MD;  Location: [place];  Service: Ophthalmology;  Laterality: Left;  IVA TOPICAL   COLONOSCOPY  2008   OTHER SURGICAL HISTORY  2002   ear surgery   STAPEDES SURGERY Right 2002   Patient Active Problem List   Diagnosis Date Noted   Dysphagia, pharyngeal phase 09/17/2021   Cervicalgia 08/05/2021   Spondylosis without myelopathy or radiculopathy, lumbosacral region 06/24/2021   Lumbosacral facet hypertrophy 06/03/2021   Lumbar central spinal  stenosis, w/o neurogenic claudication (L4-5) 06/03/2021   Lumbar lateral recess stenosis (Bilateral: L2-3, L4-5) (Right: L3-4) 06/03/2021   Lumbosacral foraminal stenosis (Bilateral: L2-3, L3-4) (Left: L5-S1) 06/03/2021   Lumbar nerve root impingement (Right: L3 at L2-3 & L3-4) 06/03/2021   Ligamentum flavum hypertrophy (L3-4, L4-5) 06/03/2021   Abnormal MRI, lumbar spine (04/23/2021) 04/24/2021   Long term prescription benzodiazepine use (alprazolam) (Xanax) 03/24/2021   Grade 1 Retrolisthesis of L2/L3 (5 mm) and L3/L4 (3 mm) 03/24/2021   Levoscoliosis of lumbar spine (L3-4 apex) 03/24/2021   DDD (degenerative disc disease), lumbosacral 03/24/2021   Lumbosacral facet arthropathy (Left: L3-4, L4-5, and L5-S1) 03/24/2021   Tricompartment osteoarthritis of knee (Left) 03/24/2021   Baker cyst (Left) 03/24/2021   Chronic low back pain (1ry area of Pain) (Bilateral) (R>L) w/o sciatica 03/24/2021   Lumbar facet syndrome (Bilateral) 03/24/2021   Chronic lower extremity pain (2ry area of Pain) (Bilateral) (L>R) 03/24/2021   Lumbosacral radiculitis/sensory radiculopathy at L2 (Bilateral) 03/24/2021   Lumbosacral radiculitis/sensory radiculopathy at L3 (Bilateral) 03/24/2021   Chronic pain syndrome 03/23/2021   Pharmacologic therapy 03/23/2021   Disorder of skeletal system 03/23/2021   Problems influencing health status 03/23/2021   PAD (peripheral artery disease) ([place]) 10/01/2020   GAD (generalized anxiety disorder) 01/13/2018   MDD (major depressive disorder) 85/46/2703   Umbilical hernia 50/10/3816   Chronic knee  pain (Left) 08/11/2017   Derangement of medial meniscus, posterior horn (Left) 08/11/2017   Vitamin D insufficiency 03/05/2016   BPH without obstruction/lower urinary tract symptoms 07/08/2015   Chronic fatigue 10/23/2014   Parkinson's disease ([place]) 09/20/2014   Major depression, recurrent, full remission ([place]) 07/26/2013   Unsteady gait 07/26/2013   Orthostatic hypotension  07/26/2013   Depression 07/26/2013   Anxiety 06/23/2013   Chronic anxiety 06/23/2013   Tremor 05/18/2013   Bilateral carotid artery stenosis 08/30/2012    ONSET DATE: 08/11/2021  REFERRING DIAG: Parkinsons Disease  THERAPY DIAG:  Dysarthria and anarthria  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 He denies any MD visits, meds updates, falls since eval. Pt has been going to the gym 2-3x/week as tolerated.  Pt accompanied by: significant other  PERTINENT HISTORY: Per chart history provided by Dr. Consuela Mimes- Mr. Everitt has Parkinson's disease ([place]); Chronic knee pain (Left); Derangement of medial meniscus, posterior horn (Left); PAD (peripheral artery disease) ([place]); Chronic pain syndrome; Grade 1 Retrolisthesis of L2/L3 (5 mm) and L3/L4 (3 mm); Levoscoliosis of lumbar spine (L3-4 apex); DDD (degenerative disc disease), lumbosacral; Lumbosacral facet arthropathy (Left: L3-4, L4-5, and L5-S1); Tricompartment osteoarthritis of knee (Left); Baker cyst (Left); Chronic low back pain (1ry area of Pain) (Bilateral) (R>L) w/o sciatica; Lumbar facet syndrome (Bilateral); Chronic lower extremity pain (2ry area of Pain) (Bilateral) (L>R); Lumbosacral radiculitis/sensory radiculopathy at L2 (Bilateral); Lumbosacral radiculitis/sensory radiculopathy at L3 (Bilateral); Abnormal MRI, lumbar spine (04/23/2021); Lumbosacral facet hypertrophy; Lumbar central spinal stenosis, w/o neurogenic claudication (L4-5); Lumbar lateral recess stenosis (Bilateral: L2-3, L4-5) (Right: L3-4); Lumbosacral foraminal stenosis (Bilateral: L2-3, L3-4) (Left:  L5-S1); Lumbar nerve root impingement (Right: L3 at L2-3 & L3-4); Ligamentum flavum hypertrophy (L3-4, L4-5); Spondylosis without myelopathy or radiculopathy, lumbosacral region; and Cervicalgia on their pertinent problem list   Patient has appointment for Epidural to low back next Tues 08/26/2021  PAIN:  Are you having pain? Yes: NPRS scale: 5/10 Pain location: low back Pain description: ache/soreness Aggravating factors: prolonged standing, transfers, walking Relieving factors: rest and medications  PRECAUTIONS: Fall  WEIGHT BEARING RESTRICTIONS No  FALLS: Has patient fallen in last 6 months? Yes. Number of falls 2  LIVING ENVIRONMENT: Lives with: lives with their spouse Lives in: House/apartment  Stairs: 4 Has following equipment at home: Walker - 2 wheeled  PLOF: Needs assistance with ADLs- intermittent  PATIENT GOALS  to walk better and be as active as possible. Also to get in/out of cars better.   OBJECTIVE:   DIAGNOSTIC FINDINGS: IMPRESSION: 1. Severe lumbar levoscoliosis with widespread advanced disc and facet degeneration. 2. Mild spinal stenosis at L4-5. 3. Moderate neural foraminal stenosis on the right at L3-4 and on the left at L5-S1. 4. Moderate right lateral recess stenosis at L2-3.     Electronically Signed   By: Logan Bores M.D.   On: 04/23/2021 16:41  COGNITION: Overall cognitive status: Within functional limits for tasks assessed   SENSATION: WFL  COORDINATION: Difficulty with coordination - slower response at times  EDEMA:  None  POSTURE: rounded shoulders, forward head, increased lumbar lordosis, and posterior pelvic tilt   LOWER EXTREMITY MMT:    MMT (evaluation)  Right Eval Left Eval  Hip flexion 4 4  Hip extension 4 4  Hip abduction 4 4  Hip adduction 4 4  Hip internal rotation 4 4  Hip external rotation 4 4  Knee flexion 4 4  Knee extension 4 4  Ankle dorsiflexion 4 4  Ankle plantarflexion 4 4  Ankle inversion 4 4  Ankle  eversion 4 4  (Blank rows = not tested)  BED MOBILITY:  Not tested  TRANSFERS: Assistive device utilized: Environmental consultant - 2 wheeled  Sit to stand: Modified independence Stand to sit: SBA Chair to chair: SBA     FUNCTIONAL TESTS (EVALUATION):  5 times sit to stand: 14 sec without UE support Timed up and go (TUG): 15.67 sec with RW 10 meter walk test: 10.67 sec = 0.94 m/s using RW Berg Balance Scale: 48/56- see flowsheet for details  PATIENT SURVEYS:  FOTO : 49  TODAY'S TREATMENT 09/19/21  BP assessment:  Seated BP: 77/23mHg    61bpm  Standing  x0 minutes 68/559mg      70bpm Standing x1 minute 79/5629m  (MAP 65)      69bpm  (no symptoms, no presyncope)     -[id number] RW, overground- 1.65m/62mo LOB, appears strong and pleasant  -normal stance foam 30sec quiet stance, 30sec eyes closed, vertical head turns on foam x20, horitzonal head turns x10 bilat -normal stance airex ball tosses  -normal stance airex yello 4000g chest press x20 (2 big LOB, subsequent correction)      PATIENT EDUCATION: Education details: need to monitor BP closely  Person educated: Patient Education method: Explanation, Demonstration, Tactile cues, and Verbal cues Education comprehension: verbalized understanding, returned demonstration, verbal cues required, tactile cues required, and needs further education   HOME EXERCISE PROGRAM: To be initiated in future visit after extensive trial of multiple balance interventions and close monitoring of hypotension.     GOALS: Goals reviewed with patient? Yes  SHORT TERM GOALS: Target date: 10/31/2021  Pt will be independent with initial HEP in order to improve strength and balance in order to decrease fall risk and improve function at home and work.  Baseline: 08/21/2021-No formal HEP in place Goal status: INITIAL    LONG TERM GOALS: Target date: 12/12/2021  Pt will improve BERG by at least 3 points in order to demonstrate clinically significant  improvement in balance.    Baseline: 08/21/2021= 48/56 Goal status: INITIAL  2.  Pt will be independent with final HEP in order to improve strength and balance in order to decrease fall risk and improve function at home  and work.  Baseline: No HEP in place Goal status: INITIAL  3.  Pt will decrease 5TSTS by at least 4 seconds in order to demonstrate clinically significant improvement in LE strength. Baseline: 08/21/2021= 19.01 sec without UE support Goal status: INITIAL  4.  Pt will decrease TUG to below 14 seconds/decrease in order to demonstrate decreased fall risk. Baseline:  08/21/2021=15.67 with RW Goal status: INITIAL  5.  Pt will improve FOTO to target score of 56 to display perceived improvements in ability to complete ADL's.  Baseline: 08/21/2021=49 Goal status: INITIAL  6.  Pt will increase 10MWT by at least 0.15 m/s in order to demonstrate clinically significant improvement in community ambulation.   Baseline: 08/22/2021=0.94 m/s Goal status: INITIAL  ASSESSMENT:  CLINICAL IMPRESSION:  Session started 15 minutes late due to scheduling overlap. Pt/family agreeable to commence PT today since being unable to schedule for past 4 weeks. Began to explore appropriate interventions for imbalance in sagittal plane. Pt much more mobile than anticipated, especially from a gait standpoint given his extremely low pressures and orthostasis. Pt has no symptoms or presyncope, however MAP is below empirical levels thought to be required for organ perfusion. Pt tolerates session generally well regarding chronic low back pain, but started with hot pack on back during his SLP session. Family in attendance for session, all questions addressed. Family is eager to have a balance HEP issued, however this is deferred to later sessions. Author recommended continuance of the patient's current physical activity regimen.    OBJECTIVE IMPAIRMENTS Abnormal gait, decreased activity tolerance, decreased balance,  decreased coordination, decreased endurance, decreased knowledge of use of DME, decreased mobility, difficulty walking, decreased ROM, decreased strength, hypomobility, and impaired flexibility.   ACTIVITY LIMITATIONS carrying, lifting, bending, standing, squatting, sleeping, stairs, transfers, continence, bathing, toileting, and dressing  PARTICIPATION LIMITATIONS: cleaning, laundry, medication management, driving, shopping, community activity, and yard work  PERSONAL FACTORS Age are also affecting patient's functional outcome.   REHAB POTENTIAL: Good  CLINICAL DECISION MAKING: Stable/uncomplicated  EVALUATION COMPLEXITY: Low  PLAN: PT FREQUENCY: 2x/week  PT DURATION: 12 weeks  PLANNED INTERVENTIONS: Therapeutic exercises, Therapeutic activity, Neuromuscular re-education, Balance training, Gait training, Patient/Family education, Joint mobilization, Stair training, Vestibular training, Canalith repositioning, DME instructions, Dry Needling, Electrical stimulation, Spinal manipulation, and Spinal mobilization  PLAN FOR NEXT SESSION: continue balance training, monitor hypotension and orthostatics, ROM for flexibility,   Elorah Dewing C, PT 09/19/2021, 11:40 AM  11:42 AM, 09/19/21 Etta Grandchild, PT, DPT Physical Therapist - [place] [phone]

## 2021-09-23 ENCOUNTER — Encounter: Payer: Medicare HMO | Admitting: Speech Pathology

## 2021-09-23 ENCOUNTER — Encounter: Payer: Self-pay | Admitting: Physical Therapy

## 2021-09-23 ENCOUNTER — Ambulatory Visit: Payer: Medicare HMO | Admitting: Speech Pathology

## 2021-09-23 ENCOUNTER — Ambulatory Visit: Payer: Medicare HMO | Admitting: Physical Therapy

## 2021-09-23 DIAGNOSIS — M6281 Muscle weakness (generalized): Secondary | ICD-10-CM | POA: Diagnosis not present

## 2021-09-23 DIAGNOSIS — R2681 Unsteadiness on feet: Secondary | ICD-10-CM | POA: Diagnosis not present

## 2021-09-23 DIAGNOSIS — R2689 Other abnormalities of gait and mobility: Secondary | ICD-10-CM

## 2021-09-23 DIAGNOSIS — R1312 Dysphagia, oropharyngeal phase: Secondary | ICD-10-CM | POA: Diagnosis not present

## 2021-09-23 DIAGNOSIS — R269 Unspecified abnormalities of gait and mobility: Secondary | ICD-10-CM

## 2021-09-23 DIAGNOSIS — R262 Difficulty in walking, not elsewhere classified: Secondary | ICD-10-CM

## 2021-09-23 DIAGNOSIS — R41841 Cognitive communication deficit: Secondary | ICD-10-CM

## 2021-09-23 DIAGNOSIS — R278 Other lack of coordination: Secondary | ICD-10-CM | POA: Diagnosis not present

## 2021-09-23 DIAGNOSIS — G20 Parkinson's disease: Secondary | ICD-10-CM

## 2021-09-23 DIAGNOSIS — R471 Dysarthria and anarthria: Secondary | ICD-10-CM | POA: Diagnosis not present

## 2021-09-23 DIAGNOSIS — R490 Dysphonia: Secondary | ICD-10-CM | POA: Diagnosis not present

## 2021-09-23 NOTE — Therapy (Signed)
OUTPATIENT PHYSICAL THERAPY NEURO TREATMENT   Patient Name: Joseph Arroyo MRN: [id number] DOB:1940/08/19, 81 y.o., male Today's Date: 09/23/2021   PCP: Olin Hauser, DO REFERRING PROVIDER: Lavena Bullion, NP   PT End of Session - 09/23/21 1436     Visit Number 3    Number of Visits 25    Date for PT Re-Evaluation 11/13/21    Authorization Type [employer]na Medicare    Authorization Time Period Initial Cert 06/08/8117- 1/47/8295    Progress Note Due on Visit 10    PT Start Time 1440    PT Stop Time 1515    PT Time Calculation (min) 35 min    Equipment Utilized During Treatment Gait belt    Activity Tolerance Patient tolerated treatment well    Behavior During Therapy [facility] for tasks assessed/performed             Past Medical History:  Diagnosis Date   Allergic rhinitis due to pollen 11/21/2007   Brachial neuritis or radiculitis NOS    Cervicalgia    Concussion    age 47 - s/p accident   Costal chondritis    Depression    Essential and other specified forms of tremor    GERD (gastroesophageal reflux disease)    in past   Lumbago    Occlusion and stenosis of carotid artery without mention of cerebral infarction    Seizures [facility])    age 13 - after concussion   Past Surgical History:  Procedure Laterality Date   CATARACT EXTRACTION Right 07/18/14   CATARACT EXTRACTION W/PHACO Left 08/01/2014   Procedure: CATARACT EXTRACTION PHACO AND INTRAOCULAR LENS PLACEMENT ([place]);  Surgeon: Leandrew Koyanagi, MD;  Location: [place];  Service: Ophthalmology;  Laterality: Left;  IVA TOPICAL   COLONOSCOPY  2008   OTHER SURGICAL HISTORY  2002   ear surgery   STAPEDES SURGERY Right 2002   Patient Active Problem List   Diagnosis Date Noted   Dysphagia, pharyngeal phase 09/17/2021   Cervicalgia 08/05/2021   Spondylosis without myelopathy or radiculopathy, lumbosacral region 06/24/2021   Lumbosacral facet hypertrophy 06/03/2021   Lumbar central spinal  stenosis, w/o neurogenic claudication (L4-5) 06/03/2021   Lumbar lateral recess stenosis (Bilateral: L2-3, L4-5) (Right: L3-4) 06/03/2021   Lumbosacral foraminal stenosis (Bilateral: L2-3, L3-4) (Left: L5-S1) 06/03/2021   Lumbar nerve root impingement (Right: L3 at L2-3 & L3-4) 06/03/2021   Ligamentum flavum hypertrophy (L3-4, L4-5) 06/03/2021   Abnormal MRI, lumbar spine (04/23/2021) 04/24/2021   Long term prescription benzodiazepine use (alprazolam) (Xanax) 03/24/2021   Grade 1 Retrolisthesis of L2/L3 (5 mm) and L3/L4 (3 mm) 03/24/2021   Levoscoliosis of lumbar spine (L3-4 apex) 03/24/2021   DDD (degenerative disc disease), lumbosacral 03/24/2021   Lumbosacral facet arthropathy (Left: L3-4, L4-5, and L5-S1) 03/24/2021   Tricompartment osteoarthritis of knee (Left) 03/24/2021   Baker cyst (Left) 03/24/2021   Chronic low back pain (1ry area of Pain) (Bilateral) (R>L) w/o sciatica 03/24/2021   Lumbar facet syndrome (Bilateral) 03/24/2021   Chronic lower extremity pain (2ry area of Pain) (Bilateral) (L>R) 03/24/2021   Lumbosacral radiculitis/sensory radiculopathy at L2 (Bilateral) 03/24/2021   Lumbosacral radiculitis/sensory radiculopathy at L3 (Bilateral) 03/24/2021   Chronic pain syndrome 03/23/2021   Pharmacologic therapy 03/23/2021   Disorder of skeletal system 03/23/2021   Problems influencing health status 03/23/2021   PAD (peripheral artery disease) ([place]) 10/01/2020   GAD (generalized anxiety disorder) 01/13/2018   MDD (major depressive disorder) 62/13/0865   Umbilical hernia 78/46/9629   Chronic knee  pain (Left) 08/11/2017   Derangement of medial meniscus, posterior horn (Left) 08/11/2017   Vitamin D insufficiency 03/05/2016   BPH without obstruction/lower urinary tract symptoms 07/08/2015   Chronic fatigue 10/23/2014   Parkinson's disease ([place]) 09/20/2014   Major depression, recurrent, full remission ([place]) 07/26/2013   Unsteady gait 07/26/2013   Orthostatic hypotension  07/26/2013   Depression 07/26/2013   Anxiety 06/23/2013   Chronic anxiety 06/23/2013   Tremor 05/18/2013   Bilateral carotid artery stenosis 08/30/2012    ONSET DATE: 08/11/2021  REFERRING DIAG: Parkinsons Disease  THERAPY DIAG:  Muscle weakness (generalized)  Difficulty in walking, not elsewhere classified  Abnormality of gait and mobility  Unsteadiness on feet  Other abnormalities of gait and mobility  Rationale for Evaluation and Treatment Rehabilitation  SUBJECTIVE:                                                                                                                                                                                              SUBJECTIVE STATEMENT: Patient reports no falls or loss of balance since last session.  Patient reports continuing to go to the gym as he is able in order to keep his lower extremity strength.  Patient implored to continue to do this. Pt accompanied by: significant other  PERTINENT HISTORY: Per chart history provided by Dr. Consuela Mimes- Mr. Evitt has Parkinson's disease ([place]); Chronic knee pain (Left); Derangement of medial meniscus, posterior horn (Left); PAD (peripheral artery disease) ([place]); Chronic pain syndrome; Grade 1 Retrolisthesis of L2/L3 (5 mm) and L3/L4 (3 mm); Levoscoliosis of lumbar spine (L3-4 apex); DDD (degenerative disc disease), lumbosacral; Lumbosacral facet arthropathy (Left: L3-4, L4-5, and L5-S1); Tricompartment osteoarthritis of knee (Left); Baker cyst (Left); Chronic low back pain (1ry area of Pain) (Bilateral) (R>L) w/o sciatica; Lumbar facet syndrome (Bilateral); Chronic lower extremity pain (2ry area of Pain) (Bilateral) (L>R); Lumbosacral radiculitis/sensory radiculopathy at L2 (Bilateral); Lumbosacral radiculitis/sensory radiculopathy at L3 (Bilateral); Abnormal MRI, lumbar spine (04/23/2021); Lumbosacral facet hypertrophy; Lumbar central spinal stenosis, w/o neurogenic claudication (L4-5); Lumbar lateral  recess stenosis (Bilateral: L2-3, L4-5) (Right: L3-4); Lumbosacral foraminal stenosis (Bilateral: L2-3, L3-4) (Left: L5-S1); Lumbar nerve root impingement (Right: L3 at L2-3 & L3-4); Ligamentum flavum hypertrophy (L3-4, L4-5); Spondylosis without myelopathy or radiculopathy, lumbosacral region; and Cervicalgia on their pertinent problem list   Patient has appointment for Epidural to low back next Tues 08/26/2021  PAIN:  Are you having pain? Yes: NPRS scale: 5/10 Pain location: low back Pain description: ache/soreness Aggravating factors: prolonged standing, transfers, walking Relieving factors: rest and medications  PRECAUTIONS: Fall  WEIGHT BEARING RESTRICTIONS No  FALLS: Has patient fallen in  last 6 months? Yes. Number of falls 2  LIVING ENVIRONMENT: Lives with: lives with their spouse Lives in: House/apartment Stairs: 4 Has following equipment at home: Walker - 2 wheeled  PLOF: Needs assistance with ADLs- intermittent  PATIENT GOALS  to walk better and be as active as possible. Also to get in/out of cars better.   OBJECTIVE: (objective measures completed at initial evaluation unless otherwise dated)   DIAGNOSTIC FINDINGS: IMPRESSION: 1. Severe lumbar levoscoliosis with widespread advanced disc and facet degeneration. 2. Mild spinal stenosis at L4-5. 3. Moderate neural foraminal stenosis on the right at L3-4 and on the left at L5-S1. 4. Moderate right lateral recess stenosis at L2-3.     Electronically Signed   By: Logan Bores M.D.   On: 04/23/2021 16:41  COGNITION: Overall cognitive status: Within functional limits for tasks assessed   SENSATION: WFL  COORDINATION: Difficulty with coordination - slower response at times  EDEMA:  None  POSTURE: rounded shoulders, forward head, increased lumbar lordosis, and posterior pelvic tilt   LOWER EXTREMITY MMT:    MMT (evaluation)  Right Eval Left Eval  Hip flexion 4 4  Hip extension 4 4  Hip abduction 4 4   Hip adduction 4 4  Hip internal rotation 4 4  Hip external rotation 4 4  Knee flexion 4 4  Knee extension 4 4  Ankle dorsiflexion 4 4  Ankle plantarflexion 4 4  Ankle inversion 4 4  Ankle eversion 4 4  (Blank rows = not tested)  BED MOBILITY:  Not tested  TRANSFERS: Assistive device utilized: Environmental consultant - 2 wheeled  Sit to stand: Modified independence Stand to sit: SBA Chair to chair: SBA     FUNCTIONAL TESTS (EVALUATION):  5 times sit to stand: 14 sec without UE support Timed up and go (TUG): 15.67 sec with RW 10 meter walk test: 10.67 sec = 0.94 m/s using RW Berg Balance Scale: 48/56- see flowsheet for details  PATIENT SURVEYS:  FOTO : 49  TODAY'S TREATMENT 09/23/21  BP assessment: 131/58 Following Nustep x 4: 30, seated for 2 min   From last session  Seated BP: 77/64mHg    61bpm  Standing  x0 minutes 68/547mg      70bpm Standing x1 minute 79/5650m  (MAP 65)      69bpm  (no symptoms, no presyncope)   Nustep level 2 x 4 min with seat level  PWR! Basic 4 seated x 10 ea, handout provided  - minor discomfort with the PWR! Rock seated exercise.       PATIENT EDUCATION: Education details: need to monitor BP closely  Person educated: Patient Education method: Explanation, Demonstration, Tactile cues, and Verbal cues Education comprehension: verbalized understanding, returned demonstration, verbal cues required, tactile cues required, and needs further education   HOME EXERCISE PROGRAM: PWR! Basic 4 seated x 10 ea, handout provided  Balance interventions to be completed following further assessment of his orthostatic blood pressures.    GOALS: Goals reviewed with patient? Yes  SHORT TERM GOALS: Target date: 11/04/2021  Pt will be independent with initial HEP in order to improve strength and balance in order to decrease fall risk and improve function at home and work.  Baseline: 08/21/2021-No formal HEP in place Goal status: INITIAL    LONG TERM  GOALS: Target date: 12/16/2021  Pt will improve BERG by at least 3 points in order to demonstrate clinically significant improvement in balance.    Baseline: 08/21/2021= 48/56 Goal status: INITIAL  2.  Pt will be independent with final HEP in order to improve strength and balance in order to decrease fall risk and improve function at home and work.  Baseline: No HEP in place Goal status: INITIAL  3.  Pt will decrease 5TSTS by at least 4 seconds in order to demonstrate clinically significant improvement in LE strength. Baseline: 08/21/2021= 19.01 sec without UE support Goal status: INITIAL  4.  Pt will decrease TUG to below 14 seconds/decrease in order to demonstrate decreased fall risk. Baseline:  08/21/2021=15.67 with RW Goal status: INITIAL  5.  Pt will improve FOTO to target score of 56 to display perceived improvements in ability to complete ADL's.  Baseline: 08/21/2021=49 Goal status: INITIAL  6.  Pt will increase 10MWT by at least 0.15 m/s in order to demonstrate clinically significant improvement in community ambulation.   Baseline: 08/22/2021=0.94 m/s Goal status: INITIAL  ASSESSMENT:  CLINICAL IMPRESSION: Session started late secondary to patient last-minute add onto therapist schedule.  Patient tolerated treatment session well although did had some discomfort in his back that he describes is chronic in nature.  Patient continues to basic 4 seated Parkinson's wellness recovery exercises to begin his initial home exercise program.  Patient tolerated these well with some minor discomfort in his back with power rock.  Patient cannot to complete at home with it exacerbates his low back or upper back discomfort.Pt will continue to benefit from skilled physical therapy intervention to address impairments, improve QOL, and attain therapy goals.    OBJECTIVE IMPAIRMENTS Abnormal gait, decreased activity tolerance, decreased balance, decreased coordination, decreased endurance, decreased  knowledge of use of DME, decreased mobility, difficulty walking, decreased ROM, decreased strength, hypomobility, and impaired flexibility.   ACTIVITY LIMITATIONS carrying, lifting, bending, standing, squatting, sleeping, stairs, transfers, continence, bathing, toileting, and dressing  PARTICIPATION LIMITATIONS: cleaning, laundry, medication management, driving, shopping, community activity, and yard work  PERSONAL FACTORS Age are also affecting patient's functional outcome.   REHAB POTENTIAL: Good  CLINICAL DECISION MAKING: Stable/uncomplicated  EVALUATION COMPLEXITY: Low  PLAN: PT FREQUENCY: 2x/week  PT DURATION: 12 weeks  PLANNED INTERVENTIONS: Therapeutic exercises, Therapeutic activity, Neuromuscular re-education, Balance training, Gait training, Patient/Family education, Joint mobilization, Stair training, Vestibular training, Canalith repositioning, DME instructions, Dry Needling, Electrical stimulation, Spinal manipulation, and Spinal mobilization  PLAN FOR NEXT SESSION: continue balance training, monitor hypotension and orthostatics, ROM for flexibility,   Particia Lather, PT 09/23/2021, 2:38 PM  2:38 PM, 09/23/21

## 2021-09-23 NOTE — Therapy (Signed)
OUTPATIENT SPEECH LANGUAGE PATHOLOGY PARKINSON'S Treatment   Patient Name: Joseph Arroyo MRN: [id number] DOB:[birth year], 81 y.o., male Today's Date: 09/23/2021  PCP: Dr Nobie Putnam REFERRING PROVIDER: Dr Lavena Bullion   End of Session - 09/23/21 1553     Visit Number 9    Number of Visits 25    Date for SLP Re-Evaluation 11/12/21    Authorization Type A[employer]na Medicare/HMO PPO    Progress Note Due on Visit 10    SLP Start Time 32    SLP Stop Time  1400    SLP Time Calculation (min) 60 min             Past Medical History:  Diagnosis Date   Allergic rhinitis due to pollen 11/21/2007   Brachial neuritis or radiculitis NOS    Cervicalgia    Concussion    age 81 - s/p accident   Costal chondritis    Depression    Essential and other specified forms of tremor    GERD (gastroesophageal reflux disease)    in past   Lumbago    Occlusion and stenosis of carotid artery without mention of cerebral infarction    Seizures [facility])    age 81 - after concussion   Past Surgical History:  Procedure Laterality Date   CATARACT EXTRACTION Right 07/18/14   CATARACT EXTRACTION W/PHACO Left 08/01/2014   Procedure: CATARACT EXTRACTION PHACO AND INTRAOCULAR LENS PLACEMENT ([place]);  Surgeon: Leandrew Koyanagi, MD;  Location: [place];  Service: Ophthalmology;  Laterality: Left;  IVA TOPICAL   COLONOSCOPY  2008   OTHER SURGICAL HISTORY  2002   ear surgery   STAPEDES SURGERY Right 2002   Patient Active Problem List   Diagnosis Date Noted   Dysphagia, pharyngeal phase 09/17/2021   Cervicalgia 08/05/2021   Spondylosis without myelopathy or radiculopathy, lumbosacral region 06/24/2021   Lumbosacral facet hypertrophy 06/03/2021   Lumbar central spinal stenosis, w/o neurogenic claudication (L4-5) 06/03/2021   Lumbar lateral recess stenosis (Bilateral: L2-3, L4-5) (Right: L3-4) 06/03/2021   Lumbosacral foraminal stenosis (Bilateral: L2-3, L3-4) (Left: L5-S1)  06/03/2021   Lumbar nerve root impingement (Right: L3 at L2-3 & L3-4) 06/03/2021   Ligamentum flavum hypertrophy (L3-4, L4-5) 06/03/2021   Abnormal MRI, lumbar spine (04/23/2021) 04/24/2021   Long term prescription benzodiazepine use (alprazolam) (Xanax) 03/24/2021   Grade 1 Retrolisthesis of L2/L3 (5 mm) and L3/L4 (3 mm) 03/24/2021   Levoscoliosis of lumbar spine (L3-4 apex) 03/24/2021   DDD (degenerative disc disease), lumbosacral 03/24/2021   Lumbosacral facet arthropathy (Left: L3-4, L4-5, and L5-S1) 03/24/2021   Tricompartment osteoarthritis of knee (Left) 03/24/2021   Baker cyst (Left) 03/24/2021   Chronic low back pain (1ry area of Pain) (Bilateral) (R>L) w/o sciatica 03/24/2021   Lumbar facet syndrome (Bilateral) 03/24/2021   Chronic lower extremity pain (2ry area of Pain) (Bilateral) (L>R) 03/24/2021   Lumbosacral radiculitis/sensory radiculopathy at L2 (Bilateral) 03/24/2021   Lumbosacral radiculitis/sensory radiculopathy at L3 (Bilateral) 03/24/2021   Chronic pain syndrome 03/23/2021   Pharmacologic therapy 03/23/2021   Disorder of skeletal system 03/23/2021   Problems influencing health status 03/23/2021   PAD (peripheral artery disease) ([place]) 10/01/2020   GAD (generalized anxiety disorder) 01/13/2018   MDD (major depressive disorder) 80/17/0017   Umbilical hernia 80/44/9675   Chronic knee pain (Left) 08/11/2017   Derangement of medial meniscus, posterior horn (Left) 08/11/2017   Vitamin D insufficiency 03/05/2016   BPH without obstruction/lower urinary tract symptoms 07/08/2015   Chronic fatigue 10/23/2014   Parkinson's disease ([place])  09/20/2014   Major depression, recurrent, full remission ([place]) 07/26/2013   Unsteady gait 07/26/2013   Orthostatic hypotension 07/26/2013   Depression 07/26/2013   Anxiety 06/23/2013   Chronic anxiety 06/23/2013   Tremor 05/18/2013   Bilateral carotid artery stenosis 08/30/2012    ONSET DATE: 05/28/2015; referral date  08/11/2021  REFERRING DIAG: G20 (ICD-10-CM) - Parkinson's disease  THERAPY DIAG:  Dysarthria and anarthria  Cognitive communication deficit  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 Yes: NPRS scale: 2/10 Pain location: back Pain description: lower; right; left Aggravating factors: sitting Relieving factors: under care of pain doctor; receiving epidurals  PATIENT GOALS To be able to change the oil in his car; drive his motorcycle again  OBJECTIVE:   TODAY'S TREATMENT:  Skilled treatment session focused on pt's dysarthria goals. SLP facilitated session by providing maximal multimodal assistance including using straw to blow into water to alternate intensity levels. Pt able to progresses to "hummed hills" but unable to progress further. Suspect this is related to skeletal structure and habitual clavicle breathing. He was however able to achieve a good quality loud voice when reading list of words using resonant voice principles. With moderate assistance to slow rate, pt increase his speech intelligibility with the list to 90%. Uncertain if pt will be able to improve sub-optimal breath support during phonation.   The Communication Effectiveness Survey is a patient-reported outcome measure in which the patient rates their own effectiveness in different communication situations. A higher score indicates greater effectiveness. Pt's self-rating was 11/32.   PATIENT EDUCATION: Education details: do not engage in conversation in the mornings until he is able to achieve increased speech intelligibility after drinking water, consuming his breakfast and his medications Person educated: Patient  and Spouse Education method: Explanation, Demonstration, and Verbal cues Education comprehension: verbalized understanding and needs further education       GOALS: Goals reviewed with patient? Yes  SHORT TERM GOALS: Target date: 10 therapy sessions  The patient produce "ah", High/Lows, and Functional Phrases at average loudness >/= 80 dB and with loud, good quality voice with moderate cues.             Baseline: maximal cues Goal status: INITIAL  2.  The patient will complete reading drills (words/phrases, sentences) at average >/= 75 dB and with loud, good quality voice with moderate cues.  Baseline: maximal cues Goal status: INITIAL  3.   The patient will participate in 5-8 minutes conversation, maintaining average loudness of 75 dB and loud, good quality voice with min cues.  Baseline: maximal cues Goal status: INITIAL  4.  The patient will complete an instrumental evaluation (MBSS) within 3 weeks in order to evaluate swallowing safety. Baseline: last MBSS 2017 Goal status: Met   LONG TERM GOALS: Target date: 11/12/2021    The patient will participate in 15-20 minutes conversation, maintaining average loudness of 75 dB and loud, good quality voice. Baseline: 60dB Goal status: INITIAL  2.   Patient will report improved communication effectiveness as measured by Communicative Effectiveness Survey.   Baseline:  Goal status: INITIAL  3.  Pt will consume least restrictive diet with no s/s of respiratory decline.  Baseline: new goal Goal status: Met   ASSESSMENT:  CLINICAL IMPRESSION: Pt presents  with moderate hypokinetic dysarthria c/b low vocal intensity, fast rate and imprecise articulation. Pt is responding with improved loudness and vocal quality when engaging in resonant voice therapy.   OBJECTIVE IMPAIRMENTS  Objective impairments include voice disorder and dysphagia. These impairments are limiting patient from effectively communicating at home and in community  and safety when swallowing.Factors affecting potential to achieve goals and functional outcome are co-morbidities, medical prognosis, pain level, and severity of impairments.Patient will benefit from skilled SLP services to address above impairments and improve overall function.  REHAB POTENTIAL: Fair multiple previous ST sessions; progressive severe chronic back pain  PLAN: SLP FREQUENCY: 2x/week  SLP DURATION: 12 weeks  PLANNED INTERVENTIONS: Aspiration precaution training, Pharyngeal strengthening exercises, Diet toleration management , Trials of upgraded texture/liquids, SLP instruction and feedback, and Compensatory strategies   Boyd Litaker B. Rutherford Nail, M.S., CCC-SLP, Mining engineer Certified Brain Injury [place]  [place] Office [phone] Ascom [phone] Fax [phone]

## 2021-09-25 ENCOUNTER — Ambulatory Visit: Payer: Medicare HMO | Admitting: Physical Therapy

## 2021-09-25 ENCOUNTER — Ambulatory Visit: Payer: Medicare HMO | Admitting: Speech Pathology

## 2021-09-25 ENCOUNTER — Ambulatory Visit: Payer: Medicare HMO

## 2021-09-25 DIAGNOSIS — R2681 Unsteadiness on feet: Secondary | ICD-10-CM

## 2021-09-25 DIAGNOSIS — R1312 Dysphagia, oropharyngeal phase: Secondary | ICD-10-CM | POA: Diagnosis not present

## 2021-09-25 DIAGNOSIS — R262 Difficulty in walking, not elsewhere classified: Secondary | ICD-10-CM | POA: Diagnosis not present

## 2021-09-25 DIAGNOSIS — G20 Parkinson's disease: Secondary | ICD-10-CM

## 2021-09-25 DIAGNOSIS — R490 Dysphonia: Secondary | ICD-10-CM | POA: Diagnosis not present

## 2021-09-25 DIAGNOSIS — R269 Unspecified abnormalities of gait and mobility: Secondary | ICD-10-CM | POA: Diagnosis not present

## 2021-09-25 DIAGNOSIS — R41841 Cognitive communication deficit: Secondary | ICD-10-CM | POA: Diagnosis not present

## 2021-09-25 DIAGNOSIS — R471 Dysarthria and anarthria: Secondary | ICD-10-CM

## 2021-09-25 DIAGNOSIS — R2689 Other abnormalities of gait and mobility: Secondary | ICD-10-CM

## 2021-09-25 DIAGNOSIS — R278 Other lack of coordination: Secondary | ICD-10-CM | POA: Diagnosis not present

## 2021-09-25 DIAGNOSIS — M6281 Muscle weakness (generalized): Secondary | ICD-10-CM | POA: Diagnosis not present

## 2021-09-25 NOTE — Therapy (Signed)
OUTPATIENT PHYSICAL THERAPY NEURO TREATMENT   Patient Name: Joseph Arroyo MRN: [id number] DOB:1940-07-17, 81 y.o., male Today's Date: 09/25/2021   PCP: Olin Hauser, DO REFERRING PROVIDER: Lavena Bullion, NP   PT End of Session - 09/25/21 1305     Visit Number 4    Number of Visits 25    Date for PT Re-Evaluation 11/13/21    Authorization Type A[employer]na Medicare    Authorization Time Period Initial Cert 8/67/6720- 9/47/0962    Progress Note Due on Visit 10    PT Start Time 1302    PT Stop Time 1345    PT Time Calculation (min) 43 min    Equipment Utilized During Treatment Gait belt    Activity Tolerance Patient tolerated treatment well    Behavior During Therapy [facility] for tasks assessed/performed              Past Medical History:  Diagnosis Date   Allergic rhinitis due to pollen 11/21/2007   Brachial neuritis or radiculitis NOS    Cervicalgia    Concussion    age 81 - s/p accident   Costal chondritis    Depression    Essential and other specified forms of tremor    GERD (gastroesophageal reflux disease)    in past   Lumbago    Occlusion and stenosis of carotid artery without mention of cerebral infarction    Seizures [facility])    age 27 - after concussion   Past Surgical History:  Procedure Laterality Date   CATARACT EXTRACTION Right 07/18/14   CATARACT EXTRACTION W/PHACO Left 08/01/2014   Procedure: CATARACT EXTRACTION PHACO AND INTRAOCULAR LENS PLACEMENT ([place]);  Surgeon: Leandrew Koyanagi, MD;  Location: [place];  Service: Ophthalmology;  Laterality: Left;  IVA TOPICAL   COLONOSCOPY  2008   OTHER SURGICAL HISTORY  2002   ear surgery   STAPEDES SURGERY Right 2002   Patient Active Problem List   Diagnosis Date Noted   Dysphagia, pharyngeal phase 09/17/2021   Cervicalgia 08/05/2021   Spondylosis without myelopathy or radiculopathy, lumbosacral region 06/24/2021   Lumbosacral facet hypertrophy 06/03/2021   Lumbar central spinal  stenosis, w/o neurogenic claudication (L4-5) 06/03/2021   Lumbar lateral recess stenosis (Bilateral: L2-3, L4-5) (Right: L3-4) 06/03/2021   Lumbosacral foraminal stenosis (Bilateral: L2-3, L3-4) (Left: L5-S1) 06/03/2021   Lumbar nerve root impingement (Right: L3 at L2-3 & L3-4) 06/03/2021   Ligamentum flavum hypertrophy (L3-4, L4-5) 06/03/2021   Abnormal MRI, lumbar spine (04/23/2021) 04/24/2021   Long term prescription benzodiazepine use (alprazolam) (Xanax) 03/24/2021   Grade 1 Retrolisthesis of L2/L3 (5 mm) and L3/L4 (3 mm) 03/24/2021   Levoscoliosis of lumbar spine (L3-4 apex) 03/24/2021   DDD (degenerative disc disease), lumbosacral 03/24/2021   Lumbosacral facet arthropathy (Left: L3-4, L4-5, and L5-S1) 03/24/2021   Tricompartment osteoarthritis of knee (Left) 03/24/2021   Baker cyst (Left) 03/24/2021   Chronic low back pain (1ry area of Pain) (Bilateral) (R>L) w/o sciatica 03/24/2021   Lumbar facet syndrome (Bilateral) 03/24/2021   Chronic lower extremity pain (2ry area of Pain) (Bilateral) (L>R) 03/24/2021   Lumbosacral radiculitis/sensory radiculopathy at L2 (Bilateral) 03/24/2021   Lumbosacral radiculitis/sensory radiculopathy at L3 (Bilateral) 03/24/2021   Chronic pain syndrome 03/23/2021   Pharmacologic therapy 03/23/2021   Disorder of skeletal system 03/23/2021   Problems influencing health status 03/23/2021   PAD (peripheral artery disease) ([place]) 10/01/2020   GAD (generalized anxiety disorder) 01/13/2018   MDD (major depressive disorder) 83/66/2947   Umbilical hernia 65/46/5035   Chronic  knee pain (Left) 08/11/2017   Derangement of medial meniscus, posterior horn (Left) 08/11/2017   Vitamin D insufficiency 03/05/2016   BPH without obstruction/lower urinary tract symptoms 07/08/2015   Chronic fatigue 10/23/2014   Parkinson's disease ([place]) 09/20/2014   Major depression, recurrent, full remission ([place]) 07/26/2013   Unsteady gait 07/26/2013   Orthostatic hypotension  07/26/2013   Depression 07/26/2013   Anxiety 06/23/2013   Chronic anxiety 06/23/2013   Tremor 05/18/2013   Bilateral carotid artery stenosis 08/30/2012    ONSET DATE: 08/11/2021  REFERRING DIAG: Parkinsons Disease  THERAPY DIAG:  Difficulty in walking, not elsewhere classified  Abnormality of gait and mobility  Unsteadiness on feet  Other abnormalities of gait and mobility  Rationale for Evaluation and Treatment Rehabilitation  SUBJECTIVE:                                                                                                                                                                                              SUBJECTIVE STATEMENT: Patient reports no falls or loss of balance since last session. Pt reports he had significant low back pain following last treatment session. Low back pain monitored closely throughout this session. Pt accompanied by: significant other  PERTINENT HISTORY: Per chart history provided by Dr. Consuela Mimes- Mr. Quach has Parkinson's disease ([place]); Chronic knee pain (Left); Derangement of medial meniscus, posterior horn (Left); PAD (peripheral artery disease) ([place]); Chronic pain syndrome; Grade 1 Retrolisthesis of L2/L3 (5 mm) and L3/L4 (3 mm); Levoscoliosis of lumbar spine (L3-4 apex); DDD (degenerative disc disease), lumbosacral; Lumbosacral facet arthropathy (Left: L3-4, L4-5, and L5-S1); Tricompartment osteoarthritis of knee (Left); Baker cyst (Left); Chronic low back pain (1ry area of Pain) (Bilateral) (R>L) w/o sciatica; Lumbar facet syndrome (Bilateral); Chronic lower extremity pain (2ry area of Pain) (Bilateral) (L>R); Lumbosacral radiculitis/sensory radiculopathy at L2 (Bilateral); Lumbosacral radiculitis/sensory radiculopathy at L3 (Bilateral); Abnormal MRI, lumbar spine (04/23/2021); Lumbosacral facet hypertrophy; Lumbar central spinal stenosis, w/o neurogenic claudication (L4-5); Lumbar lateral recess stenosis (Bilateral: L2-3, L4-5) (Right:  L3-4); Lumbosacral foraminal stenosis (Bilateral: L2-3, L3-4) (Left: L5-S1); Lumbar nerve root impingement (Right: L3 at L2-3 & L3-4); Ligamentum flavum hypertrophy (L3-4, L4-5); Spondylosis without myelopathy or radiculopathy, lumbosacral region; and Cervicalgia on their pertinent problem list   Patient has appointment for Epidural to low back next Tues 08/26/2021  PAIN:  Are you having pain? Yes: NPRS scale: 5/10 Pain location: low back Pain description: ache/soreness Aggravating factors: prolonged standing, transfers, walking Relieving factors: rest and medications  PRECAUTIONS: Fall  WEIGHT BEARING RESTRICTIONS No  FALLS: Has patient fallen in last 6 months? Yes. Number of falls 2  LIVING ENVIRONMENT: Lives  with: lives with their spouse Lives in: House/apartment Stairs: 4 Has following equipment at home: [place] - 2 wheeled  PLOF: Needs assistance with ADLs- intermittent  PATIENT GOALS  to walk better and be as active as possible. Also to get in/out of cars better.   OBJECTIVE: (objective measures completed at initial evaluation unless otherwise dated)   DIAGNOSTIC FINDINGS: IMPRESSION: 1. Severe lumbar levoscoliosis with widespread advanced disc and facet degeneration. 2. Mild spinal stenosis at L4-5. 3. Moderate neural foraminal stenosis on the right at L3-4 and on the left at L5-S1. 4. Moderate right lateral recess stenosis at L2-3.     Electronically Signed   By: Logan Bores M.D.   On: 04/23/2021 16:41  COGNITION: Overall cognitive status: Within functional limits for tasks assessed   SENSATION: WFL  COORDINATION: Difficulty with coordination - slower response at times  EDEMA:  None  POSTURE: rounded shoulders, forward head, increased lumbar lordosis, and posterior pelvic tilt   LOWER EXTREMITY MMT:    MMT (evaluation)  Right Eval Left Eval  Hip flexion 4 4  Hip extension 4 4  Hip abduction 4 4  Hip adduction 4 4  Hip internal rotation 4 4   Hip external rotation 4 4  Knee flexion 4 4  Knee extension 4 4  Ankle dorsiflexion 4 4  Ankle plantarflexion 4 4  Ankle inversion 4 4  Ankle eversion 4 4  (Blank rows = not tested)  BED MOBILITY:  Not tested  TRANSFERS: Assistive device utilized: Environmental consultant - 2 wheeled  Sit to stand: Modified independence Stand to sit: SBA Chair to chair: SBA     FUNCTIONAL TESTS (EVALUATION):  5 times sit to stand: 14 sec without UE support Timed up and go (TUG): 15.67 sec with RW 10 meter walk test: 10.67 sec = 0.94 m/s using RW Berg Balance Scale: 48/56- see flowsheet for details  PATIENT SURVEYS:  FOTO : 49  TODAY'S TREATMENT 09/25/21  Following Nustep x 5 min , cues for controlled movement, LE only, level 3, MHP applied to low back for warm up.   TE Nustep level 2 x 5 min with MHP applied to low back region throughout   Ball roll outs x 6 each direction ( 3 way)  PWR! Basic 4 seated x 10 ea, handout provided  - minor discomfort with the PWR! Rock seated exercise last session. Did not complete this date and eliminated from HEP at this time.    NMR:   Static 1 LE on floor 1 LE on step 2 x 30 sec ea  Semitandem stance 2 * 30 sec ea   Marching x 10 ea LE   PWR! Step standing x 10 ea direction, minor discomfort in LB ( increased pain from 5 to 6/ 10). Cues for maintaining erect posture to prevent lateral side bending with R step and this improved pain response slightly.        PATIENT EDUCATION: Education details: need to monitor BP closely  Person educated: Patient Education method: Explanation, Demonstration, Tactile cues, and Verbal cues Education comprehension: verbalized understanding, returned demonstration, verbal cues required, tactile cues required, and needs further education   HOME EXERCISE PROGRAM: PWR! Basic 4 seated x 10 ea, handout provided: addended to not perform seated PRW! Rock exercise and added the below to program on 09/25/21  Access Code:  [email or web address] URL: [email or web address] Date: 09/25/2021 Prepared by: Rivka Barbara  Exercises - Standing Romberg to 3/4 Tandem Stance  - 1 x daily - 7 x  weekly - 2 sets - 30 seocnd hold - Standing March with Counter Support  - 1 x daily - 7 x weekly - 2 sets - 10 reps - Modified Single Leg Balance on Step  - 1 x daily - 7 x weekly - 2 sets - 30 second hold    GOALS: Goals reviewed with patient? Yes  SHORT TERM GOALS: Target date: 10/02/2021  Pt will be independent with initial HEP in order to improve strength and balance in order to decrease fall risk and improve function at home and work.  Baseline: 08/21/2021-No formal HEP in place Goal status: INITIAL    LONG TERM GOALS: Target date: 9/14//2023  Pt will improve BERG by at least 3 points in order to demonstrate clinically significant improvement in balance.    Baseline: 08/21/2021= 48/56 Goal status: INITIAL  2.  Pt will be independent with final HEP in order to improve strength and balance in order to decrease fall risk and improve function at home and work.  Baseline: No HEP in place Goal status: INITIAL  3.  Pt will decrease 5TSTS by at least 4 seconds in order to demonstrate clinically significant improvement in LE strength. Baseline: 08/21/2021= 19.01 sec without UE support Goal status: INITIAL  4.  Pt will decrease TUG to below 14 seconds/decrease in order to demonstrate decreased fall risk. Baseline:  08/21/2021=15.67 with RW Goal status: INITIAL  5.  Pt will improve FOTO to target score of 56 to display perceived improvements in ability to complete ADL's.  Baseline: 08/21/2021=49 Goal status: INITIAL  6.  Pt will increase 10MWT by at least 0.15 m/s in order to demonstrate clinically significant improvement in community ambulation.   Baseline: 08/22/2021=0.94 m/s Goal status: INITIAL  ASSESSMENT:  CLINICAL IMPRESSION: Continued with current plan of care as laid out in evaluation and recent prior  sessions. Pt remains motivated to advance progress toward goals in order to maximize independence and safety at home. Pt requires high level assistance and cuing for completion of exercises in order to provide adequate level of stimulation and perturbation. Author allows pt as much opportunity as possible to perform independent righting strategies, only stepping in when pt is unable to prevent falling to floor. Pt closely monitored throughout session for safe vitals response and to maximize patient safety during interventions. Added balance interventions to HEP as appropriate and modified current HEP to better accommodate his LBP. Pt continues to demonstrate progress toward goals AEB progression of some interventions this date either in volume or intensity.    OBJECTIVE IMPAIRMENTS Abnormal gait, decreased activity tolerance, decreased balance, decreased coordination, decreased endurance, decreased knowledge of use of DME, decreased mobility, difficulty walking, decreased ROM, decreased strength, hypomobility, and impaired flexibility.   ACTIVITY LIMITATIONS carrying, lifting, bending, standing, squatting, sleeping, stairs, transfers, continence, bathing, toileting, and dressing  PARTICIPATION LIMITATIONS: cleaning, laundry, medication management, driving, shopping, community activity, and yard work  PERSONAL FACTORS Age are also affecting patient's functional outcome.   REHAB POTENTIAL: Good  CLINICAL DECISION MAKING: Stable/uncomplicated  EVALUATION COMPLEXITY: Low  PLAN: PT FREQUENCY: 2x/week  PT DURATION: 12 weeks  PLANNED INTERVENTIONS: Therapeutic exercises, Therapeutic activity, Neuromuscular re-education, Balance training, Gait training, Patient/Family education, Joint mobilization, Stair training, Vestibular training, Canalith repositioning, DME instructions, Dry Needling, Electrical stimulation, Spinal manipulation, and Spinal mobilization  PLAN FOR NEXT SESSION: continue balance  training, monitor hypotension and orthostatics, ROM for flexibility,   Particia Lather, PT 09/25/2021, 2:05 PM  2:05 PM, 09/25/21

## 2021-09-28 NOTE — Therapy (Signed)
OUTPATIENT SPEECH LANGUAGE PATHOLOGY [facility] Treatment 10th SESSION PROGRESS NOTE   Patient Name: Joseph Arroyo MRN: [id number] DOB:[birth year], 81 y.o., male Today's Date: 09/28/2021  PCP: Dr Nobie Putnam REFERRING PROVIDER: Dr Lavena Bullion   End of Session - 09/28/21 1209     Visit Number 10    Number of Visits 25    Date for SLP Re-Evaluation 11/12/21    Authorization Type Aetna Medicare/HMO PPO    Progress Note Due on Visit 10    SLP Start Time 1400    SLP Stop Time  1500    SLP Time Calculation (min) 60 min    Activity Tolerance Patient tolerated treatment well             Past Medical History:  Diagnosis Date   Allergic rhinitis due to pollen 11/21/2007   Brachial neuritis or radiculitis NOS    Cervicalgia    Concussion    age 74 - s/p accident   Costal chondritis    Depression    Essential and other specified forms of tremor    GERD (gastroesophageal reflux disease)    in past   Lumbago    Occlusion and stenosis of carotid artery without mention of cerebral infarction    Seizures [facility])    age 47 - after concussion   Past Surgical History:  Procedure Laterality Date   CATARACT EXTRACTION Right 07/18/14   CATARACT EXTRACTION W/PHACO Left 08/01/2014   Procedure: CATARACT EXTRACTION PHACO AND INTRAOCULAR LENS PLACEMENT ([place]);  Surgeon: Leandrew Koyanagi, MD;  Location: [place];  Service: Ophthalmology;  Laterality: Left;  IVA TOPICAL   COLONOSCOPY  2008   OTHER SURGICAL HISTORY  2002   ear surgery   STAPEDES SURGERY Right 2002   Patient Active Problem List   Diagnosis Date Noted   Dysphagia, pharyngeal phase 09/17/2021   Cervicalgia 08/05/2021   Spondylosis without myelopathy or radiculopathy, lumbosacral region 06/24/2021   Lumbosacral facet hypertrophy 06/03/2021   Lumbar central spinal stenosis, w/o neurogenic claudication (L4-5) 06/03/2021   Lumbar lateral recess stenosis (Bilateral: L2-3, L4-5) (Right: L3-4)  06/03/2021   Lumbosacral foraminal stenosis (Bilateral: L2-3, L3-4) (Left: L5-S1) 06/03/2021   Lumbar nerve root impingement (Right: L3 at L2-3 & L3-4) 06/03/2021   Ligamentum flavum hypertrophy (L3-4, L4-5) 06/03/2021   Abnormal MRI, lumbar spine (04/23/2021) 04/24/2021   Long term prescription benzodiazepine use (alprazolam) (Xanax) 03/24/2021   Grade 1 Retrolisthesis of L2/L3 (5 mm) and L3/L4 (3 mm) 03/24/2021   Levoscoliosis of lumbar spine (L3-4 apex) 03/24/2021   DDD (degenerative disc disease), lumbosacral 03/24/2021   Lumbosacral facet arthropathy (Left: L3-4, L4-5, and L5-S1) 03/24/2021   Tricompartment osteoarthritis of knee (Left) 03/24/2021   Baker cyst (Left) 03/24/2021   Chronic low back pain (1ry area of Pain) (Bilateral) (R>L) w/o sciatica 03/24/2021   Lumbar facet syndrome (Bilateral) 03/24/2021   Chronic lower extremity pain (2ry area of Pain) (Bilateral) (L>R) 03/24/2021   Lumbosacral radiculitis/sensory radiculopathy at L2 (Bilateral) 03/24/2021   Lumbosacral radiculitis/sensory radiculopathy at L3 (Bilateral) 03/24/2021   Chronic pain syndrome 03/23/2021   Pharmacologic therapy 03/23/2021   Disorder of skeletal system 03/23/2021   Problems influencing health status 03/23/2021   PAD (peripheral artery disease) ([place]) 10/01/2020   GAD (generalized anxiety disorder) 01/13/2018   MDD (major depressive disorder) 23/55/7322   Umbilical hernia 02/54/2706   Chronic knee pain (Left) 08/11/2017   Derangement of medial meniscus, posterior horn (Left) 08/11/2017   Vitamin D insufficiency 03/05/2016   BPH without obstruction/lower urinary  tract symptoms 07/08/2015   Chronic fatigue 10/23/2014   Parkinson's disease ([place]) 09/20/2014   Major depression, recurrent, full remission ([place]) 07/26/2013   Unsteady gait 07/26/2013   Orthostatic hypotension 07/26/2013   Depression 07/26/2013   Anxiety 06/23/2013   Chronic anxiety 06/23/2013   Tremor 05/18/2013   Bilateral carotid  artery stenosis 08/30/2012    ONSET DATE: 05/28/2015; referral date 08/11/2021  REFERRING DIAG: G20 (ICD-10-CM) - Parkinson's disease  THERAPY DIAG:  Cognitive communication deficit  Dysarthria and anarthria  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 Yes: NPRS scale: 2/10 Pain location: back Pain description: lower; right; left Aggravating factors: sitting Relieving factors: under care of pain doctor; receiving epidurals  PATIENT GOALS pt's wife states her goal as "so other people can understand him"  OBJECTIVE:   TODAY'S TREATMENT:  Skilled treatment session focused on pt's dysarthria goals. Pt was Mod I with use of speech intelligibility strategies to achieve > 85% at the conversation level. Occasionally, pt's conversation is difficult to follow d/t tangential topics.   The Communication Effectiveness Survey is a patient-reported outcome measure in which the patient rates their own effectiveness in different communication situations. A higher score indicates greater effectiveness. Pt's self-rating was 11/32.   PATIENT EDUCATION: Education details: do not engage in conversation in the mornings until he is able to achieve increased speech intelligibility after drinking water, consuming his breakfast and his medications Person educated: Patient and Spouse Education method: Explanation, Demonstration, and Verbal cues Education comprehension: verbalized understanding and needs further education       GOALS: Goals reviewed with patient? Yes  SHORT TERM GOALS: Target date: 10 therapy sessions  The patient produce "ah", High/Lows, and Functional Phrases at average loudness >/= 80 dB and with loud, good quality voice with  moderate cues.             Baseline: maximal cues Goal status: MET  2.  The patient will complete reading drills (words/phrases, sentences) at average >/= 75 dB and with loud, good quality voice with moderate cues.  Baseline: maximal cues Goal status: MET  3.   The patient will participate in 5-8 minutes conversation, maintaining average loudness of 75 dB and loud, good quality voice with min cues.  Baseline: maximal cues Goal status: Ongoing  4.  The patient will complete an instrumental evaluation (MBSS) within 3 weeks in order to evaluate swallowing safety. Baseline: last MBSS 2017 Goal status: Met   LONG TERM GOALS: Target date: 11/12/2021    The patient will participate in 15-20 minutes conversation, maintaining average loudness of 75 dB and loud, good quality voice. Baseline: 60dB Goal status: Ongoing  2.   Patient will report improved communication effectiveness as measured by Communicative Effectiveness Survey.   Baseline: 11/32 Goal status: Ongoing  3.  Pt will consume least restrictive diet with no s/s of respiratory decline.  Baseline: new goal Goal status: Met   ASSESSMENT:  CLINICAL IMPRESSION: Pt has made slow steady progress towards his goals and as such his conversational speech intelligibility is improved. Pt is limited by sub-optimal breath support and attention to speech intelligibility strategies. Pt's wife reports that while others may ask pt to repeat himself, he is able to improve his speech intelligibility with that cue.   OBJECTIVE IMPAIRMENTS  Objective impairments  include voice disorder and dysphagia. These impairments are limiting patient from effectively communicating at home and in community and safety when swallowing.Factors affecting potential to achieve goals and functional outcome are co-morbidities, medical prognosis, pain level, and severity of impairments.Patient will benefit from skilled SLP services to address above impairments and improve  overall function.  REHAB POTENTIAL: Fair multiple previous ST sessions; progressive severe chronic back pain  PLAN: SLP FREQUENCY: 2x/week  SLP DURATION: 12 weeks  PLANNED INTERVENTIONS: Aspiration precaution training, Pharyngeal strengthening exercises, Diet toleration management , Trials of upgraded texture/liquids, SLP instruction and feedback, and Compensatory strategies   Treyshawn Muldrew B. Rutherford Nail, M.S., CCC-SLP, Mining engineer Certified Brain Injury [place]  [place] Office [phone] Ascom [phone] Fax [phone]

## 2021-09-30 ENCOUNTER — Ambulatory Visit: Payer: Medicare HMO

## 2021-09-30 ENCOUNTER — Ambulatory Visit: Payer: Medicare HMO | Attending: Neurology | Admitting: Speech Pathology

## 2021-09-30 ENCOUNTER — Encounter: Payer: Medicare HMO | Admitting: Speech Pathology

## 2021-09-30 DIAGNOSIS — R490 Dysphonia: Secondary | ICD-10-CM | POA: Diagnosis not present

## 2021-09-30 DIAGNOSIS — R2681 Unsteadiness on feet: Secondary | ICD-10-CM | POA: Insufficient documentation

## 2021-09-30 DIAGNOSIS — R278 Other lack of coordination: Secondary | ICD-10-CM

## 2021-09-30 DIAGNOSIS — R471 Dysarthria and anarthria: Secondary | ICD-10-CM | POA: Diagnosis not present

## 2021-09-30 DIAGNOSIS — R262 Difficulty in walking, not elsewhere classified: Secondary | ICD-10-CM | POA: Insufficient documentation

## 2021-09-30 DIAGNOSIS — G20 Parkinson's disease: Secondary | ICD-10-CM | POA: Diagnosis not present

## 2021-09-30 DIAGNOSIS — R2689 Other abnormalities of gait and mobility: Secondary | ICD-10-CM | POA: Insufficient documentation

## 2021-09-30 DIAGNOSIS — R269 Unspecified abnormalities of gait and mobility: Secondary | ICD-10-CM | POA: Insufficient documentation

## 2021-09-30 DIAGNOSIS — M6281 Muscle weakness (generalized): Secondary | ICD-10-CM | POA: Insufficient documentation

## 2021-09-30 NOTE — Therapy (Signed)
OUTPATIENT SPEECH LANGUAGE PATHOLOGY PARKINSON'S Treatment    Patient Name: Joseph Arroyo MRN: [id number] DOB:1940/04/19, 81 y.o., male Today's Date: 09/30/2021  PCP: Dr Nobie Putnam REFERRING PROVIDER: Dr Lavena Bullion   End of Session - 09/30/21 1410     Visit Number 11    Number of Visits 25    Date for SLP Re-Evaluation 11/12/21    Authorization Type [employer] Medicare/HMO PPO    Progress Note Due on Visit 10    SLP Start Time 1400    SLP Stop Time  1500    SLP Time Calculation (min) 60 min    Activity Tolerance Patient tolerated treatment well             Past Medical History:  Diagnosis Date   Allergic rhinitis due to pollen 11/21/2007   Brachial neuritis or radiculitis NOS    Cervicalgia    Concussion    age 58 - s/p accident   Costal chondritis    Depression    Essential and other specified forms of tremor    GERD (gastroesophageal reflux disease)    in past   Lumbago    Occlusion and stenosis of carotid artery without mention of cerebral infarction    Seizures [facility])    age 40 - after concussion   Past Surgical History:  Procedure Laterality Date   CATARACT EXTRACTION Right 07/18/14   CATARACT EXTRACTION W/PHACO Left 08/01/2014   Procedure: CATARACT EXTRACTION PHACO AND INTRAOCULAR LENS PLACEMENT ([place]);  Surgeon: Leandrew Koyanagi, MD;  Location: [place];  Service: Ophthalmology;  Laterality: Left;  IVA TOPICAL   COLONOSCOPY  2008   OTHER SURGICAL HISTORY  2002   ear surgery   STAPEDES SURGERY Right 2002   Patient Active Problem List   Diagnosis Date Noted   Dysphagia, pharyngeal phase 09/17/2021   Cervicalgia 08/05/2021   Spondylosis without myelopathy or radiculopathy, lumbosacral region 06/24/2021   Lumbosacral facet hypertrophy 06/03/2021   Lumbar central spinal stenosis, w/o neurogenic claudication (L4-5) 06/03/2021   Lumbar lateral recess stenosis (Bilateral: L2-3, L4-5) (Right: L3-4) 06/03/2021   Lumbosacral  foraminal stenosis (Bilateral: L2-3, L3-4) (Left: L5-S1) 06/03/2021   Lumbar nerve root impingement (Right: L3 at L2-3 & L3-4) 06/03/2021   Ligamentum flavum hypertrophy (L3-4, L4-5) 06/03/2021   Abnormal MRI, lumbar spine (04/23/2021) 04/24/2021   Long term prescription benzodiazepine use (alprazolam) (Xanax) 03/24/2021   Grade 1 Retrolisthesis of L2/L3 (5 mm) and L3/L4 (3 mm) 03/24/2021   Levoscoliosis of lumbar spine (L3-4 apex) 03/24/2021   DDD (degenerative disc disease), lumbosacral 03/24/2021   Lumbosacral facet arthropathy (Left: L3-4, L4-5, and L5-S1) 03/24/2021   Tricompartment osteoarthritis of knee (Left) 03/24/2021   Baker cyst (Left) 03/24/2021   Chronic low back pain (1ry area of Pain) (Bilateral) (R>L) w/o sciatica 03/24/2021   Lumbar facet syndrome (Bilateral) 03/24/2021   Chronic lower extremity pain (2ry area of Pain) (Bilateral) (L>R) 03/24/2021   Lumbosacral radiculitis/sensory radiculopathy at L2 (Bilateral) 03/24/2021   Lumbosacral radiculitis/sensory radiculopathy at L3 (Bilateral) 03/24/2021   Chronic pain syndrome 03/23/2021   Pharmacologic therapy 03/23/2021   Disorder of skeletal system 03/23/2021   Problems influencing health status 03/23/2021   PAD (peripheral artery disease) ([place]) 10/01/2020   GAD (generalized anxiety disorder) 01/13/2018   MDD (major depressive disorder) 47/65/4650   Umbilical hernia 35/46/5681   Chronic knee pain (Left) 08/11/2017   Derangement of medial meniscus, posterior horn (Left) 08/11/2017   Vitamin D insufficiency 03/05/2016   BPH without obstruction/lower urinary tract symptoms 07/08/2015  Chronic fatigue 10/23/2014   Parkinson's disease ([place]) 09/20/2014   Major depression, recurrent, full remission ([place]) 07/26/2013   Unsteady gait 07/26/2013   Orthostatic hypotension 07/26/2013   Depression 07/26/2013   Anxiety 06/23/2013   Chronic anxiety 06/23/2013   Tremor 05/18/2013   Bilateral carotid artery stenosis 08/30/2012     ONSET DATE: 05/28/2015; referral date 08/11/2021  REFERRING DIAG: G20 (ICD-10-CM) - Parkinson's disease  THERAPY DIAG:  Dysarthria and anarthria  Rationale for Evaluation and Treatment Rehabilitation  SUBJECTIVE:   SUBJECTIVE/ STATEMENT: "My speech today is worse than it was last session."  PERTINENT HISTORY: Moderate bilateral Parkinsonism with FOG, anxiety, Scoliosis, Severe and chronic back pain  PAIN:  Are you having pain? Yes: NPRS scale: 6/10 Pain location: back Pain description: lower; right; left Aggravating factors: sitting Relieving factors: under care of pain doctor; receiving epidurals  PATIENT GOALS pt's wife states her goal as "so other people can understand him"  OBJECTIVE:   TODAY'S TREATMENT:  Skilled treatment session focused on pt's dysarthria goals. SLP reviewed resonant voice exercises pt completing at home. Pt with strained/glottal focus with m+vowel productions ~70% of the time. Pt moved quickly through sirens of intensity and pitch changes without adequate breath support, resulting in strained vocal quality. SLP reduced task complexity with hum in isolation, for which pt required initial mod cues, fading to occasional min cues for oral resonant focus, ~80% acc. When progressing to vowel sounds with hum, pt productions were inconsistent. SLP worked with pt on awareness of oral resonant vs glottal focus, which pt dubbed, "gutteral." As pt has struggled with abdominal breathing, SLP cued for breath pacing/coordination, as well as slower rate to improve vocal quality/intelligibility, judged to be 80% for this less-familiar listener.    PATIENT EDUCATION: Education details: do not engage in conversation in the mornings until he is able to achieve increased speech intelligibility after drinking water, consuming his breakfast and his medications Person educated: Patient and Spouse Education method: Explanation, Demonstration, and Verbal cues Education  comprehension: verbalized understanding and needs further education       GOALS: Goals reviewed with patient? Yes  SHORT TERM GOALS: Target date: 10 therapy sessions  The patient produce "ah", High/Lows, and Functional Phrases at average loudness >/= 80 dB and with loud, good quality voice with moderate cues.             Baseline: maximal cues Goal status: MET  2.  The patient will complete reading drills (words/phrases, sentences) at average >/= 75 dB and with loud, good quality voice with moderate cues.  Baseline: maximal cues Goal status: MET  3.   The patient will participate in 5-8 minutes conversation, maintaining average loudness of 75 dB and loud, good quality voice with min cues.  Baseline: maximal cues Goal status: Ongoing  4.  The patient will complete an instrumental evaluation (MBSS) within 3 weeks in order to evaluate swallowing safety. Baseline: last MBSS 2017 Goal status: Met   LONG TERM GOALS: Target date: 11/12/2021    The patient will participate in 15-20 minutes conversation, maintaining average loudness of 75 dB and loud, good quality voice. Baseline: 60dB Goal status: Ongoing  2.   Patient will report improved communication effectiveness as measured by Communicative Effectiveness Survey.   Baseline: 11/32 Goal status: Ongoing  3.  Pt will consume least restrictive diet with no s/s of respiratory decline.  Baseline: new goal Goal status: Met   ASSESSMENT:  CLINICAL IMPRESSION: Pt remains receptive and motivated to learn strategies  to improve his voice quality and intelligibility. Pt is limited by sub-optimal breath support, posture, and attention to speech intelligibility strategies. Pt responded best to imitation/demonstration cues and singular focus vs dual tasking or more cognitively demanding tasks. Pt reported worsened back pain today which is felt to have further impacted pt's vocal quality due to diminished breath support/reduced ability to  inhale deeply.  OBJECTIVE IMPAIRMENTS  Objective impairments include voice disorder and dysphagia. These impairments are limiting patient from effectively communicating at home and in community and safety when swallowing.Factors affecting potential to achieve goals and functional outcome are co-morbidities, medical prognosis, pain level, and severity of impairments.Patient will benefit from skilled SLP services to address above impairments and improve overall function.  REHAB POTENTIAL: Fair multiple previous ST sessions; progressive severe chronic back pain  PLAN: SLP FREQUENCY: 2x/week  SLP DURATION: 12 weeks  PLANNED INTERVENTIONS: Aspiration precaution training, Pharyngeal strengthening exercises, Diet toleration management , Trials of upgraded texture/liquids, SLP instruction and feedback, and Compensatory strategies   Deneise Lever, [place], Actor [phone]

## 2021-10-01 ENCOUNTER — Ambulatory Visit: Payer: Medicare HMO

## 2021-10-01 NOTE — Therapy (Signed)
OUTPATIENT PHYSICAL THERAPY NEURO TREATMENT   Patient Name: Joseph Arroyo MRN: [id number] DOB:10/26/1940, 81 y.o., male Today's Date: 10/02/2021   PCP: Olin Hauser, DO REFERRING PROVIDER: Lavena Bullion, NP   PT End of Session - 10/02/21 0959     Visit Number 5    Number of Visits 25    Date for PT Re-Evaluation 11/13/21    Authorization Type Aetna Medicare    Authorization Time Period Initial Cert 09/24/2033- 5/97/4163    Progress Note Due on Visit 10    PT Start Time 1015    PT Stop Time 1059    PT Time Calculation (min) 44 min    Equipment Utilized During Treatment Gait belt    Activity Tolerance Patient tolerated treatment well    Behavior During Therapy [facility] for tasks assessed/performed               Past Medical History:  Diagnosis Date   Allergic rhinitis due to pollen 11/21/2007   Brachial neuritis or radiculitis NOS    Cervicalgia    Concussion    age 27 - s/p accident   Costal chondritis    Depression    Essential and other specified forms of tremor    GERD (gastroesophageal reflux disease)    in past   Lumbago    Occlusion and stenosis of carotid artery without mention of cerebral infarction    Seizures [facility])    age 51 - after concussion   Past Surgical History:  Procedure Laterality Date   CATARACT EXTRACTION Right 07/18/14   CATARACT EXTRACTION W/PHACO Left 08/01/2014   Procedure: CATARACT EXTRACTION PHACO AND INTRAOCULAR LENS PLACEMENT ([place]);  Surgeon: Leandrew Koyanagi, MD;  Location: [place];  Service: Ophthalmology;  Laterality: Left;  IVA TOPICAL   COLONOSCOPY  2008   OTHER SURGICAL HISTORY  2002   ear surgery   STAPEDES SURGERY Right 2002   Patient Active Problem List   Diagnosis Date Noted   Dysphagia, pharyngeal phase 09/17/2021   Cervicalgia 08/05/2021   Spondylosis without myelopathy or radiculopathy, lumbosacral region 06/24/2021   Lumbosacral facet hypertrophy 06/03/2021   Lumbar central spinal  stenosis, w/o neurogenic claudication (L4-5) 06/03/2021   Lumbar lateral recess stenosis (Bilateral: L2-3, L4-5) (Right: L3-4) 06/03/2021   Lumbosacral foraminal stenosis (Bilateral: L2-3, L3-4) (Left: L5-S1) 06/03/2021   Lumbar nerve root impingement (Right: L3 at L2-3 & L3-4) 06/03/2021   Ligamentum flavum hypertrophy (L3-4, L4-5) 06/03/2021   Abnormal MRI, lumbar spine (04/23/2021) 04/24/2021   Long term prescription benzodiazepine use (alprazolam) (Xanax) 03/24/2021   Grade 1 Retrolisthesis of L2/L3 (5 mm) and L3/L4 (3 mm) 03/24/2021   Levoscoliosis of lumbar spine (L3-4 apex) 03/24/2021   DDD (degenerative disc disease), lumbosacral 03/24/2021   Lumbosacral facet arthropathy (Left: L3-4, L4-5, and L5-S1) 03/24/2021   Tricompartment osteoarthritis of knee (Left) 03/24/2021   Baker cyst (Left) 03/24/2021   Chronic low back pain (1ry area of Pain) (Bilateral) (R>L) w/o sciatica 03/24/2021   Lumbar facet syndrome (Bilateral) 03/24/2021   Chronic lower extremity pain (2ry area of Pain) (Bilateral) (L>R) 03/24/2021   Lumbosacral radiculitis/sensory radiculopathy at L2 (Bilateral) 03/24/2021   Lumbosacral radiculitis/sensory radiculopathy at L3 (Bilateral) 03/24/2021   Chronic pain syndrome 03/23/2021   Pharmacologic therapy 03/23/2021   Disorder of skeletal system 03/23/2021   Problems influencing health status 03/23/2021   PAD (peripheral artery disease) ([place]) 10/01/2020   GAD (generalized anxiety disorder) 01/13/2018   MDD (major depressive disorder) 84/53/6468   Umbilical hernia 05/21/2246  Chronic knee pain (Left) 08/11/2017   Derangement of medial meniscus, posterior horn (Left) 08/11/2017   Vitamin D insufficiency 03/05/2016   BPH without obstruction/lower urinary tract symptoms 07/08/2015   Chronic fatigue 10/23/2014   Parkinson's disease ([place]) 09/20/2014   Major depression, recurrent, full remission ([place]) 07/26/2013   Unsteady gait 07/26/2013   Orthostatic hypotension  07/26/2013   Depression 07/26/2013   Anxiety 06/23/2013   Chronic anxiety 06/23/2013   Tremor 05/18/2013   Bilateral carotid artery stenosis 08/30/2012    ONSET DATE: 08/11/2021  REFERRING DIAG: Parkinsons Disease  THERAPY DIAG:  Muscle weakness (generalized)  Atypical parkinsonism (HCC)  Difficulty in walking, not elsewhere classified  Abnormality of gait and mobility  Unsteadiness on feet  Rationale for Evaluation and Treatment Rehabilitation  SUBJECTIVE:                                                                                                                                                                                              SUBJECTIVE STATEMENT: Patient reports his back is bothering him today. Is with his daughter and wife today.   Pt accompanied by: significant other  PERTINENT HISTORY: Per chart history provided by Dr. Consuela Mimes- Mr. Angelino has Parkinson's disease ([place]); Chronic knee pain (Left); Derangement of medial meniscus, posterior horn (Left); PAD (peripheral artery disease) ([place]); Chronic pain syndrome; Grade 1 Retrolisthesis of L2/L3 (5 mm) and L3/L4 (3 mm); Levoscoliosis of lumbar spine (L3-4 apex); DDD (degenerative disc disease), lumbosacral; Lumbosacral facet arthropathy (Left: L3-4, L4-5, and L5-S1); Tricompartment osteoarthritis of knee (Left); Baker cyst (Left); Chronic low back pain (1ry area of Pain) (Bilateral) (R>L) w/o sciatica; Lumbar facet syndrome (Bilateral); Chronic lower extremity pain (2ry area of Pain) (Bilateral) (L>R); Lumbosacral radiculitis/sensory radiculopathy at L2 (Bilateral); Lumbosacral radiculitis/sensory radiculopathy at L3 (Bilateral); Abnormal MRI, lumbar spine (04/23/2021); Lumbosacral facet hypertrophy; Lumbar central spinal stenosis, w/o neurogenic claudication (L4-5); Lumbar lateral recess stenosis (Bilateral: L2-3, L4-5) (Right: L3-4); Lumbosacral foraminal stenosis (Bilateral: L2-3, L3-4) (Left: L5-S1); Lumbar  nerve root impingement (Right: L3 at L2-3 & L3-4); Ligamentum flavum hypertrophy (L3-4, L4-5); Spondylosis without myelopathy or radiculopathy, lumbosacral region; and Cervicalgia on their pertinent problem list   Patient has appointment for Epidural to low back next Tues 08/26/2021  PAIN:  Are you having pain? Yes: NPRS scale: 8/10 Pain location: low back Pain description: ache/soreness Aggravating factors: prolonged standing, transfers, walking Relieving factors: rest and medications  PRECAUTIONS: Fall  WEIGHT BEARING RESTRICTIONS No  FALLS: Has patient fallen in last 6 months? Yes. Number of falls 2  LIVING ENVIRONMENT: Lives with: lives with their spouse Lives in: House/apartment Stairs: 4 Has following  equipment at home: Gilford Rile - 2 wheeled  PLOF: Needs assistance with ADLs- intermittent  PATIENT GOALS  to walk better and be as active as possible. Also to get in/out of cars better.   OBJECTIVE: (objective measures completed at initial evaluation unless otherwise dated)   DIAGNOSTIC FINDINGS: IMPRESSION: 1. Severe lumbar levoscoliosis with widespread advanced disc and facet degeneration. 2. Mild spinal stenosis at L4-5. 3. Moderate neural foraminal stenosis on the right at L3-4 and on the left at L5-S1. 4. Moderate right lateral recess stenosis at L2-3.      LOWER EXTREMITY MMT:    MMT (evaluation)  Right Eval Left Eval  Hip flexion 4 4  Hip extension 4 4  Hip abduction 4 4  Hip adduction 4 4  Hip internal rotation 4 4  Hip external rotation 4 4  Knee flexion 4 4  Knee extension 4 4  Ankle dorsiflexion 4 4  Ankle plantarflexion 4 4  Ankle inversion 4 4  Ankle eversion 4 4  (Blank rows = not tested)     FUNCTIONAL TESTS (EVALUATION):  5 times sit to stand: 14 sec without UE support Timed up and go (TUG): 15.67 sec with RW 10 meter walk test: 10.67 sec = 0.94 m/s using RW Berg Balance Scale: 48/56- see flowsheet for details  PATIENT SURVEYS:  FOTO  : 49  TODAY'S TREATMENT 10/02/21  BP: 96/56 TherEx  Ball roll outs x 8 each direction ( 3 way)  Step over hedgehog laterally seated 10x each LE; slight pain increase in L hip    NMR:   Standing with CGA next to support surface:  Airex pad: static stand 30 seconds x 2 trials, noticeable trembling of ankles/LE's with fatigue and challenge to maintain stability Airex pad: horizontal head turns 30 seconds scanning room 10x ; cueing for arc of motion  Airex pad: vertical head turns 30 seconds, cueing for arc of motion, noticeable sway with upward gaze increasing demand on ankle righting reaction musculature Airex pad: one foot on 6" step one foot on airex pad, hold position for 30 seconds, switch legs, 2x each LE; Airex pad RTB row 20x, straight arm lat pulldown 10x   Step over line on ground, clap and return to position 10x each side   Seated on dynadisc:  -static position with stabilization to front of patient 60 seconds -alternating UE raises 8x each UE -alternating march 10x each LE      PATIENT EDUCATION: Education details: need to monitor BP closely  Person educated: Patient Education method: Explanation, Demonstration, Tactile cues, and Verbal cues Education comprehension: verbalized understanding, returned demonstration, verbal cues required, tactile cues required, and needs further education   HOME EXERCISE PROGRAM: PWR! Basic 4 seated x 10 ea, handout provided: addended to not perform seated PRW! Rock exercise and added the below to program on 09/25/21  Access Code: [email or web address] URL: [email or web address] Date: 09/25/2021 Prepared by: Rivka Barbara  Exercises - Standing Romberg to 3/4 Tandem Stance  - 1 x daily - 7 x weekly - 2 sets - 30 seocnd hold - Standing March with Counter Support  - 1 x daily - 7 x weekly - 2 sets - 10 reps - Modified Single Leg Balance on Step  - 1 x daily - 7 x weekly - 2 sets - 30 second hold    GOALS: Goals reviewed with  patient? Yes  SHORT TERM GOALS: Target date: 10/02/2021  Pt will be independent with initial HEP in order to improve strength and  balance in order to decrease fall risk and improve function at home and work.  Baseline: 08/21/2021-No formal HEP in place Goal status: INITIAL    LONG TERM GOALS: Target date: 9/14//2023  Pt will improve BERG by at least 3 points in order to demonstrate clinically significant improvement in balance.    Baseline: 08/21/2021= 48/56 Goal status: INITIAL  2.  Pt will be independent with final HEP in order to improve strength and balance in order to decrease fall risk and improve function at home and work.  Baseline: No HEP in place Goal status: INITIAL  3.  Pt will decrease 5TSTS by at least 4 seconds in order to demonstrate clinically significant improvement in LE strength. Baseline: 08/21/2021= 19.01 sec without UE support Goal status: INITIAL  4.  Pt will decrease TUG to below 14 seconds/decrease in order to demonstrate decreased fall risk. Baseline:  08/21/2021=15.67 with RW Goal status: INITIAL  5.  Pt will improve FOTO to target score of 56 to display perceived improvements in ability to complete ADL's.  Baseline: 08/21/2021=49 Goal status: INITIAL  6.  Pt will increase 10MWT by at least 0.15 m/s in order to demonstrate clinically significant improvement in community ambulation.   Baseline: 08/22/2021=0.94 m/s Goal status: INITIAL  ASSESSMENT:  CLINICAL IMPRESSION: Patient presents with excellent motivation. He does require use of a head pad between standing interventions to decrease pain/discomfort in his back. Stabilization interventions tolerated well with challenge to vertical head turn intervention. Patient aware of postural limitations and requires cueing for decreased velocity fo rimproved muscle recruitment. Pt continues to demonstrate progress toward goals AEB progression of some interventions this date either in volume or  intensity.    OBJECTIVE IMPAIRMENTS Abnormal gait, decreased activity tolerance, decreased balance, decreased coordination, decreased endurance, decreased knowledge of use of DME, decreased mobility, difficulty walking, decreased ROM, decreased strength, hypomobility, and impaired flexibility.   ACTIVITY LIMITATIONS carrying, lifting, bending, standing, squatting, sleeping, stairs, transfers, continence, bathing, toileting, and dressing  PARTICIPATION LIMITATIONS: cleaning, laundry, medication management, driving, shopping, community activity, and yard work  PERSONAL FACTORS Age are also affecting patient's functional outcome.   REHAB POTENTIAL: Good  CLINICAL DECISION MAKING: Stable/uncomplicated  EVALUATION COMPLEXITY: Low  PLAN: PT FREQUENCY: 2x/week  PT DURATION: 12 weeks  PLANNED INTERVENTIONS: Therapeutic exercises, Therapeutic activity, Neuromuscular re-education, Balance training, Gait training, Patient/Family education, Joint mobilization, Stair training, Vestibular training, Canalith repositioning, DME instructions, Dry Needling, Electrical stimulation, Spinal manipulation, and Spinal mobilization  PLAN FOR NEXT SESSION: continue balance training, monitor hypotension and orthostatics, ROM for flexibility,   Janna Arch, PT 10/02/2021, 11:34 AM  11:34 AM, 10/02/21

## 2021-10-01 NOTE — Therapy (Signed)
OUTPATIENT OCCUPATIONAL THERAPY NEURO TREATMENT  Patient Name: Joseph Arroyo MRN: [id number] DOB:1940-05-15, 81 y.o., male Today's Date: 10/01/2021  PCP: Dr. Nobie Putnam REFERRING PROVIDER: Dr. Lavena Bullion   OT End of Session - 10/01/21 0950     Visit Number 5    Number of Visits 24    Date for OT Re-Evaluation 11/11/21    Authorization Time Period Reporting period beginning 08/19/21    OT Start Time 69    OT Stop Time 1345    OT Time Calculation (min) 45 min    Equipment Utilized During Treatment RW    Activity Tolerance Patient tolerated treatment well    Behavior During Therapy [facility] for tasks assessed/performed             Past Medical History:  Diagnosis Date   Allergic rhinitis due to pollen 11/21/2007   Brachial neuritis or radiculitis NOS    Cervicalgia    Concussion    age 73 - s/p accident   Costal chondritis    Depression    Essential and other specified forms of tremor    GERD (gastroesophageal reflux disease)    in past   Lumbago    Occlusion and stenosis of carotid artery without mention of cerebral infarction    Seizures [facility])    age 77 - after concussion   Past Surgical History:  Procedure Laterality Date   CATARACT EXTRACTION Right 07/18/14   CATARACT EXTRACTION W/PHACO Left 08/01/2014   Procedure: CATARACT EXTRACTION PHACO AND INTRAOCULAR LENS PLACEMENT ([place]);  Surgeon: Leandrew Koyanagi, MD;  Location: [place];  Service: Ophthalmology;  Laterality: Left;  IVA TOPICAL   COLONOSCOPY  2008   OTHER SURGICAL HISTORY  2002   ear surgery   STAPEDES SURGERY Right 2002   Patient Active Problem List   Diagnosis Date Noted   Dysphagia, pharyngeal phase 09/17/2021   Cervicalgia 08/05/2021   Spondylosis without myelopathy or radiculopathy, lumbosacral region 06/24/2021   Lumbosacral facet hypertrophy 06/03/2021   Lumbar central spinal stenosis, w/o neurogenic claudication (L4-5) 06/03/2021   Lumbar lateral recess stenosis  (Bilateral: L2-3, L4-5) (Right: L3-4) 06/03/2021   Lumbosacral foraminal stenosis (Bilateral: L2-3, L3-4) (Left: L5-S1) 06/03/2021   Lumbar nerve root impingement (Right: L3 at L2-3 & L3-4) 06/03/2021   Ligamentum flavum hypertrophy (L3-4, L4-5) 06/03/2021   Abnormal MRI, lumbar spine (04/23/2021) 04/24/2021   Long term prescription benzodiazepine use (alprazolam) (Xanax) 03/24/2021   Grade 1 Retrolisthesis of L2/L3 (5 mm) and L3/L4 (3 mm) 03/24/2021   Levoscoliosis of lumbar spine (L3-4 apex) 03/24/2021   DDD (degenerative disc disease), lumbosacral 03/24/2021   Lumbosacral facet arthropathy (Left: L3-4, L4-5, and L5-S1) 03/24/2021   Tricompartment osteoarthritis of knee (Left) 03/24/2021   Baker cyst (Left) 03/24/2021   Chronic low back pain (1ry area of Pain) (Bilateral) (R>L) w/o sciatica 03/24/2021   Lumbar facet syndrome (Bilateral) 03/24/2021   Chronic lower extremity pain (2ry area of Pain) (Bilateral) (L>R) 03/24/2021   Lumbosacral radiculitis/sensory radiculopathy at L2 (Bilateral) 03/24/2021   Lumbosacral radiculitis/sensory radiculopathy at L3 (Bilateral) 03/24/2021   Chronic pain syndrome 03/23/2021   Pharmacologic therapy 03/23/2021   Disorder of skeletal system 03/23/2021   Problems influencing health status 03/23/2021   PAD (peripheral artery disease) ([place]) 10/01/2020   GAD (generalized anxiety disorder) 01/13/2018   MDD (major depressive disorder) 19/37/9024   Umbilical hernia 09/73/5329   Chronic knee pain (Left) 08/11/2017   Derangement of medial meniscus, posterior horn (Left) 08/11/2017   Vitamin D insufficiency 03/05/2016  BPH without obstruction/lower urinary tract symptoms 07/08/2015   Chronic fatigue 10/23/2014   Parkinson's disease ([place]) 09/20/2014   Major depression, recurrent, full remission ([place]) 07/26/2013   Unsteady gait 07/26/2013   Orthostatic hypotension 07/26/2013   Depression 07/26/2013   Anxiety 06/23/2013   Chronic anxiety 06/23/2013    Tremor 05/18/2013   Bilateral carotid artery stenosis 08/30/2012    ONSET DATE: 08/11/21 date of referral.  PD symptoms since 2015.  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   PERTINENT HISTORY: Per chart, pt with hx of Parkinson's disease ([place]); Chronic knee pain (Left); Derangement of medial meniscus, posterior horn (Left); PAD (peripheral artery disease) ([place]); Chronic pain syndrome; DDD (degenerative disc disease) lumbosacral; osteoarthritis of knee (Left); Chronic low back pain, Lumbar facet syndrome (Bilateral); Chronic lower extremity pain. PRECAUTIONS: Fall  WEIGHT BEARING RESTRICTIONS No  PAIN:  Are you having pain? Yes: NPRS scale: 6/10 Pain location: low back Pain description: achy Aggravating factors: N/A, constant Relieving factors: rest, repositioning  PATIENT GOALS : Increase efficiency with daily tasks, increase FMC skills   OBJECTIVE:   TODAY'S TREATMENT:  Therapeutic Exercise: Facilitated BUE strengthening and increasing activity tolerance with pt participation in UBE.  Pt performed 8 min total with moderate resistance, pedaling forward for 3 min, 2 min in reverse.  Pt took 1 seated rest break for 2 min, then stood for another 3 min to pedal forward.  OT provided constant monitoring to adjust resistance as needed and provided tactile and vc for postural adjustment.  Performed theraband exercises in supine this date as pt reported soreness in his back when completing his band exercises at home.  Also decreased resistance slightly from red band to yellow band d/t reports of soreness this date.  Pt tolerated horiz abd, shoulder elevation, abd, and elbow flex/ext in supine, with mod vc for technique, form, and pace.  Pt completed 1 set  of 10 reps, then 1 set of 5 reps for review.   PATIENT EDUCATION: Education details: HEP modifications  Person educated: Patient and Spouse Education method: Explanation and Verbal cues Education comprehension: verbalized understanding   HOME EXERCISE PROGRAM:  BUE strengthening with yellow/red theraband   GOALS: Goals reviewed with patient? Yes  SHORT TERM GOALS: Target date: 09/30/21  Pt will be indep to perform HEP for increasing strength and coordination in B hands.  Baseline: HEP not yet initiated Goal status: INITIAL  2.  Pt will be indep to verbalize 3 fall prevention strategies to implement during ADLs.  Baseline: Not yet initiated Goal status: INITIAL    3 LONG TERM GOALS: 11/11/21  Pt will increase FOTO score by 3 or more points to indicate increased perceived functional performance with daily tasks.  Baseline: FOTO 60 Goal status: INITIAL  2.  Pt will be able to efficiently send short text messages to family/friends with modified techniques as needed (ie: use of stylus, voice to text) Baseline: Spouse sends texts for pt Goal status: INITIAL  3.  Pt will fill out a check for bill paying with 90% legibility with adapted pen as needed. Baseline: Pt reports inconsistent legibility Goal status: INITIAL  4.  Pt will increase efficiency with dressing tasks, donning clothes in 15 min or less. Baseline: Spouse reports it can take pt 20-30 min to dress in the morning Goal  status: INITIAL    ASSESSMENT:  CLINICAL IMPRESSION:   Pt. presented with 6/10 back pain, and requesting moist heat pack while sitting during review of OT goals, which pt. tolerated well. Used moist heat in supine as well while performing supine theraband exercises.  Pt. required Verbal cues, tactile cues, and cues for visual demonstration of each exercise for form, and technique.   Issued yellow theraband today and performed exercises in supine to help with soreness.  Pt responded well to supine  exercises.  Pt. continues to work on improving  BUE strength and activity tolerance, and [place] skills in order to work towards improving engagement with, and maximizing independence with ADLs, and IADL tasks.     PERFORMANCE DEFICITS in functional skills including ADLs, IADLs, coordination, dexterity, ROM, strength, pain, flexibility, FMC, and GMC, cognitive skills including safety awareness.  IMPAIRMENTS are limiting patient from ADLs, IADLs, and leisure.   COMORBIDITIES has co-morbidities such as scoliosis, chronic back pain and leg pain  that affects occupational performance. Patient will benefit from skilled OT to address above impairments and improve overall function.  MODIFICATION OR ASSISTANCE TO COMPLETE EVALUATION: No modification of tasks or assist necessary to complete an evaluation.  OT OCCUPATIONAL PROFILE AND HISTORY: Problem focused assessment: Including review of records relating to presenting problem.  CLINICAL DECISION MAKING: Moderate - several treatment options, min-mod task modification necessary  REHAB POTENTIAL: Good  EVALUATION COMPLEXITY: Moderate    PLAN: OT FREQUENCY: 2x/week  OT DURATION: 12 weeks  PLANNED INTERVENTIONS: self care/ADL training, therapeutic exercise, therapeutic activity, neuromuscular re-education, manual therapy, passive range of motion, balance training, functional mobility training, moist heat, cryotherapy, patient/family education, cognitive remediation/compensation, energy conservation, coping strategies training, and DME and/or AE instructions  RECOMMENDED OTHER SERVICES: N/A  CONSULTED AND AGREED WITH PLAN OF CARE: Patient and family member/caregiver  PLAN FOR NEXT SESSION: See above   Leta Speller, MS, OTR/L  Darleene Cleaver, OT 10/01/2021, 9:51 AM

## 2021-10-02 ENCOUNTER — Ambulatory Visit: Payer: Medicare HMO

## 2021-10-02 DIAGNOSIS — R2681 Unsteadiness on feet: Secondary | ICD-10-CM

## 2021-10-02 DIAGNOSIS — R262 Difficulty in walking, not elsewhere classified: Secondary | ICD-10-CM | POA: Diagnosis not present

## 2021-10-02 DIAGNOSIS — R278 Other lack of coordination: Secondary | ICD-10-CM

## 2021-10-02 DIAGNOSIS — R471 Dysarthria and anarthria: Secondary | ICD-10-CM | POA: Diagnosis not present

## 2021-10-02 DIAGNOSIS — M6281 Muscle weakness (generalized): Secondary | ICD-10-CM

## 2021-10-02 DIAGNOSIS — G20 Parkinson's disease: Secondary | ICD-10-CM

## 2021-10-02 DIAGNOSIS — R269 Unspecified abnormalities of gait and mobility: Secondary | ICD-10-CM | POA: Diagnosis not present

## 2021-10-02 DIAGNOSIS — R2689 Other abnormalities of gait and mobility: Secondary | ICD-10-CM | POA: Diagnosis not present

## 2021-10-02 DIAGNOSIS — R490 Dysphonia: Secondary | ICD-10-CM | POA: Diagnosis not present

## 2021-10-03 NOTE — Therapy (Signed)
OUTPATIENT OCCUPATIONAL THERAPY NEURO TREATMENT  Patient Name: Joseph Arroyo MRN: [id number] DOB:18-Oct-1940, 81 y.o., male Today's Date: 10/03/2021  PCP: Dr. Nobie Putnam REFERRING PROVIDER: Dr. Lavena Bullion   OT End of Session - 10/03/21 0817     Visit Number 6    Number of Visits 24    Date for OT Re-Evaluation 11/11/21    Authorization Time Period Reporting period beginning 08/19/21    OT Start Time 12    OT Stop Time 1145    OT Time Calculation (min) 45 min    Equipment Utilized During Treatment RW    Activity Tolerance Patient tolerated treatment well    Behavior During Therapy [facility] for tasks assessed/performed             Past Medical History:  Diagnosis Date   Allergic rhinitis due to pollen 11/21/2007   Brachial neuritis or radiculitis NOS    Cervicalgia    Concussion    age 89 - s/p accident   Costal chondritis    Depression    Essential and other specified forms of tremor    GERD (gastroesophageal reflux disease)    in past   Lumbago    Occlusion and stenosis of carotid artery without mention of cerebral infarction    Seizures [facility])    age 15 - after concussion   Past Surgical History:  Procedure Laterality Date   CATARACT EXTRACTION Right 07/18/14   CATARACT EXTRACTION W/PHACO Left 08/01/2014   Procedure: CATARACT EXTRACTION PHACO AND INTRAOCULAR LENS PLACEMENT ([place]);  Surgeon: Leandrew Koyanagi, MD;  Location: [place];  Service: Ophthalmology;  Laterality: Left;  IVA TOPICAL   COLONOSCOPY  2008   OTHER SURGICAL HISTORY  2002   ear surgery   STAPEDES SURGERY Right 2002   Patient Active Problem List   Diagnosis Date Noted   Dysphagia, pharyngeal phase 09/17/2021   Cervicalgia 08/05/2021   Spondylosis without myelopathy or radiculopathy, lumbosacral region 06/24/2021   Lumbosacral facet hypertrophy 06/03/2021   Lumbar central spinal stenosis, w/o neurogenic claudication (L4-5) 06/03/2021   Lumbar lateral recess stenosis  (Bilateral: L2-3, L4-5) (Right: L3-4) 06/03/2021   Lumbosacral foraminal stenosis (Bilateral: L2-3, L3-4) (Left: L5-S1) 06/03/2021   Lumbar nerve root impingement (Right: L3 at L2-3 & L3-4) 06/03/2021   Ligamentum flavum hypertrophy (L3-4, L4-5) 06/03/2021   Abnormal MRI, lumbar spine (04/23/2021) 04/24/2021   Long term prescription benzodiazepine use (alprazolam) (Xanax) 03/24/2021   Grade 1 Retrolisthesis of L2/L3 (5 mm) and L3/L4 (3 mm) 03/24/2021   Levoscoliosis of lumbar spine (L3-4 apex) 03/24/2021   DDD (degenerative disc disease), lumbosacral 03/24/2021   Lumbosacral facet arthropathy (Left: L3-4, L4-5, and L5-S1) 03/24/2021   Tricompartment osteoarthritis of knee (Left) 03/24/2021   Baker cyst (Left) 03/24/2021   Chronic low back pain (1ry area of Pain) (Bilateral) (R>L) w/o sciatica 03/24/2021   Lumbar facet syndrome (Bilateral) 03/24/2021   Chronic lower extremity pain (2ry area of Pain) (Bilateral) (L>R) 03/24/2021   Lumbosacral radiculitis/sensory radiculopathy at L2 (Bilateral) 03/24/2021   Lumbosacral radiculitis/sensory radiculopathy at L3 (Bilateral) 03/24/2021   Chronic pain syndrome 03/23/2021   Pharmacologic therapy 03/23/2021   Disorder of skeletal system 03/23/2021   Problems influencing health status 03/23/2021   PAD (peripheral artery disease) ([place]) 10/01/2020   GAD (generalized anxiety disorder) 01/13/2018   MDD (major depressive disorder) 44/96/7591   Umbilical hernia 63/84/6659   Chronic knee pain (Left) 08/11/2017   Derangement of medial meniscus, posterior horn (Left) 08/11/2017   Vitamin D insufficiency 03/05/2016  BPH without obstruction/lower urinary tract symptoms 07/08/2015   Chronic fatigue 10/23/2014   Parkinson's disease ([place]) 09/20/2014   Major depression, recurrent, full remission ([place]) 07/26/2013   Unsteady gait 07/26/2013   Orthostatic hypotension 07/26/2013   Depression 07/26/2013   Anxiety 06/23/2013   Chronic anxiety 06/23/2013    Tremor 05/18/2013   Bilateral carotid artery stenosis 08/30/2012    ONSET DATE: 08/11/21 date of referral.  PD symptoms since 2015.  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  PERTINENT HISTORY: Per chart, pt with hx of Parkinson's disease ([place]); Chronic knee pain (Left); Derangement of medial meniscus, posterior horn (Left); PAD (peripheral artery disease) ([place]); Chronic pain syndrome; DDD (degenerative disc disease) lumbosacral; osteoarthritis of knee (Left); Chronic low back pain, Lumbar facet syndrome (Bilateral); Chronic lower extremity pain. PRECAUTIONS: Fall  WEIGHT BEARING RESTRICTIONS No  PAIN:  Are you having pain? Yes: NPRS scale: 6/10 Pain location: low back Pain description: achy Aggravating factors: N/A, constant Relieving factors: rest, repositioning  PATIENT GOALS : Increase efficiency with daily tasks, increase FMC skills   OBJECTIVE:   TODAY'S TREATMENT:  Therapeutic Exercise: Facilitated BUE strengthening and increasing activity tolerance for ADLs with pt participation in [place].  Pt performed 8 min total with moderate resistance, pedaling forward for 3 min, 2 min in reverse.  Pt took 1 seated rest break for 2 min, then completed UBE for another 3 min of forward pedaling in seated position.    Self Care: Practiced handwriting legibility writing practice checks using weighted pen with built up grip.  Provided education on optimal positioning, ensuring BUE support with arms resting on table top.  Discouraged use of lap tray at home when writing checks and encouraged positioning at kitchen table where pt can have UEs supported on table.  Pt tried a 1#  wrist weight during writing practice, but did not feel much benefit.  OT encouraged pt practice printing with capital letters vs cursive writing to discourage micrographia and increase legibility.  Pt plans to practice these strategies at home to increase legibility for check writing.  PATIENT EDUCATION: Education details: positioning/adaptations to increase [employer] architect Person educated: Patient and Spouse Education method: Explanation and Verbal cues Education comprehension: verbalized understanding   HOME EXERCISE PROGRAM:  BUE strengthening with yellow/red theraband   GOALS: Goals reviewed with patient? Yes  SHORT TERM GOALS: Target date: 09/30/21  Pt will be indep to perform HEP for increasing strength and coordination in B hands.  Baseline: HEP not yet initiated Goal status: INITIAL  2.  Pt will be indep to verbalize 3 fall prevention strategies to implement during ADLs.  Baseline: Not yet initiated Goal status: INITIAL    3 LONG TERM GOALS: 11/11/21  Pt will increase FOTO score by 3 or more points to indicate increased perceived functional performance with daily tasks.  Baseline: FOTO 60 Goal status: INITIAL  2.  Pt will be able to efficiently send short text messages to family/friends with modified techniques as needed (ie: use of stylus, voice to text) Baseline: Spouse sends texts for pt Goal status: INITIAL  3.  Pt will fill out a check for bill paying with 90% legibility with adapted pen as needed. Baseline: Pt reports inconsistent legibility Goal status: INITIAL  4.  Pt will increase efficiency with dressing tasks, donning clothes in 15 min or less. Baseline: Spouse reports it can take pt 20-30 min to dress in the morning Goal status: INITIAL    ASSESSMENT:  CLINICAL IMPRESSION:   Pt tolerated 8 min on UBE for 5 min standing, 3 min sitting, with 1 rest break between sitting and standing, requiring intermittent min vc for postural adjustments.  Pt  responded well and was receptive to education on optimal positioning for increasing handwriting legibility, ensuring BUE support with arms resting on table top.  Discouraged use of lap tray at home when writing checks and encouraged positioning at kitchen table where pt can have UEs supported on table.  Pt tried a 1# wrist weight during writing practice, but did not feel much benefit.  OT encouraged pt practice printing with capital letters vs cursive writing to discourage micrographia and increase legibility, as pt demonstrated more consistent legibility with printing in [place] letters this date.  Pt plans to practice these strategies at home to increase legibility for check writing.  Pt. continues to work on improving  BUE strength and activity tolerance, and [place] skills in order to work towards improving engagement with, and maximizing independence with ADLs, and IADL tasks.     PERFORMANCE DEFICITS in functional skills including ADLs, IADLs, coordination, dexterity, ROM, strength, pain, flexibility, FMC, and GMC, cognitive skills including safety awareness.  IMPAIRMENTS are limiting patient from ADLs, IADLs, and leisure.   COMORBIDITIES has co-morbidities such as scoliosis, chronic back pain and leg pain  that affects occupational performance. Patient will benefit from skilled OT to address above impairments and improve overall function.  MODIFICATION OR ASSISTANCE TO COMPLETE EVALUATION: No modification of tasks or assist necessary to complete an evaluation.  OT OCCUPATIONAL PROFILE AND HISTORY: Problem focused assessment: Including review of records relating to presenting problem.  CLINICAL DECISION MAKING: Moderate - several treatment options, min-mod task modification necessary  REHAB POTENTIAL: Good  EVALUATION COMPLEXITY: Moderate    PLAN: OT FREQUENCY: 2x/week  OT DURATION: 12 weeks  PLANNED INTERVENTIONS: self care/ADL training, therapeutic exercise, therapeutic activity,  neuromuscular re-education, manual therapy, passive range of motion, balance training, functional mobility training, moist heat, cryotherapy, patient/family education, cognitive remediation/compensation, energy conservation, coping strategies training, and DME and/or AE instructions  RECOMMENDED OTHER SERVICES: N/A  CONSULTED AND AGREED WITH PLAN OF CARE: Patient and family member/caregiver  PLAN FOR NEXT SESSION: See above   Leta Speller, MS, OTR/L  Darleene Cleaver, OT 10/03/2021, 8:20 AM

## 2021-10-07 ENCOUNTER — Ambulatory Visit: Payer: Medicare HMO | Admitting: Speech Pathology

## 2021-10-07 ENCOUNTER — Encounter: Payer: Medicare HMO | Admitting: Speech Pathology

## 2021-10-07 ENCOUNTER — Ambulatory Visit: Payer: Medicare HMO

## 2021-10-07 ENCOUNTER — Other Ambulatory Visit: Payer: Self-pay | Admitting: Family Medicine

## 2021-10-07 DIAGNOSIS — G20A1 Parkinson's disease without dyskinesia, without mention of fluctuations: Secondary | ICD-10-CM

## 2021-10-07 DIAGNOSIS — E785 Hyperlipidemia, unspecified: Secondary | ICD-10-CM

## 2021-10-07 DIAGNOSIS — M6281 Muscle weakness (generalized): Secondary | ICD-10-CM

## 2021-10-07 DIAGNOSIS — G20 Parkinson's disease: Secondary | ICD-10-CM | POA: Diagnosis not present

## 2021-10-07 DIAGNOSIS — R799 Abnormal finding of blood chemistry, unspecified: Secondary | ICD-10-CM

## 2021-10-07 DIAGNOSIS — R7309 Other abnormal glucose: Secondary | ICD-10-CM

## 2021-10-07 DIAGNOSIS — R278 Other lack of coordination: Secondary | ICD-10-CM | POA: Diagnosis not present

## 2021-10-07 DIAGNOSIS — G8929 Other chronic pain: Secondary | ICD-10-CM

## 2021-10-07 DIAGNOSIS — R490 Dysphonia: Secondary | ICD-10-CM | POA: Diagnosis not present

## 2021-10-07 DIAGNOSIS — R262 Difficulty in walking, not elsewhere classified: Secondary | ICD-10-CM | POA: Diagnosis not present

## 2021-10-07 DIAGNOSIS — Z0000 Encounter for general adult medical examination without abnormal findings: Secondary | ICD-10-CM

## 2021-10-07 DIAGNOSIS — R269 Unspecified abnormalities of gait and mobility: Secondary | ICD-10-CM | POA: Diagnosis not present

## 2021-10-07 DIAGNOSIS — R471 Dysarthria and anarthria: Secondary | ICD-10-CM | POA: Diagnosis not present

## 2021-10-07 DIAGNOSIS — I739 Peripheral vascular disease, unspecified: Secondary | ICD-10-CM

## 2021-10-07 DIAGNOSIS — N400 Enlarged prostate without lower urinary tract symptoms: Secondary | ICD-10-CM

## 2021-10-07 DIAGNOSIS — E559 Vitamin D deficiency, unspecified: Secondary | ICD-10-CM

## 2021-10-07 DIAGNOSIS — R2681 Unsteadiness on feet: Secondary | ICD-10-CM | POA: Diagnosis not present

## 2021-10-07 DIAGNOSIS — R2689 Other abnormalities of gait and mobility: Secondary | ICD-10-CM | POA: Diagnosis not present

## 2021-10-07 NOTE — Therapy (Signed)
OUTPATIENT OCCUPATIONAL THERAPY NEURO TREATMENT  Patient Name: Joseph Arroyo MRN: [id number] DOB:1941-02-25, 81 y.o., male Today's Date: 10/07/2021  PCP: Dr. Nobie Putnam REFERRING PROVIDER: Dr. Lavena Bullion   OT End of Session - 10/07/21 1509     Visit Number 7    Number of Visits 24    Date for OT Re-Evaluation 11/11/21    Authorization Time Period Reporting period beginning 08/19/21    OT Start Time 1439    OT Stop Time 69    OT Time Calculation (min) 45 min    Equipment Utilized During Treatment RW    Activity Tolerance Patient tolerated treatment well    Behavior During Therapy [facility] for tasks assessed/performed              Past Medical History:  Diagnosis Date   Allergic rhinitis due to pollen 11/21/2007   Brachial neuritis or radiculitis NOS    Cervicalgia    Concussion    age 63 - s/p accident   Costal chondritis    Depression    Essential and other specified forms of tremor    GERD (gastroesophageal reflux disease)    in past   Lumbago    Occlusion and stenosis of carotid artery without mention of cerebral infarction    Seizures [facility])    age 56 - after concussion   Past Surgical History:  Procedure Laterality Date   CATARACT EXTRACTION Right 07/18/14   CATARACT EXTRACTION W/PHACO Left 08/01/2014   Procedure: CATARACT EXTRACTION PHACO AND INTRAOCULAR LENS PLACEMENT ([place]);  Surgeon: Leandrew Koyanagi, MD;  Location: [place];  Service: Ophthalmology;  Laterality: Left;  IVA TOPICAL   COLONOSCOPY  2008   OTHER SURGICAL HISTORY  2002   ear surgery   STAPEDES SURGERY Right 2002   Patient Active Problem List   Diagnosis Date Noted   Dysphagia, pharyngeal phase 09/17/2021   Cervicalgia 08/05/2021   Spondylosis without myelopathy or radiculopathy, lumbosacral region 06/24/2021   Lumbosacral facet hypertrophy 06/03/2021   Lumbar central spinal stenosis, w/o neurogenic claudication (L4-5) 06/03/2021   Lumbar lateral recess  stenosis (Bilateral: L2-3, L4-5) (Right: L3-4) 06/03/2021   Lumbosacral foraminal stenosis (Bilateral: L2-3, L3-4) (Left: L5-S1) 06/03/2021   Lumbar nerve root impingement (Right: L3 at L2-3 & L3-4) 06/03/2021   Ligamentum flavum hypertrophy (L3-4, L4-5) 06/03/2021   Abnormal MRI, lumbar spine (04/23/2021) 04/24/2021   Long term prescription benzodiazepine use (alprazolam) (Xanax) 03/24/2021   Grade 1 Retrolisthesis of L2/L3 (5 mm) and L3/L4 (3 mm) 03/24/2021   Levoscoliosis of lumbar spine (L3-4 apex) 03/24/2021   DDD (degenerative disc disease), lumbosacral 03/24/2021   Lumbosacral facet arthropathy (Left: L3-4, L4-5, and L5-S1) 03/24/2021   Tricompartment osteoarthritis of knee (Left) 03/24/2021   Baker cyst (Left) 03/24/2021   Chronic low back pain (1ry area of Pain) (Bilateral) (R>L) w/o sciatica 03/24/2021   Lumbar facet syndrome (Bilateral) 03/24/2021   Chronic lower extremity pain (2ry area of Pain) (Bilateral) (L>R) 03/24/2021   Lumbosacral radiculitis/sensory radiculopathy at L2 (Bilateral) 03/24/2021   Lumbosacral radiculitis/sensory radiculopathy at L3 (Bilateral) 03/24/2021   Chronic pain syndrome 03/23/2021   Pharmacologic therapy 03/23/2021   Disorder of skeletal system 03/23/2021   Problems influencing health status 03/23/2021   PAD (peripheral artery disease) ([place]) 10/01/2020   GAD (generalized anxiety disorder) 01/13/2018   MDD (major depressive disorder) 12/24/8525   Umbilical hernia 78/24/2353   Chronic knee pain (Left) 08/11/2017   Derangement of medial meniscus, posterior horn (Left) 08/11/2017   Vitamin D insufficiency 03/05/2016  BPH without obstruction/lower urinary tract symptoms 07/08/2015   Chronic fatigue 10/23/2014   Parkinson's disease ([place]) 09/20/2014   Major depression, recurrent, full remission ([place]) 07/26/2013   Unsteady gait 07/26/2013   Orthostatic hypotension 07/26/2013   Depression 07/26/2013   Anxiety 06/23/2013   Chronic anxiety 06/23/2013    Tremor 05/18/2013   Bilateral carotid artery stenosis 08/30/2012    ONSET DATE: 08/11/21 date of referral.  PD symptoms since 2015.  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  PERTINENT HISTORY: Per chart, pt with hx of Parkinson's disease ([place]); Chronic knee pain (Left); Derangement of medial meniscus, posterior horn (Left); PAD (peripheral artery disease) ([place]); Chronic pain syndrome; DDD (degenerative disc disease) lumbosacral; osteoarthritis of knee (Left); Chronic low back pain, Lumbar facet syndrome (Bilateral); Chronic lower extremity pain. PRECAUTIONS: Fall  WEIGHT BEARING RESTRICTIONS No  PAIN:  Are you having pain? Yes: NPRS scale: 3/10 Pain location: low back Pain description: achy Aggravating factors: N/A, constant Relieving factors: rest, repositioning  PATIENT GOALS : Increase efficiency with daily tasks, increase [facility] skills   OBJECTIVE:   TODAY'S TREATMENT:  Therapeutic Exercise: Facilitated hand strengthening with use of hand gripper set at 11.2# for 1 trial and 17.9# for trial 2 and 3 to remove jumbo pegs from pegboard using L hand.  Facilitated pinch strengthening with use of therapy resistant clothespins to target lateral and 3 point pinch of L hand.  Able to pinch yellow and red pins well, and green and blue with some pain in the L thumb.  Performed 2 trials for each pinch type noted above with yellow and red pins only, working to increase L hand strength for ADLs.   Self Care: Pt reported that he wanted to be able to check his tire pressure on his cars.  OT advised against pt attempting to transport an air compressor or any long  tubing with his walker in hand, and also discouraged any stooping or reaching down to check pressure from a standing position.  OT advised to have a family member set a chair by the tire to reach from chair level, and have that caregiver provide set up of tubing/air compressor to reduce pt's fall risk.  Advised that this activity should be done with a caregiver present for all set up and positional changes if checking all tires.  Practiced carry over of handwriting strategies learned last session, including postural adjustments, elbows on table, use of wide grip pen, and printing in capital letters on lined paper to write address.  Pt states that he practiced at home with a wide grip pen and spouse reminded pt to go to the kitchen table to prop elbows on table.  Pt required 2 vc for postural adjustment today to prevent slouching and to position elbow and forearm on table top for optimal distal stability.  Good legibility noted with writing address when using these strategies noted above.   PATIENT EDUCATION: Education details: positioning/adaptations to increase Naval architect Person educated: Patient and Spouse Education method: Explanation and Verbal cues Education comprehension: verbalized understanding   HOME EXERCISE PROGRAM:  BUE strengthening with yellow/red theraband   GOALS: Goals reviewed with patient? Yes  SHORT TERM GOALS: Target date: 09/30/21  Pt  will be indep to perform HEP for increasing strength and coordination in B hands.  Baseline: HEP not yet initiated Goal status: INITIAL  2.  Pt will be indep to verbalize 3 fall prevention strategies to implement during ADLs.  Baseline: Not yet initiated Goal status: INITIAL    3 LONG TERM GOALS: 11/11/21  Pt will increase FOTO score by 3 or more points to indicate increased perceived functional performance with daily tasks.  Baseline: FOTO 60 Goal status: INITIAL  2.  Pt will be able to efficiently send short text  messages to family/friends with modified techniques as needed (ie: use of stylus, voice to text) Baseline: Spouse sends texts for pt Goal status: INITIAL  3.  Pt will fill out a check for bill paying with 90% legibility with adapted pen as needed. Baseline: Pt reports inconsistent legibility Goal status: INITIAL  4.  Pt will increase efficiency with dressing tasks, donning clothes in 15 min or less. Baseline: Spouse reports it can take pt 20-30 min to dress in the morning Goal status: INITIAL    ASSESSMENT:  CLINICAL IMPRESSION:   Good tolerance to LUE grip and pinch strengthening exercises this date.  Pt receptive to environmental/task modification for checking tire pressure at home; OT advised pt perform this from chair level and have caregiver set up chair and air compressor so that pt did not risk a fall transporting or setting up these items.  Good carry over noted with handwriting strategies learned last session; pt states that he practiced at home with a wide grip pen and spouse reminded pt to go to the kitchen table to prop elbows on table.  Pt required 2 vc for postural adjustment today to prevent slouching and to position elbow and forearm on table top for optimal distal stability.  Good legibility noted today with writing address when using these strategies noted above.   Pt continues to work on improving  BUE strength and activity tolerance, and [place] skills in order to work towards improving engagement with, and maximizing independence with ADLs, and IADL tasks.     PERFORMANCE DEFICITS in functional skills including ADLs, IADLs, coordination, dexterity, ROM, strength, pain, flexibility, FMC, and GMC, cognitive skills including safety awareness.  IMPAIRMENTS are limiting patient from ADLs, IADLs, and leisure.   COMORBIDITIES has co-morbidities such as scoliosis, chronic back pain and leg pain  that affects occupational performance. Patient will benefit from skilled OT to address  above impairments and improve overall function.  MODIFICATION OR ASSISTANCE TO COMPLETE EVALUATION: No modification of tasks or assist necessary to complete an evaluation.  OT OCCUPATIONAL PROFILE AND HISTORY: Problem focused assessment: Including review of records relating to presenting problem.  CLINICAL DECISION MAKING: Moderate - several treatment options, min-mod task modification necessary  REHAB POTENTIAL: Good  EVALUATION COMPLEXITY: Moderate    PLAN: OT FREQUENCY: 2x/week  OT DURATION: 12 weeks  PLANNED INTERVENTIONS: self care/ADL training, therapeutic exercise, therapeutic activity, neuromuscular re-education, manual therapy, passive range of motion, balance training, functional mobility training, moist heat, cryotherapy, patient/family education, cognitive remediation/compensation, energy conservation, coping strategies training, and DME and/or AE instructions  RECOMMENDED OTHER SERVICES: N/A  CONSULTED AND AGREED WITH PLAN OF CARE: Patient and family member/caregiver  PLAN FOR NEXT SESSION: See above   Leta Speller, MS, OTR/L  Darleene Cleaver, OT 10/07/2021, 3:28 PM

## 2021-10-08 DIAGNOSIS — H903 Sensorineural hearing loss, bilateral: Secondary | ICD-10-CM | POA: Diagnosis not present

## 2021-10-08 DIAGNOSIS — H6063 Unspecified chronic otitis externa, bilateral: Secondary | ICD-10-CM | POA: Diagnosis not present

## 2021-10-08 DIAGNOSIS — H906 Mixed conductive and sensorineural hearing loss, bilateral: Secondary | ICD-10-CM | POA: Diagnosis not present

## 2021-10-09 ENCOUNTER — Ambulatory Visit: Payer: Medicare HMO

## 2021-10-09 ENCOUNTER — Encounter: Payer: Medicare HMO | Admitting: Speech Pathology

## 2021-10-09 ENCOUNTER — Ambulatory Visit: Payer: Medicare HMO | Admitting: Speech Pathology

## 2021-10-09 DIAGNOSIS — R490 Dysphonia: Secondary | ICD-10-CM | POA: Diagnosis not present

## 2021-10-09 DIAGNOSIS — G20 Parkinson's disease: Secondary | ICD-10-CM

## 2021-10-09 DIAGNOSIS — R262 Difficulty in walking, not elsewhere classified: Secondary | ICD-10-CM

## 2021-10-09 DIAGNOSIS — R2681 Unsteadiness on feet: Secondary | ICD-10-CM | POA: Diagnosis not present

## 2021-10-09 DIAGNOSIS — R2689 Other abnormalities of gait and mobility: Secondary | ICD-10-CM | POA: Diagnosis not present

## 2021-10-09 DIAGNOSIS — R471 Dysarthria and anarthria: Secondary | ICD-10-CM

## 2021-10-09 DIAGNOSIS — M6281 Muscle weakness (generalized): Secondary | ICD-10-CM

## 2021-10-09 DIAGNOSIS — R269 Unspecified abnormalities of gait and mobility: Secondary | ICD-10-CM

## 2021-10-09 DIAGNOSIS — R278 Other lack of coordination: Secondary | ICD-10-CM | POA: Diagnosis not present

## 2021-10-09 NOTE — Therapy (Signed)
OUTPATIENT PHYSICAL THERAPY NEURO TREATMENT   Patient Name: Joseph Arroyo MRN: [id number] DOB:1941/01/14, 81 y.o., male Today's Date: 10/09/2021   PCP: Joseph Hauser, DO REFERRING PROVIDER: Lavena Bullion, NP   PT End of Session - 10/09/21 1609     Visit Number 6    Number of Visits 25    Date for PT Re-Evaluation 11/13/21    Authorization Type [employer]    Authorization Time Period Initial Cert 8/46/9629- 07/28/4130    Progress Note Due on Visit 10    PT Start Time 1604    PT Stop Time 1649    PT Time Calculation (min) 45 min    Equipment Utilized During Treatment Gait belt    Activity Tolerance Patient tolerated treatment well    Behavior During Therapy [facility] for tasks assessed/performed               Past Medical History:  Diagnosis Date   Allergic rhinitis due to pollen 11/21/2007   Brachial neuritis or radiculitis NOS    Cervicalgia    Concussion    age 4 - s/p accident   Costal chondritis    Depression    Essential and other specified forms of tremor    GERD (gastroesophageal reflux disease)    in past   Lumbago    Occlusion and stenosis of carotid artery without mention of cerebral infarction    Seizures [facility])    age 80 - after concussion   Past Surgical History:  Procedure Laterality Date   CATARACT EXTRACTION Right 07/18/14   CATARACT EXTRACTION W/PHACO Left 08/01/2014   Procedure: CATARACT EXTRACTION PHACO AND INTRAOCULAR LENS PLACEMENT ([place]);  Surgeon: Joseph Koyanagi, MD;  Location: [place];  Service: Ophthalmology;  Laterality: Left;  IVA TOPICAL   COLONOSCOPY  2008   OTHER SURGICAL HISTORY  2002   ear surgery   STAPEDES SURGERY Right 2002   Patient Active Problem List   Diagnosis Date Noted   Dysphagia, pharyngeal phase 09/17/2021   Cervicalgia 08/05/2021   Spondylosis without myelopathy or radiculopathy, lumbosacral region 06/24/2021   Lumbosacral facet hypertrophy 06/03/2021   Lumbar central spinal  stenosis, w/o neurogenic claudication (L4-5) 06/03/2021   Lumbar lateral recess stenosis (Bilateral: L2-3, L4-5) (Right: L3-4) 06/03/2021   Lumbosacral foraminal stenosis (Bilateral: L2-3, L3-4) (Left: L5-S1) 06/03/2021   Lumbar nerve root impingement (Right: L3 at L2-3 & L3-4) 06/03/2021   Ligamentum flavum hypertrophy (L3-4, L4-5) 06/03/2021   Abnormal MRI, lumbar spine (04/23/2021) 04/24/2021   Long term prescription benzodiazepine use (alprazolam) (Xanax) 03/24/2021   Grade 1 Retrolisthesis of L2/L3 (5 mm) and L3/L4 (3 mm) 03/24/2021   Levoscoliosis of lumbar spine (L3-4 apex) 03/24/2021   DDD (degenerative disc disease), lumbosacral 03/24/2021   Lumbosacral facet arthropathy (Left: L3-4, L4-5, and L5-S1) 03/24/2021   Tricompartment osteoarthritis of knee (Left) 03/24/2021   Baker cyst (Left) 03/24/2021   Chronic low back pain (1ry area of Pain) (Bilateral) (R>L) w/o sciatica 03/24/2021   Lumbar facet syndrome (Bilateral) 03/24/2021   Chronic lower extremity pain (2ry area of Pain) (Bilateral) (L>R) 03/24/2021   Lumbosacral radiculitis/sensory radiculopathy at L2 (Bilateral) 03/24/2021   Lumbosacral radiculitis/sensory radiculopathy at L3 (Bilateral) 03/24/2021   Chronic pain syndrome 03/23/2021   Pharmacologic therapy 03/23/2021   Disorder of skeletal system 03/23/2021   Problems influencing health status 03/23/2021   PAD (peripheral artery disease) ([place]) 10/01/2020   GAD (generalized anxiety disorder) 01/13/2018   MDD (major depressive disorder) 44/03/270   Umbilical hernia 53/66/4403  Chronic knee pain (Left) 08/11/2017   Derangement of medial meniscus, posterior horn (Left) 08/11/2017   Vitamin D insufficiency 03/05/2016   BPH without obstruction/lower urinary tract symptoms 07/08/2015   Chronic fatigue 10/23/2014   Parkinson's disease ([place]) 09/20/2014   Major depression, recurrent, full remission ([place]) 07/26/2013   Unsteady gait 07/26/2013   Orthostatic hypotension  07/26/2013   Depression 07/26/2013   Anxiety 06/23/2013   Chronic anxiety 06/23/2013   Tremor 05/18/2013   Bilateral carotid artery stenosis 08/30/2012    ONSET DATE: 08/11/2021  REFERRING DIAG: Parkinsons Disease  THERAPY DIAG:  Difficulty in walking, not elsewhere classified  Muscle weakness (generalized)  Abnormality of gait and mobility  Unsteadiness on feet  Rationale for Evaluation and Treatment Rehabilitation  SUBJECTIVE:                                                                                                                                                                                              SUBJECTIVE STATEMENT: Patient reports his back is bothering him today. Is with his daughter and wife today.   Pt accompanied by: significant other  PERTINENT HISTORY: Per chart history provided by Joseph Arroyo- Joseph Arroyo has Parkinson's disease ([place]); Chronic knee pain (Left); Derangement of medial meniscus, posterior horn (Left); PAD (peripheral artery disease) ([place]); Chronic pain syndrome; Grade 1 Retrolisthesis of L2/L3 (5 mm) and L3/L4 (3 mm); Levoscoliosis of lumbar spine (L3-4 apex); DDD (degenerative disc disease), lumbosacral; Lumbosacral facet arthropathy (Left: L3-4, L4-5, and L5-S1); Tricompartment osteoarthritis of knee (Left); Baker cyst (Left); Chronic low back pain (1ry area of Pain) (Bilateral) (R>L) w/o sciatica; Lumbar facet syndrome (Bilateral); Chronic lower extremity pain (2ry area of Pain) (Bilateral) (L>R); Lumbosacral radiculitis/sensory radiculopathy at L2 (Bilateral); Lumbosacral radiculitis/sensory radiculopathy at L3 (Bilateral); Abnormal MRI, lumbar spine (04/23/2021); Lumbosacral facet hypertrophy; Lumbar central spinal stenosis, w/o neurogenic claudication (L4-5); Lumbar lateral recess stenosis (Bilateral: L2-3, L4-5) (Right: L3-4); Lumbosacral foraminal stenosis (Bilateral: L2-3, L3-4) (Left: L5-S1); Lumbar nerve root impingement (Right: L3  at L2-3 & L3-4); Ligamentum flavum hypertrophy (L3-4, L4-5); Spondylosis without myelopathy or radiculopathy, lumbosacral region; and Cervicalgia on their pertinent problem list   Patient has appointment for Epidural to low back next Tues 08/26/2021  PAIN:  Are you having pain? Yes: NPRS scale: 8/10 Pain location: low back Pain description: ache/soreness Aggravating factors: prolonged standing, transfers, walking Relieving factors: rest and medications  PRECAUTIONS: Fall  WEIGHT BEARING RESTRICTIONS No  FALLS: Has patient fallen in last 6 months? Yes. Number of falls 2  LIVING ENVIRONMENT: Lives with: lives with their spouse Lives in: House/apartment Stairs: 4 Has following equipment at home: Gilford Rile -  2 wheeled  PLOF: Needs assistance with ADLs- intermittent  PATIENT GOALS  to walk better and be as active as possible. Also to get in/out of cars better.   OBJECTIVE: (objective measures completed at initial evaluation unless otherwise dated)   DIAGNOSTIC FINDINGS: IMPRESSION: 1. Severe lumbar levoscoliosis with widespread advanced disc and facet degeneration. 2. Mild spinal stenosis at L4-5. 3. Moderate neural foraminal stenosis on the right at L3-4 and on the left at L5-S1. 4. Moderate right lateral recess stenosis at L2-3.      LOWER EXTREMITY MMT:    MMT (evaluation)  Right Eval Left Eval  Hip flexion 4 4  Hip extension 4 4  Hip abduction 4 4  Hip adduction 4 4  Hip internal rotation 4 4  Hip external rotation 4 4  Knee flexion 4 4  Knee extension 4 4  Ankle dorsiflexion 4 4  Ankle plantarflexion 4 4  Ankle inversion 4 4  Ankle eversion 4 4  (Blank rows = not tested)     FUNCTIONAL TESTS (EVALUATION):  5 times sit to stand: 14 sec without UE support Timed up and go (TUG): 15.67 sec with RW 10 meter walk test: 10.67 sec = 0.94 m/s using RW Berg Balance Scale: 48/56- see flowsheet for details  PATIENT SURVEYS:  FOTO : 49  TODAY'S TREATMENT  10/09/21  BP: 128/78  TherEx  Ball roll outs x 10 each direction (3 way) Seated overhead press with 1 kilo weighted ball, with focal point on postural lumbar and UE extension,x10 Seated cross body punches at varying heights into boxing gloves, 30 sec bouts x3 Step over hedgehog laterally seated 10x each LE; slight pain increase in L hip    NMR:   Standing with CGA next to support surface:  Airex pad: static stand 30 seconds x 2 trials, noticeable trembling of ankles/LE's with fatigue and challenge to maintain stability Airex pad: horizontal head turns 30 seconds scanning room 10x ; cueing for arc of motion  Airex pad: vertical head turns 30 seconds, cueing for arc of motion, noticeable sway with upward gaze increasing demand on ankle righting reaction musculature Airex pad: one foot on 6" step one foot on airex pad, hold position for 30 seconds, switch legs, 2x each LE; Airex pad RTB row 20x, straight arm lat pulldown 10x    Seated on dynadisc:  -static position with stabilization to front of patient 60 seconds -alternating UE raises 8x each UE -shoulder extension with 1 kilo yellow ball -alternating march 10x each LE      PATIENT EDUCATION: Education details: need to monitor BP closely  Person educated: Patient Education method: Explanation, Demonstration, Tactile cues, and Verbal cues Education comprehension: verbalized understanding, returned demonstration, verbal cues required, tactile cues required, and needs further education   HOME EXERCISE PROGRAM: PWR! Basic 4 seated x 10 ea, handout provided: addended to not perform seated PRW! Rock exercise and added the below to program on 09/25/21  Access Code: [email or web address] URL: [email or web address] Date: 09/25/2021 Prepared by: Rivka Barbara  Exercises - Standing Romberg to 3/4 Tandem Stance  - 1 x daily - 7 x weekly - 2 sets - 30 seocnd hold - Standing March with Counter Support  - 1 x daily - 7 x weekly - 2  sets - 10 reps - Modified Single Leg Balance on Step  - 1 x daily - 7 x weekly - 2 sets - 30 second hold    GOALS: Goals reviewed with patient? Yes  SHORT TERM  GOALS: Target date: 10/02/2021  Pt will be independent with initial HEP in order to improve strength and balance in order to decrease fall risk and improve function at home and work.  Baseline: 08/21/2021-No formal HEP in place Goal status: INITIAL    LONG TERM GOALS: Target date: 9/14//2023  Pt will improve BERG by at least 3 points in order to demonstrate clinically significant improvement in balance.    Baseline: 08/21/2021= 48/56 Goal status: INITIAL  2.  Pt will be independent with final HEP in order to improve strength and balance in order to decrease fall risk and improve function at home and work.  Baseline: No HEP in place Goal status: INITIAL  3.  Pt will decrease 5TSTS by at least 4 seconds in order to demonstrate clinically significant improvement in LE strength. Baseline: 08/21/2021= 19.01 sec without UE support Goal status: INITIAL  4.  Pt will decrease TUG to below 14 seconds/decrease in order to demonstrate decreased fall risk. Baseline:  08/21/2021=15.67 with RW Goal status: INITIAL  5.  Pt will improve FOTO to target score of 56 to display perceived improvements in ability to complete ADL's.  Baseline: 08/21/2021=49 Goal status: INITIAL  6.  Pt will increase 10MWT by at least 0.15 m/s in order to demonstrate clinically significant improvement in community ambulation.   Baseline: 08/22/2021=0.94 m/s Goal status: INITIAL  ASSESSMENT:  CLINICAL IMPRESSION:  Pt tolerated treatment session well and put forth good effort throughout the entire session.  Pt notes to have increased pain today in the lumbar spine as noted in subjective and was given heat pack in order to assist with alleviating his pain.  Pt performed well with newly introduced exercises and enjoyed participating in the boxing exercise as well.   Will continue to benefit from skilled therapy in order to address remaining deficits in order to improve overall QoL.    OBJECTIVE IMPAIRMENTS Abnormal gait, decreased activity tolerance, decreased balance, decreased coordination, decreased endurance, decreased knowledge of use of DME, decreased mobility, difficulty walking, decreased ROM, decreased strength, hypomobility, and impaired flexibility.   ACTIVITY LIMITATIONS carrying, lifting, bending, standing, squatting, sleeping, stairs, transfers, continence, bathing, toileting, and dressing  PARTICIPATION LIMITATIONS: cleaning, laundry, medication management, driving, shopping, community activity, and yard work  PERSONAL FACTORS Age are also affecting patient's functional outcome.   REHAB POTENTIAL: Good  CLINICAL DECISION MAKING: Stable/uncomplicated  EVALUATION COMPLEXITY: Low  PLAN: PT FREQUENCY: 2x/week  PT DURATION: 12 weeks  PLANNED INTERVENTIONS: Therapeutic exercises, Therapeutic activity, Neuromuscular re-education, Balance training, Gait training, Patient/Family education, Joint mobilization, Stair training, Vestibular training, Canalith repositioning, DME instructions, Dry Needling, Electrical stimulation, Spinal manipulation, and Spinal mobilization  PLAN FOR NEXT SESSION: continue balance training, monitor hypotension and orthostatics, ROM for flexibility,    Gwenlyn Saran, PT, DPT 10/09/21, 5:35 PM

## 2021-10-09 NOTE — Therapy (Signed)
OUTPATIENT SPEECH LANGUAGE PATHOLOGY PARKINSON'S Treatment    Patient Name: Joseph Arroyo MRN: [id number] DOB:[birth year], 81 y.o., male Today's Date: 10/09/2021  PCP: Dr Nobie Putnam REFERRING PROVIDER: Dr Lavena Bullion   End of Session - 10/09/21 1118     Visit Number 12    Number of Visits 25    Date for SLP Re-Evaluation 11/12/21    Authorization Type [employer] Medicare/HMO PPO    Progress Note Due on Visit 10    SLP Start Time 34    SLP Stop Time  1400    SLP Time Calculation (min) 60 min    Activity Tolerance Patient tolerated treatment well             Past Medical History:  Diagnosis Date   Allergic rhinitis due to pollen 11/21/2007   Brachial neuritis or radiculitis NOS    Cervicalgia    Concussion    age 43 - s/p accident   Costal chondritis    Depression    Essential and other specified forms of tremor    GERD (gastroesophageal reflux disease)    in past   Lumbago    Occlusion and stenosis of carotid artery without mention of cerebral infarction    Seizures [facility])    age 73 - after concussion   Past Surgical History:  Procedure Laterality Date   CATARACT EXTRACTION Right 07/18/14   CATARACT EXTRACTION W/PHACO Left 08/01/2014   Procedure: CATARACT EXTRACTION PHACO AND INTRAOCULAR LENS PLACEMENT ([place]);  Surgeon: Leandrew Koyanagi, MD;  Location: [place];  Service: Ophthalmology;  Laterality: Left;  IVA TOPICAL   COLONOSCOPY  2008   OTHER SURGICAL HISTORY  2002   ear surgery   STAPEDES SURGERY Right 2002   Patient Active Problem List   Diagnosis Date Noted   Dysphagia, pharyngeal phase 09/17/2021   Cervicalgia 08/05/2021   Spondylosis without myelopathy or radiculopathy, lumbosacral region 06/24/2021   Lumbosacral facet hypertrophy 06/03/2021   Lumbar central spinal stenosis, w/o neurogenic claudication (L4-5) 06/03/2021   Lumbar lateral recess stenosis (Bilateral: L2-3, L4-5) (Right: L3-4) 06/03/2021   Lumbosacral  foraminal stenosis (Bilateral: L2-3, L3-4) (Left: L5-S1) 06/03/2021   Lumbar nerve root impingement (Right: L3 at L2-3 & L3-4) 06/03/2021   Ligamentum flavum hypertrophy (L3-4, L4-5) 06/03/2021   Abnormal MRI, lumbar spine (04/23/2021) 04/24/2021   Long term prescription benzodiazepine use (alprazolam) (Xanax) 03/24/2021   Grade 1 Retrolisthesis of L2/L3 (5 mm) and L3/L4 (3 mm) 03/24/2021   Levoscoliosis of lumbar spine (L3-4 apex) 03/24/2021   DDD (degenerative disc disease), lumbosacral 03/24/2021   Lumbosacral facet arthropathy (Left: L3-4, L4-5, and L5-S1) 03/24/2021   Tricompartment osteoarthritis of knee (Left) 03/24/2021   Baker cyst (Left) 03/24/2021   Chronic low back pain (1ry area of Pain) (Bilateral) (R>L) w/o sciatica 03/24/2021   Lumbar facet syndrome (Bilateral) 03/24/2021   Chronic lower extremity pain (2ry area of Pain) (Bilateral) (L>R) 03/24/2021   Lumbosacral radiculitis/sensory radiculopathy at L2 (Bilateral) 03/24/2021   Lumbosacral radiculitis/sensory radiculopathy at L3 (Bilateral) 03/24/2021   Chronic pain syndrome 03/23/2021   Pharmacologic therapy 03/23/2021   Disorder of skeletal system 03/23/2021   Problems influencing health status 03/23/2021   PAD (peripheral artery disease) ([place]) 10/01/2020   GAD (generalized anxiety disorder) 01/13/2018   MDD (major depressive disorder) 27/08/8673   Umbilical hernia 44/92/0100   Chronic knee pain (Left) 08/11/2017   Derangement of medial meniscus, posterior horn (Left) 08/11/2017   Vitamin D insufficiency 03/05/2016   BPH without obstruction/lower urinary tract symptoms 07/08/2015  Chronic fatigue 10/23/2014   Parkinson's disease ([place]) 09/20/2014   Major depression, recurrent, full remission ([place]) 07/26/2013   Unsteady gait 07/26/2013   Orthostatic hypotension 07/26/2013   Depression 07/26/2013   Anxiety 06/23/2013   Chronic anxiety 06/23/2013   Tremor 05/18/2013   Bilateral carotid artery stenosis 08/30/2012     ONSET DATE: 05/28/2015; referral date 08/11/2021  REFERRING DIAG: G20 (ICD-10-CM) - Parkinson's disease  THERAPY DIAG:  Dysarthria and anarthria  Dysphonia  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 Yes: NPRS scale: 2/10 Pain location: back Pain description: lower; right; left Aggravating factors: sitting Relieving factors: under care of pain doctor; receiving epidurals  PATIENT GOALS pt's wife states her goal as "so other people can understand him"  OBJECTIVE:   TODAY'S TREATMENT:  Pt continues to experience difficulty with carryover of therapy activities, thus his progress is very slow and his speech intelligibility remains largely unchanged. Today his speech is c/b rast rate, imprecaise articulation, decreased intonation and low vocal quality resulting in ~ 60% speech intelligibility at the simple conversation level. Extensive time spent discussing guarded prognosis for improved speech intelligibility d/t multiple comorbidilities (deficits in memory, dyskensias, past courses of ST services). Pt has a strong community of faith, so in an effort to promote increased carryover of speech intelligibility strategies, SLP presented pt with task of creating a Bible Study. Pt was instructed to practice at home with his wife and to bring lesson into next therapy session to target speech intelligibility during functional motivating activity.   SLP further facilitated session by having pt and his wife completed the Communication Effectiveness Scale. Pt reports 15 out of 32.  Which is an improvement from 11 out of 32 during initial evaluation.   PATIENT EDUCATION: Education details: guarded prognosis, minimal progress towards  goals Person educated: Patient and Spouse Education method: Explanation, Demonstration, and Verbal cues Education comprehension: verbalized understanding and needs further education       GOALS: Goals reviewed with patient? Yes  SHORT TERM GOALS: Target date: 10 therapy sessions  The patient produce "ah", High/Lows, and Functional Phrases at average loudness >/= 80 dB and with loud, good quality voice with moderate cues.             Baseline: maximal cues Goal status: MET  2.  The patient will complete reading drills (words/phrases, sentences) at average >/= 75 dB and with loud, good quality voice with moderate cues.  Baseline: maximal cues Goal status: MET  3.   The patient will participate in 5-8 minutes conversation, maintaining average loudness of 75 dB and loud, good quality voice with min cues.  Baseline: maximal cues Goal status: Ongoing  4.  The patient will complete an instrumental evaluation (MBSS) within 3 weeks in order to evaluate swallowing safety. Baseline: last MBSS 2017 Goal status: Met   LONG TERM GOALS: Target date: 11/12/2021    The patient will participate in 15-20 minutes conversation, maintaining average loudness of 75 dB and loud, good quality voice. Baseline: 60dB Goal status: Ongoing  2.   Patient will report improved communication effectiveness as measured by Communicative Effectiveness Survey.   Baseline: 11/32 Goal status: Ongoing  3.  Pt will consume least restrictive diet with no s/s of respiratory decline.  Baseline: new goal Goal status:  Met   ASSESSMENT:  CLINICAL IMPRESSION:  SLP further facilitated goal from pt and goal from pt's wife. Pt's states his goal as "I want to be able to speak clearly and they won't have to ask me to repeat." Wife states her goal for pt as "To be able to understand him and not have to ask him to repeat himself 3 or 4 times." Prognosis for meeting these goals is guarded at this time. Although this  informaiton has been provided to pt and his wife, they would like benefit from having further instruction.   OBJECTIVE IMPAIRMENTS  Objective impairments include voice disorder and dysphagia. These impairments are limiting patient from effectively communicating at home and in community and safety when swallowing.Factors affecting potential to achieve goals and functional outcome are co-morbidities, medical prognosis, pain level, and severity of impairments.Patient will benefit from skilled SLP services to address above impairments and improve overall function.  REHAB POTENTIAL: Fair multiple previous ST sessions; progressive severe chronic back pain  PLAN: SLP FREQUENCY: 2x/week  SLP DURATION: 12 weeks  PLANNED INTERVENTIONS: Aspiration precaution training, Pharyngeal strengthening exercises, Diet toleration management , Trials of upgraded texture/liquids, SLP instruction and feedback, and Compensatory strategies   Salvadore Valvano B. Rutherford Nail, M.S., CCC-SLP, Mining engineer Certified Brain Injury [place]  [place] Office [phone] Ascom [phone] Fax [phone]

## 2021-10-10 ENCOUNTER — Other Ambulatory Visit: Payer: Medicare HMO

## 2021-10-10 DIAGNOSIS — I739 Peripheral vascular disease, unspecified: Secondary | ICD-10-CM

## 2021-10-10 DIAGNOSIS — E559 Vitamin D deficiency, unspecified: Secondary | ICD-10-CM

## 2021-10-10 DIAGNOSIS — G20 Parkinson's disease: Secondary | ICD-10-CM | POA: Diagnosis not present

## 2021-10-10 DIAGNOSIS — R799 Abnormal finding of blood chemistry, unspecified: Secondary | ICD-10-CM | POA: Diagnosis not present

## 2021-10-10 DIAGNOSIS — R7309 Other abnormal glucose: Secondary | ICD-10-CM

## 2021-10-10 DIAGNOSIS — N400 Enlarged prostate without lower urinary tract symptoms: Secondary | ICD-10-CM

## 2021-10-10 DIAGNOSIS — Z0000 Encounter for general adult medical examination without abnormal findings: Secondary | ICD-10-CM | POA: Diagnosis not present

## 2021-10-10 DIAGNOSIS — E785 Hyperlipidemia, unspecified: Secondary | ICD-10-CM

## 2021-10-11 LAB — COMPLETE METABOLIC PANEL WITH GFR
AG Ratio: 1.7 (calc) (ref 1.0–2.5)
ALT: 6 U/L — ABNORMAL LOW (ref 9–46)
AST: 16 U/L (ref 10–35)
Albumin: 4 g/dL (ref 3.6–5.1)
Alkaline phosphatase (APISO): 44 U/L (ref 35–144)
BUN: 20 mg/dL (ref 7–25)
CO2: 31 mmol/L (ref 20–32)
Calcium: 9 mg/dL (ref 8.6–10.3)
Chloride: 97 mmol/L — ABNORMAL LOW (ref 98–110)
Creat: 0.77 mg/dL (ref 0.70–1.28)
Globulin: 2.3 g/dL (calc) (ref 1.9–3.7)
Glucose, Bld: 85 mg/dL (ref 65–99)
Potassium: 4.2 mmol/L (ref 3.5–5.3)
Sodium: 133 mmol/L — ABNORMAL LOW (ref 135–146)
Total Bilirubin: 0.7 mg/dL (ref 0.2–1.2)
Total Protein: 6.3 g/dL (ref 6.1–8.1)
eGFR: 91 mL/min/{1.73_m2} (ref >=60)

## 2021-10-11 LAB — CBC WITH DIFFERENTIAL/PLATELET
Absolute Monocytes: 442 cells/uL (ref 200–950)
Basophils Absolute: 62 cells/uL (ref 0–200)
Basophils Relative: 1.1 %
Eosinophils Absolute: 258 cells/uL (ref 15–500)
Eosinophils Relative: 4.6 %
HCT: 42.3 % (ref 38.5–50.0)
Hemoglobin: 14.5 g/dL (ref 13.2–17.1)
Lymphs Abs: 1210 cells/uL (ref 850–3900)
MCH: 32.8 pg (ref 27.0–33.0)
MCHC: 34.3 g/dL (ref 32.0–36.0)
MCV: 95.7 fL (ref 80.0–100.0)
MPV: 9.5 fL (ref 7.5–12.5)
Monocytes Relative: 7.9 %
Neutro Abs: 3629 cells/uL (ref 1500–7800)
Neutrophils Relative %: 64.8 %
Platelets: 193 10*3/uL (ref 140–400)
RBC: 4.42 10*6/uL (ref 4.20–5.80)
RDW: 11.7 % (ref 11.0–15.0)
Total Lymphocyte: 21.6 %
WBC: 5.6 10*3/uL (ref 3.8–10.8)

## 2021-10-11 LAB — PSA: PSA: 1.33 ng/mL (ref <=4.00)

## 2021-10-11 LAB — LIPID PANEL
Cholesterol: 138 mg/dL (ref <200)
HDL: 63 mg/dL (ref >=40)
LDL Cholesterol (Calc): 62 mg/dL (calc)
Non-HDL Cholesterol (Calc): 75 mg/dL (calc) (ref <130)
Total CHOL/HDL Ratio: 2.2 (calc) (ref <5.0)
Triglycerides: 58 mg/dL (ref <150)

## 2021-10-11 LAB — TSH: TSH: 0.97 mIU/L (ref 0.40–4.50)

## 2021-10-11 LAB — HEMOGLOBIN A1C
Hgb A1c MFr Bld: 4.9 % of total Hgb (ref <5.7)
Mean Plasma Glucose: 94 mg/dL
eAG (mmol/L): 5.2 mmol/L

## 2021-10-11 LAB — VITAMIN D 25 HYDROXY (VIT D DEFICIENCY, FRACTURES): Vit D, 25-Hydroxy: 42 ng/mL (ref 30–100)

## 2021-10-13 ENCOUNTER — Ambulatory Visit (INDEPENDENT_AMBULATORY_CARE_PROVIDER_SITE_OTHER): Payer: Medicare HMO

## 2021-10-13 VITALS — BP 114/53 | HR 57 | Temp 97.6°F | Resp 18 | Ht 63.5 in | Wt 132.4 lb

## 2021-10-13 DIAGNOSIS — Z0000 Encounter for general adult medical examination without abnormal findings: Secondary | ICD-10-CM

## 2021-10-13 NOTE — Therapy (Signed)
OUTPATIENT PHYSICAL THERAPY NEURO TREATMENT   Patient Name: Joseph Arroyo MRN: [id number] DOB:21-Oct-1940, 81 y.o., male Today's Date: 10/14/2021   PCP: Olin Hauser, DO REFERRING PROVIDER: Lavena Bullion, NP   PT End of Session - 10/14/21 1545     Visit Number 7    Number of Visits 25    Date for PT Re-Evaluation 11/13/21    Authorization Type Aetna Medicare    Authorization Time Period Initial Cert 6/76/1950- 9/32/6712    Progress Note Due on Visit 10    PT Start Time 1602    PT Stop Time 1644    PT Time Calculation (min) 42 min    Equipment Utilized During Treatment Gait belt    Activity Tolerance Patient tolerated treatment well    Behavior During Therapy [facility] for tasks assessed/performed                Past Medical History:  Diagnosis Date   Allergic rhinitis due to pollen 11/21/2007   Brachial neuritis or radiculitis NOS    Cervicalgia    Concussion    age 66 - s/p accident   Costal chondritis    Depression    Essential and other specified forms of tremor    GERD (gastroesophageal reflux disease)    in past   Lumbago    Occlusion and stenosis of carotid artery without mention of cerebral infarction    Seizures [facility])    age 16 - after concussion   Past Surgical History:  Procedure Laterality Date   CATARACT EXTRACTION Right 07/18/14   CATARACT EXTRACTION W/PHACO Left 08/01/2014   Procedure: CATARACT EXTRACTION PHACO AND INTRAOCULAR LENS PLACEMENT ([place]);  Surgeon: Leandrew Koyanagi, MD;  Location: [place];  Service: Ophthalmology;  Laterality: Left;  IVA TOPICAL   COLONOSCOPY  2008   OTHER SURGICAL HISTORY  2002   ear surgery   STAPEDES SURGERY Right 2002   Patient Active Problem List   Diagnosis Date Noted   Dysphagia, pharyngeal phase 09/17/2021   Cervicalgia 08/05/2021   Spondylosis without myelopathy or radiculopathy, lumbosacral region 06/24/2021   Lumbosacral facet hypertrophy 06/03/2021   Lumbar central spinal  stenosis, w/o neurogenic claudication (L4-5) 06/03/2021   Lumbar lateral recess stenosis (Bilateral: L2-3, L4-5) (Right: L3-4) 06/03/2021   Lumbosacral foraminal stenosis (Bilateral: L2-3, L3-4) (Left: L5-S1) 06/03/2021   Lumbar nerve root impingement (Right: L3 at L2-3 & L3-4) 06/03/2021   Ligamentum flavum hypertrophy (L3-4, L4-5) 06/03/2021   Abnormal MRI, lumbar spine (04/23/2021) 04/24/2021   Long term prescription benzodiazepine use (alprazolam) (Xanax) 03/24/2021   Grade 1 Retrolisthesis of L2/L3 (5 mm) and L3/L4 (3 mm) 03/24/2021   Levoscoliosis of lumbar spine (L3-4 apex) 03/24/2021   DDD (degenerative disc disease), lumbosacral 03/24/2021   Lumbosacral facet arthropathy (Left: L3-4, L4-5, and L5-S1) 03/24/2021   Tricompartment osteoarthritis of knee (Left) 03/24/2021   Baker cyst (Left) 03/24/2021   Chronic low back pain (1ry area of Pain) (Bilateral) (R>L) w/o sciatica 03/24/2021   Lumbar facet syndrome (Bilateral) 03/24/2021   Chronic lower extremity pain (2ry area of Pain) (Bilateral) (L>R) 03/24/2021   Lumbosacral radiculitis/sensory radiculopathy at L2 (Bilateral) 03/24/2021   Lumbosacral radiculitis/sensory radiculopathy at L3 (Bilateral) 03/24/2021   Chronic pain syndrome 03/23/2021   Pharmacologic therapy 03/23/2021   Disorder of skeletal system 03/23/2021   Problems influencing health status 03/23/2021   PAD (peripheral artery disease) ([place]) 10/01/2020   GAD (generalized anxiety disorder) 01/13/2018   MDD (major depressive disorder) 45/80/9983   Umbilical hernia 38/25/0539  Chronic knee pain (Left) 08/11/2017   Derangement of medial meniscus, posterior horn (Left) 08/11/2017   Vitamin D insufficiency 03/05/2016   BPH without obstruction/lower urinary tract symptoms 07/08/2015   Chronic fatigue 10/23/2014   Parkinson's disease ([place]) 09/20/2014   Major depression, recurrent, full remission ([place]) 07/26/2013   Unsteady gait 07/26/2013   Orthostatic hypotension  07/26/2013   Depression 07/26/2013   Anxiety 06/23/2013   Chronic anxiety 06/23/2013   Tremor 05/18/2013   Bilateral carotid artery stenosis 08/30/2012    ONSET DATE: 08/11/2021  REFERRING DIAG: Parkinsons Disease  THERAPY DIAG:  Difficulty in walking, not elsewhere classified  Muscle weakness (generalized)  Abnormality of gait and mobility  Unsteadiness on feet  Rationale for Evaluation and Treatment Rehabilitation  SUBJECTIVE:                                                                                                                                                                                              SUBJECTIVE STATEMENT: Patient reports no falls or LOB since last session. Had to park today without [place].   Pt accompanied by: significant other  PERTINENT HISTORY: Per chart history provided by Dr. Consuela Mimes- [name] has Parkinson's disease ([place]); Chronic knee pain (Left); Derangement of medial meniscus, posterior horn (Left); PAD (peripheral artery disease) ([place]); Chronic pain syndrome; Grade 1 Retrolisthesis of L2/L3 (5 mm) and L3/L4 (3 mm); Levoscoliosis of lumbar spine (L3-4 apex); DDD (degenerative disc disease), lumbosacral; Lumbosacral facet arthropathy (Left: L3-4, L4-5, and L5-S1); Tricompartment osteoarthritis of knee (Left); Baker cyst (Left); Chronic low back pain (1ry area of Pain) (Bilateral) (R>L) w/o sciatica; Lumbar facet syndrome (Bilateral); Chronic lower extremity pain (2ry area of Pain) (Bilateral) (L>R); Lumbosacral radiculitis/sensory radiculopathy at L2 (Bilateral); Lumbosacral radiculitis/sensory radiculopathy at L3 (Bilateral); Abnormal MRI, lumbar spine (04/23/2021); Lumbosacral facet hypertrophy; Lumbar central spinal stenosis, w/o neurogenic claudication (L4-5); Lumbar lateral recess stenosis (Bilateral: L2-3, L4-5) (Right: L3-4); Lumbosacral foraminal stenosis (Bilateral: L2-3, L3-4) (Left: L5-S1); Lumbar nerve root impingement (Right: L3 at  L2-3 & L3-4); Ligamentum flavum hypertrophy (L3-4, L4-5); Spondylosis without myelopathy or radiculopathy, lumbosacral region; and Cervicalgia on their pertinent problem list   Patient has appointment for Epidural to low back next Tues 08/26/2021  PAIN:  Are you having pain? Yes: NPRS scale: 8/10 Pain location: low back Pain description: ache/soreness Aggravating factors: prolonged standing, transfers, walking Relieving factors: rest and medications  PRECAUTIONS: Fall  WEIGHT BEARING RESTRICTIONS No  FALLS: Has patient fallen in last 6 months? Yes. Number of falls 2  LIVING ENVIRONMENT: Lives with: lives with their spouse Lives in: House/apartment Stairs: 4 Has following equipment at home: Gilford Rile -  2 wheeled  PLOF: Needs assistance with ADLs- intermittent  PATIENT GOALS  to walk better and be as active as possible. Also to get in/out of cars better.   OBJECTIVE: (objective measures completed at initial evaluation unless otherwise dated)   DIAGNOSTIC FINDINGS: IMPRESSION: 1. Severe lumbar levoscoliosis with widespread advanced disc and facet degeneration. 2. Mild spinal stenosis at L4-5. 3. Moderate neural foraminal stenosis on the right at L3-4 and on the left at L5-S1. 4. Moderate right lateral recess stenosis at L2-3.      LOWER EXTREMITY MMT:    MMT (evaluation)  Right Eval Left Eval  Hip flexion 4 4  Hip extension 4 4  Hip abduction 4 4  Hip adduction 4 4  Hip internal rotation 4 4  Hip external rotation 4 4  Knee flexion 4 4  Knee extension 4 4  Ankle dorsiflexion 4 4  Ankle plantarflexion 4 4  Ankle inversion 4 4  Ankle eversion 4 4  (Blank rows = not tested)     FUNCTIONAL TESTS (EVALUATION):  5 times sit to stand: 14 sec without UE support Timed up and go (TUG): 15.67 sec with RW 10 meter walk test: 10.67 sec = 0.94 m/s using RW Berg Balance Scale: 48/56- see flowsheet for details  PATIENT SURVEYS:  FOTO : 49  TODAY'S TREATMENT  10/14/21 TherEx  Ball roll outs x 10 each direction (3 way) Sit to stand 10x  GTB abduction 15x GTB row 15x    NMR:   Standing with CGA next to support surface:  Airex balance beam:  -lateral stepping 4x length of // bars -tandem walking 4x length of //bars'   Airex pad throw balls into hoop for pertubation's x6 minutes   Lateral step into warrior pose and return to stance with clap 10x; each side.  Large step and clap, return to stance (step over half foam roller)  [place] 2x length with UE support; max cueing for sequencing.   PATIENT EDUCATION: Education details: need to monitor BP closely  Person educated: Patient Education method: Explanation, Demonstration, Tactile cues, and Verbal cues Education comprehension: verbalized understanding, returned demonstration, verbal cues required, tactile cues required, and needs further education   HOME EXERCISE PROGRAM: PWR! Basic 4 seated x 10 ea, handout provided: addended to not perform seated PRW! Rock exercise and added the below to program on 09/25/21  Access Code: [email or web address] URL: [email or web address] Date: 09/25/2021 Prepared by: Rivka Barbara  Exercises - Standing Romberg to 3/4 Tandem Stance  - 1 x daily - 7 x weekly - 2 sets - 30 seocnd hold - Standing March with Counter Support  - 1 x daily - 7 x weekly - 2 sets - 10 reps - Modified Single Leg Balance on Step  - 1 x daily - 7 x weekly - 2 sets - 30 second hold    GOALS: Goals reviewed with patient? Yes  SHORT TERM GOALS: Target date: 10/02/2021  Pt will be independent with initial HEP in order to improve strength and balance in order to decrease fall risk and improve function at home and work.  Baseline: 08/21/2021-No formal HEP in place Goal status: INITIAL    LONG TERM GOALS: Target date: 9/14//2023  Pt will improve BERG by at least 3 points in order to demonstrate clinically significant improvement in balance.    Baseline: 08/21/2021=  48/56 Goal status: INITIAL  2.  Pt will be independent with final HEP in order to improve strength and balance in order to decrease  fall risk and improve function at home and work.  Baseline: No HEP in place Goal status: INITIAL  3.  Pt will decrease 5TSTS by at least 4 seconds in order to demonstrate clinically significant improvement in LE strength. Baseline: 08/21/2021= 19.01 sec without UE support Goal status: INITIAL  4.  Pt will decrease TUG to below 14 seconds/decrease in order to demonstrate decreased fall risk. Baseline:  08/21/2021=15.67 with RW Goal status: INITIAL  5.  Pt will improve FOTO to target score of 56 to display perceived improvements in ability to complete ADL's.  Baseline: 08/21/2021=49 Goal status: INITIAL  6.  Pt will increase 10MWT by at least 0.15 m/s in order to demonstrate clinically significant improvement in community ambulation.   Baseline: 08/22/2021=0.94 m/s Goal status: INITIAL  ASSESSMENT:  CLINICAL IMPRESSION:  Patient presents with excellent motivation. He does require use of heat pad between interventions to keep back pain within functional levels for activities. Patient is challenged with coordination and sequencing of task indicating area of continued focus. Education on LSVT BIG and LOUD as well as PWR program provided.  Will continue to benefit from skilled therapy in order to address remaining deficits in order to improve overall QoL.    OBJECTIVE IMPAIRMENTS Abnormal gait, decreased activity tolerance, decreased balance, decreased coordination, decreased endurance, decreased knowledge of use of DME, decreased mobility, difficulty walking, decreased ROM, decreased strength, hypomobility, and impaired flexibility.   ACTIVITY LIMITATIONS carrying, lifting, bending, standing, squatting, sleeping, stairs, transfers, continence, bathing, toileting, and dressing  PARTICIPATION LIMITATIONS: cleaning, laundry, medication management, driving,  shopping, community activity, and yard work  PERSONAL FACTORS Age are also affecting patient's functional outcome.   REHAB POTENTIAL: Good  CLINICAL DECISION MAKING: Stable/uncomplicated  EVALUATION COMPLEXITY: Low  PLAN: PT FREQUENCY: 2x/week  PT DURATION: 12 weeks  PLANNED INTERVENTIONS: Therapeutic exercises, Therapeutic activity, Neuromuscular re-education, Balance training, Gait training, Patient/Family education, Joint mobilization, Stair training, Vestibular training, Canalith repositioning, DME instructions, Dry Needling, Electrical stimulation, Spinal manipulation, and Spinal mobilization  PLAN FOR NEXT SESSION: continue balance training, monitor hypotension and orthostatics, ROM for flexibility,    Janna Arch PT  10/14/21, 4:46 PM

## 2021-10-13 NOTE — Progress Notes (Signed)
Subjective:   Joseph Arroyo is a 81 y.o. male who presents for Medicare Annual/Subsequent preventive examination.  Review of Systems    Per HPI unless specifically indicated below Cardiac Risk Factors include: advanced age (>49mn, >>27women);male gender          Objective:    Today's Vitals   10/13/21 1529 10/13/21 1543  BP: (!) 114/53   Pulse: (!) 57   Resp: 18   Temp: 97.6 F (36.4 C)   TempSrc: Oral   SpO2: 98%   Weight: 132 lb 6.4 oz (60.1 kg)   Height: 5' 3.5" (1.613 m)   PainSc: 4  4   PainLoc: Back    Body mass index is 23.09 kg/m.     07/08/2021    9:36 AM 06/03/2021    8:25 AM 03/24/2021   11:15 AM 03/09/2021    2:39 PM 09/19/2019    2:17 PM 09/06/2018    9:42 AM 08/24/2017    2:34 PM  Advanced Directives  Does Patient Have a Medical Advance Directive? Yes Yes Yes No Yes Yes Yes  Type of AScientist, physiologicalof AFair PlayLiving will  HBeurys LakeLiving will Living will;Healthcare Power of [place]upertLiving will  Copy of HDoverin Chart?     No - copy requested No - copy requested No - copy requested    Current Medications (verified) Outpatient Encounter Medications as of 10/13/2021  Medication Sig   acetaminophen (TYLENOL) 500 MG tablet Take 1,000 mg by mouth every 8 (eight) hours as needed.   ALPRAZolam (XANAX) 0.25 MG tablet Take 1 tablet (0.25 mg total) by mouth 3 (three) times daily as needed for anxiety.   B Complex Vitamins (VITAMIN B COMPLEX PO) Take 1 tablet by mouth daily.   Capsicum-Garlic 2202-542MG CAPS Take 200-300 mg by mouth.   carbidopa-levodopa (SINEMET IR) 25-100 MG tablet Take 2.5 tablets by mouth 3 (three) times daily.   cloNIDine (CATAPRES) 0.1 MG tablet 1/2 tablet twice daily   gabapentin (NEURONTIN) 100 MG capsule 2 CAPSULES twice daily (Patient taking differently: Take 100 mg by mouth. 1 in AM and noon and 2 at night)   HAWTHORNE BERRY PO Take by  mouth daily.   L-Methylfolate 15 MG TABS Take 1 tablet (15 mg total) by mouth daily.   Lutein 10 MG TABS Take 1 tablet by mouth 3 (three) times daily. 1 daily   PARoxetine (PAXIL) 20 MG tablet TAKE 1 TABLET BY MOUTH EVERY DAY (WITH THE 40 MG TABLET FOR 60 MG TOTAL)   PARoxetine (PAXIL) 40 MG tablet TAKE 1 TABLET BY MOUTH EVERY DAY (WITH THE 20 MG TABLET FOR 60 MG TOTAL)   pramipexole (MIRAPEX) 0.125 MG tablet Take 0.125 mg by mouth 2 (two) times daily. 1 tab HS   PSYLLIUM HUSK PO Take by mouth.   Saw Palmetto, Serenoa repens, 1000 MG CAPS Take 2 capsules by mouth daily.   clotrimazole-betamethasone (LOTRISONE) cream Apply 1-2 times a day for worsening flare dry skin dermatitis of toes/feet, may re-use daily up to 1 week as needed. (Patient not taking: Reported on 10/13/2021)   Magnesium 200 MG TABS Take 200 mg by mouth daily. (Patient not taking: Reported on 10/13/2021)   [DISCONTINUED] omega-3 acid ethyl esters (LOVAZA) 1 g capsule Take 1 capsule by mouth daily. (Patient not taking: Reported on 10/13/2021)   No facility-administered encounter medications on file as of 10/13/2021.  Allergies (verified) Prednisone and Wheat bran   History: Past Medical History:  Diagnosis Date   Allergic rhinitis due to pollen 11/21/2007   Brachial neuritis or radiculitis NOS    Cervicalgia    Concussion    age 11 - s/p accident   Costal chondritis    Depression    Essential and other specified forms of tremor    GERD (gastroesophageal reflux disease)    in past   Lumbago    Occlusion and stenosis of carotid artery without mention of cerebral infarction    Seizures [facility])    age 89 - after concussion   Past Surgical History:  Procedure Laterality Date   CATARACT EXTRACTION Right 07/18/14   CATARACT EXTRACTION W/PHACO Left 08/01/2014   Procedure: CATARACT EXTRACTION PHACO AND INTRAOCULAR LENS PLACEMENT ([place]);  Surgeon: Leandrew Koyanagi, MD;  Location: [place];  Service: Ophthalmology;   Laterality: Left;  IVA TOPICAL   COLONOSCOPY  2008   OTHER SURGICAL HISTORY  2002   ear surgery   STAPEDES SURGERY Right 2002   Family History  Problem Relation Age of Onset   Heart failure Mother        enlarged heart    Asthma Father    Social History   Socioeconomic History   Marital status: Married    Spouse name: [name] [place]   Number of children: 2   Years of education: 12   Highest education level: Master's degree (e.g., MA, MS, MEng, MEd, MSW, MBA)  Occupational History   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[place])    Employer: RETIRED  Tobacco Use   Smoking status: Never   Smokeless tobacco: Never  Vaping Use   Vaping Use: Never used  Substance and Sexual Activity   Alcohol use: No    Alcohol/week: 0.0 standard drinks of alcohol   Drug use: No   Sexual activity: Not Currently  Other Topics Concern   Not on file  Social History Narrative   Married to [place], has 2 children, has grandchildren   Right handed   Master's plus   He is born in [place], heritage is [place] (Father's side), [place] (Mother's side)      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    Social Determinants of Health   Financial Resource Strain: Low Risk  (10/13/2021)   Overall Financial Resource Strain (CARDIA)    Difficulty of Paying Living Expenses: Not hard at all  Food Insecurity: No Food Insecurity (10/13/2021)   Hunger Vital Sign    Worried About Running Out of Food in the Last Year: Never true    [place] in the Last Year: Never true  Transportation Needs: No Transportation Needs (10/13/2021)   PRAPARE - Hydrologist (Medical): No    Lack of Transportation (Non-Medical): No  Physical Activity: Insufficiently Active (10/13/2021)   Exercise Vital Sign    Days of Exercise per Week: 3 days    Minutes of Exercise per Session: 30 min  Stress: No Stress Concern Present (10/13/2021)   [place]     Feeling of Stress : Not at all  Social Connections: [place] (10/13/2021)   Social Connection and Isolation Panel [NHANES]    Frequency of Communication with Friends and Family: More than three times a week    Frequency of Social Gatherings with Friends and Family: Never    Attends Religious Services: More than 4 times per year    Active  Member of Clubs or Organizations: Yes    Attends Music therapist: 1 to 4 times per year    Marital Status: Married    Tobacco Counseling Counseling given: Not Answered   Clinical Intake:  Pre-visit preparation completed: No  Pain : 0-10 Pain Score: 4  Pain Type: Chronic pain Pain Location: Back Pain Orientation: Other (Comment) (lower) Pain Descriptors / Indicators: Aching, Constant Pain Onset: More than a month ago     Nutritional Status: BMI 25 -29 Overweight Diabetes: No  How often do you need to have someone help you when you read instructions, pamphlets, or other written materials from your doctor or pharmacy?: 1 - Never  Diabetic? Interpreter Needed?: No  Information entered by :: Donnie Mesa, CMA   Activities of Daily Living    10/13/2021    3:52 PM  In your present state of health, do you have any difficulty performing the following activities:  Hearing? 1  Vision? 1  Difficulty concentrating or making decisions? 0  Walking or climbing stairs? 1  Dressing or bathing? 1  Doing errands, shopping? 1    Patient Care Team: Olin Hauser, DO as PCP - General (Family Medicine) Christene Lye, MD (General Surgery) Star Age, MD as Attending Physician (Neurology) [name], PA-C as Physician Assistant (Physician Assistant) Kem Parkinson, MD as Consulting Physician (Ophthalmology)  Indicate any recent Medical Services you may have received from other than [facility] providers in the past year (date may be approximate). Pt seen in the ED on 03/09/2021 for a fall and injury  to his lower back.    Assessment:   This is a routine wellness examination for Karlis.  Hearing/Vision screen Blurry vision. [facility] Exam done [facility]. The pt will call and schedule another appointment for worsening vision issues. Right Eye: 20/80 Left Eye: 20/40 Both Eyes: 20/40  Mild Hearing loss that he associate with age.  Dietary issues and exercise activities discussed: Current Exercise Habits: Structured exercise class;The patient does not participate in regular exercise at present, Type of exercise: walking;strength training/weights, Time (Minutes): 40, Frequency (Times/Week): 5, Weekly Exercise (Minutes/Week): 200, Intensity: Mild   Goals Addressed   None    Depression Screen    10/13/2021    3:34 PM 07/08/2021    9:36 AM 06/24/2021    2:34 PM 06/03/2021    8:25 AM 03/24/2021   11:13 AM 03/17/2021    3:48 PM 10/01/2020    1:28 PM  PHQ 2/9 Scores  PHQ - 2 Score 0 0 0 0 2 1 0  PHQ- 9 Score     '9 6 1    '$ Fall Risk    10/13/2021    3:42 PM 09/11/2021    1:49 PM 08/26/2021    9:55 AM 08/05/2021    2:05 PM 07/08/2021    9:36 AM  Fall Risk   Falls in the past year? 1 0 0 0 0  Number falls in past yr: 1   0   Injury with Fall? 0      Risk for fall due to : History of fall(s)   Impaired balance/gait;Impaired mobility   Follow up Falls evaluation completed   Falls evaluation completed;Education provided     FALL RISK PREVENTION PERTAINING TO THE HOME:  Any stairs in or around the home? Yes  If so, are there any without handrails? Yes  Home free of loose throw rugs in walkways, pet beds, electrical cords, etc? Yes  Adequate  lighting in your home to reduce risk of falls? Yes   ASSISTIVE DEVICES UTILIZED TO PREVENT FALLS:  Life alert? No  Use of a cane, walker or w/c? Yes  Grab bars in the bathroom? Yes  Shower chair or bench in shower? Yes  Elevated toilet seat or a handicapped toilet? Yes   TIMED UP AND GO:  Was the test performed? Yes .  Length of  time to ambulate 10 feet: 10 sec.   Gait slow and steady with assistive device  Cognitive Function:        10/13/2021    3:49 PM 09/19/2019    2:27 PM 09/06/2018    9:47 AM 08/24/2017    2:35 PM 07/13/2016   10:40 AM  6CIT Screen  What Year? 0 points 0 points 0 points 0 points 0 points  What month? 0 points 0 points 0 points 0 points 0 points  What time? 0 points 0 points 0 points 0 points 0 points  Count back from 20 0 points 0 points 0 points 0 points 0 points  Months in reverse 0 points 0 points 0 points 0 points 0 points  Repeat phrase 0 points 0 points 2 points 0 points 2 points  Total Score 0 points 0 points 2 points 0 points 2 points    Immunizations Immunization History  Administered Date(s) Administered   Pneumococcal Conjugate-13 07/13/2016   Pneumococcal Polysaccharide-23 01/26/2011    TDAP status: Up to date  Flu Vaccine status: Up to date  Pneumococcal vaccine status: Up to date  COVID 19 Vaccine: declined  Qualifies for Shingles Vaccine? Yes   Zostavax completed No   Shingrix Completed?: No.    Education has been provided regarding the importance of this vaccine. Patient has been advised to call insurance company to determine out of pocket expense if they have not yet received this vaccine. Advised may also receive vaccine at local pharmacy or Health Dept. Verbalized acceptance and understanding.  Screening Tests Health Maintenance  Topic Date Due   Hepatitis C Screening  Never done   Zoster Vaccines- Shingrix (1 of 2) Never done   TETANUS/TDAP  03/02/2020   INFLUENZA VACCINE  09/30/2021   COVID-19 Vaccine (1) 10/29/2021 (Originally 01/28/1946)   Pneumonia Vaccine 75+ Years old  Completed   HPV VACCINES  Aged Out    Health Maintenance  Health Maintenance Due  Topic Date Due   Hepatitis C Screening  Never done   Zoster Vaccines- Shingrix (1 of 2) Never done   TETANUS/TDAP  03/02/2020   INFLUENZA VACCINE  09/30/2021    Colon screen: 2008, no  longer required   Lung Cancer Screening: (Low Dose CT Chest recommended if Age 32-80 years, 30 pack-year currently smoking OR have quit w/in 15years.) does not qualify.   Lung Cancer Screening Referral:  does not qualify  Additional Screening:  Hepatitis C Screening: does qualify, not done  Vision Screening: Recommended annual ophthalmology exams for early detection of glaucoma and other disorders of the eye. Is the patient up to date with their annual eye exam?  Yes  Who is the provider or what is the name of the office in which the patient attends annual eye exams?  If pt is not established with a provider, would they like to be referred to a provider to establish care? No .  No  Dental Screening: Recommended annual dental exams for proper oral hygiene  Community Resource Referral / Chronic Care Management: CRR required this visit?  No  CCM required this visit?  No      Plan:     I have personally reviewed and noted the following in the patient's chart:   Medical and social history Use of alcohol, tobacco or illicit drugs  Current medications and supplements including opioid prescriptions. Patient is currently taking opioid prescriptions. Information provided to patient regarding non-opioid alternatives. Patient advised to discuss non-opioid treatment plan with their provider. Functional ability and status Nutritional status Physical activity Advanced directives List of other physicians Hospitalizations, surgeries, and ER visits in previous 12 months Vitals Screenings to include cognitive, depression, and falls Referrals and appointments  In addition, I have reviewed and discussed with patient certain preventive protocols, quality metrics, and best practice recommendations. A written personalized care plan for preventive services as well as general preventive health recommendations were provided to patient.     Mr. Funes , Thank you for taking time to come for  your Medicare Wellness Visit. I appreciate your ongoing commitment to your health goals. Please review the following plan we discussed and let me know if I can assist you in the future.   These are the goals we discussed:  Goals      DIET - INCREASE WATER INTAKE     Recommend drinking at least 6-8 glasses of water a day      Increase water intake     Recommend increasing water intake to 4-6 glasses of water a day.     Patient Stated     09/19/2019, continue to stay fit        This is a list of the screening recommended for you and due dates:  Health Maintenance  Topic Date Due   Hepatitis C Screening: USPSTF Recommendation to screen - Ages 21-79 yo.  Never done   Zoster (Shingles) Vaccine (1 of 2) Never done   Tetanus Vaccine  03/02/2020   Flu Shot  09/30/2021   COVID-19 Vaccine (1) 10/29/2021*   Pneumonia Vaccine  Completed   HPV Vaccine  Aged Out  *Topic was postponed. The date shown is not the original due date.     Mr. Millea , Thank you for taking time to come for your Medicare Wellness Visit. I appreciate your ongoing commitment to your health goals. Please review the following plan we discussed and let me know if I can assist you in the future.   These are the goals we discussed:  Goals      DIET - INCREASE WATER INTAKE     Recommend drinking at least 6-8 glasses of water a day      Increase water intake     Recommend increasing water intake to 4-6 glasses of water a day.     Patient Stated     09/19/2019, continue to stay fit        This is a list of the screening recommended for you and due dates:  Health Maintenance  Topic Date Due   Hepatitis C Screening: USPSTF Recommendation to screen - Ages 58-79 yo.  Never done   Zoster (Shingles) Vaccine (1 of 2) Never done   Tetanus Vaccine  03/02/2020   Flu Shot  09/30/2021   COVID-19 Vaccine (1) 10/29/2021*   Pneumonia Vaccine  Completed   HPV Vaccine  Aged Out  *Topic was postponed. The date shown is not  the original due date.    Wilson Singer, CMA   10/13/2021   Nurse Notes: abdominal wall 40 minute face-to-face visit

## 2021-10-13 NOTE — Therapy (Signed)
OUTPATIENT SPEECH LANGUAGE PATHOLOGY PARKINSON'S Treatment DISCHARGE SUMMARY    Patient Name: Joseph Arroyo MRN: [id number] DOB:1940-06-16, 81 y.o., male Today's Date: 10/13/2021  PCP: Dr Nobie Putnam REFERRING PROVIDER: Dr Lavena Bullion   End of Session - 10/13/21 1056     Visit Number 13    Number of Visits 25    Date for SLP Re-Evaluation 11/12/21    Authorization Type [employer] Medicare/HMO PPO    Progress Note Due on Visit 10    SLP Start Time 1500    SLP Stop Time  1600    SLP Time Calculation (min) 60 min    Activity Tolerance Patient tolerated treatment well              Past Medical History:  Diagnosis Date   Allergic rhinitis due to pollen 11/21/2007   Brachial neuritis or radiculitis NOS    Cervicalgia    Concussion    age 30 - s/p accident   Costal chondritis    Depression    Essential and other specified forms of tremor    GERD (gastroesophageal reflux disease)    in past   Lumbago    Occlusion and stenosis of carotid artery without mention of cerebral infarction    Seizures [facility])    age 81 - after concussion   Past Surgical History:  Procedure Laterality Date   CATARACT EXTRACTION Right 07/18/14   CATARACT EXTRACTION W/PHACO Left 08/01/2014   Procedure: CATARACT EXTRACTION PHACO AND INTRAOCULAR LENS PLACEMENT ([place]);  Surgeon: Leandrew Koyanagi, MD;  Location: [place];  Service: Ophthalmology;  Laterality: Left;  IVA TOPICAL   COLONOSCOPY  2008   OTHER SURGICAL HISTORY  2002   ear surgery   STAPEDES SURGERY Right 2002   Patient Active Problem List   Diagnosis Date Noted   Dysphagia, pharyngeal phase 09/17/2021   Cervicalgia 08/05/2021   Spondylosis without myelopathy or radiculopathy, lumbosacral region 06/24/2021   Lumbosacral facet hypertrophy 06/03/2021   Lumbar central spinal stenosis, w/o neurogenic claudication (L4-5) 06/03/2021   Lumbar lateral recess stenosis (Bilateral: L2-3, L4-5) (Right: L3-4) 06/03/2021    Lumbosacral foraminal stenosis (Bilateral: L2-3, L3-4) (Left: L5-S1) 06/03/2021   Lumbar nerve root impingement (Right: L3 at L2-3 & L3-4) 06/03/2021   Ligamentum flavum hypertrophy (L3-4, L4-5) 06/03/2021   Abnormal MRI, lumbar spine (04/23/2021) 04/24/2021   Long term prescription benzodiazepine use (alprazolam) (Xanax) 03/24/2021   Grade 1 Retrolisthesis of L2/L3 (5 mm) and L3/L4 (3 mm) 03/24/2021   Levoscoliosis of lumbar spine (L3-4 apex) 03/24/2021   DDD (degenerative disc disease), lumbosacral 03/24/2021   Lumbosacral facet arthropathy (Left: L3-4, L4-5, and L5-S1) 03/24/2021   Tricompartment osteoarthritis of knee (Left) 03/24/2021   Baker cyst (Left) 03/24/2021   Chronic low back pain (1ry area of Pain) (Bilateral) (R>L) w/o sciatica 03/24/2021   Lumbar facet syndrome (Bilateral) 03/24/2021   Chronic lower extremity pain (2ry area of Pain) (Bilateral) (L>R) 03/24/2021   Lumbosacral radiculitis/sensory radiculopathy at L2 (Bilateral) 03/24/2021   Lumbosacral radiculitis/sensory radiculopathy at L3 (Bilateral) 03/24/2021   Chronic pain syndrome 03/23/2021   Pharmacologic therapy 03/23/2021   Disorder of skeletal system 03/23/2021   Problems influencing health status 03/23/2021   PAD (peripheral artery disease) ([place]) 10/01/2020   GAD (generalized anxiety disorder) 01/13/2018   MDD (major depressive disorder) 83/41/9622   Umbilical hernia 29/79/8921   Chronic knee pain (Left) 08/11/2017   Derangement of medial meniscus, posterior horn (Left) 08/11/2017   Vitamin D insufficiency 03/05/2016   BPH without obstruction/lower urinary  tract symptoms 07/08/2015   Chronic fatigue 10/23/2014   Parkinson's disease ([place]) 09/20/2014   Major depression, recurrent, full remission ([place]) 07/26/2013   Unsteady gait 07/26/2013   Orthostatic hypotension 07/26/2013   Depression 07/26/2013   Anxiety 06/23/2013   Chronic anxiety 06/23/2013   Tremor 05/18/2013   Bilateral carotid artery stenosis  08/30/2012    ONSET DATE: 05/28/2015; referral date 08/11/2021  REFERRING DIAG: G20 (ICD-10-CM) - Parkinson's disease  THERAPY DIAG:  Dysarthria and anarthria  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 Yes: NPRS scale: 2/10 Pain location: back Pain description: lower; right; left Aggravating factors: sitting Relieving factors: under care of pain doctor; receiving epidurals  PATIENT GOALS pt's wife states her goal as "so other people can understand him"  OBJECTIVE:   TODAY'S TREATMENT:  Pt continues to experience difficulty with carryover of therapy activities, thus his progress is very slow and his speech intelligibility remains largely unchanged. Today, when going over practiced Bible Study, his speech is c/b rast rate, imprecaise articulation, decreased intonation and low vocal quality resulting in ~ 60% speech intelligibility at the simple conversation level. Extensive time spent discussing new baseline speech intelligibility d/t multiple comorbidilities (deficits in memory, dyskensias, past courses of ST services).   SLP further facilitated session by having pt and his wife completed the Communication Effectiveness Scale. Pt reports 15 out of 32.  Which is an improvement from 11 out of 32 during initial evaluation.   PATIENT EDUCATION: Education details: guarded prognosis, minimal progress towards goals Person educated: Patient and Spouse Education method: Explanation, Demonstration, and Verbal cues Education comprehension: verbalized understanding and needs further education       GOALS: Goals reviewed with patient? Yes  SHORT TERM GOALS: Target date: 10  therapy sessions  The patient produce "ah", High/Lows, and Functional Phrases at average loudness >/= 80 dB and with loud, good quality voice with moderate cues.             Baseline: maximal cues Goal status: MET  2.  The patient will complete reading drills (words/phrases, sentences) at average >/= 75 dB and with loud, good quality voice with moderate cues.  Baseline: maximal cues Goal status: MET  3.   The patient will participate in 5-8 minutes conversation, maintaining average loudness of 75 dB and loud, good quality voice with min cues.  Baseline: maximal cues Goal status: Not Met  4.  The patient will complete an instrumental evaluation (MBSS) within 3 weeks in order to evaluate swallowing safety. Baseline: last MBSS 2017 Goal status: Met   LONG TERM GOALS: Target date: 11/12/2021    The patient will participate in 15-20 minutes conversation, maintaining average loudness of 75 dB and loud, good quality voice. Baseline: 60dB Goal status: Not Met  2.   Patient will report improved communication effectiveness as measured by Communicative Effectiveness Survey.   Baseline: 11/32 Goal status: Marginally improved  3.  Pt will consume least restrictive diet with no s/s of respiratory decline.  Baseline: new goal Goal status: Met   ASSESSMENT:  CLINICAL IMPRESSION:  Pt has made marginal progress over the course of skilled ST sessions. This is likely related to multiple comorbidities including cognitive deficits, progressive nature of dx as well as multiple  previous courses of skilled ST intervention. Despite pt's motivation and eagerness, he has experienced little carryover of therapeutic concepts. Unfortunately, pt appears at his new baseline for speech intelligibility. Extensive education has been completed with pt and his wife. At this time, pt is no longer benefiting from skilled ST intervention and is appropriate for discharge from skilled ST intervention.    Tykel Badie B.  Rutherford Nail, M.S., CCC-SLP, Mining engineer Certified Brain Injury [place]  [place] Office [phone] Ascom [phone] Fax [phone]

## 2021-10-13 NOTE — Patient Instructions (Signed)
Health Maintenance, Male Adopting a healthy lifestyle and getting preventive care are important in promoting health and wellness. Ask your health care provider about: The right schedule for you to have regular tests and exams. Things you can do on your own to prevent diseases and keep yourself healthy. What should I know about diet, weight, and exercise? Eat a healthy diet  Eat a diet that includes plenty of vegetables, fruits, low-fat dairy products, and lean protein. Do not eat a lot of foods that are high in solid fats, added sugars, or sodium. Maintain a healthy weight Body mass index (BMI) is a measurement that can be used to identify possible weight problems. It estimates body fat based on height and weight. Your health care provider can help determine your BMI and help you achieve or maintain a healthy weight. Get regular exercise Get regular exercise. This is one of the most important things you can do for your health. Most adults should: Exercise for at least 150 minutes each week. The exercise should increase your heart rate and make you sweat (moderate-intensity exercise). Do strengthening exercises at least twice a week. This is in addition to the moderate-intensity exercise. Spend less time sitting. Even light physical activity can be beneficial. Watch cholesterol and blood lipids Have your blood tested for lipids and cholesterol at 80 years of age, then have this test every 5 years. You may need to have your cholesterol levels checked more often if: Your lipid or cholesterol levels are high. You are older than 80 years of age. You are at high risk for heart disease. What should I know about cancer screening? Many types of cancers can be detected early and may often be prevented. Depending on your health history and family history, you may need to have cancer screening at various ages. This may include screening for: Colorectal cancer. Prostate cancer. Skin cancer. Lung  cancer. What should I know about heart disease, diabetes, and high blood pressure? Blood pressure and heart disease High blood pressure causes heart disease and increases the risk of stroke. This is more likely to develop in people who have high blood pressure readings or are overweight. Talk with your health care provider about your target blood pressure readings. Have your blood pressure checked: Every 3-5 years if you are 18-39 years of age. Every year if you are 40 years old or older. If you are between the ages of 65 and 75 and are a current or former smoker, ask your health care provider if you should have a one-time screening for abdominal aortic aneurysm (AAA). Diabetes Have regular diabetes screenings. This checks your fasting blood sugar level. Have the screening done: Once every three years after age 45 if you are at a normal weight and have a low risk for diabetes. More often and at a younger age if you are overweight or have a high risk for diabetes. What should I know about preventing infection? Hepatitis B If you have a higher risk for hepatitis B, you should be screened for this virus. Talk with your health care provider to find out if you are at risk for hepatitis B infection. Hepatitis C Blood testing is recommended for: Everyone born from 1945 through 1965. Anyone with known risk factors for hepatitis C. Sexually transmitted infections (STIs) You should be screened each year for STIs, including gonorrhea and chlamydia, if: You are sexually active and are younger than 80 years of age. You are older than 80 years of age and your   health care provider tells you that you are at risk for this type of infection. Your sexual activity has changed since you were last screened, and you are at increased risk for chlamydia or gonorrhea. Ask your health care provider if you are at risk. Ask your health care provider about whether you are at high risk for HIV. Your health care provider  may recommend a prescription medicine to help prevent HIV infection. If you choose to take medicine to prevent HIV, you should first get tested for HIV. You should then be tested every 3 months for as long as you are taking the medicine. Follow these instructions at home: Alcohol use Do not drink alcohol if your health care provider tells you not to drink. If you drink alcohol: Limit how much you have to 0-2 drinks a day. Know how much alcohol is in your drink. In the [place] one drink equals one 12 oz bottle of beer (355 mL), one 5 oz glass of wine (148 mL), or one 1 oz glass of hard liquor (44 mL). Lifestyle Do not use any products that contain nicotine or tobacco. These products include cigarettes, chewing tobacco, and vaping devices, such as e-cigarettes. If you need help quitting, ask your health care provider. Do not use street drugs. Do not share needles. Ask your health care provider for help if you need support or information about quitting drugs. General instructions Schedule regular health, dental, and eye exams. Stay current with your vaccines. Tell your health care provider if: You often feel depressed. You have ever been abused or do not feel safe at home. Summary Adopting a healthy lifestyle and getting preventive care are important in promoting health and wellness. Follow your health care provider's instructions about healthy diet, exercising, and getting tested or screened for diseases. Follow your health care provider's instructions on monitoring your cholesterol and blood pressure. This information is not intended to replace advice given to you by your health care provider. Make sure you discuss any questions you have with your health care provider. Document Revised: 07/08/2020 Document Reviewed: 07/08/2020 Elsevier Patient Education  2023 [employer] Inc[employer]  

## 2021-10-14 ENCOUNTER — Ambulatory Visit: Payer: Medicare HMO

## 2021-10-14 ENCOUNTER — Encounter: Payer: Medicare HMO | Admitting: Speech Pathology

## 2021-10-14 ENCOUNTER — Ambulatory Visit: Payer: Medicare HMO | Admitting: Speech Pathology

## 2021-10-14 DIAGNOSIS — G20 Parkinson's disease: Secondary | ICD-10-CM | POA: Diagnosis not present

## 2021-10-14 DIAGNOSIS — R2681 Unsteadiness on feet: Secondary | ICD-10-CM

## 2021-10-14 DIAGNOSIS — M6281 Muscle weakness (generalized): Secondary | ICD-10-CM

## 2021-10-14 DIAGNOSIS — R471 Dysarthria and anarthria: Secondary | ICD-10-CM | POA: Diagnosis not present

## 2021-10-14 DIAGNOSIS — R2689 Other abnormalities of gait and mobility: Secondary | ICD-10-CM | POA: Diagnosis not present

## 2021-10-14 DIAGNOSIS — R490 Dysphonia: Secondary | ICD-10-CM | POA: Diagnosis not present

## 2021-10-14 DIAGNOSIS — R269 Unspecified abnormalities of gait and mobility: Secondary | ICD-10-CM

## 2021-10-14 DIAGNOSIS — R262 Difficulty in walking, not elsewhere classified: Secondary | ICD-10-CM

## 2021-10-14 DIAGNOSIS — R278 Other lack of coordination: Secondary | ICD-10-CM | POA: Diagnosis not present

## 2021-10-15 NOTE — Therapy (Signed)
OUTPATIENT OCCUPATIONAL THERAPY NEURO TREATMENT  Patient Name: Joseph Arroyo MRN: [id number] DOB:[birth year], 81 y.o., male Today's Date: 10/15/2021  PCP: Dr. Nobie Putnam REFERRING PROVIDER: Dr. Lavena Bullion   OT End of Session - 10/15/21 0956     Visit Number 8    Number of Visits 24    Date for OT Re-Evaluation 11/11/21    Authorization Time Period Reporting period beginning 08/19/21    OT Start Time 1515    OT Stop Time 1600    OT Time Calculation (min) 45 min    Equipment Utilized During Treatment RW    Activity Tolerance Patient tolerated treatment well    Behavior During Therapy [facility] for tasks assessed/performed              Past Medical History:  Diagnosis Date   Allergic rhinitis due to pollen 11/21/2007   Brachial neuritis or radiculitis NOS    Cervicalgia    Concussion    age 81 - s/p accident   Costal chondritis    Depression    Essential and other specified forms of tremor    GERD (gastroesophageal reflux disease)    in past   Lumbago    Occlusion and stenosis of carotid artery without mention of cerebral infarction    Seizures [facility])    age 17 - after concussion   Past Surgical History:  Procedure Laterality Date   CATARACT EXTRACTION Right 07/18/14   CATARACT EXTRACTION W/PHACO Left 08/01/2014   Procedure: CATARACT EXTRACTION PHACO AND INTRAOCULAR LENS PLACEMENT ([place]);  Surgeon: Leandrew Koyanagi, MD;  Location: [place];  Service: Ophthalmology;  Laterality: Left;  IVA TOPICAL   COLONOSCOPY  2008   OTHER SURGICAL HISTORY  2002   ear surgery   STAPEDES SURGERY Right 2002   Patient Active Problem List   Diagnosis Date Noted   Dysphagia, pharyngeal phase 09/17/2021   Cervicalgia 08/05/2021   Spondylosis without myelopathy or radiculopathy, lumbosacral region 06/24/2021   Lumbosacral facet hypertrophy 06/03/2021   Lumbar central spinal stenosis, w/o neurogenic claudication (L4-5) 06/03/2021   Lumbar lateral recess  stenosis (Bilateral: L2-3, L4-5) (Right: L3-4) 06/03/2021   Lumbosacral foraminal stenosis (Bilateral: L2-3, L3-4) (Left: L5-S1) 06/03/2021   Lumbar nerve root impingement (Right: L3 at L2-3 & L3-4) 06/03/2021   Ligamentum flavum hypertrophy (L3-4, L4-5) 06/03/2021   Abnormal MRI, lumbar spine (04/23/2021) 04/24/2021   Long term prescription benzodiazepine use (alprazolam) (Xanax) 03/24/2021   Grade 1 Retrolisthesis of L2/L3 (5 mm) and L3/L4 (3 mm) 03/24/2021   Levoscoliosis of lumbar spine (L3-4 apex) 03/24/2021   DDD (degenerative disc disease), lumbosacral 03/24/2021   Lumbosacral facet arthropathy (Left: L3-4, L4-5, and L5-S1) 03/24/2021   Tricompartment osteoarthritis of knee (Left) 03/24/2021   Baker cyst (Left) 03/24/2021   Chronic low back pain (1ry area of Pain) (Bilateral) (R>L) w/o sciatica 03/24/2021   Lumbar facet syndrome (Bilateral) 03/24/2021   Chronic lower extremity pain (2ry area of Pain) (Bilateral) (L>R) 03/24/2021   Lumbosacral radiculitis/sensory radiculopathy at L2 (Bilateral) 03/24/2021   Lumbosacral radiculitis/sensory radiculopathy at L3 (Bilateral) 03/24/2021   Chronic pain syndrome 03/23/2021   Pharmacologic therapy 03/23/2021   Disorder of skeletal system 03/23/2021   Problems influencing health status 03/23/2021   PAD (peripheral artery disease) ([place]) 10/01/2020   GAD (generalized anxiety disorder) 01/13/2018   MDD (major depressive disorder) 19/62/2297   Umbilical hernia 98/92/1194   Chronic knee pain (Left) 08/11/2017   Derangement of medial meniscus, posterior horn (Left) 08/11/2017   Vitamin D insufficiency 03/05/2016  BPH without obstruction/lower urinary tract symptoms 07/08/2015   Chronic fatigue 10/23/2014   Parkinson's disease ([place]) 09/20/2014   Major depression, recurrent, full remission ([place]) 07/26/2013   Unsteady gait 07/26/2013   Orthostatic hypotension 07/26/2013   Depression 07/26/2013   Anxiety 06/23/2013   Chronic anxiety 06/23/2013    Tremor 05/18/2013   Bilateral carotid artery stenosis 08/30/2012    ONSET DATE: 08/11/21 date of referral.  PD symptoms since 2015.  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  Wife present for tx.   PERTINENT HISTORY: Per chart, pt with hx of Parkinson's disease ([place]); Chronic knee pain (Left); Derangement of medial meniscus, posterior horn (Left); PAD (peripheral artery disease) ([place]); Chronic pain syndrome; DDD (degenerative disc disease) lumbosacral; osteoarthritis of knee (Left); Chronic low back pain, Lumbar facet syndrome (Bilateral); Chronic lower extremity pain. PRECAUTIONS: Fall  WEIGHT BEARING RESTRICTIONS No  PAIN:  Are you having pain? Yes: NPRS scale: 3/10 Pain location: low back Pain description: achy Aggravating factors: N/A, constant Relieving factors: rest, repositioning  PATIENT GOALS : Increase efficiency with daily tasks, increase [facility] skills   OBJECTIVE:   TODAY'S TREATMENT:  Therapeutic Exercise: Facilitated hand strengthening with use of hand gripper set at moderate resistance with 1 green band; pt completed 3 sets 10 grip squeezes R/L. Facilitated pinch strengthening with use of therapy resistant clothespins to target lateral and 3 point pinch of L hand.  Able to pinch all colors in the R hand, all but black in the L hand d/t L thumb pain and weakness.  Performed 2 trials for each pinch type noted above, working to increase bilat hand strength for ADLs.  Performed 5 min on the UBE with moderate resistance, 2 min standing and 3 min sitting with good tolerance, working to increase activity tolerance for ADLs; cues for posture and pacing.  Self Care: Reviewed fall prevention strategies with ADLs, including routines for  bathing/toileting/getting up at night to use the bathroom.  Reinforced safe walker positioning during ADLs, possibly adding a vertical grab bar by the toilet/shower as pt feels there is limited space in the bathroom for his walker (pt/spouse receptive).  Reinforced keeping pathways clear, turning lights on when up at night, making slow positional changes, wearing non-skid footwear, and removing throw rugs.  Pt/spouse report good compliance with above.    PATIENT EDUCATION: Education details: fall prevention Person educated: Patient and Spouse Education method: Explanation and Verbal cues Education comprehension: verbalized understanding   HOME EXERCISE PROGRAM:  BUE strengthening with yellow/red theraband   GOALS: Goals reviewed with patient? Yes  SHORT TERM GOALS: Target date: 09/30/21  Pt will be indep to perform HEP for increasing strength and coordination in B hands.  Baseline: HEP not yet initiated Goal status: INITIAL  2.  Pt will be indep to verbalize 3 fall prevention strategies to implement during ADLs.  Baseline: Not yet initiated Goal status: INITIAL   3 LONG TERM GOALS: 11/11/21  Pt will increase FOTO score by 3 or more points to indicate increased perceived functional performance with daily tasks.  Baseline: FOTO 60 Goal status: INITIAL  2.  Pt will be able to efficiently send short text messages to family/friends with modified techniques as needed (ie: use of stylus, voice to text) Baseline: Spouse sends texts for pt Goal status: INITIAL  3.  Pt will fill out a check for bill paying with 90% legibility with adapted pen as needed. Baseline: Pt reports inconsistent legibility Goal status: INITIAL  4.  Pt will increase efficiency with dressing tasks, donning clothes in 15 min or less. Baseline: Spouse reports it can take pt 20-30 min to dress in the morning Goal status: INITIAL    ASSESSMENT:  CLINICAL IMPRESSION:   Good tolerance to BUE exercises this date.   Pt/spouse receptive to all fall prevention strategies and pt/spouse seem to have good routines in place for ADL safety in the home.  Pt continues to work on improving  BUE strength and activity tolerance, and [place] skills in order to work towards improving engagement with, and maximizing independence with ADLs, and IADL tasks.     PERFORMANCE DEFICITS in functional skills including ADLs, IADLs, coordination, dexterity, ROM, strength, pain, flexibility, FMC, and GMC, cognitive skills including safety awareness.  IMPAIRMENTS are limiting patient from ADLs, IADLs, and leisure.   COMORBIDITIES has co-morbidities such as scoliosis, chronic back pain and leg pain  that affects occupational performance. Patient will benefit from skilled OT to address above impairments and improve overall function.  MODIFICATION OR ASSISTANCE TO COMPLETE EVALUATION: No modification of tasks or assist necessary to complete an evaluation.  OT OCCUPATIONAL PROFILE AND HISTORY: Problem focused assessment: Including review of records relating to presenting problem.  CLINICAL DECISION MAKING: Moderate - several treatment options, min-mod task modification necessary  REHAB POTENTIAL: Good  EVALUATION COMPLEXITY: Moderate    PLAN: OT FREQUENCY: 2x/week  OT DURATION: 12 weeks  PLANNED INTERVENTIONS: self care/ADL training, therapeutic exercise, therapeutic activity, neuromuscular re-education, manual therapy, passive range of motion, balance training, functional mobility training, moist heat, cryotherapy, patient/family education, cognitive remediation/compensation, energy conservation, coping strategies training, and DME and/or AE instructions  RECOMMENDED OTHER SERVICES: N/A  CONSULTED AND AGREED WITH PLAN OF CARE: Patient and family member/caregiver  PLAN FOR NEXT SESSION: See above   Leta Speller, MS, OTR/L  Darleene Cleaver, OT 10/15/2021, 9:57 AM

## 2021-10-16 ENCOUNTER — Encounter: Payer: Medicare HMO | Admitting: Speech Pathology

## 2021-10-16 ENCOUNTER — Encounter: Payer: Self-pay | Admitting: Physical Therapy

## 2021-10-16 ENCOUNTER — Ambulatory Visit: Payer: Medicare HMO | Admitting: Physical Therapy

## 2021-10-16 ENCOUNTER — Ambulatory Visit: Payer: Medicare HMO

## 2021-10-16 DIAGNOSIS — R262 Difficulty in walking, not elsewhere classified: Secondary | ICD-10-CM | POA: Diagnosis not present

## 2021-10-16 DIAGNOSIS — R471 Dysarthria and anarthria: Secondary | ICD-10-CM | POA: Diagnosis not present

## 2021-10-16 DIAGNOSIS — M6281 Muscle weakness (generalized): Secondary | ICD-10-CM | POA: Diagnosis not present

## 2021-10-16 DIAGNOSIS — R490 Dysphonia: Secondary | ICD-10-CM | POA: Diagnosis not present

## 2021-10-16 DIAGNOSIS — G20 Parkinson's disease: Secondary | ICD-10-CM | POA: Diagnosis not present

## 2021-10-16 DIAGNOSIS — R2681 Unsteadiness on feet: Secondary | ICD-10-CM | POA: Diagnosis not present

## 2021-10-16 DIAGNOSIS — R2689 Other abnormalities of gait and mobility: Secondary | ICD-10-CM | POA: Diagnosis not present

## 2021-10-16 DIAGNOSIS — R278 Other lack of coordination: Secondary | ICD-10-CM

## 2021-10-16 DIAGNOSIS — R269 Unspecified abnormalities of gait and mobility: Secondary | ICD-10-CM | POA: Diagnosis not present

## 2021-10-16 NOTE — Therapy (Signed)
OUTPATIENT OCCUPATIONAL THERAPY NEURO TREATMENT  Patient Name: Joseph Arroyo MRN: [id number] DOB:28-Sep-1940, 81 y.o., male Today's Date: 10/16/2021  PCP: Dr. Nobie Putnam REFERRING PROVIDER: Dr. Lavena Bullion   OT End of Session - 10/16/21 1154     Visit Number 9    Number of Visits 24    Date for OT Re-Evaluation 11/11/21    Authorization Time Period Reporting period beginning 08/19/21    OT Start Time 1145    OT Stop Time 93    OT Time Calculation (min) 45 min    Equipment Utilized During Treatment RW    Activity Tolerance Patient tolerated treatment well    Behavior During Therapy [facility] for tasks assessed/performed              Past Medical History:  Diagnosis Date   Allergic rhinitis due to pollen 11/21/2007   Brachial neuritis or radiculitis NOS    Cervicalgia    Concussion    age 41 - s/p accident   Costal chondritis    Depression    Essential and other specified forms of tremor    GERD (gastroesophageal reflux disease)    in past   Lumbago    Occlusion and stenosis of carotid artery without mention of cerebral infarction    Seizures [facility])    age 49 - after concussion   Past Surgical History:  Procedure Laterality Date   CATARACT EXTRACTION Right 07/18/14   CATARACT EXTRACTION W/PHACO Left 08/01/2014   Procedure: CATARACT EXTRACTION PHACO AND INTRAOCULAR LENS PLACEMENT ([place]);  Surgeon: Leandrew Koyanagi, MD;  Location: [place];  Service: Ophthalmology;  Laterality: Left;  IVA TOPICAL   COLONOSCOPY  2008   OTHER SURGICAL HISTORY  2002   ear surgery   STAPEDES SURGERY Right 2002   Patient Active Problem List   Diagnosis Date Noted   Dysphagia, pharyngeal phase 09/17/2021   Cervicalgia 08/05/2021   Spondylosis without myelopathy or radiculopathy, lumbosacral region 06/24/2021   Lumbosacral facet hypertrophy 06/03/2021   Lumbar central spinal stenosis, w/o neurogenic claudication (L4-5) 06/03/2021   Lumbar lateral recess  stenosis (Bilateral: L2-3, L4-5) (Right: L3-4) 06/03/2021   Lumbosacral foraminal stenosis (Bilateral: L2-3, L3-4) (Left: L5-S1) 06/03/2021   Lumbar nerve root impingement (Right: L3 at L2-3 & L3-4) 06/03/2021   Ligamentum flavum hypertrophy (L3-4, L4-5) 06/03/2021   Abnormal MRI, lumbar spine (04/23/2021) 04/24/2021   Long term prescription benzodiazepine use (alprazolam) (Xanax) 03/24/2021   Grade 1 Retrolisthesis of L2/L3 (5 mm) and L3/L4 (3 mm) 03/24/2021   Levoscoliosis of lumbar spine (L3-4 apex) 03/24/2021   DDD (degenerative disc disease), lumbosacral 03/24/2021   Lumbosacral facet arthropathy (Left: L3-4, L4-5, and L5-S1) 03/24/2021   Tricompartment osteoarthritis of knee (Left) 03/24/2021   Baker cyst (Left) 03/24/2021   Chronic low back pain (1ry area of Pain) (Bilateral) (R>L) w/o sciatica 03/24/2021   Lumbar facet syndrome (Bilateral) 03/24/2021   Chronic lower extremity pain (2ry area of Pain) (Bilateral) (L>R) 03/24/2021   Lumbosacral radiculitis/sensory radiculopathy at L2 (Bilateral) 03/24/2021   Lumbosacral radiculitis/sensory radiculopathy at L3 (Bilateral) 03/24/2021   Chronic pain syndrome 03/23/2021   Pharmacologic therapy 03/23/2021   Disorder of skeletal system 03/23/2021   Problems influencing health status 03/23/2021   PAD (peripheral artery disease) ([place]) 10/01/2020   GAD (generalized anxiety disorder) 01/13/2018   MDD (major depressive disorder) 83/15/1761   Umbilical hernia 60/73/7106   Chronic knee pain (Left) 08/11/2017   Derangement of medial meniscus, posterior horn (Left) 08/11/2017   Vitamin D insufficiency 03/05/2016  BPH without obstruction/lower urinary tract symptoms 07/08/2015   Chronic fatigue 10/23/2014   Parkinson's disease ([place]) 09/20/2014   Major depression, recurrent, full remission ([place]) 07/26/2013   Unsteady gait 07/26/2013   Orthostatic hypotension 07/26/2013   Depression 07/26/2013   Anxiety 06/23/2013   Chronic anxiety 06/23/2013    Tremor 05/18/2013   Bilateral carotid artery stenosis 08/30/2012    ONSET DATE: 08/11/21 date of referral.  PD symptoms since 2015.  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  PERTINENT HISTORY: Per chart, pt with hx of Parkinson's disease ([place]); Chronic knee pain (Left); Derangement of medial meniscus, posterior horn (Left); PAD (peripheral artery disease) ([place]); Chronic pain syndrome; DDD (degenerative disc disease) lumbosacral; osteoarthritis of knee (Left); Chronic low back pain, Lumbar facet syndrome (Bilateral); Chronic lower extremity pain. PRECAUTIONS: Fall  WEIGHT BEARING RESTRICTIONS No  PAIN:  Are you having pain? Yes: NPRS scale: 3/10 Pain location: low back Pain description: achy Aggravating factors: N/A, constant Relieving factors: rest, repositioning  PATIENT GOALS : Increase efficiency with daily tasks, increase [facility] skills   OBJECTIVE:   TODAY'S TREATMENT:  Therapeutic Exercise: Facilitated hand strengthening with use of hand gripper set at moderate resistance with 1 green band for 2 sets 10 reps, and 2 green bands for 2 more sets 10 reps for each hand.  Facilitated pinch strengthening with use of therapy resistant clothespins to target lateral and 3 point pinch of L hand.  Able to pinch all colors in the R hand, all but black in the L hand d/t L thumb pain and weakness.  Performed 3 trials on the R hand and 2 trials on the L hand, working to increase bilat hand strength for ADLs.    Self Care: Focus on text efficiency on pt's cell phone with trial of IF vs stylus for typing.  VC to increase efficiency with use of predictive text.  OT also adjusted screen to increase font  size for easier viewing.  Provided instruction for increasing distal stability with making sure to have forearm support on tabletop.  Encouraged pt use arm rest of chair to support forearm when texting at home if not at table top.    PATIENT EDUCATION: Education details: efficient text strategies Person educated: Patient and Spouse Education method: Explanation and Verbal cues Education comprehension: verbalized understanding, returned demo   HOME EXERCISE PROGRAM:  BUE strengthening with yellow/red theraband   GOALS: Goals reviewed with patient? Yes  SHORT TERM GOALS: Target date: 09/30/21  Pt will be indep to perform HEP for increasing strength and coordination in B hands.  Baseline: HEP not yet initiated Goal status: INITIAL  2.  Pt will be indep to verbalize 3 fall prevention strategies to implement during ADLs.  Baseline: Not yet initiated Goal status: INITIAL   3 LONG TERM GOALS: 11/11/21  Pt will increase FOTO score by 3 or more points to indicate increased perceived functional performance with daily tasks.  Baseline: FOTO 60 Goal status: INITIAL  2.  Pt will be able to efficiently send short text messages to family/friends with modified techniques as needed (ie: use of stylus, voice to text) Baseline: Spouse sends texts for pt Goal status: INITIAL  3.  Pt will fill out a check for bill paying  with 90% legibility with adapted pen as needed. Baseline: Pt reports inconsistent legibility Goal status: INITIAL  4.  Pt will increase efficiency with dressing tasks, donning clothes in 15 min or less. Baseline: Spouse reports it can take pt 20-30 min to dress in the morning Goal status: INITIAL    ASSESSMENT:  CLINICAL IMPRESSION:   Good tolerance to BUE exercises this date.  Focus on text efficiency on pt's cell phone with trial of IF vs stylus for typing.  This was pt's first time using a stylus and pt required vc for technique.  Pt stated it would take some getting  used to but appeared more accurate with hitting desired buttons when using stylus vs using IF.  OT encouraged pt continue to practice with stylus at home and be mindful of positioning.  Provided instruction for increasing distal stability with making sure to have forearm support on tabletop.  Pt found this to be helpful.  Encouraged pt use arm rest of chair to support forearm when texting at home if not at table top.  Vc to increase efficiency with use of predictive text.  OT also adjusted screen to increase font size for easier viewing.  Pt continues to work on improving  BUE strength and activity tolerance, and [place] skills in order to work towards improving engagement with, and maximizing independence with ADLs, and IADL tasks.     PERFORMANCE DEFICITS in functional skills including ADLs, IADLs, coordination, dexterity, ROM, strength, pain, flexibility, FMC, and GMC, cognitive skills including safety awareness.  IMPAIRMENTS are limiting patient from ADLs, IADLs, and leisure.   COMORBIDITIES has co-morbidities such as scoliosis, chronic back pain and leg pain  that affects occupational performance. Patient will benefit from skilled OT to address above impairments and improve overall function.  MODIFICATION OR ASSISTANCE TO COMPLETE EVALUATION: No modification of tasks or assist necessary to complete an evaluation.  OT OCCUPATIONAL PROFILE AND HISTORY: Problem focused assessment: Including review of records relating to presenting problem.  CLINICAL DECISION MAKING: Moderate - several treatment options, min-mod task modification necessary  REHAB POTENTIAL: Good  EVALUATION COMPLEXITY: Moderate    PLAN: OT FREQUENCY: 2x/week  OT DURATION: 12 weeks  PLANNED INTERVENTIONS: self care/ADL training, therapeutic exercise, therapeutic activity, neuromuscular re-education, manual therapy, passive range of motion, balance training, functional mobility training, moist heat, cryotherapy, patient/family  education, cognitive remediation/compensation, energy conservation, coping strategies training, and DME and/or AE instructions  RECOMMENDED OTHER SERVICES: N/A  CONSULTED AND AGREED WITH PLAN OF CARE: Patient and family member/caregiver  PLAN FOR NEXT SESSION: See above   Leta Speller, MS, OTR/L  Darleene Cleaver, OT 10/16/2021, 12:00 PM

## 2021-10-16 NOTE — Therapy (Signed)
OUTPATIENT PHYSICAL THERAPY NEURO TREATMENT   Patient Name: Joseph Arroyo MRN: [id number] DOB:05-24-1940, 81 y.o., male Today's Date: 10/16/2021   PCP: Olin Hauser, DO REFERRING PROVIDER: Lavena Bullion, NP   PT End of Session - 10/16/21 1306     Visit Number 8    Number of Visits 25    Date for PT Re-Evaluation 11/13/21    Authorization Type [employer]    Authorization Time Period Initial Cert 5/36/1443- 1/54/0086    Progress Note Due on Visit 10    PT Start Time 1302    PT Stop Time 1344    PT Time Calculation (min) 42 min    Equipment Utilized During Treatment Gait belt    Activity Tolerance Patient tolerated treatment well    Behavior During Therapy [facility] for tasks assessed/performed                 Past Medical History:  Diagnosis Date   Allergic rhinitis due to pollen 11/21/2007   Brachial neuritis or radiculitis NOS    Cervicalgia    Concussion    age 81 - s/p accident   Costal chondritis    Depression    Essential and other specified forms of tremor    GERD (gastroesophageal reflux disease)    in past   Lumbago    Occlusion and stenosis of carotid artery without mention of cerebral infarction    Seizures [facility])    age 40 - after concussion   Past Surgical History:  Procedure Laterality Date   CATARACT EXTRACTION Right 07/18/14   CATARACT EXTRACTION W/PHACO Left 08/01/2014   Procedure: CATARACT EXTRACTION PHACO AND INTRAOCULAR LENS PLACEMENT ([place]);  Surgeon: Leandrew Koyanagi, MD;  Location: [place];  Service: Ophthalmology;  Laterality: Left;  IVA TOPICAL   COLONOSCOPY  2008   OTHER SURGICAL HISTORY  2002   ear surgery   STAPEDES SURGERY Right 2002   Patient Active Problem List   Diagnosis Date Noted   Dysphagia, pharyngeal phase 09/17/2021   Cervicalgia 08/05/2021   Spondylosis without myelopathy or radiculopathy, lumbosacral region 06/24/2021   Lumbosacral facet hypertrophy 06/03/2021   Lumbar central  spinal stenosis, w/o neurogenic claudication (L4-5) 06/03/2021   Lumbar lateral recess stenosis (Bilateral: L2-3, L4-5) (Right: L3-4) 06/03/2021   Lumbosacral foraminal stenosis (Bilateral: L2-3, L3-4) (Left: L5-S1) 06/03/2021   Lumbar nerve root impingement (Right: L3 at L2-3 & L3-4) 06/03/2021   Ligamentum flavum hypertrophy (L3-4, L4-5) 06/03/2021   Abnormal MRI, lumbar spine (04/23/2021) 04/24/2021   Long term prescription benzodiazepine use (alprazolam) (Xanax) 03/24/2021   Grade 1 Retrolisthesis of L2/L3 (5 mm) and L3/L4 (3 mm) 03/24/2021   Levoscoliosis of lumbar spine (L3-4 apex) 03/24/2021   DDD (degenerative disc disease), lumbosacral 03/24/2021   Lumbosacral facet arthropathy (Left: L3-4, L4-5, and L5-S1) 03/24/2021   Tricompartment osteoarthritis of knee (Left) 03/24/2021   Baker cyst (Left) 03/24/2021   Chronic low back pain (1ry area of Pain) (Bilateral) (R>L) w/o sciatica 03/24/2021   Lumbar facet syndrome (Bilateral) 03/24/2021   Chronic lower extremity pain (2ry area of Pain) (Bilateral) (L>R) 03/24/2021   Lumbosacral radiculitis/sensory radiculopathy at L2 (Bilateral) 03/24/2021   Lumbosacral radiculitis/sensory radiculopathy at L3 (Bilateral) 03/24/2021   Chronic pain syndrome 03/23/2021   Pharmacologic therapy 03/23/2021   Disorder of skeletal system 03/23/2021   Problems influencing health status 03/23/2021   PAD (peripheral artery disease) ([place]) 10/01/2020   GAD (generalized anxiety disorder) 01/13/2018   MDD (major depressive disorder) 76/19/5093   Umbilical hernia 26/71/2458  Chronic knee pain (Left) 08/11/2017   Derangement of medial meniscus, posterior horn (Left) 08/11/2017   Vitamin D insufficiency 03/05/2016   BPH without obstruction/lower urinary tract symptoms 07/08/2015   Chronic fatigue 10/23/2014   Parkinson's disease ([place]) 09/20/2014   Major depression, recurrent, full remission ([place]) 07/26/2013   Unsteady gait 07/26/2013   Orthostatic hypotension  07/26/2013   Depression 07/26/2013   Anxiety 06/23/2013   Chronic anxiety 06/23/2013   Tremor 05/18/2013   Bilateral carotid artery stenosis 08/30/2012    ONSET DATE: 08/11/2021  REFERRING DIAG: Parkinsons Disease  THERAPY DIAG:  Difficulty in walking, not elsewhere classified  Abnormality of gait and mobility  Unsteadiness on feet  Other abnormalities of gait and mobility  Rationale for Evaluation and Treatment Rehabilitation  SUBJECTIVE:                                                                                                                                                                                              SUBJECTIVE STATEMENT: Patient reports no falls or LOB since last session. Had to park today without [place].   Pt accompanied by: significant other  PERTINENT HISTORY: Per chart history provided by Dr. Consuela Mimes- Mr. Slawinski has Parkinson's disease ([place]); Chronic knee pain (Left); Derangement of medial meniscus, posterior horn (Left); PAD (peripheral artery disease) ([place]); Chronic pain syndrome; Grade 1 Retrolisthesis of L2/L3 (5 mm) and L3/L4 (3 mm); Levoscoliosis of lumbar spine (L3-4 apex); DDD (degenerative disc disease), lumbosacral; Lumbosacral facet arthropathy (Left: L3-4, L4-5, and L5-S1); Tricompartment osteoarthritis of knee (Left); Baker cyst (Left); Chronic low back pain (1ry area of Pain) (Bilateral) (R>L) w/o sciatica; Lumbar facet syndrome (Bilateral); Chronic lower extremity pain (2ry area of Pain) (Bilateral) (L>R); Lumbosacral radiculitis/sensory radiculopathy at L2 (Bilateral); Lumbosacral radiculitis/sensory radiculopathy at L3 (Bilateral); Abnormal MRI, lumbar spine (04/23/2021); Lumbosacral facet hypertrophy; Lumbar central spinal stenosis, w/o neurogenic claudication (L4-5); Lumbar lateral recess stenosis (Bilateral: L2-3, L4-5) (Right: L3-4); Lumbosacral foraminal stenosis (Bilateral: L2-3, L3-4) (Left: L5-S1); Lumbar nerve root impingement  (Right: L3 at L2-3 & L3-4); Ligamentum flavum hypertrophy (L3-4, L4-5); Spondylosis without myelopathy or radiculopathy, lumbosacral region; and Cervicalgia on their pertinent problem list   Patient has appointment for Epidural to low back next Tues 08/26/2021  PAIN:  Are you having pain? Yes: NPRS scale: 8/10 Pain location: low back Pain description: ache/soreness Aggravating factors: prolonged standing, transfers, walking Relieving factors: rest and medications  PRECAUTIONS: Fall  WEIGHT BEARING RESTRICTIONS No  FALLS: Has patient fallen in last 6 months? Yes. Number of falls 2  LIVING ENVIRONMENT: Lives with: lives with their spouse Lives in: House/apartment Stairs: 4 Has following equipment  at home: Gilford Rile - 2 wheeled  PLOF: Needs assistance with ADLs- intermittent  PATIENT GOALS  to walk better and be as active as possible. Also to get in/out of cars better.   OBJECTIVE: (objective measures completed at initial evaluation unless otherwise dated)   DIAGNOSTIC FINDINGS: IMPRESSION: 1. Severe lumbar levoscoliosis with widespread advanced disc and facet degeneration. 2. Mild spinal stenosis at L4-5. 3. Moderate neural foraminal stenosis on the right at L3-4 and on the left at L5-S1. 4. Moderate right lateral recess stenosis at L2-3.      LOWER EXTREMITY MMT:    MMT (evaluation)  Right Eval Left Eval  Hip flexion 4 4  Hip extension 4 4  Hip abduction 4 4  Hip adduction 4 4  Hip internal rotation 4 4  Hip external rotation 4 4  Knee flexion 4 4  Knee extension 4 4  Ankle dorsiflexion 4 4  Ankle plantarflexion 4 4  Ankle inversion 4 4  Ankle eversion 4 4  (Blank rows = not tested)     FUNCTIONAL TESTS (EVALUATION):  5 times sit to stand: 14 sec without UE support Timed up and go (TUG): 15.67 sec with RW 10 meter walk test: 10.67 sec = 0.94 m/s using RW Berg Balance Scale: 48/56- see flowsheet for details  PATIENT SURVEYS:  FOTO : 49  TODAY'S  TREATMENT 10/16/21 TherEx- performed with seated rest and with Mhp applied to low back   Ball roll outs x 5 x 5 sec each direction (3 way) Sit to stand 10x  GTB abduction 15x GTB row 15x    NMR:   Standing with CGA next to support surface:  Airex balance beam:  -lateral stepping 4x length of // bars, short rest between reps    Ladder stepping with walker x 6 lengths through   Standing PWR! Step, Up, and rock. X 10 ea, asking frequently for feedback regarding back pain. Pt reports no increase in pain with this or any other activities this date - PWR! Step pt encouraged to limit spinal rotation.   Seated rest breaks throughout with MHP applied to low back, good response to this for limiting back pain   PATIENT EDUCATION: Education details: need to monitor BP closely  Person educated: Patient Education method: Explanation, Demonstration, Tactile cues, and Verbal cues Education comprehension: verbalized understanding, returned demonstration, verbal cues required, tactile cues required, and needs further education   HOME EXERCISE PROGRAM: PWR! Basic 4 seated x 10 ea, handout provided: addended to not perform seated PRW! Rock exercise and added the below to program  on 09/25/21    Access Code: [email or web address] URL: [email or web address] Date: 09/25/2021 Prepared by: Rivka Barbara  Exercises - Standing Romberg to 3/4 Tandem Stance  - 1 x daily - 7 x weekly - 2 sets - 30 seocnd hold - Standing March with Counter Support  - 1 x daily - 7 x weekly - 2 sets - 10 reps - Modified Single Leg Balance on Step  - 1 x daily - 7 x weekly - 2 sets - 30 second hold  8/17: added standing PWR! Excluding PWR! Twist. Handout provided and pt encouraged to practice at support surface and with chair behind ( PWR! Up)   GOALS: Goals reviewed with patient? Yes  SHORT TERM GOALS: Target date: 10/02/2021  Pt will be independent with initial HEP in order to improve strength and balance in  order to decrease fall risk and improve function at home and work.  Baseline: 08/21/2021-No formal  HEP in place Goal status: INITIAL    LONG TERM GOALS: Target date: 9/14//2023  Pt will improve BERG by at least 3 points in order to demonstrate clinically significant improvement in balance.    Baseline: 08/21/2021= 48/56 Goal status: INITIAL  2.  Pt will be independent with final HEP in order to improve strength and balance in order to decrease fall risk and improve function at home and work.  Baseline: No HEP in place Goal status: INITIAL  3.  Pt will decrease 5TSTS by at least 4 seconds in order to demonstrate clinically significant improvement in LE strength. Baseline: 08/21/2021= 19.01 sec without UE support Goal status: INITIAL  4.  Pt will decrease TUG to below 14 seconds/decrease in order to demonstrate decreased fall risk. Baseline:  08/21/2021=15.67 with RW Goal status: INITIAL  5.  Pt will improve FOTO to target score of 56 to display perceived improvements in ability to complete ADL's.  Baseline: 08/21/2021=49 Goal status: INITIAL  6.  Pt will increase 10MWT by at least 0.15 m/s in order to demonstrate clinically significant improvement in community ambulation.   Baseline: 08/22/2021=0.94 m/s Goal status: INITIAL  ASSESSMENT:  CLINICAL IMPRESSION:  Patient presents with excellent motivation. He does require use of heat pad between interventions to keep back pain within functional levels for activities and to prevent excess pain following therapeutic intervention. Pt educated regarding PWR! Interventions and tht these will be implemented based on pt function and limitations. Pt provided with standing PWR! Handout for further practice. Pt will continue to benefit from skilled physical therapy intervention to address impairments, improve QOL, and attain therapy goals.      OBJECTIVE IMPAIRMENTS Abnormal gait, decreased activity tolerance, decreased balance, decreased  coordination, decreased endurance, decreased knowledge of use of DME, decreased mobility, difficulty walking, decreased ROM, decreased strength, hypomobility, and impaired flexibility.   ACTIVITY LIMITATIONS carrying, lifting, bending, standing, squatting, sleeping, stairs, transfers, continence, bathing, toileting, and dressing  PARTICIPATION LIMITATIONS: cleaning, laundry, medication management, driving, shopping, community activity, and yard work  PERSONAL FACTORS Age are also affecting patient's functional outcome.   REHAB POTENTIAL: Good  CLINICAL DECISION MAKING: Stable/uncomplicated  EVALUATION COMPLEXITY: Low  PLAN: PT FREQUENCY: 2x/week  PT DURATION: 12 weeks  PLANNED INTERVENTIONS: Therapeutic exercises, Therapeutic activity, Neuromuscular re-education, Balance training, Gait training, Patient/Family education, Joint mobilization, Stair training, Vestibular training, [place] repositioning, DME instructions, Dry Needling, Electrical stimulation, Spinal manipulation, and Spinal mobilization  PLAN FOR NEXT SESSION: continue balance training, monitor hypotension and orthostatics, ROM for flexibility,    Particia Lather PT  10/16/21, 4:46 PM

## 2021-10-17 ENCOUNTER — Encounter: Payer: Self-pay | Admitting: Family Medicine

## 2021-10-17 ENCOUNTER — Ambulatory Visit (INDEPENDENT_AMBULATORY_CARE_PROVIDER_SITE_OTHER): Payer: Medicare HMO | Admitting: Family Medicine

## 2021-10-17 VITALS — BP 112/67 | HR 57 | Ht 63.5 in | Wt 131.4 lb

## 2021-10-17 DIAGNOSIS — M545 Low back pain, unspecified: Secondary | ICD-10-CM

## 2021-10-17 DIAGNOSIS — G20 Parkinson's disease: Secondary | ICD-10-CM | POA: Diagnosis not present

## 2021-10-17 DIAGNOSIS — G894 Chronic pain syndrome: Secondary | ICD-10-CM

## 2021-10-17 DIAGNOSIS — G8929 Other chronic pain: Secondary | ICD-10-CM | POA: Diagnosis not present

## 2021-10-17 MED ORDER — BACLOFEN 10 MG PO TABS: 5.0000 mg | ORAL_TABLET | Freq: Three times a day (TID) | ORAL | 1 refills | Status: DC | PRN

## 2021-10-17 NOTE — Patient Instructions (Addendum)
Thank you for coming to the office today.  Tylenol Ext Str '500mg'$  x 2 = '1000mg'$  - may take 3 times a day - safe to take longer term  Ibuprofen '200mg'$  tabs x 2-3 = 400 - '600mg'$  per dose - 3 times a day every 6-8 hours as needed.  Meloxicam is similar to the ibuprofen, counts as an anti inflammatory.  Restart Baclofen as needed, I will re order it for you. Take half or whole tab as needed up to max 3 times a day.  OTC Peppermint Oil (Triple Coated Capsule) '180mg'$  take one 3 times daily to reduce diarrhea  Future C Diff testing if need.  Mild low Sodium, we discussed balance the rehydration. Try to avoid excessive regular water intake.   Please schedule a Follow-up Appointment to: Return in about 4 months (around 02/16/2022), or if symptoms worsen or fail to improve, for 3-4 month follow-up as needed telephone visit will work for updates.  If you have any other questions or concerns, please feel free to call the office or send a message through [place]. You may also schedule an earlier appointment if necessary.  Additionally, you may be receiving a survey about your experience at our office within a few days to 1 week by e-mail or mail. We value your feedback.  Nobie Putnam, DO [place]

## 2021-10-17 NOTE — Progress Notes (Unsigned)
Subjective:    Patient ID: Joseph Arroyo, male    DOB: [birth year], 81 y.o.   MRN: [id number]  Joseph Arroyo is a 81 y.o. male presenting on 10/17/2021 for Annual Exam   HPI  Parkinsons Disease Progressive worsening. [facility] - Lavena Bullion MD Referred to [facility] PT / OT and SLP and swallow study  [facility] - Dr Dossie Arbour - Has had x 2 epidural and facet injection - Not as much relief from  He still experiences back pain  Taking Tylenol 1069m 3 times a day Ibuprofen PRN  Gabapentin 1054mAM and noon and 2 at night  Previously on Baclofen, only took at night. Unsure if helping, may try again.  upcoming eye apt Has hearing eval scheduled  Wearing pull-ups depends  Psychiatry - Followed by Dr [facility] Pun med management  Health Maintenance: Last Cologuard 03/2020 negative.     10/13/2021    3:34 PM 07/08/2021    9:36 AM 06/24/2021    2:34 PM  Depression screen PHQ 2/9  Decreased Interest 0 0 0  Down, Depressed, Hopeless 0 0 0  PHQ - 2 Score 0 0 0    Past Medical History:  Diagnosis Date   Allergic rhinitis due to pollen 11/21/2007   Brachial neuritis or radiculitis NOS    Cervicalgia    Concussion    age 81 s/p accident   Costal chondritis    Depression    Essential and other specified forms of tremor    GERD (gastroesophageal reflux disease)    in past   Lumbago    Occlusion and stenosis of carotid artery without mention of cerebral infarction    [facility] 81 [facility]   Past Surgical History:  Procedure Laterality Date   CATARACT EXTRACTION Right 07/18/14   CATARACT EXTRACTION W/PHACO Left 08/01/2014   Procedure: CATARACT EXTRACTION PHACO AND [place]TRAOCULAR LENS PLACEMENT [place]  Surgeon: ChLeandrew KoyanagiMD;  [place]: Ophthalmology;  Laterality: Left;  IVA TOPICAL   COLONOSCOPY  2008   OTHER SURGICAL HISTORY  2002   ear surgery   STAPEDES SURGERY Right 2002   Social  History   Socioeconomic History   Marital status: Married    Spouse name: RuRod Hollerhanguion   Number of children: 2   Years of education: 12   Highest education level: Master's degree (e.g., MA, MS, MEng, MEd, MSW, MBA)  Occupational History   Occupation: Retired - [employer]   Employer: RETIRED  Tobacco Use   Smoking status: Never   Smokeless tobacco: Never  Vaping Use   Vaping Use: Never used  Substance and Sexual Activity   Alcohol use: No    Alcohol/week: 0.0 standard drinks of alcohol   Drug use: No   Sexual activity: Not Currently  Other Topics Concern   Not on file  Social History Narrative   Married to RuNormanhas 2 children, has grandchildren   Right handed   Master's plus   [place] side[place]      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    Social Determinants of Health   Financial Resource Strain: Low Risk  (10/13/2021)   Overall Financial Resource Strain (CARDIA)    Difficulty of Paying Living Expenses: Not hard at all  Food Insecurity: No Food Insecurity (10/13/2021)   Hunger Vital Sign    Worried About Running Out of Food  in the Last Year: Never true    [place] in the Last Year: Never true  Transportation Needs: No Transportation Needs (10/13/2021)   PRAPARE - Hydrologist (Medical): No    Lack of Transportation (Non-Medical): No  Physical Activity: Insufficiently Active (10/13/2021)   Exercise Vital Sign    Days of Exercise per Week: 3 days    Minutes of Exercise per Session: 30 min  Stress: No Stress Concern Present (10/13/2021)   [place]    Feeling of Stress : Not at all  Social Connections: [place] (10/13/2021)   Social Connection and Isolation Panel [NHANES]    Frequency of Communication with Friends and Family: More than three times a week    Frequency of Social Gatherings  with Friends and Family: Never    Attends Religious Services: More than 4 times per year    Active Member of [employer] or Organizations: Yes    Attends Archivist Meetings: 1 to 4 times per year    Marital Status: Married  Human resources officer Violence: Not At Risk (08/24/2017)   Humiliation, Afraid, Rape, and Kick questionnaire    Fear of Current or Ex-Partner: No    Emotionally Abused: No    Physically Abused: No    Sexually Abused: No   Family History  Problem Relation Age of Onset   Heart failure Mother        enlarged heart    Asthma Father    Current Outpatient Medications on File Prior to Visit  Medication Sig   acetaminophen (TYLENOL) 500 MG tablet Take 1,000 mg by mouth every 8 (eight) hours as needed.   ALPRAZolam (XANAX) 0.25 MG tablet Take 1 tablet (0.25 mg total) by mouth 3 (three) times daily as needed for anxiety.   B Complex Vitamins (VITAMIN B COMPLEX PO) Take 1 tablet by mouth daily.   Capsicum-Garlic 016-553 MG CAPS Take 200-300 mg by mouth.   carbidopa-levodopa (SINEMET IR) 25-100 MG tablet Take 2.5 tablets by mouth 3 (three) times daily.   cloNIDine (CATAPRES) 0.1 MG tablet 1/2 tablet twice daily   gabapentin (NEURONTIN) 100 MG capsule 2 CAPSULES twice daily (Patient taking differently: Take 100 mg by mouth. 1 in AM and noon and 2 at night)   HAWTHORNE BERRY PO Take by mouth daily.   L-Methylfolate 15 MG TABS Take 1 tablet (15 mg total) by mouth daily.   Lutein 10 MG TABS Take 1 tablet by mouth 3 (three) times daily. 1 daily   PARoxetine (PAXIL) 20 MG tablet TAKE 1 TABLET BY MOUTH EVERY DAY (WITH THE 40 MG TABLET FOR 60 MG TOTAL)   PARoxetine (PAXIL) 40 MG tablet TAKE 1 TABLET BY MOUTH EVERY DAY (WITH THE 20 MG TABLET FOR 60 MG TOTAL)   pramipexole (MIRAPEX) 0.125 MG tablet Take 0.125 mg by mouth 2 (two) times daily. 1 tab HS   PSYLLIUM HUSK PO Take by mouth.   Saw Palmetto, Serenoa repens, 1000 MG CAPS Take 2 capsules by mouth daily.    clotrimazole-betamethasone (LOTRISONE) cream Apply 1-2 times a day for worsening flare dry skin dermatitis of toes/feet, may re-use daily up to 1 week as needed. (Patient not taking: Reported on 10/13/2021)   Magnesium 200 MG TABS Take 200 mg by mouth daily. (Patient not taking: Reported on 10/13/2021)   No current facility-administered medications on file prior to visit.    Review of Systems  Constitutional:  Negative for activity change, appetite change, chills, diaphoresis, fatigue and fever.  HENT:  Negative for congestion and hearing loss.   Eyes:  Negative for visual disturbance.  Respiratory:  Negative for cough, chest tightness, shortness of breath and wheezing.   Cardiovascular:  Negative for chest pain, palpitations and leg swelling.  Gastrointestinal:  Negative for abdominal pain, constipation, diarrhea, nausea and vomiting.  Genitourinary:  Negative for dysuria, frequency and hematuria.  Musculoskeletal:  Negative for arthralgias and neck pain.  Skin:  Negative for rash.  Neurological:  Positive for tremors and weakness. Negative for dizziness, light-headedness, numbness and headaches.  Hematological:  Negative for adenopathy.  Psychiatric/Behavioral:  Negative for behavioral problems, dysphoric mood and sleep disturbance.    Per HPI unless specifically indicated above      Objective:    BP 112/67   Pulse (!) 57   Ht 5' 3.5" (1.613 m)   Wt 131 lb 6.4 oz (59.6 kg)   SpO2 99%   BMI 22.91 kg/m   Wt Readings from Last 3 Encounters:  10/17/21 131 lb 6.4 oz (59.6 kg)  10/13/21 132 lb 6.4 oz (60.1 kg)  09/11/21 126 lb (57.2 kg)    Physical Exam Vitals and nursing note reviewed.  Constitutional:      General: He is not in acute distress.    Appearance: Normal appearance. He is well-developed. He is not diaphoretic.     Comments: Well-appearing, comfortable, cooperative  HENT:     Head: Normocephalic and atraumatic.  Eyes:     General:        Right eye: No discharge.         Left eye: No discharge.     Conjunctiva/sclera: Conjunctivae normal.  Cardiovascular:     Rate and Rhythm: Normal rate.  Pulmonary:     Effort: Pulmonary effort is normal.  Musculoskeletal:     Right lower leg: No edema.     Left lower leg: No edema.     Comments: Pes Cavus, bilateral R foot with some toes compressing against each other, some mild swelling. No abrasion.  Skin:    General: Skin is warm and dry.     Findings: No erythema or rash.  Neurological:     General: No focal deficit present.     Mental Status: He is alert and oriented to person, place, and time.     Comments: Tremors slowed down.  Psychiatric:        Mood and Affect: Mood normal.        Behavior: Behavior normal.        Thought Content: Thought content normal.     Comments: Well groomed, good eye contact, normal speech and thoughts    Results for orders placed or performed in visit on 10/10/21  TSH  Result Value Ref Range   TSH 0.97 0.40 - 4.50 mIU/L  PSA  Result Value Ref Range   PSA 1.33 < OR = 4.00 ng/mL  Hemoglobin A1c  Result Value Ref Range   Hgb A1c MFr Bld 4.9 <5.7 % of total Hgb   Mean Plasma Glucose 94 mg/dL   eAG (mmol/L) 5.2 mmol/L  Lipid panel  Result Value Ref Range   Cholesterol 138 <200 mg/dL   HDL 63 > OR = 40 mg/dL   Triglycerides 58 <150 mg/dL   LDL Cholesterol (Calc) 62 mg/dL (calc)   Total CHOL/HDL Ratio 2.2 <5.0 (calc)   Non-HDL Cholesterol (Calc) 75 <130 mg/dL (calc)  CBC with Differential/Platelet  Result  Value Ref Range   WBC 5.6 3.8 - 10.8 Thousand/uL   RBC 4.42 4.20 - 5.80 Million/uL   Hemoglobin 14.5 13.2 - 17.1 g/dL   HCT 42.3 38.5 - 50.0 %   MCV 95.7 80.0 - 100.0 fL   MCH 32.8 27.0 - 33.0 pg   MCHC 34.3 32.0 - 36.0 g/dL   RDW 11.7 11.0 - 15.0 %   Platelets 193 140 - 400 Thousand/uL   MPV 9.5 7.5 - 12.5 fL   Neutro Abs 3,629 1,500 - 7,800 cells/uL   Lymphs Abs 1,210 850 - 3,900 cells/uL   Absolute Monocytes 442 200 - 950 cells/uL   Eosinophils  Absolute 258 15 - 500 cells/uL   Basophils Absolute 62 0 - 200 cells/uL   Neutrophils Relative % 64.8 %   Total Lymphocyte 21.6 %   Monocytes Relative 7.9 %   Eosinophils Relative 4.6 %   Basophils Relative 1.1 %  COMPLETE METABOLIC PANEL WITH GFR  Result Value Ref Range   Glucose, Bld 85 65 - 99 mg/dL   BUN 20 7 - 25 mg/dL   Creat 0.77 0.70 - 1.28 mg/dL   eGFR 91 > OR = 60 mL/min/1.73m   BUN/Creatinine Ratio SEE NOTE: 6 - 22 (calc)   Sodium 133 (L) 135 - 146 mmol/L   Potassium 4.2 3.5 - 5.3 mmol/L   Chloride 97 (L) 98 - 110 mmol/L   CO2 31 20 - 32 mmol/L   Calcium 9.0 8.6 - 10.3 mg/dL   Total Protein 6.3 6.1 - 8.1 g/dL   Albumin 4.0 3.6 - 5.1 g/dL   Globulin 2.3 1.9 - 3.7 g/dL (calc)   AG Ratio 1.7 1.0 - 2.5 (calc)   Total Bilirubin 0.7 0.2 - 1.2 mg/dL   Alkaline phosphatase (APISO) 44 35 - 144 U/L   AST 16 10 - 35 U/L   ALT 6 (L) 9 - 46 U/L  VITAMIN D 25 Hydroxy (Vit-D Deficiency, Fractures)  Result Value Ref Range   Vit D, 25-Hydroxy 42 30 - 100 ng/mL      Assessment & Plan:   Problem List Items Addressed This Visit     Chronic pain syndrome (Chronic)   Relevant Medications   baclofen (LIORESAL) 10 MG tablet   Parkinson's disease (HCC) - Primary (Chronic)   Other Visit Diagnoses     Chronic left-sided low back pain without sciatica       Relevant Medications   baclofen (LIORESAL) 10 MG tablet       Updated Health Maintenance information Reviewed recent lab results with patient Encouraged improvement to lifestyle with diet and exercise Goal of weight loss  Follow with Neurology and PT for Parkinsons Management  Continue with Pain Management for procedures, as they are not ordering medication management we discussed optimizing current meds now  Tylenol Ext Str 5067mx 2 = 100039m may take 3 times a day - safe to take longer term  Ibuprofen 200m61mbs x 2-3 = 400 - 600mg71m dose - 3 times a day every 6-8 hours as needed.  Meloxicam is similar to the  ibuprofen, counts as an anti inflammatory.  Restart Baclofen as needed, I will re order it for you. Take half or whole tab as needed up to max 3 times a day.  OTC Peppermint Oil (Triple Coated Capsule) 180mg 42m one 3 times daily to reduce diarrhea  Future C Diff testing if need.  Mild low Sodium, we discussed balance the rehydration. Try to avoid  excessive regular water intake.   Meds ordered this encounter  Medications   baclofen (LIORESAL) 10 MG tablet    Sig: Take 0.5-1 tablets (5-10 mg total) by mouth 3 (three) times daily as needed for muscle spasms.    Dispense:  90 each    Refill:  1      Follow up plan: Return in about 4 months (around 02/16/2022), or if symptoms worsen or fail to improve, for 3-4 month follow-up as needed telephone visit will work for updates.  Nobie Putnam, [facility] 10/17/2021, 3:06 PM

## 2021-10-20 DIAGNOSIS — H353131 Nonexudative age-related macular degeneration, bilateral, early dry stage: Secondary | ICD-10-CM | POA: Diagnosis not present

## 2021-10-20 DIAGNOSIS — H26493 Other secondary cataract, bilateral: Secondary | ICD-10-CM | POA: Diagnosis not present

## 2021-10-21 ENCOUNTER — Encounter: Payer: Medicare HMO | Admitting: Speech Pathology

## 2021-10-21 ENCOUNTER — Ambulatory Visit: Payer: Medicare HMO

## 2021-10-21 DIAGNOSIS — G20 Parkinson's disease: Secondary | ICD-10-CM | POA: Diagnosis not present

## 2021-10-21 DIAGNOSIS — R269 Unspecified abnormalities of gait and mobility: Secondary | ICD-10-CM

## 2021-10-21 DIAGNOSIS — M6281 Muscle weakness (generalized): Secondary | ICD-10-CM

## 2021-10-21 DIAGNOSIS — R2689 Other abnormalities of gait and mobility: Secondary | ICD-10-CM | POA: Diagnosis not present

## 2021-10-21 DIAGNOSIS — R278 Other lack of coordination: Secondary | ICD-10-CM

## 2021-10-21 DIAGNOSIS — R2681 Unsteadiness on feet: Secondary | ICD-10-CM

## 2021-10-21 DIAGNOSIS — R471 Dysarthria and anarthria: Secondary | ICD-10-CM | POA: Diagnosis not present

## 2021-10-21 DIAGNOSIS — R490 Dysphonia: Secondary | ICD-10-CM | POA: Diagnosis not present

## 2021-10-21 DIAGNOSIS — R262 Difficulty in walking, not elsewhere classified: Secondary | ICD-10-CM

## 2021-10-21 NOTE — Therapy (Signed)
OUTPATIENT PHYSICAL THERAPY NEURO TREATMENT   Patient Name: Joseph Arroyo MRN: [id number] DOB:[birth year], 81 y.o., male Today's Date: 10/22/2021   PCP: Joseph Hauser, DO REFERRING PROVIDER: Lavena Bullion, NP   PT End of Session - 10/21/21 1439     Visit Number 9    Number of Visits 25    Date for PT Re-Evaluation 11/13/21    Authorization Type [employer]    Authorization Time Period Initial Cert 6/72/0947- 0/96/2836    Progress Note Due on Visit 10    PT Start Time 1517    PT Stop Time 1559    PT Time Calculation (min) 42 min    Equipment Utilized During Treatment Gait belt    Activity Tolerance Patient tolerated treatment well    Behavior During Therapy Joseph [facility]o for tasks assessed/performed                 Past Medical History:  Diagnosis Date   Allergic rhinitis due to pollen 11/21/2007   Brachial neuritis or radiculitis NOS    Cervicalgia    Concussion    age 81 - s/p accident   Costal chondritis    Depression    Essential and other specified forms of tremor    GERD (gastroesophageal reflux disease)    in past   Lumbago    Occlusion and stenosis of carotid artery without mention of cerebral infarction    Seizures [facility])    age 44 - after concussion   Past Surgical History:  Procedure Laterality Date   CATARACT EXTRACTION Right 07/18/14   CATARACT EXTRACTION W/PHACO Left 08/01/2014   Procedure: CATARACT EXTRACTION PHACO AND INTRAOCULAR LENS PLACEMENT (Joseph Arroyo);  Surgeon: Joseph Koyanagi, MD;  Location: [facility];  Service: Ophthalmology;  Laterality: Left;  IVA TOPICAL   COLONOSCOPY  2008   OTHER SURGICAL HISTORY  2002   ear surgery   STAPEDES SURGERY Right 2002   Patient Active Problem List   Diagnosis Date Noted   Dysphagia, pharyngeal phase 09/17/2021   Cervicalgia 08/05/2021   Spondylosis without myelopathy or radiculopathy, lumbosacral region 06/24/2021   Lumbosacral facet hypertrophy 06/03/2021   Lumbar central  spinal stenosis, w/o neurogenic claudication (L4-5) 06/03/2021   Lumbar lateral recess stenosis (Bilateral: L2-3, L4-5) (Right: L3-4) 06/03/2021   Lumbosacral foraminal stenosis (Bilateral: L2-3, L3-4) (Left: L5-S1) 06/03/2021   Lumbar nerve root impingement (Right: L3 at L2-3 & L3-4) 06/03/2021   Ligamentum flavum hypertrophy (L3-4, L4-5) 06/03/2021   Abnormal MRI, lumbar spine (04/23/2021) 04/24/2021   Long term prescription benzodiazepine use (alprazolam) (Xanax) 03/24/2021   Grade 1 Retrolisthesis of L2/L3 (5 mm) and L3/L4 (3 mm) 03/24/2021   Levoscoliosis of lumbar spine (L3-4 apex) 03/24/2021   DDD (degenerative disc disease), lumbosacral 03/24/2021   Lumbosacral facet arthropathy (Left: L3-4, L4-5, and L5-S1) 03/24/2021   Tricompartment osteoarthritis of knee (Left) 03/24/2021   Baker cyst (Left) 03/24/2021   Chronic low back pain (1ry area of Pain) (Bilateral) (R>L) w/o sciatica 03/24/2021   Lumbar facet syndrome (Bilateral) 03/24/2021   Chronic lower extremity pain (2ry area of Pain) (Bilateral) (L>R) 03/24/2021   Lumbosacral radiculitis/sensory radiculopathy at L2 (Bilateral) 03/24/2021   Lumbosacral radiculitis/sensory radiculopathy at L3 (Bilateral) 03/24/2021   Chronic pain syndrome 03/23/2021   Pharmacologic therapy 03/23/2021   Disorder of skeletal system 03/23/2021   Problems influencing health status 03/23/2021   PAD (peripheral artery disease) ([facility]) 10/01/2020   GAD (generalized anxiety disorder) 01/13/2018   MDD (major depressive disorder) 62/94/7654   Umbilical hernia 65/05/5463  Chronic knee pain (Left) 08/11/2017   Derangement of medial meniscus, posterior horn (Left) 08/11/2017   Vitamin D insufficiency 03/05/2016   BPH without obstruction/lower urinary tract symptoms 07/08/2015   Chronic fatigue 10/23/2014   Parkinson's disease ([facility]) 09/20/2014   Major depression, recurrent, full remission ([facility]) 07/26/2013   Unsteady gait 07/26/2013   Orthostatic hypotension  07/26/2013   Depression 07/26/2013   Anxiety 06/23/2013   Chronic anxiety 06/23/2013   Tremor 05/18/2013   Bilateral carotid artery stenosis 08/30/2012    ONSET DATE: 08/11/2021  REFERRING DIAG: Parkinsons Disease  THERAPY DIAG:  Difficulty in walking, not elsewhere classified  Abnormality of gait and mobility  Unsteadiness on feet  Other abnormalities of gait and mobility  Rationale for Evaluation and Treatment Rehabilitation  SUBJECTIVE:                                                                                                                                                                                              SUBJECTIVE STATEMENT: Patient reports his back is bothering him today. Reports no falls or LOB since last session.   Pt accompanied by: significant other  PERTINENT HISTORY: Per chart history provided by Dr. Consuela Arroyo- [name] has Parkinson's disease ([facility]royo); Chronic knee pain (Left); Derangement of medial meniscus, posterior horn (Left); PAD (peripheral artery disease) (Joseph Arroyo); Chronic pain syndrome; Grade 1 Retrolisthesis of L2/L3 (5 mm) and L3/L4 (3 mm); Levoscoliosis of lumbar spine (L3-4 apex); DDD (degenerative disc disease), lumbosacral; Lumbosacral facet arthropathy (Left: L3-4, L4-5, and L5-S1); Tricompartment osteoarthritis of knee (Left); Baker cyst (Left); Chronic low back pain (1ry area of Pain) (Bilateral) (R>L) w/o sciatica; Lumbar facet syndrome (Bilateral); Chronic lower extremity pain (2ry area of Pain) (Bilateral) (L>R); Lumbosacral radiculitis/sensory radiculopathy at L2 (Bilateral); Lumbosacral radiculitis/sensory radiculopathy at L3 (Bilateral); Abnormal MRI, lumbar spine (04/23/2021); Lumbosacral facet hypertrophy; Lumbar central spinal stenosis, w/o neurogenic claudication (L4-5); Lumbar lateral recess stenosis (Bilateral: L2-3, L4-5) (Right: L3-4); Lumbosacral foraminal stenosis (Bilateral: L2-3, L3-4) (Left: L5-S1); Lumbar nerve root  impingement (Right: L3 at L2-3 & L3-4); Ligamentum flavum hypertrophy (L3-4, L4-5); Spondylosis without myelopathy or radiculopathy, lumbosacral region; and Cervicalgia on their pertinent problem list   Patient has appointment for Epidural to low back next Tues 08/26/2021  PAIN:  Are you having pain? Yes: NPRS scale: 8/10 Pain location: low back Pain description: ache/soreness Aggravating factors: prolonged standing, transfers, walking Relieving factors: rest and medications  PRECAUTIONS: Fall  WEIGHT BEARING RESTRICTIONS No  FALLS: Has patient fallen in last 6 months? Yes. Number of falls 2  LIVING ENVIRONMENT: Lives with: lives with their spouse Lives in: House/apartment Stairs: 4 Has following  equipment at home: Gilford Rile - 2 wheeled  PLOF: Needs assistance with ADLs- intermittent  PATIENT GOALS  to walk better and be as active as possible. Also to get in/out of cars better.   OBJECTIVE: (objective measures completed at initial evaluation unless otherwise dated)   DIAGNOSTIC FINDINGS: IMPRESSION: 1. Severe lumbar levoscoliosis with widespread advanced disc and facet degeneration. 2. Mild spinal stenosis at L4-5. 3. Moderate neural foraminal stenosis on the right at L3-4 and on the left at L5-S1. 4. Moderate right lateral recess stenosis at L2-3.      LOWER EXTREMITY MMT:    MMT (evaluation)  Right Eval Left Eval  Hip flexion 4 4  Hip extension 4 4  Hip abduction 4 4  Hip adduction 4 4  Hip internal rotation 4 4  Hip external rotation 4 4  Knee flexion 4 4  Knee extension 4 4  Ankle dorsiflexion 4 4  Ankle plantarflexion 4 4  Ankle inversion 4 4  Ankle eversion 4 4  (Blank rows = not tested)     FUNCTIONAL TESTS (EVALUATION):  5 times sit to stand: 14 sec without UE support Timed up and go (TUG): 15.67 sec with RW 10 meter walk test: 10.67 sec = 0.94 m/s using RW Berg Balance Scale: 48/56- see flowsheet for details  PATIENT SURVEYS:  FOTO :  49  TODAY'S TREATMENT 10/22/21 TherEx  Swiss ball TrA activation 10x  seated Swiss ball TrA activation pressing into ball with UE raise 10x Hip flexion into RTB across bars with cues for widening BOS x 10  6" step -toe taps 10x each LE -lateral taps 10x each LE   NMR:   Standing with CGA next to support surface:  Three way hedgehog taps 8x each LE; finger tip support  Airex pad: static stand 30 seconds x 2 trials, noticeable trembling of ankles/LE's with fatigue and challenge to maintain stability Airex pad: horizontal head turns 30 seconds scanning room 10x ; cueing for arc of motion   Lateral step into warrior pose and return to stance with clap 10x; each side.  Large step over two consecutive hurdles 8x length of // bars    PATIENT EDUCATION: Education details: need to monitor BP closely  Person educated: Patient Education method: Explanation, Demonstration, Tactile cues, and Verbal cues Education comprehension: verbalized understanding, returned demonstration, verbal cues required, tactile cues required, and needs further education   HOME EXERCISE PROGRAM: PWR! Basic 4 seated x 10 ea, handout provided: addended to not perform seated PRW! Rock exercise and added the below to program on 09/25/21  Access Code: [email or web address] URL: [email or web address] Date: 09/25/2021 Prepared by: Rivka Barbara  Exercises - Standing Romberg to 3/4 Tandem Stance  - 1 x daily - 7 x weekly - 2 sets - 30 seocnd hold - Standing March with Counter Support  - 1 x daily - 7 x weekly - 2 sets - 10 reps - Modified Single Leg Balance on Step  - 1 x daily - 7 x weekly - 2 sets - 30 second hold  Access Code: [email or web address] URL: [email or web address] Date: 10/21/2021 Prepared by: Janna Arch  Exercises - Seated Abdominal Press into [employer]  - 1 x daily - 7 x weekly - 2 sets - 10 reps - 5 hold - Seated Flexion Stretch with Swiss Ball  - 1 x daily - 7 x weekly - 2 sets - 10  reps - 5 hold  GOALS: Goals reviewed with patient? Yes  SHORT TERM GOALS: Target date:  10/02/2021  Pt will be independent with initial HEP in order to improve strength and balance in order to decrease fall risk and improve function at home and work.  Baseline: 08/21/2021-No formal HEP in place Goal status: INITIAL    LONG TERM GOALS: Target date: 9/14//2023  Pt will improve BERG by at least 3 points in order to demonstrate clinically significant improvement in balance.    Baseline: 08/21/2021= 48/56 Goal status: INITIAL  2.  Pt will be independent with final HEP in order to improve strength and balance in order to decrease fall risk and improve function at home and work.  Baseline: No HEP in place Goal status: INITIAL  3.  Pt will decrease 5TSTS by at least 4 seconds in order to demonstrate clinically significant improvement in LE strength. Baseline: 08/21/2021= 19.01 sec without UE support Goal status: INITIAL  4.  Pt will decrease TUG to below 14 seconds/decrease in order to demonstrate decreased fall risk. Baseline:  08/21/2021=15.67 with RW Goal status: INITIAL  5.  Pt will improve FOTO to target score of 56 to display perceived improvements in ability to complete ADL's.  Baseline: 08/21/2021=49 Goal status: INITIAL  6.  Pt will increase 10MWT by at least 0.15 m/s in order to demonstrate clinically significant improvement in community ambulation.   Baseline: 08/22/2021=0.94 m/s Goal status: INITIAL  ASSESSMENT:  CLINICAL IMPRESSION:  Patient presents with wife. Is highly motivated throughout session despite increased back pain. Continued focus on widening BOS in addition to taking larger more purposeful movements.   Core stabilization for correction of postural deficits performed with patient verbalizing understanding. Will continue to benefit from skilled therapy in order to address remaining deficits in order to improve overall QoL.    OBJECTIVE IMPAIRMENTS Abnormal gait,  decreased activity tolerance, decreased balance, decreased coordination, decreased endurance, decreased knowledge of use of DME, decreased mobility, difficulty walking, decreased ROM, decreased strength, hypomobility, and impaired flexibility.   ACTIVITY LIMITATIONS carrying, lifting, bending, standing, squatting, sleeping, stairs, transfers, continence, bathing, toileting, and dressing  PARTICIPATION LIMITATIONS: cleaning, laundry, medication management, driving, shopping, community activity, and yard work  PERSONAL FACTORS Age are also affecting patient's functional outcome.   REHAB POTENTIAL: Good  CLINICAL DECISION MAKING: Stable/uncomplicated  EVALUATION COMPLEXITY: Low  PLAN: PT FREQUENCY: 2x/week  PT DURATION: 12 weeks  PLANNED INTERVENTIONS: Therapeutic exercises, Therapeutic activity, Neuromuscular re-education, Balance training, Gait training, Patient/Family education, Joint mobilization, Stair training, Vestibular training, Canalith repositioning, DME instructions, Dry Needling, Electrical stimulation, Spinal manipulation, and Spinal mobilization  PLAN FOR NEXT SESSION: continue balance training, monitor hypotension and orthostatics, ROM for flexibility,    Janna Arch PT  10/22/21, 6:52 AM

## 2021-10-22 NOTE — Therapy (Signed)
OUTPATIENT OCCUPATIONAL THERAPY NEURO PROGRESS NOTE/TREATMENT Reporting period 08/19/21-10/21/21 Patient Name: Joseph Arroyo MRN: [id number] DOB:07-02-1940, 81 y.o., male Today's Date: 10/22/2021  PCP: Dr. Nobie Putnam REFERRING PROVIDER: Dr. Lavena Bullion   OT End of Session - 10/22/21 1608     Visit Number 10    Number of Visits 24    Date for OT Re-Evaluation 11/11/21    Authorization Time Period Reporting period beginning 08/19/21    OT Start Time 1430    OT Stop Time 1515    OT Time Calculation (min) 45 min    Equipment Utilized During Treatment RW    Activity Tolerance Patient tolerated treatment well    Behavior During Therapy [facility] for tasks assessed/performed              Past Medical History:  Diagnosis Date   Allergic rhinitis due to pollen 11/21/2007   Brachial neuritis or radiculitis NOS    Cervicalgia    Concussion    age 59 - s/p accident   Costal chondritis    Depression    Essential and other specified forms of tremor    GERD (gastroesophageal reflux disease)    in past   Lumbago    Occlusion and stenosis of carotid artery without mention of cerebral infarction    Seizures [facility])    age 46 - after concussion   Past Surgical History:  Procedure Laterality Date   CATARACT EXTRACTION Right 07/18/14   CATARACT EXTRACTION W/PHACO Left 08/01/2014   Procedure: CATARACT EXTRACTION PHACO AND INTRAOCULAR LENS PLACEMENT ([place]);  Surgeon: Leandrew Koyanagi, MD;  Location: [place];  Service: Ophthalmology;  Laterality: Left;  IVA TOPICAL   COLONOSCOPY  2008   OTHER SURGICAL HISTORY  2002   ear surgery   STAPEDES SURGERY Right 2002   Patient Active Problem List   Diagnosis Date Noted   Dysphagia, pharyngeal phase 09/17/2021   Cervicalgia 08/05/2021   Spondylosis without myelopathy or radiculopathy, lumbosacral region 06/24/2021   Lumbosacral facet hypertrophy 06/03/2021   Lumbar central spinal stenosis, w/o neurogenic claudication  (L4-5) 06/03/2021   Lumbar lateral recess stenosis (Bilateral: L2-3, L4-5) (Right: L3-4) 06/03/2021   Lumbosacral foraminal stenosis (Bilateral: L2-3, L3-4) (Left: L5-S1) 06/03/2021   Lumbar nerve root impingement (Right: L3 at L2-3 & L3-4) 06/03/2021   Ligamentum flavum hypertrophy (L3-4, L4-5) 06/03/2021   Abnormal MRI, lumbar spine (04/23/2021) 04/24/2021   Long term prescription benzodiazepine use (alprazolam) (Xanax) 03/24/2021   Grade 1 Retrolisthesis of L2/L3 (5 mm) and L3/L4 (3 mm) 03/24/2021   Levoscoliosis of lumbar spine (L3-4 apex) 03/24/2021   DDD (degenerative disc disease), lumbosacral 03/24/2021   Lumbosacral facet arthropathy (Left: L3-4, L4-5, and L5-S1) 03/24/2021   Tricompartment osteoarthritis of knee (Left) 03/24/2021   Baker cyst (Left) 03/24/2021   Chronic low back pain (1ry area of Pain) (Bilateral) (R>L) w/o sciatica 03/24/2021   Lumbar facet syndrome (Bilateral) 03/24/2021   Chronic lower extremity pain (2ry area of Pain) (Bilateral) (L>R) 03/24/2021   Lumbosacral radiculitis/sensory radiculopathy at L2 (Bilateral) 03/24/2021   Lumbosacral radiculitis/sensory radiculopathy at L3 (Bilateral) 03/24/2021   Chronic pain syndrome 03/23/2021   Pharmacologic therapy 03/23/2021   Disorder of skeletal system 03/23/2021   Problems influencing health status 03/23/2021   PAD (peripheral artery disease) ([place]) 10/01/2020   GAD (generalized anxiety disorder) 01/13/2018   MDD (major depressive disorder) 94/76/5465   Umbilical hernia 03/54/6568   Chronic knee pain (Left) 08/11/2017   Derangement of medial meniscus, posterior horn (Left) 08/11/2017   Vitamin  D insufficiency 03/05/2016   BPH without obstruction/lower urinary tract symptoms 07/08/2015   Chronic fatigue 10/23/2014   Parkinson's disease ([place]) 09/20/2014   Major depression, recurrent, full remission ([place]) 07/26/2013   Unsteady gait 07/26/2013   Orthostatic hypotension 07/26/2013   Depression 07/26/2013    Anxiety 06/23/2013   Chronic anxiety 06/23/2013   Tremor 05/18/2013   Bilateral carotid artery stenosis 08/30/2012    ONSET DATE: 08/11/21 date of referral.  PD symptoms since 2015.  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  Pt states that he will get activity guidelines from the eye doctor if he is able to make it to therapy that day.  PERTINENT HISTORY: Per chart, pt with hx of Parkinson's disease ([place]); Chronic knee pain (Left); Derangement of medial meniscus, posterior horn (Left); PAD (peripheral artery disease) ([place]); Chronic pain syndrome; DDD (degenerative disc disease) lumbosacral; osteoarthritis of knee (Left); Chronic low back pain, Lumbar facet syndrome (Bilateral); Chronic lower extremity pain. PRECAUTIONS: Fall  WEIGHT BEARING RESTRICTIONS No  PAIN:  Are you having pain? Yes: NPRS scale: 3/10 Pain location: low back Pain description: achy Aggravating factors: N/A, constant Relieving factors: rest, repositioning  PATIENT GOALS : Increase efficiency with daily tasks, increase FMC skills   OBJECTIVE:  HAND FUNCTION: Grip strength: Right: 48 lbs; Left: 50 lbs, Lateral pinch: Right: 12 lbs, Left: 10 lbs, and 3 point pinch: Right: 11 lbs, Left: 9 lbs   COORDINATION: 9 Hole Peg test: Right: 33 sec; Left: 32 sec  TODAY'S TREATMENT:  Therapeutic Exercise: Objective measures taken and goals updated.  Facilitated hand strengthening with use of hand gripper set at 17.9# on the R hand and 11.2# on the L hand to remove jumbo pegs from pegboard x3 trials each hand.  Rest breaks between sets and min vc for grasp patterns, working to increase grip strength for manipulation of ADL  supplies.   PATIENT EDUCATION: Education details: fall prevention strategies  Person educated: Patient and Spouse Education method: Explanation and Verbal cues Education comprehension: verbalized understanding, returned demo   HOME EXERCISE PROGRAM:  BUE strengthening with yellow/red theraband   GOALS: Goals reviewed with patient? Yes  SHORT TERM GOALS: Target date: 09/30/21  Pt will be indep to perform HEP for increasing strength and coordination in B hands.  Baseline: HEP not yet initiated; 10th: pt performs yellow theraband and theraputty exercises regularly and independently Goal status: ongoing  2.  Pt will be indep to verbalize 3 fall prevention strategies to implement during ADLs.  Baseline: Not yet initiated; 10th: Pt is able to verbalize 3+ strategies for fall prevention (ie consistent use of walker, slow positional changes, removal of throw rugs, keep pathways clear)  Goal status: met   3 LONG TERM GOALS: 11/11/21  Pt will increase FOTO score by 3 or more points to indicate increased perceived functional performance with daily tasks.  Baseline: FOTO 60; 10th: will complete next session (therapist's computer unavailable) Goal status: INITIAL  2.  Pt will be able to efficiently send short text messages to family/friends with modified techniques as needed (ie: use of stylus, voice to text) Baseline: Spouse sends texts for pt; 10th: pt started practicing with use of stylus and will continue to assess if efficiency is  better with or without; pt requires reminders to use predictive texting  Goal status: ongoing  3.  Pt will fill out a check for bill paying with 90% legibility with adapted pen as needed. Baseline: Pt reports inconsistent legibility; 100% legibility with compensatory strategies: Forearm support on table top, postural corrections, wide grip pen Goal status: met  4.  Pt will increase efficiency with dressing tasks, donning clothes in 15 min or less. Baseline:  Spouse reports it can take pt 20-30 min to dress in the morning Goal status: ongoing    ASSESSMENT:  CLINICAL IMPRESSION:   Pt shows indep with current HEP.  Pt has also met goal for good legibility to write checks with use of compensatory strategies, including positioning self at a table top to provide forearm support, making postural corrections, and use of wide grip pen.  Per pt and spouse, they have made a wide variety of environmental adaptations in the home to reduce pt's fall risk, and pt is able to verbalize at least 3 strategies that he consistently uses on a daily basis for fall prevention.  Unable to complete FOTO assessment this date d/t therapist's computer unavailable (IT issue).  Will plan to complete this next session.  Pt has increased L grip strength by 10#, L lateral pinch by 2#, and improved time on 9 hole peg test bilaterally as compared to eval, despite presenting with increased tremor today in [place].  Pt continues to work on improving activity tolerance, [facility] skills, and use of compensatory strategies for more efficient texting to enable better communication with [place].  Pt will continue to benefit from skilled OT to work towards remaining goals in [place] for maximizing independence and activity tolerance with ADLs, and IADL tasks.     PERFORMANCE DEFICITS in functional skills including ADLs, IADLs, coordination, dexterity, ROM, strength, pain, flexibility, FMC, and GMC, cognitive skills including safety awareness.  IMPAIRMENTS are limiting patient from ADLs, IADLs, and leisure.   COMORBIDITIES has co-morbidities such as scoliosis, chronic back pain and leg pain  that affects occupational performance. Patient will benefit from skilled OT to address above impairments and improve overall function.  MODIFICATION OR ASSISTANCE TO COMPLETE EVALUATION: No modification of tasks or assist necessary to complete an evaluation.  OT OCCUPATIONAL PROFILE AND HISTORY: Problem  focused assessment: Including review of records relating to presenting problem.  CLINICAL DECISION MAKING: Moderate - several treatment options, min-mod task modification necessary  REHAB POTENTIAL: Good  EVALUATION COMPLEXITY: Moderate    PLAN: OT FREQUENCY: 2x/week  OT DURATION: 12 weeks  PLANNED INTERVENTIONS: self care/ADL training, therapeutic exercise, therapeutic activity, neuromuscular re-education, manual therapy, passive range of motion, balance training, functional mobility training, moist heat, cryotherapy, patient/family education, cognitive remediation/compensation, energy conservation, coping strategies training, and DME and/or AE instructions  RECOMMENDED OTHER SERVICES: N/A  CONSULTED AND AGREED WITH PLAN OF CARE: Patient and family member/caregiver  PLAN FOR NEXT SESSION: See above   Leta Speller, MS, OTR/L  Darleene Cleaver, OT 10/22/2021, 4:10 PM

## 2021-10-23 ENCOUNTER — Ambulatory Visit: Payer: Medicare HMO | Admitting: Occupational Therapy

## 2021-10-23 ENCOUNTER — Encounter: Payer: Medicare HMO | Admitting: Speech Pathology

## 2021-10-23 ENCOUNTER — Ambulatory Visit: Payer: Medicare HMO | Admitting: Physical Therapy

## 2021-10-23 DIAGNOSIS — M6281 Muscle weakness (generalized): Secondary | ICD-10-CM | POA: Diagnosis not present

## 2021-10-23 DIAGNOSIS — R2689 Other abnormalities of gait and mobility: Secondary | ICD-10-CM

## 2021-10-23 DIAGNOSIS — R278 Other lack of coordination: Secondary | ICD-10-CM | POA: Diagnosis not present

## 2021-10-23 DIAGNOSIS — R262 Difficulty in walking, not elsewhere classified: Secondary | ICD-10-CM | POA: Diagnosis not present

## 2021-10-23 DIAGNOSIS — R269 Unspecified abnormalities of gait and mobility: Secondary | ICD-10-CM | POA: Diagnosis not present

## 2021-10-23 DIAGNOSIS — R2681 Unsteadiness on feet: Secondary | ICD-10-CM

## 2021-10-23 DIAGNOSIS — R490 Dysphonia: Secondary | ICD-10-CM | POA: Diagnosis not present

## 2021-10-23 DIAGNOSIS — G20 Parkinson's disease: Secondary | ICD-10-CM | POA: Diagnosis not present

## 2021-10-23 DIAGNOSIS — R471 Dysarthria and anarthria: Secondary | ICD-10-CM | POA: Diagnosis not present

## 2021-10-23 NOTE — Therapy (Addendum)
OUTPATIENT OCCUPATIONAL THERAPY NEURO TREATMENT NOTE  Patient Name: Joseph Arroyo MRN: [id number] DOB:1940/08/19, 81 y.o., male Today's Date: 10/23/2021  PCP: Dr. Nobie Putnam REFERRING PROVIDER: Dr. Lavena Bullion   OT End of Session - 10/23/21 1748     Visit Number 11    Number of Visits 24    Date for OT Re-Evaluation 11/11/21    Authorization Time Period Reporting period beginning 08/19/21    OT Start Time 1522    OT Stop Time 1600    OT Time Calculation (min) 38 min    Activity Tolerance Patient tolerated treatment well    Behavior During Therapy [facility] for tasks assessed/performed              Past Medical History:  Diagnosis Date   Allergic rhinitis due to pollen 11/21/2007   Brachial neuritis or radiculitis NOS    Cervicalgia    Concussion    age 65 - s/p accident   Costal chondritis    Depression    Essential and other specified forms of tremor    GERD (gastroesophageal reflux disease)    in past   Lumbago    Occlusion and stenosis of carotid artery without mention of cerebral infarction    Seizures [facility])    age 71 - after concussion   Past Surgical History:  Procedure Laterality Date   CATARACT EXTRACTION Right 07/18/14   CATARACT EXTRACTION W/PHACO Left 08/01/2014   Procedure: CATARACT EXTRACTION PHACO AND INTRAOCULAR LENS PLACEMENT ([place]);  Surgeon: Leandrew Koyanagi, MD;  Location: [place];  Service: Ophthalmology;  Laterality: Left;  IVA TOPICAL   COLONOSCOPY  2008   OTHER SURGICAL HISTORY  2002   ear surgery   STAPEDES SURGERY Right 2002   Patient Active Problem List   Diagnosis Date Noted   Dysphagia, pharyngeal phase 09/17/2021   Cervicalgia 08/05/2021   Spondylosis without myelopathy or radiculopathy, lumbosacral region 06/24/2021   Lumbosacral facet hypertrophy 06/03/2021   Lumbar central spinal stenosis, w/o neurogenic claudication (L4-5) 06/03/2021   Lumbar lateral recess stenosis (Bilateral: L2-3, L4-5) (Right:  L3-4) 06/03/2021   Lumbosacral foraminal stenosis (Bilateral: L2-3, L3-4) (Left: L5-S1) 06/03/2021   Lumbar nerve root impingement (Right: L3 at L2-3 & L3-4) 06/03/2021   Ligamentum flavum hypertrophy (L3-4, L4-5) 06/03/2021   Abnormal MRI, lumbar spine (04/23/2021) 04/24/2021   Long term prescription benzodiazepine use (alprazolam) (Xanax) 03/24/2021   Grade 1 Retrolisthesis of L2/L3 (5 mm) and L3/L4 (3 mm) 03/24/2021   Levoscoliosis of lumbar spine (L3-4 apex) 03/24/2021   DDD (degenerative disc disease), lumbosacral 03/24/2021   Lumbosacral facet arthropathy (Left: L3-4, L4-5, and L5-S1) 03/24/2021   Tricompartment osteoarthritis of knee (Left) 03/24/2021   Baker cyst (Left) 03/24/2021   Chronic low back pain (1ry area of Pain) (Bilateral) (R>L) w/o sciatica 03/24/2021   Lumbar facet syndrome (Bilateral) 03/24/2021   Chronic lower extremity pain (2ry area of Pain) (Bilateral) (L>R) 03/24/2021   Lumbosacral radiculitis/sensory radiculopathy at L2 (Bilateral) 03/24/2021   Lumbosacral radiculitis/sensory radiculopathy at L3 (Bilateral) 03/24/2021   Chronic pain syndrome 03/23/2021   Pharmacologic therapy 03/23/2021   Disorder of skeletal system 03/23/2021   Problems influencing health status 03/23/2021   PAD (peripheral artery disease) ([place]) 10/01/2020   GAD (generalized anxiety disorder) 01/13/2018   MDD (major depressive disorder) 23/53/6144   Umbilical hernia 31/54/0086   Chronic knee pain (Left) 08/11/2017   Derangement of medial meniscus, posterior horn (Left) 08/11/2017   Vitamin D insufficiency 03/05/2016   BPH without obstruction/lower urinary tract  symptoms 07/08/2015   Chronic fatigue 10/23/2014   Parkinson's disease ([place]) 09/20/2014   Major depression, recurrent, full remission ([place]) 07/26/2013   Unsteady gait 07/26/2013   Orthostatic hypotension 07/26/2013   Depression 07/26/2013   Anxiety 06/23/2013   Chronic anxiety 06/23/2013   Tremor 05/18/2013   Bilateral  carotid artery stenosis 08/30/2012    ONSET DATE: 08/11/21 date of referral.  PD symptoms since 2015.  REFERRING DIAG: Parkinson's Disease  THERAPY DIAG:  Other lack of coordination  Rationale for Evaluation and Treatment Rehabilitation  SUBJECTIVE:   SUBJECTIVE STATEMENT: Pt. has an eye procedure coming up on 11/06/21 where the doctor will be cleaning one of his lenses.    PERTINENT HISTORY: Per chart, pt with hx of Parkinson's disease ([place]); Chronic knee pain (Left); Derangement of medial meniscus, posterior horn (Left); PAD (peripheral artery disease) ([place]); Chronic pain syndrome; DDD (degenerative disc disease) lumbosacral; osteoarthritis of knee (Left); Chronic low back pain, Lumbar facet syndrome (Bilateral); Chronic lower extremity pain. PRECAUTIONS: Fall  WEIGHT BEARING RESTRICTIONS No  PAIN:  Are you having pain? Yes: NPRS scale: 4-5/10 Pain location: low back Pain description: achy Aggravating factors: N/A, constant Relieving factors: rest, repositioning  PATIENT GOALS : Increase efficiency with daily tasks, increase FMC skills   OBJECTIVE:  HAND FUNCTION: Grip strength: Right: 48 lbs; Left: 50 lbs, Lateral pinch: Right: 12 lbs, Left: 10 lbs, and 3 point pinch: Right: 11 lbs, Left: 9 lbs   COORDINATION: 9 Hole Peg test: Right: 33 sec; Left: 32 sec  TODAY'S TREATMENT:   Neuromusucular re-education:  Pt. Performed Bilateral FMC tasks using the Grooved pegboard. Pt. worked on grasping the grooved pegs from a horizontal position, and moving the pegs to a vertical position in the hand to prepare for placing them in the grooved slot.  Pt. worked on translatory movements storing the pegs, and moving them through his hand from the palm to the tip of his 2nd digit and thumb. Pt. worked on removing them pegs by alternating thumb opposition to the tip of the 2nd through 5th digits. Pt. Worked on dropping the grooved pegs one at a time form the ulnar aspect of his palm. Pt.  worked on right [facility] skills using the Building services engineer Task. Pt. worked on sustaining grasp on the resistive tweezers while grasping this sticks, and moving them from a horizontal position to a vertical position to prepare for placing them into the pegboard. Pt. Required verbal cues, and cues for visual demonstration for wrist position, and hand pattern when placing them into the pegboard.    PATIENT EDUCATION: Education details: fall prevention strategies  Person educated: Patient and Spouse Education method: Explanation and Verbal cues Education comprehension: verbalized understanding, returned demo   HOME EXERCISE PROGRAM:  BUE strengthening with yellow/red theraband   GOALS: Goals reviewed with patient? Yes  SHORT TERM GOALS: Target date: 09/30/21  Pt will be indep to perform HEP for increasing strength and coordination in B hands.  Baseline: HEP not yet initiated; 10th: pt performs yellow theraband and theraputty exercises regularly and independently Goal status: ongoing  2.  Pt will be indep to verbalize 3 fall prevention strategies to implement during ADLs.  Baseline: Not yet initiated; 10th: Pt is able to verbalize 3+ strategies for fall prevention (ie consistent use of walker, slow positional changes, removal of throw rugs, keep pathways clear)  Goal status: met   3 LONG TERM GOALS: 11/11/21  Pt will increase FOTO score by 3 or more points to indicate  increased perceived functional performance with daily tasks.  Baseline: FOTO 60; 10th: will complete next session (therapist's computer unavailable) Goal status: INITIAL  2.  Pt will be able to efficiently send short text messages to family/friends with modified techniques as needed (ie: use of stylus, voice to text) Baseline: Spouse sends texts for pt; 10th: pt started practicing with use of stylus and will continue to assess if efficiency is better with or without; pt requires reminders to use predictive texting  Goal  status: ongoing  3.  Pt will fill out a check for bill paying with 90% legibility with adapted pen as needed. Baseline: Pt reports inconsistent legibility; 100% legibility with compensatory strategies: Forearm support on table top, postural corrections, wide grip pen Goal status: met  4.  Pt will increase efficiency with dressing tasks, donning clothes in 15 min or less. Baseline: Spouse reports it can take pt 20-30 min to dress in the morning Goal status: ongoing    ASSESSMENT:  CLINICAL IMPRESSION:   Pt. Continues to work on HEPs consistently at home. Pt. continues to try to engage his hands during more tasks at home.  Pt. Presents with limited [facility] skills which continue to make it difficult to  send text messages, write his name, and perform self dressing tasks efficiently. Pt. Continues to require verbal cues, tactile cues, and cues for visual demonstration for proper [place] technique.Pt continues to work on improving activity tolerance, [facility] skills, and use of compensatory strategies for more efficient texting to enable better communication with [place]. Pt will continue to benefit from skilled OT to work towards remaining goals in [place] for maximizing independence and activity tolerance with ADLs, and IADL tasks.     PERFORMANCE DEFICITS in functional skills including ADLs, IADLs, coordination, dexterity, ROM, strength, pain, flexibility, FMC, and GMC, cognitive skills including safety awareness.  IMPAIRMENTS are limiting patient from ADLs, IADLs, and leisure.   COMORBIDITIES has co-morbidities such as scoliosis, chronic back pain and leg pain  that affects occupational performance. Patient will benefit from skilled OT to address above impairments and improve overall function.  MODIFICATION OR ASSISTANCE TO COMPLETE EVALUATION: No modification of tasks or assist necessary to complete an evaluation.  OT OCCUPATIONAL PROFILE AND HISTORY: Problem focused assessment:  Including review of records relating to presenting problem.  CLINICAL DECISION MAKING: Moderate - several treatment options, min-mod task modification necessary  REHAB POTENTIAL: Good  EVALUATION COMPLEXITY: Moderate    PLAN: OT FREQUENCY: 2x/week  OT DURATION: 12 weeks  PLANNED INTERVENTIONS: self care/ADL training, therapeutic exercise, therapeutic activity, neuromuscular re-education, manual therapy, passive range of motion, balance training, functional mobility training, moist heat, cryotherapy, patient/family education, cognitive remediation/compensation, energy conservation, coping strategies training, and DME and/or AE instructions  RECOMMENDED OTHER SERVICES: N/A  CONSULTED AND AGREED WITH PLAN OF CARE: Patient and family member/caregiver  PLAN FOR NEXT SESSION: See above   Harrel Carina, MS, OTR/L   Harrel Carina, OT 10/23/2021, 5:50 PM

## 2021-10-23 NOTE — Therapy (Signed)
OUTPATIENT PHYSICAL THERAPY NEURO Physical Therapy Progress Note   Dates of reporting period  08/21/21   to   10/23/21    Patient Name: Joseph Arroyo MRN: [id number] DOB:1940/05/04, 81 y.o., male Today's Date: 10/23/2021   PCP: Olin Hauser, DO REFERRING PROVIDER: Lavena Bullion, NP   PT End of Session - 10/23/21 1438     Visit Number 10    Number of Visits 25    Date for PT Re-Evaluation 11/13/21    Authorization Type [employer]    Authorization Time Period Initial Cert 09/18/9468- 9/62/8366    Progress Note Due on Visit 10    PT Start Time 1435    PT Stop Time 1515    PT Time Calculation (min) 40 min    Equipment Utilized During Treatment Gait belt    Activity Tolerance Patient tolerated treatment well    Behavior During Therapy [facility] for tasks assessed/performed                  Past Medical History:  Diagnosis Date   Allergic rhinitis due to pollen 11/21/2007   Brachial neuritis or radiculitis NOS    Cervicalgia    Concussion    age 64 - s/p accident   Costal chondritis    Depression    Essential and other specified forms of tremor    GERD (gastroesophageal reflux disease)    in past   Lumbago    Occlusion and stenosis of carotid artery without mention of cerebral infarction    Seizures [facility])    age 85 - after concussion   Past Surgical History:  Procedure Laterality Date   CATARACT EXTRACTION Right 07/18/14   CATARACT EXTRACTION W/PHACO Left 08/01/2014   Procedure: CATARACT EXTRACTION PHACO AND INTRAOCULAR LENS PLACEMENT ([place]);  Surgeon: Leandrew Koyanagi, MD;  Location: [place];  Service: Ophthalmology;  Laterality: Left;  IVA TOPICAL   COLONOSCOPY  2008   OTHER SURGICAL HISTORY  2002   ear surgery   STAPEDES SURGERY Right 2002   Patient Active Problem List   Diagnosis Date Noted   Dysphagia, pharyngeal phase 09/17/2021   Cervicalgia 08/05/2021   Spondylosis without myelopathy or radiculopathy, lumbosacral  region 06/24/2021   Lumbosacral facet hypertrophy 06/03/2021   Lumbar central spinal stenosis, w/o neurogenic claudication (L4-5) 06/03/2021   Lumbar lateral recess stenosis (Bilateral: L2-3, L4-5) (Right: L3-4) 06/03/2021   Lumbosacral foraminal stenosis (Bilateral: L2-3, L3-4) (Left: L5-S1) 06/03/2021   Lumbar nerve root impingement (Right: L3 at L2-3 & L3-4) 06/03/2021   Ligamentum flavum hypertrophy (L3-4, L4-5) 06/03/2021   Abnormal MRI, lumbar spine (04/23/2021) 04/24/2021   Long term prescription benzodiazepine use (alprazolam) (Xanax) 03/24/2021   Grade 1 Retrolisthesis of L2/L3 (5 mm) and L3/L4 (3 mm) 03/24/2021   Levoscoliosis of lumbar spine (L3-4 apex) 03/24/2021   DDD (degenerative disc disease), lumbosacral 03/24/2021   Lumbosacral facet arthropathy (Left: L3-4, L4-5, and L5-S1) 03/24/2021   Tricompartment osteoarthritis of knee (Left) 03/24/2021   Baker cyst (Left) 03/24/2021   Chronic low back pain (1ry area of Pain) (Bilateral) (R>L) w/o sciatica 03/24/2021   Lumbar facet syndrome (Bilateral) 03/24/2021   Chronic lower extremity pain (2ry area of Pain) (Bilateral) (L>R) 03/24/2021   Lumbosacral radiculitis/sensory radiculopathy at L2 (Bilateral) 03/24/2021   Lumbosacral radiculitis/sensory radiculopathy at L3 (Bilateral) 03/24/2021   Chronic pain syndrome 03/23/2021   Pharmacologic therapy 03/23/2021   Disorder of skeletal system 03/23/2021   Problems influencing health status 03/23/2021   PAD (peripheral artery disease) ([place]) 10/01/2020  GAD (generalized anxiety disorder) 01/13/2018   MDD (major depressive disorder) 41/66/0630   Umbilical hernia 16/03/930   Chronic knee pain (Left) 08/11/2017   Derangement of medial meniscus, posterior horn (Left) 08/11/2017   Vitamin D insufficiency 03/05/2016   BPH without obstruction/lower urinary tract symptoms 07/08/2015   Chronic fatigue 10/23/2014   Parkinson's disease ([place]) 09/20/2014   Major depression, recurrent, full  remission ([place]) 07/26/2013   Unsteady gait 07/26/2013   Orthostatic hypotension 07/26/2013   Depression 07/26/2013   Anxiety 06/23/2013   Chronic anxiety 06/23/2013   Tremor 05/18/2013   Bilateral carotid artery stenosis 08/30/2012    ONSET DATE: 08/11/2021  REFERRING DIAG: Parkinsons Disease  THERAPY DIAG:  Difficulty in walking, not elsewhere classified  Abnormality of gait and mobility  Unsteadiness on feet  Other abnormalities of gait and mobility  Muscle weakness (generalized)  Rationale for Evaluation and Treatment Rehabilitation  SUBJECTIVE:                                                                                                                                                                                              SUBJECTIVE STATEMENT: Patient reports his back is bothering him today. Reports no falls or LOB since last session.   Pt accompanied by: significant other  PERTINENT HISTORY: Per chart history provided by Dr. Consuela Mimes- Mr. Kmetz has Parkinson's disease ([place]); Chronic knee pain (Left); Derangement of medial meniscus, posterior horn (Left); PAD (peripheral artery disease) ([place]); Chronic pain syndrome; Grade 1 Retrolisthesis of L2/L3 (5 mm) and L3/L4 (3 mm); Levoscoliosis of lumbar spine (L3-4 apex); DDD (degenerative disc disease), lumbosacral; Lumbosacral facet arthropathy (Left: L3-4, L4-5, and L5-S1); Tricompartment osteoarthritis of knee (Left); Baker cyst (Left); Chronic low back pain (1ry area of Pain) (Bilateral) (R>L) w/o sciatica; Lumbar facet syndrome (Bilateral); Chronic lower extremity pain (2ry area of Pain) (Bilateral) (L>R); Lumbosacral radiculitis/sensory radiculopathy at L2 (Bilateral); Lumbosacral radiculitis/sensory radiculopathy at L3 (Bilateral); Abnormal MRI, lumbar spine (04/23/2021); Lumbosacral facet hypertrophy; Lumbar central spinal stenosis, w/o neurogenic claudication (L4-5); Lumbar lateral recess stenosis (Bilateral: L2-3,  L4-5) (Right: L3-4); Lumbosacral foraminal stenosis (Bilateral: L2-3, L3-4) (Left: L5-S1); Lumbar nerve root impingement (Right: L3 at L2-3 & L3-4); Ligamentum flavum hypertrophy (L3-4, L4-5); Spondylosis without myelopathy or radiculopathy, lumbosacral region; and Cervicalgia on their pertinent problem list   Patient has appointment for Epidural to low back next Tues 08/26/2021  PAIN:  Are you having pain? Yes: NPRS scale: 8/10 Pain location: low back Pain description: ache/soreness Aggravating factors: prolonged standing, transfers, walking Relieving factors: rest and medications  PRECAUTIONS: Fall  WEIGHT BEARING RESTRICTIONS No  FALLS: Has patient fallen in last  6 months? Yes. Number of falls 2  LIVING ENVIRONMENT: Lives with: lives with their spouse Lives in: House/apartment Stairs: 4 Has following equipment at home: Walker - 2 wheeled  PLOF: Needs assistance with ADLs- intermittent  PATIENT GOALS  to walk better and be as active as possible. Also to get in/out of cars better.   OBJECTIVE: (objective measures completed at initial evaluation unless otherwise dated)   DIAGNOSTIC FINDINGS: IMPRESSION: 1. Severe lumbar levoscoliosis with widespread advanced disc and facet degeneration. 2. Mild spinal stenosis at L4-5. 3. Moderate neural foraminal stenosis on the right at L3-4 and on the left at L5-S1. 4. Moderate right lateral recess stenosis at L2-3.      LOWER EXTREMITY MMT:    MMT (evaluation)  Right Eval Left Eval  Hip flexion 4 4  Hip extension 4 4  Hip abduction 4 4  Hip adduction 4 4  Hip internal rotation 4 4  Hip external rotation 4 4  Knee flexion 4 4  Knee extension 4 4  Ankle dorsiflexion 4 4  Ankle plantarflexion 4 4  Ankle inversion 4 4  Ankle eversion 4 4  (Blank rows = not tested)     FUNCTIONAL TESTS (EVALUATION):  5 times sit to stand: 14 sec without UE support Timed up and go (TUG): 15.67 sec with RW 10 meter walk test: 10.67 sec =  0.94 m/s using RW Berg Balance Scale: 48/56- see flowsheet for details  PATIENT SURVEYS:  FOTO : 49 8/24: 47  TODAY'S TREATMENT 10/23/21 Physical therapy treatment session today consisted of completing assessment of goals and administration of testing as demonstrated in flow sheet. Addition treatments may be found below.   [facility] PT Assessment - 10/23/21 0001       Berg Balance Test   Sit to Stand Able to stand without using hands and stabilize independently    Standing Unsupported Able to stand safely 2 minutes    Sitting with Back Unsupported but Feet Supported on Floor or Stool Able to sit safely and securely 2 minutes    Stand to Sit Sits safely with minimal use of hands    Transfers Able to transfer safely, minor use of hands    Standing Unsupported with Eyes Closed Able to stand 10 seconds safely    Standing Unsupported with Feet Together Able to place feet together independently and stand 1 minute safely    From Standing, Reach Forward with Outstretched Arm Can reach forward >12 cm safely (5")    From Standing Position, Pick up Object from Floor Able to pick up shoe, needs supervision    From Standing Position, Turn to Look Behind Over each Shoulder Looks behind from both sides and weight shifts well    Turn 360 Degrees Able to turn 360 degrees safely one side only in 4 seconds or less    Standing Unsupported, Alternately Place Feet on Step/Stool Able to complete 4 steps without aid or supervision    Standing Unsupported, One Foot in Front Able to plae foot ahead of the other independently and hold 30 seconds    Standing on One Leg Tries to lift leg/unable to hold 3 seconds but remains standing independently    Total Score 47            5XSTS: 10.56 TUG: 14 sec  10MWT: 1.23ms   PATIENT EDUCATION: Education details: need to monitor BP closely  Person educated: Patient Education method: Explanation, Demonstration, Tactile cues, and Verbal cues Education comprehension:  verbalized understanding,  returned demonstration, verbal cues required, tactile cues required, and needs further education   HOME EXERCISE PROGRAM: PWR! Basic 4 seated x 10 ea, handout provided: addended to not perform seated PRW! Rock exercise and added the below to program on 09/25/21  Access Code: [email or web address] URL: [email or web address] Date: 09/25/2021 Prepared by: Rivka Barbara  Exercises - Standing Romberg to 3/4 Tandem Stance  - 1 x daily - 7 x weekly - 2 sets - 30 seocnd hold - Standing March with Counter Support  - 1 x daily - 7 x weekly - 2 sets - 10 reps - Modified Single Leg Balance on Step  - 1 x daily - 7 x weekly - 2 sets - 30 second hold  Access Code: [email or web address] URL: [email or web address] Date: 10/21/2021 Prepared by: Janna Arch  Exercises - Seated Abdominal Press into [employer]  - 1 x daily - 7 x weekly - 2 sets - 10 reps - 5 hold - Seated Flexion Stretch with Swiss Ball  - 1 x daily - 7 x weekly - 2 sets - 10 reps - 5 hold  GOALS: Goals reviewed with patient? Yes  SHORT TERM GOALS: Target date: 10/02/2021  Pt will be independent with initial HEP in order to improve strength and balance in order to decrease fall risk and improve function at home and work.  Baseline: 08/21/2021-No formal HEP in place Goal status: MET    LONG TERM GOALS: Target date: 9/14//2023  Pt will improve BERG by at least 3 points in order to demonstrate clinically significant improvement in balance.    Baseline: 08/21/2021= 48/56 8/24:47/56 Goal status: INITIAL  2.  Pt will be independent with final HEP in order to improve strength and balance in order to decrease fall risk and improve function at home and work.  Baseline: No HEP in place, 8/24: completing current HEP but still progressing  Goal status: IN PROGRESS  3.  Pt will decrease 5TSTS by at least 4 seconds in order to demonstrate clinically significant improvement in LE strength. Baseline: 08/21/2021=  19.01 sec without UE support 8/24:10.56 sec Goal status: MET  4.  Pt will decrease TUG to below 14 seconds/decrease in order to demonstrate decreased fall risk. Baseline:  08/21/2021=15.67 with RW 8/24: 12.41 Goal status: MET  5.  Pt will improve FOTO to target score of 56 to display perceived improvements in ability to complete ADL's.  Baseline: 08/21/2021=49 8/24: 45.6 Goal status: IN PROGRESS  6.  Pt will increase 10MWT by at least 0.15 m/s in order to demonstrate clinically significant improvement in community ambulation.   Baseline: 08/22/2021=0.94 m/s 8/24:1.75ms Goal status: MET  ASSESSMENT:  CLINICAL IMPRESSION: Patient presents for her physical therapy for progress note this date.  Patient reassessed with several of his goals and patient is making great progress toward his goals.  Only goal patient did not make progress toward with Berg balance test this is likely due to patient rushing through some of the components of the test.  Patient has met his goal for his 10 m walk test, timed up and go test, and 5 times sit to stand indicating decreased risk of falls.Pt will continue to benefit from skilled physical therapy intervention to address impairments, improve QOL, and attain therapy goals.   .    OBJECTIVE IMPAIRMENTS Abnormal gait, decreased activity tolerance, decreased balance, decreased coordination, decreased endurance, decreased knowledge of use of DME, decreased mobility, difficulty walking, decreased ROM, decreased strength, hypomobility, and impaired flexibility.   ACTIVITY LIMITATIONS carrying, lifting, bending,  standing, squatting, sleeping, stairs, transfers, continence, bathing, toileting, and dressing  PARTICIPATION LIMITATIONS: cleaning, laundry, medication management, driving, shopping, community activity, and yard work  PERSONAL FACTORS Age are also affecting patient's functional outcome.   REHAB POTENTIAL: Good  CLINICAL DECISION MAKING:  Stable/uncomplicated  EVALUATION COMPLEXITY: Low  PLAN: PT FREQUENCY: 2x/week  PT DURATION: 12 weeks  PLANNED INTERVENTIONS: Therapeutic exercises, Therapeutic activity, Neuromuscular re-education, Balance training, Gait training, Patient/Family education, Joint mobilization, Stair training, Vestibular training, Canalith repositioning, DME instructions, Dry Needling, Electrical stimulation, Spinal manipulation, and Spinal mobilization  PLAN FOR NEXT SESSION: continue balance training, monitor hypotension and orthostatics, ROM for flexibility,    Particia Lather PT  10/23/21, 4:24 PM

## 2021-10-28 ENCOUNTER — Ambulatory Visit: Payer: Medicare HMO

## 2021-10-28 ENCOUNTER — Ambulatory Visit: Payer: Medicare HMO | Admitting: Occupational Therapy

## 2021-10-28 ENCOUNTER — Encounter: Payer: Medicare HMO | Admitting: Speech Pathology

## 2021-10-28 DIAGNOSIS — R269 Unspecified abnormalities of gait and mobility: Secondary | ICD-10-CM | POA: Diagnosis not present

## 2021-10-28 DIAGNOSIS — M6281 Muscle weakness (generalized): Secondary | ICD-10-CM

## 2021-10-28 DIAGNOSIS — R262 Difficulty in walking, not elsewhere classified: Secondary | ICD-10-CM | POA: Diagnosis not present

## 2021-10-28 DIAGNOSIS — R2689 Other abnormalities of gait and mobility: Secondary | ICD-10-CM

## 2021-10-28 DIAGNOSIS — R278 Other lack of coordination: Secondary | ICD-10-CM | POA: Diagnosis not present

## 2021-10-28 DIAGNOSIS — R2681 Unsteadiness on feet: Secondary | ICD-10-CM | POA: Diagnosis not present

## 2021-10-28 DIAGNOSIS — R471 Dysarthria and anarthria: Secondary | ICD-10-CM | POA: Diagnosis not present

## 2021-10-28 DIAGNOSIS — R490 Dysphonia: Secondary | ICD-10-CM | POA: Diagnosis not present

## 2021-10-28 DIAGNOSIS — G20 Parkinson's disease: Secondary | ICD-10-CM | POA: Diagnosis not present

## 2021-10-28 NOTE — Therapy (Signed)
OUTPATIENT OCCUPATIONAL THERAPY NEURO TREATMENT NOTE  Patient Name: Joseph Arroyo MRN: [id number] DOB:1940-08-29, 81 y.o., male Today's Date: 10/28/2021  PCP: Dr. Nobie Putnam REFERRING PROVIDER: Dr. Lavena Bullion   OT End of Session - 10/28/21 1116     Visit Number 12    Number of Visits 24    Date for OT Re-Evaluation 11/11/21    Authorization Time Period Reporting period beginning 08/19/21    OT Start Time 1015    OT Stop Time 1100    OT Time Calculation (min) 45 min    Activity Tolerance Patient tolerated treatment well    Behavior During Therapy [facility] for tasks assessed/performed              Past Medical History:  Diagnosis Date   Allergic rhinitis due to pollen 11/21/2007   Brachial neuritis or radiculitis NOS    Cervicalgia    Concussion    age 27 - s/p accident   Costal chondritis    Depression    Essential and other specified forms of tremor    GERD (gastroesophageal reflux disease)    in past   Lumbago    Occlusion and stenosis of carotid artery without mention of cerebral infarction    Seizures [facility])    age 31 - after concussion   Past Surgical History:  Procedure Laterality Date   CATARACT EXTRACTION Right 07/18/14   CATARACT EXTRACTION W/PHACO Left 08/01/2014   Procedure: CATARACT EXTRACTION PHACO AND INTRAOCULAR LENS PLACEMENT ([place]);  Surgeon: Leandrew Koyanagi, MD;  Location: [place];  Service: Ophthalmology;  Laterality: Left;  IVA TOPICAL   COLONOSCOPY  2008   OTHER SURGICAL HISTORY  2002   ear surgery   STAPEDES SURGERY Right 2002   Patient Active Problem List   Diagnosis Date Noted   Dysphagia, pharyngeal phase 09/17/2021   Cervicalgia 08/05/2021   Spondylosis without myelopathy or radiculopathy, lumbosacral region 06/24/2021   Lumbosacral facet hypertrophy 06/03/2021   Lumbar central spinal stenosis, w/o neurogenic claudication (L4-5) 06/03/2021   Lumbar lateral recess stenosis (Bilateral: L2-3, L4-5) (Right:  L3-4) 06/03/2021   Lumbosacral foraminal stenosis (Bilateral: L2-3, L3-4) (Left: L5-S1) 06/03/2021   Lumbar nerve root impingement (Right: L3 at L2-3 & L3-4) 06/03/2021   Ligamentum flavum hypertrophy (L3-4, L4-5) 06/03/2021   Abnormal MRI, lumbar spine (04/23/2021) 04/24/2021   Long term prescription benzodiazepine use (alprazolam) (Xanax) 03/24/2021   Grade 1 Retrolisthesis of L2/L3 (5 mm) and L3/L4 (3 mm) 03/24/2021   Levoscoliosis of lumbar spine (L3-4 apex) 03/24/2021   DDD (degenerative disc disease), lumbosacral 03/24/2021   Lumbosacral facet arthropathy (Left: L3-4, L4-5, and L5-S1) 03/24/2021   Tricompartment osteoarthritis of knee (Left) 03/24/2021   Baker cyst (Left) 03/24/2021   Chronic low back pain (1ry area of Pain) (Bilateral) (R>L) w/o sciatica 03/24/2021   Lumbar facet syndrome (Bilateral) 03/24/2021   Chronic lower extremity pain (2ry area of Pain) (Bilateral) (L>R) 03/24/2021   Lumbosacral radiculitis/sensory radiculopathy at L2 (Bilateral) 03/24/2021   Lumbosacral radiculitis/sensory radiculopathy at L3 (Bilateral) 03/24/2021   Chronic pain syndrome 03/23/2021   Pharmacologic therapy 03/23/2021   Disorder of skeletal system 03/23/2021   Problems influencing health status 03/23/2021   PAD (peripheral artery disease) ([place]) 10/01/2020   GAD (generalized anxiety disorder) 01/13/2018   MDD (major depressive disorder) 29/52/8413   Umbilical hernia 24/40/1027   Chronic knee pain (Left) 08/11/2017   Derangement of medial meniscus, posterior horn (Left) 08/11/2017   Vitamin D insufficiency 03/05/2016   BPH without obstruction/lower urinary tract  symptoms 07/08/2015   Chronic fatigue 10/23/2014   Parkinson's disease ([place]) 09/20/2014   Major depression, recurrent, full remission ([place]) 07/26/2013   Unsteady gait 07/26/2013   Orthostatic hypotension 07/26/2013   Depression 07/26/2013   Anxiety 06/23/2013   Chronic anxiety 06/23/2013   Tremor 05/18/2013   Bilateral  carotid artery stenosis 08/30/2012    ONSET DATE: 08/11/21 date of referral.  PD symptoms since 2015.  REFERRING DIAG: Parkinson's Disease  THERAPY DIAG:  Muscle weakness (generalized)  Rationale for Evaluation and Treatment Rehabilitation  SUBJECTIVE:   SUBJECTIVE STATEMENT: Pt. has an eye procedure coming up on 11/06/21 where the doctor will be cleaning one of his lenses.    PERTINENT HISTORY: Per chart, pt with hx of Parkinson's disease ([place]); Chronic knee pain (Left); Derangement of medial meniscus, posterior horn (Left); PAD (peripheral artery disease) ([place]); Chronic pain syndrome; DDD (degenerative disc disease) lumbosacral; osteoarthritis of knee (Left); Chronic low back pain, Lumbar facet syndrome (Bilateral); Chronic lower extremity pain. PRECAUTIONS: Fall  WEIGHT BEARING RESTRICTIONS No  PAIN:  Are you having pain? Yes: NPRS scale: 4-5/10 Pain location: low back Pain description: achy Aggravating factors: N/A, constant Relieving factors: rest, repositioning  PATIENT GOALS : Increase efficiency with daily tasks, increase FMC skills   OBJECTIVE:  HAND FUNCTION: Grip strength: Right: 48 lbs; Left: 50 lbs, Lateral pinch: Right: 12 lbs, Left: 10 lbs, and 3 point pinch: Right: 11 lbs, Left: 9 lbs   COORDINATION: 9 Hole Peg test: Right: 33 sec; Left: 32 sec  TODAY'S TREATMENT:   Neuromusucular re-education:  Pt. worked on bilateral [facility] skills manipulating nuts, and bolts on a bolt board. Pt. worked on screwing, and unscrewing nuts, and bolts of varying sizes, and challenging progressively smaller items with vision occluded.  Pt. worked on right [facility] skills grasping 1" sticks, 1/4" collars, and 1/4" washers. Pt. worked on storing the objects in the palm, and translatory skills moving the items from the palm of the hand to the tip of the 2nd digit, and thumb. Pt. worked on removing the pegs using bilateral alternating hand patterns.    PATIENT EDUCATION: Education details:  fall prevention strategies  Person educated: Patient and Spouse Education method: Explanation and Verbal cues Education comprehension: verbalized understanding, returned demo   HOME EXERCISE PROGRAM:  BUE strengthening with yellow/red theraband   GOALS: Goals reviewed with patient? Yes  SHORT TERM GOALS: Target date: 09/30/21  Pt will be indep to perform HEP for increasing strength and coordination in B hands.  Baseline: HEP not yet initiated; 10th: pt performs yellow theraband and theraputty exercises regularly and independently Goal status: ongoing  2.  Pt will be indep to verbalize 3 fall prevention strategies to implement during ADLs.  Baseline: Not yet initiated; 10th: Pt is able to verbalize 3+ strategies for fall prevention (ie consistent use of walker, slow positional changes, removal of throw rugs, keep pathways clear)  Goal status: met   3 LONG TERM GOALS: 11/11/21  Pt will increase FOTO score by 3 or more points to indicate increased perceived functional performance with daily tasks.  Baseline: FOTO 60; 10th: will complete next session (therapist's computer unavailable) Goal status: INITIAL  2.  Pt will be able to efficiently send short text messages to family/friends with modified techniques as needed (ie: use of stylus, voice to text) Baseline: Spouse sends texts for pt; 10th: pt started practicing with use of stylus and will continue to assess if efficiency is better with or without; pt requires reminders to use  predictive texting  Goal status: ongoing  3.  Pt will fill out a check for bill paying with 90% legibility with adapted pen as needed. Baseline: Pt reports inconsistent legibility; 100% legibility with compensatory strategies: Forearm support on table top, postural corrections, wide grip pen Goal status: met  4.  Pt will increase efficiency with dressing tasks, donning clothes in 15 min or less. Baseline: Spouse reports it can take pt 20-30 min to dress in  the morning Goal status: ongoing    ASSESSMENT:  CLINICAL IMPRESSION:   Pt. was able to efficiently manipulate, disconnect, and reconnect all sizes of washers with the right hand. Pt. Had difficulty, and required increased time disconnecting, and reconnecting the smallest washer with his left hand. Pt. continues to require verbal cues, tactile cues, and cues for visual demonstration for proper [place] technique. Pt. Utilized strategies for proximal stabilization for more distal control. Pt. required elbow, and forearm support for more stability. Pt./caregiver education was provided opportunities to perform similar [facility] tasks at home. Pt continues to work on improving activity tolerance, [facility] skills, and use of compensatory strategies for more efficient texting to enable better communication with [place]. Pt will continue to benefit from skilled OT to work towards remaining goals in [place] for maximizing independence and activity tolerance with ADLs, and IADL tasks.     PERFORMANCE DEFICITS in functional skills including ADLs, IADLs, coordination, dexterity, ROM, strength, pain, flexibility, FMC, and GMC, cognitive skills including safety awareness.  IMPAIRMENTS are limiting patient from ADLs, IADLs, and leisure.   COMORBIDITIES has co-morbidities such as scoliosis, chronic back pain and leg pain  that affects occupational performance. Patient will benefit from skilled OT to address above impairments and improve overall function.  MODIFICATION OR ASSISTANCE TO COMPLETE EVALUATION: No modification of tasks or assist necessary to complete an evaluation.  OT OCCUPATIONAL PROFILE AND HISTORY: Problem focused assessment: Including review of records relating to presenting problem.  CLINICAL DECISION MAKING: Moderate - several treatment options, min-mod task modification necessary  REHAB POTENTIAL: Good  EVALUATION COMPLEXITY: Moderate    PLAN: OT FREQUENCY: 2x/week  OT DURATION: 12  weeks  PLANNED INTERVENTIONS: self care/ADL training, therapeutic exercise, therapeutic activity, neuromuscular re-education, manual therapy, passive range of motion, balance training, functional mobility training, moist heat, cryotherapy, patient/family education, cognitive remediation/compensation, energy conservation, coping strategies training, and DME and/or AE instructions  RECOMMENDED OTHER SERVICES: N/A  CONSULTED AND AGREED WITH PLAN OF CARE: Patient and family member/caregiver  PLAN FOR NEXT SESSION: See above   Harrel Carina, MS, OTR/L   Harrel Carina, OT 10/28/2021, 11:18 AM

## 2021-10-28 NOTE — Therapy (Signed)
OUTPATIENT PHYSICAL THERAPY NEURO    Patient Name: Joseph Arroyo MRN: [id number] DOB:1940/04/21, 81 y.o., male Today's Date: 10/28/2021   PCP: Olin Hauser, DO REFERRING PROVIDER: Lavena Bullion, NP   PT End of Session - 10/28/21 1102     Visit Number 11    Number of Visits 25    Date for PT Re-Evaluation 11/13/21    Authorization Type [employer]    Authorization Time Period Initial Cert 3/76/2831- 07/17/6158    Progress Note Due on Visit 10    PT Start Time 1103    PT Stop Time 1145    PT Time Calculation (min) 42 min    Equipment Utilized During Treatment Gait belt    Activity Tolerance Patient tolerated treatment well    Behavior During Therapy WFL for tasks assessed/performed                  Past Medical History:  Diagnosis Date   Allergic rhinitis due to pollen 11/21/2007   Brachial neuritis or radiculitis NOS    Cervicalgia    Concussion    age 52 - s/p accident   Costal chondritis    Depression    Essential and other specified forms of tremor    GERD (gastroesophageal reflux disease)    in past   Lumbago    Occlusion and stenosis of carotid artery without mention of cerebral infarction    Seizures [facility])    age 31 - after concussion   Past Surgical History:  Procedure Laterality Date   CATARACT EXTRACTION Right 07/18/14   CATARACT EXTRACTION W/PHACO Left 08/01/2014   Procedure: CATARACT EXTRACTION PHACO AND INTRAOCULAR LENS PLACEMENT ([place]);  Surgeon: Leandrew Koyanagi, MD;  Location: [place];  Service: Ophthalmology;  Laterality: Left;  IVA TOPICAL   COLONOSCOPY  2008   OTHER SURGICAL HISTORY  2002   ear surgery   STAPEDES SURGERY Right 2002   Patient Active Problem List   Diagnosis Date Noted   Dysphagia, pharyngeal phase 09/17/2021   Cervicalgia 08/05/2021   Spondylosis without myelopathy or radiculopathy, lumbosacral region 06/24/2021   Lumbosacral facet hypertrophy 06/03/2021   Lumbar central spinal  stenosis, w/o neurogenic claudication (L4-5) 06/03/2021   Lumbar lateral recess stenosis (Bilateral: L2-3, L4-5) (Right: L3-4) 06/03/2021   Lumbosacral foraminal stenosis (Bilateral: L2-3, L3-4) (Left: L5-S1) 06/03/2021   Lumbar nerve root impingement (Right: L3 at L2-3 & L3-4) 06/03/2021   Ligamentum flavum hypertrophy (L3-4, L4-5) 06/03/2021   Abnormal MRI, lumbar spine (04/23/2021) 04/24/2021   Long term prescription benzodiazepine use (alprazolam) (Xanax) 03/24/2021   Grade 1 Retrolisthesis of L2/L3 (5 mm) and L3/L4 (3 mm) 03/24/2021   Levoscoliosis of lumbar spine (L3-4 apex) 03/24/2021   DDD (degenerative disc disease), lumbosacral 03/24/2021   Lumbosacral facet arthropathy (Left: L3-4, L4-5, and L5-S1) 03/24/2021   Tricompartment osteoarthritis of knee (Left) 03/24/2021   Baker cyst (Left) 03/24/2021   Chronic low back pain (1ry area of Pain) (Bilateral) (R>L) w/o sciatica 03/24/2021   Lumbar facet syndrome (Bilateral) 03/24/2021   Chronic lower extremity pain (2ry area of Pain) (Bilateral) (L>R) 03/24/2021   Lumbosacral radiculitis/sensory radiculopathy at L2 (Bilateral) 03/24/2021   Lumbosacral radiculitis/sensory radiculopathy at L3 (Bilateral) 03/24/2021   Chronic pain syndrome 03/23/2021   Pharmacologic therapy 03/23/2021   Disorder of skeletal system 03/23/2021   Problems influencing health status 03/23/2021   PAD (peripheral artery disease) ([place]) 10/01/2020   GAD (generalized anxiety disorder) 01/13/2018   MDD (major depressive disorder) 73/71/0626   Umbilical hernia  09/04/2017   Chronic knee pain (Left) 08/11/2017   Derangement of medial meniscus, posterior horn (Left) 08/11/2017   Vitamin D insufficiency 03/05/2016   BPH without obstruction/lower urinary tract symptoms 07/08/2015   Chronic fatigue 10/23/2014   Parkinson's disease ([place]) 09/20/2014   Major depression, recurrent, full remission ([place]) 07/26/2013   Unsteady gait 07/26/2013   Orthostatic hypotension  07/26/2013   Depression 07/26/2013   Anxiety 06/23/2013   Chronic anxiety 06/23/2013   Tremor 05/18/2013   Bilateral carotid artery stenosis 08/30/2012    ONSET DATE: 08/11/2021  REFERRING DIAG: Parkinsons Disease  THERAPY DIAG:  Muscle weakness (generalized)  Difficulty in walking, not elsewhere classified  Abnormality of gait and mobility  Unsteadiness on feet  Other abnormalities of gait and mobility  Rationale for Evaluation and Treatment Rehabilitation  SUBJECTIVE:                                                                                                                                                                                              SUBJECTIVE STATEMENT: Patient reports his back is about 5/10 NPS. No falls.   Pt accompanied by: significant other  PERTINENT HISTORY: Per chart history provided by Dr. Consuela Mimes- Mr. Pulse has Parkinson's disease ([place]); Chronic knee pain (Left); Derangement of medial meniscus, posterior horn (Left); PAD (peripheral artery disease) ([place]); Chronic pain syndrome; Grade 1 Retrolisthesis of L2/L3 (5 mm) and L3/L4 (3 mm); Levoscoliosis of lumbar spine (L3-4 apex); DDD (degenerative disc disease), lumbosacral; Lumbosacral facet arthropathy (Left: L3-4, L4-5, and L5-S1); Tricompartment osteoarthritis of knee (Left); Baker cyst (Left); Chronic low back pain (1ry area of Pain) (Bilateral) (R>L) w/o sciatica; Lumbar facet syndrome (Bilateral); Chronic lower extremity pain (2ry area of Pain) (Bilateral) (L>R); Lumbosacral radiculitis/sensory radiculopathy at L2 (Bilateral); Lumbosacral radiculitis/sensory radiculopathy at L3 (Bilateral); Abnormal MRI, lumbar spine (04/23/2021); Lumbosacral facet hypertrophy; Lumbar central spinal stenosis, w/o neurogenic claudication (L4-5); Lumbar lateral recess stenosis (Bilateral: L2-3, L4-5) (Right: L3-4); Lumbosacral foraminal stenosis (Bilateral: L2-3, L3-4) (Left: L5-S1); Lumbar nerve root impingement  (Right: L3 at L2-3 & L3-4); Ligamentum flavum hypertrophy (L3-4, L4-5); Spondylosis without myelopathy or radiculopathy, lumbosacral region; and Cervicalgia on their pertinent problem list   Patient has appointment for Epidural to low back next Tues 08/26/2021  PAIN:  Are you having pain? Yes: NPRS scale: 5/10 Pain location: low back Pain description: ache/soreness Aggravating factors: prolonged standing, transfers, walking Relieving factors: rest and medications  PRECAUTIONS: Fall  WEIGHT BEARING RESTRICTIONS No  FALLS: Has patient fallen in last 6 months? Yes. Number of falls 2  LIVING ENVIRONMENT: Lives with: lives with their spouse Lives in: House/apartment Stairs: 4 Has  following equipment at home: Gilford Rile - 2 wheeled  PLOF: Needs assistance with ADLs- intermittent  PATIENT GOALS  to walk better and be as active as possible. Also to get in/out of cars better.   OBJECTIVE: (objective measures completed at initial evaluation unless otherwise dated)   DIAGNOSTIC FINDINGS: IMPRESSION: 1. Severe lumbar levoscoliosis with widespread advanced disc and facet degeneration. 2. Mild spinal stenosis at L4-5. 3. Moderate neural foraminal stenosis on the right at L3-4 and on the left at L5-S1. 4. Moderate right lateral recess stenosis at L2-3.      LOWER EXTREMITY MMT:    MMT (evaluation)  Right Eval Left Eval  Hip flexion 4 4  Hip extension 4 4  Hip abduction 4 4  Hip adduction 4 4  Hip internal rotation 4 4  Hip external rotation 4 4  Knee flexion 4 4  Knee extension 4 4  Ankle dorsiflexion 4 4  Ankle plantarflexion 4 4  Ankle inversion 4 4  Ankle eversion 4 4  (Blank rows = not tested)     FUNCTIONAL TESTS (EVALUATION):  5 times sit to stand: 14 sec without UE support Timed up and go (TUG): 15.67 sec with RW 10 meter walk test: 10.67 sec = 0.94 m/s using RW Berg Balance Scale: 48/56- see flowsheet for details  PATIENT SURVEYS:  FOTO : 49 8/24:  47  TODAY'S TREATMENT 10/28/21  Completed Mini- BESTest. Intermittent need for seated rest with MH at lumbar spine due to LBP aggravation with standing and walking tasks. Scoring a 16/28 total. Inability to perform reactive postural control sections limiting score. Unsure if pt unable to trust PT to fully lean to initiate stepping task or had a hard time understanding instruction. Will perform next session and update score as appropriate.      [facility] PT Assessment - 10/28/21 1443       Standardized Balance Assessment   Standardized Balance Assessment Mini-BESTest      Mini-BESTest   Sit To Stand Normal: Comes to stand without use of hands and stabilizes independently.    Rise to Toes Moderate: Heels up, but not full range (smaller than when holding hands), OR noticeable instability for 3 s.    Stand on one leg (left) Moderate: < 20 s    Stand on one leg (right) Severe: Unable    Stand on one leg - lowest score 0    Compensatory Stepping Correction - Forward No step, OR would fall if not caught, OR falls spontaneously.    Compensatory Stepping Correction - Backward No step, OR would fall if not caught, OR falls spontaneously.    Compensatory Stepping Correction - Left Lateral Severe: Falls, or cannot step    Compensatory Stepping Correction - Right Lateral Severe:  Falls, or cannot step    Stepping Corredtion Lateral - lowest score 0    Stance - Feet together, eyes open, firm surface  Normal: 30s    Stance - Feet together, eyes closed, foam surface  Normal: 30s    Incline - Eyes Closed Normal: Stands independently 30s and aligns with gravity    Change in Gait Speed Moderate: Unable to change walking speed or signs of imbalance    Walk with head turns - Horizontal Moderate: performs head turns with reduction in gait speed.    Walk with pivot turns Normal: Turns with feet close FAST (< 3 steps) with good balance.    Step over obstacles Normal: Able to step over box with minimal change of  gait speed and with good balance.    Timed UP & GO with Dual Task Moderate: Dual Task affects either counting OR walking (>10%) when compared to the TUG without Dual Task.    Mini-BEST total score 16             PATIENT EDUCATION: Education details: need to monitor BP closely  Person educated: Patient Education method: Explanation, Demonstration, Tactile cues, and Verbal cues Education comprehension: verbalized understanding, returned demonstration, verbal cues required, tactile cues required, and needs further education   HOME EXERCISE PROGRAM: PWR! Basic 4 seated x 10 ea, handout provided: addended to not perform seated PRW! Rock exercise and added the below to program on 09/25/21  Access Code: [email or web address] URL: [email or web address] Date: 09/25/2021 Prepared by: Rivka Barbara  Exercises - Standing Romberg to 3/4 Tandem Stance  - 1 x daily - 7 x weekly - 2 sets - 30 seocnd hold - Standing March with Counter Support  - 1 x daily - 7 x weekly - 2 sets - 10 reps - Modified Single Leg Balance on Step  - 1 x daily - 7 x weekly - 2 sets - 30 second hold  Access Code: [email or web address] URL: [email or web address] Date: 10/21/2021 Prepared by: Janna Arch  Exercises - Seated Abdominal Press into [employer]  - 1 x daily - 7 x weekly - 2 sets - 10 reps - 5 hold - Seated Flexion Stretch with Swiss Ball  - 1 x daily - 7 x weekly - 2 sets - 10 reps - 5 hold  GOALS: Goals reviewed with patient? Yes  SHORT TERM GOALS: Target date: 10/02/2021  Pt will be independent with initial HEP in order to improve strength and balance in order to decrease fall risk and improve function at home and work.  Baseline: 08/21/2021-No formal HEP in place Goal status: MET    LONG TERM GOALS: Target date: 11/13/2021  Pt will improve BERG by at least 3 points in order to demonstrate clinically significant improvement in balance.    Baseline: 08/21/2021= 48/56 8/24:47/56 Goal status:  INITIAL  2.  Pt will be independent with final HEP in order to improve strength and balance in order to decrease fall risk and improve function at home and work.  Baseline: No HEP in place, 8/24: completing current HEP but still progressing  Goal status: IN PROGRESS  3.  Pt will decrease 5TSTS by at least 4 seconds in order to demonstrate clinically significant improvement in LE strength. Baseline: 08/21/2021= 19.01 sec without UE support 8/24:10.56 sec Goal status: MET  4.  Pt will decrease TUG to below 14 seconds/decrease in order to demonstrate decreased fall risk. Baseline:  08/21/2021=15.67 with RW 8/24: 12.41 Goal status: MET  5.  Pt will improve FOTO to target score of 56 to display perceived improvements in ability to complete ADL's.  Baseline: 08/21/2021=49 8/24: 45.6 Goal status: IN PROGRESS  6.  Pt will increase 10MWT by at least 0.15 m/s in order to demonstrate clinically significant improvement in community ambulation.   Baseline: 08/22/2021=0.94 m/s 8/24:1.70ms Goal status: MET  .7 Pt will improve miniBESTest by at least 6 points to demonstrate clinically significant improvement in reduced falls with parkinsonian related deficits.  Baseline: 16/28 Goal Status: INITIAL   ASSESSMENT:  CLINICAL IMPRESSION:  Pt arriving to clinic after progress note last session. Per previous EMR, pt making excellent progress towards previously written goals. Focus of session on performing Mini-BESTest to examine specific parkinsonian related deficits. Seated rest needed during testing  due to aggravation of LBP. Overall pt scoring 16/28 indicating at risk for falls albeit pt unable to initiate reactive postural control. Will re-attempt next session and update goal as appropriate in case pt had hard time following instruction or is truly unable to perform as written in test. Long term goals updated this date. Pt will continue to benefit from skilled physical therapy intervention to address  impairments, improve QOL, and attain therapy goals.      OBJECTIVE IMPAIRMENTS Abnormal gait, decreased activity tolerance, decreased balance, decreased coordination, decreased endurance, decreased knowledge of use of DME, decreased mobility, difficulty walking, decreased ROM, decreased strength, hypomobility, and impaired flexibility.   ACTIVITY LIMITATIONS carrying, lifting, bending, standing, squatting, sleeping, stairs, transfers, continence, bathing, toileting, and dressing  PARTICIPATION LIMITATIONS: cleaning, laundry, medication management, driving, shopping, community activity, and yard work  PERSONAL FACTORS Age are also affecting patient's functional outcome.   REHAB POTENTIAL: Good  CLINICAL DECISION MAKING: Stable/uncomplicated  EVALUATION COMPLEXITY: Low  PLAN: PT FREQUENCY: 2x/week  PT DURATION: 12 weeks  PLANNED INTERVENTIONS: Therapeutic exercises, Therapeutic activity, Neuromuscular re-education, Balance training, Gait training, Patient/Family education, Joint mobilization, Stair training, Vestibular training, Canalith repositioning, DME instructions, Dry Needling, Electrical stimulation, Spinal manipulation, and Spinal mobilization  PLAN FOR NEXT SESSION: Please reassess reactive postural control on Mini-BESTest, continue balance training, monitor hypotension and orthostatics, ROM for flexibility,    Salem Caster. Fairly IV, PT, DPT Physical Therapist- [facility]  10/28/21, 2:55 PM

## 2021-10-30 ENCOUNTER — Encounter: Payer: Medicare HMO | Admitting: Speech Pathology

## 2021-10-30 ENCOUNTER — Ambulatory Visit: Payer: Medicare HMO | Admitting: Physical Therapy

## 2021-10-30 ENCOUNTER — Ambulatory Visit: Payer: Medicare HMO | Admitting: Occupational Therapy

## 2021-10-30 ENCOUNTER — Encounter: Payer: Self-pay | Admitting: Occupational Therapy

## 2021-10-30 ENCOUNTER — Ambulatory Visit: Payer: Medicare HMO

## 2021-10-30 DIAGNOSIS — R2681 Unsteadiness on feet: Secondary | ICD-10-CM | POA: Diagnosis not present

## 2021-10-30 DIAGNOSIS — R262 Difficulty in walking, not elsewhere classified: Secondary | ICD-10-CM

## 2021-10-30 DIAGNOSIS — R278 Other lack of coordination: Secondary | ICD-10-CM | POA: Diagnosis not present

## 2021-10-30 DIAGNOSIS — M6281 Muscle weakness (generalized): Secondary | ICD-10-CM | POA: Diagnosis not present

## 2021-10-30 DIAGNOSIS — G20 Parkinson's disease: Secondary | ICD-10-CM | POA: Diagnosis not present

## 2021-10-30 DIAGNOSIS — R490 Dysphonia: Secondary | ICD-10-CM | POA: Diagnosis not present

## 2021-10-30 DIAGNOSIS — R269 Unspecified abnormalities of gait and mobility: Secondary | ICD-10-CM

## 2021-10-30 DIAGNOSIS — R2689 Other abnormalities of gait and mobility: Secondary | ICD-10-CM

## 2021-10-30 DIAGNOSIS — R471 Dysarthria and anarthria: Secondary | ICD-10-CM | POA: Diagnosis not present

## 2021-10-30 NOTE — Therapy (Signed)
OUTPATIENT PHYSICAL THERAPY NEURO    Patient Name: Joseph Arroyo MRN: [id number] DOB:August 27, 1940, 81 y.o., male Today's Date: 10/30/2021   PCP: Joseph Hauser, DO REFERRING PROVIDER: Lavena Bullion, NP   PT End of Session - 10/30/21 1520     Visit Number 12    Number of Visits 25    Date for PT Re-Evaluation 11/13/21    Authorization Type [employer]    Authorization Time Period Initial Cert 03/04/7251- 6/64/4034    Progress Note Due on Visit 10    PT Start Time 1519    PT Stop Time 1600    PT Time Calculation (min) 41 min    Equipment Utilized During Treatment Gait belt    Activity Tolerance Patient tolerated treatment well    Behavior During Therapy [facility] for tasks assessed/performed                   Past Medical History:  Diagnosis Date   Allergic rhinitis due to pollen 11/21/2007   Brachial neuritis or radiculitis NOS    Cervicalgia    Concussion    age 73 - s/p accident   Costal chondritis    Depression    Essential and other specified forms of tremor    GERD (gastroesophageal reflux disease)    in past   Lumbago    Occlusion and stenosis of carotid artery without mention of cerebral infarction    Seizures [facility])    age 67 - after concussion   Past Surgical History:  Procedure Laterality Date   CATARACT EXTRACTION Right 07/18/14   CATARACT EXTRACTION W/PHACO Left 08/01/2014   Procedure: CATARACT EXTRACTION PHACO AND INTRAOCULAR LENS PLACEMENT ([place]);  Surgeon: Joseph Koyanagi, MD;  Location: [place];  Service: Ophthalmology;  Laterality: Left;  IVA TOPICAL   COLONOSCOPY  2008   OTHER SURGICAL HISTORY  2002   ear surgery   STAPEDES SURGERY Right 2002   Patient Active Problem List   Diagnosis Date Noted   Dysphagia, pharyngeal phase 09/17/2021   Cervicalgia 08/05/2021   Spondylosis without myelopathy or radiculopathy, lumbosacral region 06/24/2021   Lumbosacral facet hypertrophy 06/03/2021   Lumbar central spinal  stenosis, w/o neurogenic claudication (L4-5) 06/03/2021   Lumbar lateral recess stenosis (Bilateral: L2-3, L4-5) (Right: L3-4) 06/03/2021   Lumbosacral foraminal stenosis (Bilateral: L2-3, L3-4) (Left: L5-S1) 06/03/2021   Lumbar nerve root impingement (Right: L3 at L2-3 & L3-4) 06/03/2021   Ligamentum flavum hypertrophy (L3-4, L4-5) 06/03/2021   Abnormal MRI, lumbar spine (04/23/2021) 04/24/2021   Long term prescription benzodiazepine use (alprazolam) (Xanax) 03/24/2021   Grade 1 Retrolisthesis of L2/L3 (5 mm) and L3/L4 (3 mm) 03/24/2021   Levoscoliosis of lumbar spine (L3-4 apex) 03/24/2021   DDD (degenerative disc disease), lumbosacral 03/24/2021   Lumbosacral facet arthropathy (Left: L3-4, L4-5, and L5-S1) 03/24/2021   Tricompartment osteoarthritis of knee (Left) 03/24/2021   Baker cyst (Left) 03/24/2021   Chronic low back pain (1ry area of Pain) (Bilateral) (R>L) w/o sciatica 03/24/2021   Lumbar facet syndrome (Bilateral) 03/24/2021   Chronic lower extremity pain (2ry area of Pain) (Bilateral) (L>R) 03/24/2021   Lumbosacral radiculitis/sensory radiculopathy at L2 (Bilateral) 03/24/2021   Lumbosacral radiculitis/sensory radiculopathy at L3 (Bilateral) 03/24/2021   Chronic pain syndrome 03/23/2021   Pharmacologic therapy 03/23/2021   Disorder of skeletal system 03/23/2021   Problems influencing health status 03/23/2021   PAD (peripheral artery disease) ([place]) 10/01/2020   GAD (generalized anxiety disorder) 01/13/2018   MDD (major depressive disorder) 74/25/9563   Umbilical  hernia 09/04/2017   Chronic knee pain (Left) 08/11/2017   Derangement of medial meniscus, posterior horn (Left) 08/11/2017   Vitamin D insufficiency 03/05/2016   BPH without obstruction/lower urinary tract symptoms 07/08/2015   Chronic fatigue 10/23/2014   Parkinson's disease ([place]) 09/20/2014   Major depression, recurrent, full remission ([place]) 07/26/2013   Unsteady gait 07/26/2013   Orthostatic hypotension  07/26/2013   Depression 07/26/2013   Anxiety 06/23/2013   Chronic anxiety 06/23/2013   Tremor 05/18/2013   Bilateral carotid artery stenosis 08/30/2012    ONSET DATE: 08/11/2021  REFERRING DIAG: Parkinsons Disease  THERAPY DIAG:  Muscle weakness (generalized)  Difficulty in walking, not elsewhere classified  Abnormality of gait and mobility  Unsteadiness on feet  Other abnormalities of gait and mobility  Rationale for Evaluation and Treatment Rehabilitation  SUBJECTIVE:                                                                                                                                                                                              SUBJECTIVE STATEMENT: Patient reports his back is about 5/10 NPS. No falls.   Pt accompanied by: significant other  PERTINENT HISTORY: Per chart history provided by Joseph Arroyo- Joseph Arroyo has Parkinson's disease ([place]); Chronic knee pain (Left); Derangement of medial meniscus, posterior horn (Left); PAD (peripheral artery disease) ([place]); Chronic pain syndrome; Grade 1 Retrolisthesis of L2/L3 (5 mm) and L3/L4 (3 mm); Levoscoliosis of lumbar spine (L3-4 apex); DDD (degenerative disc disease), lumbosacral; Lumbosacral facet arthropathy (Left: L3-4, L4-5, and L5-S1); Tricompartment osteoarthritis of knee (Left); Baker cyst (Left); Chronic low back pain (1ry area of Pain) (Bilateral) (R>L) w/o sciatica; Lumbar facet syndrome (Bilateral); Chronic lower extremity pain (2ry area of Pain) (Bilateral) (L>R); Lumbosacral radiculitis/sensory radiculopathy at L2 (Bilateral); Lumbosacral radiculitis/sensory radiculopathy at L3 (Bilateral); Abnormal MRI, lumbar spine (04/23/2021); Lumbosacral facet hypertrophy; Lumbar central spinal stenosis, w/o neurogenic claudication (L4-5); Lumbar lateral recess stenosis (Bilateral: L2-3, L4-5) (Right: L3-4); Lumbosacral foraminal stenosis (Bilateral: L2-3, L3-4) (Left: L5-S1); Lumbar nerve root impingement  (Right: L3 at L2-3 & L3-4); Ligamentum flavum hypertrophy (L3-4, L4-5); Spondylosis without myelopathy or radiculopathy, lumbosacral region; and Cervicalgia on their pertinent problem list   Patient has appointment for Epidural to low back next Tues 08/26/2021  PAIN:  Are you having pain? Yes: NPRS scale: 5/10 Pain location: low back Pain description: ache/soreness Aggravating factors: prolonged standing, transfers, walking Relieving factors: rest and medications  PRECAUTIONS: Fall  WEIGHT BEARING RESTRICTIONS No  FALLS: Has patient fallen in last 6 months? Yes. Number of falls 2  LIVING ENVIRONMENT: Lives with: lives with their spouse Lives in: House/apartment Stairs: 4  Has following equipment at home: Gilford Rile - 2 wheeled  PLOF: Needs assistance with ADLs- intermittent  PATIENT GOALS  to walk better and be as active as possible. Also to get in/out of cars better.   OBJECTIVE: (objective measures completed at initial evaluation unless otherwise dated)   DIAGNOSTIC FINDINGS: IMPRESSION: 1. Severe lumbar levoscoliosis with widespread advanced disc and facet degeneration. 2. Mild spinal stenosis at L4-5. 3. Moderate neural foraminal stenosis on the right at L3-4 and on the left at L5-S1. 4. Moderate right lateral recess stenosis at L2-3.      LOWER EXTREMITY MMT:    MMT (evaluation)  Right Eval Left Eval  Hip flexion 4 4  Hip extension 4 4  Hip abduction 4 4  Hip adduction 4 4  Hip internal rotation 4 4  Hip external rotation 4 4  Knee flexion 4 4  Knee extension 4 4  Ankle dorsiflexion 4 4  Ankle plantarflexion 4 4  Ankle inversion 4 4  Ankle eversion 4 4  (Blank rows = not tested)     FUNCTIONAL TESTS (EVALUATION):  5 times sit to stand: 14 sec without UE support Timed up and go (TUG): 15.67 sec with RW 10 meter walk test: 10.67 sec = 0.94 m/s using RW Berg Balance Scale: 48/56- see flowsheet for details  PATIENT SURVEYS:  FOTO : 49 8/24:  47  TODAY'S TREATMENT 10/30/21  Exercise/Activity Sets/ Reps/Time/ Resistance Assistance Charge type Comments    Mini best below  Unless otherwise stated, CGA was provided and gait belt donned in order to ensure pt safety  Neuro re-ed Retested parts of mini Best test per request of previous therapist to ensure accurate score.   1 LE airex, 1 LE on step  2*40 sec ea   Neuro re-ed Cues for upright posture as well as looking in mirror for postural cues  Rocker board ant and post rocking  2 x 15   Neuro re-ed Good form with bilateral upper extremity assist  NBOS on airex with head turns  X 45 sec ea with lateral head turns and superior and inferior head turns.   Neuro re-ed   EC on incline  x 45 seconds   Neuro re-ed Good balance responses with no noticeable loss of balance  Forward and retro ambulation in parallel bars X 4 laps  Neuro re-ed Cues for appropriate step length with retro ambulation.   Walking on 2x4 (tandem) w/ UE X4 laps  NMR Patient able to perform without significant sway or difficulty                          Treatment provided this session  Patient requires occasional rest breaks in order to prevent exacerbation of low back pain these respects proceeded with moist heat pack applied to the low back.  Pt educated throughout session about proper posture and technique with exercises. Improved exercise technique, movement at target joints, use of target muscles after min to mod verbal, visual, tactile cues. Note: Portions of this document were prepared using Dragon voice recognition software and although reviewed may contain unintentional dictation errors in syntax, grammar, or spelling.      [facility] PT Assessment - 10/30/21 0001       Mini-BESTest   Sit To Stand Normal: Comes to stand without use of hands and stabilizes independently.    Rise to Toes Moderate: Heels up, but not full range (smaller than when holding hands), OR noticeable instability  for 3 s.    Stand on one  leg (left) Moderate: < 20 s    Stand on one leg (right) Severe: Unable    Stand on one leg - lowest score 0    Compensatory Stepping Correction - Forward Moderate: More than one step is required to recover equilibrium    Compensatory Stepping Correction - Backward Moderate: More than one step is required to recover equilibrium    Compensatory Stepping Correction - Left Lateral Moderate: Several steps to recover equilibrium    Compensatory Stepping Correction - Right Lateral Moderate: Several steps to recover equilibrium    Stepping Corredtion Lateral - lowest score 1    Stance - Feet together, eyes open, firm surface  Normal: 30s    Stance - Feet together, eyes closed, foam surface  Normal: 30s    Incline - Eyes Closed Normal: Stands independently 30s and aligns with gravity    Change in Gait Speed Moderate: Unable to change walking speed or signs of imbalance    Walk with head turns - Horizontal Moderate: performs head turns with reduction in gait speed.    Walk with pivot turns Normal: Turns with feet close FAST (< 3 steps) with good balance.    Step over obstacles Normal: Able to step over box with minimal change of gait speed and with good balance.    Timed UP & GO with Dual Task Moderate: Dual Task affects either counting OR walking (>10%) when compared to the TUG without Dual Task.    Mini-BEST total score 19              PATIENT EDUCATION: Education details: need to monitor BP closely  Person educated: Patient Education method: Explanation, Demonstration, Tactile cues, and Verbal cues Education comprehension: verbalized understanding, returned demonstration, verbal cues required, tactile cues required, and needs further education   HOME EXERCISE PROGRAM: PWR! Basic 4 seated x 10 ea, handout provided: addended to not perform seated PRW! Rock exercise and added the below to program on 09/25/21  Access Code: [email or web address] URL: [email or web address] Date:  09/25/2021 Prepared by: Rivka Barbara  Exercises - Standing Romberg to 3/4 Tandem Stance  - 1 x daily - 7 x weekly - 2 sets - 30 seocnd hold - Standing March with Counter Support  - 1 x daily - 7 x weekly - 2 sets - 10 reps - Modified Single Leg Balance on Step  - 1 x daily - 7 x weekly - 2 sets - 30 second hold  Access Code: [email or web address] URL: [email or web address] Date: 10/21/2021 Prepared by: Janna Arch  Exercises - Seated Abdominal Press into [employer]  - 1 x daily - 7 x weekly - 2 sets - 10 reps - 5 hold - Seated Flexion Stretch with Swiss Ball  - 1 x daily - 7 x weekly - 2 sets - 10 reps - 5 hold  GOALS: Goals reviewed with patient? Yes  SHORT TERM GOALS: Target date: 10/02/2021  Pt will be independent with initial HEP in order to improve strength and balance in order to decrease fall risk and improve function at home and work.  Baseline: 08/21/2021-No formal HEP in place Goal status: MET    LONG TERM GOALS: Target date: 11/13/2021  Pt will improve BERG by at least 3 points in order to demonstrate clinically significant improvement in balance.    Baseline: 08/21/2021= 48/56 8/24:47/56 Goal status: INITIAL  2.  Pt will be independent with final HEP in order to improve strength and balance  in order to decrease fall risk and improve function at home and work.  Baseline: No HEP in place, 8/24: completing current HEP but still progressing  Goal status: IN PROGRESS  3.  Pt will decrease 5TSTS by at least 4 seconds in order to demonstrate clinically significant improvement in LE strength. Baseline: 08/21/2021= 19.01 sec without UE support 8/24:10.56 sec Goal status: MET  4.  Pt will decrease TUG to below 14 seconds/decrease in order to demonstrate decreased fall risk. Baseline:  08/21/2021=15.67 with RW 8/24: 12.41 Goal status: MET  5.  Pt will improve FOTO to target score of 56 to display perceived improvements in ability to complete ADL's.  Baseline:  08/21/2021=49 8/24: 45.6 Goal status: IN PROGRESS  6.  Pt will increase 10MWT by at least 0.15 m/s in order to demonstrate clinically significant improvement in community ambulation.   Baseline: 08/22/2021=0.94 m/s 8/24:1.22ms Goal status: MET  .7 Pt will improve miniBESTest by at least 4 points to demonstrate clinically significant improvement in reduced falls with parkinsonian related deficits.  Baseline: 19/28 Goal Status: INITIAL   ASSESSMENT:  CLINICAL IMPRESSION:  Continued with current plan of care as laid out in evaluation and recent prior sessions. Pt remains motivated to advance progress toward goals in order to maximize independence and safety at home. Pt requires high level assistance and cuing for completion of exercises in order to provide adequate level of stimulation and perturbation. Author allows pt as much opportunity as possible to perform independent righting strategies, only stepping in when pt is unable to prevent falling to floor. Pt closely monitored throughout session for safe vitals response and to maximize patient safety during interventions. Pt continues to demonstrate progress toward goals AEB progression of some interventions this date either in volume or intensity.  Patient further assessed with mini best balance test in order to retest his compensatory stepping reactions.  Patient did score higher and his goals were adjusted this session as a result.  Pt will continue to benefit from skilled physical therapy intervention to address impairments, improve QOL, and attain therapy goals.      OBJECTIVE IMPAIRMENTS Abnormal gait, decreased activity tolerance, decreased balance, decreased coordination, decreased endurance, decreased knowledge of use of DME, decreased mobility, difficulty walking, decreased ROM, decreased strength, hypomobility, and impaired flexibility.   ACTIVITY LIMITATIONS carrying, lifting, bending, standing, squatting, sleeping, stairs, transfers,  continence, bathing, toileting, and dressing  PARTICIPATION LIMITATIONS: cleaning, laundry, medication management, driving, shopping, community activity, and yard work  PERSONAL FACTORS Age are also affecting patient's functional outcome.   REHAB POTENTIAL: Good  CLINICAL DECISION MAKING: Stable/uncomplicated  EVALUATION COMPLEXITY: Low  PLAN: PT FREQUENCY: 2x/week  PT DURATION: 12 weeks  PLANNED INTERVENTIONS: Therapeutic exercises, Therapeutic activity, Neuromuscular re-education, Balance training, Gait training, Patient/Family education, Joint mobilization, Stair training, Vestibular training, Canalith repositioning, DME instructions, Dry Needling, Electrical stimulation, Spinal manipulation, and Spinal mobilization  PLAN FOR NEXT SESSION: continue balance training, monitor hypotension and orthostatics, ROM for flexibility,   CParticia LatherPT  10/30/21, 5:02 PM

## 2021-10-30 NOTE — Therapy (Signed)
OUTPATIENT OCCUPATIONAL THERAPY NEURO TREATMENT NOTE  Patient Name: Joseph Arroyo MRN: [id number] DOB:07/29/1940, 81 y.o., male Today's Date: 10/30/2021  PCP: Dr. Nobie Putnam REFERRING PROVIDER: Dr. Lavena Bullion   OT End of Session - 10/30/21 1707     Visit Number 13    Number of Visits 24    Date for OT Re-Evaluation 11/11/21    Authorization Time Period Reporting period beginning 08/19/21    OT Start Time 1600    OT Stop Time 1645    OT Time Calculation (min) 45 min    Activity Tolerance Patient tolerated treatment well    Behavior During Therapy [facility] for tasks assessed/performed              Past Medical History:  Diagnosis Date   Allergic rhinitis due to pollen 11/21/2007   Brachial neuritis or radiculitis NOS    Cervicalgia    Concussion    age 38 - s/p accident   Costal chondritis    Depression    Essential and other specified forms of tremor    GERD (gastroesophageal reflux disease)    in past   Lumbago    Occlusion and stenosis of carotid artery without mention of cerebral infarction    Seizures [facility])    age 64 - after concussion   Past Surgical History:  Procedure Laterality Date   CATARACT EXTRACTION Right 07/18/14   CATARACT EXTRACTION W/PHACO Left 08/01/2014   Procedure: CATARACT EXTRACTION PHACO AND INTRAOCULAR LENS PLACEMENT ([place]);  Surgeon: Leandrew Koyanagi, MD;  Location: [facility];  Service: Ophthalmology;  Laterality: Left;  IVA TOPICAL   COLONOSCOPY  2008   OTHER SURGICAL HISTORY  2002   ear surgery   STAPEDES SURGERY Right 2002   Patient Active Problem List   Diagnosis Date Noted   Dysphagia, pharyngeal phase 09/17/2021   Cervicalgia 08/05/2021   Spondylosis without myelopathy or radiculopathy, lumbosacral region 06/24/2021   Lumbosacral facet hypertrophy 06/03/2021   Lumbar central spinal stenosis, w/o neurogenic claudication (L4-5) 06/03/2021   Lumbar lateral recess stenosis (Bilateral: L2-3, L4-5) (Right:  L3-4) 06/03/2021   Lumbosacral foraminal stenosis (Bilateral: L2-3, L3-4) (Left: L5-S1) 06/03/2021   Lumbar nerve root impingement (Right: L3 at L2-3 & L3-4) 06/03/2021   Ligamentum flavum hypertrophy (L3-4, L4-5) 06/03/2021   Abnormal MRI, lumbar spine (04/23/2021) 04/24/2021   Long term prescription benzodiazepine use (alprazolam) (Xanax) 03/24/2021   Grade 1 Retrolisthesis of L2/L3 (5 mm) and L3/L4 (3 mm) 03/24/2021   Levoscoliosis of lumbar spine (L3-4 apex) 03/24/2021   DDD (degenerative disc disease), lumbosacral 03/24/2021   Lumbosacral facet arthropathy (Left: L3-4, L4-5, and L5-S1) 03/24/2021   Tricompartment osteoarthritis of knee (Left) 03/24/2021   Baker cyst (Left) 03/24/2021   Chronic low back pain (1ry area of Pain) (Bilateral) (R>L) w/o sciatica 03/24/2021   Lumbar facet syndrome (Bilateral) 03/24/2021   Chronic lower extremity pain (2ry area of Pain) (Bilateral) (L>R) 03/24/2021   Lumbosacral radiculitis/sensory radiculopathy at L2 (Bilateral) 03/24/2021   Lumbosacral radiculitis/sensory radiculopathy at L3 (Bilateral) 03/24/2021   Chronic pain syndrome 03/23/2021   Pharmacologic therapy 03/23/2021   Disorder of skeletal system 03/23/2021   Problems influencing health status 03/23/2021   PAD (peripheral artery disease) ([place]) 10/01/2020   GAD (generalized anxiety disorder) 01/13/2018   MDD (major depressive disorder) 08/23/7626   Umbilical hernia 31/51/7616   Chronic knee pain (Left) 08/11/2017   Derangement of medial meniscus, posterior horn (Left) 08/11/2017   Vitamin D insufficiency 03/05/2016   BPH without obstruction/lower urinary tract  symptoms 07/08/2015   Chronic fatigue 10/23/2014   Parkinson's disease ([place]) 09/20/2014   Major depression, recurrent, full remission ([place]) 07/26/2013   Unsteady gait 07/26/2013   Orthostatic hypotension 07/26/2013   Depression 07/26/2013   Anxiety 06/23/2013   Chronic anxiety 06/23/2013   Tremor 05/18/2013   Bilateral  carotid artery stenosis 08/30/2012    ONSET DATE: 08/11/21 date of referral.  PD symptoms since 2015.  REFERRING DIAG: Parkinson's Disease  THERAPY DIAG:  Muscle weakness (generalized)  Rationale for Evaluation and Treatment Rehabilitation  SUBJECTIVE:   SUBJECTIVE STATEMENT: Pt. has an eye procedure coming up on 11/06/21 where the doctor will be cleaning one of his lenses.    PERTINENT HISTORY: Per chart, pt with hx of Parkinson's disease ([place]); Chronic knee pain (Left); Derangement of medial meniscus, posterior horn (Left); PAD (peripheral artery disease) ([place]); Chronic pain syndrome; DDD (degenerative disc disease) lumbosacral; osteoarthritis of knee (Left); Chronic low back pain, Lumbar facet syndrome (Bilateral); Chronic lower extremity pain. PRECAUTIONS: Fall  WEIGHT BEARING RESTRICTIONS No  PAIN:  Are you having pain? Yes: NPRS scale:   5/10 Pain location: low back Pain description: achy Aggravating factors: N/A, constant Relieving factors: rest, repositioning  PATIENT GOALS : Increase efficiency with daily tasks, increase FMC skills   OBJECTIVE:  HAND FUNCTION: Grip strength: Right: 48 lbs; Left: 50 lbs, Lateral pinch: Right: 12 lbs, Left: 10 lbs, and 3 point pinch: Right: 11 lbs, Left: 9 lbs   COORDINATION: 9 Hole Peg test: Right: 33 sec; Left: 32 sec  TODAY'S TREATMENT:   Neuromusucular re-education:  Pt. worked on bilateral pinch strengthening for lateral, and 3pt. pinch using yellow, red, green, and blue resistive clips on the right, and lateral pinch on the left. Pt. Performed 3pt. Pinch strengthening with the yellow, and red resistive clips. Pt. worked on placing the clips at various vertical and horizontal angles. Tactile and verbal cues were required for eliciting the desired movement. Pt. worked on  right  hand grasping 1" resistive cubes on thumb to the tip of the 2nd, and 3rd digits while the board is placed at a vertical angle. Pt. worked on pressing the  cubes back into place while alternating isolated 2nd through 5th digit extension.    PATIENT EDUCATION: Education details: fall prevention strategies  Person educated: Patient and Spouse Education method: Explanation and Verbal cues Education comprehension: verbalized understanding, returned demo   HOME EXERCISE PROGRAM:  BUE strengthening with yellow/red theraband   GOALS: Goals reviewed with patient? Yes  SHORT TERM GOALS: Target date: 09/30/21  Pt will be indep to perform HEP for increasing strength and coordination in B hands.  Baseline: HEP not yet initiated; 10th: pt performs yellow theraband and theraputty exercises regularly and independently Goal status: ongoing  2.  Pt will be indep to verbalize 3 fall prevention strategies to implement during ADLs.  Baseline: Not yet initiated; 10th: Pt is able to verbalize 3+ strategies for fall prevention (ie consistent use of walker, slow positional changes, removal of throw rugs, keep pathways clear)  Goal status: met   3 LONG TERM GOALS: 11/11/21  Pt will increase FOTO score by 3 or more points to indicate increased perceived functional performance with daily tasks.  Baseline: FOTO 60; 10th: will complete next session (therapist's computer unavailable) Goal status: INITIAL  2.  Pt will be able to efficiently send short text messages to family/friends with modified techniques as needed (ie: use of stylus, voice to text) Baseline: Spouse sends texts for pt; 10th: pt  started practicing with use of stylus and will continue to assess if efficiency is better with or without; pt requires reminders to use predictive texting  Goal status: ongoing  3.  Pt will fill out a check for bill paying with 90% legibility with adapted pen as needed. Baseline: Pt reports inconsistent legibility; 100% legibility with compensatory strategies: Forearm support on table top, postural corrections, wide grip pen Goal status: met  4.  Pt will increase  efficiency with dressing tasks, donning clothes in 15 min or less. Baseline: Spouse reports it can take pt 20-30 min to dress in the morning Goal status: ongoing    ASSESSMENT:  CLINICAL IMPRESSION:   Pt.'s progress from the most recent progress report was reviewed with the Pt., and his wife, as well as the rationale for continued OT services. Pt. presents with 5/10 low back pain today. Pt. required verbal cues, and cues for visual demonstration of the proper technique when formulating lateral, and 3pt. pinch patterns. Pinch strengthening tasks were modified on the left for lower resistance as Pt. presented with left thumb MCP soreness. Pt. Continues to work on improving bilateral hand function skills in preparation for improving engagement during ADLs, and IADL tasks.      PERFORMANCE DEFICITS in functional skills including ADLs, IADLs, coordination, dexterity, ROM, strength, pain, flexibility, FMC, and GMC, cognitive skills including safety awareness.  IMPAIRMENTS are limiting patient from ADLs, IADLs, and leisure.   COMORBIDITIES has co-morbidities such as scoliosis, chronic back pain and leg pain  that affects occupational performance. Patient will benefit from skilled OT to address above impairments and improve overall function.  MODIFICATION OR ASSISTANCE TO COMPLETE EVALUATION: No modification of tasks or assist necessary to complete an evaluation.  OT OCCUPATIONAL PROFILE AND HISTORY: Problem focused assessment: Including review of records relating to presenting problem.  CLINICAL DECISION MAKING: Moderate - several treatment options, min-mod task modification necessary  REHAB POTENTIAL: Good  EVALUATION COMPLEXITY: Moderate    PLAN: OT FREQUENCY: 2x/week  OT DURATION: 12 weeks  PLANNED INTERVENTIONS: self care/ADL training, therapeutic exercise, therapeutic activity, neuromuscular re-education, manual therapy, passive range of motion, balance training, functional mobility  training, moist heat, cryotherapy, patient/family education, cognitive remediation/compensation, energy conservation, coping strategies training, and DME and/or AE instructions  RECOMMENDED OTHER SERVICES: N/A  CONSULTED AND AGREED WITH PLAN OF CARE: Patient and family member/caregiver  PLAN FOR NEXT SESSION: See above   Harrel Carina, MS, OTR/L   Harrel Carina, OT 10/30/2021, 5:18 PM

## 2021-11-04 ENCOUNTER — Ambulatory Visit: Payer: Medicare HMO

## 2021-11-04 ENCOUNTER — Encounter: Payer: Medicare HMO | Admitting: Speech Pathology

## 2021-11-04 ENCOUNTER — Ambulatory Visit: Payer: Medicare HMO | Attending: Neurology | Admitting: Occupational Therapy

## 2021-11-04 DIAGNOSIS — R269 Unspecified abnormalities of gait and mobility: Secondary | ICD-10-CM | POA: Diagnosis not present

## 2021-11-04 DIAGNOSIS — R2681 Unsteadiness on feet: Secondary | ICD-10-CM | POA: Insufficient documentation

## 2021-11-04 DIAGNOSIS — M6281 Muscle weakness (generalized): Secondary | ICD-10-CM | POA: Insufficient documentation

## 2021-11-04 DIAGNOSIS — R278 Other lack of coordination: Secondary | ICD-10-CM | POA: Insufficient documentation

## 2021-11-04 DIAGNOSIS — R2689 Other abnormalities of gait and mobility: Secondary | ICD-10-CM | POA: Insufficient documentation

## 2021-11-04 DIAGNOSIS — R262 Difficulty in walking, not elsewhere classified: Secondary | ICD-10-CM

## 2021-11-04 DIAGNOSIS — G20 Parkinson's disease: Secondary | ICD-10-CM | POA: Diagnosis not present

## 2021-11-04 NOTE — Therapy (Signed)
OUTPATIENT OCCUPATIONAL THERAPY NEURO TREATMENT NOTE  Patient Name: Joseph Arroyo MRN: [id number] DOB:17-Jun-1940, 81 y.o., male Today's Date: 11/04/2021  PCP: Dr. Nobie Putnam REFERRING PROVIDER: Dr. Lavena Bullion   OT End of Session - 11/04/21 1616     Visit Number 14    Number of Visits 24    Date for OT Re-Evaluation 11/11/21    Authorization Time Period Reporting period beginning 08/19/21    OT Start Time 1435    OT Stop Time 1515    OT Time Calculation (min) 40 min    Activity Tolerance Patient tolerated treatment well    Behavior During Therapy [facility] for tasks assessed/performed              Past Medical History:  Diagnosis Date   Allergic rhinitis due to pollen 11/21/2007   Brachial neuritis or radiculitis NOS    Cervicalgia    Concussion    age 71 - s/p accident   Costal chondritis    Depression    Essential and other specified forms of tremor    GERD (gastroesophageal reflux disease)    in past   Lumbago    Occlusion and stenosis of carotid artery without mention of cerebral infarction    Seizures [facility])    age 5 - after concussion   Past Surgical History:  Procedure Laterality Date   CATARACT EXTRACTION Right 07/18/14   CATARACT EXTRACTION W/PHACO Left 08/01/2014   Procedure: CATARACT EXTRACTION PHACO AND INTRAOCULAR LENS PLACEMENT ([place]);  Surgeon: Leandrew Koyanagi, MD;  Location: [facility];  Service: Ophthalmology;  Laterality: Left;  IVA TOPICAL   COLONOSCOPY  2008   OTHER SURGICAL HISTORY  2002   ear surgery   STAPEDES SURGERY Right 2002   Patient Active Problem List   Diagnosis Date Noted   Dysphagia, pharyngeal phase 09/17/2021   Cervicalgia 08/05/2021   Spondylosis without myelopathy or radiculopathy, lumbosacral region 06/24/2021   Lumbosacral facet hypertrophy 06/03/2021   Lumbar central spinal stenosis, w/o neurogenic claudication (L4-5) 06/03/2021   Lumbar lateral recess stenosis (Bilateral: L2-3, L4-5) (Right:  L3-4) 06/03/2021   Lumbosacral foraminal stenosis (Bilateral: L2-3, L3-4) (Left: L5-S1) 06/03/2021   Lumbar nerve root impingement (Right: L3 at L2-3 & L3-4) 06/03/2021   Ligamentum flavum hypertrophy (L3-4, L4-5) 06/03/2021   Abnormal MRI, lumbar spine (04/23/2021) 04/24/2021   Long term prescription benzodiazepine use (alprazolam) (Xanax) 03/24/2021   Grade 1 Retrolisthesis of L2/L3 (5 mm) and L3/L4 (3 mm) 03/24/2021   Levoscoliosis of lumbar spine (L3-4 apex) 03/24/2021   DDD (degenerative disc disease), lumbosacral 03/24/2021   Lumbosacral facet arthropathy (Left: L3-4, L4-5, and L5-S1) 03/24/2021   Tricompartment osteoarthritis of knee (Left) 03/24/2021   Baker cyst (Left) 03/24/2021   Chronic low back pain (1ry area of Pain) (Bilateral) (R>L) w/o sciatica 03/24/2021   Lumbar facet syndrome (Bilateral) 03/24/2021   Chronic lower extremity pain (2ry area of Pain) (Bilateral) (L>R) 03/24/2021   Lumbosacral radiculitis/sensory radiculopathy at L2 (Bilateral) 03/24/2021   Lumbosacral radiculitis/sensory radiculopathy at L3 (Bilateral) 03/24/2021   Chronic pain syndrome 03/23/2021   Pharmacologic therapy 03/23/2021   Disorder of skeletal system 03/23/2021   Problems influencing health status 03/23/2021   PAD (peripheral artery disease) ([place]) 10/01/2020   GAD (generalized anxiety disorder) 01/13/2018   MDD (major depressive disorder) 11/91/4782   Umbilical hernia 95/62/1308   Chronic knee pain (Left) 08/11/2017   Derangement of medial meniscus, posterior horn (Left) 08/11/2017   Vitamin D insufficiency 03/05/2016   BPH without obstruction/lower urinary tract  symptoms 07/08/2015   Chronic fatigue 10/23/2014   Parkinson's disease ([place]) 09/20/2014   Major depression, recurrent, full remission ([place]) 07/26/2013   Unsteady gait 07/26/2013   Orthostatic hypotension 07/26/2013   Depression 07/26/2013   Anxiety 06/23/2013   Chronic anxiety 06/23/2013   Tremor 05/18/2013   Bilateral  carotid artery stenosis 08/30/2012    ONSET DATE: 08/11/21 date of referral.  PD symptoms since 2015.  REFERRING DIAG: Parkinson's Disease  THERAPY DIAG:  Muscle weakness (generalized)  Other lack of coordination  Rationale for Evaluation and Treatment Rehabilitation  SUBJECTIVE:   SUBJECTIVE STATEMENT: Pt. has an eye procedure coming up on 11/06/21 where the doctor will be cleaning one of his lenses.    PERTINENT HISTORY: Per chart, pt with hx of Parkinson's disease ([place]); Chronic knee pain (Left); Derangement of medial meniscus, posterior horn (Left); PAD (peripheral artery disease) ([place]); Chronic pain syndrome; DDD (degenerative disc disease) lumbosacral; osteoarthritis of knee (Left); Chronic low back pain, Lumbar facet syndrome (Bilateral); Chronic lower extremity pain. PRECAUTIONS: Fall  WEIGHT BEARING RESTRICTIONS No  PAIN:  Are you having pain? Yes: NPRS scale:   5/10 Pain location: low back Pain description: achy Aggravating factors: N/A, constant Relieving factors: rest, repositioning , heat  PATIENT GOALS : Increase efficiency with daily tasks, increase FMC skills   OBJECTIVE:  HAND FUNCTION: Grip strength: Right: 48 lbs; Left: 50 lbs, Lateral pinch: Right: 12 lbs, Left: 10 lbs, and 3 point pinch: Right: 11 lbs, Left: 9 lbs   COORDINATION: 9 Hole Peg test: Right: 33 sec; Left: 32 sec  TODAY'S TREATMENT:   Neuromusucular re-education:  Pt. performed 4# dowel ex. for UE strengthening secondary to weakness for Bilateral shoulder flexion, and chest presses. Pt. requires rest breaks and verbal cues for proper technique. Pt. worked on bilateral pinch strengthening for lateral, and 3pt. pinch using yellow, red, green, and blue resistive clips on the right, and 3pt. pinch on the left. Pt. performed lateral pinch strengthening with the yellow, and red, resistive clips. Pt. worked on placing the clips at various vertical and horizontal angles. Pt. required cues for hand  position, and technique to encourage wrist extension. Pt. Worked on [facility] skills using manual long nosed tweezers to grasp, and turn the small 1/8" pegs from a horizontal, to a vertical position in preparation for placing them onto a small pegboard.   PATIENT EDUCATION: Education details: fall prevention strategies  Person educated: Patient and Spouse Education method: Explanation and Verbal cues Education comprehension: verbalized understanding, returned demo   HOME EXERCISE PROGRAM:  BUE strengthening with yellow/red theraband   GOALS: Goals reviewed with patient? Yes  SHORT TERM GOALS: Target date: 09/30/21  Pt will be indep to perform HEP for increasing strength and coordination in B hands.  Baseline: HEP not yet initiated; 10th: pt performs yellow theraband and theraputty exercises regularly and independently Goal status: ongoing  2.  Pt will be indep to verbalize 3 fall prevention strategies to implement during ADLs.  Baseline: Not yet initiated; 10th: Pt is able to verbalize 3+ strategies for fall prevention (ie consistent use of walker, slow positional changes, removal of throw rugs, keep pathways clear)  Goal status: met   3 LONG TERM GOALS: 11/11/21  Pt will increase FOTO score by 3 or more points to indicate increased perceived functional performance with daily tasks.  Baseline: FOTO 60; 10th: will complete next session (therapist's computer unavailable) Goal status: INITIAL  2.  Pt will be able to efficiently send short text messages to  family/friends with modified techniques as needed (ie: use of stylus, voice to text) Baseline: Spouse sends texts for pt; 10th: pt started practicing with use of stylus and will continue to assess if efficiency is better with or without; pt requires reminders to use predictive texting  Goal status: ongoing  3.  Pt will fill out a check for bill paying with 90% legibility with adapted pen as needed. Baseline: Pt reports inconsistent  legibility; 100% legibility with compensatory strategies: Forearm support on table top, postural corrections, wide grip pen Goal status: met  4.  Pt will increase efficiency with dressing tasks, donning clothes in 15 min or less. Baseline: Spouse reports it can take pt 20-30 min to dress in the morning Goal status: ongoing    ASSESSMENT:  CLINICAL IMPRESSION:   Pt. Reports that he has  been consistently doing his hand exercises, and  PWR exercises at home over the weekend. Pt. required verbal cues, and cues for visual demonstration of the proper technique when formulating lateral, and 3pt. pinch patterns with wrist extension. Pt. required his reading glasses, and proximal support of the right elbow, and distal forearm during the tweezer manipulation task today. Pt. continues to work on improving bilateral hand function skills in preparation for improving UE engagement during ADLs, and IADL tasks.      PERFORMANCE DEFICITS in functional skills including ADLs, IADLs, coordination, dexterity, ROM, strength, pain, flexibility, FMC, and GMC, cognitive skills including safety awareness.  IMPAIRMENTS are limiting patient from ADLs, IADLs, and leisure.   COMORBIDITIES has co-morbidities such as scoliosis, chronic back pain and leg pain  that affects occupational performance. Patient will benefit from skilled OT to address above impairments and improve overall function.  MODIFICATION OR ASSISTANCE TO COMPLETE EVALUATION: No modification of tasks or assist necessary to complete an evaluation.  OT OCCUPATIONAL PROFILE AND HISTORY: Problem focused assessment: Including review of records relating to presenting problem.  CLINICAL DECISION MAKING: Moderate - several treatment options, min-mod task modification necessary  REHAB POTENTIAL: Good  EVALUATION COMPLEXITY: Moderate    PLAN: OT FREQUENCY: 2x/week  OT DURATION: 12 weeks  PLANNED INTERVENTIONS: self care/ADL training, therapeutic  exercise, therapeutic activity, neuromuscular re-education, manual therapy, passive range of motion, balance training, functional mobility training, moist heat, cryotherapy, patient/family education, cognitive remediation/compensation, energy conservation, coping strategies training, and DME and/or AE instructions  RECOMMENDED OTHER SERVICES: N/A  CONSULTED AND AGREED WITH PLAN OF CARE: Patient and family member/caregiver  PLAN FOR NEXT SESSION: See above   Harrel Carina, MS, OTR/L   Harrel Carina, OT 11/04/2021, 4:28 PM

## 2021-11-04 NOTE — Therapy (Signed)
OUTPATIENT PHYSICAL THERAPY NEURO    Patient Name: Joseph Arroyo MRN: [id number] DOB:1940-10-09, 81 y.o., male Today's Date: 11/04/2021   PCP: Olin Hauser, DO REFERRING PROVIDER: Lavena Bullion, NP   PT End of Session - 11/04/21 1506     Visit Number 13    Number of Visits 25    Date for PT Re-Evaluation 11/13/21    Authorization Type [employer]    Authorization Time Period Initial Cert 5/63/1497- 0/26/3785    Progress Note Due on Visit 10    PT Start Time 1518    PT Stop Time 1601    PT Time Calculation (min) 43 min    Equipment Utilized During Treatment Gait belt    Activity Tolerance Patient tolerated treatment well    Behavior During Therapy [facility] for tasks assessed/performed                   Past Medical History:  Diagnosis Date   Allergic rhinitis due to pollen 11/21/2007   Brachial neuritis or radiculitis NOS    Cervicalgia    Concussion    age 44 - s/p accident   Costal chondritis    Depression    Essential and other specified forms of tremor    GERD (gastroesophageal reflux disease)    in past   Lumbago    Occlusion and stenosis of carotid artery without mention of cerebral infarction    Seizures [facility])    age 60 - after concussion   Past Surgical History:  Procedure Laterality Date   CATARACT EXTRACTION Right 07/18/14   CATARACT EXTRACTION W/PHACO Left 08/01/2014   Procedure: CATARACT EXTRACTION PHACO AND INTRAOCULAR LENS PLACEMENT ([place]);  Surgeon: Leandrew Koyanagi, MD;  Location: [facility];  Service: Ophthalmology;  Laterality: Left;  IVA TOPICAL   COLONOSCOPY  2008   OTHER SURGICAL HISTORY  2002   ear surgery   STAPEDES SURGERY Right 2002   Patient Active Problem List   Diagnosis Date Noted   Dysphagia, pharyngeal phase 09/17/2021   Cervicalgia 08/05/2021   Spondylosis without myelopathy or radiculopathy, lumbosacral region 06/24/2021   Lumbosacral facet hypertrophy 06/03/2021   Lumbar central spinal  stenosis, w/o neurogenic claudication (L4-5) 06/03/2021   Lumbar lateral recess stenosis (Bilateral: L2-3, L4-5) (Right: L3-4) 06/03/2021   Lumbosacral foraminal stenosis (Bilateral: L2-3, L3-4) (Left: L5-S1) 06/03/2021   Lumbar nerve root impingement (Right: L3 at L2-3 & L3-4) 06/03/2021   Ligamentum flavum hypertrophy (L3-4, L4-5) 06/03/2021   Abnormal MRI, lumbar spine (04/23/2021) 04/24/2021   Long term prescription benzodiazepine use (alprazolam) (Xanax) 03/24/2021   Grade 1 Retrolisthesis of L2/L3 (5 mm) and L3/L4 (3 mm) 03/24/2021   Levoscoliosis of lumbar spine (L3-4 apex) 03/24/2021   DDD (degenerative disc disease), lumbosacral 03/24/2021   Lumbosacral facet arthropathy (Left: L3-4, L4-5, and L5-S1) 03/24/2021   Tricompartment osteoarthritis of knee (Left) 03/24/2021   Baker cyst (Left) 03/24/2021   Chronic low back pain (1ry area of Pain) (Bilateral) (R>L) w/o sciatica 03/24/2021   Lumbar facet syndrome (Bilateral) 03/24/2021   Chronic lower extremity pain (2ry area of Pain) (Bilateral) (L>R) 03/24/2021   Lumbosacral radiculitis/sensory radiculopathy at L2 (Bilateral) 03/24/2021   Lumbosacral radiculitis/sensory radiculopathy at L3 (Bilateral) 03/24/2021   Chronic pain syndrome 03/23/2021   Pharmacologic therapy 03/23/2021   Disorder of skeletal system 03/23/2021   Problems influencing health status 03/23/2021   PAD (peripheral artery disease) ([place]) 10/01/2020   GAD (generalized anxiety disorder) 01/13/2018   MDD (major depressive disorder) 88/50/2774   Umbilical  hernia 09/04/2017   Chronic knee pain (Left) 08/11/2017   Derangement of medial meniscus, posterior horn (Left) 08/11/2017   Vitamin D insufficiency 03/05/2016   BPH without obstruction/lower urinary tract symptoms 07/08/2015   Chronic fatigue 10/23/2014   Parkinson's disease ([place]) 09/20/2014   Major depression, recurrent, full remission ([place]) 07/26/2013   Unsteady gait 07/26/2013   Orthostatic hypotension  07/26/2013   Depression 07/26/2013   Anxiety 06/23/2013   Chronic anxiety 06/23/2013   Tremor 05/18/2013   Bilateral carotid artery stenosis 08/30/2012    ONSET DATE: 08/11/2021  REFERRING DIAG: Parkinsons Disease  THERAPY DIAG:  Unsteadiness on feet  Difficulty in walking, not elsewhere classified  Rationale for Evaluation and Treatment Rehabilitation  SUBJECTIVE:                                                                                                                                                                                              SUBJECTIVE STATEMENT: Pt comes from OT with heat on low back.  Patient reports his back pain is about 4/10.  Pt reports no falls/stumbles since last appointment. Pt reports no other current concerns. Does have eye appointment Thursday morning, reports he should be able to come to PT that day but will see depending on how he feels after procedure. Reports recent BP readings have been normal.  Pt accompanied by: significant other  PERTINENT HISTORY: Per chart history provided by Dr. Consuela Mimes- [name] has Parkinson's disease ([place]); Chronic knee pain (Left); Derangement of medial meniscus, posterior horn (Left); PAD (peripheral artery disease) ([place]); Chronic pain syndrome; Grade 1 Retrolisthesis of L2/L3 (5 mm) and L3/L4 (3 mm); Levoscoliosis of lumbar spine (L3-4 apex); DDD (degenerative disc disease), lumbosacral; Lumbosacral facet arthropathy (Left: L3-4, L4-5, and L5-S1); Tricompartment osteoarthritis of knee (Left); Baker cyst (Left); Chronic low back pain (1ry area of Pain) (Bilateral) (R>L) w/o sciatica; Lumbar facet syndrome (Bilateral); Chronic lower extremity pain (2ry area of Pain) (Bilateral) (L>R); Lumbosacral radiculitis/sensory radiculopathy at L2 (Bilateral); Lumbosacral radiculitis/sensory radiculopathy at L3 (Bilateral); Abnormal MRI, lumbar spine (04/23/2021); Lumbosacral facet hypertrophy; Lumbar central spinal stenosis,  w/o neurogenic claudication (L4-5); Lumbar lateral recess stenosis (Bilateral: L2-3, L4-5) (Right: L3-4); Lumbosacral foraminal stenosis (Bilateral: L2-3, L3-4) (Left: L5-S1); Lumbar nerve root impingement (Right: L3 at L2-3 & L3-4); Ligamentum flavum hypertrophy (L3-4, L4-5); Spondylosis without myelopathy or radiculopathy, lumbosacral region; and Cervicalgia on their pertinent problem list   Patient has appointment for Epidural to low back next Tues 08/26/2021  PAIN:  Are you having pain? Yes: NPRS scale: 4/10 Pain location: low back Pain description: ache/soreness Aggravating factors: prolonged standing, transfers, walking Relieving factors: rest  and medications  PRECAUTIONS: Fall  WEIGHT BEARING RESTRICTIONS No  FALLS: Has patient fallen in last 6 months? Yes. Number of falls 2  LIVING ENVIRONMENT: Lives with: lives with their spouse Lives in: House/apartment Stairs: 4 Has following equipment at home: Walker - 2 wheeled  PLOF: Needs assistance with ADLs- intermittent  PATIENT GOALS  to walk better and be as active as possible. Also to get in/out of cars better.   OBJECTIVE: (objective measures completed at initial evaluation unless otherwise dated)   DIAGNOSTIC FINDINGS: IMPRESSION: 1. Severe lumbar levoscoliosis with widespread advanced disc and facet degeneration. 2. Mild spinal stenosis at L4-5. 3. Moderate neural foraminal stenosis on the right at L3-4 and on the left at L5-S1. 4. Moderate right lateral recess stenosis at L2-3.      LOWER EXTREMITY MMT:    MMT (evaluation)  Right Eval Left Eval  Hip flexion 4 4  Hip extension 4 4  Hip abduction 4 4  Hip adduction 4 4  Hip internal rotation 4 4  Hip external rotation 4 4  Knee flexion 4 4  Knee extension 4 4  Ankle dorsiflexion 4 4  Ankle plantarflexion 4 4  Ankle inversion 4 4  Ankle eversion 4 4  (Blank rows = not tested)     FUNCTIONAL TESTS (EVALUATION):  5 times sit to stand: 14 sec without  UE support Timed up and go (TUG): 15.67 sec with RW 10 meter walk test: 10.67 sec = 0.94 m/s using RW Berg Balance Scale: 48/56- see flowsheet for details  PATIENT SURVEYS:  FOTO : 49 8/24: 47  TODAY'S TREATMENT 11/04/21     Exercise/Activity Sets/ Reps/Time/ Resistance Assistance Charge type Comments  1 LE airex, 1 LE on step  2x45 sec each LE   Neuro re-ed Significant increase in ankle righting, however, able to perform without UE support  Standing on airex pad NBOS 30 sec   NMR Break taken following intervention due to low back pain  NBOS on airex with head turns: vertical and horizontal 2x10 for each   Neuro re-ed   NBOS on airex with EC 45 sec  NMR Rates medium, requires up to min assist to correct decreased postural stability  Hedgehog taps X multiple reps each LE  NMR Pt reports easier time tapping with the RLE. Decreased eccentric control throughout. Must perform with at least UUE support.  Seated large amplitude twist and clap 6x each side  NMR Initially reported no back pain, but discontinued after last few reps due to increased pain  Large amplitude forward reach, reach toward floor, reach above head and reach out to sides  10x   NMR Cuing throughout for large amplitude movement, and to move into gentle stretch. No increased pain with intervention  Large step over orange hurdle FWD/BCKWD  X multiple reps  NMR Rates difficult, but able to perform hands-free without LOB   Treatment provided this session  Patient continues to require occasional rest breaks in order to prevent exacerbation of low back pain During rest breaks pt utilized heat to low back. Reported heat improves back symptoms. No adverse reaction to treatment.     PATIENT EDUCATION: Education details: Pt educated throughout session about proper posture and technique with exercises. Improved exercise technique, movement at target joints, use of target muscles after min to mod verbal, visual, tactile cues.  Person  educated: Patient Education method: Explanation, Demonstration, Tactile cues, and Verbal cues Education comprehension: verbalized understanding, returned demonstration, verbal cues required, tactile cues  required, and needs further education   HOME EXERCISE PROGRAM: No updates on this date, pt to continue HEP as previously given: PWR! Basic 4 seated x 10 ea, handout provided: addended to not perform seated PRW! Rock exercise and added the below to program on 09/25/21  Access Code: [email or web address] URL: [email or web address] Date: 09/25/2021 Prepared by: Rivka Barbara  Exercises - Standing Romberg to 3/4 Tandem Stance  - 1 x daily - 7 x weekly - 2 sets - 30 seocnd hold - Standing March with Counter Support  - 1 x daily - 7 x weekly - 2 sets - 10 reps - Modified Single Leg Balance on Step  - 1 x daily - 7 x weekly - 2 sets - 30 second hold  Access Code: [email or web address] URL: [email or web address] Date: 10/21/2021 Prepared by: Janna Arch  Exercises - Seated Abdominal Press into [employer]  - 1 x daily - 7 x weekly - 2 sets - 10 reps - 5 hold - Seated Flexion Stretch with Swiss Ball  - 1 x daily - 7 x weekly - 2 sets - 10 reps - 5 hold  GOALS: Goals reviewed with patient? Yes  SHORT TERM GOALS: Target date: 10/02/2021  Pt will be independent with initial HEP in order to improve strength and balance in order to decrease fall risk and improve function at home and work.  Baseline: 08/21/2021-No formal HEP in place Goal status: MET    LONG TERM GOALS: Target date: 11/13/2021  Pt will improve BERG by at least 3 points in order to demonstrate clinically significant improvement in balance.    Baseline: 08/21/2021= 48/56 8/24:47/56 Goal status: INITIAL  2.  Pt will be independent with final HEP in order to improve strength and balance in order to decrease fall risk and improve function at home and work.  Baseline: No HEP in place, 8/24: completing current HEP but still  progressing  Goal status: IN PROGRESS  3.  Pt will decrease 5TSTS by at least 4 seconds in order to demonstrate clinically significant improvement in LE strength. Baseline: 08/21/2021= 19.01 sec without UE support 8/24:10.56 sec Goal status: MET  4.  Pt will decrease TUG to below 14 seconds/decrease in order to demonstrate decreased fall risk. Baseline:  08/21/2021=15.67 with RW 8/24: 12.41 Goal status: MET  5.  Pt will improve FOTO to target score of 56 to display perceived improvements in ability to complete ADL's.  Baseline: 08/21/2021=49 8/24: 45.6 Goal status: IN PROGRESS  6.  Pt will increase 10MWT by at least 0.15 m/s in order to demonstrate clinically significant improvement in community ambulation.   Baseline: 08/22/2021=0.94 m/s 8/24:1.85ms Goal status: MET  .7 Pt will improve miniBESTest by at least 4 points to demonstrate clinically significant improvement in reduced falls with parkinsonian related deficits.  Baseline: 19/28 Goal Status: INITIAL   ASSESSMENT:  CLINICAL IMPRESSION:  Author continued plan as indicated in evaluation and previous sessions. Pt requires frequent, brief rest breaks throughout for pain modulation due to LBP. He continues to respond well to heat as treatment adjunct. Pt rates majority of balance interventions as medium-hard, although he is abl eto perform several with minimal or no UE support.Pt will continue to benefit from skilled physical therapy intervention to address impairments, improve QOL, and attain therapy goals.      OBJECTIVE IMPAIRMENTS Abnormal gait, decreased activity tolerance, decreased balance, decreased coordination, decreased endurance, decreased knowledge of use of DME, decreased mobility, difficulty walking, decreased ROM, decreased strength, hypomobility, and impaired flexibility.  ACTIVITY LIMITATIONS carrying, lifting, bending, standing, squatting, sleeping, stairs, transfers, continence, bathing, toileting, and  dressing  PARTICIPATION LIMITATIONS: cleaning, laundry, medication management, driving, shopping, community activity, and yard work  PERSONAL FACTORS Age are also affecting patient's functional outcome.   REHAB POTENTIAL: Good  CLINICAL DECISION MAKING: Stable/uncomplicated  EVALUATION COMPLEXITY: Low  PLAN: PT FREQUENCY: 2x/week  PT DURATION: 12 weeks  PLANNED INTERVENTIONS: Therapeutic exercises, Therapeutic activity, Neuromuscular re-education, Balance training, Gait training, Patient/Family education, Joint mobilization, Stair training, Vestibular training, Canalith repositioning, DME instructions, Dry Needling, Electrical stimulation, Spinal manipulation, and Spinal mobilization  PLAN FOR NEXT SESSION: continue balance training, monitor hypotension and orthostatics, ROM for flexibility, continue plan  Zollie Pee PT  11/04/21, 4:16 PM

## 2021-11-06 ENCOUNTER — Ambulatory Visit: Payer: Medicare HMO

## 2021-11-06 ENCOUNTER — Ambulatory Visit: Payer: Medicare HMO | Admitting: Physical Therapy

## 2021-11-06 ENCOUNTER — Encounter: Payer: Self-pay | Admitting: Physical Therapy

## 2021-11-06 ENCOUNTER — Encounter: Payer: Medicare HMO | Admitting: Speech Pathology

## 2021-11-06 DIAGNOSIS — R2681 Unsteadiness on feet: Secondary | ICD-10-CM

## 2021-11-06 DIAGNOSIS — H26491 Other secondary cataract, right eye: Secondary | ICD-10-CM | POA: Diagnosis not present

## 2021-11-06 DIAGNOSIS — R269 Unspecified abnormalities of gait and mobility: Secondary | ICD-10-CM

## 2021-11-06 DIAGNOSIS — G20 Parkinson's disease: Secondary | ICD-10-CM | POA: Diagnosis not present

## 2021-11-06 DIAGNOSIS — R262 Difficulty in walking, not elsewhere classified: Secondary | ICD-10-CM

## 2021-11-06 DIAGNOSIS — M6281 Muscle weakness (generalized): Secondary | ICD-10-CM

## 2021-11-06 DIAGNOSIS — R278 Other lack of coordination: Secondary | ICD-10-CM

## 2021-11-06 DIAGNOSIS — R2689 Other abnormalities of gait and mobility: Secondary | ICD-10-CM | POA: Diagnosis not present

## 2021-11-06 NOTE — Therapy (Addendum)
OUTPATIENT OCCUPATIONAL THERAPY NEURO TREATMENT NOTE  Patient Name: Joseph Arroyo MRN: [id number] DOB:26-Jul-1940, 81 y.o., male Today's Date: 11/06/2021  PCP: Dr. Nobie Putnam REFERRING PROVIDER: Dr. Lavena Bullion    OT End of Session - 11/07/21 1940     Visit Number 15    Number of Visits 24    Date for OT Re-Evaluation 11/11/21    Authorization Time Period Reporting period beginning 08/19/21    OT Start Time 62    OT Stop Time 1345    OT Time Calculation (min) 45 min    Equipment Utilized During Treatment RW    Activity Tolerance Patient tolerated treatment well    Behavior During Therapy [facility] for tasks assessed/performed                Past Medical History:  Diagnosis Date   Allergic rhinitis due to pollen 11/21/2007   Brachial neuritis or radiculitis NOS    Cervicalgia    Concussion    age 55 - s/p accident   Costal chondritis    Depression    Essential and other specified forms of tremor    GERD (gastroesophageal reflux disease)    in past   Lumbago    Occlusion and stenosis of carotid artery without mention of cerebral infarction    Seizures [facility])    age 3 - after concussion   Past Surgical History:  Procedure Laterality Date   CATARACT EXTRACTION Right 07/18/14   CATARACT EXTRACTION W/PHACO Left 08/01/2014   Procedure: CATARACT EXTRACTION PHACO AND INTRAOCULAR LENS PLACEMENT ([place]);  Surgeon: Leandrew Koyanagi, MD;  Location: [place];  Service: Ophthalmology;  Laterality: Left;  IVA TOPICAL   COLONOSCOPY  2008   OTHER SURGICAL HISTORY  2002   ear surgery   STAPEDES SURGERY Right 2002   Patient Active Problem List   Diagnosis Date Noted   Dysphagia, pharyngeal phase 09/17/2021   Cervicalgia 08/05/2021   Spondylosis without myelopathy or radiculopathy, lumbosacral region 06/24/2021   Lumbosacral facet hypertrophy 06/03/2021   Lumbar central spinal stenosis, w/o neurogenic claudication (L4-5) 06/03/2021   Lumbar lateral  recess stenosis (Bilateral: L2-3, L4-5) (Right: L3-4) 06/03/2021   Lumbosacral foraminal stenosis (Bilateral: L2-3, L3-4) (Left: L5-S1) 06/03/2021   Lumbar nerve root impingement (Right: L3 at L2-3 & L3-4) 06/03/2021   Ligamentum flavum hypertrophy (L3-4, L4-5) 06/03/2021   Abnormal MRI, lumbar spine (04/23/2021) 04/24/2021   Long term prescription benzodiazepine use (alprazolam) (Xanax) 03/24/2021   Grade 1 Retrolisthesis of L2/L3 (5 mm) and L3/L4 (3 mm) 03/24/2021   Levoscoliosis of lumbar spine (L3-4 apex) 03/24/2021   DDD (degenerative disc disease), lumbosacral 03/24/2021   Lumbosacral facet arthropathy (Left: L3-4, L4-5, and L5-S1) 03/24/2021   Tricompartment osteoarthritis of knee (Left) 03/24/2021   Baker cyst (Left) 03/24/2021   Chronic low back pain (1ry area of Pain) (Bilateral) (R>L) w/o sciatica 03/24/2021   Lumbar facet syndrome (Bilateral) 03/24/2021   Chronic lower extremity pain (2ry area of Pain) (Bilateral) (L>R) 03/24/2021   Lumbosacral radiculitis/sensory radiculopathy at L2 (Bilateral) 03/24/2021   Lumbosacral radiculitis/sensory radiculopathy at L3 (Bilateral) 03/24/2021   Chronic pain syndrome 03/23/2021   Pharmacologic therapy 03/23/2021   Disorder of skeletal system 03/23/2021   Problems influencing health status 03/23/2021   PAD (peripheral artery disease) ([place]) 10/01/2020   GAD (generalized anxiety disorder) 01/13/2018   MDD (major depressive disorder) 65/46/5035   Umbilical hernia 46/56/8127   Chronic knee pain (Left) 08/11/2017   Derangement of medial meniscus, posterior horn (Left) 08/11/2017  Vitamin D insufficiency 03/05/2016   BPH without obstruction/lower urinary tract symptoms 07/08/2015   Chronic fatigue 10/23/2014   Parkinson's disease ([place]) 09/20/2014   Major depression, recurrent, full remission ([place]) 07/26/2013   Unsteady gait 07/26/2013   Orthostatic hypotension 07/26/2013   Depression 07/26/2013   Anxiety 06/23/2013   Chronic anxiety  06/23/2013   Tremor 05/18/2013   Bilateral carotid artery stenosis 08/30/2012    ONSET DATE: 08/11/21 date of referral.  PD symptoms since 2015.  REFERRING DIAG: Parkinson's Disease  THERAPY DIAG:  No diagnosis found.  Rationale for Evaluation and Treatment Rehabilitation  SUBJECTIVE:   SUBJECTIVE STATEMENT: Pt reports doing well after his eye appointment today (lens cleaning).  No restrictions for therapy.   PERTINENT HISTORY: Per chart, pt with hx of Parkinson's disease ([place]); Chronic knee pain (Left); Derangement of medial meniscus, posterior horn (Left); PAD (peripheral artery disease) ([place]); Chronic pain syndrome; DDD (degenerative disc disease) lumbosacral; osteoarthritis of knee (Left); Chronic low back pain, Lumbar facet syndrome (Bilateral); Chronic lower extremity pain. PRECAUTIONS: Fall  WEIGHT BEARING RESTRICTIONS No  PAIN:  Are you having pain? Yes: NPRS scale:   4-5/10 Pain location: low back Pain description: achy Aggravating factors: N/A, constant Relieving factors: rest, repositioning , heat  PATIENT GOALS : Increase efficiency with daily tasks, increase FMC skills   OBJECTIVE:  HAND FUNCTION: Grip strength: Right: 48 lbs; Left: 50 lbs, Lateral pinch: Right: 12 lbs, Left: 10 lbs, and 3 point pinch: Right: 11 lbs, Left: 9 lbs   COORDINATION: 9 Hole Peg test: Right: 33 sec; Left: 32 sec  TODAY'S TREATMENT:   Self Care: Reviewed pt's dressing routine and provided education on optimal positioning and possible effective equipment d/t pt reporting poor balance when reaching down and some discomfort in back when reaching to feet.  Pt reports that he dresses while sitting on the EOB but struggles to keep his sitting balance.  Worked today with pt performing LB dressing tasks in chair with back and arm rests and made recommendation for pt to have a similar chair like this in the bedroom.  Pt also tried a foot stool today for an easier reach to feet.  Instructed pt in  use of long shoe horn, reacher, sockaid.  All effective except sockaid was a struggle for pt to set up and pt verbalized he would be unlikely to use this particular tool as it was no quicker than without a tool.    PATIENT EDUCATION: Education details: fall prevention strategies  Person educated: Patient and Spouse Education method: Explanation and Verbal cues Education comprehension: verbalized understanding, returned demo   HOME EXERCISE PROGRAM:  BUE strengthening with yellow/red theraband   GOALS: Goals reviewed with patient? Yes  SHORT TERM GOALS: Target date: 09/30/21  Pt will be indep to perform HEP for increasing strength and coordination in B hands.  Baseline: HEP not yet initiated; 10th: pt performs yellow theraband and theraputty exercises regularly and independently Goal status: ongoing  2.  Pt will be indep to verbalize 3 fall prevention strategies to implement during ADLs.  Baseline: Not yet initiated; 10th: Pt is able to verbalize 3+ strategies for fall prevention (ie consistent use of walker, slow positional changes, removal of throw rugs, keep pathways clear)  Goal status: met   3 LONG TERM GOALS: 11/11/21  Pt will increase FOTO score by 3 or more points to indicate increased perceived functional performance with daily tasks.  Baseline: FOTO 60; 10th: will complete next session (therapist's computer unavailable) Goal status:  INITIAL  2.  Pt will be able to efficiently send short text messages to family/friends with modified techniques as needed (ie: use of stylus, voice to text) Baseline: Spouse sends texts for pt; 10th: pt started practicing with use of stylus and will continue to assess if efficiency is better with or without; pt requires reminders to use predictive texting  Goal status: ongoing  3.  Pt will fill out a check for bill paying with 90% legibility with adapted pen as needed. Baseline: Pt reports inconsistent legibility; 100% legibility with  compensatory strategies: Forearm support on table top, postural corrections, wide grip pen Goal status: met  4.  Pt will increase efficiency with dressing tasks, donning clothes in 15 min or less. Baseline: Spouse reports it can take pt 20-30 min to dress in the morning Goal status: ongoing    ASSESSMENT:  CLINICAL IMPRESSION:  Focus today on environmental adaptation and AE for increasing efficiency with LB dressing.  Pt found chair with arm rests and back effective, along with 6" foot stool, long shoe horn, and reacher.  OT offered options to obtain AE and pt/spouse plan to do so.  Pt demonstrated and expressed a feeling of more stability in this supportive chair and less strain on back with use of foot stool and long handled aids.  Spouse plans to move a chair with arm rests and back into their bedroom, and spouse reports they have something they can use for a footstool.  Discussed end of cert next week and possible discharge next visit unless pt feels the need to focus some additional time on dressing training, and in that case, may recert for a couple of weeks.  Pt/spouse in agreement with this plan and pt will practice these dressing strategies over the weekend as able.    PERFORMANCE DEFICITS in functional skills including ADLs, IADLs, coordination, dexterity, ROM, strength, pain, flexibility, FMC, and GMC, cognitive skills including safety awareness.  IMPAIRMENTS are limiting patient from ADLs, IADLs, and leisure.   COMORBIDITIES has co-morbidities such as scoliosis, chronic back pain and leg pain  that affects occupational performance. Patient will benefit from skilled OT to address above impairments and improve overall function.  MODIFICATION OR ASSISTANCE TO COMPLETE EVALUATION: No modification of tasks or assist necessary to complete an evaluation.  OT OCCUPATIONAL PROFILE AND HISTORY: Problem focused assessment: Including review of records relating to presenting problem.  CLINICAL  DECISION MAKING: Moderate - several treatment options, min-mod task modification necessary  REHAB POTENTIAL: Good  EVALUATION COMPLEXITY: Moderate    PLAN: OT FREQUENCY: 2x/week  OT DURATION: 12 weeks  PLANNED INTERVENTIONS: self care/ADL training, therapeutic exercise, therapeutic activity, neuromuscular re-education, manual therapy, passive range of motion, balance training, functional mobility training, moist heat, cryotherapy, patient/family education, cognitive remediation/compensation, energy conservation, coping strategies training, and DME and/or AE instructions  RECOMMENDED OTHER SERVICES: N/A  CONSULTED AND AGREED WITH PLAN OF CARE: Patient and family member/caregiver  PLAN FOR NEXT SESSION: See above  Leta Speller, MS, OTR/L   Darleene Cleaver, OT 11/06/2021, 1:04 PM

## 2021-11-06 NOTE — Therapy (Signed)
OUTPATIENT PHYSICAL THERAPY NEURO    Patient Name: Joseph Arroyo MRN: [id number] DOB:1940-11-20, 81 y.o., male Today's Date: 11/06/2021   PCP: Olin Hauser, DO REFERRING PROVIDER: Lavena Bullion, NP   PT End of Session - 11/06/21 1350     Visit Number 14    Number of Visits 25    Date for PT Re-Evaluation 11/13/21    Authorization Type [employer]    Authorization Time Period Initial Cert 04/13/9415- 06/08/1446    Progress Note Due on Visit 20    PT Start Time 1350    PT Stop Time 1429    PT Time Calculation (min) 39 min    Equipment Utilized During Treatment Gait belt    Activity Tolerance Patient tolerated treatment well    Behavior During Therapy WFL for tasks assessed/performed                    Past Medical History:  Diagnosis Date   Allergic rhinitis due to pollen 11/21/2007   Brachial neuritis or radiculitis NOS    Cervicalgia    Concussion    age 28 - s/p accident   Costal chondritis    Depression    Essential and other specified forms of tremor    GERD (gastroesophageal reflux disease)    in past   Lumbago    Occlusion and stenosis of carotid artery without mention of cerebral infarction    Seizures [facility])    age 42 - after concussion   Past Surgical History:  Procedure Laterality Date   CATARACT EXTRACTION Right 07/18/14   CATARACT EXTRACTION W/PHACO Left 08/01/2014   Procedure: CATARACT EXTRACTION PHACO AND INTRAOCULAR LENS PLACEMENT ([place]);  Surgeon: Leandrew Koyanagi, MD;  Location: [facility];  Service: Ophthalmology;  Laterality: Left;  IVA TOPICAL   COLONOSCOPY  2008   OTHER SURGICAL HISTORY  2002   ear surgery   STAPEDES SURGERY Right 2002   Patient Active Problem List   Diagnosis Date Noted   Dysphagia, pharyngeal phase 09/17/2021   Cervicalgia 08/05/2021   Spondylosis without myelopathy or radiculopathy, lumbosacral region 06/24/2021   Lumbosacral facet hypertrophy 06/03/2021   Lumbar central spinal  stenosis, w/o neurogenic claudication (L4-5) 06/03/2021   Lumbar lateral recess stenosis (Bilateral: L2-3, L4-5) (Right: L3-4) 06/03/2021   Lumbosacral foraminal stenosis (Bilateral: L2-3, L3-4) (Left: L5-S1) 06/03/2021   Lumbar nerve root impingement (Right: L3 at L2-3 & L3-4) 06/03/2021   Ligamentum flavum hypertrophy (L3-4, L4-5) 06/03/2021   Abnormal MRI, lumbar spine (04/23/2021) 04/24/2021   Long term prescription benzodiazepine use (alprazolam) (Xanax) 03/24/2021   Grade 1 Retrolisthesis of L2/L3 (5 mm) and L3/L4 (3 mm) 03/24/2021   Levoscoliosis of lumbar spine (L3-4 apex) 03/24/2021   DDD (degenerative disc disease), lumbosacral 03/24/2021   Lumbosacral facet arthropathy (Left: L3-4, L4-5, and L5-S1) 03/24/2021   Tricompartment osteoarthritis of knee (Left) 03/24/2021   Baker cyst (Left) 03/24/2021   Chronic low back pain (1ry area of Pain) (Bilateral) (R>L) w/o sciatica 03/24/2021   Lumbar facet syndrome (Bilateral) 03/24/2021   Chronic lower extremity pain (2ry area of Pain) (Bilateral) (L>R) 03/24/2021   Lumbosacral radiculitis/sensory radiculopathy at L2 (Bilateral) 03/24/2021   Lumbosacral radiculitis/sensory radiculopathy at L3 (Bilateral) 03/24/2021   Chronic pain syndrome 03/23/2021   Pharmacologic therapy 03/23/2021   Disorder of skeletal system 03/23/2021   Problems influencing health status 03/23/2021   PAD (peripheral artery disease) ([place]) 10/01/2020   GAD (generalized anxiety disorder) 01/13/2018   MDD (major depressive disorder) 01/13/2018  Umbilical hernia 16/38/4536   Chronic knee pain (Left) 08/11/2017   Derangement of medial meniscus, posterior horn (Left) 08/11/2017   Vitamin D insufficiency 03/05/2016   BPH without obstruction/lower urinary tract symptoms 07/08/2015   Chronic fatigue 10/23/2014   Parkinson's disease ([place]) 09/20/2014   Major depression, recurrent, full remission ([place]) 07/26/2013   Unsteady gait 07/26/2013   Orthostatic hypotension  07/26/2013   Depression 07/26/2013   Anxiety 06/23/2013   Chronic anxiety 06/23/2013   Tremor 05/18/2013   Bilateral carotid artery stenosis 08/30/2012    ONSET DATE: 08/11/2021  REFERRING DIAG: Parkinsons Disease  THERAPY DIAG:  Unsteadiness on feet  Difficulty in walking, not elsewhere classified  Muscle weakness (generalized)  Abnormality of gait and mobility  Rationale for Evaluation and Treatment Rehabilitation  SUBJECTIVE:                                                                                                                                                                                              SUBJECTIVE STATEMENT: Patient reports no falls or loss of balance since previous session.  Patient does report having procedure on his eye this morning but is feeling okay following this is up for physical therapy today. Pt accompanied by: significant other  PERTINENT HISTORY: Per chart history provided by Dr. Consuela Mimes- Mr. Soule has Parkinson's disease ([place]); Chronic knee pain (Left); Derangement of medial meniscus, posterior horn (Left); PAD (peripheral artery disease) ([place]); Chronic pain syndrome; Grade 1 Retrolisthesis of L2/L3 (5 mm) and L3/L4 (3 mm); Levoscoliosis of lumbar spine (L3-4 apex); DDD (degenerative disc disease), lumbosacral; Lumbosacral facet arthropathy (Left: L3-4, L4-5, and L5-S1); Tricompartment osteoarthritis of knee (Left); Baker cyst (Left); Chronic low back pain (1ry area of Pain) (Bilateral) (R>L) w/o sciatica; Lumbar facet syndrome (Bilateral); Chronic lower extremity pain (2ry area of Pain) (Bilateral) (L>R); Lumbosacral radiculitis/sensory radiculopathy at L2 (Bilateral); Lumbosacral radiculitis/sensory radiculopathy at L3 (Bilateral); Abnormal MRI, lumbar spine (04/23/2021); Lumbosacral facet hypertrophy; Lumbar central spinal stenosis, w/o neurogenic claudication (L4-5); Lumbar lateral recess stenosis (Bilateral: L2-3, L4-5) (Right: L3-4);  Lumbosacral foraminal stenosis (Bilateral: L2-3, L3-4) (Left: L5-S1); Lumbar nerve root impingement (Right: L3 at L2-3 & L3-4); Ligamentum flavum hypertrophy (L3-4, L4-5); Spondylosis without myelopathy or radiculopathy, lumbosacral region; and Cervicalgia on their pertinent problem list   Patient has appointment for Epidural to low back next Tues 08/26/2021  PAIN:  Are you having pain? Yes: NPRS scale: 4/10 Pain location: low back Pain description: ache/soreness Aggravating factors: prolonged standing, transfers, walking Relieving factors: rest and medications  PRECAUTIONS: Fall  WEIGHT BEARING RESTRICTIONS No  FALLS: Has patient fallen in last 6 months? Yes. Number of  falls 2  LIVING ENVIRONMENT: Lives with: lives with their spouse Lives in: House/apartment Stairs: 4 Has following equipment at home: Walker - 2 wheeled  PLOF: Needs assistance with ADLs- intermittent  PATIENT GOALS  to walk better and be as active as possible. Also to get in/out of cars better.   OBJECTIVE: (objective measures completed at initial evaluation unless otherwise dated)   DIAGNOSTIC FINDINGS: IMPRESSION: 1. Severe lumbar levoscoliosis with widespread advanced disc and facet degeneration. 2. Mild spinal stenosis at L4-5. 3. Moderate neural foraminal stenosis on the right at L3-4 and on the left at L5-S1. 4. Moderate right lateral recess stenosis at L2-3.      LOWER EXTREMITY MMT:    MMT (evaluation)  Right Eval Left Eval  Hip flexion 4 4  Hip extension 4 4  Hip abduction 4 4  Hip adduction 4 4  Hip internal rotation 4 4  Hip external rotation 4 4  Knee flexion 4 4  Knee extension 4 4  Ankle dorsiflexion 4 4  Ankle plantarflexion 4 4  Ankle inversion 4 4  Ankle eversion 4 4  (Blank rows = not tested)     FUNCTIONAL TESTS (EVALUATION):  5 times sit to stand: 14 sec without UE support Timed up and go (TUG): 15.67 sec with RW 10 meter walk test: 10.67 sec = 0.94 m/s using  RW Berg Balance Scale: 48/56- see flowsheet for details  PATIENT SURVEYS:  FOTO : 49 8/24: 47  TODAY'S TREATMENT 11/06/21     Exercise/Activity Sets/ Reps/Time/ Resistance Assistance Charge type Comments  1 LE airex, 1 LE on step  2x45 sec each LE   Neuro re-ed Significant increase in ankle righting, however, able to perform without UE support  NBOS on airex with head turns: vertical and horizontal 2x10 for each   Neuro re-ed   NBOS on airex with EC 45 sec  NMR Rates medium, requires up to min assist to correct decreased postural stability  Hedgehog tap on step X 12 ea LE stepping directly anterior to each foot   NMR Pt reports easier time tapping with the RLE. Decreased eccentric control throughout. Must perform with at least UUE support.  Standing PWR! Up  X 10   NMR Significant cueing required for correct movement pattern, attmept to lean forward upon returning to standing position but with tactile, verbal and demonstration pt able to perform properly   Trials with u step walker  X 150 feet   TA Patient educated regarding benefits of using appropriate assistive device particular benefits of cueing as well as increased stability with this walker compared to walker he is currently using.  Patient does have good stability when ambulating with his walker compared to other walker will also benefit from future where he can sit and rest when he is fatigued.  Treatment provided this session  Patient continues to require occasional rest breaks in order to prevent exacerbation of low back pain During rest breaks pt utilized heat to low back. Reported heat improves back symptoms. No adverse reaction to treatment.     PATIENT EDUCATION: Education details: Pt educated throughout session about proper posture and technique with exercises. Improved exercise technique, movement at target joints, use of target muscles after min to mod verbal, visual, tactile cues.  Person educated: Patient Education  method: Explanation, Demonstration, Tactile cues, and Verbal cues Education comprehension: verbalized understanding, returned demonstration, verbal cues required, tactile cues required, and needs further education   HOME EXERCISE PROGRAM: No updates on  this date, pt to continue HEP as previously given: PWR! Basic 4 seated x 10 ea, handout provided: addended to not perform seated PRW! Rock exercise and added the below to program on 09/25/21  Access Code: [email or web address] URL: [email or web address] Date: 09/25/2021 Prepared by: Rivka Barbara  Exercises - Standing Romberg to 3/4 Tandem Stance  - 1 x daily - 7 x weekly - 2 sets - 30 seocnd hold - Standing March with Counter Support  - 1 x daily - 7 x weekly - 2 sets - 10 reps - Modified Single Leg Balance on Step  - 1 x daily - 7 x weekly - 2 sets - 30 second hold  Access Code: [email or web address] URL: [email or web address] Date: 10/21/2021 Prepared by: Janna Arch  Exercises - Seated Abdominal Press into [employer]  - 1 x daily - 7 x weekly - 2 sets - 10 reps - 5 hold - Seated Flexion Stretch with Swiss Ball  - 1 x daily - 7 x weekly - 2 sets - 10 reps - 5 hold  GOALS: Goals reviewed with patient? Yes  SHORT TERM GOALS: Target date: 10/02/2021  Pt will be independent with initial HEP in order to improve strength and balance in order to decrease fall risk and improve function at home and work.  Baseline: 08/21/2021-No formal HEP in place Goal status: MET    LONG TERM GOALS: Target date: 11/13/2021  Pt will improve BERG by at least 3 points in order to demonstrate clinically significant improvement in balance.    Baseline: 08/21/2021= 48/56 8/24:47/56 Goal status: INITIAL  2.  Pt will be independent with final HEP in order to improve strength and balance in order to decrease fall risk and improve function at home and work.  Baseline: No HEP in place, 8/24: completing current HEP but still progressing  Goal status: IN  PROGRESS  3.  Pt will decrease 5TSTS by at least 4 seconds in order to demonstrate clinically significant improvement in LE strength. Baseline: 08/21/2021= 19.01 sec without UE support 8/24:10.56 sec Goal status: MET  4.  Pt will decrease TUG to below 14 seconds/decrease in order to demonstrate decreased fall risk. Baseline:  08/21/2021=15.67 with RW 8/24: 12.41 Goal status: MET  5.  Pt will improve FOTO to target score of 56 to display perceived improvements in ability to complete ADL's.  Baseline: 08/21/2021=49 8/24: 45.6 Goal status: IN PROGRESS  6.  Pt will increase 10MWT by at least 0.15 m/s in order to demonstrate clinically significant improvement in community ambulation.   Baseline: 08/22/2021=0.94 m/s 8/24:1.20ms Goal status: MET  .7 Pt will improve miniBESTest by at least 4 points to demonstrate clinically significant improvement in reduced falls with parkinsonian related deficits.  Baseline: 19/28 Goal Status: INITIAL   ASSESSMENT:  CLINICAL IMPRESSION:  Author continued plan as indicated in evaluation and previous sessions. Pt requires frequent, brief rest breaks throughout for pain modulation due to LBP. He continues to respond well to heat as treatment adjunct.patient educated thoroughly along with his caregiver regarding proper assistive device utilizations and benefits of appropriate assistive devices.  Patient recommended to use U step walker for features of increased ability as well as cueing in order to improve his shuffling gait to decrease his risk of falls and improve his overall mobility.  Pt will continue to benefit from skilled physical therapy intervention to address impairments, improve QOL, and attain therapy goals.      OBJECTIVE IMPAIRMENTS Abnormal gait, decreased activity tolerance, decreased balance, decreased coordination, decreased endurance,  decreased knowledge of use of DME, decreased mobility, difficulty walking, decreased ROM, decreased strength,  hypomobility, and impaired flexibility.   ACTIVITY LIMITATIONS carrying, lifting, bending, standing, squatting, sleeping, stairs, transfers, continence, bathing, toileting, and dressing  PARTICIPATION LIMITATIONS: cleaning, laundry, medication management, driving, shopping, community activity, and yard work  PERSONAL FACTORS Age are also affecting patient's functional outcome.   REHAB POTENTIAL: Good  CLINICAL DECISION MAKING: Stable/uncomplicated  EVALUATION COMPLEXITY: Low  PLAN: PT FREQUENCY: 2x/week  PT DURATION: 12 weeks  PLANNED INTERVENTIONS: Therapeutic exercises, Therapeutic activity, Neuromuscular re-education, Balance training, Gait training, Patient/Family education, Joint mobilization, Stair training, Vestibular training, Canalith repositioning, DME instructions, Dry Needling, Electrical stimulation, Spinal manipulation, and Spinal mobilization  PLAN FOR NEXT SESSION: continue balance training, monitor hypotension and orthostatics, ROM for flexibility, continue plan  Particia Lather PT  11/06/21, 2:52 PM

## 2021-11-09 ENCOUNTER — Other Ambulatory Visit: Payer: Self-pay | Admitting: Family Medicine

## 2021-11-09 DIAGNOSIS — G894 Chronic pain syndrome: Secondary | ICD-10-CM

## 2021-11-09 DIAGNOSIS — M545 Low back pain, unspecified: Secondary | ICD-10-CM

## 2021-11-11 ENCOUNTER — Encounter: Payer: Medicare HMO | Admitting: Speech Pathology

## 2021-11-11 ENCOUNTER — Ambulatory Visit: Payer: Medicare HMO | Admitting: Physical Therapy

## 2021-11-11 ENCOUNTER — Encounter: Payer: Self-pay | Admitting: Physical Therapy

## 2021-11-11 ENCOUNTER — Ambulatory Visit: Payer: Medicare HMO

## 2021-11-11 DIAGNOSIS — M6281 Muscle weakness (generalized): Secondary | ICD-10-CM

## 2021-11-11 DIAGNOSIS — R262 Difficulty in walking, not elsewhere classified: Secondary | ICD-10-CM

## 2021-11-11 DIAGNOSIS — G20 Parkinson's disease: Secondary | ICD-10-CM | POA: Diagnosis not present

## 2021-11-11 DIAGNOSIS — R2681 Unsteadiness on feet: Secondary | ICD-10-CM | POA: Diagnosis not present

## 2021-11-11 DIAGNOSIS — R269 Unspecified abnormalities of gait and mobility: Secondary | ICD-10-CM

## 2021-11-11 DIAGNOSIS — R278 Other lack of coordination: Secondary | ICD-10-CM | POA: Diagnosis not present

## 2021-11-11 DIAGNOSIS — R2689 Other abnormalities of gait and mobility: Secondary | ICD-10-CM | POA: Diagnosis not present

## 2021-11-11 NOTE — Telephone Encounter (Signed)
Last RF 10/17/21 #90 1 RF  Requested Prescriptions  Refused Prescriptions Disp Refills  . baclofen (LIORESAL) 10 MG tablet [Pharmacy Med Name: BACLOFEN 10 MG TABLET] 90 tablet 1    Sig: TAKE 0.5-1 TABLETS BY MOUTH 3 TIMES DAILY AS NEEDED FOR MUSCLE SPASMS.     Analgesics:  Muscle Relaxants - baclofen Passed - 11/09/2021 10:31 AM      Passed - Cr in normal range and within 180 days    Creat  Date Value Ref Range Status  10/10/2021 0.77 0.70 - 1.28 mg/dL Final         Passed - eGFR is 30 or above and within 180 days    GFR, Est African American  Date Value Ref Range Status  09/07/2019 100 > OR = 60 mL/min/1.2m Final   GFR, Est Non African American  Date Value Ref Range Status  09/07/2019 86 > OR = 60 mL/min/1.776mFinal   GFR, Estimated  Date Value Ref Range Status  03/24/2021 >60 >60 mL/min Final    Comment:    (NOTE) Calculated using the CKD-EPI Creatinine Equation (2021)    eGFR  Date Value Ref Range Status  10/10/2021 91 > OR = 60 mL/min/1.7375minal         Passed - Valid encounter within last 6 months    Recent Outpatient Visits          [facility]   7 months ago Chronic left-sided low back pain without sciatica   [place]   9 months ago Chronic left-sided low back pain without sciatica   [place]   12 months ago [facility] left-sided low back pain without sciatica   [facility]   1 year ago Annual physical exam   [facility]

## 2021-11-11 NOTE — Therapy (Signed)
OUTPATIENT PHYSICAL THERAPY NEURO    Patient Name: Joseph Arroyo MRN: [id number] DOB:Jul 22, 1940, 81 y.o., male Today's Date: 11/11/2021   PCP: Joseph Hauser, DO REFERRING PROVIDER: Lavena Bullion, NP   PT End of Session - 11/11/21 1352     Visit Number 15    Number of Visits 25    Date for PT Re-Evaluation 11/13/21    Authorization Type [employer]    Authorization Time Period Initial Cert 11/21/1939- 7/40/8144    Progress Note Due on Visit 20    PT Start Time 1349    PT Stop Time 1430    PT Time Calculation (min) 41 min    Equipment Utilized During Treatment Gait belt    Activity Tolerance Patient tolerated treatment well    Behavior During Therapy [facility] for tasks assessed/performed                     Past Medical History:  Diagnosis Date   Allergic rhinitis due to pollen 11/21/2007   Brachial neuritis or radiculitis NOS    Cervicalgia    Concussion    age 89 - s/p accident   Costal chondritis    Depression    Essential and other specified forms of tremor    GERD (gastroesophageal reflux disease)    in past   Lumbago    Occlusion and stenosis of carotid artery without mention of cerebral infarction    Seizures [facility])    age 27 - after concussion   Past Surgical History:  Procedure Laterality Date   CATARACT EXTRACTION Right 07/18/14   CATARACT EXTRACTION W/PHACO Left 08/01/2014   Procedure: CATARACT EXTRACTION PHACO AND INTRAOCULAR LENS PLACEMENT ([place]);  Surgeon: Joseph Koyanagi, MD;  Location: [place];  Service: Ophthalmology;  Laterality: Left;  IVA TOPICAL   COLONOSCOPY  2008   OTHER SURGICAL HISTORY  2002   ear surgery   STAPEDES SURGERY Right 2002   Patient Active Problem List   Diagnosis Date Noted   Dysphagia, pharyngeal phase 09/17/2021   Cervicalgia 08/05/2021   Spondylosis without myelopathy or radiculopathy, lumbosacral region 06/24/2021   Lumbosacral facet hypertrophy 06/03/2021   Lumbar central  spinal stenosis, w/o neurogenic claudication (L4-5) 06/03/2021   Lumbar lateral recess stenosis (Bilateral: L2-3, L4-5) (Right: L3-4) 06/03/2021   Lumbosacral foraminal stenosis (Bilateral: L2-3, L3-4) (Left: L5-S1) 06/03/2021   Lumbar nerve root impingement (Right: L3 at L2-3 & L3-4) 06/03/2021   Ligamentum flavum hypertrophy (L3-4, L4-5) 06/03/2021   Abnormal MRI, lumbar spine (04/23/2021) 04/24/2021   Long term prescription benzodiazepine use (alprazolam) (Xanax) 03/24/2021   Grade 1 Retrolisthesis of L2/L3 (5 mm) and L3/L4 (3 mm) 03/24/2021   Levoscoliosis of lumbar spine (L3-4 apex) 03/24/2021   DDD (degenerative disc disease), lumbosacral 03/24/2021   Lumbosacral facet arthropathy (Left: L3-4, L4-5, and L5-S1) 03/24/2021   Tricompartment osteoarthritis of knee (Left) 03/24/2021   Baker cyst (Left) 03/24/2021   Chronic low back pain (1ry area of Pain) (Bilateral) (R>L) w/o sciatica 03/24/2021   Lumbar facet syndrome (Bilateral) 03/24/2021   Chronic lower extremity pain (2ry area of Pain) (Bilateral) (L>R) 03/24/2021   Lumbosacral radiculitis/sensory radiculopathy at L2 (Bilateral) 03/24/2021   Lumbosacral radiculitis/sensory radiculopathy at L3 (Bilateral) 03/24/2021   Chronic pain syndrome 03/23/2021   Pharmacologic therapy 03/23/2021   Disorder of skeletal system 03/23/2021   Problems influencing health status 03/23/2021   PAD (peripheral artery disease) ([place]) 10/01/2020   GAD (generalized anxiety disorder) 01/13/2018   MDD (major depressive disorder) 01/13/2018  Umbilical hernia 53/29/9242   Chronic knee pain (Left) 08/11/2017   Derangement of medial meniscus, posterior horn (Left) 08/11/2017   Vitamin D insufficiency 03/05/2016   BPH without obstruction/lower urinary tract symptoms 07/08/2015   Chronic fatigue 10/23/2014   Parkinson's disease ([place]) 09/20/2014   Major depression, recurrent, full remission ([place]) 07/26/2013   Unsteady gait 07/26/2013   Orthostatic hypotension  07/26/2013   Depression 07/26/2013   Anxiety 06/23/2013   Chronic anxiety 06/23/2013   Tremor 05/18/2013   Bilateral carotid artery stenosis 08/30/2012    ONSET DATE: 08/11/2021  REFERRING DIAG: Parkinsons Disease  THERAPY DIAG:  Muscle weakness (generalized)  Unsteadiness on feet  Difficulty in walking, not elsewhere classified  Abnormality of gait and mobility  Other abnormalities of gait and mobility  Rationale for Evaluation and Treatment Rehabilitation  SUBJECTIVE:                                                                                                                                                                                              SUBJECTIVE STATEMENT: Patient reports no falls or loss of balance since previous session.   Pt accompanied by: significant other  PERTINENT HISTORY: Per chart history provided by Dr. Consuela Arroyo- Joseph Arroyo has Parkinson's disease ([place]); Chronic knee pain (Left); Derangement of medial meniscus, posterior horn (Left); PAD (peripheral artery disease) ([place]); Chronic pain syndrome; Grade 1 Retrolisthesis of L2/L3 (5 mm) and L3/L4 (3 mm); Levoscoliosis of lumbar spine (L3-4 apex); DDD (degenerative disc disease), lumbosacral; Lumbosacral facet arthropathy (Left: L3-4, L4-5, and L5-S1); Tricompartment osteoarthritis of knee (Left); Baker cyst (Left); Chronic low back pain (1ry area of Pain) (Bilateral) (R>L) w/o sciatica; Lumbar facet syndrome (Bilateral); Chronic lower extremity pain (2ry area of Pain) (Bilateral) (L>R); Lumbosacral radiculitis/sensory radiculopathy at L2 (Bilateral); Lumbosacral radiculitis/sensory radiculopathy at L3 (Bilateral); Abnormal MRI, lumbar spine (04/23/2021); Lumbosacral facet hypertrophy; Lumbar central spinal stenosis, w/o neurogenic claudication (L4-5); Lumbar lateral recess stenosis (Bilateral: L2-3, L4-5) (Right: L3-4); Lumbosacral foraminal stenosis (Bilateral: L2-3, L3-4) (Left: L5-S1); Lumbar nerve root  impingement (Right: L3 at L2-3 & L3-4); Ligamentum flavum hypertrophy (L3-4, L4-5); Spondylosis without myelopathy or radiculopathy, lumbosacral region; and Cervicalgia on their pertinent problem list   Patient has appointment for Epidural to low back next Tues 08/26/2021  PAIN:  Are you having pain? Yes: NPRS scale: 4/10 Pain location: low back Pain description: ache/soreness Aggravating factors: prolonged standing, transfers, walking Relieving factors: rest and medications  PRECAUTIONS: Fall  WEIGHT BEARING RESTRICTIONS No  FALLS: Has patient fallen in last 6 months? Yes. Number of falls 2  LIVING ENVIRONMENT: Lives with: lives with their spouse Lives in: House/apartment  Stairs: 4 Has following equipment at home: Walker - 2 wheeled  PLOF: Needs assistance with ADLs- intermittent  PATIENT GOALS  to walk better and be as active as possible. Also to get in/out of cars better.   OBJECTIVE: (objective measures completed at initial evaluation unless otherwise dated)   DIAGNOSTIC FINDINGS: IMPRESSION: 1. Severe lumbar levoscoliosis with widespread advanced disc and facet degeneration. 2. Mild spinal stenosis at L4-5. 3. Moderate neural foraminal stenosis on the right at L3-4 and on the left at L5-S1. 4. Moderate right lateral recess stenosis at L2-3.      LOWER EXTREMITY MMT:    MMT (evaluation)  Right Eval Left Eval  Hip flexion 4 4  Hip extension 4 4  Hip abduction 4 4  Hip adduction 4 4  Hip internal rotation 4 4  Hip external rotation 4 4  Knee flexion 4 4  Knee extension 4 4  Ankle dorsiflexion 4 4  Ankle plantarflexion 4 4  Ankle inversion 4 4  Ankle eversion 4 4  (Blank rows = not tested)     FUNCTIONAL TESTS (EVALUATION):  5 times sit to stand: 14 sec without UE support Timed up and go (TUG): 15.67 sec with RW 10 meter walk test: 10.67 sec = 0.94 m/s using RW Berg Balance Scale: 48/56- see flowsheet for details  PATIENT SURVEYS:  FOTO :  49 8/24: 47  TODAY'S TREATMENT 11/11/21     Exercise/Activity Sets/ Reps/Time/ Resistance Assistance Charge type Comments  1 LE airex, 1 LE on step  2x45 sec each LE  1x 45 seconds with eyes closed   Neuro re-ed Significant increase in ankle righting, however, able to perform without UE support  STS with UE horizontal abduction  X 10   TA   NBOS on airex with eyes closed  *60 seconds   Neuro re-ed   NBOS on airex with velcroball toss  Wide semi-tandem an airex with velcro ball toss X 10 throws   X 10 throws with ea LE posterior   NMR Rates medium, requires up to min assist to correct decreased postural stability  Hedgehog tap on step  X 12 ea LE stepping directly anterior to each foot  X 12 lateral stepping   NMR Increased difficulty with lateral L Le step taps, 1 LOB in this direction corrected with UE support   Standing PWR! Step  X 10 ea side   NMR         Treatment provided this session  Patient continues to require occasional rest breaks in order to prevent exacerbation of low back pain During rest breaks pt utilized heat to low back. Reported heat improves back symptoms. No adverse reaction to treatment.     PATIENT EDUCATION: Education details: Pt educated throughout session about proper posture and technique with exercises. Improved exercise technique, movement at target joints, use of target muscles after min to mod verbal, visual, tactile cues.  Person educated: Patient Education method: Explanation, Demonstration, Tactile cues, and Verbal cues Education comprehension: verbalized understanding, returned demonstration, verbal cues required, tactile cues required, and needs further education   HOME EXERCISE PROGRAM: No updates on this date, pt to continue HEP as previously given: PWR! Basic 4 seated x 10 ea, handout provided: addended to not perform seated PRW! Rock exercise and added the below to program on 09/25/21  Access Code: [email or web address] URL:  [email or web address] Date: 09/25/2021 Prepared by: Rivka Barbara  Exercises - Standing Romberg to 3/4 Tandem Stance  - 1 x daily -  7 x weekly - 2 sets - 30 seocnd hold - Standing March with Counter Support  - 1 x daily - 7 x weekly - 2 sets - 10 reps - Modified Single Leg Balance on Step  - 1 x daily - 7 x weekly - 2 sets - 30 second hold  Access Code: [email or web address] URL: [email or web address] Date: 10/21/2021 Prepared by: Janna Arch  Exercises - Seated Abdominal Press into [employer]  - 1 x daily - 7 x weekly - 2 sets - 10 reps - 5 hold - Seated Flexion Stretch with Swiss Ball  - 1 x daily - 7 x weekly - 2 sets - 10 reps - 5 hold  GOALS: Goals reviewed with patient? Yes  SHORT TERM GOALS: Target date: 10/02/2021  Pt will be independent with initial HEP in order to improve strength and balance in order to decrease fall risk and improve function at home and work.  Baseline: 08/21/2021-No formal HEP in place Goal status: MET    LONG TERM GOALS: Target date: 11/13/2021  Pt will improve BERG by at least 3 points in order to demonstrate clinically significant improvement in balance.    Baseline: 08/21/2021= 48/56 8/24:47/56 Goal status: INITIAL  2.  Pt will be independent with final HEP in order to improve strength and balance in order to decrease fall risk and improve function at home and work.  Baseline: No HEP in place, 8/24: completing current HEP but still progressing  Goal status: IN PROGRESS  3.  Pt will decrease 5TSTS by at least 4 seconds in order to demonstrate clinically significant improvement in LE strength. Baseline: 08/21/2021= 19.01 sec without UE support 8/24:10.56 sec Goal status: MET  4.  Pt will decrease TUG to below 14 seconds/decrease in order to demonstrate decreased fall risk. Baseline:  08/21/2021=15.67 with RW 8/24: 12.41 Goal status: MET  5.  Pt will improve FOTO to target score of 56 to display perceived improvements in  ability to complete ADL's.  Baseline: 08/21/2021=49 8/24: 45.6 Goal status: IN PROGRESS  6.  Pt will increase 10MWT by at least 0.15 m/s in order to demonstrate clinically significant improvement in community ambulation.   Baseline: 08/22/2021=0.94 m/s 8/24:1.50ms Goal status: MET  7 Pt will improve miniBESTest by at least 4 points to demonstrate clinically significant improvement in reduced falls with parkinsonian related deficits.  Baseline: 19/28 Goal Status: INITIAL   ASSESSMENT:  CLINICAL IMPRESSION:  Author continued plan as indicated in evaluation and previous sessions. Pt requires frequent, brief rest breaks throughout for pain modulation due to LBP. Pt continuing to show good motivation for completion of PT activities.   Pt will continue to benefit from skilled physical therapy intervention to address impairments, improve QOL, and attain therapy goals.      OBJECTIVE IMPAIRMENTS Abnormal gait, decreased activity tolerance, decreased balance, decreased coordination, decreased endurance, decreased knowledge of use of DME, decreased mobility, difficulty walking, decreased ROM, decreased strength, hypomobility, and impaired flexibility.   ACTIVITY LIMITATIONS carrying, lifting, bending, standing, squatting, sleeping, stairs, transfers, continence, bathing, toileting, and dressing  PARTICIPATION LIMITATIONS: cleaning, laundry, medication management, driving, shopping, community activity, and yard work  PERSONAL FACTORS Age are also affecting patient's functional outcome.   REHAB POTENTIAL: Good  CLINICAL DECISION MAKING: Stable/uncomplicated  EVALUATION COMPLEXITY: Low  PLAN: PT FREQUENCY: 2x/week  PT DURATION: 12 weeks  PLANNED INTERVENTIONS: Therapeutic exercises, Therapeutic activity, Neuromuscular re-education, Balance training, Gait training, Patient/Family education, Joint mobilization, Stair training, Vestibular training, Canalith repositioning, DME instructions, Dry  Needling,  Electrical stimulation, Spinal manipulation, and Spinal mobilization  PLAN FOR NEXT SESSION: continue balance training, monitor hypotension and orthostatics, ROM for flexibility, continue plan  Particia Lather PT  11/11/21, 1:52 PM

## 2021-11-11 NOTE — Therapy (Signed)
OUTPATIENT OCCUPATIONAL THERAPY NEURO RECERTIFICATION NOTE  Patient Name: Joseph Arroyo MRN: [id number] DOB:September 18, 1940, 81 y.o., male Today's Date: 11/11/2021  PCP: Dr. Nobie Putnam REFERRING PROVIDER: Dr. Lavena Bullion     OT End of Session - 11/11/21 1726     Visit Number 16    Number of Visits 20    Date for OT Re-Evaluation 12/09/21    Authorization Time Period Reporting period beginning 10/23/21    OT Start Time 1430    OT Stop Time 1515    OT Time Calculation (min) 45 min    Equipment Utilized During Treatment RW    Activity Tolerance Patient tolerated treatment well    Behavior During Therapy [facility] for tasks assessed/performed             Past Medical History:  Diagnosis Date   Allergic rhinitis due to pollen 11/21/2007   Brachial neuritis or radiculitis NOS    Cervicalgia    Concussion    age 79 - s/p accident   Costal chondritis    Depression    Essential and other specified forms of tremor    GERD (gastroesophageal reflux disease)    in past   Lumbago    Occlusion and stenosis of carotid artery without mention of cerebral infarction    Seizures [facility])    age 77 - after concussion   Past Surgical History:  Procedure Laterality Date   CATARACT EXTRACTION Right 07/18/14   CATARACT EXTRACTION W/PHACO Left 08/01/2014   Procedure: CATARACT EXTRACTION PHACO AND INTRAOCULAR LENS PLACEMENT ([place]);  Surgeon: Leandrew Koyanagi, MD;  Location: [place];  Service: Ophthalmology;  Laterality: Left;  IVA TOPICAL   COLONOSCOPY  2008   OTHER SURGICAL HISTORY  2002   ear surgery   STAPEDES SURGERY Right 2002   Patient Active Problem List   Diagnosis Date Noted   Dysphagia, pharyngeal phase 09/17/2021   Cervicalgia 08/05/2021   Spondylosis without myelopathy or radiculopathy, lumbosacral region 06/24/2021   Lumbosacral facet hypertrophy 06/03/2021   Lumbar central spinal stenosis, w/o neurogenic claudication (L4-5) 06/03/2021   Lumbar  lateral recess stenosis (Bilateral: L2-3, L4-5) (Right: L3-4) 06/03/2021   Lumbosacral foraminal stenosis (Bilateral: L2-3, L3-4) (Left: L5-S1) 06/03/2021   Lumbar nerve root impingement (Right: L3 at L2-3 & L3-4) 06/03/2021   Ligamentum flavum hypertrophy (L3-4, L4-5) 06/03/2021   Abnormal MRI, lumbar spine (04/23/2021) 04/24/2021   Long term prescription benzodiazepine use (alprazolam) (Xanax) 03/24/2021   Grade 1 Retrolisthesis of L2/L3 (5 mm) and L3/L4 (3 mm) 03/24/2021   Levoscoliosis of lumbar spine (L3-4 apex) 03/24/2021   DDD (degenerative disc disease), lumbosacral 03/24/2021   Lumbosacral facet arthropathy (Left: L3-4, L4-5, and L5-S1) 03/24/2021   Tricompartment osteoarthritis of knee (Left) 03/24/2021   Baker cyst (Left) 03/24/2021   Chronic low back pain (1ry area of Pain) (Bilateral) (R>L) w/o sciatica 03/24/2021   Lumbar facet syndrome (Bilateral) 03/24/2021   Chronic lower extremity pain (2ry area of Pain) (Bilateral) (L>R) 03/24/2021   Lumbosacral radiculitis/sensory radiculopathy at L2 (Bilateral) 03/24/2021   Lumbosacral radiculitis/sensory radiculopathy at L3 (Bilateral) 03/24/2021   Chronic pain syndrome 03/23/2021   Pharmacologic therapy 03/23/2021   Disorder of skeletal system 03/23/2021   Problems influencing health status 03/23/2021   PAD (peripheral artery disease) ([place]) 10/01/2020   GAD (generalized anxiety disorder) 01/13/2018   MDD (major depressive disorder) 70/96/2836   Umbilical hernia 62/94/7654   Chronic knee pain (Left) 08/11/2017   Derangement of medial meniscus, posterior horn (Left) 08/11/2017   Vitamin D  insufficiency 03/05/2016   BPH without obstruction/lower urinary tract symptoms 07/08/2015   Chronic fatigue 10/23/2014   Parkinson's disease ([place]) 09/20/2014   Major depression, recurrent, full remission ([place]) 07/26/2013   Unsteady gait 07/26/2013   Orthostatic hypotension 07/26/2013   Depression 07/26/2013   Anxiety 06/23/2013   Chronic  anxiety 06/23/2013   Tremor 05/18/2013   Bilateral carotid artery stenosis 08/30/2012    ONSET DATE: 08/11/21 date of referral.  PD symptoms since 2015.  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  PERTINENT HISTORY: Per chart, pt with hx of Parkinson's disease ([place]); Chronic knee pain (Left); Derangement of medial meniscus, posterior horn (Left); PAD (peripheral artery disease) ([place]); Chronic pain syndrome; DDD (degenerative disc disease) lumbosacral; osteoarthritis of knee (Left); Chronic low back pain, Lumbar facet syndrome (Bilateral); Chronic lower extremity pain. PRECAUTIONS: Fall  WEIGHT BEARING RESTRICTIONS No  PAIN:  Are you having pain? Yes: NPRS scale:   4-5/10 Pain location: low back Pain description: achy Aggravating factors: N/A, constant Relieving factors: rest, repositioning , heat  PATIENT GOALS : Increase efficiency with daily tasks, increase FMC skills   OBJECTIVE:  HAND FUNCTION: Grip strength: Right: 46 lbs; Left: 54 lbs, Lateral pinch: Right: 12 lbs, Left: 9 lbs, and 3 point pinch: Right: 11 lbs, Left: 9 lbs   COORDINATION: 9 Hole Peg test: Right: 30.3 sec; Left: 34.3 sec  TODAY'S TREATMENT:  Therapeutic Exercise: Objective measures taken and goals updated for recert.  Self Care: Provided education and visuals of various types of elastic laces, along with visual of velcro adapted buttons.  Spouse reports she's also looked into magnetic buttons for a men's shirt but they are costly.  Pt would benefit from button hook demo next session, but limited today by time constrains with recert.  Reviewed/completed LB dressing with use of reacher and long shoe horn for  donning/doffing socks and shoes.  Pt required min vc to utilize reacher to minimize bending to doff sock.  Pt demonstrated ability to tie shoes by crossing legs but stated that this did strain his back; OT reinforced use of step stool or elastic laces as an alternate option and pt stated that the foot stool used last session was effective.    PATIENT EDUCATION: Education details: dressing AE Person educated: Patient and Spouse Education method: Explanation and Verbal cues Education comprehension: verbalized understanding, returned demo   HOME EXERCISE PROGRAM:  BUE strengthening with yellow/red theraband   GOALS: Goals reviewed with patient? Yes  SHORT TERM GOALS: Target date: 09/30/21  Pt will be indep to perform HEP for increasing strength and coordination in B hands.  Baseline: HEP not yet initiated; 10th: pt performs yellow theraband and theraputty exercises regularly and independently Goal status: achieved  2.  Pt will be indep to verbalize 3 fall prevention strategies to implement during ADLs.  Baseline: Not yet initiated; 10th: Pt is able to verbalize 3+ strategies for fall prevention (ie consistent use of walker, slow positional changes, removal of throw rugs, keep pathways clear)  Goal status: achieved   3 LONG TERM GOALS: 12/09/21  Pt will increase FOTO score by 3 or more points to indicate increased perceived functional performance with daily tasks.  Baseline: FOTO 60; 10th: will complete next session (  therapist's computer unavailable); 11/11/21: 49 Goal status: ongoing  2.  Pt will be able to efficiently send short text messages to family/friends with modified techniques as needed (ie: use of stylus, voice to text) Baseline: Spouse sends texts for pt; 10th: pt started practicing with use of stylus and will continue to assess if efficiency is better with or without; pt requires reminders to use predictive texting  Goal status: ongoing  3.  Pt will fill out a check for  bill paying with 90% legibility with adapted pen as needed. Baseline: Pt reports inconsistent legibility; 100% legibility with compensatory strategies: Forearm support on table top, postural corrections, wide grip pen Goal status: achieved  4.  Pt will increase efficiency with dressing tasks, donning clothes in 15 min or less. Baseline: Spouse reports it can take pt 20-30 min to dress in the morning; 11/11/21: Pt is using reacher and long shoe horn with min vc; hip kit will arrive this week and pt will benefit from training with dressing stick to don coat and button hook  Goal status: ongoing    ASSESSMENT:  CLINICAL IMPRESSION:  OT recert completed this date.  Pt reports that using a chair with arm rests for dressing has been very effective.  Pt ordered a hip kit which should arrive this week and will benefit from training with dressing stick to don coat and trial of a button hook.  Pt currently requires min vc to use reacher and long shoe horn for donning/doffing socks and shoes.  Pt will benefit from an additional 4 sessions for ADL/AE training with new equipment, and work towards remaining goals in [place] to maximize safety and indep with daily tasks.    PERFORMANCE DEFICITS in functional skills including ADLs, IADLs, coordination, dexterity, ROM, strength, pain, flexibility, FMC, and GMC, cognitive skills including safety awareness.  IMPAIRMENTS are limiting patient from ADLs, IADLs, and leisure.   COMORBIDITIES has co-morbidities such as scoliosis, chronic back pain and leg pain  that affects occupational performance. Patient will benefit from skilled OT to address above impairments and improve overall function.  MODIFICATION OR ASSISTANCE TO COMPLETE EVALUATION: No modification of tasks or assist necessary to complete an evaluation.  OT OCCUPATIONAL PROFILE AND HISTORY: Problem focused assessment: Including review of records relating to presenting problem.  CLINICAL DECISION MAKING:  Moderate - several treatment options, min-mod task modification necessary  REHAB POTENTIAL: Good  EVALUATION COMPLEXITY: Moderate    PLAN: OT FREQUENCY: 2x/week for 2 weeks (4 additional sessions)  OT DURATION: 2 weeks (re-eval date in 4 weeks in case of missed or rescheduled sessions)  PLANNED INTERVENTIONS: self care/ADL training, therapeutic exercise, therapeutic activity, neuromuscular re-education, manual therapy, passive range of motion, balance training, functional mobility training, moist heat, cryotherapy, patient/family education, cognitive remediation/compensation, energy conservation, coping strategies training, and DME and/or AE instructions  RECOMMENDED OTHER SERVICES: N/A  CONSULTED AND AGREED WITH PLAN OF CARE: Patient and family member/caregiver  PLAN FOR NEXT SESSION: See above  Leta Speller, MS, OTR/L   Darleene Cleaver, OT 11/11/2021, 5:31 PM

## 2021-11-13 ENCOUNTER — Ambulatory Visit: Payer: Medicare HMO | Admitting: Occupational Therapy

## 2021-11-13 ENCOUNTER — Ambulatory Visit: Payer: Medicare HMO

## 2021-11-13 ENCOUNTER — Encounter: Payer: Self-pay | Admitting: Occupational Therapy

## 2021-11-13 ENCOUNTER — Encounter: Payer: Medicare HMO | Admitting: Speech Pathology

## 2021-11-13 DIAGNOSIS — R278 Other lack of coordination: Secondary | ICD-10-CM

## 2021-11-13 DIAGNOSIS — G20 Parkinson's disease: Secondary | ICD-10-CM

## 2021-11-13 DIAGNOSIS — R262 Difficulty in walking, not elsewhere classified: Secondary | ICD-10-CM | POA: Diagnosis not present

## 2021-11-13 DIAGNOSIS — R2681 Unsteadiness on feet: Secondary | ICD-10-CM | POA: Diagnosis not present

## 2021-11-13 DIAGNOSIS — R2689 Other abnormalities of gait and mobility: Secondary | ICD-10-CM

## 2021-11-13 DIAGNOSIS — R269 Unspecified abnormalities of gait and mobility: Secondary | ICD-10-CM | POA: Diagnosis not present

## 2021-11-13 DIAGNOSIS — M6281 Muscle weakness (generalized): Secondary | ICD-10-CM

## 2021-11-13 NOTE — Therapy (Signed)
OUTPATIENT OCCUPATIONAL THERAPY NEURO RECERTIFICATION NOTE  Patient Name: Joseph Arroyo MRN: [id number] DOB:20-Aug-1940, 81 y.o., male Today's Date: 11/13/2021  PCP: Dr. Nobie Putnam REFERRING PROVIDER: Dr. Lavena Bullion     OT End of Session - 11/13/21 1138     Visit Number 17    Number of Visits 20    Date for OT Re-Evaluation 12/09/21    Authorization Time Period Reporting period beginning 10/23/21    OT Start Time 1155    OT Stop Time 35    OT Time Calculation (min) 35 min    Equipment Utilized During Treatment RW    Activity Tolerance Patient tolerated treatment well    Behavior During Therapy [facility] for tasks assessed/performed             Past Medical History:  Diagnosis Date   Allergic rhinitis due to pollen 11/21/2007   Brachial neuritis or radiculitis NOS    Cervicalgia    Concussion    age 66 - s/p accident   Costal chondritis    Depression    Essential and other specified forms of tremor    GERD (gastroesophageal reflux disease)    in past   Lumbago    Occlusion and stenosis of carotid artery without mention of cerebral infarction    Seizures [facility])    age 20 - after concussion   Past Surgical History:  Procedure Laterality Date   CATARACT EXTRACTION Right 07/18/14   CATARACT EXTRACTION W/PHACO Left 08/01/2014   Procedure: CATARACT EXTRACTION PHACO AND INTRAOCULAR LENS PLACEMENT ([place]);  Surgeon: Leandrew Koyanagi, MD;  Location: [place];  Service: Ophthalmology;  Laterality: Left;  IVA TOPICAL   COLONOSCOPY  2008   OTHER SURGICAL HISTORY  2002   ear surgery   STAPEDES SURGERY Right 2002   Patient Active Problem List   Diagnosis Date Noted   Dysphagia, pharyngeal phase 09/17/2021   Cervicalgia 08/05/2021   Spondylosis without myelopathy or radiculopathy, lumbosacral region 06/24/2021   Lumbosacral facet hypertrophy 06/03/2021   Lumbar central spinal stenosis, w/o neurogenic claudication (L4-5) 06/03/2021   Lumbar  lateral recess stenosis (Bilateral: L2-3, L4-5) (Right: L3-4) 06/03/2021   Lumbosacral foraminal stenosis (Bilateral: L2-3, L3-4) (Left: L5-S1) 06/03/2021   Lumbar nerve root impingement (Right: L3 at L2-3 & L3-4) 06/03/2021   Ligamentum flavum hypertrophy (L3-4, L4-5) 06/03/2021   Abnormal MRI, lumbar spine (04/23/2021) 04/24/2021   Long term prescription benzodiazepine use (alprazolam) (Xanax) 03/24/2021   Grade 1 Retrolisthesis of L2/L3 (5 mm) and L3/L4 (3 mm) 03/24/2021   Levoscoliosis of lumbar spine (L3-4 apex) 03/24/2021   DDD (degenerative disc disease), lumbosacral 03/24/2021   Lumbosacral facet arthropathy (Left: L3-4, L4-5, and L5-S1) 03/24/2021   Tricompartment osteoarthritis of knee (Left) 03/24/2021   Baker cyst (Left) 03/24/2021   Chronic low back pain (1ry area of Pain) (Bilateral) (R>L) w/o sciatica 03/24/2021   Lumbar facet syndrome (Bilateral) 03/24/2021   Chronic lower extremity pain (2ry area of Pain) (Bilateral) (L>R) 03/24/2021   Lumbosacral radiculitis/sensory radiculopathy at L2 (Bilateral) 03/24/2021   Lumbosacral radiculitis/sensory radiculopathy at L3 (Bilateral) 03/24/2021   Chronic pain syndrome 03/23/2021   Pharmacologic therapy 03/23/2021   Disorder of skeletal system 03/23/2021   Problems influencing health status 03/23/2021   PAD (peripheral artery disease) ([place]) 10/01/2020   GAD (generalized anxiety disorder) 01/13/2018   MDD (major depressive disorder) 66/08/43   Umbilical hernia 99/77/4142   Chronic knee pain (Left) 08/11/2017   Derangement of medial meniscus, posterior horn (Left) 08/11/2017   Vitamin D  insufficiency 03/05/2016   BPH without obstruction/lower urinary tract symptoms 07/08/2015   Chronic fatigue 10/23/2014   Parkinson's disease ([place]) 09/20/2014   Major depression, recurrent, full remission ([place]) 07/26/2013   Unsteady gait 07/26/2013   Orthostatic hypotension 07/26/2013   Depression 07/26/2013   Anxiety 06/23/2013   Chronic  anxiety 06/23/2013   Tremor 05/18/2013   Bilateral carotid artery stenosis 08/30/2012    ONSET DATE: 08/11/21 date of referral.  PD symptoms since 2015.  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  PERTINENT HISTORY: Per chart, pt with hx of Parkinson's disease ([place]); Chronic knee pain (Left); Derangement of medial meniscus, posterior horn (Left); PAD (peripheral artery disease) ([place]); Chronic pain syndrome; DDD (degenerative disc disease) lumbosacral; osteoarthritis of knee (Left); Chronic low back pain, Lumbar facet syndrome (Bilateral); Chronic lower extremity pain. PRECAUTIONS: Fall  WEIGHT BEARING RESTRICTIONS No  PAIN:  Are you having pain? Yes: NPRS scale:   4/10 Pain location: low back Pain description: achy Aggravating factors: N/A, constant Relieving factors: rest, repositioning , heat  PATIENT GOALS : Increase efficiency with daily tasks, increase FMC skills   OBJECTIVE:  HAND FUNCTION: Grip strength: Right: 46 lbs; Left: 54 lbs, Lateral pinch: Right: 12 lbs, Left: 9 lbs, and 3 point pinch: Right: 11 lbs, Left: 9 lbs   COORDINATION: 9 Hole Peg test: Right: 30.3 sec; Left: 34.3 sec  TODAY'S TREATMENT:  Self Care: Provided education and demonstration of button hook demo. Pt unsure if it will work well if he has to use his L hand, but greatly improved for buttoning on table. Recommend use of mirror for buttoning and half buttoning before donning shirts. Reviewed LB dressing with use of reacher and long shoe horn for donning/doffing socks and shoes with no cues.    PATIENT EDUCATION: Education details: dressing AE Person educated: Patient and Spouse Education method:  Explanation and Verbal cues Education comprehension: verbalized understanding, returned demo   HOME EXERCISE PROGRAM:  BUE strengthening with yellow/red theraband   GOALS: Goals reviewed with patient? Yes  SHORT TERM GOALS: Target date: 09/30/21  Pt will be indep to perform HEP for increasing strength and coordination in B hands.  Baseline: HEP not yet initiated; 10th: pt performs yellow theraband and theraputty exercises regularly and independently Goal status: achieved  2.  Pt will be indep to verbalize 3 fall prevention strategies to implement during ADLs.  Baseline: Not yet initiated; 10th: Pt is able to verbalize 3+ strategies for fall prevention (ie consistent use of walker, slow positional changes, removal of throw rugs, keep pathways clear)  Goal status: achieved   3 LONG TERM GOALS: 12/09/21  Pt will increase FOTO score by 3 or more points to indicate increased perceived functional performance with daily tasks.  Baseline: FOTO 60; 10th: will complete next session (therapist's computer unavailable); 11/11/21: 49 Goal status: ongoing  2.  Pt will be able to efficiently send short text messages to family/friends with modified techniques as needed (ie: use of stylus, voice to text) Baseline: Spouse sends texts for pt; 10th: pt started practicing with use of stylus and will continue to assess if efficiency is better with or without; pt requires reminders to use predictive texting  Goal status: ongoing  3.  Pt  will fill out a check for bill paying with 90% legibility with adapted pen as needed. Baseline: Pt reports inconsistent legibility; 100% legibility with compensatory strategies: Forearm support on table top, postural corrections, wide grip pen Goal status: achieved  4.  Pt will increase efficiency with dressing tasks, donning clothes in 15 min or less. Baseline: Spouse reports it can take pt 20-30 min to dress in the morning; 11/11/21: Pt is using reacher and long shoe horn  with min vc; hip kit will arrive this week and pt will benefit from training with dressing stick to don coat and button hook  Goal status: ongoing    ASSESSMENT:  CLINICAL IMPRESSION: Pt continues to wait for hip kit, trialed button hook this date and reviewed long handled shoe horn / elastic shoe laces. Increased time but no cues using shoe horn. Greatly improved buttoning using button hook with shirt on table however increased difficulty using on his shirt 2/2 position of buttons requiring him to use L hand. Improved with use of mirror. Pt will benefit from additional sessions for ADL/AE training with new equipment, and work towards remaining goals in [place] to maximize safety and indep with daily tasks.    PERFORMANCE DEFICITS in functional skills including ADLs, IADLs, coordination, dexterity, ROM, strength, pain, flexibility, FMC, and GMC, cognitive skills including safety awareness.  IMPAIRMENTS are limiting patient from ADLs, IADLs, and leisure.   COMORBIDITIES has co-morbidities such as scoliosis, chronic back pain and leg pain  that affects occupational performance. Patient will benefit from skilled OT to address above impairments and improve overall function.  MODIFICATION OR ASSISTANCE TO COMPLETE EVALUATION: No modification of tasks or assist necessary to complete an evaluation.  OT OCCUPATIONAL PROFILE AND HISTORY: Problem focused assessment: Including review of records relating to presenting problem.  CLINICAL DECISION MAKING: Moderate - several treatment options, min-mod task modification necessary  REHAB POTENTIAL: Good  EVALUATION COMPLEXITY: Moderate    PLAN: OT FREQUENCY: 2x/week for 2 weeks (4 additional sessions)  OT DURATION: 2 weeks (re-eval date in 4 weeks in case of missed or rescheduled sessions)  PLANNED INTERVENTIONS: self care/ADL training, therapeutic exercise, therapeutic activity, neuromuscular re-education, manual therapy, passive range of motion,  balance training, functional mobility training, moist heat, cryotherapy, patient/family education, cognitive remediation/compensation, energy conservation, coping strategies training, and DME and/or AE instructions  RECOMMENDED OTHER SERVICES: N/A  CONSULTED AND AGREED WITH PLAN OF CARE: Patient and family member/caregiver  PLAN FOR NEXT SESSION: See above   Dessie Coma, M.S. OTR/L  11/13/21, 12:30 PM  ascom [phone]    Leonides Cave, OT 11/13/2021, 12:30 PM

## 2021-11-13 NOTE — Therapy (Signed)
OUTPATIENT PHYSICAL THERAPY NEURO/RECERTIFICATION   Patient Name: Joseph Arroyo MRN: [id number] DOB:23-Jul-1940, 81 y.o., male Today's Date: 11/14/2021   PCP: Olin Hauser, DO REFERRING PROVIDER: Lavena Bullion, NP   PT End of Session - 11/13/21 1215     Visit Number 16    Number of Visits 34    Date for PT Re-Evaluation 02/05/22    Authorization Type [employer]    Authorization Time Period Initial Cert 4/62/8638- 1/77/1165; Recert 7/90/3833-38/04/2917    Progress Note Due on Visit 20    PT Start Time 1110    PT Stop Time 1145    PT Time Calculation (min) 35 min    Equipment Utilized During Treatment Gait belt    Activity Tolerance Patient tolerated treatment well    Behavior During Therapy [facility] for tasks assessed/performed                      Past Medical History:  Diagnosis Date   Allergic rhinitis due to pollen 11/21/2007   Brachial neuritis or radiculitis NOS    Cervicalgia    Concussion    age 33 - s/p accident   Costal chondritis    Depression    Essential and other specified forms of tremor    GERD (gastroesophageal reflux disease)    in past   Lumbago    Occlusion and stenosis of carotid artery without mention of cerebral infarction    Seizures [facility])    age 21 - after concussion   Past Surgical History:  Procedure Laterality Date   CATARACT EXTRACTION Right 07/18/14   CATARACT EXTRACTION W/PHACO Left 08/01/2014   Procedure: CATARACT EXTRACTION PHACO AND INTRAOCULAR LENS PLACEMENT ([place]);  Surgeon: Leandrew Koyanagi, MD;  Location: [place];  Service: Ophthalmology;  Laterality: Left;  IVA TOPICAL   COLONOSCOPY  2008   OTHER SURGICAL HISTORY  2002   ear surgery   STAPEDES SURGERY Right 2002   Patient Active Problem List   Diagnosis Date Noted   Dysphagia, pharyngeal phase 09/17/2021   Cervicalgia 08/05/2021   Spondylosis without myelopathy or radiculopathy, lumbosacral region 06/24/2021   Lumbosacral facet  hypertrophy 06/03/2021   Lumbar central spinal stenosis, w/o neurogenic claudication (L4-5) 06/03/2021   Lumbar lateral recess stenosis (Bilateral: L2-3, L4-5) (Right: L3-4) 06/03/2021   Lumbosacral foraminal stenosis (Bilateral: L2-3, L3-4) (Left: L5-S1) 06/03/2021   Lumbar nerve root impingement (Right: L3 at L2-3 & L3-4) 06/03/2021   Ligamentum flavum hypertrophy (L3-4, L4-5) 06/03/2021   Abnormal MRI, lumbar spine (04/23/2021) 04/24/2021   Long term prescription benzodiazepine use (alprazolam) (Xanax) 03/24/2021   Grade 1 Retrolisthesis of L2/L3 (5 mm) and L3/L4 (3 mm) 03/24/2021   Levoscoliosis of lumbar spine (L3-4 apex) 03/24/2021   DDD (degenerative disc disease), lumbosacral 03/24/2021   Lumbosacral facet arthropathy (Left: L3-4, L4-5, and L5-S1) 03/24/2021   Tricompartment osteoarthritis of knee (Left) 03/24/2021   Baker cyst (Left) 03/24/2021   Chronic low back pain (1ry area of Pain) (Bilateral) (R>L) w/o sciatica 03/24/2021   Lumbar facet syndrome (Bilateral) 03/24/2021   Chronic lower extremity pain (2ry area of Pain) (Bilateral) (L>R) 03/24/2021   Lumbosacral radiculitis/sensory radiculopathy at L2 (Bilateral) 03/24/2021   Lumbosacral radiculitis/sensory radiculopathy at L3 (Bilateral) 03/24/2021   Chronic pain syndrome 03/23/2021   Pharmacologic therapy 03/23/2021   Disorder of skeletal system 03/23/2021   Problems influencing health status 03/23/2021   PAD (peripheral artery disease) ([place]) 10/01/2020   GAD (generalized anxiety disorder) 01/13/2018   MDD (major depressive disorder)  32/35/5732   Umbilical hernia 20/25/4270   Chronic knee pain (Left) 08/11/2017   Derangement of medial meniscus, posterior horn (Left) 08/11/2017   Vitamin D insufficiency 03/05/2016   BPH without obstruction/lower urinary tract symptoms 07/08/2015   Chronic fatigue 10/23/2014   Parkinson's disease ([place]) 09/20/2014   Major depression, recurrent, full remission ([place]) 07/26/2013   Unsteady  gait 07/26/2013   Orthostatic hypotension 07/26/2013   Depression 07/26/2013   Anxiety 06/23/2013   Chronic anxiety 06/23/2013   Tremor 05/18/2013   Bilateral carotid artery stenosis 08/30/2012    ONSET DATE: 08/11/2021  REFERRING DIAG: Parkinsons Disease  THERAPY DIAG:  Muscle weakness (generalized)  Other lack of coordination  Atypical parkinsonism (HCC)  Unsteadiness on feet  Difficulty in walking, not elsewhere classified  Abnormality of gait and mobility  Other abnormalities of gait and mobility  Rationale for Evaluation and Treatment Rehabilitation  SUBJECTIVE:                                                                                                                                                                                              SUBJECTIVE STATEMENT:   Patient states doing well and has been working out some at gym as well. He denies any falls since last session. Reports using mostly walker.   Pt accompanied by: significant other  PERTINENT HISTORY: Per chart history provided by Dr. Consuela Mimes- Mr. Conrad has Parkinson's disease ([place]); Chronic knee pain (Left); Derangement of medial meniscus, posterior horn (Left); PAD (peripheral artery disease) ([place]); Chronic pain syndrome; Grade 1 Retrolisthesis of L2/L3 (5 mm) and L3/L4 (3 mm); Levoscoliosis of lumbar spine (L3-4 apex); DDD (degenerative disc disease), lumbosacral; Lumbosacral facet arthropathy (Left: L3-4, L4-5, and L5-S1); Tricompartment osteoarthritis of knee (Left); Baker cyst (Left); Chronic low back pain (1ry area of Pain) (Bilateral) (R>L) w/o sciatica; Lumbar facet syndrome (Bilateral); Chronic lower extremity pain (2ry area of Pain) (Bilateral) (L>R); Lumbosacral radiculitis/sensory radiculopathy at L2 (Bilateral); Lumbosacral radiculitis/sensory radiculopathy at L3 (Bilateral); Abnormal MRI, lumbar spine (04/23/2021); Lumbosacral facet hypertrophy; Lumbar central spinal stenosis, w/o  neurogenic claudication (L4-5); Lumbar lateral recess stenosis (Bilateral: L2-3, L4-5) (Right: L3-4); Lumbosacral foraminal stenosis (Bilateral: L2-3, L3-4) (Left: L5-S1); Lumbar nerve root impingement (Right: L3 at L2-3 & L3-4); Ligamentum flavum hypertrophy (L3-4, L4-5); Spondylosis without myelopathy or radiculopathy, lumbosacral region; and Cervicalgia on their pertinent problem list   Patient has appointment for Epidural to low back next Tues 08/26/2021  PAIN:  Are you having pain? Yes: NPRS scale: 4/10 Pain location: low back Pain description: ache/soreness Aggravating factors: prolonged standing, transfers, walking Relieving factors: rest and medications  PRECAUTIONS: Fall  WEIGHT BEARING  RESTRICTIONS No  FALLS: Has patient fallen in last 6 months? Yes. Number of falls 2  LIVING ENVIRONMENT: Lives with: lives with their spouse Lives in: House/apartment Stairs: 4 Has following equipment at home: Walker - 2 wheeled  PLOF: Needs assistance with ADLs- intermittent  PATIENT GOALS  to walk better and be as active as possible. Also to get in/out of cars better.   OBJECTIVE: (objective measures completed at initial evaluation unless otherwise dated)   DIAGNOSTIC FINDINGS: IMPRESSION: 1. Severe lumbar levoscoliosis with widespread advanced disc and facet degeneration. 2. Mild spinal stenosis at L4-5. 3. Moderate neural foraminal stenosis on the right at L3-4 and on the left at L5-S1. 4. Moderate right lateral recess stenosis at L2-3.      LOWER EXTREMITY MMT:    MMT (evaluation)  Right Eval Left Eval  Hip flexion 4 4  Hip extension 4 4  Hip abduction 4 4  Hip adduction 4 4  Hip internal rotation 4 4  Hip external rotation 4 4  Knee flexion 4 4  Knee extension 4 4  Ankle dorsiflexion 4 4  Ankle plantarflexion 4 4  Ankle inversion 4 4  Ankle eversion 4 4  (Blank rows = not tested)     FUNCTIONAL TESTS (EVALUATION):  5 times sit to stand: 14 sec without UE  support Timed up and go (TUG): 15.67 sec with RW 10 meter walk test: 10.67 sec = 0.94 m/s using RW Berg Balance Scale: 48/56- see flowsheet for details  PATIENT SURVEYS:  FOTO : 49 8/24: 47  TODAY'S TREATMENT: 11/13/2021   *Reassessed goals for recert visit- Did not attempt Minibest due to time constraints- will need to be reassessed next visit. Please refer to goal section for details.     PATIENT EDUCATION: Education details: Pt educated throughout session about proper posture and technique with exercises. Improved exercise technique, movement at target joints, use of target muscles after min to mod verbal, visual, tactile cues.  Person educated: Patient Education method: Explanation, Demonstration, Tactile cues, and Verbal cues Education comprehension: verbalized understanding, returned demonstration, verbal cues required, tactile cues required, and needs further education   HOME EXERCISE PROGRAM: No updates on this date, pt to continue HEP as previously given: PWR! Basic 4 seated x 10 ea, handout provided: addended to not perform seated PRW! Rock exercise and added the below to program on 09/25/21  Access Code: [email or web address] URL: [email or web address] Date: 09/25/2021 Prepared by: Rivka Barbara  Exercises - Standing Romberg to 3/4 Tandem Stance  - 1 x daily - 7 x weekly - 2 sets - 30 seocnd hold - Standing March with Counter Support  - 1 x daily - 7 x weekly - 2 sets - 10 reps - Modified Single Leg Balance on Step  - 1 x daily - 7 x weekly - 2 sets - 30 second hold  Access Code: [email or web address] URL: [email or web address] Date: 10/21/2021 Prepared by: Janna Arch  Exercises - Seated Abdominal Press into [employer]  - 1 x daily - 7 x weekly - 2 sets - 10 reps - 5 hold - Seated Flexion Stretch with Swiss Ball  - 1 x daily - 7 x weekly - 2 sets - 10 reps - 5 hold  GOALS: Goals reviewed with patient? Yes  SHORT TERM GOALS: Target date: 10/02/2021  Pt  will be independent with initial HEP in order to improve strength and balance in order to decrease fall risk and improve function at home and work.  Baseline: 08/21/2021-No formal  HEP in place Goal status: MET    LONG TERM GOALS: Target date: 02/05/2022  Pt will improve BERG by at least 3 points in order to demonstrate clinically significant improvement in balance.    Baseline: 08/21/2021= 48/56 8/24:47/56; 11/13/2021= 47/56 Goal status: ongoing  2.  Pt will be independent with final HEP in order to improve strength and balance in order to decrease fall risk and improve function at home and work.  Baseline: No HEP in place, 8/24: completing current HEP but still progressing; 11/13/2021- Patient continues to verbalize and demonstrate understanding of HEP for stretching/strengthening and mobility but still progressing and altering program as appropriate Goal status: IN PROGRESS  3.  Pt will decrease 5TSTS by at least 4 seconds in order to demonstrate clinically significant improvement in LE strength. Baseline: 08/21/2021= 19.01 sec without UE support 8/24:10.56 sec Goal status: MET  4.  Pt will decrease TUG to below 14 seconds/decrease in order to demonstrate decreased fall risk. Baseline:  08/21/2021=15.67 with RW 8/24: 12.41 Goal status: MET  5.  Pt will improve FOTO to target score of 56 to display perceived improvements in ability to complete ADL's.  Baseline: 08/21/2021=49 8/24: 45.6; 11/13/2021=46 Goal status: IN PROGRESS  6.  Pt will increase 10MWT by at least 0.15 m/s in order to demonstrate clinically significant improvement in community ambulation.   Baseline: 08/22/2021=0.94 m/s 8/24:1.77ms Goal status: MET  7 Pt will improve miniBESTest by at least 4 points to demonstrate clinically significant improvement in reduced falls with parkinsonian related deficits.  Baseline: 19/28; 11/13/2021- Unable to test due to time constraint- will test next session and update goal.  Goal Status:  ONGOING  8. Pt will increase 6MWT by at least [id number] order to demonstrate clinically significant improvement in cardiopulmonary endurance and community ambulation  Baseline: 11/13/2021= 650 feet with walker Goal Status: NEW  ASSESSMENT:  CLINICAL IMPRESSION:  Treatment limited today secondary to scheduling issue. Patient presented with good motivation to recheck goals. He scored the same as last progress note however unable to assess Minibest due to time. Patient expressed being limited as far as endurance as well so tested and scored well below normative data and added a 6 min walk test goal to address his impaired endurance along with continuing to benefit from skilled physical therapy intervention to address impaired balance to  improve QOL, and attain therapy goals.      OBJECTIVE IMPAIRMENTS Abnormal gait, decreased activity tolerance, decreased balance, decreased coordination, decreased endurance, decreased knowledge of use of DME, decreased mobility, difficulty walking, decreased ROM, decreased strength, hypomobility, and impaired flexibility.   ACTIVITY LIMITATIONS carrying, lifting, bending, standing, squatting, sleeping, stairs, transfers, continence, bathing, toileting, and dressing  PARTICIPATION LIMITATIONS: cleaning, laundry, medication management, driving, shopping, community activity, and yard work  PERSONAL FACTORS Age are also affecting patient's functional outcome.   REHAB POTENTIAL: Good  CLINICAL DECISION MAKING: Stable/uncomplicated  EVALUATION COMPLEXITY: Low  PLAN: PT FREQUENCY: 2x/week  PT DURATION: 12 weeks  PLANNED INTERVENTIONS: Therapeutic exercises, Therapeutic activity, Neuromuscular re-education, Balance training, Gait training, Patient/Family education, Joint mobilization, Stair training, Vestibular training, Canalith repositioning, DME instructions, Dry Needling, Electrical stimulation, Spinal manipulation, and Spinal mobilization  PLAN FOR  NEXT SESSION: continue balance training, monitor hypotension and orthostatics, ROM for flexibility, continue plan  JefKathlee Nationsstbrooks PT  11/14/21, 11:08 AM

## 2021-11-18 ENCOUNTER — Ambulatory Visit: Payer: Medicare HMO | Admitting: Occupational Therapy

## 2021-11-18 ENCOUNTER — Encounter: Payer: Medicare HMO | Admitting: Speech Pathology

## 2021-11-18 ENCOUNTER — Encounter: Payer: Self-pay | Admitting: Occupational Therapy

## 2021-11-18 ENCOUNTER — Ambulatory Visit: Payer: Medicare HMO

## 2021-11-18 DIAGNOSIS — R262 Difficulty in walking, not elsewhere classified: Secondary | ICD-10-CM | POA: Diagnosis not present

## 2021-11-18 DIAGNOSIS — M6281 Muscle weakness (generalized): Secondary | ICD-10-CM

## 2021-11-18 DIAGNOSIS — R269 Unspecified abnormalities of gait and mobility: Secondary | ICD-10-CM | POA: Diagnosis not present

## 2021-11-18 DIAGNOSIS — R2681 Unsteadiness on feet: Secondary | ICD-10-CM | POA: Diagnosis not present

## 2021-11-18 DIAGNOSIS — R278 Other lack of coordination: Secondary | ICD-10-CM

## 2021-11-18 DIAGNOSIS — G20 Parkinson's disease: Secondary | ICD-10-CM

## 2021-11-18 DIAGNOSIS — R2689 Other abnormalities of gait and mobility: Secondary | ICD-10-CM | POA: Diagnosis not present

## 2021-11-18 NOTE — Therapy (Addendum)
OUTPATIENT OCCUPATIONAL THERAPY NEURO RECERTIFICATION NOTE  Patient Name: Joseph Arroyo MRN: [id number] DOB:05/26/1940, 81 y.o., male Today's Date: 11/18/2021  PCP: Dr. Nobie Putnam REFERRING PROVIDER: Dr. Lavena Bullion     OT End of Session - 11/18/21 1335     Visit Number 18    Number of Visits 20    Date for OT Re-Evaluation 12/09/21    Authorization Time Period Reporting period beginning 10/23/21    OT Start Time 1340    OT Stop Time 1425    OT Time Calculation (min) 45 min    Equipment Utilized During Treatment RW    Activity Tolerance Patient tolerated treatment well    Behavior During Therapy [facility] for tasks assessed/performed             Past Medical History:  Diagnosis Date   Allergic rhinitis due to pollen 11/21/2007   Brachial neuritis or radiculitis NOS    Cervicalgia    Concussion    age 26 - s/p accident   Costal chondritis    Depression    Essential and other specified forms of tremor    GERD (gastroesophageal reflux disease)    in past   Lumbago    Occlusion and stenosis of carotid artery without mention of cerebral infarction    Seizures [facility])    age 81 - after concussion   Past Surgical History:  Procedure Laterality Date   CATARACT EXTRACTION Right 07/18/14   CATARACT EXTRACTION W/PHACO Left 08/01/2014   Procedure: CATARACT EXTRACTION PHACO AND INTRAOCULAR LENS PLACEMENT ([place]);  Surgeon: Leandrew Koyanagi, MD;  Location: [place];  Service: Ophthalmology;  Laterality: Left;  IVA TOPICAL   COLONOSCOPY  2008   OTHER SURGICAL HISTORY  2002   ear surgery   STAPEDES SURGERY Right 2002   Patient Active Problem List   Diagnosis Date Noted   Dysphagia, pharyngeal phase 09/17/2021   Cervicalgia 08/05/2021   Spondylosis without myelopathy or radiculopathy, lumbosacral region 06/24/2021   Lumbosacral facet hypertrophy 06/03/2021   Lumbar central spinal stenosis, w/o neurogenic claudication (L4-5) 06/03/2021   Lumbar  lateral recess stenosis (Bilateral: L2-3, L4-5) (Right: L3-4) 06/03/2021   Lumbosacral foraminal stenosis (Bilateral: L2-3, L3-4) (Left: L5-S1) 06/03/2021   Lumbar nerve root impingement (Right: L3 at L2-3 & L3-4) 06/03/2021   Ligamentum flavum hypertrophy (L3-4, L4-5) 06/03/2021   Abnormal MRI, lumbar spine (04/23/2021) 04/24/2021   Long term prescription benzodiazepine use (alprazolam) (Xanax) 03/24/2021   Grade 1 Retrolisthesis of L2/L3 (5 mm) and L3/L4 (3 mm) 03/24/2021   Levoscoliosis of lumbar spine (L3-4 apex) 03/24/2021   DDD (degenerative disc disease), lumbosacral 03/24/2021   Lumbosacral facet arthropathy (Left: L3-4, L4-5, and L5-S1) 03/24/2021   Tricompartment osteoarthritis of knee (Left) 03/24/2021   Baker cyst (Left) 03/24/2021   Chronic low back pain (1ry area of Pain) (Bilateral) (R>L) w/o sciatica 03/24/2021   Lumbar facet syndrome (Bilateral) 03/24/2021   Chronic lower extremity pain (2ry area of Pain) (Bilateral) (L>R) 03/24/2021   Lumbosacral radiculitis/sensory radiculopathy at L2 (Bilateral) 03/24/2021   Lumbosacral radiculitis/sensory radiculopathy at L3 (Bilateral) 03/24/2021   Chronic pain syndrome 03/23/2021   Pharmacologic therapy 03/23/2021   Disorder of skeletal system 03/23/2021   Problems influencing health status 03/23/2021   PAD (peripheral artery disease) ([place]) 10/01/2020   GAD (generalized anxiety disorder) 01/13/2018   MDD (major depressive disorder) 62/94/7654   Umbilical hernia 65/05/5463   Chronic knee pain (Left) 08/11/2017   Derangement of medial meniscus, posterior horn (Left) 08/11/2017   Vitamin D  insufficiency 03/05/2016   BPH without obstruction/lower urinary tract symptoms 07/08/2015   Chronic fatigue 10/23/2014   Parkinson's disease ([place]) 09/20/2014   Major depression, recurrent, full remission ([place]) 07/26/2013   Unsteady gait 07/26/2013   Orthostatic hypotension 07/26/2013   Depression 07/26/2013   Anxiety 06/23/2013   Chronic  anxiety 06/23/2013   Tremor 05/18/2013   Bilateral carotid artery stenosis 08/30/2012    ONSET DATE: 08/11/21 date of referral.  PD symptoms since 2015.  REFERRING DIAG: Parkinson's Disease  THERAPY DIAG:  Other lack of coordination  Atypical parkinsonism ([place])  Muscle weakness (generalized)  Rationale for Evaluation and Treatment Rehabilitation  SUBJECTIVE:   SUBJECTIVE STATEMENT: Pt reports still waiting for his hip kit, reports sitting in a chair to get dressed has been very helpful.  PERTINENT HISTORY: Per chart, pt with hx of Parkinson's disease ([place]); Chronic knee pain (Left); Derangement of medial meniscus, posterior horn (Left); PAD (peripheral artery disease) ([place]); Chronic pain syndrome; DDD (degenerative disc disease) lumbosacral; osteoarthritis of knee (Left); Chronic low back pain, Lumbar facet syndrome (Bilateral); Chronic lower extremity pain. PRECAUTIONS: Fall  WEIGHT BEARING RESTRICTIONS No  PAIN:  Are you having pain? Yes: NPRS scale:   4/10 Pain location: low back Pain description: achy Aggravating factors: N/A, constant Relieving factors: rest, repositioning , heat  PATIENT GOALS : Increase efficiency with daily tasks, increase FMC skills   OBJECTIVE:  HAND FUNCTION: Grip strength: Right: 46 lbs; Left: 54 lbs, Lateral pinch: Right: 12 lbs, Left: 9 lbs, and 3 point pinch: Right: 11 lbs, Left: 9 lbs   COORDINATION: 9 Hole Peg test: Right: 30.3 sec; Left: 34.3 sec  TODAY'S TREATMENT:  Self Care: Provided education and demonstration of dressing stick demo. Educated on hemi dressing techniques. Initially required MIN A don Large button down shirt with dressing stick, repeated x2 trials with MIN cues. Greatly improved with use of mirror for 2 trials donning long sleeved jacket, no cues. Reviewed use of reacher, long handled sponge, and sock aid. Plan for pt to trial at home and bring long sleeved button down / jacket next session.    PATIENT  EDUCATION: Education details: dressing AE Person educated: Patient and Spouse Education method: Explanation and Verbal cues Education comprehension: verbalized understanding, returned demo   HOME EXERCISE PROGRAM:  BUE strengthening with yellow/red theraband   GOALS: Goals reviewed with patient? Yes  SHORT TERM GOALS: Target date: 09/30/21  Pt will be indep to perform HEP for increasing strength and coordination in B hands.  Baseline: HEP not yet initiated; 10th: pt performs yellow theraband and theraputty exercises regularly and independently Goal status: achieved  2.  Pt will be indep to verbalize 3 fall prevention strategies to implement during ADLs.  Baseline: Not yet initiated; 10th: Pt is able to verbalize 3+ strategies for fall prevention (ie consistent use of walker, slow positional changes, removal of throw rugs, keep pathways clear)  Goal status: achieved   3 LONG TERM GOALS: 12/09/21  Pt will increase FOTO score by 3 or more points to indicate increased perceived functional performance with daily tasks.  Baseline: FOTO 60; 10th: will complete next session (therapist's computer unavailable); 11/11/21: 49 Goal status: ongoing  2.  Pt will be able to efficiently send short text messages to family/friends with modified techniques as needed (ie: use of stylus, voice to text) Baseline: Spouse sends texts for pt; 10th: pt started practicing with use of stylus and will continue to assess if efficiency is better with or without; pt requires reminders  to use predictive texting  Goal status: ongoing  3.  Pt will fill out a check for bill paying with 90% legibility with adapted pen as needed. Baseline: Pt reports inconsistent legibility; 100% legibility with compensatory strategies: Forearm support on table top, postural corrections, wide grip pen Goal status: achieved  4.  Pt will increase efficiency with dressing tasks, donning clothes in 15 min or less. Baseline: Spouse reports  it can take pt 20-30 min to dress in the morning; 11/11/21: Pt is using reacher and long shoe horn with min vc; hip kit will arrive this week and pt will benefit from training with dressing stick to don coat and button hook  Goal status: ongoing    ASSESSMENT:  CLINICAL IMPRESSION: Pt brought in hip kit and reviewed use of dressing stick, multiple trials for short sleeve t shirt and long sleeve jacket. Improved with use of mirror. Pt will benefit from additional sessions for ADL/AE training with new equipment, and work towards remaining goals in poc to maximize safety and indep with daily tasks.    PERFORMANCE DEFICITS in functional skills including ADLs, IADLs, coordination, dexterity, ROM, strength, pain, flexibility, FMC, and GMC, cognitive skills including safety awareness.  IMPAIRMENTS are limiting patient from ADLs, IADLs, and leisure.   COMORBIDITIES has co-morbidities such as scoliosis, chronic back pain and leg pain  that affects occupational performance. Patient will benefit from skilled OT to address above impairments and improve overall function.  MODIFICATION OR ASSISTANCE TO COMPLETE EVALUATION: No modification of tasks or assist necessary to complete an evaluation.  OT OCCUPATIONAL PROFILE AND HISTORY: Problem focused assessment: Including review of records relating to presenting problem.  CLINICAL DECISION MAKING: Moderate - several treatment options, min-mod task modification necessary  REHAB POTENTIAL: Good  EVALUATION COMPLEXITY: Moderate    PLAN: OT FREQUENCY: 2x/week for 2 weeks (4 additional sessions)  OT DURATION: 2 weeks (re-eval date in 4 weeks in case of missed or rescheduled sessions)  PLANNED INTERVENTIONS: self care/ADL training, therapeutic exercise, therapeutic activity, neuromuscular re-education, manual therapy, passive range of motion, balance training, functional mobility training, moist heat, cryotherapy, patient/family education, cognitive  remediation/compensation, energy conservation, coping strategies training, and DME and/or AE instructions  RECOMMENDED OTHER SERVICES: N/A  CONSULTED AND AGREED WITH PLAN OF CARE: Patient and family member/caregiver  PLAN FOR NEXT SESSION: See above   Dessie Coma, M.S. OTR/L  11/18/21, 1:36 PM  ascom [phone]    Leonides Cave, OT 11/18/2021, 1:36 PM

## 2021-11-18 NOTE — Therapy (Signed)
OUTPATIENT PHYSICAL THERAPY NEURO TREATMENT   Patient Name: Joseph Arroyo MRN: [id number] DOB:1940/11/16, 81 y.o., male Today's Date: 11/19/2021   PCP: Olin Hauser, DO REFERRING PROVIDER: Lavena Bullion, NP   PT End of Session - 11/18/21 1422     Visit Number 17    Number of Visits 66    Date for PT Re-Evaluation 02/05/22    Authorization Type [employer]    Authorization Time Period Initial Cert 2/95/6213- 0/86/5784; Recert 6/96/2952-84/03/3242    Progress Note Due on Visit 20    PT Start Time 1430    PT Stop Time 1514    PT Time Calculation (min) 44 min    Equipment Utilized During Treatment Gait belt    Activity Tolerance Patient tolerated treatment well    Behavior During Therapy [facility] for tasks assessed/performed                       Past Medical History:  Diagnosis Date   Allergic rhinitis due to pollen 11/21/2007   Brachial neuritis or radiculitis NOS    Cervicalgia    Concussion    age 47 - s/p accident   Costal chondritis    Depression    Essential and other specified forms of tremor    GERD (gastroesophageal reflux disease)    in past   Lumbago    Occlusion and stenosis of carotid artery without mention of cerebral infarction    Seizures [facility])    age 52 - after concussion   Past Surgical History:  Procedure Laterality Date   CATARACT EXTRACTION Right 07/18/14   CATARACT EXTRACTION W/PHACO Left 08/01/2014   Procedure: CATARACT EXTRACTION PHACO AND INTRAOCULAR LENS PLACEMENT ([place]);  Surgeon: Leandrew Koyanagi, MD;  Location: [facility];  Service: Ophthalmology;  Laterality: Left;  IVA TOPICAL   COLONOSCOPY  2008   OTHER SURGICAL HISTORY  2002   ear surgery   STAPEDES SURGERY Right 2002   Patient Active Problem List   Diagnosis Date Noted   Dysphagia, pharyngeal phase 09/17/2021   Cervicalgia 08/05/2021   Spondylosis without myelopathy or radiculopathy, lumbosacral region 06/24/2021   Lumbosacral facet  hypertrophy 06/03/2021   Lumbar central spinal stenosis, w/o neurogenic claudication (L4-5) 06/03/2021   Lumbar lateral recess stenosis (Bilateral: L2-3, L4-5) (Right: L3-4) 06/03/2021   Lumbosacral foraminal stenosis (Bilateral: L2-3, L3-4) (Left: L5-S1) 06/03/2021   Lumbar nerve root impingement (Right: L3 at L2-3 & L3-4) 06/03/2021   Ligamentum flavum hypertrophy (L3-4, L4-5) 06/03/2021   Abnormal MRI, lumbar spine (04/23/2021) 04/24/2021   Long term prescription benzodiazepine use (alprazolam) (Xanax) 03/24/2021   Grade 1 Retrolisthesis of L2/L3 (5 mm) and L3/L4 (3 mm) 03/24/2021   Levoscoliosis of lumbar spine (L3-4 apex) 03/24/2021   DDD (degenerative disc disease), lumbosacral 03/24/2021   Lumbosacral facet arthropathy (Left: L3-4, L4-5, and L5-S1) 03/24/2021   Tricompartment osteoarthritis of knee (Left) 03/24/2021   Baker cyst (Left) 03/24/2021   Chronic low back pain (1ry area of Pain) (Bilateral) (R>L) w/o sciatica 03/24/2021   Lumbar facet syndrome (Bilateral) 03/24/2021   Chronic lower extremity pain (2ry area of Pain) (Bilateral) (L>R) 03/24/2021   Lumbosacral radiculitis/sensory radiculopathy at L2 (Bilateral) 03/24/2021   Lumbosacral radiculitis/sensory radiculopathy at L3 (Bilateral) 03/24/2021   Chronic pain syndrome 03/23/2021   Pharmacologic therapy 03/23/2021   Disorder of skeletal system 03/23/2021   Problems influencing health status 03/23/2021   PAD (peripheral artery disease) ([place]) 10/01/2020   GAD (generalized anxiety disorder) 01/13/2018   MDD (major  depressive disorder) 66/59/9357   Umbilical hernia 01/77/9390   Chronic knee pain (Left) 08/11/2017   Derangement of medial meniscus, posterior horn (Left) 08/11/2017   Vitamin D insufficiency 03/05/2016   BPH without obstruction/lower urinary tract symptoms 07/08/2015   Chronic fatigue 10/23/2014   Parkinson's disease ([place]) 09/20/2014   Major depression, recurrent, full remission ([place]) 07/26/2013   Unsteady  gait 07/26/2013   Orthostatic hypotension 07/26/2013   Depression 07/26/2013   Anxiety 06/23/2013   Chronic anxiety 06/23/2013   Tremor 05/18/2013   Bilateral carotid artery stenosis 08/30/2012    ONSET DATE: 08/11/2021  REFERRING DIAG: Parkinsons Disease  THERAPY DIAG:  Other lack of coordination  Atypical parkinsonism (HCC)  Muscle weakness (generalized)  Unsteadiness on feet  Rationale for Evaluation and Treatment Rehabilitation  SUBJECTIVE:                                                                                                                                                                                              SUBJECTIVE STATEMENT:   Patient presents with wife, reports he is very fatigued from OT.    Pt accompanied by: significant other  PERTINENT HISTORY: Per chart history provided by Dr. Consuela Mimes- [name] has Parkinson's disease ([place]); Chronic knee pain (Left); Derangement of medial meniscus, posterior horn (Left); PAD (peripheral artery disease) ([place]); Chronic pain syndrome; Grade 1 Retrolisthesis of L2/L3 (5 mm) and L3/L4 (3 mm); Levoscoliosis of lumbar spine (L3-4 apex); DDD (degenerative disc disease), lumbosacral; Lumbosacral facet arthropathy (Left: L3-4, L4-5, and L5-S1); Tricompartment osteoarthritis of knee (Left); Baker cyst (Left); Chronic low back pain (1ry area of Pain) (Bilateral) (R>L) w/o sciatica; Lumbar facet syndrome (Bilateral); Chronic lower extremity pain (2ry area of Pain) (Bilateral) (L>R); Lumbosacral radiculitis/sensory radiculopathy at L2 (Bilateral); Lumbosacral radiculitis/sensory radiculopathy at L3 (Bilateral); Abnormal MRI, lumbar spine (04/23/2021); Lumbosacral facet hypertrophy; Lumbar central spinal stenosis, w/o neurogenic claudication (L4-5); Lumbar lateral recess stenosis (Bilateral: L2-3, L4-5) (Right: L3-4); Lumbosacral foraminal stenosis (Bilateral: L2-3, L3-4) (Left: L5-S1); Lumbar nerve root impingement (Right: L3  at L2-3 & L3-4); Ligamentum flavum hypertrophy (L3-4, L4-5); Spondylosis without myelopathy or radiculopathy, lumbosacral region; and Cervicalgia on their pertinent problem list   Patient has appointment for Epidural to low back next Tues 08/26/2021  PAIN:  Are you having pain? Yes: NPRS scale: 4/10 Pain location: low back Pain description: ache/soreness Aggravating factors: prolonged standing, transfers, walking Relieving factors: rest and medications  PRECAUTIONS: Fall  WEIGHT BEARING RESTRICTIONS No  FALLS: Has patient fallen in last 6 months? Yes. Number of falls 2  LIVING ENVIRONMENT: Lives with: lives with their spouse Lives in: House/apartment Stairs: 4 Has  following equipment at home: Gilford Rile - 2 wheeled  PLOF: Needs assistance with ADLs- intermittent  PATIENT GOALS  to walk better and be as active as possible. Also to get in/out of cars better.   OBJECTIVE: (objective measures completed at initial evaluation unless otherwise dated)   DIAGNOSTIC FINDINGS: IMPRESSION: 1. Severe lumbar levoscoliosis with widespread advanced disc and facet degeneration. 2. Mild spinal stenosis at L4-5. 3. Moderate neural foraminal stenosis on the right at L3-4 and on the left at L5-S1. 4. Moderate right lateral recess stenosis at L2-3.      LOWER EXTREMITY MMT:    MMT (evaluation)  Right Eval Left Eval  Hip flexion 4 4  Hip extension 4 4  Hip abduction 4 4  Hip adduction 4 4  Hip internal rotation 4 4  Hip external rotation 4 4  Knee flexion 4 4  Knee extension 4 4  Ankle dorsiflexion 4 4  Ankle plantarflexion 4 4  Ankle inversion 4 4  Ankle eversion 4 4  (Blank rows = not tested)     FUNCTIONAL TESTS (EVALUATION):  5 times sit to stand: 14 sec without UE support Timed up and go (TUG): 15.67 sec with RW 10 meter walk test: 10.67 sec = 0.94 m/s using RW Berg Balance Scale: 48/56- see flowsheet for details  PATIENT SURVEYS:  FOTO : 49 8/24: 47  TODAY'S  TREATMENT:   MiniBest: 18/28   Seated: Hamstring isometric 10x 5 second holds each side  Lateral step over blue hedghog 10x each side  GTB hamstring curl 10x each side  TrA activation 10x 3 second holds   PATIENT EDUCATION: Education details: Pt educated throughout session about proper posture and technique with exercises. Improved exercise technique, movement at target joints, use of target muscles after min to mod verbal, visual, tactile cues.  Person educated: Patient Education method: Explanation, Demonstration, Tactile cues, and Verbal cues Education comprehension: verbalized understanding, returned demonstration, verbal cues required, tactile cues required, and needs further education   HOME EXERCISE PROGRAM: No updates on this date, pt to continue HEP as previously given: PWR! Basic 4 seated x 10 ea, handout provided: addended to not perform seated PRW! Rock exercise and added the below to program on 09/25/21  Access Code: [email or web address] URL: [email or web address] Date: 09/25/2021 Prepared by: Rivka Barbara  Exercises - Standing Romberg to 3/4 Tandem Stance  - 1 x daily - 7 x weekly - 2 sets - 30 seocnd hold - Standing March with Counter Support  - 1 x daily - 7 x weekly - 2 sets - 10 reps - Modified Single Leg Balance on Step  - 1 x daily - 7 x weekly - 2 sets - 30 second hold  Access Code: [email or web address] URL: [email or web address] Date: 10/21/2021 Prepared by: Janna Arch  Exercises - Seated Abdominal Press into [employer]  - 1 x daily - 7 x weekly - 2 sets - 10 reps - 5 hold - Seated Flexion Stretch with Swiss Ball  - 1 x daily - 7 x weekly - 2 sets - 10 reps - 5 hold  GOALS: Goals reviewed with patient? Yes  SHORT TERM GOALS: Target date: 10/02/2021  Pt will be independent with initial HEP in order to improve strength and balance in order to decrease fall risk and improve function at home and work.  Baseline: 08/21/2021-No formal HEP in  place Goal status: MET    LONG TERM GOALS: Target date: 02/05/2022  Pt will improve BERG by at least 3 points  in order to demonstrate clinically significant improvement in balance.    Baseline: 08/21/2021= 48/56 8/24:47/56; 11/13/2021= 47/56 Goal status: ongoing  2.  Pt will be independent with final HEP in order to improve strength and balance in order to decrease fall risk and improve function at home and work.  Baseline: No HEP in place, 8/24: completing current HEP but still progressing; 11/13/2021- Patient continues to verbalize and demonstrate understanding of HEP for stretching/strengthening and mobility but still progressing and altering program as appropriate Goal status: IN PROGRESS  3.  Pt will decrease 5TSTS by at least 4 seconds in order to demonstrate clinically significant improvement in LE strength. Baseline: 08/21/2021= 19.01 sec without UE support 8/24:10.56 sec Goal status: MET  4.  Pt will decrease TUG to below 14 seconds/decrease in order to demonstrate decreased fall risk. Baseline:  08/21/2021=15.67 with RW 8/24: 12.41 Goal status: MET  5.  Pt will improve FOTO to target score of 56 to display perceived improvements in ability to complete ADL's.  Baseline: 08/21/2021=49 8/24: 45.6; 11/13/2021=46 Goal status: IN PROGRESS  6.  Pt will increase 10MWT by at least 0.15 m/s in order to demonstrate clinically significant improvement in community ambulation.   Baseline: 08/22/2021=0.94 m/s 8/24:1.42ms Goal status: MET  7 Pt will improve miniBESTest by at least 4 points to demonstrate clinically significant improvement in reduced falls with parkinsonian related deficits.  Baseline: 19/28; 11/13/2021- Unable to test due to time constraint- will test next session and update goal. 9/19: 18/28  Goal Status: ONGOING  8. Pt will increase 6MWT by at least [id number] order to demonstrate clinically significant improvement in cardiopulmonary endurance and community ambulation   Baseline: 11/13/2021= 650 feet with walker Goal Status: NEW  ASSESSMENT:  CLINICAL IMPRESSION:  Patient performed Mini BEST test today. He does require increased seated rest breaks due to fatigue from previous OT session in combination with back pain. Utilization of heat made pain within tolerable ranges. Patient will continue to benefit from skilled physical therapy to improve stability, mobility, and strength for improved quality of life.       OBJECTIVE IMPAIRMENTS Abnormal gait, decreased activity tolerance, decreased balance, decreased coordination, decreased endurance, decreased knowledge of use of DME, decreased mobility, difficulty walking, decreased ROM, decreased strength, hypomobility, and impaired flexibility.   ACTIVITY LIMITATIONS carrying, lifting, bending, standing, squatting, sleeping, stairs, transfers, continence, bathing, toileting, and dressing  PARTICIPATION LIMITATIONS: cleaning, laundry, medication management, driving, shopping, community activity, and yard work  PERSONAL FACTORS Age are also affecting patient's functional outcome.   REHAB POTENTIAL: Good  CLINICAL DECISION MAKING: Stable/uncomplicated  EVALUATION COMPLEXITY: Low  PLAN: PT FREQUENCY: 2x/week  PT DURATION: 12 weeks  PLANNED INTERVENTIONS: Therapeutic exercises, Therapeutic activity, Neuromuscular re-education, Balance training, Gait training, Patient/Family education, Joint mobilization, Stair training, Vestibular training, Canalith repositioning, DME instructions, Dry Needling, Electrical stimulation, Spinal manipulation, and Spinal mobilization  PLAN FOR NEXT SESSION: continue balance training, monitor hypotension and orthostatics, ROM for flexibility, continue plan  MarJanna Arch  11/19/21, 8:23 AM

## 2021-11-19 NOTE — Therapy (Signed)
OUTPATIENT PHYSICAL THERAPY NEURO TREATMENT   Patient Name: Joseph Arroyo MRN: [id number] DOB:[birth year], 81 y.o., male Today's Date: 11/20/2021   PCP: Joseph Hauser, DO REFERRING PROVIDER: Lavena Bullion, NP   PT End of Session - 11/20/21 1015     Visit Number 18    Number of Visits 55    Date for PT Re-Evaluation 02/05/22    Authorization Type [employer]    Authorization Time Period Initial Cert 9/32/6712- 4/58/0998; Recert 3/38/2505-39/08/6732    Progress Note Due on Visit 20    PT Start Time 1015    PT Stop Time 1059    PT Time Calculation (min) 44 min    Equipment Utilized During Treatment Gait belt    Activity Tolerance Patient tolerated treatment well    Behavior During Therapy [facility] for tasks assessed/performed                        Past Medical History:  Diagnosis Date   Allergic rhinitis due to pollen 11/21/2007   Brachial neuritis or radiculitis NOS    Cervicalgia    Concussion    age 32 - s/p accident   Costal chondritis    Depression    Essential and other specified forms of tremor    GERD (gastroesophageal reflux disease)    in past   Lumbago    Occlusion and stenosis of carotid artery without mention of cerebral infarction    Seizures [facility])    age 32 - after concussion   Past Surgical History:  Procedure Laterality Date   CATARACT EXTRACTION Right 07/18/14   CATARACT EXTRACTION W/PHACO Left 08/01/2014   Procedure: CATARACT EXTRACTION PHACO AND INTRAOCULAR LENS PLACEMENT (Joseph Arroyo);  Surgeon: Joseph Koyanagi, MD;  Location: [facility];  Service: Ophthalmology;  Laterality: Left;  IVA TOPICAL   COLONOSCOPY  2008   OTHER SURGICAL HISTORY  2002   ear surgery   STAPEDES SURGERY Right 2002   Patient Active Problem List   Diagnosis Date Noted   Dysphagia, pharyngeal phase 09/17/2021   Cervicalgia 08/05/2021   Spondylosis without myelopathy or radiculopathy, lumbosacral region 06/24/2021   Lumbosacral facet  hypertrophy 06/03/2021   Lumbar central spinal stenosis, w/o neurogenic claudication (L4-5) 06/03/2021   Lumbar lateral recess stenosis (Bilateral: L2-3, L4-5) (Right: L3-4) 06/03/2021   Lumbosacral foraminal stenosis (Bilateral: L2-3, L3-4) (Left: L5-S1) 06/03/2021   Lumbar nerve root impingement (Right: L3 at L2-3 & L3-4) 06/03/2021   Ligamentum flavum hypertrophy (L3-4, L4-5) 06/03/2021   Abnormal MRI, lumbar spine (04/23/2021) 04/24/2021   Long term prescription benzodiazepine use (alprazolam) (Xanax) 03/24/2021   Grade 1 Retrolisthesis of L2/L3 (5 mm) and L3/L4 (3 mm) 03/24/2021   Levoscoliosis of lumbar spine (L3-4 apex) 03/24/2021   DDD (degenerative disc disease), lumbosacral 03/24/2021   Lumbosacral facet arthropathy (Left: L3-4, L4-5, and L5-S1) 03/24/2021   Tricompartment osteoarthritis of knee (Left) 03/24/2021   Baker cyst (Left) 03/24/2021   Chronic low back pain (1ry area of Pain) (Bilateral) (R>L) w/o sciatica 03/24/2021   Lumbar facet syndrome (Bilateral) 03/24/2021   Chronic lower extremity pain (2ry area of Pain) (Bilateral) (L>R) 03/24/2021   Lumbosacral radiculitis/sensory radiculopathy at L2 (Bilateral) 03/24/2021   Lumbosacral radiculitis/sensory radiculopathy at L3 (Bilateral) 03/24/2021   Chronic pain syndrome 03/23/2021   Pharmacologic therapy 03/23/2021   Disorder of skeletal system 03/23/2021   Problems influencing health status 03/23/2021   PAD (peripheral artery disease) (Joseph Arroyo) 10/01/2020   GAD (generalized anxiety disorder) 01/13/2018   MDD (  major depressive disorder) 00/45/9977   Umbilical hernia 41/42/3953   Chronic knee pain (Left) 08/11/2017   Derangement of medial meniscus, posterior horn (Left) 08/11/2017   Vitamin D insufficiency 03/05/2016   BPH without obstruction/lower urinary tract symptoms 07/08/2015   Chronic fatigue 10/23/2014   Parkinson's disease (Joseph Arroyo) 09/20/2014   Major depression, recurrent, full remission (Joseph Arroyo) 07/26/2013   Unsteady  gait 07/26/2013   Orthostatic hypotension 07/26/2013   Depression 07/26/2013   Anxiety 06/23/2013   Chronic anxiety 06/23/2013   Tremor 05/18/2013   Bilateral carotid artery stenosis 08/30/2012    ONSET DATE: 08/11/2021  REFERRING DIAG: Parkinsons Disease  THERAPY DIAG:  Other lack of coordination  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   Pt accompanied by: significant other  PERTINENT HISTORY: Per chart history provided by Dr. Consuela Arroyo- [name] has Parkinson's disease (Joseph Arroyo); Chronic knee pain (Left); Derangement of medial meniscus, posterior horn (Left); PAD (peripheral artery disease) (Joseph Arroyo); Chronic pain syndrome; Grade 1 Retrolisthesis of L2/L3 (5 mm) and L3/L4 (3 mm); Levoscoliosis of lumbar spine (L3-4 apex); DDD (degenerative disc disease), lumbosacral; Lumbosacral facet arthropathy (Left: L3-4, L4-5, and L5-S1); Tricompartment osteoarthritis of knee (Left); Baker cyst (Left); Chronic low back pain (1ry area of Pain) (Bilateral) (R>L) w/o sciatica; Lumbar facet syndrome (Bilateral); Chronic lower extremity pain (2ry area of Pain) (Bilateral) (L>R); Lumbosacral radiculitis/sensory radiculopathy at L2 (Bilateral); Lumbosacral radiculitis/sensory radiculopathy at L3 (Bilateral); Abnormal MRI, lumbar spine (04/23/2021); Lumbosacral facet hypertrophy; Lumbar central spinal stenosis, w/o neurogenic claudication (L4-5); Lumbar lateral recess stenosis (Bilateral: L2-3, L4-5) (Right: L3-4); Lumbosacral foraminal stenosis  (Bilateral: L2-3, L3-4) (Left: L5-S1); Lumbar nerve root impingement (Right: L3 at L2-3 & L3-4); Ligamentum flavum hypertrophy (L3-4, L4-5); Spondylosis without myelopathy or radiculopathy, lumbosacral region; and Cervicalgia on their pertinent problem list   Patient has appointment for Epidural to low back next Tues 08/26/2021  PAIN:  Are you having pain? Yes: NPRS scale: 3/10 Pain location: low back Pain description: ache/soreness Aggravating factors: prolonged standing, transfers, walking Relieving factors: rest and medications  PRECAUTIONS: Fall  WEIGHT BEARING RESTRICTIONS No  FALLS: Has patient fallen in last 6 months? Yes. Number of falls 2  LIVING ENVIRONMENT: Lives with: lives with their spouse Lives in: House/apartment Stairs: 4 Has following equipment at home: Walker - 2 wheeled  PLOF: Needs assistance with ADLs- intermittent  PATIENT GOALS  to walk better and be as active as possible. Also to get in/out of cars better.   OBJECTIVE: (objective measures completed at initial evaluation unless otherwise dated)   DIAGNOSTIC FINDINGS: IMPRESSION: 1. Severe lumbar levoscoliosis with widespread advanced disc and facet degeneration. 2. Mild spinal stenosis at L4-5. 3. Moderate neural foraminal stenosis on the right at L3-4 and on the left at L5-S1. 4. Moderate right lateral recess stenosis at L2-3.      LOWER EXTREMITY MMT:    MMT (evaluation)  Right Eval Left Eval  Hip flexion 4 4  Hip extension 4 4  Hip abduction 4 4  Hip adduction 4 4  Hip internal rotation 4 4  Hip external rotation 4 4  Knee flexion 4 4  Knee extension 4 4  Ankle dorsiflexion 4 4  Ankle plantarflexion 4 4  Ankle inversion 4 4  Ankle eversion 4 4  (Blank rows = not tested)     FUNCTIONAL TESTS (EVALUATION):  5 times sit to stand: 14 sec without UE support Timed up and go (TUG): 15.67 sec with RW 10 meter walk test: 10.67 sec = 0.94 m/s using RW Berg Balance Scale: 48/56- see  flowsheet for details  PATIENT SURVEYS:  FOTO : 49 8/24: 47  TODAY'S TREATMENT:  TherEx   Swiss ball TrA activation 10x  seated Swiss ball TrA activation pressing into ball with UE raise 10x Hip flexion into RTB across bars with cues for widening BOS x 10   NMR:    Standing with CGA next to support surface:   Airex pad: static stand 30 seconds x 2 trials, noticeable trembling of ankles/LE's with fatigue and challenge to maintain stability Airex pad: horizontal head turns 30 seconds scanning room 10x ; cueing for arc of motion  Airex pad throw ball into hoop for pertubation's x 4 minutes    Lateral step into warrior pose and return to stance position with turn and look.  Large step over three consecutive hurdles 8x length of // bars Large step over theraband and clap 10x each LE   Seated on dynadisc: -maintain static balance x 1 minute -Lateral weight shift 10x each side -march 10x each side  Seated 6" step toe taps 30 seconds x 2 trials  PATIENT EDUCATION: Education details: Pt educated throughout session about proper posture and technique with exercises. Improved exercise technique, movement at target joints, use of target muscles after min to mod verbal, visual, tactile cues.  Person educated: Patient Education method: Explanation, Demonstration, Tactile cues, and Verbal cues Education comprehension: verbalized understanding, returned demonstration, verbal cues required, tactile cues required, and needs further education   HOME EXERCISE PROGRAM: No updates on this date, pt to continue HEP as previously given: PWR! Basic 4 seated x 10 ea, handout provided: addended to not perform seated PRW! Rock exercise and added the below to program on 09/25/21  Access Code: [email or web address] URL: [email or web address] Date: 09/25/2021 Prepared by: Rivka Barbara  Exercises - Standing Romberg to 3/4 Tandem Stance  - 1 x daily - 7 x weekly - 2 sets - 30 seocnd hold - Standing  March with Counter Support  - 1 x daily - 7 x weekly - 2 sets - 10 reps - Modified Single Leg Balance on Step  - 1 x daily - 7 x  weekly - 2 sets - 30 second hold  Access Code: [email or web address] URL: [email or web address] Date: 10/21/2021 Prepared by: Janna Arch  Exercises - Seated Abdominal Press into [employer]  - 1 x daily - 7 x weekly - 2 sets - 10 reps - 5 hold - Seated Flexion Stretch with Swiss Ball  - 1 x daily - 7 x weekly - 2 sets - 10 reps - 5 hold  GOALS: Goals reviewed with patient? Yes  SHORT TERM GOALS: Target date: 10/02/2021  Pt will be independent with initial HEP in order to improve strength and balance in order to decrease fall risk and improve function at home and work.  Baseline: 08/21/2021-No formal HEP in place Goal status: MET    LONG TERM GOALS: Target date: 02/05/2022  Pt will improve BERG by at least 3 points in order to demonstrate clinically significant improvement in balance.    Baseline: 08/21/2021= 48/56 8/24:47/56; 11/13/2021= 47/56 Goal status: ongoing  2.  Pt will be independent with final HEP in order to improve strength and balance in order to decrease fall risk and improve function at home and work.  Baseline: No HEP in place, 8/24: completing current HEP but still progressing; 11/13/2021- Patient continues to verbalize and demonstrate understanding of HEP for stretching/strengthening and mobility but still progressing and altering program as appropriate Goal status: IN PROGRESS  3.  Pt will decrease 5TSTS by at least 4 seconds in order to demonstrate clinically significant improvement in LE strength. Baseline: 08/21/2021= 19.01 sec without UE support 8/24:10.56 sec Goal status: MET  4.  Pt will decrease TUG to below 14 seconds/decrease in order to demonstrate decreased fall risk. Baseline:  08/21/2021=15.67 with RW 8/24: 12.41 Goal status: MET  5.  Pt will improve FOTO to target score of 56 to display perceived improvements in ability to  complete ADL's.  Baseline: 08/21/2021=49 8/24: 45.6; 11/13/2021=46 Goal status: IN PROGRESS  6.  Pt will increase 10MWT by at least 0.15 m/s in order to demonstrate clinically significant improvement in community ambulation.   Baseline: 08/22/2021=0.94 m/s 8/24:1.79ms Goal status: MET  7 Pt will improve miniBESTest by at least 4 points to demonstrate clinically significant improvement in reduced falls with parkinsonian related deficits.  Baseline: 19/28; 11/13/2021- Unable to test due to time constraint- will test next session and update goal. 9/19: 18/28  Goal Status: ONGOING  8. Pt will increase 6MWT by at least [id number] order to demonstrate clinically significant improvement in cardiopulmonary endurance and community ambulation  Baseline: 11/13/2021= 650 feet with walker Goal Status: NEW  ASSESSMENT:  CLINICAL IMPRESSION:  Patient has increased tremors throughout session due to riming of day with timing of medication. He remains highly motivated throughout session. Patient does have intermittent back pain requiring use of heat pad with seated rest breaks. Core stabilization in combination with stability emphasized this morning.  Patient will continue to benefit from skilled physical therapy to improve stability, mobility, and strength for improved quality of life.       OBJECTIVE IMPAIRMENTS Abnormal gait, decreased activity tolerance, decreased balance, decreased coordination, decreased endurance, decreased knowledge of use of DME, decreased mobility, difficulty walking, decreased ROM, decreased strength, hypomobility, and impaired flexibility.   ACTIVITY LIMITATIONS carrying, lifting, bending, standing, squatting, sleeping, stairs, transfers, continence, bathing, toileting, and dressing  PARTICIPATION LIMITATIONS: cleaning, laundry, medication management, driving, shopping, community activity, and yard work  PERSONAL FACTORS Age are also affecting patient's functional outcome.    REHAB POTENTIAL: Good  CLINICAL DECISION MAKING: Stable/uncomplicated  EVALUATION COMPLEXITY: Low  PLAN: PT FREQUENCY: 2x/week  PT DURATION: 12 weeks  PLANNED INTERVENTIONS: Therapeutic exercises, Therapeutic activity, Neuromuscular re-education, Balance training, Gait training, Patient/Family education, Joint mobilization, Stair training, Vestibular training, Canalith repositioning, DME instructions, Dry Needling, Electrical stimulation, Spinal manipulation, and Spinal mobilization  PLAN FOR NEXT SESSION: continue balance training, monitor hypotension and orthostatics, ROM for flexibility, continue plan  Janna Arch PT  11/20/21, 11:00 AM

## 2021-11-20 ENCOUNTER — Ambulatory Visit: Payer: Medicare HMO

## 2021-11-20 ENCOUNTER — Encounter: Payer: Medicare HMO | Admitting: Speech Pathology

## 2021-11-20 ENCOUNTER — Encounter: Payer: Medicare HMO | Admitting: Occupational Therapy

## 2021-11-20 DIAGNOSIS — M6281 Muscle weakness (generalized): Secondary | ICD-10-CM

## 2021-11-20 DIAGNOSIS — G20 Parkinson's disease: Secondary | ICD-10-CM | POA: Diagnosis not present

## 2021-11-20 DIAGNOSIS — R278 Other lack of coordination: Secondary | ICD-10-CM

## 2021-11-20 DIAGNOSIS — R2681 Unsteadiness on feet: Secondary | ICD-10-CM

## 2021-11-20 DIAGNOSIS — R262 Difficulty in walking, not elsewhere classified: Secondary | ICD-10-CM | POA: Diagnosis not present

## 2021-11-20 DIAGNOSIS — R269 Unspecified abnormalities of gait and mobility: Secondary | ICD-10-CM | POA: Diagnosis not present

## 2021-11-20 DIAGNOSIS — R2689 Other abnormalities of gait and mobility: Secondary | ICD-10-CM | POA: Diagnosis not present

## 2021-11-23 NOTE — Therapy (Signed)
OUTPATIENT OCCUPATIONAL THERAPY NEURO TREATMENT NOTE  Patient Name: Joseph Arroyo MRN: [id number] DOB:12-Oct-1940, 81 y.o., male Today's Date: 11/23/2021  PCP: Dr. Nobie Putnam REFERRING PROVIDER: Dr. Lavena Bullion     OT End of Session - 11/23/21 1743     Visit Number 19    Number of Visits 20    Date for OT Re-Evaluation 12/09/21    Authorization Time Period Reporting period beginning 10/23/21    OT Start Time 31    OT Stop Time 1145    OT Time Calculation (min) 45 min    Equipment Utilized During Treatment RW    Activity Tolerance Patient tolerated treatment well    Behavior During Therapy [facility] for tasks assessed/performed             Past Medical History:  Diagnosis Date   Allergic rhinitis due to pollen 11/21/2007   Brachial neuritis or radiculitis NOS    Cervicalgia    Concussion    age 36 - s/p accident   Costal chondritis    Depression    Essential and other specified forms of tremor    GERD (gastroesophageal reflux disease)    in past   Lumbago    Occlusion and stenosis of carotid artery without mention of cerebral infarction    Seizures [facility])    age 17 - after concussion   Past Surgical History:  Procedure Laterality Date   CATARACT EXTRACTION Right 07/18/14   CATARACT EXTRACTION W/PHACO Left 08/01/2014   Procedure: CATARACT EXTRACTION PHACO AND INTRAOCULAR LENS PLACEMENT ([place]);  Surgeon: Leandrew Koyanagi, MD;  Location: [place];  Service: Ophthalmology;  Laterality: Left;  IVA TOPICAL   COLONOSCOPY  2008   OTHER SURGICAL HISTORY  2002   ear surgery   STAPEDES SURGERY Right 2002   Patient Active Problem List   Diagnosis Date Noted   Dysphagia, pharyngeal phase 09/17/2021   Cervicalgia 08/05/2021   Spondylosis without myelopathy or radiculopathy, lumbosacral region 06/24/2021   Lumbosacral facet hypertrophy 06/03/2021   Lumbar central spinal stenosis, w/o neurogenic claudication (L4-5) 06/03/2021   Lumbar lateral  recess stenosis (Bilateral: L2-3, L4-5) (Right: L3-4) 06/03/2021   Lumbosacral foraminal stenosis (Bilateral: L2-3, L3-4) (Left: L5-S1) 06/03/2021   Lumbar nerve root impingement (Right: L3 at L2-3 & L3-4) 06/03/2021   Ligamentum flavum hypertrophy (L3-4, L4-5) 06/03/2021   Abnormal MRI, lumbar spine (04/23/2021) 04/24/2021   Long term prescription benzodiazepine use (alprazolam) (Xanax) 03/24/2021   Grade 1 Retrolisthesis of L2/L3 (5 mm) and L3/L4 (3 mm) 03/24/2021   Levoscoliosis of lumbar spine (L3-4 apex) 03/24/2021   DDD (degenerative disc disease), lumbosacral 03/24/2021   Lumbosacral facet arthropathy (Left: L3-4, L4-5, and L5-S1) 03/24/2021   Tricompartment osteoarthritis of knee (Left) 03/24/2021   Baker cyst (Left) 03/24/2021   Chronic low back pain (1ry area of Pain) (Bilateral) (R>L) w/o sciatica 03/24/2021   Lumbar facet syndrome (Bilateral) 03/24/2021   Chronic lower extremity pain (2ry area of Pain) (Bilateral) (L>R) 03/24/2021   Lumbosacral radiculitis/sensory radiculopathy at L2 (Bilateral) 03/24/2021   Lumbosacral radiculitis/sensory radiculopathy at L3 (Bilateral) 03/24/2021   Chronic pain syndrome 03/23/2021   Pharmacologic therapy 03/23/2021   Disorder of skeletal system 03/23/2021   Problems influencing health status 03/23/2021   PAD (peripheral artery disease) ([place]) 10/01/2020   GAD (generalized anxiety disorder) 01/13/2018   MDD (major depressive disorder) 88/89/1694   Umbilical hernia 50/38/8828   Chronic knee pain (Left) 08/11/2017   Derangement of medial meniscus, posterior horn (Left) 08/11/2017   Vitamin D  insufficiency 03/05/2016   BPH without obstruction/lower urinary tract symptoms 07/08/2015   Chronic fatigue 10/23/2014   Parkinson's disease ([place]) 09/20/2014   Major depression, recurrent, full remission ([place]) 07/26/2013   Unsteady gait 07/26/2013   Orthostatic hypotension 07/26/2013   Depression 07/26/2013   Anxiety 06/23/2013   Chronic anxiety  06/23/2013   Tremor 05/18/2013   Bilateral carotid artery stenosis 08/30/2012    ONSET DATE: 08/11/21 date of referral.  PD symptoms since 2015.  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  Eager to continue working with his dressing aids today.  PERTINENT HISTORY: Per chart, pt with hx of Parkinson's disease ([place]); Chronic knee pain (Left); Derangement of medial meniscus, posterior horn (Left); PAD (peripheral artery disease) ([place]); Chronic pain syndrome; DDD (degenerative disc disease) lumbosacral; osteoarthritis of knee (Left); Chronic low back pain, Lumbar facet syndrome (Bilateral); Chronic lower extremity pain. PRECAUTIONS: Fall  WEIGHT BEARING RESTRICTIONS No  PAIN:  Are you having pain? Yes: NPRS scale:   6/10 Pain location: low back Pain description: achy Aggravating factors: N/A, constant Relieving factors: rest, repositioning , heat  PATIENT GOALS : Increase efficiency with daily tasks, increase FMC skills   OBJECTIVE:  HAND FUNCTION: Grip strength: Right: 46 lbs; Left: 54 lbs, Lateral pinch: Right: 12 lbs, Left: 9 lbs, and 3 point pinch: Right: 11 lbs, Left: 9 lbs   COORDINATION: 9 Hole Peg test: Right: 30.3 sec; Left: 34.3 sec  TODAY'S TREATMENT:  Therapeutic Exercise: Objective measures taken and goals updated for recert.  Self Care: Pt brought some personal clothing to practice donning/doffing strategies, including a button up long sleeve shirt and sweater.  Utilized Geologist, engineering for [employer], and used dressing stick to help with donning/doffing.  Pt required mod vc for technique and intermittent tactile cues for postural corrections d/t posterior pelvic tilt and R lateral lean.  With postural corrections, pt better able to thread arm  through R shirt sleeve.  Reinforced EC strategies, including recognizing which types of shirts the dressing stick may be helpful or the alternate, where dressing stick may contribute to increased time and cause pt to expend more energy than necessary.  Encouraged pt to be mindful of his activity tolerance and plans for the day, and consider reaching out to spouse for help donning an item of clothing that is giving him trouble in order to save energy for other tasks that he may want to complete later in the day.  OT continued to encourage pt practice with dressing stick at home with a variety of clothing types, using mirror and arm chair with back rest to optimize support.  Pt agreed/receptive to recommendations.   PATIENT EDUCATION: Education details: dressing AE Person educated: Patient and Spouse Education method: Explanation and Verbal cues Education comprehension: verbalized understanding, returned demo   HOME EXERCISE PROGRAM:  BUE strengthening with yellow/red theraband   GOALS: Goals reviewed with patient? Yes  SHORT TERM GOALS: Target date: 09/30/21  Pt will be indep to perform HEP for increasing strength and coordination in B hands.  Baseline: HEP not yet initiated; 10th: pt performs yellow theraband and theraputty exercises regularly and independently Goal status: achieved  2.  Pt will be indep to verbalize 3 fall prevention strategies to implement during ADLs.  Baseline: Not yet initiated; 10th: Pt is able to verbalize 3+ strategies for  fall prevention (ie consistent use of walker, slow positional changes, removal of throw rugs, keep pathways clear)  Goal status: achieved   3 LONG TERM GOALS: 12/09/21  Pt will increase FOTO score by 3 or more points to indicate increased perceived functional performance with daily tasks.  Baseline: FOTO 60; 10th: will complete next session (therapist's computer unavailable); 11/11/21: 49 Goal status: ongoing  2.  Pt will be able to  efficiently send short text messages to family/friends with modified techniques as needed (ie: use of stylus, voice to text) Baseline: Spouse sends texts for pt; 10th: pt started practicing with use of stylus and will continue to assess if efficiency is better with or without; pt requires reminders to use predictive texting  Goal status: ongoing  3.  Pt will fill out a check for bill paying with 90% legibility with adapted pen as needed. Baseline: Pt reports inconsistent legibility; 100% legibility with compensatory strategies: Forearm support on table top, postural corrections, wide grip pen Goal status: achieved  4.  Pt will increase efficiency with dressing tasks, donning clothes in 15 min or less. Baseline: Spouse reports it can take pt 20-30 min to dress in the morning; 11/11/21: Pt is using reacher and long shoe horn with min vc; hip kit will arrive this week and pt will benefit from training with dressing stick to don coat and button hook  Goal status: ongoing    ASSESSMENT:  CLINICAL IMPRESSION: Pt brought some personal clothing to practice donning/doffing strategies, including a button up long sleeve shirt and sweater.  Utilized Geologist, engineering for [employer], and used dressing stick to help with donning/doffing.  Pt required mod vc for technique and intermittent tactile cues for postural corrections d/t posterior pelvic tilt and R lateral lean.  With postural corrections, pt better able to thread arm through R shirt sleeve.  Reinforced EC strategies, including recognizing which types of shirts the dressing stick may be helpful or the alternate, where dressing stick may contribute to increased time and cause pt to expend more energy than necessary.  Encouraged pt to be mindful of his activity tolerance and plans for the day, and consider reaching out to spouse for help donning an item of clothing that is giving him trouble in order to save energy for other tasks that he may want to complete  later in the day.  OT continued to encourage pt practice with dressing stick at home with a variety of clothing types, using mirror and arm chair with back rest to optimize support.  Pt agreed/receptive to recommendations. Planning 1 additional visit for ADL/AE training and discharge assessment.    PERFORMANCE DEFICITS in functional skills including ADLs, IADLs, coordination, dexterity, ROM, strength, pain, flexibility, FMC, and GMC, cognitive skills including safety awareness.  IMPAIRMENTS are limiting patient from ADLs, IADLs, and leisure.   COMORBIDITIES has co-morbidities such as scoliosis, chronic back pain and leg pain  that affects occupational performance. Patient will benefit from skilled OT to address above impairments and improve overall function.  MODIFICATION OR ASSISTANCE TO COMPLETE EVALUATION: No modification of tasks or assist necessary to complete an evaluation.  OT OCCUPATIONAL PROFILE AND HISTORY: Problem focused assessment: Including review of records relating to presenting problem.  CLINICAL DECISION MAKING: Moderate - several treatment options, min-mod task modification necessary  REHAB POTENTIAL: Good  EVALUATION COMPLEXITY: Moderate    PLAN: OT FREQUENCY: 2x/week for 2 weeks (4 additional sessions)  OT DURATION: 2 weeks (re-eval date in 4 weeks in case of missed or  rescheduled sessions)  PLANNED INTERVENTIONS: self care/ADL training, therapeutic exercise, therapeutic activity, neuromuscular re-education, manual therapy, passive range of motion, balance training, functional mobility training, moist heat, cryotherapy, patient/family education, cognitive remediation/compensation, energy conservation, coping strategies training, and DME and/or AE instructions  RECOMMENDED OTHER SERVICES: N/A  CONSULTED AND AGREED WITH PLAN OF CARE: Patient and family member/caregiver  PLAN FOR NEXT SESSION: See above  Leta Speller, MS, OTR/L   Darleene Cleaver,  OT 11/23/2021, 5:44 PM

## 2021-11-25 ENCOUNTER — Ambulatory Visit: Payer: Medicare HMO | Admitting: Physical Therapy

## 2021-11-25 ENCOUNTER — Encounter: Payer: Self-pay | Admitting: Physical Therapy

## 2021-11-25 ENCOUNTER — Ambulatory Visit: Payer: Medicare HMO

## 2021-11-25 ENCOUNTER — Encounter: Payer: Medicare HMO | Admitting: Speech Pathology

## 2021-11-25 DIAGNOSIS — R262 Difficulty in walking, not elsewhere classified: Secondary | ICD-10-CM | POA: Diagnosis not present

## 2021-11-25 DIAGNOSIS — G20 Parkinson's disease: Secondary | ICD-10-CM

## 2021-11-25 DIAGNOSIS — R2681 Unsteadiness on feet: Secondary | ICD-10-CM

## 2021-11-25 DIAGNOSIS — M6281 Muscle weakness (generalized): Secondary | ICD-10-CM | POA: Diagnosis not present

## 2021-11-25 DIAGNOSIS — R2689 Other abnormalities of gait and mobility: Secondary | ICD-10-CM | POA: Diagnosis not present

## 2021-11-25 DIAGNOSIS — R278 Other lack of coordination: Secondary | ICD-10-CM | POA: Diagnosis not present

## 2021-11-25 DIAGNOSIS — R269 Unspecified abnormalities of gait and mobility: Secondary | ICD-10-CM | POA: Diagnosis not present

## 2021-11-25 NOTE — Therapy (Signed)
OUTPATIENT PHYSICAL THERAPY NEURO TREATMENT   Patient Name: Joseph Arroyo MRN: [id number] DOB:1940/10/29, 81 y.o., male Today's Date: 11/25/2021   PCP: Olin Hauser, DO REFERRING PROVIDER: Lavena Bullion, NP   PT End of Session - 11/25/21 1255     Visit Number 19    Number of Visits 34    Date for PT Re-Evaluation 02/05/22    Authorization Type [employer]    Authorization Time Period Initial Cert 06/07/6806- 10/10/313; Recert 9/45/8592-92/06/4626    Progress Note Due on Visit 20    PT Start Time 1345    PT Stop Time 1430    PT Time Calculation (min) 45 min    Equipment Utilized During Treatment Gait belt    Activity Tolerance Patient tolerated treatment well    Behavior During Therapy [facility] for tasks assessed/performed                         Past Medical History:  Diagnosis Date   Allergic rhinitis due to pollen 11/21/2007   Brachial neuritis or radiculitis NOS    Cervicalgia    Concussion    age 51 - s/p accident   Costal chondritis    Depression    Essential and other specified forms of tremor    GERD (gastroesophageal reflux disease)    in past   Lumbago    Occlusion and stenosis of carotid artery without mention of cerebral infarction    Seizures [facility])    age 86 - after concussion   Past Surgical History:  Procedure Laterality Date   CATARACT EXTRACTION Right 07/18/14   CATARACT EXTRACTION W/PHACO Left 08/01/2014   Procedure: CATARACT EXTRACTION PHACO AND INTRAOCULAR LENS PLACEMENT ([place]);  Surgeon: Leandrew Koyanagi, MD;  Location: [place];  Service: Ophthalmology;  Laterality: Left;  IVA TOPICAL   COLONOSCOPY  2008   OTHER SURGICAL HISTORY  2002   ear surgery   STAPEDES SURGERY Right 2002   Patient Active Problem List   Diagnosis Date Noted   Dysphagia, pharyngeal phase 09/17/2021   Cervicalgia 08/05/2021   Spondylosis without myelopathy or radiculopathy, lumbosacral region 06/24/2021   Lumbosacral facet  hypertrophy 06/03/2021   Lumbar central spinal stenosis, w/o neurogenic claudication (L4-5) 06/03/2021   Lumbar lateral recess stenosis (Bilateral: L2-3, L4-5) (Right: L3-4) 06/03/2021   Lumbosacral foraminal stenosis (Bilateral: L2-3, L3-4) (Left: L5-S1) 06/03/2021   Lumbar nerve root impingement (Right: L3 at L2-3 & L3-4) 06/03/2021   Ligamentum flavum hypertrophy (L3-4, L4-5) 06/03/2021   Abnormal MRI, lumbar spine (04/23/2021) 04/24/2021   Long term prescription benzodiazepine use (alprazolam) (Xanax) 03/24/2021   Grade 1 Retrolisthesis of L2/L3 (5 mm) and L3/L4 (3 mm) 03/24/2021   Levoscoliosis of lumbar spine (L3-4 apex) 03/24/2021   DDD (degenerative disc disease), lumbosacral 03/24/2021   Lumbosacral facet arthropathy (Left: L3-4, L4-5, and L5-S1) 03/24/2021   Tricompartment osteoarthritis of knee (Left) 03/24/2021   Baker cyst (Left) 03/24/2021   Chronic low back pain (1ry area of Pain) (Bilateral) (R>L) w/o sciatica 03/24/2021   Lumbar facet syndrome (Bilateral) 03/24/2021   Chronic lower extremity pain (2ry area of Pain) (Bilateral) (L>R) 03/24/2021   Lumbosacral radiculitis/sensory radiculopathy at L2 (Bilateral) 03/24/2021   Lumbosacral radiculitis/sensory radiculopathy at L3 (Bilateral) 03/24/2021   Chronic pain syndrome 03/23/2021   Pharmacologic therapy 03/23/2021   Disorder of skeletal system 03/23/2021   Problems influencing health status 03/23/2021   PAD (peripheral artery disease) ([place]) 10/01/2020   GAD (generalized anxiety disorder) 01/13/2018  MDD (major depressive disorder) 61/95/0932   Umbilical hernia 67/01/4579   Chronic knee pain (Left) 08/11/2017   Derangement of medial meniscus, posterior horn (Left) 08/11/2017   Vitamin D insufficiency 03/05/2016   BPH without obstruction/lower urinary tract symptoms 07/08/2015   Chronic fatigue 10/23/2014   Parkinson's disease ([place]) 09/20/2014   Major depression, recurrent, full remission ([place]) 07/26/2013   Unsteady  gait 07/26/2013   Orthostatic hypotension 07/26/2013   Depression 07/26/2013   Anxiety 06/23/2013   Chronic anxiety 06/23/2013   Tremor 05/18/2013   Bilateral carotid artery stenosis 08/30/2012    ONSET DATE: 08/11/2021  REFERRING DIAG: Parkinsons Disease  THERAPY DIAG:  Muscle weakness (generalized)  Unsteadiness on feet  Difficulty in walking, not elsewhere classified  Abnormality of gait and mobility  Other abnormalities of gait and mobility  Rationale for Evaluation and Treatment Rehabilitation  SUBJECTIVE:                                                                                                                                                                                              SUBJECTIVE STATEMENT:   Pt reports low back pain slightly improved from usual pain. Pt caregiver reports near fall the other day when attempting to ambulate without his walker.   Pt accompanied by: significant other  PERTINENT HISTORY: Per chart history provided by Dr. Consuela Mimes- [name] has Parkinson's disease ([place]); Chronic knee pain (Left); Derangement of medial meniscus, posterior horn (Left); PAD (peripheral artery disease) ([place]); Chronic pain syndrome; Grade 1 Retrolisthesis of L2/L3 (5 mm) and L3/L4 (3 mm); Levoscoliosis of lumbar spine (L3-4 apex); DDD (degenerative disc disease), lumbosacral; Lumbosacral facet arthropathy (Left: L3-4, L4-5, and L5-S1); Tricompartment osteoarthritis of knee (Left); Baker cyst (Left); Chronic low back pain (1ry area of Pain) (Bilateral) (R>L) w/o sciatica; Lumbar facet syndrome (Bilateral); Chronic lower extremity pain (2ry area of Pain) (Bilateral) (L>R); Lumbosacral radiculitis/sensory radiculopathy at L2 (Bilateral); Lumbosacral radiculitis/sensory radiculopathy at L3 (Bilateral); Abnormal MRI, lumbar spine (04/23/2021); Lumbosacral facet hypertrophy; Lumbar central spinal stenosis, w/o neurogenic claudication (L4-5); Lumbar lateral recess  stenosis (Bilateral: L2-3, L4-5) (Right: L3-4); Lumbosacral foraminal stenosis (Bilateral: L2-3, L3-4) (Left: L5-S1); Lumbar nerve root impingement (Right: L3 at L2-3 & L3-4); Ligamentum flavum hypertrophy (L3-4, L4-5); Spondylosis without myelopathy or radiculopathy, lumbosacral region; and Cervicalgia on their pertinent problem list   Patient has appointment for Epidural to low back next Tues 08/26/2021  PAIN:  Are you having pain? Yes: NPRS scale: 3/10 Pain location: low back Pain description: ache/soreness Aggravating factors: prolonged standing, transfers, walking Relieving factors: rest and medications  PRECAUTIONS: Fall  WEIGHT BEARING RESTRICTIONS No  FALLS: Has  patient fallen in last 6 months? Yes. Number of falls 2  LIVING ENVIRONMENT: Lives with: lives with their spouse Lives in: House/apartment Stairs: 4 Has following equipment at home: Walker - 2 wheeled  PLOF: Needs assistance with ADLs- intermittent  PATIENT GOALS  to walk better and be as active as possible. Also to get in/out of cars better.   OBJECTIVE: (objective measures completed at initial evaluation unless otherwise dated)   DIAGNOSTIC FINDINGS: IMPRESSION: 1. Severe lumbar levoscoliosis with widespread advanced disc and facet degeneration. 2. Mild spinal stenosis at L4-5. 3. Moderate neural foraminal stenosis on the right at L3-4 and on the left at L5-S1. 4. Moderate right lateral recess stenosis at L2-3.      LOWER EXTREMITY MMT:    MMT (evaluation)  Right Eval Left Eval  Hip flexion 4 4  Hip extension 4 4  Hip abduction 4 4  Hip adduction 4 4  Hip internal rotation 4 4  Hip external rotation 4 4  Knee flexion 4 4  Knee extension 4 4  Ankle dorsiflexion 4 4  Ankle plantarflexion 4 4  Ankle inversion 4 4  Ankle eversion 4 4  (Blank rows = not tested)     FUNCTIONAL TESTS (EVALUATION):  5 times sit to stand: 14 sec without UE support Timed up and go (TUG): 15.67 sec with RW 10  meter walk test: 10.67 sec = 0.94 m/s using RW Berg Balance Scale: 48/56- see flowsheet for details  PATIENT SURVEYS:  FOTO : 49 8/24: 47  TODAY'S TREATMENT:  TherEx   Hip flexion into RTB across bars with cues for wide BOS x 10 -progress to unilateral UE support 2 x 5 reps ( ea LE) and with switching UE support between sets   NMR: Unless otherwise stated, CGA was provided and gait belt donned in order to ensure pt safety    Standing with CGA next to support surface:   Airex pad: static stand 30 seconds x 2 trials, noticeable trembling of ankles/LE's with fatigue and challenge to maintain stability Airex pad: horizontal head turns 30 seconds scanning room 10x; cueing for arc of motion  10 x superior and inferior head nods, increased difficulty with head nods   Airex lateral step on and off x 10 ea direction   Seated on dynadisc: -maintain static balance x 1 minute -Lateral weight shift 10x each side -LAQ with cues for proper control ( attempts to move quickly) -march 10x each side   PATIENT EDUCATION: Education details: Pt educated throughout session about proper posture and technique with exercises. Improved exercise technique, movement at target joints, use of target muscles after min to mod verbal, visual, tactile cues.  Person educated: Patient Education method: Explanation, Demonstration, Tactile cues, and Verbal cues Education comprehension: verbalized understanding, returned demonstration, verbal cues required, tactile cues required, and needs further education   HOME EXERCISE PROGRAM: No updates on this date, pt to continue HEP as previously given: PWR! Basic 4 seated x 10 ea, handout provided: addended to not perform seated PRW! Rock exercise and added the below to program on 09/25/21  Access Code: [email or web address] URL: [email or web address] Date: 09/25/2021 Prepared by: Rivka Barbara  Exercises - Standing Romberg to 3/4 Tandem Stance  - 1 x daily - 7 x  weekly - 2 sets - 30 seocnd hold - Standing March with Counter Support  - 1 x daily - 7 x weekly - 2 sets - 10 reps - Modified Single Leg Balance on Step  - 1 x  daily - 7 x weekly - 2 sets - 30 second hold  Access Code: [email or web address] URL: [email or web address] Date: 10/21/2021 Prepared by: Janna Arch  Exercises - Seated Abdominal Press into [employer]  - 1 x daily - 7 x weekly - 2 sets - 10 reps - 5 hold - Seated Flexion Stretch with Swiss Ball  - 1 x daily - 7 x weekly - 2 sets - 10 reps - 5 hold  GOALS: Goals reviewed with patient? Yes  SHORT TERM GOALS: Target date: 10/02/2021  Pt will be independent with initial HEP in order to improve strength and balance in order to decrease fall risk and improve function at home and work.  Baseline: 08/21/2021-No formal HEP in place Goal status: MET    LONG TERM GOALS: Target date: 02/05/2022  Pt will improve BERG by at least 3 points in order to demonstrate clinically significant improvement in balance.    Baseline: 08/21/2021= 48/56 8/24:47/56; 11/13/2021= 47/56 Goal status: ongoing  2.  Pt will be independent with final HEP in order to improve strength and balance in order to decrease fall risk and improve function at home and work.  Baseline: No HEP in place, 8/24: completing current HEP but still progressing; 11/13/2021- Patient continues to verbalize and demonstrate understanding of HEP for stretching/strengthening and mobility but still progressing and altering program as appropriate Goal status: IN PROGRESS  3.  Pt will decrease 5TSTS by at least 4 seconds in order to demonstrate clinically significant improvement in LE strength. Baseline: 08/21/2021= 19.01 sec without UE support 8/24:10.56 sec Goal status: MET  4.  Pt will decrease TUG to below 14 seconds/decrease in order to demonstrate decreased fall risk. Baseline:  08/21/2021=15.67 with RW 8/24: 12.41 Goal status: MET  5.  Pt will improve FOTO to target score of 56 to  display perceived improvements in ability to complete ADL's.  Baseline: 08/21/2021=49 8/24: 45.6; 11/13/2021=46 Goal status: IN PROGRESS  6.  Pt will increase 10MWT by at least 0.15 m/s in order to demonstrate clinically significant improvement in community ambulation.   Baseline: 08/22/2021=0.94 m/s 8/24:1.77ms Goal status: MET  7 Pt will improve miniBESTest by at least 4 points to demonstrate clinically significant improvement in reduced falls with parkinsonian related deficits.  Baseline: 19/28; 11/13/2021- Unable to test due to time constraint- will test next session and update goal. 9/19: 18/28  Goal Status: ONGOING  8. Pt will increase 6MWT by at least [id number] order to demonstrate clinically significant improvement in cardiopulmonary endurance and community ambulation  Baseline: 11/13/2021= 650 feet with walker Goal Status: NEW  ASSESSMENT:  CLINICAL IMPRESSION:   Patient presents with excellent motivation for completion of physical therapy activities.  Patient recently discharged from occupational therapy and Occupational Therapy recommended continuing to work on seated balance and stability.  Patient educated regarding potential purchase of stability disc for seated activities and was instructed in this today was provided with information regarding options for purchase.  Patient continued to progress with balance and lower extremity strengthening this session.  Continue to work toward goals in future sessions. Pt will continue to benefit from skilled physical therapy intervention to address impairments, improve QOL, and attain therapy goals.        OBJECTIVE IMPAIRMENTS Abnormal gait, decreased activity tolerance, decreased balance, decreased coordination, decreased endurance, decreased knowledge of use of DME, decreased mobility, difficulty walking, decreased ROM, decreased strength, hypomobility, and impaired flexibility.   ACTIVITY LIMITATIONS carrying, lifting, bending,  standing, squatting, sleeping, stairs, transfers, continence, bathing,  toileting, and dressing  PARTICIPATION LIMITATIONS: cleaning, laundry, medication management, driving, shopping, community activity, and yard work  [place] Age are also affecting patient's functional outcome.   REHAB POTENTIAL: Good  CLINICAL DECISION MAKING: Stable/uncomplicated  EVALUATION COMPLEXITY: Low  PLAN: PT FREQUENCY: 2x/week  PT DURATION: 12 weeks  PLANNED INTERVENTIONS: Therapeutic exercises, Therapeutic activity, Neuromuscular re-education, Balance training, Gait training, Patient/Family education, Joint mobilization, Stair training, Vestibular training, Canalith repositioning, DME instructions, Dry Needling, Electrical stimulation, Spinal manipulation, and Spinal mobilization  PLAN FOR NEXT SESSION: continue balance training, monitor hypotension and orthostatics, ROM for flexibility, continue plan  Particia Lather PT  11/25/21, 4:59 PM

## 2021-11-26 NOTE — Therapy (Signed)
OUTPATIENT OCCUPATIONAL THERAPY NEURO PROGRESS/DISCHARGE NOTE  Patient Name: Joseph Arroyo MRN: [id number] DOB:1940-05-20, 81 y.o., male Today's Date: 11/26/2021  PCP: Dr. Nobie Putnam REFERRING PROVIDER: Dr. Lavena Bullion     OT End of Session - 11/26/21 1352     Visit Number 20    Number of Visits 20    Date for OT Re-Evaluation 12/09/21    Authorization Time Period Reporting period beginning 10/23/21    OT Start Time 1300    OT Stop Time 1345    OT Time Calculation (min) 45 min    Equipment Utilized During Treatment RW    Activity Tolerance Patient tolerated treatment well    Behavior During Therapy [facility] for tasks assessed/performed             Past Medical History:  Diagnosis Date   Allergic rhinitis due to pollen 11/21/2007   Brachial neuritis or radiculitis NOS    Cervicalgia    Concussion    age 83 - s/p accident   Costal chondritis    Depression    Essential and other specified forms of tremor    GERD (gastroesophageal reflux disease)    in past   Lumbago    Occlusion and stenosis of carotid artery without mention of cerebral infarction    Seizures [facility])    age 52 - after concussion   Past Surgical History:  Procedure Laterality Date   CATARACT EXTRACTION Right 07/18/14   CATARACT EXTRACTION W/PHACO Left 08/01/2014   Procedure: CATARACT EXTRACTION PHACO AND INTRAOCULAR LENS PLACEMENT ([place]);  Surgeon: Leandrew Koyanagi, MD;  Location: [facility];  Service: Ophthalmology;  Laterality: Left;  IVA TOPICAL   COLONOSCOPY  2008   OTHER SURGICAL HISTORY  2002   ear surgery   STAPEDES SURGERY Right 2002   Patient Active Problem List   Diagnosis Date Noted   Dysphagia, pharyngeal phase 09/17/2021   Cervicalgia 08/05/2021   Spondylosis without myelopathy or radiculopathy, lumbosacral region 06/24/2021   Lumbosacral facet hypertrophy 06/03/2021   Lumbar central spinal stenosis, w/o neurogenic claudication (L4-5) 06/03/2021   Lumbar  lateral recess stenosis (Bilateral: L2-3, L4-5) (Right: L3-4) 06/03/2021   Lumbosacral foraminal stenosis (Bilateral: L2-3, L3-4) (Left: L5-S1) 06/03/2021   Lumbar nerve root impingement (Right: L3 at L2-3 & L3-4) 06/03/2021   Ligamentum flavum hypertrophy (L3-4, L4-5) 06/03/2021   Abnormal MRI, lumbar spine (04/23/2021) 04/24/2021   Long term prescription benzodiazepine use (alprazolam) (Xanax) 03/24/2021   Grade 1 Retrolisthesis of L2/L3 (5 mm) and L3/L4 (3 mm) 03/24/2021   Levoscoliosis of lumbar spine (L3-4 apex) 03/24/2021   DDD (degenerative disc disease), lumbosacral 03/24/2021   Lumbosacral facet arthropathy (Left: L3-4, L4-5, and L5-S1) 03/24/2021   Tricompartment osteoarthritis of knee (Left) 03/24/2021   Baker cyst (Left) 03/24/2021   Chronic low back pain (1ry area of Pain) (Bilateral) (R>L) w/o sciatica 03/24/2021   Lumbar facet syndrome (Bilateral) 03/24/2021   Chronic lower extremity pain (2ry area of Pain) (Bilateral) (L>R) 03/24/2021   Lumbosacral radiculitis/sensory radiculopathy at L2 (Bilateral) 03/24/2021   Lumbosacral radiculitis/sensory radiculopathy at L3 (Bilateral) 03/24/2021   Chronic pain syndrome 03/23/2021   Pharmacologic therapy 03/23/2021   Disorder of skeletal system 03/23/2021   Problems influencing health status 03/23/2021   PAD (peripheral artery disease) ([place]) 10/01/2020   GAD (generalized anxiety disorder) 01/13/2018   MDD (major depressive disorder) 53/66/4403   Umbilical hernia 47/42/5956   Chronic knee pain (Left) 08/11/2017   Derangement of medial meniscus, posterior horn (Left) 08/11/2017   Vitamin D  insufficiency 03/05/2016   BPH without obstruction/lower urinary tract symptoms 07/08/2015   Chronic fatigue 10/23/2014   Parkinson's disease ([place]) 09/20/2014   Major depression, recurrent, full remission ([place]) 07/26/2013   Unsteady gait 07/26/2013   Orthostatic hypotension 07/26/2013   Depression 07/26/2013   Anxiety 06/23/2013   Chronic  anxiety 06/23/2013   Tremor 05/18/2013   Bilateral carotid artery stenosis 08/30/2012    ONSET DATE: 08/11/21 date of referral.  PD symptoms since 2015.  REFERRING DIAG: Parkinson's Disease  THERAPY DIAG:  Muscle weakness (generalized)  Other lack of coordination  Atypical parkinsonism  Rationale for Evaluation and Treatment Rehabilitation  SUBJECTIVE:   SUBJECTIVE STATEMENT: Pt reports that he's been working on his dressing strategies and was able to manage to put on his shirt this morning.   PERTINENT HISTORY: Per chart, pt with hx of Parkinson's disease ([place]); Chronic knee pain (Left); Derangement of medial meniscus, posterior horn (Left); PAD (peripheral artery disease) ([place]); Chronic pain syndrome; DDD (degenerative disc disease) lumbosacral; osteoarthritis of knee (Left); Chronic low back pain, Lumbar facet syndrome (Bilateral); Chronic lower extremity pain. PRECAUTIONS: Fall  WEIGHT BEARING RESTRICTIONS No  PAIN:  Are you having pain? Yes: NPRS scale:   6/10 Pain location: low back Pain description: achy Aggravating factors: N/A, constant Relieving factors: rest, repositioning , heat  PATIENT GOALS : Increase efficiency with daily tasks, increase FMC skills   OBJECTIVE:  HAND FUNCTION: Grip strength: Right: 46 lbs; Left: 45 lbs, Lateral pinch: Right: 14 lbs, Left: 9 lbs, and 3 point pinch: Right: 12 lbs, Left: 9 lbs   COORDINATION: 9 Hole Peg test: Right: 27 sec; Left: 31 sec  TODAY'S TREATMENT:  Therapeutic Exercise: Objective measures taken and goals updated for progress note/discharge assessment.  Reviewed HEP with pt reporting good compliance.  Self Care: Pt practiced donning short sleeve button up shirt and zip up coat.  With initial cues for erect sitting posture, cues for hand set up in shirt sleeves, but was able to lift shirt overhead and finish threading hands through sleeves.  With pt's coat, pt was able to carry over same strategy and lift coat over  L shoulder, overhead, and finish moving hands through sleeves once coat was behind him.  Pt practiced zipping coat part way and putting head through, but then struggled to reach behind him to pull coat down behind his back.  Pt found that lifting coat over L shoulder took less effort.  Able to manage all techniques without use of dressing stick this date.  PATIENT EDUCATION: Education details: dressing strategies  Person educated: Patient and Spouse Education method: Explanation and Verbal cues Education comprehension: verbalized understanding, returned demo   HOME EXERCISE PROGRAM:  BUE strengthening with yellow/red theraband, theraputty    GOALS: Goals reviewed with patient? Yes  SHORT TERM GOALS: Target date: 09/30/21  Pt will be indep to perform HEP for increasing strength and coordination in B hands.  Baseline: HEP not yet initiated; 10th: pt performs yellow theraband and theraputty exercises regularly and independently Goal status: achieved  2.  Pt will be indep to verbalize 3 fall prevention strategies to implement during ADLs.  Baseline: Not yet initiated; 10th: Pt is able to verbalize 3+ strategies for fall prevention (ie consistent use of walker, slow positional changes, removal of throw rugs, keep pathways clear)  Goal status: achieved   3 LONG TERM GOALS: 12/09/21  Pt will increase FOTO score by 3 or more points to indicate increased perceived functional performance with daily tasks.  Baseline: FOTO 60; 10th: will complete next session (therapist's computer unavailable); 11/11/21: 49; 11/25/21 d/c: 45.6 Goal status: ongoing  2.  Pt will be able to efficiently send short text messages to family/friends with modified techniques as needed (ie: use of stylus, voice to text) Baseline: Spouse sends texts for pt; 10th: pt started practicing with use of stylus and will continue to assess if efficiency is better with or without; pt requires reminders to use predictive texting;  11/25/21: pt continues to practice with stylus and predictive texts; extra time to send text messages.   Goal status: discharge; max potential met  3.  Pt will fill out a check for bill paying with 90% legibility with adapted pen as needed. Baseline: Pt reports inconsistent legibility; 100% legibility with compensatory strategies: Forearm support on table top, postural corrections, wide grip pen Goal status: achieved  4.  Pt will increase efficiency with dressing tasks, donning clothes in 15 min or less. Baseline: Spouse reports it can take pt 20-30 min to dress in the morning; 11/11/21: Pt is using reacher and long shoe horn with min vc; hip kit will arrive this week and pt will benefit from training with dressing stick to don coat and button hook; 11/25/21 d/c: pt estimates that it takes him 15 min to dress using new strategies with AE and sitting in chair with back and arm rests. Goal status: met    ASSESSMENT:  CLINICAL IMPRESSION: Pt was seen by OT for 20 sessions to establish BUE HEP, provide activities for increasing hand strength and dexterity, and provide training for ADL/IADL strategies with and without use of AE.  At discharge, pt demos indep with HEP for BUEs.  Dexterity has improved as noted by 9 hole peg test bilaterally (see note), though pt acknowledges "good" and "bad" days with his parkinson's symptoms, and time of day will also play a role in pt's performance.  Pt has been provided education on EC, positioning strategies, and AE in order to maximize efficiency and indep with UB/LB dressing tasks.  Pt has been consistent to practice strategies and estimates that his dressing efficiency has improved somewhat, estimating that it would take him 20-30 min to dress at eval, and pt now estimates ~15 min on "a good day."  Pt has met max rehab potential for OT at this time.  Goals sufficiently met.  No further skilled OT indicated at this time.  PERFORMANCE DEFICITS in functional skills  including ADLs, IADLs, coordination, dexterity, ROM, strength, pain, flexibility, FMC, and GMC, cognitive skills including safety awareness.  IMPAIRMENTS are limiting patient from ADLs, IADLs, and leisure.   COMORBIDITIES has co-morbidities such as scoliosis, chronic back pain and leg pain  that affects occupational performance. Patient will benefit from skilled OT to address above impairments and improve overall function.  MODIFICATION OR ASSISTANCE TO COMPLETE EVALUATION: No modification of tasks or assist necessary to complete an evaluation.  OT OCCUPATIONAL PROFILE AND HISTORY: Problem focused assessment: Including review of records relating to presenting problem.  CLINICAL DECISION MAKING: Moderate - several treatment options, min-mod task modification necessary  REHAB POTENTIAL: Good  EVALUATION COMPLEXITY: Moderate    PLAN: OT FREQUENCY: 2x/week for 2 weeks (4 additional sessions)  OT DURATION: 2 weeks (re-eval date in 4 weeks in case of missed or rescheduled sessions)  PLANNED INTERVENTIONS: self care/ADL training, therapeutic exercise, therapeutic activity, neuromuscular re-education, manual therapy, passive range of motion, balance training, functional mobility training, moist heat, cryotherapy, patient/family education, cognitive remediation/compensation, energy conservation, coping strategies  training, and DME and/or AE instructions  RECOMMENDED OTHER SERVICES: N/A  CONSULTED AND AGREED WITH PLAN OF CARE: Patient and family member/caregiver  PLAN FOR NEXT SESSION: N/A; discharge this visit   Leta Speller, MS, OTR/L   Darleene Cleaver, OT 11/26/2021, 1:54 PM

## 2021-11-27 ENCOUNTER — Ambulatory Visit: Payer: Medicare HMO

## 2021-11-27 ENCOUNTER — Encounter: Payer: Medicare HMO | Admitting: Speech Pathology

## 2021-11-27 ENCOUNTER — Ambulatory Visit: Payer: Medicare HMO | Admitting: Occupational Therapy

## 2021-11-27 DIAGNOSIS — M6281 Muscle weakness (generalized): Secondary | ICD-10-CM

## 2021-11-27 DIAGNOSIS — G20 Parkinson's disease: Secondary | ICD-10-CM | POA: Diagnosis not present

## 2021-11-27 DIAGNOSIS — R2681 Unsteadiness on feet: Secondary | ICD-10-CM | POA: Diagnosis not present

## 2021-11-27 DIAGNOSIS — R278 Other lack of coordination: Secondary | ICD-10-CM | POA: Diagnosis not present

## 2021-11-27 DIAGNOSIS — R269 Unspecified abnormalities of gait and mobility: Secondary | ICD-10-CM | POA: Diagnosis not present

## 2021-11-27 DIAGNOSIS — R262 Difficulty in walking, not elsewhere classified: Secondary | ICD-10-CM

## 2021-11-27 DIAGNOSIS — R2689 Other abnormalities of gait and mobility: Secondary | ICD-10-CM | POA: Diagnosis not present

## 2021-11-27 NOTE — Therapy (Signed)
OUTPATIENT PHYSICAL THERAPY NEURO TREATMENT/ Physical Therapy Progress Note   Dates of reporting period  10/23/21   to   11/27/21    Patient Name: Joseph Arroyo MRN: [id number] DOB:1940/08/13, 81 y.o., male Today's Date: 11/27/2021   PCP: Joseph Hauser, DO REFERRING PROVIDER: Lavena Bullion, NP   PT End of Session - 11/27/21 1433     Visit Number 20    Number of Visits 40    Date for PT Re-Evaluation 02/05/22    Authorization Type [employer]    Authorization Time Period Initial Cert 11/15/9448- 3/88/8280; Recert 0/34/9179-15/0/5697; PN 11/27/21    Progress Note Due on Visit 20    PT Start Time 1430    PT Stop Time 1514    PT Time Calculation (min) 44 min    Equipment Utilized During Treatment Gait belt    Activity Tolerance Patient tolerated treatment well    Behavior During Therapy [facility] for tasks assessed/performed                          Past Medical History:  Diagnosis Date   Allergic rhinitis due to pollen 11/21/2007   Brachial neuritis or radiculitis NOS    Cervicalgia    Concussion    age 68 - s/p accident   Costal chondritis    Depression    Essential and other specified forms of tremor    GERD (gastroesophageal reflux disease)    in past   Lumbago    Occlusion and stenosis of carotid artery without mention of cerebral infarction    Seizures [facility])    age 62 - after concussion   Past Surgical History:  Procedure Laterality Date   CATARACT EXTRACTION Right 07/18/14   CATARACT EXTRACTION W/PHACO Left 08/01/2014   Procedure: CATARACT EXTRACTION PHACO AND INTRAOCULAR LENS PLACEMENT ([place]);  Surgeon: Leandrew Koyanagi, MD;  Location: [place];  Service: Ophthalmology;  Laterality: Left;  IVA TOPICAL   COLONOSCOPY  2008   OTHER SURGICAL HISTORY  2002   ear surgery   STAPEDES SURGERY Right 2002   Patient Active Problem List   Diagnosis Date Noted   Dysphagia, pharyngeal phase 09/17/2021   Cervicalgia 08/05/2021    Spondylosis without myelopathy or radiculopathy, lumbosacral region 06/24/2021   Lumbosacral facet hypertrophy 06/03/2021   Lumbar central spinal stenosis, w/o neurogenic claudication (L4-5) 06/03/2021   Lumbar lateral recess stenosis (Bilateral: L2-3, L4-5) (Right: L3-4) 06/03/2021   Lumbosacral foraminal stenosis (Bilateral: L2-3, L3-4) (Left: L5-S1) 06/03/2021   Lumbar nerve root impingement (Right: L3 at L2-3 & L3-4) 06/03/2021   Ligamentum flavum hypertrophy (L3-4, L4-5) 06/03/2021   Abnormal MRI, lumbar spine (04/23/2021) 04/24/2021   Long term prescription benzodiazepine use (alprazolam) (Xanax) 03/24/2021   Grade 1 Retrolisthesis of L2/L3 (5 mm) and L3/L4 (3 mm) 03/24/2021   Levoscoliosis of lumbar spine (L3-4 apex) 03/24/2021   DDD (degenerative disc disease), lumbosacral 03/24/2021   Lumbosacral facet arthropathy (Left: L3-4, L4-5, and L5-S1) 03/24/2021   Tricompartment osteoarthritis of knee (Left) 03/24/2021   Baker cyst (Left) 03/24/2021   Chronic low back pain (1ry area of Pain) (Bilateral) (R>L) w/o sciatica 03/24/2021   Lumbar facet syndrome (Bilateral) 03/24/2021   Chronic lower extremity pain (2ry area of Pain) (Bilateral) (L>R) 03/24/2021   Lumbosacral radiculitis/sensory radiculopathy at L2 (Bilateral) 03/24/2021   Lumbosacral radiculitis/sensory radiculopathy at L3 (Bilateral) 03/24/2021   Chronic pain syndrome 03/23/2021   Pharmacologic therapy 03/23/2021   Disorder of skeletal system 03/23/2021  Problems influencing health status 03/23/2021   PAD (peripheral artery disease) ([place]) 10/01/2020   GAD (generalized anxiety disorder) 01/13/2018   MDD (major depressive disorder) 35/70/1779   Umbilical hernia 39/05/90   Chronic knee pain (Left) 08/11/2017   Derangement of medial meniscus, posterior horn (Left) 08/11/2017   Vitamin D insufficiency 03/05/2016   BPH without obstruction/lower urinary tract symptoms 07/08/2015   Chronic fatigue 10/23/2014   Parkinson's  disease ([place]) 09/20/2014   Major depression, recurrent, full remission ([place]) 07/26/2013   Unsteady gait 07/26/2013   Orthostatic hypotension 07/26/2013   Depression 07/26/2013   Anxiety 06/23/2013   Chronic anxiety 06/23/2013   Tremor 05/18/2013   Bilateral carotid artery stenosis 08/30/2012    ONSET DATE: 08/11/2021  REFERRING DIAG: Parkinsons Disease  THERAPY DIAG:  Muscle weakness (generalized)  Atypical parkinsonism  Unsteadiness on feet  Difficulty in walking, not elsewhere classified  Rationale for Evaluation and Treatment Rehabilitation  SUBJECTIVE:                                                                                                                                                                                              SUBJECTIVE STATEMENT:   Patient reports no falls or LOB since last session.  Pt accompanied by: significant other  PERTINENT HISTORY: Per chart history provided by Dr. Consuela Mimes- [name] has Parkinson's disease ([place]); Chronic knee pain (Left); Derangement of medial meniscus, posterior horn (Left); PAD (peripheral artery disease) ([place]); Chronic pain syndrome; Grade 1 Retrolisthesis of L2/L3 (5 mm) and L3/L4 (3 mm); Levoscoliosis of lumbar spine (L3-4 apex); DDD (degenerative disc disease), lumbosacral; Lumbosacral facet arthropathy (Left: L3-4, L4-5, and L5-S1); Tricompartment osteoarthritis of knee (Left); Baker cyst (Left); Chronic low back pain (1ry area of Pain) (Bilateral) (R>L) w/o sciatica; Lumbar facet syndrome (Bilateral); Chronic lower extremity pain (2ry area of Pain) (Bilateral) (L>R); Lumbosacral radiculitis/sensory radiculopathy at L2 (Bilateral); Lumbosacral radiculitis/sensory radiculopathy at L3 (Bilateral); Abnormal MRI, lumbar spine (04/23/2021); Lumbosacral facet hypertrophy; Lumbar central spinal stenosis, w/o neurogenic claudication (L4-5); Lumbar lateral recess stenosis (Bilateral: L2-3, L4-5) (Right: L3-4); Lumbosacral  foraminal stenosis (Bilateral: L2-3, L3-4) (Left: L5-S1); Lumbar nerve root impingement (Right: L3 at L2-3 & L3-4); Ligamentum flavum hypertrophy (L3-4, L4-5); Spondylosis without myelopathy or radiculopathy, lumbosacral region; and Cervicalgia on their pertinent problem list   Patient has appointment for Epidural to low back next Tues 08/26/2021  PAIN:  Are you having pain? Yes: NPRS scale: 3/10 Pain location: low back Pain description: ache/soreness Aggravating factors: prolonged standing, transfers, walking Relieving factors: rest and medications  PRECAUTIONS: Fall  WEIGHT BEARING RESTRICTIONS No  FALLS: Has patient fallen in last 6  months? Yes. Number of falls 2  LIVING ENVIRONMENT: Lives with: lives with their spouse Lives in: House/apartment Stairs: 4 Has following equipment at home: Walker - 2 wheeled  PLOF: Needs assistance with ADLs- intermittent  PATIENT GOALS  to walk better and be as active as possible. Also to get in/out of cars better.   OBJECTIVE: (objective measures completed at initial evaluation unless otherwise dated)   DIAGNOSTIC FINDINGS: IMPRESSION: 1. Severe lumbar levoscoliosis with widespread advanced disc and facet degeneration. 2. Mild spinal stenosis at L4-5. 3. Moderate neural foraminal stenosis on the right at L3-4 and on the left at L5-S1. 4. Moderate right lateral recess stenosis at L2-3.      LOWER EXTREMITY MMT:    MMT (evaluation)  Right Eval Left Eval  Hip flexion 4 4  Hip extension 4 4  Hip abduction 4 4  Hip adduction 4 4  Hip internal rotation 4 4  Hip external rotation 4 4  Knee flexion 4 4  Knee extension 4 4  Ankle dorsiflexion 4 4  Ankle plantarflexion 4 4  Ankle inversion 4 4  Ankle eversion 4 4  (Blank rows = not tested)     FUNCTIONAL TESTS (EVALUATION):  5 times sit to stand: 14 sec without UE support Timed up and go (TUG): 15.67 sec with RW 10 meter walk test: 10.67 sec = 0.94 m/s using RW Berg Balance  Scale: 48/56- see flowsheet for details  PATIENT SURVEYS:  FOTO : 49 8/24: 47  TODAY'S TREATMENT:     Standing with CGA next to support surface:   Airex pad 6" step 30 seconds x 2 trials each LE Airex pad cone taps 10x taps each LE  Airex 4" step up/down lateral step on and off x 10 ea direction   Seated on dynadisc: -maintain static balance x 1 minute -LAQ with cues for proper control ( attempts to move quickly) -march 10x each side -contralateral UE/LE raises(very challenging for patient) 10x each   Seated  TrA activation 15x 3-5 seconds swiss ball TrA activation with UE raise swiss ball 10x each arm Adduction ball squeeze 10x  PATIENT EDUCATION: Education details: Pt educated throughout session about proper posture and technique with exercises. Improved exercise technique, movement at target joints, use of target muscles after min to mod verbal, visual, tactile cues.  Person educated: Patient Education method: Explanation, Demonstration, Tactile cues, and Verbal cues Education comprehension: verbalized understanding, returned demonstration, verbal cues required, tactile cues required, and needs further education   HOME EXERCISE PROGRAM: No updates on this date, pt to continue HEP as previously given: PWR! Basic 4 seated x 10 ea, handout provided: addended to not perform seated PRW! Rock exercise and added the below to program on 09/25/21  Access Code: [email or web address] URL: [email or web address] Date: 09/25/2021 Prepared by: Rivka Barbara  Exercises - Standing Romberg to 3/4 Tandem Stance  - 1 x daily - 7 x weekly - 2 sets - 30 seocnd hold - Standing March with Counter Support  - 1 x daily - 7 x weekly - 2 sets - 10 reps - Modified Single Leg Balance on Step  - 1 x daily - 7 x weekly - 2 sets - 30 second hold  Access Code: [email or web address] URL: [email or web address] Date: 10/21/2021 Prepared by: Janna Arch  Exercises - Seated Abdominal Press into  [employer]  - 1 x daily - 7 x weekly - 2 sets - 10 reps - 5 hold - Seated Flexion Stretch with Swiss Diona Foley  -  1 x daily - 7 x weekly - 2 sets - 10 reps - 5 hold  GOALS: Goals reviewed with patient? Yes  SHORT TERM GOALS: Target date: 10/02/2021  Pt will be independent with initial HEP in order to improve strength and balance in order to decrease fall risk and improve function at home and work.  Baseline: 08/21/2021-No formal HEP in place Goal status: MET    LONG TERM GOALS: Target date: 02/05/2022  Pt will improve BERG by at least 3 points in order to demonstrate clinically significant improvement in balance.    Baseline: 08/21/2021= 48/56 8/24:47/56; 11/13/2021= 47/56 Goal status: ongoing  2.  Pt will be independent with final HEP in order to improve strength and balance in order to decrease fall risk and improve function at home and work.  Baseline: No HEP in place, 8/24: completing current HEP but still progressing; 11/13/2021- Patient continues to verbalize and demonstrate understanding of HEP for stretching/strengthening and mobility but still progressing and altering program as appropriate Goal status: IN PROGRESS  3.  Pt will decrease 5TSTS by at least 4 seconds in order to demonstrate clinically significant improvement in LE strength. Baseline: 08/21/2021= 19.01 sec without UE support 8/24:10.56 sec Goal status: MET  4.  Pt will decrease TUG to below 14 seconds/decrease in order to demonstrate decreased fall risk. Baseline:  08/21/2021=15.67 with RW 8/24: 12.41 Goal status: MET  5.  Pt will improve FOTO to target score of 56 to display perceived improvements in ability to complete ADL's.  Baseline: 08/21/2021=49 8/24: 45.6; 11/13/2021=46 Goal status: IN PROGRESS  6.  Pt will increase 10MWT by at least 0.15 m/s in order to demonstrate clinically significant improvement in community ambulation.   Baseline: 08/22/2021=0.94 m/s 8/24:1.36ms Goal status: MET  7 Pt will improve  miniBESTest by at least 4 points to demonstrate clinically significant improvement in reduced falls with parkinsonian related deficits.  Baseline: 19/28; 11/13/2021- Unable to test due to time constraint- will test next session and update goal. 9/19: 18/28  Goal Status: ONGOING  8. Pt will increase 6MWT by at least [id number] order to demonstrate clinically significant improvement in cardiopulmonary endurance and community ambulation  Baseline: 11/13/2021= 650 feet with walker Goal Status: NEW  ASSESSMENT:  CLINICAL IMPRESSION:   Goals performed 11/13/21, please refer to this note for further details. Patient's condition has the potential to improve in response to therapy. Maximum improvement is yet to be obtained. The anticipated improvement is attainable and reasonable in a generally predictable time. Patient performs balance and core strengthening intervention with minimal pain increase this session.  Utilization of heat pad for back pain reduction performed. Frequent cueing for reduction of velocity for improved amplitude and coordination. Pt will continue to benefit from skilled physical therapy intervention to address impairments, improve QOL, and attain therapy goals.        OBJECTIVE IMPAIRMENTS Abnormal gait, decreased activity tolerance, decreased balance, decreased coordination, decreased endurance, decreased knowledge of use of DME, decreased mobility, difficulty walking, decreased ROM, decreased strength, hypomobility, and impaired flexibility.   ACTIVITY LIMITATIONS carrying, lifting, bending, standing, squatting, sleeping, stairs, transfers, continence, bathing, toileting, and dressing  PARTICIPATION LIMITATIONS: cleaning, laundry, medication management, driving, shopping, community activity, and yard work  PERSONAL FACTORS Age are also affecting patient's functional outcome.   REHAB POTENTIAL: Good  CLINICAL DECISION MAKING: Stable/uncomplicated  EVALUATION COMPLEXITY:  Low  PLAN: PT FREQUENCY: 2x/week  PT DURATION: 12 weeks  PLANNED INTERVENTIONS: Therapeutic exercises, Therapeutic activity, Neuromuscular re-education, Balance training, Gait  training, Patient/Family education, Joint mobilization, Stair training, Vestibular training, Canalith repositioning, DME instructions, Dry Needling, Electrical stimulation, Spinal manipulation, and Spinal mobilization  PLAN FOR NEXT SESSION: continue balance training, monitor hypotension and orthostatics, ROM for flexibility, continue plan  Janna Arch PT  11/27/21, 4:08 PM

## 2021-12-01 ENCOUNTER — Ambulatory Visit: Payer: Medicare HMO | Attending: Neurology | Admitting: Physical Therapy

## 2021-12-01 DIAGNOSIS — R41841 Cognitive communication deficit: Secondary | ICD-10-CM | POA: Insufficient documentation

## 2021-12-01 DIAGNOSIS — G20C Parkinsonism, unspecified: Secondary | ICD-10-CM | POA: Diagnosis not present

## 2021-12-01 DIAGNOSIS — R490 Dysphonia: Secondary | ICD-10-CM | POA: Diagnosis not present

## 2021-12-01 DIAGNOSIS — R262 Difficulty in walking, not elsewhere classified: Secondary | ICD-10-CM | POA: Diagnosis not present

## 2021-12-01 DIAGNOSIS — R471 Dysarthria and anarthria: Secondary | ICD-10-CM | POA: Insufficient documentation

## 2021-12-01 DIAGNOSIS — R278 Other lack of coordination: Secondary | ICD-10-CM | POA: Insufficient documentation

## 2021-12-01 DIAGNOSIS — R1312 Dysphagia, oropharyngeal phase: Secondary | ICD-10-CM | POA: Insufficient documentation

## 2021-12-01 DIAGNOSIS — R269 Unspecified abnormalities of gait and mobility: Secondary | ICD-10-CM | POA: Diagnosis not present

## 2021-12-01 DIAGNOSIS — R2689 Other abnormalities of gait and mobility: Secondary | ICD-10-CM | POA: Insufficient documentation

## 2021-12-01 DIAGNOSIS — M6281 Muscle weakness (generalized): Secondary | ICD-10-CM | POA: Insufficient documentation

## 2021-12-01 DIAGNOSIS — R2681 Unsteadiness on feet: Secondary | ICD-10-CM | POA: Insufficient documentation

## 2021-12-01 NOTE — Therapy (Signed)
OUTPATIENT PHYSICAL THERAPY NEURO TREATMENT   Patient Name: Joseph Arroyo MRN: [id number] DOB:September 03, 1940, 81 y.o., male Today's Date: 12/01/2021   PCP: Olin Hauser, DO REFERRING PROVIDER: Lavena Bullion, NP   PT End of Session - 12/01/21 1531     Visit Number 21    Number of Visits 28    Date for PT Re-Evaluation 02/05/22    Authorization Type [employer]    Authorization Time Period Initial Cert 3/53/6144- 05/15/4006; Recert 6/76/1950-93/04/6710; PN 11/27/21    Progress Note Due on Visit 20    PT Start Time 1518    PT Stop Time 1558    PT Time Calculation (min) 40 min    Equipment Utilized During Treatment Gait belt    Activity Tolerance Patient tolerated treatment well    Behavior During Therapy [facility] for tasks assessed/performed                           Past Medical History:  Diagnosis Date   Allergic rhinitis due to pollen 11/21/2007   Brachial neuritis or radiculitis NOS    Cervicalgia    Concussion    age 74 - s/p accident   Costal chondritis    Depression    Essential and other specified forms of tremor    GERD (gastroesophageal reflux disease)    in past   Lumbago    Occlusion and stenosis of carotid artery without mention of cerebral infarction    Seizures [facility])    age 29 - after concussion   Past Surgical History:  Procedure Laterality Date   CATARACT EXTRACTION Right 07/18/14   CATARACT EXTRACTION W/PHACO Left 08/01/2014   Procedure: CATARACT EXTRACTION PHACO AND INTRAOCULAR LENS PLACEMENT ([place]);  Surgeon: Leandrew Koyanagi, MD;  Location: [place];  Service: Ophthalmology;  Laterality: Left;  IVA TOPICAL   COLONOSCOPY  2008   OTHER SURGICAL HISTORY  2002   ear surgery   STAPEDES SURGERY Right 2002   Patient Active Problem List   Diagnosis Date Noted   Dysphagia, pharyngeal phase 09/17/2021   Cervicalgia 08/05/2021   Spondylosis without myelopathy or radiculopathy, lumbosacral region 06/24/2021    Lumbosacral facet hypertrophy 06/03/2021   Lumbar central spinal stenosis, w/o neurogenic claudication (L4-5) 06/03/2021   Lumbar lateral recess stenosis (Bilateral: L2-3, L4-5) (Right: L3-4) 06/03/2021   Lumbosacral foraminal stenosis (Bilateral: L2-3, L3-4) (Left: L5-S1) 06/03/2021   Lumbar nerve root impingement (Right: L3 at L2-3 & L3-4) 06/03/2021   Ligamentum flavum hypertrophy (L3-4, L4-5) 06/03/2021   Abnormal MRI, lumbar spine (04/23/2021) 04/24/2021   Long term prescription benzodiazepine use (alprazolam) (Xanax) 03/24/2021   Grade 1 Retrolisthesis of L2/L3 (5 mm) and L3/L4 (3 mm) 03/24/2021   Levoscoliosis of lumbar spine (L3-4 apex) 03/24/2021   DDD (degenerative disc disease), lumbosacral 03/24/2021   Lumbosacral facet arthropathy (Left: L3-4, L4-5, and L5-S1) 03/24/2021   Tricompartment osteoarthritis of knee (Left) 03/24/2021   Baker cyst (Left) 03/24/2021   Chronic low back pain (1ry area of Pain) (Bilateral) (R>L) w/o sciatica 03/24/2021   Lumbar facet syndrome (Bilateral) 03/24/2021   Chronic lower extremity pain (2ry area of Pain) (Bilateral) (L>R) 03/24/2021   Lumbosacral radiculitis/sensory radiculopathy at L2 (Bilateral) 03/24/2021   Lumbosacral radiculitis/sensory radiculopathy at L3 (Bilateral) 03/24/2021   Chronic pain syndrome 03/23/2021   Pharmacologic therapy 03/23/2021   Disorder of skeletal system 03/23/2021   Problems influencing health status 03/23/2021   PAD (peripheral artery disease) ([place]) 10/01/2020   GAD (generalized anxiety  disorder) 01/13/2018   MDD (major depressive disorder) 60/45/4098   Umbilical hernia 11/91/4782   Chronic knee pain (Left) 08/11/2017   Derangement of medial meniscus, posterior horn (Left) 08/11/2017   Vitamin D insufficiency 03/05/2016   BPH without obstruction/lower urinary tract symptoms 07/08/2015   Chronic fatigue 10/23/2014   Parkinson's disease ([place]) 09/20/2014   Major depression, recurrent, full remission ([place])  07/26/2013   Unsteady gait 07/26/2013   Orthostatic hypotension 07/26/2013   Depression 07/26/2013   Anxiety 06/23/2013   Chronic anxiety 06/23/2013   Tremor 05/18/2013   Bilateral carotid artery stenosis 08/30/2012    ONSET DATE: 08/11/2021  REFERRING DIAG: Parkinsons Disease  THERAPY DIAG:  Muscle weakness (generalized)  Unsteadiness on feet  Difficulty in walking, not elsewhere classified  Abnormality of gait and mobility  Rationale for Evaluation and Treatment Rehabilitation  SUBJECTIVE:                                                                                                                                                                                              SUBJECTIVE STATEMENT:   Patient reports no falls or LOB since last session.  Pt accompanied by: significant other  PERTINENT HISTORY: Per chart history provided by Dr. Consuela Mimes- Mr. Hanners has Parkinson's disease ([place]); Chronic knee pain (Left); Derangement of medial meniscus, posterior horn (Left); PAD (peripheral artery disease) ([place]); Chronic pain syndrome; Grade 1 Retrolisthesis of L2/L3 (5 mm) and L3/L4 (3 mm); Levoscoliosis of lumbar spine (L3-4 apex); DDD (degenerative disc disease), lumbosacral; Lumbosacral facet arthropathy (Left: L3-4, L4-5, and L5-S1); Tricompartment osteoarthritis of knee (Left); Baker cyst (Left); Chronic low back pain (1ry area of Pain) (Bilateral) (R>L) w/o sciatica; Lumbar facet syndrome (Bilateral); Chronic lower extremity pain (2ry area of Pain) (Bilateral) (L>R); Lumbosacral radiculitis/sensory radiculopathy at L2 (Bilateral); Lumbosacral radiculitis/sensory radiculopathy at L3 (Bilateral); Abnormal MRI, lumbar spine (04/23/2021); Lumbosacral facet hypertrophy; Lumbar central spinal stenosis, w/o neurogenic claudication (L4-5); Lumbar lateral recess stenosis (Bilateral: L2-3, L4-5) (Right: L3-4); Lumbosacral foraminal stenosis (Bilateral: L2-3, L3-4) (Left: L5-S1); Lumbar  nerve root impingement (Right: L3 at L2-3 & L3-4); Ligamentum flavum hypertrophy (L3-4, L4-5); Spondylosis without myelopathy or radiculopathy, lumbosacral region; and Cervicalgia on their pertinent problem list   Patient has appointment for Epidural to low back next Tues 08/26/2021  PAIN:  Are you having pain? Yes: NPRS scale: 3/10 Pain location: low back Pain description: ache/soreness Aggravating factors: prolonged standing, transfers, walking Relieving factors: rest and medications  PRECAUTIONS: Fall  WEIGHT BEARING RESTRICTIONS No  FALLS: Has patient fallen in last 6 months? Yes. Number of falls 2  LIVING ENVIRONMENT: Lives with: lives with their spouse  Lives in: House/apartment Stairs: 4 Has following equipment at home: [place] - 2 wheeled  PLOF: Needs assistance with ADLs- intermittent  PATIENT GOALS  to walk better and be as active as possible. Also to get in/out of cars better.   OBJECTIVE: (objective measures completed at initial evaluation unless otherwise dated)   DIAGNOSTIC FINDINGS: IMPRESSION: 1. Severe lumbar levoscoliosis with widespread advanced disc and facet degeneration. 2. Mild spinal stenosis at L4-5. 3. Moderate neural foraminal stenosis on the right at L3-4 and on the left at L5-S1. 4. Moderate right lateral recess stenosis at L2-3.      LOWER EXTREMITY MMT:    MMT (evaluation)  Right Eval Left Eval  Hip flexion 4 4  Hip extension 4 4  Hip abduction 4 4  Hip adduction 4 4  Hip internal rotation 4 4  Hip external rotation 4 4  Knee flexion 4 4  Knee extension 4 4  Ankle dorsiflexion 4 4  Ankle plantarflexion 4 4  Ankle inversion 4 4  Ankle eversion 4 4  (Blank rows = not tested)     FUNCTIONAL TESTS (EVALUATION):  5 times sit to stand: 14 sec without UE support Timed up and go (TUG): 15.67 sec with RW 10 meter walk test: 10.67 sec = 0.94 m/s using RW Berg Balance Scale: 48/56- see flowsheet for details  PATIENT SURVEYS:  FOTO  : 49 8/24: 47  TODAY'S TREATMENT:   Heat applied to patient's lower back region for all seated rest breaks as well as seated therapeutic exercises  TherEx  Seated on dynadisc: -maintain static balance x 4 min total including the below  -LAQ with cues for proper control ( attempts to move quickly) -march 10x each side     Seated abductor squeeze utilizing DynaDisc 10 times for 5-second holds  NMR: Unless otherwise stated, CGA was provided and gait belt donned in order to ensure pt safety    Standing with CGA next to support surface:   Airex pad: static stand 30 seconds x 2 trials, noticeable trembling of ankles/LE's with fatigue and challenge to maintain stability 1/2 rhomberg stance x 45 sec on ea LE on airex  Wide tandem stance with ant LE on dynadisc, instructed to work on this at home with something to hold on to anteriorly.    PATIENT EDUCATION: Education details: Pt educated throughout session about proper posture and technique with exercises. Improved exercise technique, movement at target joints, use of target muscles after min to mod verbal, visual, tactile cues.  Person educated: Patient Education method: Explanation, Demonstration, Tactile cues, and Verbal cues Education comprehension: verbalized understanding, returned demonstration, verbal cues required, tactile cues required, and needs further education   HOME EXERCISE PROGRAM: No updates on this date, pt to continue HEP as previously given: PWR! Basic 4 seated x 10 ea, handout provided: addended to not perform seated PRW! Rock exercise and added the below to program on 09/25/21  Access Code: [email or web address] URL: [email or web address] Date: 09/25/2021 Prepared by: Rivka Barbara  Exercises - Standing Romberg to 3/4 Tandem Stance  - 1 x daily - 7 x weekly - 2 sets - 30 seocnd hold - Standing March with Counter Support  - 1 x daily - 7 x weekly - 2 sets - 10 reps - Modified Single Leg Balance on Step   - 1 x daily - 7 x weekly - 2 sets - 30 second hold  Access Code: [email or web address] URL: [email or web address] Date: 10/21/2021 Prepared by: Janna Arch  Exercises - Seated  Abdominal Press into [employer]  - 1 x daily - 7 x weekly - 2 sets - 10 reps - 5 hold - Seated Flexion Stretch with Swiss Ball  - 1 x daily - 7 x weekly - 2 sets - 10 reps - 5 hold  GOALS: Goals reviewed with patient? Yes  SHORT TERM GOALS: Target date: 10/02/2021  Pt will be independent with initial HEP in order to improve strength and balance in order to decrease fall risk and improve function at home and work.  Baseline: 08/21/2021-No formal HEP in place Goal status: MET    LONG TERM GOALS: Target date: 02/05/2022  Pt will improve BERG by at least 3 points in order to demonstrate clinically significant improvement in balance.    Baseline: 08/21/2021= 48/56 8/24:47/56; 11/13/2021= 47/56 Goal status: ongoing  2.  Pt will be independent with final HEP in order to improve strength and balance in order to decrease fall risk and improve function at home and work.  Baseline: No HEP in place, 8/24: completing current HEP but still progressing; 11/13/2021- Patient continues to verbalize and demonstrate understanding of HEP for stretching/strengthening and mobility but still progressing and altering program as appropriate Goal status: IN PROGRESS  3.  Pt will decrease 5TSTS by at least 4 seconds in order to demonstrate clinically significant improvement in LE strength. Baseline: 08/21/2021= 19.01 sec without UE support 8/24:10.56 sec Goal status: MET  4.  Pt will decrease TUG to below 14 seconds/decrease in order to demonstrate decreased fall risk. Baseline:  08/21/2021=15.67 with RW 8/24: 12.41 Goal status: MET  5.  Pt will improve FOTO to target score of 56 to display perceived improvements in ability to complete ADL's.  Baseline: 08/21/2021=49 8/24: 45.6; 11/13/2021=46 Goal status: IN PROGRESS  6.  Pt will  increase 10MWT by at least 0.15 m/s in order to demonstrate clinically significant improvement in community ambulation.   Baseline: 08/22/2021=0.94 m/s 8/24:1.13ms Goal status: MET  7 Pt will improve miniBESTest by at least 4 points to demonstrate clinically significant improvement in reduced falls with parkinsonian related deficits.  Baseline: 19/28; 11/13/2021- Unable to test due to time constraint- will test next session and update goal. 9/19: 18/28  Goal Status: ONGOING  8. Pt will increase 6MWT by at least [id number] order to demonstrate clinically significant improvement in cardiopulmonary endurance and community ambulation  Baseline: 11/13/2021= 650 feet with walker Goal Status: NEW  ASSESSMENT:  CLINICAL IMPRESSION:   Continued with current plan of care as laid out in evaluation and recent prior sessions. Pt remains motivated to advance progress toward goals in order to maximize independence and safety at home. Pt requires high level assistance and cuing for completion of exercises in order to provide adequate level of stimulation and perturbation. Author allows pt as much opportunity as possible to perform independent righting strategies, only stepping in when pt is unable to prevent falling to floor. Pt closely monitored throughout session for safe vitals response and to maximize patient safety during interventions.  Patient provided with specific exercises in order to utilize DynaDisc he purchased for home exercise program.  Patient and caregiver verbalized understanding of these tasks for completion at home.  Pt continues to demonstrate progress toward goals AEB progression of some interventions this date either in volume or intensity.      OBJECTIVE IMPAIRMENTS Abnormal gait, decreased activity tolerance, decreased balance, decreased coordination, decreased endurance, decreased knowledge of use of DME, decreased mobility, difficulty walking, decreased ROM, decreased strength,  hypomobility,  and impaired flexibility.   ACTIVITY LIMITATIONS carrying, lifting, bending, standing, squatting, sleeping, stairs, transfers, continence, bathing, toileting, and dressing  PARTICIPATION LIMITATIONS: cleaning, laundry, medication management, driving, shopping, community activity, and yard work  PERSONAL FACTORS Age are also affecting patient's functional outcome.   REHAB POTENTIAL: Good  CLINICAL DECISION MAKING: Stable/uncomplicated  EVALUATION COMPLEXITY: Low  PLAN: PT FREQUENCY: 2x/week  PT DURATION: 12 weeks  PLANNED INTERVENTIONS: Therapeutic exercises, Therapeutic activity, Neuromuscular re-education, Balance training, Gait training, Patient/Family education, Joint mobilization, Stair training, Vestibular training, Canalith repositioning, DME instructions, Dry Needling, Electrical stimulation, Spinal manipulation, and Spinal mobilization  PLAN FOR NEXT SESSION: continue balance training, monitor hypotension and orthostatics, ROM for flexibility, continue plan  Particia Lather PT  12/01/21, 4:35 PM

## 2021-12-02 ENCOUNTER — Ambulatory Visit: Payer: Medicare HMO | Admitting: Physical Therapy

## 2021-12-02 ENCOUNTER — Encounter: Payer: Medicare HMO | Admitting: Speech Pathology

## 2021-12-02 ENCOUNTER — Ambulatory Visit: Payer: Medicare HMO | Admitting: Occupational Therapy

## 2021-12-04 ENCOUNTER — Ambulatory Visit: Payer: Medicare HMO | Admitting: Occupational Therapy

## 2021-12-04 ENCOUNTER — Encounter: Payer: Medicare HMO | Admitting: Speech Pathology

## 2021-12-04 ENCOUNTER — Ambulatory Visit: Payer: Medicare HMO

## 2021-12-04 DIAGNOSIS — R2681 Unsteadiness on feet: Secondary | ICD-10-CM | POA: Diagnosis not present

## 2021-12-04 DIAGNOSIS — R262 Difficulty in walking, not elsewhere classified: Secondary | ICD-10-CM | POA: Diagnosis not present

## 2021-12-04 DIAGNOSIS — R471 Dysarthria and anarthria: Secondary | ICD-10-CM | POA: Diagnosis not present

## 2021-12-04 DIAGNOSIS — R1312 Dysphagia, oropharyngeal phase: Secondary | ICD-10-CM | POA: Diagnosis not present

## 2021-12-04 DIAGNOSIS — R269 Unspecified abnormalities of gait and mobility: Secondary | ICD-10-CM | POA: Diagnosis not present

## 2021-12-04 DIAGNOSIS — M6281 Muscle weakness (generalized): Secondary | ICD-10-CM | POA: Diagnosis not present

## 2021-12-04 DIAGNOSIS — R490 Dysphonia: Secondary | ICD-10-CM | POA: Diagnosis not present

## 2021-12-04 DIAGNOSIS — R278 Other lack of coordination: Secondary | ICD-10-CM | POA: Diagnosis not present

## 2021-12-04 DIAGNOSIS — G20C Parkinsonism, unspecified: Secondary | ICD-10-CM | POA: Diagnosis not present

## 2021-12-04 DIAGNOSIS — R41841 Cognitive communication deficit: Secondary | ICD-10-CM | POA: Diagnosis not present

## 2021-12-04 DIAGNOSIS — R2689 Other abnormalities of gait and mobility: Secondary | ICD-10-CM | POA: Diagnosis not present

## 2021-12-04 NOTE — Therapy (Signed)
OUTPATIENT PHYSICAL THERAPY NEURO TREATMENT   Patient Name: Joseph Arroyo MRN: [id number] DOB:1940-08-06, 81 y.o., male Today's Date: 12/04/2021   PCP: Olin Hauser, DO REFERRING PROVIDER: Lavena Bullion, NP   PT End of Session - 12/04/21 1014     Visit Number 22    Number of Visits 7    Date for PT Re-Evaluation 02/05/22    Authorization Type [employer]    Authorization Time Period Initial Cert 6/60/6301- 08/01/930; Recert 3/55/7322-04/06/4268; PN 11/27/21    Progress Note Due on Visit 20    PT Start Time 1015    PT Stop Time 1059    PT Time Calculation (min) 44 min    Equipment Utilized During Treatment Gait belt    Activity Tolerance Patient tolerated treatment well    Behavior During Therapy WFL for tasks assessed/performed                           Past Medical History:  Diagnosis Date   Allergic rhinitis due to pollen 11/21/2007   Brachial neuritis or radiculitis NOS    Cervicalgia    Concussion    age 14 - s/p accident   Costal chondritis    Depression    Essential and other specified forms of tremor    GERD (gastroesophageal reflux disease)    in past   Lumbago    Occlusion and stenosis of carotid artery without mention of cerebral infarction    Seizures [facility])    age 13 - after concussion   Past Surgical History:  Procedure Laterality Date   CATARACT EXTRACTION Right 07/18/14   CATARACT EXTRACTION W/PHACO Left 08/01/2014   Procedure: CATARACT EXTRACTION PHACO AND INTRAOCULAR LENS PLACEMENT ([place]);  Surgeon: Leandrew Koyanagi, MD;  Location: [facility];  Service: Ophthalmology;  Laterality: Left;  IVA TOPICAL   COLONOSCOPY  2008   OTHER SURGICAL HISTORY  2002   ear surgery   STAPEDES SURGERY Right 2002   Patient Active Problem List   Diagnosis Date Noted   Dysphagia, pharyngeal phase 09/17/2021   Cervicalgia 08/05/2021   Spondylosis without myelopathy or radiculopathy, lumbosacral region 06/24/2021    Lumbosacral facet hypertrophy 06/03/2021   Lumbar central spinal stenosis, w/o neurogenic claudication (L4-5) 06/03/2021   Lumbar lateral recess stenosis (Bilateral: L2-3, L4-5) (Right: L3-4) 06/03/2021   Lumbosacral foraminal stenosis (Bilateral: L2-3, L3-4) (Left: L5-S1) 06/03/2021   Lumbar nerve root impingement (Right: L3 at L2-3 & L3-4) 06/03/2021   Ligamentum flavum hypertrophy (L3-4, L4-5) 06/03/2021   Abnormal MRI, lumbar spine (04/23/2021) 04/24/2021   Long term prescription benzodiazepine use (alprazolam) (Xanax) 03/24/2021   Grade 1 Retrolisthesis of L2/L3 (5 mm) and L3/L4 (3 mm) 03/24/2021   Levoscoliosis of lumbar spine (L3-4 apex) 03/24/2021   DDD (degenerative disc disease), lumbosacral 03/24/2021   Lumbosacral facet arthropathy (Left: L3-4, L4-5, and L5-S1) 03/24/2021   Tricompartment osteoarthritis of knee (Left) 03/24/2021   Baker cyst (Left) 03/24/2021   Chronic low back pain (1ry area of Pain) (Bilateral) (R>L) w/o sciatica 03/24/2021   Lumbar facet syndrome (Bilateral) 03/24/2021   Chronic lower extremity pain (2ry area of Pain) (Bilateral) (L>R) 03/24/2021   Lumbosacral radiculitis/sensory radiculopathy at L2 (Bilateral) 03/24/2021   Lumbosacral radiculitis/sensory radiculopathy at L3 (Bilateral) 03/24/2021   Chronic pain syndrome 03/23/2021   Pharmacologic therapy 03/23/2021   Disorder of skeletal system 03/23/2021   Problems influencing health status 03/23/2021   PAD (peripheral artery disease) ([place]) 10/01/2020   GAD (generalized anxiety  disorder) 01/13/2018   MDD (major depressive disorder) 16/12/9602   Umbilical hernia 54/10/8117   Chronic knee pain (Left) 08/11/2017   Derangement of medial meniscus, posterior horn (Left) 08/11/2017   Vitamin D insufficiency 03/05/2016   BPH without obstruction/lower urinary tract symptoms 07/08/2015   Chronic fatigue 10/23/2014   Parkinson's disease ([place]) 09/20/2014   Major depression, recurrent, full remission ([place])  07/26/2013   Unsteady gait 07/26/2013   Orthostatic hypotension 07/26/2013   Depression 07/26/2013   Anxiety 06/23/2013   Chronic anxiety 06/23/2013   Tremor 05/18/2013   Bilateral carotid artery stenosis 08/30/2012    ONSET DATE: 08/11/2021  REFERRING DIAG: Parkinsons Disease  THERAPY DIAG:  Muscle weakness (generalized)  Unsteadiness on feet  Difficulty in walking, not elsewhere classified  Abnormality of gait and mobility  Rationale for Evaluation and Treatment Rehabilitation  SUBJECTIVE:                                                                                                                                                                                              SUBJECTIVE STATEMENT:   Patient reports his back pain is bothering him today.   Pt accompanied by: significant other  PERTINENT HISTORY: Per chart history provided by Dr. Consuela Mimes- Mr. Harts has Parkinson's disease ([place]); Chronic knee pain (Left); Derangement of medial meniscus, posterior horn (Left); PAD (peripheral artery disease) ([place]); Chronic pain syndrome; Grade 1 Retrolisthesis of L2/L3 (5 mm) and L3/L4 (3 mm); Levoscoliosis of lumbar spine (L3-4 apex); DDD (degenerative disc disease), lumbosacral; Lumbosacral facet arthropathy (Left: L3-4, L4-5, and L5-S1); Tricompartment osteoarthritis of knee (Left); Baker cyst (Left); Chronic low back pain (1ry area of Pain) (Bilateral) (R>L) w/o sciatica; Lumbar facet syndrome (Bilateral); Chronic lower extremity pain (2ry area of Pain) (Bilateral) (L>R); Lumbosacral radiculitis/sensory radiculopathy at L2 (Bilateral); Lumbosacral radiculitis/sensory radiculopathy at L3 (Bilateral); Abnormal MRI, lumbar spine (04/23/2021); Lumbosacral facet hypertrophy; Lumbar central spinal stenosis, w/o neurogenic claudication (L4-5); Lumbar lateral recess stenosis (Bilateral: L2-3, L4-5) (Right: L3-4); Lumbosacral foraminal stenosis (Bilateral: L2-3, L3-4) (Left: L5-S1);  Lumbar nerve root impingement (Right: L3 at L2-3 & L3-4); Ligamentum flavum hypertrophy (L3-4, L4-5); Spondylosis without myelopathy or radiculopathy, lumbosacral region; and Cervicalgia on their pertinent problem list   Patient has appointment for Epidural to low back next Tues 08/26/2021  PAIN:  Are you having pain? Yes: NPRS scale: 6/10 Pain location: low back Pain description: ache/soreness Aggravating factors: prolonged standing, transfers, walking Relieving factors: rest and medications  PRECAUTIONS: Fall  WEIGHT BEARING RESTRICTIONS No  FALLS: Has patient fallen in last 6 months? Yes. Number of falls 2  LIVING ENVIRONMENT: Lives with: lives with their  spouse Lives in: House/apartment Stairs: 4 Has following equipment at home: [place] - 2 wheeled  PLOF: Needs assistance with ADLs- intermittent  PATIENT GOALS  to walk better and be as active as possible. Also to get in/out of cars better.   OBJECTIVE: (objective measures completed at initial evaluation unless otherwise dated)   DIAGNOSTIC FINDINGS: IMPRESSION: 1. Severe lumbar levoscoliosis with widespread advanced disc and facet degeneration. 2. Mild spinal stenosis at L4-5. 3. Moderate neural foraminal stenosis on the right at L3-4 and on the left at L5-S1. 4. Moderate right lateral recess stenosis at L2-3.      LOWER EXTREMITY MMT:    MMT (evaluation)  Right Eval Left Eval  Hip flexion 4 4  Hip extension 4 4  Hip abduction 4 4  Hip adduction 4 4  Hip internal rotation 4 4  Hip external rotation 4 4  Knee flexion 4 4  Knee extension 4 4  Ankle dorsiflexion 4 4  Ankle plantarflexion 4 4  Ankle inversion 4 4  Ankle eversion 4 4  (Blank rows = not tested)     FUNCTIONAL TESTS (EVALUATION):  5 times sit to stand: 14 sec without UE support Timed up and go (TUG): 15.67 sec with RW 10 meter walk test: 10.67 sec = 0.94 m/s using RW Berg Balance Scale: 48/56- see flowsheet for details  PATIENT SURVEYS:   FOTO : 49 8/24: 47  TODAY'S TREATMENT:   Heat applied to patient's lower back region for all seated rest breaks as well as seated therapeutic exercises  TherEx Standing marches 20x each LE Standing lateral lunges 12x each LE  Standing hamstring lengthening 60 seconds on stairs Seated hamstring lengthening 60 seconds   seated lateral step over and back 10x each side  GTB alternating LAQ 12x each LE  GTB abduction 15x each LE  NMR: Unless otherwise stated, CGA was provided and gait belt donned in order to ensure pt safety    Standing with CGA next to support surface:   Airex pad: static stand 30 seconds x 2 trials, noticeable trembling of ankles/LE's with fatigue and challenge to maintain stability  6" step coordination taps seated 30 seconds x 2 trials  Opposite UE/LE raises seated 15x each LE   PATIENT EDUCATION: Education details: Pt educated throughout session about proper posture and technique with exercises. Improved exercise technique, movement at target joints, use of target muscles after min to mod verbal, visual, tactile cues.  Person educated: Patient Education method: Explanation, Demonstration, Tactile cues, and Verbal cues Education comprehension: verbalized understanding, returned demonstration, verbal cues required, tactile cues required, and needs further education   HOME EXERCISE PROGRAM: No updates on this date, pt to continue HEP as previously given: PWR! Basic 4 seated x 10 ea, handout provided: addended to not perform seated PRW! Rock exercise and added the below to program on 09/25/21  Access Code: [email or web address] URL: [email or web address] Date: 09/25/2021 Prepared by: Rivka Barbara  Exercises - Standing Romberg to 3/4 Tandem Stance  - 1 x daily - 7 x weekly - 2 sets - 30 seocnd hold - Standing March with Counter Support  - 1 x daily - 7 x weekly - 2 sets - 10 reps - Modified Single Leg Balance on Step  - 1 x daily - 7 x weekly - 2 sets -  30 second hold  Access Code: [email or web address] URL: [email or web address] Date: 10/21/2021 Prepared by: Janna Arch  Exercises - Seated Abdominal Press into [employer]  - 1 x daily -  7 x weekly - 2 sets - 10 reps - 5 hold - Seated Flexion Stretch with Swiss Ball  - 1 x daily - 7 x weekly - 2 sets - 10 reps - 5 hold  Access Code: [email or web address] URL: [email or web address] Date: 12/04/2021 Prepared by: Janna Arch  Exercises - Seated Hamstring Stretch  - 1 x daily - 7 x weekly - 2 sets - 2 reps - 30 hold - Standing Hamstring Stretch with Step  - 1 x daily - 7 x weekly - 2 sets - 2 reps - 30 hold  GOALS: Goals reviewed with patient? Yes  SHORT TERM GOALS: Target date: 10/02/2021  Pt will be independent with initial HEP in order to improve strength and balance in order to decrease fall risk and improve function at home and work.  Baseline: 08/21/2021-No formal HEP in place Goal status: MET    LONG TERM GOALS: Target date: 02/05/2022  Pt will improve BERG by at least 3 points in order to demonstrate clinically significant improvement in balance.    Baseline: 08/21/2021= 48/56 8/24:47/56; 11/13/2021= 47/56 Goal status: ongoing  2.  Pt will be independent with final HEP in order to improve strength and balance in order to decrease fall risk and improve function at home and work.  Baseline: No HEP in place, 8/24: completing current HEP but still progressing; 11/13/2021- Patient continues to verbalize and demonstrate understanding of HEP for stretching/strengthening and mobility but still progressing and altering program as appropriate Goal status: IN PROGRESS  3.  Pt will decrease 5TSTS by at least 4 seconds in order to demonstrate clinically significant improvement in LE strength. Baseline: 08/21/2021= 19.01 sec without UE support 8/24:10.56 sec Goal status: MET  4.  Pt will decrease TUG to below 14 seconds/decrease in order to demonstrate decreased fall risk. Baseline:   08/21/2021=15.67 with RW 8/24: 12.41 Goal status: MET  5.  Pt will improve FOTO to target score of 56 to display perceived improvements in ability to complete ADL's.  Baseline: 08/21/2021=49 8/24: 45.6; 11/13/2021=46 Goal status: IN PROGRESS  6.  Pt will increase 10MWT by at least 0.15 m/s in order to demonstrate clinically significant improvement in community ambulation.   Baseline: 08/22/2021=0.94 m/s 8/24:1.54ms Goal status: MET  7 Pt will improve miniBESTest by at least 4 points to demonstrate clinically significant improvement in reduced falls with parkinsonian related deficits.  Baseline: 19/28; 11/13/2021- Unable to test due to time constraint- will test next session and update goal. 9/19: 18/28  Goal Status: ONGOING  8. Pt will increase 6MWT by at least [id number] order to demonstrate clinically significant improvement in cardiopulmonary endurance and community ambulation  Baseline: 11/13/2021= 650 feet with walker Goal Status: NEW  ASSESSMENT:  CLINICAL IMPRESSION:  Patient presents with excellent motivation. His back pain is limiting his session requiring use of heat throughout session on low back. Education on hamstring stretching tolerated  and added to HEP. Patient is very challenged with cross body coordination; encouraged to practice at home.  Pt continues to demonstrate progress toward goals AEB progression of some interventions this date either in volume or intensity.      OBJECTIVE IMPAIRMENTS Abnormal gait, decreased activity tolerance, decreased balance, decreased coordination, decreased endurance, decreased knowledge of use of DME, decreased mobility, difficulty walking, decreased ROM, decreased strength, hypomobility, and impaired flexibility.   ACTIVITY LIMITATIONS carrying, lifting, bending, standing, squatting, sleeping, stairs, transfers, continence, bathing, toileting, and dressing  PARTICIPATION LIMITATIONS: cleaning, laundry, medication management, driving,  shopping, community activity, and  yard work  PERSONAL FACTORS Age are also affecting patient's functional outcome.   REHAB POTENTIAL: Good  CLINICAL DECISION MAKING: Stable/uncomplicated  EVALUATION COMPLEXITY: Low  PLAN: PT FREQUENCY: 2x/week  PT DURATION: 12 weeks  PLANNED INTERVENTIONS: Therapeutic exercises, Therapeutic activity, Neuromuscular re-education, Balance training, Gait training, Patient/Family education, Joint mobilization, Stair training, Vestibular training, Canalith repositioning, DME instructions, Dry Needling, Electrical stimulation, Spinal manipulation, and Spinal mobilization  PLAN FOR NEXT SESSION: continue balance training, monitor hypotension and orthostatics, ROM for flexibility, continue plan  Janna Arch PT  12/04/21, 10:59 AM

## 2021-12-07 NOTE — Progress Notes (Addendum)
PROVIDER NOTE: Information contained herein reflects review and annotations entered in association with encounter. Interpretation of such information and data should be left to medically-trained personnel. Information provided to patient can be located elsewhere in the medical record under "Patient Instructions". Document created using STT-dictation technology, any transcriptional errors that may result from process are unintentional.    Patient: Joseph Arroyo  Service Category: E/M  Provider: Gaspar Cola, MD  DOB: [birth year]  DOS: 12/10/2021  Referring Provider: Nobie Putnam *  MRN: [id number]  Specialty: Interventional Pain Management  PCP: Olin Hauser, DO  Type: Established Patient  Setting: Ambulatory outpatient    Location: Office  Delivery: Face-to-face     HPI  [name], a 81 y.o. year old male, is here today because of his Abnormal MRI, lumbar spine [R93.7]. [name]'s primary complain today is Back Pain (low) Last encounter: My last encounter with him was on 09/11/2021. Pertinent problems: Mr. Ashby has Parkinson's disease ([place]); Chronic knee pain (Left); Derangement of medial meniscus, posterior horn (Left); PAD (peripheral artery disease) ([place]); Chronic pain syndrome; Grade 1 Retrolisthesis of L2/L3 (5 mm) and L3/L4 (3 mm); Levoscoliosis of lumbar spine (L3-4 apex); DDD (degenerative disc disease), lumbosacral; Lumbosacral facet arthropathy (Left: L3-4, L4-5, and L5-S1); Tricompartment osteoarthritis of knee (Left); Baker cyst (Left); Chronic low back pain (1ry area of Pain) (Bilateral) (R>L) w/o sciatica; Lumbar facet syndrome (Bilateral); Chronic lower extremity pain (2ry area of Pain) (Bilateral) (L>R); Lumbosacral radiculitis/sensory radiculopathy at L2 (Bilateral); Lumbosacral radiculitis/sensory radiculopathy at L3 (Bilateral); Abnormal MRI, lumbar spine (04/23/2021); Lumbosacral facet hypertrophy; Lumbar central spinal stenosis,  w/o neurogenic claudication (L4-5); Lumbar lateral recess stenosis (Bilateral: L2-3, L4-5) (Right: L3-4); Lumbosacral foraminal stenosis (Bilateral: L2-3, L3-4) (Left: L5-S1); Lumbar nerve root impingement (Right: L3 at L2-3 & L3-4); Ligamentum flavum hypertrophy (L3-4, L4-5); Spondylosis without myelopathy or radiculopathy, lumbosacral region; and Cervicalgia on their pertinent problem list. Pain Assessment: Severity of Chronic pain is reported as a 9 /10. Location: Back Lower/denies. Onset: More than a month ago. Quality: Sharp. Timing: Constant. Modifying factor(s): heating pad. Vitals:  height is '5\' 8"'  (1.727 m) and weight is 131 lb (59.4 kg). His temperature is 97.1 F (36.2 C) (abnormal). His blood pressure is 128/75 and his pulse is 54 (abnormal). His respiration is 16 and oxygen saturation is 98%.   Reason for encounter: follow-up evaluation.  The patient is interested in learning what type of alternatives he has to the denial of his order for diagnostic lumbar facets.  This is a problem when the insurance company keeps denying required treatment.  They are going [place] and making medical decisions against the treating physician without personally evaluating the patient or having a medical license, which is illegal in the state of [place].  This is a patient with Parkinson's disease, on a wheelchair, who has a clinically complex thoracolumbar problem which will require different approaches at different times depending on the clinical signs and symptoms that he is presenting with at that time.  (04/23/2021) LUMBAR MRI FINDINGS: Alignment: Severe lumbar levoscoliosis. 5 mm retrolisthesis of L2 on L3 and 3 mm retrolisthesis of L3 on L4. Vertebrae: Prominent degenerative endplate changes at F6-2 and L3-4 including moderate degenerative edema. Mild left-sided degenerative endplate edema at Z3-Y8. Hemangiomas in the T11 and L1 vertebral bodies.  DISC LEVELS: Disc desiccation and  disc space narrowing throughout the lumbar spine including asymmetrically severe right-sided disc space narrowing at L2-3 and L3-4 and left-sided narrowing at L5-S1. L1-2:  Mild disc bulging and mild facet hypertrophy without stenosis. L2-3: Retrolisthesis with right eccentric bulging of uncovered disc and moderate facet hypertrophy result in moderate right and mild left lateral recess stenosis and mild bilateral neural foraminal stenosis without significant spinal stenosis. Potential right L3 nerve root impingement. L3-4: Retrolisthesis with right eccentric bulging of uncovered disc and severe facet and ligamentum flavum hypertrophy result in mild right lateral recess stenosis and moderate right and mild left neural foraminal stenosis with potential right L3 nerve root impingement. No spinal stenosis. L4-5: Disc bulging and severe facet and ligamentum flavum hypertrophy result in mild spinal stenosis, mild left greater than right lateral recess stenosis, and no significant neural foraminal stenosis. L5-S1: Left eccentric disc bulging, endplate spurring, asymmetric left-sided disc space height loss, and moderate to severe left facet hypertrophy result in moderate left neural foraminal stenosis without spinal stenosis.  IMPRESSION: 1. Severe lumbar levoscoliosis with widespread advanced disc and facet degeneration. 2. Mild spinal stenosis at L4-5. 3. Moderate neural foraminal stenosis on the right at L3-4 and on the left at L5-S1. 4. Moderate right lateral recess stenosis at L2-3.  Today we have spent a considerable amount trying to understand the reasons why the insurance company is denying therapy that it is absolutely necessary for this patient.  Today the patient comes in indicating that he is having bilateral low back pain with no pain being radiated to the flanks, or the anterior portion of his body.  He has no pain in the groin region or anterior thighs.  He is not having any type of lower  extremity weakness except for that caused by his Parkinson's disease.  Physical exam today is positive for bilateral lumbar facet arthralgia, likely to be secondary to the L2-3 and L3-4 retrolisthesis and the stress that this puts on the facet joints.  Despite the fact that the MRI would suggest the patient to have significant foraminal stenosis and possible nerve impingement, clinically the patient is displaying absolutely no signs or symptoms of either an upper or lower radiculopathy or radiculitis at any level, on either side.  The MRI does confirm that the patient has multilevel facet hypertrophy and arthropathy, likely to be responsible for the pain that he is experiencing at this time.  He has already complied with all request of trying physical therapy, over-the-counter medications, and or other therapy is typically used for this condition.  At this point, based on today's history and physical exam I remain convinced that the patient has a bilateral lumbar facet syndrome affecting multiple levels.  Continued efforts by his insurance company to block this procedures from being done is simply prolonging the patient's agony and it is absolutely wrong.  For this reason again I am documenting here that as a board-certified pain management specialist, I am certifying that this patient requires a second diagnostic bilateral lumbar facet block for the purpose of determining if he would be a good candidate for radiofrequency ablation so as to provide him with longer lasting relief.  He does have multiple problems with his lumbar spine which in the future may require further treatment including with epidural steroid injections, but at this point in time, he is not having any radicular symptoms that would warrant or indicate that use of an epidural steroid for its treatment.  This may change in the future, but for the time being it is my opinion that what he needs is a bilateral lumbar facet MBB to block the L2-3, L3-4,  L4-5, and L5-S1  facet joints.  This can probably be accomplished by blocking the L2-3 and L3-4 facet joints first and later doing the L4-5 and L5-S1 levels.  However, because he has involvement of all of these levels, requesting that further approvals be dependent on the patient getting complete long-term relief with a partial blockade of the problem simply demonstrates ignorance on the part of those reviewing the chart.  Pharmacotherapy Assessment  Analgesic: None MME/day: 0 mg/day   Monitoring: [place] PMP: PDMP reviewed during this encounter.       Pharmacotherapy: No side-effects or adverse reactions reported. Compliance: No problems identified. Effectiveness: Clinically acceptable.  Dewayne Shorter, RN  12/10/2021  1:27 PM  Sign when Signing Visit Safety precautions to be maintained throughout the outpatient stay will include: orient to surroundings, keep bed in low position, maintain call bell within reach at all times, provide assistance with transfer out of bed and ambulation.    No results found for: "CBDTHCR" No results found for: "D8THCCBX" No results found for: "D9THCCBX"  UDS:  Summary  Date Value Ref Range Status  03/25/2021 Note  Final    Comment:    ==================================================================== Compliance Drug Analysis, Ur ==================================================================== Test                             Result       Flag       Units  Drug Present and Declared for Prescription Verification   Gabapentin                     PRESENT      EXPECTED   Baclofen                       PRESENT      EXPECTED   Paroxetine                     PRESENT      EXPECTED   Ibuprofen                      PRESENT      EXPECTED  Drug Present not Declared for Prescription Verification   Acetaminophen                  PRESENT      UNEXPECTED  Drug Absent but Declared for Prescription Verification   Alprazolam                     Not Detected UNEXPECTED  ng/mg creat ==================================================================== Test                      Result    Flag   Units      Ref Range   Creatinine              122              mg/dL      >=20 ==================================================================== Declared Medications:  The flagging and interpretation on this report are based on the  following declared medications.  Unexpected results may arise from  inaccuracies in the declared medications.   **Note: The testing scope of this panel includes these medications:   Alprazolam  Baclofen (Lioresal)  Gabapentin (Neurontin)  Paroxetine (Paxil)   **Note: The testing scope of this panel does not include small to  moderate amounts of these reported  medications:   Ibuprofen (Advil)   **Note: The testing scope of this panel does not include the  following reported medications:   Betamethasone (Lotrisone)  Carbidopa (Sinemet)  Clotrimazole (Lotrisone)  Folic Acid  Levodopa (Sinemet)  Lutein  Magnesium  Omega-3 Fatty Acids  Pramipexole (Mirapex)  Supplement  Ubiquinone (CoQ10)  Vitamin B ==================================================================== For clinical consultation, please call [phone]. ====================================================================       ROS  Constitutional: Denies any fever or chills Gastrointestinal: No reported hemesis, hematochezia, vomiting, or acute GI distress Musculoskeletal: Denies any acute onset joint swelling, redness, loss of ROM, or weakness Neurological: No reported episodes of acute onset apraxia, aphasia, dysarthria, agnosia, amnesia, paralysis, loss of coordination, or loss of consciousness  Medication Review  ALPRAZolam, B Complex Vitamins, Capsicum-Garlic, [place], L-Methylfolate, Lutein, PARoxetine, Psyllium, Saw Palmetto (Serenoa repens), acetaminophen, baclofen, carbidopa-levodopa, cloNIDine, gabapentin, and pramipexole  History  Review  Allergy: [name] is allergic to prednisone and wheat bran. Drug: [name]  reports no history of drug use. Alcohol:  reports no history of alcohol use. Tobacco:  reports that he has never smoked. He has never used smokeless tobacco. Social: [name]  reports that he has never smoked. He has never used smokeless tobacco. He reports that he does not drink alcohol and does not use drugs. Medical:  has a past medical history of Allergic rhinitis due to pollen (11/21/2007), Brachial neuritis or radiculitis NOS, Cervicalgia, Concussion, Costal chondritis, Depression, Essential and other specified forms of tremor, GERD (gastroesophageal reflux disease), Lumbago, Occlusion and stenosis of carotid artery without mention of cerebral infarction, and Seizures ([place]). Surgical: [name]  has a past surgical history that includes Other surgical history (2002); Colonoscopy (2008); Stapedes surgery (Right, 2002); Cataract extraction (Right, 07/18/14); and Cataract extraction w/PHACO (Left, 08/01/2014). Family: family history includes Asthma in his father; Heart failure in his mother.  Laboratory Chemistry Profile   Renal Lab Results  Component Value Date   BUN 20 10/10/2021   CREATININE 0.77 10/10/2021   BCR SEE NOTE: 10/10/2021   GFRAA 100 09/07/2019   GFRNONAA >60 03/24/2021    Hepatic Lab Results  Component Value Date   AST 16 10/10/2021   ALT 6 (L) 10/10/2021   ALBUMIN 4.1 03/24/2021   ALKPHOS 51 03/24/2021    Electrolytes Lab Results  Component Value Date   NA 133 (L) 10/10/2021   K 4.2 10/10/2021   CL 97 (L) 10/10/2021   CALCIUM 9.0 10/10/2021   MG 2.0 03/24/2021    Bone Lab Results  Component Value Date   VD25OH 42 10/10/2021   25OHVITD1 32 03/24/2021   25OHVITD2 <1.0 03/24/2021   25OHVITD3 31 03/24/2021    Inflammation (CRP: Acute Phase) (ESR: Chronic Phase) Lab Results  Component Value Date   CRP 0.5 03/24/2021   ESRSEDRATE 4 03/24/2021          Note: Above Lab results reviewed.  Recent Imaging Review  DG SWALLOW FUNC OP MEDICARE SPEECH PATH Table formatting from the original result was not included. Images from the original result were not included. Objective Swallowing Evaluation: Type of Study: MBS-Modified Barium  Swallow Study   Patient Details  Name: HESTON WIDENER MRN: [id number] Date of Birth: 26-Mar-1940  Today's Date: 09/17/2021 Time: SLP Start Time (ACUTE ONLY): 4665 -SLP Stop Time (ACUTE ONLY): 1315  SLP Time Calculation (min) (ACUTE ONLY): 30 min  Past Medical History:  Past Medical History:  Diagnosis Date   Allergic rhinitis due to pollen 11/21/2007   Brachial neuritis or  radiculitis NOS    Cervicalgia    Concussion    age 53 - s/p accident   Costal chondritis    Depression    Essential and other specified forms of tremor    GERD (gastroesophageal reflux disease)    in past   Lumbago    Occlusion and stenosis of carotid artery without mention of cerebral  infarction    Seizures [facility])    age 87 - after concussion   Past Surgical History:  Past Surgical History:  Procedure Laterality Date   CATARACT EXTRACTION Right 07/18/14   CATARACT EXTRACTION W/PHACO Left 08/01/2014   Procedure: CATARACT EXTRACTION PHACO AND INTRAOCULAR LENS PLACEMENT  ([facility]);  Surgeon: Leandrew Koyanagi, MD;  Location: [facility];   Service: Ophthalmology;  Laterality: Left;  IVA TOPICAL   COLONOSCOPY  2008   OTHER SURGICAL HISTORY  2002   ear surgery   STAPEDES SURGERY Right 2002   HPI: Moderate bilateral Parkinsonism with FOG, anxiety, Scoliosis, Severe  and chronic back pain   Subjective: pt pleasant, known to this writer   Recommendations for follow up therapy are one component of a  multi-disciplinary discharge planning process, led by the attending  physician.  Recommendations may be updated based on patient status,  additional functional criteria and insurance authorization.  Assessment /  Plan / Recommendation    09/17/2021    3:00 PM  Clinical Impressions  Clinical Impression Pt presents with moderate sensorineural oropharyngeal  dysphagia that increases pt's aspiration risk when consuming PO intake  when consuming puree, graham cracker, thin liquids via cup and nectar  thick liquids via cup. Pt's oral phase is disorganized with rocking of  bolus anterior and posteriorly with occasional oral and base of tongue  residue. Pt's pharyngeal phase is c/b delayed swallow resulting in  penetration during the swallow, reduced hyoid movement resulting in  incomplete epiglottic closure and results pharyngeal peristalsis yielding  increased pharyngeal residue as consumption continues. Specifically,  penetrates begin to accumulate and result in 1 instance of penetration to  the cords. Cued cough was successful in clearing these penetrates. Silent  aspiration x 1 was observed when consuming consecutive cup sips. The  barium tablet had difficulty clearing the pharynx d/t reduced pharyngeal  peristalsis. Liquid wash was helpful in clearing tablet. At this time,  recommend continuing regular diet with thin liquids via single cup sips,  medicine whole with puree.  SLP Visit Diagnosis Dysphagia, oropharyngeal phase (R13.12)  Impact on safety and function Mild aspiration risk         09/17/2021    3:00 PM  Treatment Recommendations  Treatment Recommendations No treatment recommended at this time         No data to display           09/17/2021    3:00 PM  Diet Recommendations  SLP Diet Recommendations Regular solids;Thin liquid  Liquid Administration via Cup  Medication Administration Whole meds with puree  Compensations Minimize environmental distractions;Slow rate;Small  sips/bites  Postural Changes Remain semi-upright after after feeds/meals  (Comment);Seated upright at 90 degrees        09/17/2021    3:00 PM  Other Recommendations  Oral Care Recommendations Oral care  BID  Follow Up Recommendations No SLP follow up       No data to display              09/17/2021    3:00 PM  Oral Phase  Oral Phase Impaired  Oral -  Nectar Teaspoon Weak lingual manipulation;Incomplete tongue to  palate contact;Reduced posterior propulsion;Lingual/palatal  residue;Piecemeal swallowing;Delayed oral transit;Decreased bolus  cohesion;Premature spillage  Oral - Nectar Cup Weak lingual manipulation;Incomplete tongue to palate  contact;Reduced posterior propulsion;Lingual/palatal residue;Piecemeal  swallowing;Delayed oral transit;Decreased bolus cohesion;Premature  spillage  Oral - Thin Teaspoon Weak lingual manipulation;Incomplete tongue to palate  contact;Reduced posterior propulsion;Lingual/palatal residue;Piecemeal  swallowing;Decreased bolus cohesion;Premature spillage  Oral - Thin Cup Weak lingual manipulation;Lingual/palatal  residue;Piecemeal swallowing;Delayed oral transit;Decreased bolus  cohesion;Premature spillage  Oral - Puree Weak lingual manipulation;Incomplete tongue to palate  contact;Reduced posterior propulsion;Lingual/palatal residue;Piecemeal  swallowing;Delayed oral transit;Decreased bolus cohesion;Premature  spillage  Oral - Mech Soft Weak lingual manipulation;Incomplete tongue to palate  contact;Reduced posterior propulsion;Lingual/palatal residue;Piecemeal  swallowing;Delayed oral transit;Decreased bolus cohesion;Premature  spillage  Oral - Pill Weak lingual manipulation;Incomplete tongue to palate  contact;Reduced posterior propulsion;Holding of bolus;Lingual/palatal  residue;Piecemeal swallowing;Delayed oral transit;Decreased bolus  cohesion;Premature spillage       09/17/2021    3:00 PM  Pharyngeal Phase  Pharyngeal Phase Impaired  Pharyngeal- Nectar Teaspoon Delayed swallow initiation-pyriform  sinuses;Reduced pharyngeal peristalsis;Reduced epiglottic  inversion;Reduced anterior laryngeal mobility;Reduced airway/laryngeal   closure;Reduced tongue base retraction;Pharyngeal residue -  valleculae;Pharyngeal residue - pyriform;Penetration/Aspiration during  swallow  Pharyngeal Material enters airway, remains ABOVE vocal cords and not  ejected out  Pharyngeal- Nectar Cup Delayed swallow initiation-pyriform sinuses;Reduced  pharyngeal peristalsis;Reduced epiglottic inversion;Reduced anterior  laryngeal mobility;Reduced airway/laryngeal closure;Reduced tongue base  retraction;Pharyngeal residue - valleculae;Pharyngeal residue -  pyriform;Penetration/Aspiration during swallow  Pharyngeal Material enters airway, remains ABOVE vocal cords and not  ejected out  Pharyngeal- Thin Teaspoon Delayed swallow initiation-pyriform  sinuses;Reduced pharyngeal peristalsis;Reduced epiglottic  inversion;Reduced anterior laryngeal mobility;Reduced airway/laryngeal  closure;Reduced tongue base retraction;Penetration/Aspiration during  swallow;Pharyngeal residue - valleculae;Pharyngeal residue - pyriform  Pharyngeal Material enters airway, remains ABOVE vocal cords and not  ejected out  Pharyngeal- Thin Cup Delayed swallow initiation-pyriform sinuses;Reduced  pharyngeal peristalsis;Reduced epiglottic inversion;Reduced anterior  laryngeal mobility;Reduced airway/laryngeal closure;Reduced tongue base  retraction;Penetration/Aspiration during swallow;Pharyngeal residue -  valleculae;Pharyngeal residue - pyriform;Pharyngeal residue - posterior  pharnyx  Pharyngeal Material enters airway, CONTACTS cords and not ejected  out;Material enters airway, CONTACTS cords and then ejected out  Pharyngeal- Puree Delayed swallow initiation-pyriform sinuses;Delayed  swallow initiation-vallecula;Reduced pharyngeal peristalsis;Reduced  epiglottic inversion;Reduced anterior laryngeal mobility;Reduced  airway/laryngeal closure;Reduced tongue base retraction;Pharyngeal residue  - valleculae;Pharyngeal residue - pyriform;Pharyngeal residue - posterior   pharnyx  Pharyngeal- Mechanical Soft Delayed swallow initiation-pyriform  sinuses;Reduced pharyngeal peristalsis;Reduced epiglottic  inversion;Reduced anterior laryngeal mobility;Reduced airway/laryngeal  closure;Reduced tongue base retraction;Pharyngeal residue -  valleculae;Pharyngeal residue - pyriform;Pharyngeal residue - posterior  pharnyx  Pharyngeal- Pill Delayed swallow initiation-pyriform sinuses;Reduced  pharyngeal peristalsis;Reduced epiglottic inversion;Reduced anterior  laryngeal mobility;Reduced airway/laryngeal closure;Reduced tongue base  retraction;Pharyngeal residue - valleculae;Pharyngeal residue - pyriform       09/17/2021    3:00 PM  Cervical Esophageal Phase   Cervical Esophageal Phase [facility]   Happi Overton 09/17/2021, 4:06 PM                                    CLINICAL DATA:  Dysphagia. Cough/GE reflux disease/other secondary diagnosis  EXAM: MODIFIED BARIUM SWALLOW  TECHNIQUE: Different consistencies of barium were administered orally to the patient by the Speech Pathologist. Imaging of the pharynx was performed in the lateral projection. The radiologist was present in the fluoroscopy room for this study, providing personal supervision.  FLUOROSCOPY: Radiation Exposure Index (as provided by the fluoroscopic device):  17.5 mGy  COMPARISON:  None Available.  FINDINGS: Vestibular Penetration: Laryngeal penetration with thin and nectar consistency.  Aspiration:  None seen.  Other:  None.  IMPRESSION: Modified barium swallow as described above.  Please refer to the Speech Pathologists report for complete details and recommendations.  Electronically Signed   By: Kathreen Devoid M.D.   On: 09/15/2021 08:30 Note: Reviewed        Physical Exam  General appearance: Well nourished, well developed, and well hydrated. In no apparent acute distress Mental status: Alert, oriented x 3 (person, place, & time)       Respiratory: No evidence  of acute respiratory distress Eyes: PERLA Vitals: BP 128/75   Pulse (!) 54   Temp (!) 97.1 F (36.2 C)   Resp 16   Ht '5\' 8"'  (1.727 m)   Wt 131 lb (59.4 kg)   SpO2 98%   BMI 19.92 kg/m  BMI: Estimated body mass index is 19.92 kg/m as calculated from the following:   Height as of this encounter: '5\' 8"'  (1.727 m).   Weight as of this encounter: 131 lb (59.4 kg). Ideal: Ideal body weight: 68.4 kg (150 lb 12.7 oz)  Assessment   Diagnosis Status  1. Abnormal MRI, lumbar spine (04/23/2021)   2. Chronic low back pain (1ry area of Pain) (Bilateral) (R>L) w/o sciatica   3. Grade 1 Retrolisthesis of L2/L3 (5 mm) and L3/L4 (3 mm)   4. Lumbosacral facet arthropathy (Left: L3-4, L4-5, and L5-S1)   5. Lumbosacral facet hypertrophy   6. Lumbar facet syndrome (Bilateral)    Controlled Controlled Controlled   Updated Problems: No problems updated.   Plan of Care  Problem-specific:  No problem-specific Assessment & Plan notes found for this encounter.  [name] has a current medication list which includes the following long-term medication(s): clonidine, gabapentin, paroxetine, and paroxetine.  Pharmacotherapy (Medications Ordered): No orders of the defined types were placed in this encounter.  Orders:  Orders Placed This Encounter  Procedures   LUMBAR FACET(MEDIAL BRANCH NERVE BLOCK) MBNB    Standing Status:   Future    Standing Expiration Date:   03/12/2022    Scheduling Instructions:     Procedure: Lumbar facet block (AKA.: Lumbosacral medial branch nerve block)     Side: Bilateral     Level: L2-3 & L3-4 Facets (L1, L2, L3, & L4 Medial Branch Nerves)     Sedation: Patient's choice.     Timeframe: ASAA    Order Specific Question:   Where will this procedure be performed?    Answer:   [facility]   Follow-up plan:   Return for procedure (Clinic): (B) L2-3 & L3-4 Facets (L1, L2, L3, & L4 Medial Branch Nerves) Blk.     Interventional Therapies  Risk   Complexity Considerations:   Estimated body mass index is 20.98 kg/m as calculated from the following:   Height as of this encounter: '5\' 8"'  (1.727 m).   Weight as of this encounter: 138 lb (62.6 kg). Parkinson's  SENSITIVITY: Oral Prednisone (Hyperactivity)   Planned  Pending:   Diagnostic bilateral L2-3 & L3-4 Facet Blk (L1, L2, L3, & L4 MBB) #1    Under consideration:   Diagnostic bilateral L2-3 & L3-4 lumbar facet MBB #1  Diagnostic x-rays of the cervical spine for further evaluation of his cervicalgia.   Completed:   Diagnostic bilateral lumbar facet (L2-S1) MBB x1 (07/08/2021) (50/50/50/<50)  Diagnostic right L1-2 LESI x1 (08/26/2021) (100/100/50/50)  Diagnostic right L2-3 LESI x1 (06/03/2021) (100/100/45/45)  Referral to physical therapy for LB PT as well as a back brace eval. (04/28/2021) (water aerobics)    Therapeutic  Palliative (PRN) options:   None established    Recent Visits Date Type Provider Dept  09/11/21 Office Visit Milinda Pointer, MD [facility]  Showing recent visits within past 90 days and meeting all other requirements Today's Visits Date Type Provider Dept  12/10/21 Office Visit Milinda Pointer, MD [facility]  Showing today's visits and meeting all other requirements Future Appointments No visits were found meeting these conditions. Showing future appointments within next 90 days and meeting all other requirements  I discussed the assessment and treatment plan with the patient. The patient was provided an opportunity to ask questions and all were answered. The patient agreed with the plan and demonstrated an understanding of the instructions.  Patient advised to call back or seek an in-person evaluation if the symptoms or condition worsens.  Duration of encounter: 30 minutes.  Total time on encounter, as per AMA guidelines included both the face-to-face and non-face-to-face time personally spent by the physician and/or other  qualified health care professional(s) on the day of the encounter (includes time in activities that require the physician or other qualified health care professional and does not include time in activities normally performed by clinical staff). Physician's time may include the following activities when performed: preparing to see the patient (eg, review of tests, pre-charting review of records) obtaining and/or reviewing separately obtained history performing a medically appropriate examination and/or evaluation counseling and educating the patient/family/caregiver ordering medications, tests, or procedures referring and communicating with other health care professionals (when not separately reported) documenting clinical information in the electronic or other health record independently interpreting results (not separately reported) and communicating results to the patient/ family/caregiver care coordination (not separately reported)  Note by: Gaspar Cola, MD Date: 12/10/2021; Time: 2:17 PM

## 2021-12-09 ENCOUNTER — Encounter: Payer: Medicare HMO | Admitting: Speech Pathology

## 2021-12-09 ENCOUNTER — Ambulatory Visit: Payer: Medicare HMO

## 2021-12-09 ENCOUNTER — Encounter: Payer: Medicare HMO | Admitting: Occupational Therapy

## 2021-12-09 DIAGNOSIS — R41841 Cognitive communication deficit: Secondary | ICD-10-CM

## 2021-12-09 DIAGNOSIS — G20C Parkinsonism, unspecified: Secondary | ICD-10-CM

## 2021-12-09 DIAGNOSIS — R2681 Unsteadiness on feet: Secondary | ICD-10-CM | POA: Diagnosis not present

## 2021-12-09 DIAGNOSIS — R2689 Other abnormalities of gait and mobility: Secondary | ICD-10-CM

## 2021-12-09 DIAGNOSIS — R471 Dysarthria and anarthria: Secondary | ICD-10-CM

## 2021-12-09 DIAGNOSIS — R1312 Dysphagia, oropharyngeal phase: Secondary | ICD-10-CM

## 2021-12-09 DIAGNOSIS — M6281 Muscle weakness (generalized): Secondary | ICD-10-CM

## 2021-12-09 DIAGNOSIS — R278 Other lack of coordination: Secondary | ICD-10-CM | POA: Diagnosis not present

## 2021-12-09 DIAGNOSIS — R262 Difficulty in walking, not elsewhere classified: Secondary | ICD-10-CM

## 2021-12-09 DIAGNOSIS — R490 Dysphonia: Secondary | ICD-10-CM | POA: Diagnosis not present

## 2021-12-09 DIAGNOSIS — R269 Unspecified abnormalities of gait and mobility: Secondary | ICD-10-CM | POA: Diagnosis not present

## 2021-12-09 NOTE — Therapy (Signed)
[facility] OUTPATIENT PHYSICAL [facility]ERAPY NEURO TREATMENT   Patient Name: Joseph Arroyo MRN: [id number] DOB:29-Jun-1940, 81 y.o., male Today's Date: 12/09/2021   PCP: Olin Hauser, DO REFERRING PROVIDER: Lavena Bullion, NP   PT End of Session - 12/09/21 1514     Visit Number 23    Number of Visits 29    Date for PT Re-Evaluation 02/05/22    Authorization Type [employer]    Authorization Time Period Initial Cert 11/02/4095- 3/53/2992; Recert 06/26/8339-96/04/2295; PN 11/27/21    Progress Note Due on Visit 20    PT Start Time 1432    PT Stop Time 1520    PT Time Calculation (min) 48 min    Equipment Utilized During Treatment Gait belt    Activity Tolerance Patient tolerated treatment well    Behavior During Therapy [facility] for tasks assessed/performed                Past Medical History:  Diagnosis Date   Allergic rhinitis due to pollen 11/21/2007   Brachial neuritis or radiculitis NOS    Cervicalgia    Concussion    age 3 - s/p accident   Costal chondritis    Depression    Essential and other specified forms of tremor    GERD (gastroesophageal reflux disease)    in past   Lumbago    Occlusion and stenosis of carotid artery without mention of cerebral infarction    Seizures [facility])    age 40 - after concussion   Past Surgical History:  Procedure Laterality Date   CATARACT EXTRACTION Right 07/18/14   CATARACT EXTRACTION W/PHACO Left 08/01/2014   Procedure: CATARACT EXTRACTION PHACO AND INTRAOCULAR LENS PLACEMENT ([place]);  Surgeon: Leandrew Koyanagi, MD;  Location: [place];  Service: Ophthalmology;  Laterality: Left;  IVA TOPICAL   COLONOSCOPY  2008   OTHER SURGICAL HISTORY  2002   ear surgery   STAPEDES SURGERY Right 2002   Patient Active Problem List   Diagnosis Date Noted   Dysphagia, pharyngeal phase 09/17/2021   Cervicalgia 08/05/2021   Spondylosis without myelopathy or radiculopathy, lumbosacral region 06/24/2021   Lumbosacral facet  hypertrophy 06/03/2021   Lumbar central spinal stenosis, w/o neurogenic claudication (L4-5) 06/03/2021   Lumbar lateral recess stenosis (Bilateral: L2-3, L4-5) (Right: L3-4) 06/03/2021   Lumbosacral foraminal stenosis (Bilateral: L2-3, L3-4) (Left: L5-S1) 06/03/2021   Lumbar nerve root impingement (Right: L3 at L2-3 & L3-4) 06/03/2021   Ligamentum flavum hypertrophy (L3-4, L4-5) 06/03/2021   Abnormal MRI, lumbar spine (04/23/2021) 04/24/2021   Long term prescription benzodiazepine use (alprazolam) (Xanax) 03/24/2021   Grade 1 Retrolisthesis of L2/L3 (5 mm) and L3/L4 (3 mm) 03/24/2021   Levoscoliosis of lumbar spine (L3-4 apex) 03/24/2021   DDD (degenerative disc disease), lumbosacral 03/24/2021   Lumbosacral facet arthropathy (Left: L3-4, L4-5, and L5-S1) 03/24/2021   Tricompartment osteoarthritis of knee (Left) 03/24/2021   Baker cyst (Left) 03/24/2021   Chronic low back pain (1ry area of Pain) (Bilateral) (R>L) w/o sciatica 03/24/2021   Lumbar facet syndrome (Bilateral) 03/24/2021   Chronic lower extremity pain (2ry area of Pain) (Bilateral) (L>R) 03/24/2021   Lumbosacral radiculitis/sensory radiculopathy at L2 (Bilateral) 03/24/2021   Lumbosacral radiculitis/sensory radiculopathy at L3 (Bilateral) 03/24/2021   Chronic pain syndrome 03/23/2021   Pharmacologic therapy 03/23/2021   Disorder of skeletal system 03/23/2021   Problems influencing health status 03/23/2021   PAD (peripheral artery disease) ([place]) 10/01/2020   GAD (generalized anxiety disorder) 01/13/2018   MDD (major depressive disorder) 01/13/2018  Umbilical hernia 73/71/0626   Chronic knee pain (Left) 08/11/2017   Derangement of medial meniscus, posterior horn (Left) 08/11/2017   Vitamin D insufficiency 03/05/2016   BPH without obstruction/lower urinary tract symptoms 07/08/2015   Chronic fatigue 10/23/2014   Parkinson's disease ([place]) 09/20/2014   Major depression, recurrent, full remission ([place]) 07/26/2013   Unsteady  gait 07/26/2013   Orthostatic hypotension 07/26/2013   Depression 07/26/2013   Anxiety 06/23/2013   Chronic anxiety 06/23/2013   Tremor 05/18/2013   Bilateral carotid artery stenosis 08/30/2012    ONSET DATE: 08/11/2021  REFERRING DIAG: Parkinsons Disease  THERAPY DIAG:  Muscle weakness (generalized)  Unsteadiness on feet  Difficulty in walking, not elsewhere classified  Abnormality of gait and mobility  Atypical parkinsonism  Other lack of coordination  Other abnormalities of gait and mobility  Dysarthria and anarthria  Dysphonia  Cognitive communication deficit  Dysphagia, oropharyngeal phase  Rationale for Evaluation and Treatment Rehabilitation  SUBJECTIVE:                                                                                                                                                                                            SUBJECTIVE STATEMENT:  Pt reports feeling well today with c/o mild 4/10 LBP. Reports continued freezing episodes which he's able to come out of. Increased c/o LBP after last session. No c/o increased LBP after this session.  Pt accompanied by: significant other  PERTINENT HISTORY: Per chart history provided by Dr. Consuela Mimes- Mr. Italiano has Parkinson's disease ([place]); Chronic knee pain (Left); Derangement of medial meniscus, posterior horn (Left); PAD (peripheral artery disease) ([place]); Chronic pain syndrome; Grade 1 Retrolisthesis of L2/L3 (5 mm) and L3/L4 (3 mm); Levoscoliosis of lumbar spine (L3-4 apex); DDD (degenerative disc disease), lumbosacral; Lumbosacral facet arthropathy (Left: L3-4, L4-5, and L5-S1); Tricompartment osteoarthritis of knee (Left); Baker cyst (Left); Chronic low back pain (1ry area of Pain) (Bilateral) (R>L) w/o sciatica; Lumbar facet syndrome (Bilateral); Chronic lower extremity pain (2ry area of Pain) (Bilateral) (L>R); Lumbosacral radiculitis/sensory radiculopathy at L2 (Bilateral); Lumbosacral  radiculitis/sensory radiculopathy at L3 (Bilateral); Abnormal MRI, lumbar spine (04/23/2021); Lumbosacral facet hypertrophy; Lumbar central spinal stenosis, w/o neurogenic claudication (L4-5); Lumbar lateral recess stenosis (Bilateral: L2-3, L4-5) (Right: L3-4); Lumbosacral foraminal stenosis (Bilateral: L2-3, L3-4) (Left: L5-S1); Lumbar nerve root impingement (Right: L3 at L2-3 & L3-4); Ligamentum flavum hypertrophy (L3-4, L4-5); Spondylosis without myelopathy or radiculopathy, lumbosacral region; and Cervicalgia on their pertinent problem list   Patient has appointment for Epidural to low back next Tues 08/26/2021  PAIN:  Are you having pain? Yes: NPRS scale: 4/10 Pain location: low back Pain description: ache/soreness Aggravating factors:  prolonged standing, transfers, walking Relieving factors: rest and medications  PRECAUTIONS: Fall  WEIGHT BEARING RESTRICTIONS No  FALLS: Has patient fallen in last 6 months? Yes. Number of falls 2  LIVING ENVIRONMENT: Lives with: lives with their spouse Lives in: House/apartment Stairs: 4 Has following equipment at home: Walker - 2 wheeled  PLOF: Needs assistance with ADLs- intermittent  PATIENT GOALS  to walk better and be as active as possible. Also to get in/out of cars better.   OBJECTIVE: (objective measures completed at initial evaluation unless otherwise dated)   DIAGNOSTIC FINDINGS: IMPRESSION: 1. Severe lumbar levoscoliosis with widespread advanced disc and facet degeneration. 2. Mild spinal stenosis at L4-5. 3. Moderate neural foraminal stenosis on the right at L3-4 and on the left at L5-S1. 4. Moderate right lateral recess stenosis at L2-3.      LOWER EXTREMITY MMT:    MMT (evaluation)  Right Eval Left Eval  Hip flexion 4 4  Hip extension 4 4  Hip abduction 4 4  Hip adduction 4 4  Hip internal rotation 4 4  Hip external rotation 4 4  Knee flexion 4 4  Knee extension 4 4  Ankle dorsiflexion 4 4  Ankle  plantarflexion 4 4  Ankle inversion 4 4  Ankle eversion 4 4  (Blank rows = not tested)     FUNCTIONAL TESTS (EVALUATION):  5 times sit to stand: 14 sec without UE support Timed up and go (TUG): 15.67 sec with RW 10 meter walk test: 10.67 sec = 0.94 m/s using RW Berg Balance Scale: 48/56- see flowsheet for details  PATIENT SURVEYS:  FOTO : 49 8/24: 47  TODAY'S TREATMENT:   Heat applied to patient's lower back region for all seated rest breaks as well as seated therapeutic exercises  NMR: CGA for safety unless otherwise stated - Alternating lateral lunging with shoulder ABD x10ea - Alternating forward lunging with shoulder flexion x15ea, mirror for proprioceptive feedback - Forward step ups on airex pad, no UE support x10ea; mirror for proprioceptive feedback - Seated side bend with arm OH, x5ea holding for 3 breaths - Seated anterior lean with forward reaching to ground, followed by truncal/cervical ext with arms OH, x12 - 6" toe taps switching (turning) to 2" toe taps (working on balance and freezing episodes with turns, no UE support - airex pad with tandem stance, reaching outside BOS at white board; matching colored letters to colored boxes, no UE support - ambulation with RW with multiple turns to address freezing episodes; cues for RW proximity, slowed cadence, and step length     Not performed today: Standing hamstring lengthening 60 seconds on stairs Seated hamstring lengthening 60 seconds  Standing marches 20x each LE  seated lateral step over and back 10x each side  GTB alternating LAQ 12x each LE  GTB abduction 15x each LE  PATIENT EDUCATION: Education details: Pt educated throughout session about proper posture and technique with exercises. Improved exercise technique, movement at target joints, use of target muscles after min to mod verbal, visual, tactile cues.  Person educated: Patient Education method: Explanation, Demonstration, Tactile cues, and Verbal  cues Education comprehension: verbalized understanding, returned demonstration, verbal cues required, tactile cues required, and needs further education   HOME EXERCISE PROGRAM: No updates on this date, pt to continue HEP as previously given: PWR! Basic 4 seated x 10 ea, handout provided: addended to not perform seated PRW! Rock exercise and added the below to program on 09/25/21  Access Code: [email or web address] URL: [email or web address] Date:  09/25/2021 Prepared by: Rivka Barbara  Exercises - Standing Romberg to 3/4 Tandem Stance  - 1 x daily - 7 x weekly - 2 sets - 30 seocnd hold - Standing March with Counter Support  - 1 x daily - 7 x weekly - 2 sets - 10 reps - Modified Single Leg Balance on Step  - 1 x daily - 7 x weekly - 2 sets - 30 second hold  Access Code: [email or web address] URL: [email or web address] Date: 10/21/2021 Prepared by: Janna Arch  Exercises - Seated Abdominal Press into [employer]  - 1 x daily - 7 x weekly - 2 sets - 10 reps - 5 hold - Seated Flexion Stretch with Swiss Ball  - 1 x daily - 7 x weekly - 2 sets - 10 reps - 5 hold  Access Code: [email or web address] URL: [email or web address] Date: 12/04/2021 Prepared by: Janna Arch  Exercises - Seated Hamstring Stretch  - 1 x daily - 7 x weekly - 2 sets - 2 reps - 30 hold - Standing Hamstring Stretch with Step  - 1 x daily - 7 x weekly - 2 sets - 2 reps - 30 hold  Access Code: [email or web address] URL: [email or web address] Date: 12/09/2021 Prepared by: Amalia Hailey  Exercises - Seated Alternating Side Stretch with Arm Overhead  - 1 x daily - 7 x weekly - 2 sets - 10 reps - Seated Thoracic Extension Arms Overhead  - 1 x daily - 7 x weekly - 2 sets - 10 reps  GOALS: Goals reviewed with patient? Yes  SHORT TERM GOALS: Target date: 10/02/2021  Pt will be independent with initial HEP in order to improve strength and balance in order to decrease fall risk and improve function at home and  work.  Baseline: 08/21/2021-No formal HEP in place Goal status: MET    LONG TERM GOALS: Target date: 02/05/2022  Pt will improve BERG by at least 3 points in order to demonstrate clinically significant improvement in balance.    Baseline: 08/21/2021= 48/56 8/24:47/56; 11/13/2021= 47/56 Goal status: ongoing  2.  Pt will be independent with final HEP in order to improve strength and balance in order to decrease fall risk and improve function at home and work.  Baseline: No HEP in place, 8/24: completing current HEP but still progressing; 11/13/2021- Patient continues to verbalize and demonstrate understanding of HEP for stretching/strengthening and mobility but still progressing and altering program as appropriate Goal status: IN PROGRESS  3.  Pt will decrease 5TSTS by at least 4 seconds in order to demonstrate clinically significant improvement in LE strength. Baseline: 08/21/2021= 19.01 sec without UE support 8/24:10.56 sec Goal status: MET  4.  Pt will decrease TUG to below 14 seconds/decrease in order to demonstrate decreased fall risk. Baseline:  08/21/2021=15.67 with RW 8/24: 12.41 Goal status: MET  5.  Pt will improve FOTO to target score of 56 to display perceived improvements in ability to complete ADL's.  Baseline: 08/21/2021=49 8/24: 45.6; 11/13/2021=46 Goal status: IN PROGRESS  6.  Pt will increase 10MWT by at least 0.15 m/s in order to demonstrate clinically significant improvement in community ambulation.   Baseline: 08/22/2021=0.94 m/s 8/24:1.31ms Goal status: MET  7 Pt will improve miniBESTest by at least 4 points to demonstrate clinically significant improvement in reduced falls with parkinsonian related deficits.  Baseline: 19/28; 11/13/2021- Unable to test due to time constraint- will test next session and update goal. 9/19: 18/28  Goal Status: ONGOING  8. Pt will increase 6MWT by at least [id number]  in order to demonstrate clinically significant improvement in  cardiopulmonary endurance and community ambulation  Baseline: 11/13/2021= 650 feet with walker Goal Status: NEW  ASSESSMENT:  CLINICAL IMPRESSION:  Pt tolerated treatment well today with no c/o increased LBP post session. Stretching and marching deferred today as pt reports doing them at home prior to PT session. Incorporated dynamic ROM interventions and provided as HEP; see above. Also incorporated large forward/lateral lunged with ipsilateral arm movement for balance, ROM, and strengthening. Pt continues to have increased difficulty with romberg/NBOS with cross body reaching. Mild freezing episodes noted with turns during gait that improved with toe tap/turning intervention. Pt and spouse educated extensively regarding PD progression in regards to freezing episodes, cognition, and lung function. Unable to provide 5 S handout for freezing episodes but plan to provide at next session. Pt will continue to benefit from skilled OPPT services for slowed progression of PD symptoms.   OBJECTIVE IMPAIRMENTS Abnormal gait, decreased activity tolerance, decreased balance, decreased coordination, decreased endurance, decreased knowledge of use of DME, decreased mobility, difficulty walking, decreased ROM, decreased strength, hypomobility, and impaired flexibility.   ACTIVITY LIMITATIONS carrying, lifting, bending, standing, squatting, sleeping, stairs, transfers, continence, bathing, toileting, and dressing  PARTICIPATION LIMITATIONS: cleaning, laundry, medication management, driving, shopping, community activity, and yard work  PERSONAL FACTORS Age are also affecting patient's functional outcome.   REHAB POTENTIAL: Good  CLINICAL DECISION MAKING: Stable/uncomplicated  EVALUATION COMPLEXITY: Low  PLAN: PT FREQUENCY: 2x/week  PT DURATION: 12 weeks  PLANNED INTERVENTIONS: Therapeutic exercises, Therapeutic activity, Neuromuscular re-education, Balance training, Gait training, Patient/Family  education, Joint mobilization, Stair training, Vestibular training, Canalith repositioning, DME instructions, Dry Needling, Electrical stimulation, Spinal manipulation, and Spinal mobilization  PLAN FOR NEXT SESSION: dynamic cross body movements, continue balance training, monitor hypotension and orthostatics, ROM for flexibility, continue plan    Herminio Commons, PT, DPT 3:36 PM,12/09/21 Physical Therapist - [facility]

## 2021-12-10 ENCOUNTER — Ambulatory Visit: Payer: Medicare HMO | Attending: Pain Medicine | Admitting: Pain Medicine

## 2021-12-10 ENCOUNTER — Encounter: Payer: Self-pay | Admitting: Pain Medicine

## 2021-12-10 VITALS — BP 128/75 | HR 54 | Temp 97.1°F | Resp 16 | Ht 68.0 in | Wt 131.0 lb

## 2021-12-10 DIAGNOSIS — M545 Low back pain, unspecified: Secondary | ICD-10-CM | POA: Insufficient documentation

## 2021-12-10 DIAGNOSIS — R937 Abnormal findings on diagnostic imaging of other parts of musculoskeletal system: Secondary | ICD-10-CM | POA: Diagnosis not present

## 2021-12-10 DIAGNOSIS — M4310 Spondylolisthesis, site unspecified: Secondary | ICD-10-CM | POA: Insufficient documentation

## 2021-12-10 DIAGNOSIS — M47817 Spondylosis without myelopathy or radiculopathy, lumbosacral region: Secondary | ICD-10-CM | POA: Insufficient documentation

## 2021-12-10 DIAGNOSIS — M47816 Spondylosis without myelopathy or radiculopathy, lumbar region: Secondary | ICD-10-CM | POA: Diagnosis not present

## 2021-12-10 DIAGNOSIS — G8929 Other chronic pain: Secondary | ICD-10-CM | POA: Diagnosis not present

## 2021-12-10 NOTE — Patient Instructions (Addendum)
______________________________________________________________________  Preparing for Procedure with Sedation  NOTICE: Due to recent regulatory changes, starting on September 30, 2020, procedures requiring intravenous (IV) sedation will no longer be performed at the [facility].  These types of procedures are required to be performed at [facility] ambulatory surgery facility.  We are very sorry for the inconvenience.  Procedure appointments are limited to planned procedures: No Prescription Refills. No disability issues will be discussed. No medication changes will be discussed.  Instructions: Oral Intake: Do not eat or drink anything for at least 8 hours prior to your procedure. (Exception: Blood Pressure Medication. See below.) Transportation: A driver is required. You may not drive yourself after the procedure. Blood Pressure Medicine: Do not forget to take your blood pressure medicine with a sip of water the morning of the procedure. If your Diastolic (lower reading) is above 100 mmHg, elective cases will be cancelled/rescheduled. Blood thinners: These will need to be stopped for procedures. Notify our staff if you are taking any blood thinners. Depending on which one you take, there will be specific instructions on how and when to stop it. Diabetics on insulin: Notify the staff so that you can be scheduled 1st case in the morning. If your diabetes requires high dose insulin, take only  of your normal insulin dose the morning of the procedure and notify the staff that you have done so. Preventing infections: Shower with an antibacterial soap the morning of your procedure. Build-up your immune system: Take 1000 mg of Vitamin C with every meal (3 times a day) the day prior to your procedure. Antibiotics: Inform the staff if you have a condition or reason that requires you to take antibiotics before dental procedures. Pregnancy: If you are pregnant, call and cancel the procedure. Sickness: If  you have a cold, fever, or any active infections, call and cancel the procedure. Arrival: You must be in the facility at least 30 minutes prior to your scheduled procedure. Children: Do not bring children with you. Dress appropriately: There is always the possibility that your clothing may get soiled. Valuables: Do not bring any jewelry or valuables.  Reasons to call and reschedule or cancel your procedure: (Following these recommendations will minimize the risk of a serious complication.) Surgeries: Avoid having procedures within 2 weeks of any surgery. (Avoid for 2 weeks before or after any surgery). Flu Shots: Avoid having procedures within 2 weeks of a flu shots. (Avoid for 2 weeks before or after immunizations). Barium: Avoid having a procedure within 7-10 days after having had a radiological study involving the use of radiological contrast. (Myelograms, Barium swallow or enema study). Heart attacks: Avoid any elective procedures or surgeries for the initial 6 months after a "Myocardial Infarction" (Heart Attack). Blood thinners: It is imperative that you stop these medications before procedures. Let us know if you if you take any blood thinner.  Infection: Avoid procedures during or within two weeks of an infection (including chest colds or gastrointestinal problems). Symptoms associated with infections include: Localized redness, fever, chills, night sweats or profuse sweating, burning sensation when voiding, cough, congestion, stuffiness, runny nose, sore throat, diarrhea, nausea, vomiting, cold or Flu symptoms, recent or current infections. It is specially important if the infection is over the area that we intend to treat. Heart and lung problems: Symptoms that may suggest an active cardiopulmonary problem include: cough, chest pain, breathing difficulties or shortness of breath, dizziness, ankle swelling, uncontrolled high or unusually low blood pressure, and/or palpitations. If you are    experiencing any of these symptoms, cancel your procedure and contact your primary care physician for an evaluation.  Remember:  Regular Business hours are:  Monday to Thursday 8:00 AM to 4:00 PM  Provider's Schedule: Francisco Naveira, MD:  Procedure days: Tuesday and Thursday 7:30 AM to 4:00 PM  Bilal Lateef, MD:  Procedure days: Monday and Wednesday 7:30 AM to 4:00 PM ______________________________________________________________________  ____________________________________________________________________________________________  General Risks and Possible Complications  Patient Responsibilities: It is important that you read this as it is part of your informed consent. It is our duty to inform you of the risks and possible complications associated with treatments offered to you. It is your responsibility as a patient to read this and to ask questions about anything that is not clear or that you believe was not covered in this document.  Patient's Rights: You have the right to refuse treatment. You also have the right to change your mind, even after initially having agreed to have the treatment done. However, under this last option, if you wait until the last second to change your mind, you may be charged for the materials used up to that point.  Introduction: Medicine is not an exact science. Everything in Medicine, including the lack of treatment(s), carries the potential for danger, harm, or loss (which is by definition: Risk). In Medicine, a complication is a secondary problem, condition, or disease that can aggravate an already existing one. All treatments carry the risk of possible complications. The fact that a side effects or complications occurs, does not imply that the treatment was conducted incorrectly. It must be clearly understood that these can happen even when everything is done following the highest safety standards.  No treatment: You can choose not to proceed with the  proposed treatment alternative. The "PRO(s)" would include: avoiding the risk of complications associated with the therapy. The "CON(s)" would include: not getting any of the treatment benefits. These benefits fall under one of three categories: diagnostic; therapeutic; and/or palliative. Diagnostic benefits include: getting information which can ultimately lead to improvement of the disease or symptom(s). Therapeutic benefits are those associated with the successful treatment of the disease. Finally, palliative benefits are those related to the decrease of the primary symptoms, without necessarily curing the condition (example: decreasing the pain from a flare-up of a chronic condition, such as incurable terminal cancer).  General Risks and Complications: These are associated to most interventional treatments. They can occur alone, or in combination. They fall under one of the following six (6) categories: no benefit or worsening of symptoms; bleeding; infection; nerve damage; allergic reactions; and/or death. No benefits or worsening of symptoms: In Medicine there are no guarantees, only probabilities. No healthcare provider can ever guarantee that a medical treatment will work, they can only state the probability that it may. Furthermore, there is always the possibility that the condition may worsen, either directly, or indirectly, as a consequence of the treatment. Bleeding: This is more common if the patient is taking a blood thinner, either prescription or over the counter (example: Goody Powders, Fish oil, Aspirin, Garlic, etc.), or if suffering a condition associated with impaired coagulation (example: Hemophilia, cirrhosis of the liver, low platelet counts, etc.). However, even if you do not have one on these, it can still happen. If you have any of these conditions, or take one of these drugs, make sure to notify your treating physician. Infection: This is more common in patients with a compromised  immune system, either due to disease (example:   diabetes, cancer, human immunodeficiency virus [HIV], etc.), or due to medications or treatments (example: therapies used to treat cancer and rheumatological diseases). However, even if you do not have one on these, it can still happen. If you have any of these conditions, or take one of these drugs, make sure to notify your treating physician. Nerve Damage: This is more common when the treatment is an invasive one, but it can also happen with the use of medications, such as those used in the treatment of cancer. The damage can occur to small secondary nerves, or to large primary ones, such as those in the spinal cord and brain. This damage may be temporary or permanent and it may lead to impairments that can range from temporary numbness to permanent paralysis and/or brain death. Allergic Reactions: Any time a substance or material comes in contact with our body, there is the possibility of an allergic reaction. These can range from a mild skin rash (contact dermatitis) to a severe systemic reaction (anaphylactic reaction), which can result in death. Death: In general, any medical intervention can result in death, most of the time due to an unforeseen complication. ____________________________________________________________________________________________ Facet Blocks Patient Information  Description: The facets are joints in the spine between the vertebrae.  Like any joints in the body, facets can become irritated and painful.  Arthritis can also effect the facets.  By injecting steroids and local anesthetic in and around these joints, we can temporarily block the nerve supply to them.  Steroids act directly on irritated nerves and tissues to reduce selling and inflammation which often leads to decreased pain.  Facet blocks may be done anywhere along the spine from the neck to the low back depending upon the location of your pain.   After numbing the skin  with local anesthetic (like Novocaine), a small needle is passed onto the facet joints under x-ray guidance.  You may experience a sensation of pressure while this is being done.  The entire block usually lasts about 15-25 minutes.   Conditions which may be treated by facet blocks:  Low back/buttock pain Neck/shoulder pain Certain types of headaches  Preparation for the injection:  Do not eat any solid food or dairy products within 8 hours of your appointment. You may drink clear liquid up to 3 hours before appointment.  Clear liquids include water, black coffee, juice or soda.  No milk or cream please. You may take your regular medication, including pain medications, with a sip of water before your appointment.  Diabetics should hold regular insulin (if taken separately) and take 1/2 normal NPH dose the morning of the procedure.  Carry some sugar containing items with you to your appointment. A driver must accompany you and be prepared to drive you home after your procedure. Bring all your current medications with you. An IV may be inserted and sedation may be given at the discretion of the physician. A blood pressure cuff, EKG and other monitors will often be applied during the procedure.  Some patients may need to have extra oxygen administered for a short period. You will be asked to provide medical information, including your allergies and medications, prior to the procedure.  We must know immediately if you are taking blood thinners (like Coumadin/Warfarin) or if you are allergic to IV iodine contrast (dye).  We must know if you could possible be pregnant.  Possible side-effects:  Bleeding from needle site Infection (rare, may require surgery) Nerve injury (rare) Numbness & tingling (temporary) Difficulty  urinating (rare, temporary) Spinal headache (a headache worse with upright posture) Light-headedness (temporary) Pain at injection site (serveral days) Decreased blood pressure  (rare, temporary) Weakness in arm/leg (temporary) Pressure sensation in back/neck (temporary)   Call if you experience:  Fever/chills associated with headache or increased back/neck pain Headache worsened by an upright position New onset, weakness or numbness of an extremity below the injection site Hives or difficulty breathing (go to the emergency room) Inflammation or drainage at the injection site(s) Severe back/neck pain greater than usual New symptoms which are concerning to you  Please note:  Although the local anesthetic injected can often make your back or neck feel good for several hours after the injection, the pain will likely return. It takes 3-7 days for steroids to work.  You may not notice any pain relief for at least one week.  If effective, we will often do a series of 2-3 injections spaced 3-6 weeks apart to maximally decrease your pain.  After the initial series, you may be a candidate for a more permanent nerve block of the facets.  If you have any questions, please call #[phone]) [facility]

## 2021-12-10 NOTE — Progress Notes (Signed)
Safety precautions to be maintained throughout the outpatient stay will include: orient to surroundings, keep bed in low position, maintain call bell within reach at all times, provide assistance with transfer out of bed and ambulation.  

## 2021-12-11 ENCOUNTER — Ambulatory Visit: Payer: Medicare HMO

## 2021-12-11 ENCOUNTER — Encounter: Payer: Medicare HMO | Admitting: Speech Pathology

## 2021-12-11 DIAGNOSIS — R269 Unspecified abnormalities of gait and mobility: Secondary | ICD-10-CM | POA: Diagnosis not present

## 2021-12-11 DIAGNOSIS — G20C Parkinsonism, unspecified: Secondary | ICD-10-CM

## 2021-12-11 DIAGNOSIS — R2681 Unsteadiness on feet: Secondary | ICD-10-CM | POA: Diagnosis not present

## 2021-12-11 DIAGNOSIS — R262 Difficulty in walking, not elsewhere classified: Secondary | ICD-10-CM

## 2021-12-11 DIAGNOSIS — R41841 Cognitive communication deficit: Secondary | ICD-10-CM | POA: Diagnosis not present

## 2021-12-11 DIAGNOSIS — R2689 Other abnormalities of gait and mobility: Secondary | ICD-10-CM

## 2021-12-11 DIAGNOSIS — R278 Other lack of coordination: Secondary | ICD-10-CM

## 2021-12-11 DIAGNOSIS — M6281 Muscle weakness (generalized): Secondary | ICD-10-CM

## 2021-12-11 DIAGNOSIS — R490 Dysphonia: Secondary | ICD-10-CM | POA: Diagnosis not present

## 2021-12-11 DIAGNOSIS — R471 Dysarthria and anarthria: Secondary | ICD-10-CM | POA: Diagnosis not present

## 2021-12-11 DIAGNOSIS — R1312 Dysphagia, oropharyngeal phase: Secondary | ICD-10-CM | POA: Diagnosis not present

## 2021-12-11 NOTE — Therapy (Signed)
[facility] OUTPATIENT PHYSICAL THERAPY NEURO TREATMENT   Patient Name: Joseph Arroyo MRN: [id number] DOB:08-19-1940, 81 y.o., male Today's Date: 12/13/2021   PCP: Olin Hauser, DO REFERRING PROVIDER: Lavena Bullion, NP         Past Medical History:  Diagnosis Date   Allergic rhinitis due to pollen 11/21/2007   Brachial neuritis or radiculitis NOS    Cervicalgia    Concussion    age 81 - s/p accident   Costal chondritis    Depression    Essential and other specified forms of tremor    GERD (gastroesophageal reflux disease)    in past   Lumbago    Occlusion and stenosis of carotid artery without mention of cerebral infarction    Seizures [facility])    age 81 - after concussion   Past Surgical History:  Procedure Laterality Date   CATARACT EXTRACTION Right 07/18/14   CATARACT EXTRACTION W/PHACO Left 08/01/2014   Procedure: CATARACT EXTRACTION PHACO AND INTRAOCULAR LENS PLACEMENT ([place]);  Surgeon: Leandrew Koyanagi, MD;  Location: [place];  Service: Ophthalmology;  Laterality: Left;  IVA TOPICAL   COLONOSCOPY  2008   OTHER SURGICAL HISTORY  2002   ear surgery   STAPEDES SURGERY Right 2002   Patient Active Problem List   Diagnosis Date Noted   Dysphagia, pharyngeal phase 09/17/2021   Cervicalgia 08/05/2021   Spondylosis without myelopathy or radiculopathy, lumbosacral region 06/24/2021   Lumbosacral facet hypertrophy 06/03/2021   Lumbar central spinal stenosis, w/o neurogenic claudication (L4-5) 06/03/2021   Lumbar lateral recess stenosis (Bilateral: L2-3, L4-5) (Right: L3-4) 06/03/2021   Lumbosacral foraminal stenosis (Bilateral: L2-3, L3-4) (Left: L5-S1) 06/03/2021   Lumbar nerve root impingement (Right: L3 at L2-3 & L3-4) 06/03/2021   Ligamentum flavum hypertrophy (L3-4, L4-5) 06/03/2021   Abnormal MRI, lumbar spine (04/23/2021) 04/24/2021   Long term prescription benzodiazepine use (alprazolam) (Xanax) 03/24/2021   Grade 1 Retrolisthesis of  L2/L3 (5 mm) and L3/L4 (3 mm) 03/24/2021   Levoscoliosis of lumbar spine (L3-4 apex) 03/24/2021   DDD (degenerative disc disease), lumbosacral 03/24/2021   Lumbosacral facet arthropathy (Left: L3-4, L4-5, and L5-S1) 03/24/2021   Tricompartment osteoarthritis of knee (Left) 03/24/2021   Baker cyst (Left) 03/24/2021   Chronic low back pain (1ry area of Pain) (Bilateral) (R>L) w/o sciatica 03/24/2021   Lumbar facet syndrome (Bilateral) 03/24/2021   Chronic lower extremity pain (2ry area of Pain) (Bilateral) (L>R) 03/24/2021   Lumbosacral radiculitis/sensory radiculopathy at L2 (Bilateral) 03/24/2021   Lumbosacral radiculitis/sensory radiculopathy at L3 (Bilateral) 03/24/2021   Chronic pain syndrome 03/23/2021   Pharmacologic therapy 03/23/2021   Disorder of skeletal system 03/23/2021   Problems influencing health status 03/23/2021   PAD (peripheral artery disease) ([place]) 10/01/2020   GAD (generalized anxiety disorder) 01/13/2018   MDD (major depressive disorder) 26/71/2458   Umbilical hernia 09/98/3382   Chronic knee pain (Left) 08/11/2017   Derangement of medial meniscus, posterior horn (Left) 08/11/2017   Vitamin D insufficiency 03/05/2016   BPH without obstruction/lower urinary tract symptoms 07/08/2015   Chronic fatigue 10/23/2014   Parkinson's disease ([place]) 09/20/2014   Major depression, recurrent, full remission ([place]) 07/26/2013   Unsteady gait 07/26/2013   Orthostatic hypotension 07/26/2013   Depression 07/26/2013   Anxiety 06/23/2013   Chronic anxiety 06/23/2013   Tremor 05/18/2013   Bilateral carotid artery stenosis 08/30/2012    ONSET DATE: 08/11/2021  REFERRING DIAG: Parkinsons Disease  THERAPY DIAG:  Muscle weakness (generalized)  Unsteadiness on feet  Difficulty in walking, not elsewhere classified  Abnormality of gait and mobility  Atypical parkinsonism  Other lack of coordination  Other abnormalities of gait and mobility  Rationale for Evaluation and  Treatment Rehabilitation  SUBJECTIVE:                                                                                                                                                                                            SUBJECTIVE STATEMENT:  Pt reports feeling okay today. States still working with MD and trying to get insurance to approve his procedure to help with his chronic low back pain. .  Pt accompanied by: significant other  PERTINENT HISTORY: Per chart history provided by Dr. Consuela Mimes- Mr. Justo has Parkinson's disease ([place]); Chronic knee pain (Left); Derangement of medial meniscus, posterior horn (Left); PAD (peripheral artery disease) ([place]); Chronic pain syndrome; Grade 1 Retrolisthesis of L2/L3 (5 mm) and L3/L4 (3 mm); Levoscoliosis of lumbar spine (L3-4 apex); DDD (degenerative disc disease), lumbosacral; Lumbosacral facet arthropathy (Left: L3-4, L4-5, and L5-S1); Tricompartment osteoarthritis of knee (Left); Baker cyst (Left); Chronic low back pain (1ry area of Pain) (Bilateral) (R>L) w/o sciatica; Lumbar facet syndrome (Bilateral); Chronic lower extremity pain (2ry area of Pain) (Bilateral) (L>R); Lumbosacral radiculitis/sensory radiculopathy at L2 (Bilateral); Lumbosacral radiculitis/sensory radiculopathy at L3 (Bilateral); Abnormal MRI, lumbar spine (04/23/2021); Lumbosacral facet hypertrophy; Lumbar central spinal stenosis, w/o neurogenic claudication (L4-5); Lumbar lateral recess stenosis (Bilateral: L2-3, L4-5) (Right: L3-4); Lumbosacral foraminal stenosis (Bilateral: L2-3, L3-4) (Left: L5-S1); Lumbar nerve root impingement (Right: L3 at L2-3 & L3-4); Ligamentum flavum hypertrophy (L3-4, L4-5); Spondylosis without myelopathy or radiculopathy, lumbosacral region; and Cervicalgia on their pertinent problem list   Patient has appointment for Epidural to low back next Tues 08/26/2021  PAIN:  Are you having pain? Yes: NPRS scale: 4/10 Pain location: low back Pain description:  ache/soreness Aggravating factors: prolonged standing, transfers, walking Relieving factors: rest and medications  PRECAUTIONS: Fall  WEIGHT BEARING RESTRICTIONS No  FALLS: Has patient fallen in last 6 months? Yes. Number of falls 2  LIVING ENVIRONMENT: Lives with: lives with their spouse Lives in: House/apartment Stairs: 4 Has following equipment at home: Walker - 2 wheeled  PLOF: Needs assistance with ADLs- intermittent  PATIENT GOALS  to walk better and be as active as possible. Also to get in/out of cars better.   OBJECTIVE: (objective measures completed at initial evaluation unless otherwise dated)   DIAGNOSTIC FINDINGS: IMPRESSION: 1. Severe lumbar levoscoliosis with widespread advanced disc and facet degeneration. 2. Mild spinal stenosis at L4-5. 3. Moderate neural foraminal stenosis on the right at L3-4 and on the left at L5-S1. 4. Moderate right lateral recess stenosis at L2-3.  LOWER EXTREMITY MMT:    MMT (evaluation)  Right Eval Left Eval  Hip flexion 4 4  Hip extension 4 4  Hip abduction 4 4  Hip adduction 4 4  Hip internal rotation 4 4  Hip external rotation 4 4  Knee flexion 4 4  Knee extension 4 4  Ankle dorsiflexion 4 4  Ankle plantarflexion 4 4  Ankle inversion 4 4  Ankle eversion 4 4  (Blank rows = not tested)     FUNCTIONAL TESTS (EVALUATION):  5 times sit to stand: 14 sec without UE support Timed up and go (TUG): 15.67 sec with RW 10 meter walk test: 10.67 sec = 0.94 m/s using RW Berg Balance Scale: 48/56- see flowsheet for details  PATIENT SURVEYS:  FOTO : 49 8/24: 47  TODAY'S TREATMENT:   Heat applied to patient's lower back region for all seated rest breaks as well as seated therapeutic exercises  NMR: CGA for safety unless otherwise stated - Alternating lateral lunging with shoulder ABD x15 ea - Alternating forward lunging with shoulder flexion x15 reps (VC and visual demo for correct technique) - patient denied any  increase in pain - Forward step ups onto 6" step wihout UE support  x 12 reps alt LE - Forward step up with opp LE high knee x 15 reps each LE - Forward walking in // bars (5 step to end of bar) - repeat 10x focusing on maintaining large steps -Retro walking in // bars (9 steps to end of bar)- repeat 10 x focusing on maintaining large steps -Seated side bend with arm OH, x5ea holding for 3 breaths -standing  step over 1/2 foam and back x 12 reps alt LE.    THEREX:  Seated hamstring stretch x 30 sec x 2 each LE 60 seconds  Standing marches 20x each LE Seated lateral step over and back 10x each side  Seated LAQ with 3 sec hold x 15 reps each LE Standing QL push for erect spine position - hold 5 sec x 10 reps (in front of mirror in // bars for feedback)   PATIENT EDUCATION: Education details: Pt educated throughout session about proper posture and technique with exercises. Improved exercise technique, movement at target joints, use of target muscles after min to mod verbal, visual, tactile cues.  Person educated: Patient Education method: Explanation, Demonstration, Tactile cues, and Verbal cues Education comprehension: verbalized understanding, returned demonstration, verbal cues required, tactile cues required, and needs further education   HOME EXERCISE PROGRAM: No updates on this date, pt to continue HEP as previously given: PWR! Basic 4 seated x 10 ea, handout provided: addended to not perform seated PRW! Rock exercise and added the below to program on 09/25/21  Access Code: [email or web address] URL: [email or web address] Date: 09/25/2021 Prepared by: Rivka Barbara  Exercises - Standing Romberg to 3/4 Tandem Stance  - 1 x daily - 7 x weekly - 2 sets - 30 seocnd hold - Standing March with Counter Support  - 1 x daily - 7 x weekly - 2 sets - 10 reps - Modified Single Leg Balance on Step  - 1 x daily - 7 x weekly - 2 sets - 30 second hold  Access Code: [email or web address] URL:  [email or web address] Date: 10/21/2021 Prepared by: Janna Arch  Exercises - Seated Abdominal Press into [employer]  - 1 x daily - 7 x weekly - 2 sets - 10 reps - 5 hold - Seated Flexion Stretch with Swiss Ball  - 1 x daily -  7 x weekly - 2 sets - 10 reps - 5 hold  Access Code: [email or web address] URL: [email or web address] Date: 12/04/2021 Prepared by: Janna Arch  Exercises - Seated Hamstring Stretch  - 1 x daily - 7 x weekly - 2 sets - 2 reps - 30 hold - Standing Hamstring Stretch with Step  - 1 x daily - 7 x weekly - 2 sets - 2 reps - 30 hold  Access Code: [email or web address] URL: [email or web address] Date: 12/09/2021 Prepared by: Amalia Hailey  Exercises - Seated Alternating Side Stretch with Arm Overhead  - 1 x daily - 7 x weekly - 2 sets - 10 reps - Seated Thoracic Extension Arms Overhead  - 1 x daily - 7 x weekly - 2 sets - 10 reps  GOALS: Goals reviewed with patient? Yes  SHORT TERM GOALS: Target date: 10/02/2021  Pt will be independent with initial HEP in order to improve strength and balance in order to decrease fall risk and improve function at home and work.  Baseline: 08/21/2021-No formal HEP in place Goal status: MET    LONG TERM GOALS: Target date: 02/05/2022  Pt will improve BERG by at least 3 points in order to demonstrate clinically significant improvement in balance.    Baseline: 08/21/2021= 48/56 8/24:47/56; 11/13/2021= 47/56 Goal status: ongoing  2.  Pt will be independent with final HEP in order to improve strength and balance in order to decrease fall risk and improve function at home and work.  Baseline: No HEP in place, 8/24: completing current HEP but still progressing; 11/13/2021- Patient continues to verbalize and demonstrate understanding of HEP for stretching/strengthening and mobility but still progressing and altering program as appropriate Goal status: IN PROGRESS  3.  Pt will decrease 5TSTS by at least 4 seconds in order  to demonstrate clinically significant improvement in LE strength. Baseline: 08/21/2021= 19.01 sec without UE support 8/24:10.56 sec Goal status: MET  4.  Pt will decrease TUG to below 14 seconds/decrease in order to demonstrate decreased fall risk. Baseline:  08/21/2021=15.67 with RW 8/24: 12.41 Goal status: MET  5.  Pt will improve FOTO to target score of 56 to display perceived improvements in ability to complete ADL's.  Baseline: 08/21/2021=49 8/24: 45.6; 11/13/2021=46 Goal status: IN PROGRESS  6.  Pt will increase 10MWT by at least 0.15 m/s in order to demonstrate clinically significant improvement in community ambulation.   Baseline: 08/22/2021=0.94 m/s 8/24:1.13ms Goal status: MET  7 Pt will improve miniBESTest by at least 4 points to demonstrate clinically significant improvement in reduced falls with parkinsonian related deficits.  Baseline: 19/28; 11/13/2021- Unable to test due to time constraint- will test next session and update goal. 9/19: 18/28  Goal Status: ONGOING  8. Pt will increase 6MWT by at least [id number] order to demonstrate clinically significant improvement in cardiopulmonary endurance and community ambulation  Baseline: 11/13/2021= 650 feet with walker Goal Status: NEW  ASSESSMENT:  CLINICAL IMPRESSION:  Pt performed well today during session accepting verbal cues and visual aid as needed to complete activities. Patient with no c/o increased LBP post session. Provided freezing handout education material from previous visit. Patient responded well to all balance and therex without any significant LOB. Pt will continue to benefit from skilled OPPT services for slowed progression of PD symptoms.   OBJECTIVE IMPAIRMENTS Abnormal gait, decreased activity tolerance, decreased balance, decreased coordination, decreased endurance, decreased knowledge of use of DME, decreased mobility, difficulty walking, decreased ROM, decreased strength, hypomobility, and impaired  flexibility.   ACTIVITY  LIMITATIONS carrying, lifting, bending, standing, squatting, sleeping, stairs, transfers, continence, bathing, toileting, and dressing  PARTICIPATION LIMITATIONS: cleaning, laundry, medication management, driving, shopping, community activity, and yard work  PERSONAL FACTORS Age are also affecting patient's functional outcome.   REHAB POTENTIAL: Good  CLINICAL DECISION MAKING: Stable/uncomplicated  EVALUATION COMPLEXITY: Low  PLAN: PT FREQUENCY: 2x/week  PT DURATION: 12 weeks  PLANNED INTERVENTIONS: Therapeutic exercises, Therapeutic activity, Neuromuscular re-education, Balance training, Gait training, Patient/Family education, Joint mobilization, Stair training, Vestibular training, Canalith repositioning, DME instructions, Dry Needling, Electrical stimulation, Spinal manipulation, and Spinal mobilization  PLAN FOR NEXT SESSION: dynamic cross body movements, continue balance training, monitor hypotension and orthostatics, ROM for flexibility, continue plan   Ollen Bowl, PT 7:34 AM,12/13/21 Physical Therapist - [facility]

## 2021-12-16 ENCOUNTER — Encounter: Payer: Medicare HMO | Admitting: Occupational Therapy

## 2021-12-16 ENCOUNTER — Ambulatory Visit: Payer: Medicare HMO

## 2021-12-16 ENCOUNTER — Encounter: Payer: Medicare HMO | Admitting: Speech Pathology

## 2021-12-16 DIAGNOSIS — R262 Difficulty in walking, not elsewhere classified: Secondary | ICD-10-CM | POA: Diagnosis not present

## 2021-12-16 DIAGNOSIS — R2681 Unsteadiness on feet: Secondary | ICD-10-CM

## 2021-12-16 DIAGNOSIS — R269 Unspecified abnormalities of gait and mobility: Secondary | ICD-10-CM

## 2021-12-16 DIAGNOSIS — R2689 Other abnormalities of gait and mobility: Secondary | ICD-10-CM | POA: Diagnosis not present

## 2021-12-16 DIAGNOSIS — R490 Dysphonia: Secondary | ICD-10-CM | POA: Diagnosis not present

## 2021-12-16 DIAGNOSIS — R278 Other lack of coordination: Secondary | ICD-10-CM

## 2021-12-16 DIAGNOSIS — R471 Dysarthria and anarthria: Secondary | ICD-10-CM | POA: Diagnosis not present

## 2021-12-16 DIAGNOSIS — R1312 Dysphagia, oropharyngeal phase: Secondary | ICD-10-CM | POA: Diagnosis not present

## 2021-12-16 DIAGNOSIS — R41841 Cognitive communication deficit: Secondary | ICD-10-CM | POA: Diagnosis not present

## 2021-12-16 DIAGNOSIS — M6281 Muscle weakness (generalized): Secondary | ICD-10-CM

## 2021-12-16 DIAGNOSIS — G20C Parkinsonism, unspecified: Secondary | ICD-10-CM | POA: Diagnosis not present

## 2021-12-16 NOTE — Therapy (Signed)
[facility] OUTPATIENT PHYSICAL THERAPY NEURO TREATMENT   Patient Name: Joseph Arroyo MRN: [id number] DOB:[birth year], 81 y.o., male Today's Date: 12/16/2021   PCP: Olin Hauser, DO REFERRING PROVIDER: Lavena Bullion, NP   PT End of Session - 12/16/21 1429     Visit Number 25    Number of Visits 88    Date for PT Re-Evaluation 02/05/22    Authorization Type [employer]    Authorization Time Period Initial Cert 0/12/2723- 3/66/4403; Recert 4/74/2595-63/10/7562; PN 11/27/21    Progress Note Due on Visit 20    PT Start Time 1430    PT Stop Time 1515    PT Time Calculation (min) 45 min    Equipment Utilized During Treatment Gait belt    Activity Tolerance Patient tolerated treatment well    Behavior During Therapy [facility] for tasks assessed/performed                  Past Medical History:  Diagnosis Date   Allergic rhinitis due to pollen 11/21/2007   Brachial neuritis or radiculitis NOS    Cervicalgia    Concussion    age 33 - s/p accident   Costal chondritis    Depression    Essential and other specified forms of tremor    GERD (gastroesophageal reflux disease)    in past   Lumbago    Occlusion and stenosis of carotid artery without mention of cerebral infarction    Seizures [facility])    age 68 - after concussion   Past Surgical History:  Procedure Laterality Date   CATARACT EXTRACTION Right 07/18/14   CATARACT EXTRACTION W/PHACO Left 08/01/2014   Procedure: CATARACT EXTRACTION PHACO AND INTRAOCULAR LENS PLACEMENT ([place]);  Surgeon: Leandrew Koyanagi, MD;  Location: [place];  Service: Ophthalmology;  Laterality: Left;  IVA TOPICAL   COLONOSCOPY  2008   OTHER SURGICAL HISTORY  2002   ear surgery   STAPEDES SURGERY Right 2002   Patient Active Problem List   Diagnosis Date Noted   Dysphagia, pharyngeal phase 09/17/2021   Cervicalgia 08/05/2021   Spondylosis without myelopathy or radiculopathy, lumbosacral region 06/24/2021   Lumbosacral facet  hypertrophy 06/03/2021   Lumbar central spinal stenosis, w/o neurogenic claudication (L4-5) 06/03/2021   Lumbar lateral recess stenosis (Bilateral: L2-3, L4-5) (Right: L3-4) 06/03/2021   Lumbosacral foraminal stenosis (Bilateral: L2-3, L3-4) (Left: L5-S1) 06/03/2021   Lumbar nerve root impingement (Right: L3 at L2-3 & L3-4) 06/03/2021   Ligamentum flavum hypertrophy (L3-4, L4-5) 06/03/2021   Abnormal MRI, lumbar spine (04/23/2021) 04/24/2021   Long term prescription benzodiazepine use (alprazolam) (Xanax) 03/24/2021   Grade 1 Retrolisthesis of L2/L3 (5 mm) and L3/L4 (3 mm) 03/24/2021   Levoscoliosis of lumbar spine (L3-4 apex) 03/24/2021   DDD (degenerative disc disease), lumbosacral 03/24/2021   Lumbosacral facet arthropathy (Left: L3-4, L4-5, and L5-S1) 03/24/2021   Tricompartment osteoarthritis of knee (Left) 03/24/2021   Baker cyst (Left) 03/24/2021   Chronic low back pain (1ry area of Pain) (Bilateral) (R>L) w/o sciatica 03/24/2021   Lumbar facet syndrome (Bilateral) 03/24/2021   Chronic lower extremity pain (2ry area of Pain) (Bilateral) (L>R) 03/24/2021   Lumbosacral radiculitis/sensory radiculopathy at L2 (Bilateral) 03/24/2021   Lumbosacral radiculitis/sensory radiculopathy at L3 (Bilateral) 03/24/2021   Chronic pain syndrome 03/23/2021   Pharmacologic therapy 03/23/2021   Disorder of skeletal system 03/23/2021   Problems influencing health status 03/23/2021   PAD (peripheral artery disease) ([place]) 10/01/2020   GAD (generalized anxiety disorder) 01/13/2018   MDD (major depressive disorder)  81/19/1478   Umbilical hernia 29/56/2130   Chronic knee pain (Left) 08/11/2017   Derangement of medial meniscus, posterior horn (Left) 08/11/2017   Vitamin D insufficiency 03/05/2016   BPH without obstruction/lower urinary tract symptoms 07/08/2015   Chronic fatigue 10/23/2014   Parkinson's disease ([place]) 09/20/2014   Major depression, recurrent, full remission ([place]) 07/26/2013   Unsteady  gait 07/26/2013   Orthostatic hypotension 07/26/2013   Depression 07/26/2013   Anxiety 06/23/2013   Chronic anxiety 06/23/2013   Tremor 05/18/2013   Bilateral carotid artery stenosis 08/30/2012    ONSET DATE: 08/11/2021  REFERRING DIAG: Parkinsons Disease  THERAPY DIAG:  Muscle weakness (generalized)  Difficulty in walking, not elsewhere classified  Unsteadiness on feet  Abnormality of gait and mobility  Atypical parkinsonism  Other lack of coordination  Other abnormalities of gait and mobility  Rationale for Evaluation and Treatment Rehabilitation  SUBJECTIVE:                                                                                                                                                                                            SUBJECTIVE STATEMENT:  Pt reports feeling okay today. States still working with MD and trying to get insurance to approve his procedure to help with his chronic low back pain. .  Pt accompanied by: significant other  PERTINENT HISTORY: Per chart history provided by Dr. Consuela Mimes- Mr. Damon has Parkinson's disease ([place]); Chronic knee pain (Left); Derangement of medial meniscus, posterior horn (Left); PAD (peripheral artery disease) ([place]); Chronic pain syndrome; Grade 1 Retrolisthesis of L2/L3 (5 mm) and L3/L4 (3 mm); Levoscoliosis of lumbar spine (L3-4 apex); DDD (degenerative disc disease), lumbosacral; Lumbosacral facet arthropathy (Left: L3-4, L4-5, and L5-S1); Tricompartment osteoarthritis of knee (Left); Baker cyst (Left); Chronic low back pain (1ry area of Pain) (Bilateral) (R>L) w/o sciatica; Lumbar facet syndrome (Bilateral); Chronic lower extremity pain (2ry area of Pain) (Bilateral) (L>R); Lumbosacral radiculitis/sensory radiculopathy at L2 (Bilateral); Lumbosacral radiculitis/sensory radiculopathy at L3 (Bilateral); Abnormal MRI, lumbar spine (04/23/2021); Lumbosacral facet hypertrophy; Lumbar central spinal stenosis, w/o  neurogenic claudication (L4-5); Lumbar lateral recess stenosis (Bilateral: L2-3, L4-5) (Right: L3-4); Lumbosacral foraminal stenosis (Bilateral: L2-3, L3-4) (Left: L5-S1); Lumbar nerve root impingement (Right: L3 at L2-3 & L3-4); Ligamentum flavum hypertrophy (L3-4, L4-5); Spondylosis without myelopathy or radiculopathy, lumbosacral region; and Cervicalgia on their pertinent problem list   Patient has appointment for Epidural to low back next Tues 08/26/2021  PAIN:  Are you having pain? Yes: NPRS scale: 4/10 Pain location: low back Pain description: ache/soreness Aggravating factors: prolonged standing, transfers, walking Relieving factors: rest and medications  PRECAUTIONS: Fall  WEIGHT BEARING RESTRICTIONS No  FALLS: Has patient fallen in last 6 months? Yes. Number of falls 2  LIVING ENVIRONMENT: Lives with: lives with their spouse Lives in: House/apartment Stairs: 4 Has following equipment at home: Walker - 2 wheeled  PLOF: Needs assistance with ADLs- intermittent  PATIENT GOALS  to walk better and be as active as possible. Also to get in/out of cars better.   OBJECTIVE: (objective measures completed at initial evaluation unless otherwise dated)   DIAGNOSTIC FINDINGS: IMPRESSION: 1. Severe lumbar levoscoliosis with widespread advanced disc and facet degeneration. 2. Mild spinal stenosis at L4-5. 3. Moderate neural foraminal stenosis on the right at L3-4 and on the left at L5-S1. 4. Moderate right lateral recess stenosis at L2-3.      LOWER EXTREMITY MMT:    MMT (evaluation)  Right Eval Left Eval  Hip flexion 4 4  Hip extension 4 4  Hip abduction 4 4  Hip adduction 4 4  Hip internal rotation 4 4  Hip external rotation 4 4  Knee flexion 4 4  Knee extension 4 4  Ankle dorsiflexion 4 4  Ankle plantarflexion 4 4  Ankle inversion 4 4  Ankle eversion 4 4  (Blank rows = not tested)     FUNCTIONAL TESTS (EVALUATION):  5 times sit to stand: 14 sec without UE  support Timed up and go (TUG): 15.67 sec with RW 10 meter walk test: 10.67 sec = 0.94 m/s using RW Berg Balance Scale: 48/56- see flowsheet for details  PATIENT SURVEYS:  FOTO : 49 8/24: 47  TODAY'S TREATMENT:   Heat applied to patient's lower back region for all seated rest breaks as well as seated therapeutic exercises  NMR:   CGA for safety unless otherwise stated: Alternating lateral lunging with shoulder ABD x15 ea Forward walking in // bars (5 step to end of bar) - repeat 10x focusing on maintaining large steps Retro walking in // bars (10 steps to end of bar) repeat 10 x focusing on maintaining large steps Standing step over 1/2 foam and back x 12 reps alt LE   THEREX:   Seated hamstring stretch x 30 sec x 4 each LE 60 seconds  Standing marches 20x each LE Seated lateral step over and back 10x each side  Seated LAQ with 3 sec hold x 15 reps each LE Standing QL push for erect spine position - hold 5 sec x 10 reps (in front of mirror in // bars for feedback) Standing lateral spine mobility, 2x10 to the R  Standing hip abduction, 2x10 each LE Standing hip extension, 2x10 each LE Standing lateral trunk shift for correction of posture, 2x10 to the R (R UE supporting on wall, L hand placed on iliac crest and pushing towards the R side)   PATIENT EDUCATION: Education details: Pt educated throughout session about proper posture and technique with exercises. Improved exercise technique, movement at target joints, use of target muscles after min to mod verbal, visual, tactile cues.  Person educated: Patient Education method: Explanation, Demonstration, Tactile cues, and Verbal cues Education comprehension: verbalized understanding, returned demonstration, verbal cues required, tactile cues required, and needs further education   HOME EXERCISE PROGRAM: No updates on this date, pt to continue HEP as previously given: PWR! Basic 4 seated x 10 ea, handout provided: addended to  not perform seated PRW! Rock exercise and added the below to program on 09/25/21  Access Code: [email or web address] URL: [email or web address] Date: 09/25/2021 Prepared by: Rivka Barbara  Exercises - Standing Romberg to 3/4 Tandem Stance  -  1 x daily - 7 x weekly - 2 sets - 30 seocnd hold - Standing March with Counter Support  - 1 x daily - 7 x weekly - 2 sets - 10 reps - Modified Single Leg Balance on Step  - 1 x daily - 7 x weekly - 2 sets - 30 second hold  Access Code: [email or web address] URL: [email or web address] Date: 10/21/2021 Prepared by: Janna Arch  Exercises - Seated Abdominal Press into [employer]  - 1 x daily - 7 x weekly - 2 sets - 10 reps - 5 hold - Seated Flexion Stretch with Swiss Ball  - 1 x daily - 7 x weekly - 2 sets - 10 reps - 5 hold  Access Code: [email or web address] URL: [email or web address] Date: 12/04/2021 Prepared by: Janna Arch  Exercises - Seated Hamstring Stretch  - 1 x daily - 7 x weekly - 2 sets - 2 reps - 30 hold - Standing Hamstring Stretch with Step  - 1 x daily - 7 x weekly - 2 sets - 2 reps - 30 hold  Access Code: [email or web address] URL: [email or web address] Date: 12/09/2021 Prepared by: Amalia Hailey  Exercises - Seated Alternating Side Stretch with Arm Overhead  - 1 x daily - 7 x weekly - 2 sets - 10 reps - Seated Thoracic Extension Arms Overhead  - 1 x daily - 7 x weekly - 2 sets - 10 reps  GOALS: Goals reviewed with patient? Yes  SHORT TERM GOALS: Target date: 10/02/2021  Pt will be independent with initial HEP in order to improve strength and balance in order to decrease fall risk and improve function at home and work.  Baseline: 08/21/2021-No formal HEP in place Goal status: MET    LONG TERM GOALS: Target date: 02/05/2022  Pt will improve BERG by at least 3 points in order to demonstrate clinically significant improvement in balance.    Baseline: 08/21/2021= 48/56 8/24:47/56; 11/13/2021= 47/56 Goal status:  ongoing  2.  Pt will be independent with final HEP in order to improve strength and balance in order to decrease fall risk and improve function at home and work.  Baseline: No HEP in place, 8/24: completing current HEP but still progressing; 11/13/2021- Patient continues to verbalize and demonstrate understanding of HEP for stretching/strengthening and mobility but still progressing and altering program as appropriate Goal status: IN PROGRESS  3.  Pt will decrease 5TSTS by at least 4 seconds in order to demonstrate clinically significant improvement in LE strength. Baseline: 08/21/2021= 19.01 sec without UE support 8/24:10.56 sec Goal status: MET  4.  Pt will decrease TUG to below 14 seconds/decrease in order to demonstrate decreased fall risk. Baseline:  08/21/2021=15.67 with RW 8/24: 12.41 Goal status: MET  5.  Pt will improve FOTO to target score of 56 to display perceived improvements in ability to complete ADL's.  Baseline: 08/21/2021=49 8/24: 45.6; 11/13/2021=46 Goal status: IN PROGRESS  6.  Pt will increase 10MWT by at least 0.15 m/s in order to demonstrate clinically significant improvement in community ambulation.   Baseline: 08/22/2021=0.94 m/s 8/24:1.38ms Goal status: MET  7 Pt will improve miniBESTest by at least 4 points to demonstrate clinically significant improvement in reduced falls with parkinsonian related deficits.  Baseline: 19/28; 11/13/2021- Unable to test due to time constraint- will test next session and update goal. 9/19: 18/28  Goal Status: ONGOING  8. Pt will increase 6MWT by at least [id number] order to demonstrate clinically significant improvement in cardiopulmonary endurance and community ambulation  Baseline: 11/13/2021=  650 feet with walker Goal Status: NEW  ASSESSMENT:  CLINICAL IMPRESSION:   Pt performed therapeutic exercises and was able to increase tolerance of exercises and the introduction of lateral trunk shift to correct posture.  Pt noted that  his back was beginning to increase in pain at conclusion of therapy session, so he sat with moist heat pad to alleviate the symptoms.  Pt encouraged to continue with exercises in order to improve overall function and mobility going forward.   Pt will continue to benefit from skilled therapy to address remaining deficits in order to improve overall QoL and return to PLOF.     OBJECTIVE IMPAIRMENTS Abnormal gait, decreased activity tolerance, decreased balance, decreased coordination, decreased endurance, decreased knowledge of use of DME, decreased mobility, difficulty walking, decreased ROM, decreased strength, hypomobility, and impaired flexibility.   ACTIVITY LIMITATIONS carrying, lifting, bending, standing, squatting, sleeping, stairs, transfers, continence, bathing, toileting, and dressing  PARTICIPATION LIMITATIONS: cleaning, laundry, medication management, driving, shopping, community activity, and yard work  PERSONAL FACTORS Age are also affecting patient's functional outcome.   REHAB POTENTIAL: Good  CLINICAL DECISION MAKING: Stable/uncomplicated  EVALUATION COMPLEXITY: Low  PLAN: PT FREQUENCY: 2x/week  PT DURATION: 12 weeks  PLANNED INTERVENTIONS: Therapeutic exercises, Therapeutic activity, Neuromuscular re-education, Balance training, Gait training, Patient/Family education, Joint mobilization, Stair training, Vestibular training, Canalith repositioning, DME instructions, Dry Needling, Electrical stimulation, Spinal manipulation, and Spinal mobilization  PLAN FOR NEXT SESSION: dynamic cross body movements, continue balance training, monitor hypotension and orthostatics, ROM for flexibility, continue plan   Gwenlyn Saran, PT, DPT 12/16/21, 2:29 PM Physical Therapist - [facility]

## 2021-12-18 ENCOUNTER — Ambulatory Visit: Payer: Medicare HMO

## 2021-12-18 ENCOUNTER — Encounter: Payer: Medicare HMO | Admitting: Speech Pathology

## 2021-12-18 DIAGNOSIS — R2681 Unsteadiness on feet: Secondary | ICD-10-CM

## 2021-12-18 DIAGNOSIS — R41841 Cognitive communication deficit: Secondary | ICD-10-CM | POA: Diagnosis not present

## 2021-12-18 DIAGNOSIS — R1312 Dysphagia, oropharyngeal phase: Secondary | ICD-10-CM | POA: Diagnosis not present

## 2021-12-18 DIAGNOSIS — M6281 Muscle weakness (generalized): Secondary | ICD-10-CM | POA: Diagnosis not present

## 2021-12-18 DIAGNOSIS — G20C Parkinsonism, unspecified: Secondary | ICD-10-CM | POA: Diagnosis not present

## 2021-12-18 DIAGNOSIS — R278 Other lack of coordination: Secondary | ICD-10-CM | POA: Diagnosis not present

## 2021-12-18 DIAGNOSIS — R269 Unspecified abnormalities of gait and mobility: Secondary | ICD-10-CM

## 2021-12-18 DIAGNOSIS — R2689 Other abnormalities of gait and mobility: Secondary | ICD-10-CM

## 2021-12-18 DIAGNOSIS — R471 Dysarthria and anarthria: Secondary | ICD-10-CM | POA: Diagnosis not present

## 2021-12-18 DIAGNOSIS — R262 Difficulty in walking, not elsewhere classified: Secondary | ICD-10-CM

## 2021-12-18 DIAGNOSIS — R490 Dysphonia: Secondary | ICD-10-CM | POA: Diagnosis not present

## 2021-12-18 NOTE — Therapy (Signed)
[facility] OUTPATIENT PHYSICAL THERAPY NEURO TREATMENT   Patient Name: Joseph Arroyo MRN: [id number] DOB:19-Jan-1941, 81 y.o., male Today's Date: 12/18/2021   PCP: Olin Hauser, DO REFERRING PROVIDER: Lavena Bullion, NP   PT End of Session - 12/18/21 1559     Visit Number 26    Number of Visits 82    Date for PT Re-Evaluation 02/05/22    Authorization Type [employer]    Authorization Time Period Initial Cert 1/77/9390- 3/00/9233; Recert 0/08/6224-33/05/5454; PN 11/27/21    Progress Note Due on Visit 6    PT Start Time 1558    PT Stop Time 1637    PT Time Calculation (min) 39 min    Equipment Utilized During Treatment Gait belt    Activity Tolerance Patient tolerated treatment well    Behavior During Therapy WFL for tasks assessed/performed                  Past Medical History:  Diagnosis Date   Allergic rhinitis due to pollen 11/21/2007   Brachial neuritis or radiculitis NOS    Cervicalgia    Concussion    age 79 - s/p accident   Costal chondritis    Depression    Essential and other specified forms of tremor    GERD (gastroesophageal reflux disease)    in past   Lumbago    Occlusion and stenosis of carotid artery without mention of cerebral infarction    Seizures [employer])    age 26 - after concussion   Past Surgical History:  Procedure Laterality Date   CATARACT EXTRACTION Right 07/18/14   CATARACT EXTRACTION W/PHACO Left 08/01/2014   Procedure: CATARACT EXTRACTION PHACO AND INTRAOCULAR LENS PLACEMENT ([place]);  Surgeon: Leandrew Koyanagi, MD;  Location: [facility];  Service: Ophthalmology;  Laterality: Left;  IVA TOPICAL   COLONOSCOPY  2008   OTHER SURGICAL HISTORY  2002   ear surgery   STAPEDES SURGERY Right 2002   Patient Active Problem List   Diagnosis Date Noted   Dysphagia, pharyngeal phase 09/17/2021   Cervicalgia 08/05/2021   Spondylosis without myelopathy or radiculopathy, lumbosacral region 06/24/2021   Lumbosacral facet  hypertrophy 06/03/2021   Lumbar central spinal stenosis, w/o neurogenic claudication (L4-5) 06/03/2021   Lumbar lateral recess stenosis (Bilateral: L2-3, L4-5) (Right: L3-4) 06/03/2021   Lumbosacral foraminal stenosis (Bilateral: L2-3, L3-4) (Left: L5-S1) 06/03/2021   Lumbar nerve root impingement (Right: L3 at L2-3 & L3-4) 06/03/2021   Ligamentum flavum hypertrophy (L3-4, L4-5) 06/03/2021   Abnormal MRI, lumbar spine (04/23/2021) 04/24/2021   Long term prescription benzodiazepine use (alprazolam) (Xanax) 03/24/2021   Grade 1 Retrolisthesis of L2/L3 (5 mm) and L3/L4 (3 mm) 03/24/2021   Levoscoliosis of lumbar spine (L3-4 apex) 03/24/2021   DDD (degenerative disc disease), lumbosacral 03/24/2021   Lumbosacral facet arthropathy (Left: L3-4, L4-5, and L5-S1) 03/24/2021   Tricompartment osteoarthritis of knee (Left) 03/24/2021   Baker cyst (Left) 03/24/2021   Chronic low back pain (1ry area of Pain) (Bilateral) (R>L) w/o sciatica 03/24/2021   Lumbar facet syndrome (Bilateral) 03/24/2021   Chronic lower extremity pain (2ry area of Pain) (Bilateral) (L>R) 03/24/2021   Lumbosacral radiculitis/sensory radiculopathy at L2 (Bilateral) 03/24/2021   Lumbosacral radiculitis/sensory radiculopathy at L3 (Bilateral) 03/24/2021   Chronic pain syndrome 03/23/2021   Pharmacologic therapy 03/23/2021   Disorder of skeletal system 03/23/2021   Problems influencing health status 03/23/2021   PAD (peripheral artery disease) ([place]) 10/01/2020   GAD (generalized anxiety disorder) 01/13/2018   MDD (major depressive disorder)  14/78/2956   Umbilical hernia 21/30/8657   Chronic knee pain (Left) 08/11/2017   Derangement of medial meniscus, posterior horn (Left) 08/11/2017   Vitamin D insufficiency 03/05/2016   BPH without obstruction/lower urinary tract symptoms 07/08/2015   Chronic fatigue 10/23/2014   Parkinson's disease ([place]) 09/20/2014   Major depression, recurrent, full remission ([place]) 07/26/2013   Unsteady  gait 07/26/2013   Orthostatic hypotension 07/26/2013   Depression 07/26/2013   Anxiety 06/23/2013   Chronic anxiety 06/23/2013   Tremor 05/18/2013   Bilateral carotid artery stenosis 08/30/2012    ONSET DATE: 08/11/2021  REFERRING DIAG: Parkinsons Disease  THERAPY DIAG:  Muscle weakness (generalized)  Difficulty in walking, not elsewhere classified  Unsteadiness on feet  Abnormality of gait and mobility  Atypical parkinsonism  Other lack of coordination  Other abnormalities of gait and mobility  Rationale for Evaluation and Treatment Rehabilitation  SUBJECTIVE:                                                                                                                                                                                            SUBJECTIVE STATEMENT:  Pt reports feeling okay today. States he is now scheduled for his back procedure next Tues and will cancel his visit next Wednesday.  Pt accompanied by: significant other  PERTINENT HISTORY: Per chart history provided by Dr. Consuela Mimes- Mr. Claire has Parkinson's disease ([place]); Chronic knee pain (Left); Derangement of medial meniscus, posterior horn (Left); PAD (peripheral artery disease) ([place]); Chronic pain syndrome; Grade 1 Retrolisthesis of L2/L3 (5 mm) and L3/L4 (3 mm); Levoscoliosis of lumbar spine (L3-4 apex); DDD (degenerative disc disease), lumbosacral; Lumbosacral facet arthropathy (Left: L3-4, L4-5, and L5-S1); Tricompartment osteoarthritis of knee (Left); Baker cyst (Left); Chronic low back pain (1ry area of Pain) (Bilateral) (R>L) w/o sciatica; Lumbar facet syndrome (Bilateral); Chronic lower extremity pain (2ry area of Pain) (Bilateral) (L>R); Lumbosacral radiculitis/sensory radiculopathy at L2 (Bilateral); Lumbosacral radiculitis/sensory radiculopathy at L3 (Bilateral); Abnormal MRI, lumbar spine (04/23/2021); Lumbosacral facet hypertrophy; Lumbar central spinal stenosis, w/o neurogenic claudication  (L4-5); Lumbar lateral recess stenosis (Bilateral: L2-3, L4-5) (Right: L3-4); Lumbosacral foraminal stenosis (Bilateral: L2-3, L3-4) (Left: L5-S1); Lumbar nerve root impingement (Right: L3 at L2-3 & L3-4); Ligamentum flavum hypertrophy (L3-4, L4-5); Spondylosis without myelopathy or radiculopathy, lumbosacral region; and Cervicalgia on their pertinent problem list   Patient has appointment for Epidural to low back next Tues 08/26/2021  PAIN:  Are you having pain? Yes: NPRS scale: 4/10 Pain location: low back Pain description: ache/soreness Aggravating factors: prolonged standing, transfers, walking Relieving factors: rest and medications  PRECAUTIONS: Fall  WEIGHT BEARING RESTRICTIONS No  FALLS: Has patient fallen  in last 6 months? Yes. Number of falls 2  LIVING ENVIRONMENT: Lives with: lives with their spouse Lives in: House/apartment Stairs: 4 Has following equipment at home: Walker - 2 wheeled  PLOF: Needs assistance with ADLs- intermittent  PATIENT GOALS  to walk better and be as active as possible. Also to get in/out of cars better.   OBJECTIVE: (objective measures completed at initial evaluation unless otherwise dated)   DIAGNOSTIC FINDINGS: IMPRESSION: 1. Severe lumbar levoscoliosis with widespread advanced disc and facet degeneration. 2. Mild spinal stenosis at L4-5. 3. Moderate neural foraminal stenosis on the right at L3-4 and on the left at L5-S1. 4. Moderate right lateral recess stenosis at L2-3.      LOWER EXTREMITY MMT:    MMT (evaluation)  Right Eval Left Eval  Hip flexion 4 4  Hip extension 4 4  Hip abduction 4 4  Hip adduction 4 4  Hip internal rotation 4 4  Hip external rotation 4 4  Knee flexion 4 4  Knee extension 4 4  Ankle dorsiflexion 4 4  Ankle plantarflexion 4 4  Ankle inversion 4 4  Ankle eversion 4 4  (Blank rows = not tested)     FUNCTIONAL TESTS (EVALUATION):  5 times sit to stand: 14 sec without UE support Timed up and go  (TUG): 15.67 sec with RW 10 meter walk test: 10.67 sec = 0.94 m/s using RW Berg Balance Scale: 48/56- see flowsheet for details  PATIENT SURVEYS:  FOTO : 49 8/24: 47  TODAY'S TREATMENT:   Heat applied to patient's lower back region for all seated rest breaks as well as seated therapeutic exercises  NMR:   CGA for safety unless otherwise stated:  Forward walking in // bars (5 step to end of bar) - repeat 10x focusing on maintaining large steps Retro walking in // bars (7 steps to end of bar) repeat 10 x focusing on maintaining large steps (patient reported increased low back pain at end)  Standing step over 1/2 foam and back x 12 reps alt LE   THEREX:   Seated hamstring stretch x 30 sec x 4 each LE 60 seconds  Standing marches 20x each LE Seated lateral step over and back 10x each side  Seated LAQ with 3 sec hold x 15 reps each LE Seated (long sit) quad sets x 12 reps each LE    Standing hip abduction, 2x10 each LE Standing donkey kick with 3lb AW, 2x10 each LE Standing calf raises '@3lb'  AW - x 15 reps BLE   PATIENT EDUCATION: Education details: Pt educated throughout session about proper posture and technique with exercises. Improved exercise technique, movement at target joints, use of target muscles after min to mod verbal, visual, tactile cues.  Person educated: Patient Education method: Explanation, Demonstration, Tactile cues, and Verbal cues Education comprehension: verbalized understanding, returned demonstration, verbal cues required, tactile cues required, and needs further education   HOME EXERCISE PROGRAM: No updates on this date, pt to continue HEP as previously given: PWR! Basic 4 seated x 10 ea, handout provided: addended to not perform seated PRW! Rock exercise and added the below to program on 09/25/21  Access Code: [email or web address] URL: [email or web address] Date: 09/25/2021 Prepared by: Rivka Barbara  Exercises - Standing Romberg to 3/4  Tandem Stance  - 1 x daily - 7 x weekly - 2 sets - 30 seocnd hold - Standing March with Counter Support  - 1 x daily - 7 x weekly - 2 sets - 10 reps -  Modified Single Leg Balance on Step  - 1 x daily - 7 x weekly - 2 sets - 30 second hold  Access Code: [email or web address] URL: [email or web address] Date: 10/21/2021 Prepared by: Janna Arch  Exercises - Seated Abdominal Press into [employer]  - 1 x daily - 7 x weekly - 2 sets - 10 reps - 5 hold - Seated Flexion Stretch with Swiss Ball  - 1 x daily - 7 x weekly - 2 sets - 10 reps - 5 hold  Access Code: [email or web address] URL: [email or web address] Date: 12/04/2021 Prepared by: Janna Arch  Exercises - Seated Hamstring Stretch  - 1 x daily - 7 x weekly - 2 sets - 2 reps - 30 hold - Standing Hamstring Stretch with Step  - 1 x daily - 7 x weekly - 2 sets - 2 reps - 30 hold  Access Code: [email or web address] URL: [email or web address] Date: 12/09/2021 Prepared by: Amalia Hailey  Exercises - Seated Alternating Side Stretch with Arm Overhead  - 1 x daily - 7 x weekly - 2 sets - 10 reps - Seated Thoracic Extension Arms Overhead  - 1 x daily - 7 x weekly - 2 sets - 10 reps  GOALS: Goals reviewed with patient? Yes  SHORT TERM GOALS: Target date: 10/02/2021  Pt will be independent with initial HEP in order to improve strength and balance in order to decrease fall risk and improve function at home and work.  Baseline: 08/21/2021-No formal HEP in place Goal status: MET    LONG TERM GOALS: Target date: 02/05/2022  Pt will improve BERG by at least 3 points in order to demonstrate clinically significant improvement in balance.    Baseline: 08/21/2021= 48/56 8/24:47/56; 11/13/2021= 47/56 Goal status: ongoing  2.  Pt will be independent with final HEP in order to improve strength and balance in order to decrease fall risk and improve function at home and work.  Baseline: No HEP in place, 8/24: completing current HEP but still  progressing; 11/13/2021- Patient continues to verbalize and demonstrate understanding of HEP for stretching/strengthening and mobility but still progressing and altering program as appropriate Goal status: IN PROGRESS  3.  Pt will decrease 5TSTS by at least 4 seconds in order to demonstrate clinically significant improvement in LE strength. Baseline: 08/21/2021= 19.01 sec without UE support 8/24:10.56 sec Goal status: MET  4.  Pt will decrease TUG to below 14 seconds/decrease in order to demonstrate decreased fall risk. Baseline:  08/21/2021=15.67 with RW 8/24: 12.41 Goal status: MET  5.  Pt will improve FOTO to target score of 56 to display perceived improvements in ability to complete ADL's.  Baseline: 08/21/2021=49 8/24: 45.6; 11/13/2021=46 Goal status: IN PROGRESS  6.  Pt will increase 10MWT by at least 0.15 m/s in order to demonstrate clinically significant improvement in community ambulation.   Baseline: 08/22/2021=0.94 m/s 8/24:1.8ms Goal status: MET  7 Pt will improve miniBESTest by at least 4 points to demonstrate clinically significant improvement in reduced falls with parkinsonian related deficits.  Baseline: 19/28; 11/13/2021- Unable to test due to time constraint- will test next session and update goal. 9/19: 18/28  Goal Status: ONGOING  8. Pt will increase 6MWT by at least [id number] order to demonstrate clinically significant improvement in cardiopulmonary endurance and community ambulation  Baseline: 11/13/2021= 650 feet with walker Goal Status: NEW  ASSESSMENT:  CLINICAL IMPRESSION:   Treatment continued to focus on LE strength as appropriate. Exercises were modified and modalities including heat were used to prevent excessive back  pain. Patient performed well with all seated there but again began to report some increase in pain at conclusion of therapy session with standing activities- so terminated session a few min early. Patient has pain procedure scheduled for next  Tues. Pt encouraged to continue with exercises in order to improve overall function and mobility going forward.   Pt will continue to benefit from skilled therapy to address remaining deficits in order to improve overall QoL and return to PLOF.     OBJECTIVE IMPAIRMENTS Abnormal gait, decreased activity tolerance, decreased balance, decreased coordination, decreased endurance, decreased knowledge of use of DME, decreased mobility, difficulty walking, decreased ROM, decreased strength, hypomobility, and impaired flexibility.   ACTIVITY LIMITATIONS carrying, lifting, bending, standing, squatting, sleeping, stairs, transfers, continence, bathing, toileting, and dressing  PARTICIPATION LIMITATIONS: cleaning, laundry, medication management, driving, shopping, community activity, and yard work  PERSONAL FACTORS Age are also affecting patient's functional outcome.   REHAB POTENTIAL: Good  CLINICAL DECISION MAKING: Stable/uncomplicated  EVALUATION COMPLEXITY: Low  PLAN: PT FREQUENCY: 2x/week  PT DURATION: 12 weeks  PLANNED INTERVENTIONS: Therapeutic exercises, Therapeutic activity, Neuromuscular re-education, Balance training, Gait training, Patient/Family education, Joint mobilization, Stair training, Vestibular training, Canalith repositioning, DME instructions, Dry Needling, Electrical stimulation, Spinal manipulation, and Spinal mobilization  PLAN FOR NEXT SESSION: dynamic cross body movements, continue balance training, monitor hypotension and orthostatics, ROM for flexibility, continue plan   Ollen Bowl, PT 12/18/21, 4:58 PM Physical Therapist - [facility]

## 2021-12-22 ENCOUNTER — Ambulatory Visit: Payer: Medicare HMO

## 2021-12-22 ENCOUNTER — Encounter: Payer: Medicare HMO | Admitting: Occupational Therapy

## 2021-12-22 DIAGNOSIS — R269 Unspecified abnormalities of gait and mobility: Secondary | ICD-10-CM | POA: Diagnosis not present

## 2021-12-22 DIAGNOSIS — R471 Dysarthria and anarthria: Secondary | ICD-10-CM | POA: Diagnosis not present

## 2021-12-22 DIAGNOSIS — R1312 Dysphagia, oropharyngeal phase: Secondary | ICD-10-CM | POA: Diagnosis not present

## 2021-12-22 DIAGNOSIS — G20C Parkinsonism, unspecified: Secondary | ICD-10-CM

## 2021-12-22 DIAGNOSIS — R2689 Other abnormalities of gait and mobility: Secondary | ICD-10-CM | POA: Diagnosis not present

## 2021-12-22 DIAGNOSIS — R41841 Cognitive communication deficit: Secondary | ICD-10-CM | POA: Diagnosis not present

## 2021-12-22 DIAGNOSIS — R262 Difficulty in walking, not elsewhere classified: Secondary | ICD-10-CM

## 2021-12-22 DIAGNOSIS — R490 Dysphonia: Secondary | ICD-10-CM | POA: Diagnosis not present

## 2021-12-22 DIAGNOSIS — R278 Other lack of coordination: Secondary | ICD-10-CM | POA: Diagnosis not present

## 2021-12-22 DIAGNOSIS — R2681 Unsteadiness on feet: Secondary | ICD-10-CM

## 2021-12-22 DIAGNOSIS — M6281 Muscle weakness (generalized): Secondary | ICD-10-CM | POA: Diagnosis not present

## 2021-12-22 NOTE — Therapy (Signed)
[facility] OUTPATIENT PHYSICAL THERAPY NEURO TREATMENT   Patient Name: Joseph Arroyo MRN: [id number] DOB:1940/05/10, 81 y.o., male Today's Date: 12/22/2021   PCP: Olin Hauser, DO REFERRING PROVIDER: Lavena Bullion, NP   PT End of Session - 12/22/21 1639     Visit Number 27    Number of Visits 54    Date for PT Re-Evaluation 02/05/22    Authorization Type [employer]    Authorization Time Period Initial Cert 04/03/5425- 0/62/3762; Recert 10/31/5174-16/0/7371; PN 11/27/21    Progress Note Due on Visit 60    PT Start Time 1601    PT Stop Time 1645    PT Time Calculation (min) 44 min    Equipment Utilized During Treatment Gait belt    Activity Tolerance Patient tolerated treatment well    Behavior During Therapy WFL for tasks assessed/performed                  Past Medical History:  Diagnosis Date   Allergic rhinitis due to pollen 11/21/2007   Brachial neuritis or radiculitis NOS    Cervicalgia    Concussion    age 75 - s/p accident   Costal chondritis    Depression    Essential and other specified forms of tremor    GERD (gastroesophageal reflux disease)    in past   Lumbago    Occlusion and stenosis of carotid artery without mention of cerebral infarction    Seizures [facility])    age 39 - after concussion   Past Surgical History:  Procedure Laterality Date   CATARACT EXTRACTION Right 07/18/14   CATARACT EXTRACTION W/PHACO Left 08/01/2014   Procedure: CATARACT EXTRACTION PHACO AND INTRAOCULAR LENS PLACEMENT ([place]);  Surgeon: Leandrew Koyanagi, MD;  Location: [place];  Service: Ophthalmology;  Laterality: Left;  IVA TOPICAL   COLONOSCOPY  2008   OTHER SURGICAL HISTORY  2002   ear surgery   STAPEDES SURGERY Right 2002   Patient Active Problem List   Diagnosis Date Noted   Dysphagia, pharyngeal phase 09/17/2021   Cervicalgia 08/05/2021   Spondylosis without myelopathy or radiculopathy, lumbosacral region 06/24/2021   Lumbosacral facet  hypertrophy 06/03/2021   Lumbar central spinal stenosis, w/o neurogenic claudication (L4-5) 06/03/2021   Lumbar lateral recess stenosis (Bilateral: L2-3, L4-5) (Right: L3-4) 06/03/2021   Lumbosacral foraminal stenosis (Bilateral: L2-3, L3-4) (Left: L5-S1) 06/03/2021   Lumbar nerve root impingement (Right: L3 at L2-3 & L3-4) 06/03/2021   Ligamentum flavum hypertrophy (L3-4, L4-5) 06/03/2021   Abnormal MRI, lumbar spine (04/23/2021) 04/24/2021   Long term prescription benzodiazepine use (alprazolam) (Xanax) 03/24/2021   Grade 1 Retrolisthesis of L2/L3 (5 mm) and L3/L4 (3 mm) 03/24/2021   Levoscoliosis of lumbar spine (L3-4 apex) 03/24/2021   DDD (degenerative disc disease), lumbosacral 03/24/2021   Lumbosacral facet arthropathy (Left: L3-4, L4-5, and L5-S1) 03/24/2021   Tricompartment osteoarthritis of knee (Left) 03/24/2021   Baker cyst (Left) 03/24/2021   Chronic low back pain (1ry area of Pain) (Bilateral) (R>L) w/o sciatica 03/24/2021   Lumbar facet syndrome (Bilateral) 03/24/2021   Chronic lower extremity pain (2ry area of Pain) (Bilateral) (L>R) 03/24/2021   Lumbosacral radiculitis/sensory radiculopathy at L2 (Bilateral) 03/24/2021   Lumbosacral radiculitis/sensory radiculopathy at L3 (Bilateral) 03/24/2021   Chronic pain syndrome 03/23/2021   Pharmacologic therapy 03/23/2021   Disorder of skeletal system 03/23/2021   Problems influencing health status 03/23/2021   PAD (peripheral artery disease) ([place]) 10/01/2020   GAD (generalized anxiety disorder) 01/13/2018   MDD (major depressive disorder)  56/43/3295   Umbilical hernia 18/84/1660   Chronic knee pain (Left) 08/11/2017   Derangement of medial meniscus, posterior horn (Left) 08/11/2017   Vitamin D insufficiency 03/05/2016   BPH without obstruction/lower urinary tract symptoms 07/08/2015   Chronic fatigue 10/23/2014   Parkinson's disease ([place]) 09/20/2014   Major depression, recurrent, full remission ([place]) 07/26/2013   Unsteady  gait 07/26/2013   Orthostatic hypotension 07/26/2013   Depression 07/26/2013   Anxiety 06/23/2013   Chronic anxiety 06/23/2013   Tremor 05/18/2013   Bilateral carotid artery stenosis 08/30/2012    ONSET DATE: 08/11/2021  REFERRING DIAG: Parkinsons Disease  THERAPY DIAG:  Muscle weakness (generalized)  Difficulty in walking, not elsewhere classified  Unsteadiness on feet  Abnormality of gait and mobility  Atypical parkinsonism  Rationale for Evaluation and Treatment Rehabilitation  SUBJECTIVE:                                                                                                                                                                                            SUBJECTIVE STATEMENT:  Pt reports pain at a 4/10 NPS. Has appt tomorrow for facet injections. Has canceled Wednesday appt. No falls.  Pt accompanied by: significant other  PERTINENT HISTORY: Per chart history provided by Dr. Consuela Mimes- Mr. Chicoine has Parkinson's disease ([place]); Chronic knee pain (Left); Derangement of medial meniscus, posterior horn (Left); PAD (peripheral artery disease) ([place]); Chronic pain syndrome; Grade 1 Retrolisthesis of L2/L3 (5 mm) and L3/L4 (3 mm); Levoscoliosis of lumbar spine (L3-4 apex); DDD (degenerative disc disease), lumbosacral; Lumbosacral facet arthropathy (Left: L3-4, L4-5, and L5-S1); Tricompartment osteoarthritis of knee (Left); Baker cyst (Left); Chronic low back pain (1ry area of Pain) (Bilateral) (R>L) w/o sciatica; Lumbar facet syndrome (Bilateral); Chronic lower extremity pain (2ry area of Pain) (Bilateral) (L>R); Lumbosacral radiculitis/sensory radiculopathy at L2 (Bilateral); Lumbosacral radiculitis/sensory radiculopathy at L3 (Bilateral); Abnormal MRI, lumbar spine (04/23/2021); Lumbosacral facet hypertrophy; Lumbar central spinal stenosis, w/o neurogenic claudication (L4-5); Lumbar lateral recess stenosis (Bilateral: L2-3, L4-5) (Right: L3-4); Lumbosacral foraminal  stenosis (Bilateral: L2-3, L3-4) (Left: L5-S1); Lumbar nerve root impingement (Right: L3 at L2-3 & L3-4); Ligamentum flavum hypertrophy (L3-4, L4-5); Spondylosis without myelopathy or radiculopathy, lumbosacral region; and Cervicalgia on their pertinent problem list   Patient has appointment for Epidural to low back next Tues 08/26/2021  PAIN:  Are you having pain? Yes: NPRS scale: 4/10 Pain location: low back Pain description: ache/soreness Aggravating factors: prolonged standing, transfers, walking Relieving factors: rest and medications  PRECAUTIONS: Fall  WEIGHT BEARING RESTRICTIONS No  FALLS: Has patient fallen in last 6 months? Yes. Number of falls 2  LIVING ENVIRONMENT: Lives with: lives with  their spouse Lives in: House/apartment Stairs: 4 Has following equipment at home: [place] - 2 wheeled  PLOF: Needs assistance with ADLs- intermittent  PATIENT GOALS  to walk better and be as active as possible. Also to get in/out of cars better.   OBJECTIVE: (objective measures completed at initial evaluation unless otherwise dated)   DIAGNOSTIC FINDINGS: IMPRESSION: 1. Severe lumbar levoscoliosis with widespread advanced disc and facet degeneration. 2. Mild spinal stenosis at L4-5. 3. Moderate neural foraminal stenosis on the right at L3-4 and on the left at L5-S1. 4. Moderate right lateral recess stenosis at L2-3.      LOWER EXTREMITY MMT:    MMT (evaluation)  Right Eval Left Eval  Hip flexion 4 4  Hip extension 4 4  Hip abduction 4 4  Hip adduction 4 4  Hip internal rotation 4 4  Hip external rotation 4 4  Knee flexion 4 4  Knee extension 4 4  Ankle dorsiflexion 4 4  Ankle plantarflexion 4 4  Ankle inversion 4 4  Ankle eversion 4 4  (Blank rows = not tested)     FUNCTIONAL TESTS (EVALUATION):  5 times sit to stand: 14 sec without UE support Timed up and go (TUG): 15.67 sec with RW 10 meter walk test: 10.67 sec = 0.94 m/s using RW Berg Balance Scale:  48/56- see flowsheet for details  PATIENT SURVEYS:  FOTO : 49 8/24: 47  TODAY'S TREATMENT:   Heat applied to patient's lower back region for all seated rest breaks as well as seated therapeutic exercises  There.ex:  Seated exercise to warm up:    LAQ's: x20/LE   Alt marches: 2x10/LE    Hamstring stretch: 2x60 sec/LE  Neuro Re-ED:   Obstacle course without AD and CGA throughout: x3/course    X3  bolster step overs -->  x2 hurdle step over --> airex pad step up and over   X3  bolster step over --> alternating cone taps x3 pairs  --> hurdle step overs x2 --> airex pad step ups x2 from one airex pad to the other    X1 rep/R and L lateral steps as above.     Standing on airex pad alt cone taps: 2x10/LE. 2 finger support. Seated rest b/t bouts. CGA     Standing on airex with eyes closed: 2x30 sec. Significant ankle strategy. CGA   PATIENT EDUCATION: Education details: Pt educated throughout session about proper posture and technique with exercises. Improved exercise technique, movement at target joints, use of target muscles after min to mod verbal, visual, tactile cues.  Person educated: Patient Education method: Explanation, Demonstration, Tactile cues, and Verbal cues Education comprehension: verbalized understanding, returned demonstration, verbal cues required, tactile cues required, and needs further education   HOME EXERCISE PROGRAM: No updates on this date, pt to continue HEP as previously given: PWR! Basic 4 seated x 10 ea, handout provided: addended to not perform seated PRW! Rock exercise and added the below to program on 09/25/21  Access Code: [email or web address] URL: [email or web address] Date: 09/25/2021 Prepared by: Rivka Barbara  Exercises - Standing Romberg to 3/4 Tandem Stance  - 1 x daily - 7 x weekly - 2 sets - 30 seocnd hold - Standing March with Counter Support  - 1 x daily - 7 x weekly - 2 sets - 10 reps - Modified Single Leg Balance on Step  - 1 x  daily - 7 x weekly - 2 sets - 30 second hold  Access Code: [email or web address] URL: [email or web address] Date:  10/21/2021 Prepared by: Janna Arch  Exercises - Seated Abdominal Press into [employer]  - 1 x daily - 7 x weekly - 2 sets - 10 reps - 5 hold - Seated Flexion Stretch with Swiss Ball  - 1 x daily - 7 x weekly - 2 sets - 10 reps - 5 hold  Access Code: [email or web address] URL: [email or web address] Date: 12/04/2021 Prepared by: Janna Arch  Exercises - Seated Hamstring Stretch  - 1 x daily - 7 x weekly - 2 sets - 2 reps - 30 hold - Standing Hamstring Stretch with Step  - 1 x daily - 7 x weekly - 2 sets - 2 reps - 30 hold  Access Code: [email or web address] URL: [email or web address] Date: 12/09/2021 Prepared by: Amalia Hailey  Exercises - Seated Alternating Side Stretch with Arm Overhead  - 1 x daily - 7 x weekly - 2 sets - 10 reps - Seated Thoracic Extension Arms Overhead  - 1 x daily - 7 x weekly - 2 sets - 10 reps  GOALS: Goals reviewed with patient? Yes  SHORT TERM GOALS: Target date: 10/02/2021  Pt will be independent with initial HEP in order to improve strength and balance in order to decrease fall risk and improve function at home and work.  Baseline: 08/21/2021-No formal HEP in place Goal status: MET    LONG TERM GOALS: Target date: 02/05/2022  Pt will improve BERG by at least 3 points in order to demonstrate clinically significant improvement in balance.    Baseline: 08/21/2021= 48/56 8/24:47/56; 11/13/2021= 47/56 Goal status: ongoing  2.  Pt will be independent with final HEP in order to improve strength and balance in order to decrease fall risk and improve function at home and work.  Baseline: No HEP in place, 8/24: completing current HEP but still progressing; 11/13/2021- Patient continues to verbalize and demonstrate understanding of HEP for stretching/strengthening and mobility but still progressing and altering program as appropriate Goal  status: IN PROGRESS  3.  Pt will decrease 5TSTS by at least 4 seconds in order to demonstrate clinically significant improvement in LE strength. Baseline: 08/21/2021= 19.01 sec without UE support 8/24:10.56 sec Goal status: MET  4.  Pt will decrease TUG to below 14 seconds/decrease in order to demonstrate decreased fall risk. Baseline:  08/21/2021=15.67 with RW 8/24: 12.41 Goal status: MET  5.  Pt will improve FOTO to target score of 56 to display perceived improvements in ability to complete ADL's.  Baseline: 08/21/2021=49 8/24: 45.6; 11/13/2021=46 Goal status: IN PROGRESS  6.  Pt will increase 10MWT by at least 0.15 m/s in order to demonstrate clinically significant improvement in community ambulation.   Baseline: 08/22/2021=0.94 m/s 8/24:1.46ms Goal status: MET  7 Pt will improve miniBESTest by at least 4 points to demonstrate clinically significant improvement in reduced falls with parkinsonian related deficits.  Baseline: 19/28; 11/13/2021- Unable to test due to time constraint- will test next session and update goal. 9/19: 18/28  Goal Status: ONGOING  8. Pt will increase 6MWT by at least [id number] order to demonstrate clinically significant improvement in cardiopulmonary endurance and community ambulation  Baseline: 11/13/2021= 650 feet with walker Goal Status: NEW  ASSESSMENT:  CLINICAL IMPRESSION:  Pt continuing PT POC with focus on strengthening and gait/dynamic balance. Focus of session without AD for improving step lengths, SLS, and unstable surfaces. Pt maintains high motivation levels performing with minimal balance loss. Does have difficulty with SLS requiring frequent attempts to perform cone taps due to poor single limb stance  times. Frequent seated rest breaks performed b/t exercises due to LBP with heat applied.  Pt will continue to benefit from skilled therapy to address remaining deficits in order to improve overall QoL and return to PLOF.     OBJECTIVE IMPAIRMENTS  Abnormal gait, decreased activity tolerance, decreased balance, decreased coordination, decreased endurance, decreased knowledge of use of DME, decreased mobility, difficulty walking, decreased ROM, decreased strength, hypomobility, and impaired flexibility.   ACTIVITY LIMITATIONS carrying, lifting, bending, standing, squatting, sleeping, stairs, transfers, continence, bathing, toileting, and dressing  PARTICIPATION LIMITATIONS: cleaning, laundry, medication management, driving, shopping, community activity, and yard work  PERSONAL FACTORS Age are also affecting patient's functional outcome.   REHAB POTENTIAL: Good  CLINICAL DECISION MAKING: Stable/uncomplicated  EVALUATION COMPLEXITY: Low  PLAN: PT FREQUENCY: 2x/week  PT DURATION: 12 weeks  PLANNED INTERVENTIONS: Therapeutic exercises, Therapeutic activity, Neuromuscular re-education, Balance training, Gait training, Patient/Family education, Joint mobilization, Stair training, Vestibular training, Canalith repositioning, DME instructions, Dry Needling, Electrical stimulation, Spinal manipulation, and Spinal mobilization  PLAN FOR NEXT SESSION: dynamic cross body movements, continue balance training, monitor hypotension and orthostatics, ROM for flexibility, continue plan   Salem Caster. Fairly IV, PT, DPT Physical Therapist- [facility]  12/22/21, 5:01 PM

## 2021-12-22 NOTE — Progress Notes (Signed)
PROVIDER NOTE: Interpretation of information contained herein should be left to medically-trained personnel. Specific patient instructions are provided elsewhere under "Patient Instructions" section of medical record. This document was created in part using STT-dictation technology, any transcriptional errors that may result from this process are unintentional.  Patient: Joseph Arroyo Type: Established DOB: [birth year] MRN: [id number] PCP: Olin Hauser, DO  Service: Procedure DOS: 12/23/2021 Setting: Ambulatory Location: Ambulatory outpatient facility Delivery: Face-to-face Provider: Gaspar Cola, MD Specialty: Interventional Pain Management Specialty designation: 09 Location: Outpatient facility Ref. Prov.: Milinda Pointer, MD    Procedure:           Type: Lumbar Facet, Medial Branch Block(s) #1  Laterality: Bilateral  Level: L1, L2, L3, & L4 Medial Branch Level(s). Injecting these levels blocks the L2-3 & L3-4  lumbar facet joints. Imaging: Fluoroscopic guidance         Anesthesia: Local anesthesia (1-2% Lidocaine) Anxiolysis: IV Versed         Sedation: No Sedation                       DOS: 12/23/2021 Performed by: Gaspar Cola, MD  Primary Purpose: Diagnostic/Therapeutic Indications: Low back pain severe enough to impact quality of life or function. 1. Lumbar facet syndrome (Bilateral)   2. Lumbosacral facet arthropathy (Left: L3-4, L4-5, and L5-S1)   3. Grade 1 Retrolisthesis of L2/L3 (5 mm) and L3/L4 (3 mm)   4. DDD (degenerative disc disease), lumbosacral   5. Spondylosis without myelopathy or radiculopathy, lumbosacral region   6. Chronic low back pain (1ry area of Pain) (Bilateral) (R>L) w/o sciatica    NAS-11 Pain score:   Pre-procedure: 5 /10   Post-procedure: 2 /10     Position / Prep / Materials:  Position: Prone  Prep solution: DuraPrep (Iodine Povacrylex [0.7% available iodine] and Isopropyl Alcohol, 74% w/w) Area Prepped:  Posterolateral Lumbosacral Spine (Wide prep: From the lower border of the scapula down to the end of the tailbone and from flank to flank.)  Materials:  Tray: Block Needle(s):  Type: Spinal  Gauge (G): 22  Length: 5-in Qty: 4     Pre-op H&P Assessment:  [name] is a 81 y.o. (year old), male patient, seen today for interventional treatment. He  has a past surgical history that includes Other surgical history (2002); Colonoscopy (2008); Stapedes surgery (Right, 2002); Cataract extraction (Right, 07/18/14); and Cataract extraction w/PHACO (Left, 08/01/2014). Mr. Cotham has a current medication list which includes the following prescription(s): acetaminophen, alprazolam, b complex vitamins, capsicum-garlic, carbidopa-levodopa, clonidine, gabapentin, hawthorn, l-methylfolate, lutein, paroxetine, paroxetine, pramipexole, psyllium, saw palmetto (serenoa repens), and baclofen. His primarily concern today is the Back Pain (Lumbar bilateral )  Initial Vital Signs:  Pulse/HCG Rate: (!) 57ECG Heart Rate: (!) 54 Temp: (!) 96.4 F (35.8 C) Resp: 16 BP: 98/60 SpO2: 100 %  BMI: Estimated body mass index is 19.92 kg/m as calculated from the following:   Height as of this encounter: '5\' 8"'$  (1.727 m).   Weight as of this encounter: 131 lb (59.4 kg).  Risk Assessment: Allergies: Reviewed. He is allergic to prednisone and wheat bran.  Allergy Precautions: None required Coagulopathies: Reviewed. None identified.  Blood-thinner therapy: None at this time Active Infection(s): Reviewed. None identified. Mr. Safranek is afebrile  Site Confirmation: Mr. Boomer was asked to confirm the procedure and laterality before marking the site Procedure checklist: Completed Consent: Before the procedure and under the influence of no sedative(s), amnesic(s), or anxiolytics, the  patient was informed of the treatment options, risks and possible complications. To fulfill our ethical and legal obligations, as  recommended by the American Medical Association's Code of Ethics, I have informed the patient of my clinical impression; the nature and purpose of the treatment or procedure; the risks, benefits, and possible complications of the intervention; the alternatives, including doing nothing; the risk(s) and benefit(s) of the alternative treatment(s) or procedure(s); and the risk(s) and benefit(s) of doing nothing. The patient was provided information about the general risks and possible complications associated with the procedure. These may include, but are not limited to: failure to achieve desired goals, infection, bleeding, organ or nerve damage, allergic reactions, paralysis, and death. In addition, the patient was informed of those risks and complications associated to Spine-related procedures, such as failure to decrease pain; infection (i.e.: Meningitis, epidural or intraspinal abscess); bleeding (i.e.: epidural hematoma, subarachnoid hemorrhage, or any other type of intraspinal or peri-dural bleeding); organ or nerve damage (i.e.: Any type of peripheral nerve, nerve root, or spinal cord injury) with subsequent damage to sensory, motor, and/or autonomic systems, resulting in permanent pain, numbness, and/or weakness of one or several areas of the body; allergic reactions; (i.e.: anaphylactic reaction); and/or death. Furthermore, the patient was informed of those risks and complications associated with the medications. These include, but are not limited to: allergic reactions (i.e.: anaphylactic or anaphylactoid reaction(s)); adrenal axis suppression; blood sugar elevation that in diabetics may result in ketoacidosis or comma; water retention that in patients with history of congestive heart failure may result in shortness of breath, pulmonary edema, and decompensation with resultant heart failure; weight gain; swelling or edema; medication-induced neural toxicity; particulate matter embolism and blood vessel  occlusion with resultant organ, and/or nervous system infarction; and/or aseptic necrosis of one or more joints. Finally, the patient was informed that Medicine is not an exact science; therefore, there is also the possibility of unforeseen or unpredictable risks and/or possible complications that may result in a catastrophic outcome. The patient indicated having understood very clearly. We have given the patient no guarantees and we have made no promises. Enough time was given to the patient to ask questions, all of which were answered to the patient's satisfaction. Mr. Bail has indicated that he wanted to continue with the procedure. Attestation: I, the ordering provider, attest that I have discussed with the patient the benefits, risks, side-effects, alternatives, likelihood of achieving goals, and potential problems during recovery for the procedure that I have provided informed consent. Date  Time: 12/23/2021  9:24 AM  Pre-Procedure Preparation:  Monitoring: As per clinic protocol. Respiration, ETCO2, SpO2, BP, heart rate and rhythm monitor placed and checked for adequate function Safety Precautions: Patient was assessed for positional comfort and pressure points before starting the procedure. Time-out: I initiated and conducted the "Time-out" before starting the procedure, as per protocol. The patient was asked to participate by confirming the accuracy of the "Time Out" information. Verification of the correct person, site, and procedure were performed and confirmed by me, the nursing staff, and the patient. "Time-out" conducted as per Joint Commission's Universal Protocol (UP.01.01.01). Time: 1017  Description of Procedure:          Laterality: Bilateral. The procedure was performed in identical fashion on both sides. Targeted Levels:  L1, L2, L3, & L4 Medial Branch Level(s)  Safety Precautions: Aspiration looking for blood return was conducted prior to all injections. At no point did we  inject any substances, as a needle was being advanced. Before  injecting, the patient was told to immediately notify me if he was experiencing any new onset of "ringing in the ears, or metallic taste in the mouth". No attempts were made at seeking any paresthesias. Safe injection practices and needle disposal techniques used. Medications properly checked for expiration dates. SDV (single dose vial) medications used. After the completion of the procedure, all disposable equipment used was discarded in the proper designated medical waste containers. Local Anesthesia: Protocol guidelines were followed. The patient was positioned over the fluoroscopy table. The area was prepped in the usual manner. The time-out was completed. The target area was identified using fluoroscopy. A 12-in long, straight, sterile hemostat was used with fluoroscopic guidance to locate the targets for each level blocked. Once located, the skin was marked with an approved surgical skin marker. Once all sites were marked, the skin (epidermis, dermis, and hypodermis), as well as deeper tissues (fat, connective tissue and muscle) were infiltrated with a small amount of a short-acting local anesthetic, loaded on a 10cc syringe with a 25G, 1.5-in  Needle. An appropriate amount of time was allowed for local anesthetics to take effect before proceeding to the next step. Local Anesthetic: Lidocaine 2.0% The unused portion of the local anesthetic was discarded in the proper designated containers. Technical description of process:  L1 Medial Branch Nerve Block (MBB): The target area for the L1 medial branch is at the junction of the postero-lateral aspect of the superior articular process and the superior, posterior, and medial edge of the transverse process of L2. Under fluoroscopic guidance, a Quincke needle was inserted until contact was made with os over the superior postero-lateral aspect of the pedicular shadow (target area). After negative  aspiration for blood, 0.5 mL of the nerve block solution was injected without difficulty or complication. The needle was removed intact. L2 Medial Branch Nerve Block (MBB): The target area for the L2 medial branch is at the junction of the postero-lateral aspect of the superior articular process and the superior, posterior, and medial edge of the transverse process of L3. Under fluoroscopic guidance, a Quincke needle was inserted until contact was made with os over the superior postero-lateral aspect of the pedicular shadow (target area). After negative aspiration for blood, 0.5 mL of the nerve block solution was injected without difficulty or complication. The needle was removed intact. L3 Medial Branch Nerve Block (MBB): The target area for the L3 medial branch is at the junction of the postero-lateral aspect of the superior articular process and the superior, posterior, and medial edge of the transverse process of L4. Under fluoroscopic guidance, a Quincke needle was inserted until contact was made with os over the superior postero-lateral aspect of the pedicular shadow (target area). After negative aspiration for blood, 0.5 mL of the nerve block solution was injected without difficulty or complication. The needle was removed intact. L4 Medial Branch Nerve Block (MBB): The target area for the L4 medial branch is at the junction of the postero-lateral aspect of the superior articular process and the superior, posterior, and medial edge of the transverse process of L5. Under fluoroscopic guidance, a Quincke needle was inserted until contact was made with os over the superior postero-lateral aspect of the pedicular shadow (target area). After negative aspiration for blood, 0.5 mL of the nerve block solution was injected without difficulty or complication. The needle was removed intact.   Once the entire procedure was completed, the treated area was cleaned, making sure to leave some of the prepping solution  back  to take advantage of its long term bactericidal properties.         Illustration of the posterior view of the lumbar spine and the posterior neural structures. Laminae of L2 through S1 are labeled. DPRL5, dorsal primary ramus of L5; DPRS1, dorsal primary ramus of S1; DPR3, dorsal primary ramus of L3; FJ, facet (zygapophyseal) joint L3-L4; I, inferior articular process of L4; LB1, lateral branch of dorsal primary ramus of L1; IAB, inferior articular branches from L3 medial branch (supplies L4-L5 facet joint); IBP, intermediate branch plexus; MB3, medial branch of dorsal primary ramus of L3; NR3, third lumbar nerve root; S, superior articular process of L5; SAB, superior articular branches from L4 (supplies L4-5 facet joint also); TP3, transverse process of L3.  Vitals:   12/23/21 1017 12/23/21 1022 12/23/21 1028 12/23/21 1036  BP: 121/83 127/85 110/84 109/77  Pulse:      Resp: '18 18 16 14  '$ Temp:      TempSrc:      SpO2: 99% 99% 97% 99%  Weight:      Height:         Start Time: 1017 hrs. End Time: 1028 hrs.  Imaging Guidance (Spinal):          Type of Imaging Technique: Fluoroscopy Guidance (Spinal) Indication(s): Assistance in needle guidance and placement for procedures requiring needle placement in or near specific anatomical locations not easily accessible without such assistance. Exposure Time: Please see nurses notes. Contrast: None used. Fluoroscopic Guidance: I was personally present during the use of fluoroscopy. "Tunnel Vision Technique" used to obtain the best possible view of the target area. Parallax error corrected before commencing the procedure. "Direction-depth-direction" technique used to introduce the needle under continuous pulsed fluoroscopy. Once target was reached, antero-posterior, oblique, and lateral fluoroscopic projection used confirm needle placement in all planes. Images permanently stored in EMR. Interpretation: No contrast injected. I personally  interpreted the imaging intraoperatively. Adequate needle placement confirmed in multiple planes. Permanent images saved into the patient's record.  Antibiotic Prophylaxis:   Anti-infectives (From admission, onward)    None      Indication(s): None identified  Post-operative Assessment:  Post-procedure Vital Signs:  Pulse/HCG Rate: (!) 57(!) 51 Temp: (!) 96.4 F (35.8 C) Resp: 14 BP: 109/77 SpO2: 99 %  EBL: None  Complications: No immediate post-treatment complications observed by team, or reported by patient.  Note: The patient tolerated the entire procedure well. A repeat set of vitals were taken after the procedure and the patient was kept under observation following institutional policy, for this type of procedure. Post-procedural neurological assessment was performed, showing return to baseline, prior to discharge. The patient was provided with post-procedure discharge instructions, including a section on how to identify potential problems. Should any problems arise concerning this procedure, the patient was given instructions to immediately contact us, at any time, without hesitation. In any case, we plan to contact the patient by telephone for a follow-up status report regarding this interventional procedure.  Comments:  No additional relevant information.  Plan of Care  Orders:  Orders Placed This Encounter  Procedures   LUMBAR FACET(MEDIAL BRANCH NERVE BLOCK) MBNB    Scheduling Instructions:     Procedure: Lumbar facet block (AKA.: Lumbosacral medial branch nerve block)     Side: Bilateral     Level: L1, L2, L3, & L4 Medial Branch Level(s), or L2-3 & L3-4  lumbar facet joints.     Sedation: Patient's choice.     Timeframe: Today  Order Specific Question:   Where will this procedure be performed?    Answer:   [facility]   DG [facility] C-ARM 1-60 MIN NO REPORT    Intraoperative interpretation by procedural physician at [place].     Standing Status:   Standing    Number of Occurrences:   1    Order Specific Question:   Reason for exam:    Answer:   Assistance in needle guidance and placement for procedures requiring needle placement in or near specific anatomical locations not easily accessible without such assistance.   Informed Consent Details: Physician/Practitioner Attestation; Transcribe to consent form and obtain patient signature    Nursing Order: Transcribe to consent form and obtain patient signature. Note: Always confirm laterality of pain with Mr. Merlos, before procedure.    Order Specific Question:   Physician/Practitioner attestation of informed consent for procedure/surgical case    Answer:   I, the physician/practitioner, attest that I have discussed with the patient the benefits, risks, side effects, alternatives, likelihood of achieving goals and potential problems during recovery for the procedure that I have provided informed consent.    Order Specific Question:   Procedure    Answer:   Lumbar Facet Block  under fluoroscopic guidance    Order Specific Question:   Physician/Practitioner performing the procedure    Answer:   Jayna Mulnix A. Dossie Arbour MD    Order Specific Question:   Indication/Reason    Answer:   Low Back Pain, with our without leg pain, due to Facet Joint Arthralgia (Joint Pain) Spondylosis (Arthritis of the Spine), without myelopathy or radiculopathy (Nerve Damage).   Provide equipment / supplies at bedside    "Block Tray" (Disposable  single use) Needle type: SpinalSpinal Amount/quantity: 4 Size: Regular (3.5-inch) Gauge: 22G    Standing Status:   Standing    Number of Occurrences:   1    Order Specific Question:   Specify    Answer:   Block Tray   Chronic Opioid Analgesic:  None MME/day: 0 mg/day   Medications ordered for procedure: Meds ordered this encounter  Medications   lidocaine (XYLOCAINE) 2 % (with pres) injection 400 mg   pentafluoroprop-tetrafluoroeth (GEBAUERS)  aerosol   lactated ringers infusion   midazolam (VERSED) 5 MG/5ML injection 0.5-2 mg    Make sure Flumazenil is available in the pyxis when using this medication. If oversedation occurs, administer 0.2 mg IV over 15 sec. If after 45 sec no response, administer 0.2 mg again over 1 min; may repeat at 1 min intervals; not to exceed 4 doses (1 mg)   ropivacaine (PF) 2 mg/mL (0.2%) (NAROPIN) injection 18 mL   triamcinolone acetonide (KENALOG-40) injection 80 mg   Medications administered: We administered lidocaine, pentafluoroprop-tetrafluoroeth, lactated ringers, midazolam, ropivacaine (PF) 2 mg/mL (0.2%), and triamcinolone acetonide.  See the medical record for exact dosing, route, and time of administration.  Follow-up plan:   Return in about 2 weeks (around 01/06/2022) for Proc-day (T,Th), (F2F), (PPE).       Interventional Therapies  Risk  Complexity Considerations:   Estimated body mass index is 20.98 kg/m as calculated from the following:   Height as of this encounter: '5\' 8"'$  (1.727 m).   Weight as of this encounter: 138 lb (62.6 kg). Parkinson's  SENSITIVITY: Oral Prednisone (Hyperactivity)   Planned  Pending:   Diagnostic bilateral L2-3 & L3-4 Facet Blk (L1, L2, L3, & L4 MBB) #1    Under consideration:   Diagnostic bilateral L2-3 &  L3-4 lumbar facet MBB #1  Diagnostic x-rays of the cervical spine for further evaluation of his cervicalgia.   Completed:   Diagnostic bilateral lumbar facet (L2-S1) MBB x1 (07/08/2021) (50/50/50/<50)  Diagnostic right L1-2 LESI x1 (08/26/2021) (100/100/50/50)  Diagnostic right L2-3 LESI x1 (06/03/2021) (100/100/45/45)  Referral to physical therapy for LB PT as well as a back brace eval. (04/28/2021) (water aerobics)    Therapeutic  Palliative (PRN) options:   None established     Recent Visits Date Type Provider Dept  12/10/21 Office Visit Milinda Pointer, MD [facility]  Showing recent visits within past 90 days and meeting all other  requirements Today's Visits Date Type Provider Dept  12/23/21 Procedure visit Milinda Pointer, MD [facility]  Showing today's visits and meeting all other requirements Future Appointments Date Type Provider Dept  01/15/22 Appointment Milinda Pointer, MD [facility]  Showing future appointments within next 90 days and meeting all other requirements  Disposition: Discharge home  Discharge (Date  Time): 12/23/2021; 1045 hrs.   Primary Care Physician: Olin Hauser, DO Location: [facility] Note by: Gaspar Cola, MD Date: 12/23/2021; Time: 1:15 PM  Disclaimer:  Medicine is not an Chief Strategy Officer. The only guarantee in medicine is that nothing is guaranteed. It is important to note that the decision to proceed with this intervention was based on the information collected from the patient. The Data and conclusions were drawn from the patient's questionnaire, the interview, and the physical examination. Because the information was provided in large part by the patient, it cannot be guaranteed that it has not been purposely or unconsciously manipulated. Every effort has been made to obtain as much relevant data as possible for this evaluation. It is important to note that the conclusions that lead to this procedure are derived in large part from the available data. Always take into account that the treatment will also be dependent on availability of resources and existing treatment guidelines, considered by other Pain Management Practitioners as being common knowledge and practice, at the time of the intervention. For Medico-Legal purposes, it is also important to point out that variation in procedural techniques and pharmacological choices are the acceptable norm. The indications, contraindications, technique, and results of the above procedure should only be interpreted and judged by a Board-Certified Interventional Pain Specialist  with extensive familiarity and expertise in the same exact procedure and technique.

## 2021-12-23 ENCOUNTER — Ambulatory Visit: Payer: Medicare HMO | Attending: Pain Medicine | Admitting: Pain Medicine

## 2021-12-23 ENCOUNTER — Encounter: Payer: Medicare HMO | Admitting: Speech Pathology

## 2021-12-23 ENCOUNTER — Encounter: Payer: Self-pay | Admitting: Pain Medicine

## 2021-12-23 ENCOUNTER — Ambulatory Visit
Admission: RE | Admit: 2021-12-23 | Discharge: 2021-12-23 | Disposition: A | Discharge status: Home / Self Care | Payer: Medicare HMO | Source: Ambulatory Visit | Attending: Pain Medicine | Admitting: Pain Medicine

## 2021-12-23 ENCOUNTER — Encounter: Payer: Medicare HMO | Admitting: Occupational Therapy

## 2021-12-23 VITALS — BP 109/77 | HR 57 | Temp 96.4°F | Resp 14 | Ht 68.0 in | Wt 131.0 lb

## 2021-12-23 DIAGNOSIS — M47816 Spondylosis without myelopathy or radiculopathy, lumbar region: Secondary | ICD-10-CM

## 2021-12-23 DIAGNOSIS — R937 Abnormal findings on diagnostic imaging of other parts of musculoskeletal system: Secondary | ICD-10-CM | POA: Diagnosis not present

## 2021-12-23 DIAGNOSIS — M4310 Spondylolisthesis, site unspecified: Secondary | ICD-10-CM | POA: Insufficient documentation

## 2021-12-23 DIAGNOSIS — G8929 Other chronic pain: Secondary | ICD-10-CM | POA: Diagnosis not present

## 2021-12-23 DIAGNOSIS — M545 Low back pain, unspecified: Secondary | ICD-10-CM | POA: Diagnosis not present

## 2021-12-23 DIAGNOSIS — M5137 Other intervertebral disc degeneration, lumbosacral region: Secondary | ICD-10-CM | POA: Diagnosis not present

## 2021-12-23 DIAGNOSIS — M47817 Spondylosis without myelopathy or radiculopathy, lumbosacral region: Secondary | ICD-10-CM | POA: Diagnosis not present

## 2021-12-23 MED ORDER — LACTATED RINGERS IV SOLN: Freq: Once | INTRAVENOUS | Status: AC

## 2021-12-23 MED ORDER — LIDOCAINE HCL 2 % IJ SOLN
20.0000 mL | Freq: Once | INTRAMUSCULAR | Status: AC
  Administered 2021-12-23: 400 mg
  Filled 2021-12-23: qty 40

## 2021-12-23 MED ORDER — PENTAFLUOROPROP-TETRAFLUOROETH EX AERO
INHALATION_SPRAY | Freq: Once | CUTANEOUS | Status: AC
  Administered 2021-12-23: 30 via TOPICAL
  Filled 2021-12-23: qty 116

## 2021-12-23 MED ORDER — ROPIVACAINE HCL 2 MG/ML IJ SOLN
18.0000 mL | Freq: Once | INTRAMUSCULAR | Status: AC
  Administered 2021-12-23: 18 mL via PERINEURAL
  Filled 2021-12-23: qty 20

## 2021-12-23 MED ORDER — MIDAZOLAM HCL 2 MG/2ML IJ SOLN
INTRAMUSCULAR | Status: AC
  Filled 2021-12-23: qty 2

## 2021-12-23 MED ORDER — TRIAMCINOLONE ACETONIDE 40 MG/ML IJ SUSP
80.0000 mg | Freq: Once | INTRAMUSCULAR | Status: AC
  Administered 2021-12-23: 80 mg
  Filled 2021-12-23: qty 2

## 2021-12-23 MED ORDER — MIDAZOLAM HCL 5 MG/5ML IJ SOLN
0.5000 mg | Freq: Once | INTRAMUSCULAR | Status: AC
  Administered 2021-12-23: 1.5 mg via INTRAVENOUS

## 2021-12-23 NOTE — Progress Notes (Signed)
Safety precautions to be maintained throughout the outpatient stay will include: orient to surroundings, keep bed in low position, maintain call bell within reach at all times, provide assistance with transfer out of bed and ambulation.  

## 2021-12-23 NOTE — Patient Instructions (Signed)
____________________________________________________________________________________________  Post-Procedure Discharge Instructions  Instructions: Apply ice:  Purpose: This will minimize any swelling and discomfort after procedure.  When: Day of procedure, as soon as you get home. How: Fill a plastic sandwich bag with crushed ice. Cover it with a small towel and apply to injection site. How long: (15 min on, 15 min off) Apply for 15 minutes then remove x 15 minutes.  Repeat sequence on day of procedure, until you go to bed. Apply heat:  Purpose: To treat any soreness and discomfort from the procedure. When: Starting the next day after the procedure. How: Apply heat to procedure site starting the day following the procedure. How long: May continue to repeat daily, until discomfort goes away. Food intake: Start with clear liquids (like water) and advance to regular food, as tolerated.  Physical activities: Keep activities to a minimum for the first 8 hours after the procedure. After that, then as tolerated. Driving: If you have received any sedation, be responsible and do not drive. You are not allowed to drive for 24 hours after having sedation. Blood thinner: (Applies only to those taking blood thinners) You may restart your blood thinner 6 hours after your procedure. Insulin: (Applies only to Diabetic patients taking insulin) As soon as you can eat, you may resume your normal dosing schedule. Infection prevention: Keep procedure site clean and dry. Shower daily and clean area with soap and water. Post-procedure Pain Diary: Extremely important that this be done correctly and accurately. Recorded information will be used to determine the next step in treatment. For the purpose of accuracy, follow these rules: Evaluate only the area treated. Do not report or include pain from an untreated area. For the purpose of this evaluation, ignore all other areas of pain, except for the treated area. After  your procedure, avoid taking a long nap and attempting to complete the pain diary after you wake up. Instead, set your alarm clock to go off every hour, on the hour, for the initial 8 hours after the procedure. Document the duration of the numbing medicine, and the relief you are getting from it. Do not go to sleep and attempt to complete it later. It will not be accurate. If you received sedation, it is likely that you were given a medication that may cause amnesia. Because of this, completing the diary at a later time may cause the information to be inaccurate. This information is needed to plan your care. Follow-up appointment: Keep your post-procedure follow-up evaluation appointment after the procedure (usually 2 weeks for most procedures, 6 weeks for radiofrequencies). DO NOT FORGET to bring you pain diary with you.   Expect: (What should I expect to see with my procedure?) From numbing medicine (AKA: Local Anesthetics): Numbness or decrease in pain. You may also experience some weakness, which if present, could last for the duration of the local anesthetic. Onset: Full effect within 15 minutes of injected. Duration: It will depend on the type of local anesthetic used. On the average, 1 to 8 hours.  From steroids (Applies only if steroids were used): Decrease in swelling or inflammation. Once inflammation is improved, relief of the pain will follow. Onset of benefits: Depends on the amount of swelling present. The more swelling, the longer it will take for the benefits to be seen. In some cases, up to 10 days. Duration: Steroids will stay in the system x 2 weeks. Duration of benefits will depend on multiple posibilities including persistent irritating factors. Side-effects: If present, they  may typically last 2 weeks (the duration of the steroids). Frequent: Cramps (if they occur, drink Gatorade and take over-the-counter Magnesium 450-500 mg once to twice a day); water retention with temporary  weight gain; increases in blood sugar; decreased immune system response; increased appetite. Occasional: Facial flushing (red, warm cheeks); mood swings; menstrual changes. Uncommon: Long-term decrease or suppression of natural hormones; bone thinning. (These are more common with higher doses or more frequent use. This is why we prefer that our patients avoid having any injection therapies in other practices.)  Very Rare: Severe mood changes; psychosis; aseptic necrosis. From procedure: Some discomfort is to be expected once the numbing medicine wears off. This should be minimal if ice and heat are applied as instructed.  Call if: (When should I call?) You experience numbness and weakness that gets worse with time, as opposed to wearing off. New onset bowel or bladder incontinence. (Applies only to procedures done in the spine)  Emergency Numbers: Durning business hours (Monday - Thursday, 8:00 AM - 4:00 PM) (Friday, 9:00 AM - 12:00 Noon): [phone] After hours: [phone] NOTE: If you are having a problem and are unable connect with, or to talk to a provider, then go to your nearest urgent care or emergency department. If the problem is serious and urgent, please call 911. ____________________________________________________________________________________________  ____________________________________________________________________________________________  Patient Information update  To: All of our patients.  Re: Name change.  It has been made official that our current name, "[facility]"   will soon be changed "[facility]".   The purpose of this change is to eliminate any confusion created by the concept of our practice being a "Pain Clinic". In the past this has led to the misconception that we treat pain primarily by the use of prescription medications.  Nothing can be  farther from the truth.   Understanding PAIN MANAGEMENT: To further understand what our practice does, you first have to understand that "Pain Management" is a subspecialty that requires additional training once a physician has completed their specialty training, which can be in either Anesthesia, Neurology, Psychiatry, or Physical Medicine and Rehabilitation (PMR). Each one of these contributes to the final approach taken by each physician to the management of their patient's pain. To be a "Pain Management Specialist" you must have completed one of the specialty trainings below.  Anesthesiologists are trained in clinical pharmacology and interventional techniques such as nerve blockade and regional as well as central neuroanatomy. They are trained to block pain before, during, and after surgical interventions.  Neurologists are trained in the diagnosis and pharmacological treatment of complex neurological conditions, such as Multiple Sclerosis, Parkinson's, spinal cord injuries, and other systemic conditions that may be associated with symptoms that may include but are not limited to pain. They tend to rely primarily on the treatment of chronic pain using prescription medications.  Psychiatrist are trained in conditions affecting the psychosocial wellbeing of patients including but not limited to depression, anxiety, schizophrenia, personality disorders, addiction, and other substance use disorders that may be associated with chronic pain. They tend to rely primarily on the treatment of chronic pain using prescription medications.   Physical Medicine and Rehabilitation (PMR) physicians, also known as physiatrists, treat a wide variety of medical conditions affecting the brain, spinal cord, nerves, bones, joints, ligaments, muscles, and tendons. Their training is primarily aimed at treating patients that have suffered injuries that have caused severe physical impairment. They  are trained in the physical  therapy and rehabilitation of those patients. They may also work alongside orthopedic surgeons or neurosurgeons using their expertise in [facility] patients to recover after their surgery.  INTERVENTIONAL PAIN MANAGEMENT is s sub-subspecialty of Pain Management.  Our physicians are Board-certified in Anesthesia, Pain Management, and Interventional Pain Management.  This meaning that not only have they been trained and Board-certified in their specialty of Anesthesia, subspecialty of Pain Management, but they have also received further training in the sub-subspecialty of Interventional Pain Management, in order to be Board-certified as INTERVENTIONAL PAIN MANAGEMENT SPECIALIST.    Mission: Our goal is to use INTERVENTIONAL PAIN MANAGEMENT techniques as an alternative option to the chronic use of prescription medication for the treatment of pain. To make this clear, we have changed our name and we will no longer be taking patients for medication management. We will continue to offer medication management assessment and recommendations, but we will not be taking over any patient's medication regimen.  ____________________________________________________________________________________________

## 2021-12-24 ENCOUNTER — Telehealth: Payer: Self-pay

## 2021-12-24 ENCOUNTER — Telehealth: Payer: Self-pay | Admitting: Pain Medicine

## 2021-12-24 ENCOUNTER — Ambulatory Visit: Payer: Medicare HMO | Admitting: Physical Therapy

## 2021-12-24 NOTE — Telephone Encounter (Signed)
Post procedure phone call.  LM 

## 2021-12-24 NOTE — Telephone Encounter (Signed)
PT wife called back stated that she missed the call. Husband is still asleep, so could he get a call back in about 30 mins. Thanks

## 2021-12-25 ENCOUNTER — Ambulatory Visit: Payer: Medicare HMO

## 2021-12-25 ENCOUNTER — Encounter: Payer: Medicare HMO | Admitting: Occupational Therapy

## 2021-12-25 ENCOUNTER — Encounter: Payer: Medicare HMO | Admitting: Speech Pathology

## 2021-12-30 ENCOUNTER — Encounter: Payer: Medicare HMO | Admitting: Speech Pathology

## 2021-12-30 ENCOUNTER — Ambulatory Visit: Payer: Medicare HMO

## 2021-12-30 ENCOUNTER — Encounter: Payer: Medicare HMO | Admitting: Occupational Therapy

## 2021-12-30 DIAGNOSIS — M6281 Muscle weakness (generalized): Secondary | ICD-10-CM

## 2021-12-30 DIAGNOSIS — R278 Other lack of coordination: Secondary | ICD-10-CM | POA: Diagnosis not present

## 2021-12-30 DIAGNOSIS — R262 Difficulty in walking, not elsewhere classified: Secondary | ICD-10-CM | POA: Diagnosis not present

## 2021-12-30 DIAGNOSIS — R269 Unspecified abnormalities of gait and mobility: Secondary | ICD-10-CM | POA: Diagnosis not present

## 2021-12-30 DIAGNOSIS — R2681 Unsteadiness on feet: Secondary | ICD-10-CM

## 2021-12-30 DIAGNOSIS — G20C Parkinsonism, unspecified: Secondary | ICD-10-CM | POA: Diagnosis not present

## 2021-12-30 DIAGNOSIS — R2689 Other abnormalities of gait and mobility: Secondary | ICD-10-CM | POA: Diagnosis not present

## 2021-12-30 DIAGNOSIS — R490 Dysphonia: Secondary | ICD-10-CM | POA: Diagnosis not present

## 2021-12-30 DIAGNOSIS — R41841 Cognitive communication deficit: Secondary | ICD-10-CM | POA: Diagnosis not present

## 2021-12-30 DIAGNOSIS — R471 Dysarthria and anarthria: Secondary | ICD-10-CM | POA: Diagnosis not present

## 2021-12-30 DIAGNOSIS — R1312 Dysphagia, oropharyngeal phase: Secondary | ICD-10-CM | POA: Diagnosis not present

## 2021-12-30 NOTE — Therapy (Signed)
[facility] OUT[facility]IENT PHYS[facility]ERAPY NEURO TREATMENT   Patient Name: Joseph Arroyo MRN: [id number] DOB:1940/09/08, 81 y.o., male Today's Date: 12/30/2021   PCP: Olin Hauser, DO REFERRING PROVIDER: Lavena Bullion, NP   PT End of Session - 12/30/21 1431     Visit Number 28    Number of Visits 40    Date for PT Re-Evaluation 02/05/22    Authorization Type [employer]    Authorization Time Period Initial Cert 2/84/1324- 06/01/270; Recert 5/36/6440-34/08/4257; PN 11/27/21    Progress Note Due on Visit 30    PT Start Time 1430    PT Stop Time 1515    PT Time Calculation (min) 45 min    Equipment Utilized During Treatment Gait belt    Activity Tolerance Patient tolerated treatment well    Behavior During Therapy [facility] for tasks assessed/performed                  Past Medical History:  Diagnosis Date   Allergic rhinitis due to pollen 11/21/2007   Brachial neuritis or radiculitis NOS    Cervicalgia    Concussion    age 38 - s/p accident   Costal chondritis    Depression    Essential and other specified forms of tremor    GERD (gastroesophageal reflux disease)    in past   Lumbago    Occlusion and stenosis of carotid artery without mention of cerebral infarction    Seizures [facility])    age 3 - after concussion   Past Surgical History:  Procedure Laterality Date   CATARACT EXTRACTION Right 07/18/14   CATARACT EXTRACTION W/PHACO Left 08/01/2014   Procedure: CATARACT EXTRACTION PHACO AND INTRAOCULAR LENS PLACEMENT ([place]);  Surgeon: Leandrew Koyanagi, MD;  Location: [facility];  Service: Ophthalmology;  Laterality: Left;  IVA TOPICAL   COLONOSCOPY  2008   OTHER SURGICAL HISTORY  2002   ear surgery   STAPEDES SURGERY Right 2002   Patient Active Problem List   Diagnosis Date Noted   Dysphagia, pharyngeal phase 09/17/2021   Cervicalgia 08/05/2021   Spondylosis without myelopathy or radiculopathy, lumbosacral region 06/24/2021   Lumbar central  spinal stenosis, w/o neurogenic claudication (L4-5) 06/03/2021   Lumbar lateral recess stenosis (Bilateral: L2-3, L4-5) (Right: L3-4) 06/03/2021   Lumbosacral foraminal stenosis (Bilateral: L2-3, L3-4) (Left: L5-S1) 06/03/2021   Lumbar nerve root impingement (Right: L3 at L2-3 & L3-4) 06/03/2021   Ligamentum flavum hypertrophy (L3-4, L4-5) 06/03/2021   Abnormal MRI, lumbar spine (04/23/2021) 04/24/2021   Long term prescription benzodiazepine use (alprazolam) (Xanax) 03/24/2021   Grade 1 Retrolisthesis of L2/L3 (5 mm) and L3/L4 (3 mm) 03/24/2021   Levoscoliosis of lumbar spine (L3-4 apex) 03/24/2021   DDD (degenerative disc disease), lumbosacral 03/24/2021   Lumbosacral facet arthropathy (Left: L3-4, L4-5, and L5-S1) 03/24/2021   Tricompartment osteoarthritis of knee (Left) 03/24/2021   Baker cyst (Left) 03/24/2021   Chronic low back pain (1ry area of Pain) (Bilateral) (R>L) w/o sciatica 03/24/2021   Lumbar facet syndrome (Bilateral) 03/24/2021   Chronic lower extremity pain (2ry area of Pain) (Bilateral) (L>R) 03/24/2021   Lumbosacral radiculitis/sensory radiculopathy at L2 (Bilateral) 03/24/2021   Lumbosacral radiculitis/sensory radiculopathy at L3 (Bilateral) 03/24/2021   Chronic pain syndrome 03/23/2021   Pharmacologic therapy 03/23/2021   Disorder of skeletal system 03/23/2021   Problems influencing health status 03/23/2021   PAD (peripheral artery disease) ([place]) 10/01/2020   GAD (generalized anxiety disorder) 01/13/2018   MDD (major depressive disorder) 56/38/7564   Umbilical hernia 33/29/5188  Chronic knee pain (Left) 08/11/2017   Derangement of medial meniscus, posterior horn (Left) 08/11/2017   Vitamin D insufficiency 03/05/2016   BPH without obstruction/lower urinary tract symptoms 07/08/2015   Chronic fatigue 10/23/2014   Parkinson's disease ([place]) 09/20/2014   Major depression, recurrent, full remission ([place]) 07/26/2013   Unsteady gait 07/26/2013   Orthostatic hypotension  07/26/2013   Depression 07/26/2013   Anxiety 06/23/2013   Chronic anxiety 06/23/2013   Tremor 05/18/2013   Bilateral carotid artery stenosis 08/30/2012    ONSET DATE: 08/11/2021  REFERRING DIAG: Parkinsons Disease  THERAPY DIAG:  Difficulty in walking, not elsewhere classified  Muscle weakness (generalized)  Unsteadiness on feet  Abnormality of gait and mobility  Other abnormalities of gait and mobility  Rationale for Evaluation and Treatment Rehabilitation  SUBJECTIVE:                                                                                                                                                                                            SUBJECTIVE STATEMENT:  Pt reports 4/10 pain upon arrival in the lumbar spine.  Pt notes that the injections have not helped a significant amount.  Pt accompanied by: significant other  PERTINENT HISTORY: Per chart history provided by Dr. Consuela Mimes- Mr. Stickels has Parkinson's disease ([place]); Chronic knee pain (Left); Derangement of medial meniscus, posterior horn (Left); PAD (peripheral artery disease) ([place]); Chronic pain syndrome; Grade 1 Retrolisthesis of L2/L3 (5 mm) and L3/L4 (3 mm); Levoscoliosis of lumbar spine (L3-4 apex); DDD (degenerative disc disease), lumbosacral; Lumbosacral facet arthropathy (Left: L3-4, L4-5, and L5-S1); Tricompartment osteoarthritis of knee (Left); Baker cyst (Left); Chronic low back pain (1ry area of Pain) (Bilateral) (R>L) w/o sciatica; Lumbar facet syndrome (Bilateral); Chronic lower extremity pain (2ry area of Pain) (Bilateral) (L>R); Lumbosacral radiculitis/sensory radiculopathy at L2 (Bilateral); Lumbosacral radiculitis/sensory radiculopathy at L3 (Bilateral); Abnormal MRI, lumbar spine (04/23/2021); Lumbosacral facet hypertrophy; Lumbar central spinal stenosis, w/o neurogenic claudication (L4-5); Lumbar lateral recess stenosis (Bilateral: L2-3, L4-5) (Right: L3-4); Lumbosacral foraminal stenosis  (Bilateral: L2-3, L3-4) (Left: L5-S1); Lumbar nerve root impingement (Right: L3 at L2-3 & L3-4); Ligamentum flavum hypertrophy (L3-4, L4-5); Spondylosis without myelopathy or radiculopathy, lumbosacral region; and Cervicalgia on their pertinent problem list   Patient has appointment for Epidural to low back next Tues 08/26/2021  PAIN:  Are you having pain? Yes: NPRS scale: 4/10 Pain location: low back Pain description: ache/soreness Aggravating factors: prolonged standing, transfers, walking Relieving factors: rest and medications  PRECAUTIONS: Fall  WEIGHT BEARING RESTRICTIONS No  FALLS: Has patient fallen in last 6 months? Yes. Number of falls 2  LIVING ENVIRONMENT: Lives with: lives with their  spouse Lives in: House/apartment Stairs: 4 Has following equipment at home: [place] - 2 wheeled  PLOF: Needs assistance with ADLs- intermittent  PATIENT GOALS  to walk better and be as active as possible. Also to get in/out of cars better.   OBJECTIVE: (objective measures completed at initial evaluation unless otherwise dated)   DIAGNOSTIC FINDINGS: IMPRESSION: 1. Severe lumbar levoscoliosis with widespread advanced disc and facet degeneration. 2. Mild spinal stenosis at L4-5. 3. Moderate neural foraminal stenosis on the right at L3-4 and on the left at L5-S1. 4. Moderate right lateral recess stenosis at L2-3.     LOWER EXTREMITY MMT:    MMT (evaluation)  Right Eval Left Eval  Hip flexion 4 4  Hip extension 4 4  Hip abduction 4 4  Hip adduction 4 4  Hip internal rotation 4 4  Hip external rotation 4 4  Knee flexion 4 4  Knee extension 4 4  Ankle dorsiflexion 4 4  Ankle plantarflexion 4 4  Ankle inversion 4 4  Ankle eversion 4 4  (Blank rows = not tested)     FUNCTIONAL TESTS (EVALUATION):  5 times sit to stand: 14 sec without UE support Timed up and go (TUG): 15.67 sec with RW 10 meter walk test: 10.67 sec = 0.94 m/s using RW Berg Balance Scale: 48/56- see  flowsheet for details  PATIENT SURVEYS:  FOTO : 49 8/24: 47  TODAY'S TREATMENT:   Heat applied to patient's lower back region for all seated rest breaks as well as seated therapeutic exercises  There.ex: Seated exercise to warm up:  LAQ's: x20/LE Alternating marche,  2x10 each LE Hamstring stretch, 30 sec bouts each LE   Neuro Re-ED:   PWR seated exercises:  Up - Posture (legs abducted, hands on knees and upright posture, transitioning to forward bend and then back upright with arms and LE's abducted) x10 Rock - Weight Shift (lateral lean with contralateral UE abducted towards ceiling, then transitioning to the opposite side) x10 each side Twist - Trunk (arms abducted and then clapping to the contralateral side, reset, then the opposite way) x10 each side Step - Transition (hands on knees, forward lean, then LE's moved to each side with large amplitude movements) x10 each side     PATIENT EDUCATION: Education details: Pt educated throughout session about proper posture and technique with exercises. Improved exercise technique, movement at target joints, use of target muscles after min to mod verbal, visual, tactile cues.  Person educated: Patient Education method: Explanation, Demonstration, Tactile cues, and Verbal cues Education comprehension: verbalized understanding, returned demonstration, verbal cues required, tactile cues required, and needs further education   HOME EXERCISE PROGRAM: No updates on this date, pt to continue HEP as previously given: PWR! Basic 4 seated x 10 ea, handout provided: addended to not perform seated PRW! Rock exercise and added the below to program on 09/25/21  Access Code: [email or web address] URL: [email or web address] Date: 09/25/2021 Prepared by: Rivka Barbara  Exercises - Standing Romberg to 3/4 Tandem Stance  - 1 x daily - 7 x weekly - 2 sets - 30 seocnd hold - Standing March with Counter Support  - 1 x daily - 7 x weekly - 2 sets  - 10 reps - Modified Single Leg Balance on Step  - 1 x daily - 7 x weekly - 2 sets - 30 second hold  Access Code: [email or web address] URL: [email or web address] Date: 10/21/2021 Prepared by: Janna Arch  Exercises - Seated Abdominal Press into [employer]  - 1 x  daily - 7 x weekly - 2 sets - 10 reps - 5 hold - Seated Flexion Stretch with Swiss Ball  - 1 x daily - 7 x weekly - 2 sets - 10 reps - 5 hold  Access Code: [email or web address] URL: [email or web address] Date: 12/04/2021 Prepared by: Janna Arch  Exercises - Seated Hamstring Stretch  - 1 x daily - 7 x weekly - 2 sets - 2 reps - 30 hold - Standing Hamstring Stretch with Step  - 1 x daily - 7 x weekly - 2 sets - 2 reps - 30 hold  Access Code: [email or web address] URL: [email or web address] Date: 12/09/2021 Prepared by: Amalia Hailey  Exercises - Seated Alternating Side Stretch with Arm Overhead  - 1 x daily - 7 x weekly - 2 sets - 10 reps - Seated Thoracic Extension Arms Overhead  - 1 x daily - 7 x weekly - 2 sets - 10 reps  GOALS: Goals reviewed with patient? Yes  SHORT TERM GOALS: Target date: 10/02/2021  Pt will be independent with initial HEP in order to improve strength and balance in order to decrease fall risk and improve function at home and work.  Baseline: 08/21/2021-No formal HEP in place Goal status: MET    LONG TERM GOALS: Target date: 02/05/2022  Pt will improve BERG by at least 3 points in order to demonstrate clinically significant improvement in balance.    Baseline: 08/21/2021= 48/56 8/24:47/56; 11/13/2021= 47/56 Goal status: ongoing  2.  Pt will be independent with final HEP in order to improve strength and balance in order to decrease fall risk and improve function at home and work.  Baseline: No HEP in place, 8/24: completing current HEP but still progressing; 11/13/2021- Patient continues to verbalize and demonstrate understanding of HEP for stretching/strengthening and mobility but still  progressing and altering program as appropriate Goal status: IN PROGRESS  3.  Pt will decrease 5TSTS by at least 4 seconds in order to demonstrate clinically significant improvement in LE strength. Baseline: 08/21/2021= 19.01 sec without UE support 8/24:10.56 sec Goal status: MET  4.  Pt will decrease TUG to below 14 seconds/decrease in order to demonstrate decreased fall risk. Baseline:  08/21/2021=15.67 with RW 8/24: 12.41 Goal status: MET  5.  Pt will improve FOTO to target score of 56 to display perceived improvements in ability to complete ADL's.  Baseline: 08/21/2021=49 8/24: 45.6; 11/13/2021=46 Goal status: IN PROGRESS  6.  Pt will increase 10MWT by at least 0.15 m/s in order to demonstrate clinically significant improvement in community ambulation.   Baseline: 08/22/2021=0.94 m/s 8/24:1.29ms Goal status: MET  7 Pt will improve miniBESTest by at least 4 points to demonstrate clinically significant improvement in reduced falls with parkinsonian related deficits.  Baseline: 19/28; 11/13/2021- Unable to test due to time constraint- will test next session and update goal. 9/19: 18/28  Goal Status: ONGOING  8. Pt will increase 6MWT by at least [id number] order to demonstrate clinically significant improvement in cardiopulmonary endurance and community ambulation  Baseline: 11/13/2021= 650 feet with walker Goal Status: NEW  ASSESSMENT:  CLINICAL IMPRESSION:  Pt continues to make good progress with PWR related activities and is able to perform the seated exercises without any increase in lumbar pain.  Pt noted to have mild relief from the previous injections at this time, still noting 3/10 pain at rest and during exercises.  No noticeable difference is found from rest to when he participates in exercises.   Pt will continue to benefit from skilled  therapy to address remaining deficits in order to improve overall QoL and return to PLOF.      OBJECTIVE IMPAIRMENTS Abnormal gait,  decreased activity tolerance, decreased balance, decreased coordination, decreased endurance, decreased knowledge of use of DME, decreased mobility, difficulty walking, decreased ROM, decreased strength, hypomobility, and impaired flexibility.   ACTIVITY LIMITATIONS carrying, lifting, bending, standing, squatting, sleeping, stairs, transfers, continence, bathing, toileting, and dressing  PARTICIPATION LIMITATIONS: cleaning, laundry, medication management, driving, shopping, community activity, and yard work  PERSONAL FACTORS Age are also affecting patient's functional outcome.   REHAB POTENTIAL: Good  CLINICAL DECISION MAKING: Stable/uncomplicated  EVALUATION COMPLEXITY: Low  PLAN: PT FREQUENCY: 2x/week  PT DURATION: 12 weeks  PLANNED INTERVENTIONS: Therapeutic exercises, Therapeutic activity, Neuromuscular re-education, Balance training, Gait training, Patient/Family education, Joint mobilization, Stair training, Vestibular training, Canalith repositioning, DME instructions, Dry Needling, Electrical stimulation, Spinal manipulation, and Spinal mobilization  PLAN FOR NEXT SESSION: dynamic cross body movements, continue balance training, monitor hypotension and orthostatics, ROM for flexibility, continue plan   Gwenlyn Saran, PT, DPT Physical Therapist- [facility]  12/30/21, 10:43 PM

## 2022-01-01 ENCOUNTER — Ambulatory Visit: Payer: Medicare HMO | Attending: Neurology

## 2022-01-01 ENCOUNTER — Encounter: Payer: Medicare HMO | Admitting: Speech Pathology

## 2022-01-01 DIAGNOSIS — M6281 Muscle weakness (generalized): Secondary | ICD-10-CM | POA: Insufficient documentation

## 2022-01-01 DIAGNOSIS — R262 Difficulty in walking, not elsewhere classified: Secondary | ICD-10-CM | POA: Insufficient documentation

## 2022-01-01 DIAGNOSIS — R278 Other lack of coordination: Secondary | ICD-10-CM | POA: Insufficient documentation

## 2022-01-01 DIAGNOSIS — G20C Parkinsonism, unspecified: Secondary | ICD-10-CM | POA: Insufficient documentation

## 2022-01-01 DIAGNOSIS — R269 Unspecified abnormalities of gait and mobility: Secondary | ICD-10-CM | POA: Insufficient documentation

## 2022-01-01 DIAGNOSIS — R2681 Unsteadiness on feet: Secondary | ICD-10-CM | POA: Insufficient documentation

## 2022-01-01 DIAGNOSIS — R2689 Other abnormalities of gait and mobility: Secondary | ICD-10-CM | POA: Diagnosis not present

## 2022-01-01 NOTE — Therapy (Signed)
[facility] OUTPATIENT PHYSICAL THERAPY NEURO TREATMENT   Patient Name: Joseph Arroyo MRN: [id number] DOB:Jun 03, 1940, 81 y.o., male Today's Date: 01/02/2022   PCP: Olin Hauser, DO REFERRING PROVIDER: Lavena Bullion, NP   PT End of Session - 01/01/22 1544     Visit Number 29    Number of Visits 38    Date for PT Re-Evaluation 02/05/22    Authorization Type [employer]    Authorization Time Period Initial Cert 10/29/9405- 6/80/8811; Recert 0/31/5945-85/11/2922; PN 11/27/21    Progress Note Due on Visit 15    PT Start Time 1545    PT Stop Time 1635    PT Time Calculation (min) 50 min    Equipment Utilized During Treatment Gait belt    Activity Tolerance Patient tolerated treatment well    Behavior During Therapy [facility] for tasks assessed/performed                   Past Medical History:  Diagnosis Date   Allergic rhinitis due to pollen 11/21/2007   Brachial neuritis or radiculitis NOS    Cervicalgia    Concussion    age 48 - s/p accident   Costal chondritis    Depression    Essential and other specified forms of tremor    GERD (gastroesophageal reflux disease)    in past   Lumbago    Occlusion and stenosis of carotid artery without mention of cerebral infarction    Seizures [facility])    age 81 - after concussion   Past Surgical History:  Procedure Laterality Date   CATARACT EXTRACTION Right 07/18/14   CATARACT EXTRACTION W/PHACO Left 08/01/2014   Procedure: CATARACT EXTRACTION PHACO AND INTRAOCULAR LENS PLACEMENT ([place]);  Surgeon: Leandrew Koyanagi, MD;  Location: [place];  Service: Ophthalmology;  Laterality: Left;  IVA TOPICAL   COLONOSCOPY  2008   OTHER SURGICAL HISTORY  2002   ear surgery   STAPEDES SURGERY Right 2002   Patient Active Problem List   Diagnosis Date Noted   Dysphagia, pharyngeal phase 09/17/2021   Cervicalgia 08/05/2021   Spondylosis without myelopathy or radiculopathy, lumbosacral region 06/24/2021   Lumbar central  spinal stenosis, w/o neurogenic claudication (L4-5) 06/03/2021   Lumbar lateral recess stenosis (Bilateral: L2-3, L4-5) (Right: L3-4) 06/03/2021   Lumbosacral foraminal stenosis (Bilateral: L2-3, L3-4) (Left: L5-S1) 06/03/2021   Lumbar nerve root impingement (Right: L3 at L2-3 & L3-4) 06/03/2021   Ligamentum flavum hypertrophy (L3-4, L4-5) 06/03/2021   Abnormal MRI, lumbar spine (04/23/2021) 04/24/2021   Long term prescription benzodiazepine use (alprazolam) (Xanax) 03/24/2021   Grade 1 Retrolisthesis of L2/L3 (5 mm) and L3/L4 (3 mm) 03/24/2021   Levoscoliosis of lumbar spine (L3-4 apex) 03/24/2021   DDD (degenerative disc disease), lumbosacral 03/24/2021   Lumbosacral facet arthropathy (Left: L3-4, L4-5, and L5-S1) 03/24/2021   Tricompartment osteoarthritis of knee (Left) 03/24/2021   Baker cyst (Left) 03/24/2021   Chronic low back pain (1ry area of Pain) (Bilateral) (R>L) w/o sciatica 03/24/2021   Lumbar facet syndrome (Bilateral) 03/24/2021   Chronic lower extremity pain (2ry area of Pain) (Bilateral) (L>R) 03/24/2021   Lumbosacral radiculitis/sensory radiculopathy at L2 (Bilateral) 03/24/2021   Lumbosacral radiculitis/sensory radiculopathy at L3 (Bilateral) 03/24/2021   Chronic pain syndrome 03/23/2021   Pharmacologic therapy 03/23/2021   Disorder of skeletal system 03/23/2021   Problems influencing health status 03/23/2021   PAD (peripheral artery disease) ([place]) 10/01/2020   GAD (generalized anxiety disorder) 01/13/2018   MDD (major depressive disorder) 46/28/6381   Umbilical hernia  09/04/2017   Chronic knee pain (Left) 08/11/2017   Derangement of medial meniscus, posterior horn (Left) 08/11/2017   Vitamin D insufficiency 03/05/2016   BPH without obstruction/lower urinary tract symptoms 07/08/2015   Chronic fatigue 10/23/2014   Parkinson's disease ([place]) 09/20/2014   Major depression, recurrent, full remission ([place]) 07/26/2013   Unsteady gait 07/26/2013   Orthostatic hypotension  07/26/2013   Depression 07/26/2013   Anxiety 06/23/2013   Chronic anxiety 06/23/2013   Tremor 05/18/2013   Bilateral carotid artery stenosis 08/30/2012    ONSET DATE: 08/11/2021  REFERRING DIAG: Parkinsons Disease  THERAPY DIAG:  Difficulty in walking, not elsewhere classified  Muscle weakness (generalized)  Unsteadiness on feet  Abnormality of gait and mobility  Other abnormalities of gait and mobility  Atypical parkinsonism  Other lack of coordination  Rationale for Evaluation and Treatment Rehabilitation  SUBJECTIVE:                                                                                                                                                                                            SUBJECTIVE STATEMENT:  Pt reports ongoing LBP with some relief from recent injection. States doing okay otherwise  Pt accompanied by: significant other  PERTINENT HISTORY: Per chart history provided by Dr. Consuela Mimes- Mr. Manfred has Parkinson's disease ([place]); Chronic knee pain (Left); Derangement of medial meniscus, posterior horn (Left); PAD (peripheral artery disease) ([place]); Chronic pain syndrome; Grade 1 Retrolisthesis of L2/L3 (5 mm) and L3/L4 (3 mm); Levoscoliosis of lumbar spine (L3-4 apex); DDD (degenerative disc disease), lumbosacral; Lumbosacral facet arthropathy (Left: L3-4, L4-5, and L5-S1); Tricompartment osteoarthritis of knee (Left); Baker cyst (Left); Chronic low back pain (1ry area of Pain) (Bilateral) (R>L) w/o sciatica; Lumbar facet syndrome (Bilateral); Chronic lower extremity pain (2ry area of Pain) (Bilateral) (L>R); Lumbosacral radiculitis/sensory radiculopathy at L2 (Bilateral); Lumbosacral radiculitis/sensory radiculopathy at L3 (Bilateral); Abnormal MRI, lumbar spine (04/23/2021); Lumbosacral facet hypertrophy; Lumbar central spinal stenosis, w/o neurogenic claudication (L4-5); Lumbar lateral recess stenosis (Bilateral: L2-3, L4-5) (Right: L3-4);  Lumbosacral foraminal stenosis (Bilateral: L2-3, L3-4) (Left: L5-S1); Lumbar nerve root impingement (Right: L3 at L2-3 & L3-4); Ligamentum flavum hypertrophy (L3-4, L4-5); Spondylosis without myelopathy or radiculopathy, lumbosacral region; and Cervicalgia on their pertinent problem list   Patient has appointment for Epidural to low back next Tues 08/26/2021  PAIN:  Are you having pain? Yes: NPRS scale: 4/10 Pain location: low back Pain description: ache/soreness Aggravating factors: prolonged standing, transfers, walking Relieving factors: rest and medications  PRECAUTIONS: Fall  WEIGHT BEARING RESTRICTIONS No  FALLS: Has patient fallen in last 6 months? Yes. Number of falls 2  LIVING ENVIRONMENT: Lives with:  lives with their spouse Lives in: House/apartment Stairs: 4 Has following equipment at home: Ethel - 2 wheeled  PLOF: Needs assistance with ADLs- intermittent  PATIENT GOALS  to walk better and be as active as possible. Also to get in/out of cars better.   OBJECTIVE: (objective measures completed at initial evaluation unless otherwise dated)   DIAGNOSTIC FINDINGS: IMPRESSION: 1. Severe lumbar levoscoliosis with widespread advanced disc and facet degeneration. 2. Mild spinal stenosis at L4-5. 3. Moderate neural foraminal stenosis on the right at L3-4 and on the left at L5-S1. 4. Moderate right lateral recess stenosis at L2-3.     LOWER EXTREMITY MMT:    MMT (evaluation)  Right Eval Left Eval  Hip flexion 4 4  Hip extension 4 4  Hip abduction 4 4  Hip adduction 4 4  Hip internal rotation 4 4  Hip external rotation 4 4  Knee flexion 4 4  Knee extension 4 4  Ankle dorsiflexion 4 4  Ankle plantarflexion 4 4  Ankle inversion 4 4  Ankle eversion 4 4  (Blank rows = not tested)     FUNCTIONAL TESTS (EVALUATION):  5 times sit to stand: 14 sec without UE support Timed up and go (TUG): 15.67 sec with RW 10 meter walk test: 10.67 sec = 0.94 m/s using RW Berg  Balance Scale: 48/56- see flowsheet for details  PATIENT SURVEYS:  FOTO : 49 8/24: 47  TODAY'S TREATMENT:   Pillow placed on back of chair to allow support for patient's lower back region for all seated rest breaks as well as seated therapeutic exercises  There.ex:  Hamstring stretch, 30 sec bouts each LE x 3 sets each   Seated exercises:   Posture (legs abducted, hands on knees and upright posture, transitioning to forward bend and then back upright with arms and LE's abducted) x8 Sit to stand x 10 reps (BLE Only)  Ham curls BTB x 15 reps each LE Seated hip up/over hedgehog x 15 reps each LE Squat with arms by side - stand with UE overhead flexed (in // bars) x 12 reps Heel walk in // bars down and back x 3 trials Toe walk in // bars down and back x 3 trials Seated LAQ with hip add squeeze on soft red ball hold 3 sec x 10 reps each LE.  Side step up and over hurdle x 15 reps each LE Retro gait in // bars - (count steps to try to increase step length) x 4 total  Cone taps in // bars- 3 different colors- with PT calling out color and patient reacting by raising one LE to tap either ant/post/lateral while SLS on opp LE- x approx 3 min total involving each LE.    PATIENT EDUCATION: Education details: Pt educated throughout session about proper posture and technique with exercises. Improved exercise technique, movement at target joints, use of target muscles after min to mod verbal, visual, tactile cues.  Person educated: Patient Education method: Explanation, Demonstration, Tactile cues, and Verbal cues Education comprehension: verbalized understanding, returned demonstration, verbal cues required, tactile cues required, and needs further education   HOME EXERCISE PROGRAM: No updates on this date, pt to continue HEP as previously given: PWR! Basic 4 seated x 10 ea, handout provided: addended to not perform seated PRW! Rock exercise and added the below to program on 09/25/21   Access Code: [email or web address] URL: [email or web address] Date: 09/25/2021 Prepared by: Rivka Barbara  Exercises - Standing Romberg to 3/4 Tandem Stance  - 1  x daily - 7 x weekly - 2 sets - 30 seocnd hold - Standing March with Counter Support  - 1 x daily - 7 x weekly - 2 sets - 10 reps - Modified Single Leg Balance on Step  - 1 x daily - 7 x weekly - 2 sets - 30 second hold  Access Code: [email or web address] URL: [email or web address] Date: 10/21/2021 Prepared by: Janna Arch  Exercises - Seated Abdominal Press into [employer]  - 1 x daily - 7 x weekly - 2 sets - 10 reps - 5 hold - Seated Flexion Stretch with Swiss Ball  - 1 x daily - 7 x weekly - 2 sets - 10 reps - 5 hold  Access Code: [email or web address] URL: [email or web address] Date: 12/04/2021 Prepared by: Janna Arch  Exercises - Seated Hamstring Stretch  - 1 x daily - 7 x weekly - 2 sets - 2 reps - 30 hold - Standing Hamstring Stretch with Step  - 1 x daily - 7 x weekly - 2 sets - 2 reps - 30 hold  Access Code: [email or web address] URL: [email or web address] Date: 12/09/2021 Prepared by: Amalia Hailey  Exercises - Seated Alternating Side Stretch with Arm Overhead  - 1 x daily - 7 x weekly - 2 sets - 10 reps - Seated Thoracic Extension Arms Overhead  - 1 x daily - 7 x weekly - 2 sets - 10 reps  GOALS: Goals reviewed with patient? Yes  SHORT TERM GOALS: Target date: 10/02/2021  Pt will be independent with initial HEP in order to improve strength and balance in order to decrease fall risk and improve function at home and work.  Baseline: 08/21/2021-No formal HEP in place Goal status: MET    LONG TERM GOALS: Target date: 02/05/2022  Pt will improve BERG by at least 3 points in order to demonstrate clinically significant improvement in balance.    Baseline: 08/21/2021= 48/56 8/24:47/56; 11/13/2021= 47/56 Goal status: ongoing  2.  Pt will be independent with final HEP in order to improve strength  and balance in order to decrease fall risk and improve function at home and work.  Baseline: No HEP in place, 8/24: completing current HEP but still progressing; 11/13/2021- Patient continues to verbalize and demonstrate understanding of HEP for stretching/strengthening and mobility but still progressing and altering program as appropriate Goal status: IN PROGRESS  3.  Pt will decrease 5TSTS by at least 4 seconds in order to demonstrate clinically significant improvement in LE strength. Baseline: 08/21/2021= 19.01 sec without UE support 8/24:10.56 sec Goal status: MET  4.  Pt will decrease TUG to below 14 seconds/decrease in order to demonstrate decreased fall risk. Baseline:  08/21/2021=15.67 with RW 8/24: 12.41 Goal status: MET  5.  Pt will improve FOTO to target score of 56 to display perceived improvements in ability to complete ADL's.  Baseline: 08/21/2021=49 8/24: 45.6; 11/13/2021=46 Goal status: IN PROGRESS  6.  Pt will increase 10MWT by at least 0.15 m/s in order to demonstrate clinically significant improvement in community ambulation.   Baseline: 08/22/2021=0.94 m/s 8/24:1.20ms Goal status: MET  7 Pt will improve miniBESTest by at least 4 points to demonstrate clinically significant improvement in reduced falls with parkinsonian related deficits.  Baseline: 19/28; 11/13/2021- Unable to test due to time constraint- will test next session and update goal. 9/19: 18/28  Goal Status: ONGOING  8. Pt will increase 6MWT by at least [id number] order to demonstrate clinically significant improvement in cardiopulmonary endurance and community ambulation  Baseline: 11/13/2021= 650  feet with walker Goal Status: NEW  ASSESSMENT:  CLINICAL IMPRESSION:  Pt presents with excellent motivation for today's session.  Patient was able to participate in LE strengthening/balance without significant report of increased LBP and exercises were modified to avoid LBP.  Patient able to demo improving retro  steps with practice and well as improved SLS with cone tapping.  Pt will continue to benefit from skilled therapy to address remaining deficits in order to improve overall QoL and return to PLOF.      OBJECTIVE IMPAIRMENTS Abnormal gait, decreased activity tolerance, decreased balance, decreased coordination, decreased endurance, decreased knowledge of use of DME, decreased mobility, difficulty walking, decreased ROM, decreased strength, hypomobility, and impaired flexibility.   ACTIVITY LIMITATIONS carrying, lifting, bending, standing, squatting, sleeping, stairs, transfers, continence, bathing, toileting, and dressing  PARTICIPATION LIMITATIONS: cleaning, laundry, medication management, driving, shopping, community activity, and yard work  PERSONAL FACTORS Age are also affecting patient's functional outcome.   REHAB POTENTIAL: Good  CLINICAL DECISION MAKING: Stable/uncomplicated  EVALUATION COMPLEXITY: Low  PLAN: PT FREQUENCY: 2x/week  PT DURATION: 12 weeks  PLANNED INTERVENTIONS: Therapeutic exercises, Therapeutic activity, Neuromuscular re-education, Balance training, Gait training, Patient/Family education, Joint mobilization, Stair training, Vestibular training, Canalith repositioning, DME instructions, Dry Needling, Electrical stimulation, Spinal manipulation, and Spinal mobilization  PLAN FOR NEXT SESSION: dynamic cross body movements, continue balance training, monitor hypotension and orthostatics, ROM for flexibility, continue plan   Ollen Bowl, PT Physical Therapist- [facility]  01/02/22, 10:54 AM

## 2022-01-06 ENCOUNTER — Ambulatory Visit: Payer: Medicare HMO

## 2022-01-06 ENCOUNTER — Encounter: Payer: Medicare HMO | Admitting: Speech Pathology

## 2022-01-06 DIAGNOSIS — R2681 Unsteadiness on feet: Secondary | ICD-10-CM | POA: Diagnosis not present

## 2022-01-06 DIAGNOSIS — R262 Difficulty in walking, not elsewhere classified: Secondary | ICD-10-CM | POA: Diagnosis not present

## 2022-01-06 DIAGNOSIS — R269 Unspecified abnormalities of gait and mobility: Secondary | ICD-10-CM | POA: Diagnosis not present

## 2022-01-06 DIAGNOSIS — M6281 Muscle weakness (generalized): Secondary | ICD-10-CM | POA: Diagnosis not present

## 2022-01-06 DIAGNOSIS — R278 Other lack of coordination: Secondary | ICD-10-CM | POA: Diagnosis not present

## 2022-01-06 DIAGNOSIS — R2689 Other abnormalities of gait and mobility: Secondary | ICD-10-CM | POA: Diagnosis not present

## 2022-01-06 DIAGNOSIS — G20C Parkinsonism, unspecified: Secondary | ICD-10-CM | POA: Diagnosis not present

## 2022-01-06 NOTE — Therapy (Addendum)
[facility] OUTPATIENT PHYSICAL THERAPY NEURO TREATMENT/ Physical Therapy Progress Note  Dates of reporting period  11/27/21   to   01/06/22   Patient Name: Joseph Arroyo MRN: [id number] DOB:Apr 05, 1940, 81 y.o., male Today's Date: 01/06/2022  PCP: Olin Hauser, DO REFERRING PROVIDER: Lavena Bullion, NP   PT End of Session - 01/06/22 1654     Visit Number 30    Number of Visits 27    Date for PT Re-Evaluation 03/17/22    Authorization Type [employer]    Authorization Time Period Initial Cert 5/68/1275- 1/70/0174; Recert 9/44/9675-91/07/3844; PN 11/27/21    Progress Note Due on Visit 75    PT Start Time 1515    PT Stop Time 1559    PT Time Calculation (min) 44 min    Equipment Utilized During Treatment Gait belt    Behavior During Therapy [facility] for tasks assessed/performed             Past Medical History:  Diagnosis Date   Allergic rhinitis due to pollen 11/21/2007   Brachial neuritis or radiculitis NOS    Cervicalgia    Concussion    age 38 - s/p accident   Costal chondritis    Depression    Essential and other specified forms of tremor    GERD (gastroesophageal reflux disease)    in past   Lumbago    Occlusion and stenosis of carotid artery without mention of cerebral infarction    Seizures [facility])    age 38 - after concussion   Past Surgical History:  Procedure Laterality Date   CATARACT EXTRACTION Right 07/18/14   CATARACT EXTRACTION W/PHACO Left 08/01/2014   Procedure: CATARACT EXTRACTION PHACO AND INTRAOCULAR LENS PLACEMENT ([place]);  Surgeon: Leandrew Koyanagi, MD;  Location: [facility];  Service: Ophthalmology;  Laterality: Left;  IVA TOPICAL   COLONOSCOPY  2008   OTHER SURGICAL HISTORY  2002   ear surgery   STAPEDES SURGERY Right 2002   Patient Active Problem List   Diagnosis Date Noted   Dysphagia, pharyngeal phase 09/17/2021   Cervicalgia 08/05/2021   Spondylosis without myelopathy or radiculopathy, lumbosacral region 06/24/2021    Lumbar central spinal stenosis, w/o neurogenic claudication (L4-5) 06/03/2021   Lumbar lateral recess stenosis (Bilateral: L2-3, L4-5) (Right: L3-4) 06/03/2021   Lumbosacral foraminal stenosis (Bilateral: L2-3, L3-4) (Left: L5-S1) 06/03/2021   Lumbar nerve root impingement (Right: L3 at L2-3 & L3-4) 06/03/2021   Ligamentum flavum hypertrophy (L3-4, L4-5) 06/03/2021   Abnormal MRI, lumbar spine (04/23/2021) 04/24/2021   Long term prescription benzodiazepine use (alprazolam) (Xanax) 03/24/2021   Grade 1 Retrolisthesis of L2/L3 (5 mm) and L3/L4 (3 mm) 03/24/2021   Levoscoliosis of lumbar spine (L3-4 apex) 03/24/2021   DDD (degenerative disc disease), lumbosacral 03/24/2021   Lumbosacral facet arthropathy (Left: L3-4, L4-5, and L5-S1) 03/24/2021   Tricompartment osteoarthritis of knee (Left) 03/24/2021   Baker cyst (Left) 03/24/2021   Chronic low back pain (1ry area of Pain) (Bilateral) (R>L) w/o sciatica 03/24/2021   Lumbar facet syndrome (Bilateral) 03/24/2021   Chronic lower extremity pain (2ry area of Pain) (Bilateral) (L>R) 03/24/2021   Lumbosacral radiculitis/sensory radiculopathy at L2 (Bilateral) 03/24/2021   Lumbosacral radiculitis/sensory radiculopathy at L3 (Bilateral) 03/24/2021   Chronic pain syndrome 03/23/2021   Pharmacologic therapy 03/23/2021   Disorder of skeletal system 03/23/2021   Problems influencing health status 03/23/2021   PAD (peripheral artery disease) ([place]) 10/01/2020   GAD (generalized anxiety disorder) 01/13/2018   MDD (major depressive disorder) 65/99/3570   Umbilical  hernia 09/04/2017   Chronic knee pain (Left) 08/11/2017   Derangement of medial meniscus, posterior horn (Left) 08/11/2017   Vitamin D insufficiency 03/05/2016   BPH without obstruction/lower urinary tract symptoms 07/08/2015   Chronic fatigue 10/23/2014   Parkinson's disease ([place]) 09/20/2014   Major depression, recurrent, full remission ([place]) 07/26/2013   Unsteady gait 07/26/2013    Orthostatic hypotension 07/26/2013   Depression 07/26/2013   Anxiety 06/23/2013   Chronic anxiety 06/23/2013   Tremor 05/18/2013   Bilateral carotid artery stenosis 08/30/2012    ONSET DATE: 08/11/2021  REFERRING DIAG: Parkinsons Disease  THERAPY DIAG:  Difficulty in walking, not elsewhere classified  Muscle weakness (generalized)  Unsteadiness on feet  Rationale for Evaluation and Treatment Rehabilitation  SUBJECTIVE:                                                                                                                                                                                            SUBJECTIVE STATEMENT:  Patient reports they are experiencing increased low back pain today. Patient reports their LBP is currently at a 5/10 and at a 8/10 at it's worst over the course over the last week on the NPRS. The patient reports they were active this weekend and was able to lift at their gym. The patient reports they are still having some difficulty getting in and out of cars but states he has been implementing the knees over toes strategies he learned during skilled therapy and they it has been helping him.   Pt accompanied by: significant other  PERTINENT HISTORY: Per chart history provided by Dr. Consuela Mimes- Mr. Pablo has Parkinson's disease ([place]); Chronic knee pain (Left); Derangement of medial meniscus, posterior horn (Left); PAD (peripheral artery disease) ([place]); Chronic pain syndrome; Grade 1 Retrolisthesis of L2/L3 (5 mm) and L3/L4 (3 mm); Levoscoliosis of lumbar spine (L3-4 apex); DDD (degenerative disc disease), lumbosacral; Lumbosacral facet arthropathy (Left: L3-4, L4-5, and L5-S1); Tricompartment osteoarthritis of knee (Left); Baker cyst (Left); Chronic low back pain (1ry area of Pain) (Bilateral) (R>L) w/o sciatica; Lumbar facet syndrome (Bilateral); Chronic lower extremity pain (2ry area of Pain) (Bilateral) (L>R); Lumbosacral radiculitis/sensory radiculopathy at L2  (Bilateral); Lumbosacral radiculitis/sensory radiculopathy at L3 (Bilateral); Abnormal MRI, lumbar spine (04/23/2021); Lumbosacral facet hypertrophy; Lumbar central spinal stenosis, w/o neurogenic claudication (L4-5); Lumbar lateral recess stenosis (Bilateral: L2-3, L4-5) (Right: L3-4); Lumbosacral foraminal stenosis (Bilateral: L2-3, L3-4) (Left: L5-S1); Lumbar nerve root impingement (Right: L3 at L2-3 & L3-4); Ligamentum flavum hypertrophy (L3-4, L4-5); Spondylosis without myelopathy or radiculopathy, lumbosacral region; and Cervicalgia on their pertinent problem list   Patient has appointment for Epidural to low back next Tues  08/26/2021  PAIN:  Are you having pain? Yes: NPRS scale: 4/10 Pain location: low back Pain description: ache/soreness Aggravating factors: prolonged standing, transfers, walking Relieving factors: rest and medications  PRECAUTIONS: Fall  WEIGHT BEARING RESTRICTIONS No  FALLS: Has patient fallen in last 6 months? Yes. Number of falls 2  LIVING ENVIRONMENT: Lives with: lives with their spouse Lives in: House/apartment Stairs: 4 Has following equipment at home: Walker - 2 wheeled  PLOF: Needs assistance with ADLs- intermittent  PATIENT GOALS  to walk better and be as active as possible. Also to get in/out of cars better.   OBJECTIVE: (objective measures completed at initial evaluation unless otherwise dated)  DIAGNOSTIC FINDINGS: IMPRESSION: 1. Severe lumbar levoscoliosis with widespread advanced disc and facet degeneration. 2. Mild spinal stenosis at L4-5. 3. Moderate neural foraminal stenosis on the right at L3-4 and on the left at L5-S1. 4. Moderate right lateral recess stenosis at L2-3.  LOWER EXTREMITY MMT:    MMT (evaluation)  Right Eval Left Eval  Hip flexion 4 4  Hip extension 4 4  Hip abduction 4 4  Hip adduction 4 4  Hip internal rotation 4 4  Hip external rotation 4 4  Knee flexion 4 4  Knee extension 4 4  Ankle dorsiflexion 4 4   Ankle plantarflexion 4 4  Ankle inversion 4 4  Ankle eversion 4 4  (Blank rows = not tested)  FUNCTIONAL TESTS (EVALUATION):  5 times sit to stand: 14 sec without UE support Timed up and go (TUG): 15.67 sec with RW 10 meter walk test: 10.67 sec = 0.94 m/s using RW Berg Balance Scale: 48/56- see flowsheet for details  PATIENT SURVEYS:  FOTO : 49 8/24: 47  TODAY'S TREATMENT:   Progress Note Tests - MiniBESTest - 6 minute walk test   -see goals below for further details *Hot pack with a pillow placed on back of chair to decrease pain in the patient's lower back region during all seated rest breaks between tests*  PATIENT EDUCATION: Education details: Pt educated throughout session about proper posture and technique with exercises. Improved exercise technique, movement at target joints, use of target muscles after min to mod verbal, visual, tactile cues.  Person educated: Patient Education method: Explanation, Demonstration, Tactile cues, and Verbal cues Education comprehension: verbalized understanding, returned demonstration, verbal cues required, tactile cues required, and needs further education   HOME EXERCISE PROGRAM: No updates on this date, pt to continue HEP as previously given: PWR! Basic 4 seated x 10 ea, handout provided: addended to not perform seated PRW! Rock exercise and added the below to program on 09/25/21  Access Code: [email or web address] URL: [email or web address] Date: 09/25/2021 Prepared by: Rivka Barbara  Exercises - Standing Romberg to 3/4 Tandem Stance  - 1 x daily - 7 x weekly - 2 sets - 30 seocnd hold - Standing March with Counter Support  - 1 x daily - 7 x weekly - 2 sets - 10 reps - Modified Single Leg Balance on Step  - 1 x daily - 7 x weekly - 2 sets - 30 second hold  Access Code: [email or web address] URL: [email or web address] Date: 10/21/2021 Prepared by: Janna Arch  Exercises - Seated Abdominal Press into [employer]  - 1 x  daily - 7 x weekly - 2 sets - 10 reps - 5 hold - Seated Flexion Stretch with Swiss Ball  - 1 x daily - 7 x weekly - 2 sets - 10 reps - 5 hold  Access Code: [email or web address] URL:  [email or web address] Date: 12/04/2021 Prepared by: Janna Arch  Exercises - Seated Hamstring Stretch  - 1 x daily - 7 x weekly - 2 sets - 2 reps - 30 hold - Standing Hamstring Stretch with Step  - 1 x daily - 7 x weekly - 2 sets - 2 reps - 30 hold  Access Code: [email or web address] URL: [email or web address] Date: 12/09/2021 Prepared by: Amalia Hailey  Exercises - Seated Alternating Side Stretch with Arm Overhead  - 1 x daily - 7 x weekly - 2 sets - 10 reps - Seated Thoracic Extension Arms Overhead  - 1 x daily - 7 x weekly - 2 sets - 10 reps  GOALS: Goals reviewed with patient? Yes  SHORT TERM GOALS: Target date: 10/02/2021  Pt will be independent with initial HEP in order to improve strength and balance in order to decrease fall risk and improve function at home and work.  Baseline: 08/21/2021-No formal HEP in place Goal status: MET   LONG TERM GOALS: Target date: 03/08/22  Pt will improve BERG by at least 3 points in order to demonstrate clinically significant improvement in balance.   Baseline: 08/21/2021= 48/56 8/24:47/56; 11/13/2021= 47/56, 01/06/2022 = Unable to test due to time constraint- will test next session and update goal Goal status: ongoing  2.  Pt will be independent with final HEP in order to improve strength and balance in order to decrease fall risk and improve function at home and work.  Baseline: No HEP in place, 8/24: completing current HEP but still progressing; 11/13/2021- Patient continues to verbalize and demonstrate understanding of HEP for stretching/strengthening and mobility but still progressing and altering program as appropriate; 11/7: Continues follow current HEP but still progressing HEP as patient progresses  Goal status: IN PROGRESS  3.  Pt will decrease 5TSTS by  at least 4 seconds in order to demonstrate clinically significant improvement in LE strength. Baseline: 08/21/2021= 19.01 sec without UE support 8/24:10.56 sec Goal status: MET  4.  Pt will decrease TUG to below 14 seconds/decrease in order to demonstrate decreased fall risk. Baseline:  08/21/2021=15.67 with RW 8/24: 12.41 Goal status: MET  5.  Pt will improve FOTO to target score of 56 to display perceived improvements in ability to complete ADL's.  Baseline: 08/21/2021=49 8/24: 45.6; 11/13/2021=46, 01/06/2022 = 45.6 Goal status: IN PROGRESS  6.  Pt will increase 10MWT by at least 0.15 m/s in order to demonstrate clinically significant improvement in community ambulation.   Baseline: 08/22/2021=0.94 m/s 8/24:1.30ms Goal status: MET  7 Pt will improve miniBESTest by at least 4 points to demonstrate clinically significant improvement in reduced falls with parkinsonian related deficits.  Baseline: 19/28; 11/13/2021- Unable to test due to time constraint- will test next session and update goal. 9/19: 18/28; 01/06/2022 - 23/28 Goal Status: MET  8. Pt will increase 6MWT by at least [id number] order to demonstrate clinically significant improvement in cardiopulmonary endurance and community ambulation  Baseline: 11/13/2021= 650 feet with walker; 01/06/2022 = 1160 feet with walker.  Goal Status: MET   ASSESSMENT:  CLINICAL IMPRESSION:  The patient continues to present to skilled therapy with excellent motivation and eagerness to get better. The patient was able to participate in all progress note tests today without any significant report of increased LBP. The patient made significant improvement and achieved their goal in the 6 minute walk test improving from 650 ft to 1160 feet with their walker. The patient was not able to make improvement on their FOTO score but didn't  demonstrate any regression. The patient made clinically significant improvement on their miniBESTest meeting their goal of a 4  point improvement. New goals will be added during the patient's next recert. Overall, the patient continues to make amazing improvement toward his therapy goals and work hard at decreasing his risk of falls and maximizing his functional capacity for ADLs. Patient's condition has the potential to improve in response to therapy. Maximum improvement is yet to be obtained. The anticipated improvement is attainable and reasonable in a generally predictable time.     OBJECTIVE IMPAIRMENTS Abnormal gait, decreased activity tolerance, decreased balance, decreased coordination, decreased endurance, decreased knowledge of use of DME, decreased mobility, difficulty walking, decreased ROM, decreased strength, hypomobility, and impaired flexibility.   ACTIVITY LIMITATIONS carrying, lifting, bending, standing, squatting, sleeping, stairs, transfers, continence, bathing, toileting, and dressing  PARTICIPATION LIMITATIONS: cleaning, laundry, medication management, driving, shopping, community activity, and yard work  PERSONAL FACTORS Age are also affecting patient's functional outcome.   REHAB POTENTIAL: Good  CLINICAL DECISION MAKING: Stable/uncomplicated  EVALUATION COMPLEXITY: Low  PLAN: PT FREQUENCY: 2x/week  PT DURATION: 12 weeks  PLANNED INTERVENTIONS: Therapeutic exercises, Therapeutic activity, Neuromuscular re-education, Balance training, Gait training, Patient/Family education, Joint mobilization, Stair training, Vestibular training, Canalith repositioning, DME instructions, Dry Needling, Electrical stimulation, Spinal manipulation, and Spinal mobilization  PLAN FOR NEXT SESSION: dynamic cross body movements, continue balance training, monitor hypotension and orthostatics, ROM for flexibility, continue plan   Sudie Bailey SPT   This entire session was performed under direct supervision and direction of a licensed therapist/therapist assistant . I have personally read, edited and approve of  the note as written.  Janna Arch, PT, DPT  Physical Therapist- [facility]  01/06/22, 5:02 PM

## 2022-01-08 ENCOUNTER — Encounter: Payer: Medicare HMO | Admitting: Speech Pathology

## 2022-01-08 ENCOUNTER — Ambulatory Visit: Payer: Medicare HMO

## 2022-01-08 DIAGNOSIS — R278 Other lack of coordination: Secondary | ICD-10-CM | POA: Diagnosis not present

## 2022-01-08 DIAGNOSIS — R269 Unspecified abnormalities of gait and mobility: Secondary | ICD-10-CM | POA: Diagnosis not present

## 2022-01-08 DIAGNOSIS — R2681 Unsteadiness on feet: Secondary | ICD-10-CM | POA: Diagnosis not present

## 2022-01-08 DIAGNOSIS — R2689 Other abnormalities of gait and mobility: Secondary | ICD-10-CM | POA: Diagnosis not present

## 2022-01-08 DIAGNOSIS — R262 Difficulty in walking, not elsewhere classified: Secondary | ICD-10-CM

## 2022-01-08 DIAGNOSIS — G20C Parkinsonism, unspecified: Secondary | ICD-10-CM

## 2022-01-08 DIAGNOSIS — M6281 Muscle weakness (generalized): Secondary | ICD-10-CM

## 2022-01-08 NOTE — Therapy (Signed)
[facility] OUTPATIENT PHYSICAL THERAPY NEURO TREATMENT Patient Name: Joseph Arroyo MRN: [id number] DOB:15-Aug-1940, 81 y.o., male Today's Date: 01/09/2022  PCP: Olin Hauser, DO REFERRING PROVIDER: Lavena Bullion, NP   PT End of Session - 01/08/22 1554     Visit Number 31    Number of Visits 7    Date for PT Re-Evaluation 03/17/22    Authorization Type [employer]    Authorization Time Period Initial Cert 3/47/4259- 5/63/8756; Recert 4/33/2951-88/06/1658; PN 11/27/21    Progress Note Due on Visit 90    PT Start Time 1555    PT Stop Time 1639    PT Time Calculation (min) 44 min    Equipment Utilized During Treatment Gait belt    Behavior During Therapy [facility] for tasks assessed/performed             Past Medical History:  Diagnosis Date   Allergic rhinitis due to pollen 11/21/2007   Brachial neuritis or radiculitis NOS    Cervicalgia    Concussion    age 79 - s/p accident   Costal chondritis    Depression    Essential and other specified forms of tremor    GERD (gastroesophageal reflux disease)    in past   Lumbago    Occlusion and stenosis of carotid artery without mention of cerebral infarction    Seizures [facility])    age 26 - after concussion   Past Surgical History:  Procedure Laterality Date   CATARACT EXTRACTION Right 07/18/14   CATARACT EXTRACTION W/PHACO Left 08/01/2014   Procedure: CATARACT EXTRACTION PHACO AND INTRAOCULAR LENS PLACEMENT ([place]);  Surgeon: Leandrew Koyanagi, MD;  Location: [place];  Service: Ophthalmology;  Laterality: Left;  IVA TOPICAL   COLONOSCOPY  2008   OTHER SURGICAL HISTORY  2002   ear surgery   STAPEDES SURGERY Right 2002   Patient Active Problem List   Diagnosis Date Noted   Dysphagia, pharyngeal phase 09/17/2021   Cervicalgia 08/05/2021   Spondylosis without myelopathy or radiculopathy, lumbosacral region 06/24/2021   Lumbar central spinal stenosis, w/o neurogenic claudication (L4-5) 06/03/2021   Lumbar  lateral recess stenosis (Bilateral: L2-3, L4-5) (Right: L3-4) 06/03/2021   Lumbosacral foraminal stenosis (Bilateral: L2-3, L3-4) (Left: L5-S1) 06/03/2021   Lumbar nerve root impingement (Right: L3 at L2-3 & L3-4) 06/03/2021   Ligamentum flavum hypertrophy (L3-4, L4-5) 06/03/2021   Abnormal MRI, lumbar spine (04/23/2021) 04/24/2021   Long term prescription benzodiazepine use (alprazolam) (Xanax) 03/24/2021   Grade 1 Retrolisthesis of L2/L3 (5 mm) and L3/L4 (3 mm) 03/24/2021   Levoscoliosis of lumbar spine (L3-4 apex) 03/24/2021   DDD (degenerative disc disease), lumbosacral 03/24/2021   Lumbosacral facet arthropathy (Left: L3-4, L4-5, and L5-S1) 03/24/2021   Tricompartment osteoarthritis of knee (Left) 03/24/2021   Baker cyst (Left) 03/24/2021   Chronic low back pain (1ry area of Pain) (Bilateral) (R>L) w/o sciatica 03/24/2021   Lumbar facet syndrome (Bilateral) 03/24/2021   Chronic lower extremity pain (2ry area of Pain) (Bilateral) (L>R) 03/24/2021   Lumbosacral radiculitis/sensory radiculopathy at L2 (Bilateral) 03/24/2021   Lumbosacral radiculitis/sensory radiculopathy at L3 (Bilateral) 03/24/2021   Chronic pain syndrome 03/23/2021   Pharmacologic therapy 03/23/2021   Disorder of skeletal system 03/23/2021   Problems influencing health status 03/23/2021   PAD (peripheral artery disease) ([place]) 10/01/2020   GAD (generalized anxiety disorder) 01/13/2018   MDD (major depressive disorder) 63/03/6008   Umbilical hernia 93/23/5573   Chronic knee pain (Left) 08/11/2017   Derangement of medial meniscus, posterior horn (Left) 08/11/2017  Vitamin D insufficiency 03/05/2016   BPH without obstruction/lower urinary tract symptoms 07/08/2015   Chronic fatigue 10/23/2014   Parkinson's disease ([place]) 09/20/2014   Major depression, recurrent, full remission ([place]) 07/26/2013   Unsteady gait 07/26/2013   Orthostatic hypotension 07/26/2013   Depression 07/26/2013   Anxiety 06/23/2013   Chronic  anxiety 06/23/2013   Tremor 05/18/2013   Bilateral carotid artery stenosis 08/30/2012    ONSET DATE: 08/11/2021  REFERRING DIAG: Parkinsons Disease  THERAPY DIAG:  Difficulty in walking, not elsewhere classified  Muscle weakness (generalized)  Unsteadiness on feet  Abnormality of gait and mobility  Other abnormalities of gait and mobility  Atypical parkinsonism  Other lack of coordination  Rationale for Evaluation and Treatment Rehabilitation  SUBJECTIVE:                                                                                                                                                                                            SUBJECTIVE STATEMENT:  Patient reports continued low back pain= 7/10 today and has been more flared up this week. Reports returns to pain clinic next week. States despite his pain that he was pleased with his progress toward goals after last visit.   Pt accompanied by: significant other  PERTINENT HISTORY: Per chart history provided by Dr. Consuela Mimes- Mr. Meskill has Parkinson's disease ([place]); Chronic knee pain (Left); Derangement of medial meniscus, posterior horn (Left); PAD (peripheral artery disease) ([place]); Chronic pain syndrome; Grade 1 Retrolisthesis of L2/L3 (5 mm) and L3/L4 (3 mm); Levoscoliosis of lumbar spine (L3-4 apex); DDD (degenerative disc disease), lumbosacral; Lumbosacral facet arthropathy (Left: L3-4, L4-5, and L5-S1); Tricompartment osteoarthritis of knee (Left); Baker cyst (Left); Chronic low back pain (1ry area of Pain) (Bilateral) (R>L) w/o sciatica; Lumbar facet syndrome (Bilateral); Chronic lower extremity pain (2ry area of Pain) (Bilateral) (L>R); Lumbosacral radiculitis/sensory radiculopathy at L2 (Bilateral); Lumbosacral radiculitis/sensory radiculopathy at L3 (Bilateral); Abnormal MRI, lumbar spine (04/23/2021); Lumbosacral facet hypertrophy; Lumbar central spinal stenosis, w/o neurogenic claudication (L4-5); Lumbar  lateral recess stenosis (Bilateral: L2-3, L4-5) (Right: L3-4); Lumbosacral foraminal stenosis (Bilateral: L2-3, L3-4) (Left: L5-S1); Lumbar nerve root impingement (Right: L3 at L2-3 & L3-4); Ligamentum flavum hypertrophy (L3-4, L4-5); Spondylosis without myelopathy or radiculopathy, lumbosacral region; and Cervicalgia on their pertinent problem list   Patient has appointment for Epidural to low back next Tues 08/26/2021  PAIN:  Are you having pain? Yes: NPRS scale: 4/10 Pain location: low back Pain description: ache/soreness Aggravating factors: prolonged standing, transfers, walking Relieving factors: rest and medications  PRECAUTIONS: Fall  WEIGHT BEARING RESTRICTIONS No  FALLS: Has patient fallen in last 6 months? Yes. Number of falls  2  LIVING ENVIRONMENT: Lives with: lives with their spouse Lives in: House/apartment Stairs: 4 Has following equipment at home: Walker - 2 wheeled  PLOF: Needs assistance with ADLs- intermittent  PATIENT GOALS  to walk better and be as active as possible. Also to get in/out of cars better.   OBJECTIVE: (objective measures completed at initial evaluation unless otherwise dated)  DIAGNOSTIC FINDINGS: IMPRESSION: 1. Severe lumbar levoscoliosis with widespread advanced disc and facet degeneration. 2. Mild spinal stenosis at L4-5. 3. Moderate neural foraminal stenosis on the right at L3-4 and on the left at L5-S1. 4. Moderate right lateral recess stenosis at L2-3.  LOWER EXTREMITY MMT:    MMT (evaluation)  Right Eval Left Eval  Hip flexion 4 4  Hip extension 4 4  Hip abduction 4 4  Hip adduction 4 4  Hip internal rotation 4 4  Hip external rotation 4 4  Knee flexion 4 4  Knee extension 4 4  Ankle dorsiflexion 4 4  Ankle plantarflexion 4 4  Ankle inversion 4 4  Ankle eversion 4 4  (Blank rows = not tested)  FUNCTIONAL TESTS (EVALUATION):  5 times sit to stand: 14 sec without UE support Timed up and go (TUG): 15.67 sec with RW 10  meter walk test: 10.67 sec = 0.94 m/s using RW Berg Balance Scale: 48/56- see flowsheet for details  PATIENT SURVEYS:  FOTO : 49 8/24: 47  TODAY'S TREATMENT:    Therex:   LAQ 3# AW- 2 sets of 12 reps (VC to sit up straight and monitor for signs of increased low back pain- patient denied- able to hold 3 sec as well)  Hip flex 3# AW- 2 sets of 12 reps (VC to slow down for optimal muscle control)  Seated ham curl - BTB 2 sets of 15 reps Seated hip march up/over hedgehog 2 sets of 15 reps each LE Standing calf raise -2 sets of 15 reps BERG Balance test= 51/56    *Hot pack with a pillow placed on back of chair to decrease pain in the patient's lower back region during all seated rest breaks between tests*  PATIENT EDUCATION: Education details: Pt educated throughout session about proper posture and technique with exercises. Improved exercise technique, movement at target joints, use of target muscles after min to mod verbal, visual, tactile cues.  Person educated: Patient Education method: Explanation, Demonstration, Tactile cues, and Verbal cues Education comprehension: verbalized understanding, returned demonstration, verbal cues required, tactile cues required, and needs further education   HOME EXERCISE PROGRAM: No updates on this date, pt to continue HEP as previously given: PWR! Basic 4 seated x 10 ea, handout provided: addended to not perform seated PRW! Rock exercise and added the below to program on 09/25/21  Access Code: [email or web address] URL: [email or web address] Date: 09/25/2021 Prepared by: Rivka Barbara  Exercises - Standing Romberg to 3/4 Tandem Stance  - 1 x daily - 7 x weekly - 2 sets - 30 seocnd hold - Standing March with Counter Support  - 1 x daily - 7 x weekly - 2 sets - 10 reps - Modified Single Leg Balance on Step  - 1 x daily - 7 x weekly - 2 sets - 30 second hold  Access Code: [email or web address] URL: [email or web address] Date:  10/21/2021 Prepared by: Janna Arch  Exercises - Seated Abdominal Press into [employer]  - 1 x daily - 7 x weekly - 2 sets - 10 reps - 5 hold - Seated Flexion Stretch with Swiss Diona Foley  -  1 x daily - 7 x weekly - 2 sets - 10 reps - 5 hold  Access Code: [email or web address] URL: [email or web address] Date: 12/04/2021 Prepared by: Janna Arch  Exercises - Seated Hamstring Stretch  - 1 x daily - 7 x weekly - 2 sets - 2 reps - 30 hold - Standing Hamstring Stretch with Step  - 1 x daily - 7 x weekly - 2 sets - 2 reps - 30 hold  Access Code: [email or web address] URL: [email or web address] Date: 12/09/2021 Prepared by: Amalia Hailey  Exercises - Seated Alternating Side Stretch with Arm Overhead  - 1 x daily - 7 x weekly - 2 sets - 10 reps - Seated Thoracic Extension Arms Overhead  - 1 x daily - 7 x weekly - 2 sets - 10 reps  GOALS: Goals reviewed with patient? Yes  SHORT TERM GOALS: Target date: 10/02/2021  Pt will be independent with initial HEP in order to improve strength and balance in order to decrease fall risk and improve function at home and work.  Baseline: 08/21/2021-No formal HEP in place Goal status: MET   LONG TERM GOALS: Target date: 02/05/2022  Pt will improve BERG by at least 3 points in order to demonstrate clinically significant improvement in balance.   Baseline: 08/21/2021= 48/56 8/24:47/56; 11/13/2021= 47/56, 01/06/2022 = Unable to test due to time constraint- will test next session and update goal; 01/08/2022= 51/56- will keep goal active to ensure consistency. Goal status: ongoing  2.  Pt will be independent with final HEP in order to improve strength and balance in order to decrease fall risk and improve function at home and work.  Baseline: No HEP in place, 8/24: completing current HEP but still progressing; 11/13/2021- Patient continues to verbalize and demonstrate understanding of HEP for stretching/strengthening and mobility but still progressing and altering  program as appropriate; 11/7: Continues follow current HEP but still progressing HEP as patient progresses  Goal status: IN PROGRESS  3.  Pt will decrease 5TSTS by at least 4 seconds in order to demonstrate clinically significant improvement in LE strength. Baseline: 08/21/2021= 19.01 sec without UE support 8/24:10.56 sec Goal status: MET  4.  Pt will decrease TUG to below 14 seconds/decrease in order to demonstrate decreased fall risk. Baseline:  08/21/2021=15.67 with RW 8/24: 12.41 Goal status: MET  5.  Pt will improve FOTO to target score of 56 to display perceived improvements in ability to complete ADL's.  Baseline: 08/21/2021=49 8/24: 45.6; 11/13/2021=46, 01/06/2022 = 45.6 Goal status: IN PROGRESS  6.  Pt will increase 10MWT by at least 0.15 m/s in order to demonstrate clinically significant improvement in community ambulation.   Baseline: 08/22/2021=0.94 m/s 8/24:1.80ms Goal status: MET  7 Pt will improve miniBESTest by at least 4 points to demonstrate clinically significant improvement in reduced falls with parkinsonian related deficits.  Baseline: 19/28; 11/13/2021- Unable to test due to time constraint- will test next session and update goal. 9/19: 18/28; 01/06/2022 - 23/28 Goal Status: MET  8. Pt will increase 6MWT by at least [id number] order to demonstrate clinically significant improvement in cardiopulmonary endurance and community ambulation  Baseline: 11/13/2021= 650 feet with walker; 01/06/2022 = 1160 feet with walker.  Goal Status: MET   ASSESSMENT:  CLINICAL IMPRESSION:  The patient continues to present to skilled therapy with excellent motivation and eagerness to get better. Treatment was modified as patient was reporting overall increased Low back pain this week. Mostly sitting + the balance testing with rest in between and use of moist  heat with sitting. He continues to demonstrate good progress- improving his BERG balance test score - correlating to earlier testing  this week with Minibest. He stated he was having a good week other than pain and feels like he is doing better. Patient's condition has the potential to improve in response to therapy. Maximum improvement is yet to be obtained. The anticipated improvement is attainable and reasonable in a generally predictable time.     OBJECTIVE IMPAIRMENTS Abnormal gait, decreased activity tolerance, decreased balance, decreased coordination, decreased endurance, decreased knowledge of use of DME, decreased mobility, difficulty walking, decreased ROM, decreased strength, hypomobility, and impaired flexibility.   ACTIVITY LIMITATIONS carrying, lifting, bending, standing, squatting, sleeping, stairs, transfers, continence, bathing, toileting, and dressing  PARTICIPATION LIMITATIONS: cleaning, laundry, medication management, driving, shopping, community activity, and yard work  PERSONAL FACTORS Age are also affecting patient's functional outcome.   REHAB POTENTIAL: Good  CLINICAL DECISION MAKING: Stable/uncomplicated  EVALUATION COMPLEXITY: Low  PLAN: PT FREQUENCY: 2x/week  PT DURATION: 12 weeks  PLANNED INTERVENTIONS: Therapeutic exercises, Therapeutic activity, Neuromuscular re-education, Balance training, Gait training, Patient/Family education, Joint mobilization, Stair training, Vestibular training, Canalith repositioning, DME instructions, Dry Needling, Electrical stimulation, Spinal manipulation, and Spinal mobilization  PLAN FOR NEXT SESSION: dynamic cross body movements, continue balance training, monitor hypotension and orthostatics, ROM for flexibility, continue plan   Sudie Bailey SPT   This entire session was performed under direct supervision and direction of a licensed therapist/therapist assistant . I have personally read, edited and approve of the note as written.  Janna Arch, PT, DPT  Physical Therapist- [facility]  01/09/22, 6:03 AM

## 2022-01-09 ENCOUNTER — Other Ambulatory Visit: Payer: Self-pay | Admitting: Psychiatry

## 2022-01-09 DIAGNOSIS — F411 Generalized anxiety disorder: Secondary | ICD-10-CM

## 2022-01-09 DIAGNOSIS — F3342 Major depressive disorder, recurrent, in full remission: Secondary | ICD-10-CM

## 2022-01-12 ENCOUNTER — Encounter: Payer: Medicare HMO | Admitting: Occupational Therapy

## 2022-01-12 ENCOUNTER — Encounter: Payer: Self-pay | Admitting: Psychiatry

## 2022-01-12 ENCOUNTER — Ambulatory Visit (INDEPENDENT_AMBULATORY_CARE_PROVIDER_SITE_OTHER): Payer: Medicare HMO | Admitting: Psychiatry

## 2022-01-12 ENCOUNTER — Ambulatory Visit: Payer: Medicare HMO

## 2022-01-12 DIAGNOSIS — R2689 Other abnormalities of gait and mobility: Secondary | ICD-10-CM | POA: Diagnosis not present

## 2022-01-12 DIAGNOSIS — R262 Difficulty in walking, not elsewhere classified: Secondary | ICD-10-CM | POA: Diagnosis not present

## 2022-01-12 DIAGNOSIS — R69 Illness, unspecified: Secondary | ICD-10-CM | POA: Diagnosis not present

## 2022-01-12 DIAGNOSIS — R269 Unspecified abnormalities of gait and mobility: Secondary | ICD-10-CM | POA: Diagnosis not present

## 2022-01-12 DIAGNOSIS — F411 Generalized anxiety disorder: Secondary | ICD-10-CM

## 2022-01-12 DIAGNOSIS — R278 Other lack of coordination: Secondary | ICD-10-CM | POA: Diagnosis not present

## 2022-01-12 DIAGNOSIS — G894 Chronic pain syndrome: Secondary | ICD-10-CM

## 2022-01-12 DIAGNOSIS — G20C Parkinsonism, unspecified: Secondary | ICD-10-CM | POA: Diagnosis not present

## 2022-01-12 DIAGNOSIS — M6281 Muscle weakness (generalized): Secondary | ICD-10-CM | POA: Diagnosis not present

## 2022-01-12 DIAGNOSIS — R2681 Unsteadiness on feet: Secondary | ICD-10-CM | POA: Diagnosis not present

## 2022-01-12 DIAGNOSIS — F331 Major depressive disorder, recurrent, moderate: Secondary | ICD-10-CM | POA: Diagnosis not present

## 2022-01-12 MED ORDER — GABAPENTIN 100 MG PO CAPS: ORAL_CAPSULE | ORAL | 1 refills | Status: DC

## 2022-01-12 NOTE — Progress Notes (Signed)
Joseph Arroyo [id number] 1940-08-09 81 y.o.  Virtual Visit via Telephone Note  I connected with pt by telephone and verified that I am speaking with the correct person using two identifiers.   I discussed the limitations, risks, security and privacy concerns of performing an evaluation and management service by telephone and the availability of in person appointments. I also discussed with the patient that there may be a patient responsible charge related to this service. The patient expressed understanding and agreed to proceed.  I discussed the assessment and treatment plan with the patient. The patient was provided an opportunity to ask questions and all were answered. The patient agreed with the plan and demonstrated an understanding of the instructions.   The patient was advised to call back or seek an in-person evaluation if the symptoms worsen or if the condition fails to improve as anticipated.  I provided 30 minutes of non-face-to-face time during this encounter. The patient was located at home and the provider was located office. Session 1100-1130 am.  Subjective:   Patient ID:  Joseph Arroyo is a 81 y.o. (DOB Jun 19, 1940) male.  Chief Complaint:  Chief Complaint  Patient presents with   Follow-up   Anxiety   Depression    Depression        Associated symptoms include fatigue.  Past medical history includes anxiety.   Anxiety Patient reports no dizziness or palpitations.     Joseph ( lor-on) presents to the office today for follow-up of major depression and generalized anxiety disorder.    He was seen December, 2020 & June 2021.  No meds were changed.  Continued paroxetine 60 mg daily plus gabapentin 100 mg twice daily and Mirapex 0.125 mg 3 times daily.   08/2019 appt without med changes noted: Tolerates the meds well.  Wife thinks he's doing very well and he agrees.   No complaints.  2 D's.   Still exercising and has fatigue but can  function and enjoys activity. Restarted men's Bible study.  He feels positive and hopeful and useful.  At some point would like to be free of medication.  Does not feel guilty about the meds.    03/19/2020 appt noted:  Seen with wife Joseph Arroyo August and took Ivermectin and both had it mild. As far mood concerned he's doing well.  Wife agrees.  Not markedly depressed or anxious.  Feels the suffering of others.  Joseph Arroyo in hospital in [place].  Friend with Covid also. No episodes. Tolerating meds.  No Xanax used.  Attend [employer] in [place]. Usually sleep well but back pain can interfere with sleep. TX for PD and a bit slower per wife. Plan: Try increasing gabapentin to 100 mg AM & 200 mg PM for back pain.   Continue paroxetine 60 mg daily and Deplin 15 mg daily  10/03/2020 appointment with the following noted: Seen with wife, Joseph Arroyo Off and on increased gabapentin at PM to 200 mg for back pain and sleeps better.   Not depressed.  Anxiety is manageable.   Seeing chiropracter who he likes [name] in [place]. No SE noted with meds.  Perspire heavily. No panic attacks.   Sleep variable and is usually OK. Still leads men's Bible Study. Uses [employer] Plan: No med change  03/25/2021 appointment with the following noted: with wife Joseph Arroyo but came on quickly. Shaking with anxiety and been going on for 3-4 weeks. Rare use of Xanax. A lot of physical  px with constant pain with back. Pending workup.  Fell 3 weeks ago. T pain clinic now.  Everything seemed to pile up. Insomnia with mind racing with worry.  He can't tolerate negativity.   Rare panic. Consistent with paroxetine 60 mg daily. Consistent with gabapentin 100 AM and 200 PM Plan: For persistent symptoms of anxiety we will start off label trial of clonidine.  Cautioned about side effects.  05/13/2021 appointment with the following noted: wife Joseph Arroyo on call Taking clonidine 0.1 mg tablet 1/2 tablets twice  daily. Can't tell if there has been benefit from this.   Still taking Xanax but usually about one daily. Couldn't get here DT pain.  Been to pain clinic but no meds yet.  Problems with insurance company giving approval for the tests.  At times pain is excruciating.   BP has been OK.   Being cautious about getting up and protecting from dizziness. Tremors will wax and wane and worse with stress.   Fighting over the idea God does not want him to be like this and feels there is something wrong with him.   No SI  "that's not an option".   Plan: Continue paroxetine 60 mg daily DT PD hesitate to use atypicals for TR anxiety. Consider increasing gabapentin for anxiety off label.  Yes increase to 200 mg BID Ok Xanax  Clonidine 0.1 mg tablets 1/2 tablet twice daily    07/03/2021 appointment with the following noted: seen with Joseph Arroyo on call Using Xanax prn 1-2 daily. Increased gabapentin 100 mg AM and noon and 200 mg PM; hard to tell if it helped any but it might have.  No SE noted. Feels fine today and thinks there's improvement.  Believe the Reita Cliche is bringing him through this. Epidural recently.  Some pain relief. Per wife has had some anxious moments.   Tremors vary.   Some PT, balance a little worse since Xmas.   No low BP. When sleep is fine but 2-3 hours at a time and nocturia. He thinks depression is a lot better, today I feel good.  Needs to get out socially.  He's a people person.  She thinks today is best day since Jan.   Plan: Consider increasing gabapentin for anxiety off label.  Yes increase to 200 mg BID  09/09/2021 appointment with following noted: with wife Joseph Arroyo Doing ok thank you. A lot better with depression and anxiety. Gabapentin 100 mg AM and noon and 200 HS. Gets sleepy in Am after breakfast with Sinemet.  Takes a nap. Sleep pretty well. Disc scamming but they have been careful. Wife notices anxiety and tremors reenforce each other.  Only one Xanax daily.  Wants to go out  daily. Not attending main church but can go to house church and is getting out more. No SE except sleepiness. 3 back pain procedures with some benefit. Started speech therapy and OT and he likes to be active. Plan: no med changes Continur paroxetine 60 mg daily gabapentin 100 mg capsules 1 every morning and noon and 200 mg nightly,  01/12/22 appt noted:  ([place]) Continues alprazolam 0.25 mg 3 times daily as needed anxiety (taking 1 daily), clonidine 0.1 mg tablets one half twice daily, gabapentin 100 mg every morning and noon and 200 mg nightly, L-methylfolate, paroxetine 20 mg every morning and 40 mg nightly, pramipexole 0.125 mg nightly. Increased carbi-levo to 2 and 1/2 tab TID.   Vivid dreams.  Last night a disturbing one.  A couple of times in  past remotely acted out the dream.   Feeling tired with the meds.  Pain meds also make him tired and PD too.  Continues to believe God will heal him completely.  Getting facet blocks at the pain doc.  Continues shots. Depression is minimal but more anxiety than depression.  Will sometimes Xanax for tremor.  Not alsways used daily.  Finished speech therapy but still doing PT.  Is better when he goes out somewhere.  Family coming soon,  Sleep is otherwise OK.   Tendency to constipation or diarrhea.   Asks about natural meds to help his medical problems if possible.  He'd like some changes .   Church built him a ramp for his house.  Was 12 years free of medication until this last relapse and yet it took a long time to get relief again.  Wife, Joseph Arroyo, said overall he's been doing well.  No prn BZ in a couple of years.  Past Psychiatric Medication Trials: Under our care since 2013. Paroxetine 60, duloxetine, fluoxetine, Luvox, venlafaxine, Trintellix, ,  mirtazapine,doxepin,  Gabapentin 200 BID Deplin, clonazepam, lorazepam  lamotrigine,  buspirone,   Clonidine 0.05 BID Abilify, Seroquel, Zyprexa,  risperidone, Saphris,  lithium with some benefit,   Pramipexole   Review of Systems:  Review of Systems  Constitutional:  Positive for fatigue.  Cardiovascular:  Negative for palpitations.  Gastrointestinal:  Positive for diarrhea.  Musculoskeletal:  Positive for back pain and gait problem.  Neurological:  Positive for tremors and weakness. Negative for dizziness.  Psychiatric/Behavioral:  Negative for dysphoric mood.     Medications: I have reviewed the patient's current medications.  Current Outpatient Medications  Medication Sig Dispense Refill   ALPRAZolam (XANAX) 0.25 MG tablet Take 1 tablet (0.25 mg total) by mouth 3 (three) times daily as needed for anxiety. (Patient taking differently: Take 0.25 mg by mouth 3 (three) times daily as needed for anxiety. 1 daily) 90 tablet 1   B Complex Vitamins (VITAMIN B COMPLEX PO) Take 1 tablet by mouth daily.     baclofen (LIORESAL) 10 MG tablet Take 0.5-1 tablets (5-10 mg total) by mouth 3 (three) times daily as needed for muscle spasms. 90 each 1   Capsicum-Garlic 419-622 MG CAPS Take 200-300 mg by mouth.     carbidopa-levodopa (SINEMET IR) 25-100 MG tablet Take 2.5 tablets by mouth 3 (three) times daily.     cloNIDine (CATAPRES) 0.1 MG tablet 1/2 tablet twice daily 90 tablet 1   gabapentin (NEURONTIN) 100 MG capsule 2 CAPSULES twice daily (Patient taking differently: Take 100 mg by mouth. 1 in AM and noon and 2 at night) 270 capsule 1   HAWTHORNE BERRY PO Take by mouth daily.     L-Methylfolate 15 MG TABS Take 1 tablet (15 mg total) by mouth daily. 90 tablet 3   Lutein 10 MG TABS Take 1 tablet by mouth 3 (three) times daily. 1 daily     PARoxetine (PAXIL) 20 MG tablet TAKE 1 TABLET BY MOUTH EVERY DAY (WITH THE 40 MG TABLET FOR 60 MG TOTAL) 90 tablet 1   PARoxetine (PAXIL) 40 MG tablet TAKE 1 TABLET BY MOUTH EVERY DAY (WITH THE 20 MG TABLET FOR 60 MG TOTAL) 90 tablet 1   pramipexole (MIRAPEX) 0.125 MG tablet Take 0.125 mg by mouth at bedtime. 1 tab HS     PSYLLIUM HUSK PO Take by mouth.      Saw Palmetto, Serenoa repens, 1000 MG CAPS Take 2 capsules by mouth daily.  acetaminophen (TYLENOL) 500 MG tablet Take 1,000 mg by mouth every 8 (eight) hours as needed.     No current facility-administered medications for this visit.    Medication Side Effects: Fatigue and Other: from Mirapex  esp in AM  Allergies:  Allergies  Allergen Reactions   Prednisone     Hyperactivity    Wheat Bran Diarrhea    Past Medical History:  Diagnosis Date   Allergic rhinitis due to pollen 11/21/2007   Brachial neuritis or radiculitis NOS    Cervicalgia    Concussion    age 21 - s/p accident   Costal chondritis    Depression    Essential and other specified forms of tremor    GERD (gastroesophageal reflux disease)    in past   Lumbago    Occlusion and stenosis of carotid artery without mention of cerebral infarction    Seizures ([place])    age 86 - after concussion    Family History  Problem Relation Age of Onset   Heart failure Mother        enlarged heart    Asthma Father     Social History   Socioeconomic History   Marital status: Married    Spouse name: Joseph Arroyo Blatchley   Number of children: 2   Years of education: 12   Highest education level: Master's degree (e.g., MA, MS, MEng, MEd, MSW, MBA)  Occupational History   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[place])    Employer: RETIRED  Tobacco Use   Smoking status: Never   Smokeless tobacco: Never  Vaping Use   Vaping Use: Never used  Substance and Sexual Activity   Alcohol use: No    Alcohol/week: 0.0 standard drinks of alcohol   Drug use: No   Sexual activity: Not Currently  Other Topics Concern   Not on file  Social History Narrative   Married to [place], has 2 children, has grandchildren   Right handed   Master's plus   He is born in [place], heritage is [place] (Father's side), [place] (Mother's side)      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    Social Determinants of Health   Financial Resource Strain: Low  Risk  (10/13/2021)   Overall Financial Resource Strain (CARDIA)    Difficulty of Paying Living Expenses: Not hard at all  Food Insecurity: No Food Insecurity (10/13/2021)   Hunger Vital Sign    Worried About Running Out of Food in the Last Year: Never true    Ran Out of Food in the Last Year: Never true  Transportation Needs: No Transportation Needs (10/13/2021)   PRAPARE - Hydrologist (Medical): No    Lack of Transportation (Non-Medical): No  Physical Activity: Insufficiently Active (10/13/2021)   Exercise Vital Sign    Days of Exercise per Week: 3 days    Minutes of Exercise per Session: 30 min  Stress: No Stress Concern Present (10/13/2021)   [place]    Feeling of Stress : Not at all  Social Connections: [place] (10/13/2021)   Social Connection and Isolation Panel [NHANES]    Frequency of Communication with Friends and Family: More than three times a week    Frequency of Social Gatherings with Friends and Family: Never    Attends Religious Services: More than 4 times per year    Active Member of [employer] or Organizations: Yes    Attends Archivist Meetings: 1 to  4 times per year    Marital Status: Married  Human resources officer Violence: Not At Risk (08/24/2017)   Humiliation, Afraid, Rape, and Kick questionnaire    Fear of Current or Ex-Partner: No    Emotionally Abused: No    Physically Abused: No    Sexually Abused: No    Past Medical History, Surgical history, Social history, and Family history were reviewed and updated as appropriate.   Please see review of systems for further details on the patient's review from today.   Objective:   Physical Exam:  There were no vitals taken for this visit.  Physical Exam Neurological:     Mental Status: He is alert and oriented to person, place, and time.     Cranial Nerves: No dysarthria.  Psychiatric:        Attention  and Perception: Attention and perception normal.        Mood and Affect: Mood is anxious and depressed.        Speech: Speech normal.        Behavior: Behavior is cooperative.        Thought Content: Thought content normal. Thought content is not paranoid or delusional. Thought content does not include homicidal or suicidal ideation. Thought content does not include suicidal plan.        Cognition and Memory: Cognition and memory normal.        Judgment: Judgment normal.     Comments: Insight intact     Lab Review:     Component Value Date/Time   NA 133 (L) 10/10/2021 0843   NA 143 07/10/2015 0821   K 4.2 10/10/2021 0843   CL 97 (L) 10/10/2021 0843   CO2 31 10/10/2021 0843   GLUCOSE 85 10/10/2021 0843   BUN 20 10/10/2021 0843   BUN 13 07/10/2015 0821   CREATININE 0.77 10/10/2021 0843   CALCIUM 9.0 10/10/2021 0843   PROT 6.3 10/10/2021 0843   PROT 6.8 07/10/2015 0821   ALBUMIN 4.1 03/24/2021 1351   ALBUMIN 4.2 07/10/2015 0821   AST 16 10/10/2021 0843   ALT 6 (L) 10/10/2021 0843   ALKPHOS 51 03/24/2021 1351   BILITOT 0.7 10/10/2021 0843   BILITOT 0.5 07/10/2015 0821   GFRNONAA >60 03/24/2021 1351   GFRNONAA 86 09/07/2019 0941   GFRAA 100 09/07/2019 0941       Component Value Date/Time   WBC 5.6 10/10/2021 0843   RBC 4.42 10/10/2021 0843   HGB 14.5 10/10/2021 0843   HGB 15.3 10/23/2014 1237   HCT 42.3 10/10/2021 0843   HCT 45.1 10/23/2014 1237   PLT 193 10/10/2021 0843   PLT 150 10/23/2014 1237   MCV 95.7 10/10/2021 0843   MCV 88 10/23/2014 1237   MCH 32.8 10/10/2021 0843   MCHC 34.3 10/10/2021 0843   RDW 11.7 10/10/2021 0843   RDW 13.3 10/23/2014 1237   LYMPHSABS 1,210 10/10/2021 0843   LYMPHSABS 1.7 10/23/2014 1237   MONOABS 357 07/24/2016 1050   EOSABS 258 10/10/2021 0843   EOSABS 0.3 10/23/2014 1237   BASOSABS 62 10/10/2021 0843   BASOSABS 0.1 10/23/2014 1237    No results found for: "POCLITH", "LITHIUM"   No results found for: "PHENYTOIN",  "PHENOBARB", "VALPROATE", "CBMZ"   .res Assessment: Plan:    Joseph Arroyo was seen today for follow-up, anxiety and depression.  Diagnoses and all orders for this visit:  Major depressive disorder, recurrent episode, moderate (HCC)  GAD (generalized anxiety disorder)  Chronic pain syndrome    History  of extremely severe TR depression and anxiety with multiple med failures.  Doing well for 2-3 years.    Unfortunately he has had a severe relapse of anxiety over the last several weeks.  It is contributed to by pain and advancing Parkinson's disease.  He has been consistent with the paroxetine  Still works out at gym.  Disc pricing L-methylfolate with [facility]. Continue paroxetine 60 mg daily Hesitate to increase paroxetine right now   Parkiinson's is better with meds. But not gread. DT PD hesitate to use atypicals for TR anxiety.  Consider nortriptyline but this is more risky DT age.    Continue gabapentin for anxiety off label and pain 200 mg BID  Ok Xanax 0.25 mg TID prn.  He's not using much We discussed the short-term risks associated with benzodiazepines including sedation and increased fall risk among others.  Discussed long-term side effect risk including dependence, potential withdrawal symptoms, and the potential eventual dose-related risk of dementia.  But recent studies from 2020 dispute this association between benzodiazepines and dementia risk. Newer studies in 2020 do not support an association with dementia.  Clonidine 0.1 mg tablets 1/2 tablet twice daily   Disc SE incl fall risk.  Push fluids.  Fall precautions.  This is off label but often provides fairly rapid relief of anxiety.  He has alprazolam but it does not provide adequate control  No med changes today.  Tolerating and benefitting.' Disc fall risks.  Answered questions about Vitamin D.  He's taking 4000 units  FU 3-4 mos  Lynder Parents, MD, DFAPA   Please see After Visit Summary for patient specific  instructions.  Future Appointments  Date Time Provider [place]  01/12/2022  4:00 PM Zollie Pee, PT [facility] None  01/14/2022  2:30 PM Rivka Barbara B, PT [facility] None  01/15/2022  1:40 PM Milinda Pointer, MD [facility] None  01/20/2022  2:30 PM [facility] PT SUB THERAPIST 1 [facility] None  01/27/2022  1:45 PM [place], Kathlee Nations, PT [facility] None  01/29/2022  4:00 PM Lewis Moccasin, PT [facility] None  02/03/2022  2:30 PM [facility] PT SUB THERAPIST 1 [facility] None  02/05/2022  4:00 PM [place], Kathlee Nations, PT [facility] None  02/10/2022  3:15 PM Rivka Barbara B, PT [facility] None  02/12/2022  2:30 PM Rivka Barbara B, PT [facility] None  02/17/2022  2:30 PM Rivka Barbara B, PT [facility] None  02/19/2022  2:30 PM Rivka Barbara B, PT [facility] None  02/24/2022  2:30 PM Rivka Barbara B, PT [facility] None  02/26/2022  2:30 PM Rivka Barbara B, PT [facility] None  03/03/2022  2:30 PM Rivka Barbara B, PT [facility] None  03/05/2022  2:30 PM Rivka Barbara B, PT [facility] None  03/10/2022  2:30 PM Rivka Barbara B, PT [facility] None  03/12/2022  3:15 PM Rivka Barbara B, PT [facility] None  03/17/2022  2:30 PM Rivka Barbara B, PT [facility] None  03/19/2022  2:30 PM Rivka Barbara B, PT [facility] None  03/24/2022  2:30 PM Rivka Barbara B, PT [facility] None  03/26/2022  2:45 PM Rivka Barbara B, PT [facility] None  03/31/2022  2:30 PM Rivka Barbara B, PT [facility] None  04/02/2022  2:30 PM Rivka Barbara B, PT [facility] None  04/07/2022  2:30 PM Rivka Barbara B, PT [facility] None  04/09/2022  3:15 PM Rivka Barbara B, PT [facility] None  04/14/2022  2:30 PM Rivka Barbara B, PT [facility] None  04/16/2022  1:45 PM Particia Lather, PT [facility] None  04/21/2022  2:30 PM Rivka Barbara B, PT [facility] None  04/23/2022  3:15 PM Rivka Barbara B, PT [facility] None  04/28/2022  2:30 PM Rivka Barbara B, PT [facility]  None  04/30/2022  1:45 PM Rivka Barbara B, PT [facility] None  05/05/2022  2:30 PM Rivka Barbara B, PT [facility] None  05/07/2022  3:15 PM Rivka Barbara B, PT [facility] None  05/12/2022  2:30 PM Rivka Barbara B, PT [facility] None  05/14/2022  1:45 PM Rivka Barbara B, PT [facility] None  05/19/2022  2:30 PM Rivka Barbara B, PT [facility] None  05/21/2022  1:00 PM Rivka Barbara B, PT [facility] None    No orders of the defined types were placed in this encounter.    -------------------------------

## 2022-01-12 NOTE — Therapy (Signed)
[facility] OUTPATIENT PHYSICAL THERAPY NEURO TREATMENT Patient Name: Joseph Arroyo MRN: [id number] DOB:1940-11-26, 81 y.o., male Today's Date: 01/12/2022  PCP: Olin Hauser, DO REFERRING PROVIDER: Lavena Bullion, NP   PT End of Session - 01/12/22 1613     Visit Number 32    Number of Visits 40    Date for PT Re-Evaluation 03/17/22    Authorization Type [employer]    Authorization Time Period 11/13/2021-02/05/2022    Progress Note Due on Visit 40    PT Start Time 8527    PT Stop Time 1645    PT Time Calculation (min) 40 min    Equipment Utilized During Treatment Gait belt    Activity Tolerance Patient tolerated treatment well;No increased pain    Behavior During Therapy [facility] for tasks assessed/performed             Past Medical History:  Diagnosis Date   Allergic rhinitis due to pollen 11/21/2007   Brachial neuritis or radiculitis NOS    Cervicalgia    Concussion    age 62 - s/p accident   Costal chondritis    Depression    Essential and other specified forms of tremor    GERD (gastroesophageal reflux disease)    in past   Lumbago    Occlusion and stenosis of carotid artery without mention of cerebral infarction    Seizures [facility])    age 13 - after concussion   Past Surgical History:  Procedure Laterality Date   CATARACT EXTRACTION Right 07/18/14   CATARACT EXTRACTION W/PHACO Left 08/01/2014   Procedure: CATARACT EXTRACTION PHACO AND INTRAOCULAR LENS PLACEMENT ([place]);  Surgeon: Leandrew Koyanagi, MD;  Location: [place];  Service: Ophthalmology;  Laterality: Left;  IVA TOPICAL   COLONOSCOPY  2008   OTHER SURGICAL HISTORY  2002   ear surgery   STAPEDES SURGERY Right 2002   Patient Active Problem List   Diagnosis Date Noted   Dysphagia, pharyngeal phase 09/17/2021   Cervicalgia 08/05/2021   Spondylosis without myelopathy or radiculopathy, lumbosacral region 06/24/2021   Lumbar central spinal stenosis, w/o neurogenic claudication (L4-5)  06/03/2021   Lumbar lateral recess stenosis (Bilateral: L2-3, L4-5) (Right: L3-4) 06/03/2021   Lumbosacral foraminal stenosis (Bilateral: L2-3, L3-4) (Left: L5-S1) 06/03/2021   Lumbar nerve root impingement (Right: L3 at L2-3 & L3-4) 06/03/2021   Ligamentum flavum hypertrophy (L3-4, L4-5) 06/03/2021   Abnormal MRI, lumbar spine (04/23/2021) 04/24/2021   Long term prescription benzodiazepine use (alprazolam) (Xanax) 03/24/2021   Grade 1 Retrolisthesis of L2/L3 (5 mm) and L3/L4 (3 mm) 03/24/2021   Levoscoliosis of lumbar spine (L3-4 apex) 03/24/2021   DDD (degenerative disc disease), lumbosacral 03/24/2021   Lumbosacral facet arthropathy (Left: L3-4, L4-5, and L5-S1) 03/24/2021   Tricompartment osteoarthritis of knee (Left) 03/24/2021   Baker cyst (Left) 03/24/2021   Chronic low back pain (1ry area of Pain) (Bilateral) (R>L) w/o sciatica 03/24/2021   Lumbar facet syndrome (Bilateral) 03/24/2021   Chronic lower extremity pain (2ry area of Pain) (Bilateral) (L>R) 03/24/2021   Lumbosacral radiculitis/sensory radiculopathy at L2 (Bilateral) 03/24/2021   Lumbosacral radiculitis/sensory radiculopathy at L3 (Bilateral) 03/24/2021   Chronic pain syndrome 03/23/2021   Pharmacologic therapy 03/23/2021   Disorder of skeletal system 03/23/2021   Problems influencing health status 03/23/2021   PAD (peripheral artery disease) ([place]) 10/01/2020   GAD (generalized anxiety disorder) 01/13/2018   MDD (major depressive disorder) 78/24/2353   Umbilical hernia 61/44/3154   Chronic knee pain (Left) 08/11/2017   Derangement of medial meniscus,  posterior horn (Left) 08/11/2017   Vitamin D insufficiency 03/05/2016   BPH without obstruction/lower urinary tract symptoms 07/08/2015   Chronic fatigue 10/23/2014   Parkinson's disease ([place]) 09/20/2014   Major depression, recurrent, full remission (Joseph Arroyo) 07/26/2013   Unsteady gait 07/26/2013   Orthostatic hypotension 07/26/2013   Depression 07/26/2013   Anxiety  06/23/2013   Chronic anxiety 06/23/2013   Tremor 05/18/2013   Bilateral carotid artery stenosis 08/30/2012    ONSET DATE: 08/11/2021  REFERRING DIAG: Parkinsons Disease  THERAPY DIAG:  Difficulty in walking, not elsewhere classified  Muscle weakness (generalized)  Unsteadiness on feet  Abnormality of gait and mobility  Rationale for Evaluation and Treatment Rehabilitation  SUBJECTIVE:                                                                                                                                                                                            SUBJECTIVE STATEMENT: No updates today. He reports back is ~4-5/10, going back to see pain management soon   Pt accompanied by: significant other  PERTINENT HISTORY: Joseph Arroyo is a 77yoM who is referred to OPPT due to 2 falls in the setting of chronic parkinson's disease. Pt AMB with RW at baseline but is very physically active.    PAIN:  Are you having pain? Yes: NPRS scale: 4/10 Pain location: low back Pain description: ache/soreness Aggravating factors: prolonged standing, transfers, walking Relieving factors: rest and medications   PATIENT GOALS  to walk better and be as active as possible. Also to get in/out of cars better.   OBJECTIVE:   Therex:  *Hot pack with a pillow placed on back of chair to decrease pain in the patient's lower back region during all seated rest breaks between tests*  -STS from chair hands on knees 3x10, rest between  -standing ball self toss/catch 1x15, toss catch 1x20 to wife, 1x20 floor passes to wife  -in // bars airex to 6" step to airex to floor and repeat retro steps, the 6x lateral, then ball tosses on each of these surfaces to wife    PATIENT EDUCATION: Education details: Pt educated throughout session about proper posture and technique with exercises. Improved exercise technique, movement at target joints, use of target muscles after min to mod verbal,  visual, tactile cues.  Person educated: Patient Education method: Explanation, Demonstration, Tactile cues, and Verbal cues Education comprehension: verbalized understanding, returned demonstration, verbal cues required, tactile cues required, and needs further education   HOME EXERCISE PROGRAM:  Access Code: [email or web address] URL: [email or web address] Date: 12/09/2021 Prepared by: Amalia Hailey  Exercises - Seated Alternating Side Stretch with Arm Overhead  -  1 x daily - 7 x weekly - 2 sets - 10 reps - Seated Thoracic Extension Arms Overhead  - 1 x daily - 7 x weekly - 2 sets - 10 reps  GOALS: Goals reviewed with patient? Yes  SHORT TERM GOALS: Target date: 10/02/2021  Pt will be independent with initial HEP in order to improve strength and balance in order to decrease fall risk and improve function at home and work.  Baseline: 08/21/2021-No formal HEP in place Goal status: MET   LONG TERM GOALS: Target date: 02/05/2022  Pt will improve BERG by at least 3 points in order to demonstrate clinically significant improvement in balance.   Baseline: 08/21/2021= 48/56 8/24:47/56; 11/13/2021= 47/56, 01/06/2022 = Unable to test due to time constraint- will test next session and update goal; 01/08/2022= 51/56- will keep goal active to ensure consistency. Goal status: ongoing  2.  Pt will be independent with final HEP in order to improve strength and balance in order to decrease fall risk and improve function at home and work.  Baseline: No HEP in place, 8/24: completing current HEP but still progressing; 11/13/2021- Patient continues to verbalize and demonstrate understanding of HEP for stretching/strengthening and mobility but still progressing and altering program as appropriate; 11/7: Continues follow current HEP but still progressing HEP as patient progresses  Goal status: IN PROGRESS  3.  Pt will decrease 5TSTS by at least 4 seconds in order to demonstrate clinically significant  improvement in LE strength. Baseline: 08/21/2021= 19.01 sec without UE support 8/24:10.56 sec Goal status: MET  4.  Pt will decrease TUG to below 14 seconds/decrease in order to demonstrate decreased fall risk. Baseline:  08/21/2021=15.67 with RW 8/24: 12.41 Goal status: MET  5.  Pt will improve FOTO to target score of 56 to display perceived improvements in ability to complete ADL's.  Baseline: 08/21/2021=49 8/24: 45.6; 11/13/2021=46, 01/06/2022 = 45.6 Goal status: IN PROGRESS  6.  Pt will increase 10MWT by at least 0.15 m/s in order to demonstrate clinically significant improvement in community ambulation.   Baseline: 08/22/2021=0.94 m/s 8/24:1.58ms Goal status: MET  7 Pt will improve miniBESTest by at least 4 points to demonstrate clinically significant improvement in reduced falls with parkinsonian related deficits.  Baseline: 19/28; 11/13/2021- Unable to test due to time constraint- will test next session and update goal. 9/19: 18/28; 01/06/2022 - 23/28 Goal Status: MET  8. Pt will increase 6MWT by at least [id number] order to demonstrate clinically significant improvement in cardiopulmonary endurance and community ambulation  Baseline: 11/13/2021= 650 feet with walker; 01/06/2022 = 1160 feet with walker.  Goal Status: MET   ASSESSMENT:  CLINICAL IMPRESSION: Pt motivated to work on 'anything that will help with' his balance. Pain remains elevated but he is able to partake in interventions for brief periods with seated breaks between. Pt has several significant LOB posteriorly with his first 1-2 rises from chair of 10. Patient's condition has the potential to improve in response to therapy. Maximum improvement is yet to be obtained. The anticipated improvement is attainable and reasonable in a generally predictable time.     OBJECTIVE IMPAIRMENTS Abnormal gait, decreased activity tolerance, decreased balance, decreased coordination, decreased endurance, decreased knowledge of use of DME,  decreased mobility, difficulty walking, decreased ROM, decreased strength, hypomobility, and impaired flexibility.   ACTIVITY LIMITATIONS carrying, lifting, bending, standing, squatting, sleeping, stairs, transfers, continence, bathing, toileting, and dressing  PARTICIPATION LIMITATIONS: cleaning, laundry, medication management, driving, shopping, community activity, and yard work  PERSONAL FACTORS  Age are also affecting patient's functional outcome.   REHAB POTENTIAL: Good  CLINICAL DECISION MAKING: Stable/uncomplicated  EVALUATION COMPLEXITY: Low  PLAN: PT FREQUENCY: 2x/week  PT DURATION: 12 weeks  PLANNED INTERVENTIONS: Therapeutic exercises, Therapeutic activity, Neuromuscular re-education, Balance training, Gait training, Patient/Family education, Joint mobilization, Stair training, Vestibular training, Canalith repositioning, DME instructions, Dry Needling, Electrical stimulation, Spinal manipulation, and Spinal mobilization  PLAN FOR NEXT SESSION: balance  5:14 PM, 01/12/22 Etta Grandchild, PT, DPT Physical Therapist - [place] [phone]      01/12/22, 4:20 PM

## 2022-01-13 ENCOUNTER — Encounter: Payer: Medicare HMO | Admitting: Occupational Therapy

## 2022-01-13 ENCOUNTER — Encounter: Payer: Medicare HMO | Admitting: Speech Pathology

## 2022-01-14 ENCOUNTER — Encounter: Payer: Medicare HMO | Admitting: Occupational Therapy

## 2022-01-14 ENCOUNTER — Ambulatory Visit: Payer: Medicare HMO | Admitting: Physical Therapy

## 2022-01-15 ENCOUNTER — Ambulatory Visit: Payer: Medicare HMO | Attending: Pain Medicine | Admitting: Pain Medicine

## 2022-01-15 ENCOUNTER — Ambulatory Visit: Payer: Medicare HMO

## 2022-01-15 ENCOUNTER — Encounter: Payer: Medicare HMO | Admitting: Speech Pathology

## 2022-01-15 ENCOUNTER — Encounter: Payer: Self-pay | Admitting: Pain Medicine

## 2022-01-15 VITALS — BP 79/54 | HR 69 | Temp 97.2°F | Resp 16 | Ht 67.0 in | Wt 125.0 lb

## 2022-01-15 DIAGNOSIS — M4310 Spondylolisthesis, site unspecified: Secondary | ICD-10-CM | POA: Insufficient documentation

## 2022-01-15 DIAGNOSIS — G8929 Other chronic pain: Secondary | ICD-10-CM | POA: Diagnosis not present

## 2022-01-15 DIAGNOSIS — M47816 Spondylosis without myelopathy or radiculopathy, lumbar region: Secondary | ICD-10-CM | POA: Insufficient documentation

## 2022-01-15 DIAGNOSIS — M79605 Pain in left leg: Secondary | ICD-10-CM | POA: Insufficient documentation

## 2022-01-15 DIAGNOSIS — M545 Low back pain, unspecified: Secondary | ICD-10-CM | POA: Insufficient documentation

## 2022-01-15 DIAGNOSIS — M5137 Other intervertebral disc degeneration, lumbosacral region: Secondary | ICD-10-CM | POA: Diagnosis not present

## 2022-01-15 DIAGNOSIS — M4186 Other forms of scoliosis, lumbar region: Secondary | ICD-10-CM | POA: Insufficient documentation

## 2022-01-15 DIAGNOSIS — M79604 Pain in right leg: Secondary | ICD-10-CM | POA: Insufficient documentation

## 2022-01-15 NOTE — Progress Notes (Signed)
PROVIDER NOTE: Information contained herein reflects review and annotations entered in association with encounter. Interpretation of such information and data should be left to medically-trained personnel. Information provided to patient can be located elsewhere in the medical record under "Patient Instructions". Document created using STT-dictation technology, any transcriptional errors that may result from process are unintentional.    Patient: Joseph Arroyo  Service Category: E/M  Provider: Gaspar Cola, MD  DOB: [birth year]  DOS: 01/15/2022  Referring Provider: Nobie Putnam *  MRN: [id number]  Specialty: Interventional Pain Management  PCP: Olin Hauser, DO  Type: Established Patient  Setting: Ambulatory outpatient    Location: Office  Delivery: Face-to-face     HPI  Joseph Arroyo, a 81 y.o. year old male, is here today because of his Chronic bilateral low back pain without sciatica [M54.50, G89.29]. Joseph Arroyo's primary complain today is Back Pain (low) Last encounter: My last encounter with him was on 12/24/2021. Pertinent problems: Joseph Arroyo has Parkinson's disease ([place]); Chronic knee pain (Left); Derangement of medial meniscus, posterior horn (Left); PAD (peripheral artery disease) ([place]); Chronic pain syndrome; Grade 1 Retrolisthesis of L2/L3 (5 mm) and L3/L4 (3 mm); Levoscoliosis of lumbar spine (L3-4 apex); DDD (degenerative disc disease), lumbosacral; Lumbosacral facet arthropathy (Left: L3-4, L4-5, and L5-S1); Tricompartment osteoarthritis of knee (Left); Baker cyst (Left); Chronic low back pain (1ry area of Pain) (Bilateral) (R>L) w/o sciatica; Lumbar facet syndrome (Bilateral); Chronic lower extremity pain (2ry area of Pain) (Bilateral) (L>R); Lumbosacral radiculitis/sensory radiculopathy at L2 (Bilateral); Lumbosacral radiculitis/sensory radiculopathy at L3 (Bilateral); Abnormal MRI, lumbar spine (04/23/2021); Lumbar central spinal stenosis,  w/o neurogenic claudication (L4-5); Lumbar lateral recess stenosis (Bilateral: L2-3, L4-5) (Right: L3-4); Lumbosacral foraminal stenosis (Bilateral: L2-3, L3-4) (Left: L5-S1); Lumbar nerve root impingement (Right: L3 at L2-3 & L3-4); Ligamentum flavum hypertrophy (L3-4, L4-5); Spondylosis without myelopathy or radiculopathy, lumbosacral region; and Cervicalgia on their pertinent problem list. Pain Assessment: Severity of Chronic pain is reported as a 5 /10. Location: Back Right, Left/denies. Onset: More than a month ago. Quality: Discomfort, Constant. Timing: Constant. Modifying factor(s): meds, procedures. Vitals:  height is _0  (1.702 m) and weight is 125 lb (56.7 kg). His temperature is 97.2 F (36.2 C) (abnormal). His blood pressure is 79/54 (abnormal) and his pulse is 69. His respiration is 16 and oxygen saturation is 97%.   Reason for encounter: post-procedure evaluation and assessment.  Today as usual, we spent quite a bit of time going over his results and the problems with the treatment of his condition.  His chronic pain syndrome is a complex one involving multiple sites.  During the patient's initial evaluation he indicated his low back pain to be his primary area of pain followed by both lower extremities, followed by his left heel.  Because it did involve lower extremity symptoms we went ahead and did a right-sided L2-3 LESI which put him on his road to improvement.  After that initially LESI his lower extremity symptoms began to subside after this recent a bilateral lumbar facet block was done which provided him with some relief of the lower back pain, but more important he completely eliminated his lower extremity pain.  However, because it did not completely eliminate the pain in the lower back, decision was made to go back to the upper lumbar region and do a right-sided L1-2 LESI.  This treatment provided him with 100% relief of the pain for the duration of the local anesthetic, again  confirming that his low back  pain was multifactorial.  In an effort to further elucidate whether the pain was of intraspinal origin versus originating from the posterior elements, a diagnostic bilateral lumbar facet MBB of the L2-3 and L3-4 levels was conducted.  Today he returns indicating that he attained 75% improvement for the duration of the local anesthetic, followed by a persistent 75% improvement that lasted for approximately 2 weeks.  This has helped narrow down the affected area.  Unfortunately we had significant problems with his insurance company denying the facet blocks and at this point we find ourselves needing some further relief, but secondary to the insurance restrictions we may have to settle for doing a repeat right-sided L1-2 LESI.  The planned was shared with the patient and his wife who understood and accepted.  Aside from the above, we also had some additional discussions in terms of other alternatives, and we have decided to do some trials of Arnica cream vs capsaicin cream.  They will be getting this over-the-counter and I have instructed them to report back to me the results.  Post-procedure evaluation   Type: Lumbar Facet, Medial Branch Block(s) #1  Laterality: Bilateral  Level: L1, L2, L3, & L4 Medial Branch Level(s). Injecting these levels blocks the L2-3 & L3-4  lumbar facet joints. Imaging: Fluoroscopic guidance         Anesthesia: Local anesthesia (1-2% Lidocaine) Anxiolysis: IV Versed         Sedation: No Sedation                       DOS: 12/23/2021 Performed by: Gaspar Cola, MD  Primary Purpose: Diagnostic/Therapeutic Indications: Low back pain severe enough to impact quality of life or function. 1. Lumbar facet syndrome (Bilateral)   2. Lumbosacral facet arthropathy (Left: L3-4, L4-5, and L5-S1)   3. Grade 1 Retrolisthesis of L2/L3 (5 mm) and L3/L4 (3 mm)   4. DDD (degenerative disc disease), lumbosacral   5. Spondylosis without myelopathy or  radiculopathy, lumbosacral region   6. Chronic low back pain (1ry area of Pain) (Bilateral) (R>L) w/o sciatica    NAS-11 Pain score:   Pre-procedure: 5 /10   Post-procedure: 2 /10      Effectiveness:  Initial hour after procedure: 75 %. Subsequent 4-6 hours post-procedure: 75 %. Analgesia past initial 6 hours: 75 % (lasting 2 weeks). Ongoing improvement:  Analgesic: The pain and indicates having attained 75% relief of his back pain for approximately 2 weeks.  Previously we had done facet medial branch blocks of the L4-5 and L5-S1 and this last time we did medial branch blocks for the L2-3 and L3-4 facet joints.  Because the patient has such severe degenerative joint disease of the lumbar spine secondary to his scoliosis, he has problems with the all of the facet joints in the lumbar region.  Because of restrictions from [employer], we are unable to block all of them at the same time and therefore he would be a logical to expect that he will be attaining 100% relief of the pain when we are in fact blocking only 50% of the affected levels. Function: Back to baseline ROM: Back to baseline  Pharmacotherapy Assessment  Analgesic: None MME/day: 0 mg/day   Monitoring: [place] PMP: PDMP reviewed during this encounter.       Pharmacotherapy: No side-effects or adverse reactions reported. Compliance: No problems identified. Effectiveness: Clinically acceptable.  Dewayne Shorter, RN  01/15/2022  1:55 PM  Sign when Signing Visit  Safety precautions to be maintained throughout the outpatient stay will include: orient to surroundings, keep bed in low position, maintain call bell within reach at all times, provide assistance with transfer out of bed and ambulation.    No results found for: "CBDTHCR" No results found for: "D8THCCBX" No results found for: "D9THCCBX"  UDS:  Summary  Date Value Ref Range Status  03/25/2021 Note  Final    Comment:     ==================================================================== Compliance Drug Analysis, Ur ==================================================================== Test                             Result       Flag       Units  Drug Present and Declared for Prescription Verification   Gabapentin                     PRESENT      EXPECTED   Baclofen                       PRESENT      EXPECTED   Paroxetine                     PRESENT      EXPECTED   Ibuprofen                      PRESENT      EXPECTED  Drug Present not Declared for Prescription Verification   Acetaminophen                  PRESENT      UNEXPECTED  Drug Absent but Declared for Prescription Verification   Alprazolam                     Not Detected UNEXPECTED ng/mg creat ==================================================================== Test                      Result    Flag   Units      Ref Range   Creatinine              122              mg/dL      >=20 ==================================================================== Declared Medications:  The flagging and interpretation on this report are based on the  following declared medications.  Unexpected results may arise from  inaccuracies in the declared medications.   **Note: The testing scope of this panel includes these medications:   Alprazolam  Baclofen (Lioresal)  Gabapentin (Neurontin)  Paroxetine (Paxil)   **Note: The testing scope of this panel does not include small to  moderate amounts of these reported medications:   Ibuprofen (Advil)   **Note: The testing scope of this panel does not include the  following reported medications:   Betamethasone (Lotrisone)  Carbidopa (Sinemet)  Clotrimazole (Lotrisone)  Folic Acid  Levodopa (Sinemet)  Lutein  Magnesium  Omega-3 Fatty Acids  Pramipexole (Mirapex)  Supplement  Ubiquinone (CoQ10)  Vitamin B ==================================================================== For clinical consultation,  please call [phone]. ====================================================================       ROS  Constitutional: Denies any fever or chills Gastrointestinal: No reported hemesis, hematochezia, vomiting, or acute GI distress Musculoskeletal: Denies any acute onset joint swelling, redness, loss of ROM, or weakness Neurological: No reported episodes of acute onset apraxia, aphasia, dysarthria, agnosia, amnesia, paralysis, loss of coordination, or  loss of consciousness  Medication Review  ALPRAZolam, B Complex Vitamins, Capsicum-Garlic, [place], L-Methylfolate, Lutein, PARoxetine, Psyllium, Saw Palmetto (Serenoa repens), acetaminophen, baclofen, carbidopa-levodopa, cloNIDine, gabapentin, and pramipexole  History Review  Allergy: Joseph Arroyo is allergic to prednisone and wheat bran. Drug: Joseph Arroyo  reports no history of drug use. Alcohol:  reports no history of alcohol use. Tobacco:  reports that he has never smoked. He has never used smokeless tobacco. Social: Joseph Arroyo  reports that he has never smoked. He has never used smokeless tobacco. He reports that he does not drink alcohol and does not use drugs. Medical:  has a past medical history of Allergic rhinitis due to pollen (11/21/2007), Brachial neuritis or radiculitis NOS, Cervicalgia, Concussion, Costal chondritis, Depression, Essential and other specified forms of tremor, GERD (gastroesophageal reflux disease), Lumbago, Occlusion and stenosis of carotid artery without mention of cerebral infarction, and Seizures ([place]). Surgical: Joseph Arroyo  has a past surgical history that includes Other surgical history (2002); Colonoscopy (2008); Stapedes surgery (Right, 2002); Cataract extraction (Right, 07/18/14); and Cataract extraction w/PHACO (Left, 08/01/2014). Family: family history includes Asthma in his father; Heart failure in his mother.  Laboratory Chemistry Profile   Renal Lab Results  Component Value Date   BUN  20 10/10/2021   CREATININE 0.77 10/10/2021   BCR SEE NOTE: 10/10/2021   GFRAA 100 09/07/2019   GFRNONAA >60 03/24/2021    Hepatic Lab Results  Component Value Date   AST 16 10/10/2021   ALT 6 (L) 10/10/2021   ALBUMIN 4.1 03/24/2021   ALKPHOS 51 03/24/2021    Electrolytes Lab Results  Component Value Date   NA 133 (L) 10/10/2021   K 4.2 10/10/2021   CL 97 (L) 10/10/2021   CALCIUM 9.0 10/10/2021   MG 2.0 03/24/2021    Bone Lab Results  Component Value Date   VD25OH 42 10/10/2021   25OHVITD1 32 03/24/2021   25OHVITD2 <1.0 03/24/2021   25OHVITD3 31 03/24/2021    Inflammation (CRP: Acute Phase) (ESR: Chronic Phase) Lab Results  Component Value Date   CRP 0.5 03/24/2021   ESRSEDRATE 4 03/24/2021         Note: Above Lab results reviewed.  Recent Imaging Review  DG PAIN CLINIC C-ARM 1-60 MIN NO REPORT Fluoro was used, but no Radiologist interpretation will be provided.  Please refer to "NOTES" tab for provider progress note. Note: Reviewed        Physical Exam  General appearance: Well nourished, well developed, and well hydrated. In no apparent acute distress Mental status: Alert, oriented x 3 (person, place, & time)       Respiratory: No evidence of acute respiratory distress Eyes: PERLA Vitals: BP (!) 79/54   Pulse 69   Temp (!) 97.2 F (36.2 C)   Resp 16   Ht _0  (1.702 m)   Wt 125 lb (56.7 kg)   SpO2 97%   BMI 19.58 kg/m  BMI: Estimated body mass index is 19.58 kg/m as calculated from the following:   Height as of this encounter: _1  (1.702 m).   Weight as of this encounter: 125 lb (56.7 kg). Ideal: Ideal body weight: 66.1 kg (145 lb 11.6 oz)  Assessment   Diagnosis Status  1. Chronic low back pain (1ry area of Pain) (Bilateral) (R>L) w/o sciatica   2. Chronic lower extremity pain (2ry area of Pain) (Bilateral) (L>R)   3. Lumbar facet syndrome (Bilateral)   4. Grade 1 Retrolisthesis of L2/L3 (5 mm) and L3/L4 (3  mm)   5. Levoscoliosis of lumbar  spine (L3-4 apex)   6. DDD (degenerative disc disease), lumbosacral    Controlled Controlled Controlled   Updated Problems: No problems updated.  Plan of Care  Problem-specific:  No problem-specific Assessment & Plan notes found for this encounter.  Joseph Arroyo has a current medication list which includes the following long-term medication(s): clonidine, gabapentin, paroxetine, and paroxetine.  Pharmacotherapy (Medications Ordered): No orders of the defined types were placed in this encounter.  Orders:  Orders Placed This Encounter  Procedures   Lumbar Epidural Injection    Standing Status:   Future    Standing Expiration Date:   04/17/2022    Scheduling Instructions:     Procedure: Interlaminar Lumbar Epidural Steroid injection (LESI)  L1-2     Laterality: Right-sided     Sedation: Patient's choice.     Timeframe: ASAA    Order Specific Question:   Where will this procedure be performed?    Answer:   [facility]   Follow-up plan:   Return for [facility]): (R) L1-2 LESI #2.     Interventional Therapies  Risk  Complexity Considerations:   Estimated body mass index is 20.98 kg/m as calculated from the following:   Height as of this encounter: _0  (1.727 m).   Weight as of this encounter: 138 lb (62.6 kg).    Levoscoliosis (L3-4 apex)  Parkinson's  SENSITIVITY: Oral Prednisone (Hyperactivity)   Planned  Pending:   Diagnostic right L1-2 LESI #2    Under consideration:   Diagnostic bilateral L2-3 & L3-4 lumbar facet MBB #1  Diagnostic x-rays of the cervical spine for further evaluation of his cervicalgia.   Completed:   Diagnostic bilateral L2-3 & L3-4 Facet Blk (L1, L2, L3, & L4 MBB) x1 (75/75/75 x2 weeks)  Diagnostic bilateral lumbar facet (L2-S1) MBB x1 (07/08/2021) (50/50/50/<50)  Diagnostic right L1-2 LESI x1 (08/26/2021) (100/100/50/50)  Diagnostic right L2-3 LESI x1 (06/03/2021) (100/100/45/45)  Referral to physical therapy for LB PT as  well as a back brace eval. (04/28/2021) (water aerobics)    Therapeutic  Palliative (PRN) options:   None established    Recent Visits Date Type Provider Dept  12/23/21 Procedure visit Milinda Pointer, MD [facility]  12/10/21 Office Visit Milinda Pointer, MD [facility]  Showing recent visits within past 90 days and meeting all other requirements Today's Visits Date Type Provider Dept  01/15/22 Office Visit Milinda Pointer, MD [facility]  Showing today's visits and meeting all other requirements Future Appointments No visits were found meeting these conditions. Showing future appointments within next 90 days and meeting all other requirements  I discussed the assessment and treatment plan with the patient. The patient was provided an opportunity to ask questions and all were answered. The patient agreed with the plan and demonstrated an understanding of the instructions.  Patient advised to call back or seek an in-person evaluation if the symptoms or condition worsens.  Duration of encounter: 50 minutes.  Total time on encounter, as per AMA guidelines included both the face-to-face and non-face-to-face time personally spent by the physician and/or other qualified health care professional(s) on the day of the encounter (includes time in activities that require the physician or other qualified health care professional and does not include time in activities normally performed by clinical staff). Physician's time may include the following activities when performed: preparing to see the patient (eg, review of tests, pre-charting review of records) obtaining and/or  reviewing separately obtained history performing a medically appropriate examination and/or evaluation counseling and educating the patient/family/caregiver ordering medications, tests, or procedures referring and communicating with other health care professionals (when not separately  reported) documenting clinical information in the electronic or other health record independently interpreting results (not separately reported) and communicating results to the patient/ family/caregiver care coordination (not separately reported)  Note by: Gaspar Cola, MD Date: 01/15/2022; Time: 4:08 PM

## 2022-01-15 NOTE — Progress Notes (Signed)
Safety precautions to be maintained throughout the outpatient stay will include: orient to surroundings, keep bed in low position, maintain call bell within reach at all times, provide assistance with transfer out of bed and ambulation.  

## 2022-01-15 NOTE — Patient Instructions (Signed)

## 2022-01-18 NOTE — Therapy (Signed)
[facility] OUTPATIENT PHYSICAL THERAPY NEURO TREATMENT Patient Name: Joseph Arroyo MRN: [id number] DOB:07-07-1940, 81 y.o., male Today's Date: 01/20/2022  PCP: Olin Hauser, DO REFERRING PROVIDER: Lavena Bullion, NP   PT End of Session - 01/19/22 1519     Visit Number 33    Number of Visits 40    Date for PT Re-Evaluation 03/17/22    Authorization Type [employer]    Authorization Time Period 11/13/2021-02/05/2022    Progress Note Due on Visit 40    PT Start Time 1515    PT Stop Time 1558    PT Time Calculation (min) 43 min    Equipment Utilized During Treatment Gait belt    Activity Tolerance Patient tolerated treatment well;No increased pain    Behavior During Therapy [facility] for tasks assessed/performed             Past Medical History:  Diagnosis Date   Allergic rhinitis due to pollen 11/21/2007   Brachial neuritis or radiculitis NOS    Cervicalgia    Concussion    age 31 - s/p accident   Costal chondritis    Depression    Essential and other specified forms of tremor    GERD (gastroesophageal reflux disease)    in past   Lumbago    Occlusion and stenosis of carotid artery without mention of cerebral infarction    Seizures [facility])    age 5 - after concussion   Past Surgical History:  Procedure Laterality Date   CATARACT EXTRACTION Right 07/18/14   CATARACT EXTRACTION W/PHACO Left 08/01/2014   Procedure: CATARACT EXTRACTION PHACO AND INTRAOCULAR LENS PLACEMENT ([place]);  Surgeon: Leandrew Koyanagi, MD;  Location: [place];  Service: Ophthalmology;  Laterality: Left;  IVA TOPICAL   COLONOSCOPY  2008   OTHER SURGICAL HISTORY  2002   ear surgery   STAPEDES SURGERY Right 2002   Patient Active Problem List   Diagnosis Date Noted   Dysphagia, pharyngeal phase 09/17/2021   Cervicalgia 08/05/2021   Spondylosis without myelopathy or radiculopathy, lumbosacral region 06/24/2021   Lumbar central spinal stenosis, w/o neurogenic claudication (L4-5)  06/03/2021   Lumbar lateral recess stenosis (Bilateral: L2-3, L4-5) (Right: L3-4) 06/03/2021   Lumbosacral foraminal stenosis (Bilateral: L2-3, L3-4) (Left: L5-S1) 06/03/2021   Lumbar nerve root impingement (Right: L3 at L2-3 & L3-4) 06/03/2021   Ligamentum flavum hypertrophy (L3-4, L4-5) 06/03/2021   Abnormal MRI, lumbar spine (04/23/2021) 04/24/2021   Long term prescription benzodiazepine use (alprazolam) (Xanax) 03/24/2021   Grade 1 Retrolisthesis of L2/L3 (5 mm) and L3/L4 (3 mm) 03/24/2021   Levoscoliosis of lumbar spine (L3-4 apex) 03/24/2021   DDD (degenerative disc disease), lumbosacral 03/24/2021   Lumbosacral facet arthropathy (Left: L3-4, L4-5, and L5-S1) 03/24/2021   Tricompartment osteoarthritis of knee (Left) 03/24/2021   Baker cyst (Left) 03/24/2021   Chronic low back pain (1ry area of Pain) (Bilateral) (R>L) w/o sciatica 03/24/2021   Lumbar facet syndrome (Bilateral) 03/24/2021   Chronic lower extremity pain (2ry area of Pain) (Bilateral) (L>R) 03/24/2021   Lumbosacral radiculitis/sensory radiculopathy at L2 (Bilateral) 03/24/2021   Lumbosacral radiculitis/sensory radiculopathy at L3 (Bilateral) 03/24/2021   Chronic pain syndrome 03/23/2021   Pharmacologic therapy 03/23/2021   Disorder of skeletal system 03/23/2021   Problems influencing health status 03/23/2021   PAD (peripheral artery disease) ([place]) 10/01/2020   GAD (generalized anxiety disorder) 01/13/2018   MDD (major depressive disorder) 70/96/2836   Umbilical hernia 62/94/7654   Chronic knee pain (Left) 08/11/2017   Derangement of medial meniscus,  posterior horn (Left) 08/11/2017   Vitamin D insufficiency 03/05/2016   BPH without obstruction/lower urinary tract symptoms 07/08/2015   Chronic fatigue 10/23/2014   Parkinson's disease ([place]) 09/20/2014   Major depression, recurrent, full remission ([place]) 07/26/2013   Unsteady gait 07/26/2013   Orthostatic hypotension 07/26/2013   Depression 07/26/2013   Anxiety  06/23/2013   Chronic anxiety 06/23/2013   Tremor 05/18/2013   Bilateral carotid artery stenosis 08/30/2012    ONSET DATE: 08/11/2021  REFERRING DIAG: Parkinsons Disease  THERAPY DIAG:  Difficulty in walking, not elsewhere classified  Muscle weakness (generalized)  Unsteadiness on feet  Abnormality of gait and mobility  Other abnormalities of gait and mobility  Atypical parkinsonism  Rationale for Evaluation and Treatment Rehabilitation  SUBJECTIVE:                                                                                                                                                                                            SUBJECTIVE STATEMENT: Patient reports back is 4/10, going back to see pain management next week.   Pt accompanied by: Brother visiting from [place]  PERTINENT HISTORY: Joseph Arroyo is a 32yoM who is referred to OPPT due to 2 falls in the setting of chronic parkinson's disease. Pt AMB with RW at baseline but is very physically active.    PAIN:  Are you having pain? Yes: NPRS scale: 4/10 Pain location: low back Pain description: ache/soreness Aggravating factors: prolonged standing, transfers, walking Relieving factors: rest and medications   PATIENT GOALS  to walk better and be as active as possible. Also to get in/out of cars better.   OBJECTIVE:   Therex:  *Hot pack with a pillow placed on back of chair to decrease pain in the patient's lower back region during all seated rest breaks between tests*  Seated Hip march 2.5# x 12 reps each LE Seated Knee ext 2.5# x 12 reps each LE (VC for full ROM)  Seated hip flex up/over 1/2 spike ball x 12 reps using 2.5# AW (VC to perform slowly)  -STS from chair hands on knees  with VC for TrA contraction x10 Core bracing - TrA contraction in sitting x 10 Gluteal bracing- hold 5 sec x 10   NMR: (all activities performed at dry erase board with CGA and use of gait belt)  Standing on  incline - with abdominal contraction-hold 30 sec x 3 sets (initial difficulty - losing balance posteriorly)  Standing on incline - arranging magnet letters on dry erase board in order of color- dynamic diagonal UE reaching x several min Standing on airex pad - arranging magnet letters on  dry erase board in order of color- dynamic diagonal UE reaching x several min Attempt to stand on 1/2 foam (curve side up) - Max difficulty maintaining balance > 10 sec x mult trials Standing with heels on 1/2 foam and toes on firm surface (decline) while arranging magnet letters on dry erase board   PATIENT EDUCATION: Education details: Pt educated throughout session about proper posture and technique with exercises. Improved exercise technique, movement at target joints, use of target muscles after min to mod verbal, visual, tactile cues.  Person educated: Patient Education method: Explanation, Demonstration, Tactile cues, and Verbal cues Education comprehension: verbalized understanding, returned demonstration, verbal cues required, tactile cues required, and needs further education   HOME EXERCISE PROGRAM:  Access Code: [email or web address] URL: [email or web address] Date: 12/09/2021 Prepared by: Amalia Hailey  Exercises - Seated Alternating Side Stretch with Arm Overhead  - 1 x daily - 7 x weekly - 2 sets - 10 reps - Seated Thoracic Extension Arms Overhead  - 1 x daily - 7 x weekly - 2 sets - 10 reps  GOALS: Goals reviewed with patient? Yes  SHORT TERM GOALS: Target date: 10/02/2021  Pt will be independent with initial HEP in order to improve strength and balance in order to decrease fall risk and improve function at home and work.  Baseline: 08/21/2021-No formal HEP in place Goal status: MET   LONG TERM GOALS: Target date: 02/05/2022  Pt will improve BERG by at least 3 points in order to demonstrate clinically significant improvement in balance.   Baseline: 08/21/2021= 48/56 8/24:47/56;  11/13/2021= 47/56, 01/06/2022 = Unable to test due to time constraint- will test next session and update goal; 01/08/2022= 51/56- will keep goal active to ensure consistency. Goal status: ongoing  2.  Pt will be independent with final HEP in order to improve strength and balance in order to decrease fall risk and improve function at home and work.  Baseline: No HEP in place, 8/24: completing current HEP but still progressing; 11/13/2021- Patient continues to verbalize and demonstrate understanding of HEP for stretching/strengthening and mobility but still progressing and altering program as appropriate; 11/7: Continues follow current HEP but still progressing HEP as patient progresses  Goal status: IN PROGRESS  3.  Pt will decrease 5TSTS by at least 4 seconds in order to demonstrate clinically significant improvement in LE strength. Baseline: 08/21/2021= 19.01 sec without UE support 8/24:10.56 sec Goal status: MET  4.  Pt will decrease TUG to below 14 seconds/decrease in order to demonstrate decreased fall risk. Baseline:  08/21/2021=15.67 with RW 8/24: 12.41 Goal status: MET  5.  Pt will improve FOTO to target score of 56 to display perceived improvements in ability to complete ADL's.  Baseline: 08/21/2021=49 8/24: 45.6; 11/13/2021=46, 01/06/2022 = 45.6 Goal status: IN PROGRESS  6.  Pt will increase 10MWT by at least 0.15 m/s in order to demonstrate clinically significant improvement in community ambulation.   Baseline: 08/22/2021=0.94 m/s 8/24:1.54ms Goal status: MET  7 Pt will improve miniBESTest by at least 4 points to demonstrate clinically significant improvement in reduced falls with parkinsonian related deficits.  Baseline: 19/28; 11/13/2021- Unable to test due to time constraint- will test next session and update goal. 9/19: 18/28; 01/06/2022 - 23/28 Goal Status: MET  8. Pt will increase 6MWT by at least [id number] order to demonstrate clinically significant improvement in  cardiopulmonary endurance and community ambulation  Baseline: 11/13/2021= 650 feet with walker; 01/06/2022 = 1160 feet with walker.  Goal Status: MET  ASSESSMENT:  CLINICAL IMPRESSION: Pt continues to present with excellent motivation for today's session and denied any worsening of low back pain. Patient able to focus on continued LE strengthening and standing balance with some very brief rest breaks. He continues to loose balance mostly posteriorly yet did adapt well today with incline and later airex/pad. He exhibited most challenge with standing on 1/2 foam - unable to maintain balance with ankle/hip strategy and will continue to benefit from progressive balance activities while modifying for chronic low back pain.  Pt will continue to benefit from skilled therapy to address remaining deficits in order to improve overall quality of life and return to PLOF.      OBJECTIVE IMPAIRMENTS Abnormal gait, decreased activity tolerance, decreased balance, decreased coordination, decreased endurance, decreased knowledge of use of DME, decreased mobility, difficulty walking, decreased ROM, decreased strength, hypomobility, and impaired flexibility.   ACTIVITY LIMITATIONS carrying, lifting, bending, standing, squatting, sleeping, stairs, transfers, continence, bathing, toileting, and dressing  PARTICIPATION LIMITATIONS: cleaning, laundry, medication management, driving, shopping, community activity, and yard work  PERSONAL FACTORS Age are also affecting patient's functional outcome.   REHAB POTENTIAL: Good  CLINICAL DECISION MAKING: Stable/uncomplicated  EVALUATION COMPLEXITY: Low  PLAN: PT FREQUENCY: 2x/week  PT DURATION: 12 weeks  PLANNED INTERVENTIONS: Therapeutic exercises, Therapeutic activity, Neuromuscular re-education, Balance training, Gait training, Patient/Family education, Joint mobilization, Stair training, Vestibular training, Canalith repositioning, DME instructions, Dry Needling,  Electrical stimulation, Spinal manipulation, and Spinal mobilization  PLAN FOR NEXT SESSION: balance  6:44 AM, 01/20/22 Ollen Bowl, PT Physical Therapist - [place] [phone]      01/20/22, 6:44 AM

## 2022-01-19 ENCOUNTER — Ambulatory Visit: Payer: Medicare HMO

## 2022-01-19 DIAGNOSIS — R269 Unspecified abnormalities of gait and mobility: Secondary | ICD-10-CM

## 2022-01-19 DIAGNOSIS — R2681 Unsteadiness on feet: Secondary | ICD-10-CM

## 2022-01-19 DIAGNOSIS — R278 Other lack of coordination: Secondary | ICD-10-CM | POA: Diagnosis not present

## 2022-01-19 DIAGNOSIS — R262 Difficulty in walking, not elsewhere classified: Secondary | ICD-10-CM | POA: Diagnosis not present

## 2022-01-19 DIAGNOSIS — R2689 Other abnormalities of gait and mobility: Secondary | ICD-10-CM | POA: Diagnosis not present

## 2022-01-19 DIAGNOSIS — M6281 Muscle weakness (generalized): Secondary | ICD-10-CM | POA: Diagnosis not present

## 2022-01-19 DIAGNOSIS — G20C Parkinsonism, unspecified: Secondary | ICD-10-CM

## 2022-01-20 ENCOUNTER — Encounter: Payer: Medicare HMO | Admitting: Occupational Therapy

## 2022-01-20 ENCOUNTER — Encounter: Payer: Medicare HMO | Admitting: Speech Pathology

## 2022-01-20 ENCOUNTER — Ambulatory Visit: Payer: Medicare HMO

## 2022-01-20 NOTE — Therapy (Signed)
[facility] OUTPATIENT PHYSICAL THERAPY NEURO TREATMENT Patient Name: Joseph Arroyo MRN: [id number] DOB:November 25, 1940, 81 y.o., male Today's Date: 01/21/2022  PCP: Joseph Hauser, DO REFERRING PROVIDER: Lavena Bullion, NP   PT End of Session - 01/21/22 1139     Visit Number 34    Number of Visits 40    Date for PT Re-Evaluation 03/17/22    Authorization Type [employer] Medicare    Authorization Time Period 11/13/2021-02/05/2022    Progress Note Due on Visit 40    PT Start Time 1140    PT Stop Time 1222    PT Time Calculation (min) 42 min    Equipment Utilized During Treatment Gait belt    Activity Tolerance Patient tolerated treatment well;No increased pain    Behavior During Therapy Joseph Arroyo for tasks assessed/performed              Past Medical History:  Diagnosis Date   Allergic rhinitis due to pollen 11/21/2007   Brachial neuritis or radiculitis NOS    Cervicalgia    Concussion    age 54 - s/p accident   Costal chondritis    Depression    Essential and other specified forms of tremor    GERD (gastroesophageal reflux disease)    in past   Lumbago    Occlusion and stenosis of carotid artery without mention of cerebral infarction    Seizures [facility])    age 72 - after concussion   Past Surgical History:  Procedure Laterality Date   CATARACT EXTRACTION Right 07/18/14   CATARACT EXTRACTION W/PHACO Left 08/01/2014   Procedure: CATARACT EXTRACTION PHACO AND INTRAOCULAR LENS PLACEMENT ([place]);  Surgeon: Joseph Koyanagi, MD;  Location: [place];  Service: Ophthalmology;  Laterality: Left;  IVA TOPICAL   COLONOSCOPY  2008   OTHER SURGICAL HISTORY  2002   ear surgery   STAPEDES SURGERY Right 2002   Patient Active Problem List   Diagnosis Date Noted   Dysphagia, pharyngeal phase 09/17/2021   Cervicalgia 08/05/2021   Spondylosis without myelopathy or radiculopathy, lumbosacral region 06/24/2021   Lumbar central spinal stenosis, w/o neurogenic claudication (L4-5)  06/03/2021   Lumbar lateral recess stenosis (Bilateral: L2-3, L4-5) (Right: L3-4) 06/03/2021   Lumbosacral foraminal stenosis (Bilateral: L2-3, L3-4) (Left: L5-S1) 06/03/2021   Lumbar nerve root impingement (Right: L3 at L2-3 & L3-4) 06/03/2021   Ligamentum flavum hypertrophy (L3-4, L4-5) 06/03/2021   Abnormal MRI, lumbar spine (04/23/2021) 04/24/2021   Long term prescription benzodiazepine use (alprazolam) (Xanax) 03/24/2021   Grade 1 Retrolisthesis of L2/L3 (5 mm) and L3/L4 (3 mm) 03/24/2021   Levoscoliosis of lumbar spine (L3-4 apex) 03/24/2021   DDD (degenerative disc disease), lumbosacral 03/24/2021   Lumbosacral facet arthropathy (Left: L3-4, L4-5, and L5-S1) 03/24/2021   Tricompartment osteoarthritis of knee (Left) 03/24/2021   Baker cyst (Left) 03/24/2021   Chronic low back pain (1ry area of Pain) (Bilateral) (R>L) w/o sciatica 03/24/2021   Lumbar facet syndrome (Bilateral) 03/24/2021   Chronic lower extremity pain (2ry area of Pain) (Bilateral) (L>R) 03/24/2021   Lumbosacral radiculitis/sensory radiculopathy at L2 (Bilateral) 03/24/2021   Lumbosacral radiculitis/sensory radiculopathy at L3 (Bilateral) 03/24/2021   Chronic pain syndrome 03/23/2021   Pharmacologic therapy 03/23/2021   Disorder of skeletal system 03/23/2021   Problems influencing health status 03/23/2021   PAD (peripheral artery disease) ([place]) 10/01/2020   GAD (generalized anxiety disorder) 01/13/2018   MDD (major depressive disorder) 05/21/2246   Umbilical hernia 25/00/3704   Chronic knee pain (Left) 08/11/2017   Derangement of medial  meniscus, posterior horn (Left) 08/11/2017   Vitamin D insufficiency 03/05/2016   BPH without obstruction/lower urinary tract symptoms 07/08/2015   Chronic fatigue 10/23/2014   Parkinson's disease ([place]) 09/20/2014   Major depression, recurrent, full remission ([place]) 07/26/2013   Unsteady gait 07/26/2013   Orthostatic hypotension 07/26/2013   Depression 07/26/2013   Anxiety  06/23/2013   Chronic anxiety 06/23/2013   Tremor 05/18/2013   Bilateral carotid artery stenosis 08/30/2012    ONSET DATE: 08/11/2021  REFERRING DIAG: Parkinsons Disease  THERAPY DIAG:  Difficulty in walking, not elsewhere classified  Muscle weakness (generalized)  Unsteadiness on feet  Abnormality of gait and mobility  Other abnormalities of gait and mobility  Atypical parkinsonism  Other lack of coordination  Rationale for Evaluation and Treatment Rehabilitation  SUBJECTIVE:   Patient reports doing okay- today- reports about a 4/10                                                                                                                                                                                            SUBJECTIVE STATEMENT:   Pt accompanied by: Brother visiting from [place]  PERTINENT HISTORY: Joseph Arroyo is a 53yoM who is referred to OPPT due to 2 falls in the setting of chronic parkinson's disease. Pt AMB with RW at baseline but is very physically active.    PAIN:  Are you having pain? Yes: NPRS scale: 4/10 Pain location: low back Pain description: ache/soreness Aggravating factors: prolonged standing, transfers, walking Relieving factors: rest and medications   PATIENT GOALS  to walk better and be as active as possible. Also to get in/out of cars better.   OBJECTIVE:   Therex:  *Hot pack with a pillow placed on mat table to decrease pain in the patient's lower back region during all supine activities.   Core stabilization:  Instruction in TrA contraction in supine (hooklye) - Hold 5 sec with VC to count out loud for proper breathing.  TrA contraction with Slight hip march x 10 reps each TrA contraction with hip add (ball squeeze) x 10 reps each TrA contraction with hip ER (BTB) (hooklye) x 10 reps each TrA contraction with heel slide x 10 reps each LE TrA contraction with Deadbug (alt opp UE/LE) x 10 reps each *no pain reported -  mini rest break between each sets      PATIENT EDUCATION: Education details: Pt educated throughout session about proper posture and technique with exercises. Improved exercise technique, movement at target joints, use of target muscles after min to mod verbal, visual, tactile cues.  Person educated: Patient Education method: Explanation, Demonstration, Tactile cues, and  Verbal cues Education comprehension: verbalized understanding, returned demonstration, verbal cues required, tactile cues required, and needs further education   HOME EXERCISE PROGRAM:  Access Code: [email or web address] URL: [email or web address] Date: 12/09/2021 Prepared by: Amalia Hailey  Exercises - Seated Alternating Side Stretch with Arm Overhead  - 1 x daily - 7 x weekly - 2 sets - 10 reps - Seated Thoracic Extension Arms Overhead  - 1 x daily - 7 x weekly - 2 sets - 10 reps  GOALS: Goals reviewed with patient? Yes  SHORT TERM GOALS: Target date: 10/02/2021  Pt will be independent with initial HEP in order to improve strength and balance in order to decrease fall risk and improve function at home and work.  Baseline: 08/21/2021-No formal HEP in place Goal status: MET   LONG TERM GOALS: Target date: 02/05/2022  Pt will improve BERG by at least 3 points in order to demonstrate clinically significant improvement in balance.   Baseline: 08/21/2021= 48/56 8/24:47/56; 11/13/2021= 47/56, 01/06/2022 = Unable to test due to time constraint- will test next session and update goal; 01/08/2022= 51/56- will keep goal active to ensure consistency. Goal status: ongoing  2.  Pt will be independent with final HEP in order to improve strength and balance in order to decrease fall risk and improve function at home and work.  Baseline: No HEP in place, 8/24: completing current HEP but still progressing; 11/13/2021- Patient continues to verbalize and demonstrate understanding of HEP for stretching/strengthening and mobility but  still progressing and altering program as appropriate; 11/7: Continues follow current HEP but still progressing HEP as patient progresses  Goal status: IN PROGRESS  3.  Pt will decrease 5TSTS by at least 4 seconds in order to demonstrate clinically significant improvement in LE strength. Baseline: 08/21/2021= 19.01 sec without UE support 8/24:10.56 sec Goal status: MET  4.  Pt will decrease TUG to below 14 seconds/decrease in order to demonstrate decreased fall risk. Baseline:  08/21/2021=15.67 with RW 8/24: 12.41 Goal status: MET  5.  Pt will improve FOTO to target score of 56 to display perceived improvements in ability to complete ADL's.  Baseline: 08/21/2021=49 8/24: 45.6; 11/13/2021=46, 01/06/2022 = 45.6 Goal status: IN PROGRESS  6.  Pt will increase 10MWT by at least 0.15 m/s in order to demonstrate clinically significant improvement in community ambulation.   Baseline: 08/22/2021=0.94 m/s 8/24:1.71ms Goal status: MET  7 Pt will improve miniBESTest by at least 4 points to demonstrate clinically significant improvement in reduced falls with parkinsonian related deficits.  Baseline: 19/28; 11/13/2021- Unable to test due to time constraint- will test next session and update goal. 9/19: 18/28; 01/06/2022 - 23/28 Goal Status: MET  8. Pt will increase 6MWT by at least [id number] order to demonstrate clinically significant improvement in cardiopulmonary endurance and community ambulation  Baseline: 11/13/2021= 650 feet with walker; 01/06/2022 = 1160 feet with walker.  Goal Status: MET   ASSESSMENT:  CLINICAL IMPRESSION: Treatment focused on learning some basic core activities that patient could perform without spiking up his back pain. He performed well with VC and visual demonstration - performing all core activities well- able to properly engage transverse abdominals.  Pt will continue to benefit from skilled therapy to address remaining deficits in order to improve overall quality of life  and return to PLOF.      OBJECTIVE IMPAIRMENTS Abnormal gait, decreased activity tolerance, decreased balance, decreased coordination, decreased endurance, decreased knowledge of use of DME, decreased mobility, difficulty walking, decreased ROM, decreased strength, hypomobility, and  impaired flexibility.   ACTIVITY LIMITATIONS carrying, lifting, bending, standing, squatting, sleeping, stairs, transfers, continence, bathing, toileting, and dressing  PARTICIPATION LIMITATIONS: cleaning, laundry, medication management, driving, shopping, community activity, and yard work  PERSONAL FACTORS Age are also affecting patient's functional outcome.   REHAB POTENTIAL: Good  CLINICAL DECISION MAKING: Stable/uncomplicated  EVALUATION COMPLEXITY: Low  PLAN: PT FREQUENCY: 2x/week  PT DURATION: 12 weeks  PLANNED INTERVENTIONS: Therapeutic exercises, Therapeutic activity, Neuromuscular re-education, Balance training, Gait training, Patient/Family education, Joint mobilization, Stair training, Vestibular training, Canalith repositioning, DME instructions, Dry Needling, Electrical stimulation, Spinal manipulation, and Spinal mobilization  PLAN FOR NEXT SESSION: balance  12:52 PM, 01/21/22 Ollen Bowl, PT Physical Therapist - [place] [phone]      01/21/22, 12:52 PM

## 2022-01-21 ENCOUNTER — Ambulatory Visit: Payer: Medicare HMO

## 2022-01-21 DIAGNOSIS — R278 Other lack of coordination: Secondary | ICD-10-CM

## 2022-01-21 DIAGNOSIS — R262 Difficulty in walking, not elsewhere classified: Secondary | ICD-10-CM | POA: Diagnosis not present

## 2022-01-21 DIAGNOSIS — R2681 Unsteadiness on feet: Secondary | ICD-10-CM | POA: Diagnosis not present

## 2022-01-21 DIAGNOSIS — G20C Parkinsonism, unspecified: Secondary | ICD-10-CM | POA: Diagnosis not present

## 2022-01-21 DIAGNOSIS — M6281 Muscle weakness (generalized): Secondary | ICD-10-CM

## 2022-01-21 DIAGNOSIS — R269 Unspecified abnormalities of gait and mobility: Secondary | ICD-10-CM

## 2022-01-21 DIAGNOSIS — R2689 Other abnormalities of gait and mobility: Secondary | ICD-10-CM

## 2022-01-24 NOTE — Progress Notes (Signed)
PROVIDER NOTE: Interpretation of information contained herein should be left to medically-trained personnel. Specific patient instructions are provided elsewhere under "Patient Instructions" section of medical record. This document was created in part using STT-dictation technology, any transcriptional errors that may result from this process are unintentional.  Patient: Joseph Arroyo Type: Established DOB: [birth year] MRN: [id number] PCP: Olin Hauser, DO  Service: Procedure DOS: 01/27/2022 Setting: Ambulatory Location: Ambulatory outpatient facility Delivery: Face-to-face Provider: Gaspar Cola, MD Specialty: Interventional Pain Management Specialty designation: 09 Location: Outpatient facility Ref. Prov.: No ref. provider found    Primary Reason for Visit: Interventional Pain Management Treatment. CC: Back Pain (Lumbar right is worse )   Procedure:           Type: Lumbar epidural steroid injection (LESI) (interlaminar) #2    Laterality: Right   Level:  L1-2 Level.  Imaging: Fluoroscopic guidance         Anesthesia: Local anesthesia (1-2% Lidocaine) Anxiolysis: IV Versed 1.5 mg Sedation: No Sedation                       DOS: 01/27/2022  Performed by: Gaspar Cola, MD  Purpose: Diagnostic/Therapeutic Indications: Lumbar radicular pain of intraspinal etiology of more than 4 weeks that has failed to respond to conservative therapy and is severe enough to impact quality of life or function. 1. Chronic low back pain (1ry area of Pain) (Bilateral) (R>L) w/o sciatica   2. Chronic lower extremity pain (2ry area of Pain) (Bilateral) (L>R)   3. Grade 1 Retrolisthesis of L2/L3 (5 mm) and L3/L4 (3 mm)   4. Levoscoliosis of lumbar spine (L3-4 apex)   5. DDD (degenerative disc disease), lumbosacral   6. Lumbosacral radiculitis/sensory radiculopathy at L2 (Bilateral)   7. Abnormal MRI, lumbar spine (04/23/2021)   8. Parkinson's disease with dyskinesia,  unspecified whether manifestations fluctuate    NAS-11 Pain score:   Pre-procedure: 6 /10   Post-procedure: 4 /10      Position / Prep / Materials:  Position: Prone w/ head of the table raised (slight reverse trendelenburg) to facilitate breathing.  Prep solution: DuraPrep (Iodine Povacrylex [0.7% available iodine] and Isopropyl Alcohol, 74% w/w) Prep Area: Entire Posterior Lumbar Region from lower scapular tip down to mid buttocks area and from flank to flank. Materials:  Tray: Epidural tray Needle(s):  Type: Epidural needle (Tuohy) Gauge (G):  17 Length: Regular (3.5-in) Qty: 1  Pre-op H&P Assessment:  Mr. Ansell is a 81 y.o. (year old), male patient, seen today for interventional treatment. He  has a past surgical history that includes Other surgical history (2002); Colonoscopy (2008); Stapedes surgery (Right, 2002); Cataract extraction (Right, 07/18/14); and Cataract extraction w/PHACO (Left, 08/01/2014). Mr. Lor has a current medication list which includes the following prescription(s): acetaminophen, alprazolam, b complex vitamins, baclofen, capsicum-garlic, carbidopa-levodopa, clonidine, gabapentin, hawthorn, l-methylfolate, lutein, paroxetine, paroxetine, pramipexole, psyllium, and saw palmetto (serenoa repens), and the following Facility-Administered Medications: lactated ringers. His primarily concern today is the Back Pain (Lumbar right is worse )  Initial Vital Signs:  Pulse/HCG Rate: (!) 53ECG Heart Rate: 65 Temp: (!) 97.3 F (36.3 C) Resp: 16 BP: 108/72 SpO2: 99 %  BMI: Estimated body mass index is 19.58 kg/m as calculated from the following:   Height as of this encounter: '5\' 7"'$  (1.702 m).   Weight as of this encounter: 125 lb (56.7 kg).  Risk Assessment: Allergies: Reviewed. He is allergic to prednisone and wheat bran.  Allergy Precautions: None required  Coagulopathies: Reviewed. None identified.  Blood-thinner therapy: None at this time Active  Infection(s): Reviewed. None identified. Mr. Charlot is afebrile  Site Confirmation: Mr. Andujo was asked to confirm the procedure and laterality before marking the site Procedure checklist: Completed Consent: Before the procedure and under the influence of no sedative(s), amnesic(s), or anxiolytics, the patient was informed of the treatment options, risks and possible complications. To fulfill our ethical and legal obligations, as recommended by the American Medical Association's Code of Ethics, I have informed the patient of my clinical impression; the nature and purpose of the treatment or procedure; the risks, benefits, and possible complications of the intervention; the alternatives, including doing nothing; the risk(s) and benefit(s) of the alternative treatment(s) or procedure(s); and the risk(s) and benefit(s) of doing nothing. The patient was provided information about the general risks and possible complications associated with the procedure. These may include, but are not limited to: failure to achieve desired goals, infection, bleeding, organ or nerve damage, allergic reactions, paralysis, and death. In addition, the patient was informed of those risks and complications associated to Spine-related procedures, such as failure to decrease pain; infection (i.e.: Meningitis, epidural or intraspinal abscess); bleeding (i.e.: epidural hematoma, subarachnoid hemorrhage, or any other type of intraspinal or peri-dural bleeding); organ or nerve damage (i.e.: Any type of peripheral nerve, nerve root, or spinal cord injury) with subsequent damage to sensory, motor, and/or autonomic systems, resulting in permanent pain, numbness, and/or weakness of one or several areas of the body; allergic reactions; (i.e.: anaphylactic reaction); and/or death. Furthermore, the patient was informed of those risks and complications associated with the medications. These include, but are not limited to: allergic reactions  (i.e.: anaphylactic or anaphylactoid reaction(s)); adrenal axis suppression; blood sugar elevation that in diabetics may result in ketoacidosis or comma; water retention that in patients with history of congestive heart failure may result in shortness of breath, pulmonary edema, and decompensation with resultant heart failure; weight gain; swelling or edema; medication-induced neural toxicity; particulate matter embolism and blood vessel occlusion with resultant organ, and/or nervous system infarction; and/or aseptic necrosis of one or more joints. Finally, the patient was informed that Medicine is not an exact science; therefore, there is also the possibility of unforeseen or unpredictable risks and/or possible complications that may result in a catastrophic outcome. The patient indicated having understood very clearly. We have given the patient no guarantees and we have made no promises. Enough time was given to the patient to ask questions, all of which were answered to the patient's satisfaction. Mr. Schexnayder has indicated that he wanted to continue with the procedure. Attestation: I, the ordering provider, attest that I have discussed with the patient the benefits, risks, side-effects, alternatives, likelihood of achieving goals, and potential problems during recovery for the procedure that I have provided informed consent. Date  Time: 01/27/2022  9:39 AM  Pre-Procedure Preparation:  Monitoring: As per clinic protocol. Respiration, ETCO2, SpO2, BP, heart rate and rhythm monitor placed and checked for adequate function Safety Precautions: Patient was assessed for positional comfort and pressure points before starting the procedure. Time-out: I initiated and conducted the "Time-out" before starting the procedure, as per protocol. The patient was asked to participate by confirming the accuracy of the "Time Out" information. Verification of the correct person, site, and procedure were performed and  confirmed by me, the nursing staff, and the patient. "Time-out" conducted as per Joint Commission's Universal Protocol (UP.01.01.01). Time: 1035  Description/Narrative of Procedure:  Target: Epidural space via interlaminar opening, initially targeting the lower laminar border of the superior vertebral body. Region: Lumbar Approach: Percutaneous paravertebral  Rationale (medical necessity): procedure needed and proper for the diagnosis and/or treatment of the patient's medical symptoms and needs. Procedural Technique Safety Precautions: Aspiration looking for blood return was conducted prior to all injections. At no point did we inject any substances, as a needle was being advanced. No attempts were made at seeking any paresthesias. Safe injection practices and needle disposal techniques used. Medications properly checked for expiration dates. SDV (single dose vial) medications used. Description of the Procedure: Protocol guidelines were followed. The procedure needle was introduced through the skin, ipsilateral to the reported pain, and advanced to the target area. Bone was contacted and the needle walked caudad, until the lamina was cleared. The epidural space was identified using "loss-of-resistance technique" with 2-3 ml of PF-NaCl (0.9% NSS), in a 5cc LOR glass syringe.  Vitals:   01/27/22 1032 01/27/22 1037 01/27/22 1040 01/27/22 1049  BP: 122/88 123/87  104/74  Pulse:    68  Resp: '18 16 18 18  '$ Temp:      TempSrc:      SpO2: 98% 97% 97% 97%  Weight:      Height:        Start Time: 1035 hrs. End Time: 1039 hrs.  Imaging Guidance (Spinal):          Type of Imaging Technique: Fluoroscopy Guidance (Spinal) Indication(s): Assistance in needle guidance and placement for procedures requiring needle placement in or near specific anatomical locations not easily accessible without such assistance. Exposure Time: Please see nurses notes. Contrast: Before injecting any contrast, we  confirmed that the patient did not have an allergy to iodine, shellfish, or radiological contrast. Once satisfactory needle placement was completed at the desired level, radiological contrast was injected. Contrast injected under live fluoroscopy. No contrast complications. See chart for type and volume of contrast used. Fluoroscopic Guidance: I was personally present during the use of fluoroscopy. "Tunnel Vision Technique" used to obtain the best possible view of the target area. Parallax error corrected before commencing the procedure. "Direction-depth-direction" technique used to introduce the needle under continuous pulsed fluoroscopy. Once target was reached, antero-posterior, oblique, and lateral fluoroscopic projection used confirm needle placement in all planes. Images permanently stored in EMR. Interpretation: I personally interpreted the imaging intraoperatively. Adequate needle placement confirmed in multiple planes. Appropriate spread of contrast into desired area was observed. No evidence of afferent or efferent intravascular uptake. No intrathecal or subarachnoid spread observed. Permanent images saved into the patient's record.  Antibiotic Prophylaxis:   Anti-infectives (From admission, onward)    None      Indication(s): None identified  Post-operative Assessment:  Post-procedure Vital Signs:  Pulse/HCG Rate: 6863 Temp: (!) 97.3 F (36.3 C) Resp: 18 BP: 104/74 SpO2: 97 %  EBL: None  Complications: No immediate post-treatment complications observed by team, or reported by patient.  Note: The patient tolerated the entire procedure well. A repeat set of vitals were taken after the procedure and the patient was kept under observation following institutional policy, for this type of procedure. Post-procedural neurological assessment was performed, showing return to baseline, prior to discharge. The patient was provided with post-procedure discharge instructions, including a  section on how to identify potential problems. Should any problems arise concerning this procedure, the patient was given instructions to immediately contact us, at any time, without hesitation. In any case, we plan to contact the patient by telephone  for a follow-up status report regarding this interventional procedure.  Comments:  No additional relevant information.  Plan of Care  Orders:  Orders Placed This Encounter  Procedures   Lumbar Epidural Injection    Scheduling Instructions:     Procedure: Interlaminar LESI L1-2     Laterality: Right     Sedation: Patient's choice     Timeframe: Today    Order Specific Question:   Where will this procedure be performed?    Answer:   [facility]   DG [facility] C-ARM 1-60 MIN NO REPORT    Intraoperative interpretation by procedural physician at [place].    Standing Status:   Standing    Number of Occurrences:   1    Order Specific Question:   Reason for exam:    Answer:   Assistance in needle guidance and placement for procedures requiring needle placement in or near specific anatomical locations not easily accessible without such assistance.   Informed Consent Details: Physician/Practitioner Attestation; Transcribe to consent form and obtain patient signature    Note: Always confirm laterality of pain with Mr. Mazer, before procedure. Transcribe to consent form and obtain patient signature.    Order Specific Question:   Physician/Practitioner attestation of informed consent for procedure/surgical case    Answer:   I, the physician/practitioner, attest that I have discussed with the patient the benefits, risks, side effects, alternatives, likelihood of achieving goals and potential problems during recovery for the procedure that I have provided informed consent.    Order Specific Question:   Procedure    Answer:   Lumbar epidural steroid injection under fluoroscopic guidance    Order Specific Question:    Physician/Practitioner performing the procedure    Answer:   Shaelynn Dragos A. Dossie Arbour, MD    Order Specific Question:   Indication/Reason    Answer:   Low back and/or lower extremity pain secondary to lumbar radiculitis   Provide equipment / supplies at bedside    "Epidural Tray" (Disposable  single use) Skin infiltration needle: Regular - 1.5-in, 25-G, (x1) Block needle size: Regular Catheter: NOT required    Standing Status:   Standing    Number of Occurrences:   1    Order Specific Question:   Specify    Answer:   Epidural Tray   Chronic Opioid Analgesic:  None MME/day: 0 mg/day   Medications ordered for procedure: Meds ordered this encounter  Medications   iohexol (OMNIPAQUE) 180 MG/ML injection 10 mL    Must be Myelogram-compatible. If not available, you may substitute with a water-soluble, non-ionic, hypoallergenic, myelogram-compatible radiological contrast medium.   lidocaine (XYLOCAINE) 2 % (with pres) injection 400 mg   pentafluoroprop-tetrafluoroeth (GEBAUERS) aerosol   lactated ringers infusion   midazolam (VERSED) injection 0.5-2 mg    Make sure Flumazenil is available in the pyxis when using this medication. If oversedation occurs, administer 0.2 mg IV over 15 sec. If after 45 sec no response, administer 0.2 mg again over 1 min; may repeat at 1 min intervals; not to exceed 4 doses (1 mg)   sodium chloride flush (NS) 0.9 % injection 2 mL   ropivacaine (PF) 2 mg/mL (0.2%) (NAROPIN) injection 2 mL   triamcinolone acetonide (KENALOG-40) injection 40 mg   Medications administered: We administered iohexol, lidocaine, pentafluoroprop-tetrafluoroeth, lactated ringers, midazolam, sodium chloride flush, ropivacaine (PF) 2 mg/mL (0.2%), and triamcinolone acetonide.  See the medical record for exact dosing, route, and time of administration.  Follow-up plan:  Return in about 2 weeks (around 02/10/2022) for Proc-day (T,Th), (F2F), (PPE).       Interventional Therapies  Risk   Complexity Considerations:   Estimated body mass index is 20.98 kg/m as calculated from the following:   Height as of this encounter: '5\' 8"'$  (1.727 m).   Weight as of this encounter: 138 lb (62.6 kg).    Levoscoliosis (L3-4 apex)  Parkinsons (W-Chair w/ dyskinesia)  SENSITIVITY: Oral Prednisone (Hyperactivity)   Planned  Pending:   Diagnostic right L1-2 LESI #2 (01/27/2022)    Under consideration:   Diagnostic bilateral L2-3 & L3-4 lumbar facet (L1-4) MBB #2  Diagnostic x-rays of the cervical spine for further evaluation of his cervicalgia.   Completed:   Diagnostic bilateral L2-3 & L3-4 Facet Blk (L1-4 MBB) x1 (75/75/75 x2 weeks)  Diagnostic bilateral lumbar facet (L2-S1) MBB x1 (07/08/2021) (50/50/50/<50)  Diagnostic right L1-2 LESI x1 (08/26/2021) (100/100/50/50)  Diagnostic right L2-3 LESI x1 (06/03/2021) (100/100/45/45)  Referral to physical therapy for LB PT as well as a back brace eval. (04/28/2021) (water aerobics)    Therapeutic  Palliative (PRN) options:   None established    Recent Visits Date Type Provider Dept  01/15/22 Office Visit Milinda Pointer, MD [facility]  12/23/21 Procedure visit Milinda Pointer, MD [facility]  12/10/21 Office Visit Milinda Pointer, MD [facility]  Showing recent visits within past 90 days and meeting all other requirements Today's Visits Date Type Provider Dept  01/27/22 Procedure visit Milinda Pointer, MD [facility]  Showing today's visits and meeting all other requirements Future Appointments Date Type Provider Dept  02/10/22 Appointment Milinda Pointer, MD [facility]  Showing future appointments within next 90 days and meeting all other requirements  Disposition: Discharge home  Discharge (Date  Time): 01/27/2022; 1051 hrs.   Primary Care Physician: Olin Hauser, DO Location: [facility] Note by: Gaspar Cola,  MD Date: 01/27/2022; Time: 11:31 AM  Disclaimer:  Medicine is not an exact science. The only guarantee in medicine is that nothing is guaranteed. It is important to note that the decision to proceed with this intervention was based on the information collected from the patient. The Data and conclusions were drawn from the patient's questionnaire, the interview, and the physical examination. Because the information was provided in large part by the patient, it cannot be guaranteed that it has not been purposely or unconsciously manipulated. Every effort has been made to obtain as much relevant data as possible for this evaluation. It is important to note that the conclusions that lead to this procedure are derived in large part from the available data. Always take into account that the treatment will also be dependent on availability of resources and existing treatment guidelines, considered by other Pain Management Practitioners as being common knowledge and practice, at the time of the intervention. For Medico-Legal purposes, it is also important to point out that variation in procedural techniques and pharmacological choices are the acceptable norm. The indications, contraindications, technique, and results of the above procedure should only be interpreted and judged by a Board-Certified Interventional Pain Specialist with extensive familiarity and expertise in the same exact procedure and technique.

## 2022-01-26 ENCOUNTER — Telehealth: Payer: Self-pay | Admitting: Physical Therapy

## 2022-01-26 ENCOUNTER — Ambulatory Visit: Payer: Medicare HMO | Admitting: Physical Therapy

## 2022-01-26 NOTE — Therapy (Deleted)
[facility] OUTPATIENT PHYSICAL THERAPY NEURO TREATMENT Patient Name: Joseph Arroyo MRN: [id number] DOB:1940/05/08, 81 y.o., male Today's Date: 01/26/2022  PCP: Olin Hauser, DO REFERRING PROVIDER: Lavena Bullion, NP      Past Medical History:  Diagnosis Date   Allergic rhinitis due to pollen 11/21/2007   Brachial neuritis or radiculitis NOS    Cervicalgia    Concussion    age 20 - s/p accident   Costal chondritis    Depression    Essential and other specified forms of tremor    GERD (gastroesophageal reflux disease)    in past   Lumbago    Occlusion and stenosis of carotid artery without mention of cerebral infarction    Seizures [facility])    age 16 - after concussion   Past Surgical History:  Procedure Laterality Date   CATARACT EXTRACTION Right 07/18/14   CATARACT EXTRACTION W/PHACO Left 08/01/2014   Procedure: CATARACT EXTRACTION PHACO AND INTRAOCULAR LENS PLACEMENT ([place]);  Surgeon: Leandrew Koyanagi, MD;  Location: [facility];  Service: Ophthalmology;  Laterality: Left;  IVA TOPICAL   COLONOSCOPY  2008   OTHER SURGICAL HISTORY  2002   ear surgery   STAPEDES SURGERY Right 2002   Patient Active Problem List   Diagnosis Date Noted   Dysphagia, pharyngeal phase 09/17/2021   Cervicalgia 08/05/2021   Spondylosis without myelopathy or radiculopathy, lumbosacral region 06/24/2021   Lumbar central spinal stenosis, w/o neurogenic claudication (L4-5) 06/03/2021   Lumbar lateral recess stenosis (Bilateral: L2-3, L4-5) (Right: L3-4) 06/03/2021   Lumbosacral foraminal stenosis (Bilateral: L2-3, L3-4) (Left: L5-S1) 06/03/2021   Lumbar nerve root impingement (Right: L3 at L2-3 & L3-4) 06/03/2021   Ligamentum flavum hypertrophy (L3-4, L4-5) 06/03/2021   Abnormal MRI, lumbar spine (04/23/2021) 04/24/2021   Long term prescription benzodiazepine use (alprazolam) (Xanax) 03/24/2021   Grade 1 Retrolisthesis of L2/L3 (5 mm) and L3/L4 (3 mm) 03/24/2021   Levoscoliosis of  lumbar spine (L3-4 apex) 03/24/2021   DDD (degenerative disc disease), lumbosacral 03/24/2021   Lumbosacral facet arthropathy (Left: L3-4, L4-5, and L5-S1) 03/24/2021   Tricompartment osteoarthritis of knee (Left) 03/24/2021   Baker cyst (Left) 03/24/2021   Chronic low back pain (1ry area of Pain) (Bilateral) (R>L) w/o sciatica 03/24/2021   Lumbar facet syndrome (Bilateral) 03/24/2021   Chronic lower extremity pain (2ry area of Pain) (Bilateral) (L>R) 03/24/2021   Lumbosacral radiculitis/sensory radiculopathy at L2 (Bilateral) 03/24/2021   Lumbosacral radiculitis/sensory radiculopathy at L3 (Bilateral) 03/24/2021   Chronic pain syndrome 03/23/2021   Pharmacologic therapy 03/23/2021   Disorder of skeletal system 03/23/2021   Problems influencing health status 03/23/2021   PAD (peripheral artery disease) ([place]) 10/01/2020   GAD (generalized anxiety disorder) 01/13/2018   MDD (major depressive disorder) 03/10/3233   Umbilical hernia 57/32/2025   Chronic knee pain (Left) 08/11/2017   Derangement of medial meniscus, posterior horn (Left) 08/11/2017   Vitamin D insufficiency 03/05/2016   BPH without obstruction/lower urinary tract symptoms 07/08/2015   Chronic fatigue 10/23/2014   Parkinson's disease ([place]) 09/20/2014   Major depression, recurrent, full remission ([place]) 07/26/2013   Unsteady gait 07/26/2013   Orthostatic hypotension 07/26/2013   Depression 07/26/2013   Anxiety 06/23/2013   Chronic anxiety 06/23/2013   Tremor 05/18/2013   Bilateral carotid artery stenosis 08/30/2012    ONSET DATE: 08/11/2021  REFERRING DIAG: Parkinsons Disease  THERAPY DIAG:  No diagnosis found.  Rationale for Evaluation and Treatment Rehabilitation  SUBJECTIVE:   Patient reports doing okay- today- reports about a 4/10  SUBJECTIVE STATEMENT:   Pt accompanied by: Brother visiting from [place]  PERTINENT HISTORY: Joseph Arroyo is a 81yoM who is referred to OPPT due to 2 falls in the setting of chronic parkinson's disease. Pt AMB with RW at baseline but is very physically active.    PAIN:  Are you having pain? Yes: NPRS scale: 4/10 Pain location: low back Pain description: ache/soreness Aggravating factors: prolonged standing, transfers, walking Relieving factors: rest and medications   PATIENT GOALS  to walk better and be as active as possible. Also to get in/out of cars better.   OBJECTIVE:    Therex:  *Hot pack with a pillow placed on back of chair to decrease pain in the patient's lower back region during all seated rest breaks between tests*  Supine PWR! Activities sheet   Seated Hip march 2.5# x 12 reps each LE Seated Knee ext 2.5# x 12 reps each LE (VC for full ROM)  Seated hip flex up/over 1/2 spike ball x 12 reps using 2.5# AW (VC to perform slowly)  -STS from chair hands on knees  with VC for TrA contraction x10 Core bracing - TrA contraction in sitting x 10 Gluteal bracing- hold 5 sec x 10   NMR: (all activities performed at dry erase board with CGA and use of gait belt)  Standing on incline - with abdominal contraction-hold 30 sec x 3 sets (initial difficulty - losing balance posteriorly)  Standing on incline - arranging magnet letters on dry erase board in order of color- dynamic diagonal UE reaching x several min Standing on airex pad - arranging magnet letters on dry erase board in order of color- dynamic diagonal UE reaching x several min Attempt to stand on 1/2 foam (curve side up) - Max difficulty maintaining balance > 10 sec x mult trials Standing with heels on 1/2 foam and toes on firm surface (decline) while arranging magnet letters on dry erase board     PATIENT EDUCATION: Education details: Pt educated throughout session about proper posture and technique  with exercises. Improved exercise technique, movement at target joints, use of target muscles after min to mod verbal, visual, tactile cues.  Person educated: Patient Education method: Explanation, Demonstration, Tactile cues, and Verbal cues Education comprehension: verbalized understanding, returned demonstration, verbal cues required, tactile cues required, and needs further education   HOME EXERCISE PROGRAM:  Access Code: [email or web address] URL: [email or web address] Date: 12/09/2021 Prepared by: Amalia Hailey  Exercises - Seated Alternating Side Stretch with Arm Overhead  - 1 x daily - 7 x weekly - 2 sets - 10 reps - Seated Thoracic Extension Arms Overhead  - 1 x daily - 7 x weekly - 2 sets - 10 reps  GOALS: Goals reviewed with patient? Yes  SHORT TERM GOALS: Target date: 10/02/2021  Pt will be independent with initial HEP in order to improve strength and balance in order to decrease fall risk and improve function at home and work.  Baseline: 08/21/2021-No formal HEP in place Goal status: MET   LONG TERM GOALS: Target date: 02/05/2022  Pt will improve BERG by at least 3 points in order to demonstrate clinically significant improvement in balance.   Baseline: 08/21/2021= 48/56 8/24:47/56; 11/13/2021= 47/56, 01/06/2022 = Unable to test due to time constraint- will test next session and update goal; 01/08/2022= 51/56- will keep goal active to ensure consistency. Goal status: ongoing  2.  Pt will be independent with final HEP in order to improve strength and balance in order to decrease fall  risk and improve function at home and work.  Baseline: No HEP in place, 8/24: completing current HEP but still progressing; 11/13/2021- Patient continues to verbalize and demonstrate understanding of HEP for stretching/strengthening and mobility but still progressing and altering program as appropriate; 11/7: Continues follow current HEP but still progressing HEP as patient progresses  Goal status:  IN PROGRESS  3.  Pt will decrease 5TSTS by at least 4 seconds in order to demonstrate clinically significant improvement in LE strength. Baseline: 08/21/2021= 19.01 sec without UE support 8/24:10.56 sec Goal status: MET  4.  Pt will decrease TUG to below 14 seconds/decrease in order to demonstrate decreased fall risk. Baseline:  08/21/2021=15.67 with RW 8/24: 12.41 Goal status: MET  5.  Pt will improve FOTO to target score of 56 to display perceived improvements in ability to complete ADL's.  Baseline: 08/21/2021=49 8/24: 45.6; 11/13/2021=46, 01/06/2022 = 45.6 Goal status: IN PROGRESS  6.  Pt will increase 10MWT by at least 0.15 m/s in order to demonstrate clinically significant improvement in community ambulation.   Baseline: 08/22/2021=0.94 m/s 8/24:1.18ms Goal status: MET  7 Pt will improve miniBESTest by at least 4 points to demonstrate clinically significant improvement in reduced falls with parkinsonian related deficits.  Baseline: 19/28; 11/13/2021- Unable to test due to time constraint- will test next session and update goal. 9/19: 18/28; 01/06/2022 - 23/28 Goal Status: MET  8. Pt will increase 6MWT by at least [id number] order to demonstrate clinically significant improvement in cardiopulmonary endurance and community ambulation  Baseline: 11/13/2021= 650 feet with walker; 01/06/2022 = 1160 feet with walker.  Goal Status: MET   ASSESSMENT:  CLINICAL IMPRESSION: Treatment focused on learning some basic core activities that patient could perform without spiking up his back pain. He performed well with VC and visual demonstration - performing all core activities well- able to properly engage transverse abdominals.  Pt will continue to benefit from skilled therapy to address remaining deficits in order to improve overall quality of life and return to PLOF.      OBJECTIVE IMPAIRMENTS Abnormal gait, decreased activity tolerance, decreased balance, decreased coordination, decreased  endurance, decreased knowledge of use of DME, decreased mobility, difficulty walking, decreased ROM, decreased strength, hypomobility, and impaired flexibility.   ACTIVITY LIMITATIONS carrying, lifting, bending, standing, squatting, sleeping, stairs, transfers, continence, bathing, toileting, and dressing  PARTICIPATION LIMITATIONS: cleaning, laundry, medication management, driving, shopping, community activity, and yard work  PERSONAL FACTORS Age are also affecting patient's functional outcome.   REHAB POTENTIAL: Good  CLINICAL DECISION MAKING: Stable/uncomplicated  EVALUATION COMPLEXITY: Low  PLAN: PT FREQUENCY: 2x/week  PT DURATION: 12 weeks  PLANNED INTERVENTIONS: Therapeutic exercises, Therapeutic activity, Neuromuscular re-education, Balance training, Gait training, Patient/Family education, Joint mobilization, Stair training, Vestibular training, Canalith repositioning, DME instructions, Dry Needling, Electrical stimulation, Spinal manipulation, and Spinal mobilization  PLAN FOR NEXT SESSION: balance  10:13 AM, 01/26/22 ChrParticia Lather     01/26/22, 10:13 AM

## 2022-01-26 NOTE — Telephone Encounter (Signed)
Called and spoke with patient's caregiver regarding missed appointment on 11/27. Pt/ caregiver had times mixed up and were apologetic regarding mistake. Pt caregiver reports they will be at next scheduled appointment.   Rivka Barbara PT, DPT

## 2022-01-27 ENCOUNTER — Ambulatory Visit
Admission: RE | Admit: 2022-01-27 | Discharge: 2022-01-27 | Disposition: A | Discharge status: Home / Self Care | Payer: Medicare HMO | Source: Ambulatory Visit | Attending: Pain Medicine | Admitting: Pain Medicine

## 2022-01-27 ENCOUNTER — Encounter: Payer: Medicare HMO | Admitting: Occupational Therapy

## 2022-01-27 ENCOUNTER — Encounter: Payer: Medicare HMO | Admitting: Speech Pathology

## 2022-01-27 ENCOUNTER — Ambulatory Visit: Payer: Medicare HMO | Attending: Pain Medicine | Admitting: Pain Medicine

## 2022-01-27 ENCOUNTER — Ambulatory Visit: Payer: Medicare HMO

## 2022-01-27 ENCOUNTER — Encounter: Payer: Self-pay | Admitting: Pain Medicine

## 2022-01-27 VITALS — BP 104/74 | HR 68 | Temp 97.3°F | Resp 18 | Ht 67.0 in | Wt 125.0 lb

## 2022-01-27 DIAGNOSIS — M5137 Other intervertebral disc degeneration, lumbosacral region: Secondary | ICD-10-CM | POA: Diagnosis present

## 2022-01-27 DIAGNOSIS — M545 Low back pain, unspecified: Secondary | ICD-10-CM | POA: Diagnosis not present

## 2022-01-27 DIAGNOSIS — G8929 Other chronic pain: Secondary | ICD-10-CM | POA: Diagnosis not present

## 2022-01-27 DIAGNOSIS — R937 Abnormal findings on diagnostic imaging of other parts of musculoskeletal system: Secondary | ICD-10-CM | POA: Diagnosis present

## 2022-01-27 DIAGNOSIS — G20B1 Parkinson's disease with dyskinesia, without mention of fluctuations: Secondary | ICD-10-CM | POA: Diagnosis present

## 2022-01-27 DIAGNOSIS — M79605 Pain in left leg: Secondary | ICD-10-CM | POA: Insufficient documentation

## 2022-01-27 DIAGNOSIS — M4310 Spondylolisthesis, site unspecified: Secondary | ICD-10-CM | POA: Insufficient documentation

## 2022-01-27 DIAGNOSIS — M79604 Pain in right leg: Secondary | ICD-10-CM | POA: Insufficient documentation

## 2022-01-27 DIAGNOSIS — M5417 Radiculopathy, lumbosacral region: Secondary | ICD-10-CM | POA: Insufficient documentation

## 2022-01-27 DIAGNOSIS — M4186 Other forms of scoliosis, lumbar region: Secondary | ICD-10-CM | POA: Diagnosis present

## 2022-01-27 MED ORDER — PENTAFLUOROPROP-TETRAFLUOROETH EX AERO
INHALATION_SPRAY | Freq: Once | CUTANEOUS | Status: AC
  Administered 2022-01-27: 30 via TOPICAL
  Filled 2022-01-27: qty 116

## 2022-01-27 MED ORDER — IOHEXOL 180 MG/ML  SOLN
10.0000 mL | Freq: Once | INTRAMUSCULAR | Status: AC
  Administered 2022-01-27: 10 mL via EPIDURAL
  Filled 2022-01-27: qty 20

## 2022-01-27 MED ORDER — LIDOCAINE HCL 2 % IJ SOLN
20.0000 mL | Freq: Once | INTRAMUSCULAR | Status: AC
  Administered 2022-01-27: 400 mg
  Filled 2022-01-27: qty 40

## 2022-01-27 MED ORDER — SODIUM CHLORIDE 0.9% FLUSH
2.0000 mL | Freq: Once | INTRAVENOUS | Status: AC
  Administered 2022-01-27: 2 mL

## 2022-01-27 MED ORDER — MIDAZOLAM HCL 2 MG/2ML IJ SOLN
0.5000 mg | Freq: Once | INTRAMUSCULAR | Status: AC
  Administered 2022-01-27: 1.5 mg via INTRAVENOUS
  Filled 2022-01-27: qty 2

## 2022-01-27 MED ORDER — ROPIVACAINE HCL 2 MG/ML IJ SOLN
2.0000 mL | Freq: Once | INTRAMUSCULAR | Status: AC
  Administered 2022-01-27: 2 mL via EPIDURAL
  Filled 2022-01-27: qty 20

## 2022-01-27 MED ORDER — LACTATED RINGERS IV SOLN: Freq: Once | INTRAVENOUS | Status: AC

## 2022-01-27 MED ORDER — TRIAMCINOLONE ACETONIDE 40 MG/ML IJ SUSP
40.0000 mg | Freq: Once | INTRAMUSCULAR | Status: AC
  Administered 2022-01-27: 40 mg
  Filled 2022-01-27: qty 1

## 2022-01-27 NOTE — Progress Notes (Signed)
Safety precautions to be maintained throughout the outpatient stay will include: orient to surroundings, keep bed in low position, maintain call bell within reach at all times, provide assistance with transfer out of bed and ambulation.  

## 2022-01-27 NOTE — Patient Instructions (Addendum)
Post-procedure Information What to expect: Most procedures involve the use of a local anesthetic (numbing medicine), and a steroid (anti-inflammatory medicine).  The local anesthetics may cause temporary numbness and weakness of the legs or arms, depending on the location of the block. This numbness/weakness may last 4-6 hours, depending on the local anesthetic used. In rare instances, it can last up to 24 hours. While numb, you must be very careful not to injure the extremity.  After any procedure, you could expect the pain to get better within 15-20 minutes. This relief is temporary and may last 4-6 hours. Once the local anesthetics wears off, you could experience discomfort, possibly more than usual, for up to 10 (ten) days. In the case of radiofrequencies, it may last up to 6 weeks. Surgeries may take up to 8 weeks for the healing process. The discomfort is due to the irritation caused by needles going through skin and muscle. To minimize the discomfort, we recommend using ice the first day, and heat from then on. The ice should be applied for 15 minutes on, and 15 minutes off. Keep repeating this cycle until bedtime. Avoid applying the ice directly to the skin, to prevent frostbite. Heat should be used daily, until the pain improves (4-10 days). Be careful not to burn yourself.  Occasionally you may experience muscle spasms or cramps. These occur as a consequence of the irritation caused by the needle sticks to the muscle and the blood that will inevitably be lost into the surrounding muscle tissue. Blood tends to be very irritating to tissues, which tend to react by going into spasm. These spasms may start the same day of your procedure, but they may also take days to develop. This late onset type of spasm or cramp is usually caused by electrolyte imbalances triggered by the steroids, at the level of the kidney. Cramps and spasms tend to respond well to muscle relaxants, multivitamins (some are  triggered by the procedure, but may have their origins in vitamin deficiencies), and "Gatorade", or any sports drinks that can replenish any electrolyte imbalances. (If you are a diabetic, ask your pharmacist to get you a sugar-free brand.) Warm showers or baths may also be helpful. Stretching exercises are highly recommended. General Instructions:  Be alert for signs of possible infection: redness, swelling, heat, red streaks, elevated temperature, and/or fever. These typically appear 4 to 6 days after the procedure. Immediately notify your doctor if you experience unusual bleeding, difficulty breathing, or loss of bowel or bladder control. If you experience increased pain, do not increase your pain medicine intake, unless instructed by your pain physician. Post-Procedure Care:  Be careful in moving about. Muscle spasms in the area of the injection may occur. Applying ice or heat to the area is often helpful. The incidence of spinal headaches after epidural injections ranges between 1.4% and 6%. If you develop a headache that does not seem to respond to conservative therapy, please let your physician know. This can be treated with an epidural blood patch.   Post-procedure numbness or redness is to be expected, however it should average 4 to 6 hours. If numbness and weakness of your extremities begins to develop 4 to 6 hours after your procedure, and is felt to be progressing and worsening, immediately contact your physician.   Diet:  If you experience nausea, do not eat until this sensation goes away. If you had a "Stellate Ganglion Block" for upper extremity "Reflex Sympathetic Dystrophy", do not eat or drink until your   hoarseness goes away. In any case, always start with liquids first and if you tolerate them well, then slowly progress to more solid foods. Activity:  For the first 4 to 6 hours after the procedure, use caution in moving about as you may experience numbness and/or weakness. Use caution in  cooking, using household electrical appliances, and climbing steps. If you need to reach your Doctor call our office: [phone] Monday-Thursday 8:00 am - 4:00 PM    Fridays: Closed     In case of an emergency: In case of emergency, call 911 or go to the nearest emergency room and have the physician there call us.  Interpretation of Procedure Every nerve block has two components: a diagnostic component, and a treatment component. Unrealistic expectations are the most common causes of "perceived failure".  In a perfect world, a single nerve block should be able to completely and permanently eliminate the pain. Sadly, the world is not perfect.  Most pain management nerve blocks are performed using local anesthetics and steroids. Steroids are responsible for any long-term benefit that you may experience. Their purpose is to decrease any chronic swelling that may exist in the area. Steroids begin to work immediately after being injected. However, most patients will not experience any benefits until 5 to 10 days after the injection, when the swelling has come down to the point where they can tell a difference. Steroids will only help if there is swelling to be treated. As such, they can assist with the diagnosis. If effective, they suggest an inflammatory component to the pain, and if ineffective, they rule out inflammation as the main cause or component of the problem. If the problem is one of mechanical compression, you will get no benefit from those steroids.   In the case of local anesthetics, they have a crucial role in the diagnosis of your condition. Most will begin to work within15 to 20 minutes after injection. The duration will depend on the type used (short- vs. Long-acting). It is of outmost importance that patients keep tract of their pain, after the procedure. To assist with this matter, a "Post-procedure Pain Diary" is provided. Make sure to complete it and to bring it back to your  follow-up appointment.  As long as the patient keeps accurate, detailed records of their symptoms after every procedure, and returns to have those interpreted, every procedure will provide us with invaluable information. Even a block that does not provide the patient with any relief, will always provide us with information about the mechanism and the origin of the pain. The only time a nerve block can be considered a waste of time is when patients do not keep track of the results, or do not keep their post-procedure appointment.  Reporting the results back to your physician The Pain Score  Pain is a subjective complaint. It cannot be seen, touched, or measured. We depend entirely on the patient's report of the pain in order to assess your condition and treatment. To evaluate the pain, we use a pain scale, where "0" means "No Pain", and a "10" is "the worst possible pain that you can even imagine" (i.e. something like been eaten alive by a shark or being torn apart by a lion).   You will frequently be asked to rate your pain. Please be as accurate, remember that medical decisions will be based on your responses. Please do not rate your pain above a 10. Doing so is actually interpreted as "symptom magnification" (exaggeration), as   well as lack of understanding with regards to the scale. To put this into perspective, when you tell us that your pain is at a 10 (ten), what you are saying is that there is nothing we can do to make this pain any worse. (Carefully think about that.)  ____________________________________________________________________________________________  Post-Procedure Discharge Instructions  Instructions: Apply ice:  Purpose: This will minimize any swelling and discomfort after procedure.  When: Day of procedure, as soon as you get home. How: Fill a plastic sandwich bag with crushed ice. Cover it with a small towel and apply to injection site. How long: (15 min on, 15 min off) Apply  for 15 minutes then remove x 15 minutes.  Repeat sequence on day of procedure, until you go to bed. Apply heat:  Purpose: To treat any soreness and discomfort from the procedure. When: Starting the next day after the procedure. How: Apply heat to procedure site starting the day following the procedure. How long: May continue to repeat daily, until discomfort goes away. Food intake: Start with clear liquids (like water) and advance to regular food, as tolerated.  Physical activities: Keep activities to a minimum for the first 8 hours after the procedure. After that, then as tolerated. Driving: If you have received any sedation, be responsible and do not drive. You are not allowed to drive for 24 hours after having sedation. Blood thinner: (Applies only to those taking blood thinners) You may restart your blood thinner 6 hours after your procedure. Insulin: (Applies only to Diabetic patients taking insulin) As soon as you can eat, you may resume your normal dosing schedule. Infection prevention: Keep procedure site clean and dry. Shower daily and clean area with soap and water. Post-procedure Pain Diary: Extremely important that this be done correctly and accurately. Recorded information will be used to determine the next step in treatment. For the purpose of accuracy, follow these rules: Evaluate only the area treated. Do not report or include pain from an untreated area. For the purpose of this evaluation, ignore all other areas of pain, except for the treated area. After your procedure, avoid taking a long nap and attempting to complete the pain diary after you wake up. Instead, set your alarm clock to go off every hour, on the hour, for the initial 8 hours after the procedure. Document the duration of the numbing medicine, and the relief you are getting from it. Do not go to sleep and attempt to complete it later. It will not be accurate. If you received sedation, it is likely that you were given a  medication that may cause amnesia. Because of this, completing the diary at a later time may cause the information to be inaccurate. This information is needed to plan your care. Follow-up appointment: Keep your post-procedure follow-up evaluation appointment after the procedure (usually 2 weeks for most procedures, 6 weeks for radiofrequencies). DO NOT FORGET to bring you pain diary with you.   Expect: (What should I expect to see with my procedure?) From numbing medicine (AKA: Local Anesthetics): Numbness or decrease in pain. You may also experience some weakness, which if present, could last for the duration of the local anesthetic. Onset: Full effect within 15 minutes of injected. Duration: It will depend on the type of local anesthetic used. On the average, 1 to 8 hours.  From steroids (Applies only if steroids were used): Decrease in swelling or inflammation. Once inflammation is improved, relief of the pain will follow. Onset of benefits: Depends on the   amount of swelling present. The more swelling, the longer it will take for the benefits to be seen. In some cases, up to 10 days. Duration: Steroids will stay in the system x 2 weeks. Duration of benefits will depend on multiple posibilities including persistent irritating factors. Side-effects: If present, they may typically last 2 weeks (the duration of the steroids). Frequent: Cramps (if they occur, drink Gatorade and take over-the-counter Magnesium 450-500 mg once to twice a day); water retention with temporary weight gain; increases in blood sugar; decreased immune system response; increased appetite. Occasional: Facial flushing (red, warm cheeks); mood swings; menstrual changes. Uncommon: Long-term decrease or suppression of natural hormones; bone thinning. (These are more common with higher doses or more frequent use. This is why we prefer that our patients avoid having any injection therapies in other practices.)  Very Rare: Severe mood  changes; psychosis; aseptic necrosis. From procedure: Some discomfort is to be expected once the numbing medicine wears off. This should be minimal if ice and heat are applied as instructed.  Call if: (When should I call?) You experience numbness and weakness that gets worse with time, as opposed to wearing off. New onset bowel or bladder incontinence. (Applies only to procedures done in the spine)  Emergency Numbers: Durning business hours (Monday - Thursday, 8:00 AM - 4:00 PM) (Friday, 9:00 AM - 12:00 Noon): [phone] After hours: [phone] NOTE: If you are having a problem and are unable connect with, or to talk to a provider, then go to your nearest urgent care or emergency department. If the problem is serious and urgent, please call 911. ____________________________________________________________________________________________    ____________________________________________________________________________________________  Virtual Visits   What is a "Virtual Visit"? It is a healthcare communication encounter (medical visit) that takes place on real time (NOT TEXT or E-MAIL) over the telephone or computer device (desktop, laptop, tablet, smart phone, etc.). It allows for more location flexibility between the patient and the healthcare provider.  Who decides when these types of visits will be used? The physician.  Who is eligible for these types of visits? Only those patients that can be reliably reached over the telephone.  What do you mean by reliably? We do not have time to call everyone multiple times, therefore those that tend to screen calls and then call back later are not suitable candidates for this system. We understand how people are reluctant to pickup on "unknown" calls, therefore, we suggest adding our telephone numbers to your list of "CONTACT(s)". This way, you should be able to readily identify our calls when you receive one. All of our numbers are  available below.   Who is not eligible? This option is not available for medication management encounters, specially for controlled substances. Patients on pain medications that fall under the category of controlled substances have to come in for "Face-to-Face" encounters. This is required for mandatory monitoring of these substances. You may be asked to provide a sample for an unannounced urine drug screening test (UDS), and we will need to count your pain pills. Not bringing your pills to be counted may result in no refill. Obviously, neither one of these can be done over the phone.  When will this type of visits be used? You can request a virtual visit whenever you are physically unable to attend a regular appointment. The decision will be made by the physician (or healthcare provider) on a case by case basis.   At what time will I be called? This is an   excellent question. The providers will try to call you whenever they have time available. Do not expect to be called at any specific time. The secretaries will assign you a time for your virtual visit appointment, but this is done simply to keep a list of those patients that need to be called, but not for the purpose of keeping a time schedule. Be advised that the call may come in anytime during the day, between the hours of 8:00 AM and 8::00 PM, depending on provider availability. We do understand that the system is not perfect. If you are unable to be available that day on a moments notice, then request an "in-person" appointment rather than a "virtual visit".  Can I request my medication visits to be "Virtual"? Yes you may request it, but the decision is entirely up to the healthcare provider. Control substances require specific monitoring that requires Face-to-Face encounters. The number of encounters  and the extent of the monitoring is determined on a case by case basis.  Add a new contact to your smart phone and label it "PAIN CLINIC" Under  this contact add the following numbers: Main: [phone] (Official Contact Number) Nurses: [phone] (These are outgoing only calling systems. Do not call this number.) Dr. Naveira: [phone] or [phone] (Outgoing calls only. Do not call this number.)  ____________________________________________________________________________________________    

## 2022-01-28 ENCOUNTER — Telehealth: Payer: Self-pay

## 2022-01-28 ENCOUNTER — Other Ambulatory Visit: Payer: Self-pay | Admitting: Psychiatry

## 2022-01-28 DIAGNOSIS — F411 Generalized anxiety disorder: Secondary | ICD-10-CM

## 2022-01-28 NOTE — Telephone Encounter (Signed)
Post procedure follow up.  Patients wife states he is doing ok.

## 2022-01-29 ENCOUNTER — Ambulatory Visit: Payer: Medicare HMO

## 2022-01-29 ENCOUNTER — Encounter: Payer: Medicare HMO | Admitting: Speech Pathology

## 2022-02-03 ENCOUNTER — Encounter: Payer: Medicare HMO | Admitting: Speech Pathology

## 2022-02-03 ENCOUNTER — Encounter: Payer: Medicare HMO | Admitting: Occupational Therapy

## 2022-02-03 ENCOUNTER — Ambulatory Visit: Payer: Medicare HMO | Attending: Neurology

## 2022-02-03 DIAGNOSIS — R2689 Other abnormalities of gait and mobility: Secondary | ICD-10-CM | POA: Diagnosis not present

## 2022-02-03 DIAGNOSIS — G20C Parkinsonism, unspecified: Secondary | ICD-10-CM | POA: Diagnosis not present

## 2022-02-03 DIAGNOSIS — M6281 Muscle weakness (generalized): Secondary | ICD-10-CM | POA: Diagnosis not present

## 2022-02-03 DIAGNOSIS — R2681 Unsteadiness on feet: Secondary | ICD-10-CM | POA: Insufficient documentation

## 2022-02-03 DIAGNOSIS — R262 Difficulty in walking, not elsewhere classified: Secondary | ICD-10-CM | POA: Insufficient documentation

## 2022-02-03 DIAGNOSIS — R269 Unspecified abnormalities of gait and mobility: Secondary | ICD-10-CM | POA: Diagnosis not present

## 2022-02-03 DIAGNOSIS — R278 Other lack of coordination: Secondary | ICD-10-CM | POA: Insufficient documentation

## 2022-02-03 NOTE — Therapy (Signed)
OUTPATIENT PHYSICAL THERAPY NEURO TREATMENT Patient Name: Joseph Arroyo MRN: [id number] DOB:05/17/1940, 81 y.o., male Today's Date: 02/03/2022  PCP: Joseph Hauser, DO REFERRING PROVIDER: Lavena Bullion, NP   PT End of Session - 02/03/22 1421     Visit Number 35    Number of Visits 40    Date for PT Re-Evaluation 03/17/22    Authorization Type [employer]    Authorization Time Period 11/13/2021-02/05/2022    Progress Note Due on Visit 40    PT Start Time 1430    PT Stop Time 1515    PT Time Calculation (min) 45 min    Equipment Utilized During Treatment Gait belt    Activity Tolerance Patient tolerated treatment well;No increased pain    Behavior During Therapy [facility] for tasks assessed/performed               Past Medical History:  Diagnosis Date   Allergic rhinitis due to pollen 11/21/2007   Brachial neuritis or radiculitis NOS    Cervicalgia    Concussion    age 62 - s/p accident   Costal chondritis    Depression    Essential and other specified forms of tremor    GERD (gastroesophageal reflux disease)    in past   Lumbago    Occlusion and stenosis of carotid artery without mention of cerebral infarction    Seizures [facility])    age 69 - after concussion   Past Surgical History:  Procedure Laterality Date   CATARACT EXTRACTION Right 07/18/14   CATARACT EXTRACTION W/PHACO Left 08/01/2014   Procedure: CATARACT EXTRACTION PHACO AND INTRAOCULAR LENS PLACEMENT ([place]);  Surgeon: Joseph Koyanagi, MD;  Location: [place];  Service: Ophthalmology;  Laterality: Left;  IVA TOPICAL   COLONOSCOPY  2008   OTHER SURGICAL HISTORY  2002   ear surgery   STAPEDES SURGERY Right 2002   Patient Active Problem List   Diagnosis Date Noted   Parkinson's disease with dyskinesia 01/27/2022   Dysphagia, pharyngeal phase 09/17/2021   Cervicalgia 08/05/2021   Spondylosis without myelopathy or radiculopathy, lumbosacral region 06/24/2021   Lumbar central  spinal stenosis, w/o neurogenic claudication (L4-5) 06/03/2021   Lumbar lateral recess stenosis (Bilateral: L2-3, L4-5) (Right: L3-4) 06/03/2021   Lumbosacral foraminal stenosis (Bilateral: L2-3, L3-4) (Left: L5-S1) 06/03/2021   Lumbar nerve root impingement (Right: L3 at L2-3 & L3-4) 06/03/2021   Ligamentum flavum hypertrophy (L3-4, L4-5) 06/03/2021   Abnormal MRI, lumbar spine (04/23/2021) 04/24/2021   Long term prescription benzodiazepine use (alprazolam) (Xanax) 03/24/2021   Grade 1 Retrolisthesis of L2/L3 (5 mm) and L3/L4 (3 mm) 03/24/2021   Levoscoliosis of lumbar spine (L3-4 apex) 03/24/2021   DDD (degenerative disc disease), lumbosacral 03/24/2021   Lumbosacral facet arthropathy (Left: L3-4, L4-5, and L5-S1) 03/24/2021   Tricompartment osteoarthritis of knee (Left) 03/24/2021   Baker cyst (Left) 03/24/2021   Chronic low back pain (1ry area of Pain) (Bilateral) (R>L) w/o sciatica 03/24/2021   Lumbar facet syndrome (Bilateral) 03/24/2021   Chronic lower extremity pain (2ry area of Pain) (Bilateral) (L>R) 03/24/2021   Lumbosacral radiculitis/sensory radiculopathy at L2 (Bilateral) 03/24/2021   Lumbosacral radiculitis/sensory radiculopathy at L3 (Bilateral) 03/24/2021   Chronic pain syndrome 03/23/2021   Pharmacologic therapy 03/23/2021   Disorder of skeletal system 03/23/2021   Problems influencing health status 03/23/2021   PAD (peripheral artery disease) ([place]) 10/01/2020   GAD (generalized anxiety disorder) 01/13/2018   MDD (major depressive disorder) 77/82/4235   Umbilical hernia 36/14/4315   Chronic knee pain (  Left) 08/11/2017   Derangement of medial meniscus, posterior horn (Left) 08/11/2017   Vitamin D insufficiency 03/05/2016   BPH without obstruction/lower urinary tract symptoms 07/08/2015   Chronic fatigue 10/23/2014   Major depression, recurrent, full remission ([place]) 07/26/2013   Unsteady gait 07/26/2013   Orthostatic hypotension 07/26/2013   Depression 07/26/2013    Anxiety 06/23/2013   Chronic anxiety 06/23/2013   Tremor 05/18/2013   Bilateral carotid artery stenosis 08/30/2012    ONSET DATE: 08/11/2021  REFERRING DIAG: Parkinsons Disease  THERAPY DIAG:  Difficulty in walking, not elsewhere classified  Muscle weakness (generalized)  Unsteadiness on feet  Abnormality of gait and mobility  Other abnormalities of gait and mobility  Atypical parkinsonism  Other lack of coordination  Rationale for Evaluation and Treatment Rehabilitation  SUBJECTIVE:   Pt reports he has been doing ok.  He notes he had an epidural on his birthday and is returning to the pain clinic next week to discuss some options.  Pt accompanied by: Brother visiting from [place]  PERTINENT HISTORY: Joseph Arroyo is a 23yoM who is referred to OPPT due to 2 falls in the setting of chronic parkinson's disease. Pt AMB with RW at baseline but is very physically active.    PAIN: Are you having pain? Yes: NPRS scale: 4/10 Pain location: low back Pain description: ache/soreness Aggravating factors: prolonged standing, transfers, walking Relieving factors: rest and medications   PATIENT GOALS  to walk better and be as active as possible. Also to get in/out of cars better.   OBJECTIVE:    Therex:   Hot pack with a pillow placed on mat table to decrease pain in the patient's lower back region during all supine activities.   Pt educated on TrA activation and performance with use of tactile feedback:  Instruction in TrA contraction, 5 sec holds and tactile feedback utilized by pt by placing fingers over region of contraction, x10  Hooklying TrA contraction with marches, x10 reps each LE Hooklying TrA contraction with hip adduction into physioball, x10 with 3 sec holds Hooklying TrA contraction with hip ER, BTB for resistance, x10  Hooklying TrA contraction with deadbug, requiring tactile feedback from therapist for proper sequencing x10 reps each  *no pain  reported - mini rest break between each sets      PATIENT EDUCATION: Education details: Pt educated throughout session about proper posture and technique with exercises. Improved exercise technique, movement at target joints, use of target muscles after min to mod verbal, visual, tactile cues.  Person educated: Patient Education method: Explanation, Demonstration, Tactile cues, and Verbal cues Education comprehension: verbalized understanding, returned demonstration, verbal cues required, tactile cues required, and needs further education   HOME EXERCISE PROGRAM:  Access Code: [email or web address] URL: [email or web address] Date: 12/09/2021 Prepared by: Amalia Hailey  Exercises - Seated Alternating Side Stretch with Arm Overhead  - 1 x daily - 7 x weekly - 2 sets - 10 reps - Seated Thoracic Extension Arms Overhead  - 1 x daily - 7 x weekly - 2 sets - 10 reps  GOALS: Goals reviewed with patient? Yes  SHORT TERM GOALS: Target date: 10/02/2021  Pt will be independent with initial HEP in order to improve strength and balance in order to decrease fall risk and improve function at home and work.  Baseline: 08/21/2021-No formal HEP in place Goal status: MET   LONG TERM GOALS: Target date: 02/05/2022  Pt will improve BERG by at least 3 points in order to demonstrate clinically significant improvement in  balance.   Baseline: 08/21/2021= 48/56 8/24:47/56; 11/13/2021= 47/56, 01/06/2022 = Unable to test due to time constraint- will test next session and update goal; 01/08/2022= 51/56- will keep goal active to ensure consistency. Goal status: ongoing  2.  Pt will be independent with final HEP in order to improve strength and balance in order to decrease fall risk and improve function at home and work.  Baseline: No HEP in place, 8/24: completing current HEP but still progressing; 11/13/2021- Patient continues to verbalize and demonstrate understanding of HEP for stretching/strengthening and  mobility but still progressing and altering program as appropriate; 11/7: Continues follow current HEP but still progressing HEP as patient progresses  Goal status: IN PROGRESS  3.  Pt will decrease 5TSTS by at least 4 seconds in order to demonstrate clinically significant improvement in LE strength. Baseline: 08/21/2021= 19.01 sec without UE support 8/24:10.56 sec Goal status: MET  4.  Pt will decrease TUG to below 14 seconds/decrease in order to demonstrate decreased fall risk. Baseline:  08/21/2021=15.67 with RW 8/24: 12.41 Goal status: MET  5.  Pt will improve FOTO to target score of 56 to display perceived improvements in ability to complete ADL's.  Baseline: 08/21/2021=49 8/24: 45.6; 11/13/2021=46, 01/06/2022 = 45.6 Goal status: IN PROGRESS  6.  Pt will increase 10MWT by at least 0.15 m/s in order to demonstrate clinically significant improvement in community ambulation.   Baseline: 08/22/2021=0.94 m/s 8/24:1.19ms Goal status: MET  7 Pt will improve miniBESTest by at least 4 points to demonstrate clinically significant improvement in reduced falls with parkinsonian related deficits.  Baseline: 19/28; 11/13/2021- Unable to test due to time constraint- will test next session and update goal. 9/19: 18/28; 01/06/2022 - 23/28 Goal Status: MET  8. Pt will increase 6MWT by at least [id number] order to demonstrate clinically significant improvement in cardiopulmonary endurance and community ambulation  Baseline: 11/13/2021= 650 feet with walker; 01/06/2022 = 1160 feet with walker.  Goal Status: MET   ASSESSMENT:  CLINICAL IMPRESSION:  Pt able to demonstrate increased core activities with improved function compared to previous session.  Pt is making good progress with activities and is able to improve overall coordination with tasks as well.  Pt did receive a few rest breaks to allow tremors to calm down and for water breaks.   Pt will continue to benefit from skilled therapy to address  remaining deficits in order to improve overall QoL and return to PLOF.      OBJECTIVE IMPAIRMENTS Abnormal gait, decreased activity tolerance, decreased balance, decreased coordination, decreased endurance, decreased knowledge of use of DME, decreased mobility, difficulty walking, decreased ROM, decreased strength, hypomobility, and impaired flexibility.   ACTIVITY LIMITATIONS carrying, lifting, bending, standing, squatting, sleeping, stairs, transfers, continence, bathing, toileting, and dressing  PARTICIPATION LIMITATIONS: cleaning, laundry, medication management, driving, shopping, community activity, and yard work  PERSONAL FACTORS Age are also affecting patient's functional outcome.   REHAB POTENTIAL: Good  CLINICAL DECISION MAKING: Stable/uncomplicated  EVALUATION COMPLEXITY: Low  PLAN: PT FREQUENCY: 2x/week  PT DURATION: 12 weeks  PLANNED INTERVENTIONS: Therapeutic exercises, Therapeutic activity, Neuromuscular re-education, Balance training, Gait training, Patient/Family education, Joint mobilization, Stair training, Vestibular training, Canalith repositioning, DME instructions, Dry Needling, Electrical stimulation, Spinal manipulation, and Spinal mobilization  PLAN FOR NEXT SESSION:  balance   JosGwenlyn SaranT, DPT Physical Therapist- [facility]2/05/23, 2:21 PM

## 2022-02-05 ENCOUNTER — Ambulatory Visit: Payer: Medicare HMO

## 2022-02-05 ENCOUNTER — Encounter: Payer: Medicare HMO | Admitting: Occupational Therapy

## 2022-02-05 ENCOUNTER — Encounter: Payer: Medicare HMO | Admitting: Speech Pathology

## 2022-02-06 ENCOUNTER — Ambulatory Visit: Payer: Medicare HMO

## 2022-02-06 DIAGNOSIS — R2681 Unsteadiness on feet: Secondary | ICD-10-CM

## 2022-02-06 DIAGNOSIS — M6281 Muscle weakness (generalized): Secondary | ICD-10-CM

## 2022-02-06 DIAGNOSIS — R278 Other lack of coordination: Secondary | ICD-10-CM | POA: Diagnosis not present

## 2022-02-06 DIAGNOSIS — G20C Parkinsonism, unspecified: Secondary | ICD-10-CM | POA: Diagnosis not present

## 2022-02-06 DIAGNOSIS — R262 Difficulty in walking, not elsewhere classified: Secondary | ICD-10-CM

## 2022-02-06 DIAGNOSIS — R269 Unspecified abnormalities of gait and mobility: Secondary | ICD-10-CM | POA: Diagnosis not present

## 2022-02-06 DIAGNOSIS — R2689 Other abnormalities of gait and mobility: Secondary | ICD-10-CM | POA: Diagnosis not present

## 2022-02-06 NOTE — Therapy (Signed)
OUTPATIENT PHYSICAL THERAPY NEURO TREATMENT Patient Name: Joseph Arroyo MRN: [id number] DOB:[birth year], 81 y.o., male Today's Date: 02/06/2022  PCP: Olin Hauser, DO REFERRING PROVIDER: Lavena Bullion, NP       Past Medical History:  Diagnosis Date   Allergic rhinitis due to pollen 11/21/2007   Brachial neuritis or radiculitis NOS    Cervicalgia    Concussion    age 47 - s/p accident   Costal chondritis    Depression    Essential and other specified forms of tremor    GERD (gastroesophageal reflux disease)    in past   Lumbago    Occlusion and stenosis of carotid artery without mention of cerebral infarction    Seizures [facility])    age 43 - after concussion   Past Surgical History:  Procedure Laterality Date   CATARACT EXTRACTION Right 07/18/14   CATARACT EXTRACTION W/PHACO Left 08/01/2014   Procedure: CATARACT EXTRACTION PHACO AND INTRAOCULAR LENS PLACEMENT ([place]);  Surgeon: Leandrew Koyanagi, MD;  Location: [place];  Service: Ophthalmology;  Laterality: Left;  IVA TOPICAL   COLONOSCOPY  2008   OTHER SURGICAL HISTORY  2002   ear surgery   STAPEDES SURGERY Right 2002   Patient Active Problem List   Diagnosis Date Noted   Parkinson's disease with dyskinesia 01/27/2022   Dysphagia, pharyngeal phase 09/17/2021   Cervicalgia 08/05/2021   Spondylosis without myelopathy or radiculopathy, lumbosacral region 06/24/2021   Lumbar central spinal stenosis, w/o neurogenic claudication (L4-5) 06/03/2021   Lumbar lateral recess stenosis (Bilateral: L2-3, L4-5) (Right: L3-4) 06/03/2021   Lumbosacral foraminal stenosis (Bilateral: L2-3, L3-4) (Left: L5-S1) 06/03/2021   Lumbar nerve root impingement (Right: L3 at L2-3 & L3-4) 06/03/2021   Ligamentum flavum hypertrophy (L3-4, L4-5) 06/03/2021   Abnormal MRI, lumbar spine (04/23/2021) 04/24/2021   Long term prescription benzodiazepine use (alprazolam) (Xanax) 03/24/2021   Grade 1 Retrolisthesis of L2/L3 (5  mm) and L3/L4 (3 mm) 03/24/2021   Levoscoliosis of lumbar spine (L3-4 apex) 03/24/2021   DDD (degenerative disc disease), lumbosacral 03/24/2021   Lumbosacral facet arthropathy (Left: L3-4, L4-5, and L5-S1) 03/24/2021   Tricompartment osteoarthritis of knee (Left) 03/24/2021   Baker cyst (Left) 03/24/2021   Chronic low back pain (1ry area of Pain) (Bilateral) (R>L) w/o sciatica 03/24/2021   Lumbar facet syndrome (Bilateral) 03/24/2021   Chronic lower extremity pain (2ry area of Pain) (Bilateral) (L>R) 03/24/2021   Lumbosacral radiculitis/sensory radiculopathy at L2 (Bilateral) 03/24/2021   Lumbosacral radiculitis/sensory radiculopathy at L3 (Bilateral) 03/24/2021   Chronic pain syndrome 03/23/2021   Pharmacologic therapy 03/23/2021   Disorder of skeletal system 03/23/2021   Problems influencing health status 03/23/2021   PAD (peripheral artery disease) ([place]) 10/01/2020   GAD (generalized anxiety disorder) 01/13/2018   MDD (major depressive disorder) 59/56/3875   Umbilical hernia 64/33/2951   Chronic knee pain (Left) 08/11/2017   Derangement of medial meniscus, posterior horn (Left) 08/11/2017   Vitamin D insufficiency 03/05/2016   BPH without obstruction/lower urinary tract symptoms 07/08/2015   Chronic fatigue 10/23/2014   Major depression, recurrent, full remission ([place]) 07/26/2013   Unsteady gait 07/26/2013   Orthostatic hypotension 07/26/2013   Depression 07/26/2013   Anxiety 06/23/2013   Chronic anxiety 06/23/2013   Tremor 05/18/2013   Bilateral carotid artery stenosis 08/30/2012    ONSET DATE: 08/11/2021  REFERRING DIAG: Parkinsons Disease  THERAPY DIAG:  No diagnosis found.  Rationale for Evaluation and Treatment Rehabilitation  SUBJECTIVE:   Pt reports he has been doing ok.  He notes he  had an epidural on his birthday and is returning to the pain clinic next week to discuss some options.  Pt accompanied by: Brother visiting from [place]  PERTINENT HISTORY:  Joseph Arroyo is a 3yoM who is referred to OPPT due to 2 falls in the setting of chronic parkinson's disease. Pt AMB with RW at baseline but is very physically active.    PAIN: Are you having pain? Yes: NPRS scale: 4/10 Pain location: low back Pain description: ache/soreness Aggravating factors: prolonged standing, transfers, walking Relieving factors: rest and medications   PATIENT GOALS  to walk better and be as active as possible. Also to get in/out of cars better.   OBJECTIVE:    Therex:   Hot pack with a pillow placed on mat table to decrease pain in the patient's lower back region during all supine activities.   Pt educated on TrA activation and performance with use of tactile feedback:  Instruction in TrA contraction, 5 sec holds and tactile feedback utilized by pt by placing fingers over region of contraction, x10  Hooklying TrA contraction with marches, x10 reps each LE Hooklying TrA contraction with hip adduction into physioball, x10 with 3 sec holds Hooklying TrA contraction with hip ER, BTB for resistance, x10  Hooklying TrA contraction with deadbug, requiring tactile feedback from therapist for proper sequencing x10 reps each Added penguin crunch (supine- hooklye)  *no pain reported - mini rest break between each sets      PATIENT EDUCATION: Access Code: [email or web address] URL: [email or web address] Date: 02/06/2022 Prepared by: Sande Brothers  Exercises - Supine Transversus Abdominis Bracing - Hands on Stomach  - 1 x daily - 7 x weekly - 3 sets - 10 reps - Supine Transversus Abdominis Bracing with Double Leg Fallout  - 1 x daily - 7 x weekly - 3 sets - 10 reps - Supine Hip Adduction Isometric with Ball  - 1 x daily - 7 x weekly - 3 sets - 10 reps    Education details: Pt educated throughout session about proper posture and technique with exercises. Improved exercise technique, movement at target joints, use of target muscles after min to mod  verbal, visual, tactile cues.  Person educated: Patient Education method: Explanation, Demonstration, Tactile cues, and Verbal cues Education comprehension: verbalized understanding, returned demonstration, verbal cues required, tactile cues required, and needs further education   HOME EXERCISE PROGRAM:  Access Code: [email or web address] URL: [email or web address] Date: 12/09/2021 Prepared by: Amalia Hailey  Exercises - Seated Alternating Side Stretch with Arm Overhead  - 1 x daily - 7 x weekly - 2 sets - 10 reps - Seated Thoracic Extension Arms Overhead  - 1 x daily - 7 x weekly - 2 sets - 10 reps  GOALS: Goals reviewed with patient? Yes  SHORT TERM GOALS: Target date: 10/02/2021  Pt will be independent with initial HEP in order to improve strength and balance in order to decrease fall risk and improve function at home and work.  Baseline: 08/21/2021-No formal HEP in place Goal status: MET   LONG TERM GOALS: Target date: 02/05/2022  Pt will improve BERG by at least 3 points in order to demonstrate clinically significant improvement in balance.   Baseline: 08/21/2021= 48/56 8/24:47/56; 11/13/2021= 47/56, 01/06/2022 = Unable to test due to time constraint- will test next session and update goal; 01/08/2022= 51/56- will keep goal active to ensure consistency. Goal status: ongoing  2.  Pt will be independent with final HEP in order to improve strength and balance in order  to decrease fall risk and improve function at home and work.  Baseline: No HEP in place, 8/24: completing current HEP but still progressing; 11/13/2021- Patient continues to verbalize and demonstrate understanding of HEP for stretching/strengthening and mobility but still progressing and altering program as appropriate; 11/7: Continues follow current HEP but still progressing HEP as patient progresses  Goal status: IN PROGRESS  3.  Pt will decrease 5TSTS by at least 4 seconds in order to demonstrate clinically significant  improvement in LE strength. Baseline: 08/21/2021= 19.01 sec without UE support 8/24:10.56 sec Goal status: MET  4.  Pt will decrease TUG to below 14 seconds/decrease in order to demonstrate decreased fall risk. Baseline:  08/21/2021=15.67 with RW 8/24: 12.41 Goal status: MET  5.  Pt will improve FOTO to target score of 56 to display perceived improvements in ability to complete ADL's.  Baseline: 08/21/2021=49 8/24: 45.6; 11/13/2021=46, 01/06/2022 = 45.6 Goal status: IN PROGRESS  6.  Pt will increase 10MWT by at least 0.15 m/s in order to demonstrate clinically significant improvement in community ambulation.   Baseline: 08/22/2021=0.94 m/s 8/24:1.30ms Goal status: MET  7 Pt will improve miniBESTest by at least 4 points to demonstrate clinically significant improvement in reduced falls with parkinsonian related deficits.  Baseline: 19/28; 11/13/2021- Unable to test due to time constraint- will test next session and update goal. 9/19: 18/28; 01/06/2022 - 23/28 Goal Status: MET  8. Pt will increase 6MWT by at least [id number] order to demonstrate clinically significant improvement in cardiopulmonary endurance and community ambulation  Baseline: 11/13/2021= 650 feet with walker; 01/06/2022 = 1160 feet with walker.  Goal Status: MET   ASSESSMENT:  CLINICAL IMPRESSION:  Pt able to demonstrate increased core activities with improved function compared to previous session.  Pt is making good progress with activities and is able to improve overall coordination with tasks as well.  Pt did receive a few rest breaks to allow tremors to calm down and for water breaks.   Pt will continue to benefit from skilled therapy to address remaining deficits in order to improve overall QoL and return to PLOF.      OBJECTIVE IMPAIRMENTS Abnormal gait, decreased activity tolerance, decreased balance, decreased coordination, decreased endurance, decreased knowledge of use of DME, decreased mobility, difficulty  walking, decreased ROM, decreased strength, hypomobility, and impaired flexibility.   ACTIVITY LIMITATIONS carrying, lifting, bending, standing, squatting, sleeping, stairs, transfers, continence, bathing, toileting, and dressing  PARTICIPATION LIMITATIONS: cleaning, laundry, medication management, driving, shopping, community activity, and yard work  PERSONAL FACTORS Age are also affecting patient's functional outcome.   REHAB POTENTIAL: Good  CLINICAL DECISION MAKING: Stable/uncomplicated  EVALUATION COMPLEXITY: Low  PLAN: PT FREQUENCY: 2x/week  PT DURATION: 12 weeks  PLANNED INTERVENTIONS: Therapeutic exercises, Therapeutic activity, Neuromuscular re-education, Balance training, Gait training, Patient/Family education, Joint mobilization, Stair training, Vestibular training, Canalith repositioning, DME instructions, Dry Needling, Electrical stimulation, Spinal manipulation, and Spinal mobilization  PLAN FOR NEXT SESSION:  balance   JefOllen BowlT Physical Therapist- [facility]2/08/23, 7:41 AM

## 2022-02-09 ENCOUNTER — Ambulatory Visit: Payer: Medicare HMO

## 2022-02-09 DIAGNOSIS — R2689 Other abnormalities of gait and mobility: Secondary | ICD-10-CM | POA: Diagnosis not present

## 2022-02-09 DIAGNOSIS — R278 Other lack of coordination: Secondary | ICD-10-CM | POA: Diagnosis not present

## 2022-02-09 DIAGNOSIS — R262 Difficulty in walking, not elsewhere classified: Secondary | ICD-10-CM | POA: Diagnosis not present

## 2022-02-09 DIAGNOSIS — M6281 Muscle weakness (generalized): Secondary | ICD-10-CM | POA: Diagnosis not present

## 2022-02-09 DIAGNOSIS — G20C Parkinsonism, unspecified: Secondary | ICD-10-CM | POA: Diagnosis not present

## 2022-02-09 DIAGNOSIS — R269 Unspecified abnormalities of gait and mobility: Secondary | ICD-10-CM | POA: Diagnosis not present

## 2022-02-09 DIAGNOSIS — R2681 Unsteadiness on feet: Secondary | ICD-10-CM | POA: Diagnosis not present

## 2022-02-09 NOTE — Therapy (Signed)
OUTPATIENT PHYSICAL THERAPY NEURO TREATMENT Patient Name: Joseph Arroyo MRN: [id number] DOB:1940-08-22, 81 y.o., male Today's Date: 02/09/2022  PCP: Olin Hauser, DO REFERRING PROVIDER: Lavena Bullion, NP   PT End of Session - 02/09/22 1652     Visit Number 37    Number of Visits 40    Date for PT Re-Evaluation 03/17/22    Authorization Type [employer]    Authorization Time Period 11/13/2021-02/05/2022    Progress Note Due on Visit 21    PT Start Time 1602    PT Stop Time 1644    PT Time Calculation (min) 42 min    Equipment Utilized During Treatment Gait belt    Activity Tolerance Patient tolerated treatment well;No increased pain    Behavior During Therapy [facility] for tasks assessed/performed                Past Medical History:  Diagnosis Date   Allergic rhinitis due to pollen 11/21/2007   Brachial neuritis or radiculitis NOS    Cervicalgia    Concussion    age 43 - s/p accident   Costal chondritis    Depression    Essential and other specified forms of tremor    GERD (gastroesophageal reflux disease)    in past   Lumbago    Occlusion and stenosis of carotid artery without mention of cerebral infarction    Seizures [facility])    age 70 - after concussion   Past Surgical History:  Procedure Laterality Date   CATARACT EXTRACTION Right 07/18/14   CATARACT EXTRACTION W/PHACO Left 08/01/2014   Procedure: CATARACT EXTRACTION PHACO AND INTRAOCULAR LENS PLACEMENT ([place]);  Surgeon: Leandrew Koyanagi, MD;  Location: [place];  Service: Ophthalmology;  Laterality: Left;  IVA TOPICAL   COLONOSCOPY  2008   OTHER SURGICAL HISTORY  2002   ear surgery   STAPEDES SURGERY Right 2002   Patient Active Problem List   Diagnosis Date Noted   Parkinson's disease with dyskinesia 01/27/2022   Dysphagia, pharyngeal phase 09/17/2021   Cervicalgia 08/05/2021   Spondylosis without myelopathy or radiculopathy, lumbosacral region 06/24/2021   Lumbar central  spinal stenosis, w/o neurogenic claudication (L4-5) 06/03/2021   Lumbar lateral recess stenosis (Bilateral: L2-3, L4-5) (Right: L3-4) 06/03/2021   Lumbosacral foraminal stenosis (Bilateral: L2-3, L3-4) (Left: L5-S1) 06/03/2021   Lumbar nerve root impingement (Right: L3 at L2-3 & L3-4) 06/03/2021   Ligamentum flavum hypertrophy (L3-4, L4-5) 06/03/2021   Abnormal MRI, lumbar spine (04/23/2021) 04/24/2021   Long term prescription benzodiazepine use (alprazolam) (Xanax) 03/24/2021   Grade 1 Retrolisthesis of L2/L3 (5 mm) and L3/L4 (3 mm) 03/24/2021   Levoscoliosis of lumbar spine (L3-4 apex) 03/24/2021   DDD (degenerative disc disease), lumbosacral 03/24/2021   Lumbosacral facet arthropathy (Left: L3-4, L4-5, and L5-S1) 03/24/2021   Tricompartment osteoarthritis of knee (Left) 03/24/2021   Baker cyst (Left) 03/24/2021   Chronic low back pain (1ry area of Pain) (Bilateral) (R>L) w/o sciatica 03/24/2021   Lumbar facet syndrome (Bilateral) 03/24/2021   Chronic lower extremity pain (2ry area of Pain) (Bilateral) (L>R) 03/24/2021   Lumbosacral radiculitis/sensory radiculopathy at L2 (Bilateral) 03/24/2021   Lumbosacral radiculitis/sensory radiculopathy at L3 (Bilateral) 03/24/2021   Chronic pain syndrome 03/23/2021   Pharmacologic therapy 03/23/2021   Disorder of skeletal system 03/23/2021   Problems influencing health status 03/23/2021   PAD (peripheral artery disease) ([place]) 10/01/2020   GAD (generalized anxiety disorder) 01/13/2018   MDD (major depressive disorder) 24/46/2863   Umbilical hernia 81/77/1165   Chronic knee  pain (Left) 08/11/2017   Derangement of medial meniscus, posterior horn (Left) 08/11/2017   Vitamin D insufficiency 03/05/2016   BPH without obstruction/lower urinary tract symptoms 07/08/2015   Chronic fatigue 10/23/2014   Major depression, recurrent, full remission ([place]) 07/26/2013   Unsteady gait 07/26/2013   Orthostatic hypotension 07/26/2013   Depression 07/26/2013    Anxiety 06/23/2013   Chronic anxiety 06/23/2013   Tremor 05/18/2013   Bilateral carotid artery stenosis 08/30/2012    ONSET DATE: 08/11/2021  REFERRING DIAG: Parkinsons Disease  THERAPY DIAG:  Muscle weakness (generalized)  Difficulty in walking, not elsewhere classified  Other lack of coordination  Rationale for Evaluation and Treatment Rehabilitation  SUBJECTIVE:   Pt reports his back is OK currently. He reports he had an injection about two weeks ago to treat his LBP. He has a follow-up at the pain clinic tomorrow.  Pt accompanied by: spouse  PERTINENT HISTORY: Joseph Arroyo is a 27yoM who is referred to OPPT due to 2 falls in the setting of chronic parkinson's disease. Pt AMB with RW at baseline but is very physically active.    PAIN: Are you having pain? Yes: NPRS scale: 0/10 Pain location: low back Pain description: ache/soreness Aggravating factors: prolonged standing, transfers, walking Relieving factors: rest and medications   PATIENT GOALS  to walk better and be as active as possible. Also to get in/out of cars better.   OBJECTIVE:    Therex:   Pt supported hooklye on plinth with large bolster under B knees to improve comfort in lower back region during all supine activities.    TrA contraction, 5 sec holds with tactile feedback utilized by pt by placing fingers over region of contraction 1x10  Hooklying TrA contraction with 6" from mat- marches, 2x10 reps each LE. Pt provides TC to maintain ROM.  Hooklying TrA contraction with hip adduction into physioball, 4x5 with 5 sec holds/rep  Hooklying TrA contraction with hip ER, GTB for resistance, 1x10, 2x5. Rates moderate. PT provides TC to improve AROM/activation LLE.  Hooklying TrA contraction with deadbug, requiring VC/TC for proper sequencing/coordination 2x10 reps alternating. Pt with some difficulty with coordination today, takes increased time.  penguin crunch 10x alt   Mini-AROM hamstring  curls with green pball 2x10 BLE BLE press into ball 5x. Reports can feel hamstring activation.   Pt reports no pain with all interventions.   PATIENT EDUCATION:    Education details: Pt educated throughout session about proper posture and technique with exercises. Improved exercise technique, movement at target joints, use of target muscles after min to mod verbal, visual, tactile cues.  Person educated: Patient Education method: Explanation, Demonstration, Tactile cues, and Verbal cues Education comprehension: verbalized understanding, returned demonstration, verbal cues required, tactile cues required, and needs further education   HOME EXERCISE PROGRAM:No updates to HEP today. Pt to continue HEP as previously given  Access Code: [email or web address] URL: [email or web address] Date: 02/06/2022 Prepared by: Sande Brothers  Exercises - Supine Transversus Abdominis Bracing - Hands on Stomach  - 1 x daily - 7 x weekly - 3 sets - 10 reps - Supine Transversus Abdominis Bracing with Double Leg Fallout  - 1 x daily - 7 x weekly - 3 sets - 10 reps - Supine Hip Adduction Isometric with Ball  - 1 x daily - 7 x weekly - 3 sets - 10 reps   Access Code: [email or web address] URL: [email or web address] Date: 12/09/2021 Prepared by: Amalia Hailey  Exercises - Seated Alternating Side Stretch with Arm Overhead  - 1 x  daily - 7 x weekly - 2 sets - 10 reps - Seated Thoracic Extension Arms Overhead  - 1 x daily - 7 x weekly - 2 sets - 10 reps  GOALS: Goals reviewed with patient? Yes  SHORT TERM GOALS: Target date: 10/02/2021  Pt will be independent with initial HEP in order to improve strength and balance in order to decrease fall risk and improve function at home and work.  Baseline: 08/21/2021-No formal HEP in place Goal status: MET   LONG TERM GOALS: Target date: 02/05/2022  Pt will improve BERG by at least 3 points in order to demonstrate clinically significant improvement in  balance.   Baseline: 08/21/2021= 48/56 8/24:47/56; 11/13/2021= 47/56, 01/06/2022 = Unable to test due to time constraint- will test next session and update goal; 01/08/2022= 51/56- will keep goal active to ensure consistency. Goal status: ongoing  2.  Pt will be independent with final HEP in order to improve strength and balance in order to decrease fall risk and improve function at home and work.  Baseline: No HEP in place, 8/24: completing current HEP but still progressing; 11/13/2021- Patient continues to verbalize and demonstrate understanding of HEP for stretching/strengthening and mobility but still progressing and altering program as appropriate; 11/7: Continues follow current HEP but still progressing HEP as patient progresses  Goal status: IN PROGRESS  3.  Pt will decrease 5TSTS by at least 4 seconds in order to demonstrate clinically significant improvement in LE strength. Baseline: 08/21/2021= 19.01 sec without UE support 8/24:10.56 sec Goal status: MET  4.  Pt will decrease TUG to below 14 seconds/decrease in order to demonstrate decreased fall risk. Baseline:  08/21/2021=15.67 with RW 8/24: 12.41 Goal status: MET  5.  Pt will improve FOTO to target score of 56 to display perceived improvements in ability to complete ADL's.  Baseline: 08/21/2021=49 8/24: 45.6; 11/13/2021=46, 01/06/2022 = 45.6 Goal status: IN PROGRESS  6.  Pt will increase 10MWT by at least 0.15 m/s in order to demonstrate clinically significant improvement in community ambulation.   Baseline: 08/22/2021=0.94 m/s 8/24:1.13ms Goal status: MET  7 Pt will improve miniBESTest by at least 4 points to demonstrate clinically significant improvement in reduced falls with parkinsonian related deficits.  Baseline: 19/28; 11/13/2021- Unable to test due to time constraint- will test next session and update goal. 9/19: 18/28; 01/06/2022 - 23/28 Goal Status: MET  8. Pt will increase 6MWT by at least [id number] order to demonstrate  clinically significant improvement in cardiopulmonary endurance and community ambulation  Baseline: 11/13/2021= 650 feet with walker; 01/06/2022 = 1160 feet with walker.  Goal Status: MET   ASSESSMENT:  CLINICAL IMPRESSION:  Pt shows progress by performing increased reps of several interventions performed on mat table without significant fatigue and without pain. He generally rated interventions as moderate in difficulty. Pt reported no back pain at end of appointment. Pt most challenged today with coordination for deadbug exercise.  Pt will continue to benefit from skilled therapy to address remaining deficits in order to improve overall QoL and return to PLOF.      OBJECTIVE IMPAIRMENTS Abnormal gait, decreased activity tolerance, decreased balance, decreased coordination, decreased endurance, decreased knowledge of use of DME, decreased mobility, difficulty walking, decreased ROM, decreased strength, hypomobility, and impaired flexibility.   ACTIVITY LIMITATIONS carrying, lifting, bending, standing, squatting, sleeping, stairs, transfers, continence, bathing, toileting, and dressing  PARTICIPATION LIMITATIONS: cleaning, laundry, medication management, driving, shopping, community activity, and yard work  PERSONAL FACTORS Age are also affecting patient's functional  outcome.   REHAB POTENTIAL: Good  CLINICAL DECISION MAKING: Stable/uncomplicated  EVALUATION COMPLEXITY: Low  PLAN: PT FREQUENCY: 2x/week  PT DURATION: 12 weeks  PLANNED INTERVENTIONS: Therapeutic exercises, Therapeutic activity, Neuromuscular re-education, Balance training, Gait training, Patient/Family education, Joint mobilization, Stair training, Vestibular training, Canalith repositioning, DME instructions, Dry Needling, Electrical stimulation, Spinal manipulation, and Spinal mobilization  PLAN FOR NEXT SESSION:  balance as pt able, supine therex   Ricard Dillon PT, DPT  Physical Therapist- [facility]  02/09/22, 5:02 PM

## 2022-02-10 ENCOUNTER — Ambulatory Visit: Payer: Medicare HMO | Admitting: Physical Therapy

## 2022-02-10 ENCOUNTER — Ambulatory Visit: Payer: Medicare HMO | Attending: Pain Medicine | Admitting: Pain Medicine

## 2022-02-10 ENCOUNTER — Encounter: Payer: Medicare HMO | Admitting: Speech Pathology

## 2022-02-10 ENCOUNTER — Encounter: Payer: Self-pay | Admitting: Pain Medicine

## 2022-02-10 ENCOUNTER — Encounter: Payer: Medicare HMO | Admitting: Occupational Therapy

## 2022-02-10 VITALS — BP 81/64 | HR 68 | Temp 97.1°F | Resp 16 | Ht 67.0 in | Wt 130.0 lb

## 2022-02-10 DIAGNOSIS — M79604 Pain in right leg: Secondary | ICD-10-CM | POA: Diagnosis not present

## 2022-02-10 DIAGNOSIS — G8929 Other chronic pain: Secondary | ICD-10-CM | POA: Diagnosis not present

## 2022-02-10 DIAGNOSIS — G894 Chronic pain syndrome: Secondary | ICD-10-CM | POA: Diagnosis not present

## 2022-02-10 DIAGNOSIS — M545 Low back pain, unspecified: Secondary | ICD-10-CM | POA: Diagnosis not present

## 2022-02-10 DIAGNOSIS — M79605 Pain in left leg: Secondary | ICD-10-CM | POA: Diagnosis not present

## 2022-02-10 DIAGNOSIS — M5417 Radiculopathy, lumbosacral region: Secondary | ICD-10-CM | POA: Insufficient documentation

## 2022-02-10 NOTE — Progress Notes (Signed)
Safety precautions to be maintained throughout the outpatient stay will include: orient to surroundings, keep bed in low position, maintain call bell within reach at all times, provide assistance with transfer out of bed and ambulation.  

## 2022-02-10 NOTE — Patient Instructions (Signed)
Follow up as needed for flare ups.

## 2022-02-10 NOTE — Progress Notes (Signed)
PROVIDER NOTE: Information contained herein reflects review and annotations entered in association with encounter. Interpretation of such information and data should be left to medically-trained personnel. Information provided to patient can be located elsewhere in the medical record under "Patient Instructions". Document created using STT-dictation technology, any transcriptional errors that may result from process are unintentional.    Patient: Joseph Arroyo  Service Category: E/M  Provider: Gaspar Cola, MD  DOB: 1940/10/17  DOS: 02/10/2022  Referring Provider: Nobie Putnam *  MRN: [id number]  Specialty: Interventional Pain Management  PCP: Olin Hauser, DO  Type: Established Patient  Setting: Ambulatory outpatient    Location: Office  Delivery: Face-to-face     HPI  Joseph Arroyo, a 81 y.o. year old male, is here today because of his Chronic bilateral low back pain without sciatica [M54.50, G89.29]. Joseph Arroyo primary complain today is Back Pain Last encounter: My last encounter with him was on 01/27/2022. Pertinent problems: Joseph Arroyo has Chronic knee pain (Left); Derangement of medial meniscus, posterior horn (Left); PAD (peripheral artery disease) ([place]); Chronic pain syndrome; Grade 1 Retrolisthesis of L2/L3 (5 mm) and L3/L4 (3 mm); Levoscoliosis of lumbar spine (L3-4 apex); DDD (degenerative disc disease), lumbosacral; Lumbosacral facet arthropathy (Left: L3-4, L4-5, and L5-S1); Tricompartment osteoarthritis of knee (Left); Baker cyst (Left); Chronic low back pain (1ry area of Pain) (Bilateral) (R>L) w/o sciatica; Lumbar facet syndrome (Bilateral); Chronic lower extremity pain (2ry area of Pain) (Bilateral) (L>R); Lumbosacral radiculitis/sensory radiculopathy at L2 (Bilateral); Lumbosacral radiculitis/sensory radiculopathy at L3 (Bilateral); Abnormal MRI, lumbar spine (04/23/2021); Lumbar central spinal stenosis, w/o neurogenic claudication  (L4-5); Lumbar lateral recess stenosis (Bilateral: L2-3, L4-5) (Right: L3-4); Lumbosacral foraminal stenosis (Bilateral: L2-3, L3-4) (Left: L5-S1); Lumbar nerve root impingement (Right: L3 at L2-3 & L3-4); Ligamentum flavum hypertrophy (L3-4, L4-5); Spondylosis without myelopathy or radiculopathy, lumbosacral region; Cervicalgia; and Parkinson's disease with dyskinesia on their pertinent problem list. Pain Assessment: Severity of Chronic pain is reported as a 2 /10. Location: Back Lower/denies. Onset: More than a month ago. Quality: Constant, Discomfort, Stabbing. Timing: Constant. Modifying factor(s): meds, proc, heat. Vitals:  height is _0  (1.702 m) and weight is 130 lb (59 kg). His temperature is 97.1 F (36.2 C) (abnormal). His blood pressure is 81/64 (abnormal) and his pulse is 68. His respiration is 16 and oxygen saturation is 97%.  BMI: Estimated body mass index is 20.36 kg/m as calculated from the following:   Height as of this encounter: _1  (1.702 m).   Weight as of this encounter: 130 lb (59 kg).  Reason for encounter: post-procedure evaluation and assessment.  The patient indicates having attained an ongoing 50% improvement of his back pain.  Today we had a long conversation with regards to the fact that this is a chronic condition and he will continue to occasionally have flareups.  Whenever he has one, he has been encouraged to give [place] a call.  He understood and accepted.  Post-procedure evaluation   Type: Lumbar epidural steroid injection (LESI) (interlaminar) #2    Laterality: Right   Level:  L1-2 Level.  Imaging: Fluoroscopic guidance         Anesthesia: Local anesthesia (1-2% Lidocaine) Anxiolysis: IV Versed 1.5 mg Sedation: No Sedation                       DOS: 01/27/2022  Performed by: Gaspar Cola, MD  Purpose: Diagnostic/Therapeutic Indications: Lumbar radicular pain of intraspinal etiology of more than 4  weeks that has failed to respond to conservative  therapy and is severe enough to impact quality of life or function. 1. Chronic low back pain (1ry area of Pain) (Bilateral) (R>L) w/o sciatica   2. Chronic lower extremity pain (2ry area of Pain) (Bilateral) (L>R)   3. Grade 1 Retrolisthesis of L2/L3 (5 mm) and L3/L4 (3 mm)   4. Levoscoliosis of lumbar spine (L3-4 apex)   5. DDD (degenerative disc disease), lumbosacral   6. Lumbosacral radiculitis/sensory radiculopathy at L2 (Bilateral)   7. Abnormal MRI, lumbar spine (04/23/2021)   8. Parkinson's disease with dyskinesia, unspecified whether manifestations fluctuate    NAS-11 Pain score:   Pre-procedure: 6 /10   Post-procedure: 4 /10      Effectiveness:  Initial hour after procedure: 75 %. Subsequent 4-6 hours post-procedure: 75 %. Analgesia past initial 6 hours: 50 % (ongoing). Ongoing improvement:  Analgesic: The patient indicates having an ongoing 50% improvement of his back pain. Function: Joseph Arroyo reports improvement in function ROM: Joseph Arroyo reports improvement in ROM  Pharmacotherapy Assessment  Analgesic: None MME/day: 0 mg/day   Monitoring: [place] PMP: PDMP reviewed during this encounter.       Pharmacotherapy: No side-effects or adverse reactions reported. Compliance: No problems identified. Effectiveness: Clinically acceptable.  Dewayne Shorter, RN  02/10/2022  2:32 PM  Sign when Signing Visit Safety precautions to be maintained throughout the outpatient stay will include: orient to surroundings, keep bed in low position, maintain call bell within reach at all times, provide assistance with transfer out of bed and ambulation.    No results found for: "CBDTHCR" No results found for: "D8THCCBX" No results found for: "D9THCCBX"  UDS:  Summary  Date Value Ref Range Status  03/25/2021 Note  Final    Comment:    ==================================================================== Compliance Drug Analysis,  Ur ==================================================================== Test                             Result       Flag       Units  Drug Present and Declared for Prescription Verification   Gabapentin                     PRESENT      EXPECTED   Baclofen                       PRESENT      EXPECTED   Paroxetine                     PRESENT      EXPECTED   Ibuprofen                      PRESENT      EXPECTED  Drug Present not Declared for Prescription Verification   Acetaminophen                  PRESENT      UNEXPECTED  Drug Absent but Declared for Prescription Verification   Alprazolam                     Not Detected UNEXPECTED ng/mg creat ==================================================================== Test                      Result    Flag   Units      Ref Range  Creatinine              122              mg/dL      >=20 ==================================================================== Declared Medications:  The flagging and interpretation on this report are based on the  following declared medications.  Unexpected results may arise from  inaccuracies in the declared medications.   **Note: The testing scope of this panel includes these medications:   Alprazolam  Baclofen (Lioresal)  Gabapentin (Neurontin)  Paroxetine (Paxil)   **Note: The testing scope of this panel does not include small to  moderate amounts of these reported medications:   Ibuprofen (Advil)   **Note: The testing scope of this panel does not include the  following reported medications:   Betamethasone (Lotrisone)  Carbidopa (Sinemet)  Clotrimazole (Lotrisone)  Folic Acid  Levodopa (Sinemet)  Lutein  Magnesium  Omega-3 Fatty Acids  Pramipexole (Mirapex)  Supplement  Ubiquinone (CoQ10)  Vitamin B ==================================================================== For clinical consultation, please call [phone]. ====================================================================       ROS  Constitutional: Denies any fever or chills Gastrointestinal: No reported hemesis, hematochezia, vomiting, or acute GI distress Musculoskeletal: Denies any acute onset joint swelling, redness, loss of ROM, or weakness Neurological: No reported episodes of acute onset apraxia, aphasia, dysarthria, agnosia, amnesia, paralysis, loss of coordination, or loss of consciousness  Medication Review  ALPRAZolam, B Complex Vitamins, Capsicum-Garlic, [place], L-Methylfolate, Lutein, PARoxetine, Psyllium, Saw Palmetto (Serenoa repens), acetaminophen, baclofen, carbidopa-levodopa, cloNIDine, gabapentin, and pramipexole  History Review  Allergy: Joseph Arroyo is allergic to prednisone and wheat bran. Drug: Joseph Arroyo  reports no history of drug use. Alcohol:  reports no history of alcohol use. Tobacco:  reports that he has never smoked. He has never used smokeless tobacco. Social: Joseph Arroyo  reports that he has never smoked. He has never used smokeless tobacco. He reports that he does not drink alcohol and does not use drugs. Medical:  has a past medical history of Allergic rhinitis due to pollen (11/21/2007), Brachial neuritis or radiculitis NOS, Cervicalgia, Concussion, Costal chondritis, Depression, Essential and other specified forms of tremor, GERD (gastroesophageal reflux disease), Lumbago, Occlusion and stenosis of carotid artery without mention of cerebral infarction, and Seizures ([place]). Surgical: Joseph Arroyo  has a past surgical history that includes Other surgical history (2002); Colonoscopy (2008); Stapedes surgery (Right, 2002); Cataract extraction (Right, 07/18/14); and Cataract extraction w/PHACO (Left, 08/01/2014). Family: family history includes Asthma in his father; Heart failure in his mother.  Laboratory Chemistry Profile   Renal Lab Results  Component Value Date   BUN 20 10/10/2021    CREATININE 0.77 10/10/2021   BCR SEE NOTE: 10/10/2021   GFRAA 100 09/07/2019   GFRNONAA >60 03/24/2021    Hepatic Lab Results  Component Value Date   AST 16 10/10/2021   ALT 6 (L) 10/10/2021   ALBUMIN 4.1 03/24/2021   ALKPHOS 51 03/24/2021    Electrolytes Lab Results  Component Value Date   NA 133 (L) 10/10/2021   K 4.2 10/10/2021   CL 97 (L) 10/10/2021   CALCIUM 9.0 10/10/2021   MG 2.0 03/24/2021    Bone Lab Results  Component Value Date   VD25OH 42 10/10/2021   25OHVITD1 32 03/24/2021   25OHVITD2 <1.0 03/24/2021   25OHVITD3 31 03/24/2021    Inflammation (CRP: Acute Phase) (ESR: Chronic Phase) Lab Results  Component Value Date   CRP 0.5 03/24/2021   ESRSEDRATE 4 03/24/2021  Note: Above Lab results reviewed.  Recent Imaging Review  DG PAIN CLINIC C-ARM 1-60 MIN NO REPORT Fluoro was used, but no Radiologist interpretation will be provided.  Please refer to "NOTES" tab for provider progress note. Note: Reviewed        Physical Exam  General appearance: Well nourished, well developed, and well hydrated. In no apparent acute distress Mental status: Alert, oriented x 3 (person, place, & time)       Respiratory: No evidence of acute respiratory distress Eyes: PERLA Vitals: BP (!) 81/64 Comment: asymptomatic  Pulse 68   Temp (!) 97.1 F (36.2 C)   Resp 16   Ht _0  (1.702 m)   Wt 130 lb (59 kg)   SpO2 97%   BMI 20.36 kg/m  BMI: Estimated body mass index is 20.36 kg/m as calculated from the following:   Height as of this encounter: _1  (1.702 m).   Weight as of this encounter: 130 lb (59 kg). Ideal: Ideal body weight: 66.1 kg (145 lb 11.6 oz)  Assessment   Diagnosis Status  1. Chronic low back pain (1ry area of Pain) (Bilateral) (R>L) w/o sciatica   2. Chronic lower extremity pain (2ry area of Pain) (Bilateral) (L>R)   3. Lumbosacral radiculitis/sensory radiculopathy at L2 (Bilateral)   4. Chronic pain syndrome     Controlled Controlled Controlled   Updated Problems: No problems updated.  Plan of Care  Problem-specific:  No problem-specific Assessment & Plan notes found for this encounter.  Joseph Arroyo has a current medication list which includes the following long-term medication(s): clonidine, gabapentin, paroxetine, and paroxetine.  Pharmacotherapy (Medications Ordered): No orders of the defined types were placed in this encounter.  Orders:  No orders of the defined types were placed in this encounter.  Follow-up plan:   Return if symptoms worsen or fail to improve.     Interventional Therapies  Risk  Complexity Considerations:   Estimated body mass index is 20.98 kg/m as calculated from the following:   Height as of this encounter: _2  (1.727 m).   Weight as of this encounter: 138 lb (62.6 kg).    Levoscoliosis (L3-4 apex)  Parkinsons ([place] w/ dyskinesia)  SENSITIVITY: Oral Prednisone (Hyperactivity)   Planned  Pending:   Diagnostic right L1-2 LESI #2 (01/27/2022)    Under consideration:   Diagnostic bilateral L2-3 & L3-4 lumbar facet (L1-4) MBB #2  Diagnostic x-rays of the cervical spine for further evaluation of his cervicalgia.   Completed:   Diagnostic bilateral L2-3 & L3-4 Facet Blk (L1-4 MBB) x1 (75/75/75 x2 weeks)  Diagnostic bilateral lumbar facet (L2-S1) MBB x1 (07/08/2021) (50/50/50/<50)  Diagnostic right L1-2 LESI x1 (08/26/2021) (100/100/50/50)  Diagnostic right L2-3 LESI x1 (06/03/2021) (100/100/45/45)  Referral to physical therapy for LB PT as well as a back brace eval. (04/28/2021) (water aerobics)    Therapeutic  Palliative (PRN) options:   None established     Recent Visits Date Type Provider Dept  01/27/22 Procedure visit Milinda Pointer, MD [facility]  01/15/22 Office Visit Milinda Pointer, MD [facility]  12/23/21 Procedure visit Milinda Pointer, MD [facility]  12/10/21 Office Visit Milinda Pointer, MD [facility]  Showing recent visits within past 90 days and meeting all other requirements Today's Visits Date Type Provider Dept  02/10/22 Office Visit Milinda Pointer, MD [facility]  Showing today's visits and meeting all other requirements Future Appointments No visits were found meeting these conditions. Showing  future appointments within next 90 days and meeting all other requirements  I discussed the assessment and treatment plan with the patient. The patient was provided an opportunity to ask questions and all were answered. The patient agreed with the plan and demonstrated an understanding of the instructions.  Patient advised to call back or seek an in-person evaluation if the symptoms or condition worsens.  Duration of encounter: 35 minutes.  Total time on encounter, as per AMA guidelines included both the face-to-face and non-face-to-face time personally spent by the physician and/or other qualified health care professional(s) on the day of the encounter (includes time in activities that require the physician or other qualified health care professional and does not include time in activities normally performed by clinical staff). Physician's time may include the following activities when performed: preparing to see the patient (eg, review of tests, pre-charting review of records) obtaining and/or reviewing separately obtained history performing a medically appropriate examination and/or evaluation counseling and educating the patient/family/caregiver ordering medications, tests, or procedures referring and communicating with other health care professionals (when not separately reported) documenting clinical information in the electronic or other health record independently interpreting results (not separately reported) and communicating results to the patient/ family/caregiver care coordination (not separately reported)  Note by: Gaspar Cola, MD Date: 02/10/2022; Time: 3:20 PM

## 2022-02-12 ENCOUNTER — Encounter: Payer: Medicare HMO | Admitting: Speech Pathology

## 2022-02-12 ENCOUNTER — Encounter: Payer: Self-pay | Admitting: Physical Therapy

## 2022-02-12 ENCOUNTER — Ambulatory Visit: Payer: Medicare HMO | Admitting: Physical Therapy

## 2022-02-12 ENCOUNTER — Encounter: Payer: Medicare HMO | Admitting: Occupational Therapy

## 2022-02-12 DIAGNOSIS — R269 Unspecified abnormalities of gait and mobility: Secondary | ICD-10-CM

## 2022-02-12 DIAGNOSIS — M6281 Muscle weakness (generalized): Secondary | ICD-10-CM

## 2022-02-12 DIAGNOSIS — R262 Difficulty in walking, not elsewhere classified: Secondary | ICD-10-CM

## 2022-02-12 DIAGNOSIS — R278 Other lack of coordination: Secondary | ICD-10-CM | POA: Diagnosis not present

## 2022-02-12 DIAGNOSIS — R2689 Other abnormalities of gait and mobility: Secondary | ICD-10-CM | POA: Diagnosis not present

## 2022-02-12 DIAGNOSIS — R2681 Unsteadiness on feet: Secondary | ICD-10-CM

## 2022-02-12 DIAGNOSIS — G20C Parkinsonism, unspecified: Secondary | ICD-10-CM | POA: Diagnosis not present

## 2022-02-12 NOTE — Therapy (Signed)
OUTPATIENT PHYSICAL THERAPY NEURO TREATMENT Patient Name: Joseph Arroyo MRN: [id number] DOB:08-Oct-1940, 81 y.o., male Today's Date: 02/12/2022  PCP: Joseph Hauser, DO REFERRING PROVIDER: Lavena Bullion, NP   PT End of Session - 02/12/22 1427     Visit Number 38    Number of Visits 40    Date for PT Re-Evaluation 03/17/22    Authorization Type [employer]    Authorization Time Period 11/13/2021-02/05/2022    Progress Note Due on Visit 40    PT Start Time 1430    PT Stop Time 1514    PT Time Calculation (min) 44 min    Equipment Utilized During Treatment Gait belt    Activity Tolerance Patient tolerated treatment well;No increased pain    Behavior During Therapy [facility] for tasks assessed/performed                 Past Medical History:  Diagnosis Date   Allergic rhinitis due to pollen 11/21/2007   Brachial neuritis or radiculitis NOS    Cervicalgia    Concussion    age 75 - s/p accident   Costal chondritis    Depression    Essential and other specified forms of tremor    GERD (gastroesophageal reflux disease)    in past   Lumbago    Occlusion and stenosis of carotid artery without mention of cerebral infarction    Seizures [facility])    age 9 - after concussion   Past Surgical History:  Procedure Laterality Date   CATARACT EXTRACTION Right 07/18/14   CATARACT EXTRACTION W/PHACO Left 08/01/2014   Procedure: CATARACT EXTRACTION PHACO AND INTRAOCULAR LENS PLACEMENT (Joseph Arroyo);  Surgeon: Joseph Koyanagi, MD;  Location: [place];  Service: Ophthalmology;  Laterality: Left;  IVA TOPICAL   COLONOSCOPY  2008   OTHER SURGICAL HISTORY  2002   ear surgery   STAPEDES SURGERY Right 2002   Patient Active Problem List   Diagnosis Date Noted   Parkinson's disease with dyskinesia 01/27/2022   Dysphagia, pharyngeal phase 09/17/2021   Cervicalgia 08/05/2021   Spondylosis without myelopathy or radiculopathy, lumbosacral region 06/24/2021   Lumbar central  spinal stenosis, w/o neurogenic claudication (L4-5) 06/03/2021   Lumbar lateral recess stenosis (Bilateral: L2-3, L4-5) (Right: L3-4) 06/03/2021   Lumbosacral foraminal stenosis (Bilateral: L2-3, L3-4) (Left: L5-S1) 06/03/2021   Lumbar nerve root impingement (Right: L3 at L2-3 & L3-4) 06/03/2021   Ligamentum flavum hypertrophy (L3-4, L4-5) 06/03/2021   Abnormal MRI, lumbar spine (04/23/2021) 04/24/2021   Long term prescription benzodiazepine use (alprazolam) (Xanax) 03/24/2021   Grade 1 Retrolisthesis of L2/L3 (5 mm) and L3/L4 (3 mm) 03/24/2021   Levoscoliosis of lumbar spine (L3-4 apex) 03/24/2021   DDD (degenerative disc disease), lumbosacral 03/24/2021   Lumbosacral facet arthropathy (Left: L3-4, L4-5, and L5-S1) 03/24/2021   Tricompartment osteoarthritis of knee (Left) 03/24/2021   Baker cyst (Left) 03/24/2021   Chronic low back pain (1ry area of Pain) (Bilateral) (R>L) w/o sciatica 03/24/2021   Lumbar facet syndrome (Bilateral) 03/24/2021   Chronic lower extremity pain (2ry area of Pain) (Bilateral) (L>R) 03/24/2021   Lumbosacral radiculitis/sensory radiculopathy at L2 (Bilateral) 03/24/2021   Lumbosacral radiculitis/sensory radiculopathy at L3 (Bilateral) 03/24/2021   Chronic pain syndrome 03/23/2021   Pharmacologic therapy 03/23/2021   Disorder of skeletal system 03/23/2021   Problems influencing health status 03/23/2021   PAD (peripheral artery disease) ([place]) 10/01/2020   GAD (generalized anxiety disorder) 01/13/2018   MDD (major depressive disorder) 58/52/7782   Umbilical hernia 42/35/3614   Chronic  knee pain (Left) 08/11/2017   Derangement of medial meniscus, posterior horn (Left) 08/11/2017   Vitamin D insufficiency 03/05/2016   BPH without obstruction/lower urinary tract symptoms 07/08/2015   Chronic fatigue 10/23/2014   Major depression, recurrent, full remission ([place]) 07/26/2013   Unsteady gait 07/26/2013   Orthostatic hypotension 07/26/2013   Depression 07/26/2013    Anxiety 06/23/2013   Chronic anxiety 06/23/2013   Tremor 05/18/2013   Bilateral carotid artery stenosis 08/30/2012    ONSET DATE: 08/11/2021  REFERRING DIAG: Parkinsons Disease  THERAPY DIAG:  Muscle weakness (generalized)  Difficulty in walking, not elsewhere classified  Unsteadiness on feet  Abnormality of gait and mobility  Rationale for Evaluation and Treatment Rehabilitation  SUBJECTIVE: Pt reports some increase in back pain since last visit. Pt informed of lack of MHP as our machine is still out of order.     Pt accompanied by: spouse  PERTINENT HISTORY: Joseph Arroyo is a 89yoM who is referred to OPPT due to 2 falls in the setting of chronic parkinson's disease. Pt AMB with RW at baseline but is very physically active.    PAIN: Are you having pain? Yes: NPRS scale: 2/10 Pain location: low back Pain description: ache/soreness Aggravating factors: prolonged standing, transfers, walking Relieving factors: rest and medications   PATIENT GOALS  to walk better and be as active as possible. Also to get in/out of cars better.   OBJECTIVE:    Therex:  No moist heat this session as heater is broken.   Pt supported hooklye on plinth with large bolster under B knees to improve comfort in lower back region during all supine activities.   TrA contraction, 5 sec holds with tactile feedback utilized by pt by placing fingers over region of contraction 1x10 x 5 sec holds   Hooklying TrA contraction with 6" from mat- marches, 2x10 reps each LE. Pt provides TC to maintain ROM.  Supine PWR! Up x 10, cues for scapular muscle activation, unable to adequately contract muscles for full ROM, completed in seated as a result.   Hooklying TrA contraction with hip adduction into physioball, 20 with 5 sec holds/rep  Hooklying TrA contraction with B LE fallout x 10 ea LE   Seated scarular retractions, cues to limit trunk extension when attempting x 2 min   Pt reports no pain  with all interventions.   PATIENT EDUCATION:    Education details: Pt educated throughout session about proper posture and technique with exercises. Improved exercise technique, movement at target joints, use of target muscles after min to mod verbal, visual, tactile cues.  Person educated: Patient Education method: Explanation, Demonstration, Tactile cues, and Verbal cues Education comprehension: verbalized understanding, returned demonstration, verbal cues required, tactile cues required, and needs further education   HOME EXERCISE PROGRAM:No updates to HEP today. Pt to continue HEP as previously given  Access Code: [email or web address] URL: [email or web address] Date: 02/06/2022 Prepared by: Sande Brothers  Exercises - Supine Transversus Abdominis Bracing - Hands on Stomach  - 1 x daily - 7 x weekly - 3 sets - 10 reps - Supine Transversus Abdominis Bracing with Double Leg Fallout  - 1 x daily - 7 x weekly - 3 sets - 10 reps - Supine Hip Adduction Isometric with Ball  - 1 x daily - 7 x weekly - 3 sets - 10 reps   Access Code: [email or web address] URL: [email or web address] Date: 12/09/2021 Prepared by: Amalia Hailey  Exercises - Seated Alternating Side Stretch with Arm Overhead  - 1 x daily -  7 x weekly - 2 sets - 10 reps - Seated Thoracic Extension Arms Overhead  - 1 x daily - 7 x weekly - 2 sets - 10 reps  GOALS: Goals reviewed with patient? Yes  SHORT TERM GOALS: Target date: 10/02/2021  Pt will be independent with initial HEP in order to improve strength and balance in order to decrease fall risk and improve function at home and work.  Baseline: 08/21/2021-No formal HEP in place Goal status: MET   LONG TERM GOALS: Target date: 02/05/2022  Pt will improve BERG by at least 3 points in order to demonstrate clinically significant improvement in balance.   Baseline: 08/21/2021= 48/56 8/24:47/56; 11/13/2021= 47/56, 01/06/2022 = Unable to test due to time constraint-  will test next session and update goal; 01/08/2022= 51/56- will keep goal active to ensure consistency. Goal status: ongoing  2.  Pt will be independent with final HEP in order to improve strength and balance in order to decrease fall risk and improve function at home and work.  Baseline: No HEP in place, 8/24: completing current HEP but still progressing; 11/13/2021- Patient continues to verbalize and demonstrate understanding of HEP for stretching/strengthening and mobility but still progressing and altering program as appropriate; 11/7: Continues follow current HEP but still progressing HEP as patient progresses  Goal status: IN PROGRESS  3.  Pt will decrease 5TSTS by at least 4 seconds in order to demonstrate clinically significant improvement in LE strength. Baseline: 08/21/2021= 19.01 sec without UE support 8/24:10.56 sec Goal status: MET  4.  Pt will decrease TUG to below 14 seconds/decrease in order to demonstrate decreased fall risk. Baseline:  08/21/2021=15.67 with RW 8/24: 12.41 Goal status: MET  5.  Pt will improve FOTO to target score of 56 to display perceived improvements in ability to complete ADL's.  Baseline: 08/21/2021=49 8/24: 45.6; 11/13/2021=46, 01/06/2022 = 45.6 Goal status: IN PROGRESS  6.  Pt will increase 10MWT by at least 0.15 m/s in order to demonstrate clinically significant improvement in community ambulation.   Baseline: 08/22/2021=0.94 m/s 8/24:1.43ms Goal status: MET  7 Pt will improve miniBESTest by at least 4 points to demonstrate clinically significant improvement in reduced falls with parkinsonian related deficits.  Baseline: 19/28; 11/13/2021- Unable to test due to time constraint- will test next session and update goal. 9/19: 18/28; 01/06/2022 - 23/28 Goal Status: MET  8. Pt will increase 6MWT by at least [id number] order to demonstrate clinically significant improvement in cardiopulmonary endurance and community ambulation  Baseline: 11/13/2021= 650 feet  with walker; 01/06/2022 = 1160 feet with walker.  Goal Status: MET   ASSESSMENT:  CLINICAL IMPRESSION:  Continued with current plan of care as laid out in evaluation and recent prior sessions. Pt remains motivated to advance progress toward goals in order to maximize independence and safety at home. Pt requires high level assistance and cuing for completion of exercises in order to provide adequate level of stimulation challenge while minimizing pain and discomfort when possible. Pt closely monitored throughout session pt response and to maximize patient safety during interventions. Pt has difficulty with postural muscle activation with scapular retractions and tends to compensate with his LB muscles. Pt continues to demonstrate progress toward goals AEB progression of interventions this date either in volume or intensity.     OBJECTIVE IMPAIRMENTS Abnormal gait, decreased activity tolerance, decreased balance, decreased coordination, decreased endurance, decreased knowledge of use of DME, decreased mobility, difficulty walking, decreased ROM, decreased strength, hypomobility, and impaired flexibility.   ACTIVITY  LIMITATIONS carrying, lifting, bending, standing, squatting, sleeping, stairs, transfers, continence, bathing, toileting, and dressing  PARTICIPATION LIMITATIONS: cleaning, laundry, medication management, driving, shopping, community activity, and yard work  PERSONAL FACTORS Age are also affecting patient's functional outcome.   REHAB POTENTIAL: Good  CLINICAL DECISION MAKING: Stable/uncomplicated  EVALUATION COMPLEXITY: Low  PLAN: PT FREQUENCY: 2x/week  PT DURATION: 12 weeks  PLANNED INTERVENTIONS: Therapeutic exercises, Therapeutic activity, Neuromuscular re-education, Balance training, Gait training, Patient/Family education, Joint mobilization, Stair training, Vestibular training, Canalith repositioning, DME instructions, Dry Needling, Electrical stimulation, Spinal  manipulation, and Spinal mobilization  PLAN FOR NEXT SESSION:  balance as pt able, supine therex, core muscle activation   Particia Lather PT   Physical Therapist- [facility]  02/12/22, 2:27 PM

## 2022-02-17 ENCOUNTER — Encounter: Payer: Medicare HMO | Admitting: Occupational Therapy

## 2022-02-17 ENCOUNTER — Encounter: Payer: Self-pay | Admitting: Physical Therapy

## 2022-02-17 ENCOUNTER — Ambulatory Visit: Payer: Medicare HMO | Admitting: Physical Therapy

## 2022-02-17 ENCOUNTER — Encounter: Payer: Medicare HMO | Admitting: Speech Pathology

## 2022-02-17 DIAGNOSIS — R269 Unspecified abnormalities of gait and mobility: Secondary | ICD-10-CM

## 2022-02-17 DIAGNOSIS — M6281 Muscle weakness (generalized): Secondary | ICD-10-CM | POA: Diagnosis not present

## 2022-02-17 DIAGNOSIS — R262 Difficulty in walking, not elsewhere classified: Secondary | ICD-10-CM | POA: Diagnosis not present

## 2022-02-17 DIAGNOSIS — R2689 Other abnormalities of gait and mobility: Secondary | ICD-10-CM | POA: Diagnosis not present

## 2022-02-17 DIAGNOSIS — R278 Other lack of coordination: Secondary | ICD-10-CM | POA: Diagnosis not present

## 2022-02-17 DIAGNOSIS — R2681 Unsteadiness on feet: Secondary | ICD-10-CM

## 2022-02-17 DIAGNOSIS — G20C Parkinsonism, unspecified: Secondary | ICD-10-CM | POA: Diagnosis not present

## 2022-02-17 NOTE — Therapy (Signed)
OUTPATIENT PHYSICAL THERAPY NEURO TREATMENT Patient Name: Joseph Arroyo MRN: [id number] DOB:21-Apr-1940, 81 y.o., male Today's Date: 02/17/2022  PCP: Olin Hauser, DO REFERRING PROVIDER: Lavena Bullion, NP   PT End of Session - 02/17/22 1659     Visit Number 39    Number of Visits 40    Date for PT Re-Evaluation 03/17/22    Authorization Type [employer] Medicare    Authorization Time Period 11/13/2021-02/05/2022    Progress Note Due on Visit 40    PT Start Time 1425    PT Stop Time 1510    PT Time Calculation (min) 45 min    Equipment Utilized During Treatment Gait belt    Activity Tolerance Patient tolerated treatment well;No increased pain    Behavior During Therapy [facility] for tasks assessed/performed                  Past Medical History:  Diagnosis Date   Allergic rhinitis due to pollen 11/21/2007   Brachial neuritis or radiculitis NOS    Cervicalgia    Concussion    age 56 - s/p accident   Costal chondritis    Depression    Essential and other specified forms of tremor    GERD (gastroesophageal reflux disease)    in past   Lumbago    Occlusion and stenosis of carotid artery without mention of cerebral infarction    Seizures [name])    age 39 - after concussion   Past Surgical History:  Procedure Laterality Date   CATARACT EXTRACTION Right 07/18/14   CATARACT EXTRACTION W/PHACO Left 08/01/2014   Procedure: CATARACT EXTRACTION PHACO AND INTRAOCULAR LENS PLACEMENT ([place]);  Surgeon: Leandrew Koyanagi, MD;  Location: [place];  Service: Ophthalmology;  Laterality: Left;  IVA TOPICAL   COLONOSCOPY  2008   OTHER SURGICAL HISTORY  2002   ear surgery   STAPEDES SURGERY Right 2002   Patient Active Problem List   Diagnosis Date Noted   Parkinson's disease with dyskinesia 01/27/2022   Dysphagia, pharyngeal phase 09/17/2021   Cervicalgia 08/05/2021   Spondylosis without myelopathy or radiculopathy, lumbosacral region 06/24/2021   Lumbar  central spinal stenosis, w/o neurogenic claudication (L4-5) 06/03/2021   Lumbar lateral recess stenosis (Bilateral: L2-3, L4-5) (Right: L3-4) 06/03/2021   Lumbosacral foraminal stenosis (Bilateral: L2-3, L3-4) (Left: L5-S1) 06/03/2021   Lumbar nerve root impingement (Right: L3 at L2-3 & L3-4) 06/03/2021   Ligamentum flavum hypertrophy (L3-4, L4-5) 06/03/2021   Abnormal MRI, lumbar spine (04/23/2021) 04/24/2021   Long term prescription benzodiazepine use (alprazolam) (Xanax) 03/24/2021   Grade 1 Retrolisthesis of L2/L3 (5 mm) and L3/L4 (3 mm) 03/24/2021   Levoscoliosis of lumbar spine (L3-4 apex) 03/24/2021   DDD (degenerative disc disease), lumbosacral 03/24/2021   Lumbosacral facet arthropathy (Left: L3-4, L4-5, and L5-S1) 03/24/2021   Tricompartment osteoarthritis of knee (Left) 03/24/2021   Baker cyst (Left) 03/24/2021   Chronic low back pain (1ry area of Pain) (Bilateral) (R>L) w/o sciatica 03/24/2021   Lumbar facet syndrome (Bilateral) 03/24/2021   Chronic lower extremity pain (2ry area of Pain) (Bilateral) (L>R) 03/24/2021   Lumbosacral radiculitis/sensory radiculopathy at L2 (Bilateral) 03/24/2021   Lumbosacral radiculitis/sensory radiculopathy at L3 (Bilateral) 03/24/2021   Chronic pain syndrome 03/23/2021   Pharmacologic therapy 03/23/2021   Disorder of skeletal system 03/23/2021   Problems influencing health status 03/23/2021   PAD (peripheral artery disease) ([place]) 10/01/2020   GAD (generalized anxiety disorder) 01/13/2018   MDD (major depressive disorder) 88/89/1694   Umbilical hernia 50/38/8828  Chronic knee pain (Left) 08/11/2017   Derangement of medial meniscus, posterior horn (Left) 08/11/2017   Vitamin D insufficiency 03/05/2016   BPH without obstruction/lower urinary tract symptoms 07/08/2015   Chronic fatigue 10/23/2014   Major depression, recurrent, full remission ([place]) 07/26/2013   Unsteady gait 07/26/2013   Orthostatic hypotension 07/26/2013   Depression  07/26/2013   Anxiety 06/23/2013   Chronic anxiety 06/23/2013   Tremor 05/18/2013   Bilateral carotid artery stenosis 08/30/2012    ONSET DATE: 08/11/2021  REFERRING DIAG: Parkinsons Disease  THERAPY DIAG:  Difficulty in walking, not elsewhere classified  Unsteadiness on feet  Abnormality of gait and mobility  Rationale for Evaluation and Treatment Rehabilitation  SUBJECTIVE: Pt reports similar back pain to last session. Has been working diligently on HEP.     Pt accompanied by: spouse  PERTINENT HISTORY: Joseph Arroyo is a 73yoM who is referred to OPPT due to 2 falls in the setting of chronic parkinson's disease. Pt AMB with RW at baseline but is very physically active.    PAIN: Are you having pain? Yes: NPRS scale: 2/10 Pain location: low back Pain description: ache/soreness Aggravating factors: prolonged standing, transfers, walking Relieving factors: rest and medications   PATIENT GOALS  to walk better and be as active as possible. Also to get in/out of cars better.   OBJECTIVE  Therex:  No moist heat this session as heater is broken.   Pt supported hooklye on plinth with large bolster under B knees to improve comfort in lower back region during all supine activities.   TrA contraction, 5 sec holds with tactile feedback utilized by pt by placing fingers over region of contraction 1 x 15 x 5 sec holds   Hooklying TrA contraction with 6" from mat- marches, x 10 reps each LE. Pt provides TC to maintain ROM.  Prone PRW! Step 1 LE at a time with TrA contraction   3*10 seated row with scapular retraction  -cues for keeping proper posture to prevent extension compensatory movement   STS with TrA contraction 2 x 10  -cues for head over toes for forward weight shift  Seated thoracic extension with 1/2 foam roller behind back.  -cues for low back to remain against back of chair to prevent copmensation from this area  -2 x 10   NMR Standing SLS progression:  30 second unilateral UE suipport, 10 seconds no UE support x 2 reps ea LE   Pt reports no pain with all interventions.   PATIENT EDUCATION:    Education details: Pt educated throughout session about proper posture and technique with exercises. Improved exercise technique, movement at target joints, use of target muscles after min to mod verbal, visual, tactile cues.  Person educated: Patient Education method: Explanation, Demonstration, Tactile cues, and Verbal cues Education comprehension: verbalized understanding, returned demonstration, verbal cues required, tactile cues required, and needs further education   HOME EXERCISE PROGRAM:No updates to HEP today. Pt to continue HEP as previously given  Access Code: [email or web address] URL: [email or web address] Date: 02/06/2022 Prepared by: Sande Brothers  Exercises - Supine Transversus Abdominis Bracing - Hands on Stomach  - 1 x daily - 7 x weekly - 3 sets - 10 reps - Supine Transversus Abdominis Bracing with Double Leg Fallout  - 1 x daily - 7 x weekly - 3 sets - 10 reps - Supine Hip Adduction Isometric with Ball  - 1 x daily - 7 x weekly - 3 sets - 10 reps   Access Code: [email or web address] URL:  [email or web address] Date: 12/09/2021 Prepared by: Amalia Hailey  Exercises - Seated Alternating Side Stretch with Arm Overhead  - 1 x daily - 7 x weekly - 2 sets - 10 reps - Seated Thoracic Extension Arms Overhead  - 1 x daily - 7 x weekly - 2 sets - 10 reps  GOALS: Goals reviewed with patient? Yes  SHORT TERM GOALS: Target date: 10/02/2021  Pt will be independent with initial HEP in order to improve strength and balance in order to decrease fall risk and improve function at home and work.  Baseline: 08/21/2021-No formal HEP in place Goal status: MET   LONG TERM GOALS: Target date: 02/05/2022  Pt will improve BERG by at least 3 points in order to demonstrate clinically significant improvement in balance.   Baseline:  08/21/2021= 48/56 8/24:47/56; 11/13/2021= 47/56, 01/06/2022 = Unable to test due to time constraint- will test next session and update goal; 01/08/2022= 51/56- will keep goal active to ensure consistency. Goal status: ongoing  2.  Pt will be independent with final HEP in order to improve strength and balance in order to decrease fall risk and improve function at home and work.  Baseline: No HEP in place, 8/24: completing current HEP but still progressing; 11/13/2021- Patient continues to verbalize and demonstrate understanding of HEP for stretching/strengthening and mobility but still progressing and altering program as appropriate; 11/7: Continues follow current HEP but still progressing HEP as patient progresses  Goal status: IN PROGRESS  3.  Pt will decrease 5TSTS by at least 4 seconds in order to demonstrate clinically significant improvement in LE strength. Baseline: 08/21/2021= 19.01 sec without UE support 8/24:10.56 sec Goal status: MET  4.  Pt will decrease TUG to below 14 seconds/decrease in order to demonstrate decreased fall risk. Baseline:  08/21/2021=15.67 with RW 8/24: 12.41 Goal status: MET  5.  Pt will improve FOTO to target score of 56 to display perceived improvements in ability to complete ADL's.  Baseline: 08/21/2021=49 8/24: 45.6; 11/13/2021=46, 01/06/2022 = 45.6 Goal status: IN PROGRESS  6.  Pt will increase 10MWT by at least 0.15 m/s in order to demonstrate clinically significant improvement in community ambulation.   Baseline: 08/22/2021=0.94 m/s 8/24:1.46ms Goal status: MET  7 Pt will improve miniBESTest by at least 4 points to demonstrate clinically significant improvement in reduced falls with parkinsonian related deficits.  Baseline: 19/28; 11/13/2021- Unable to test due to time constraint- will test next session and update goal. 9/19: 18/28; 01/06/2022 - 23/28 Goal Status: MET  8. Pt will increase 6MWT by at least [id number] order to demonstrate clinically significant  improvement in cardiopulmonary endurance and community ambulation  Baseline: 11/13/2021= 650 feet with walker; 01/06/2022 = 1160 feet with walker.  Goal Status: MET   ASSESSMENT:  CLINICAL IMPRESSION:  Continued with current plan of care as laid out in evaluation and recent prior sessions. Pt remains motivated to advance progress toward goals in order to maximize independence and safety at home. Pt requires high level assistance and cuing for completion of exercises in order to provide adequate level of stimulation challenge while minimizing pain and discomfort when possible. Pt progressed with scapular muscle activation with improved response and also progressed with thoracic mobility. Pt continues to demonstrate progress toward goals AEB progression of interventions this date either in volume or intensity.     OBJECTIVE IMPAIRMENTS Abnormal gait, decreased activity tolerance, decreased balance, decreased coordination, decreased endurance, decreased knowledge of use of DME, decreased mobility, difficulty walking, decreased ROM, decreased strength, hypomobility,  and impaired flexibility.   ACTIVITY LIMITATIONS carrying, lifting, bending, standing, squatting, sleeping, stairs, transfers, continence, bathing, toileting, and dressing  PARTICIPATION LIMITATIONS: cleaning, laundry, medication management, driving, shopping, community activity, and yard work  PERSONAL FACTORS Age are also affecting patient's functional outcome.   REHAB POTENTIAL: Good  CLINICAL DECISION MAKING: Stable/uncomplicated  EVALUATION COMPLEXITY: Low  PLAN: PT FREQUENCY: 2x/week  PT DURATION: 12 weeks  PLANNED INTERVENTIONS: Therapeutic exercises, Therapeutic activity, Neuromuscular re-education, Balance training, Gait training, Patient/Family education, Joint mobilization, Stair training, Vestibular training, Canalith repositioning, DME instructions, Dry Needling, Electrical stimulation, Spinal manipulation, and  Spinal mobilization  PLAN FOR NEXT SESSION:  balance as pt able, supine therex, core muscle activation  Particia Lather PT Physical Therapist- [facility]  02/17/22, 5:04 PM

## 2022-02-19 ENCOUNTER — Ambulatory Visit: Payer: Medicare HMO | Admitting: Physical Therapy

## 2022-02-19 ENCOUNTER — Encounter: Payer: Medicare HMO | Admitting: Occupational Therapy

## 2022-02-19 ENCOUNTER — Encounter: Payer: Medicare HMO | Admitting: Speech Pathology

## 2022-02-19 DIAGNOSIS — R2689 Other abnormalities of gait and mobility: Secondary | ICD-10-CM

## 2022-02-19 DIAGNOSIS — R2681 Unsteadiness on feet: Secondary | ICD-10-CM | POA: Diagnosis not present

## 2022-02-19 DIAGNOSIS — G20C Parkinsonism, unspecified: Secondary | ICD-10-CM | POA: Diagnosis not present

## 2022-02-19 DIAGNOSIS — M6281 Muscle weakness (generalized): Secondary | ICD-10-CM | POA: Diagnosis not present

## 2022-02-19 DIAGNOSIS — R262 Difficulty in walking, not elsewhere classified: Secondary | ICD-10-CM

## 2022-02-19 DIAGNOSIS — R278 Other lack of coordination: Secondary | ICD-10-CM | POA: Diagnosis not present

## 2022-02-19 DIAGNOSIS — R269 Unspecified abnormalities of gait and mobility: Secondary | ICD-10-CM | POA: Diagnosis not present

## 2022-02-19 NOTE — Therapy (Signed)
OUTPATIENT PHYSICAL THERAPY NEURO TREATMENT/ Physical Therapy Progress Note/ recert    Dates of reporting period  12/18/21   to   02/20/22  Patient Name: Joseph Arroyo MRN: [id number] DOB:[birth year], 81 y.o., male Today's Date: 02/20/2022  PCP: Joseph Hauser, DO REFERRING PROVIDER: Lavena Bullion, NP   PT End of Session - 02/19/22 1433     Visit Number 40    Number of Visits 72    Date for PT Re-Evaluation 04/16/22    Authorization Type [employer]    Authorization Time Period 11/13/2021-02/05/2022    Progress Note Due on Visit 67    PT Start Time 4982    PT Stop Time 1508    PT Time Calculation (min) 46 min    Equipment Utilized During Treatment Gait belt    Activity Tolerance Patient tolerated treatment well;No increased pain    Behavior During Therapy [facility] for tasks assessed/performed                    Past Medical History:  Diagnosis Date   Allergic rhinitis due to pollen 11/21/2007   Brachial neuritis or radiculitis NOS    Cervicalgia    Concussion    age 3 - s/p accident   Costal chondritis    Depression    Essential and other specified forms of tremor    GERD (gastroesophageal reflux disease)    in past   Lumbago    Occlusion and stenosis of carotid artery without mention of cerebral infarction    Seizures [facility])    age 82 - after concussion   Past Surgical History:  Procedure Laterality Date   CATARACT EXTRACTION Right 07/18/14   CATARACT EXTRACTION W/PHACO Left 08/01/2014   Procedure: CATARACT EXTRACTION PHACO AND INTRAOCULAR LENS PLACEMENT ([place]);  Surgeon: Leandrew Koyanagi, MD;  Location: [place];  Service: Ophthalmology;  Laterality: Left;  IVA TOPICAL   COLONOSCOPY  2008   OTHER SURGICAL HISTORY  2002   ear surgery   STAPEDES SURGERY Right 2002   Patient Active Problem List   Diagnosis Date Noted   Parkinson's disease with dyskinesia 01/27/2022   Dysphagia, pharyngeal phase 09/17/2021   Cervicalgia  08/05/2021   Spondylosis without myelopathy or radiculopathy, lumbosacral region 06/24/2021   Lumbar central spinal stenosis, w/o neurogenic claudication (L4-5) 06/03/2021   Lumbar lateral recess stenosis (Bilateral: L2-3, L4-5) (Right: L3-4) 06/03/2021   Lumbosacral foraminal stenosis (Bilateral: L2-3, L3-4) (Left: L5-S1) 06/03/2021   Lumbar nerve root impingement (Right: L3 at L2-3 & L3-4) 06/03/2021   Ligamentum flavum hypertrophy (L3-4, L4-5) 06/03/2021   Abnormal MRI, lumbar spine (04/23/2021) 04/24/2021   Long term prescription benzodiazepine use (alprazolam) (Xanax) 03/24/2021   Grade 1 Retrolisthesis of L2/L3 (5 mm) and L3/L4 (3 mm) 03/24/2021   Levoscoliosis of lumbar spine (L3-4 apex) 03/24/2021   DDD (degenerative disc disease), lumbosacral 03/24/2021   Lumbosacral facet arthropathy (Left: L3-4, L4-5, and L5-S1) 03/24/2021   Tricompartment osteoarthritis of knee (Left) 03/24/2021   Baker cyst (Left) 03/24/2021   Chronic low back pain (1ry area of Pain) (Bilateral) (R>L) w/o sciatica 03/24/2021   Lumbar facet syndrome (Bilateral) 03/24/2021   Chronic lower extremity pain (2ry area of Pain) (Bilateral) (L>R) 03/24/2021   Lumbosacral radiculitis/sensory radiculopathy at L2 (Bilateral) 03/24/2021   Lumbosacral radiculitis/sensory radiculopathy at L3 (Bilateral) 03/24/2021   Chronic pain syndrome 03/23/2021   Pharmacologic therapy 03/23/2021   Disorder of skeletal system 03/23/2021   Problems influencing health status 03/23/2021   PAD (peripheral artery disease) (  [place]) 10/01/2020   GAD (generalized anxiety disorder) 01/13/2018   MDD (major depressive disorder) 60/45/4098   Umbilical hernia 11/91/4782   Chronic knee pain (Left) 08/11/2017   Derangement of medial meniscus, posterior horn (Left) 08/11/2017   Vitamin D insufficiency 03/05/2016   BPH without obstruction/lower urinary tract symptoms 07/08/2015   Chronic fatigue 10/23/2014   Major depression, recurrent, full remission  ([place]) 07/26/2013   Unsteady gait 07/26/2013   Orthostatic hypotension 07/26/2013   Depression 07/26/2013   Anxiety 06/23/2013   Chronic anxiety 06/23/2013   Tremor 05/18/2013   Bilateral carotid artery stenosis 08/30/2012    ONSET DATE: 08/11/2021  REFERRING DIAG: Parkinsons Disease  THERAPY DIAG:  Difficulty in walking, not elsewhere classified  Unsteadiness on feet  Abnormality of gait and mobility  Other abnormalities of gait and mobility  Rationale for Evaluation and Treatment Rehabilitation  SUBJECTIVE: Pt reports similar back pain to last session. Has been working diligently on HEP.   Pt accompanied by: spouse  PERTINENT HISTORY: Joseph Arroyo is a 14yoM who is referred to OPPT due to 2 falls in the setting of chronic parkinson's disease. Pt AMB with RW at baseline but is very physically active.     PAIN: Are you having pain? Yes: NPRS scale: 4/10 Pain location: low back Pain description: ache/soreness Aggravating factors: prolonged standing, transfers, walking Relieving factors: rest and medications   PATIENT GOALS  to walk better and be as active as possible. Also to get in/out of cars better.   OBJECTIVE Low back strength testing  Unable to hold legs in extension without support   Decreased lumbar lordosis, severe scoliosis from palpation   Abdominal/ core strength testing, unable to sustain B hip flexion ( immediate drop of both legs when PT released)    [facility] PT Assessment - 02/20/22 0001       Berg Balance Test   Sit to Stand Able to stand without using hands and stabilize independently    Standing Unsupported Able to stand safely 2 minutes    Sitting with Back Unsupported but Feet Supported on Floor or Stool Able to sit safely and securely 2 minutes    Stand to Sit Sits safely with minimal use of hands    Transfers Able to transfer safely, minor use of hands    Standing Unsupported with Eyes Closed Able to stand 10 seconds safely     Standing Unsupported with Feet Together Able to place feet together independently and stand 1 minute safely    From Standing, Reach Forward with Outstretched Arm Can reach confidently >25 cm (10")    From Standing Position, Pick up Object from Floor Able to pick up shoe safely and easily    From Standing Position, Turn to Look Behind Over each Shoulder Looks behind one side only/other side shows less weight shift    Turn 360 Degrees Able to turn 360 degrees safely in 4 seconds or less    Standing Unsupported, Alternately Place Feet on Step/Stool Able to stand independently and safely and complete 8 steps in 20 seconds    Standing Unsupported, One Foot in Front Able to plae foot ahead of the other independently and hold 30 seconds    Standing on One Leg Able to lift leg independently and hold equal to or more than 3 seconds    Total Score 52               Therex:  No moist heat this session as heater is broken.  Pt supported hooklye on plinth with large bolster under B knees to improve comfort in lower back region during all supine activities.   TrA contraction, 5 sec holds with tactile feedback utilized by pt by placing fingers over region of contraction 1 x 15 x 5 sec holds     PATIENT EDUCATION:    Education details: Pt educated throughout session about proper posture and technique with exercises. Improved exercise technique, movement at target joints, use of target muscles after min to mod verbal, visual, tactile cues.  Person educated: Patient Education method: Explanation, Demonstration, Tactile cues, and Verbal cues Education comprehension: verbalized understanding, returned demonstration, verbal cues required, tactile cues required, and needs further education   HOME EXERCISE PROGRAM:No updates to HEP today. Pt to continue HEP as previously given  Access Code: [email or web address] URL: [email or web address] Date: 02/06/2022 Prepared by: Sande Brothers  Exercises - Supine Transversus Abdominis Bracing - Hands on Stomach  - 1 x daily - 7 x weekly - 3 sets - 10 reps - Supine Transversus Abdominis Bracing with Double Leg Fallout  - 1 x daily - 7 x weekly - 3 sets - 10 reps - Supine Hip Adduction Isometric with Ball  - 1 x daily - 7 x weekly - 3 sets - 10 reps   Access Code: [email or web address] URL: [email or web address] Date: 12/09/2021 Prepared by: Amalia Hailey  Exercises - Seated Alternating Side Stretch with Arm Overhead  - 1 x daily - 7 x weekly - 2 sets - 10 reps - Seated Thoracic Extension Arms Overhead  - 1 x daily - 7 x weekly - 2 sets - 10 reps  GOALS: Goals reviewed with patient? Yes  SHORT TERM GOALS: Target date: MET   Pt will be independent with initial HEP in order to improve strength and balance in order to decrease fall risk and improve function at home and work.  Baseline: 08/21/2021-No formal HEP in place Goal status: MET   LONG TERM GOALS: Target date: 04/17/2022    Pt will improve BERG by at least 3 points in order to demonstrate clinically significant improvement in balance.   Baseline: 08/21/2021= 48/56 8/24:47/56; 11/13/2021= 47/56, 01/06/2022 = Unable to test due to time constraint- will test next session and update goal; 01/08/2022= 51/56- will keep goal active to ensure consistency.  12/21: 52/56 Goal status: Met  2.  Pt will be independent with final HEP in order to improve strength and balance in order to decrease fall risk and improve function at home and work.  Baseline: No HEP in place, 8/24: completing current HEP but still progressing; 11/13/2021- Patient continues to verbalize and demonstrate understanding of HEP for stretching/strengthening and mobility but still progressing and altering program as appropriate; 11/7: Continues follow current HEP but still progressing HEP as patient progresses 12/21: Been walking and completing core activation exercising, not doing as much balance lately.   Goal status: IN PROGRESS  3.  Pt will decrease 5TSTS by at least 4 seconds in order to demonstrate clinically significant improvement in LE strength. Baseline: 08/21/2021= 19.01 sec without UE support 8/24:10.56 sec Goal status: MET  4.  Pt will decrease TUG to below 14 seconds/decrease in order to demonstrate decreased fall risk. Baseline:  08/21/2021=15.67 with RW 8/24: 12.41 Goal status: MET  5.  Pt will improve FOTO to target score of 56 to display perceived improvements in ability to complete ADL's.  Baseline: 08/21/2021=49 8/24: 45.6; 11/13/2021=46, 01/06/2022 = 45.6 12/21: 46 Goal status: IN PROGRESS  6.  Pt will  increase 10MWT by at least 0.15 m/s in order to demonstrate clinically significant improvement in community ambulation.   Baseline: 08/22/2021=0.94 m/s 8/24:1.7ms Goal status: MET  7 Pt will improve miniBESTest by at least 4 points to demonstrate clinically significant improvement in reduced falls with parkinsonian related deficits.  Baseline: 19/28; 11/13/2021- Unable to test due to time constraint- will test next session and update goal. 9/19: 18/28; 01/06/2022 - 23/28 Goal Status: MET   8. Pt will increase 6MWT by at least [id number] order to demonstrate clinically significant improvement in cardiopulmonary endurance and community ambulation  Baseline: 11/13/2021= 650 feet with walker; 01/06/2022 = 1160 feet with walker.  Goal Status: MET   9.  Patient will improve modified Oswestry disability index by 10% or greater indicating improved functional capacity secondary to low back pain Baseline: 2 test next visit Goal status: INITIAL  10.  Patient will demonstrate improved core stabilization and hip strength as evidenced by ability to maintain bilateral hip flexion with control lowering to plinth with exacerbation of pain Baseline: Able to complete upon reeval, legs immediately dropped due to weakness. Goal status: INITIAL    ASSESSMENT:  CLINICAL  IMPRESSION:  Patient presents for progress note and consequential reevaluation this date.  Patient presents with significant deficits and core strength as evidenced by inability to sustain bilateral hip flexion without support.  Patient also has continued pain in his lumbar spine.  Patient has been dealing with lumbar spine pain chronically but it has not been treated or assessed. Low back pain has been very limiting for patient at home and with community tasks. Treatment in future session will focus on establishing long term HEP for balance and working on core stabilization so pt can perform more functional activities without consequential significant low back pain. Pt has made much progress with his balance and endurance and pt has reached potential with this. New goals will be established for low back related pain and weakness. Patient's condition has the potential to improve in response to therapy. Maximum improvement is yet to be obtained. The anticipated improvement is attainable and reasonable in a generally predictable time.  Pt will continue to benefit from skilled physical therapy intervention to address impairments, improve QOL, and attain therapy goals.      OBJECTIVE IMPAIRMENTS Abnormal gait, decreased activity tolerance, decreased balance, decreased coordination, decreased endurance, decreased knowledge of use of DME, decreased mobility, difficulty walking, decreased ROM, decreased strength, hypomobility, and impaired flexibility.   ACTIVITY LIMITATIONS carrying, lifting, bending, standing, squatting, sleeping, stairs, transfers, continence, bathing, toileting, and dressing  PARTICIPATION LIMITATIONS: cleaning, laundry, medication management, driving, shopping, community activity, and yard work  PERSONAL FACTORS Age are also affecting patient's functional outcome.   REHAB POTENTIAL: Good  CLINICAL DECISION MAKING: Stable/uncomplicated  EVALUATION COMPLEXITY: Low  PLAN: PT  FREQUENCY: 2x/week  PT DURATION: 12 weeks  PLANNED INTERVENTIONS: Therapeutic exercises, Therapeutic activity, Neuromuscular re-education, Balance training, Gait training, Patient/Family education, Joint mobilization, Stair training, Vestibular training, Canalith repositioning, DME instructions, Dry Needling, Electrical stimulation, Spinal manipulation, and Spinal mobilization  PLAN FOR NEXT SESSION: Establish finalized balance home exercise program and ensure patient completes correctly without exacerbation of back pain, complete modified Oswestry disability index and input into goals, begin targeted therapy for low back pain including lumbar and core stabilization exercises and hip strengthening.  [facility]2/22/23, 11:31 AM

## 2022-02-20 ENCOUNTER — Encounter: Payer: Self-pay | Admitting: Physical Therapy

## 2022-02-24 ENCOUNTER — Encounter: Payer: Self-pay | Admitting: Physical Therapy

## 2022-02-24 ENCOUNTER — Encounter: Payer: Medicare HMO | Admitting: Occupational Therapy

## 2022-02-24 ENCOUNTER — Encounter: Payer: Medicare HMO | Admitting: Speech Pathology

## 2022-02-24 ENCOUNTER — Ambulatory Visit: Payer: Medicare HMO | Admitting: Physical Therapy

## 2022-02-24 DIAGNOSIS — R269 Unspecified abnormalities of gait and mobility: Secondary | ICD-10-CM

## 2022-02-24 DIAGNOSIS — M6281 Muscle weakness (generalized): Secondary | ICD-10-CM

## 2022-02-24 DIAGNOSIS — G20C Parkinsonism, unspecified: Secondary | ICD-10-CM | POA: Diagnosis not present

## 2022-02-24 DIAGNOSIS — R278 Other lack of coordination: Secondary | ICD-10-CM | POA: Diagnosis not present

## 2022-02-24 DIAGNOSIS — R262 Difficulty in walking, not elsewhere classified: Secondary | ICD-10-CM | POA: Diagnosis not present

## 2022-02-24 DIAGNOSIS — R2681 Unsteadiness on feet: Secondary | ICD-10-CM

## 2022-02-24 DIAGNOSIS — R2689 Other abnormalities of gait and mobility: Secondary | ICD-10-CM | POA: Diagnosis not present

## 2022-02-24 NOTE — Therapy (Signed)
OUTPATIENT PHYSICAL THERAPY NEURO TREATMENT Patient Name: Joseph Arroyo MRN: [id number] DOB:10/14/1940, 81 y.o., male Today's Date: 02/24/2022  PCP: Olin Hauser, DO REFERRING PROVIDER: Lavena Bullion, NP   PT End of Session - 02/24/22 1420     Visit Number 41    Number of Visits 89    Date for PT Re-Evaluation 04/16/22    Authorization Type [employer]    Authorization Time Period 11/13/2021-02/05/2022    Progress Note Due on Visit 84    PT Start Time 1425    PT Stop Time 1510    PT Time Calculation (min) 45 min    Equipment Utilized During Treatment Gait belt    Activity Tolerance Patient tolerated treatment well;No increased pain    Behavior During Therapy [facility] for tasks assessed/performed                     Past Medical History:  Diagnosis Date   Allergic rhinitis due to pollen 11/21/2007   Brachial neuritis or radiculitis NOS    Cervicalgia    Concussion    age 31 - s/p accident   Costal chondritis    Depression    Essential and other specified forms of tremor    GERD (gastroesophageal reflux disease)    in past   Lumbago    Occlusion and stenosis of carotid artery without mention of cerebral infarction    Seizures [facility])    age 27 - after concussion   Past Surgical History:  Procedure Laterality Date   CATARACT EXTRACTION Right 07/18/14   CATARACT EXTRACTION W/PHACO Left 08/01/2014   Procedure: CATARACT EXTRACTION PHACO AND INTRAOCULAR LENS PLACEMENT ([place]);  Surgeon: Leandrew Koyanagi, MD;  Location: [place];  Service: Ophthalmology;  Laterality: Left;  IVA TOPICAL   COLONOSCOPY  2008   OTHER SURGICAL HISTORY  2002   ear surgery   STAPEDES SURGERY Right 2002   Patient Active Problem List   Diagnosis Date Noted   Parkinson's disease with dyskinesia 01/27/2022   Dysphagia, pharyngeal phase 09/17/2021   Cervicalgia 08/05/2021   Spondylosis without myelopathy or radiculopathy, lumbosacral region 06/24/2021   Lumbar  central spinal stenosis, w/o neurogenic claudication (L4-5) 06/03/2021   Lumbar lateral recess stenosis (Bilateral: L2-3, L4-5) (Right: L3-4) 06/03/2021   Lumbosacral foraminal stenosis (Bilateral: L2-3, L3-4) (Left: L5-S1) 06/03/2021   Lumbar nerve root impingement (Right: L3 at L2-3 & L3-4) 06/03/2021   Ligamentum flavum hypertrophy (L3-4, L4-5) 06/03/2021   Abnormal MRI, lumbar spine (04/23/2021) 04/24/2021   Long term prescription benzodiazepine use (alprazolam) (Xanax) 03/24/2021   Grade 1 Retrolisthesis of L2/L3 (5 mm) and L3/L4 (3 mm) 03/24/2021   Levoscoliosis of lumbar spine (L3-4 apex) 03/24/2021   DDD (degenerative disc disease), lumbosacral 03/24/2021   Lumbosacral facet arthropathy (Left: L3-4, L4-5, and L5-S1) 03/24/2021   Tricompartment osteoarthritis of knee (Left) 03/24/2021   Baker cyst (Left) 03/24/2021   Chronic low back pain (1ry area of Pain) (Bilateral) (R>L) w/o sciatica 03/24/2021   Lumbar facet syndrome (Bilateral) 03/24/2021   Chronic lower extremity pain (2ry area of Pain) (Bilateral) (L>R) 03/24/2021   Lumbosacral radiculitis/sensory radiculopathy at L2 (Bilateral) 03/24/2021   Lumbosacral radiculitis/sensory radiculopathy at L3 (Bilateral) 03/24/2021   Chronic pain syndrome 03/23/2021   Pharmacologic therapy 03/23/2021   Disorder of skeletal system 03/23/2021   Problems influencing health status 03/23/2021   PAD (peripheral artery disease) ([place]) 10/01/2020   GAD (generalized anxiety disorder) 01/13/2018   MDD (major depressive disorder) 17/00/1749   Umbilical hernia  09/04/2017   Chronic knee pain (Left) 08/11/2017   Derangement of medial meniscus, posterior horn (Left) 08/11/2017   Vitamin D insufficiency 03/05/2016   BPH without obstruction/lower urinary tract symptoms 07/08/2015   Chronic fatigue 10/23/2014   Major depression, recurrent, full remission ([place]) 07/26/2013   Unsteady gait 07/26/2013   Orthostatic hypotension 07/26/2013   Depression  07/26/2013   Anxiety 06/23/2013   Chronic anxiety 06/23/2013   Tremor 05/18/2013   Bilateral carotid artery stenosis 08/30/2012    ONSET DATE: 08/11/2021  REFERRING DIAG: Parkinsons Disease  THERAPY DIAG:  Difficulty in walking, not elsewhere classified  Unsteadiness on feet  Abnormality of gait and mobility  Muscle weakness (generalized)  Rationale for Evaluation and Treatment Rehabilitation  SUBJECTIVE: Pt reports similar back pain to last session.   Pt accompanied by: spouse  PERTINENT HISTORY: [name] is a 32yoM who is referred to OPPT due to 2 falls in the setting of chronic parkinson's disease. Pt AMB with RW at baseline but is very physically active.     PAIN: Are you having pain? Yes: NPRS scale: 3/10 Pain location: low back Pain description: ache/soreness Aggravating factors: prolonged standing, transfers, walking Relieving factors: rest and medications   PATIENT GOALS  to walk better and be as active as possible. Also to get in/out of cars better.      Therex:  No moist heat this session as clinic's Hydrocollator is still broken.   Seated: PWR! Exercise review  -good ability to perform PWR! Up and rock  -cues for preventing excessive trunk lean with performance of step  -cues for limited range of motion with twist   Pt supported hooklye on plinth with large bolster under B knees to improve comfort in lower back region during all supine activities.   TrA contraction, 5 sec holds with tactile feedback utilized by pt by placing fingers over region of contraction 1 x 15 x 5 sec holds   SAQ to SLR 2 x 10 ea LE   Supine clamshell RTB 2 x 10 x 5 sec holds   Rows with scapular retraction RTB, cues for preventing low back extension -on dynadisc to ensure proper posture  Foam roller extensions seated with cues to prevent low back extension compensation. 20 x 5 second holds, limited thoracic motion but improved lumbar stabalization.     PATIENT  EDUCATION:    Education details: Pt educated throughout session about proper posture and technique with exercises. Improved exercise technique, movement at target joints, use of target muscles after min to mod verbal, visual, tactile cues.  Person educated: Patient Education method: Explanation, Demonstration, Tactile cues, and Verbal cues Education comprehension: verbalized understanding, returned demonstration, verbal cues required, tactile cues required, and needs further education   HOME EXERCISE PROGRAM:No updates to HEP today. Pt to continue HEP as previously given 02/24/22: introduced to seated PWR! Exercises as part of HEP, step and twist omdified to account for low back pain   Access Code: [email or web address] URL: [email or web address] Date: 02/06/2022 Prepared by: Sande Brothers  Exercises - Supine Transversus Abdominis Bracing - Hands on Stomach  - 1 x daily - 7 x weekly - 3 sets - 10 reps - Supine Transversus Abdominis Bracing with Double Leg Fallout  - 1 x daily - 7 x weekly - 3 sets - 10 reps - Supine Hip Adduction Isometric with Ball  - 1 x daily - 7 x weekly - 3 sets - 10 reps   Access Code: [email or web address] URL: [email or web address] Date: 12/09/2021 Prepared by:  Amalia Hailey  Exercises - Seated Alternating Side Stretch with Arm Overhead  - 1 x daily - 7 x weekly - 2 sets - 10 reps - Seated Thoracic Extension Arms Overhead  - 1 x daily - 7 x weekly - 2 sets - 10 reps  GOALS: Goals reviewed with patient? Yes  SHORT TERM GOALS: Target date: MET   Pt will be independent with initial HEP in order to improve strength and balance in order to decrease fall risk and improve function at home and work.  Baseline: 08/21/2021-No formal HEP in place Goal status: MET   LONG TERM GOALS: Target date: 04/17/2022    Pt will improve BERG by at least 3 points in order to demonstrate clinically significant improvement in balance.   Baseline: 08/21/2021= 48/56  8/24:47/56; 11/13/2021= 47/56, 01/06/2022 = Unable to test due to time constraint- will test next session and update goal; 01/08/2022= 51/56- will keep goal active to ensure consistency.  12/21: 52/56 Goal status: Met  2.  Pt will be independent with final HEP in order to improve strength and balance in order to decrease fall risk and improve function at home and work.  Baseline: No HEP in place, 8/24: completing current HEP but still progressing; 11/13/2021- Patient continues to verbalize and demonstrate understanding of HEP for stretching/strengthening and mobility but still progressing and altering program as appropriate; 11/7: Continues follow current HEP but still progressing HEP as patient progresses 12/21: Been walking and completing core activation exercising, not doing as much balance lately.  Goal status: IN PROGRESS  3.  Pt will decrease 5TSTS by at least 4 seconds in order to demonstrate clinically significant improvement in LE strength. Baseline: 08/21/2021= 19.01 sec without UE support 8/24:10.56 sec Goal status: MET  4.  Pt will decrease TUG to below 14 seconds/decrease in order to demonstrate decreased fall risk. Baseline:  08/21/2021=15.67 with RW 8/24: 12.41 Goal status: MET  5.  Pt will improve FOTO to target score of 56 to display perceived improvements in ability to complete ADL's.  Baseline: 08/21/2021=49 8/24: 45.6; 11/13/2021=46, 01/06/2022 = 45.6 12/21: 46 Goal status: IN PROGRESS  6.  Pt will increase 10MWT by at least 0.15 m/s in order to demonstrate clinically significant improvement in community ambulation.   Baseline: 08/22/2021=0.94 m/s 8/24:1.32ms Goal status: MET  7 Pt will improve miniBESTest by at least 4 points to demonstrate clinically significant improvement in reduced falls with parkinsonian related deficits.  Baseline: 19/28; 11/13/2021- Unable to test due to time constraint- will test next session and update goal. 9/19: 18/28; 01/06/2022 - 23/28 Goal Status: MET    8. Pt will increase 6MWT by at least [id number] order to demonstrate clinically significant improvement in cardiopulmonary endurance and community ambulation  Baseline: 11/13/2021= 650 feet with walker; 01/06/2022 = 1160 feet with walker.  Goal Status: MET   9.  Patient will improve modified Oswestry disability index by 10% or greater indicating improved functional capacity secondary to low back pain Baseline: test next visit Goal status: INITIAL  10.  Patient will demonstrate improved core stabilization and hip strength as evidenced by ability to maintain bilateral hip flexion with control lowering to plinth with exacerbation of pain Baseline: Able to complete upon reeval, legs immediately dropped due to weakness. Goal status: INITIAL    ASSESSMENT:  CLINICAL IMPRESSION:  Patient treatment session today focused on ensuring patient has safe and effective exercise program focused on Parkinson's specific moves to work on posture and balance.  Several exercises to  be modified will ensure to check in with patient's regarding tolerance to exercise at home.  Patient continues to progress with lower extremity strengthening and core strengthening exercises this session with overall good results.  Patient also progresses with thoracic mobility but thoracic mobility continues to be significantly limited which is likely playing a part in his lower back pain.Pt will continue to benefit from skilled physical therapy intervention to address impairments, improve QOL, and attain therapy goals.      OBJECTIVE IMPAIRMENTS Abnormal gait, decreased activity tolerance, decreased balance, decreased coordination, decreased endurance, decreased knowledge of use of DME, decreased mobility, difficulty walking, decreased ROM, decreased strength, hypomobility, and impaired flexibility.   ACTIVITY LIMITATIONS carrying, lifting, bending, standing, squatting, sleeping, stairs, transfers, continence, bathing,  toileting, and dressing  PARTICIPATION LIMITATIONS: cleaning, laundry, medication management, driving, shopping, community activity, and yard work  PERSONAL FACTORS Age are also affecting patient's functional outcome.   REHAB POTENTIAL: Good  CLINICAL DECISION MAKING: Stable/uncomplicated  EVALUATION COMPLEXITY: Low  PLAN: PT FREQUENCY: 2x/week  PT DURATION: 12 weeks  PLANNED INTERVENTIONS: Therapeutic exercises, Therapeutic activity, Neuromuscular re-education, Balance training, Gait training, Patient/Family education, Joint mobilization, Stair training, Vestibular training, Canalith repositioning, DME instructions, Dry Needling, Electrical stimulation, Spinal manipulation, and Spinal mobilization  PLAN FOR NEXT SESSION: Establish finalized balance home exercise program and ensure patient completes correctly without exacerbation of back pain, complete modified Oswestry disability index and input into goals, begin targeted therapy for low back pain including lumbar and core stabilization exercises and hip strengthening.  [place] Physical Therapist- [facility]  02/24/22, 4:50 PM

## 2022-02-24 NOTE — Addendum Note (Signed)
Addended by: Judene Companion on: 02/24/2022 12:02 PM   Modules accepted: Orders

## 2022-02-26 ENCOUNTER — Ambulatory Visit: Payer: Medicare HMO | Admitting: Physical Therapy

## 2022-02-26 ENCOUNTER — Encounter: Payer: Medicare HMO | Admitting: Speech Pathology

## 2022-02-26 ENCOUNTER — Encounter: Payer: Medicare HMO | Admitting: Occupational Therapy

## 2022-02-26 DIAGNOSIS — R2681 Unsteadiness on feet: Secondary | ICD-10-CM | POA: Diagnosis not present

## 2022-02-26 DIAGNOSIS — M6281 Muscle weakness (generalized): Secondary | ICD-10-CM

## 2022-02-26 DIAGNOSIS — R278 Other lack of coordination: Secondary | ICD-10-CM | POA: Diagnosis not present

## 2022-02-26 DIAGNOSIS — R269 Unspecified abnormalities of gait and mobility: Secondary | ICD-10-CM | POA: Diagnosis not present

## 2022-02-26 DIAGNOSIS — G20C Parkinsonism, unspecified: Secondary | ICD-10-CM | POA: Diagnosis not present

## 2022-02-26 DIAGNOSIS — R2689 Other abnormalities of gait and mobility: Secondary | ICD-10-CM

## 2022-02-26 DIAGNOSIS — R262 Difficulty in walking, not elsewhere classified: Secondary | ICD-10-CM | POA: Diagnosis not present

## 2022-02-26 NOTE — Therapy (Signed)
OUTPATIENT PHYSICAL THERAPY NEURO TREATMENT Patient Name: Joseph Arroyo MRN: [id number] DOB:Dec 09, 1940, 81 y.o., male Today's Date: 02/26/2022  PCP: Joseph Hauser, DO REFERRING PROVIDER: Lavena Bullion, NP   PT End of Session - 02/26/22 7893     Visit Number 42    Number of Visits 15    Date for PT Re-Evaluation 04/16/22    Authorization Type [employer]    Authorization Time Period 11/13/2021-02/05/2022    Progress Note Due on Visit 67    PT Start Time 1433    PT Stop Time 1513    PT Time Calculation (min) 40 min    Equipment Utilized During Treatment Gait belt    Activity Tolerance Patient tolerated treatment well;No increased pain    Behavior During Therapy Joseph Arroyo for tasks assessed/performed                     Past Medical History:  Diagnosis Date   Allergic rhinitis due to pollen 11/21/2007   Brachial neuritis or radiculitis NOS    Cervicalgia    Concussion    age 32 - s/p accident   Costal chondritis    Depression    Essential and other specified forms of tremor    GERD (gastroesophageal reflux disease)    in past   Lumbago    Occlusion and stenosis of carotid artery without mention of cerebral infarction    Seizures [facility])    age 72 - after concussion   Past Surgical History:  Procedure Laterality Date   CATARACT EXTRACTION Right 07/18/14   CATARACT EXTRACTION W/PHACO Left 08/01/2014   Procedure: CATARACT EXTRACTION PHACO AND INTRAOCULAR LENS PLACEMENT ([place]);  Surgeon: Joseph Koyanagi, MD;  Location: [facility];  Service: Ophthalmology;  Laterality: Left;  IVA TOPICAL   COLONOSCOPY  2008   OTHER SURGICAL HISTORY  2002   ear surgery   STAPEDES SURGERY Right 2002   Patient Active Problem List   Diagnosis Date Noted   Parkinson's disease with dyskinesia 01/27/2022   Dysphagia, pharyngeal phase 09/17/2021   Cervicalgia 08/05/2021   Spondylosis without myelopathy or radiculopathy, lumbosacral region 06/24/2021   Lumbar  central spinal stenosis, w/o neurogenic claudication (L4-5) 06/03/2021   Lumbar lateral recess stenosis (Bilateral: L2-3, L4-5) (Right: L3-4) 06/03/2021   Lumbosacral foraminal stenosis (Bilateral: L2-3, L3-4) (Left: L5-S1) 06/03/2021   Lumbar nerve root impingement (Right: L3 at L2-3 & L3-4) 06/03/2021   Ligamentum flavum hypertrophy (L3-4, L4-5) 06/03/2021   Abnormal MRI, lumbar spine (04/23/2021) 04/24/2021   Long term prescription benzodiazepine use (alprazolam) (Xanax) 03/24/2021   Grade 1 Retrolisthesis of L2/L3 (5 mm) and L3/L4 (3 mm) 03/24/2021   Levoscoliosis of lumbar spine (L3-4 apex) 03/24/2021   DDD (degenerative disc disease), lumbosacral 03/24/2021   Lumbosacral facet arthropathy (Left: L3-4, L4-5, and L5-S1) 03/24/2021   Tricompartment osteoarthritis of knee (Left) 03/24/2021   Baker cyst (Left) 03/24/2021   Chronic low back pain (1ry area of Pain) (Bilateral) (R>L) w/o sciatica 03/24/2021   Lumbar facet syndrome (Bilateral) 03/24/2021   Chronic lower extremity pain (2ry area of Pain) (Bilateral) (L>R) 03/24/2021   Lumbosacral radiculitis/sensory radiculopathy at L2 (Bilateral) 03/24/2021   Lumbosacral radiculitis/sensory radiculopathy at L3 (Bilateral) 03/24/2021   Chronic pain syndrome 03/23/2021   Pharmacologic therapy 03/23/2021   Disorder of skeletal system 03/23/2021   Problems influencing health status 03/23/2021   PAD (peripheral artery disease) ([place]) 10/01/2020   GAD (generalized anxiety disorder) 01/13/2018   MDD (major depressive disorder) 81/03/7508   Umbilical hernia  09/04/2017   Chronic knee pain (Left) 08/11/2017   Derangement of medial meniscus, posterior horn (Left) 08/11/2017   Vitamin D insufficiency 03/05/2016   BPH without obstruction/lower urinary tract symptoms 07/08/2015   Chronic fatigue 10/23/2014   Major depression, recurrent, full remission ([place]) 07/26/2013   Unsteady gait 07/26/2013   Orthostatic hypotension 07/26/2013   Depression  07/26/2013   Anxiety 06/23/2013   Chronic anxiety 06/23/2013   Tremor 05/18/2013   Bilateral carotid artery stenosis 08/30/2012    ONSET DATE: 08/11/2021  REFERRING DIAG: Parkinsons Disease  THERAPY DIAG:  Difficulty in walking, not elsewhere classified  Unsteadiness on feet  Abnormality of gait and mobility  Muscle weakness (generalized)  Other abnormalities of gait and mobility  Rationale for Evaluation and Treatment Rehabilitation  SUBJECTIVE: Pt reports slightly decreased low back pain compared to last session. Pt reports PWR! HEP has been going well with no issues.   Pt accompanied by: spouse  PERTINENT HISTORY: Joseph Arroyo is a 63yoM who is referred to OPPT due to 2 falls in the setting of chronic parkinson's disease. Pt AMB with RW at baseline but is very physically active.     PAIN: Are you having pain? Yes: NPRS scale: 3/10 Pain location: low back Pain description: ache/soreness Aggravating factors: prolonged standing, transfers, walking Relieving factors: rest and medications   PATIENT GOALS  to walk better and be as active as possible. Also to get in/out of cars better.     Therex:  Moist heat in supine  Seated: PWR! Exercise review  -good ability to perform PWR! Up and rock  -cues for preventing excessive trunk lean with performance of step  -cues for limited range of motion with twist   Standing PWR! Up, rock and step. Rock and step with UE assist for safety.   Pt supported hooklye on plinth with large bolster under B knees to improve comfort in lower back region during all supine activities.   TrA contraction, 5 sec holds with tactile feedback utilized by pt by placing fingers over region of contraction 1 x 15 x 5 sec holds   Supine clamshell RTB 2 x 10 x 5 sec holds    PATIENT EDUCATION:    Education details: Pt educated throughout session about proper posture and technique with exercises. Improved exercise technique, movement at  target joints, use of target muscles after min to mod verbal, visual, tactile cues.  Person educated: Patient Education method: Explanation, Demonstration, Tactile cues, and Verbal cues Education comprehension: verbalized understanding, returned demonstration, verbal cues required, tactile cues required, and needs further education   HOME EXERCISE PROGRAM:No updates to HEP today. Pt to continue HEP as previously given 02/24/22: introduced to seated PWR! Exercises as part of HEP, step and twist modified to account for low back pain   Access Code: [email or web address] URL: [email or web address] Date: 02/06/2022 Prepared by: Sande Brothers  Exercises - Supine Transversus Abdominis Bracing - Hands on Stomach  - 1 x daily - 7 x weekly - 3 sets - 10 reps - Supine Transversus Abdominis Bracing with Double Leg Fallout  - 1 x daily - 7 x weekly - 3 sets - 10 reps - Supine Hip Adduction Isometric with Ball  - 1 x daily - 7 x weekly - 3 sets - 10 reps   Access Code: [email or web address] URL: [email or web address] Date: 12/09/2021 Prepared by: Amalia Hailey  Exercises - Seated Alternating Side Stretch with Arm Overhead  - 1 x daily - 7 x weekly - 2 sets - 10 reps -  Seated Thoracic Extension Arms Overhead  - 1 x daily - 7 x weekly - 2 sets - 10 reps  GOALS: Goals reviewed with patient? Yes  SHORT TERM GOALS: Target date: MET   Pt will be independent with initial HEP in order to improve strength and balance in order to decrease fall risk and improve function at home and work.  Baseline: 08/21/2021-No formal HEP in place Goal status: MET   LONG TERM GOALS: Target date: 04/17/2022    Pt will improve BERG by at least 3 points in order to demonstrate clinically significant improvement in balance.   Baseline: 08/21/2021= 48/56 8/24:47/56; 11/13/2021= 47/56, 01/06/2022 = Unable to test due to time constraint- will test next session and update goal; 01/08/2022= 51/56- will keep goal active  to ensure consistency.  12/21: 52/56 Goal status: Met  2.  Pt will be independent with final HEP in order to improve strength and balance in order to decrease fall risk and improve function at home and work.  Baseline: No HEP in place, 8/24: completing current HEP but still progressing; 11/13/2021- Patient continues to verbalize and demonstrate understanding of HEP for stretching/strengthening and mobility but still progressing and altering program as appropriate; 11/7: Continues follow current HEP but still progressing HEP as patient progresses 12/21: Been walking and completing core activation exercising, not doing as much balance lately.  Goal status: IN PROGRESS  3.  Pt will decrease 5TSTS by at least 4 seconds in order to demonstrate clinically significant improvement in LE strength. Baseline: 08/21/2021= 19.01 sec without UE support 8/24:10.56 sec Goal status: MET  4.  Pt will decrease TUG to below 14 seconds/decrease in order to demonstrate decreased fall risk. Baseline:  08/21/2021=15.67 with RW 8/24: 12.41 Goal status: MET  5.  Pt will improve FOTO to target score of 56 to display perceived improvements in ability to complete ADL's.  Baseline: 08/21/2021=49 8/24: 45.6; 11/13/2021=46, 01/06/2022 = 45.6 12/21: 46 Goal status: IN PROGRESS  6.  Pt will increase 10MWT by at least 0.15 m/s in order to demonstrate clinically significant improvement in community ambulation.   Baseline: 08/22/2021=0.94 m/s 8/24:1.33ms Goal status: MET  7 Pt will improve miniBESTest by at least 4 points to demonstrate clinically significant improvement in reduced falls with parkinsonian related deficits.  Baseline: 19/28; 11/13/2021- Unable to test due to time constraint- will test next session and update goal. 9/19: 18/28; 01/06/2022 - 23/28 Goal Status: MET   8. Pt will increase 6MWT by at least [id number] order to demonstrate clinically significant improvement in cardiopulmonary endurance and community  ambulation  Baseline: 11/13/2021= 650 feet with walker; 01/06/2022 = 1160 feet with walker.  Goal Status: MET   9.  Patient will improve modified Oswestry disability index by 10% or greater indicating improved functional capacity secondary to low back pain Baseline: 12/28:58% Goal status: INITIAL  10.  Patient will demonstrate improved core stabilization and hip strength as evidenced by ability to maintain bilateral hip flexion with control lowering to plinth with exacerbation of pain Baseline: Able to complete upon reeval, legs immediately dropped due to weakness. Goal status: INITIAL    ASSESSMENT:  CLINICAL IMPRESSION:  Patient treatment session today focused on ensuring patient has safe and effective exercise program focused on Parkinson's specific moves to work on posture and balance.  Pt continues with progression to standing PWR! Exercises with some modifications for safety with balance.  Patient continues to progress with lower extremity strengthening and core strengthening exercises this session with overall good  results.  Pt will continue to benefit from skilled physical therapy intervention to address impairments, improve QOL, and attain therapy goals.      OBJECTIVE IMPAIRMENTS Abnormal gait, decreased activity tolerance, decreased balance, decreased coordination, decreased endurance, decreased knowledge of use of DME, decreased mobility, difficulty walking, decreased ROM, decreased strength, hypomobility, and impaired flexibility.   ACTIVITY LIMITATIONS carrying, lifting, bending, standing, squatting, sleeping, stairs, transfers, continence, bathing, toileting, and dressing  PARTICIPATION LIMITATIONS: cleaning, laundry, medication management, driving, shopping, community activity, and yard work  PERSONAL FACTORS Age are also affecting patient's functional outcome.   REHAB POTENTIAL: Good  CLINICAL DECISION MAKING: Stable/uncomplicated  EVALUATION COMPLEXITY:  Low  PLAN: PT FREQUENCY: 2x/week  PT DURATION: 12 weeks  PLANNED INTERVENTIONS: Therapeutic exercises, Therapeutic activity, Neuromuscular re-education, Balance training, Gait training, Patient/Family education, Joint mobilization, Stair training, Vestibular training, Canalith repositioning, DME instructions, Dry Needling, Electrical stimulation, Spinal manipulation, and Spinal mobilization  PLAN FOR NEXT SESSION: Establish finalized balance home exercise program and ensure patient completes correctly without exacerbation of back pain, continued targeted therapy for low back pain including lumbar and core stabilization exercises and hip strengthening.  [place] Physical Therapist- [facility]  02/26/22, 5:01 PM

## 2022-03-03 ENCOUNTER — Encounter: Payer: Medicare HMO | Admitting: Speech Pathology

## 2022-03-03 ENCOUNTER — Encounter: Payer: Self-pay | Admitting: Physical Therapy

## 2022-03-03 ENCOUNTER — Ambulatory Visit: Payer: Medicare HMO | Attending: Neurology | Admitting: Physical Therapy

## 2022-03-03 ENCOUNTER — Encounter: Payer: Medicare HMO | Admitting: Occupational Therapy

## 2022-03-03 DIAGNOSIS — R2689 Other abnormalities of gait and mobility: Secondary | ICD-10-CM | POA: Insufficient documentation

## 2022-03-03 DIAGNOSIS — R269 Unspecified abnormalities of gait and mobility: Secondary | ICD-10-CM | POA: Diagnosis not present

## 2022-03-03 DIAGNOSIS — R262 Difficulty in walking, not elsewhere classified: Secondary | ICD-10-CM | POA: Insufficient documentation

## 2022-03-03 DIAGNOSIS — R2681 Unsteadiness on feet: Secondary | ICD-10-CM | POA: Diagnosis not present

## 2022-03-03 DIAGNOSIS — M6281 Muscle weakness (generalized): Secondary | ICD-10-CM | POA: Diagnosis not present

## 2022-03-03 DIAGNOSIS — M5459 Other low back pain: Secondary | ICD-10-CM | POA: Insufficient documentation

## 2022-03-03 NOTE — Therapy (Signed)
OUTPATIENT PHYSICAL THERAPY NEURO TREATMENT Patient Name: Joseph Arroyo MRN: [id number] DOB:[birth year], 82 y.o., male Today's Date: 03/03/2022  PCP: Olin Hauser, DO REFERRING PROVIDER: Lavena Bullion, NP   PT End of Session - 03/03/22 1451     Visit Number 43    Number of Visits 52    Date for PT Re-Evaluation 04/16/22    Authorization Type [employer]    Authorization Time Period 11/13/2021-02/05/2022    Progress Note Due on Visit 48    PT Start Time 1434    PT Stop Time 1515    PT Time Calculation (min) 41 min    Equipment Utilized During Treatment Gait belt    Activity Tolerance Patient tolerated treatment well;No increased pain    Behavior During Therapy [facility] for tasks assessed/performed                      Past Medical History:  Diagnosis Date   Allergic rhinitis due to pollen 11/21/2007   Brachial neuritis or radiculitis NOS    Cervicalgia    Concussion    age 35 - s/p accident   Costal chondritis    Depression    Essential and other specified forms of tremor    GERD (gastroesophageal reflux disease)    in past   Lumbago    Occlusion and stenosis of carotid artery without mention of cerebral infarction    Seizures [facility])    age 11 - after concussion   Past Surgical History:  Procedure Laterality Date   CATARACT EXTRACTION Right 07/18/14   CATARACT EXTRACTION W/PHACO Left 08/01/2014   Procedure: CATARACT EXTRACTION PHACO AND INTRAOCULAR LENS PLACEMENT ([place]);  Surgeon: Leandrew Koyanagi, MD;  Location: [place];  Service: Ophthalmology;  Laterality: Left;  IVA TOPICAL   COLONOSCOPY  2008   OTHER SURGICAL HISTORY  2002   ear surgery   STAPEDES SURGERY Right 2002   Patient Active Problem List   Diagnosis Date Noted   Parkinson's disease with dyskinesia 01/27/2022   Dysphagia, pharyngeal phase 09/17/2021   Cervicalgia 08/05/2021   Spondylosis without myelopathy or radiculopathy, lumbosacral region 06/24/2021   Lumbar  central spinal stenosis, w/o neurogenic claudication (L4-5) 06/03/2021   Lumbar lateral recess stenosis (Bilateral: L2-3, L4-5) (Right: L3-4) 06/03/2021   Lumbosacral foraminal stenosis (Bilateral: L2-3, L3-4) (Left: L5-S1) 06/03/2021   Lumbar nerve root impingement (Right: L3 at L2-3 & L3-4) 06/03/2021   Ligamentum flavum hypertrophy (L3-4, L4-5) 06/03/2021   Abnormal MRI, lumbar spine (04/23/2021) 04/24/2021   Long term prescription benzodiazepine use (alprazolam) (Xanax) 03/24/2021   Grade 1 Retrolisthesis of L2/L3 (5 mm) and L3/L4 (3 mm) 03/24/2021   Levoscoliosis of lumbar spine (L3-4 apex) 03/24/2021   DDD (degenerative disc disease), lumbosacral 03/24/2021   Lumbosacral facet arthropathy (Left: L3-4, L4-5, and L5-S1) 03/24/2021   Tricompartment osteoarthritis of knee (Left) 03/24/2021   Baker cyst (Left) 03/24/2021   Chronic low back pain (1ry area of Pain) (Bilateral) (R>L) w/o sciatica 03/24/2021   Lumbar facet syndrome (Bilateral) 03/24/2021   Chronic lower extremity pain (2ry area of Pain) (Bilateral) (L>R) 03/24/2021   Lumbosacral radiculitis/sensory radiculopathy at L2 (Bilateral) 03/24/2021   Lumbosacral radiculitis/sensory radiculopathy at L3 (Bilateral) 03/24/2021   Chronic pain syndrome 03/23/2021   Pharmacologic therapy 03/23/2021   Disorder of skeletal system 03/23/2021   Problems influencing health status 03/23/2021   PAD (peripheral artery disease) ([place]) 10/01/2020   GAD (generalized anxiety disorder) 01/13/2018   MDD (major depressive disorder) 17/91/5056   Umbilical  hernia 09/04/2017   Chronic knee pain (Left) 08/11/2017   Derangement of medial meniscus, posterior horn (Left) 08/11/2017   Vitamin D insufficiency 03/05/2016   BPH without obstruction/lower urinary tract symptoms 07/08/2015   Chronic fatigue 10/23/2014   Major depression, recurrent, full remission ([place]) 07/26/2013   Unsteady gait 07/26/2013   Orthostatic hypotension 07/26/2013   Depression  07/26/2013   Anxiety 06/23/2013   Chronic anxiety 06/23/2013   Tremor 05/18/2013   Bilateral carotid artery stenosis 08/30/2012    ONSET DATE: 08/11/2021  REFERRING DIAG: Parkinsons Disease  THERAPY DIAG:  Difficulty in walking, not elsewhere classified  Unsteadiness on feet  Abnormality of gait and mobility  Muscle weakness (generalized)  Other abnormalities of gait and mobility  Rationale for Evaluation and Treatment Rehabilitation  SUBJECTIVE: Pt reports slightly decreased low back pain compared to last session. Pt reports PWR! HEP has been going well with no issues. Pt caregiver report issue on recumbent bike where he was pedaling very fast and uncontrollably. Pt, caregiver and PT deduct the bike turned off and the resistance went down but he did not realize. Pt and caregiver also educated regarding medication management timing with gym exercise form improved symptoms.   Pt accompanied by: spouse  PERTINENT HISTORY: [name] is a 24yoM who is referred to OPPT due to 2 falls in the setting of chronic parkinson's disease. Pt AMB with RW at baseline but is very physically active.     PAIN: Are you having pain? Yes: NPRS scale: 3/10 Pain location: low back Pain description: ache/soreness Aggravating factors: prolonged standing, transfers, walking Relieving factors: rest and medications   PATIENT GOALS  to walk better and be as active as possible. Also to get in/out of cars better.     Therex:  Moist heat in supine Pt supported hooklye on plinth with large bolster under B knees to improve comfort in lower back region during all supine activities.   TrA contraction, 5 sec holds with tactile feedback utilized by pt by placing fingers over region of contraction 1 x 15 x 5 sec holds   Supine Clamshell with RTB 2 x 10 x 5 sec holds   Supine march with TrA contraction   Seated on Dynadisc  - Rows 15 x with GTB   - Seated T spine extensions with 1/2 foam  roller in chair behind 10 x 5 sec holds      PATIENT EDUCATION:    Education details: Pt educated throughout session about proper posture and technique with exercises. Improved exercise technique, movement at target joints, use of target muscles after min to mod verbal, visual, tactile cues.  Person educated: Patient Education method: Explanation, Demonstration, Tactile cues, and Verbal cues Education comprehension: verbalized understanding, returned demonstration, verbal cues required, tactile cues required, and needs further education   HOME EXERCISE PROGRAM:No updates to HEP today. Pt to continue HEP as previously given 02/24/22: introduced to seated PWR! Exercises as part of HEP, step and twist modified to account for low back pain   Access Code: [email or web address] URL: [email or web address] Date: 02/06/2022 Prepared by: Sande Brothers  Exercises - Supine Transversus Abdominis Bracing - Hands on Stomach  - 1 x daily - 7 x weekly - 3 sets - 10 reps - Supine Transversus Abdominis Bracing with Double Leg Fallout  - 1 x daily - 7 x weekly - 3 sets - 10 reps - Supine Hip Adduction Isometric with Ball  - 1 x daily - 7 x weekly - 3 sets - 10  reps   Access Code: [email or web address] URL: [email or web address] Date: 12/09/2021 Prepared by: Amalia Hailey  Exercises - Seated Alternating Side Stretch with Arm Overhead  - 1 x daily - 7 x weekly - 2 sets - 10 reps - Seated Thoracic Extension Arms Overhead  - 1 x daily - 7 x weekly - 2 sets - 10 reps  GOALS: Goals reviewed with patient? Yes  SHORT TERM GOALS: Target date: MET   Pt will be independent with initial HEP in order to improve strength and balance in order to decrease fall risk and improve function at home and work.  Baseline: 08/21/2021-No formal HEP in place Goal status: MET   LONG TERM GOALS: Target date: 04/17/2022    Pt will improve BERG by at least 3 points in order to demonstrate clinically  significant improvement in balance.   Baseline: 08/21/2021= 48/56 8/24:47/56; 11/13/2021= 47/56, 01/06/2022 = Unable to test due to time constraint- will test next session and update goal; 01/08/2022= 51/56- will keep goal active to ensure consistency.  12/21: 52/56 Goal status: Met  2.  Pt will be independent with final HEP in order to improve strength and balance in order to decrease fall risk and improve function at home and work.  Baseline: No HEP in place, 8/24: completing current HEP but still progressing; 11/13/2021- Patient continues to verbalize and demonstrate understanding of HEP for stretching/strengthening and mobility but still progressing and altering program as appropriate; 11/7: Continues follow current HEP but still progressing HEP as patient progresses 12/21: Been walking and completing core activation exercising, not doing as much balance lately.  Goal status: IN PROGRESS  3.  Pt will decrease 5TSTS by at least 4 seconds in order to demonstrate clinically significant improvement in LE strength. Baseline: 08/21/2021= 19.01 sec without UE support 8/24:10.56 sec Goal status: MET  4.  Pt will decrease TUG to below 14 seconds/decrease in order to demonstrate decreased fall risk. Baseline:  08/21/2021=15.67 with RW 8/24: 12.41 Goal status: MET  5.  Pt will improve FOTO to target score of 56 to display perceived improvements in ability to complete ADL's.  Baseline: 08/21/2021=49 8/24: 45.6; 11/13/2021=46, 01/06/2022 = 45.6 12/21: 46 Goal status: IN PROGRESS  6.  Pt will increase 10MWT by at least 0.15 m/s in order to demonstrate clinically significant improvement in community ambulation.   Baseline: 08/22/2021=0.94 m/s 8/24:1.43ms Goal status: MET  7 Pt will improve miniBESTest by at least 4 points to demonstrate clinically significant improvement in reduced falls with parkinsonian related deficits.  Baseline: 19/28; 11/13/2021- Unable to test due to time constraint- will test next session  and update goal. 9/19: 18/28; 01/06/2022 - 23/28 Goal Status: MET   8. Pt will increase 6MWT by at least [id number] order to demonstrate clinically significant improvement in cardiopulmonary endurance and community ambulation  Baseline: 11/13/2021= 650 feet with walker; 01/06/2022 = 1160 feet with walker.  Goal Status: MET   9.  Patient will improve modified Oswestry disability index by 10% or greater indicating improved functional capacity secondary to low back pain Baseline: 12/28:58% Goal status: INITIAL  10.  Patient will demonstrate improved core stabilization and hip strength as evidenced by ability to maintain bilateral hip flexion with control lowering to plinth with exacerbation of pain Baseline: Able to complete upon reeval, legs immediately dropped due to weakness. Goal status: INITIAL    ASSESSMENT:  CLINICAL IMPRESSION:  Continued with current plan of care as laid out in evaluation and recent prior sessions. Pt remains motivated to  advance progress toward goals in order to maximize independence and safety at home. Pt requires high level assistance and cuing for completion of exercises in order to provide adequate level of stimulation challenge while minimizing pain and discomfort when possible. Pt closely monitored throughout session pt response and to maximize patient safety during interventions. Pt continues to demonstrate progress toward goals AEB progression of interventions this date either in volume or intensity.  Pt will continue to benefit from skilled physical therapy intervention to address impairments, improve QOL, and attain therapy goals.      OBJECTIVE IMPAIRMENTS Abnormal gait, decreased activity tolerance, decreased balance, decreased coordination, decreased endurance, decreased knowledge of use of DME, decreased mobility, difficulty walking, decreased ROM, decreased strength, hypomobility, and impaired flexibility.   ACTIVITY LIMITATIONS carrying, lifting,  bending, standing, squatting, sleeping, stairs, transfers, continence, bathing, toileting, and dressing  PARTICIPATION LIMITATIONS: cleaning, laundry, medication management, driving, shopping, community activity, and yard work  PERSONAL FACTORS Age are also affecting patient's functional outcome.   REHAB POTENTIAL: Good  CLINICAL DECISION MAKING: Stable/uncomplicated  EVALUATION COMPLEXITY: Low  PLAN: PT FREQUENCY: 2x/week  PT DURATION: 12 weeks  PLANNED INTERVENTIONS: Therapeutic exercises, Therapeutic activity, Neuromuscular re-education, Balance training, Gait training, Patient/Family education, Joint mobilization, Stair training, Vestibular training, Canalith repositioning, DME instructions, Dry Needling, Electrical stimulation, Spinal manipulation, and Spinal mobilization  PLAN FOR NEXT SESSION: Establish finalized balance home exercise program and ensure patient completes correctly without exacerbation of back pain, continued targeted therapy for low back pain including lumbar and core stabilization exercises and hip strengthening.  [facility]  03/03/22, 2:51 PM

## 2022-03-05 ENCOUNTER — Encounter: Payer: Self-pay | Admitting: Physical Therapy

## 2022-03-05 ENCOUNTER — Encounter: Payer: Medicare HMO | Admitting: Occupational Therapy

## 2022-03-05 ENCOUNTER — Ambulatory Visit: Payer: Medicare HMO | Admitting: Physical Therapy

## 2022-03-05 ENCOUNTER — Encounter: Payer: Medicare HMO | Admitting: Speech Pathology

## 2022-03-05 DIAGNOSIS — R262 Difficulty in walking, not elsewhere classified: Secondary | ICD-10-CM

## 2022-03-05 DIAGNOSIS — R269 Unspecified abnormalities of gait and mobility: Secondary | ICD-10-CM

## 2022-03-05 DIAGNOSIS — R2681 Unsteadiness on feet: Secondary | ICD-10-CM | POA: Diagnosis not present

## 2022-03-05 DIAGNOSIS — M6281 Muscle weakness (generalized): Secondary | ICD-10-CM | POA: Diagnosis not present

## 2022-03-05 DIAGNOSIS — M5459 Other low back pain: Secondary | ICD-10-CM | POA: Diagnosis not present

## 2022-03-05 DIAGNOSIS — R2689 Other abnormalities of gait and mobility: Secondary | ICD-10-CM | POA: Diagnosis not present

## 2022-03-05 NOTE — Therapy (Signed)
OUTPATIENT PHYSICAL THERAPY NEURO TREATMENT Patient Name: Joseph Arroyo MRN: [id number] DOB:1940/06/30, 82 y.o., male Today's Date: 03/05/2022  PCP: Joseph Hauser, DO REFERRING PROVIDER: Lavena Bullion, NP   PT End of Session - 03/05/22 0918     Visit Number 71    Number of Visits 2    Date for PT Re-Evaluation 04/16/22    Authorization Type [employer]    Authorization Time Period 11/13/2021-02/05/2022    Progress Note Due on Visit 6    PT Start Time 0930    PT Stop Time 1012    PT Time Calculation (min) 42 min    Equipment Utilized During Treatment Gait belt    Activity Tolerance Patient tolerated treatment well;No increased pain    Behavior During Therapy [facility] for tasks assessed/performed                      Past Medical History:  Diagnosis Date   Allergic rhinitis due to pollen 11/21/2007   Brachial neuritis or radiculitis NOS    Cervicalgia    Concussion    age 30 - s/p accident   Costal chondritis    Depression    Essential and other specified forms of tremor    GERD (gastroesophageal reflux disease)    in past   Lumbago    Occlusion and stenosis of carotid artery without mention of cerebral infarction    Seizures [facility])    age 21 - after concussion   Past Surgical History:  Procedure Laterality Date   CATARACT EXTRACTION Right 07/18/14   CATARACT EXTRACTION W/PHACO Left 08/01/2014   Procedure: CATARACT EXTRACTION PHACO AND INTRAOCULAR LENS PLACEMENT ([place]);  Surgeon: Leandrew Koyanagi, MD;  Location: [place];  Service: Ophthalmology;  Laterality: Left;  IVA TOPICAL   COLONOSCOPY  2008   OTHER SURGICAL HISTORY  2002   ear surgery   STAPEDES SURGERY Right 2002   Patient Active Problem List   Diagnosis Date Noted   Parkinson's disease with dyskinesia 01/27/2022   Dysphagia, pharyngeal phase 09/17/2021   Cervicalgia 08/05/2021   Spondylosis without myelopathy or radiculopathy, lumbosacral region 06/24/2021   Lumbar  central spinal stenosis, w/o neurogenic claudication (L4-5) 06/03/2021   Lumbar lateral recess stenosis (Bilateral: L2-3, L4-5) (Right: L3-4) 06/03/2021   Lumbosacral foraminal stenosis (Bilateral: L2-3, L3-4) (Left: L5-S1) 06/03/2021   Lumbar nerve root impingement (Right: L3 at L2-3 & L3-4) 06/03/2021   Ligamentum flavum hypertrophy (L3-4, L4-5) 06/03/2021   Abnormal MRI, lumbar spine (04/23/2021) 04/24/2021   Long term prescription benzodiazepine use (alprazolam) (Xanax) 03/24/2021   Grade 1 Retrolisthesis of L2/L3 (5 mm) and L3/L4 (3 mm) 03/24/2021   Levoscoliosis of lumbar spine (L3-4 apex) 03/24/2021   DDD (degenerative disc disease), lumbosacral 03/24/2021   Lumbosacral facet arthropathy (Left: L3-4, L4-5, and L5-S1) 03/24/2021   Tricompartment osteoarthritis of knee (Left) 03/24/2021   Baker cyst (Left) 03/24/2021   Chronic low back pain (1ry area of Pain) (Bilateral) (R>L) w/o sciatica 03/24/2021   Lumbar facet syndrome (Bilateral) 03/24/2021   Chronic lower extremity pain (2ry area of Pain) (Bilateral) (L>R) 03/24/2021   Lumbosacral radiculitis/sensory radiculopathy at L2 (Bilateral) 03/24/2021   Lumbosacral radiculitis/sensory radiculopathy at L3 (Bilateral) 03/24/2021   Chronic pain syndrome 03/23/2021   Pharmacologic therapy 03/23/2021   Disorder of skeletal system 03/23/2021   Problems influencing health status 03/23/2021   PAD (peripheral artery disease) ([place]) 10/01/2020   GAD (generalized anxiety disorder) 01/13/2018   MDD (major depressive disorder) 14/48/1856   Umbilical  hernia 09/04/2017   Chronic knee pain (Left) 08/11/2017   Derangement of medial meniscus, posterior horn (Left) 08/11/2017   Vitamin D insufficiency 03/05/2016   BPH without obstruction/lower urinary tract symptoms 07/08/2015   Chronic fatigue 10/23/2014   Major depression, recurrent, full remission ([place]) 07/26/2013   Unsteady gait 07/26/2013   Orthostatic hypotension 07/26/2013   Depression  07/26/2013   Anxiety 06/23/2013   Chronic anxiety 06/23/2013   Tremor 05/18/2013   Bilateral carotid artery stenosis 08/30/2012    ONSET DATE: 08/11/2021  REFERRING DIAG: Parkinsons Disease  THERAPY DIAG:  Difficulty in walking, not elsewhere classified  Unsteadiness on feet  Abnormality of gait and mobility  Muscle weakness (generalized)  Other abnormalities of gait and mobility  Rationale for Evaluation and Treatment Rehabilitation  SUBJECTIVE: Pt reports slightly decreased low back pain compared to last session. Pt reports no falls or Lob since last date.   Pt accompanied by: spouse  PERTINENT HISTORY: Joseph Arroyo is a 38yoM who is referred to OPPT due to 2 falls in the setting of chronic parkinson's disease. Pt AMB with RW at baseline but is very physically active.   PAIN: Are you having pain? Yes: NPRS scale: 2/10 Pain location: low back Pain description: ache/soreness Aggravating factors: prolonged standing, transfers, walking Relieving factors: rest and medications   PATIENT GOALS  to walk better and be as active as possible. Also to get in/out of cars better.     Therex:  Moist heat in supine for pain relief and for increased blood flow to target muscles via vasodilation   Pt supported hooklye on plinth with large bolster under B knees to improve comfort in lower back region during all supine activities.   TrA contraction, 5 sec holds with tactile feedback utilized by pt by placing fingers over region of contraction 1 x 15 x 5 sec holds   Supine Clamshell with RTB 2 x 10 x 5 sec holds   Supine march with TrA contraction x 10 ea   Supine Ball squeeze 20 x 5 sec holds with TrA activation   SLR unilateral 2 x 7 reps ea LE, consistent checking in for pain but pt just notes difficulty, challenged with TrA contraction simultaneously for core stabilization   Seated on Dynadisc  - Rows 2 x 15  with GTB       PATIENT EDUCATION:    Education  details: Pt educated throughout session about proper posture and technique with exercises. Improved exercise technique, movement at target joints, use of target muscles after min to mod verbal, visual, tactile cues.  Person educated: Patient Education method: Explanation, Demonstration, Tactile cues, and Verbal cues Education comprehension: verbalized understanding, returned demonstration, verbal cues required, tactile cues required, and needs further education   HOME EXERCISE PROGRAM:No updates to HEP today. Pt to continue HEP as previously given 02/24/22: introduced to seated PWR! Exercises as part of HEP, step and twist modified to account for low back pain   Access Code: [email or web address] URL: [email or web address] Date: 02/06/2022 Prepared by: Sande Brothers  Exercises - Supine Transversus Abdominis Bracing - Hands on Stomach  - 1 x daily - 7 x weekly - 3 sets - 10 reps - Supine Transversus Abdominis Bracing with Double Leg Fallout  - 1 x daily - 7 x weekly - 3 sets - 10 reps - Supine Hip Adduction Isometric with Ball  - 1 x daily - 7 x weekly - 3 sets - 10 reps   Access Code: [email or web address] URL: [email or web address] Date: 12/09/2021  Prepared by: Amalia Hailey  Exercises - Seated Alternating Side Stretch with Arm Overhead  - 1 x daily - 7 x weekly - 2 sets - 10 reps - Seated Thoracic Extension Arms Overhead  - 1 x daily - 7 x weekly - 2 sets - 10 reps  GOALS: Goals reviewed with patient? Yes  SHORT TERM GOALS: Target date: MET   Pt will be independent with initial HEP in order to improve strength and balance in order to decrease fall risk and improve function at home and work.  Baseline: 08/21/2021-No formal HEP in place Goal status: MET   LONG TERM GOALS: Target date: 04/17/2022    Pt will improve BERG by at least 3 points in order to demonstrate clinically significant improvement in balance.   Baseline: 08/21/2021= 48/56 8/24:47/56; 11/13/2021= 47/56,  01/06/2022 = Unable to test due to time constraint- will test next session and update goal; 01/08/2022= 51/56- will keep goal active to ensure consistency.  12/21: 52/56 Goal status: Met  2.  Pt will be independent with final HEP in order to improve strength and balance in order to decrease fall risk and improve function at home and work.  Baseline: No HEP in place, 8/24: completing current HEP but still progressing; 11/13/2021- Patient continues to verbalize and demonstrate understanding of HEP for stretching/strengthening and mobility but still progressing and altering program as appropriate; 11/7: Continues follow current HEP but still progressing HEP as patient progresses 12/21: Been walking and completing core activation exercising, not doing as much balance lately.  Goal status: IN PROGRESS  3.  Pt will decrease 5TSTS by at least 4 seconds in order to demonstrate clinically significant improvement in LE strength. Baseline: 08/21/2021= 19.01 sec without UE support 8/24:10.56 sec Goal status: MET  4.  Pt will decrease TUG to below 14 seconds/decrease in order to demonstrate decreased fall risk. Baseline:  08/21/2021=15.67 with RW 8/24: 12.41 Goal status: MET  5.  Pt will improve FOTO to target score of 56 to display perceived improvements in ability to complete ADL's.  Baseline: 08/21/2021=49 8/24: 45.6; 11/13/2021=46, 01/06/2022 = 45.6 12/21: 46 Goal status: IN PROGRESS  6.  Pt will increase 10MWT by at least 0.15 m/s in order to demonstrate clinically significant improvement in community ambulation.   Baseline: 08/22/2021=0.94 m/s 8/24:1.83ms Goal status: MET  7 Pt will improve miniBESTest by at least 4 points to demonstrate clinically significant improvement in reduced falls with parkinsonian related deficits.  Baseline: 19/28; 11/13/2021- Unable to test due to time constraint- will test next session and update goal. 9/19: 18/28; 01/06/2022 - 23/28 Goal Status: MET   8. Pt will increase 6MWT  by at least [id number] order to demonstrate clinically significant improvement in cardiopulmonary endurance and community ambulation  Baseline: 11/13/2021= 650 feet with walker; 01/06/2022 = 1160 feet with walker.  Goal Status: MET   9.  Patient will improve modified Oswestry disability index by 10% or greater indicating improved functional capacity secondary to low back pain Baseline: 12/28:58% Goal status: INITIAL  10.  Patient will demonstrate improved core stabilization and hip strength as evidenced by ability to maintain bilateral hip flexion with control lowering to plinth with exacerbation of pain Baseline: Able to complete upon reeval, legs immediately dropped due to weakness. Goal status: INITIAL    ASSESSMENT:  CLINICAL IMPRESSION:  Continued with current plan of care as laid out in evaluation and recent prior sessions. Pt remains motivated to advance progress toward goals in order to maximize independence and  safety at home. Pt requires high level assistance and cuing for completion of exercises in order to provide adequate level of stimulation challenge while minimizing pain and discomfort when possible. Pt uses heat on low back with supine interventions to limit his pain and discomfort. Pt closely monitored throughout session pt response and to maximize patient safety during interventions. Pt continues to demonstrate progress toward goals AEB progression of interventions this date either in volume or intensity.  Pt will continue to benefit from skilled physical therapy intervention to address impairments, improve QOL, and attain therapy goals.      OBJECTIVE IMPAIRMENTS Abnormal gait, decreased activity tolerance, decreased balance, decreased coordination, decreased endurance, decreased knowledge of use of DME, decreased mobility, difficulty walking, decreased ROM, decreased strength, hypomobility, and impaired flexibility.   ACTIVITY LIMITATIONS carrying, lifting, bending,  standing, squatting, sleeping, stairs, transfers, continence, bathing, toileting, and dressing  PARTICIPATION LIMITATIONS: cleaning, laundry, medication management, driving, shopping, community activity, and yard work  PERSONAL FACTORS Age are also affecting patient's functional outcome.   REHAB POTENTIAL: Good  CLINICAL DECISION MAKING: Stable/uncomplicated  EVALUATION COMPLEXITY: Low  PLAN: PT FREQUENCY: 2x/week  PT DURATION: 12 weeks  PLANNED INTERVENTIONS: Therapeutic exercises, Therapeutic activity, Neuromuscular re-education, Balance training, Gait training, Patient/Family education, Joint mobilization, Stair training, Vestibular training, Canalith repositioning, DME instructions, Dry Needling, Electrical stimulation, Spinal manipulation, and Spinal mobilization  PLAN FOR NEXT SESSION: continued targeted therapy for low back pain including lumbar and core stabilization exercises and hip strengthening.  [facility]  03/05/22, 1:15 PM

## 2022-03-10 ENCOUNTER — Encounter: Payer: Medicare HMO | Admitting: Speech Pathology

## 2022-03-10 ENCOUNTER — Encounter: Payer: Medicare HMO | Admitting: Occupational Therapy

## 2022-03-10 ENCOUNTER — Ambulatory Visit: Payer: Medicare HMO | Admitting: Physical Therapy

## 2022-03-11 DIAGNOSIS — L298 Other pruritus: Secondary | ICD-10-CM | POA: Diagnosis not present

## 2022-03-11 DIAGNOSIS — B353 Tinea pedis: Secondary | ICD-10-CM | POA: Diagnosis not present

## 2022-03-11 DIAGNOSIS — B351 Tinea unguium: Secondary | ICD-10-CM | POA: Diagnosis not present

## 2022-03-12 ENCOUNTER — Ambulatory Visit: Payer: Medicare HMO

## 2022-03-12 ENCOUNTER — Encounter: Payer: Medicare HMO | Admitting: Speech Pathology

## 2022-03-12 ENCOUNTER — Ambulatory Visit: Payer: Medicare HMO | Admitting: Physical Therapy

## 2022-03-12 DIAGNOSIS — R2681 Unsteadiness on feet: Secondary | ICD-10-CM

## 2022-03-12 DIAGNOSIS — R262 Difficulty in walking, not elsewhere classified: Secondary | ICD-10-CM

## 2022-03-12 DIAGNOSIS — R2689 Other abnormalities of gait and mobility: Secondary | ICD-10-CM | POA: Diagnosis not present

## 2022-03-12 DIAGNOSIS — R269 Unspecified abnormalities of gait and mobility: Secondary | ICD-10-CM | POA: Diagnosis not present

## 2022-03-12 DIAGNOSIS — M6281 Muscle weakness (generalized): Secondary | ICD-10-CM

## 2022-03-12 DIAGNOSIS — M5459 Other low back pain: Secondary | ICD-10-CM | POA: Diagnosis not present

## 2022-03-12 NOTE — Therapy (Signed)
OUTPATIENT PHYSICAL THERAPY NEURO TREATMENT Patient Name: Joseph Arroyo MRN: [id number] DOB:Mar 12, 1940, 82 y.o., male Today's Date: 03/12/2022  PCP: Olin Hauser, DO REFERRING PROVIDER: Lavena Bullion, NP   PT End of Session - 03/12/22 1458     Visit Number 31    Number of Visits 72    Date for PT Re-Evaluation 04/16/22    Authorization Type [employer]    Authorization Time Period -04/16/22    Progress Note Due on Visit 71    PT Start Time 1515    PT Stop Time 1558    PT Time Calculation (min) 43 min    Equipment Utilized During Treatment Gait belt    Activity Tolerance Patient tolerated treatment well;No increased pain    Behavior During Therapy [facility] for tasks assessed/performed                       Past Medical History:  Diagnosis Date   Allergic rhinitis due to pollen 11/21/2007   Brachial neuritis or radiculitis NOS    Cervicalgia    Concussion    age 74 - s/p accident   Costal chondritis    Depression    Essential and other specified forms of tremor    GERD (gastroesophageal reflux disease)    in past   Lumbago    Occlusion and stenosis of carotid artery without mention of cerebral infarction    Seizures [facility])    age 49 - after concussion   Past Surgical History:  Procedure Laterality Date   CATARACT EXTRACTION Right 07/18/14   CATARACT EXTRACTION W/PHACO Left 08/01/2014   Procedure: CATARACT EXTRACTION PHACO AND INTRAOCULAR LENS PLACEMENT ([place]);  Surgeon: Leandrew Koyanagi, MD;  Location: [place];  Service: Ophthalmology;  Laterality: Left;  IVA TOPICAL   COLONOSCOPY  2008   OTHER SURGICAL HISTORY  2002   ear surgery   STAPEDES SURGERY Right 2002   Patient Active Problem List   Diagnosis Date Noted   Parkinson's disease with dyskinesia 01/27/2022   Dysphagia, pharyngeal phase 09/17/2021   Cervicalgia 08/05/2021   Spondylosis without myelopathy or radiculopathy, lumbosacral region 06/24/2021   Lumbar central  spinal stenosis, w/o neurogenic claudication (L4-5) 06/03/2021   Lumbar lateral recess stenosis (Bilateral: L2-3, L4-5) (Right: L3-4) 06/03/2021   Lumbosacral foraminal stenosis (Bilateral: L2-3, L3-4) (Left: L5-S1) 06/03/2021   Lumbar nerve root impingement (Right: L3 at L2-3 & L3-4) 06/03/2021   Ligamentum flavum hypertrophy (L3-4, L4-5) 06/03/2021   Abnormal MRI, lumbar spine (04/23/2021) 04/24/2021   Long term prescription benzodiazepine use (alprazolam) (Xanax) 03/24/2021   Grade 1 Retrolisthesis of L2/L3 (5 mm) and L3/L4 (3 mm) 03/24/2021   Levoscoliosis of lumbar spine (L3-4 apex) 03/24/2021   DDD (degenerative disc disease), lumbosacral 03/24/2021   Lumbosacral facet arthropathy (Left: L3-4, L4-5, and L5-S1) 03/24/2021   Tricompartment osteoarthritis of knee (Left) 03/24/2021   Baker cyst (Left) 03/24/2021   Chronic low back pain (1ry area of Pain) (Bilateral) (R>L) w/o sciatica 03/24/2021   Lumbar facet syndrome (Bilateral) 03/24/2021   Chronic lower extremity pain (2ry area of Pain) (Bilateral) (L>R) 03/24/2021   Lumbosacral radiculitis/sensory radiculopathy at L2 (Bilateral) 03/24/2021   Lumbosacral radiculitis/sensory radiculopathy at L3 (Bilateral) 03/24/2021   Chronic pain syndrome 03/23/2021   Pharmacologic therapy 03/23/2021   Disorder of skeletal system 03/23/2021   Problems influencing health status 03/23/2021   PAD (peripheral artery disease) ([place]) 10/01/2020   GAD (generalized anxiety disorder) 01/13/2018   MDD (major depressive disorder) 01/13/2018  Umbilical hernia 70/35/0093   Chronic knee pain (Left) 08/11/2017   Derangement of medial meniscus, posterior horn (Left) 08/11/2017   Vitamin D insufficiency 03/05/2016   BPH without obstruction/lower urinary tract symptoms 07/08/2015   Chronic fatigue 10/23/2014   Major depression, recurrent, full remission ([place]) 07/26/2013   Unsteady gait 07/26/2013   Orthostatic hypotension 07/26/2013   Depression 07/26/2013    Anxiety 06/23/2013   Chronic anxiety 06/23/2013   Tremor 05/18/2013   Bilateral carotid artery stenosis 08/30/2012    ONSET DATE: 08/11/2021  REFERRING DIAG: Parkinsons Disease  THERAPY DIAG:  Difficulty in walking, not elsewhere classified  Unsteadiness on feet  Abnormality of gait and mobility  Muscle weakness (generalized)  Other abnormalities of gait and mobility  Rationale for Evaluation and Treatment Rehabilitation  SUBJECTIVE: Pt reports similar while still low lumbar spine pain compared to last session. Pt reports no falls or Lob since last date.   Pt accompanied by: spouse  PERTINENT HISTORY: Joseph Arroyo is a 60yoM who is referred to OPPT due to 2 falls in the setting of chronic parkinson's disease. Pt AMB with RW at baseline but is very physically active.   PAIN: Are you having pain? Yes: NPRS scale: 2/10 Pain location: low back Pain description: ache/soreness Aggravating factors: prolonged standing, transfers, walking Relieving factors: rest and medications   PATIENT GOALS  to walk better and be as active as possible. Also to get in/out of cars better.     Therex:  Moist heat in supine for pain relief and for increased blood flow to target muscles via vasodilation   Pt supported hooklye on plinth with large bolster under B knees to improve comfort in lower back region during all supine activities.   TrA contraction, 5 sec holds with tactile feedback utilized by pt by placing fingers over region of contraction 1 x 15 x 5 sec holds  - added march x 10 ea   Supine Clamshell with RTB 2 x 10 x 5 sec holds   Supine march with TrA contraction 2 x 10 ea   Supine Ball squeeze 15 x 5 sec holds with TrA activation cues for equal muscle recruitment between his Les.   SLR unilateral 2 x 7 reps ea LE, consistent checking in for pain but pt just notes difficulty, challenged with TrA contraction simultaneously for core stabilization   Seated on Dynadisc  -  Rows 2 x 15  with GTB   -Marching 2 x 10, began to fel some low back fatigue    Seated thoracic extension with foam roller behind back and with cues for sitting back in chair to prevent low back extension compensation. 15 x 5 sec holds, pt rates as challenging to isolate thoracic extensors but no low back exacerbation   Standing sidestepping with YTB around knees to isolate hip abductors  -x 2 laps in // bars, difficulty keeping L LE in proper alignment requiring cues for this. Also provided with cues to maintain posture to prevent low back compensatory movements.       PATIENT EDUCATION:    Education details: Pt educated throughout session about proper posture and technique with exercises. Improved exercise technique, movement at target joints, use of target muscles after min to mod verbal, visual, tactile cues.  Person educated: Patient Education method: Explanation, Demonstration, Tactile cues, and Verbal cues Education comprehension: verbalized understanding, returned demonstration, verbal cues required, tactile cues required, and needs further education   HOME EXERCISE PROGRAM:No updates to HEP today. Pt to continue  HEP as previously given 02/24/22: introduced to seated PWR! Exercises as part of HEP, step and twist modified to account for low back pain   Access Code: [email or web address] URL: [email or web address] Date: 02/06/2022 Prepared by: Sande Brothers  Exercises - Supine Transversus Abdominis Bracing - Hands on Stomach  - 1 x daily - 7 x weekly - 3 sets - 10 reps - Supine Transversus Abdominis Bracing with Double Leg Fallout  - 1 x daily - 7 x weekly - 3 sets - 10 reps - Supine Hip Adduction Isometric with Ball  - 1 x daily - 7 x weekly - 3 sets - 10 reps   Access Code: [email or web address] URL: [email or web address] Date: 12/09/2021 Prepared by: Amalia Hailey  Exercises - Seated Alternating Side Stretch with Arm Overhead  - 1 x daily - 7 x weekly - 2  sets - 10 reps - Seated Thoracic Extension Arms Overhead  - 1 x daily - 7 x weekly - 2 sets - 10 reps  GOALS: Goals reviewed with patient? Yes  SHORT TERM GOALS: Target date: MET   Pt will be independent with initial HEP in order to improve strength and balance in order to decrease fall risk and improve function at home and work.  Baseline: 08/21/2021-No formal HEP in place Goal status: MET   LONG TERM GOALS: Target date: 04/17/2022    Pt will improve BERG by at least 3 points in order to demonstrate clinically significant improvement in balance.   Baseline: 08/21/2021= 48/56 8/24:47/56; 11/13/2021= 47/56, 01/06/2022 = Unable to test due to time constraint- will test next session and update goal; 01/08/2022= 51/56- will keep goal active to ensure consistency.  12/21: 52/56 Goal status: Met  2.  Pt will be independent with final HEP in order to improve strength and balance in order to decrease fall risk and improve function at home and work.  Baseline: No HEP in place, 8/24: completing current HEP but still progressing; 11/13/2021- Patient continues to verbalize and demonstrate understanding of HEP for stretching/strengthening and mobility but still progressing and altering program as appropriate; 11/7: Continues follow current HEP but still progressing HEP as patient progresses 12/21: Been walking and completing core activation exercising, not doing as much balance lately.  Goal status: IN PROGRESS  3.  Pt will decrease 5TSTS by at least 4 seconds in order to demonstrate clinically significant improvement in LE strength. Baseline: 08/21/2021= 19.01 sec without UE support 8/24:10.56 sec Goal status: MET  4.  Pt will decrease TUG to below 14 seconds/decrease in order to demonstrate decreased fall risk. Baseline:  08/21/2021=15.67 with RW 8/24: 12.41 Goal status: MET  5.  Pt will improve FOTO to target score of 56 to display perceived improvements in ability to complete ADL's.  Baseline:  08/21/2021=49 8/24: 45.6; 11/13/2021=46, 01/06/2022 = 45.6 12/21: 46 Goal status: IN PROGRESS  6.  Pt will increase 10MWT by at least 0.15 m/s in order to demonstrate clinically significant improvement in community ambulation.   Baseline: 08/22/2021=0.94 m/s 8/24:1.47ms Goal status: MET  7 Pt will improve miniBESTest by at least 4 points to demonstrate clinically significant improvement in reduced falls with parkinsonian related deficits.  Baseline: 19/28; 11/13/2021- Unable to test due to time constraint- will test next session and update goal. 9/19: 18/28; 01/06/2022 - 23/28 Goal Status: MET   8. Pt will increase 6MWT by at least [id number] order to demonstrate clinically significant improvement in cardiopulmonary endurance and community ambulation  Baseline: 11/13/2021= 650 feet with walker; 01/06/2022 = 1160  feet with walker.  Goal Status: MET   9.  Patient will improve modified Oswestry disability index by 10% or greater indicating improved functional capacity secondary to low back pain Baseline: 12/28:58% Goal status: INITIAL  10.  Patient will demonstrate improved core stabilization and hip strength as evidenced by ability to maintain bilateral hip flexion with control lowering to plinth with exacerbation of pain Baseline: Able to complete upon reeval, legs immediately dropped due to weakness. Goal status: INITIAL    ASSESSMENT:  CLINICAL IMPRESSION:  Continued with current plan of care as laid out in evaluation and recent prior sessions. Pt remains motivated to advance progress toward goals in order to maximize independence and safety at home. Pt requires high level assistance and cuing for completion of exercises in order to provide adequate level of stimulation challenge while minimizing pain and discomfort when possible. Pt uses heat on low back with supine interventions to limit his pain and discomfort. Pt closely monitored throughout session pt response and to maximize patient  safety during interventions. Pt progresses with several interventions but still has quickness to fatigue when progressing to seated exercises engaging core musculature. Pt continues to demonstrate progress toward goals AEB progression of interventions this date either in volume or intensity.  Pt will continue to benefit from skilled physical therapy intervention to address impairments, improve QOL, and attain therapy goals.      OBJECTIVE IMPAIRMENTS Abnormal gait, decreased activity tolerance, decreased balance, decreased coordination, decreased endurance, decreased knowledge of use of DME, decreased mobility, difficulty walking, decreased ROM, decreased strength, hypomobility, and impaired flexibility.   ACTIVITY LIMITATIONS carrying, lifting, bending, standing, squatting, sleeping, stairs, transfers, continence, bathing, toileting, and dressing  PARTICIPATION LIMITATIONS: cleaning, laundry, medication management, driving, shopping, community activity, and yard work  PERSONAL FACTORS Age are also affecting patient's functional outcome.   REHAB POTENTIAL: Good  CLINICAL DECISION MAKING: Stable/uncomplicated  EVALUATION COMPLEXITY: Low  PLAN: PT FREQUENCY: 2x/week  PT DURATION: 12 weeks  PLANNED INTERVENTIONS: Therapeutic exercises, Therapeutic activity, Neuromuscular re-education, Balance training, Gait training, Patient/Family education, Joint mobilization, Stair training, Vestibular training, Canalith repositioning, DME instructions, Dry Needling, Electrical stimulation, Spinal manipulation, and Spinal mobilization  PLAN FOR NEXT SESSION: continued targeted therapy for low back pain including lumbar and core stabilization exercises and hip strengthening.  [facility]  03/12/22, 3:00 PM

## 2022-03-13 ENCOUNTER — Encounter: Payer: Self-pay | Admitting: Physical Therapy

## 2022-03-17 ENCOUNTER — Encounter: Payer: Medicare HMO | Admitting: Speech Pathology

## 2022-03-17 ENCOUNTER — Encounter: Payer: Medicare HMO | Admitting: Occupational Therapy

## 2022-03-17 ENCOUNTER — Ambulatory Visit: Payer: Medicare HMO | Admitting: Physical Therapy

## 2022-03-17 ENCOUNTER — Encounter: Payer: Self-pay | Admitting: Physical Therapy

## 2022-03-17 DIAGNOSIS — M5459 Other low back pain: Secondary | ICD-10-CM

## 2022-03-17 DIAGNOSIS — R262 Difficulty in walking, not elsewhere classified: Secondary | ICD-10-CM | POA: Diagnosis not present

## 2022-03-17 DIAGNOSIS — R2689 Other abnormalities of gait and mobility: Secondary | ICD-10-CM | POA: Diagnosis not present

## 2022-03-17 DIAGNOSIS — R2681 Unsteadiness on feet: Secondary | ICD-10-CM

## 2022-03-17 DIAGNOSIS — M6281 Muscle weakness (generalized): Secondary | ICD-10-CM | POA: Diagnosis not present

## 2022-03-17 DIAGNOSIS — R269 Unspecified abnormalities of gait and mobility: Secondary | ICD-10-CM | POA: Diagnosis not present

## 2022-03-17 NOTE — Therapy (Signed)
OUTPATIENT PHYSICAL THERAPY NEURO TREATMENT Patient Name: Joseph Arroyo MRN: [id number] DOB:1940-05-21, 82 y.o., male Today's Date: 03/17/2022  PCP: Olin Hauser, DO REFERRING PROVIDER: Lavena Bullion, NP   PT End of Session - 03/17/22 1436     Visit Number 46    Number of Visits 54    Date for PT Re-Evaluation 04/16/22    Authorization Type [employer]    Authorization Time Period -04/16/22    Progress Note Due on Visit 48    PT Start Time 1434    PT Stop Time 1514    PT Time Calculation (min) 40 min    Equipment Utilized During Treatment Gait belt    Activity Tolerance Patient tolerated treatment well;No increased pain    Behavior During Therapy [facility] for tasks assessed/performed                        Past Medical History:  Diagnosis Date   Allergic rhinitis due to pollen 11/21/2007   Brachial neuritis or radiculitis NOS    Cervicalgia    Concussion    age 16 - s/p accident   Costal chondritis    Depression    Essential and other specified forms of tremor    GERD (gastroesophageal reflux disease)    in past   Lumbago    Occlusion and stenosis of carotid artery without mention of cerebral infarction    Seizures [facility])    age 89 - after concussion   Past Surgical History:  Procedure Laterality Date   CATARACT EXTRACTION Right 07/18/14   CATARACT EXTRACTION W/PHACO Left 08/01/2014   Procedure: CATARACT EXTRACTION PHACO AND INTRAOCULAR LENS PLACEMENT ([place]);  Surgeon: Leandrew Koyanagi, MD;  Location: [place];  Service: Ophthalmology;  Laterality: Left;  IVA TOPICAL   COLONOSCOPY  2008   OTHER SURGICAL HISTORY  2002   ear surgery   STAPEDES SURGERY Right 2002   Patient Active Problem List   Diagnosis Date Noted   Parkinson's disease with dyskinesia 01/27/2022   Dysphagia, pharyngeal phase 09/17/2021   Cervicalgia 08/05/2021   Spondylosis without myelopathy or radiculopathy, lumbosacral region 06/24/2021   Lumbar  central spinal stenosis, w/o neurogenic claudication (L4-5) 06/03/2021   Lumbar lateral recess stenosis (Bilateral: L2-3, L4-5) (Right: L3-4) 06/03/2021   Lumbosacral foraminal stenosis (Bilateral: L2-3, L3-4) (Left: L5-S1) 06/03/2021   Lumbar nerve root impingement (Right: L3 at L2-3 & L3-4) 06/03/2021   Ligamentum flavum hypertrophy (L3-4, L4-5) 06/03/2021   Abnormal MRI, lumbar spine (04/23/2021) 04/24/2021   Long term prescription benzodiazepine use (alprazolam) (Xanax) 03/24/2021   Grade 1 Retrolisthesis of L2/L3 (5 mm) and L3/L4 (3 mm) 03/24/2021   Levoscoliosis of lumbar spine (L3-4 apex) 03/24/2021   DDD (degenerative disc disease), lumbosacral 03/24/2021   Lumbosacral facet arthropathy (Left: L3-4, L4-5, and L5-S1) 03/24/2021   Tricompartment osteoarthritis of knee (Left) 03/24/2021   Baker cyst (Left) 03/24/2021   Chronic low back pain (1ry area of Pain) (Bilateral) (R>L) w/o sciatica 03/24/2021   Lumbar facet syndrome (Bilateral) 03/24/2021   Chronic lower extremity pain (2ry area of Pain) (Bilateral) (L>R) 03/24/2021   Lumbosacral radiculitis/sensory radiculopathy at L2 (Bilateral) 03/24/2021   Lumbosacral radiculitis/sensory radiculopathy at L3 (Bilateral) 03/24/2021   Chronic pain syndrome 03/23/2021   Pharmacologic therapy 03/23/2021   Disorder of skeletal system 03/23/2021   Problems influencing health status 03/23/2021   PAD (peripheral artery disease) ([place]) 10/01/2020   GAD (generalized anxiety disorder) 01/13/2018   MDD (major depressive disorder) 01/13/2018  Umbilical hernia 16/12/9602   Chronic knee pain (Left) 08/11/2017   Derangement of medial meniscus, posterior horn (Left) 08/11/2017   Vitamin D insufficiency 03/05/2016   BPH without obstruction/lower urinary tract symptoms 07/08/2015   Chronic fatigue 10/23/2014   Major depression, recurrent, full remission ([place]) 07/26/2013   Unsteady gait 07/26/2013   Orthostatic hypotension 07/26/2013   Depression  07/26/2013   Anxiety 06/23/2013   Chronic anxiety 06/23/2013   Tremor 05/18/2013   Bilateral carotid artery stenosis 08/30/2012    ONSET DATE: 08/11/2021  REFERRING DIAG: Parkinsons Disease  THERAPY DIAG:  Muscle weakness (generalized)  Abnormality of gait and mobility  Unsteadiness on feet  Other low back pain  Rationale for Evaluation and Treatment Rehabilitation  SUBJECTIVE: Pt reports similar low back pain since last session. Pt reports increased shaking over the weekend, pt instructed to inform his doctor of these changes as they may be related to medication dosage and timing.   Pt accompanied by: spouse  PERTINENT HISTORY: Joseph Arroyo is a 82 yo M who is referred to OPPT due to 2 falls in the setting of chronic parkinson's disease. Pt AMB with RW at baseline but is very physically active.   PAIN: Are you having pain? Yes: NPRS scale: 2/10 Pain location: low back Pain description: ache/soreness Aggravating factors: prolonged standing, transfers, walking Relieving factors: rest and medications   PATIENT GOALS  to walk better and be as active as possible. Also to get in/out of cars better.     Therex:  Moist heat in supine for pain relief and for increased blood flow to target muscles via vasodilation   Pt supported hooklye on plinth with large bolster under B knees to improve comfort in lower back region during all supine activities.   TrA contraction, 5 sec holds with tactile feedback utilized by pt by placing fingers over region of contraction 1 x 10 x 5 sec holds   Supine Clamshell with RTB 2 x 10 x 5 sec holds   Supine march with TrA contraction 2 x 10 ea   Supine Ball squeeze 15 x 5 sec holds with TrA activation cues for equal muscle recruitment between his Les.   SLR unilateral 2 x 10 reps ea LE, consistent checking in for pain but pt just notes difficulty, challenged with TrA contraction simultaneously for core stabilization   Seated on Dynadisc   -Marching 2 x 10, cues via tactile feedback to prevent excessive side bending and rotation to maintain balance and shift weight, began to just perform heel raise for initiation of weight shift with good results.    Seated thoracic extension with foam roller behind back and with cues for sitting back in chair to prevent low back extension compensation. 15 x 5 sec holds, pt rates as challenging to isolate thoracic extensors but no low back exacerbation     PATIENT EDUCATION:    Education details: Pt educated throughout session about proper posture and technique with exercises. Improved exercise technique, movement at target joints, use of target muscles after min to mod verbal, visual, tactile cues.  Person educated: Patient Education method: Explanation, Demonstration, Tactile cues, and Verbal cues Education comprehension: verbalized understanding, returned demonstration, verbal cues required, tactile cues required, and needs further education   HOME EXERCISE PROGRAM:No updates to HEP today. Pt to continue HEP as previously given 02/24/22: introduced to seated PWR! Exercises as part of HEP, step and twist modified to account for low back pain   Access Code: [email or web address] URL: [email or web address] Date:  02/06/2022 Prepared by: Sande Brothers  Exercises - Supine Transversus Abdominis Bracing - Hands on Stomach  - 1 x daily - 7 x weekly - 3 sets - 10 reps - Supine Transversus Abdominis Bracing with Double Leg Fallout  - 1 x daily - 7 x weekly - 3 sets - 10 reps - Supine Hip Adduction Isometric with Ball  - 1 x daily - 7 x weekly - 3 sets - 10 reps   Access Code: [email or web address] URL: [email or web address] Date: 12/09/2021 Prepared by: Amalia Hailey  Exercises - Seated Alternating Side Stretch with Arm Overhead  - 1 x daily - 7 x weekly - 2 sets - 10 reps - Seated Thoracic Extension Arms Overhead  - 1 x daily - 7 x weekly - 2 sets - 10 reps  GOALS: Goals  reviewed with patient? Yes  SHORT TERM GOALS: Target date: MET   Pt will be independent with initial HEP in order to improve strength and balance in order to decrease fall risk and improve function at home and work.  Baseline: 08/21/2021-No formal HEP in place Goal status: MET   LONG TERM GOALS: Target date: 04/17/2022    Pt will improve BERG by at least 3 points in order to demonstrate clinically significant improvement in balance.   Baseline: 08/21/2021= 48/56 8/24:47/56; 11/13/2021= 47/56, 01/06/2022 = Unable to test due to time constraint- will test next session and update goal; 01/08/2022= 51/56- will keep goal active to ensure consistency.  12/21: 52/56 Goal status: Met  2.  Pt will be independent with final HEP in order to improve strength and balance in order to decrease fall risk and improve function at home and work.  Baseline: No HEP in place, 8/24: completing current HEP but still progressing; 11/13/2021- Patient continues to verbalize and demonstrate understanding of HEP for stretching/strengthening and mobility but still progressing and altering program as appropriate; 11/7: Continues follow current HEP but still progressing HEP as patient progresses 12/21: Been walking and completing core activation exercising, not doing as much balance lately.  Goal status: IN PROGRESS  3.  Pt will decrease 5TSTS by at least 4 seconds in order to demonstrate clinically significant improvement in LE strength. Baseline: 08/21/2021= 19.01 sec without UE support 8/24:10.56 sec Goal status: MET  4.  Pt will decrease TUG to below 14 seconds/decrease in order to demonstrate decreased fall risk. Baseline:  08/21/2021=15.67 with RW 8/24: 12.41 Goal status: MET  5.  Pt will improve FOTO to target score of 56 to display perceived improvements in ability to complete ADL's.  Baseline: 08/21/2021=49 8/24: 45.6; 11/13/2021=46, 01/06/2022 = 45.6 12/21: 46 Goal status: IN PROGRESS  6.  Pt will increase 10MWT by  at least 0.15 m/s in order to demonstrate clinically significant improvement in community ambulation.   Baseline: 08/22/2021=0.94 m/s 8/24:1.27ms Goal status: MET  7 Pt will improve miniBESTest by at least 4 points to demonstrate clinically significant improvement in reduced falls with parkinsonian related deficits.  Baseline: 19/28; 11/13/2021- Unable to test due to time constraint- will test next session and update goal. 9/19: 18/28; 01/06/2022 - 23/28 Goal Status: MET   8. Pt will increase 6MWT by at least [id number] order to demonstrate clinically significant improvement in cardiopulmonary endurance and community ambulation  Baseline: 11/13/2021= 650 feet with walker; 01/06/2022 = 1160 feet with walker.  Goal Status: MET   9.  Patient will improve modified Oswestry disability index by 10% or greater indicating improved functional capacity secondary to low back pain Baseline:  12/28:58% Goal status: INITIAL  10.  Patient will demonstrate improved core stabilization and hip strength as evidenced by ability to maintain bilateral hip flexion with control lowering to plinth with exacerbation of pain Baseline: Able to complete upon reeval, legs immediately dropped due to weakness. Goal status: INITIAL    ASSESSMENT:  CLINICAL IMPRESSION:  Continued with current plan of care as laid out in evaluation and recent prior sessions. Pt remains motivated to advance progress toward goals in order to maximize independence and safety at home. Pt requires high level assistance and cuing for completion of exercises in order to provide adequate level of stimulation challenge while minimizing pain and discomfort when possible. Pt uses heat on low back with supine interventions to limit his pain and discomfort. Pt closely monitored throughout session pt response and to maximize patient safety during interventions. Pt progresses with several interventions but still has quickness to fatigue when progressing to  seated exercises engaging core musculature. Pt continues to demonstrate progress toward goals AEB progression of interventions this date either in volume or intensity.  Pt will continue to benefit from skilled physical therapy intervention to address impairments, improve QOL, and attain therapy goals.      OBJECTIVE IMPAIRMENTS Abnormal gait, decreased activity tolerance, decreased balance, decreased coordination, decreased endurance, decreased knowledge of use of DME, decreased mobility, difficulty walking, decreased ROM, decreased strength, hypomobility, and impaired flexibility.   ACTIVITY LIMITATIONS carrying, lifting, bending, standing, squatting, sleeping, stairs, transfers, continence, bathing, toileting, and dressing  PARTICIPATION LIMITATIONS: cleaning, laundry, medication management, driving, shopping, community activity, and yard work  PERSONAL FACTORS Age are also affecting patient's functional outcome.   REHAB POTENTIAL: Good  CLINICAL DECISION MAKING: Stable/uncomplicated  EVALUATION COMPLEXITY: Low  PLAN: PT FREQUENCY: 2x/week  PT DURATION: 12 weeks  PLANNED INTERVENTIONS: Therapeutic exercises, Therapeutic activity, Neuromuscular re-education, Balance training, Gait training, Patient/Family education, Joint mobilization, Stair training, Vestibular training, Canalith repositioning, DME instructions, Dry Needling, Electrical stimulation, Spinal manipulation, and Spinal mobilization  PLAN FOR NEXT SESSION: continued targeted therapy for low back pain including lumbar and core stabilization exercises and hip strengthening.  [facility]  03/17/22, 5:05 PM

## 2022-03-19 ENCOUNTER — Ambulatory Visit: Payer: Medicare HMO | Admitting: Physical Therapy

## 2022-03-19 ENCOUNTER — Encounter: Payer: Medicare HMO | Admitting: Speech Pathology

## 2022-03-19 ENCOUNTER — Ambulatory Visit: Payer: Medicare HMO

## 2022-03-19 DIAGNOSIS — M6281 Muscle weakness (generalized): Secondary | ICD-10-CM

## 2022-03-19 DIAGNOSIS — R269 Unspecified abnormalities of gait and mobility: Secondary | ICD-10-CM

## 2022-03-19 DIAGNOSIS — R2689 Other abnormalities of gait and mobility: Secondary | ICD-10-CM | POA: Diagnosis not present

## 2022-03-19 DIAGNOSIS — R262 Difficulty in walking, not elsewhere classified: Secondary | ICD-10-CM | POA: Diagnosis not present

## 2022-03-19 DIAGNOSIS — M5459 Other low back pain: Secondary | ICD-10-CM | POA: Diagnosis not present

## 2022-03-19 DIAGNOSIS — R2681 Unsteadiness on feet: Secondary | ICD-10-CM | POA: Diagnosis not present

## 2022-03-19 NOTE — Therapy (Signed)
OUTPATIENT PHYSICAL THERAPY NEURO TREATMENT Patient Name: Joseph Arroyo MRN: [id number] DOB:[birth year], 82 y.o., male Today's Date: 03/19/2022  PCP: Olin Hauser, DO REFERRING PROVIDER: Lavena Bullion, NP   PT End of Session - 03/19/22 1439     Visit Number 49    Number of Visits 55    Date for PT Re-Evaluation 04/16/22    Authorization Type [employer]    Authorization Time Period -04/16/22    Progress Note Due on Visit 50    PT Start Time 1432    PT Stop Time 1514    PT Time Calculation (min) 42 min    Equipment Utilized During Treatment Gait belt    Activity Tolerance Patient tolerated treatment well;No increased pain    Behavior During Therapy [facility] for tasks assessed/performed             Past Medical History:  Diagnosis Date   Allergic rhinitis due to pollen 11/21/2007   Brachial neuritis or radiculitis NOS    Cervicalgia    Concussion    age 80 - s/p accident   Costal chondritis    Depression    Essential and other specified forms of tremor    GERD (gastroesophageal reflux disease)    in past   Lumbago    Occlusion and stenosis of carotid artery without mention of cerebral infarction    Seizures [facility])    age 89 - after concussion   Past Surgical History:  Procedure Laterality Date   CATARACT EXTRACTION Right 07/18/14   CATARACT EXTRACTION W/PHACO Left 08/01/2014   Procedure: CATARACT EXTRACTION PHACO AND INTRAOCULAR LENS PLACEMENT ([place]);  Surgeon: Leandrew Koyanagi, MD;  Location: [place];  Service: Ophthalmology;  Laterality: Left;  IVA TOPICAL   COLONOSCOPY  2008   OTHER SURGICAL HISTORY  2002   ear surgery   STAPEDES SURGERY Right 2002   Patient Active Problem List   Diagnosis Date Noted   Parkinson's disease with dyskinesia 01/27/2022   Dysphagia, pharyngeal phase 09/17/2021   Cervicalgia 08/05/2021   Spondylosis without myelopathy or radiculopathy, lumbosacral region 06/24/2021   Lumbar central spinal stenosis,  w/o neurogenic claudication (L4-5) 06/03/2021   Lumbar lateral recess stenosis (Bilateral: L2-3, L4-5) (Right: L3-4) 06/03/2021   Lumbosacral foraminal stenosis (Bilateral: L2-3, L3-4) (Left: L5-S1) 06/03/2021   Lumbar nerve root impingement (Right: L3 at L2-3 & L3-4) 06/03/2021   Ligamentum flavum hypertrophy (L3-4, L4-5) 06/03/2021   Abnormal MRI, lumbar spine (04/23/2021) 04/24/2021   Long term prescription benzodiazepine use (alprazolam) (Xanax) 03/24/2021   Grade 1 Retrolisthesis of L2/L3 (5 mm) and L3/L4 (3 mm) 03/24/2021   Levoscoliosis of lumbar spine (L3-4 apex) 03/24/2021   DDD (degenerative disc disease), lumbosacral 03/24/2021   Lumbosacral facet arthropathy (Left: L3-4, L4-5, and L5-S1) 03/24/2021   Tricompartment osteoarthritis of knee (Left) 03/24/2021   Baker cyst (Left) 03/24/2021   Chronic low back pain (1ry area of Pain) (Bilateral) (R>L) w/o sciatica 03/24/2021   Lumbar facet syndrome (Bilateral) 03/24/2021   Chronic lower extremity pain (2ry area of Pain) (Bilateral) (L>R) 03/24/2021   Lumbosacral radiculitis/sensory radiculopathy at L2 (Bilateral) 03/24/2021   Lumbosacral radiculitis/sensory radiculopathy at L3 (Bilateral) 03/24/2021   Chronic pain syndrome 03/23/2021   Pharmacologic therapy 03/23/2021   Disorder of skeletal system 03/23/2021   Problems influencing health status 03/23/2021   PAD (peripheral artery disease) ([place]) 10/01/2020   GAD (generalized anxiety disorder) 01/13/2018   MDD (major depressive disorder) 96/22/2979   Umbilical hernia 89/21/1941   Chronic knee pain (Left) 08/11/2017  Derangement of medial meniscus, posterior horn (Left) 08/11/2017   Vitamin D insufficiency 03/05/2016   BPH without obstruction/lower urinary tract symptoms 07/08/2015   Chronic fatigue 10/23/2014   Major depression, recurrent, full remission ([place]) 07/26/2013   Unsteady gait 07/26/2013   Orthostatic hypotension 07/26/2013   Depression 07/26/2013   Anxiety  06/23/2013   Chronic anxiety 06/23/2013   Tremor 05/18/2013   Bilateral carotid artery stenosis 08/30/2012    ONSET DATE: 08/11/2021  REFERRING DIAG: Parkinsons Disease  THERAPY DIAG:  Muscle weakness (generalized)  Abnormality of gait and mobility  Unsteadiness on feet  Other low back pain  Rationale for Evaluation and Treatment Rehabilitation  SUBJECTIVE: Pt reports similar low back pain since last session. Pt does not report any other changes since previous session. Reports he has been trying to stay warm amongst cold temperatures in area.   Pt accompanied by: spouse  PERTINENT HISTORY: [name] is a 82 yo M who is referred to OPPT due to 2 falls in the setting of chronic parkinson's disease. Pt AMB with RW at baseline but is very physically active.   PAIN: Are you having pain? Yes: NPRS scale: 2/10 Pain location: low back Pain description: ache/soreness Aggravating factors: prolonged standing, transfers, walking Relieving factors: rest and medications   PATIENT GOALS  to walk better and be as active as possible. Also to get in/out of cars better.     Therex:  Moist heat in supine for pain relief and for increased blood flow to target muscles via vasodilation   Pt supported hooklye on plinth with large bolster under B knees to improve comfort in lower back region during all supine activities.   TrA contraction, 5 sec holds with tactile feedback utilized by pt by placing fingers over region of contraction 1 x 15 x 5 sec holds   Supine Clamshell with GTB 2 x 10 x 5 sec holds   Supine march with TrA contraction 2 x 10 ea   Supine Ball squeeze 20 x 5 sec holds with TrA activation cues for equal muscle recruitment between his Les.   SLR unilateral 2 x 10 reps ea LE, consistent checking in for pain but pt just notes difficulty, challenged with TrA contraction simultaneously for core stabilization   Seated with mirror anterior for feedback marching with focus  on postural alignment maintenance throughout.  3 x 10   Seated thoracic extension with foam roller behind back and with cues for sitting back in chair to prevent low back extension compensation. 15 x 5 sec holds, pt rates as challenging to isolate thoracic extensors but no low back exacerbation     PATIENT EDUCATION:    Education details: Pt educated throughout session about proper posture and technique with exercises. Improved exercise technique, movement at target joints, use of target muscles after min to mod verbal, visual, tactile cues.  Person educated: Patient Education method: Explanation, Demonstration, Tactile cues, and Verbal cues Education comprehension: verbalized understanding, returned demonstration, verbal cues required, tactile cues required, and needs further education   HOME EXERCISE PROGRAM:No updates to HEP today. Pt to continue HEP as previously given 02/24/22: introduced to seated PWR! Exercises as part of HEP, step and twist modified to account for low back pain   Access Code: [email or web address] URL: [email or web address] Date: 02/06/2022 Prepared by: Sande Brothers  Exercises - Supine Transversus Abdominis Bracing - Hands on Stomach  - 1 x daily - 7 x weekly - 3 sets - 10 reps - Supine Transversus Abdominis Bracing with  Double Leg Fallout  - 1 x daily - 7 x weekly - 3 sets - 10 reps - Supine Hip Adduction Isometric with Ball  - 1 x daily - 7 x weekly - 3 sets - 10 reps   Access Code: [email or web address] URL: [email or web address] Date: 12/09/2021 Prepared by: Amalia Hailey  Exercises - Seated Alternating Side Stretch with Arm Overhead  - 1 x daily - 7 x weekly - 2 sets - 10 reps - Seated Thoracic Extension Arms Overhead  - 1 x daily - 7 x weekly - 2 sets - 10 reps  GOALS: Goals reviewed with patient? Yes  SHORT TERM GOALS: Target date: MET   Pt will be independent with initial HEP in order to improve strength and balance in order to  decrease fall risk and improve function at home and work.  Baseline: 08/21/2021-No formal HEP in place Goal status: MET   LONG TERM GOALS: Target date: 04/17/2022    Pt will improve BERG by at least 3 points in order to demonstrate clinically significant improvement in balance.   Baseline: 08/21/2021= 48/56 8/24:47/56; 11/13/2021= 47/56, 01/06/2022 = Unable to test due to time constraint- will test next session and update goal; 01/08/2022= 51/56- will keep goal active to ensure consistency.  12/21: 52/56 Goal status: Met  2.  Pt will be independent with final HEP in order to improve strength and balance in order to decrease fall risk and improve function at home and work.  Baseline: No HEP in place, 8/24: completing current HEP but still progressing; 11/13/2021- Patient continues to verbalize and demonstrate understanding of HEP for stretching/strengthening and mobility but still progressing and altering program as appropriate; 11/7: Continues follow current HEP but still progressing HEP as patient progresses 12/21: Been walking and completing core activation exercising, not doing as much balance lately.  Goal status: IN PROGRESS  3.  Pt will decrease 5TSTS by at least 4 seconds in order to demonstrate clinically significant improvement in LE strength. Baseline: 08/21/2021= 19.01 sec without UE support 8/24:10.56 sec Goal status: MET  4.  Pt will decrease TUG to below 14 seconds/decrease in order to demonstrate decreased fall risk. Baseline:  08/21/2021=15.67 with RW 8/24: 12.41 Goal status: MET  5.  Pt will improve FOTO to target score of 56 to display perceived improvements in ability to complete ADL's.  Baseline: 08/21/2021=49 8/24: 45.6; 11/13/2021=46, 01/06/2022 = 45.6 12/21: 46 Goal status: IN PROGRESS  6.  Pt will increase 10MWT by at least 0.15 m/s in order to demonstrate clinically significant improvement in community ambulation.   Baseline: 08/22/2021=0.94 m/s 8/24:1.79ms Goal status:  MET  7 Pt will improve miniBESTest by at least 4 points to demonstrate clinically significant improvement in reduced falls with parkinsonian related deficits.  Baseline: 19/28; 11/13/2021- Unable to test due to time constraint- will test next session and update goal. 9/19: 18/28; 01/06/2022 - 23/28 Goal Status: MET   8. Pt will increase 6MWT by at least [id number] order to demonstrate clinically significant improvement in cardiopulmonary endurance and community ambulation  Baseline: 11/13/2021= 650 feet with walker; 01/06/2022 = 1160 feet with walker.  Goal Status: MET   9.  Patient will improve modified Oswestry disability index by 10% or greater indicating improved functional capacity secondary to low back pain Baseline: 12/28:58% Goal status: INITIAL  10.  Patient will demonstrate improved core stabilization and hip strength as evidenced by ability to maintain bilateral hip flexion with control lowering to plinth with exacerbation of pain Baseline: Able to  complete upon reeval, legs immediately dropped due to weakness. Goal status: INITIAL    ASSESSMENT:  CLINICAL IMPRESSION:  Continued with current plan of care as laid out in evaluation and recent prior sessions. Pt remains motivated to advance progress toward goals in order to maximize independence and safety at home. Pt requires high level assistance and cuing for completion of exercises in order to provide adequate level of stimulation challenge while minimizing pain and discomfort when possible. Pt uses heat on low back with supine interventions to limit his pain and discomfort. Pt closely monitored throughout session pt response and to maximize patient safety during interventions. Pt responds well to intervention while seated and engaging postural muscles this date.  Pt continues to demonstrate progress toward goals AEB progression of interventions this date either in volume or intensity.  Pt will continue to benefit from skilled  physical therapy intervention to address impairments, improve QOL, and attain therapy goals.      OBJECTIVE IMPAIRMENTS Abnormal gait, decreased activity tolerance, decreased balance, decreased coordination, decreased endurance, decreased knowledge of use of DME, decreased mobility, difficulty walking, decreased ROM, decreased strength, hypomobility, and impaired flexibility.   ACTIVITY LIMITATIONS carrying, lifting, bending, standing, squatting, sleeping, stairs, transfers, continence, bathing, toileting, and dressing  PARTICIPATION LIMITATIONS: cleaning, laundry, medication management, driving, shopping, community activity, and yard work  PERSONAL FACTORS Age are also affecting patient's functional outcome.   REHAB POTENTIAL: Good  CLINICAL DECISION MAKING: Stable/uncomplicated  EVALUATION COMPLEXITY: Low  PLAN: PT FREQUENCY: 2x/week  PT DURATION: 12 weeks  PLANNED INTERVENTIONS: Therapeutic exercises, Therapeutic activity, Neuromuscular re-education, Balance training, Gait training, Patient/Family education, Joint mobilization, Stair training, Vestibular training, Canalith repositioning, DME instructions, Dry Needling, Electrical stimulation, Spinal manipulation, and Spinal mobilization  PLAN FOR NEXT SESSION: continued targeted therapy for low back pain including lumbar and core stabilization exercises and hip strengthening.  [facility]  03/19/22, 2:41 PM

## 2022-03-24 ENCOUNTER — Encounter: Payer: Medicare HMO | Admitting: Speech Pathology

## 2022-03-24 ENCOUNTER — Ambulatory Visit: Payer: Medicare HMO | Admitting: Physical Therapy

## 2022-03-24 ENCOUNTER — Encounter: Payer: Medicare HMO | Admitting: Occupational Therapy

## 2022-03-24 DIAGNOSIS — R262 Difficulty in walking, not elsewhere classified: Secondary | ICD-10-CM | POA: Diagnosis not present

## 2022-03-24 DIAGNOSIS — R2681 Unsteadiness on feet: Secondary | ICD-10-CM

## 2022-03-24 DIAGNOSIS — M6281 Muscle weakness (generalized): Secondary | ICD-10-CM | POA: Diagnosis not present

## 2022-03-24 DIAGNOSIS — R269 Unspecified abnormalities of gait and mobility: Secondary | ICD-10-CM | POA: Diagnosis not present

## 2022-03-24 DIAGNOSIS — M5459 Other low back pain: Secondary | ICD-10-CM

## 2022-03-24 DIAGNOSIS — R2689 Other abnormalities of gait and mobility: Secondary | ICD-10-CM | POA: Diagnosis not present

## 2022-03-24 NOTE — Therapy (Unsigned)
OUTPATIENT PHYSICAL THERAPY NEURO TREATMENT Patient Name: Joseph Arroyo MRN: [id number] DOB:[birth year], 82 y.o., male Today's Date: 03/24/2022  PCP: Olin Hauser, DO REFERRING PROVIDER: Lavena Bullion, NP   PT End of Session - 03/24/22 1439     Visit Number 51    Number of Visits 62    Date for PT Re-Evaluation 04/16/22    Authorization Type [employer]    Authorization Time Period -04/16/22    Progress Note Due on Visit 67    PT Start Time 1433    PT Stop Time 1514    PT Time Calculation (min) 41 min    Equipment Utilized During Treatment Gait belt    Activity Tolerance Patient tolerated treatment well;No increased pain    Behavior During Therapy [facility] for tasks assessed/performed             Past Medical History:  Diagnosis Date   Allergic rhinitis due to pollen 11/21/2007   Brachial neuritis or radiculitis NOS    Cervicalgia    Concussion    age 57 - s/p accident   Costal chondritis    Depression    Essential and other specified forms of tremor    GERD (gastroesophageal reflux disease)    in past   Lumbago    Occlusion and stenosis of carotid artery without mention of cerebral infarction    Seizures [facility])    age 59 - after concussion   Past Surgical History:  Procedure Laterality Date   CATARACT EXTRACTION Right 07/18/14   CATARACT EXTRACTION W/PHACO Left 08/01/2014   Procedure: CATARACT EXTRACTION PHACO AND INTRAOCULAR LENS PLACEMENT ([place]);  Surgeon: Leandrew Koyanagi, MD;  Location: [place];  Service: Ophthalmology;  Laterality: Left;  IVA TOPICAL   COLONOSCOPY  2008   OTHER SURGICAL HISTORY  2002   ear surgery   STAPEDES SURGERY Right 2002   Patient Active Problem List   Diagnosis Date Noted   Parkinson's disease with dyskinesia 01/27/2022   Dysphagia, pharyngeal phase 09/17/2021   Cervicalgia 08/05/2021   Spondylosis without myelopathy or radiculopathy, lumbosacral region 06/24/2021   Lumbar central spinal stenosis,  w/o neurogenic claudication (L4-5) 06/03/2021   Lumbar lateral recess stenosis (Bilateral: L2-3, L4-5) (Right: L3-4) 06/03/2021   Lumbosacral foraminal stenosis (Bilateral: L2-3, L3-4) (Left: L5-S1) 06/03/2021   Lumbar nerve root impingement (Right: L3 at L2-3 & L3-4) 06/03/2021   Ligamentum flavum hypertrophy (L3-4, L4-5) 06/03/2021   Abnormal MRI, lumbar spine (04/23/2021) 04/24/2021   Long term prescription benzodiazepine use (alprazolam) (Xanax) 03/24/2021   Grade 1 Retrolisthesis of L2/L3 (5 mm) and L3/L4 (3 mm) 03/24/2021   Levoscoliosis of lumbar spine (L3-4 apex) 03/24/2021   DDD (degenerative disc disease), lumbosacral 03/24/2021   Lumbosacral facet arthropathy (Left: L3-4, L4-5, and L5-S1) 03/24/2021   Tricompartment osteoarthritis of knee (Left) 03/24/2021   Baker cyst (Left) 03/24/2021   Chronic low back pain (1ry area of Pain) (Bilateral) (R>L) w/o sciatica 03/24/2021   Lumbar facet syndrome (Bilateral) 03/24/2021   Chronic lower extremity pain (2ry area of Pain) (Bilateral) (L>R) 03/24/2021   Lumbosacral radiculitis/sensory radiculopathy at L2 (Bilateral) 03/24/2021   Lumbosacral radiculitis/sensory radiculopathy at L3 (Bilateral) 03/24/2021   Chronic pain syndrome 03/23/2021   Pharmacologic therapy 03/23/2021   Disorder of skeletal system 03/23/2021   Problems influencing health status 03/23/2021   PAD (peripheral artery disease) ([place]) 10/01/2020   GAD (generalized anxiety disorder) 01/13/2018   MDD (major depressive disorder) 95/28/4132   Umbilical hernia 44/03/270   Chronic knee pain (Left) 08/11/2017  Derangement of medial meniscus, posterior horn (Left) 08/11/2017   Vitamin D insufficiency 03/05/2016   BPH without obstruction/lower urinary tract symptoms 07/08/2015   Chronic fatigue 10/23/2014   Major depression, recurrent, full remission ([place]) 07/26/2013   Unsteady gait 07/26/2013   Orthostatic hypotension 07/26/2013   Depression 07/26/2013   Anxiety  06/23/2013   Chronic anxiety 06/23/2013   Tremor 05/18/2013   Bilateral carotid artery stenosis 08/30/2012    ONSET DATE: 08/11/2021  REFERRING DIAG: Parkinsons Disease  THERAPY DIAG:  Muscle weakness (generalized)  Abnormality of gait and mobility  Unsteadiness on feet  Other low back pain  Difficulty in walking, not elsewhere classified  Rationale for Evaluation and Treatment Rehabilitation  SUBJECTIVE: Pt reports similar low back pain since last session. Pt does not report any other changes since previous session. Reports he has been trying to stay warm amongst cold temperatures in area.   Pt accompanied by: spouse  PERTINENT HISTORY: Joseph Arroyo is a 82 yo M who is referred to OPPT due to 2 falls in the setting of chronic parkinson's disease. Pt AMB with RW at baseline but is very physically active.   PAIN: Are you having pain? Yes: NPRS scale: 2/10 Pain location: low back Pain description: ache/soreness Aggravating factors: prolonged standing, transfers, walking Relieving factors: rest and medications   PATIENT GOALS  to walk better and be as active as possible. Also to get in/out of cars better.     Therex:  Moist heat in supine for pain relief and for increased blood flow to target muscles via vasodilation   Pt supported hooklye on plinth with large bolster under B knees to improve comfort in lower back region during all supine activities.   TrA contraction, 5 sec holds with tactile feedback utilized by pt by placing fingers over region of contraction 1 x 15 x 5 sec holds   Supine Clamshell with GTB 2 x 10 x 5 sec holds   Supine march with TrA contraction 2 x 10 ea   Supine Ball squeeze 20 x 5 sec holds with TrA activation cues for equal muscle recruitment between his Les.   SLR unilateral 2 x 10 reps ea LE, consistent checking in for pain but pt just notes difficulty, challenged with TrA contraction simultaneously for core stabilization   Seated  with mirror anterior for feedback marching with focus on postural alignment maintenance throughout.  3 x 10   Seated thoracic extension with foam roller behind back and with cues for sitting back in chair to prevent low back extension compensation. 15 x 5 sec holds, pt rates as challenging to isolate thoracic extensors but no low back exacerbation     PATIENT EDUCATION:    Education details: Pt educated throughout session about proper posture and technique with exercises. Improved exercise technique, movement at target joints, use of target muscles after min to mod verbal, visual, tactile cues.  Person educated: Patient Education method: Explanation, Demonstration, Tactile cues, and Verbal cues Education comprehension: verbalized understanding, returned demonstration, verbal cues required, tactile cues required, and needs further education   HOME EXERCISE PROGRAM:No updates to HEP today. Pt to continue HEP as previously given 02/24/22: introduced to seated PWR! Exercises as part of HEP, step and twist modified to account for low back pain   Access Code: [email or web address] URL: [email or web address] Date: 02/06/2022 Prepared by: Sande Brothers  Exercises - Supine Transversus Abdominis Bracing - Hands on Stomach  - 1 x daily - 7 x weekly - 3 sets - 10  reps - Supine Transversus Abdominis Bracing with Double Leg Fallout  - 1 x daily - 7 x weekly - 3 sets - 10 reps - Supine Hip Adduction Isometric with Ball  - 1 x daily - 7 x weekly - 3 sets - 10 reps   Access Code: [email or web address] URL: [email or web address] Date: 12/09/2021 Prepared by: Amalia Hailey  Exercises - Seated Alternating Side Stretch with Arm Overhead  - 1 x daily - 7 x weekly - 2 sets - 10 reps - Seated Thoracic Extension Arms Overhead  - 1 x daily - 7 x weekly - 2 sets - 10 reps  GOALS: Goals reviewed with patient? Yes  SHORT TERM GOALS: Target date: MET   Pt will be independent with initial HEP in  order to improve strength and balance in order to decrease fall risk and improve function at home and work.  Baseline: 08/21/2021-No formal HEP in place Goal status: MET   LONG TERM GOALS: Target date: 04/17/2022    Pt will improve BERG by at least 3 points in order to demonstrate clinically significant improvement in balance.   Baseline: 08/21/2021= 48/56 8/24:47/56; 11/13/2021= 47/56, 01/06/2022 = Unable to test due to time constraint- will test next session and update goal; 01/08/2022= 51/56- will keep goal active to ensure consistency.  12/21: 52/56 Goal status: Met  2.  Pt will be independent with final HEP in order to improve strength and balance in order to decrease fall risk and improve function at home and work.  Baseline: No HEP in place, 8/24: completing current HEP but still progressing; 11/13/2021- Patient continues to verbalize and demonstrate understanding of HEP for stretching/strengthening and mobility but still progressing and altering program as appropriate; 11/7: Continues follow current HEP but still progressing HEP as patient progresses 12/21: Been walking and completing core activation exercising, not doing as much balance lately.  Goal status: IN PROGRESS  3.  Pt will decrease 5TSTS by at least 4 seconds in order to demonstrate clinically significant improvement in LE strength. Baseline: 08/21/2021= 19.01 sec without UE support 8/24:10.56 sec Goal status: MET  4.  Pt will decrease TUG to below 14 seconds/decrease in order to demonstrate decreased fall risk. Baseline:  08/21/2021=15.67 with RW 8/24: 12.41 Goal status: MET  5.  Pt will improve FOTO to target score of 56 to display perceived improvements in ability to complete ADL's.  Baseline: 08/21/2021=49 8/24: 45.6; 11/13/2021=46, 01/06/2022 = 45.6 12/21: 46 Goal status: IN PROGRESS  6.  Pt will increase 10MWT by at least 0.15 m/s in order to demonstrate clinically significant improvement in community ambulation.    Baseline: 08/22/2021=0.94 m/s 8/24:1.41ms Goal status: MET  7 Pt will improve miniBESTest by at least 4 points to demonstrate clinically significant improvement in reduced falls with parkinsonian related deficits.  Baseline: 19/28; 11/13/2021- Unable to test due to time constraint- will test next session and update goal. 9/19: 18/28; 01/06/2022 - 23/28 Goal Status: MET   8. Pt will increase 6MWT by at least [id number] order to demonstrate clinically significant improvement in cardiopulmonary endurance and community ambulation  Baseline: 11/13/2021= 650 feet with walker; 01/06/2022 = 1160 feet with walker.  Goal Status: MET   9.  Patient will improve modified Oswestry disability index by 10% or greater indicating improved functional capacity secondary to low back pain Baseline: 12/28:58% Goal status: INITIAL  10.  Patient will demonstrate improved core stabilization and hip strength as evidenced by ability to maintain bilateral hip flexion with control lowering to plinth  with exacerbation of pain Baseline: Able to complete upon reeval, legs immediately dropped due to weakness. Goal status: INITIAL    ASSESSMENT:  CLINICAL IMPRESSION:  Continued with current plan of care as laid out in evaluation and recent prior sessions. Pt remains motivated to advance progress toward goals in order to maximize independence and safety at home. Pt requires high level assistance and cuing for completion of exercises in order to provide adequate level of stimulation challenge while minimizing pain and discomfort when possible. Pt uses heat on low back with supine interventions to limit his pain and discomfort. Pt closely monitored throughout session pt response and to maximize patient safety during interventions. Pt responds well to intervention while seated and engaging postural muscles this date.  Pt continues to demonstrate progress toward goals AEB progression of interventions this date either in volume  or intensity.  Pt will continue to benefit from skilled physical therapy intervention to address impairments, improve QOL, and attain therapy goals.      OBJECTIVE IMPAIRMENTS Abnormal gait, decreased activity tolerance, decreased balance, decreased coordination, decreased endurance, decreased knowledge of use of DME, decreased mobility, difficulty walking, decreased ROM, decreased strength, hypomobility, and impaired flexibility.   ACTIVITY LIMITATIONS carrying, lifting, bending, standing, squatting, sleeping, stairs, transfers, continence, bathing, toileting, and dressing  PARTICIPATION LIMITATIONS: cleaning, laundry, medication management, driving, shopping, community activity, and yard work  PERSONAL FACTORS Age are also affecting patient's functional outcome.   REHAB POTENTIAL: Good  CLINICAL DECISION MAKING: Stable/uncomplicated  EVALUATION COMPLEXITY: Low  PLAN: PT FREQUENCY: 2x/week  PT DURATION: 12 weeks  PLANNED INTERVENTIONS: Therapeutic exercises, Therapeutic activity, Neuromuscular re-education, Balance training, Gait training, Patient/Family education, Joint mobilization, Stair training, Vestibular training, Canalith repositioning, DME instructions, Dry Needling, Electrical stimulation, Spinal manipulation, and Spinal mobilization  PLAN FOR NEXT SESSION: continued targeted therapy for low back pain including lumbar and core stabilization exercises and hip strengthening.  [facility]  03/24/22, 2:41 PM

## 2022-03-25 ENCOUNTER — Encounter: Payer: Self-pay | Admitting: Physical Therapy

## 2022-03-26 ENCOUNTER — Encounter: Payer: Self-pay | Admitting: Physical Therapy

## 2022-03-26 ENCOUNTER — Encounter: Payer: Medicare HMO | Admitting: Speech Pathology

## 2022-03-26 ENCOUNTER — Ambulatory Visit: Payer: Medicare HMO

## 2022-03-26 ENCOUNTER — Ambulatory Visit: Payer: Medicare HMO | Admitting: Physical Therapy

## 2022-03-26 DIAGNOSIS — M5459 Other low back pain: Secondary | ICD-10-CM | POA: Diagnosis not present

## 2022-03-26 DIAGNOSIS — R269 Unspecified abnormalities of gait and mobility: Secondary | ICD-10-CM | POA: Diagnosis not present

## 2022-03-26 DIAGNOSIS — R262 Difficulty in walking, not elsewhere classified: Secondary | ICD-10-CM

## 2022-03-26 DIAGNOSIS — M6281 Muscle weakness (generalized): Secondary | ICD-10-CM | POA: Diagnosis not present

## 2022-03-26 DIAGNOSIS — R2689 Other abnormalities of gait and mobility: Secondary | ICD-10-CM | POA: Diagnosis not present

## 2022-03-26 DIAGNOSIS — R2681 Unsteadiness on feet: Secondary | ICD-10-CM | POA: Diagnosis not present

## 2022-03-26 NOTE — Therapy (Signed)
OUTPATIENT PHYSICAL THERAPY NEURO TREATMENT Patient Name: Joseph Arroyo MRN: [id number] DOB:11-08-1940, 82 y.o., male Today's Date: 03/26/2022  PCP: Joseph Hauser, DO REFERRING PROVIDER: Lavena Bullion, NP   PT End of Session - 03/26/22 1545     Visit Number 27    Number of Visits 17    Date for PT Re-Evaluation 04/16/22    Authorization Type [employer]    Authorization Time Period -04/16/22    Progress Note Due on Visit 26    PT Start Time 0235    PT Stop Time 0315    PT Time Calculation (min) 40 min    Equipment Utilized During Treatment Gait belt    Activity Tolerance Patient tolerated treatment well;No increased pain    Behavior During Therapy [facility] for tasks assessed/performed              Past Medical History:  Diagnosis Date   Allergic rhinitis due to pollen 11/21/2007   Brachial neuritis or radiculitis NOS    Cervicalgia    Concussion    age 3 - s/p accident   Costal chondritis    Depression    Essential and other specified forms of tremor    GERD (gastroesophageal reflux disease)    in past   Lumbago    Occlusion and stenosis of carotid artery without mention of cerebral infarction    Seizures [facility])    age 55 - after concussion   Past Surgical History:  Procedure Laterality Date   CATARACT EXTRACTION Right 07/18/14   CATARACT EXTRACTION W/PHACO Left 08/01/2014   Procedure: CATARACT EXTRACTION PHACO AND INTRAOCULAR LENS PLACEMENT ([place]);  Surgeon: Joseph Koyanagi, MD;  Location: [place];  Service: Ophthalmology;  Laterality: Left;  IVA TOPICAL   COLONOSCOPY  2008   OTHER SURGICAL HISTORY  2002   ear surgery   STAPEDES SURGERY Right 2002   Patient Active Problem List   Diagnosis Date Noted   Parkinson's disease with dyskinesia 01/27/2022   Dysphagia, pharyngeal phase 09/17/2021   Cervicalgia 08/05/2021   Spondylosis without myelopathy or radiculopathy, lumbosacral region 06/24/2021   Lumbar central spinal stenosis,  w/o neurogenic claudication (L4-5) 06/03/2021   Lumbar lateral recess stenosis (Bilateral: L2-3, L4-5) (Right: L3-4) 06/03/2021   Lumbosacral foraminal stenosis (Bilateral: L2-3, L3-4) (Left: L5-S1) 06/03/2021   Lumbar nerve root impingement (Right: L3 at L2-3 & L3-4) 06/03/2021   Ligamentum flavum hypertrophy (L3-4, L4-5) 06/03/2021   Abnormal MRI, lumbar spine (04/23/2021) 04/24/2021   Long term prescription benzodiazepine use (alprazolam) (Xanax) 03/24/2021   Grade 1 Retrolisthesis of L2/L3 (5 mm) and L3/L4 (3 mm) 03/24/2021   Levoscoliosis of lumbar spine (L3-4 apex) 03/24/2021   DDD (degenerative disc disease), lumbosacral 03/24/2021   Lumbosacral facet arthropathy (Left: L3-4, L4-5, and L5-S1) 03/24/2021   Tricompartment osteoarthritis of knee (Left) 03/24/2021   Baker cyst (Left) 03/24/2021   Chronic low back pain (1ry area of Pain) (Bilateral) (R>L) w/o sciatica 03/24/2021   Lumbar facet syndrome (Bilateral) 03/24/2021   Chronic lower extremity pain (2ry area of Pain) (Bilateral) (L>R) 03/24/2021   Lumbosacral radiculitis/sensory radiculopathy at L2 (Bilateral) 03/24/2021   Lumbosacral radiculitis/sensory radiculopathy at L3 (Bilateral) 03/24/2021   Chronic pain syndrome 03/23/2021   Pharmacologic therapy 03/23/2021   Disorder of skeletal system 03/23/2021   Problems influencing health status 03/23/2021   PAD (peripheral artery disease) ([place]) 10/01/2020   GAD (generalized anxiety disorder) 01/13/2018   MDD (major depressive disorder) 75/64/3329   Umbilical hernia 51/88/4166   Chronic knee pain (Left)  08/11/2017   Derangement of medial meniscus, posterior horn (Left) 08/11/2017   Vitamin D insufficiency 03/05/2016   BPH without obstruction/lower urinary tract symptoms 07/08/2015   Chronic fatigue 10/23/2014   Major depression, recurrent, full remission ([place]) 07/26/2013   Unsteady gait 07/26/2013   Orthostatic hypotension 07/26/2013   Depression 07/26/2013   Anxiety  06/23/2013   Chronic anxiety 06/23/2013   Tremor 05/18/2013   Bilateral carotid artery stenosis 08/30/2012    ONSET DATE: 08/11/2021  REFERRING DIAG: Parkinsons Disease  THERAPY DIAG:  Muscle weakness (generalized)  Other low back pain  Difficulty in walking, not elsewhere classified  Other abnormalities of gait and mobility  Rationale for Evaluation and Treatment Rehabilitation  SUBJECTIVE: Pt reports similar low back pain since last session. Pt does not report any other changes since previous session.  Pt accompanied by: spouse  PERTINENT HISTORY: Joseph Arroyo is a 82 yo M who is referred to OPPT due to 2 falls in the setting of chronic parkinson's disease. Pt AMB with RW at baseline but is very physically active.   PAIN: Are you having pain? Yes: NPRS scale: 2/10 Pain location: low back Pain description: ache/soreness Aggravating factors: prolonged standing, transfers, walking Relieving factors: rest and medications   PATIENT GOALS  to walk better and be as active as possible. Also to get in/out of cars better.     Therex:   Seated with mirror anterior for feedback marching with focus on postural alignment maintenance throughout.  Marching  3 x 10   Moist heat in supine for pain relief and for increased blood flow to target muscles via vasodilation   Supine glute set with adductor squeeze 2 x 15 reps to activate core musculature   Supine Clamshell with GTB 2 x 10 x 5 sec holds   Supine modified dead bug ( x 5 ea LE) with ball on torso for stability ( p ball)  -no pain noted   Pt requires multiple positional changes for postural alignment and he reports pain improvement with these changes.   Further education and practice with logroll technique educating patient to use contralateral upper extremity for pushoff as well as his momentum of lower extremities in order to keep proper alignment and lumbar spine to prevent compensatory movements and onset of  pain. Required a couple attempts but improved form with practice.   PATIENT EDUCATION:    Education details: Pt educated throughout session about proper posture and technique with exercises. Improved exercise technique, movement at target joints, use of target muscles after min to mod verbal, visual, tactile cues.  Person educated: Patient Education method: Explanation, Demonstration, Tactile cues, and Verbal cues Education comprehension: verbalized understanding, returned demonstration, verbal cues required, tactile cues required, and needs further education   HOME EXERCISE PROGRAM:No updates to HEP today. Pt to continue HEP as previously given 02/24/22: introduced to seated PWR! Exercises as part of HEP, step and twist modified to account for low back pain   Access Code: [email or web address] URL: [email or web address] Date: 02/06/2022 Prepared by: Sande Brothers  Exercises - Supine Transversus Abdominis Bracing - Hands on Stomach  - 1 x daily - 7 x weekly - 3 sets - 10 reps - Supine Transversus Abdominis Bracing with Double Leg Fallout  - 1 x daily - 7 x weekly - 3 sets - 10 reps - Supine Hip Adduction Isometric with Ball  - 1 x daily - 7 x weekly - 3 sets - 10 reps   Access Code: [email or web address] URL: [email or web address] Date: 12/09/2021  Prepared by: Amalia Hailey  Exercises - Seated Alternating Side Stretch with Arm Overhead  - 1 x daily - 7 x weekly - 2 sets - 10 reps - Seated Thoracic Extension Arms Overhead  - 1 x daily - 7 x weekly - 2 sets - 10 reps  GOALS: Goals reviewed with patient? Yes  SHORT TERM GOALS: Target date: MET   Pt will be independent with initial HEP in order to improve strength and balance in order to decrease fall risk and improve function at home and work.  Baseline: 08/21/2021-No formal HEP in place Goal status: MET   LONG TERM GOALS: Target date: 04/17/2022    Pt will improve BERG by at least 3 points in order to demonstrate  clinically significant improvement in balance.   Baseline: 08/21/2021= 48/56 8/24:47/56; 11/13/2021= 47/56, 01/06/2022 = Unable to test due to time constraint- will test next session and update goal; 01/08/2022= 51/56- will keep goal active to ensure consistency.  12/21: 52/56 Goal status: Met  2.  Pt will be independent with final HEP in order to improve strength and balance in order to decrease fall risk and improve function at home and work.  Baseline: No HEP in place, 8/24: completing current HEP but still progressing; 11/13/2021- Patient continues to verbalize and demonstrate understanding of HEP for stretching/strengthening and mobility but still progressing and altering program as appropriate; 11/7: Continues follow current HEP but still progressing HEP as patient progresses 12/21: Been walking and completing core activation exercising, not doing as much balance lately.  Goal status: IN PROGRESS  3.  Pt will decrease 5TSTS by at least 4 seconds in order to demonstrate clinically significant improvement in LE strength. Baseline: 08/21/2021= 19.01 sec without UE support 8/24:10.56 sec Goal status: MET  4.  Pt will decrease TUG to below 14 seconds/decrease in order to demonstrate decreased fall risk. Baseline:  08/21/2021=15.67 with RW 8/24: 12.41 Goal status: MET  5.  Pt will improve FOTO to target score of 56 to display perceived improvements in ability to complete ADL's.  Baseline: 08/21/2021=49 8/24: 45.6; 11/13/2021=46, 01/06/2022 = 45.6 12/21: 46 Goal status: IN PROGRESS  6.  Pt will increase 10MWT by at least 0.15 m/s in order to demonstrate clinically significant improvement in community ambulation.   Baseline: 08/22/2021=0.94 m/s 8/24:1.50ms Goal status: MET  7 Pt will improve miniBESTest by at least 4 points to demonstrate clinically significant improvement in reduced falls with parkinsonian related deficits.  Baseline: 19/28; 11/13/2021- Unable to test due to time constraint- will test  next session and update goal. 9/19: 18/28; 01/06/2022 - 23/28 Goal Status: MET   8. Pt will increase 6MWT by at least [id number] order to demonstrate clinically significant improvement in cardiopulmonary endurance and community ambulation  Baseline: 11/13/2021= 650 feet with walker; 01/06/2022 = 1160 feet with walker.  Goal Status: MET   9.  Patient will improve modified Oswestry disability index by 10% or greater indicating improved functional capacity secondary to low back pain Baseline: 12/28:58% Goal status: INITIAL  10.  Patient will demonstrate improved core stabilization and hip strength as evidenced by ability to maintain bilateral hip flexion with control lowering to plinth with exacerbation of pain Baseline: Able to complete upon reeval, legs immediately dropped due to weakness. Goal status: INITIAL    ASSESSMENT:  CLINICAL IMPRESSION:  Continued with current plan of care as laid out in evaluation and recent prior sessions. Pt remains motivated to advance progress toward goals in order to maximize independence and  safety at home. Pt requires high level assistance and cuing for completion of exercises in order to provide adequate level of stimulation challenge while minimizing pain and discomfort when possible. Pt uses heat on low back with supine interventions to limit his pain and discomfort.  Pt closely monitored throughout session pt response and to maximize patient safety during interventions. Pt responds well to intervention while seated with mirror anterior and engaging postural muscles this date.  Pt will continue to benefit from skilled physical therapy intervention to address impairments, improve QOL, and attain therapy goals.      OBJECTIVE IMPAIRMENTS Abnormal gait, decreased activity tolerance, decreased balance, decreased coordination, decreased endurance, decreased knowledge of use of DME, decreased mobility, difficulty walking, decreased ROM, decreased strength,  hypomobility, and impaired flexibility.   ACTIVITY LIMITATIONS carrying, lifting, bending, standing, squatting, sleeping, stairs, transfers, continence, bathing, toileting, and dressing  PARTICIPATION LIMITATIONS: cleaning, laundry, medication management, driving, shopping, community activity, and yard work  PERSONAL FACTORS Age are also affecting patient's functional outcome.   REHAB POTENTIAL: Good  CLINICAL DECISION MAKING: Stable/uncomplicated  EVALUATION COMPLEXITY: Low  PLAN: PT FREQUENCY: 2x/week  PT DURATION: 12 weeks  PLANNED INTERVENTIONS: Therapeutic exercises, Therapeutic activity, Neuromuscular re-education, Balance training, Gait training, Patient/Family education, Joint mobilization, Stair training, Vestibular training, Canalith repositioning, DME instructions, Dry Needling, Electrical stimulation, Spinal manipulation, and Spinal mobilization  PLAN FOR NEXT SESSION: continued targeted therapy for low back pain including lumbar and core stabilization exercises and hip strengthening.  [facility]  03/26/22, 3:46 PM

## 2022-03-31 ENCOUNTER — Ambulatory Visit: Payer: Medicare HMO | Admitting: Physical Therapy

## 2022-03-31 DIAGNOSIS — M6281 Muscle weakness (generalized): Secondary | ICD-10-CM

## 2022-03-31 DIAGNOSIS — R269 Unspecified abnormalities of gait and mobility: Secondary | ICD-10-CM | POA: Diagnosis not present

## 2022-03-31 DIAGNOSIS — R2689 Other abnormalities of gait and mobility: Secondary | ICD-10-CM | POA: Diagnosis not present

## 2022-03-31 DIAGNOSIS — M5459 Other low back pain: Secondary | ICD-10-CM

## 2022-03-31 DIAGNOSIS — R2681 Unsteadiness on feet: Secondary | ICD-10-CM | POA: Diagnosis not present

## 2022-03-31 DIAGNOSIS — R262 Difficulty in walking, not elsewhere classified: Secondary | ICD-10-CM | POA: Diagnosis not present

## 2022-03-31 NOTE — Therapy (Unsigned)
OUTPATIENT PHYSICAL THERAPY NEURO TREATMENT /Physical Therapy Progress Note   Dates of reporting period  02/19/22   to   03/31/22  Patient Name: Joseph Arroyo MRN: [id number] DOB:1940-11-23, 82 y.o., male Today's Date: 04/01/2022  PCP: Olin Hauser, DO REFERRING PROVIDER: Lavena Bullion, NP   PT End of Session - 03/31/22 1436     Visit Number 50    Number of Visits 73    Date for PT Re-Evaluation 04/16/22    Authorization Type [employer]    Authorization Time Period -04/16/22    Progress Note Due on Visit 29    PT Start Time 0233    PT Stop Time 0314    PT Time Calculation (min) 41 min    Equipment Utilized During Treatment Gait belt    Activity Tolerance Patient tolerated treatment well;No increased pain    Behavior During Therapy [facility] for tasks assessed/performed               Past Medical History:  Diagnosis Date   Allergic rhinitis due to pollen 11/21/2007   Brachial neuritis or radiculitis NOS    Cervicalgia    Concussion    age 48 - s/p accident   Costal chondritis    Depression    Essential and other specified forms of tremor    GERD (gastroesophageal reflux disease)    in past   Lumbago    Occlusion and stenosis of carotid artery without mention of cerebral infarction    Seizures [facility])    age 68 - after concussion   Past Surgical History:  Procedure Laterality Date   CATARACT EXTRACTION Right 07/18/14   CATARACT EXTRACTION W/PHACO Left 08/01/2014   Procedure: CATARACT EXTRACTION PHACO AND INTRAOCULAR LENS PLACEMENT ([place]);  Surgeon: Leandrew Koyanagi, MD;  Location: [place];  Service: Ophthalmology;  Laterality: Left;  IVA TOPICAL   COLONOSCOPY  2008   OTHER SURGICAL HISTORY  2002   ear surgery   STAPEDES SURGERY Right 2002   Patient Active Problem List   Diagnosis Date Noted   Parkinson's disease with dyskinesia 01/27/2022   Dysphagia, pharyngeal phase 09/17/2021   Cervicalgia 08/05/2021   Spondylosis without  myelopathy or radiculopathy, lumbosacral region 06/24/2021   Lumbar central spinal stenosis, w/o neurogenic claudication (L4-5) 06/03/2021   Lumbar lateral recess stenosis (Bilateral: L2-3, L4-5) (Right: L3-4) 06/03/2021   Lumbosacral foraminal stenosis (Bilateral: L2-3, L3-4) (Left: L5-S1) 06/03/2021   Lumbar nerve root impingement (Right: L3 at L2-3 & L3-4) 06/03/2021   Ligamentum flavum hypertrophy (L3-4, L4-5) 06/03/2021   Abnormal MRI, lumbar spine (04/23/2021) 04/24/2021   Long term prescription benzodiazepine use (alprazolam) (Xanax) 03/24/2021   Grade 1 Retrolisthesis of L2/L3 (5 mm) and L3/L4 (3 mm) 03/24/2021   Levoscoliosis of lumbar spine (L3-4 apex) 03/24/2021   DDD (degenerative disc disease), lumbosacral 03/24/2021   Lumbosacral facet arthropathy (Left: L3-4, L4-5, and L5-S1) 03/24/2021   Tricompartment osteoarthritis of knee (Left) 03/24/2021   Baker cyst (Left) 03/24/2021   Chronic low back pain (1ry area of Pain) (Bilateral) (R>L) w/o sciatica 03/24/2021   Lumbar facet syndrome (Bilateral) 03/24/2021   Chronic lower extremity pain (2ry area of Pain) (Bilateral) (L>R) 03/24/2021   Lumbosacral radiculitis/sensory radiculopathy at L2 (Bilateral) 03/24/2021   Lumbosacral radiculitis/sensory radiculopathy at L3 (Bilateral) 03/24/2021   Chronic pain syndrome 03/23/2021   Pharmacologic therapy 03/23/2021   Disorder of skeletal system 03/23/2021   Problems influencing health status 03/23/2021   PAD (peripheral artery disease) ([place]) 10/01/2020   GAD (generalized anxiety  disorder) 01/13/2018   MDD (major depressive disorder) 24/26/8341   Umbilical hernia 96/22/2979   Chronic knee pain (Left) 08/11/2017   Derangement of medial meniscus, posterior horn (Left) 08/11/2017   Vitamin D insufficiency 03/05/2016   BPH without obstruction/lower urinary tract symptoms 07/08/2015   Chronic fatigue 10/23/2014   Major depression, recurrent, full remission ([place]) 07/26/2013   Unsteady gait  07/26/2013   Orthostatic hypotension 07/26/2013   Depression 07/26/2013   Anxiety 06/23/2013   Chronic anxiety 06/23/2013   Tremor 05/18/2013   Bilateral carotid artery stenosis 08/30/2012    ONSET DATE: 08/11/2021  REFERRING DIAG: Parkinsons Disease  THERAPY DIAG:  Muscle weakness (generalized)  Other low back pain  Rationale for Evaluation and Treatment Rehabilitation  SUBJECTIVE: Pt reports similar low back pain since last session. Pt does not report any other changes since previous session.  Pt accompanied by: spouse  PERTINENT HISTORY: Dontai Arroyo is a 82 yo M who is referred to OPPT due to 2 falls in the setting of chronic parkinson's disease. Pt AMB with RW at baseline but is very physically active.   PAIN: Are you having pain? Yes: NPRS scale: 2/10 Pain location: low back Pain description: ache/soreness Aggravating factors: prolonged standing, transfers, walking Relieving factors: rest and medications   PATIENT GOALS  to walk better and be as active as possible. Also to get in/out of cars better.   Therex:   Physical therapy treatment session today consisted of completing assessment of goals and administration of testing as demonstrated and documented in flow sheet, treatment, and goals section of this note. Addition treatments may be found below.   Supine glute set with adductor squeeze 2 x 15 reps to activate core musculature   Supine Clamshell with GTB 2 x 10 x 5 sec holds   Pt requires multiple positional changes for postural alignment and he reports pain improvement with these changes.   Further education and practice with logroll technique educating patient to use contralateral upper extremity for pushoff as well as his momentum of lower extremities in order to keep proper alignment and lumbar spine to prevent compensatory movements and onset of pain. Required a couple attempts but improved form with practice.   PATIENT EDUCATION:    Education  details: Pt educated throughout session about proper posture and technique with exercises. Improved exercise technique, movement at target joints, use of target muscles after min to mod verbal, visual, tactile cues.  Person educated: Patient Education method: Explanation, Demonstration, Tactile cues, and Verbal cues Education comprehension: verbalized understanding, returned demonstration, verbal cues required, tactile cues required, and needs further education   HOME EXERCISE PROGRAM:No updates to HEP today. Pt to continue HEP as previously given  02/24/22: introduced to seated PWR! Exercises as part of HEP, step and twist modified to account for low back pain   Access Code: [email or web address] URL: [email or web address] Date: 02/06/2022 Prepared by: Sande Brothers  Exercises - Supine Transversus Abdominis Bracing - Hands on Stomach  - 1 x daily - 7 x weekly - 3 sets - 10 reps - Supine Transversus Abdominis Bracing with Double Leg Fallout  - 1 x daily - 7 x weekly - 3 sets - 10 reps - Supine Hip Adduction Isometric with Ball  - 1 x daily - 7 x weekly - 3 sets - 10 reps   Access Code: [email or web address] URL: [email or web address] Date: 12/09/2021 Prepared by: Amalia Hailey  Exercises - Seated Alternating Side Stretch with Arm Overhead  - 1 x daily - 7 x weekly -  2 sets - 10 reps - Seated Thoracic Extension Arms Overhead  - 1 x daily - 7 x weekly - 2 sets - 10 reps  GOALS: Goals reviewed with patient? Yes  SHORT TERM GOALS: Target date: MET   Pt will be independent with initial HEP in order to improve strength and balance in order to decrease fall risk and improve function at home and work.  Baseline: 08/21/2021-No formal HEP in place Goal status: MET   LONG TERM GOALS: Target date: 04/17/2022    Pt will improve BERG by at least 3 points in order to demonstrate clinically significant improvement in balance.   Baseline: 08/21/2021= 48/56 8/24:47/56; 11/13/2021=  47/56, 01/06/2022 = Unable to test due to time constraint- will test next session and update goal; 01/08/2022= 51/56- will keep goal active to ensure consistency.  12/21: 52/56 Goal status: Met  2.  Pt will be independent with final HEP in order to improve strength and balance in order to decrease fall risk and improve function at home and work.  Baseline: No HEP in place, 8/24: completing current HEP but still progressing; 11/13/2021- Patient continues to verbalize and demonstrate understanding of HEP for stretching/strengthening and mobility but still progressing and altering program as appropriate; 11/7: Continues follow current HEP but still progressing HEP as patient progresses 12/21: Been walking and completing core activation exercising, not doing as much balance lately.  Goal status: INITIAL  3.  Pt will decrease 5TSTS by at least 4 seconds in order to demonstrate clinically significant improvement in LE strength. Baseline: 08/21/2021= 19.01 sec without UE support 8/24:10.56 sec Goal status: MET  4.  Pt will decrease TUG to below 14 seconds/decrease in order to demonstrate decreased fall risk. Baseline:  08/21/2021=15.67 with RW 8/24: 12.41 Goal status: MET  5.  Pt will improve FOTO to target score of 56 to display perceived improvements in ability to complete ADL's.  Baseline: 08/21/2021=49 8/24: 45.6; 11/13/2021=46, 01/06/2022 = 45.6 12/21: 46 1/30: 50 Goal status: IN PROGRESS  6.  Pt will increase 10MWT by at least 0.15 m/s in order to demonstrate clinically significant improvement in community ambulation.   Baseline: 08/22/2021=0.94 m/s 8/24:1.22ms Goal status: MET  7 Pt will improve miniBESTest by at least 4 points to demonstrate clinically significant improvement in reduced falls with parkinsonian related deficits.  Baseline: 19/28; 11/13/2021- Unable to test due to time constraint- will test next session and update goal. 9/19: 18/28; 01/06/2022 - 23/28 Goal Status: MET   8. Pt will  increase 6MWT by at least [id number] order to demonstrate clinically significant improvement in cardiopulmonary endurance and community ambulation  Baseline: 11/13/2021= 650 feet with walker; 01/06/2022 = 1160 feet with walker.  Goal Status: MET   9.  Patient will improve modified Oswestry disability index by 10% or greater indicating improved functional capacity secondary to low back pain Baseline: 12/28:58% 1/30:54% Goal status: IN PROGRESS  10.  Patient will demonstrate improved core stabilization and hip strength as evidenced by ability to maintain bilateral hip flexion with control lowering to plinth with exacerbation of pain Baseline: Unable to complete upon reeval, legs immediately dropped due to weakness.1/30: able to hold B LE in air but has some discomfort in back, unable to control lowering Goal status: IN PROGRESS    ASSESSMENT:  CLINICAL IMPRESSION:   Pt presents to PT for progress note this session. Pt shows improvement in core activation as well as a general improvement in pain, at least when he is in clinic.  Patient also demonstrates improvement in Oswestry disability index indicating slight improvement in low back pain although this was not a clinically significant change in survey score.  Patient does report he feels his core activation is improving and he is having improved awareness of his posture when he is sitting and standing as well as need for support in certain sitting postures.  Plan will be to continue to work on lower extremity and core muscle activation and strength with plan to discharge her in the middle of February.  This plan was discussed with patient and patient and caregiver both comfortable with this plan.Patient's condition has the potential to improve in response to therapy. Maximum improvement is yet to be obtained. The anticipated improvement is attainable and reasonable in a generally predictable time.  Pt will continue to benefit from skilled physical  therapy intervention to address impairments, improve QOL, and attain therapy goals.     OBJECTIVE IMPAIRMENTS Abnormal gait, decreased activity tolerance, decreased balance, decreased coordination, decreased endurance, decreased knowledge of use of DME, decreased mobility, difficulty walking, decreased ROM, decreased strength, hypomobility, and impaired flexibility.   ACTIVITY LIMITATIONS carrying, lifting, bending, standing, squatting, sleeping, stairs, transfers, continence, bathing, toileting, and dressing  PARTICIPATION LIMITATIONS: cleaning, laundry, medication management, driving, shopping, community activity, and yard work  PERSONAL FACTORS Age are also affecting patient's functional outcome.   REHAB POTENTIAL: Good  CLINICAL DECISION MAKING: Stable/uncomplicated  EVALUATION COMPLEXITY: Low  PLAN: PT FREQUENCY: 2x/week  PT DURATION: 12 weeks  PLANNED INTERVENTIONS: Therapeutic exercises, Therapeutic activity, Neuromuscular re-education, Balance training, Gait training, Patient/Family education, Joint mobilization, Stair training, Vestibular training, Canalith repositioning, DME instructions, Dry Needling, Electrical stimulation, Spinal manipulation, and Spinal mobilization  PLAN FOR NEXT SESSION: continued targeted therapy for low back pain including lumbar and core stabilization exercises and hip strengthening.  [facility]  04/01/22, 8:36 AM

## 2022-04-01 ENCOUNTER — Encounter: Payer: Self-pay | Admitting: Physical Therapy

## 2022-04-01 DIAGNOSIS — G20B1 Parkinson's disease with dyskinesia, without mention of fluctuations: Secondary | ICD-10-CM | POA: Diagnosis not present

## 2022-04-02 ENCOUNTER — Encounter: Payer: Self-pay | Admitting: Physical Therapy

## 2022-04-02 ENCOUNTER — Ambulatory Visit: Payer: Medicare HMO | Attending: Neurology | Admitting: Physical Therapy

## 2022-04-02 DIAGNOSIS — R269 Unspecified abnormalities of gait and mobility: Secondary | ICD-10-CM

## 2022-04-02 DIAGNOSIS — R262 Difficulty in walking, not elsewhere classified: Secondary | ICD-10-CM | POA: Diagnosis not present

## 2022-04-02 DIAGNOSIS — M6281 Muscle weakness (generalized): Secondary | ICD-10-CM | POA: Diagnosis not present

## 2022-04-02 DIAGNOSIS — M5459 Other low back pain: Secondary | ICD-10-CM | POA: Diagnosis not present

## 2022-04-02 DIAGNOSIS — R2689 Other abnormalities of gait and mobility: Secondary | ICD-10-CM

## 2022-04-02 DIAGNOSIS — R2681 Unsteadiness on feet: Secondary | ICD-10-CM

## 2022-04-02 NOTE — Therapy (Signed)
OUTPATIENT PHYSICAL THERAPY NEURO TREATMENT  Patient Name: Joseph Arroyo MRN: [id number] DOB:03/09/1940, 82 y.o., male Today's Date: 04/02/2022  PCP: Joseph Hauser, DO REFERRING PROVIDER: Lavena Bullion, NP   PT End of Session - 04/02/22 1416     Visit Number 59    Number of Visits 66    Date for PT Re-Evaluation 04/16/22    Authorization Type [employer]    Authorization Time Period -04/16/22    Progress Note Due on Visit 43    PT Start Time 0230    PT Stop Time 0310    PT Time Calculation (min) 40 min    Equipment Utilized During Treatment Gait belt    Activity Tolerance Patient tolerated treatment well;No increased pain    Behavior During Therapy [facility] for tasks assessed/performed               Past Medical History:  Diagnosis Date   Allergic rhinitis due to pollen 11/21/2007   Brachial neuritis or radiculitis NOS    Cervicalgia    Concussion    age 8 - s/p accident   Costal chondritis    Depression    Essential and other specified forms of tremor    GERD (gastroesophageal reflux disease)    in past   Lumbago    Occlusion and stenosis of carotid artery without mention of cerebral infarction    Seizures [facility])    age 66 - after concussion   Past Surgical History:  Procedure Laterality Date   CATARACT EXTRACTION Right 07/18/14   CATARACT EXTRACTION W/PHACO Left 08/01/2014   Procedure: CATARACT EXTRACTION PHACO AND INTRAOCULAR LENS PLACEMENT ([place]);  Surgeon: Joseph Koyanagi, MD;  Location: [place];  Service: Ophthalmology;  Laterality: Left;  IVA TOPICAL   COLONOSCOPY  2008   OTHER SURGICAL HISTORY  2002   ear surgery   STAPEDES SURGERY Right 2002   Patient Active Problem List   Diagnosis Date Noted   Parkinson's disease with dyskinesia 01/27/2022   Dysphagia, pharyngeal phase 09/17/2021   Cervicalgia 08/05/2021   Spondylosis without myelopathy or radiculopathy, lumbosacral region 06/24/2021   Lumbar central spinal  stenosis, w/o neurogenic claudication (L4-5) 06/03/2021   Lumbar lateral recess stenosis (Bilateral: L2-3, L4-5) (Right: L3-4) 06/03/2021   Lumbosacral foraminal stenosis (Bilateral: L2-3, L3-4) (Left: L5-S1) 06/03/2021   Lumbar nerve root impingement (Right: L3 at L2-3 & L3-4) 06/03/2021   Ligamentum flavum hypertrophy (L3-4, L4-5) 06/03/2021   Abnormal MRI, lumbar spine (04/23/2021) 04/24/2021   Long term prescription benzodiazepine use (alprazolam) (Xanax) 03/24/2021   Grade 1 Retrolisthesis of L2/L3 (5 mm) and L3/L4 (3 mm) 03/24/2021   Levoscoliosis of lumbar spine (L3-4 apex) 03/24/2021   DDD (degenerative disc disease), lumbosacral 03/24/2021   Lumbosacral facet arthropathy (Left: L3-4, L4-5, and L5-S1) 03/24/2021   Tricompartment osteoarthritis of knee (Left) 03/24/2021   Baker cyst (Left) 03/24/2021   Chronic low back pain (1ry area of Pain) (Bilateral) (R>L) w/o sciatica 03/24/2021   Lumbar facet syndrome (Bilateral) 03/24/2021   Chronic lower extremity pain (2ry area of Pain) (Bilateral) (L>R) 03/24/2021   Lumbosacral radiculitis/sensory radiculopathy at L2 (Bilateral) 03/24/2021   Lumbosacral radiculitis/sensory radiculopathy at L3 (Bilateral) 03/24/2021   Chronic pain syndrome 03/23/2021   Pharmacologic therapy 03/23/2021   Disorder of skeletal system 03/23/2021   Problems influencing health status 03/23/2021   PAD (peripheral artery disease) ([place]) 10/01/2020   GAD (generalized anxiety disorder) 01/13/2018   MDD (major depressive disorder) 62/94/7654   Umbilical hernia 65/05/5463   Chronic knee  pain (Left) 08/11/2017   Derangement of medial meniscus, posterior horn (Left) 08/11/2017   Vitamin D insufficiency 03/05/2016   BPH without obstruction/lower urinary tract symptoms 07/08/2015   Chronic fatigue 10/23/2014   Major depression, recurrent, full remission ([place]) 07/26/2013   Unsteady gait 07/26/2013   Orthostatic hypotension 07/26/2013   Depression 07/26/2013   Anxiety  06/23/2013   Chronic anxiety 06/23/2013   Tremor 05/18/2013   Bilateral carotid artery stenosis 08/30/2012    ONSET DATE: 08/11/2021  REFERRING DIAG: Parkinsons Disease  THERAPY DIAG:  Muscle weakness (generalized)  Other low back pain  Difficulty in walking, not elsewhere classified  Other abnormalities of gait and mobility  Abnormality of gait and mobility  Unsteadiness on feet  Rationale for Evaluation and Treatment Rehabilitation  SUBJECTIVE: Pt reports similar low back pain since last session. Pt saw neurologist yesterday and had a medication change. Too soon to report if change has been effective for his trembling. Pt back pain continues to be at manageable level when he presents to therapy.   Pt accompanied by: spouse  PERTINENT HISTORY: Joseph Arroyo is a 82 yo M who is referred to OPPT due to 2 falls in the setting of chronic parkinson's disease. Pt AMB with RW at baseline but is very physically active.   PAIN: Are you having pain? Yes: NPRS scale: 2/10 Pain location: low back Pain description: ache/soreness Aggravating factors: prolonged standing, transfers, walking Relieving factors: rest and medications   PATIENT GOALS  to walk better and be as active as possible. Also to get in/out of cars better.   Therex:    Pt does not demonstrate good log rolling technique when transitioning to supine, further education to be provided following supine activities to improve carryover of technique for pain relief   Moist heat in supine for pain relief and for increased blood flow to target muscles via vasodilation   In supine bolster placed under knees   10x 5 sec hold transversus abdominis (TrA) bracing for core activation retraining  Supine glute set with adductor squeeze 2 x 15 reps to activate core musculature   TrA activation with march 2 x 5 reps ea LE for core stability and retraining    Supine Clamshell with GTB 2 x 15 x 5 sec holds   Supine modified  dead bug ( x 5 ea LE) with ball on torso for stability ( p ball)  -no pain noted   Supine SLR 2 x 10 with cues for maintaining neutral spine posture   Log roll demonstration and practice x 3 repetitions. First attempt pt does not use contralateral UE. Good form with following 2 repetitions  Pt requires multiple positional changes for postural alignment and he reports pain improvement with these changes.  Seated with mirror anterior for feedback marching with focus on postural alignment maintenance throughout.  Marching  x 10    2 x log rolling practice, improved carryover form completion earlier. Improved low back pain noted with this strategy as well.    PATIENT EDUCATION:    Education details: Pt educated throughout session about proper posture and technique with exercises. Improved exercise technique, movement at target joints, use of target muscles after min to mod verbal, visual, tactile cues.  Person educated: Patient Education method: Explanation, Demonstration, Tactile cues, and Verbal cues Education comprehension: verbalized understanding, returned demonstration, verbal cues required, tactile cues required, and needs further education   HOME EXERCISE PROGRAM:No updates to HEP today. Pt to continue HEP as previously given  02/24/22: introduced to seated PWR! Exercises as part of HEP, step and twist modified to account for low back pain   Access Code: [email or web address] URL: [email or web address] Date: 02/06/2022 Prepared by: Sande Brothers  Exercises - Supine Transversus Abdominis Bracing - Hands on Stomach  - 1 x daily - 7 x weekly - 3 sets - 10 reps - Supine Transversus Abdominis Bracing with Double Leg Fallout  - 1 x daily - 7 x weekly - 3 sets - 10 reps - Supine Hip Adduction Isometric with Ball  - 1 x daily - 7 x weekly - 3 sets - 10 reps   Access Code: [email or web address] URL: [email or web address] Date: 12/09/2021 Prepared by: Amalia Hailey  Exercises - Seated Alternating Side Stretch with Arm Overhead  - 1 x daily - 7 x weekly - 2 sets - 10 reps - Seated Thoracic Extension Arms Overhead  - 1 x daily - 7 x weekly - 2 sets - 10 reps  GOALS: Goals reviewed with patient? Yes  SHORT TERM GOALS: Target date: MET   Pt will be independent with initial HEP in order to improve strength and balance in order to decrease fall risk and improve function at home and work.  Baseline: 08/21/2021-No formal HEP in place Goal status: MET   LONG TERM GOALS: Target date: 04/17/2022    Pt will improve BERG by at least 3 points in order to demonstrate clinically significant improvement in balance.   Baseline: 08/21/2021= 48/56 8/24:47/56; 11/13/2021= 47/56, 01/06/2022 = Unable to test due to time constraint- will test next session and update goal; 01/08/2022= 51/56- will keep goal active to ensure consistency.  12/21: 52/56 Goal status: Met  2.  Pt will be independent with final HEP in order to improve strength and balance in order to decrease fall risk and improve function at home and work.  Baseline: No HEP in place, 8/24: completing current HEP but still progressing; 11/13/2021- Patient continues to verbalize and demonstrate understanding of HEP for stretching/strengthening and mobility but still progressing and altering program as appropriate; 11/7: Continues follow current HEP but still progressing HEP as patient progresses 12/21: Been walking and completing core activation exercising, not doing as much balance lately.  Goal status: IN PROGRESS  3.  Pt will decrease 5TSTS by at least 4 seconds in order to demonstrate clinically significant improvement in LE strength. Baseline: 08/21/2021= 19.01 sec without UE support 8/24:10.56 sec Goal status: MET  4.  Pt will decrease TUG to below 14 seconds/decrease in order to demonstrate decreased fall risk. Baseline:  08/21/2021=15.67 with RW 8/24: 12.41 Goal status: MET  5.  Pt will improve FOTO  to target score of 56 to display perceived improvements in ability to complete ADL's.  Baseline: 08/21/2021=49 8/24: 45.6; 11/13/2021=46, 01/06/2022 = 45.6 12/21: 46 1/30: 50 Goal status: IN PROGRESS  6.  Pt will increase 10MWT by at least 0.15 m/s in order to demonstrate clinically significant improvement in community ambulation.   Baseline: 08/22/2021=0.94 m/s 8/24:1.1ms Goal status: MET  7 Pt will improve miniBESTest by at least 4 points to demonstrate clinically significant improvement in reduced falls with parkinsonian related deficits.  Baseline: 19/28; 11/13/2021- Unable to test due to time constraint- will test next session and update goal. 9/19: 18/28; 01/06/2022 - 23/28 Goal Status: MET   8. Pt will increase 6MWT by at least [id number] order to demonstrate clinically significant improvement in cardiopulmonary endurance and community ambulation  Baseline: 11/13/2021= 650 feet with walker; 01/06/2022 = 1160 feet with  walker.  Goal Status: MET   9.  Patient will improve modified Oswestry disability index by 10% or greater indicating improved functional capacity secondary to low back pain Baseline: 12/28:58% 1/30:54% Goal status: IN PROGRESS  10.  Patient will demonstrate improved core stabilization and hip strength as evidenced by ability to maintain bilateral hip flexion with control lowering to plinth with exacerbation of pain Baseline: Unable to complete upon reeval, legs immediately dropped due to weakness.1/30: able to hold B LE in air but has some discomfort in back, unable to control lowering Goal status: IN PROGRESS    ASSESSMENT:  CLINICAL IMPRESSION:  Patient presents with good motivation for completion of physical therapy activities.  Patient continues with transversus abdominis bracing for improved core stability with various activities.  Patient also continues to be educated regarding logroll technique as he reports this improve his low back pain with transitions but  does have difficulty with carryover between sessions as well as at home.  Will continue to be at target of therapy to ensure patient this is correctly prior to discharge.  Patient also continues with hip muscle strengthening and activation with good overall results. Pt will continue to benefit from skilled physical therapy intervention to address impairments, improve QOL, and attain therapy goals.       OBJECTIVE IMPAIRMENTS Abnormal gait, decreased activity tolerance, decreased balance, decreased coordination, decreased endurance, decreased knowledge of use of DME, decreased mobility, difficulty walking, decreased ROM, decreased strength, hypomobility, and impaired flexibility.   ACTIVITY LIMITATIONS carrying, lifting, bending, standing, squatting, sleeping, stairs, transfers, continence, bathing, toileting, and dressing  PARTICIPATION LIMITATIONS: cleaning, laundry, medication management, driving, shopping, community activity, and yard work  PERSONAL FACTORS Age are also affecting patient's functional outcome.   REHAB POTENTIAL: Good  CLINICAL DECISION MAKING: Stable/uncomplicated  EVALUATION COMPLEXITY: Low  PLAN: PT FREQUENCY: 2x/week  PT DURATION: 12 weeks  PLANNED INTERVENTIONS: Therapeutic exercises, Therapeutic activity, Neuromuscular re-education, Balance training, Gait training, Patient/Family education, Joint mobilization, Stair training, Vestibular training, Canalith repositioning, DME instructions, Dry Needling, Electrical stimulation, Spinal manipulation, and Spinal mobilization  PLAN FOR NEXT SESSION: continued targeted therapy for low back pain including lumbar and core stabilization exercises and hip strengthening.  Particia Lather PT ,DPT Physical Therapist- [facility]     04/02/22, 2:17 PM

## 2022-04-07 ENCOUNTER — Encounter: Payer: Self-pay | Admitting: Physical Therapy

## 2022-04-07 ENCOUNTER — Ambulatory Visit: Payer: Medicare HMO | Admitting: Physical Therapy

## 2022-04-07 DIAGNOSIS — R262 Difficulty in walking, not elsewhere classified: Secondary | ICD-10-CM | POA: Diagnosis not present

## 2022-04-07 DIAGNOSIS — M5459 Other low back pain: Secondary | ICD-10-CM | POA: Diagnosis not present

## 2022-04-07 DIAGNOSIS — R2689 Other abnormalities of gait and mobility: Secondary | ICD-10-CM | POA: Diagnosis not present

## 2022-04-07 DIAGNOSIS — R269 Unspecified abnormalities of gait and mobility: Secondary | ICD-10-CM

## 2022-04-07 DIAGNOSIS — M6281 Muscle weakness (generalized): Secondary | ICD-10-CM | POA: Diagnosis not present

## 2022-04-07 DIAGNOSIS — R2681 Unsteadiness on feet: Secondary | ICD-10-CM | POA: Diagnosis not present

## 2022-04-07 NOTE — Therapy (Signed)
OUTPATIENT PHYSICAL THERAPY NEURO TREATMENT  Patient Name: Joseph Arroyo MRN: [id number] DOB:11/11/1940, 82 y.o., male Today's Date: 04/07/2022  PCP: Olin Hauser, DO REFERRING PROVIDER: Lavena Bullion, NP   PT End of Session - 04/07/22 1526     Visit Number 30    Number of Visits 9    Date for PT Re-Evaluation 04/16/22    Authorization Type [employer]    Authorization Time Period -04/16/22    Progress Note Due on Visit 70    PT Start Time 0232    PT Stop Time 0313    PT Time Calculation (min) 41 min    Equipment Utilized During Treatment Gait belt    Activity Tolerance Patient tolerated treatment well;No increased pain    Behavior During Therapy [facility] for tasks assessed/performed                Past Medical History:  Diagnosis Date   Allergic rhinitis due to pollen 11/21/2007   Brachial neuritis or radiculitis NOS    Cervicalgia    Concussion    age 57 - s/p accident   Costal chondritis    Depression    Essential and other specified forms of tremor    GERD (gastroesophageal reflux disease)    in past   Lumbago    Occlusion and stenosis of carotid artery without mention of cerebral infarction    Seizures [facility])    age 53 - after concussion   Past Surgical History:  Procedure Laterality Date   CATARACT EXTRACTION Right 07/18/14   CATARACT EXTRACTION W/PHACO Left 08/01/2014   Procedure: CATARACT EXTRACTION PHACO AND INTRAOCULAR LENS PLACEMENT ([place]);  Surgeon: Leandrew Koyanagi, MD;  Location: [place];  Service: Ophthalmology;  Laterality: Left;  IVA TOPICAL   COLONOSCOPY  2008   OTHER SURGICAL HISTORY  2002   ear surgery   STAPEDES SURGERY Right 2002   Patient Active Problem List   Diagnosis Date Noted   Parkinson's disease with dyskinesia 01/27/2022   Dysphagia, pharyngeal phase 09/17/2021   Cervicalgia 08/05/2021   Spondylosis without myelopathy or radiculopathy, lumbosacral region 06/24/2021   Lumbar central spinal  stenosis, w/o neurogenic claudication (L4-5) 06/03/2021   Lumbar lateral recess stenosis (Bilateral: L2-3, L4-5) (Right: L3-4) 06/03/2021   Lumbosacral foraminal stenosis (Bilateral: L2-3, L3-4) (Left: L5-S1) 06/03/2021   Lumbar nerve root impingement (Right: L3 at L2-3 & L3-4) 06/03/2021   Ligamentum flavum hypertrophy (L3-4, L4-5) 06/03/2021   Abnormal MRI, lumbar spine (04/23/2021) 04/24/2021   Long term prescription benzodiazepine use (alprazolam) (Xanax) 03/24/2021   Grade 1 Retrolisthesis of L2/L3 (5 mm) and L3/L4 (3 mm) 03/24/2021   Levoscoliosis of lumbar spine (L3-4 apex) 03/24/2021   DDD (degenerative disc disease), lumbosacral 03/24/2021   Lumbosacral facet arthropathy (Left: L3-4, L4-5, and L5-S1) 03/24/2021   Tricompartment osteoarthritis of knee (Left) 03/24/2021   Baker cyst (Left) 03/24/2021   Chronic low back pain (1ry area of Pain) (Bilateral) (R>L) w/o sciatica 03/24/2021   Lumbar facet syndrome (Bilateral) 03/24/2021   Chronic lower extremity pain (2ry area of Pain) (Bilateral) (L>R) 03/24/2021   Lumbosacral radiculitis/sensory radiculopathy at L2 (Bilateral) 03/24/2021   Lumbosacral radiculitis/sensory radiculopathy at L3 (Bilateral) 03/24/2021   Chronic pain syndrome 03/23/2021   Pharmacologic therapy 03/23/2021   Disorder of skeletal system 03/23/2021   Problems influencing health status 03/23/2021   PAD (peripheral artery disease) ([place]) 10/01/2020   GAD (generalized anxiety disorder) 01/13/2018   MDD (major depressive disorder) 62/69/4854   Umbilical hernia 62/70/3500   Chronic  knee pain (Left) 08/11/2017   Derangement of medial meniscus, posterior horn (Left) 08/11/2017   Vitamin D insufficiency 03/05/2016   BPH without obstruction/lower urinary tract symptoms 07/08/2015   Chronic fatigue 10/23/2014   Major depression, recurrent, full remission ([place]) 07/26/2013   Unsteady gait 07/26/2013   Orthostatic hypotension 07/26/2013   Depression 07/26/2013   Anxiety  06/23/2013   Chronic anxiety 06/23/2013   Tremor 05/18/2013   Bilateral carotid artery stenosis 08/30/2012    ONSET DATE: 08/11/2021  REFERRING DIAG: Parkinsons Disease  THERAPY DIAG:  Muscle weakness (generalized)  Other low back pain  Difficulty in walking, not elsewhere classified  Other abnormalities of gait and mobility  Abnormality of gait and mobility  Rationale for Evaluation and Treatment Rehabilitation  SUBJECTIVE: Pt reports similar low back pain since last session. Pt reports some increased pain earlier today that was relieved with tylenol.   Pt accompanied by: spouse  PERTINENT HISTORY: Joseph Arroyo is a 82 yo M who is referred to OPPT due to 2 falls in the setting of chronic parkinson's disease. Pt AMB with RW at baseline but is very physically active.   PAIN: Are you having pain? Yes: NPRS scale: 2/10 Pain location: low back Pain description: ache/soreness Aggravating factors: prolonged standing, transfers, walking Relieving factors: rest and medications   PATIENT GOALS  to walk better and be as active as possible. Also to get in/out of cars better.   Therex:    Pt does not demonstrate good log rolling technique when transitioning to supine, further education to be provided following supine activities to improve carryover of technique for pain relief   Moist heat in supine for pain relief and for increased blood flow to target muscles via vasodilation   In supine bolster placed under knees   10x 5 sec hold transversus abdominis (TrA) bracing for core activation retraining -x 10 marching   Supine glute set with adductor squeeze 2 x 15 reps to activate core musculature   Supine Clamshell with GTB 2 x 15 x 5 sec holds   Significant time spent on log roll practice, good ability to complete from supine to sit but increased practice necessary for sit to side lying/ supine  x 10 minutes   Transferred to standard armchair.  Placed half foam roller  parallel to patient spine and performed thoracic extensions trying to keep low back in contact with base of foam roller while also conducting as much of thoracic spine with foam roller as possible.  Patient reports he likes this intervention.  X 10 repetitions for 5 seconds each  PATIENT EDUCATION:    Education details: Pt educated throughout session about proper posture and technique with exercises. Improved exercise technique, movement at target joints, use of target muscles after min to mod verbal, visual, tactile cues.  Person educated: Patient Education method: Explanation, Demonstration, Tactile cues, and Verbal cues Education comprehension: verbalized understanding, returned demonstration, verbal cues required, tactile cues required, and needs further education   HOME EXERCISE PROGRAM:No updates to HEP today. Pt to continue HEP as previously given  02/24/22: introduced to seated PWR! Exercises as part of HEP, step and twist modified to account for low back pain   Access Code: [email or web address] URL: [email or web address] Date: 02/06/2022 Prepared by: Sande Brothers  Exercises - Supine Transversus Abdominis Bracing - Hands on Stomach  - 1 x daily - 7 x weekly - 3 sets - 10 reps - Supine Transversus Abdominis Bracing with Double Leg Fallout  - 1 x daily -  7 x weekly - 3 sets - 10 reps - Supine Hip Adduction Isometric with Ball  - 1 x daily - 7 x weekly - 3 sets - 10 reps   Access Code: [email or web address] URL: [email or web address] Date: 12/09/2021 Prepared by: Amalia Hailey  Exercises - Seated Alternating Side Stretch with Arm Overhead  - 1 x daily - 7 x weekly - 2 sets - 10 reps - Seated Thoracic Extension Arms Overhead  - 1 x daily - 7 x weekly - 2 sets - 10 reps  GOALS: Goals reviewed with patient? Yes  SHORT TERM GOALS: Target date: MET   Pt will be independent with initial HEP in order to improve strength and balance in order to decrease fall risk and  improve function at home and work.  Baseline: 08/21/2021-No formal HEP in place Goal status: MET   LONG TERM GOALS: Target date: 04/17/2022    Pt will improve BERG by at least 3 points in order to demonstrate clinically significant improvement in balance.   Baseline: 08/21/2021= 48/56 8/24:47/56; 11/13/2021= 47/56, 01/06/2022 = Unable to test due to time constraint- will test next session and update goal; 01/08/2022= 51/56- will keep goal active to ensure consistency.  12/21: 52/56 Goal status: Met  2.  Pt will be independent with final HEP in order to improve strength and balance in order to decrease fall risk and improve function at home and work.  Baseline: No HEP in place, 8/24: completing current HEP but still progressing; 11/13/2021- Patient continues to verbalize and demonstrate understanding of HEP for stretching/strengthening and mobility but still progressing and altering program as appropriate; 11/7: Continues follow current HEP but still progressing HEP as patient progresses 12/21: Been walking and completing core activation exercising, not doing as much balance lately.  Goal status: IN PROGRESS  3.  Pt will decrease 5TSTS by at least 4 seconds in order to demonstrate clinically significant improvement in LE strength. Baseline: 08/21/2021= 19.01 sec without UE support 8/24:10.56 sec Goal status: MET  4.  Pt will decrease TUG to below 14 seconds/decrease in order to demonstrate decreased fall risk. Baseline:  08/21/2021=15.67 with RW 8/24: 12.41 Goal status: MET  5.  Pt will improve FOTO to target score of 56 to display perceived improvements in ability to complete ADL's.  Baseline: 08/21/2021=49 8/24: 45.6; 11/13/2021=46, 01/06/2022 = 45.6 12/21: 46 1/30: 50 Goal status: IN PROGRESS  6.  Pt will increase 10MWT by at least 0.15 m/s in order to demonstrate clinically significant improvement in community ambulation.   Baseline: 08/22/2021=0.94 m/s 8/24:1.16ms Goal status: MET  7 Pt will  improve miniBESTest by at least 4 points to demonstrate clinically significant improvement in reduced falls with parkinsonian related deficits.  Baseline: 19/28; 11/13/2021- Unable to test due to time constraint- will test next session and update goal. 9/19: 18/28; 01/06/2022 - 23/28 Goal Status: MET   8. Pt will increase 6MWT by at least [id number] order to demonstrate clinically significant improvement in cardiopulmonary endurance and community ambulation  Baseline: 11/13/2021= 650 feet with walker; 01/06/2022 = 1160 feet with walker.  Goal Status: MET   9.  Patient will improve modified Oswestry disability index by 10% or greater indicating improved functional capacity secondary to low back pain Baseline: 12/28:58% 1/30:54% Goal status: IN PROGRESS  10.  Patient will demonstrate improved core stabilization and hip strength as evidenced by ability to maintain bilateral hip flexion with control lowering to plinth with exacerbation of pain Baseline: Unable to complete upon reeval, legs immediately  dropped due to weakness.1/30: able to hold B LE in air but has some discomfort in back, unable to control lowering Goal status: IN PROGRESS    ASSESSMENT:  CLINICAL IMPRESSION:  Patient presents with good motivation for completion of physical therapy activities.  Patient continues with transversus abdominis bracing for improved core stability with various activities.  Patient also continues to be educated regarding logroll technique as he reports this improve his low back pain with transitions but does have difficulty with carryover between sessions as well as at home.  Will continue to be at target of therapy to ensure patient this is correctly prior to discharge.  Patient also continues with hip muscle strengthening and activation with good overall results. Pt will continue to benefit from skilled physical therapy intervention to address impairments, improve QOL, and attain therapy goals.        OBJECTIVE IMPAIRMENTS Abnormal gait, decreased activity tolerance, decreased balance, decreased coordination, decreased endurance, decreased knowledge of use of DME, decreased mobility, difficulty walking, decreased ROM, decreased strength, hypomobility, and impaired flexibility.   ACTIVITY LIMITATIONS carrying, lifting, bending, standing, squatting, sleeping, stairs, transfers, continence, bathing, toileting, and dressing  PARTICIPATION LIMITATIONS: cleaning, laundry, medication management, driving, shopping, community activity, and yard work  PERSONAL FACTORS Age are also affecting patient's functional outcome.   REHAB POTENTIAL: Good  CLINICAL DECISION MAKING: Stable/uncomplicated  EVALUATION COMPLEXITY: Low  PLAN: PT FREQUENCY: 2x/week  PT DURATION: 12 weeks  PLANNED INTERVENTIONS: Therapeutic exercises, Therapeutic activity, Neuromuscular re-education, Balance training, Gait training, Patient/Family education, Joint mobilization, Stair training, Vestibular training, Canalith repositioning, DME instructions, Dry Needling, Electrical stimulation, Spinal manipulation, and Spinal mobilization  PLAN FOR NEXT SESSION: continued targeted therapy for low back pain including lumbar and core stabilization exercises and hip strengthening.  [place] ,DPT Physical Therapist- [facility]     04/07/22, 3:27 PM

## 2022-04-09 ENCOUNTER — Ambulatory Visit: Payer: Medicare HMO | Admitting: Physical Therapy

## 2022-04-09 DIAGNOSIS — M5459 Other low back pain: Secondary | ICD-10-CM

## 2022-04-09 DIAGNOSIS — R262 Difficulty in walking, not elsewhere classified: Secondary | ICD-10-CM | POA: Diagnosis not present

## 2022-04-09 DIAGNOSIS — R2681 Unsteadiness on feet: Secondary | ICD-10-CM | POA: Diagnosis not present

## 2022-04-09 DIAGNOSIS — R2689 Other abnormalities of gait and mobility: Secondary | ICD-10-CM

## 2022-04-09 DIAGNOSIS — R269 Unspecified abnormalities of gait and mobility: Secondary | ICD-10-CM | POA: Diagnosis not present

## 2022-04-09 DIAGNOSIS — M6281 Muscle weakness (generalized): Secondary | ICD-10-CM | POA: Diagnosis not present

## 2022-04-09 NOTE — Therapy (Signed)
OUTPATIENT PHYSICAL THERAPY NEURO TREATMENT  Patient Name: Joseph Arroyo MRN: [id number] DOB:[birth year], 82 y.o., male Today's Date: 04/10/2022  PCP: Joseph Hauser, DO REFERRING PROVIDER: Lavena Bullion, NP   PT End of Session - 04/09/22 1525     Visit Number 32    Number of Visits 84    Date for PT Re-Evaluation 04/16/22    Authorization Type [employer]    Authorization Time Period -04/16/22    Progress Note Due on Visit 57    PT Start Time 1518    PT Stop Time 1558    PT Time Calculation (min) 40 min    Equipment Utilized During Treatment Gait belt    Activity Tolerance Patient tolerated treatment well;No increased pain    Behavior During Therapy [facility] for tasks assessed/performed                Past Medical History:  Diagnosis Date   Allergic rhinitis due to pollen 11/21/2007   Brachial neuritis or radiculitis NOS    Cervicalgia    Concussion    age 82 - s/p accident   Costal chondritis    Depression    Essential and other specified forms of tremor    GERD (gastroesophageal reflux disease)    in past   Lumbago    Occlusion and stenosis of carotid artery without mention of cerebral infarction    Seizures [facility])    age 82 - after concussion   Past Surgical History:  Procedure Laterality Date   CATARACT EXTRACTION Right 07/18/14   CATARACT EXTRACTION W/PHACO Left 08/01/2014   Procedure: CATARACT EXTRACTION PHACO AND INTRAOCULAR LENS PLACEMENT ([place]);  Surgeon: Joseph Koyanagi, MD;  Location: [facility];  Service: Ophthalmology;  Laterality: Left;  IVA TOPICAL   COLONOSCOPY  2008   OTHER SURGICAL HISTORY  2002   ear surgery   STAPEDES SURGERY Right 2002   Patient Active Problem List   Diagnosis Date Noted   Parkinson's disease with dyskinesia 01/27/2022   Dysphagia, pharyngeal phase 09/17/2021   Cervicalgia 08/05/2021   Spondylosis without myelopathy or radiculopathy, lumbosacral region 06/24/2021   Lumbar central spinal  stenosis, w/o neurogenic claudication (L4-5) 06/03/2021   Lumbar lateral recess stenosis (Bilateral: L2-3, L4-5) (Right: L3-4) 06/03/2021   Lumbosacral foraminal stenosis (Bilateral: L2-3, L3-4) (Left: L5-S1) 06/03/2021   Lumbar nerve root impingement (Right: L3 at L2-3 & L3-4) 06/03/2021   Ligamentum flavum hypertrophy (L3-4, L4-5) 06/03/2021   Abnormal MRI, lumbar spine (04/23/2021) 04/24/2021   Long term prescription benzodiazepine use (alprazolam) (Xanax) 03/24/2021   Grade 1 Retrolisthesis of L2/L3 (5 mm) and L3/L4 (3 mm) 03/24/2021   Levoscoliosis of lumbar spine (L3-4 apex) 03/24/2021   DDD (degenerative disc disease), lumbosacral 03/24/2021   Lumbosacral facet arthropathy (Left: L3-4, L4-5, and L5-S1) 03/24/2021   Tricompartment osteoarthritis of knee (Left) 03/24/2021   Baker cyst (Left) 03/24/2021   Chronic low back pain (1ry area of Pain) (Bilateral) (R>L) w/o sciatica 03/24/2021   Lumbar facet syndrome (Bilateral) 03/24/2021   Chronic lower extremity pain (2ry area of Pain) (Bilateral) (L>R) 03/24/2021   Lumbosacral radiculitis/sensory radiculopathy at L2 (Bilateral) 03/24/2021   Lumbosacral radiculitis/sensory radiculopathy at L3 (Bilateral) 03/24/2021   Chronic pain syndrome 03/23/2021   Pharmacologic therapy 03/23/2021   Disorder of skeletal system 03/23/2021   Problems influencing health status 03/23/2021   PAD (peripheral artery disease) ([place]) 10/01/2020   GAD (generalized anxiety disorder) 01/13/2018   MDD (major depressive disorder) 99991111   Umbilical hernia 0000000   Chronic  knee pain (Left) 08/11/2017   Derangement of medial meniscus, posterior horn (Left) 08/11/2017   Vitamin D insufficiency 03/05/2016   BPH without obstruction/lower urinary tract symptoms 07/08/2015   Chronic fatigue 10/23/2014   Major depression, recurrent, full remission ([place]) 07/26/2013   Unsteady gait 07/26/2013   Orthostatic hypotension 07/26/2013   Depression 07/26/2013   Anxiety  06/23/2013   Chronic anxiety 06/23/2013   Tremor 05/18/2013   Bilateral carotid artery stenosis 08/30/2012    ONSET DATE: 08/11/2021  REFERRING DIAG: Parkinsons Disease  THERAPY DIAG:  Other low back pain  Difficulty in walking, not elsewhere classified  Other abnormalities of gait and mobility  Abnormality of gait and mobility  Unsteadiness on feet  Rationale for Evaluation and Treatment Rehabilitation  SUBJECTIVE: Pt reports similar low back pain since last session.He has been showing improvements with log roll technique. Pt caregiver reports their DTR who is  a Physiological scientist is hoping to come next week prior to discharge.   Pt accompanied by: spouse  PERTINENT HISTORY: Joseph Arroyo is a 82 yo M who is referred to OPPT due to 2 falls in the setting of chronic parkinson's disease. Pt AMB with RW at baseline but is very physically active.   PAIN: Are you having pain? Yes: NPRS scale: 2/10 Pain location: low back Pain description: ache/soreness Aggravating factors: prolonged standing, transfers, walking Relieving factors: rest and medications   PATIENT GOALS  to walk better and be as active as possible. Also to get in/out of cars better.   Therex:    Pt demonstrates good log roll technique with transition to supine without cues this date.   Moist heat in supine for pain relief and for increased blood flow to target muscles via vasodilation   In supine bolster placed under knees   10x 5 sec hold transversus abdominis (TrA) bracing for core activation retraining -x 10 marching   Supine  adductor squeeze 2 x 15 reps to activate core musculature   Supine Clamshell with GTB 2 x 15 x 5 sec holds   Log roll to get up demonstrating good technique  Transferred to standard armchair.  Placed half foam roller parallel to patient spine and performed thoracic extensions trying to keep low back in contact with base of foam roller while also conducting as much of  thoracic spine with foam roller as possible.  Patient reports he likes this intervention.  X 10 repetitions for 5 seconds each  PATIENT EDUCATION:    Education details: Pt educated throughout session about proper posture and technique with exercises. Improved exercise technique, movement at target joints, use of target muscles after min to mod verbal, visual, tactile cues.  Person educated: Patient Education method: Explanation, Demonstration, Tactile cues, and Verbal cues Education comprehension: verbalized understanding, returned demonstration, verbal cues required, tactile cues required, and needs further education   HOME EXERCISE PROGRAM:No updates to HEP today. Pt to continue HEP as previously given  02/24/22: introduced to seated PWR! Exercises as part of HEP, step and twist modified to account for low back pain   Access Code: [email or web address] URL: [email or web address] Date: 02/06/2022 Prepared by: Sande Brothers  Exercises - Supine Transversus Abdominis Bracing - Hands on Stomach  - 1 x daily - 7 x weekly - 3 sets - 10 reps - Supine Transversus Abdominis Bracing with Double Leg Fallout  - 1 x daily - 7 x weekly - 3 sets - 10 reps - Supine Hip Adduction Isometric with Ball  - 1 x daily -  7 x weekly - 3 sets - 10 reps   Access Code: [email or web address] URL: [email or web address] Date: 12/09/2021 Prepared by: Amalia Hailey  Exercises - Seated Alternating Side Stretch with Arm Overhead  - 1 x daily - 7 x weekly - 2 sets - 10 reps - Seated Thoracic Extension Arms Overhead  - 1 x daily - 7 x weekly - 2 sets - 10 reps  GOALS: Goals reviewed with patient? Yes  SHORT TERM GOALS: Target date: MET   Pt will be independent with initial HEP in order to improve strength and balance in order to decrease fall risk and improve function at home and work.  Baseline: 08/21/2021-No formal HEP in place Goal status: MET   LONG TERM GOALS: Target date: 04/17/2022    Pt  will improve BERG by at least 3 points in order to demonstrate clinically significant improvement in balance.   Baseline: 08/21/2021= 48/56 8/24:47/56; 11/13/2021= 47/56, 01/06/2022 = Unable to test due to time constraint- will test next session and update goal; 01/08/2022= 51/56- will keep goal active to ensure consistency.  12/21: 52/56 Goal status: Met  2.  Pt will be independent with final HEP in order to improve strength and balance in order to decrease fall risk and improve function at home and work.  Baseline: No HEP in place, 8/24: completing current HEP but still progressing; 11/13/2021- Patient continues to verbalize and demonstrate understanding of HEP for stretching/strengthening and mobility but still progressing and altering program as appropriate; 11/7: Continues follow current HEP but still progressing HEP as patient progresses 12/21: Been walking and completing core activation exercising, not doing as much balance lately.  Goal status: IN PROGRESS  3.  Pt will decrease 5TSTS by at least 4 seconds in order to demonstrate clinically significant improvement in LE strength. Baseline: 08/21/2021= 19.01 sec without UE support 8/24:10.56 sec Goal status: MET  4.  Pt will decrease TUG to below 14 seconds/decrease in order to demonstrate decreased fall risk. Baseline:  08/21/2021=15.67 with RW 8/24: 12.41 Goal status: MET  5.  Pt will improve FOTO to target score of 56 to display perceived improvements in ability to complete ADL's.  Baseline: 08/21/2021=49 8/24: 45.6; 11/13/2021=46, 01/06/2022 = 45.6 12/21: 46 1/30: 50 Goal status: IN PROGRESS  6.  Pt will increase 10MWT by at least 0.15 m/s in order to demonstrate clinically significant improvement in community ambulation.   Baseline: 08/22/2021=0.94 m/s 8/24:1.13ms Goal status: MET  7 Pt will improve miniBESTest by at least 4 points to demonstrate clinically significant improvement in reduced falls with parkinsonian related deficits.   Baseline: 19/28; 11/13/2021- Unable to test due to time constraint- will test next session and update goal. 9/19: 18/28; 01/06/2022 - 23/28 Goal Status: MET   8. Pt will increase 6MWT by at least [id number] order to demonstrate clinically significant improvement in cardiopulmonary endurance and community ambulation  Baseline: 11/13/2021= 650 feet with walker; 01/06/2022 = 1160 feet with walker.  Goal Status: MET   9.  Patient will improve modified Oswestry disability index by 10% or greater indicating improved functional capacity secondary to low back pain Baseline: 12/28:58% 1/30:54% Goal status: IN PROGRESS  10.  Patient will demonstrate improved core stabilization and hip strength as evidenced by ability to maintain bilateral hip flexion with control lowering to plinth with exacerbation of pain Baseline: Unable to complete upon reeval, legs immediately dropped due to weakness.1/30: able to hold B LE in air but has some discomfort in back, unable to control lowering Goal  status: IN PROGRESS    ASSESSMENT:  CLINICAL IMPRESSION:  Patient presents with good motivation for completion of physical therapy activities.  Patient continues with transversus abdominis bracing for improved core stability with various activities.  Patient shows good carryover with log roll technique this session with improved ability to complete without cues from PT.  Will continue to be at target of therapy to ensure patient consistently completes this is correctly prior to discharge.  Patient also continues with hip muscle strengthening and activation with good overall results. Pt will continue to benefit from skilled physical therapy intervention to address impairments, improve QOL, and attain therapy goals.       OBJECTIVE IMPAIRMENTS Abnormal gait, decreased activity tolerance, decreased balance, decreased coordination, decreased endurance, decreased knowledge of use of DME, decreased mobility, difficulty  walking, decreased ROM, decreased strength, hypomobility, and impaired flexibility.   ACTIVITY LIMITATIONS carrying, lifting, bending, standing, squatting, sleeping, stairs, transfers, continence, bathing, toileting, and dressing  PARTICIPATION LIMITATIONS: cleaning, laundry, medication management, driving, shopping, community activity, and yard work  PERSONAL FACTORS Age are also affecting patient's functional outcome.   REHAB POTENTIAL: Good  CLINICAL DECISION MAKING: Stable/uncomplicated  EVALUATION COMPLEXITY: Low  PLAN: PT FREQUENCY: 2x/week  PT DURATION: 12 weeks  PLANNED INTERVENTIONS: Therapeutic exercises, Therapeutic activity, Neuromuscular re-education, Balance training, Gait training, Patient/Family education, Joint mobilization, Stair training, Vestibular training, Canalith repositioning, DME instructions, Dry Needling, Electrical stimulation, Spinal manipulation, and Spinal mobilization  PLAN FOR NEXT SESSION: continued targeted therapy for low back pain including lumbar and core stabilization exercises and hip strengthening.  Particia Lather PT ,DPT Physical Therapist- [facility]     04/10/22, 8:01 AM

## 2022-04-14 ENCOUNTER — Ambulatory Visit: Payer: Medicare HMO | Admitting: Physical Therapy

## 2022-04-14 ENCOUNTER — Encounter: Payer: Self-pay | Admitting: Physical Therapy

## 2022-04-14 DIAGNOSIS — R2689 Other abnormalities of gait and mobility: Secondary | ICD-10-CM

## 2022-04-14 DIAGNOSIS — M5459 Other low back pain: Secondary | ICD-10-CM | POA: Diagnosis not present

## 2022-04-14 DIAGNOSIS — R262 Difficulty in walking, not elsewhere classified: Secondary | ICD-10-CM | POA: Diagnosis not present

## 2022-04-14 DIAGNOSIS — M6281 Muscle weakness (generalized): Secondary | ICD-10-CM | POA: Diagnosis not present

## 2022-04-14 DIAGNOSIS — R2681 Unsteadiness on feet: Secondary | ICD-10-CM | POA: Diagnosis not present

## 2022-04-14 DIAGNOSIS — R269 Unspecified abnormalities of gait and mobility: Secondary | ICD-10-CM | POA: Diagnosis not present

## 2022-04-14 NOTE — Therapy (Signed)
OUTPATIENT PHYSICAL THERAPY NEURO TREATMENT  Patient Name: Joseph Arroyo MRN: [id number] DOB:09-Dec-1940, 82 y.o., male Today's Date: 04/14/2022  PCP: Joseph Hauser, DO REFERRING PROVIDER: Lavena Bullion, NP   PT End of Session - 04/14/22 1659     Visit Number 81    Number of Visits 24    Date for PT Re-Evaluation 04/16/22    Authorization Type [employer]    Authorization Time Period -04/16/22    Progress Note Due on Visit 65    PT Start Time [id number]    PT Stop Time 1515    PT Time Calculation (min) 41 min    Equipment Utilized During Treatment Gait belt    Activity Tolerance Patient tolerated treatment well;No increased pain    Behavior During Therapy [facility] for tasks assessed/performed                 Past Medical History:  Diagnosis Date   Allergic rhinitis due to pollen 11/21/2007   Brachial neuritis or radiculitis NOS    Cervicalgia    Concussion    age 29 - s/p accident   Costal chondritis    Depression    Essential and other specified forms of tremor    GERD (gastroesophageal reflux disease)    in past   Lumbago    Occlusion and stenosis of carotid artery without mention of cerebral infarction    Seizures [facility])    age 46 - after concussion   Past Surgical History:  Procedure Laterality Date   CATARACT EXTRACTION Right 07/18/14   CATARACT EXTRACTION W/PHACO Left 08/01/2014   Procedure: CATARACT EXTRACTION PHACO AND INTRAOCULAR LENS PLACEMENT ([place]);  Surgeon: Joseph Koyanagi, MD;  Location: [place];  Service: Ophthalmology;  Laterality: Left;  IVA TOPICAL   COLONOSCOPY  2008   OTHER SURGICAL HISTORY  2002   ear surgery   STAPEDES SURGERY Right 2002   Patient Active Problem List   Diagnosis Date Noted   Parkinson's disease with dyskinesia 01/27/2022   Dysphagia, pharyngeal phase 09/17/2021   Cervicalgia 08/05/2021   Spondylosis without myelopathy or radiculopathy, lumbosacral region 06/24/2021   Lumbar central spinal  stenosis, w/o neurogenic claudication (L4-5) 06/03/2021   Lumbar lateral recess stenosis (Bilateral: L2-3, L4-5) (Right: L3-4) 06/03/2021   Lumbosacral foraminal stenosis (Bilateral: L2-3, L3-4) (Left: L5-S1) 06/03/2021   Lumbar nerve root impingement (Right: L3 at L2-3 & L3-4) 06/03/2021   Ligamentum flavum hypertrophy (L3-4, L4-5) 06/03/2021   Abnormal MRI, lumbar spine (04/23/2021) 04/24/2021   Long term prescription benzodiazepine use (alprazolam) (Xanax) 03/24/2021   Grade 1 Retrolisthesis of L2/L3 (5 mm) and L3/L4 (3 mm) 03/24/2021   Levoscoliosis of lumbar spine (L3-4 apex) 03/24/2021   DDD (degenerative disc disease), lumbosacral 03/24/2021   Lumbosacral facet arthropathy (Left: L3-4, L4-5, and L5-S1) 03/24/2021   Tricompartment osteoarthritis of knee (Left) 03/24/2021   Baker cyst (Left) 03/24/2021   Chronic low back pain (1ry area of Pain) (Bilateral) (R>L) w/o sciatica 03/24/2021   Lumbar facet syndrome (Bilateral) 03/24/2021   Chronic lower extremity pain (2ry area of Pain) (Bilateral) (L>R) 03/24/2021   Lumbosacral radiculitis/sensory radiculopathy at L2 (Bilateral) 03/24/2021   Lumbosacral radiculitis/sensory radiculopathy at L3 (Bilateral) 03/24/2021   Chronic pain syndrome 03/23/2021   Pharmacologic therapy 03/23/2021   Disorder of skeletal system 03/23/2021   Problems influencing health status 03/23/2021   PAD (peripheral artery disease) ([place]) 10/01/2020   GAD (generalized anxiety disorder) 01/13/2018   MDD (major depressive disorder) [id number]   Umbilical hernia 0000000  Chronic knee pain (Left) 08/11/2017   Derangement of medial meniscus, posterior horn (Left) 08/11/2017   Vitamin D insufficiency 03/05/2016   BPH without obstruction/lower urinary tract symptoms 07/08/2015   Chronic fatigue 10/23/2014   Major depression, recurrent, full remission ([place]) 07/26/2013   Unsteady gait 07/26/2013   Orthostatic hypotension 07/26/2013   Depression 07/26/2013   Anxiety  06/23/2013   Chronic anxiety 06/23/2013   Tremor 05/18/2013   Bilateral carotid artery stenosis 08/30/2012    ONSET DATE: 08/11/2021  REFERRING DIAG: Parkinsons Disease  THERAPY DIAG:  Other low back pain  Difficulty in walking, not elsewhere classified  Other abnormalities of gait and mobility  Abnormality of gait and mobility  Unsteadiness on feet  Rationale for Evaluation and Treatment Rehabilitation  SUBJECTIVE: Pt reports similar low back pain since last session.He has been showing improvements with log roll technique. Pt daughter who is a Physiological scientist presents with him today to work on ensuring she performs home exercise program appropriately and ways that do not further exacerbate patient's pain.  Pt accompanied by: spouse  PERTINENT HISTORY: Joseph Arroyo is a 82 yo M who is referred to OPPT due to 2 falls in the setting of chronic parkinson's disease. Pt AMB with RW at baseline but is very physically active.   PAIN: Are you having pain? Yes: NPRS scale: 2/10 Pain location: low back Pain description: ache/soreness Aggravating factors: prolonged standing, transfers, walking Relieving factors: rest and medications   PATIENT GOALS  to walk better and be as active as possible. Also to get in/out of cars better.   Therex:   Placed half foam roller parallel to patient spine and performed thoracic extensions trying to keep low back in contact with base of foam roller while also conducting as much of thoracic spine with foam roller as possible.  Patient reports he likes this intervention.  X 10 repetitions for 5 seconds each  Pt demonstrates good log roll technique with transition to supine without cues this date.   Moist heat in supine for pain relief and for increased blood flow to target muscles via vasodilation   In supine bolster placed under knees   10x 5 sec hold transversus abdominis (TrA) bracing for core activation retraining  Supine  adductor  squeeze 10 x 5 sec holds reps to activate core musculature   Supine Clamshell with GTB 10x 5 sec holds   Supine TrA contraction with march 10 x cues for relaxation between each set   Log roll to get up demonstrating good technique  Further instruction provided to pt DTR for HEP carryover this session.     PATIENT EDUCATION:    Education details: Pt educated throughout session about proper posture and technique with exercises. Improved exercise technique, movement at target joints, use of target muscles after min to mod verbal, visual, tactile cues.  Person educated: Patient Education method: Explanation, Demonstration, Tactile cues, and Verbal cues Education comprehension: verbalized understanding, returned demonstration, verbal cues required, tactile cues required, and needs further education   HOME EXERCISE PROGRAM:No updates to HEP today. Pt to continue HEP as previously given  02/24/22: introduced to seated PWR! Exercises as part of HEP, step and twist modified to account for low back pain   Access Code: [email or web address] URL: [email or web address] Date: 02/06/2022 Prepared by: Sande Brothers  Exercises - Supine Transversus Abdominis Bracing - Hands on Stomach  - 1 x daily - 7 x weekly - 3 sets - 10 reps - Supine Transversus Abdominis Bracing with Double Leg  Fallout  - 1 x daily - 7 x weekly - 3 sets - 10 reps - Supine Hip Adduction Isometric with Ball  - 1 x daily - 7 x weekly - 3 sets - 10 reps   Access Code: [email or web address] URL: [email or web address] Date: 12/09/2021 Prepared by: Amalia Hailey  Exercises - Seated Alternating Side Stretch with Arm Overhead  - 1 x daily - 7 x weekly - 2 sets - 10 reps - Seated Thoracic Extension Arms Overhead  - 1 x daily - 7 x weekly - 2 sets - 10 reps  Access Code: [email or web address] URL: [email or web address] Date: 04/14/2022 Prepared by: Rivka Barbara  Exercises - Seated Thoracic Lumbar Extension  - 1  x daily - 7 x weekly - 10 reps - 5-10 second hold - Hooklying Transversus Abdominis Palpation  - 1 x daily - 7 x weekly - 1 sets - 10 reps - 5 second hold - Supine Hip Adduction Isometric with Ball  - 1 x daily - 7 x weekly - 1 sets - 10 reps - 5 sec  hold  GOALS: Goals reviewed with patient? Yes  SHORT TERM GOALS: Target date: MET   Pt will be independent with initial HEP in order to improve strength and balance in order to decrease fall risk and improve function at home and work.  Baseline: 08/21/2021-No formal HEP in place Goal status: MET   LONG TERM GOALS: Target date: 04/17/2022    Pt will improve BERG by at least 3 points in order to demonstrate clinically significant improvement in balance.   Baseline: 08/21/2021= 48/56 8/24:47/56; 11/13/2021= 47/56, 01/06/2022 = Unable to test due to time constraint- will test next session and update goal; 01/08/2022= 51/56- will keep goal active to ensure consistency.  12/21: 52/56 Goal status: Met  2.  Pt will be independent with final HEP in order to improve strength and balance in order to decrease fall risk and improve function at home and work.  Baseline: No HEP in place, 8/24: completing current HEP but still progressing; 11/13/2021- Patient continues to verbalize and demonstrate understanding of HEP for stretching/strengthening and mobility but still progressing and altering program as appropriate; 11/7: Continues follow current HEP but still progressing HEP as patient progresses 12/21: Been walking and completing core activation exercising, not doing as much balance lately.  Goal status: IN PROGRESS  3.  Pt will decrease 5TSTS by at least 4 seconds in order to demonstrate clinically significant improvement in LE strength. Baseline: 08/21/2021= 19.01 sec without UE support 8/24:10.56 sec Goal status: MET  4.  Pt will decrease TUG to below 14 seconds/decrease in order to demonstrate decreased fall risk. Baseline:  08/21/2021=15.67 with RW 8/24:  12.41 Goal status: MET  5.  Pt will improve FOTO to target score of 56 to display perceived improvements in ability to complete ADL's.  Baseline: 08/21/2021=49 8/24: 45.6; 11/13/2021=46, 01/06/2022 = 45.6 12/21: 46 1/30: 50 Goal status: IN PROGRESS  6.  Pt will increase 10MWT by at least 0.15 m/s in order to demonstrate clinically significant improvement in community ambulation.   Baseline: 08/22/2021=0.94 m/s 8/24:1.1ms Goal status: MET  7 Pt will improve miniBESTest by at least 4 points to demonstrate clinically significant improvement in reduced falls with parkinsonian related deficits.  Baseline: 19/28; 11/13/2021- Unable to test due to time constraint- will test next session and update goal. 9/19: 18/28; 01/06/2022 - 23/28 Goal Status: MET   8. Pt will increase 6MWT by at least [id number] order to demonstrate clinically significant  improvement in cardiopulmonary endurance and community ambulation  Baseline: 11/13/2021= 650 feet with walker; 01/06/2022 = 1160 feet with walker.  Goal Status: MET   9.  Patient will improve modified Oswestry disability index by 10% or greater indicating improved functional capacity secondary to low back pain Baseline: 12/28:58% 1/30:54% Goal status: IN PROGRESS  10.  Patient will demonstrate improved core stabilization and hip strength as evidenced by ability to maintain bilateral hip flexion with control lowering to plinth with exacerbation of pain Baseline: Unable to complete upon reeval, legs immediately dropped due to weakness.1/30: able to hold B LE in air but has some discomfort in back, unable to control lowering Goal status: IN PROGRESS    ASSESSMENT:  CLINICAL IMPRESSION:  Patient presents with good motivation for completion of physical therapy activities.  Patient continues with transversus abdominis bracing for improved core stability with various activities.  Patient shows good carryover with log roll technique this session with improved  ability to complete without cues from PT.  Plan to discharge next session with HEP. Pt will continue to benefit from skilled physical therapy intervention to address impairments, improve QOL, and attain therapy goals.       OBJECTIVE IMPAIRMENTS Abnormal gait, decreased activity tolerance, decreased balance, decreased coordination, decreased endurance, decreased knowledge of use of DME, decreased mobility, difficulty walking, decreased ROM, decreased strength, hypomobility, and impaired flexibility.   ACTIVITY LIMITATIONS carrying, lifting, bending, standing, squatting, sleeping, stairs, transfers, continence, bathing, toileting, and dressing  PARTICIPATION LIMITATIONS: cleaning, laundry, medication management, driving, shopping, community activity, and yard work  PERSONAL FACTORS Age are also affecting patient's functional outcome.   REHAB POTENTIAL: Good  CLINICAL DECISION MAKING: Stable/uncomplicated  EVALUATION COMPLEXITY: Low  PLAN: PT FREQUENCY: 2x/week  PT DURATION: 12 weeks  PLANNED INTERVENTIONS: Therapeutic exercises, Therapeutic activity, Neuromuscular re-education, Balance training, Gait training, Patient/Family education, Joint mobilization, Stair training, Vestibular training, Canalith repositioning, DME instructions, Dry Needling, Electrical stimulation, Spinal manipulation, and Spinal mobilization  PLAN FOR NEXT SESSION: continued targeted therapy for low back pain including lumbar and core stabilization exercises and hip strengthening.  Particia Lather PT ,DPT Physical Therapist- [facility]     04/14/22, 4:59 PM

## 2022-04-16 ENCOUNTER — Ambulatory Visit: Payer: Medicare HMO | Admitting: Physical Therapy

## 2022-04-16 DIAGNOSIS — R2689 Other abnormalities of gait and mobility: Secondary | ICD-10-CM | POA: Diagnosis not present

## 2022-04-16 DIAGNOSIS — R262 Difficulty in walking, not elsewhere classified: Secondary | ICD-10-CM | POA: Diagnosis not present

## 2022-04-16 DIAGNOSIS — R2681 Unsteadiness on feet: Secondary | ICD-10-CM | POA: Diagnosis not present

## 2022-04-16 DIAGNOSIS — R269 Unspecified abnormalities of gait and mobility: Secondary | ICD-10-CM | POA: Diagnosis not present

## 2022-04-16 DIAGNOSIS — M5459 Other low back pain: Secondary | ICD-10-CM | POA: Diagnosis not present

## 2022-04-16 DIAGNOSIS — M6281 Muscle weakness (generalized): Secondary | ICD-10-CM | POA: Diagnosis not present

## 2022-04-16 NOTE — Therapy (Signed)
OUTPATIENT PHYSICAL THERAPY NEURO TREATMENT AND DISCHARGE SUMMARY  Patient Name: Joseph Arroyo MRN: [id number] DOB:09-22-1940, 82 y.o., male Today's Date: 04/16/2022  PCP: Olin Hauser, DO REFERRING PROVIDER: Lavena Bullion, NP   PT End of Session - 04/16/22 1354     Visit Number 45    Number of Visits 90    Date for PT Re-Evaluation 04/16/22    Authorization Type [employer]    Authorization Time Period -04/16/22    Progress Note Due on Visit 28    PT Start Time 1350    PT Stop Time 1428    PT Time Calculation (min) 38 min    Equipment Utilized During Treatment Gait belt    Activity Tolerance Patient tolerated treatment well;No increased pain    Behavior During Therapy [facility] for tasks assessed/performed                  Past Medical History:  Diagnosis Date   Allergic rhinitis due to pollen 11/21/2007   Brachial neuritis or radiculitis NOS    Cervicalgia    Concussion    age 24 - s/p accident   Costal chondritis    Depression    Essential and other specified forms of tremor    GERD (gastroesophageal reflux disease)    in past   Lumbago    Occlusion and stenosis of carotid artery without mention of cerebral infarction    Seizures [facility])    age 71 - after concussion   Past Surgical History:  Procedure Laterality Date   CATARACT EXTRACTION Right 07/18/14   CATARACT EXTRACTION W/PHACO Left 08/01/2014   Procedure: CATARACT EXTRACTION PHACO AND INTRAOCULAR LENS PLACEMENT ([place]);  Surgeon: Leandrew Koyanagi, MD;  Location: [facility];  Service: Ophthalmology;  Laterality: Left;  IVA TOPICAL   COLONOSCOPY  2008   OTHER SURGICAL HISTORY  2002   ear surgery   STAPEDES SURGERY Right 2002   Patient Active Problem List   Diagnosis Date Noted   Parkinson's disease with dyskinesia 01/27/2022   Dysphagia, pharyngeal phase 09/17/2021   Cervicalgia 08/05/2021   Spondylosis without myelopathy or radiculopathy, lumbosacral region 06/24/2021    Lumbar central spinal stenosis, w/o neurogenic claudication (L4-5) 06/03/2021   Lumbar lateral recess stenosis (Bilateral: L2-3, L4-5) (Right: L3-4) 06/03/2021   Lumbosacral foraminal stenosis (Bilateral: L2-3, L3-4) (Left: L5-S1) 06/03/2021   Lumbar nerve root impingement (Right: L3 at L2-3 & L3-4) 06/03/2021   Ligamentum flavum hypertrophy (L3-4, L4-5) 06/03/2021   Abnormal MRI, lumbar spine (04/23/2021) 04/24/2021   Long term prescription benzodiazepine use (alprazolam) (Xanax) 03/24/2021   Grade 1 Retrolisthesis of L2/L3 (5 mm) and L3/L4 (3 mm) 03/24/2021   Levoscoliosis of lumbar spine (L3-4 apex) 03/24/2021   DDD (degenerative disc disease), lumbosacral 03/24/2021   Lumbosacral facet arthropathy (Left: L3-4, L4-5, and L5-S1) 03/24/2021   Tricompartment osteoarthritis of knee (Left) 03/24/2021   Baker cyst (Left) 03/24/2021   Chronic low back pain (1ry area of Pain) (Bilateral) (R>L) w/o sciatica 03/24/2021   Lumbar facet syndrome (Bilateral) 03/24/2021   Chronic lower extremity pain (2ry area of Pain) (Bilateral) (L>R) 03/24/2021   Lumbosacral radiculitis/sensory radiculopathy at L2 (Bilateral) 03/24/2021   Lumbosacral radiculitis/sensory radiculopathy at L3 (Bilateral) 03/24/2021   Chronic pain syndrome 03/23/2021   Pharmacologic therapy 03/23/2021   Disorder of skeletal system 03/23/2021   Problems influencing health status 03/23/2021   PAD (peripheral artery disease) ([place]) 10/01/2020   GAD (generalized anxiety disorder) 01/13/2018   MDD (major depressive disorder) 99991111   Umbilical  hernia 09/04/2017   Chronic knee pain (Left) 08/11/2017   Derangement of medial meniscus, posterior horn (Left) 08/11/2017   Vitamin D insufficiency 03/05/2016   BPH without obstruction/lower urinary tract symptoms 07/08/2015   Chronic fatigue 10/23/2014   Major depression, recurrent, full remission ([place]) 07/26/2013   Unsteady gait 07/26/2013   Orthostatic hypotension 07/26/2013   Depression  07/26/2013   Anxiety 06/23/2013   Chronic anxiety 06/23/2013   Tremor 05/18/2013   Bilateral carotid artery stenosis 08/30/2012    ONSET DATE: 08/11/2021  REFERRING DIAG: Parkinsons Disease  THERAPY DIAG:  No diagnosis found.  Rationale for Evaluation and Treatment Rehabilitation  SUBJECTIVE: Pt reports increased low back pain over yesterday when he was up a lot.   Pt accompanied by: spouse  PERTINENT HISTORY: Joseph Arroyo is a 82 yo M who is referred to OPPT due to 2 falls in the setting of chronic parkinson's disease. Pt AMB with RW at baseline but is very physically active.   PAIN: Are you having pain? Yes: NPRS scale: 5/10 Pain location: low back Pain description: ache/soreness Aggravating factors: prolonged standing, transfers, walking Relieving factors: rest and medications   PATIENT GOALS  to walk better and be as active as possible. Also to get in/out of cars better.   Therex:  patient seated in armchair completing thoracic extension exercise with 5-second holds with heat applied to low back for increased blood flow as well as pain modulation  Patient completes power up exercise with focus on thoracic extension.  Patient transitions to chair without arms to complete the following exercises patient completes a power step and rock exercise with slight modification to rock to account for his low back pain  Transitions to standing and completes a power up, power step, and power reach this position with unilateral upper extremity holding onto chair for support throughout  Pt educated throughout session about proper posture and technique with exercises. Improved exercise technique, movement at target joints, use of target muscles after min to mod verbal, visual, tactile cues.  Patient has handouts of all of the following above exercises as well as details regarding modifications for safety and for preventing exacerbation of low back pain    PATIENT EDUCATION:     Education details: Patient educated regarding discharge plan.  Person educated: Patient and caregiver Education method: Explanation, Demonstration, Tactile cues, and Verbal cues Education comprehension: verbalized understanding, returned demonstration, verbal cues required, tactile cues required, and needs further education   HOME EXERCISE PROGRAM:No updates to HEP today. Pt to continue HEP as previously given  02/24/22: introduced to seated PWR! Exercises as part of HEP, step and twist modified to account for low back pain   Access Code: [email or web address] URL: [email or web address] Date: 02/06/2022 Prepared by: Sande Brothers  Exercises - Supine Transversus Abdominis Bracing - Hands on Stomach  - 1 x daily - 7 x weekly - 3 sets - 10 reps - Supine Transversus Abdominis Bracing with Double Leg Fallout  - 1 x daily - 7 x weekly - 3 sets - 10 reps - Supine Hip Adduction Isometric with Ball  - 1 x daily - 7 x weekly - 3 sets - 10 reps   Access Code: [email or web address] URL: [email or web address] Date: 12/09/2021 Prepared by: Amalia Hailey  Exercises - Seated Alternating Side Stretch with Arm Overhead  - 1 x daily - 7 x weekly - 2 sets - 10 reps - Seated Thoracic Extension Arms Overhead  - 1 x daily - 7 x weekly - 2 sets -  10 reps  Access Code: [email or web address] URL: [email or web address] Date: 04/14/2022 Prepared by: Rivka Barbara  Exercises - Seated Thoracic Lumbar Extension  - 1 x daily - 7 x weekly - 10 reps - 5-10 second hold - Hooklying Transversus Abdominis Palpation  - 1 x daily - 7 x weekly - 1 sets - 10 reps - 5 second hold - Supine Hip Adduction Isometric with Ball  - 1 x daily - 7 x weekly - 1 sets - 10 reps - 5 sec  hold  GOALS: Goals reviewed with patient? Yes  SHORT TERM GOALS: Target date: MET   Pt will be independent with initial HEP in order to improve strength and balance in order to decrease fall risk and improve function at home  and work.  Baseline: 08/21/2021-No formal HEP in place Goal status: MET   LONG TERM GOALS: Target date: 04/17/2022    Pt will improve BERG by at least 3 points in order to demonstrate clinically significant improvement in balance.   Baseline: 08/21/2021= 48/56 8/24:47/56; 11/13/2021= 47/56, 01/06/2022 = Unable to test due to time constraint- will test next session and update goal; 01/08/2022= 51/56- will keep goal active to ensure consistency.  12/21: 52/56 Goal status: Met  2.  Pt will be independent with final HEP in order to improve strength and balance in order to decrease fall risk and improve function at home and work.  Baseline: No HEP in place, 8/24: completing current HEP but still progressing; 11/13/2021- Patient continues to verbalize and demonstrate understanding of HEP for stretching/strengthening and mobility but still progressing and altering program as appropriate; 11/7: Continues follow current HEP but still progressing HEP as patient progresses 12/21: Been walking and completing core activation exercising, not doing as much balance lately. 2/16: familiar with both LE HE and low back HEP and significant time of education spent on bot over the course of therapy.  Goal status: MET  3.  Pt will decrease 5TSTS by at least 4 seconds in order to demonstrate clinically significant improvement in LE strength. Baseline: 08/21/2021= 19.01 sec without UE support 8/24:10.56 sec Goal status: MET  4.  Pt will decrease TUG to below 14 seconds/decrease in order to demonstrate decreased fall risk. Baseline:  08/21/2021=15.67 with RW 8/24: 12.41 Goal status: MET  5.  Pt will improve FOTO to target score of 56 to display perceived improvements in ability to complete ADL's.  Baseline: 08/21/2021=49 8/24: 45.6; 11/13/2021=46, 01/06/2022 = 45.6 12/21: 46 1/30: 50 2/15:45.6 % Goal status: NOT MET  6.  Pt will increase 10MWT by at least 0.15 m/s in order to demonstrate clinically significant improvement in  community ambulation.   Baseline: 08/22/2021=0.94 m/s 8/24:1.59ms Goal status: MET  7 Pt will improve miniBESTest by at least 4 points to demonstrate clinically significant improvement in reduced falls with parkinsonian related deficits.  Baseline: 19/28; 11/13/2021- Unable to test due to time constraint- will test next session and update goal. 9/19: 18/28; 01/06/2022 - 23/28 Goal Status: MET   8. Pt will increase 6MWT by at least [id number] order to demonstrate clinically significant improvement in cardiopulmonary endurance and community ambulation  Baseline: 11/13/2021= 650 feet with walker; 01/06/2022 = 1160 feet with walker.  Goal Status: MET   9.  Patient will improve modified Oswestry disability index by 10% or greater indicating improved functional capacity secondary to low back pain Baseline: 12/28:58% 1/30:54% Goal status: NOT MET  10.  Patient will demonstrate improved core stabilization and hip strength as evidenced by ability  to maintain bilateral hip flexion with control lowering to plinth with exacerbation of pain Baseline: Unable to complete upon reeval, legs immediately dropped due to weakness.1/30: able to hold B LE in air but has some discomfort in back, unable to control lowering  Goal status: NOT MET    ASSESSMENT:  CLINICAL IMPRESSION:  Patient presents to physical therapy session for discharge therapy this date.  Patient has previously met all goals for his balance and reduction of fall risk and experience no falls in recent months.  Patient has made some progress toward his goals established for his low back pain but with chronicity of this ailment as well as significant structural changes due to scoliosis in addition to Parkinson's he has reached his potential with physical therapy for his low back pain and discomfort.  Patient does still have frequent low back pain but incidence of significant low back pain has been decreased consistently when he presents to  physical therapy.  Patient is provided with home exercise program to both target his movement disorder and Parkinson's as well as his lumbar spine and core stability.  Patient also private significant education regarding ways to minimize his low back discomfort through compensatory movements.  Patient will be discharged physical therapy at this time with home exercise program.     OBJECTIVE IMPAIRMENTS Abnormal gait, decreased activity tolerance, decreased balance, decreased coordination, decreased endurance, decreased knowledge of use of DME, decreased mobility, difficulty walking, decreased ROM, decreased strength, hypomobility, and impaired flexibility.   ACTIVITY LIMITATIONS carrying, lifting, bending, standing, squatting, sleeping, stairs, transfers, continence, bathing, toileting, and dressing  PARTICIPATION LIMITATIONS: cleaning, laundry, medication management, driving, shopping, community activity, and yard work  PERSONAL FACTORS Age are also affecting patient's functional outcome.   REHAB POTENTIAL: Good  CLINICAL DECISION MAKING: Stable/uncomplicated  EVALUATION COMPLEXITY: Low  PLAN: PT FREQUENCY: 2x/week  PT DURATION: 12 weeks  PLANNED INTERVENTIONS: Therapeutic exercises, Therapeutic activity, Neuromuscular re-education, Balance training, Gait training, Patient/Family education, Joint mobilization, Stair training, Vestibular training, Canalith repositioning, DME instructions, Dry Needling, Electrical stimulation, Spinal manipulation, and Spinal mobilization  PLAN FOR NEXT SESSION: continued targeted therapy for low back pain including lumbar and core stabilization exercises and hip strengthening.  Particia Lather PT ,DPT Physical Therapist- [facility]     04/16/22, 1:55 PM

## 2022-04-20 DIAGNOSIS — Z961 Presence of intraocular lens: Secondary | ICD-10-CM | POA: Diagnosis not present

## 2022-04-20 DIAGNOSIS — H353131 Nonexudative age-related macular degeneration, bilateral, early dry stage: Secondary | ICD-10-CM | POA: Diagnosis not present

## 2022-04-20 DIAGNOSIS — H43813 Vitreous degeneration, bilateral: Secondary | ICD-10-CM | POA: Diagnosis not present

## 2022-04-21 ENCOUNTER — Ambulatory Visit: Payer: Medicare HMO | Admitting: Physical Therapy

## 2022-04-23 ENCOUNTER — Ambulatory Visit: Payer: Medicare HMO | Admitting: Physical Therapy

## 2022-04-28 ENCOUNTER — Ambulatory Visit: Payer: Medicare HMO | Admitting: Physical Therapy

## 2022-04-28 NOTE — Progress Notes (Unsigned)
PROVIDER NOTE: Information contained herein reflects review and annotations entered in association with encounter. Interpretation of such information and data should be left to medically-trained personnel. Information provided to patient can be located elsewhere in the medical record under "Patient Instructions". Document created using STT-dictation technology, any transcriptional errors that may result from process are unintentional.    Patient: Joseph Arroyo  Service Category: E/M  Provider: Gaspar Cola, MD  DOB: 14-Jun-1940  DOS: 04/29/2022  Referring Provider: Nobie Putnam *  MRN: [id number]  Specialty: Interventional Pain Management  PCP: Olin Hauser, DO  Type: Established Patient  Setting: Ambulatory outpatient    Location: Office  Delivery: Face-to-face     HPI  [name], a 82 y.o. year old male, is here today because of his No primary diagnosis found.. Joseph Arroyo's primary complain today is No chief complaint on file. Last encounter: My last encounter with him was on 02/10/2022. Pertinent problems: Joseph Arroyo has Chronic knee pain (Left); Derangement of medial meniscus, posterior horn (Left); PAD (peripheral artery disease) ([place]); Chronic pain syndrome; Grade 1 Retrolisthesis of L2/L3 (5 mm) and L3/L4 (3 mm); Levoscoliosis of lumbar spine (L3-4 apex); DDD (degenerative disc disease), lumbosacral; Lumbosacral facet arthropathy (Left: L3-4, L4-5, and L5-S1); Tricompartment osteoarthritis of knee (Left); Baker cyst (Left); Chronic low back pain (1ry area of Pain) (Bilateral) (R>L) w/o sciatica; Lumbar facet syndrome (Bilateral); Chronic lower extremity pain (2ry area of Pain) (Bilateral) (L>R); Lumbosacral radiculitis/sensory radiculopathy at L2 (Bilateral); Lumbosacral radiculitis/sensory radiculopathy at L3 (Bilateral); Abnormal MRI, lumbar spine (04/23/2021); Lumbar central spinal stenosis, w/o neurogenic claudication (L4-5); Lumbar lateral  recess stenosis (Bilateral: L2-3, L4-5) (Right: L3-4); Lumbosacral foraminal stenosis (Bilateral: L2-3, L3-4) (Left: L5-S1); Lumbar nerve root impingement (Right: L3 at L2-3 & L3-4); Ligamentum flavum hypertrophy (L3-4, L4-5); Spondylosis without myelopathy or radiculopathy, lumbosacral region; Cervicalgia; and Parkinson's disease with dyskinesia on their pertinent problem list. Pain Assessment: Severity of   is reported as a  /10. Location:    / . Onset:  . Quality:  . Timing:  . Modifying factor(s):  Marland Kitchen Vitals:  vitals were not taken for this visit.  BMI: Estimated body mass index is 20.36 kg/m as calculated from the following:   Height as of 02/10/22: '5\' 7"'$  (1.702 m).   Weight as of 02/10/22: 130 lb (59 kg).  Reason for encounter:  *** . ***  Pharmacotherapy Assessment  Analgesic: None MME/day: 0 mg/day   Monitoring: [place] PMP: PDMP reviewed during this encounter.       Pharmacotherapy: No side-effects or adverse reactions reported. Compliance: No problems identified. Effectiveness: Clinically acceptable.  No notes on file  No results found for: "CBDTHCR" No results found for: "D8THCCBX" No results found for: "D9THCCBX"  UDS:  Summary  Date Value Ref Range Status  03/25/2021 Note  Final    Comment:    ==================================================================== Compliance Drug Analysis, Ur ==================================================================== Test                             Result       Flag       Units  Drug Present and Declared for Prescription Verification   Gabapentin                     PRESENT      EXPECTED   Baclofen  PRESENT      EXPECTED   Paroxetine                     PRESENT      EXPECTED   Ibuprofen                      PRESENT      EXPECTED  Drug Present not Declared for Prescription Verification   Acetaminophen                  PRESENT      UNEXPECTED  Drug Absent but Declared for Prescription Verification    Alprazolam                     Not Detected UNEXPECTED ng/mg creat ==================================================================== Test                      Result    Flag   Units      Ref Range   Creatinine              122              mg/dL      >=20 ==================================================================== Declared Medications:  The flagging and interpretation on this report are based on the  following declared medications.  Unexpected results may arise from  inaccuracies in the declared medications.   **Note: The testing scope of this panel includes these medications:   Alprazolam  Baclofen (Lioresal)  Gabapentin (Neurontin)  Paroxetine (Paxil)   **Note: The testing scope of this panel does not include small to  moderate amounts of these reported medications:   Ibuprofen (Advil)   **Note: The testing scope of this panel does not include the  following reported medications:   Betamethasone (Lotrisone)  Carbidopa (Sinemet)  Clotrimazole (Lotrisone)  Folic Acid  Levodopa (Sinemet)  Lutein  Magnesium  Omega-3 Fatty Acids  Pramipexole (Mirapex)  Supplement  Ubiquinone (CoQ10)  Vitamin B ==================================================================== For clinical consultation, please call [phone]. ====================================================================       ROS  Constitutional: Denies any fever or chills Gastrointestinal: No reported hemesis, hematochezia, vomiting, or acute GI distress Musculoskeletal: Denies any acute onset joint swelling, redness, loss of ROM, or weakness Neurological: No reported episodes of acute onset apraxia, aphasia, dysarthria, agnosia, amnesia, paralysis, loss of coordination, or loss of consciousness  Medication Review  ALPRAZolam, B Complex Vitamins, Capsicum-Garlic, [place], L-Methylfolate, Lutein, PARoxetine, Psyllium, Saw Palmetto (Serenoa repens), acetaminophen, baclofen, carbidopa-levodopa,  cloNIDine, gabapentin, and pramipexole  History Review  Allergy: Joseph Arroyo is allergic to prednisone and wheat bran. Drug: Joseph Arroyo  reports no history of drug use. Alcohol:  reports no history of alcohol use. Tobacco:  reports that he has never smoked. He has never used smokeless tobacco. Social: Joseph Arroyo  reports that he has never smoked. He has never used smokeless tobacco. He reports that he does not drink alcohol and does not use drugs. Medical:  has a past medical history of Allergic rhinitis due to pollen (11/21/2007), Brachial neuritis or radiculitis NOS, Cervicalgia, Concussion, Costal chondritis, Depression, Essential and other specified forms of tremor, GERD (gastroesophageal reflux disease), Lumbago, Occlusion and stenosis of carotid artery without mention of cerebral infarction, and Seizures ([place]). Surgical: Joseph Arroyo  has a past surgical history that includes Other surgical history (2002); Colonoscopy (2008); Stapedes surgery (Right, 2002); Cataract extraction (Right, 07/18/14); and Cataract extraction w/PHACO (Left, 08/01/2014). Family: family history includes Asthma  in his father; Heart failure in his mother.  Laboratory Chemistry Profile   Renal Lab Results  Component Value Date   BUN 20 10/10/2021   CREATININE 0.77 10/10/2021   BCR SEE NOTE: 10/10/2021   GFRAA 100 09/07/2019   GFRNONAA >60 03/24/2021    Hepatic Lab Results  Component Value Date   AST 16 10/10/2021   ALT 6 (L) 10/10/2021   ALBUMIN 4.1 03/24/2021   ALKPHOS 51 03/24/2021    Electrolytes Lab Results  Component Value Date   NA 133 (L) 10/10/2021   K 4.2 10/10/2021   CL 97 (L) 10/10/2021   CALCIUM 9.0 10/10/2021   MG 2.0 03/24/2021    Bone Lab Results  Component Value Date   VD25OH 42 10/10/2021   25OHVITD1 32 03/24/2021   25OHVITD2 <1.0 03/24/2021   25OHVITD3 31 03/24/2021    Inflammation (CRP: Acute Phase) (ESR: Chronic Phase) Lab Results  Component Value Date   CRP 0.5  03/24/2021   ESRSEDRATE 4 03/24/2021         Note: Above Lab results reviewed.  Recent Imaging Review  DG PAIN CLINIC C-ARM 1-60 MIN NO REPORT Fluoro was used, but no Radiologist interpretation will be provided.  Please refer to "NOTES" tab for provider progress note. Note: Reviewed        Physical Exam  General appearance: Well nourished, well developed, and well hydrated. In no apparent acute distress Mental status: Alert, oriented x 3 (person, place, & time)       Respiratory: No evidence of acute respiratory distress Eyes: PERLA Vitals: There were no vitals taken for this visit. BMI: Estimated body mass index is 20.36 kg/m as calculated from the following:   Height as of 02/10/22: '5\' 7"'$  (1.702 m).   Weight as of 02/10/22: 130 lb (59 kg). Ideal: Patient weight not recorded  Assessment   Diagnosis Status  No diagnosis found. Controlled Controlled Controlled   Updated Problems: No problems updated.  Plan of Care  Problem-specific:  No problem-specific Assessment & Plan notes found for this encounter.  Joseph Arroyo has a current medication list which includes the following long-term medication(s): clonidine, gabapentin, paroxetine, and paroxetine.  Pharmacotherapy (Medications Ordered): No orders of the defined types were placed in this encounter.  Orders:  No orders of the defined types were placed in this encounter.  Follow-up plan:   No follow-ups on file.      Interventional Therapies  Risk  Complexity Considerations:   Estimated body mass index is 20.98 kg/m as calculated from the following:   Height as of this encounter: '5\' 8"'$  (1.727 m).   Weight as of this encounter: 138 lb (62.6 kg).    Levoscoliosis (L3-4 apex)  Parkinsons ([place] w/ dyskinesia)  SENSITIVITY: Oral Prednisone (Hyperactivity)   Planned  Pending:   Diagnostic right L1-2 LESI #2 (01/27/2022)    Under consideration:   Diagnostic bilateral L2-3 & L3-4 lumbar facet  (L1-4) MBB #2  Diagnostic x-rays of the cervical spine for further evaluation of his cervicalgia.   Completed:   Diagnostic bilateral L2-3 & L3-4 Facet Blk (L1-4 MBB) x1 (75/75/75 x2 weeks)  Diagnostic bilateral lumbar facet (L2-S1) MBB x1 (07/08/2021) (50/50/50/<50)  Diagnostic right L1-2 LESI x1 (08/26/2021) (100/100/50/50)  Diagnostic right L2-3 LESI x1 (06/03/2021) (100/100/45/45)  Referral to physical therapy for LB PT as well as a back brace eval. (04/28/2021) (water aerobics)    Therapeutic  Palliative (PRN) options:   None established  Recent Visits Date Type Provider Dept  02/10/22 Office Visit Milinda Pointer, MD [facility]  Showing recent visits within past 90 days and meeting all other requirements Future Appointments Date Type Provider Dept  04/29/22 Appointment Milinda Pointer, MD [facility]  Showing future appointments within next 90 days and meeting all other requirements  I discussed the assessment and treatment plan with the patient. The patient was provided an opportunity to ask questions and all were answered. The patient agreed with the plan and demonstrated an understanding of the instructions.  Patient advised to call back or seek an in-person evaluation if the symptoms or condition worsens.  Duration of encounter: *** minutes.  Total time on encounter, as per AMA guidelines included both the face-to-face and non-face-to-face time personally spent by the physician and/or other qualified health care professional(s) on the day of the encounter (includes time in activities that require the physician or other qualified health care professional and does not include time in activities normally performed by clinical staff). Physician's time may include the following activities when performed: Preparing to see the patient (e.g., pre-charting review of records, searching for previously ordered imaging, lab work, and nerve conduction  tests) Review of prior analgesic pharmacotherapies. Reviewing PMP Interpreting ordered tests (e.g., lab work, imaging, nerve conduction tests) Performing post-procedure evaluations, including interpretation of diagnostic procedures Obtaining and/or reviewing separately obtained history Performing a medically appropriate examination and/or evaluation Counseling and educating the patient/family/caregiver Ordering medications, tests, or procedures Referring and communicating with other health care professionals (when not separately reported) Documenting clinical information in the electronic or other health record Independently interpreting results (not separately reported) and communicating results to the patient/ family/caregiver Care coordination (not separately reported)  Note by: Gaspar Cola, MD Date: 04/29/2022; Time: 12:58 PM

## 2022-04-29 ENCOUNTER — Ambulatory Visit: Payer: Medicare HMO | Attending: Pain Medicine | Admitting: Pain Medicine

## 2022-04-29 ENCOUNTER — Encounter: Payer: Self-pay | Admitting: Pain Medicine

## 2022-04-29 VITALS — BP 101/58 | HR 58 | Temp 97.1°F | Ht 66.0 in | Wt 130.0 lb

## 2022-04-29 DIAGNOSIS — M79605 Pain in left leg: Secondary | ICD-10-CM | POA: Insufficient documentation

## 2022-04-29 DIAGNOSIS — M5416 Radiculopathy, lumbar region: Secondary | ICD-10-CM

## 2022-04-29 DIAGNOSIS — M79604 Pain in right leg: Secondary | ICD-10-CM | POA: Insufficient documentation

## 2022-04-29 DIAGNOSIS — M4807 Spinal stenosis, lumbosacral region: Secondary | ICD-10-CM | POA: Diagnosis not present

## 2022-04-29 DIAGNOSIS — M2428 Disorder of ligament, vertebrae: Secondary | ICD-10-CM | POA: Insufficient documentation

## 2022-04-29 DIAGNOSIS — M545 Low back pain, unspecified: Secondary | ICD-10-CM | POA: Diagnosis not present

## 2022-04-29 DIAGNOSIS — G894 Chronic pain syndrome: Secondary | ICD-10-CM

## 2022-04-29 DIAGNOSIS — R937 Abnormal findings on diagnostic imaging of other parts of musculoskeletal system: Secondary | ICD-10-CM | POA: Insufficient documentation

## 2022-04-29 DIAGNOSIS — M5417 Radiculopathy, lumbosacral region: Secondary | ICD-10-CM | POA: Diagnosis not present

## 2022-04-29 DIAGNOSIS — M48061 Spinal stenosis, lumbar region without neurogenic claudication: Secondary | ICD-10-CM | POA: Insufficient documentation

## 2022-04-29 DIAGNOSIS — M4310 Spondylolisthesis, site unspecified: Secondary | ICD-10-CM | POA: Insufficient documentation

## 2022-04-29 DIAGNOSIS — M4316 Spondylolisthesis, lumbar region: Secondary | ICD-10-CM

## 2022-04-29 DIAGNOSIS — G8929 Other chronic pain: Secondary | ICD-10-CM

## 2022-04-29 NOTE — Patient Instructions (Signed)

## 2022-04-29 NOTE — Progress Notes (Signed)
Safety precautions to be maintained throughout the outpatient stay will include: orient to surroundings, keep bed in low position, maintain call bell within reach at all times, provide assistance with transfer out of bed and ambulation.  

## 2022-04-30 ENCOUNTER — Ambulatory Visit: Payer: Medicare HMO | Admitting: Physical Therapy

## 2022-05-05 ENCOUNTER — Ambulatory Visit: Payer: Medicare HMO | Admitting: Physical Therapy

## 2022-05-07 ENCOUNTER — Ambulatory Visit: Payer: Medicare HMO | Admitting: Physical Therapy

## 2022-05-07 ENCOUNTER — Ambulatory Visit
Admission: RE | Admit: 2022-05-07 | Discharge: 2022-05-07 | Disposition: A | Discharge status: Home / Self Care | Payer: Medicare HMO | Source: Ambulatory Visit | Attending: Pain Medicine | Admitting: Pain Medicine

## 2022-05-07 ENCOUNTER — Ambulatory Visit: Payer: Medicare HMO | Attending: Pain Medicine | Admitting: Pain Medicine

## 2022-05-07 ENCOUNTER — Encounter: Payer: Self-pay | Admitting: Pain Medicine

## 2022-05-07 VITALS — BP 130/74 | HR 54 | Temp 97.7°F | Resp 14 | Ht 67.0 in | Wt 125.0 lb

## 2022-05-07 DIAGNOSIS — G8929 Other chronic pain: Secondary | ICD-10-CM

## 2022-05-07 DIAGNOSIS — M79604 Pain in right leg: Secondary | ICD-10-CM | POA: Insufficient documentation

## 2022-05-07 DIAGNOSIS — M4310 Spondylolisthesis, site unspecified: Secondary | ICD-10-CM | POA: Diagnosis not present

## 2022-05-07 DIAGNOSIS — M4807 Spinal stenosis, lumbosacral region: Secondary | ICD-10-CM

## 2022-05-07 DIAGNOSIS — M2428 Disorder of ligament, vertebrae: Secondary | ICD-10-CM | POA: Diagnosis not present

## 2022-05-07 DIAGNOSIS — M5417 Radiculopathy, lumbosacral region: Secondary | ICD-10-CM

## 2022-05-07 DIAGNOSIS — M4186 Other forms of scoliosis, lumbar region: Secondary | ICD-10-CM | POA: Diagnosis not present

## 2022-05-07 DIAGNOSIS — M5137 Other intervertebral disc degeneration, lumbosacral region: Secondary | ICD-10-CM | POA: Insufficient documentation

## 2022-05-07 DIAGNOSIS — R937 Abnormal findings on diagnostic imaging of other parts of musculoskeletal system: Secondary | ICD-10-CM | POA: Diagnosis not present

## 2022-05-07 DIAGNOSIS — M48061 Spinal stenosis, lumbar region without neurogenic claudication: Secondary | ICD-10-CM

## 2022-05-07 DIAGNOSIS — M5416 Radiculopathy, lumbar region: Secondary | ICD-10-CM | POA: Diagnosis not present

## 2022-05-07 DIAGNOSIS — M51379 Other intervertebral disc degeneration, lumbosacral region without mention of lumbar back pain or lower extremity pain: Secondary | ICD-10-CM

## 2022-05-07 DIAGNOSIS — G20B1 Parkinson's disease with dyskinesia, without mention of fluctuations: Secondary | ICD-10-CM

## 2022-05-07 DIAGNOSIS — M79605 Pain in left leg: Secondary | ICD-10-CM | POA: Diagnosis not present

## 2022-05-07 DIAGNOSIS — M545 Low back pain, unspecified: Secondary | ICD-10-CM | POA: Insufficient documentation

## 2022-05-07 MED ORDER — ROPIVACAINE HCL 2 MG/ML IJ SOLN
2.0000 mL | Freq: Once | INTRAMUSCULAR | Status: AC
  Administered 2022-05-07: 20 mL via EPIDURAL

## 2022-05-07 MED ORDER — MIDAZOLAM HCL 2 MG/2ML IJ SOLN
INTRAMUSCULAR | Status: AC
  Filled 2022-05-07: qty 2

## 2022-05-07 MED ORDER — DEXAMETHASONE SODIUM PHOSPHATE 10 MG/ML IJ SOLN
INTRAMUSCULAR | Status: AC
  Filled 2022-05-07: qty 1

## 2022-05-07 MED ORDER — ROPIVACAINE HCL 2 MG/ML IJ SOLN
INTRAMUSCULAR | Status: AC
  Filled 2022-05-07: qty 20

## 2022-05-07 MED ORDER — MIDAZOLAM HCL 2 MG/2ML IJ SOLN
0.5000 mg | Freq: Once | INTRAMUSCULAR | Status: AC
  Administered 2022-05-07: 1.5 mg via INTRAVENOUS

## 2022-05-07 MED ORDER — LIDOCAINE HCL 2 % IJ SOLN
20.0000 mL | Freq: Once | INTRAMUSCULAR | Status: AC
  Administered 2022-05-07: 400 mg
  Filled 2022-05-07: qty 20

## 2022-05-07 MED ORDER — IOHEXOL 180 MG/ML  SOLN
INTRAMUSCULAR | Status: AC
  Filled 2022-05-07: qty 20

## 2022-05-07 MED ORDER — SODIUM CHLORIDE (PF) 0.9 % IJ SOLN
INTRAMUSCULAR | Status: AC
  Filled 2022-05-07: qty 10

## 2022-05-07 MED ORDER — DEXAMETHASONE SODIUM PHOSPHATE 10 MG/ML IJ SOLN
20.0000 mg | Freq: Once | INTRAMUSCULAR | Status: AC
  Administered 2022-05-07: 10 mg

## 2022-05-07 MED ORDER — LACTATED RINGERS IV SOLN: Freq: Once | INTRAVENOUS | Status: DC

## 2022-05-07 MED ORDER — SODIUM CHLORIDE 0.9% FLUSH
2.0000 mL | Freq: Once | INTRAVENOUS | Status: AC
  Administered 2022-05-07: 10 mL

## 2022-05-07 MED ORDER — IOHEXOL 180 MG/ML  SOLN
10.0000 mL | Freq: Once | INTRAMUSCULAR | Status: AC
  Administered 2022-05-07: 10 mL via INTRATHECAL

## 2022-05-07 MED ORDER — PENTAFLUOROPROP-TETRAFLUOROETH EX AERO
INHALATION_SPRAY | Freq: Once | CUTANEOUS | Status: AC
  Administered 2022-05-07: 30 via TOPICAL

## 2022-05-07 NOTE — Progress Notes (Signed)
PROVIDER NOTE: Interpretation of information contained herein should be left to medically-trained personnel. Specific patient instructions are provided elsewhere under "Patient Instructions" section of medical record. This document was created in part using STT-dictation technology, any transcriptional errors that may result from this process are unintentional.  Patient: Joseph Arroyo Type: Established DOB: 06-12-1940 MRN: [id number] PCP: Olin Hauser, DO  Service: Procedure DOS: 05/07/2022 Setting: Ambulatory Location: Ambulatory outpatient facility Delivery: Face-to-face Provider: Gaspar Cola, MD Specialty: Interventional Pain Management Specialty designation: 09 Location: Outpatient facility Ref. Prov.: Milinda Pointer, MD       Interventional Therapy   Procedure: Lumbar trans-foraminal epidural steroid injection (L-TFESI) #1  Laterality: Bilateral (-50)  Level: L2 & L3 nerve root(s) Imaging: Fluoroscopy-guided         Anesthesia: Local anesthesia (1-2% Lidocaine) Anxiolysis: IV Versed 1.5 mg Sedation: No Sedation                       DOS: 05/07/2022  Performed by: Gaspar Cola, MD  Purpose: Diagnostic/Therapeutic Indications: Lumbar radicular pain severe enough to impact quality of life or function. 1. Lumbosacral radiculitis/sensory radiculopathy at L2 (Bilateral)   2. Lumbosacral radiculitis/sensory radiculopathy at L3 (Bilateral)   3. Lumbar nerve root impingement (Right: L3 at L2-3 & L3-4)   4. Grade 1 Retrolisthesis of L2/L3 (5 mm) and L3/L4 (3 mm)   5. Lumbosacral foraminal stenosis (Bilateral: L2-3, L3-4) (Left: L5-S1)   6. Lumbar lateral recess stenosis (Bilateral: L2-3, L4-5) (Right: L3-4)   7. Levoscoliosis of lumbar spine (L3-4 apex)   8. DDD (degenerative disc disease), lumbosacral   9. Chronic low back pain (1ry area of Pain) (Bilateral) (R>L) w/o sciatica   10. Chronic lower extremity pain (2ry area of Pain) (Bilateral) (L>R)    11. Parkinson's disease with dyskinesia, unspecified whether manifestations fluctuate    NAS-11 Pain score:   Pre-procedure: 8 /10   Post-procedure: 0-No pain/10     Position / Prep / Materials:  Position: Prone  Prep solution: DuraPrep (Iodine Povacrylex [0.7% available iodine] and Isopropyl Alcohol, 74% w/w) Prep Area: Entire Posterior Lumbosacral Area.  From the lower tip of the scapula down to the tailbone and from flank to flank. Materials:  Tray: Block Needle(s):  Type: Spinal  Gauge (G): 22  Length: 5-in  Qty: 2      Pre-op H&P Assessment:  Joseph Arroyo is a 82 y.o. (year old), male patient, seen today for interventional treatment. He  has a past surgical history that includes Other surgical history (2002); Colonoscopy (2008); Stapedes surgery (Right, 2002); Cataract extraction (Right, 07/18/14); and Cataract extraction w/PHACO (Left, 08/01/2014). Joseph Arroyo has a current medication list which includes the following prescription(s): acetaminophen, alprazolam, b complex vitamins, baclofen, capsicum-garlic, carbidopa-levodopa, clonidine, gabapentin, hawthorn, l-methylfolate, lutein, paroxetine, paroxetine, pramipexole, psyllium, and saw palmetto (serenoa repens), and the following Facility-Administered Medications: lactated ringers. His primarily concern today is the Back Pain (Lower bilaterally)  Initial Vital Signs:  Pulse/HCG Rate: (!) 54ECG Heart Rate: (!) 57 Temp: (!) 97.3 F (36.3 C) Resp: 14 BP: 106/63 SpO2: 97 %  BMI: Estimated body mass index is 19.58 kg/m as calculated from the following:   Height as of this encounter: '5\' 7"'$  (1.702 m).   Weight as of this encounter: 125 lb (56.7 kg).  Risk Assessment: Allergies: Reviewed. He is allergic to prednisone and wheat.  Allergy Precautions: None required Coagulopathies: Reviewed. None identified.  Blood-thinner therapy: None at this time Active Infection(s): Reviewed. None identified. Mr.  Arroyo is  afebrile  Site Confirmation: Joseph Arroyo was asked to confirm the procedure and laterality before marking the site Procedure checklist: Completed Consent: Before the procedure and under the influence of no sedative(s), amnesic(s), or anxiolytics, the patient was informed of the treatment options, risks and possible complications. To fulfill our ethical and legal obligations, as recommended by the American Medical Association's Code of Ethics, I have informed the patient of my clinical impression; the nature and purpose of the treatment or procedure; the risks, benefits, and possible complications of the intervention; the alternatives, including doing nothing; the risk(s) and benefit(s) of the alternative treatment(s) or procedure(s); and the risk(s) and benefit(s) of doing nothing. The patient was provided information about the general risks and possible complications associated with the procedure. These may include, but are not limited to: failure to achieve desired goals, infection, bleeding, organ or nerve damage, allergic reactions, paralysis, and death. In addition, the patient was informed of those risks and complications associated to Spine-related procedures, such as failure to decrease pain; infection (i.e.: Meningitis, epidural or intraspinal abscess); bleeding (i.e.: epidural hematoma, subarachnoid hemorrhage, or any other type of intraspinal or peri-dural bleeding); organ or nerve damage (i.e.: Any type of peripheral nerve, nerve root, or spinal cord injury) with subsequent damage to sensory, motor, and/or autonomic systems, resulting in permanent pain, numbness, and/or weakness of one or several areas of the body; allergic reactions; (i.e.: anaphylactic reaction); and/or death. Furthermore, the patient was informed of those risks and complications associated with the medications. These include, but are not limited to: allergic reactions (i.e.: anaphylactic or anaphylactoid reaction(s)); adrenal  axis suppression; blood sugar elevation that in diabetics may result in ketoacidosis or comma; water retention that in patients with history of congestive heart failure may result in shortness of breath, pulmonary edema, and decompensation with resultant heart failure; weight gain; swelling or edema; medication-induced neural toxicity; particulate matter embolism and blood vessel occlusion with resultant organ, and/or nervous system infarction; and/or aseptic necrosis of one or more joints. Finally, the patient was informed that Medicine is not an exact science; therefore, there is also the possibility of unforeseen or unpredictable risks and/or possible complications that may result in a catastrophic outcome. The patient indicated having understood very clearly. We have given the patient no guarantees and we have made no promises. Enough time was given to the patient to ask questions, all of which were answered to the patient's satisfaction. Mr. Reynolds has indicated that he wanted to continue with the procedure. Attestation: I, the ordering provider, attest that I have discussed with the patient the benefits, risks, side-effects, alternatives, likelihood of achieving goals, and potential problems during recovery for the procedure that I have provided informed consent. Date  Time: 05/07/2022  9:14 AM   Pre-Procedure Preparation:  Monitoring: As per clinic protocol. Respiration, ETCO2, SpO2, BP, heart rate and rhythm monitor placed and checked for adequate function Safety Precautions: Patient was assessed for positional comfort and pressure points before starting the procedure. Time-out: I initiated and conducted the "Time-out" before starting the procedure, as per protocol. The patient was asked to participate by confirming the accuracy of the "Time Out" information. Verification of the correct person, site, and procedure were performed and confirmed by me, the nursing staff, and the patient. "Time-out"  conducted as per Joint Commission's Universal Protocol (UP.01.01.01). Time: [id number]  Description/Narrative of Procedure:          Target: The 6 o'clock position under the pedicle, on the affected side. Region:  Posterolateral Lumbosacral Approach: Posterior Percutaneous Paravertebral approach.  Rationale (medical necessity): procedure needed and proper for the diagnosis and/or treatment of the patient's medical symptoms and needs. Procedural Technique Safety Precautions: Aspiration looking for blood return was conducted prior to all injections. At no point did we inject any substances, as a needle was being advanced. No attempts were made at seeking any paresthesias. Safe injection practices and needle disposal techniques used. Medications properly checked for expiration dates. SDV (single dose vial) medications used. Description of the Procedure: Protocol guidelines were followed. The patient was placed in position over the procedure table. The target area was identified and the area prepped in the usual manner. Skin & deeper tissues infiltrated with local anesthetic. Appropriate amount of time allowed to pass for local anesthetics to take effect. The procedure needles were then advanced to the target area. Proper needle placement secured. Negative aspiration confirmed. Solution injected in intermittent fashion, asking for systemic symptoms every 0.5cc of injectate. The needles were then removed and the area cleansed, making sure to leave some of the prepping solution back to take advantage of its long term bactericidal properties.  Vitals:   05/07/22 1050 05/07/22 1055 05/07/22 1100 05/07/22 1106  BP: 124/86 104/83  130/74  Pulse:      Resp: '13 14 13 14  '$ Temp:    97.7 F (36.5 C)  TempSrc:      SpO2: 94% 94% 95% 99%  Weight:      Height:        Start Time: 1045 hrs. End Time: 1100 hrs.  Imaging Guidance (Spinal):          Type of Imaging Technique: Fluoroscopy Guidance  (Spinal) Indication(s): Assistance in needle guidance and placement for procedures requiring needle placement in or near specific anatomical locations not easily accessible without such assistance. Exposure Time: Please see nurses notes. Contrast: Before injecting any contrast, we confirmed that the patient did not have an allergy to iodine, shellfish, or radiological contrast. Once satisfactory needle placement was completed at the desired level, radiological contrast was injected. Contrast injected under live fluoroscopy. No contrast complications. See chart for type and volume of contrast used. Fluoroscopic Guidance: I was personally present during the use of fluoroscopy. "Tunnel Vision Technique" used to obtain the best possible view of the target area. Parallax error corrected before commencing the procedure. "Direction-depth-direction" technique used to introduce the needle under continuous pulsed fluoroscopy. Once target was reached, antero-posterior, oblique, and lateral fluoroscopic projection used confirm needle placement in all planes. Images permanently stored in EMR. Interpretation: I personally interpreted the imaging intraoperatively. Adequate needle placement confirmed in multiple planes. Appropriate spread of contrast into desired area was observed. No evidence of afferent or efferent intravascular uptake. No intrathecal or subarachnoid spread observed. Permanent images saved into the patient's record.  Post-operative Assessment:  Post-procedure Vital Signs:  Pulse/HCG Rate: (!) 54(!) 58 Temp: 97.7 F (36.5 C) Resp: 14 BP: 130/74 SpO2: 99 %  EBL: None  Complications: No immediate post-treatment complications observed by team, or reported by patient.  Note: The patient tolerated the entire procedure well. A repeat set of vitals were taken after the procedure and the patient was kept under observation following institutional policy, for this type of procedure. Post-procedural  neurological assessment was performed, showing return to baseline, prior to discharge. The patient was provided with post-procedure discharge instructions, including a section on how to identify potential problems. Should any problems arise concerning this procedure, the patient was given instructions to immediately contact  us, at any time, without hesitation. In any case, we plan to contact the patient by telephone for a follow-up status report regarding this interventional procedure.  Comments:  No additional relevant information.  Plan of Care (POC)  Orders:  Orders Placed This Encounter  Procedures   Lumbar Transforaminal Epidural    Scheduling Instructions:     Laterality: Bilateral     Level(s): L2 & L3     Sedation: Patient's choice.     Timeframe: Today    Order Specific Question:   Where will this procedure be performed?    Answer:   [facility]   [facility] C-ARM 1-60 MIN NO REPORT    Intraoperative interpretation by procedural physician at [place].    Standing Status:   Standing    Number of Occurrences:   1    Order Specific Question:   Reason for exam:    Answer:   Assistance in needle guidance and placement for procedures requiring needle placement in or near specific anatomical locations not easily accessible without such assistance.   Informed Consent Details: Physician/Practitioner Attestation; Transcribe to consent form and obtain patient signature    Provider Attestation: I, Guy Dossie Arbour, MD, (Pain Management Specialist), the physician/practitioner, attest that I have discussed with the patient the benefits, risks, side effects, alternatives, likelihood of achieving goals and potential problems during recovery for the procedure that I have provided informed consent.    Scheduling Instructions:     Note: Always confirm laterality of pain with Mr. Tin, before procedure.     Transcribe to consent form and obtain patient signature.     Order Specific Question:   Physician/Practitioner attestation of informed consent for procedure/surgical case    Answer:   I, the physician/practitioner, attest that I have discussed with the patient the benefits, risks, side effects, alternatives, likelihood of achieving goals and potential problems during recovery for the procedure that I have provided informed consent.    Order Specific Question:   Procedure    Answer:   Diagnostic lumbar transforaminal epidural steroid injection under fluoroscopic guidance. (See notes for level and laterality.)    Order Specific Question:   Physician/Practitioner performing the procedure    Answer:   Karess Harner A. Dossie Arbour, MD    Order Specific Question:   Indication/Reason    Answer:   Lumbar radiculopathy/radiculitis associated with lumbar stenosis   Provide equipment / supplies at bedside    Procedure tray: "Block Tray" (Disposable  single use) Skin infiltration needle: Regular 1.5-in, 25-G, (x1) Block Needle type: Spinal Amount/quantity: 2 Size: Medium (5-inch) Gauge: 22G    Standing Status:   Standing    Number of Occurrences:   1    Order Specific Question:   Specify    Answer:   Block Tray   Chronic Opioid Analgesic:  No chronic opioid analgesics therapy prescribed by our practice. None MME/day: 0 mg/day   Medications ordered for procedure: Meds ordered this encounter  Medications   iohexol (OMNIPAQUE) 180 MG/ML injection 10 mL    Must be Myelogram-compatible. If not available, you may substitute with a water-soluble, non-ionic, hypoallergenic, myelogram-compatible radiological contrast medium.   lidocaine (XYLOCAINE) 2 % (with pres) injection 400 mg   pentafluoroprop-tetrafluoroeth (GEBAUERS) aerosol   lactated ringers infusion   midazolam (VERSED) injection 0.5-2 mg    Make sure Flumazenil is available in the pyxis when using this medication. If oversedation occurs, administer 0.2 mg IV over 15 sec. If after 45 sec  no response, administer  0.2 mg again over 1 min; may repeat at 1 min intervals; not to exceed 4 doses (1 mg)   sodium chloride flush (NS) 0.9 % injection 2 mL    This is for a two (2) level block. Use two (2) syringes and divide content in half.   ropivacaine (PF) 2 mg/mL (0.2%) (NAROPIN) injection 2 mL    This is for a two (2) level block. Use two (2) syringes and divide content in half.   dexamethasone (DECADRON) injection 20 mg    This is for a two (2) level block. Use two (2) syringes and divide content in half.   Medications administered: We administered iohexol, lidocaine, pentafluoroprop-tetrafluoroeth, midazolam, sodium chloride flush, ropivacaine (PF) 2 mg/mL (0.2%), and dexamethasone.  See the medical record for exact dosing, route, and time of administration.  Follow-up plan:   Return in about 2 weeks (around 05/21/2022) for Proc-day (T,Th), (Face2F), (PPE).       Interventional Therapies  Risk Factors  Considerations:   Drug hypersensitivity: Oral prednisone (hyperactivity) Movement disorder (Parkinson's dyskinesia)  unsteady gait  tremor  Hx. Seizures   Carotid stenosis  orthostatic hypotension  anxiety/depression  GERD      Levoscoliosis (L3-4 apex)     Planned  Pending:   Diagnostic/therapeutic bilateral L2 and L3 TFESI #1  Diagnostic/therapeutic facet block    Under consideration:   Therapeutic right L1-2 LESI #3  Diagnostic bilateral L2-3 & L3-4 lumbar facet (L1-4) MBB #2  Diagnostic x-rays of the cervical spine for further evaluation of his cervicalgia.   Completed:   Diagnostic bilateral L2-3 & L3-4 Facet Blk (L1-4 MBB) x1 (12/23/2021) (75/75/75 x2 weeks)  Diagnostic bilateral lumbar facet (L2-S1) MBB x1 (07/08/2021) (50/50/50/<50)  Therapeutic right L1-2 LESI x2 (01/27/2022) (1st:100/100/50/50) (2nd: 75/75/50/50)  Diagnostic right L2-3 LESI x1 (06/03/2021) (100/100/45/45)  Referral to physical therapy for LB PT as well as a back brace eval. (04/28/2021) (water aerobics)     Therapeutic  Palliative (PRN) options:   None w/o F2F evaluation by provider.   Pharmacotherapy  Drug hypersensitivity: Oral prednisone (hyperactivity)        Recent Visits Date Type Provider Dept  04/29/22 Office Visit Milinda Pointer, MD [facility]  02/10/22 Office Visit Milinda Pointer, MD [facility]  Showing recent visits within past 90 days and meeting all other requirements Today's Visits Date Type Provider Dept  05/07/22 Procedure visit Milinda Pointer, MD [facility]  Showing today's visits and meeting all other requirements Future Appointments Date Type Provider Dept  05/21/22 Appointment Milinda Pointer, MD [facility]  Showing future appointments within next 90 days and meeting all other requirements  Disposition: Discharge home  Discharge (Date  Time): 05/07/2022; 1115 hrs.   Primary Care Physician: Olin Hauser, DO Location: [facility] Note by: Gaspar Cola, MD (TTS technology used. I apologize for any typographical errors that were not detected and corrected.) Date: 05/07/2022; Time: 12:34 PM  Disclaimer:  Medicine is not an Chief Strategy Officer. The only guarantee in medicine is that nothing is guaranteed. It is important to note that the decision to proceed with this intervention was based on the information collected from the patient. The Data and conclusions were drawn from the patient's questionnaire, the interview, and the physical examination. Because the information was provided in large part by the patient, it cannot be guaranteed that it has not been purposely or unconsciously manipulated. Every effort has been made to obtain  as much relevant data as possible for this evaluation. It is important to note that the conclusions that lead to this procedure are derived in large part from the available data. Always take into account that the treatment will also be  dependent on availability of resources and existing treatment guidelines, considered by other Pain Management Practitioners as being common knowledge and practice, at the time of the intervention. For Medico-Legal purposes, it is also important to point out that variation in procedural techniques and pharmacological choices are the acceptable norm. The indications, contraindications, technique, and results of the above procedure should only be interpreted and judged by a Board-Certified Interventional Pain Specialist with extensive familiarity and expertise in the same exact procedure and technique.

## 2022-05-07 NOTE — Patient Instructions (Signed)

## 2022-05-08 ENCOUNTER — Telehealth: Payer: Self-pay

## 2022-05-08 NOTE — Telephone Encounter (Signed)
Called PP Stated that he had a headache last evening and slight one this am. Instructed give fluids and to make sure he is eating. If any further problems to call us back or could always go to the ED. Wife with understanding.

## 2022-05-12 ENCOUNTER — Ambulatory Visit: Payer: Medicare HMO | Admitting: Physical Therapy

## 2022-05-13 ENCOUNTER — Ambulatory Visit (INDEPENDENT_AMBULATORY_CARE_PROVIDER_SITE_OTHER): Payer: Medicare HMO | Admitting: Psychiatry

## 2022-05-13 ENCOUNTER — Encounter: Payer: Self-pay | Admitting: Psychiatry

## 2022-05-13 DIAGNOSIS — G894 Chronic pain syndrome: Secondary | ICD-10-CM

## 2022-05-13 DIAGNOSIS — F411 Generalized anxiety disorder: Secondary | ICD-10-CM | POA: Diagnosis not present

## 2022-05-13 DIAGNOSIS — F331 Major depressive disorder, recurrent, moderate: Secondary | ICD-10-CM | POA: Diagnosis not present

## 2022-05-13 DIAGNOSIS — R69 Illness, unspecified: Secondary | ICD-10-CM | POA: Diagnosis not present

## 2022-05-13 NOTE — Progress Notes (Signed)
Joseph Arroyo [id number] [birth year] 82 y.o.  Virtual Visit via Telephone Note  I connected with pt by telephone and verified that I am speaking with the correct person using two identifiers.   I discussed the limitations, risks, security and privacy concerns of performing an evaluation and management service by telephone and the availability of in person appointments. I also discussed with the patient that there may be a patient responsible charge related to this service. The patient expressed understanding and agreed to proceed.  I discussed the assessment and treatment plan with the patient. The patient was provided an opportunity to ask questions and all were answered. The patient agreed with the plan and demonstrated an understanding of the instructions.   The patient was advised to call back or seek an in-person evaluation if the symptoms worsen or if the condition fails to improve as anticipated.  I provided 30 minutes of non-face-to-face time during this encounter. The patient was located at home and the provider was located office. Session 1100-1130 am.  Subjective:   Patient ID:  Joseph Arroyo is a 82 y.o. (DOB 1940-07-07) male.  Chief Complaint:  Chief Complaint  Patient presents with   Follow-up   Anxiety   Depression    Depression        Associated symptoms include fatigue.  Past medical history includes anxiety.   Anxiety Patient reports no dizziness or palpitations.     Joseph Arroyo ( lor-on) presents to the office today for follow-up of major depression and generalized anxiety disorder.    He was seen December, 2020 & June 2021.  No meds were changed.  Continued paroxetine 60 mg daily plus gabapentin 100 mg twice daily and Mirapex 0.125 mg 3 times daily.   08/2019 appt without med changes noted: Tolerates the meds well.  Wife thinks he's doing very well and he agrees.   No complaints.  2 D's.   Still exercising and has fatigue but can  function and enjoys activity. Restarted men's Bible study.  He feels positive and hopeful and useful.  At some point would like to be free of medication.  Does not feel guilty about the meds.    03/19/2020 appt noted:  Seen with wife Joseph Arroyo August and took Ivermectin and both had it mild. As far mood concerned he's doing well.  Wife agrees.  Not markedly depressed or anxious.  Feels the suffering of others.  Joseph Arroyo in hospital in [place].  Friend with Covid also. No episodes. Tolerating meds.  No Xanax used.  Attend [employer] in [place]. Usually sleep well but back pain can interfere with sleep. TX for PD and a bit slower per wife. Plan: Try increasing gabapentin to 100 mg AM & 200 mg PM for back pain.   Continue paroxetine 60 mg daily and Deplin 15 mg daily  10/03/2020 appointment with the following noted: Seen with wife, Joseph Arroyo and on increased gabapentin at PM to 200 mg for back pain and sleeps better.   Not depressed.  Anxiety is manageable.   Seeing chiropracter who he likes Dr. Emmit Arroyo in [place]. No SE noted with meds.  Perspire heavily. No panic attacks.   Sleep variable and is usually OK. Still leads men's Bible Study. Uses [employer] Plan: No med change  03/25/2021 appointment with the following noted: with wife Joseph Arroyo but came on quickly. Shaking with anxiety and been going on for 3-4 weeks. Rare use of Xanax. A lot of physical  px with constant pain with back. Pending workup.  Fell 3 weeks ago. T pain clinic now.  Everything seemed to pile up. Insomnia with mind racing with worry.  He can't tolerate negativity.   Rare panic. Consistent with paroxetine 60 mg daily. Consistent with gabapentin 100 AM and 200 PM Plan: For persistent symptoms of anxiety we will start Arroyo label trial of clonidine.  Cautioned about side effects.  05/13/2021 appointment with the following noted: wife Joseph Arroyo on call Taking clonidine 0.1 mg tablet 1/2 tablets twice  daily. Can't tell if there has been benefit from this.   Still taking Xanax but usually about one daily. Couldn't get here DT pain.  Been to pain clinic but no meds yet.  Problems with insurance company giving approval for the tests.  At times pain is excruciating.   BP has been OK.   Being cautious about getting up and protecting from dizziness. Tremors will wax and wane and worse with stress.   Fighting over the idea God does not want him to be like this and feels there is something wrong with him.   No SI  "that's not an option".   Plan: Continue paroxetine 60 mg daily DT PD hesitate to use atypicals for TR anxiety. Consider increasing gabapentin for anxiety Arroyo label.  Yes increase to 200 mg BID Ok Xanax  Clonidine 0.1 mg tablets 1/2 tablet twice daily    07/03/2021 appointment with the following noted: seen with Joseph Arroyo on call Using Xanax prn 1-2 daily. Increased gabapentin 100 mg AM and noon and 200 mg PM; hard to tell if it helped any but it might have.  No SE noted. Feels fine today and thinks there's improvement.  Believe the Joseph Arroyo is bringing him through this. Epidural recently.  Some pain relief. Per wife has had some anxious moments.   Tremors vary.   Some PT, balance a little worse since Xmas.   No low BP. When sleep is fine but 2-3 hours at a time and nocturia. He thinks depression is a lot better, today I feel good.  Needs to get out socially.  He's a people person.  She thinks today is best day since Jan.   Plan: Consider increasing gabapentin for anxiety Arroyo label.  Yes increase to 200 mg BID  09/09/2021 appointment with following noted: with wife Joseph Arroyo Doing ok thank you. A lot better with depression and anxiety. Gabapentin 100 mg AM and noon and 200 HS. Gets sleepy in Am after breakfast with Sinemet.  Takes a nap. Sleep pretty well. Disc scamming but they have been careful. Wife notices anxiety and tremors reenforce each other.  Only one Xanax daily.  Wants to go out  daily. Not attending main church but can go to house church and is getting out more. No SE except sleepiness. 3 back pain procedures with some benefit. Started speech therapy and OT and he likes to be active. Plan: no med changes Continur paroxetine 60 mg daily gabapentin 100 mg capsules 1 every morning and noon and 200 mg nightly,  01/12/22 appt noted:  ([place]) Continues alprazolam 0.25 mg 3 times daily as needed anxiety (taking 1 daily), clonidine 0.1 mg tablets one half twice daily, gabapentin 100 mg every morning and noon and 200 mg nightly, L-methylfolate, paroxetine 20 mg every morning and 40 mg nightly, pramipexole 0.125 mg nightly. Increased carbi-levo to 2 and 1/2 tab TID.   Vivid dreams.  Last night a disturbing one.  A couple of times in  past remotely acted out the dream.   Feeling tired with the meds.  Pain meds also make him tired and PD too.  Continues to believe God will heal him completely.  Getting facet blocks at the pain doc.  Continues shots. Depression is minimal but more anxiety than depression.  Will sometimes Xanax for tremor.  Not alsways used daily.  Finished speech therapy but still doing PT.  Is better when he goes out somewhere.  Family coming soon,  Sleep is otherwise OK.   Tendency to constipation or diarrhea.   Asks about natural meds to help his medical problems if possible.  He'd like some changes .   Church built him a ramp for his house. Plan: No med changes   05/13/2022 appointment noted:  with Joseph Arroyo, reviewed med list with her Not taking pramipexole hs but is taking gabapentin. More difficulty speaking than he has been in the past.   Holding my own.  Struggling quite a bit.  More anxiety provoked by tremor or bad dreams.  Latter caused bad dreams.  Still some.  Not acting out dreams like before.  Bad dreams about 2 times per week.   Usually sleep 2 hours at time with nocturia.   PD sx are worse per wife.  Harder to walk and do things.   No VH with meds  for PD. Anxiety remains pretty high and needs the Xanax.   ? Tiredness with meds.    Was 12 years free of medication until this last relapse and yet it took a long time to get relief again.  Wife, Joseph Arroyo, said overall he's been doing well.  No prn BZ in a couple of years.  Past Psychiatric Medication Trials: Under our care since 2013. Paroxetine 60, duloxetine, fluoxetine, Luvox, venlafaxine, Trintellix, ,  mirtazapine,doxepin,  Gabapentin 200 BID Deplin,  clonazepam, lorazepam, alprazolam  lamotrigine,  buspirone,   Clonidine 0.05 BID Abilify, Seroquel, Zyprexa,  risperidone, Saphris,  lithium with some benefit,  Pramipexole   Review of Systems:  Review of Systems  Constitutional:  Positive for fatigue.  HENT:  Positive for voice change.   Cardiovascular:  Negative for palpitations.  Gastrointestinal:  Positive for diarrhea.  Musculoskeletal:  Positive for back pain and gait problem.  Neurological:  Positive for tremors and weakness. Negative for dizziness.  Psychiatric/Behavioral:  Negative for dysphoric mood.     Medications: I have reviewed the patient's current medications.  Current Outpatient Medications  Medication Sig Dispense Refill   acetaminophen (TYLENOL) 500 MG tablet Take 1,000 mg by mouth every 8 (eight) hours as needed.     ALPRAZolam (XANAX) 0.25 MG tablet Take 1 tablet (0.25 mg total) by mouth 3 (three) times daily as needed for anxiety. (Patient taking differently: Take 0.25 mg by mouth 3 (three) times daily as needed for anxiety. 1 daily) 90 tablet 1   B Complex Vitamins (VITAMIN B COMPLEX PO) Take 1 tablet by mouth daily.     baclofen (LIORESAL) 10 MG tablet Take 0.5-1 tablets (5-10 mg total) by mouth 3 (three) times daily as needed for muscle spasms. 90 each 1   Capsicum-Garlic [id number] MG CAPS Take 200-300 mg by mouth.     carbidopa-levodopa (SINEMET IR) 25-100 MG tablet Take 2.5 tablets by mouth 3 (three) times daily.     cloNIDine (CATAPRES) 0.1 MG tablet  TAKE 1/2 TABLET BY MOUTH TWICE DAILY 90 tablet 1   gabapentin (NEURONTIN) 100 MG capsule 1 in AM and noon and 2 at night 360  capsule 1   HAWTHORNE BERRY PO Take by mouth daily.     L-Methylfolate 15 MG TABS Take 1 tablet (15 mg total) by mouth daily. 90 tablet 3   Lutein 10 MG TABS Take 1 tablet by mouth 3 (three) times daily. 1 daily     PARoxetine (PAXIL) 20 MG tablet TAKE 1 TABLET BY MOUTH EVERY DAY (WITH THE 40 MG TABLET FOR 60 MG TOTAL) 90 tablet 1   PARoxetine (PAXIL) 40 MG tablet TAKE 1 TABLET BY MOUTH EVERY DAY (WITH THE 20 MG TABLET FOR 60 MG TOTAL) 90 tablet 1   Saw Palmetto, Serenoa repens, 1000 MG CAPS Take 2 capsules by mouth daily.     PSYLLIUM HUSK PO Take by mouth. (Patient not taking: Reported on 05/13/2022)     No current facility-administered medications for this visit.    Medication Side Effects: Fatigue and Other: from Mirapex  esp in AM  Allergies:  Allergies  Allergen Reactions   Prednisone     Hyperactivity    Wheat Diarrhea    Past Medical History:  Diagnosis Date   Allergic rhinitis due to pollen 11/21/2007   Brachial neuritis or radiculitis NOS    Cervicalgia    Concussion    age 81 - s/p accident   Costal chondritis    Depression    Essential and other specified forms of tremor    GERD (gastroesophageal reflux disease)    in past   Lumbago    Occlusion and stenosis of carotid artery without mention of cerebral infarction    Seizures ([place])    age 58 - after concussion    Family History  Problem Relation Age of Onset   Heart failure Mother        enlarged heart    Asthma Father     Social History   Socioeconomic History   Marital status: Married    Spouse name: Joseph Arroyo Tetro   Number of children: 2   Years of education: 12   Highest education level: Master's degree (e.g., MA, MS, MEng, MEd, MSW, MBA)  Occupational History   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[employer])    Employer: RETIRED  Tobacco Use   Smoking status: Never    Smokeless tobacco: Never  Vaping Use   Vaping Use: Never used  Substance and Sexual Activity   Alcohol use: No    Alcohol/week: 0.0 standard drinks of alcohol   Drug use: No   Sexual activity: Not Currently  Other Topics Concern   Not on file  Social History Narrative   Married to [place], has 2 children, has grandchildren   Right handed   Master's plus   He is born in [place], heritage is [place] (Father's side), [place] (Mother's side)      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    Social Determinants of Health   Financial Resource Strain: Low Risk  (10/13/2021)   Overall Financial Resource Strain (CARDIA)    Difficulty of Paying Living Expenses: Not hard at all  Food Insecurity: No Food Insecurity (10/13/2021)   Hunger Vital Sign    Worried About Running Out of Food in the Last Year: Never true    Ran Out of Food in the Last Year: Never true  Transportation Needs: No Transportation Needs (10/13/2021)   PRAPARE - Hydrologist (Medical): No    Lack of Transportation (Non-Medical): No  Physical Activity: Insufficiently Active (10/13/2021)   Exercise Vital Sign    Days of Exercise  per Week: 3 days    Minutes of Exercise per Session: 30 min  Stress: No Stress Concern Present (10/13/2021)   [place]    Feeling of Stress : Not at all  Social Connections: [place] (10/13/2021)   Social Connection and Isolation Panel [NHANES]    Frequency of Communication with Friends and Family: More than three times a week    Frequency of Social Gatherings with Friends and Family: Never    Attends Religious Services: More than 4 times per year    Active Member of [employer] or Organizations: Yes    Attends Archivist Meetings: 1 to 4 times per year    Marital Status: Married  Human resources officer Violence: Not At Risk (08/24/2017)   Humiliation, Afraid, Rape, and Kick questionnaire    Fear of  Current or Ex-Partner: No    Emotionally Abused: No    Physically Abused: No    Sexually Abused: No    Past Medical History, Surgical history, Social history, and Family history were reviewed and updated as appropriate.   Please see review of systems for further details on the patient's review from today.   Objective:   Physical Exam:  There were no vitals taken for this visit.  Physical Exam Neurological:     Mental Status: He is alert and oriented to person, place, and time.     Cranial Nerves: No dysarthria.  Psychiatric:        Attention and Perception: Attention and perception normal.        Mood and Affect: Mood is anxious and depressed.        Behavior: Behavior is cooperative.        Thought Content: Thought content normal. Thought content is not paranoid or delusional. Thought content does not include homicidal or suicidal ideation. Thought content does not include suicidal plan.        Cognition and Memory: Cognition and memory normal.        Judgment: Judgment normal.     Comments: Insight intact Voice change with PD     Lab Review:     Component Value Date/Time   NA 133 (L) 10/10/2021 0843   NA 143 07/10/2015 0821   K 4.2 10/10/2021 0843   CL 97 (L) 10/10/2021 0843   CO2 31 10/10/2021 0843   GLUCOSE 85 10/10/2021 0843   BUN 20 10/10/2021 0843   BUN 13 07/10/2015 0821   CREATININE 0.77 10/10/2021 0843   CALCIUM 9.0 10/10/2021 0843   PROT 6.3 10/10/2021 0843   PROT 6.8 07/10/2015 0821   ALBUMIN 4.1 03/24/2021 1351   ALBUMIN 4.2 07/10/2015 0821   AST 16 10/10/2021 0843   ALT 6 (L) 10/10/2021 0843   ALKPHOS 51 03/24/2021 1351   BILITOT 0.7 10/10/2021 0843   BILITOT 0.5 07/10/2015 0821   GFRNONAA >60 03/24/2021 1351   GFRNONAA 86 09/07/2019 0941   GFRAA 100 09/07/2019 0941       Component Value Date/Time   WBC 5.6 10/10/2021 0843   RBC 4.42 10/10/2021 0843   HGB 14.5 10/10/2021 0843   HGB 15.3 10/23/2014 1237   HCT 42.3 10/10/2021 0843   HCT  45.1 10/23/2014 1237   PLT 193 10/10/2021 0843   PLT 150 10/23/2014 1237   MCV 95.7 10/10/2021 0843   MCV 88 10/23/2014 1237   MCH 32.8 10/10/2021 0843   MCHC 34.3 10/10/2021 0843   RDW 11.7 10/10/2021 0843   RDW  13.3 10/23/2014 1237   LYMPHSABS 1,210 10/10/2021 0843   LYMPHSABS 1.7 10/23/2014 1237   MONOABS 357 07/24/2016 1050   EOSABS 258 10/10/2021 0843   EOSABS 0.3 10/23/2014 1237   BASOSABS 62 10/10/2021 0843   BASOSABS 0.1 10/23/2014 1237    No results found for: "POCLITH", "LITHIUM"   No results found for: "PHENYTOIN", "PHENOBARB", "VALPROATE", "CBMZ"   .res Assessment: Plan:    Dimitris was seen today for follow-up, anxiety and depression.  Diagnoses and all orders for this visit:  Major depressive disorder, recurrent episode, moderate (HCC)  GAD (generalized anxiety disorder)  Chronic pain syndrome    History of extremely severe TR depression and anxiety with multiple med failures.  Doing well for 2-3 years.    Unfortunately he has had a severe relapse of anxiety over the last several weeks.  It is contributed to by pain and advancing Parkinson's disease.  He has been consistent with the paroxetine  Still works out at gym.  Disc pricing L-methylfolate with [facility]. Continue paroxetine 60 mg daily Hesitate to increase paroxetine right now   Parkiinson's is better with meds. But not gread. DT PD hesitate to use atypicals for TR anxiety.  Consider nortriptyline but this is more risky DT age.    Continue gabapentin for anxiety Arroyo label and pain 200 mg BID  Ok Xanax 0.25 mg TID prn.  He's not using much We discussed the short-term risks associated with benzodiazepines including sedation and increased fall risk among others.  Discussed long-term side effect risk including dependence, potential withdrawal symptoms, and the potential eventual dose-related risk of dementia.  But recent studies from 2020 dispute this association between benzodiazepines and dementia  risk. Newer studies in 2020 do not support an association with dementia.  Trial wean  and if anxiety is worse then resume Clonidine 0.1 mg tablets 1/2 tablet twice daily   Disc SE incl fall risk.  Push fluids.  Fall precautions.  This is Arroyo label but often provides fairly rapid relief of anxiety.  He has alprazolam but it does not provide adequate control.  He's always had low BP.    No med changes today.  Tolerating and benefitting.' Disc fall risks.  Answered questions about Vitamin D.  He's taking 4000 units  FU 2-3 mos  Lynder Parents, MD, DFAPA   Please see After Visit Summary for patient specific instructions.  Future Appointments  Date Time Provider [place]  05/21/2022  2:20 PM Milinda Pointer, MD [facility] None    No orders of the defined types were placed in this encounter.    -------------------------------

## 2022-05-14 ENCOUNTER — Ambulatory Visit: Payer: Medicare HMO | Admitting: Physical Therapy

## 2022-05-19 ENCOUNTER — Other Ambulatory Visit: Payer: Self-pay | Admitting: Psychiatry

## 2022-05-19 ENCOUNTER — Ambulatory Visit: Payer: Medicare HMO | Admitting: Physical Therapy

## 2022-05-19 ENCOUNTER — Telehealth: Payer: Self-pay | Admitting: Psychiatry

## 2022-05-19 DIAGNOSIS — F411 Generalized anxiety disorder: Secondary | ICD-10-CM

## 2022-05-19 MED ORDER — ALPRAZOLAM 0.25 MG PO TABS: 0.2500 mg | ORAL_TABLET | Freq: Three times a day (TID) | ORAL | 1 refills | Status: DC | PRN

## 2022-05-19 NOTE — Telephone Encounter (Signed)
Pt asked for the generic of xanax to be [facility] [place]

## 2022-05-19 NOTE — Progress Notes (Unsigned)
PROVIDER NOTE: Information contained herein reflects review and annotations entered in association with encounter. Interpretation of such information and data should be left to medically-trained personnel. Information provided to patient can be located elsewhere in the medical record under "Patient Instructions". Document created using STT-dictation technology, any transcriptional errors that may result from process are unintentional.    Patient: Joseph Arroyo  Service Category: E/M  Provider: Gaspar Cola, MD  DOB: 12-04-1940  DOS: 05/21/2022  Referring Provider: Nobie Putnam *  MRN: [id number]  Specialty: Interventional Pain Management  PCP: Olin Hauser, DO  Type: Established Patient  Setting: Ambulatory outpatient    Location: Office  Delivery: Face-to-face     HPI  [name], a 82 y.o. year old male, is here today because of his No primary diagnosis found.. Joseph Arroyo's primary complain today is No chief complaint on file.  Pertinent problems: Joseph Arroyo has Chronic knee pain (Left); Derangement of medial meniscus, posterior horn (Left); PAD (peripheral artery disease) ([place]); Chronic pain syndrome; Grade 1 Retrolisthesis of L2/L3 (5 mm) and L3/L4 (3 mm); Levoscoliosis of lumbar spine (L3-4 apex); DDD (degenerative disc disease), lumbosacral; Lumbosacral facet arthropathy (Left: L3-4, L4-5, and L5-S1); Tricompartment osteoarthritis of knee (Left); Baker cyst (Left); Chronic low back pain (1ry area of Pain) (Bilateral) (R>L) w/o sciatica; Lumbar facet syndrome (Bilateral); Chronic lower extremity pain (2ry area of Pain) (Bilateral) (L>R); Lumbosacral radiculitis/sensory radiculopathy at L2 (Bilateral); Lumbosacral radiculitis/sensory radiculopathy at L3 (Bilateral); Abnormal MRI, lumbar spine (04/23/2021); Lumbar central spinal stenosis, w/o neurogenic claudication (L4-5); Lumbar lateral recess stenosis (Bilateral: L2-3, L4-5) (Right: L3-4);  Lumbosacral foraminal stenosis (Bilateral: L2-3, L3-4) (Left: L5-S1); Lumbar nerve root impingement (Right: L3 at L2-3 & L3-4); Ligamentum flavum hypertrophy (L3-4, L4-5); Spondylosis without myelopathy or radiculopathy, lumbosacral region; Cervicalgia; and Parkinson's disease with dyskinesia on their pertinent problem list. Pain Assessment: Severity of   is reported as a  /10. Location:    / . Onset:  . Quality:  . Timing:  . Modifying factor(s):  Marland Kitchen Vitals:  vitals were not taken for this visit.  BMI: Estimated body mass index is 19.58 kg/m as calculated from the following:   Height as of 05/07/22: 5\' 7"  (1.702 m).   Weight as of 05/07/22: 125 lb (56.7 kg). Last encounter: 04/29/2022. Last procedure: 05/07/2022.  Reason for encounter: post-procedure evaluation and assessment. ***  Post-procedure evaluation   Procedure: Lumbar trans-foraminal epidural steroid injection (L-TFESI) #1  Laterality: Bilateral (-50)  Level: L2 & L3 nerve root(s) Imaging: Fluoroscopy-guided         Anesthesia: Local anesthesia (1-2% Lidocaine) Anxiolysis: IV Versed 1.5 mg Sedation: No Sedation                       DOS: 05/07/2022  Performed by: Gaspar Cola, MD  Purpose: Diagnostic/Therapeutic Indications: Lumbar radicular pain severe enough to impact quality of life or function. 1. Lumbosacral radiculitis/sensory radiculopathy at L2 (Bilateral)   2. Lumbosacral radiculitis/sensory radiculopathy at L3 (Bilateral)   3. Lumbar nerve root impingement (Right: L3 at L2-3 & L3-4)   4. Grade 1 Retrolisthesis of L2/L3 (5 mm) and L3/L4 (3 mm)   5. Lumbosacral foraminal stenosis (Bilateral: L2-3, L3-4) (Left: L5-S1)   6. Lumbar lateral recess stenosis (Bilateral: L2-3, L4-5) (Right: L3-4)   7. Levoscoliosis of lumbar spine (L3-4 apex)   8. DDD (degenerative disc disease), lumbosacral   9. Chronic low back pain (1ry area of Pain) (Bilateral) (R>L) w/o sciatica  10. Chronic lower extremity pain (2ry area of Pain)  (Bilateral) (L>R)   11. Parkinson's disease with dyskinesia, unspecified whether manifestations fluctuate    NAS-11 Pain score:   Pre-procedure: 8 /10   Post-procedure: 0-No pain/10      Effectiveness:  Initial hour after procedure:   ***. Subsequent 4-6 hours post-procedure:   ***. Analgesia past initial 6 hours:   ***. Ongoing improvement:  Analgesic:  *** Function:    ***    ROM:    ***     Pharmacotherapy Assessment  Analgesic: No chronic opioid analgesics therapy prescribed by our practice. None MME/day: 0 mg/day   Monitoring: [place] PMP: PDMP reviewed during this encounter.       Pharmacotherapy: No side-effects or adverse reactions reported. Compliance: No problems identified. Effectiveness: Clinically acceptable.  No notes on file  No results found for: "CBDTHCR" No results found for: "D8THCCBX" No results found for: "D9THCCBX"  UDS:  Summary  Date Value Ref Range Status  03/25/2021 Note  Final    Comment:    ==================================================================== Compliance Drug Analysis, Ur ==================================================================== Test                             Result       Flag       Units  Drug Present and Declared for Prescription Verification   Gabapentin                     PRESENT      EXPECTED   Baclofen                       PRESENT      EXPECTED   Paroxetine                     PRESENT      EXPECTED   Ibuprofen                      PRESENT      EXPECTED  Drug Present not Declared for Prescription Verification   Acetaminophen                  PRESENT      UNEXPECTED  Drug Absent but Declared for Prescription Verification   Alprazolam                     Not Detected UNEXPECTED ng/mg creat ==================================================================== Test                      Result    Flag   Units      Ref Range   Creatinine              122              mg/dL       >=20 ==================================================================== Declared Medications:  The flagging and interpretation on this report are based on the  following declared medications.  Unexpected results may arise from  inaccuracies in the declared medications.   **Note: The testing scope of this panel includes these medications:   Alprazolam  Baclofen (Lioresal)  Gabapentin (Neurontin)  Paroxetine (Paxil)   **Note: The testing scope of this panel does not include small to  moderate amounts of these reported medications:   Ibuprofen (Advil)   **Note: The testing scope of this panel does not  include the  following reported medications:   Betamethasone (Lotrisone)  Carbidopa (Sinemet)  Clotrimazole (Lotrisone)  Folic Acid  Levodopa (Sinemet)  Lutein  Magnesium  Omega-3 Fatty Acids  Pramipexole (Mirapex)  Supplement  Ubiquinone (CoQ10)  Vitamin B ==================================================================== For clinical consultation, please call [phone]. ====================================================================       ROS  Constitutional: Denies any fever or chills Gastrointestinal: No reported hemesis, hematochezia, vomiting, or acute GI distress Musculoskeletal: Denies any acute onset joint swelling, redness, loss of ROM, or weakness Neurological: No reported episodes of acute onset apraxia, aphasia, dysarthria, agnosia, amnesia, paralysis, loss of coordination, or loss of consciousness  Medication Review  ALPRAZolam, B Complex Vitamins, Capsicum-Garlic, [place], L-Methylfolate, Lutein, PARoxetine, Psyllium, Saw Palmetto (Serenoa repens), acetaminophen, baclofen, carbidopa-levodopa, cloNIDine, and gabapentin  History Review  Allergy: Joseph Arroyo is allergic to prednisone and wheat. Drug: Joseph Arroyo  reports no history of drug use. Alcohol:  reports no history of alcohol use. Tobacco:  reports that he has never smoked. He has  never used smokeless tobacco. Social: Joseph Arroyo  reports that he has never smoked. He has never used smokeless tobacco. He reports that he does not drink alcohol and does not use drugs. Medical:  has a past medical history of Allergic rhinitis due to pollen (11/21/2007), Brachial neuritis or radiculitis NOS, Cervicalgia, Concussion, Costal chondritis, Depression, Essential and other specified forms of tremor, GERD (gastroesophageal reflux disease), Lumbago, Occlusion and stenosis of carotid artery without mention of cerebral infarction, and Seizures ([place]). Surgical: Joseph Arroyo  has a past surgical history that includes Other surgical history (2002); Colonoscopy (2008); Stapedes surgery (Right, 2002); Cataract extraction (Right, 07/18/14); and Cataract extraction w/PHACO (Left, 08/01/2014). Family: family history includes Asthma in his father; Heart failure in his mother.  Laboratory Chemistry Profile   Renal Lab Results  Component Value Date   BUN 20 10/10/2021   CREATININE 0.77 10/10/2021   BCR SEE NOTE: 10/10/2021   GFRAA 100 09/07/2019   GFRNONAA >60 03/24/2021    Hepatic Lab Results  Component Value Date   AST 16 10/10/2021   ALT 6 (L) 10/10/2021   ALBUMIN 4.1 03/24/2021   ALKPHOS 51 03/24/2021    Electrolytes Lab Results  Component Value Date   NA 133 (L) 10/10/2021   K 4.2 10/10/2021   CL 97 (L) 10/10/2021   CALCIUM 9.0 10/10/2021   MG 2.0 03/24/2021    Bone Lab Results  Component Value Date   VD25OH 42 10/10/2021   25OHVITD1 32 03/24/2021   25OHVITD2 <1.0 03/24/2021   25OHVITD3 31 03/24/2021    Inflammation (CRP: Acute Phase) (ESR: Chronic Phase) Lab Results  Component Value Date   CRP 0.5 03/24/2021   ESRSEDRATE 4 03/24/2021         Note: Above Lab results reviewed.  Recent Imaging Review  DG PAIN CLINIC C-ARM 1-60 MIN NO REPORT Fluoro was used, but no Radiologist interpretation will be provided.  Please refer to "NOTES" tab for provider progress  note. Note: Reviewed        Physical Exam  General appearance: Well nourished, well developed, and well hydrated. In no apparent acute distress Mental status: Alert, oriented x 3 (person, place, & time)       Respiratory: No evidence of acute respiratory distress Eyes: PERLA Vitals: There were no vitals taken for this visit. BMI: Estimated body mass index is 19.58 kg/m as calculated from the following:   Height as of 05/07/22: 5\' 7"  (1.702 m).   Weight as of 05/07/22: 125  lb (56.7 kg). Ideal: Ideal body weight: 66.1 kg (145 lb 11.6 oz)  Assessment   Diagnosis Status  No diagnosis found. Controlled Controlled Controlled   Updated Problems: No problems updated.  Plan of Care  Problem-specific:  No problem-specific Assessment & Plan notes found for this encounter.  Joseph Arroyo has a current medication list which includes the following long-term medication(s): clonidine, gabapentin, paroxetine, and paroxetine.  Pharmacotherapy (Medications Ordered): No orders of the defined types were placed in this encounter.  Orders:  No orders of the defined types were placed in this encounter.  Follow-up plan:   No follow-ups on file.      Interventional Therapies  Risk Factors  Considerations:   Drug hypersensitivity: Oral prednisone (hyperactivity) Movement disorder (Parkinson's dyskinesia)  unsteady gait  tremor  Hx. Seizures   Carotid stenosis  orthostatic hypotension  anxiety/depression  GERD      Levoscoliosis (L3-4 apex)     Planned  Pending:   Diagnostic/therapeutic bilateral L2 and L3 TFESI #1  Diagnostic/therapeutic facet block    Under consideration:   Therapeutic right L1-2 LESI #3  Diagnostic bilateral L2-3 & L3-4 lumbar facet (L1-4) MBB #2  Diagnostic x-rays of the cervical spine for further evaluation of his cervicalgia.   Completed:   Diagnostic bilateral L2-3 & L3-4 Facet Blk (L1-4 MBB) x1 (12/23/2021) (75/75/75 x2 weeks)  Diagnostic  bilateral lumbar facet (L2-S1) MBB x1 (07/08/2021) (50/50/50/<50)  Therapeutic right L1-2 LESI x2 (01/27/2022) (1st:100/100/50/50) (2nd: 75/75/50/50)  Diagnostic right L2-3 LESI x1 (06/03/2021) (100/100/45/45)  Referral to physical therapy for LB PT as well as a back brace eval. (04/28/2021) (water aerobics)    Therapeutic  Palliative (PRN) options:   None w/o F2F evaluation by provider.   Pharmacotherapy  Drug hypersensitivity: Oral prednisone (hyperactivity)         Recent Visits Date Type Provider Dept  05/07/22 Procedure visit Milinda Pointer, MD [facility]  04/29/22 Office Visit Milinda Pointer, MD [facility]  Showing recent visits within past 90 days and meeting all other requirements Future Appointments Date Type Provider Dept  05/21/22 Appointment Milinda Pointer, MD [facility]  Showing future appointments within next 90 days and meeting all other requirements  I discussed the assessment and treatment plan with the patient. The patient was provided an opportunity to ask questions and all were answered. The patient agreed with the plan and demonstrated an understanding of the instructions.  Patient advised to call back or seek an in-person evaluation if the symptoms or condition worsens.  Duration of encounter: *** minutes.  Total time on encounter, as per AMA guidelines included both the face-to-face and non-face-to-face time personally spent by the physician and/or other qualified health care professional(s) on the day of the encounter (includes time in activities that require the physician or other qualified health care professional and does not include time in activities normally performed by clinical staff). Physician's time may include the following activities when performed: Preparing to see the patient (e.g., pre-charting review of records, searching for previously ordered imaging, lab work, and nerve conduction tests) Review of  prior analgesic pharmacotherapies. Reviewing PMP Interpreting ordered tests (e.g., lab work, imaging, nerve conduction tests) Performing post-procedure evaluations, including interpretation of diagnostic procedures Obtaining and/or reviewing separately obtained history Performing a medically appropriate examination and/or evaluation Counseling and educating the patient/family/caregiver Ordering medications, tests, or procedures Referring and communicating with other health care professionals (when not separately reported) Documenting clinical information in the electronic or other health  record Independently interpreting results (not separately reported) and communicating results to the patient/ family/caregiver Care coordination (not separately reported)  Note by: Gaspar Cola, MD Date: 05/21/2022; Time: 3:20 PM

## 2022-05-21 ENCOUNTER — Ambulatory Visit: Payer: Medicare HMO | Admitting: Physical Therapy

## 2022-05-21 ENCOUNTER — Encounter: Payer: Self-pay | Admitting: Pain Medicine

## 2022-05-21 ENCOUNTER — Ambulatory Visit: Payer: Medicare HMO | Attending: Pain Medicine | Admitting: Pain Medicine

## 2022-05-21 VITALS — BP 73/54 | HR 55 | Temp 97.3°F | Ht 66.0 in | Wt 125.0 lb

## 2022-05-21 DIAGNOSIS — M47816 Spondylosis without myelopathy or radiculopathy, lumbar region: Secondary | ICD-10-CM

## 2022-05-21 DIAGNOSIS — M4310 Spondylolisthesis, site unspecified: Secondary | ICD-10-CM | POA: Diagnosis not present

## 2022-05-21 DIAGNOSIS — G8929 Other chronic pain: Secondary | ICD-10-CM

## 2022-05-21 DIAGNOSIS — M4316 Spondylolisthesis, lumbar region: Secondary | ICD-10-CM | POA: Diagnosis not present

## 2022-05-21 DIAGNOSIS — R937 Abnormal findings on diagnostic imaging of other parts of musculoskeletal system: Secondary | ICD-10-CM

## 2022-05-21 DIAGNOSIS — M5137 Other intervertebral disc degeneration, lumbosacral region: Secondary | ICD-10-CM | POA: Diagnosis not present

## 2022-05-21 DIAGNOSIS — M51379 Other intervertebral disc degeneration, lumbosacral region without mention of lumbar back pain or lower extremity pain: Secondary | ICD-10-CM

## 2022-05-21 DIAGNOSIS — M545 Low back pain, unspecified: Secondary | ICD-10-CM | POA: Insufficient documentation

## 2022-05-21 NOTE — Patient Instructions (Signed)
______________________________________________________________________  Procedure instructions  Do not eat or drink fluids (other than water) for 6 hours before your procedure  No water for 2 hours before your procedure  Take your blood pressure medicine with a sip of water  Arrive 30 minutes before your appointment  Carefully read the "Preparing for your procedure" detailed instructions  If you have questions call us at [phone]  _____________________________________________________________________    ______________________________________________________________________  Preparing for your procedure  Appointments: If you think you may not be able to keep your appointment, call 24-48 hours in advance to cancel. We need time to make it available to others.  During your procedure appointment there will be: No Prescription Refills. No disability issues to discussed. No medication changes or discussions.  Instructions: Food intake: Avoid eating anything solid for at least 8 hours prior to your procedure. Clear liquid intake: You may take clear liquids such as water up to 2 hours prior to your procedure. (No carbonated drinks. No soda.) Transportation: Unless otherwise stated by your physician, bring a driver. Morning Medicines: Except for blood thinners, take all of your other morning medications with a sip of water. Make sure to take your heart and blood pressure medicines. If your blood pressure's lower number is above 100, the case will be rescheduled. Blood thinners: Make sure to stop your blood thinners as instructed.  If you take a blood thinner, but were not instructed to stop it, call our office [phone] and ask to talk to a nurse. Not stopping a blood thinner prior to certain procedures could lead to serious complications. Diabetics on insulin: Notify the staff so that you can be scheduled 1st case in the morning. If your diabetes requires high dose insulin,  take only  of your normal insulin dose the morning of the procedure and notify the staff that you have done so. Preventing infections: Shower with an antibacterial soap the morning of your procedure.  Build-up your immune system: Take 1000 mg of Vitamin C with every meal (3 times a day) the day prior to your procedure. Antibiotics: Inform the nursing staff if you are taking any antibiotics or if you have any conditions that may require antibiotics prior to procedures. (Example: recent joint implants)   Pregnancy: If you are pregnant make sure to notify the nursing staff. Not doing so may result in injury to the fetus, including death.  Sickness: If you have a cold, fever, or any active infections, call and cancel or reschedule your procedure. Receiving steroids while having an infection may result in complications. Arrival: You must be in the facility at least 30 minutes prior to your scheduled procedure. Tardiness: Your scheduled time is also the cutoff time. If you do not arrive at least 15 minutes prior to your procedure, you will be rescheduled.  Children: Do not bring any children with you. Make arrangements to keep them home. Dress appropriately: There is always a possibility that your clothing may get soiled. Avoid long dresses. Valuables: Do not bring any jewelry or valuables.  Reasons to call and reschedule or cancel your procedure: (Following these recommendations will minimize the risk of a serious complication.) Surgeries: Avoid having procedures within 2 weeks of any surgery. (Avoid for 2 weeks before or after any surgery). Flu Shots: Avoid having procedures within 2 weeks of a flu shots or . (Avoid for 2 weeks before or after immunizations). Barium: Avoid having a procedure within 7-10 days after having had a radiological study involving the use of  radiological contrast. (Myelograms, Barium swallow or enema study). Heart attacks: Avoid any elective procedures or surgeries for the  initial 6 months after a "Myocardial Infarction" (Heart Attack). Blood thinners: It is imperative that you stop these medications before procedures. Let us know if you if you take any blood thinner.  Infection: Avoid procedures during or within two weeks of an infection (including chest colds or gastrointestinal problems). Symptoms associated with infections include: Localized redness, fever, chills, night sweats or profuse sweating, burning sensation when voiding, cough, congestion, stuffiness, runny nose, sore throat, diarrhea, nausea, vomiting, cold or Flu symptoms, recent or current infections. It is specially important if the infection is over the area that we intend to treat. Heart and lung problems: Symptoms that may suggest an active cardiopulmonary problem include: cough, chest pain, breathing difficulties or shortness of breath, dizziness, ankle swelling, uncontrolled high or unusually low blood pressure, and/or palpitations. If you are experiencing any of these symptoms, cancel your procedure and contact your primary care physician for an evaluation.  Remember:  Regular Business hours are:  Monday to Thursday 8:00 AM to 4:00 PM  Provider's Schedule: Milinda Pointer, MD:  Procedure days: Tuesday and Thursday 7:30 AM to 4:00 PM  Gillis Santa, MD:  Procedure days: Monday and Wednesday 7:30 AM to 4:00 PM  ______________________________________________________________________    ____________________________________________________________________________________________  General Risks and Possible Complications  Patient Responsibilities: It is important that you read this as it is part of your informed consent. It is our duty to inform you of the risks and possible complications associated with treatments offered to you. It is your responsibility as a patient to read this and to ask questions about anything that is not clear or that you believe was not covered in this  document.  Patient's Rights: You have the right to refuse treatment. You also have the right to change your mind, even after initially having agreed to have the treatment done. However, under this last option, if you wait until the last second to change your mind, you may be charged for the materials used up to that point.  Introduction: Medicine is not an Chief Strategy Officer. Everything in Medicine, including the lack of treatment(s), carries the potential for danger, harm, or loss (which is by definition: Risk). In Medicine, a complication is a secondary problem, condition, or disease that can aggravate an already existing one. All treatments carry the risk of possible complications. The fact that a side effects or complications occurs, does not imply that the treatment was conducted incorrectly. It must be clearly understood that these can happen even when everything is done following the highest safety standards.  No treatment: You can choose not to proceed with the proposed treatment alternative. The "PRO(s)" would include: avoiding the risk of complications associated with the therapy. The "CON(s)" would include: not getting any of the treatment benefits. These benefits fall under one of three categories: diagnostic; therapeutic; and/or palliative. Diagnostic benefits include: getting information which can ultimately lead to improvement of the disease or symptom(s). Therapeutic benefits are those associated with the successful treatment of the disease. Finally, palliative benefits are those related to the decrease of the primary symptoms, without necessarily curing the condition (example: decreasing the pain from a flare-up of a chronic condition, such as incurable terminal cancer).  General Risks and Complications: These are associated to most interventional treatments. They can occur alone, or in combination. They fall under one of the following six (6) categories: no benefit or worsening of symptoms;  bleeding; infection; nerve damage; allergic reactions; and/or death. No benefits or worsening of symptoms: In Medicine there are no guarantees, only probabilities. No healthcare provider can ever guarantee that a medical treatment will work, they can only state the probability that it may. Furthermore, there is always the possibility that the condition may worsen, either directly, or indirectly, as a consequence of the treatment. Bleeding: This is more common if the patient is taking a blood thinner, either prescription or over the counter (example: Goody Powders, Fish oil, Aspirin, Garlic, etc.), or if suffering a condition associated with impaired coagulation (example: Hemophilia, cirrhosis of the liver, low platelet counts, etc.). However, even if you do not have one on these, it can still happen. If you have any of these conditions, or take one of these drugs, make sure to notify your treating physician. Infection: This is more common in patients with a compromised immune system, either due to disease (example: diabetes, cancer, human immunodeficiency virus [HIV], etc.), or due to medications or treatments (example: therapies used to treat cancer and rheumatological diseases). However, even if you do not have one on these, it can still happen. If you have any of these conditions, or take one of these drugs, make sure to notify your treating physician. Nerve Damage: This is more common when the treatment is an invasive one, but it can also happen with the use of medications, such as those used in the treatment of cancer. The damage can occur to small secondary nerves, or to large primary ones, such as those in the spinal cord and brain. This damage may be temporary or permanent and it may lead to impairments that can range from temporary numbness to permanent paralysis and/or brain death. Allergic Reactions: Any time a substance or material comes in contact with our body, there is the possibility of an  allergic reaction. These can range from a mild skin rash (contact dermatitis) to a severe systemic reaction (anaphylactic reaction), which can result in death. Death: In general, any medical intervention can result in death, most of the time due to an unforeseen complication. ____________________________________________________________________________________________    ____________________________________________________________________________________________  Opioid Pain Medication Update  To: All patients taking opioid pain medications. (I.e.: hydrocodone, hydromorphone, oxycodone, oxymorphone, morphine, codeine, methadone, tapentadol, tramadol, buprenorphine, fentanyl, etc.)  Re: Updated review of side effects and adverse reactions of opioid analgesics, as well as new information about long term effects of this class of medications.  Direct risks of long-term opioid therapy are not limited to opioid addiction and overdose. Potential medical risks include serious fractures, breathing problems during sleep, hyperalgesia, immunosuppression, chronic constipation, bowel obstruction, myocardial infarction, and tooth decay secondary to xerostomia.  Unpredictable adverse effects that can occur even if you take your medication correctly: Cognitive impairment, respiratory depression, and death. Most people think that if they take their medication "correctly", and "as instructed", that they will be safe. Nothing could be farther from the truth. In reality, a significant amount of recorded deaths associated with the use of opioids has occurred in individuals that had taken the medication for a long time, and were taking their medication correctly. The following are examples of how this can happen: Patient taking his/her medication for a long time, as instructed, without any side effects, is given a certain antibiotic or another unrelated medication, which in turn triggers a "Drug-to-drug interaction"  leading to disorientation, cognitive impairment, impaired reflexes, respiratory depression or an untoward event leading to serious bodily harm or injury, including death.  Patient taking  his/her medication for a long time, as instructed, without any side effects, develops an acute impairment of liver and/or kidney function. This will lead to a rapid inability of the body to breakdown and eliminate their pain medication, which will result in effects similar to an "overdose", but with the same medicine and dose that they had always taken. This again may lead to disorientation, cognitive impairment, impaired reflexes, respiratory depression or an untoward event leading to serious bodily harm or injury, including death.  A similar problem will occur with patients as they grow older and their liver and kidney function begins to decrease as part of the aging process.  Background information: Historically, the original case for using long-term opioid therapy to treat chronic noncancer pain was based on safety assumptions that subsequent experience has called into question. In 1996, the American Pain Society and the [place] [employer] issued a consensus statement supporting long-term opioid therapy. This statement acknowledged the dangers of opioid prescribing but concluded that the risk for addiction was low; respiratory depression induced by opioids was short-lived, occurred mainly in opioid-naive patients, and was antagonized by pain; tolerance was not a common problem; and efforts to control diversion should not constrain opioid prescribing. This has now proven to be wrong. Experience regarding the risks for opioid addiction, misuse, and overdose in community practice has failed to support these assumptions.  According to the Centers for Disease Control and Prevention, fatal overdoses involving opioid analgesics have increased sharply over the past decade. Currently, more than 96,700 people die  from drug overdoses every year. Opioids are a factor in 7 out of every 10 overdose deaths. Deaths from drug overdose have surpassed motor vehicle accidents as the leading cause of death for individuals between the ages of 23 and 25.  Clinical data suggest that neuroendocrine dysfunction may be very common in both men and women, potentially causing hypogonadism, erectile dysfunction, infertility, decreased libido, osteoporosis, and depression. Recent studies linked higher opioid dose to increased opioid-related mortality. Controlled observational studies reported that long-term opioid therapy may be associated with increased risk for cardiovascular events. Subsequent meta-analysis concluded that the safety of long-term opioid therapy in elderly patients has not been proven.   Side Effects and adverse reactions: Common side effects: Drowsiness (sedation). Dizziness. Nausea and vomiting. Constipation. Physical dependence -- Dependence often manifests with withdrawal symptoms when opioids are discontinued or decreased. Tolerance -- As you take repeated doses of opioids, you require increased medication to experience the same effect of pain relief. Respiratory depression -- This can occur in healthy people, especially with higher doses. However, people with COPD, asthma or other lung conditions may be even more susceptible to fatal respiratory impairment.  Uncommon side effects: An increased sensitivity to feeling pain and extreme response to pain (hyperalgesia). Chronic use of opioids can lead to this. Delayed gastric emptying (the process by which the contents of your stomach are moved into your small intestine). Muscle rigidity. Immune system and hormonal dysfunction. Quick, involuntary muscle jerks (myoclonus). Arrhythmia. Itchy skin (pruritus). Dry mouth (xerostomia).  Long-term side effects: Chronic constipation. Sleep-disordered breathing (SDB). Increased risk of bone  fractures. Hypothalamic-pituitary-adrenal dysregulation. Increased risk of overdose.  RISKS: Fractures and Falls:  Opioids increase the risk and incidence of falls. This is of particular importance in elderly patients.  Endocrine System:  Long-term administration is associated with endocrine abnormalities (endocrinopathies). (Also known as Opioid-induced Endocrinopathy) Influences on both the hypothalamic-pituitary-adrenal axis?and the hypothalamic-pituitary-gonadal axis have been demonstrated with consequent hypogonadism and adrenal  insufficiency in both sexes. Hypogonadism and decreased levels of dehydroepiandrosterone sulfate have been reported in men and women. Endocrine effects include: Amenorrhoea in women (abnormal absence of menstruation) Reduced libido in both sexes Decreased sexual function Erectile dysfunction in men Hypogonadisms (decreased testicular function with shrinkage of testicles) Infertility Depression and fatigue Loss of muscle mass Anxiety Depression Immune suppression Hyperalgesia Weight gain Anemia Osteoporosis Patients (particularly women of childbearing age) should avoid opioids. There is insufficient evidence to recommend routine monitoring of asymptomatic patients taking opioids in the long-term for hormonal deficiencies.  Immune System: Human studies have demonstrated that opioids have an immunomodulating effect. These effects are mediated via opioid receptors both on immune effector cells and in the central nervous system. Opioids have been demonstrated to have adverse effects on antimicrobial response and anti-tumour surveillance. Buprenorphine has been demonstrated to have no impact on immune function.  Opioid Induced Hyperalgesia: Human studies have demonstrated that prolonged use of opioids can lead to a state of abnormal pain sensitivity, sometimes called opioid induced hyperalgesia (OIH). Opioid induced hyperalgesia is not usually seen in the  absence of tolerance to opioid analgesia. Clinically, hyperalgesia may be diagnosed if the patient on long-term opioid therapy presents with increased pain. This might be qualitatively and anatomically distinct from pain related to disease progression or to breakthrough pain resulting from development of opioid tolerance. Pain associated with hyperalgesia tends to be more diffuse than the pre-existing pain and less defined in quality. Management of opioid induced hyperalgesia requires opioid dose reduction.  Cancer: Chronic opioid therapy has been associated with an increased risk of cancer among noncancer patients with chronic pain. This association was more evident in chronic strong opioid users. Chronic opioid consumption causes significant pathological changes in the small intestine and colon. Epidemiological studies have found that there is a link between opium dependence and initiation of gastrointestinal cancers. Cancer is the second leading cause of death after cardiovascular disease. Chronic use of opioids can cause multiple conditions such as GERD, immunosuppression and renal damage as well as carcinogenic effects, which are associated with the incidence of cancers.   Mortality: Long-term opioid use has been associated with increased mortality among patients with chronic non-cancer pain (CNCP).  Prescription of long-acting opioids for chronic noncancer pain was associated with a significantly increased risk of all-cause mortality, including deaths from causes other than overdose.  Reference: Von Korff M, Kolodny A, Deyo RA, Chou R. Long-term opioid therapy reconsidered. Ann Intern Med. 2011 Sep 6;155(5):325-8. doi: 10.7326/[id number]. PMID: [id number]; PMCIDXX:1631110. Morley Kos, Hayward RA, Dunn KM, [place] KP. Risk of adverse events in patients prescribed long-term opioids: A cohort study in the [place] [facility] [employer]. Eur J Pain. 2019  May;23(5):908-922. doi: 10.1002/ejp.1357. Epub 2019 Jan 31. PMID: [id number]. Colameco S, Coren JS, Ciervo CA. Continuous opioid treatment for chronic noncancer pain: a time for moderation in prescribing. Postgrad Med. 2009 Jul;121(4):61-6. doi: 10.3810/pgm.2009.07.2032. PMID: [id number]. Heywood Bene RN, Alva SD, Blazina I, Rosalio Loud, Bougatsos C, Deyo RA. The effectiveness and risks of long-term opioid therapy for chronic pain: a systematic review for a [employer] of [employer] Pathways to [employer]. Ann Intern Med. 2015 Feb 17;162(4):276-86. doi: [id number]. PMID: [id number]. Marjory Sneddon [facility], Makuc DM. NCHS Data Brief No. 22. [place]: Centers for Disease Control and Prevention; 2009. Sep, Increase in Fatal Poisonings Involving Opioid Analgesics in the [place], 1999-2006. Song IA, Choi HR, Oh TK. Long-term opioid use and mortality in patients with  chronic non-cancer pain: Ten-year follow-up study in [place] from 2010 through 2019. EClinicalMedicine. 2022 Jul 18;51:101558. doi: [id number]/[id number]. PMID: [id number]; PMCIDOX:8550940. Huser, W., Schubert, T., Vogelmann, T. et al. All-cause mortality in patients with long-term opioid therapy compared with non-opioid analgesics for chronic non-cancer pain: a database study. [place] Med 18, 162 (2020). [email or web address] Rashidian H, Roxy Cedar, Malekzadeh R, Haghdoost AA. An Ecological Study of the Association between Opiate Use and Incidence of Cancers. Addict Health. 2016 Fall;8(4):252-260. PMID: [id number]; PMCIDQI:9185013.  Our Goal: Our goal is to control your pain with means other than the use of opioid pain medications.  Our Recommendation: Talk to your physician about coming off of these medications. We can assist you with the tapering down and stopping these medicines. Based on the new information, even if you cannot completely stop the medication, a  decrease in the dose may be associated with a lesser risk. Ask for other means of controlling the pain. Decrease or eliminate those factors that significantly contribute to your pain such as smoking, obesity, and a diet heavily tilted towards "inflammatory" nutrients.  Last Updated: 04/29/2022   ____________________________________________________________________________________________     ____________________________________________________________________________________________  National Pain Medication Shortage  The [place] is experiencing worsening drug shortages. These have had a negative widespread effect on patient care and treatment. Not expected to improve any time soon. Predicted to last past 2029.   Drug shortage list (generic names) Oxycodone IR Oxycodone/APAP Oxymorphone IR Hydromorphone Hydrocodone/APAP Morphine  Where is the problem?  Manufacturing and supply level.  Will this shortage affect you?  Only if you take any of the above pain medications.  How? You may be unable to fill your prescription.  Your pharmacist may offer a "partial fill" of your prescription. (Warning: Do not accept partial fills.) Prescriptions partially filled cannot be transferred to another pharmacy. Read our Medication Rules and Regulation. Depending on how much medicine you are dependent on, you may experience withdrawals when unable to get the medication.  Recommendations: Consider ending your dependence on opioid pain medications. Ask your pain specialist to assist you with the process. Consider switching to a medication currently not in shortage, such as Buprenorphine. Talk to your pain specialist about this option. Consider decreasing your pain medication requirements by managing tolerance thru "Drug Holidays". This may help minimize withdrawals, should you run out of medicine. Control your pain thru the use of non-pharmacological interventional therapies.   Your  prescriber: Prescribers cannot be blamed for shortages. Medication manufacturing and supply issues cannot be fixed by the prescriber.   NOTE: The prescriber is not responsible for supplying the medication, or solving supply issues. Work with your pharmacist to solve it. The patient is responsible for the decision to take or continue taking the medication and for identifying and securing a legal supply source. By law, supplying the medication is the job and responsibility of the pharmacy. The prescriber is responsible for the evaluation, monitoring, and prescribing of these medications.   Prescribers will NOT: Re-issue prescriptions that have been partially filled. Re-issue prescriptions already sent to a pharmacy.  Re-send prescriptions to a different pharmacy because yours did not have your medication. Ask pharmacist to order more medicine or transfer the prescription to another pharmacy. (Read below.)  New 2023 regulation: "October 31, 2021 Revised Regulation Allows DEA-Registered Pharmacies to Transfer Electronic Prescriptions at a Patient's Request [place] Patients now have the ability to request their electronic prescription be transferred to another pharmacy without having  to go back to their practitioner to initiate the request. This revised regulation went into effect on Monday, October 27, 2021.     At a patient's request, a DEA-registered retail pharmacy can now transfer an electronic prescription for a controlled substance (schedules II-V) to another DEA-registered retail pharmacy. Prior to this change, patients would have to go through their practitioner to cancel their prescription and have it re-issued to a different pharmacy. The process was taxing and time consuming for both patients and practitioners.    The Drug Enforcement Administration [facility]) published its intent to revise the process for transferring electronic prescriptions on  January 19, 2020.  The final rule was published in the federal register on September 25, 2021 and went into effect 30 days later.  Under the final rule, a prescription can only be transferred once between pharmacies, and only if allowed under existing state or other applicable law. The prescription must remain in its electronic form; may not be altered in any way; and the transfer must be communicated directly between two licensed pharmacists. It's important to note, any authorized refills transfer with the original prescription, which means the entire prescription will be filled at the same pharmacy".  Reference: [email or web address] [facility] website announcement)  [email or web address] ([place])   [employer] / Vol. 88, No. 143 / Thursday, September 25, 2021 / Rules and Regulations DEPARTMENT OF JUSTICE  Drug Enforcement Administration  21 CFR Part 1306  [Docket No. [id number]]  RIN [id number] Transfer of Electronic Prescriptions for Schedules II-V Controlled Substances Between Pharmacies for Initial Filling  ____________________________________________________________________________________________     ____________________________________________________________________________________________  Transfer of Pain Medication between Pharmacies  Re: 2023 DEA Clarification on existing regulation  Published on DEA Website: October 31, 2021  Title: Revised Regulation Allows DEA-Registered Pharmacies to Conservator, museum/gallery Prescriptions at a Patient's Request [place]  "Patients now have the ability to request their electronic prescription be transferred to another pharmacy without having to go back to their practitioner to initiate the request. This revised regulation went into effect  on Monday, October 27, 2021.     At a patient's request, a DEA-registered retail pharmacy can now transfer an electronic prescription for a controlled substance (schedules II-V) to another DEA-registered retail pharmacy. Prior to this change, patients would have to go through their practitioner to cancel their prescription and have it re-issued to a different pharmacy. The process was taxing and time consuming for both patients and practitioners.    The Drug Enforcement Administration [facility]) published its intent to revise the process for transferring electronic prescriptions on January 19, 2020.  The final rule was published in the federal register on September 25, 2021 and went into effect 30 days later.  Under the final rule, a prescription can only be transferred once between pharmacies, and only if allowed under existing state or other applicable law. The prescription must remain in its electronic form; may not be altered in any way; and the transfer must be communicated directly between two licensed pharmacists. It's important to note, any authorized refills transfer with the original prescription, which means the entire prescription will be filled at the same pharmacy."    REFERENCES: 1. DEA website announcement [email or web address]  2. Department of Justice website  [email or web address]  3. DEPARTMENT OF JUSTICE Drug Enforcement Administration 21 CFR Part L5500647 [Docket No. [id number]] RIN 1117-AB64 "Transfer of Electronic Prescriptions for Schedules II-V Controlled Substances Between Pharmacies for Initial Filling"  ____________________________________________________________________________________________

## 2022-05-21 NOTE — Progress Notes (Signed)
Safety precautions to be maintained throughout the outpatient stay will include: orient to surroundings, keep bed in low position, maintain call bell within reach at all times, provide assistance with transfer out of bed and ambulation.  

## 2022-05-26 ENCOUNTER — Ambulatory Visit: Payer: Medicare HMO | Admitting: Physical Therapy

## 2022-05-26 ENCOUNTER — Ambulatory Visit (INDEPENDENT_AMBULATORY_CARE_PROVIDER_SITE_OTHER): Payer: Medicare HMO | Admitting: Family Medicine

## 2022-05-26 ENCOUNTER — Encounter: Payer: Self-pay | Admitting: Family Medicine

## 2022-05-26 VITALS — BP 110/60 | HR 48 | Ht 66.0 in | Wt 125.0 lb

## 2022-05-26 DIAGNOSIS — R69 Illness, unspecified: Secondary | ICD-10-CM | POA: Diagnosis not present

## 2022-05-26 DIAGNOSIS — G894 Chronic pain syndrome: Secondary | ICD-10-CM | POA: Diagnosis not present

## 2022-05-26 DIAGNOSIS — G20B2 Parkinson's disease with dyskinesia, with fluctuations: Secondary | ICD-10-CM | POA: Diagnosis not present

## 2022-05-26 DIAGNOSIS — M545 Low back pain, unspecified: Secondary | ICD-10-CM | POA: Diagnosis not present

## 2022-05-26 DIAGNOSIS — G8929 Other chronic pain: Secondary | ICD-10-CM

## 2022-05-26 DIAGNOSIS — F411 Generalized anxiety disorder: Secondary | ICD-10-CM

## 2022-05-26 MED ORDER — OXYCODONE HCL 5 MG PO TABS: 5.0000 mg | ORAL_TABLET | Freq: Three times a day (TID) | ORAL | 0 refills | Status: DC | PRN

## 2022-05-26 NOTE — Progress Notes (Signed)
Subjective:    Patient ID: Joseph Arroyo, male    DOB: [birth year], 81 y.o.   MRN: [id number]  Joseph Arroyo is a 82 y.o. male presenting on 05/26/2022 for Pain Management and Parkinsons   HPI  Parkinson's Disease, dyskinesia Chronic Pain Syndrome Lumbar DDD, Scoliosis, Chronic Back Pain Generalized Anxiety Disorder  Followed by specialist team: - [facility], med management and PT - Lynder Parents MD [facility] - Dr Dossie Arbour [facility] Pain Management  Today primary concern is his chronic pain that is secondary to Parkinsons and his lumbar DDD with scoliosis and arthritis back pain. Upcoming order for thermal Radiofrequency Ablation for his back. However they were advised recently on 05/21/22 that they cannot offer long term pain management with opiate therapy due to their clinic limitations with opiate management. They recommended that he follow up with his PCP for pain management opiate therapy, they did recommend Oxycodone IR 5mg  q 8 hr AS NEEDED and re evaluate after 30 days, may adjust dose up if needed  Currently Taking Tylenol 1000mg  THREE TIMES A DAY some missed doses, AS NEEDED with good results  Taking Ibuprofen more rarely  Asking for lab result for liver function on Tylenol today Last lab 09/2021, due for next panel in 10/2022  He is interested in pain management with medication now in addition to procedures to help improve his daily function. His wife is primary caregiver at home, she will need more help as well. He needs more assistance with dressing and bathing and ambulating. He has pain that limits his function  Generalized Anxiety is worsening in setting of decline in health. He is on Xanax 0.25mg  alprazolam AS NEEDED per Psychiatry, asking about safety and interaction  They are interested in pursuing hospital bed at home. They have other equipment already commode, walkers, ramp and hand rails.  He does require position that cannot be achieved  by regular bed due to parkinsons and back pain scoliosis, he requires legs to be lifted and frequent position changes to avoid bed sore or pressure ulcer. He has muscle contracture of legs at times and weakness generalized with back and lower extremities. Tremors with parkinson interfere with his ability to transition and sit up in regular bed     05/26/2022    4:13 PM 02/10/2022    2:32 PM 01/15/2022    1:55 PM  Depression screen PHQ 2/9  Decreased Interest 1 0 0  Down, Depressed, Hopeless 1 0 0  PHQ - 2 Score 2 0 0  Altered sleeping 1    Tired, decreased energy 1    Change in appetite 0    Feeling bad or failure about yourself  0    Trouble concentrating 2    Moving slowly or fidgety/restless 0    Suicidal thoughts 0    PHQ-9 Score 6    Difficult doing work/chores Not difficult at all      Social History   Tobacco Use   Smoking status: Never   Smokeless tobacco: Never  Vaping Use   Vaping Use: Never used  Substance Use Topics   Alcohol use: No    Alcohol/week: 0.0 standard drinks of alcohol   Drug use: No    Review of Systems Per HPI unless specifically indicated above     Objective:    BP 110/60 (BP Location: Left Arm, Patient Position: Sitting)   Pulse (!) 48   Ht 5\' 6"  (1.676 m)   Wt 125 lb (56.7 kg)  SpO2 97%   BMI 20.18 kg/m   Wt Readings from Last 3 Encounters:  05/26/22 125 lb (56.7 kg)  05/21/22 125 lb (56.7 kg)  05/07/22 125 lb (56.7 kg)    Physical Exam Vitals and nursing note reviewed.  Constitutional:      General: He is not in acute distress.    Appearance: He is well-developed. He is not diaphoretic.     Comments: Chronically ill 82 yr male, laying down, mild discomfort with some back pain, he has some he is cooperative  HENT:     Head: Normocephalic and atraumatic.  Eyes:     General:        Right eye: No discharge.        Left eye: No discharge.     Conjunctiva/sclera: Conjunctivae normal.  Neck:     Thyroid: No thyromegaly.   Cardiovascular:     Rate and Rhythm: Regular rhythm. Bradycardia present.     Pulses: Normal pulses.     Heart sounds: Normal heart sounds. No murmur heard. Pulmonary:     Effort: Pulmonary effort is normal. No respiratory distress.     Breath sounds: Normal breath sounds. No wheezing or rales.  Musculoskeletal:     Cervical back: Normal range of motion and neck supple.     Comments: Some muscle rigidity and he is laying down with leg support, requires assistance to stand and ambulate. Some loss of muscle mass density  Lymphadenopathy:     Cervical: No cervical adenopathy.  Skin:    General: Skin is warm and dry.     Findings: No erythema or rash.  Neurological:     Mental Status: He is alert and oriented to person, place, and time. Mental status is at baseline.     Motor: Weakness present.     Gait: Gait abnormal.     Comments: Tremors resting.  Psychiatric:        Behavior: Behavior normal.     Comments: Well groomed, good eye contact, normal speech and thoughts      Results for orders placed or performed in visit on 10/10/21  TSH  Result Value Ref Range   TSH 0.97 0.40 - 4.50 mIU/L  PSA  Result Value Ref Range   PSA 1.33 < OR = 4.00 ng/mL  Hemoglobin A1c  Result Value Ref Range   Hgb A1c MFr Bld 4.9 <5.7 % of total Hgb   Mean Plasma Glucose 94 mg/dL   eAG (mmol/L) 5.2 mmol/L  Lipid panel  Result Value Ref Range   Cholesterol 138 <200 mg/dL   HDL 63 > OR = 40 mg/dL   Triglycerides 58 <150 mg/dL   LDL Cholesterol (Calc) 62 mg/dL (calc)   Total CHOL/HDL Ratio 2.2 <5.0 (calc)   Non-HDL Cholesterol (Calc) 75 <130 mg/dL (calc)  CBC with Differential/Platelet  Result Value Ref Range   WBC 5.6 3.8 - 10.8 Thousand/uL   RBC 4.42 4.20 - 5.80 Million/uL   Hemoglobin 14.5 13.2 - 17.1 g/dL   HCT 42.3 38.5 - 50.0 %   MCV 95.7 80.0 - 100.0 fL   MCH 32.8 27.0 - 33.0 pg   MCHC 34.3 32.0 - 36.0 g/dL   RDW 11.7 11.0 - 15.0 %   Platelets 193 140 - 400 Thousand/uL   MPV 9.5  7.5 - 12.5 fL   Neutro Abs 3,629 1,500 - 7,800 cells/uL   Lymphs Abs 1,210 850 - 3,900 cells/uL   Absolute Monocytes 442 200 - 950 cells/uL  Eosinophils Absolute 258 15 - 500 cells/uL   Basophils Absolute 62 0 - 200 cells/uL   Neutrophils Relative % 64.8 %   Total Lymphocyte 21.6 %   Monocytes Relative 7.9 %   Eosinophils Relative 4.6 %   Basophils Relative 1.1 %  COMPLETE METABOLIC PANEL WITH GFR  Result Value Ref Range   Glucose, Bld 85 65 - 99 mg/dL   BUN 20 7 - 25 mg/dL   Creat 0.77 0.70 - 1.28 mg/dL   eGFR 91 > OR = 60 mL/min/1.10m2   BUN/Creatinine Ratio SEE NOTE: 6 - 22 (calc)   Sodium 133 (L) 135 - 146 mmol/L   Potassium 4.2 3.5 - 5.3 mmol/L   Chloride 97 (L) 98 - 110 mmol/L   CO2 31 20 - 32 mmol/L   Calcium 9.0 8.6 - 10.3 mg/dL   Total Protein 6.3 6.1 - 8.1 g/dL   Albumin 4.0 3.6 - 5.1 g/dL   Globulin 2.3 1.9 - 3.7 g/dL (calc)   AG Ratio 1.7 1.0 - 2.5 (calc)   Total Bilirubin 0.7 0.2 - 1.2 mg/dL   Alkaline phosphatase (APISO) 44 35 - 144 U/L   AST 16 10 - 35 U/L   ALT 6 (L) 9 - 46 U/L  VITAMIN D 25 Hydroxy (Vit-D Deficiency, Fractures)  Result Value Ref Range   Vit D, 25-Hydroxy 42 30 - 100 ng/mL      Assessment & Plan:   Problem List Items Addressed This Visit     Chronic pain syndrome (Chronic)   Relevant Medications   oxyCODONE (OXY IR/ROXICODONE) 5 MG immediate release tablet   Other Relevant Orders   AMB Referral to Chronic Care Management Services   Parkinson's disease with dyskinesia - Primary (Chronic)   Relevant Medications   oxyCODONE (OXY IR/ROXICODONE) 5 MG immediate release tablet   Other Relevant Orders   COMPLETE METABOLIC PANEL WITH GFR   AMB Referral to Chronic Care Management Services   GAD (generalized anxiety disorder)   Relevant Orders   AMB Referral to Chronic Care Management Services   Other Visit Diagnoses     Chronic left-sided low back pain without sciatica       Relevant Medications   oxyCODONE (OXY IR/ROXICODONE) 5 MG  immediate release tablet   Other Relevant Orders   AMB Referral to Chronic Care Management Services       Parkinson's Disease Managed by Neurology on medication and PT Continues to experience gradual disease progression Impacting his daily function ADLs, mobility and other function  Per his [facility] Pain Management specialist they are requesting that his PCP manage his opiate therapy for his pain and function.  Will initiate opioid therapy to assist his patient's function. We discussed at length today about the risk and benefit of opioid therapy, from dependence to addiction and side effects.  Ordered Oxycodone IR 5mg  as needed up to 3 times a day for pain. Will order 45 pill count today first and can adjust according to usage. They plan to not use regularly initially.  Okay to keep taking Tylenol Extra Strength 500mg  x 2 = 1000mg   up to 3 times a day as needed  Check CMET lab today for his Liver function and nutritional status / creatinine.  Ibuprofen as needed  Reviewed, Xanax is okay to keep taking, use with caution. Followed by Psychiatry.  He will benefit from hospital bed DME The patient requires a hospital bed because he/she requires positioning of the body in ways not feasible with an  ordinary bed or to relieve pain. The patient requires a regular hospital bed. A gel overlay is required as the patient has limited mobility AND the patient has an impaired nutritional status.  Referral to Chronic Care Management today RN + LCSW to assist with DME / coordinating future care, home health, home aide / future [place] placement if indicated.  Will need info on preferred DME location when ordering hospital bed.  Future consider referral to Palliative Care for further medication management assistance and symptom visits as needed.  Orders Placed This Encounter  Procedures   COMPLETE METABOLIC PANEL WITH GFR   AMB Referral to Chronic Care Management Services    Referral  Priority:   Routine    Referral Type:   Consultation    Referral Reason:   Chronic Care Management (CCM)    Number of Visits Requested:   1     Meds ordered this encounter  Medications   oxyCODONE (OXY IR/ROXICODONE) 5 MG immediate release tablet    Sig: Take 1 tablet (5 mg total) by mouth 3 (three) times daily as needed for severe pain.    Dispense:  45 tablet    Refill:  0      Follow up plan: Return in about 6 weeks (around 07/07/2022) for 6-8 weeks, follow up 30 days after start Pain Medication / Parkinsons.   Nobie Putnam, [facility] 05/26/2022, 4:02 PM

## 2022-05-26 NOTE — Patient Instructions (Addendum)
Thank you for coming to the office today.  Referral to Social Work / Case Management - Chronic Care Mangement Team  Nurse = Noreene Larsson Social Worker = Chrystal Land  They will contact you within approximately 2 weeks, and help coordinate any needs. They work directly with me, so we can solve problems.  I can order hospital bed once I get all of the specifics, we need to confirm which medical equipment store the rx is going to etc.  Going forward they can help with locating home health aide or other help, or future Long Term Care  ----  Ordered Oxycodone IR 5mg  as needed up to 3 times a day for pain, as per Dr Dossie Arbour, we can adjust this pain medicine going forward, as discussed caution with regular every day dosing, it can eventually cause dependence and risk withdrawal.  Okay to keep taking Tylenol Extra Strength 500mg  x 2 = 1000mg   up to 3 times a day as needed  Ibuprofen as needed  Xanax is okay to keep taking, use with caution.  [facility] [address] [place], [place]  [address] Ph: [phone] Fax: [place] [address] [place], [place] [address] Ph: [phone] Fax: [place] [address] [place], [place] [address] Pharmacy Phone: [phone] FAX: [phone]  [facility] ([employer]el[employer] etc) Retail: [phone] Customer Service: [phone] Toll Free: [phone] FAX: [phone]  [facility] [address] [place],  [address] Open until [place] Phone: [phone] Fax: [phone]  Please schedule a Follow-up Appointment to: Return in about 6 weeks (around 07/07/2022) for 6-8 weeks, follow up 30 days after start Pain Medication / Parkinsons.  If you have any other questions or concerns, please feel free to call the office or send a message through [place]. You may also schedule an earlier appointment if  necessary.  Additionally, you may be receiving a survey about your experience at our office within a few days to 1 week by e-mail or mail. We value your feedback.  Nobie Putnam, DO [place]

## 2022-05-27 ENCOUNTER — Telehealth: Payer: Self-pay

## 2022-05-27 LAB — COMPLETE METABOLIC PANEL WITH GFR
AG Ratio: 1.6 (calc) (ref 1.0–2.5)
ALT: 11 U/L (ref 9–46)
AST: 19 U/L (ref 10–35)
Albumin: 4.1 g/dL (ref 3.6–5.1)
Alkaline phosphatase (APISO): 53 U/L (ref 35–144)
BUN/Creatinine Ratio: 33 (calc) — ABNORMAL HIGH (ref 6–22)
BUN: 22 mg/dL (ref 7–25)
CO2: 29 mmol/L (ref 20–32)
Calcium: 9.4 mg/dL (ref 8.6–10.3)
Chloride: 92 mmol/L — ABNORMAL LOW (ref 98–110)
Creat: 0.67 mg/dL — ABNORMAL LOW (ref 0.70–1.22)
Globulin: 2.5 g/dL (calc) (ref 1.9–3.7)
Glucose, Bld: 92 mg/dL (ref 65–99)
Potassium: 4.4 mmol/L (ref 3.5–5.3)
Sodium: 130 mmol/L — ABNORMAL LOW (ref 135–146)
Total Bilirubin: 0.5 mg/dL (ref 0.2–1.2)
Total Protein: 6.6 g/dL (ref 6.1–8.1)
eGFR: 94 mL/min/{1.73_m2} (ref >=60)

## 2022-05-27 NOTE — Progress Notes (Signed)
  Chronic Care Management   Note  05/27/2022 Name: Joseph Arroyo MRN: [id number] DOB: March 10, 1940  Joseph Arroyo is a 82 y.o. year old male who is a primary care patient of Olin Hauser, DO. I reached out to [employer] by phone today in response to a referral sent by Joseph Arroyo's PCP.  The first contact attempt was unsuccessful.   Follow up plan: Additional outreach attempts will be made.  Noreene Larsson, [place], [place] [address] Direct Dial: [phone] [email or web address]

## 2022-05-27 NOTE — Progress Notes (Signed)
  Chronic Care Management   Note  05/27/2022 Name: Joseph Arroyo MRN: [id number] DOB: 1940/11/04  Biagio Morocho Bents is a 82 y.o. year old male who is a primary care patient of Olin Hauser, DO. I reached out to [employer] by phone today in response to a referral sent by Mr. Laakea Petrick Villamizar's PCP.  Mr. Fithen was given information about Chronic Care Management services today including:  CCM service includes personalized support from designated clinical staff supervised by the physician, including individualized plan of care and coordination with other care providers 24/7 contact phone numbers for assistance for urgent and routine care needs. Service will only be billed when office clinical staff spend 20 minutes or more in a month to coordinate care. Only one practitioner may furnish and bill the service in a calendar month. The patient may stop CCM services at amy time (effective at the end of the month) by phone call to the office staff. The patient will be responsible for cost sharing (co-pay) or up to 20% of the service fee (after annual deductible is met)  Mr. Chalmus Vandijk Bove  agreedto scheduling an appointment with the CCM RN Case Manager   Follow up plan: Patient agreed to scheduled appointment with RN Case Manager on 06/03/2022 LCSW 06/08/2022(date/time).  Noreene Larsson, [place], [place] [address] Direct Dial: [phone] [email or web address]

## 2022-05-28 ENCOUNTER — Ambulatory Visit: Payer: Medicare HMO | Admitting: Physical Therapy

## 2022-06-01 ENCOUNTER — Telehealth: Payer: Self-pay | Admitting: Family Medicine

## 2022-06-01 ENCOUNTER — Ambulatory Visit: Payer: Self-pay | Admitting: *Deleted

## 2022-06-01 NOTE — Telephone Encounter (Signed)
Summary: constipation   Patients spouse stated provider prescribed oxyCODONE (OXY IR/ROXICODONE) 5 MG immediate release tablet and it has caused constipation and she does not want him to take it again before speaking with the dr. [name] is needing something for constipation and a call from nurse as what she can do in the meantime. Please call spouse back at [phone] or [phone].  ----- Message from Steele Sizer sent at 06/01/2022  1:52 PM EDT ----- Patients spouse stated provider prescribed oxyCODONE (OXY IR/ROXICODONE) 5 MG immediate release tablet and it has caused constipation and she does not want him to take it again before speaking with the dr. [name] is needing something for constipation and a call from nurse as what she can do in the meantime. Please call spouse back at [phone] or [phone].       Chief Complaint: constipation per patient's wife since taking oxycodone from pain clinic Symptoms: normal pattern loose BMs. Started taking oxycodone and constipation reported. Stopped oxy and able to have BM. Started back on oxy and constipation occurred again. LBM today . Requesting advise  Frequency: Thursday 05/28/22 Pertinent Negatives: Patient denies abdominal pain no bloating no fever no vomiting  Disposition: [] ED /[] Urgent Care (no appt availability in office) / [] Appointment(In office/virtual)/ []  [place] [facility]/ [] Home Care/ [] Refused Recommended Disposition /[] [facility] [facility]/ [x]  Follow-up with PCP Additional Notes:   Recommended to wife if patient continues to take oxycodone drink plenty of fluids as long as not on fluid restrictions, eat healthy meals, drink warmer liquids. Stool softeners per direction on OTC medication. Please advise . Patient's wife would like call back from PCP / nurse what PCP recommends.      Reason for Disposition  Taking new prescription medication  Answer Assessment - Initial Assessment Questions 1. STOOL PATTERN OR  FREQUENCY: "How often do you have a bowel movement (BM)?"  (Normal range: 3 times a day to every 3 days)  "When was your last BM?"       Normal pattern is loose bowels. Having bouts of constipation after taking oxycodone. LBM today 2. STRAINING: "Do you have to strain to have a BM?"      Yes early Sunday morning per patient's wife  3. RECTAL PAIN: "Does your rectum hurt when the stool comes out?" If Yes, ask: "Do you have hemorrhoids? How bad is the pain?"  (Scale 1-10; or mild, moderate, severe)     na 4. STOOL COMPOSITION: "Are the stools hard?"      na 5. BLOOD ON STOOLS: "Has there been any blood on the toilet tissue or on the surface of the BM?" If Yes, ask: "When was the last time?"     na 6. CHRONIC CONSTIPATION: "Is this a new problem for you?"  If No, ask: "How long have you had this problem?" (days, weeks, months)      Constipation since taking oxycodone  7. CHANGES IN DIET OR HYDRATION: "Have there been any recent changes in your diet?" "How much fluids are you drinking on a daily basis?"  "How much have you had to drink today?"     No change in diet,  8. MEDICINES: "Have you been taking any new medicines?" "Are you taking any narcotic pain medicines?" (e.g., Dilaudid, morphine, Percocet, Vicodin)     Oxycodone  9. LAXATIVES: "Have you been using any stool softeners, laxatives, or enemas?"  If Yes, ask "What, how often, and when was the last time?"     No  10. ACTIVITY:  "How much walking do you do every day?"  "Has your activity level decreased in the past week?"        na 11. CAUSE: "What do you think is causing the constipation?"        narcotic 12. OTHER SYMPTOMS: "Do you have any other symptoms?" (e.g., abdomen pain, bloating, fever, vomiting)       Denies abdominal pain no fever no vomiting  13. MEDICAL HISTORY: "Do you have a history of hemorrhoids, rectal fissures, or rectal surgery or rectal abscess?"         na 14. PREGNANCY: "Is there any chance you are pregnant?"  "When was your last menstrual period?"       na  Protocols used: Constipation-A-AH

## 2022-06-01 NOTE — Telephone Encounter (Signed)
Called them back spoke with [name] and Sherron Monday and advised miralax powder to take with the oxycodone dose, instead of other otc stool softener. They can alternate or adjust the regimen accordingly.  Nobie Putnam, [facility] 06/01/2022, 5:28 PM

## 2022-06-02 ENCOUNTER — Ambulatory Visit: Payer: Medicare HMO | Admitting: Physical Therapy

## 2022-06-03 ENCOUNTER — Ambulatory Visit (INDEPENDENT_AMBULATORY_CARE_PROVIDER_SITE_OTHER): Payer: Medicare HMO

## 2022-06-03 ENCOUNTER — Telehealth: Payer: Medicare HMO

## 2022-06-03 DIAGNOSIS — G8929 Other chronic pain: Secondary | ICD-10-CM

## 2022-06-03 DIAGNOSIS — G894 Chronic pain syndrome: Secondary | ICD-10-CM

## 2022-06-03 DIAGNOSIS — G20B2 Parkinson's disease with dyskinesia, with fluctuations: Secondary | ICD-10-CM

## 2022-06-03 DIAGNOSIS — M545 Low back pain, unspecified: Secondary | ICD-10-CM

## 2022-06-03 DIAGNOSIS — G20A2 Parkinson's disease without dyskinesia, with fluctuations: Secondary | ICD-10-CM

## 2022-06-03 DIAGNOSIS — F411 Generalized anxiety disorder: Secondary | ICD-10-CM

## 2022-06-03 DIAGNOSIS — F419 Anxiety disorder, unspecified: Secondary | ICD-10-CM

## 2022-06-03 DIAGNOSIS — M4186 Other forms of scoliosis, lumbar region: Secondary | ICD-10-CM

## 2022-06-03 DIAGNOSIS — M47817 Spondylosis without myelopathy or radiculopathy, lumbosacral region: Secondary | ICD-10-CM

## 2022-06-03 NOTE — Chronic Care Management (AMB) (Signed)
Chronic Care Management   CCM RN Visit Note  06/03/2022 Name: Joseph Arroyo MRN: [id number] DOB: [birth year]  Subjective: Joseph Arroyo is a 82 y.o. year old male who is a primary care patient of Olin Hauser, DO. The patient was referred to the Chronic Care Management team for assistance with care management needs subsequent to provider initiation of CCM services and plan of care.    Today's Visit:   Spoke to the patients wife, Joseph Arroyo with permission from the patient and the patient was also on the line for the call  for initial visit.     SDOH Interventions Today    Flowsheet Row Most Recent Value  SDOH Interventions   Food Insecurity Interventions Intervention Not Indicated  Housing Interventions Intervention Not Indicated  Transportation Interventions Intervention Not Indicated  Utilities Interventions Intervention Not Indicated  Alcohol Usage Interventions Intervention Not Indicated (Score <7)  Financial Strain Interventions Intervention Not Indicated  Physical Activity Interventions Intervention Not Indicated  Stress Interventions Intervention Not Indicated  Social Connections Interventions Intervention Not Indicated         Goals Addressed             This Visit's Progress    CCM Expected Outcome:  Monitor, Self-Manage and Reduce Symptoms of: Anxiety       Current Barriers:  Care Coordination needs related to social worker support, social work referral pending  in a patient with anxiety Chronic Disease Management support and education needs related to effective management of anxiety  Planned Interventions: Evaluation of current treatment plan related to anxiety and patient's adherence to plan as established by provider Provided education to patient re: call the office for changes in mood, anxiety, depression, or mental health needs  Reviewed medications with patient and discussed compliance. The patient is compliant with medications. Takes as  prescribed Social Work referral for ongoing support and education for anxiety and mental health needs  Discussed plans with patient for ongoing care management follow up and provided patient with direct contact information for care management team Advised patient to discuss changes in his mood, anxiety, depression, or mental health  with provider Screening for signs and symptoms of depression related to chronic disease state  Assessed social determinant of health barriers  Symptom Management: Take medications as prescribed   Attend all scheduled provider appointments Call provider office for new concerns or questions  call the Suicide and Crisis Lifeline: 988 call the [place] National Suicide Prevention Lifeline: (217) 774-8242 or TTY: 816-667-1247 TTY 647 705 5449) to talk to a trained counselor call 1-800-273-TALK (toll free, 24 hour hotline) if experiencing a Mental Health or [place]   Follow Up Plan: Telephone follow up appointment with care management team member scheduled for: 07-07-2022 at 145 pm       CCM Expected Outcome:  Monitor, Self-Manage and Reduce Symptoms of: Arthritis Spine, Back Pain       Current Barriers:  Care Coordination needs related to support and education for effective pain management  in a patient with Chronic pain and arthritis  Chronic Disease Management support and education needs related to effective management of chronic pain, arthritis, and scoliosis   Planned Interventions: Reviewed provider established plan for pain management. Currently the patient is stable with his pain management. He goes to the pain clinic but also is followed closely by pcp. The patients wife knows to call for acute changes. The patient was sitting at the time of the call and rates his back pain at a  5 today. The patient walks with a walker and had a fall about 2 weeks ago; Discussed importance of adherence to all scheduled medical appointments; Counseled on the  importance of reporting any/all new or changed pain symptoms or management strategies to pain management provider; Advised patient to report to care team affect of pain on daily activities; Discussed use of relaxation techniques and/or diversional activities to assist with pain reduction (distraction, imagery, relaxation, massage, acupressure, TENS, heat, and cold application; Reviewed with patient prescribed pharmacological and nonpharmacological pain relief strategies; Advised patient to discuss changes in level or intensity of pain, unresolved pain, questions and concerns with provider; Screening for signs and symptoms of depression related to chronic disease state;  Assessed social determinant of health barriers;  Review of fall prevention and safety concerns  Symptom Management: Take medications as prescribed   Attend all scheduled provider appointments Call provider office for new concerns or questions  call the Suicide and Crisis Lifeline: 988 call the [place] National Suicide Prevention Lifeline: 4781609797 or TTY: 463-234-0590 TTY 548 055 7443) to talk to a trained counselor call 1-800-273-TALK (toll free, 24 hour hotline) if experiencing a Mental Health or [place]   Follow Up Plan: Telephone follow up appointment with care management team member scheduled for: 07-07-2022 at 145 pm       CCM Expected Outcome:  Monitor, Self-Manage and Reduce Symptoms of: Parkinson's       Current Barriers:  Knowledge Deficits related to resources in the area for support in meeting the needs of the patient as his Parkinson's advances and needs change  Care Coordination needs related to support from the CCM team, education needs, and ongoing support for effective management of Parkinson's and other chronic conditions in a patient with Parkinson's Disease  Chronic Disease Management support and education needs related to effective management of PD  Planned Interventions: Evaluation  of current treatment plan related to PD and patient's adherence to plan as established by provider Advised patient to provide appropriate vaccination information to provider or CM team member at next visit Advised patient to call the [facility] after talking to the insurance and discussing with her husband if they want the pcp to order the hospital bed.  Provided education to patient re: the CCM team, resources available, support for the patient, information about myChart: The patients wife wishes for the link to be sent to her email at [email or web address]. This has been sent today. The patients wife also advised the need to provide a DPR for the patient for the future of speaking to the team. The patient gave permission for the [facility] to speak to his wife Joseph Arroyo.  Reviewed medications with patient and discussed compliance. The patient currently does not have a lot of medications and his wife states she is handling them well. Review of pharm D support if needed.  Collaborated with pcp and LCSW for current expressed needs  regarding PD and advancement of PD and possible future needs Provided patient with PD, falls and safety, and Water engineer related to resources to help with effective management of PD Social Work referral for education, support, and long term planning for the patient needs with the advancement of his PD Discussed plans with patient for ongoing care management follow up and provided patient with direct contact information for care management team Advised patient to discuss changes in PD, questions or concerns with provider Screening for signs and symptoms of depression related to chronic disease state  Assessed social determinant of health  barriers Encouraged the patients wife to get a notebook dedicated to the patients health needs and write down questions as she or the patient thinks of questions in regard to DME needs, changes in conditions, medication questions,  and other information important to meeting the needs of the patient.  The patient currently receives meals from [employer], has a tub bench and rails in the bathroom, frame for over the commode, and a ramp. The wife is going to call insurance about a hospital bed and let the RNCM know the desires of the patient.  Other resource that may be helpful for the patient is [facility] in [place] at [phone].   Symptom Management: Take medications as prescribed   Attend all scheduled provider appointments Call provider office for new concerns or questions  call the Suicide and Crisis Lifeline: 988 call the [place] National Suicide Prevention Lifeline: (276)204-8713 or TTY: 641-604-6713 TTY 865-230-1134) to talk to a trained counselor call 1-800-273-TALK (toll free, 24 hour hotline) if experiencing a Mental Health or [place]   Follow Up Plan: Telephone follow up appointment with care management team member scheduled for: 07-07-2022 at 145 pm          Plan:Telephone follow up appointment with care management team member scheduled for:  07-07-2022 at 145 pm  Noreene Larsson RN, MSN, CCM RN Care Manager  Chronic Care Management Direct Number: [phone]

## 2022-06-03 NOTE — Patient Instructions (Addendum)
Please call the care guide team at [phone] if you need to cancel or reschedule your appointment.   If you are experiencing a Mental Health or [place] or need someone to talk to, please call the Suicide and Crisis Lifeline: 988 call the [place] National Suicide Prevention Lifeline: 413-740-0636 or TTY: 631-052-2869 TTY 3140852901) to talk to a trained counselor call 1-800-273-TALK (toll free, 24 hour hotline)   Following is a copy of the CCM Program Consent:  CCM service includes personalized support from designated clinical staff supervised by the physician, including individualized plan of care and coordination with other care providers 24/7 contact phone numbers for assistance for urgent and routine care needs. Service will only be billed when office clinical staff spend 20 minutes or more in a month to coordinate care. Only one practitioner may furnish and bill the service in a calendar month. The patient may stop CCM services at amy time (effective at the end of the month) by phone call to the office staff. The patient will be responsible for cost sharing (co-pay) or up to 20% of the service fee (after annual deductible is met)  Following is a copy of your full provider care plan:   Goals Addressed             This Visit's Progress    CCM Expected Outcome:  Monitor, Self-Manage and Reduce Symptoms of: Anxiety       Current Barriers:  Care Coordination needs related to social worker support, social work referral pending  in a patient with anxiety Chronic Disease Management support and education needs related to effective management of anxiety  Planned Interventions: Evaluation of current treatment plan related to anxiety and patient's adherence to plan as established by provider Provided education to patient re: call the office for changes in mood, anxiety, depression, or mental health needs  Reviewed medications with patient and discussed compliance. The  patient is compliant with medications. Takes as prescribed Social Work referral for ongoing support and education for anxiety and mental health needs  Discussed plans with patient for ongoing care management follow up and provided patient with direct contact information for care management team Advised patient to discuss changes in his mood, anxiety, depression, or mental health  with provider Screening for signs and symptoms of depression related to chronic disease state  Assessed social determinant of health barriers  Symptom Management: Take medications as prescribed   Attend all scheduled provider appointments Call provider office for new concerns or questions  call the Suicide and Crisis Lifeline: 988 call the [place] National Suicide Prevention Lifeline: 646-487-2538 or TTY: (718)494-1463 TTY 601-629-6441) to talk to a trained counselor call 1-800-273-TALK (toll free, 24 hour hotline) if experiencing a Mental Health or [place]   Follow Up Plan: Telephone follow up appointment with care management team member scheduled for: 07-07-2022 at 145 pm       CCM Expected Outcome:  Monitor, Self-Manage and Reduce Symptoms of: Arthritis Spine, Back Pain       Current Barriers:  Care Coordination needs related to support and education for effective pain management  in a patient with Chronic pain and arthritis  Chronic Disease Management support and education needs related to effective management of chronic pain, arthritis, and scoliosis   Planned Interventions: Reviewed provider established plan for pain management. Currently the patient is stable with his pain management. He goes to the pain clinic but also is followed closely by pcp. The patients wife knows to call for  acute changes. The patient was sitting at the time of the call and rates his back pain at a 5 today. The patient walks with a walker and had a fall about 2 weeks ago; Discussed importance of adherence to all  scheduled medical appointments; Counseled on the importance of reporting any/all new or changed pain symptoms or management strategies to pain management provider; Advised patient to report to care team affect of pain on daily activities; Discussed use of relaxation techniques and/or diversional activities to assist with pain reduction (distraction, imagery, relaxation, massage, acupressure, TENS, heat, and cold application; Reviewed with patient prescribed pharmacological and nonpharmacological pain relief strategies; Advised patient to discuss changes in level or intensity of pain, unresolved pain, questions and concerns with provider; Screening for signs and symptoms of depression related to chronic disease state;  Assessed social determinant of health barriers;  Review of fall prevention and safety concerns  Symptom Management: Take medications as prescribed   Attend all scheduled provider appointments Call provider office for new concerns or questions  call the Suicide and Crisis Lifeline: 988 call the [place] National Suicide Prevention Lifeline: 316-257-9131 or TTY: 854-050-7511 TTY (250)161-2369) to talk to a trained counselor call 1-800-273-TALK (toll free, 24 hour hotline) if experiencing a Mental Health or [place]   Follow Up Plan: Telephone follow up appointment with care management team member scheduled for: 07-07-2022 at 145 pm       CCM Expected Outcome:  Monitor, Self-Manage and Reduce Symptoms of: Parkinson's       Current Barriers:  Knowledge Deficits related to resources in the area for support in meeting the needs of the patient as his Parkinson's advances and needs change  Care Coordination needs related to support from the CCM team, education needs, and ongoing support for effective management of Parkinson's and other chronic conditions in a patient with Parkinson's Disease  Chronic Disease Management support and education needs related to effective  management of PD  Planned Interventions: Evaluation of current treatment plan related to PD and patient's adherence to plan as established by provider Advised patient to provide appropriate vaccination information to provider or CM team member at next visit Advised patient to call the [facility] after talking to the insurance and discussing with her husband if they want the pcp to order the hospital bed.  Provided education to patient re: the CCM team, resources available, support for the patient, information about myChart: The patients wife wishes for the link to be sent to her email at [email or web address]. This has been sent today. The patients wife also advised the need to provide a DPR for the patient for the future of speaking to the team. The patient gave permission for the [facility] to speak to his wife [name].  Reviewed medications with patient and discussed compliance. The patient currently does not have a lot of medications and his wife states she is handling them well. Review of pharm D support if needed.  Collaborated with pcp and LCSW for current expressed needs  regarding PD and advancement of PD and possible future needs Provided patient with PD, falls and safety, and Water engineer related to resources to help with effective management of PD Social Work referral for education, support, and long term planning for the patient needs with the advancement of his PD Discussed plans with patient for ongoing care management follow up and provided patient with direct contact information for care management team Advised patient to discuss changes in PD, questions or concerns with  provider Screening for signs and symptoms of depression related to chronic disease state  Assessed social determinant of health barriers Encouraged the patients wife to get a notebook dedicated to the patients health needs and write down questions as she or the patient thinks of questions in regard to  DME needs, changes in conditions, medication questions, and other information important to meeting the needs of the patient.  The patient currently receives meals from [employer], has a tub bench and rails in the bathroom, frame for over the commode, and a ramp. The wife is going to call insurance about a hospital bed and let the RNCM know the desires of the patient.  Other resource that may be helpful for the patient is [facility] in [employer] at [phone].   Symptom Management: Take medications as prescribed   Attend all scheduled provider appointments Call provider office for new concerns or questions  call the Suicide and Crisis Lifeline: 988 call the [place] National Suicide Prevention Lifeline: 618-521-6710 or TTY: 984-216-6262 TTY 7170357287) to talk to a trained counselor call 1-800-273-TALK (toll free, 24 hour hotline) if experiencing a Mental Health or [place]   Follow Up Plan: Telephone follow up appointment with care management team member scheduled for: 07-07-2022 at 145 pm          The patient verbalized understanding of instructions, educational materials, and care plan provided today and agreed to receive a mailed copy of patient instructions, educational materials, and care plan.   Telephone follow up appointment with care management team member scheduled for: 07-07-2022 at 145 pm  Mindfulness-Based Stress Reduction Mindfulness-based stress reduction (MBSR) is a program that helps people learn to practice mindfulness. Mindfulness is the practice of consciously paying attention to the present moment. MBSR focuses on developing self-awareness, which lets you respond to life stress without judgment or negative feelings. It can be learned and practiced through techniques such as education, breathing exercises, meditation, and yoga. MBSR includes several mindfulness techniques in one program. MBSR works best when you understand the treatment, are  willing to try new things, and can commit to spending time practicing what you learn. MBSR training may include learning about: How your feelings, thoughts, and reactions affect your body. New ways to respond to things that cause negative thoughts to start (triggers). How to notice your thoughts and let go of them. Practicing awareness of everyday things that you normally do without thinking. The techniques and goals of different types of meditation. What are the benefits of MBSR? MBSR can have many benefits, which include helping you to: Develop self-awareness. This means knowing and understanding yourself. Learn skills and attitudes that help you to take part in your own health care. Learn new ways to care for yourself. Be more accepting about how things are, and let things go. Be less judgmental and approach things with an open mind. Be patient with yourself and trust yourself more. MBSR has also been shown to: Reduce negative emotions, such as sadness, overwhelm, and worry. Improve memory and focus. Change how you sense and react to pain. Boost your body's ability to fight infections. Help you connect better with other people. Improve your sense of well-being. How to practice mindfulness To do a basic awareness exercise: Find a comfortable place to sit. Pay attention to the present moment. Notice your thoughts, feelings, and surroundings just as they are. Avoid judging yourself, your feelings, or your surroundings. Make note of any judgment that comes up and let it go.  Your mind may wander, and that is okay. Make note of when your thoughts drift, and return your attention to the present moment. To do basic mindfulness meditation: Find a comfortable place to sit. This may include a stable chair or a firm floor cushion. Sit upright with your back straight. Let your arms fall next to your sides, with your hands resting on your legs. If you are sitting in a chair, rest your feet flat on  the floor. If you are sitting on a cushion, cross your legs in front of you. Keep your head in a neutral position with your chin dropped slightly. Relax your jaw and rest the tip of your tongue on the roof of your mouth. Drop your gaze to the floor or close your eyes. Breathe normally and pay attention to your breath. Feel the air moving in and out of your nose. Feel your belly expanding and relaxing with each breath. Your mind may wander, and that is okay. Make note of when your thoughts drift, and return your attention to your breath. Avoid judging yourself, your feelings, or your surroundings. Make note of any judgment or feelings that come up, let them go, and bring your attention back to your breath. When you are ready, lift your gaze or open your eyes. Pay attention to how your body feels after the meditation. Follow these instructions at home:  Find a local in-person or online MBSR program. Set aside some time regularly for mindfulness practice. Practice every day if you can. Even 10 minutes of practice is helpful. Find a mindfulness practice that works best for you. This may include one or more of the following: Meditation. This involves focusing your mind on a certain thought or activity. Breathing awareness exercises. These help you to stay present by focusing on your breath. Body scan. For this practice, you lie down and pay attention to each part of your body from head to toe. You can identify tension and soreness and consciously relax parts of your body. Yoga. Yoga involves stretching and breathing, and it can improve your ability to move and be flexible. It can also help you to test your body's limits, which can help you release stress. Mindful eating. This way of eating involves focusing on the taste, texture, color, and smell of each bite of food. This slows down eating and helps you feel full sooner. For this reason, it can be an important part of a weight loss plan. Find a podcast  or recording that provides guidance for breathing awareness, body scan, or meditation exercises. You can listen to these any time when you have a free moment to rest without distractions. Follow your treatment plan as told by your health care provider. This may include taking regular medicines and making changes to your diet or lifestyle as recommended. Where to find more information You can find more information about MBSR from: Your health care provider. Community-based meditation centers or programs. Programs offered near you. Summary Mindfulness-based stress reduction (MBSR) is a program that teaches you how to consciously pay attention to the present moment. It is used to help you deal better with daily stress, feelings, and pain. MBSR focuses on developing self-awareness, which allows you to respond to life stress without judgment or negative feelings. MBSR programs may involve learning different mindfulness practices, such as breathing exercises, meditation, yoga, body scan, or mindful eating. Find a mindfulness practice that works best for you, and set aside time for it on a regular basis. This  information is not intended to replace advice given to you by your health care provider. Make sure you discuss any questions you have with your health care provider. Document Revised: 09/26/2020 Document Reviewed: 09/26/2020 Elsevier Patient Education  [place] Disease Overview Identify signs, symptoms, and treatment options for Parkinson Disease. To view the content, go to this web address: [email or web address]  This video will expire on: 02/12/2024. If you need access to this video following this date, please reach out to the healthcare provider who assigned it to you. This information is not intended to replace advice given to you by your health care provider. Make sure you discuss any questions you have with your health care provider. Elsevier Patient Education   [place] Parkinson's disease is a movement disorder. It is a long-term condition that gets worse over time. Each person with Parkinson's disease is affected differently. This condition limits a person's ability to control movements and move the body normally. The condition can range from mild to severe. Parkinson's disease tends to get worse slowly over several years. What are the causes? Parkinson's disease is caused by a loss of brain cells (neurons) that make a brain chemical called dopamine. Dopamine is needed to control movement. As the condition gets worse, more neurons that make dopamine die. This makes it hard to move or control your movements. The exact cause of the loss of neurons is not known. Genes and the environment may contribute to the cause of Parkinson's disease. What increases the risk? The following factors may make you more likely to develop this condition: Being male. Being age 74 or older. Having a family history of Parkinson's disease. Having had a traumatic brain injury. Having been exposed to toxins, such as pesticides. Having depression. What are the signs or symptoms? Symptoms of this condition can vary. The main symptoms are related to movement. These include: A tremor or shaking while you are resting. You cannot control the shaking. Stiffness in your arms and legs (rigidity). Slowing of movement. You may lose facial expressions and have trouble making small movements that are needed to button clothing or brush your teeth. An abnormal walk. You may walk with short, shuffling steps. Loss of balance and stability when standing. You may sway, fall backward, and have trouble making turns. Other symptoms include: Mental or cognitive changes, including: Depression or anxiety. Having false beliefs (delusions). Seeing, hearing, or feeling things that do not exist (hallucinations). Trouble speaking or swallowing. Changes in bowel or  bladder functions, including constipation, having to go urgently or frequently, or not being able to control your bowel or bladder. Changes in sleep habits, acting out dreams, or trouble sleeping. Depending on the severity of the symptoms, Parkinson's disease may be mild, moderate, or advanced. Parkinson's disease progression is different for everyone. Some people may not progress to the advanced stage. Mild Parkinson's disease involves: Movement problems that do not affect daily activities. Movement problems on one side of the body. Moderate Parkinson's disease involves: Movement problems on both sides of the body. Slowing of movement. Coordination and balance problems. Advanced Parkinson's disease involves: Extreme difficulty walking. Inability to live alone safely. Signs of dementia, such as having trouble remembering things, doing daily tasks such as getting dressed, and problem solving. How is this diagnosed? This condition is diagnosed by a specialist. A diagnosis may be made based on symptoms, your medical history, and a physical exam. You may also have brain imaging tests to check for loss of  neurons in the brain. How is this treated? There is no cure for Parkinson's disease. Treatment focuses on managing your symptoms. Treatment may include: Medicines. Everyone responds to medicines differently. Your response may change over time. Work with your health care provider to find the best medicines for you. Speech, occupational, and physical therapy. Deep brain stimulation surgery to reduce tremors and other involuntary movements. Follow these instructions at home: Medicines Take over-the-counter and prescription medicines only as told by your health care provider. Avoid taking medicines that can affect thinking, such as pain or sleeping medicines. Eating and drinking Follow instructions from your health care provider about eating or drinking restrictions. Do not drink  alcohol. Activity Ask your health care provider if it is safe for you to drive. Do exercises as told by your health care provider or physical therapist. Lifestyle  Install grab bars and railings in your home to prevent falls. Do not use any products that contain nicotine or tobacco. These products include cigarettes, chewing tobacco, and vaping devices, such as e-cigarettes. If you need help quitting, ask your health care provider. Consider joining a support group for people with Parkinson's disease. General instructions Work with your health care provider to know the kind of day-to-day help that you may need and what to do to stay safe. Keep all follow-up visits. This is important. Follow-up visits include any visits with a physical therapist, speech therapist, or occupational therapist. Where to find more information [employer] of Neurological Disorders and Stroke: [employer] [place]: www.parkinson.org Contact a health care provider if: Medicines do not help your symptoms. You are unsteady or have fallen at home. You need more support to function well at home. You have trouble swallowing. You have severe constipation. You are having problems with side effects from your medicines. You feel confused, anxious, depressed, or have hallucinations. Get help right away if you: Are injured after a fall. Cannot swallow without choking. Have chest pain or trouble breathing. Do not feel safe at home. Have thoughts about hurting yourself or others. These symptoms may represent a serious problem that is an emergency. Do not wait to see if the symptoms will go away. Get medical help right away. Call your local emergency services (911 in the [place] Do not drive yourself to the hospital. If you ever feel like you may hurt yourself or others, or have thoughts about taking your own life, get help right away. Go to your nearest emergency department or: Call your local  emergency services (911 in the [place] Call a suicide crisis helpline, such as the [place] at 650-131-4551 or 988 in the [place]. This is open 24 hours a day in the [place] Text the Crisis Text Line at (401) 680-9027 (in the [place].). Summary Parkinson's disease is a long-term condition that gets worse over time. This condition limits your ability to control your movements and move your body normally. There is no cure for Parkinson's disease. Treatment focuses on managing your symptoms. Work with your health care provider to know the kind of day-to-day help that you may need and what to do to stay safe. Keep all follow-up visits, including any visits with a physical therapist, speech therapist, or occupational therapist. This is important. This information is not intended to replace advice given to you by your health care provider. Make sure you discuss any questions you have with your health care provider. Document Revised: 09/11/2020 Document Reviewed: 06/03/2020 Elsevier Patient Education  [place] for  Falls Millions of people have serious injuries from falls each year. It is important to understand your risk of falling. Talk with your health care provider about your risk and what you can do to lower it. If you do have a serious fall, make sure to tell your provider. Falling once raises your risk of falling again. How can falls affect me? Serious injuries from falls are common. These include: Broken bones, such as hip fractures. Head injuries, such as traumatic brain injuries (TBI) or concussions. A fear of falling can cause you to avoid activities and stay at home. This can make your muscles weaker and raise your risk for a fall. What can increase my risk? There are a number of risk factors that increase your risk for falling. The more risk factors you have, the higher your risk of falling. Serious injuries from a fall happen most often to people  who are older than 81 years old. Teenagers and young adults ages 82-29 are also at higher risk. Common risk factors include: Weakness in the lower body. Being generally weak or confused due to long-term (chronic) illness. Dizziness or balance problems. Poor vision. Medicines that cause dizziness or drowsiness. These may include: Medicines for your blood pressure, heart, anxiety, insomnia, or swelling (edema). Pain medicines. Muscle relaxants. Other risk factors include: Drinking alcohol. Having had a fall in the past. Having foot pain or wearing improper footwear. Working at a dangerous job. Having any of the following in your home: Tripping hazards, such as floor clutter or loose rugs. Poor lighting. Pets. Having dementia or memory loss. What actions can I take to lower my risk of falling?     Physical activity Stay physically fit. Do strength and balance exercises. Consider taking a regular class to build strength and balance. Yoga and tai chi are good options. Vision Have your eyes checked every year and your prescription for glasses or contacts updated as needed. Shoes and walking aids Wear non-skid shoes. Wear shoes that have rubber soles and low heels. Do not wear high heels. Do not walk around the house in socks or slippers. Use a cane or walker as told by your provider. Home safety Attach secure railings on both sides of your stairs. Install grab bars for your bathtub, shower, and toilet. Use a non-skid mat in your bathtub or shower. Attach bath mats securely with double-sided, non-slip rug tape. Use good lighting in all rooms. Keep a flashlight near your bed. Make sure there is a clear path from your bed to the bathroom. Use night-lights. Do not use throw rugs. Make sure all carpeting is taped or tacked down securely. Remove all clutter from walkways and stairways, including extension cords. Repair uneven or broken steps and floors. Avoid walking on icy or slippery  surfaces. Walk on the grass instead of on icy or slick sidewalks. Use ice melter to get rid of ice on walkways in the winter. Use a cordless phone. Questions to ask your health care provider Can you help me check my risk for a fall? Do any of my medicines make me more likely to fall? Should I take a vitamin D supplement? What exercises can I do to improve my strength and balance? Should I make an appointment to have my vision checked? Do I need a bone density test to check for weak bones (osteoporosis)? Would it help to use a cane or a walker? Where to find more information Centers for Disease Control and Prevention, [place]: StoreMirror.com.cy Community-Based Fall Prevention  Programs: [email or web address].cy [employer] on Aging: [email or web address] Contact a health care provider if: You fall at home. You are afraid of falling at home. You feel weak, drowsy, or dizzy. This information is not intended to replace advice given to you by your health care provider. Make sure you discuss any questions you have with your health care provider. Document Revised: 10/20/2021 Document Reviewed: 10/20/2021 Elsevier Patient Education  [place].

## 2022-06-03 NOTE — Plan of Care (Signed)
Chronic Care Management Provider Comprehensive Care Plan    06/03/2022 Name: Joseph Arroyo MRN: [id number] DOB: 1940-11-20  Referral to Chronic Care Management (CCM) services was placed by Provider:  Dr. Nobie Putnam on Date: 05-26-2022.  Chronic Condition 1: Parkinson's Disease  Provider Assessment and Plan  oxyCODONE (OXY IR/ROXICODONE) 5 MG immediate release tablet     Other Relevant Orders    COMPLETE METABOLIC PANEL WITH GFR    AMB Referral to Chronic Care Management Services  Managed by Neurology on medication and PT Continues to experience gradual disease progression Impacting his daily function ADLs, mobility and other function He will benefit from hospital bed DME The patient requires a hospital bed because he/she requires positioning of the body in ways not feasible with an ordinary bed or to relieve pain. The patient requires a regular hospital bed. A gel overlay is required as the patient has limited mobility AND the patient has an impaired nutritional status.   Referral to Chronic Care Management today RN + LCSW to assist with DME / coordinating future care, home health, home aide / future [place] placement if indicated.   Will need info on preferred DME location when ordering hospital bed.   Future consider referral to Palliative Care for further medication management assistance and symptom visits as needed.        Expected Outcome/Goals Addressed This Visit (Provider CCM goals/Provider Assessment and plan   CCM Expected Outcome:  Monitor, Self-Manage and Reduce Symptoms of Parkinson's Disease  Symptom Management Condition 1: Take all medications as prescribed Attend all scheduled provider appointments Call provider office for new concerns or questions  call the Suicide and Crisis Lifeline: 988 call the [place] National Suicide Prevention Lifeline: 726-560-1612 or TTY: 330-177-3602 TTY (939)011-4245) to talk to a trained counselor call  1-800-273-TALK (toll free, 24 hour hotline) if experiencing a Mental Health or [place] Crisis   Chronic Condition 2: Anxiety Provider Assessment and Plan AMB Referral to Chronic Care Management Services    Expected Outcome/Goals Addressed This Visit (Provider CCM goals/Provider Assessment and plan   CCM Expected Outcome:  Monitor, Self-Manage and Reduce Symptoms of anxiety   Symptom Management Condition 2: Take all medications as prescribed Attend all scheduled provider appointments Call provider office for new concerns or questions  call the Suicide and Crisis Lifeline: 988 call the [place] National Suicide Prevention Lifeline: 646-069-6353 or TTY: 432-777-8923 [place] (936)580-5259) to talk to a trained counselor call 1-800-273-TALK (toll free, 24 hour hotline) if experiencing a Mental Health or [place] Crisis   Chronic Condition 3: Chronic pain, scoliosis, arthritis Provider Assessment and Plan  Relevant Medications     oxyCODONE (OXY IR/ROXICODONE) 5 MG immediate release tablet    Other Relevant Orders    AMB Referral to Chronic Care Management Services  Per his [facility] Management specialist they are requesting that his PCP manage his opiate therapy for his pain and function.   Will initiate opioid therapy to assist his patient's function. We discussed at length today about the risk and benefit of opioid therapy, from dependence to addiction and side effects.   Ordered Oxycodone IR 5mg  as needed up to 3 times a day for pain. Will order 45 pill count today first and can adjust according to usage. They plan to not use regularly initially.   Okay to keep taking Tylenol Extra Strength 500mg  x 2 = 1000mg   up to 3 times a day as needed   Check CMET lab today  for his Liver function and nutritional status / creatinine.   Ibuprofen as needed       Expected Outcome/Goals Addressed This Visit (Provider CCM goals/Provider Assessment and plan   CCM (chronic pain,  scoliosis, arthritis)  EXPECTED OUTCOME:  MONITOR, SELF-MANAGE AND REDUCE SYMPTOMS OF chronic pain, scoliosis, arthritis  Symptom Management Condition 3: Take all medications as prescribed Attend all scheduled provider appointments Call provider office for new concerns or questions  call the Suicide and Crisis Lifeline: 988 call the [place] National Suicide Prevention Lifeline: 620-173-3120 or TTY: (440)280-0743 TTY (551) 202-4221) to talk to a trained counselor call 1-800-273-TALK (toll free, 24 hour hotline) if experiencing a Mental Health or [place] Crisis   Problem List Patient Active Problem List   Diagnosis Date Noted   Parkinson's disease with dyskinesia 01/27/2022   Dysphagia, pharyngeal phase 09/17/2021   Cervicalgia 08/05/2021   Spondylosis without myelopathy or radiculopathy, lumbosacral region 06/24/2021   Lumbar central spinal stenosis, w/o neurogenic claudication (L4-5) 06/03/2021   Lumbar lateral recess stenosis (Bilateral: L2-3, L4-5) (Right: L3-4) 06/03/2021   Lumbosacral foraminal stenosis (Bilateral: L2-3, L3-4) (Left: L5-S1) 06/03/2021   Lumbar nerve root impingement (Right: L3 at L2-3 & L3-4) 06/03/2021   Ligamentum flavum hypertrophy (L3-4, L4-5) 06/03/2021   Abnormal MRI, lumbar spine (04/23/2021) 04/24/2021   Long term prescription benzodiazepine use (alprazolam) (Xanax) 03/24/2021   Grade 1 Retrolisthesis of L2/L3 (5 mm) and L3/L4 (3 mm) 03/24/2021   Levoscoliosis of lumbar spine (L3-4 apex) 03/24/2021   DDD (degenerative disc disease), lumbosacral 03/24/2021   Lumbosacral facet arthropathy (Left: L3-4, L4-5, and L5-S1) 03/24/2021   Tricompartment osteoarthritis of knee (Left) 03/24/2021   Baker cyst (Left) 03/24/2021   Chronic low back pain (1ry area of Pain) (Bilateral) (R>L) w/o sciatica 03/24/2021   Lumbar facet syndrome (Bilateral) 03/24/2021   Chronic lower extremity pain (2ry area of Pain) (Bilateral) (L>R) 03/24/2021   Lumbosacral  radiculitis/sensory radiculopathy at L2 (Bilateral) 03/24/2021   Lumbosacral radiculitis/sensory radiculopathy at L3 (Bilateral) 03/24/2021   Chronic pain syndrome 03/23/2021   Pharmacologic therapy 03/23/2021   Disorder of skeletal system 03/23/2021   Problems influencing health status 03/23/2021   PAD (peripheral artery disease) 10/01/2020   GAD (generalized anxiety disorder) 01/13/2018   MDD (major depressive disorder) 99991111   Umbilical hernia 0000000   Chronic knee pain (Left) 08/11/2017   Derangement of medial meniscus, posterior horn (Left) 08/11/2017   Vitamin D insufficiency 03/05/2016   BPH without obstruction/lower urinary tract symptoms 07/08/2015   Chronic fatigue 10/23/2014   Major depression, recurrent, full remission ([place]) 07/26/2013   Unsteady gait 07/26/2013   Orthostatic hypotension 07/26/2013   Depression 07/26/2013   Anxiety 06/23/2013   Chronic anxiety 06/23/2013   Tremor 05/18/2013   Bilateral carotid artery stenosis 08/30/2012    Medication Management  Current Outpatient Medications:    acetaminophen (TYLENOL) 500 MG tablet, Take 1,000 mg by mouth every 8 (eight) hours as needed., Disp: , Rfl:    ALPRAZolam (XANAX) 0.25 MG tablet, Take 1 tablet (0.25 mg total) by mouth 3 (three) times daily as needed for anxiety., Disp: 90 tablet, Rfl: 1   B Complex Vitamins (VITAMIN B COMPLEX PO), Take 1 tablet by mouth daily., Disp: , Rfl:    baclofen (LIORESAL) 10 MG tablet, Take 0.5-1 tablets (5-10 mg total) by mouth 3 (three) times daily as needed for muscle spasms., Disp: 90 each, Rfl: 1   Capsicum-Garlic [id number] MG CAPS, Take 200-300 mg by mouth., Disp: , Rfl:    carbidopa-levodopa (SINEMET  IR) 25-100 MG tablet, Take 2.5 tablets by mouth 3 (three) times daily., Disp: , Rfl:    cloNIDine (CATAPRES) 0.1 MG tablet, TAKE 1/2 TABLET BY MOUTH TWICE DAILY, Disp: 90 tablet, Rfl: 1   gabapentin (NEURONTIN) 100 MG capsule, 1 in AM and noon and 2 at night, Disp: 360  capsule, Rfl: 1   HAWTHORNE BERRY PO, Take by mouth daily., Disp: , Rfl:    L-Methylfolate 15 MG TABS, Take 1 tablet (15 mg total) by mouth daily., Disp: 90 tablet, Rfl: 3   Lutein 10 MG TABS, Take 1 tablet by mouth 3 (three) times daily. 1 daily, Disp: , Rfl:    oxyCODONE (OXY IR/ROXICODONE) 5 MG immediate release tablet, Take 1 tablet (5 mg total) by mouth 3 (three) times daily as needed for severe pain., Disp: 45 tablet, Rfl: 0   PARoxetine (PAXIL) 20 MG tablet, TAKE 1 TABLET BY MOUTH EVERY DAY (WITH THE 40 MG TABLET FOR 60 MG TOTAL), Disp: 90 tablet, Rfl: 1   PARoxetine (PAXIL) 40 MG tablet, TAKE 1 TABLET BY MOUTH EVERY DAY (WITH THE 20 MG TABLET FOR 60 MG TOTAL), Disp: 90 tablet, Rfl: 1   Saw [place]metto, Serenoa repens, 1000 MG CAPS, Take 2 capsules by mouth daily., Disp: , Rfl:   Cognitive Assessment Identity Confirmed: : Name; DOB Cognitive Status: Normal   Functional Assessment Hearing Difficulty or Deaf: yes Concentrating, Remembering or Making Decisions Difficulty (CP): yes Difficulty Communicating: no Difficulty Eating/Swallowing: no Eating/Swallowing Management: eats slower, wife does cut some pills in half and puts in pudding Walking or Climbing Stairs Difficulty: yes Walking or Climbing Stairs: ambulation difficulty, requires equipment Mobility Management: walks with a walker, has freezing periods, moving slower Dressing/Bathing Difficulty: yes Dressing/Bathing: bathing difficulty, assistance 1 person Dressing/Bathing Management: wife assist with bathing and dressing Doing Errands Independently Difficulty (such as shopping) (CP): yes Errands Management: Wife drives him and helps him meet his needs, the patient hasn't driven in a while, has adequate transportation   Caregiver Assessment  Primary Source of Support/Comfort: spouse Name of Support/Comfort Primary Source: Traver Fazzini- wife People in Home: spouse Family Caregiver if Needed: spouse Family Caregiver Names:  [name] his wife Primary Roles/Responsibilities: retired Concerns About Impact on Relationships: concerns as his PD is progressing   Planned Interventions  Evaluation of current treatment plan related to PD and patient's adherence to plan as established by provider Advised patient to provide appropriate vaccination information to provider or CM team member at next visit Advised patient to call the [facility] after talking to the insurance and discussing with her husband if they want the pcp to order the hospital bed.  Provided education to patient re: the CCM team, resources available, support for the patient, information about myChart: The patients wife wishes for the link to be sent to her email at [email or web address]. This has been sent today. The patients wife also advised the need to provide a DPR for the patient for the future of speaking to the team. The patient gave permission for the [facility] to speak to his wife [name].  Reviewed medications with patient and discussed compliance. The patient currently does not have a lot of medications and his wife states she is handling them well. Review of pharm D support if needed.  Collaborated with pcp and LCSW for current expressed needs  regarding PD and advancement of PD and possible future needs Provided patient with PD, falls and safety, and community resource educational materials related to resources to help with effective management  of PD Social Work referral for education, support, and long term planning for the patient needs with the advancement of his PD Discussed plans with patient for ongoing care management follow up and provided patient with direct contact information for care management team Advised patient to discuss changes in PD, questions or concerns with provider Screening for signs and symptoms of depression related to chronic disease state  Assessed social determinant of health barriers Encouraged the patients wife to get a notebook  dedicated to the patients health needs and write down questions as she or the patient thinks of questions in regard to DME needs, changes in conditions, medication questions, and other information important to meeting the needs of the patient.  The patient currently receives meals from [employer], has a tub bench and rails in the bathroom, frame for over the commode, and a ramp. The wife is going to call insurance about a hospital bed and let the RNCM know the desires of the patient.  Other resource that may be helpful for the patient is [facility] in [place] at [phone].  Reviewed provider established plan for pain management. Currently the patient is stable with his pain management. He goes to the pain clinic but also is followed closely by pcp. The patients wife knows to call for acute changes. The patient was sitting at the time of the call and rates his back pain at a 5 today. The patient walks with a walker and had a fall about 2 weeks ago; Discussed importance of adherence to all scheduled medical appointments; Counseled on the importance of reporting any/all new or changed pain symptoms or management strategies to pain management provider; Advised patient to report to care team affect of pain on daily activities; Discussed use of relaxation techniques and/or diversional activities to assist with pain reduction (distraction, imagery, relaxation, massage, acupressure, TENS, heat, and cold application; Reviewed with patient prescribed pharmacological and nonpharmacological pain relief strategies; Advised patient to discuss changes in level or intensity of pain, unresolved pain, questions and concerns with provider; Screening for signs and symptoms of depression related to chronic disease state;  Assessed social determinant of health barriers;  Review of fall prevention and safety concerns Evaluation of current treatment plan related to anxiety and patient's adherence to plan as  established by provider Provided education to patient re: call the office for changes in mood, anxiety, depression, or mental health needs  Reviewed medications with patient and discussed compliance. The patient is compliant with medications. Takes as prescribed Social Work referral for ongoing support and education for anxiety and mental health needs  Discussed plans with patient for ongoing care management follow up and provided patient with direct contact information for care management team Advised patient to discuss changes in his mood, anxiety, depression, or mental health  with provider Screening for signs and symptoms of depression related to chronic disease state  Assessed social determinant of health barriers     Interaction and coordination with outside resources, practitioners, and providers See CCM Referral  Care Plan: Printed and mailed to patient

## 2022-06-04 ENCOUNTER — Ambulatory Visit: Payer: Medicare HMO | Admitting: Physical Therapy

## 2022-06-08 ENCOUNTER — Ambulatory Visit: Payer: Self-pay | Admitting: *Deleted

## 2022-06-08 ENCOUNTER — Encounter: Payer: Medicare HMO | Admitting: Licensed Clinical Social Worker

## 2022-06-09 ENCOUNTER — Ambulatory Visit: Payer: Medicare HMO | Admitting: Physical Therapy

## 2022-06-09 NOTE — Patient Instructions (Signed)
Visit Information  Thank you for taking time to visit with me today. Please don't hesitate to contact me if I can be of assistance to you.   Following are the goals we discussed today:   Goals Addressed             This Visit's Progress    Community resources       Interventions Today    Flowsheet Row Most Recent Value  Chronic Disease   Chronic disease during today's visit Other  ['s disease with dyskinesia]  General Interventions   General Interventions Discussed/Reviewed General Interventions Discussed, Chief Strategy Officer resources related to support groups and Palliative Care support provided]  Education Interventions   Education Provided Provided Sales executive care information provided]  Mental Health Interventions   Mental Health Discussed/Reviewed Mental Health Discussed  [patient followed by Dr. Jennelle Human at [facility]]  Advanced Directive Interventions   Advanced Directives Discussed/Reviewed Advanced Directives Discussed  [Advanced Care Planning information mailed to patient's home]               If you are experiencing a Mental Health or Behavioral Health Crisis or need someone to talk to, please call 911   Patient verbalizes understanding of instructions and care plan provided today and agrees to view in MyChart. Active MyChart status and patient understanding of how to access instructions and care plan via MyChart confirmed with patient.     No further follow up required: patient's spouse to contact this Child psychotherapist with any additional community resources needs.  Verna Czech, LCSW Clinical Social Worker  [facility] Care Management [phone]

## 2022-06-09 NOTE — Patient Outreach (Signed)
  Care Coordination   Initial Visit Note   06/09/2022 Late Entry Name: Joseph Arroyo MRN: [id number] DOB: 1940/11/10  Joseph Arroyo is a 82 y.o. year old male who sees Smitty Cords, DO for primary care. I spoke with  Joseph Arroyo's spouse by phone today.  What matters to the patients health and wellness today?  Patient's spouse requesting available resources to assist with patient's care. Patient's spouse is patient's main caregiver. [facility] community resource guide provided, as well as information on Palliative Care, support groups and Advanced Care Planning    Goals Addressed             This Visit's Progress    Community resources       Interventions Today    Flowsheet Row Most Recent Value  Chronic Disease   Chronic disease during today's visit Other  ['s disease with dyskinesia]  General Interventions   General Interventions Discussed/Reviewed General Interventions Discussed, Chief Strategy Officer resources related to support groups and Palliative Care support provided]  Education Interventions   Education Provided Provided Sales executive care information provided]  Mental Health Interventions   Mental Health Discussed/Reviewed Mental Health Discussed  [patient followed by Dr. Jennelle Human at [facility]]  Advanced Directive Interventions   Advanced Directives Discussed/Reviewed Advanced Directives Discussed  [Advanced Care Planning information mailed to patient's home]              SDOH assessments and interventions completed:  Yes  SDOH Interventions Today    Flowsheet Row Most Recent Value  SDOH Interventions   Food Insecurity Interventions Intervention Not Indicated  Housing Interventions Intervention Not Indicated  Transportation Interventions Intervention Not Indicated        Care Coordination Interventions:  Yes, provided   Follow up plan: No further intervention required.  Patient's spouse will contact this Child psychotherapist with any additional questions regarding resources provided.  Encounter Outcome:  Pt. Visit Completed

## 2022-06-10 ENCOUNTER — Telehealth: Payer: Self-pay

## 2022-06-10 NOTE — Telephone Encounter (Signed)
I scheduled his RFA for 06/25/22. His wife wants a nurse to call her to go over all the instructions and what to expect afterwards. She is leaving now, so its ok to call her tomorrow.

## 2022-06-11 ENCOUNTER — Ambulatory Visit: Payer: Medicare HMO | Admitting: Physical Therapy

## 2022-06-11 NOTE — Telephone Encounter (Signed)
Talked with wife and went over pre procedure instructions, also review medication for post procedure. Instructed that question can also be asked procedure day and that we would give her instructions.

## 2022-06-16 ENCOUNTER — Ambulatory Visit: Payer: Medicare HMO | Admitting: Physical Therapy

## 2022-06-18 ENCOUNTER — Ambulatory Visit: Payer: Medicare HMO | Admitting: Physical Therapy

## 2022-06-22 ENCOUNTER — Telehealth: Payer: Self-pay | Admitting: *Deleted

## 2022-06-22 NOTE — Patient Instructions (Signed)
Visit Information  Thank you for taking time to visit with me today. Please don't hesitate to contact me if I can be of assistance to you.   Following are the goals we discussed today:   Goals Addressed             This Visit's Progress    community resource needs       Care Coordination Interventions: Confirmed with patient's spouse that she received the packet of resources mailed to her home related to area support groups, palliative care and general community resources Patient's spouse will review information received and will call this Child psychotherapist with any additional questions or community resource needs       COMPLETED: Community resources       Interventions Today    Flowsheet Row Most Recent Value  Chronic Disease   Chronic disease during today's visit Other  ['s disease with dyskinesia]  General Interventions   General Interventions Discussed/Reviewed General Interventions Discussed, Chief Strategy Officer resources related to support groups and Palliative Care support provided]  Education Interventions   Education Provided Provided Sales executive care information provided]  Mental Health Interventions   Mental Health Discussed/Reviewed Mental Health Discussed  [patient followed by Dr. Jennelle Human at [facility]]  Advanced Directive Interventions   Advanced Directives Discussed/Reviewed Advanced Directives Discussed  [Advanced Care Planning information mailed to patient's home]              OIf you are experiencing a Mental Health or Behavioral Health Crisis or need someone to talk to, please call 911   Patient verbalizes understanding of instructions and care plan provided today and agrees to view in MyChart. Active MyChart status and patient understanding of how to access instructions and care plan via MyChart confirmed with patient.     No further follow up required: patient's spouse to contact this Child psychotherapist with questions  once community resource packet is recieved  [employer], LCSW Clinical Social Worker  [facility] Care Management [phone]

## 2022-06-22 NOTE — Patient Outreach (Signed)
  Care Coordination   Follow Up Visit Note   06/22/2022 Name: Joseph Arroyo MRN: [id number] DOB: 1940/08/21  Joseph Arroyo is a 82 y.o. year old male who sees Smitty Cords, DO for primary care. I spoke with  Joseph Arroyo by phone today.  What matters to the patients health and wellness today?  Community resources    Goals Addressed             This Visit's Progress    community resource needs       Care Coordination Interventions: Confirmed with patient's spouse that she received the packet of resources mailed to her home related to area support groups, palliative care and general community resources Patient's spouse will review information received and will call this Child psychotherapist with any additional questions or community resource needs       COMPLETED: Community resources       Interventions Today    Flowsheet Row Most Recent Value  Chronic Disease   Chronic disease during today's visit Other  ['s disease with dyskinesia]  General Interventions   General Interventions Discussed/Reviewed General Interventions Discussed, Chief Strategy Officer resources related to support groups and Palliative Care support provided]  Education Interventions   Education Provided Provided Sales executive care information provided]  Mental Health Interventions   Mental Health Discussed/Reviewed Mental Health Discussed  [patient followed by Dr. Jennelle Human at [facility]]  Advanced Directive Interventions   Advanced Directives Discussed/Reviewed Advanced Directives Discussed  [Advanced Care Planning information mailed to patient's home]              SDOH assessments and interventions completed:  No     Care Coordination Interventions:  Yes, provided  Interventions Today    Flowsheet Row Most Recent Value  Chronic Disease   Chronic disease during today's visit Other  [parkinson's disease with dyskinesia]  General  Interventions   General Interventions Discussed/Reviewed [employer]  [confirmed that patient's spouse has received resources provided-will call this Child psychotherapist with questions once resources are reviewed]       Follow up plan: No further intervention required.   Encounter Outcome:  Pt. Visit Completed

## 2022-06-23 ENCOUNTER — Ambulatory Visit: Payer: Medicare HMO | Admitting: Physical Therapy

## 2022-06-24 NOTE — Progress Notes (Addendum)
PROVIDER NOTE: Interpretation of information contained herein should be left to medically-trained personnel. Specific patient instructions are provided elsewhere under "Patient Instructions" section of medical record. This document was created in part using STT-dictation technology, any transcriptional errors that may result from this process are unintentional.  Patient: Joseph Arroyo Type: Established DOB: 1940-03-20 MRN: [id number] PCP: Smitty Cords, DO  Service: Procedure DOS: 06/25/2022 Setting: Ambulatory Location: Ambulatory outpatient facility Delivery: Face-to-face Provider: Oswaldo Done, MD Specialty: Interventional Pain Management Specialty designation: 09 Location: Outpatient facility Ref. Prov.: Delano Metz, MD       Interventional Therapy   Procedure: Lumbar Facet, Medial Branch Radiofrequency Ablation (RFA) #1  Laterality: Right (-RT)  Level:  L1, L2, L3, and L4 Medial Branch Level(s). These levels will denervate the  L2-3 and L3-4 lumbar facet joints.  Imaging: Fluoroscopy-guided         Anesthesia: Local anesthesia (1-2% Lidocaine) Anxiolysis: IV Versed 1.5 mg Sedation: Moderate Sedation Fentanyl 0.5 mL (25 mcg) DOS: 06/25/2022  Performed by: Oswaldo Done, MD  Purpose: Therapeutic/Palliative Indications: Low back pain severe enough to impact quality of life or function. Indications: 1. Lumbar facet syndrome (Bilateral)   2. Spondylosis without myelopathy or radiculopathy, lumbosacral region   3. Grade 1 Retrolisthesis of L2/L3 (5 mm) and L3/L4 (3 mm)   4. Lumbar facet joint pain   5. Osteoarthritis of facet joint of lumbar spine   6. Lumbosacral facet joint hypertrophy   7. Lumbosacral facet arthropathy (Left: L3-4, L4-5, and L5-S1)   8. Chronic low back pain (1ry area of Pain) (Bilateral) (R>L) w/o sciatica   9. DDD (degenerative disc disease), lumbosacral   10. Abnormal MRI, lumbar spine (04/23/2021)    Joseph Arroyo has  been dealing with the above chronic pain for longer than three months and has either failed to respond, was unable to tolerate, or simply did not get enough benefit from other more conservative therapies including, but not limited to: 1. Over-the-counter medications 2. Anti-inflammatory medications 3. Muscle relaxants 4. Membrane stabilizers 5. Opioids 6. Physical therapy and/or chiropractic manipulation 7. Modalities (Heat, ice, etc.) 8. Invasive techniques such as nerve blocks. Joseph Arroyo has attained more than 50% relief of the pain from a series of diagnostic injections conducted in separate occasions.  Pain Score: Pre-procedure: 7 /10 Post-procedure: 0-No pain/10     Position / Prep / Materials:  Position: Prone  Prep solution: DuraPrep (Iodine Povacrylex [0.7% available iodine] and Isopropyl Alcohol, 74% w/w) Prep Area: Entire Lumbosacral Region (Lower back from mid-thoracic region to end of tailbone and from flank to flank.) Materials:  Tray: RFA (Radiofrequency) tray Needle(s):  Type: RFA (Teflon-coated radiofrequency ablation needles) Gauge (G): 22  Length: Regular (10cm) Qty: 4      Pre-op H&P Assessment:  Joseph Arroyo is a 82 y.o. (year old), male patient, seen today for interventional treatment. He  has a past surgical history that includes Other surgical history (2002); Colonoscopy (2008); Stapedes surgery (Right, 2002); Cataract extraction (Right, 07/18/14); and Cataract extraction w/PHACO (Left, 08/01/2014). Joseph Arroyo has a current medication list which includes the following prescription(s): acetaminophen, alprazolam, b complex vitamins, baclofen, capsicum-garlic, carbidopa-levodopa, clonidine, gabapentin, hawthorn, hydrocodone-acetaminophen, [START ON 07/02/2022] hydrocodone-acetaminophen, l-methylfolate, lutein, oxycodone, paroxetine, paroxetine, and saw palmetto (serenoa repens), and the following Facility-Administered Medications: fentanyl. His primarily concern  today is the Back Pain (Right, lower)  Initial Vital Signs:  Pulse/HCG Rate: (!) 54ECG Heart Rate: (!) 58 (SB) Temp: 98.2 F (36.8 C) Resp: 16 BP: 108/80 SpO2: 98 %  BMI: Estimated body mass index is 20.98 kg/m as calculated from the following:   Height as of this encounter: 5\' 6"  (1.676 m).   Weight as of this encounter: 130 lb (59 kg).  Risk Assessment: Allergies: Reviewed. He is allergic to prednisone and wheat.  Allergy Precautions: None required Coagulopathies: Reviewed. None identified.  Blood-thinner therapy: None at this time Active Infection(s): Reviewed. None identified. Joseph Arroyo is afebrile  Site Confirmation: Joseph Arroyo was asked to confirm the procedure and laterality before marking the site Procedure checklist: Completed Consent: Before the procedure and under the influence of no sedative(s), amnesic(s), or anxiolytics, the patient was informed of the treatment options, risks and possible complications. To fulfill our ethical and legal obligations, as recommended by the American Medical Association's Code of Ethics, I have informed the patient of my clinical impression; the nature and purpose of the treatment or procedure; the risks, benefits, and possible complications of the intervention; the alternatives, including doing nothing; the risk(s) and benefit(s) of the alternative treatment(s) or procedure(s); and the risk(s) and benefit(s) of doing nothing. The patient was provided information about the general risks and possible complications associated with the procedure. These may include, but are not limited to: failure to achieve desired goals, infection, bleeding, organ or nerve damage, allergic reactions, paralysis, and death. In addition, the patient was informed of those risks and complications associated to Spine-related procedures, such as failure to decrease pain; infection (i.e.: Meningitis, epidural or intraspinal abscess); bleeding (i.e.: epidural  hematoma, subarachnoid hemorrhage, or any other type of intraspinal or peri-dural bleeding); organ or nerve damage (i.e.: Any type of peripheral nerve, nerve root, or spinal cord injury) with subsequent damage to sensory, motor, and/or autonomic systems, resulting in permanent pain, numbness, and/or weakness of one or several areas of the body; allergic reactions; (i.e.: anaphylactic reaction); and/or death. Furthermore, the patient was informed of those risks and complications associated with the medications. These include, but are not limited to: allergic reactions (i.e.: anaphylactic or anaphylactoid reaction(s)); adrenal axis suppression; blood sugar elevation that in diabetics may result in ketoacidosis or comma; water retention that in patients with history of congestive heart failure may result in shortness of breath, pulmonary edema, and decompensation with resultant heart failure; weight gain; swelling or edema; medication-induced neural toxicity; particulate matter embolism and blood vessel occlusion with resultant organ, and/or nervous system infarction; and/or aseptic necrosis of one or more joints. Finally, the patient was informed that Medicine is not an exact science; therefore, there is also the possibility of unforeseen or unpredictable risks and/or possible complications that may result in a catastrophic outcome. The patient indicated having understood very clearly. We have given the patient no guarantees and we have made no promises. Enough time was given to the patient to ask questions, all of which were answered to the patient's satisfaction. Mr. Aloisi has indicated that he wanted to continue with the procedure. Attestation: I, the ordering provider, attest that I have discussed with the patient the benefits, risks, side-effects, alternatives, likelihood of achieving goals, and potential problems during recovery for the procedure that I have provided informed consent. Date  Time:  06/25/2022  8:04 AM   Pre-Procedure Preparation:  Monitoring: As per clinic protocol. Respiration, ETCO2, SpO2, BP, heart rate and rhythm monitor placed and checked for adequate function Safety Precautions: Patient was assessed for positional comfort and pressure points before starting the procedure. Time-out: I initiated and conducted the "Time-out" before starting the procedure, as per protocol. The patient was asked  to participate by confirming the accuracy of the "Time Out" information. Verification of the correct person, site, and procedure were performed and confirmed by me, the nursing staff, and the patient. "Time-out" conducted as per Joint Commission's Universal Protocol (UP.01.01.01). Time: 0851 Start Time: 0851 hrs.  Description of Procedure:          Laterality: See above. Levels:  See above. Safety Precautions: Aspiration looking for blood return was conducted prior to all injections. At no point did we inject any substances, as a needle was being advanced. Before injecting, the patient was told to immediately notify me if he was experiencing any new onset of "ringing in the ears, or metallic taste in the mouth". No attempts were made at seeking any paresthesias. Safe injection practices and needle disposal techniques used. Medications properly checked for expiration dates. SDV (single dose vial) medications used. After the completion of the procedure, all disposable equipment used was discarded in the proper designated medical waste containers. Local Anesthesia: Protocol guidelines were followed. The patient was positioned over the fluoroscopy table. The area was prepped in the usual manner. The time-out was completed. The target area was identified using fluoroscopy. A 12-in long, straight, sterile hemostat was used with fluoroscopic guidance to locate the targets for each level blocked. Once located, the skin was marked with an approved surgical skin marker. Once all sites were marked,  the skin (epidermis, dermis, and hypodermis), as well as deeper tissues (fat, connective tissue and muscle) were infiltrated with a small amount of a short-acting local anesthetic, loaded on a 10cc syringe with a 25G, 1.5-in  Needle. An appropriate amount of time was allowed for local anesthetics to take effect before proceeding to the next step. Technical description of process:  Radiofrequency Ablation (RFA)  L1 Medial Branch Nerve RFA: The target area for the L1 medial branch is at the junction of the postero-lateral aspect of the superior articular process and the superior, posterior, and medial edge of the transverse process of L2. Under fluoroscopic guidance, a Radiofrequency needle was inserted until contact was made with os over the superior postero-lateral aspect of the pedicular shadow (target area). Sensory and motor testing was conducted to properly adjust the position of the needle. Once satisfactory placement of the needle was achieved, the numbing solution was slowly injected after negative aspiration for blood. 2.0 mL of the nerve block solution was injected without difficulty or complication. After waiting for at least 3 minutes, the ablation was performed. Once completed, the needle was removed intact. L2 Medial Branch Nerve RFA: The target area for the L2 medial branch is at the junction of the postero-lateral aspect of the superior articular process and the superior, posterior, and medial edge of the transverse process of L3. Under fluoroscopic guidance, a Radiofrequency needle was inserted until contact was made with os over the superior postero-lateral aspect of the pedicular shadow (target area). Sensory and motor testing was conducted to properly adjust the position of the needle. Once satisfactory placement of the needle was achieved, the numbing solution was slowly injected after negative aspiration for blood. 2.0 mL of the nerve block solution was injected without difficulty or  complication. After waiting for at least 3 minutes, the ablation was performed. Once completed, the needle was removed intact. L3 Medial Branch Nerve RFA: The target area for the L3 medial branch is at the junction of the postero-lateral aspect of the superior articular process and the superior, posterior, and medial edge of the transverse process of L4.  Under fluoroscopic guidance, a Radiofrequency needle was inserted until contact was made with os over the superior postero-lateral aspect of the pedicular shadow (target area). Sensory and motor testing was conducted to properly adjust the position of the needle. Once satisfactory placement of the needle was achieved, the numbing solution was slowly injected after negative aspiration for blood. 2.0 mL of the nerve block solution was injected without difficulty or complication. After waiting for at least 3 minutes, the ablation was performed. Once completed, the needle was removed intact. L4 Medial Branch Nerve RFA: The target area for the L4 medial branch is at the junction of the postero-lateral aspect of the superior articular process and the superior, posterior, and medial edge of the transverse process of L5. Under fluoroscopic guidance, a Radiofrequency needle was inserted until contact was made with os over the superior postero-lateral aspect of the pedicular shadow (target area). Sensory and motor testing was conducted to properly adjust the position of the needle. Once satisfactory placement of the needle was achieved, the numbing solution was slowly injected after negative aspiration for blood. 2.0 mL of the nerve block solution was injected without difficulty or complication. After waiting for at least 3 minutes, the ablation was performed. Once completed, the needle was removed intact.  Radiofrequency lesioning (ablation):  Radiofrequency Generator: Medtronic AccurianTM AG 1000 RF Generator Sensory Stimulation Parameters: 50 Hz was used to locate &  identify the nerve, making sure that the needle was positioned such that there was no sensory stimulation below 0.3 V or above 0.7 V. Motor Stimulation Parameters: 2 Hz was used to evaluate the motor component. Care was taken not to lesion any nerves that demonstrated motor stimulation of the lower extremities at an output of less than 2.5 times that of the sensory threshold, or a maximum of 2.0 V. Lesioning Technique Parameters: Standard Radiofrequency settings. (Not bipolar or pulsed.) Temperature Settings: 80 degrees C Lesioning time: 60 seconds Intra-operative Compliance: Compliant  Once the entire procedure was completed, the treated area was cleaned, making sure to leave some of the prepping solution back to take advantage of its long term bactericidal properties.    Illustration of the posterior view of the lumbar spine and the posterior neural structures. Laminae of L2 through S1 are labeled. DPRL5, dorsal primary ramus of L5; DPRS1, dorsal primary ramus of S1; DPR3, dorsal primary ramus of L3; FJ, facet (zygapophyseal) joint L3-L4; I, inferior articular process of L4; LB1, lateral branch of dorsal primary ramus of L1; IAB, inferior articular branches from L3 medial branch (supplies L4-L5 facet joint); IBP, intermediate branch plexus; MB3, medial branch of dorsal primary ramus of L3; NR3, third lumbar nerve root; S, superior articular process of L5; SAB, superior articular branches from L4 (supplies L4-5 facet joint also); TP3, transverse process of L3.  Facet Joint Innervation (* possible contribution)  L1-2 T12, L1 (L2*)  Medial Branch  L2-3 L1, L2 (L3*)         "          "  L3-4 L2, L3 (L4*)         "          "  L4-5 L3, L4 (L5*)         "          "  L5-S1 L4, L5, S1          "          "   Vitals:   06/25/22 0919 06/25/22 1610  06/25/22 0937 06/25/22 0946  BP: 139/78 (!) 143/77 129/82 128/80  Pulse:      Resp: 16 16 17 16   Temp:      TempSrc:      SpO2: 100% 100% 100% 100%   Weight:      Height:        Start Time: 0851 hrs. End Time: 0919 hrs.  Imaging Guidance (Spinal):          Type of Imaging Technique: Fluoroscopy Guidance (Spinal) Indication(s): Assistance in needle guidance and placement for procedures requiring needle placement in or near specific anatomical locations not easily accessible without such assistance. Exposure Time: Please see nurses notes. Contrast: None used. Fluoroscopic Guidance: I was personally present during the use of fluoroscopy. "Tunnel Vision Technique" used to obtain the best possible view of the target area. Parallax error corrected before commencing the procedure. "Direction-depth-direction" technique used to introduce the needle under continuous pulsed fluoroscopy. Once target was reached, antero-posterior, oblique, and lateral fluoroscopic projection used confirm needle placement in all planes. Images permanently stored in EMR. Interpretation: No contrast injected. I personally interpreted the imaging intraoperatively. Adequate needle placement confirmed in multiple planes. Permanent images saved into the patient's record.  Antibiotic Prophylaxis:   Anti-infectives (From admission, onward)    None      Indication(s): None identified  Post-operative Assessment:  Post-procedure Vital Signs:  Pulse/HCG Rate: (!) 54(!) 53 Temp: 98.2 F (36.8 C) Resp: 16 BP: 128/80 SpO2: 100 %  EBL: None  Complications: No immediate post-treatment complications observed by team, or reported by patient.  Note: The patient tolerated the entire procedure well. A repeat set of vitals were taken after the procedure and the patient was kept under observation following institutional policy, for this type of procedure. Post-procedural neurological assessment was performed, showing return to baseline, prior to discharge. The patient was provided with post-procedure discharge instructions, including a section on how to identify potential  problems. Should any problems arise concerning this procedure, the patient was given instructions to immediately contact us, at any time, without hesitation. In any case, we plan to contact the patient by telephone for a follow-up status report regarding this interventional procedure.  Comments:  No additional relevant information.  Plan of Care (POC)  Orders:  Orders Placed This Encounter  Procedures   Radiofrequency,Lumbar    Scheduling Instructions:     Side(s): Right-sided     Level: L2-3, L3-4, and L4-5 Facets (L1, L2, L3, and L4 Medial Branch)     Sedation: With Sedation.     Timeframe: Today    Order Specific Question:   Where will this procedure be performed?    Answer:   [facility]   DG [facility] C-ARM 1-60 MIN NO REPORT    Intraoperative interpretation by procedural physician at [facility].    Standing Status:   Standing    Number of Occurrences:   1    Order Specific Question:   Reason for exam:    Answer:   Assistance in needle guidance and placement for procedures requiring needle placement in or near specific anatomical locations not easily accessible without such assistance.   Informed Consent Details: Physician/Practitioner Attestation; Transcribe to consent form and obtain patient signature    Nursing Order: Transcribe to consent form and obtain patient signature. Note: Always confirm laterality of pain with Mr. Zinda, before procedure.    Order Specific Question:   Physician/Practitioner attestation of informed consent for procedure/surgical case    Answer:  I, the physician/practitioner, attest that I have discussed with the patient the benefits, risks, side effects, alternatives, likelihood of achieving goals and potential problems during recovery for the procedure that I have provided informed consent.    Order Specific Question:   Procedure    Answer:   Lumbar Facet Radiofrequency Ablation    Order Specific Question:    Physician/Practitioner performing the procedure    Answer:   Eilish Mcdaniel A. Laban Emperor, MD    Order Specific Question:   Indication/Reason    Answer:   Low Back Pain, with our without leg pain, due to Facet Joint Arthralgia (Joint Pain) known as Lumbar Facet Syndrome, secondary to Lumbar, and/or Lumbosacral Spondylosis (Arthritis of the Spine), without myelopathy or radiculopathy (Nerve Damage).   Provide equipment / supplies at bedside    Procedure tray: "Radiofrequency Tray" Additional material: Large hemostat (x1); Small hemostat (x1); Towels (x8); 4x4 sterile sponge pack (x1) Needle type: Teflon-coated Radiofrequency Needle (Disposable  single use) Size: Regular Quantity: 4    Standing Status:   Standing    Number of Occurrences:   1    Order Specific Question:   Specify    Answer:   Radiofrequency Tray   Chronic Opioid Analgesic:  No chronic opioid analgesics therapy prescribed by our practice. None MME/day: 0 mg/day   Medications ordered for procedure: Meds ordered this encounter  Medications   lidocaine (XYLOCAINE) 2 % (with pres) injection 400 mg   pentafluoroprop-tetrafluoroeth (GEBAUERS) aerosol   lactated ringers infusion   midazolam (VERSED) 5 MG/5ML injection 0.5-2 mg    Make sure Flumazenil is available in the pyxis when using this medication. If oversedation occurs, administer 0.2 mg IV over 15 sec. If after 45 sec no response, administer 0.2 mg again over 1 min; may repeat at 1 min intervals; not to exceed 4 doses (1 mg)   fentaNYL (SUBLIMAZE) injection 25-50 mcg    Make sure Narcan is available in the pyxis when using this medication. In the event of respiratory depression (RR< 8/min): Titrate NARCAN (naloxone) in increments of 0.1 to 0.2 mg IV at 2-3 minute intervals, until desired degree of reversal.   ropivacaine (PF) 2 mg/mL (0.2%) (NAROPIN) injection 9 mL   triamcinolone acetonide (KENALOG-40) injection 40 mg   HYDROcodone-acetaminophen (NORCO/VICODIN) 5-325 MG tablet     Sig: Take 1 tablet by mouth every 6 (six) hours as needed for up to 7 days for severe pain. Must last 7 days.    Dispense:  28 tablet    Refill:  0    For acute post-operative pain. Not to be refilled. Must last 7 days.   HYDROcodone-acetaminophen (NORCO/VICODIN) 5-325 MG tablet    Sig: Take 1 tablet by mouth every 6 (six) hours as needed for up to 7 days for severe pain. Must last 7 days.    Dispense:  28 tablet    Refill:  0    For acute post-operative pain. Not to be refilled.  Must last 7 days.   Medications administered: We administered lidocaine, pentafluoroprop-tetrafluoroeth, lactated ringers, midazolam, fentaNYL, ropivacaine (PF) 2 mg/mL (0.2%), and triamcinolone acetonide.  See the medical record for exact dosing, route, and time of administration.  Follow-up plan:   Return in about 2 weeks (around 07/09/2022) for (ECT): (L) L-FCT RFA #1 (L1-L4 MB) + PPE.       Interventional Therapies  Risk Factors  Considerations:   Drug hypersensitivity: Oral prednisone (hyperactivity) Movement disorder (Parkinson's dyskinesia)  unsteady gait  tremor  Hx. Seizures  Carotid stenosis  orthostatic hypotension  anxiety/depression  GERD      Levoscoliosis (L3-4 apex)     Planned  Pending:   Therapeutic right (L2-3, L3-4, L4-5) lumbar facet RFA #1     Under consideration:   Therapeutic right L1-2 LESI #3  Diagnostic bilateral L2-3 & L3-4 lumbar facet (L1-4) MBB #2  Diagnostic x-rays of the cervical spine for further evaluation of his cervicalgia.   Completed:   Diagnostic/therapeutic bilateral L2 + L3 TFESI x1 (05/07/2022) (50/50/25/20)  Diagnostic bilateral L2-3 & L3-4 Facet Blk (L1-4 MBB) x1 (12/23/2021) (75/75/75 x2 weeks)  Diagnostic bilateral lumbar facet (L2-S1) MBB x1 (07/08/2021) (50/50/50/<50)  Therapeutic right L1-2 LESI x2 (01/27/2022) (1st:100/100/50/50) (2nd: 75/75/50/50)  Diagnostic right L2-3 LESI x1 (06/03/2021) (100/100/45/45)  Referral to physical therapy for  LB PT as well as a back brace eval. (04/28/2021) (water aerobics)    Therapeutic  Palliative (PRN) options:   None w/o F2F evaluation by provider.   Pharmacotherapy  Drug hypersensitivity: Oral prednisone (hyperactivity)    Pharmacologic Recommendations  For a more complete explanation please see the dictation in the body of my 05/21/2022 note. Oxycodone 5 mg PO TID to QID PRN for pain.         Recent Visits Date Type Provider Dept  05/21/22 Office Visit Delano Metz, MD [facility]  05/07/22 Procedure visit Delano Metz, MD [facility]  04/29/22 Office Visit Delano Metz, MD [facility]  Showing recent visits within past 90 days and meeting all other requirements Today's Visits Date Type Provider Dept  06/25/22 Procedure visit Delano Metz, MD [facility]  Showing today's visits and meeting all other requirements Future Appointments No visits were found meeting these conditions. Showing future appointments within next 90 days and meeting all other requirements  Disposition: Discharge home  Discharge (Date  Time): 06/25/2022; 0951 hrs.   Primary Care Physician: Smitty Cords, DO Location: [facility] Note by: Oswaldo Done, MD (TTS technology used. I apologize for any typographical errors that were not detected and corrected.) Date: 06/25/2022; Time: 10:14 AM  Disclaimer:  Medicine is not an Visual merchandiser. The only guarantee in medicine is that nothing is guaranteed. It is important to note that the decision to proceed with this intervention was based on the information collected from the patient. The Data and conclusions were drawn from the patient's questionnaire, the interview, and the physical examination. Because the information was provided in large part by the patient, it cannot be guaranteed that it has not been purposely or unconsciously manipulated. Every  effort has been made to obtain as much relevant data as possible for this evaluation. It is important to note that the conclusions that lead to this procedure are derived in large part from the available data. Always take into account that the treatment will also be dependent on availability of resources and existing treatment guidelines, considered by other Pain Management Practitioners as being common knowledge and practice, at the time of the intervention. For Medico-Legal purposes, it is also important to point out that variation in procedural techniques and pharmacological choices are the acceptable norm. The indications, contraindications, technique, and results of the above procedure should only be interpreted and judged by a Board-Certified Interventional Pain Specialist with extensive familiarity and expertise in the same exact procedure and technique.

## 2022-06-25 ENCOUNTER — Ambulatory Visit: Payer: Medicare HMO | Admitting: Physical Therapy

## 2022-06-25 ENCOUNTER — Ambulatory Visit: Payer: Medicare HMO | Attending: Pain Medicine | Admitting: Pain Medicine

## 2022-06-25 ENCOUNTER — Encounter: Payer: Self-pay | Admitting: Pain Medicine

## 2022-06-25 ENCOUNTER — Ambulatory Visit
Admission: RE | Admit: 2022-06-25 | Discharge: 2022-06-25 | Disposition: A | Discharge status: Home / Self Care | Payer: Medicare HMO | Source: Ambulatory Visit | Attending: Pain Medicine | Admitting: Pain Medicine

## 2022-06-25 VITALS — BP 128/80 | HR 54 | Temp 98.2°F | Resp 16 | Ht 66.0 in | Wt 130.0 lb

## 2022-06-25 DIAGNOSIS — M545 Low back pain, unspecified: Secondary | ICD-10-CM | POA: Diagnosis present

## 2022-06-25 DIAGNOSIS — M47817 Spondylosis without myelopathy or radiculopathy, lumbosacral region: Secondary | ICD-10-CM

## 2022-06-25 DIAGNOSIS — M47816 Spondylosis without myelopathy or radiculopathy, lumbar region: Secondary | ICD-10-CM

## 2022-06-25 DIAGNOSIS — M5137 Other intervertebral disc degeneration, lumbosacral region: Secondary | ICD-10-CM | POA: Diagnosis present

## 2022-06-25 DIAGNOSIS — M4310 Spondylolisthesis, site unspecified: Secondary | ICD-10-CM

## 2022-06-25 DIAGNOSIS — R937 Abnormal findings on diagnostic imaging of other parts of musculoskeletal system: Secondary | ICD-10-CM

## 2022-06-25 DIAGNOSIS — G8918 Other acute postprocedural pain: Secondary | ICD-10-CM | POA: Diagnosis present

## 2022-06-25 DIAGNOSIS — G8929 Other chronic pain: Secondary | ICD-10-CM | POA: Diagnosis present

## 2022-06-25 DIAGNOSIS — M5459 Other low back pain: Secondary | ICD-10-CM | POA: Diagnosis present

## 2022-06-25 DIAGNOSIS — M51379 Other intervertebral disc degeneration, lumbosacral region without mention of lumbar back pain or lower extremity pain: Secondary | ICD-10-CM

## 2022-06-25 MED ORDER — HYDROCODONE-ACETAMINOPHEN 5-325 MG PO TABS
1.0000 | ORAL_TABLET | Freq: Four times a day (QID) | ORAL | 0 refills | Status: AC | PRN
Start: 2022-07-02 — End: 2022-07-09

## 2022-06-25 MED ORDER — ROPIVACAINE HCL 2 MG/ML IJ SOLN
9.0000 mL | Freq: Once | INTRAMUSCULAR | Status: AC
  Administered 2022-06-25: 9 mL via PERINEURAL

## 2022-06-25 MED ORDER — MIDAZOLAM HCL 5 MG/5ML IJ SOLN
0.5000 mg | Freq: Once | INTRAMUSCULAR | Status: AC
  Administered 2022-06-25: 1.5 mg via INTRAVENOUS

## 2022-06-25 MED ORDER — FENTANYL CITRATE (PF) 100 MCG/2ML IJ SOLN
25.0000 ug | INTRAMUSCULAR | Status: DC | PRN
  Administered 2022-06-25: 25 ug via INTRAVENOUS

## 2022-06-25 MED ORDER — MIDAZOLAM HCL 5 MG/5ML IJ SOLN
INTRAMUSCULAR | Status: AC
  Filled 2022-06-25: qty 5

## 2022-06-25 MED ORDER — TRIAMCINOLONE ACETONIDE 40 MG/ML IJ SUSP
40.0000 mg | Freq: Once | INTRAMUSCULAR | Status: AC
  Administered 2022-06-25: 40 mg

## 2022-06-25 MED ORDER — FENTANYL CITRATE (PF) 100 MCG/2ML IJ SOLN
INTRAMUSCULAR | Status: AC
  Filled 2022-06-25: qty 2

## 2022-06-25 MED ORDER — PENTAFLUOROPROP-TETRAFLUOROETH EX AERO
INHALATION_SPRAY | Freq: Once | CUTANEOUS | Status: AC
  Administered 2022-06-25: 30 via TOPICAL
  Filled 2022-06-25: qty 116

## 2022-06-25 MED ORDER — HYDROCODONE-ACETAMINOPHEN 5-325 MG PO TABS
1.0000 | ORAL_TABLET | Freq: Four times a day (QID) | ORAL | 0 refills | Status: AC | PRN
Start: 2022-06-25 — End: 2022-07-02

## 2022-06-25 MED ORDER — LACTATED RINGERS IV SOLN: Freq: Once | INTRAVENOUS | Status: AC

## 2022-06-25 MED ORDER — LIDOCAINE HCL 2 % IJ SOLN
20.0000 mL | Freq: Once | INTRAMUSCULAR | Status: AC
  Administered 2022-06-25: 100 mg

## 2022-06-25 NOTE — Patient Instructions (Addendum)
___________________________________________________________________________________________  Post-Radiofrequency (RF) Discharge Instructions  You have just completed a Radiofrequency Neurotomy.  The following instructions will provide you with information and guidelines for self-care upon discharge.  If at any time you have questions or concerns please call your physician. DO NOT DRIVE YOURSELF!!  Instructions: Apply ice: Fill a plastic sandwich bag with crushed ice. Cover it with a small towel and apply to injection site. Apply for 15 minutes then remove x 15 minutes. Repeat sequence on day of procedure, until you go to bed. The purpose is to minimize swelling and discomfort after procedure. Apply heat: Apply heat to procedure site starting the day following the procedure. The purpose is to treat any soreness and discomfort from the procedure. Food intake: No eating limitations, unless stipulated above.  Nevertheless, if you have had sedation, you may experience some nausea.  In this case, it may be wise to wait at least two hours prior to resuming regular diet. Physical activities: Keep activities to a minimum for the first 8 hours after the procedure. For the first 24 hours after the procedure, do not drive a motor vehicle,  Operate heavy machinery, power tools, or handle any weapons.  Consider walking with the use of an assistive device or accompanied by an adult for the first 24 hours.  Do not drink alcoholic beverages including beer.  Do not make any important decisions or sign any legal documents. Go home and rest today.  Resume activities tomorrow, as tolerated.  Use caution in moving about as you may experience mild leg weakness.  Use caution in cooking, use of household electrical appliances and climbing steps. Driving: If you have received any sedation, you are not allowed to drive for 24 hours after your procedure. Blood thinner: Restart your blood thinner 6 hours after your procedure.  (Only for those taking blood thinners) Insulin: As soon as you can eat, you may resume your normal dosing schedule. (Only for those taking insulin) Medications: May resume pre-procedure medications.  Do not take any drugs, other than what has been prescribed to you. Infection prevention: Keep procedure site clean and dry. Post-procedure Pain Diary: Extremely important that this be done correctly and accurately. Recorded information will be used to determine the next step in treatment. Pain evaluated is that of treated area only. Do not include pain from an untreated area. Complete every hour, on the hour, for the initial 8 hours. Set an alarm to help you do this part accurately. Do not go to sleep and have it completed later. It will not be accurate. Follow-up appointment: Keep your follow-up appointment after the procedure. Usually 2-6 weeks after radiofrequency. Bring you pain diary. The information collected will be essential for your long-term care.   Expect: From numbing medicine (AKA: Local Anesthetics): Numbness or decrease in pain. Onset: Full effect within 15 minutes of injected. Duration: It will depend on the type of local anesthetic used. On the average, 1 to 8 hours.  From steroids (when added): Decrease in swelling or inflammation. Once inflammation is improved, relief of the pain will follow. Onset of benefits: Depends on the amount of swelling present. The more swelling, the longer it will take for the benefits to be seen. In some cases, up to 10 days. Duration: Steroids will stay in the system x 2 weeks. Duration of benefits will depend on multiple posibilities including persistent irritating factors. From procedure: Some discomfort is to be expected once the numbing medicine wears off. In the case of radiofrequency procedures,   this may last as long as 6 weeks. Additional post-procedure pain medication is provided for this. Discomfort is minimized if ice and heat are applied as  instructed.  Call if: You experience numbness and weakness that gets worse with time, as opposed to wearing off. He experience any unusual bleeding, difficulty breathing, or loss of the ability to control your bowel and bladder. (This applies to Spinal procedures only) You experience any redness, swelling, heat, red streaks, elevated temperature, fever, or any other signs of a possible infection.  Emergency Numbers: Durning business hours (Monday - Thursday, 8:00 AM - 4:00 PM) (Friday, 9:00 AM - 12:00 Noon): [phone] After hours: [phone] ____________________________________________________________________________________________   ______________________________________________________________________  Procedure instructions  Do not eat or drink fluids (other than water) for 6 hours before your procedure  No water for 2 hours before your procedure  Take your blood pressure medicine with a sip of water  Arrive 30 minutes before your appointment  Carefully read the "Preparing for your procedure" detailed instructions  If you have questions call us at [phone]  _____________________________________________________________________    ______________________________________________________________________  Preparing for your procedure  Appointments: If you think you may not be able to keep your appointment, call 24-48 hours in advance to cancel. We need time to make it available to others.  During your procedure appointment there will be: No Prescription Refills. No disability issues to discussed. No medication changes or discussions.  Instructions: Food intake: Avoid eating anything solid for at least 8 hours prior to your procedure. Clear liquid intake: You may take clear liquids such as water up to 2 hours prior to your procedure. (No carbonated drinks. No soda.) Transportation: Unless otherwise stated by your physician, bring a driver. Morning  Medicines: Except for blood thinners, take all of your other morning medications with a sip of water. Make sure to take your heart and blood pressure medicines. If your blood pressure's lower number is above 100, the case will be rescheduled. Blood thinners: Make sure to stop your blood thinners as instructed.  If you take a blood thinner, but were not instructed to stop it, call our office [phone] and ask to talk to a nurse. Not stopping a blood thinner prior to certain procedures could lead to serious complications. Diabetics on insulin: Notify the staff so that you can be scheduled 1st case in the morning. If your diabetes requires high dose insulin, take only  of your normal insulin dose the morning of the procedure and notify the staff that you have done so. Preventing infections: Shower with an antibacterial soap the morning of your procedure.  Build-up your immune system: Take 1000 mg of Vitamin C with every meal (3 times a day) the day prior to your procedure. Antibiotics: Inform the nursing staff if you are taking any antibiotics or if you have any conditions that may require antibiotics prior to procedures. (Example: recent joint implants)   Pregnancy: If you are pregnant make sure to notify the nursing staff. Not doing so may result in injury to the fetus, including death.  Sickness: If you have a cold, fever, or any active infections, call and cancel or reschedule your procedure. Receiving steroids while having an infection may result in complications. Arrival: You must be in the facility at least 30 minutes prior to your scheduled procedure. Tardiness: Your scheduled time is also the cutoff time. If you do not arrive at least 15 minutes prior to your procedure, you will be rescheduled.    Children: Do not bring any children with you. Make arrangements to keep them home. Dress appropriately: There is always a possibility that your clothing may get soiled. Avoid long dresses. Valuables:  Do not bring any jewelry or valuables.  Reasons to call and reschedule or cancel your procedure: (Following these recommendations will minimize the risk of a serious complication.) Surgeries: Avoid having procedures within 2 weeks of any surgery. (Avoid for 2 weeks before or after any surgery). Flu Shots: Avoid having procedures within 2 weeks of a flu shots or . (Avoid for 2 weeks before or after immunizations). Barium: Avoid having a procedure within 7-10 days after having had a radiological study involving the use of radiological contrast. (Myelograms, Barium swallow or enema study). Heart attacks: Avoid any elective procedures or surgeries for the initial 6 months after a "Myocardial Infarction" (Heart Attack). Blood thinners: It is imperative that you stop these medications before procedures. Let us know if you if you take any blood thinner.  Infection: Avoid procedures during or within two weeks of an infection (including chest colds or gastrointestinal problems). Symptoms associated with infections include: Localized redness, fever, chills, night sweats or profuse sweating, burning sensation when voiding, cough, congestion, stuffiness, runny nose, sore throat, diarrhea, nausea, vomiting, cold or Flu symptoms, recent or current infections. It is specially important if the infection is over the area that we intend to treat. Heart and lung problems: Symptoms that may suggest an active cardiopulmonary problem include: cough, chest pain, breathing difficulties or shortness of breath, dizziness, ankle swelling, uncontrolled high or unusually low blood pressure, and/or palpitations. If you are experiencing any of these symptoms, cancel your procedure and contact your primary care physician for an evaluation.  Remember:  Regular Business hours are:  Monday to Thursday 8:00 AM to 4:00 PM  Provider's Schedule: Mariame Rybolt, MD:  Procedure days: Tuesday and Thursday 7:30 AM to 4:00 PM  Bilal  Lateef, MD:  Procedure days: Monday and Wednesday 7:30 AM to 4:00 PM  ______________________________________________________________________    ____________________________________________________________________________________________  General Risks and Possible Complications  Patient Responsibilities: It is important that you read this as it is part of your informed consent. It is our duty to inform you of the risks and possible complications associated with treatments offered to you. It is your responsibility as a patient to read this and to ask questions about anything that is not clear or that you believe was not covered in this document.  Patient's Rights: You have the right to refuse treatment. You also have the right to change your mind, even after initially having agreed to have the treatment done. However, under this last option, if you wait until the last second to change your mind, you may be charged for the materials used up to that point.  Introduction: Medicine is not an exact science. Everything in Medicine, including the lack of treatment(s), carries the potential for danger, harm, or loss (which is by definition: Risk). In Medicine, a complication is a secondary problem, condition, or disease that can aggravate an already existing one. All treatments carry the risk of possible complications. The fact that a side effects or complications occurs, does not imply that the treatment was conducted incorrectly. It must be clearly understood that these can happen even when everything is done following the highest safety standards.  No treatment: You can choose not to proceed with the proposed treatment alternative. The "PRO(s)" would include: avoiding the risk of complications associated with the therapy. The "CON(s)" would   include: not getting any of the treatment benefits. These benefits fall under one of three categories: diagnostic; therapeutic; and/or palliative. Diagnostic benefits  include: getting information which can ultimately lead to improvement of the disease or symptom(s). Therapeutic benefits are those associated with the successful treatment of the disease. Finally, palliative benefits are those related to the decrease of the primary symptoms, without necessarily curing the condition (example: decreasing the pain from a flare-up of a chronic condition, such as incurable terminal cancer).  General Risks and Complications: These are associated to most interventional treatments. They can occur alone, or in combination. They fall under one of the following six (6) categories: no benefit or worsening of symptoms; bleeding; infection; nerve damage; allergic reactions; and/or death. No benefits or worsening of symptoms: In Medicine there are no guarantees, only probabilities. No healthcare provider can ever guarantee that a medical treatment will work, they can only state the probability that it may. Furthermore, there is always the possibility that the condition may worsen, either directly, or indirectly, as a consequence of the treatment. Bleeding: This is more common if the patient is taking a blood thinner, either prescription or over the counter (example: Goody Powders, Fish oil, Aspirin, Garlic, etc.), or if suffering a condition associated with impaired coagulation (example: Hemophilia, cirrhosis of the liver, low platelet counts, etc.). However, even if you do not have one on these, it can still happen. If you have any of these conditions, or take one of these drugs, make sure to notify your treating physician. Infection: This is more common in patients with a compromised immune system, either due to disease (example: diabetes, cancer, human immunodeficiency virus [HIV], etc.), or due to medications or treatments (example: therapies used to treat cancer and rheumatological diseases). However, even if you do not have one on these, it can still happen. If you have any of these  conditions, or take one of these drugs, make sure to notify your treating physician. Nerve Damage: This is more common when the treatment is an invasive one, but it can also happen with the use of medications, such as those used in the treatment of cancer. The damage can occur to small secondary nerves, or to large primary ones, such as those in the spinal cord and brain. This damage may be temporary or permanent and it may lead to impairments that can range from temporary numbness to permanent paralysis and/or brain death. Allergic Reactions: Any time a substance or material comes in contact with our body, there is the possibility of an allergic reaction. These can range from a mild skin rash (contact dermatitis) to a severe systemic reaction (anaphylactic reaction), which can result in death. Death: In general, any medical intervention can result in death, most of the time due to an unforeseen complication. ____________________________________________________________________________________________    

## 2022-06-26 ENCOUNTER — Telehealth: Payer: Self-pay

## 2022-06-26 NOTE — Telephone Encounter (Signed)
Post procedure phone call.  Patients wife states he is doing fine.  Having some pain but he is taking his pain meds. Instructed to call us for any other questions or concerns.

## 2022-06-30 ENCOUNTER — Encounter: Payer: Self-pay | Admitting: Psychiatry

## 2022-06-30 ENCOUNTER — Ambulatory Visit (INDEPENDENT_AMBULATORY_CARE_PROVIDER_SITE_OTHER): Payer: Medicare HMO | Admitting: Psychiatry

## 2022-06-30 ENCOUNTER — Ambulatory Visit: Payer: Medicare HMO | Admitting: Physical Therapy

## 2022-06-30 DIAGNOSIS — M47817 Spondylosis without myelopathy or radiculopathy, lumbosacral region: Secondary | ICD-10-CM | POA: Diagnosis not present

## 2022-06-30 DIAGNOSIS — G20A1 Parkinson's disease without dyskinesia, without mention of fluctuations: Secondary | ICD-10-CM | POA: Diagnosis not present

## 2022-06-30 DIAGNOSIS — F331 Major depressive disorder, recurrent, moderate: Secondary | ICD-10-CM | POA: Diagnosis not present

## 2022-06-30 DIAGNOSIS — G894 Chronic pain syndrome: Secondary | ICD-10-CM

## 2022-06-30 DIAGNOSIS — F411 Generalized anxiety disorder: Secondary | ICD-10-CM | POA: Diagnosis not present

## 2022-06-30 NOTE — Progress Notes (Signed)
Joseph Arroyo [id number] 1940/07/20 82 y.o.  Virtual Visit via Telephone Note  I connected with pt by telephone and verified that I am speaking with the correct person using two identifiers.   I discussed the limitations, risks, security and privacy concerns of performing an evaluation and management service by telephone and the availability of in person appointments. I also discussed with the patient that there may be a patient responsible charge related to this service. The patient expressed understanding and agreed to proceed.  I discussed the assessment and treatment plan with the patient. The patient was provided an opportunity to ask questions and all were answered. The patient agreed with the plan and demonstrated an understanding of the instructions.   The patient was advised to call back or seek an in-person evaluation if the symptoms worsen or if the condition fails to improve as anticipated.  I provided 30 minutes of non-face-to-face time during this encounter. The patient was located at home and the provider was located office. Session 1100-1130 am.  Subjective:   Patient ID:  Joseph Arroyo is a 82 y.o. (DOB March 20, 1940) male.  Chief Complaint:  Chief Complaint  Patient presents with   Follow-up   Depression   Anxiety    Depression        Associated symptoms include fatigue.  Past medical history includes anxiety.   Anxiety Patient reports no dizziness or palpitations.     Joseph Arroyo ( lor-on) presents to the office today for follow-up of major depression and generalized anxiety disorder.    He was seen December, 2020 & June 2021.  No meds were changed.  Continued paroxetine 60 mg daily plus gabapentin 100 mg twice daily and Mirapex 0.125 mg 3 times daily.   08/2019 appt without med changes noted: Tolerates the meds well.  Wife thinks he's doing very well and he agrees.   No complaints.  2 D's.   Still exercising and has fatigue but can  function and enjoys activity. Restarted men's Bible study.  He feels positive and hopeful and useful.  At some point would like to be free of medication.  Does not feel guilty about the meds.    03/19/2020 appt noted:  Seen with wife Joseph Arroyo August and took Ivermectin and both had it mild. As far mood concerned he's doing well.  Wife agrees.  Not markedly depressed or anxious.  Feels the suffering of others.  Tally Joseph Arroyo in hospital in [place].  Friend with Covid also. No episodes. Tolerating meds.  No Xanax used.  Attend [employer] in [place]. Usually sleep well but back pain can interfere with sleep. TX for PD and a bit slower per wife. Plan: Try increasing gabapentin to 100 mg AM & 200 mg PM for back pain.   Continue paroxetine 60 mg daily and Deplin 15 mg daily  10/03/2020 appointment with the following noted: Seen with wife, Joseph Arroyo and on increased gabapentin at PM to 200 mg for back pain and sleeps better.   Not depressed.  Anxiety is manageable.   Seeing chiropracter who he likes Dr. Steve Rattler in [place]. No SE noted with meds.  Perspire heavily. No panic attacks.   Sleep variable and is usually OK. Still leads men's Bible Study. Uses [employer] Plan: No med change  03/25/2021 appointment with the following noted: with wife Joseph Arroyo but came on quickly. Shaking with anxiety and been going on for 3-4 weeks. Rare use of Xanax. A lot of physical  px with constant pain with back. Pending workup.  Fell 3 weeks ago. T pain clinic now.  Everything seemed to pile up. Insomnia with mind racing with worry.  He can't tolerate negativity.   Rare panic. Consistent with paroxetine 60 mg daily. Consistent with gabapentin 100 AM and 200 PM Plan: For persistent symptoms of anxiety we will start Arroyo label trial of clonidine.  Cautioned about side effects.  05/13/2021 appointment with the following noted: wife Joseph Arroyo on call Taking clonidine 0.1 mg tablet 1/2 tablets twice  daily. Can't tell if there has been benefit from this.   Still taking Xanax but usually about one daily. Couldn't get here DT pain.  Been to pain clinic but no meds yet.  Problems with insurance company giving approval for the tests.  At times pain is excruciating.   BP has been OK.   Being cautious about getting up and protecting from dizziness. Tremors will wax and wane and worse with stress.   Fighting over the idea God does not want him to be like this and feels there is something wrong with him.   No SI  "that's not an option".   Plan: Continue paroxetine 60 mg daily DT PD hesitate to use atypicals for TR anxiety. Consider increasing gabapentin for anxiety Arroyo label.  Yes increase to 200 mg BID Ok Xanax  Clonidine 0.1 mg tablets 1/2 tablet twice daily    07/03/2021 appointment with the following noted: seen with Joseph Arroyo on call Using Xanax prn 1-2 daily. Increased gabapentin 100 mg AM and noon and 200 mg PM; hard to tell if it helped any but it might have.  No SE noted. Feels fine today and thinks there's improvement.  Believe the Reita Cliche is bringing him through this. Epidural recently.  Some pain relief. Per wife has had some anxious moments.   Tremors vary.   Some PT, balance a little worse since Xmas.   No low BP. When sleep is fine but 2-3 hours at a time and nocturia. He thinks depression is a lot better, today I feel good.  Needs to get out socially.  He's a people person.  She thinks today is best day since Jan.   Plan: Consider increasing gabapentin for anxiety Arroyo label.  Yes increase to 200 mg BID  09/09/2021 appointment with following noted: with wife Joseph Arroyo Doing ok thank you. A lot better with depression and anxiety. Gabapentin 100 mg AM and noon and 200 HS. Gets sleepy in Am after breakfast with Sinemet.  Takes a nap. Sleep pretty well. Disc scamming but they have been careful. Wife notices anxiety and tremors reenforce each other.  Only one Xanax daily.  Wants to go out  daily. Not attending main church but can go to house church and is getting out more. No SE except sleepiness. 3 back pain procedures with some benefit. Started speech therapy and OT and he likes to be active. Plan: no med changes Continur paroxetine 60 mg daily gabapentin 100 mg capsules 1 every morning and noon and 200 mg nightly,  01/12/22 appt noted:  (Joseph Arroyo) Continues alprazolam 0.25 mg 3 times daily as needed anxiety (taking 1 daily), clonidine 0.1 mg tablets one half twice daily, gabapentin 100 mg every morning and noon and 200 mg nightly, L-methylfolate, paroxetine 20 mg every morning and 40 mg nightly, pramipexole 0.125 mg nightly. Increased carbi-levo to 2 and 1/2 tab TID.   Vivid dreams.  Last night a disturbing one.  A couple of times in  past remotely acted out the dream.   Feeling tired with the meds.  Pain meds also make him tired and PD too.  Continues to believe God will heal him completely.  Getting facet blocks at the pain doc.  Continues shots. Depression is minimal but more anxiety than depression.  Will sometimes Xanax for tremor.  Not alsways used daily.  Finished speech therapy but still doing PT.  Is better when he goes out somewhere.  Family coming soon,  Sleep is otherwise OK.   Tendency to constipation or diarrhea.   Asks about natural meds to help his medical problems if possible.  He'd like some changes .   Church built him a ramp for his house. Plan: No med changes   05/13/2022 appointment noted:  with Joseph Moulding, reviewed med list with her Not taking pramipexole hs but is taking gabapentin. More difficulty speaking than he has been in the past.   Holding my own.  Struggling quite a bit.  More anxiety provoked by tremor or bad dreams.  Latter caused bad dreams.  Still some.  Not acting out dreams like before.  Bad dreams about 2 times per week.   Usually sleep 2 hours at time with nocturia.   PD sx are worse per wife.  Harder to walk and do things.   No VH with meds  for PD. Anxiety remains pretty high and needs the Xanax.   ? Tiredness with meds.   Plan: Trial wean  and if anxiety is worse then resume Clonidine 0.1 mg tablets 1/2 tablet twice daily    06/30/22 appt noted: with Joseph Moulding Radio procedure for pain recently and too early to tell how it will work.  Never had it before.   Taking alprazolam prn Taking gabapentin 100 mg prn.  Clonidine 0.05 mg HS. Paroxetine 60 mg daily. BP is better with less.   Anxiety worse middday when tremors are worse. Sleep 2 h then nocturia and then 1/2 hour to get back to sleep.  May take 1/2 hour to get back to bed DT PD slowness. Unsure how much sleep he's getting.  Not sure if reducing clonidine increased the anxiety or not. No panic.  No crying spells.  Can't cry.    Was 12 years free of medication until this last relapse and yet it took a long time to get relief again.  Wife, Joseph Moulding, said overall he's been doing well.  No prn BZ in a couple of years.  Past Psychiatric Medication Trials: Under our care since 2013. Paroxetine 60, duloxetine, fluoxetine, Luvox, venlafaxine, Trintellix, ,  mirtazapine, doxepin,  Gabapentin 200 BID Deplin,  clonazepam, lorazepam, alprazolam  buspirone,   Clonidine 0.05 BID for anxiety lamotrigine,   Abilify, Seroquel, Zyprexa,  risperidone, Saphris,  lithium with some benefit,  Pramipexole   Review of Systems:  Review of Systems  Constitutional:  Positive for fatigue.  HENT:  Positive for voice change.   Cardiovascular:  Negative for palpitations.  Gastrointestinal:  Positive for diarrhea.  Musculoskeletal:  Positive for back pain and gait problem.  Neurological:  Positive for tremors and weakness. Negative for dizziness.  Psychiatric/Behavioral:  Negative for dysphoric mood.     Medications: I have reviewed the patient's current medications.  Current Outpatient Medications  Medication Sig Dispense Refill   acetaminophen (TYLENOL) 500 MG tablet Take 1,000 mg by mouth every  8 (eight) hours as needed.     ALPRAZolam (XANAX) 0.25 MG tablet Take 1 tablet (0.25 mg total) by mouth 3 (  three) times daily as needed for anxiety. 90 tablet 1   B Complex Vitamins (VITAMIN B COMPLEX PO) Take 1 tablet by mouth daily.     baclofen (LIORESAL) 10 MG tablet Take 0.5-1 tablets (5-10 mg total) by mouth 3 (three) times daily as needed for muscle spasms. 90 each 1   Capsicum-Garlic 200-300 MG CAPS Take 308-657 mg by mouth.     carbidopa-levodopa (SINEMET IR) 25-100 MG tablet Take 2.5 tablets by mouth 3 (three) times daily.     cloNIDine (CATAPRES) 0.1 MG tablet TAKE 1/2 TABLET BY MOUTH TWICE DAILY (Patient taking differently: Take 0.1 mg by mouth daily. TAKE 1/2 TABLET BY MOUTH at night) 90 tablet 1   gabapentin (NEURONTIN) 100 MG capsule 1 in AM and noon and 2 at night 360 capsule 1   HAWTHORNE BERRY PO Take by mouth daily.     HYDROcodone-acetaminophen (NORCO/VICODIN) 5-325 MG tablet Take 1 tablet by mouth every 6 (six) hours as needed for up to 7 days for severe pain. Must last 7 days. 28 tablet 0   [START ON 07/02/2022] HYDROcodone-acetaminophen (NORCO/VICODIN) 5-325 MG tablet Take 1 tablet by mouth every 6 (six) hours as needed for up to 7 days for severe pain. Must last 7 days. 28 tablet 0   L-Methylfolate 15 MG TABS Take 1 tablet (15 mg total) by mouth daily. 90 tablet 3   Lutein 10 MG TABS Take 1 tablet by mouth 3 (three) times daily. 1 daily     oxyCODONE (OXY IR/ROXICODONE) 5 MG immediate release tablet Take 1 tablet (5 mg total) by mouth 3 (three) times daily as needed for severe pain. 45 tablet 0   PARoxetine (PAXIL) 20 MG tablet TAKE 1 TABLET BY MOUTH EVERY DAY (WITH THE 40 MG TABLET FOR 60 MG TOTAL) 90 tablet 1   PARoxetine (PAXIL) 40 MG tablet TAKE 1 TABLET BY MOUTH EVERY DAY (WITH THE 20 MG TABLET FOR 60 MG TOTAL) 90 tablet 1   Saw Palmetto, Serenoa repens, 1000 MG CAPS Take 2 capsules by mouth daily.     No current facility-administered medications for this visit.     Medication Side Effects: Fatigue and Other: from Mirapex  esp in AM  Allergies:  Allergies  Allergen Reactions   Prednisone     Hyperactivity    Wheat Diarrhea    Past Medical History:  Diagnosis Date   Allergic rhinitis due to pollen 11/21/2007   Brachial neuritis or radiculitis NOS    Cervicalgia    Concussion    age 75 - s/p accident   Costal chondritis    Depression    Essential and other specified forms of tremor    GERD (gastroesophageal reflux disease)    in past   Lumbago    Occlusion and stenosis of carotid artery without mention of cerebral infarction    Seizures (HCC)    age 9 - after concussion    Family History  Problem Relation Age of Onset   Heart failure Mother        enlarged heart    Asthma Father     Social History   Socioeconomic History   Marital status: Married    Spouse name: Joseph Moulding Valverde   Number of children: 2   Years of education: 12   Highest education level: Master's degree (e.g., MA, MS, MEng, MEd, MSW, MBA)  Occupational History   Occupation: Retired Film/video editor (English)    Employer: RETIRED  Tobacco Use   Smoking status: Never  Smokeless tobacco: Never  Vaping Use   Vaping Use: Never used  Substance and Sexual Activity   Alcohol use: No    Alcohol/week: 0.0 standard drinks of alcohol   Drug use: No   Sexual activity: Not Currently  Other Topics Concern   Not on file  Social History Narrative   Married to Joseph Moulding, has 2 children, has grandchildren   Right handed   Master's plus   He is born in [place], heritage is [place] (Father's side), [place] (Mother's side)      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    Social Determinants of Health   Financial Resource Strain: Low Risk  (06/03/2022)   Overall Financial Resource Strain (CARDIA)    Difficulty of Paying Living Expenses: Not hard at all  Food Insecurity: No Food Insecurity (06/08/2022)   Hunger Vital Sign    Worried About Running Out of Food in the Last Year:  Never true    Ran Out of Food in the Last Year: Never true  Transportation Needs: No Transportation Needs (06/08/2022)   PRAPARE - Administrator, Civil Service (Medical): No    Lack of Transportation (Non-Medical): No  Physical Activity: Insufficiently Active (06/03/2022)   Exercise Vital Sign    Days of Exercise per Week: 3 days    Minutes of Exercise per Session: 30 min  Stress: No Stress Concern Present (06/03/2022)   [employer] of Occupational Health - Occupational Stress Questionnaire    Feeling of Stress : Only a little  Social Connections: Socially Integrated (06/03/2022)   Social Connection and Isolation Panel [NHANES]    Frequency of Communication with Friends and Family: More than three times a week    Frequency of Social Gatherings with Friends and Family: Twice a week    Attends Religious Services: More than 4 times per year    Active Member of [employer] or Organizations: Yes    Attends Banker Meetings: 1 to 4 times per year    Marital Status: Married  Catering manager Violence: Not At Risk (06/03/2022)   Humiliation, Afraid, Rape, and Kick questionnaire    Fear of Current or Ex-Partner: No    Emotionally Abused: No    Physically Abused: No    Sexually Abused: No    Past Medical History, Surgical history, Social history, and Family history were reviewed and updated as appropriate.   Please see review of systems for further details on the patient's review from today.   Objective:   Physical Exam:  There were no vitals taken for this visit.  Physical Exam Neurological:     Mental Status: He is alert and oriented to person, place, and time.     Cranial Nerves: No dysarthria.  Psychiatric:        Attention and Perception: Attention and perception normal.        Mood and Affect: Mood is anxious and depressed.        Behavior: Behavior is cooperative.        Thought Content: Thought content normal. Thought content is not paranoid or delusional.  Thought content does not include homicidal or suicidal ideation. Thought content does not include suicidal plan.        Cognition and Memory: Cognition and memory normal.        Judgment: Judgment normal.     Comments: Insight intact Voice change with PD     Lab Review:     Component Value Date/Time   NA 130 (L) 05/26/2022  1625   NA 143 07/10/2015 0821   K 4.4 05/26/2022 1625   CL 92 (L) 05/26/2022 1625   CO2 29 05/26/2022 1625   GLUCOSE 92 05/26/2022 1625   BUN 22 05/26/2022 1625   BUN 13 07/10/2015 0821   CREATININE 0.67 (L) 05/26/2022 1625   CALCIUM 9.4 05/26/2022 1625   PROT 6.6 05/26/2022 1625   PROT 6.8 07/10/2015 0821   ALBUMIN 4.1 03/24/2021 1351   ALBUMIN 4.2 07/10/2015 0821   AST 19 05/26/2022 1625   ALT 11 05/26/2022 1625   ALKPHOS 51 03/24/2021 1351   BILITOT 0.5 05/26/2022 1625   BILITOT 0.5 07/10/2015 0821   GFRNONAA >60 03/24/2021 1351   GFRNONAA 86 09/07/2019 0941   GFRAA 100 09/07/2019 0941       Component Value Date/Time   WBC 5.6 10/10/2021 0843   RBC 4.42 10/10/2021 0843   HGB 14.5 10/10/2021 0843   HGB 15.3 10/23/2014 1237   HCT 42.3 10/10/2021 0843   HCT 45.1 10/23/2014 1237   PLT 193 10/10/2021 0843   PLT 150 10/23/2014 1237   MCV 95.7 10/10/2021 0843   MCV 88 10/23/2014 1237   MCH 32.8 10/10/2021 0843   MCHC 34.3 10/10/2021 0843   RDW 11.7 10/10/2021 0843   RDW 13.3 10/23/2014 1237   LYMPHSABS 1,210 10/10/2021 0843   LYMPHSABS 1.7 10/23/2014 1237   MONOABS 357 07/24/2016 1050   EOSABS 258 10/10/2021 0843   EOSABS 0.3 10/23/2014 1237   BASOSABS 62 10/10/2021 0843   BASOSABS 0.1 10/23/2014 1237    No results found for: "POCLITH", "LITHIUM"   No results found for: "PHENYTOIN", "PHENOBARB", "VALPROATE", "CBMZ"   .res Assessment: Plan:    Joseph Arroyo was seen today for follow-up, depression and anxiety.  Diagnoses and all orders for this visit:  GAD (generalized anxiety disorder)  Major depressive disorder, recurrent episode,  moderate (HCC)  Chronic pain syndrome    History of extremely severe TR depression and anxiety with multiple med failures.  Doing well for 2-3 years.    Unfortunately he has had a severe relapse of anxiety over the last several weeks.  It is contributed to by pain and advancing Parkinson's disease.  He has been consistent with the paroxetine  Still works out at gym.  Disc pricing L-methylfolate with [facility]. Continue paroxetine 60 mg daily Hesitate to increase paroxetine right now   Parkiinson's is better with meds. But not gread. DT PD hesitate to use atypicals for TR anxiety.  Consider nortriptyline but this is more risky DT age.    Continue gabapentin for anxiety Arroyo label and pain 200 mg BID  Ok Xanax 0.25 mg TID prn.  He's not using much, usually only 1 daily.   We discussed the short-term risks associated with benzodiazepines including sedation and increased fall risk among others.  Discussed long-term side effect risk including dependence, potential withdrawal symptoms, and the potential eventual dose-related risk of dementia.  But recent studies from 2020 dispute this association between benzodiazepines and dementia risk. Newer studies in 2020 do not support an association with dementia.  Trial DC clonidine and if anxiety is worse then resume Clonidine 0.1 mg tablets 1/2 tablet nightly Disc SE incl fall risk.  Push fluids.  Fall precautions.  This is Arroyo label but often provides fairly rapid relief of anxiety.  He has alprazolam but it does not provide adequate control.  He's always had low BP.    Tolerating and benefitting.' Disc fall risks.  Answered questions about Vitamin  D.  He's taking 4000 units  No other med changes  FU 3-4 mos  Joseph Staggers, MD, DFAPA   Please see After Visit Summary for patient specific instructions.  Future Appointments  Date Time Provider Department Center  07/07/2022  1:45 PM [facility] CASE MANAGER [facility]    No orders of the  defined types were placed in this encounter.    -------------------------------

## 2022-07-02 ENCOUNTER — Ambulatory Visit: Payer: Medicare HMO | Admitting: Physical Therapy

## 2022-07-06 ENCOUNTER — Ambulatory Visit: Payer: Medicare HMO | Admitting: Physical Therapy

## 2022-07-07 ENCOUNTER — Telehealth: Payer: Self-pay | Admitting: Pain Medicine

## 2022-07-07 ENCOUNTER — Ambulatory Visit (INDEPENDENT_AMBULATORY_CARE_PROVIDER_SITE_OTHER): Payer: Medicare HMO

## 2022-07-07 ENCOUNTER — Telehealth: Payer: Medicare HMO

## 2022-07-07 DIAGNOSIS — G20A2 Parkinson's disease without dyskinesia, with fluctuations: Secondary | ICD-10-CM

## 2022-07-07 DIAGNOSIS — G20B2 Parkinson's disease with dyskinesia, with fluctuations: Secondary | ICD-10-CM

## 2022-07-07 DIAGNOSIS — F411 Generalized anxiety disorder: Secondary | ICD-10-CM

## 2022-07-07 DIAGNOSIS — M47817 Spondylosis without myelopathy or radiculopathy, lumbosacral region: Secondary | ICD-10-CM

## 2022-07-07 DIAGNOSIS — G8929 Other chronic pain: Secondary | ICD-10-CM

## 2022-07-07 DIAGNOSIS — M545 Low back pain, unspecified: Secondary | ICD-10-CM

## 2022-07-07 DIAGNOSIS — M4186 Other forms of scoliosis, lumbar region: Secondary | ICD-10-CM

## 2022-07-07 DIAGNOSIS — G894 Chronic pain syndrome: Secondary | ICD-10-CM

## 2022-07-07 DIAGNOSIS — F419 Anxiety disorder, unspecified: Secondary | ICD-10-CM

## 2022-07-07 NOTE — Chronic Care Management (AMB) (Signed)
Chronic Care Management   CCM RN Visit Note  07/07/2022 Name: Joseph Arroyo MRN: [id number] DOB: 04/18/1940  Subjective: Joseph Arroyo is a 82 y.o. year old male who is a primary care patient of Joseph Cords, DO. The patient was referred to the Chronic Care Management team for assistance with care management needs subsequent to provider initiation of CCM services and plan of care.    Today's Visit:  Engaged with patient by telephone for follow up visit.        Goals Addressed             This Visit's Progress    CCM Expected Outcome:  Monitor, Self-Manage and Reduce Symptoms of: Anxiety       Current Barriers:  Care Coordination needs related to social worker support, social work referral pending  in a patient with anxiety Chronic Disease Management support and education needs related to effective management of anxiety  Planned Interventions: Evaluation of current treatment plan related to anxiety and patient's adherence to plan as established by provider. The patient has good days and bad days. Works with the [facility] specialist on a regular basis. Talked with the RNCM today with his wife on the phone. He likes to listen to worship music when he is anxious and sometimes eating helps also. He use to do a lot of writing but cannot do that as well due to his PD. He states that his faith keeps him strong and he got a little emotional on the phone. Reflective listening and support given.  Provided education to patient re: call the office for changes in mood, anxiety, depression, or mental health needs  Reviewed medications with patient and discussed compliance. The patient is compliant with medications. Takes as prescribed Social Work referral for ongoing support and education for anxiety and mental health needs. Ongoing support and education given from the LCSW.  Discussed plans with patient for ongoing care management follow up and provided patient with direct contact  information for care management team Advised patient to discuss changes in his mood, anxiety, depression, or mental health  with provider Screening for signs and symptoms of depression related to chronic disease state  Assessed social determinant of health barriers  Symptom Management: Take medications as prescribed   Attend all scheduled provider appointments Call provider office for new concerns or questions  call the Suicide and Crisis Lifeline: 988 call the [place] National Suicide Prevention Lifeline: 601-513-1788 or TTY: 7312269182 TTY 301-866-0971) to talk to a trained counselor call 1-800-273-TALK (toll free, 24 hour hotline) if experiencing a Mental Health or Behavioral Health Crisis   Follow Up Plan: Telephone follow up appointment with care management team member scheduled for: 09-01-2022 at 145 pm       CCM Expected Outcome:  Monitor, Self-Manage and Reduce Symptoms of: Arthritis Spine, Back Pain       Current Barriers:  Care Coordination needs related to support and education for effective pain management  in a patient with Chronic pain and arthritis  Chronic Disease Management support and education needs related to effective management of chronic pain, arthritis, and scoliosis   Planned Interventions: Reviewed provider established plan for pain management. Currently the patient is stable with his pain management. He goes to the pain clinic but also is followed closely by pcp. The patients wife knows to call for acute changes. Had a procedure recently on his back and has adequate pain medications. Will have another procedure in June on his lower back. The  patient does not want to get weak. Discussed exercises he can do while sitting in a chair or stretches he can do also. Education on pacing activity and being careful. Discussed the cubii device to use to help with leg and muscle strength. Has worked with PT/OT before. Has good support from his wife and family Discussed importance  of adherence to all scheduled medical appointments; Counseled on the importance of reporting any/all new or changed pain symptoms or management strategies to pain management provider; Advised patient to report to care team affect of pain on daily activities; Discussed use of relaxation techniques and/or diversional activities to assist with pain reduction (distraction, imagery, relaxation, massage, acupressure, TENS, heat, and cold application; Reviewed with patient prescribed pharmacological and nonpharmacological pain relief strategies; Advised patient to discuss changes in level or intensity of pain, unresolved pain, questions and concerns with provider; Screening for signs and symptoms of depression related to chronic disease state;  Assessed social determinant of health barriers;  Review of fall prevention and safety concerns  Symptom Management: Take medications as prescribed   Attend all scheduled provider appointments Call provider office for new concerns or questions  call the Suicide and Crisis Lifeline: 988 call the [place] National Suicide Prevention Lifeline: 867-664-0804 or TTY: (616)587-1182 TTY (347) 366-0427) to talk to a trained counselor call 1-800-273-TALK (toll free, 24 hour hotline) if experiencing a Mental Health or Behavioral Health Crisis   Follow Up Plan: Telephone follow up appointment with care management team member scheduled for: 09-01-2022 at 145 pm       CCM Expected Outcome:  Monitor, Self-Manage and Reduce Symptoms of: Parkinson's       Current Barriers:  Knowledge Deficits related to resources in the area for support in meeting the needs of the patient as his Parkinson's advances and needs change  Care Coordination needs related to support from the CCM team, education needs, and ongoing support for effective management of Parkinson's and other chronic conditions in a patient with Parkinson's Disease  Chronic Disease Management support and education needs related  to effective management of PD  Planned Interventions: Evaluation of current treatment plan related to PD and patient's adherence to plan as established by provider. The patient sees the neurosurgeon again in August. The patient did ask about his big toes sometimes being pointed outwards. This has been going on for a few weeks. May be related to his PD. Will continue to monitor for changes. Advised patient to call the [facility] after talking to the insurance and discussing with her husband if they want the pcp to order the hospital bed. His wife did check on some beds but states that there are so many different ones to choose from. Encouraged her to go to the store and talk to the associates to figure out the bed that would be best for the patient. She will do this. She wants to continue to hold off on the pcp ordering a new bed at this time. Will continue to monitor.  Provided education to patient re: the CCM team, resources available, support for the patient, information about myChart: The patients wife wishes for the link to be sent to her email at [email or web address]. This has been sent today. The patients wife also advised the need to provide a DPR for the patient for the future of speaking to the team. The patient gave permission for the [facility] to speak to his wife [name].  Reviewed medications with patient and discussed compliance. The patient currently does not have a  lot of medications and his wife states she is handling them well. Review of pharm D support if needed.  Collaborated with pcp and LCSW for current expressed needs  regarding PD and advancement of PD and possible future needs. Has spoke with the LCSW and knows how to reach for new questions or concerns Provided patient with PD, falls and safety, and Air cabin crew related to resources to help with effective management of PD Social Work referral for education, support, and long term planning for the patient needs with  the advancement of his PD Discussed plans with patient for ongoing care management follow up and provided patient with direct contact information for care management team Advised patient to discuss changes in PD, questions or concerns with provider Screening for signs and symptoms of depression related to chronic disease state  Assessed social determinant of health barriers Encouraged the patients wife to get a notebook dedicated to the patients health needs and write down questions as she or the patient thinks of questions in regard to DME needs, changes in conditions, medication questions, and other information important to meeting the needs of the patient.  The patient currently receives meals from [employer], has a tub bench and rails in the bathroom, frame for over the commode, and a ramp. The wife is going to call insurance about a hospital bed and let the RNCM know the desires of the patient.  Other resource that may be helpful for the patient is [facility] in [place] at (616)[phone].   Symptom Management: Take medications as prescribed   Attend all scheduled provider appointments Call provider office for new concerns or questions  call the Suicide and Crisis Lifeline: 988 call the [place] National Suicide Prevention Lifeline: 971-641-7639 or TTY: 657-065-0511 TTY [phone]) to talk to a trained counselor call 1-800-273-TALK (toll free, 24 hour hotline) if experiencing a Mental Health or Behavioral Health Crisis   Follow Up Plan: Telephone follow up appointment with care management team member scheduled for: 09-01-2022 at 145 pm          Plan:Telephone follow up appointment with care management team member scheduled for:  09-01-2022 at 145 pm   Alto Denver RN, MSN, CCM RN Care Manager  Chronic Care Management Direct Number: [phone]

## 2022-07-07 NOTE — Telephone Encounter (Signed)
Having concerns about continued lower back pain following RFA. Discussed in length the post RFA expectations. Patient verbalized understanding, concerns alleviated.

## 2022-07-07 NOTE — Patient Instructions (Signed)
Please call the care guide team at [phone] if you need to cancel or reschedule your appointment.   If you are experiencing a Mental Health or Behavioral Health Crisis or need someone to talk to, please call the Suicide and Crisis Lifeline: 988 call the [place] National Suicide Prevention Lifeline: (432)151-8311 or TTY: (272) 238-9542 TTY 831-634-6034) to talk to a trained counselor call 1-800-273-TALK (toll free, 24 hour hotline)   Following is a copy of the CCM Program Consent:  CCM service includes personalized support from designated clinical staff supervised by the physician, including individualized plan of care and coordination with other care providers 24/7 contact phone numbers for assistance for urgent and routine care needs. Service will only be billed when office clinical staff spend 20 minutes or more in a month to coordinate care. Only one practitioner may furnish and bill the service in a calendar month. The patient may stop CCM services at amy time (effective at the end of the month) by phone call to the office staff. The patient will be responsible for cost sharing (co-pay) or up to 20% of the service fee (after annual deductible is met)  Following is a copy of your full provider care plan:   Goals Addressed             This Visit's Progress    CCM Expected Outcome:  Monitor, Self-Manage and Reduce Symptoms of: Anxiety       Current Barriers:  Care Coordination needs related to social worker support, social work referral pending  in a patient with anxiety Chronic Disease Management support and education needs related to effective management of anxiety  Planned Interventions: Evaluation of current treatment plan related to anxiety and patient's adherence to plan as established by provider. The patient has good days and bad days. Works with the [facility] specialist on a regular basis. Talked with the RNCM today with his wife on the phone. He likes to listen to worship music when  he is anxious and sometimes eating helps also. He use to do a lot of writing but cannot do that as well due to his PD. He states that his faith keeps him strong and he got a little emotional on the phone. Reflective listening and support given.  Provided education to patient re: call the office for changes in mood, anxiety, depression, or mental health needs  Reviewed medications with patient and discussed compliance. The patient is compliant with medications. Takes as prescribed Social Work referral for ongoing support and education for anxiety and mental health needs. Ongoing support and education given from the LCSW.  Discussed plans with patient for ongoing care management follow up and provided patient with direct contact information for care management team Advised patient to discuss changes in his mood, anxiety, depression, or mental health  with provider Screening for signs and symptoms of depression related to chronic disease state  Assessed social determinant of health barriers  Symptom Management: Take medications as prescribed   Attend all scheduled provider appointments Call provider office for new concerns or questions  call the Suicide and Crisis Lifeline: 988 call the [place] National Suicide Prevention Lifeline: 567-169-0522 or TTY: 737-014-9991 TTY 209 146 1624) to talk to a trained counselor call 1-800-273-TALK (toll free, 24 hour hotline) if experiencing a Mental Health or Behavioral Health Crisis   Follow Up Plan: Telephone follow up appointment with care management team member scheduled for: 09-01-2022 at 145 pm       CCM Expected Outcome:  Monitor, Self-Manage and Reduce Symptoms  of: Arthritis Spine, Back Pain       Current Barriers:  Care Coordination needs related to support and education for effective pain management  in a patient with Chronic pain and arthritis  Chronic Disease Management support and education needs related to effective management of chronic pain,  arthritis, and scoliosis   Planned Interventions: Reviewed provider established plan for pain management. Currently the patient is stable with his pain management. He goes to the pain clinic but also is followed closely by pcp. The patients wife knows to call for acute changes. Had a procedure recently on his back and has adequate pain medications. Will have another procedure in June on his lower back. The patient does not want to get weak. Discussed exercises he can do while sitting in a chair or stretches he can do also. Education on pacing activity and being careful. Discussed the cubii device to use to help with leg and muscle strength. Has worked with PT/OT before. Has good support from his wife and family Discussed importance of adherence to all scheduled medical appointments; Counseled on the importance of reporting any/all new or changed pain symptoms or management strategies to pain management provider; Advised patient to report to care team affect of pain on daily activities; Discussed use of relaxation techniques and/or diversional activities to assist with pain reduction (distraction, imagery, relaxation, massage, acupressure, TENS, heat, and cold application; Reviewed with patient prescribed pharmacological and nonpharmacological pain relief strategies; Advised patient to discuss changes in level or intensity of pain, unresolved pain, questions and concerns with provider; Screening for signs and symptoms of depression related to chronic disease state;  Assessed social determinant of health barriers;  Review of fall prevention and safety concerns  Symptom Management: Take medications as prescribed   Attend all scheduled provider appointments Call provider office for new concerns or questions  call the Suicide and Crisis Lifeline: 988 call the [place] National Suicide Prevention Lifeline: (434)010-5821 or TTY: 8590396502 TTY 925-349-8272) to talk to a trained counselor call  1-800-273-TALK (toll free, 24 hour hotline) if experiencing a Mental Health or Behavioral Health Crisis   Follow Up Plan: Telephone follow up appointment with care management team member scheduled for: 09-01-2022 at 145 pm       CCM Expected Outcome:  Monitor, Self-Manage and Reduce Symptoms of: Parkinson's       Current Barriers:  Knowledge Deficits related to resources in the area for support in meeting the needs of the patient as his Parkinson's advances and needs change  Care Coordination needs related to support from the CCM team, education needs, and ongoing support for effective management of Parkinson's and other chronic conditions in a patient with Parkinson's Disease  Chronic Disease Management support and education needs related to effective management of PD  Planned Interventions: Evaluation of current treatment plan related to PD and patient's adherence to plan as established by provider. The patient sees the neurosurgeon again in August. The patient did ask about his big toes sometimes being pointed outwards. This has been going on for a few weeks. May be related to his PD. Will continue to monitor for changes. Advised patient to call the [facility] after talking to the insurance and discussing with her husband if they want the pcp to order the hospital bed. His wife did check on some beds but states that there are so many different ones to choose from. Encouraged her to go to the store and talk to the associates to figure out the bed that would  be best for the patient. She will do this. She wants to continue to hold off on the pcp ordering a new bed at this time. Will continue to monitor.  Provided education to patient re: the CCM team, resources available, support for the patient, information about myChart: The patients wife wishes for the link to be sent to her email at [email or web address]. This has been sent today. The patients wife also advised the need to provide a DPR for the patient  for the future of speaking to the team. The patient gave permission for the [facility] to speak to his wife [name].  Reviewed medications with patient and discussed compliance. The patient currently does not have a lot of medications and his wife states she is handling them well. Review of pharm D support if needed.  Collaborated with pcp and LCSW for current expressed needs  regarding PD and advancement of PD and possible future needs. Has spoke with the LCSW and knows how to reach for new questions or concerns Provided patient with PD, falls and safety, and Air cabin crew related to resources to help with effective management of PD Social Work referral for education, support, and long term planning for the patient needs with the advancement of his PD Discussed plans with patient for ongoing care management follow up and provided patient with direct contact information for care management team Advised patient to discuss changes in PD, questions or concerns with provider Screening for signs and symptoms of depression related to chronic disease state  Assessed social determinant of health barriers Encouraged the patients wife to get a notebook dedicated to the patients health needs and write down questions as she or the patient thinks of questions in regard to DME needs, changes in conditions, medication questions, and other information important to meeting the needs of the patient.  The patient currently receives meals from [employer], has a tub bench and rails in the bathroom, frame for over the commode, and a ramp. The wife is going to call insurance about a hospital bed and let the RNCM know the desires of the patient.  Other resource that may be helpful for the patient is [facility] in [place] at (979) [phone].   Symptom Management: Take medications as prescribed   Attend all scheduled provider appointments Call provider office for new concerns or questions  call  the Suicide and Crisis Lifeline: 988 call the [place] National Suicide Prevention Lifeline: 9393892665 or TTY: 405-700-3698 TTY 5197269975) to talk to a trained counselor call 1-800-273-TALK (toll free, 24 hour hotline) if experiencing a Mental Health or Behavioral Health Crisis   Follow Up Plan: Telephone follow up appointment with care management team member scheduled for: 09-01-2022 at 145 pm          Patient verbalizes understanding of instructions and care plan provided today and agrees to view in MyChart. Active MyChart status and patient understanding of how to access instructions and care plan via MyChart confirmed with patient.  Telephone follow up appointment with care management team member scheduled for: 09-01-2022 at 145 pm

## 2022-07-07 NOTE — Telephone Encounter (Signed)
PT wife called stated that patient is having some issues since the procedure. They have some questions. Please give patient . TY

## 2022-07-08 ENCOUNTER — Ambulatory Visit: Payer: Medicare HMO | Admitting: Physical Therapy

## 2022-07-08 ENCOUNTER — Other Ambulatory Visit: Payer: Self-pay | Admitting: Psychiatry

## 2022-07-08 DIAGNOSIS — F411 Generalized anxiety disorder: Secondary | ICD-10-CM

## 2022-07-08 DIAGNOSIS — F3342 Major depressive disorder, recurrent, in full remission: Secondary | ICD-10-CM

## 2022-07-13 ENCOUNTER — Ambulatory Visit: Payer: Medicare HMO | Admitting: Physical Therapy

## 2022-07-15 ENCOUNTER — Ambulatory Visit: Payer: Medicare HMO | Admitting: Physical Therapy

## 2022-07-20 ENCOUNTER — Ambulatory Visit: Payer: Medicare HMO | Admitting: Physical Therapy

## 2022-07-22 ENCOUNTER — Ambulatory Visit: Payer: Medicare HMO | Admitting: Physical Therapy

## 2022-07-25 ENCOUNTER — Other Ambulatory Visit: Payer: Self-pay | Admitting: Psychiatry

## 2022-07-25 DIAGNOSIS — F411 Generalized anxiety disorder: Secondary | ICD-10-CM

## 2022-07-26 NOTE — Telephone Encounter (Signed)
Trial DC clonidine and if anxiety is worse then resume Clonidine 0.1 mg tablets 1/2 tablet nightly. Is he still taking?

## 2022-07-28 ENCOUNTER — Ambulatory Visit: Payer: Medicare HMO | Admitting: Physical Therapy

## 2022-07-28 ENCOUNTER — Telehealth: Payer: Self-pay | Admitting: Psychiatry

## 2022-07-28 DIAGNOSIS — F411 Generalized anxiety disorder: Secondary | ICD-10-CM

## 2022-07-28 DIAGNOSIS — F331 Major depressive disorder, recurrent, moderate: Secondary | ICD-10-CM

## 2022-07-28 NOTE — Telephone Encounter (Signed)
Please print out a RX for L-Methylfolate for patient. They would like it mailed to them. They use [facility].

## 2022-07-29 ENCOUNTER — Telehealth: Payer: Self-pay

## 2022-07-29 ENCOUNTER — Telehealth: Payer: Self-pay | Admitting: Psychiatry

## 2022-07-29 MED ORDER — L-METHYLFOLATE 15 MG PO TABS
15.0000 mg | ORAL_TABLET | Freq: Every day | ORAL | 3 refills | Status: DC
Start: 2022-07-29 — End: 2023-07-28

## 2022-07-29 NOTE — Telephone Encounter (Signed)
Mailed written Rx as requested

## 2022-07-29 NOTE — Telephone Encounter (Signed)
Printed and put in Dr. Alwyn Ren box.

## 2022-07-29 NOTE — Telephone Encounter (Signed)
The patient fell in the bathroom Saturday. The ribs are doing better but he hit his back also. They just wanted Dr. Laban Emperor to be aware. They are planning on going on with the procedure on Tuesday.

## 2022-07-31 DIAGNOSIS — M47817 Spondylosis without myelopathy or radiculopathy, lumbosacral region: Secondary | ICD-10-CM

## 2022-08-03 ENCOUNTER — Telehealth: Payer: Self-pay | Admitting: *Deleted

## 2022-08-03 NOTE — Telephone Encounter (Signed)
Joseph Arroyo returned my call.  Patient was in the bathroom and lost his balance and fell.  Hit his ribs on the left.  Did not hit his head.  Bump to his low back.  Did not seek medical attention, patient did not feel it was warranted.  Joseph Arroyo reports that he is doing better since fall.  Ribs are still sore when getting up.  Otherwise Joseph Arroyo reports that he is doing okay and would like to proceed with procedure for tomorrow.

## 2022-08-03 NOTE — Telephone Encounter (Signed)
Contacted patient per FN request.  Had to leave a voicemail.  FN would like to know if he sought medical attention after his fall on Saturday and how he is doing now.  Again, voicemail left asking Joseph Arroyo to please return the phone call.

## 2022-08-03 NOTE — Progress Notes (Addendum)
PROVIDER NOTE: Interpretation of information contained herein should be left to medically-trained personnel. Specific patient instructions are provided elsewhere under "Patient Instructions" section of medical record. This document was created in part using STT-dictation technology, any transcriptional errors that may result from this process are unintentional.  Patient: Joseph Arroyo Type: Established DOB: [birth year] MRN: [id number] PCP: Smitty Cords, DO  Service: Procedure DOS: 08/04/2022 Setting: Ambulatory Location: Ambulatory outpatient facility Delivery: Face-to-face Provider: Oswaldo Done, MD Specialty: Interventional Pain Management Specialty designation: 09 Location: Outpatient facility Ref. Prov.: Saralyn Pilar *       Interventional Therapy   Procedure: Lumbar Facet, Medial Branch Radiofrequency Ablation (RFA) #1  Laterality: Left (-LT)  Level: L1, L2, L3, and L4 Medial Branch Level(s). These levels will denervate the L2-3 and L3-4 lumbar facet joints.  Imaging: Fluoroscopy-guided         Anesthesia: Local anesthesia (1-2% Lidocaine) Anxiolysis: IV Versed 1.0 mg Sedation: Moderate Sedation None required. No Fentanyl administered.         DOS: 08/04/2022  Performed by: Oswaldo Done, MD  Purpose: Therapeutic/Palliative Indications: Low back pain severe enough to impact quality of life or function. Indications: 1. Lumbar facet syndrome (Bilateral)   2. Lumbar facet joint pain   3. Spondylosis without myelopathy or radiculopathy, lumbosacral region   4. Grade 1 Retrolisthesis of L2/L3 (5 mm) and L3/L4 (3 mm)   5. Lumbosacral facet arthropathy (Left: L3-4, L4-5, and L5-S1)   6. Lumbosacral facet joint hypertrophy   7. Chronic low back pain (1ry area of Pain) (Bilateral) (R>L) w/o sciatica   8. Osteoarthritis of facet joint of lumbar spine   9. DDD (degenerative disc disease), lumbosacral   10. Parkinson's disease with dyskinesia,  unspecified whether manifestations fluctuate    Joseph Arroyo has been dealing with the above chronic pain for longer than three months and has either failed to respond, was unable to tolerate, or simply did not get enough benefit from other more conservative therapies including, but not limited to: 1. Over-the-counter medications 2. Anti-inflammatory medications 3. Muscle relaxants 4. Membrane stabilizers 5. Opioids 6. Physical therapy and/or chiropractic manipulation 7. Modalities (Heat, ice, etc.) 8. Invasive techniques such as nerve blocks. Joseph Arroyo has attained more than 50% relief of the pain from a series of diagnostic injections conducted in separate occasions.  Pain Score: Pre-procedure: 5 /10 Post-procedure: 0-No pain/10     Position / Prep / Materials:  Position: Prone  Prep solution: DuraPrep (Iodine Povacrylex [0.7% available iodine] and Isopropyl Alcohol, 74% w/w) Prep Area: Entire Lumbosacral Region (Lower back from mid-thoracic region to end of tailbone and from flank to flank.) Materials:  Tray: RFA (Radiofrequency) tray Needle(s):  Type: RFA (Teflon-coated radiofrequency ablation needles) Gauge (G): 22  Length: Regular (10cm) Qty: 4      Post-procedure evaluation   Procedure: Lumbar Facet, Medial Branch Radiofrequency Ablation (RFA) #1  Laterality: Right (-RT)  Level:  L1, L2, L3, and L4 Medial Branch Level(s). These levels will denervate the  L2-3, and L3-4 lumbar facet joints.  Imaging: Fluoroscopy-guided         Anesthesia: Local anesthesia (1-2% Lidocaine) Anxiolysis: IV Versed 1.5 mg Sedation: Moderate Sedation Fentanyl 0.5 mL (25 mcg) DOS: 06/25/2022  Performed by: Oswaldo Done, MD  Purpose: Therapeutic/Palliative Indications: Low back pain severe enough to impact quality of life or function.  Pain Score: Pre-procedure: 7 /10 Post-procedure: 0-No pain/10     Effectiveness:  Initial hour after procedure: 50 %. Subsequent 4-6  hours  post-procedure: 50 %. Analgesia past initial 6 hours:  ("some better").  Pre-op H&P Assessment:  Joseph Arroyo is a 82 y.o. (year old), male patient, seen today for interventional treatment. He  has a past surgical history that includes Other surgical history (2002); Colonoscopy (2008); Stapedes surgery (Right, 2002); Cataract extraction (Right, 07/18/14); and Cataract extraction w/PHACO (Left, 08/01/2014). Joseph Arroyo has a current medication list which includes the following prescription(s): acetaminophen, alprazolam, b complex vitamins, baclofen, capsicum-garlic, carbidopa-levodopa, gabapentin, hawthorn, hydrocodone-acetaminophen, [START ON 08/11/2022] hydrocodone-acetaminophen, l-methylfolate, lutein, paroxetine, paroxetine, and saw palmetto (serenoa repens), and the following Facility-Administered Medications: fentanyl and lactated ringers. His primarily concern today is the Back Pain (Left, lower)  Initial Vital Signs:  Pulse/HCG Rate: (!) 58ECG Heart Rate: (!) 57 (SB) Temp: (!) 97.3 F (36.3 C) Resp: 18 BP: 103/60 SpO2: 99 %  BMI: Estimated body mass index is 20.98 kg/m as calculated from the following:   Height as of this encounter: 5\' 6"  (1.676 m).   Weight as of this encounter: 130 lb (59 kg).  Risk Assessment: Allergies: Reviewed. He is allergic to prednisone and wheat.  Allergy Precautions: None required Coagulopathies: Reviewed. None identified.  Blood-thinner therapy: None at this time Active Infection(s): Reviewed. None identified. Joseph Arroyo is afebrile  Site Confirmation: Joseph Arroyo was asked to confirm the procedure and laterality before marking the site Procedure checklist: Completed Consent: Before the procedure and under the influence of no sedative(s), amnesic(s), or anxiolytics, the patient was informed of the treatment options, risks and possible complications. To fulfill our ethical and legal obligations, as recommended by the American Medical  Association's Code of Ethics, I have informed the patient of my clinical impression; the nature and purpose of the treatment or procedure; the risks, benefits, and possible complications of the intervention; the alternatives, including doing nothing; the risk(s) and benefit(s) of the alternative treatment(s) or procedure(s); and the risk(s) and benefit(s) of doing nothing. The patient was provided information about the general risks and possible complications associated with the procedure. These may include, but are not limited to: failure to achieve desired goals, infection, bleeding, organ or nerve damage, allergic reactions, paralysis, and death. In addition, the patient was informed of those risks and complications associated to Spine-related procedures, such as failure to decrease pain; infection (i.e.: Meningitis, epidural or intraspinal abscess); bleeding (i.e.: epidural hematoma, subarachnoid hemorrhage, or any other type of intraspinal or peri-dural bleeding); organ or nerve damage (i.e.: Any type of peripheral nerve, nerve root, or spinal cord injury) with subsequent damage to sensory, motor, and/or autonomic systems, resulting in permanent pain, numbness, and/or weakness of one or several areas of the body; allergic reactions; (i.e.: anaphylactic reaction); and/or death. Furthermore, the patient was informed of those risks and complications associated with the medications. These include, but are not limited to: allergic reactions (i.e.: anaphylactic or anaphylactoid reaction(s)); adrenal axis suppression; blood sugar elevation that in diabetics may result in ketoacidosis or comma; water retention that in patients with history of congestive heart failure may result in shortness of breath, pulmonary edema, and decompensation with resultant heart failure; weight gain; swelling or edema; medication-induced neural toxicity; particulate matter embolism and blood vessel occlusion with resultant organ, and/or  nervous system infarction; and/or aseptic necrosis of one or more joints. Finally, the patient was informed that Medicine is not an exact science; therefore, there is also the possibility of unforeseen or unpredictable risks and/or possible complications that may result in a catastrophic outcome. The patient indicated having understood very clearly. We have  given the patient no guarantees and we have made no promises. Enough time was given to the patient to ask questions, all of which were answered to the patient's satisfaction. Joseph Arroyo has indicated that he wanted to continue with the procedure. Attestation: I, the ordering provider, attest that I have discussed with the patient the benefits, risks, side-effects, alternatives, likelihood of achieving goals, and potential problems during recovery for the procedure that I have provided informed consent. Date  Time: 08/04/2022  8:15 AM   Pre-Procedure Preparation:  Monitoring: As per clinic protocol. Respiration, ETCO2, SpO2, BP, heart rate and rhythm monitor placed and checked for adequate function Safety Precautions: Patient was assessed for positional comfort and pressure points before starting the procedure. Time-out: I initiated and conducted the "Time-out" before starting the procedure, as per protocol. The patient was asked to participate by confirming the accuracy of the "Time Out" information. Verification of the correct person, site, and procedure were performed and confirmed by me, the nursing staff, and the patient. "Time-out" conducted as per Joint Commission's Universal Protocol (UP.01.01.01). Time: 0901 Start Time: 0901 hrs.  Description of Procedure:          Laterality: See above. Levels:  See above. Safety Precautions: Aspiration looking for blood return was conducted prior to all injections. At no point did we inject any substances, as a needle was being advanced. Before injecting, the patient was told to immediately notify me  if he was experiencing any new onset of "ringing in the ears, or metallic taste in the mouth". No attempts were made at seeking any paresthesias. Safe injection practices and needle disposal techniques used. Medications properly checked for expiration dates. SDV (single dose vial) medications used. After the completion of the procedure, all disposable equipment used was discarded in the proper designated medical waste containers. Local Anesthesia: Protocol guidelines were followed. The patient was positioned over the fluoroscopy table. The area was prepped in the usual manner. The time-out was completed. The target area was identified using fluoroscopy. A 12-in long, straight, sterile hemostat was used with fluoroscopic guidance to locate the targets for each level blocked. Once located, the skin was marked with an approved surgical skin marker. Once all sites were marked, the skin (epidermis, dermis, and hypodermis), as well as deeper tissues (fat, connective tissue and muscle) were infiltrated with a small amount of a short-acting local anesthetic, loaded on a 10cc syringe with a 25G, 1.5-in  Needle. An appropriate amount of time was allowed for local anesthetics to take effect before proceeding to the next step. Technical description of process:  Radiofrequency Ablation (RFA) L1 Medial Branch Nerve RFA: The target area for the L1 medial branch is at the junction of the postero-lateral aspect of the superior articular process and the superior, posterior, and medial edge of the transverse process of L2. Under fluoroscopic guidance, a Radiofrequency needle was inserted until contact was made with os over the superior postero-lateral aspect of the pedicular shadow (target area). Sensory and motor testing was conducted to properly adjust the position of the needle. Once satisfactory placement of the needle was achieved, the numbing solution was slowly injected after negative aspiration for blood. 2.0 mL of the  nerve block solution was injected without difficulty or complication. After waiting for at least 3 minutes, the ablation was performed. Once completed, the needle was removed intact. L2 Medial Branch Nerve RFA: The target area for the L2 medial branch is at the junction of the postero-lateral aspect of the superior articular  process and the superior, posterior, and medial edge of the transverse process of L3. Under fluoroscopic guidance, a Radiofrequency needle was inserted until contact was made with os over the superior postero-lateral aspect of the pedicular shadow (target area). Sensory and motor testing was conducted to properly adjust the position of the needle. Once satisfactory placement of the needle was achieved, the numbing solution was slowly injected after negative aspiration for blood. 2.0 mL of the nerve block solution was injected without difficulty or complication. After waiting for at least 3 minutes, the ablation was performed. Once completed, the needle was removed intact. L3 Medial Branch Nerve RFA: The target area for the L3 medial branch is at the junction of the postero-lateral aspect of the superior articular process and the superior, posterior, and medial edge of the transverse process of L4. Under fluoroscopic guidance, a Radiofrequency needle was inserted until contact was made with os over the superior postero-lateral aspect of the pedicular shadow (target area). Sensory and motor testing was conducted to properly adjust the position of the needle. Once satisfactory placement of the needle was achieved, the numbing solution was slowly injected after negative aspiration for blood. 2.0 mL of the nerve block solution was injected without difficulty or complication. After waiting for at least 3 minutes, the ablation was performed. Once completed, the needle was removed intact. L4 Medial Branch Nerve RFA: The target area for the L4 medial branch is at the junction of the postero-lateral  aspect of the superior articular process and the superior, posterior, and medial edge of the transverse process of L5. Under fluoroscopic guidance, a Radiofrequency needle was inserted until contact was made with os over the superior postero-lateral aspect of the pedicular shadow (target area). Sensory and motor testing was conducted to properly adjust the position of the needle. Once satisfactory placement of the needle was achieved, the numbing solution was slowly injected after negative aspiration for blood. 2.0 mL of the nerve block solution was injected without difficulty or complication. After waiting for at least 3 minutes, the ablation was performed. Once completed, the needle was removed intact.  Radiofrequency lesioning (ablation):  Radiofrequency Generator: Medtronic AccurianTM AG 1000 RF Generator Sensory Stimulation Parameters: 50 Hz was used to locate & identify the nerve, making sure that the needle was positioned such that there was no sensory stimulation below 0.3 V or above 0.7 V. Motor Stimulation Parameters: 2 Hz was used to evaluate the motor component. Care was taken not to lesion any nerves that demonstrated motor stimulation of the lower extremities at an output of less than 2.5 times that of the sensory threshold, or a maximum of 2.0 V. Lesioning Technique Parameters: Standard Radiofrequency settings. (Not bipolar or pulsed.) Temperature Settings: 80 degrees C Lesioning time: 60 seconds Intra-operative Compliance: Compliant  Once the entire procedure was completed, the treated area was cleaned, making sure to leave some of the prepping solution back to take advantage of its long term bactericidal properties.    Illustration of the posterior view of the lumbar spine and the posterior neural structures. Laminae of L2 through S1 are labeled. DPRL5, dorsal primary ramus of L5; DPRS1, dorsal primary ramus of S1; DPR3, dorsal primary ramus of L3; FJ, facet (zygapophyseal) joint L3-L4;  I, inferior articular process of L4; LB1, lateral branch of dorsal primary ramus of L1; IAB, inferior articular branches from L3 medial branch (supplies L4-L5 facet joint); IBP, intermediate branch plexus; MB3, medial branch of dorsal primary ramus of L3; NR3, third lumbar nerve root;  S, superior articular process of L5; SAB, superior articular branches from L4 (supplies L4-5 facet joint also); TP3, transverse process of L3.  Facet Joint Innervation (* possible contribution)  L1-2 T12, L1 (L2*)  Medial Branch  L2-3 L1, L2 (L3*)         "          "  L3-4 L2, L3 (L4*)         "          "  L4-5 L3, L4 (L5*)         "          "  L5-S1 L4, L5, S1          "          "    Vitals:   08/04/22 0917 08/04/22 0922 08/04/22 0927 08/04/22 0930  BP: 110/72 118/79 115/81 118/79  Pulse:      Resp: 17 15 17 15   Temp:      TempSrc:      SpO2: 99% 99% 99% 100%  Weight:      Height:        Start Time: 0901 hrs. End Time: 0929 hrs.  Imaging Guidance (Spinal):          Type of Imaging Technique: Fluoroscopy Guidance (Spinal) Indication(s): Assistance in needle guidance and placement for procedures requiring needle placement in or near specific anatomical locations not easily accessible without such assistance. Exposure Time: Please see nurses notes. Contrast: None used. Fluoroscopic Guidance: I was personally present during the use of fluoroscopy. "Tunnel Vision Technique" used to obtain the best possible view of the target area. Parallax error corrected before commencing the procedure. "Direction-depth-direction" technique used to introduce the needle under continuous pulsed fluoroscopy. Once target was reached, antero-posterior, oblique, and lateral fluoroscopic projection used confirm needle placement in all planes. Images permanently stored in EMR. Interpretation: No contrast injected. I personally interpreted the imaging intraoperatively. Adequate needle placement confirmed in multiple planes.  Permanent images saved into the patient's record.  Antibiotic Prophylaxis:   Anti-infectives (From admission, onward)    None      Indication(s): None identified  Post-operative Assessment:  Post-procedure Vital Signs:  Pulse/HCG Rate: (!) 5860 (NSR) Temp: (!) 97.3 F (36.3 C) Resp: 15 BP: 118/79 SpO2: 100 %  EBL: None  Complications: No immediate post-treatment complications observed by team, or reported by patient.  Note: The patient tolerated the entire procedure well. A repeat set of vitals were taken after the procedure and the patient was kept under observation following institutional policy, for this type of procedure. Post-procedural neurological assessment was performed, showing return to baseline, prior to discharge. The patient was provided with post-procedure discharge instructions, including a section on how to identify potential problems. Should any problems arise concerning this procedure, the patient was given instructions to immediately contact us, at any time, without hesitation. In any case, we plan to contact the patient by telephone for a follow-up status report regarding this interventional procedure.  Comments:  No additional relevant information.  Plan of Care (POC)  Orders:  Orders Placed This Encounter  Procedures   Radiofrequency,Lumbar    Scheduling Instructions:     Side(s): Left-sided     Level: L2-3, L3-4, and L4-5 Facets (L1, L2, L3, and L4 Medial Branch)     Sedation: With Sedation.     Timeframe: Today    Order Specific Question:   Where will this procedure be performed?    Answer:  [facility]   DG PAIN [facility] C-ARM 1-60 MIN NO REPORT    Intraoperative interpretation by procedural physician at [facility].    Standing Status:   Standing    Number of Occurrences:   1    Order Specific Question:   Reason for exam:    Answer:   Assistance in needle guidance and placement for procedures requiring needle placement in or  near specific anatomical locations not easily accessible without such assistance.   Nursing Instructions:    Please complete this patient's postprocedure evaluation.    Scheduling Instructions:     Please complete this patient's postprocedure evaluation.   Informed Consent Details: Physician/Practitioner Attestation; Transcribe to consent form and obtain patient signature    Nursing Order: Transcribe to consent form and obtain patient signature. Note: Always confirm laterality of pain with Joseph Arroyo, before procedure.    Order Specific Question:   Physician/Practitioner attestation of informed consent for procedure/surgical case    Answer:   I, the physician/practitioner, attest that I have discussed with the patient the benefits, risks, side effects, alternatives, likelihood of achieving goals and potential problems during recovery for the procedure that I have provided informed consent.    Order Specific Question:   Procedure    Answer:   Lumbar Facet Radiofrequency Ablation    Order Specific Question:   Physician/Practitioner performing the procedure    Answer:   Marwin Primmer A. Laban Emperor, MD    Order Specific Question:   Indication/Reason    Answer:   Low Back Pain, with our without leg pain, due to Facet Joint Arthralgia (Joint Pain) known as Lumbar Facet Syndrome, secondary to Lumbar, and/or Lumbosacral Spondylosis (Arthritis of the Spine), without myelopathy or radiculopathy (Nerve Damage).   Follow-up    Schedule Joseph Arroyo for a post-procedure follow-up evaluation encounter 6 weeks from now.    Standing Status:   Future    Standing Expiration Date:   09/15/2022    Scheduling Instructions:     Schedule follow-up visit on afternoon of procedure day (T, Th)     Type: Face-to-face (F2F) Post-procedure (PP) evaluation (E/M)     When: 6 weeks from now   Provide equipment / supplies at bedside    Procedure tray: "Radiofrequency Tray" Additional material: Large hemostat (x1); Small  hemostat (x1); Towels (x8); 4x4 sterile sponge pack (x1) Needle type: Teflon-coated Radiofrequency Needle (Disposable  single use) Size: Regular Quantity: 4    Standing Status:   Standing    Number of Occurrences:   1    Order Specific Question:   Specify    Answer:   Radiofrequency Tray   Chronic Opioid Analgesic:  No chronic opioid analgesics therapy prescribed by our practice. None MME/day: 0 mg/day   Medications ordered for procedure: Meds ordered this encounter  Medications   lidocaine (XYLOCAINE) 2 % (with pres) injection 400 mg   pentafluoroprop-tetrafluoroeth (GEBAUERS) aerosol   lactated ringers infusion   midazolam (VERSED) 5 MG/5ML injection 0.5-2 mg    Make sure Flumazenil is available in the pyxis when using this medication. If oversedation occurs, administer 0.2 mg IV over 15 sec. If after 45 sec no response, administer 0.2 mg again over 1 min; may repeat at 1 min intervals; not to exceed 4 doses (1 mg)   fentaNYL (SUBLIMAZE) injection 25-50 mcg    Make sure Narcan is available in the pyxis when using this medication. In the event of respiratory depression (RR< 8/min): Titrate NARCAN (naloxone) in increments of  0.1 to 0.2 mg IV at 2-3 minute intervals, until desired degree of reversal.   triamcinolone acetonide (KENALOG-40) injection 40 mg   ropivacaine (PF) 2 mg/mL (0.2%) (NAROPIN) injection 9 mL   HYDROcodone-acetaminophen (NORCO/VICODIN) 5-325 MG tablet    Sig: Take 1 tablet by mouth every 6 (six) hours as needed for up to 7 days for severe pain. Must last 7 days.    Dispense:  28 tablet    Refill:  0    For acute post-operative pain. Not to be refilled. Must last 7 days.   HYDROcodone-acetaminophen (NORCO/VICODIN) 5-325 MG tablet    Sig: Take 1 tablet by mouth every 6 (six) hours as needed for up to 7 days for severe pain. Must last 7 days.    Dispense:  28 tablet    Refill:  0    For acute post-operative pain. Not to be refilled.  Must last 7 days.    Medications administered: We administered lidocaine, pentafluoroprop-tetrafluoroeth, lactated ringers, midazolam, triamcinolone acetonide, and ropivacaine (PF) 2 mg/mL (0.2%).  See the medical record for exact dosing, route, and time of administration.  Follow-up plan:   Return in about 6 weeks (around 09/15/2022) for Proc-day (T,Th), (Face2F), (PPE).       Interventional Therapies  Risk Factors  Considerations:   Drug hypersensitivity: Oral prednisone (hyperactivity) Movement disorder (Parkinson's dyskinesia)  unsteady gait  tremor  Hx. Seizures   Carotid stenosis  orthostatic hypotension  anxiety/depression  GERD      Levoscoliosis (L3-4 apex)     Planned  Pending:   Therapeutic right (L2-3, L3-4, L4-5) lumbar facet RFA #1     Under consideration:   Therapeutic right L1-2 LESI #3  Diagnostic bilateral L2-3 & L3-4 lumbar facet (L1-4) MBB #2  Diagnostic x-rays of the cervical spine for further evaluation of his cervicalgia.   Completed:   Diagnostic/therapeutic bilateral L2 + L3 TFESI x1 (05/07/2022) (50/50/25/20)  Diagnostic bilateral L2-3 & L3-4 Facet Blk (L1-4 MBB) x1 (12/23/2021) (75/75/75 x2 weeks)  Diagnostic bilateral lumbar facet (L2-S1) MBB x1 (07/08/2021) (50/50/50/<50)  Therapeutic right L1-2 LESI x2 (01/27/2022) (1st:100/100/50/50) (2nd: 75/75/50/50)  Diagnostic right L2-3 LESI x1 (06/03/2021) (100/100/45/45)  Referral to physical therapy for LB PT as well as a back brace eval. (04/28/2021) (water aerobics)    Therapeutic  Palliative (PRN) options:   None w/o F2F evaluation by provider.   Pharmacotherapy  Drug hypersensitivity: Oral prednisone (hyperactivity)    Pharmacologic Recommendations  For a more complete explanation please see the dictation in the body of my 05/21/2022 note. Oxycodone 5 mg PO TID to QID PRN for pain.          Recent Visits Date Type Provider Dept  06/25/22 Procedure visit Delano Metz, MD [facility]   05/21/22 Office Visit Delano Metz, MD [facility]  05/07/22 Procedure visit Delano Metz, MD [facility]  Showing recent visits within past 90 days and meeting all other requirements Today's Visits Date Type Provider Dept  08/04/22 Procedure visit Delano Metz, MD [facility]  Showing today's visits and meeting all other requirements Future Appointments Date Type Provider Dept  09/15/22 Appointment Delano Metz, MD [facility]  Showing future appointments within next 90 days and meeting all other requirements  Disposition: Discharge home  Discharge (Date  Time): 08/04/2022;   hrs.   Primary Care Physician: Smitty Cords, DO Location: [facility] Note by: Oswaldo Done, MD (TTS technology used. I apologize for any typographical  errors that were not detected and corrected.) Date: 08/04/2022; Time: 9:40 AM  Disclaimer:  Medicine is not an Visual merchandiser. The only guarantee in medicine is that nothing is guaranteed. It is important to note that the decision to proceed with this intervention was based on the information collected from the patient. The Data and conclusions were drawn from the patient's questionnaire, the interview, and the physical examination. Because the information was provided in large part by the patient, it cannot be guaranteed that it has not been purposely or unconsciously manipulated. Every effort has been made to obtain as much relevant data as possible for this evaluation. It is important to note that the conclusions that lead to this procedure are derived in large part from the available data. Always take into account that the treatment will also be dependent on availability of resources and existing treatment guidelines, considered by other Pain Management Practitioners as being common knowledge and practice, at the time of the intervention. For Medico-Legal  purposes, it is also important to point out that variation in procedural techniques and pharmacological choices are the acceptable norm. The indications, contraindications, technique, and results of the above procedure should only be interpreted and judged by a Board-Certified Interventional Pain Specialist with extensive familiarity and expertise in the same exact procedure and technique.

## 2022-08-03 NOTE — Telephone Encounter (Signed)
Voicemail left.

## 2022-08-04 ENCOUNTER — Encounter: Payer: Self-pay | Admitting: Pain Medicine

## 2022-08-04 ENCOUNTER — Ambulatory Visit
Admission: RE | Admit: 2022-08-04 | Discharge: 2022-08-04 | Disposition: A | Discharge status: Home / Self Care | Payer: Medicare HMO | Source: Ambulatory Visit | Attending: Pain Medicine | Admitting: Pain Medicine

## 2022-08-04 ENCOUNTER — Ambulatory Visit: Payer: Medicare HMO | Attending: Pain Medicine | Admitting: Pain Medicine

## 2022-08-04 VITALS — BP 105/67 | HR 60 | Temp 97.3°F | Resp 16 | Ht 66.0 in | Wt 130.0 lb

## 2022-08-04 DIAGNOSIS — G8929 Other chronic pain: Secondary | ICD-10-CM | POA: Diagnosis not present

## 2022-08-04 DIAGNOSIS — M47817 Spondylosis without myelopathy or radiculopathy, lumbosacral region: Secondary | ICD-10-CM | POA: Diagnosis not present

## 2022-08-04 DIAGNOSIS — M5137 Other intervertebral disc degeneration, lumbosacral region: Secondary | ICD-10-CM

## 2022-08-04 DIAGNOSIS — Z5189 Encounter for other specified aftercare: Secondary | ICD-10-CM

## 2022-08-04 DIAGNOSIS — G20B1 Parkinson's disease with dyskinesia, without mention of fluctuations: Secondary | ICD-10-CM

## 2022-08-04 DIAGNOSIS — M545 Low back pain, unspecified: Secondary | ICD-10-CM | POA: Diagnosis not present

## 2022-08-04 DIAGNOSIS — M5459 Other low back pain: Secondary | ICD-10-CM | POA: Diagnosis not present

## 2022-08-04 DIAGNOSIS — Z09 Encounter for follow-up examination after completed treatment for conditions other than malignant neoplasm: Secondary | ICD-10-CM

## 2022-08-04 DIAGNOSIS — M4310 Spondylolisthesis, site unspecified: Secondary | ICD-10-CM | POA: Diagnosis not present

## 2022-08-04 DIAGNOSIS — G8918 Other acute postprocedural pain: Secondary | ICD-10-CM | POA: Diagnosis not present

## 2022-08-04 DIAGNOSIS — M47816 Spondylosis without myelopathy or radiculopathy, lumbar region: Secondary | ICD-10-CM | POA: Diagnosis not present

## 2022-08-04 MED ORDER — FENTANYL CITRATE (PF) 100 MCG/2ML IJ SOLN: 25.0000 ug | INTRAMUSCULAR | Status: DC | PRN

## 2022-08-04 MED ORDER — TRIAMCINOLONE ACETONIDE 40 MG/ML IJ SUSP
40.0000 mg | Freq: Once | INTRAMUSCULAR | Status: AC
  Administered 2022-08-04: 40 mg

## 2022-08-04 MED ORDER — FENTANYL CITRATE (PF) 100 MCG/2ML IJ SOLN
INTRAMUSCULAR | Status: AC
  Filled 2022-08-04: qty 2

## 2022-08-04 MED ORDER — TRIAMCINOLONE ACETONIDE 40 MG/ML IJ SUSP
INTRAMUSCULAR | Status: AC
  Filled 2022-08-04: qty 1

## 2022-08-04 MED ORDER — LIDOCAINE HCL 2 % IJ SOLN
INTRAMUSCULAR | Status: AC
  Filled 2022-08-04: qty 20

## 2022-08-04 MED ORDER — PENTAFLUOROPROP-TETRAFLUOROETH EX AERO
INHALATION_SPRAY | Freq: Once | CUTANEOUS | Status: AC
  Administered 2022-08-04: 30 via TOPICAL
  Filled 2022-08-04: qty 116

## 2022-08-04 MED ORDER — HYDROCODONE-ACETAMINOPHEN 5-325 MG PO TABS
1.0000 | ORAL_TABLET | Freq: Four times a day (QID) | ORAL | 0 refills | Status: AC | PRN
Start: 2022-08-04 — End: 2022-08-11

## 2022-08-04 MED ORDER — LACTATED RINGERS IV SOLN: Freq: Once | INTRAVENOUS | Status: AC

## 2022-08-04 MED ORDER — MIDAZOLAM HCL 5 MG/5ML IJ SOLN
0.5000 mg | Freq: Once | INTRAMUSCULAR | Status: AC
  Administered 2022-08-04: 1 mg via INTRAVENOUS

## 2022-08-04 MED ORDER — LIDOCAINE HCL 2 % IJ SOLN
20.0000 mL | Freq: Once | INTRAMUSCULAR | Status: AC
  Administered 2022-08-04: 400 mg

## 2022-08-04 MED ORDER — ROPIVACAINE HCL 2 MG/ML IJ SOLN
INTRAMUSCULAR | Status: AC
  Filled 2022-08-04: qty 20

## 2022-08-04 MED ORDER — MIDAZOLAM HCL 5 MG/5ML IJ SOLN
INTRAMUSCULAR | Status: AC
  Filled 2022-08-04: qty 5

## 2022-08-04 MED ORDER — HYDROCODONE-ACETAMINOPHEN 5-325 MG PO TABS
1.0000 | ORAL_TABLET | Freq: Four times a day (QID) | ORAL | 0 refills | Status: AC | PRN
Start: 2022-08-11 — End: 2022-08-18

## 2022-08-04 MED ORDER — ROPIVACAINE HCL 2 MG/ML IJ SOLN
9.0000 mL | Freq: Once | INTRAMUSCULAR | Status: AC
  Administered 2022-08-04: 9 mL via PERINEURAL

## 2022-08-04 NOTE — Patient Instructions (Signed)

## 2022-08-05 ENCOUNTER — Telehealth: Payer: Self-pay | Admitting: *Deleted

## 2022-08-05 NOTE — Telephone Encounter (Signed)
No problems post procedure. 

## 2022-08-12 ENCOUNTER — Telehealth: Payer: Self-pay

## 2022-08-12 ENCOUNTER — Telehealth: Payer: Self-pay | Admitting: Pain Medicine

## 2022-08-12 ENCOUNTER — Other Ambulatory Visit: Payer: Self-pay | Admitting: Psychiatry

## 2022-08-12 DIAGNOSIS — F411 Generalized anxiety disorder: Secondary | ICD-10-CM

## 2022-08-12 NOTE — Telephone Encounter (Signed)
PT stated that pharmacy need a PA to be able to fill hydrocodone. PT told last pill that he had today. Can you give patient a call, once PA has been send in.TY

## 2022-08-12 NOTE — Telephone Encounter (Signed)
Please call [phone] [name], patients wife. They would not give them the pain medicine yesterday and she wants someone to call her back to discuss.

## 2022-08-12 NOTE — Telephone Encounter (Signed)
PA needed to be done. Lawson Fiscal has taken care of this.

## 2022-08-12 NOTE — Telephone Encounter (Signed)
PA completed and approved thru 02/11/23 for Hydro- apap 5-325 mg.

## 2022-08-20 ENCOUNTER — Telehealth: Payer: Self-pay

## 2022-08-20 NOTE — Telephone Encounter (Signed)
Pt called for lab results from March. Shared provider's note.  Joseph Cords, DO 05/27/2022  6:29 PM EDT Back to Top    Please contact patient to review the following (No MyChart Access):   1. Chemistry - Normal liver and kidney function. He has some mild lower Sodium, that is similar to previous results in the past 1 year. No medication changes needed. He should stay well hydrated and have some electrolytes as well. Too much regular water will lower sodium.   Saralyn Pilar, DO [facility] 05/27/2022, 6:29 PM    Pt would like a call back letting him know when he is due for an OV. Please advise.

## 2022-08-21 NOTE — Telephone Encounter (Signed)
Left message advising pt he was due for an office visit in May.  PEC please schedule when he calls back.   Thanks,   -Vernona Rieger

## 2022-09-01 ENCOUNTER — Telehealth: Payer: Medicare HMO

## 2022-09-09 DIAGNOSIS — B353 Tinea pedis: Secondary | ICD-10-CM | POA: Diagnosis not present

## 2022-09-09 DIAGNOSIS — B351 Tinea unguium: Secondary | ICD-10-CM | POA: Diagnosis not present

## 2022-09-09 DIAGNOSIS — L853 Xerosis cutis: Secondary | ICD-10-CM | POA: Diagnosis not present

## 2022-09-15 ENCOUNTER — Ambulatory Visit: Payer: Medicare HMO | Attending: Pain Medicine | Admitting: Pain Medicine

## 2022-09-15 ENCOUNTER — Encounter: Payer: Self-pay | Admitting: Pain Medicine

## 2022-09-15 VITALS — BP 97/69 | HR 71 | Temp 97.5°F | Ht 68.0 in | Wt 135.0 lb

## 2022-09-15 DIAGNOSIS — G8929 Other chronic pain: Secondary | ICD-10-CM | POA: Diagnosis not present

## 2022-09-15 DIAGNOSIS — M5459 Other low back pain: Secondary | ICD-10-CM | POA: Diagnosis not present

## 2022-09-15 DIAGNOSIS — M47816 Spondylosis without myelopathy or radiculopathy, lumbar region: Secondary | ICD-10-CM | POA: Insufficient documentation

## 2022-09-15 DIAGNOSIS — M47817 Spondylosis without myelopathy or radiculopathy, lumbosacral region: Secondary | ICD-10-CM | POA: Insufficient documentation

## 2022-09-15 DIAGNOSIS — Z09 Encounter for follow-up examination after completed treatment for conditions other than malignant neoplasm: Secondary | ICD-10-CM | POA: Insufficient documentation

## 2022-09-15 DIAGNOSIS — G20B1 Parkinson's disease with dyskinesia, without mention of fluctuations: Secondary | ICD-10-CM | POA: Insufficient documentation

## 2022-09-15 DIAGNOSIS — M545 Low back pain, unspecified: Secondary | ICD-10-CM | POA: Insufficient documentation

## 2022-09-15 NOTE — Progress Notes (Signed)
PROVIDER NOTE: Information contained herein reflects review and annotations entered in association with encounter. Interpretation of such information and data should be left to medically-trained personnel. Information provided to patient can be located elsewhere in the medical record under "Patient Instructions". Document created using STT-dictation technology, any transcriptional errors that may result from process are unintentional.    Patient: Joseph Arroyo  Service Category: E/M  Provider: Oswaldo Done, MD  DOB: 1941-02-03  DOS: 09/15/2022  Referring Provider: Saralyn Pilar *  MRN: [id number]  Specialty: Interventional Pain Management  PCP: Smitty Cords, DO  Type: Established Patient  Setting: Ambulatory outpatient    Location: Office  Delivery: Face-to-face     HPI  Joseph Arroyo, a 82 y.o. year old male, is here today because of his Lumbar facet joint pain [M54.59]. Joseph Arroyo's primary complain today is Back Pain (lower)  Pertinent problems: Joseph Arroyo has Chronic knee pain (Left); Derangement of medial meniscus, posterior horn (Left); PAD (peripheral artery disease) (HCC); Chronic pain syndrome; Grade 1 Retrolisthesis of L2/L3 (5 mm) and L3/L4 (3 mm); Levoscoliosis of lumbar spine (L3-4 apex); DDD (degenerative disc disease), lumbosacral; Lumbosacral facet arthropathy (Left: L3-4, L4-5, and L5-S1); Tricompartment osteoarthritis of knee (Left); Baker cyst (Left); Chronic low back pain (1ry area of Pain) (Bilateral) (R>L) w/o sciatica; Lumbar facet syndrome (Bilateral); Chronic lower extremity pain (2ry area of Pain) (Bilateral) (L>R); Lumbosacral radiculitis/sensory radiculopathy at L2 (Bilateral); Lumbosacral radiculitis/sensory radiculopathy at L3 (Bilateral); Abnormal MRI, lumbar spine (04/23/2021); Lumbar central spinal stenosis, w/o neurogenic claudication (L4-5); Lumbar lateral recess stenosis (Bilateral: L2-3, L4-5) (Right: L3-4); Lumbosacral  foraminal stenosis (Bilateral: L2-3, L3-4) (Left: L5-S1); Lumbar nerve root impingement (Right: L3 at L2-3 & L3-4); Ligamentum flavum hypertrophy (L3-4, L4-5); Spondylosis without myelopathy or radiculopathy, lumbosacral region; Cervicalgia; Parkinson's disease with dyskinesia; Lumbar facet joint pain; Osteoarthritis of facet joint of lumbar spine; and Lumbosacral facet joint hypertrophy on their pertinent problem list. Pain Assessment: Severity of Chronic pain is reported as a 3 /10. Location: Back Lower/denies. Onset: More than a month ago. Quality: Aching, Constant. Timing: Constant. Modifying factor(s): meds, rest, heat. Vitals:  height is 5\' 8"  (1.727 m) and weight is 135 lb (61.2 kg). His temporal temperature is 97.5 F (36.4 C) (abnormal). His blood pressure is 97/69 and his pulse is 71. His oxygen saturation is 98%.  BMI: Estimated body mass index is 20.53 kg/m as calculated from the following:   Height as of this encounter: 5\' 8"  (1.727 m).   Weight as of this encounter: 135 lb (61.2 kg). Last encounter: 05/21/2022. Last procedure: 08/04/2022.  Reason for encounter: post-procedure evaluation and assessment.  This patient has chronic low back pain secondary to severe scoliosis affecting the lumbar facets.  In treating this pain we have been limited by the insurance company which does not allow [place] to address several levels at a time.  Since his worst pain was initially in the upper portion of his back I set out to do radiofrequency ablation of the L2-3 and L3-4 facet joints, bilaterally.  These facet joints were responsible for the pain in the upper portion of his lower back coming from the grade 1 retrolisthesis of L2 over L3 and the retrolisthesis of L3 over L4.  However, this did not address the fact that the patient has lumbar facet arthropathy independent of the spondylolisthesis, that is also involving the L4-5 and L5-S1 facet joints as seen by imaging describing that lumbosacral facet  arthropathy and hypertrophy.  After having had  radiofrequency ablation of the L2-3 and L3-4 this appears to have brought down his total low back pain by 30% with the remaining 70% likely to be coming from the lower lumbar facet joints.  Currently he is denying any lower extremity pain, numbness or weakness.  He describes his current pain has been a band that goes from one side to the other in the form of a rectangle, right over the area of the PSIS, bilaterally.  This is likely to be responsible for his remaining pain.  At this point, the plan is to bring him back in to do a bilateral L4-5 and L5-S1 lumbar facet block to see if this would address most of the 70% that remains.  The plan was shared with the patient who understood and accepted.  Post-procedure evaluation   Procedure: Lumbar Facet, Medial Branch Radiofrequency Ablation (RFA) #1  Laterality: Left (-LT)  Level: L1, L2, L3, and L4 Medial Branch Level(s). These levels will denervate the L2-3, and L3-4 lumbar facet joints.  Imaging: Fluoroscopy-guided         Anesthesia: Local anesthesia (1-2% Lidocaine) Anxiolysis: IV Versed 1.0 mg Sedation: Moderate Sedation None required. No Fentanyl administered.         DOS: 08/04/2022  Performed by: Oswaldo Done, MD  Purpose: Therapeutic/Palliative Indications: Low back pain severe enough to impact quality of life or function. Indications: 1. Lumbar facet syndrome (Bilateral)   2. Lumbar facet joint pain   3. Spondylosis without myelopathy or radiculopathy, lumbosacral region   4. Grade 1 Retrolisthesis of L2/L3 (5 mm) and L3/L4 (3 mm)   5. Lumbosacral facet arthropathy (Left: L3-4, L4-5, and L5-S1)   6. Lumbosacral facet joint hypertrophy   7. Chronic low back pain (1ry area of Pain) (Bilateral) (R>L) w/o sciatica   8. Osteoarthritis of facet joint of lumbar spine   9. DDD (degenerative disc disease), lumbosacral   10. Parkinson's disease with dyskinesia, unspecified whether  manifestations fluctuate    Joseph Arroyo has been dealing with the above chronic pain for longer than three months and has either failed to respond, was unable to tolerate, or simply did not get enough benefit from other more conservative therapies including, but not limited to: 1. Over-the-counter medications 2. Anti-inflammatory medications 3. Muscle relaxants 4. Membrane stabilizers 5. Opioids 6. Physical therapy and/or chiropractic manipulation 7. Modalities (Heat, ice, etc.) 8. Invasive techniques such as nerve blocks. Joseph Arroyo has attained more than 50% relief of the pain from a series of diagnostic injections conducted in separate occasions.  Pain Score: Pre-procedure: 5 /10 Post-procedure: 0-No pain/10     Effectiveness:  Initial hour after procedure: 50 %. Subsequent 4-6 hours post-procedure: 50 %. Analgesia past initial 6 hours: 30 %. Ongoing improvement:  Analgesic: The patient indicates having attained 50% relief of his low back pain for the duration of the local anesthetic followed by a decrease to an ongoing 30% improvement.  Unfortunately, this does not go along with the initial pain score given on the day of the procedure where he indicated having attained 100% relief of the pain for the duration of the local anesthetic. Function: Somewhat improved ROM: Somewhat improved  Pharmacotherapy Assessment  Analgesic: No chronic opioid analgesics therapy prescribed by our practice. None MME/day: 0 mg/day   Monitoring: [place] PMP: PDMP reviewed during this encounter.       Pharmacotherapy: No side-effects or adverse reactions reported. Compliance: No problems identified. Effectiveness: Clinically acceptable.  No notes on file  No results found for: "  CBDTHCR" No results found for: "D8THCCBX" No results found for: "D9THCCBX"  UDS:  Summary  Date Value Ref Range Status  03/25/2021 Note  Final    Comment:     ==================================================================== Compliance Drug Analysis, Ur ==================================================================== Test                             Result       Flag       Units  Drug Present and Declared for Prescription Verification   Gabapentin                     PRESENT      EXPECTED   Baclofen                       PRESENT      EXPECTED   Paroxetine                     PRESENT      EXPECTED   Ibuprofen                      PRESENT      EXPECTED  Drug Present not Declared for Prescription Verification   Acetaminophen                  PRESENT      UNEXPECTED  Drug Absent but Declared for Prescription Verification   Alprazolam                     Not Detected UNEXPECTED ng/mg creat ==================================================================== Test                      Result    Flag   Units      Ref Range   Creatinine              122              mg/dL      >=24 ==================================================================== Declared Medications:  The flagging and interpretation on this report are based on the  following declared medications.  Unexpected results may arise from  inaccuracies in the declared medications.   **Note: The testing scope of this panel includes these medications:   Alprazolam  Baclofen (Lioresal)  Gabapentin (Neurontin)  Paroxetine (Paxil)   **Note: The testing scope of this panel does not include small to  moderate amounts of these reported medications:   Ibuprofen (Advil)   **Note: The testing scope of this panel does not include the  following reported medications:   Betamethasone (Lotrisone)  Carbidopa (Sinemet)  Clotrimazole (Lotrisone)  Folic Acid  Levodopa (Sinemet)  Lutein  Magnesium  Omega-3 Fatty Acids  Pramipexole (Mirapex)  Supplement  Ubiquinone (CoQ10)  Vitamin B ==================================================================== For clinical consultation,  please call [phone]. ====================================================================       ROS  Constitutional: Denies any fever or chills Gastrointestinal: No reported hemesis, hematochezia, vomiting, or acute GI distress Musculoskeletal: Denies any acute onset joint swelling, redness, loss of ROM, or weakness Neurological: No reported episodes of acute onset apraxia, aphasia, dysarthria, agnosia, amnesia, paralysis, loss of coordination, or loss of consciousness  Medication Review  ALPRAZolam, B Complex Vitamins, Capsicum-Garlic, [place], L-Methylfolate, Lutein, PARoxetine, Saw Palmetto (Serenoa repens), acetaminophen, baclofen, carbidopa-levodopa, and gabapentin  History Review  Allergy: Joseph Arroyo is allergic to prednisone and wheat. Drug: Joseph Arroyo  reports  no history of drug use. Alcohol:  reports no history of alcohol use. Tobacco:  reports that he has never smoked. He has never used smokeless tobacco. Social: Joseph Arroyo  reports that he has never smoked. He has never used smokeless tobacco. He reports that he does not drink alcohol and does not use drugs. Medical:  has a past medical history of Allergic rhinitis due to pollen (11/21/2007), Brachial neuritis or radiculitis NOS, Cervicalgia, Concussion, Costal chondritis, Depression, Essential and other specified forms of tremor, GERD (gastroesophageal reflux disease), Lumbago, Occlusion and stenosis of carotid artery without mention of cerebral infarction, and Seizures (HCC). Surgical: Joseph Arroyo  has a past surgical history that includes Other surgical history (2002); Colonoscopy (2008); Stapedes surgery (Right, 2002); Cataract extraction (Right, 07/18/14); and Cataract extraction w/PHACO (Left, 08/01/2014). Family: family history includes Asthma in his father; Heart failure in his mother.  Laboratory Chemistry Profile   Renal Lab Results  Component Value Date   BUN 22 05/26/2022   CREATININE 0.67 (L)  05/26/2022   BCR 33 (H) 05/26/2022   GFRAA 100 09/07/2019   GFRNONAA >60 03/24/2021    Hepatic Lab Results  Component Value Date   AST 19 05/26/2022   ALT 11 05/26/2022   ALBUMIN 4.1 03/24/2021   ALKPHOS 51 03/24/2021    Electrolytes Lab Results  Component Value Date   NA 130 (L) 05/26/2022   K 4.4 05/26/2022   CL 92 (L) 05/26/2022   CALCIUM 9.4 05/26/2022   MG 2.0 03/24/2021    Bone Lab Results  Component Value Date   VD25OH 42 10/10/2021   25OHVITD1 32 03/24/2021   25OHVITD2 <1.0 03/24/2021   25OHVITD3 31 03/24/2021    Inflammation (CRP: Acute Phase) (ESR: Chronic Phase) Lab Results  Component Value Date   CRP 0.5 03/24/2021   ESRSEDRATE 4 03/24/2021         Note: Above Lab results reviewed.  Recent Imaging Review  DG PAIN CLINIC C-ARM 1-60 MIN NO REPORT Fluoro was used, but no Radiologist interpretation will be provided.  Please refer to "NOTES" tab for provider progress note. Note: Reviewed        Physical Exam  General appearance: Well nourished, well developed, and well hydrated. In no apparent acute distress Mental status: Alert, oriented x 3 (person, place, & time)       Respiratory: No evidence of acute respiratory distress Eyes: PERLA Vitals: BP 97/69   Pulse 71   Temp (!) 97.5 F (36.4 C) (Temporal)   Ht 5\' 8"  (1.727 m)   Wt 135 lb (61.2 kg)   SpO2 98%   BMI 20.53 kg/m  BMI: Estimated body mass index is 20.53 kg/m as calculated from the following:   Height as of this encounter: 5\' 8"  (1.727 m).   Weight as of this encounter: 135 lb (61.2 kg). Ideal: Ideal body weight: 68.4 kg (150 lb 12.7 oz)  Assessment   Diagnosis Status  1. Lumbar facet joint pain   2. Lumbar facet syndrome (Bilateral)   3. Chronic low back pain (1ry area of Pain) (Bilateral) (R>L) w/o sciatica   4. Parkinson's disease with dyskinesia, unspecified whether manifestations fluctuate   5. Postop check   6. Lumbosacral facet arthropathy (Left: L3-4, L4-5, and L5-S1)    7. Lumbosacral facet joint hypertrophy   8. Osteoarthritis of facet joint of lumbar spine    Controlled Controlled Controlled   Updated Problems: No problems updated.  Plan of Care  Problem-specific:  No problem-specific Assessment & Plan  notes found for this encounter.  Joseph Arroyo has a current medication list which includes the following long-term medication(s): gabapentin, paroxetine, and paroxetine.  Pharmacotherapy (Medications Ordered): No orders of the defined types were placed in this encounter.  Orders:  Orders Placed This Encounter  Procedures   LUMBAR FACET(MEDIAL BRANCH NERVE BLOCK) MBNB    Diagnosis: Lumbar Facet Syndrome (M47.816); Lumbosacral Facet Syndrome (M47.817); Lumbar Facet Joint Pain (M54.59) Medical Necessity Statement: 1.Severe chronic axial low back pain causing functional impairment documented by ongoing pain scale assessments. 2.Pain present for longer than 3 months (Chronic) documented to have failed noninvasive conservative therapies. 3.Absence of untreated radiculopathy. 4.There is no radiological evidence of untreated fractures, tumor, infection, or deformity.  Physical Examination Findings: Positive Kemp Maneuver: (Y)  Positive Lumbar Hyperextension-Rotation provocative test: (Y)    Standing Status:   Future    Standing Expiration Date:   12/16/2022    Scheduling Instructions:     Procedure: Lumbar facet Block     Type: Medial Branch Block     Side: Bilateral     Purpose: Diagnostic Radiologic Mapping     Level(s): L4-5 and L5-S1 Facets (L3, L4, L5, and S1 Medial Branch)     Sedation: With Sedation.     Timeframe: ASAA    Order Specific Question:   Where will this procedure be performed?    Answer:   [facility]   Nursing Instructions:    Please complete this patient's postprocedure evaluation.    Scheduling Instructions:     Please complete this patient's postprocedure evaluation.   Follow-up plan:   Return  for [facility]): (B) L4-5, L5-S1 L-FCT Blk.      Interventional Therapies  Risk Factors  Considerations:   Drug hypersensitivity: Oral prednisone (hyperactivity) Movement disorder (Parkinson's dyskinesia)  unsteady gait  tremor  Hx. Seizures   Carotid stenosis  orthostatic hypotension  anxiety/depression  GERD      Levoscoliosis (L3-4 apex)     Planned  Pending:   Diagnostic bilateral L4-5, L5-S1 lumbar facet (L3-S1) MBB #1    Under consideration:   Therapeutic right L1-2 LESI #3  Diagnostic bilateral L2-3 & L3-4 lumbar facet (L1-4) MBB #2  Diagnostic x-rays of the cervical spine for further evaluation of his cervicalgia.   Completed:   Therapeutic right (L2-3, L3-4) lumbar facet RFA x1 (06/25/2022) (50/50/30/40)  Therapeutic left (L2-3, L3-4) lumbar facet RFA x1 (08/04/2022) (50/50/30/30-40)  Diagnostic/therapeutic bilateral L2 + L3 TFESI x1 (05/07/2022) (50/50/25/20)  Diagnostic bilateral L2-3 & L3-4 Facet Blk (L1-4 MBB) x1 (12/23/2021) (75/75/75 x2 weeks)  Diagnostic bilateral lumbar facet (L2-S1) MBB x1 (07/08/2021) (50/50/50/<50)  Therapeutic right L1-2 LESI x2 (01/27/2022) (1st:100/100/50/50) (2nd: 75/75/50/50)  Diagnostic right L2-3 LESI x1 (06/03/2021) (100/100/45/45)  Referral to physical therapy for LB PT as well as a back brace eval. (04/28/2021) (water aerobics)    Therapeutic  Palliative (PRN) options:   None w/o F2F evaluation by provider.   Pharmacotherapy  Drug hypersensitivity: Oral prednisone (hyperactivity)    Pharmacologic Recommendations  For a more complete explanation please see the dictation in the body of my 05/21/2022 note. Oxycodone 5 mg PO TID to QID PRN for pain.           Recent Visits Date Type Provider Dept  08/04/22 Procedure visit Delano Metz, MD [facility]  06/25/22 Procedure visit Delano Metz, MD [facility]  Showing recent visits within past 90 days and meeting all other requirements Today's  Visits Date Type Provider Dept  09/15/22 Office Visit Delano Metz, MD [facility]  Showing today's visits and meeting all other requirements Future Appointments No visits were found meeting these conditions. Showing future appointments within next 90 days and meeting all other requirements  I discussed the assessment and treatment plan with the patient. The patient was provided an opportunity to ask questions and all were answered. The patient agreed with the plan and demonstrated an understanding of the instructions.  Patient advised to call back or seek an in-person evaluation if the symptoms or condition worsens.  Duration of encounter: 44 minutes.  Total time on encounter, as per AMA guidelines included both the face-to-face and non-face-to-face time personally spent by the physician and/or other qualified health care professional(s) on the day of the encounter (includes time in activities that require the physician or other qualified health care professional and does not include time in activities normally performed by clinical staff). Physician's time may include the following activities when performed: Preparing to see the patient (e.g., pre-charting review of records, searching for previously ordered imaging, lab work, and nerve conduction tests) Review of prior analgesic pharmacotherapies. Reviewing PMP Interpreting ordered tests (e.g., lab work, imaging, nerve conduction tests) Performing post-procedure evaluations, including interpretation of diagnostic procedures Obtaining and/or reviewing separately obtained history Performing a medically appropriate examination and/or evaluation Counseling and educating the patient/family/caregiver Ordering medications, tests, or procedures Referring and communicating with other health care professionals (when not separately reported) Documenting clinical information in the electronic or other health record Independently  interpreting results (not separately reported) and communicating results to the patient/ family/caregiver Care coordination (not separately reported)  Note by: Oswaldo Done, MD Date: 09/15/2022; Time: 2:50 PM

## 2022-09-15 NOTE — Patient Instructions (Addendum)

## 2022-09-25 ENCOUNTER — Ambulatory Visit (INDEPENDENT_AMBULATORY_CARE_PROVIDER_SITE_OTHER): Payer: Medicare HMO

## 2022-09-25 ENCOUNTER — Telehealth: Payer: Medicare HMO

## 2022-09-25 DIAGNOSIS — G894 Chronic pain syndrome: Secondary | ICD-10-CM

## 2022-09-25 DIAGNOSIS — M545 Low back pain, unspecified: Secondary | ICD-10-CM

## 2022-09-25 DIAGNOSIS — G20A2 Parkinson's disease without dyskinesia, with fluctuations: Secondary | ICD-10-CM

## 2022-09-25 DIAGNOSIS — F419 Anxiety disorder, unspecified: Secondary | ICD-10-CM

## 2022-09-25 DIAGNOSIS — G20B2 Parkinson's disease with dyskinesia, with fluctuations: Secondary | ICD-10-CM

## 2022-09-25 DIAGNOSIS — F411 Generalized anxiety disorder: Secondary | ICD-10-CM

## 2022-09-25 DIAGNOSIS — M47817 Spondylosis without myelopathy or radiculopathy, lumbosacral region: Secondary | ICD-10-CM

## 2022-09-25 NOTE — Chronic Care Management (AMB) (Signed)
Chronic Care Management   CCM RN Visit Note  09/25/2022 Name: Joseph Arroyo MRN: [id number] DOB: June 20, 1940  Subjective: Joseph Arroyo is a 82 y.o. year old male who is a primary care patient of Joseph Cords, DO. The patient was referred to the Chronic Care Management team for assistance with care management needs subsequent to provider initiation of CCM services and plan of care.    Today's Visit:   Spoke to the patients wife, Joseph Arroyo  for follow up visit.     SDOH Interventions Today    Flowsheet Row Most Recent Value  SDOH Interventions   Health Literacy Interventions Intervention Not Indicated         Goals Addressed             This Visit's Progress    CCM Expected Outcome:  Monitor, Self-Manage and Reduce Symptoms of: Anxiety       Current Barriers:  Care Coordination needs related to social worker support, social work referral pending  in a patient with anxiety Chronic Disease Management support and education needs related to effective management of anxiety  Planned Interventions: Evaluation of current treatment plan related to anxiety and patient's adherence to plan as established by provider. The patient has good days and bad days. Works with the [facility] specialist on a regular basis. He likes to listen to worship music when he is anxious and sometimes eating helps also. He use to do a lot of writing but cannot do that as well due to his PD. He states that his faith keeps him strong and he got a little emotional on the phone. The patient overall is doing fairly well. Denies any acute changes with anxiety or mental health. The patients wife is very good support. Reflective listening and support given.  Provided education to patient re: call the office for changes in mood, anxiety, depression, or mental health needs  Reviewed medications with patient and discussed compliance. The patient is compliant with medications. Takes as prescribed Social Work  referral for ongoing support and education for anxiety and mental health needs. Ongoing support and education given from the LCSW.  Discussed plans with patient for ongoing care management follow up and provided patient with direct contact information for care management team Advised patient to discuss changes in his mood, anxiety, depression, or mental health  with provider Screening for signs and symptoms of depression related to chronic disease state  Assessed social determinant of health barriers  Symptom Management: Take medications as prescribed   Attend all scheduled provider appointments Call provider office for new concerns or questions  call the Suicide and Crisis Lifeline: 988 call the [place] National Suicide Prevention Lifeline: 762-553-6013 or TTY: 808-077-7978 TTY 773-519-5765) to talk to a trained counselor call 1-800-273-TALK (toll free, 24 hour hotline) if experiencing a Mental Health or Behavioral Health Crisis   Follow Up Plan: Telephone follow up appointment with care management team member scheduled for: 11-20-2022 at 1145 am       CCM Expected Outcome:  Monitor, Self-Manage and Reduce Symptoms of: Arthritis Spine, Back Pain       Current Barriers:  Care Coordination needs related to support and education for effective pain management  in a patient with Chronic pain and arthritis  Chronic Disease Management support and education needs related to effective management of chronic pain, arthritis, and scoliosis   Planned Interventions: Reviewed provider established plan for pain management. Currently the patient is stable with his pain management. He goes to  the pain clinic but also is followed closely by pcp. The patients wife knows to call for acute changes. Had a procedure recently on his back and has adequate pain medications. He has had 30 to 40% relief from back pain with his procedures. The patient will have a set block on 09-29-2022. Denies any acute changes today. The  patient does not want to get weak. Discussed exercises he can do while sitting in a chair or stretches he can do also. Education on pacing activity and being careful. Discussed the cubii device to use to help with leg and muscle strength. His daughter also is working with him on leg strengthening.  Has worked with PT/OT before. Has good support from his wife and family Discussed importance of adherence to all scheduled medical appointments; Counseled on the importance of reporting any/all new or changed pain symptoms or management strategies to pain management provider; Advised patient to report to care team affect of pain on daily activities; Discussed use of relaxation techniques and/or diversional activities to assist with pain reduction (distraction, imagery, relaxation, massage, acupressure, TENS, heat, and cold application; Reviewed with patient prescribed pharmacological and nonpharmacological pain relief strategies; Advised patient to discuss changes in level or intensity of pain, unresolved pain, questions and concerns with provider; Screening for signs and symptoms of depression related to chronic disease state;  Assessed social determinant of health barriers;  Review of fall prevention and safety concerns  Symptom Management: Take medications as prescribed   Attend all scheduled provider appointments Call provider office for new concerns or questions  call the Suicide and Crisis Lifeline: 988 call the [place] National Suicide Prevention Lifeline: (607)058-6910 or TTY: (973)591-9568 TTY (202) 250-7448) to talk to a trained counselor call 1-800-273-TALK (toll free, 24 hour hotline) if experiencing a Mental Health or Behavioral Health Crisis   Follow Up Plan: Telephone follow up appointment with care management team member scheduled for: 11-20-2022 at 1145 am       CCM Expected Outcome:  Monitor, Self-Manage and Reduce Symptoms of: Parkinson's       Current Barriers:  Knowledge Deficits  related to resources in the area for support in meeting the needs of the patient as his Parkinson's advances and needs change  Care Coordination needs related to support from the CCM team, education needs, and ongoing support for effective management of Parkinson's and other chronic conditions in a patient with Parkinson's Disease  Chronic Disease Management support and education needs related to effective management of PD  Planned Interventions: Evaluation of current treatment plan related to PD and patient's adherence to plan as established by provider. The patient has had 2 procedures on his back and will have a set block on 09-29-2022. This has improved his back pain 30 to 40%. The patient is working with his daughter some too on keeping his legs moving and walking.  Advised patient to call the [employer] after talking to the insurance and discussing with her husband if they want the pcp to order the hospital bed. His wife did check on some beds but states that there are so many different ones to choose from. Encouraged her to go to the store and talk to the associates to figure out the bed that would be best for the patient. She went to [employer] supply and talked to the representative there and got some details. She wants to go to [employer]. She wants to get an appointment to see the pcp about getting a hospital bed ordered. Will send a  message to staff asking for assistance with getting the patient an appointment. She wants the bed to have an electric controller, short rails, and possibly a trapeze. Provided education to patient re: the CCM team, resources available, support for the patient, information about myChart: The patients wife wishes for the link to be sent to her email at [email or web address]. This has been sent today. The patients wife also advised the need to provide a DPR for the patient for the future of speaking to the team. The patient gave permission for the [facility] to speak to his wife  Joseph Arroyo this call and any subsequent calls.  Reviewed medications with patient and discussed compliance. The patient currently does not have a lot of medications and his wife states she is handling them well. Review of pharm D support if needed.  Collaborated with pcp and LCSW for current expressed needs  regarding PD and advancement of PD and possible future needs. Has spoke with the LCSW and knows how to reach for new questions or concerns Provided patient with PD, falls and safety, and Air cabin crew related to resources to help with effective management of PD Social Work referral for education, support, and long term planning for the patient needs with the advancement of his PD Discussed plans with patient for ongoing care management follow up and provided patient with direct contact information for care management team Advised patient to discuss changes in PD, questions or concerns with provider Screening for signs and symptoms of depression related to chronic disease state  Assessed social determinant of health barriers Encouraged the patients wife to get a notebook dedicated to the patients health needs and write down questions as she or the patient thinks of questions in regard to DME needs, changes in conditions, medication questions, and other information important to meeting the needs of the patient.  The patient currently receives meals from [employer], has a tub bench and rails in the bathroom, frame for over the commode, and a ramp. The wife is going to call insurance about a hospital bed and let the RNCM know the desires of the patient.  Other resource that may be helpful for the patient is [facility] in [place] at [phone]. Gave the patients wife the number to the [employer] in [place], [place] to check out for a lift chair or hospital bed with all the functions the patient may need. She may also view the webpage on Facebook: [employer]  [facility]  Symptom Management: Take medications as prescribed   Attend all scheduled provider appointments Call provider office for new concerns or questions  call the Suicide and Crisis Lifeline: 988 call the [place] National Suicide Prevention Lifeline: 207 339 7056 or TTY: 905-056-6449 TTY (249)373-9261) to talk to a trained counselor call 1-800-273-TALK (toll free, 24 hour hotline) if experiencing a Mental Health or Behavioral Health Crisis   Follow Up Plan: Telephone follow up appointment with care management team member scheduled for: 11-20-2022 at 1145 am          Plan:Telephone follow up appointment with care management team member scheduled for:  11-20-2022 at 1145 am  Alto Denver RN, MSN, CCM RN Care Manager  Chronic Care Management Direct Number: [phone]

## 2022-09-25 NOTE — Patient Instructions (Signed)
Please call the care guide team at [phone] if you need to cancel or reschedule your appointment.   If you are experiencing a Mental Health or Behavioral Health Crisis or need someone to talk to, please call the Suicide and Crisis Lifeline: 988 call the [place] National Suicide Prevention Lifeline: (873)582-7882 or TTY: 772-227-6087 TTY 639-472-4999) to talk to a trained counselor call 1-800-273-TALK (toll free, 24 hour hotline)   Following is a copy of the CCM Program Consent:  CCM service includes personalized support from designated clinical staff supervised by the physician, including individualized plan of care and coordination with other care providers 24/7 contact phone numbers for assistance for urgent and routine care needs. Service will only be billed when office clinical staff spend 20 minutes or more in a month to coordinate care. Only one practitioner may furnish and bill the service in a calendar month. The patient may stop CCM services at amy time (effective at the end of the month) by phone call to the office staff. The patient will be responsible for cost sharing (co-pay) or up to 20% of the service fee (after annual deductible is met)  Following is a copy of your full provider care plan:   Goals Addressed             This Visit's Progress    CCM Expected Outcome:  Monitor, Self-Manage and Reduce Symptoms of: Anxiety       Current Barriers:  Care Coordination needs related to social worker support, social work referral pending  in a patient with anxiety Chronic Disease Management support and education needs related to effective management of anxiety  Planned Interventions: Evaluation of current treatment plan related to anxiety and patient's adherence to plan as established by provider. The patient has good days and bad days. Works with the [facility] specialist on a regular basis. He likes to listen to worship music when he is anxious and sometimes eating helps also. He use  to do a lot of writing but cannot do that as well due to his PD. He states that his faith keeps him strong and he got a little emotional on the phone. The patient overall is doing fairly well. Denies any acute changes with anxiety or mental health. The patients wife is very good support. Reflective listening and support given.  Provided education to patient re: call the office for changes in mood, anxiety, depression, or mental health needs  Reviewed medications with patient and discussed compliance. The patient is compliant with medications. Takes as prescribed Social Work referral for ongoing support and education for anxiety and mental health needs. Ongoing support and education given from the LCSW.  Discussed plans with patient for ongoing care management follow up and provided patient with direct contact information for care management team Advised patient to discuss changes in his mood, anxiety, depression, or mental health  with provider Screening for signs and symptoms of depression related to chronic disease state  Assessed social determinant of health barriers  Symptom Management: Take medications as prescribed   Attend all scheduled provider appointments Call provider office for new concerns or questions  call the Suicide and Crisis Lifeline: 988 call the [place] National Suicide Prevention Lifeline: (432)745-0454 or TTY: (704)510-8820 TTY 626-001-2643) to talk to a trained counselor call 1-800-273-TALK (toll free, 24 hour hotline) if experiencing a Mental Health or Behavioral Health Crisis   Follow Up Plan: Telephone follow up appointment with care management team member scheduled for: 11-20-2022 at 1145 am  CCM Expected Outcome:  Monitor, Self-Manage and Reduce Symptoms of: Arthritis Spine, Back Pain       Current Barriers:  Care Coordination needs related to support and education for effective pain management  in a patient with Chronic pain and arthritis  Chronic Disease  Management support and education needs related to effective management of chronic pain, arthritis, and scoliosis   Planned Interventions: Reviewed provider established plan for pain management. Currently the patient is stable with his pain management. He goes to the pain clinic but also is followed closely by pcp. The patients wife knows to call for acute changes. Had a procedure recently on his back and has adequate pain medications. He has had 30 to 40% relief from back pain with his procedures. The patient will have a set block on 09-29-2022. Denies any acute changes today. The patient does not want to get weak. Discussed exercises he can do while sitting in a chair or stretches he can do also. Education on pacing activity and being careful. Discussed the cubii device to use to help with leg and muscle strength. His daughter also is working with him on leg strengthening.  Has worked with PT/OT before. Has good support from his wife and family Discussed importance of adherence to all scheduled medical appointments; Counseled on the importance of reporting any/all new or changed pain symptoms or management strategies to pain management provider; Advised patient to report to care team affect of pain on daily activities; Discussed use of relaxation techniques and/or diversional activities to assist with pain reduction (distraction, imagery, relaxation, massage, acupressure, TENS, heat, and cold application; Reviewed with patient prescribed pharmacological and nonpharmacological pain relief strategies; Advised patient to discuss changes in level or intensity of pain, unresolved pain, questions and concerns with provider; Screening for signs and symptoms of depression related to chronic disease state;  Assessed social determinant of health barriers;  Review of fall prevention and safety concerns  Symptom Management: Take medications as prescribed   Attend all scheduled provider appointments Call  provider office for new concerns or questions  call the Suicide and Crisis Lifeline: 988 call the [place] National Suicide Prevention Lifeline: (817)799-3280 or TTY: 402-420-7601 TTY 2231228610) to talk to a trained counselor call 1-800-273-TALK (toll free, 24 hour hotline) if experiencing a Mental Health or Behavioral Health Crisis   Follow Up Plan: Telephone follow up appointment with care management team member scheduled for: 11-20-2022 at 1145 am       CCM Expected Outcome:  Monitor, Self-Manage and Reduce Symptoms of: Parkinson's       Current Barriers:  Knowledge Deficits related to resources in the area for support in meeting the needs of the patient as his Parkinson's advances and needs change  Care Coordination needs related to support from the CCM team, education needs, and ongoing support for effective management of Parkinson's and other chronic conditions in a patient with Parkinson's Disease  Chronic Disease Management support and education needs related to effective management of PD  Planned Interventions: Evaluation of current treatment plan related to PD and patient's adherence to plan as established by provider. The patient has had 2 procedures on his back and will have a set block on 09-29-2022. This has improved his back pain 30 to 40%. The patient is working with his daughter some too on keeping his legs moving and walking.  Advised patient to call the [facility] after talking to the insurance and discussing with her husband if they want the pcp to order the hospital  bed. His wife did check on some beds but states that there are so many different ones to choose from. Encouraged her to go to the store and talk to the associates to figure out the bed that would be best for the patient. She went to [employer] supply and talked to the representative there and got some details. She wants to go to [employer]. She wants to get an appointment to see the pcp about getting a hospital bed ordered.  Will send a message to staff asking for assistance with getting the patient an appointment. She wants the bed to have an electric controller, short rails, and possibly a trapeze. Provided education to patient re: the CCM team, resources available, support for the patient, information about myChart: The patients wife wishes for the link to be sent to her email at [email or web address]. This has been sent today. The patients wife also advised the need to provide a DPR for the patient for the future of speaking to the team. The patient gave permission for the [facility] to speak to his wife [name] this call and any subsequent calls.  Reviewed medications with patient and discussed compliance. The patient currently does not have a lot of medications and his wife states she is handling them well. Review of pharm D support if needed.  Collaborated with pcp and LCSW for current expressed needs  regarding PD and advancement of PD and possible future needs. Has spoke with the LCSW and knows how to reach for new questions or concerns Provided patient with PD, falls and safety, and Air cabin crew related to resources to help with effective management of PD Social Work referral for education, support, and long term planning for the patient needs with the advancement of his PD Discussed plans with patient for ongoing care management follow up and provided patient with direct contact information for care management team Advised patient to discuss changes in PD, questions or concerns with provider Screening for signs and symptoms of depression related to chronic disease state  Assessed social determinant of health barriers Encouraged the patients wife to get a notebook dedicated to the patients health needs and write down questions as she or the patient thinks of questions in regard to DME needs, changes in conditions, medication questions, and other information important to meeting the needs of the  patient.  The patient currently receives meals from [employer], has a tub bench and rails in the bathroom, frame for over the commode, and a ramp. The wife is going to call insurance about a hospital bed and let the RNCM know the desires of the patient.  Other resource that may be helpful for the patient is [facility] in [place] at [phone]. Gave the patients wife the number to the [employer] in [place], [place] to check out for a lift chair or hospital bed with all the functions the patient may need. She may also view the webpage on Facebook: [employer] [facility]  Symptom Management: Take medications as prescribed   Attend all scheduled provider appointments Call provider office for new concerns or questions  call the Suicide and Crisis Lifeline: 988 call the [place] National Suicide Prevention Lifeline: (501)833-0643 or TTY: (279)353-4492 TTY 405-326-4554) to talk to a trained counselor call 1-800-273-TALK (toll free, 24 hour hotline) if experiencing a Mental Health or Behavioral Health Crisis   Follow Up Plan: Telephone follow up appointment with care management team member scheduled for: 11-20-2022 at 1145 am  Patient verbalizes understanding of instructions and care plan provided today and agrees to view in MyChart. Active MyChart status and patient understanding of how to access instructions and care plan via MyChart confirmed with patient.  Telephone follow up appointment with care management team member scheduled for: 11-20-2022 at 1145 am

## 2022-09-28 ENCOUNTER — Telehealth: Payer: Medicare HMO | Admitting: Psychiatry

## 2022-09-28 NOTE — Patient Instructions (Signed)

## 2022-09-28 NOTE — Progress Notes (Unsigned)
PROVIDER NOTE: Interpretation of information contained herein should be left to medically-trained personnel. Specific patient instructions are provided elsewhere under "Patient Instructions" section of medical record. This document was created in part using STT-dictation technology, any transcriptional errors that may result from this process are unintentional.  Patient: Joseph Arroyo Type: Established DOB: 1941/01/20 MRN: [id number] PCP: Smitty Cords, DO  Service: Procedure DOS: 09/29/2022 Setting: Ambulatory Location: Ambulatory outpatient facility Delivery: Face-to-face Provider: Oswaldo Done, MD Specialty: Interventional Pain Management Specialty designation: 09 Location: Outpatient facility Ref. Prov.: Delano Metz, MD       Interventional Therapy   Procedure: Lumbar Facet, Medial Branch Block(s)          Laterality: Bilateral  Level: L3, L4, L5, and S1 Medial Branch Level(s). Injecting these levels blocks the L4-5 and L5-S1 lumbar facet joints. Imaging: Fluoroscopic guidance         Anesthesia: Local anesthesia (1-2% Lidocaine) Anxiolysis: IV Versed         Sedation:                         DOS: 09/29/2022 Performed by: Oswaldo Done, MD  Primary Purpose: Diagnostic/Therapeutic Indications: Low back pain severe enough to impact quality of life or function. 1. Lumbosacral facet joint hypertrophy   2. Chronic low back pain (1ry area of Pain) (Bilateral) (R>L) w/o sciatica   3. DDD (degenerative disc disease), lumbosacral   4. 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 Osteoarthritis of facet joint of lumbar spine   6. Spondylosis without myelopathy or radiculopathy, lumbosacral region    NAS-11 Pain score:   Pre-procedure:  /10   Post-procedure:  /10     Position / Prep / Materials:  Position: Prone  Prep solution: DuraPrep (Iodine Povacrylex [0.7% available iodine] and Isopropyl Alcohol, 74% w/w) Area Prepped: Posterolateral  Lumbosacral Spine (Wide prep: From the lower border of the scapula down to the end of the tailbone and from flank to flank.)  Materials:  Tray: Block Needle(s):  Type: Spinal  Gauge (G): 22  Length: 3.5-in Qty: 4      H&P (Pre-op Assessment):  [name] is a 82 y.o. (year old), male patient, seen today for interventional treatment. He  has a past surgical history that includes Other surgical history (2002); Colonoscopy (2008); Stapedes surgery (Right, 2002); Cataract extraction (Right, 07/18/14); and Cataract extraction w/PHACO (Left, 08/01/2014). [name] has a current medication list which includes the following prescription(s): acetaminophen, alprazolam, b complex vitamins, baclofen, capsicum-garlic, carbidopa-levodopa, gabapentin, hawthorn, l-methylfolate, lutein, paroxetine, paroxetine, and saw palmetto (serenoa repens). His primarily concern today is the No chief complaint on file.  Initial Vital Signs:  Pulse/HCG Rate:    Temp:   Resp:   BP:   SpO2:    BMI: Estimated body mass index is 20.53 kg/m as calculated from the following:   Height as of 09/15/22: 5\' 8"  (1.727 m).   Weight as of 09/15/22: 135 lb (61.2 kg).  Risk Assessment: Allergies: Reviewed. He is allergic to prednisone and wheat.  Allergy Precautions: None required Coagulopathies: Reviewed. None identified.  Blood-thinner therapy: None at this time Active Infection(s): Reviewed. None identified. [name] is afebrile  Site Confirmation: [name] was asked to confirm the procedure and laterality before marking the site Procedure checklist: Completed Consent: Before the procedure and under the influence of no sedative(s), amnesic(s), or anxiolytics, the patient was informed of the treatment options, risks and possible complications. To fulfill our  ethical and legal obligations, as recommended by the American Medical Association's Code of Ethics, I have informed the patient of my clinical impression; the  nature and purpose of the treatment or procedure; the risks, benefits, and possible complications of the intervention; the alternatives, including doing nothing; the risk(s) and benefit(s) of the alternative treatment(s) or procedure(s); and the risk(s) and benefit(s) of doing nothing. The patient was provided information about the general risks and possible complications associated with the procedure. These may include, but are not limited to: failure to achieve desired goals, infection, bleeding, organ or nerve damage, allergic reactions, paralysis, and death. In addition, the patient was informed of those risks and complications associated to Spine-related procedures, such as failure to decrease pain; infection (i.e.: Meningitis, epidural or intraspinal abscess); bleeding (i.e.: epidural hematoma, subarachnoid hemorrhage, or any other type of intraspinal or peri-dural bleeding); organ or nerve damage (i.e.: Any type of peripheral nerve, nerve root, or spinal cord injury) with subsequent damage to sensory, motor, and/or autonomic systems, resulting in permanent pain, numbness, and/or weakness of one or several areas of the body; allergic reactions; (i.e.: anaphylactic reaction); and/or death. Furthermore, the patient was informed of those risks and complications associated with the medications. These include, but are not limited to: allergic reactions (i.e.: anaphylactic or anaphylactoid reaction(s)); adrenal axis suppression; blood sugar elevation that in diabetics may result in ketoacidosis or comma; water retention that in patients with history of congestive heart failure may result in shortness of breath, pulmonary edema, and decompensation with resultant heart failure; weight gain; swelling or edema; medication-induced neural toxicity; particulate matter embolism and blood vessel occlusion with resultant organ, and/or nervous system infarction; and/or aseptic necrosis of one or more joints. Finally, the  patient was informed that Medicine is not an exact science; therefore, there is also the possibility of unforeseen or unpredictable risks and/or possible complications that may result in a catastrophic outcome. The patient indicated having understood very clearly. We have given the patient no guarantees and we have made no promises. Enough time was given to the patient to ask questions, all of which were answered to the patient's satisfaction. Mr. Finkle has indicated that he wanted to continue with the procedure. Attestation: I, the ordering provider, attest that I have discussed with the patient the benefits, risks, side-effects, alternatives, likelihood of achieving goals, and potential problems during recovery for the procedure that I have provided informed consent. Date  Time: {CHL [facility]-PAIN TIME CHOICES:[id number]}   Pre-Procedure Preparation:  Monitoring: As per clinic protocol. Respiration, ETCO2, SpO2, BP, heart rate and rhythm monitor placed and checked for adequate function Safety Precautions: Patient was assessed for positional comfort and pressure points before starting the procedure. Time-out: I initiated and conducted the "Time-out" before starting the procedure, as per protocol. The patient was asked to participate by confirming the accuracy of the "Time Out" information. Verification of the correct person, site, and procedure were performed and confirmed by me, the nursing staff, and the patient. "Time-out" conducted as per Joint Commission's Universal Protocol (UP.01.01.01). Time:   Start Time:   hrs.  Description of Procedure:          Laterality: (see above) Targeted Levels: (see above)  Safety Precautions: Aspiration looking for blood return was conducted prior to all injections. At no point did we inject any substances, as a needle was being advanced. Before injecting, the patient was told to immediately notify me if he was experiencing any new onset of "ringing in the ears,  or metallic  taste in the mouth". No attempts were made at seeking any paresthesias. Safe injection practices and needle disposal techniques used. Medications properly checked for expiration dates. SDV (single dose vial) medications used. After the completion of the procedure, all disposable equipment used was discarded in the proper designated medical waste containers. Local Anesthesia: Protocol guidelines were followed. The patient was positioned over the fluoroscopy table. The area was prepped in the usual manner. The time-out was completed. The target area was identified using fluoroscopy. A 12-in long, straight, sterile hemostat was used with fluoroscopic guidance to locate the targets for each level blocked. Once located, the skin was marked with an approved surgical skin marker. Once all sites were marked, the skin (epidermis, dermis, and hypodermis), as well as deeper tissues (fat, connective tissue and muscle) were infiltrated with a small amount of a short-acting local anesthetic, loaded on a 10cc syringe with a 25G, 1.5-in  Needle. An appropriate amount of time was allowed for local anesthetics to take effect before proceeding to the next step. Local Anesthetic: Lidocaine 2.0% The unused portion of the local anesthetic was discarded in the proper designated containers. Technical description of process:  L2 Medial Branch Nerve Block (MBB): The target area for the L2 medial branch is at the junction of the postero-lateral aspect of the superior articular process and the superior, posterior, and medial edge of the transverse process of L3. Under fluoroscopic guidance, a Quincke needle was inserted until contact was made with os over the superior postero-lateral aspect of the pedicular shadow (target area). After negative aspiration for blood, 0.5 mL of the nerve block solution was injected without difficulty or complication. The needle was removed intact. L3 Medial Branch Nerve Block (MBB): The target  area for the L3 medial branch is at the junction of the postero-lateral aspect of the superior articular process and the superior, posterior, and medial edge of the transverse process of L4. Under fluoroscopic guidance, a Quincke needle was inserted until contact was made with os over the superior postero-lateral aspect of the pedicular shadow (target area). After negative aspiration for blood, 0.5 mL of the nerve block solution was injected without difficulty or complication. The needle was removed intact. L4 Medial Branch Nerve Block (MBB): The target area for the L4 medial branch is at the junction of the postero-lateral aspect of the superior articular process and the superior, posterior, and medial edge of the transverse process of L5. Under fluoroscopic guidance, a Quincke needle was inserted until contact was made with os over the superior postero-lateral aspect of the pedicular shadow (target area). After negative aspiration for blood, 0.5 mL of the nerve block solution was injected without difficulty or complication. The needle was removed intact. L5 Medial Branch Nerve Block (MBB): The target area for the L5 medial branch is at the junction of the postero-lateral aspect of the superior articular process and the superior, posterior, and medial edge of the sacral ala. Under fluoroscopic guidance, a Quincke needle was inserted until contact was made with os over the superior postero-lateral aspect of the pedicular shadow (target area). After negative aspiration for blood, 0.5 mL of the nerve block solution was injected without difficulty or complication. The needle was removed intact. S1 Medial Branch Nerve Block (MBB): The target area for the S1 medial branch is at the posterior and inferior 6 o'clock position of the L5-S1 facet joint. Under fluoroscopic guidance, the Quincke needle inserted for the L5 MBB was redirected until contact was made with os over the  inferior and postero aspect of the sacrum, at  the 6 o' clock position under the L5-S1 facet joint (Target area). After negative aspiration for blood, 0.5 mL of the nerve block solution was injected without difficulty or complication. The needle was removed intact.  Once the entire procedure was completed, the treated area was cleaned, making sure to leave some of the prepping solution back to take advantage of its long term bactericidal properties.         Illustration of the posterior view of the lumbar spine and the posterior neural structures. Laminae of L2 through S1 are labeled. DPRL5, dorsal primary ramus of L5; DPRS1, dorsal primary ramus of S1; DPR3, dorsal primary ramus of L3; FJ, facet (zygapophyseal) joint L3-L4; I, inferior articular process of L4; LB1, lateral branch of dorsal primary ramus of L1; IAB, inferior articular branches from L3 medial branch (supplies L4-L5 facet joint); IBP, intermediate branch plexus; MB3, medial branch of dorsal primary ramus of L3; NR3, third lumbar nerve root; S, superior articular process of L5; SAB, superior articular branches from L4 (supplies L4-5 facet joint also); TP3, transverse process of L3.   Facet Joint Innervation (* possible contribution)  L1-2 T12, L1 (L2*)  Medial Branch  L2-3 L1, L2 (L3*)         "          "  L3-4 L2, L3 (L4*)         "          "  L4-5 L3, L4 (L5*)         "          "  L5-S1 L4, L5, S1          "          "    There were no vitals filed for this visit.   End Time:   hrs.  Imaging Guidance (Spinal):          Type of Imaging Technique: Fluoroscopy Guidance (Spinal) Indication(s): Assistance in needle guidance and placement for procedures requiring needle placement in or near specific anatomical locations not easily accessible without such assistance. Exposure Time: Please see nurses notes. Contrast: None used. Fluoroscopic Guidance: I was personally present during the use of fluoroscopy. "Tunnel Vision Technique" used to obtain the best possible view of  the target area. Parallax error corrected before commencing the procedure. "Direction-depth-direction" technique used to introduce the needle under continuous pulsed fluoroscopy. Once target was reached, antero-posterior, oblique, and lateral fluoroscopic projection used confirm needle placement in all planes. Images permanently stored in EMR. Interpretation: No contrast injected. I personally interpreted the imaging intraoperatively. Adequate needle placement confirmed in multiple planes. Permanent images saved into the patient's record.  Post-operative Assessment:  Post-procedure Vital Signs:  Pulse/HCG Rate:    Temp:   Resp:   BP:   SpO2:    EBL: None  Complications: No immediate post-treatment complications observed by team, or reported by patient.  Note: The patient tolerated the entire procedure well. A repeat set of vitals were taken after the procedure and the patient was kept under observation following institutional policy, for this type of procedure. Post-procedural neurological assessment was performed, showing return to baseline, prior to discharge. The patient was provided with post-procedure discharge instructions, including a section on how to identify potential problems. Should any problems arise concerning this procedure, the patient was given instructions to immediately contact us, at any time, without hesitation. In any case, we plan to contact  the patient by telephone for a follow-up status report regarding this interventional procedure.  Comments:  No additional relevant information.  Plan of Care (POC)  Orders:  No orders of the defined types were placed in this encounter.  Chronic Opioid Analgesic:  No chronic opioid analgesics therapy prescribed by our practice. None MME/day: 0 mg/day   Medications ordered for procedure: No orders of the defined types were placed in this encounter.  Medications administered: Doreene Adas L. Whidbee had no medications administered  during this visit.  See the medical record for exact dosing, route, and time of administration.  Follow-up plan:   No follow-ups on file.       Interventional Therapies  Risk Factors  Considerations:   Drug hypersensitivity: Oral prednisone (hyperactivity) Movement disorder (Parkinson's dyskinesia)  unsteady gait  tremor  Hx. Seizures   Carotid stenosis  orthostatic hypotension  anxiety/depression  GERD      Levoscoliosis (L3-4 apex)     Planned  Pending:   Diagnostic bilateral L4-5, L5-S1 lumbar facet (L3-S1) MBB #1    Under consideration:   Therapeutic right L1-2 LESI #3  Diagnostic bilateral L2-3 & L3-4 lumbar facet (L1-4) MBB #2  Diagnostic x-rays of the cervical spine for further evaluation of his cervicalgia.   Completed:   Therapeutic right (L2-3, L3-4) lumbar facet RFA x1 (06/25/2022) (50/50/30/40)  Therapeutic left (L2-3, L3-4) lumbar facet RFA x1 (08/04/2022) (50/50/30/30-40)  Diagnostic/therapeutic bilateral L2 + L3 TFESI x1 (05/07/2022) (50/50/25/20)  Diagnostic bilateral L2-3 & L3-4 Facet Blk (L1-4 MBB) x1 (12/23/2021) (75/75/75 x2 weeks)  Diagnostic bilateral lumbar facet (L2-S1) MBB x1 (07/08/2021) (50/50/50/<50)  Therapeutic right L1-2 LESI x2 (01/27/2022) (1st:100/100/50/50) (2nd: 75/75/50/50)  Diagnostic right L2-3 LESI x1 (06/03/2021) (100/100/45/45)  Referral to physical therapy for LB PT as well as a back brace eval. (04/28/2021) (water aerobics)    Therapeutic  Palliative (PRN) options:   None w/o F2F evaluation by provider.   Pharmacotherapy  Drug hypersensitivity: Oral prednisone (hyperactivity)    Pharmacologic Recommendations  For a more complete explanation please see the dictation in the body of my 05/21/2022 note. Oxycodone 5 mg PO TID to QID PRN for pain.            Recent Visits Date Type Provider Dept  09/15/22 Office Visit Delano Metz, MD [facility]  08/04/22 Procedure visit Delano Metz, MD [facility]  Showing recent visits within past 90 days and meeting all other requirements Future Appointments Date Type Provider Dept  09/29/22 Appointment Delano Metz, MD [facility]  Showing future appointments within next 90 days and meeting all other requirements  Disposition: Discharge home  Discharge (Date  Time): 09/29/2022;   hrs.   Primary Care Physician: Smitty Cords, DO Location: [facility] Note by: Oswaldo Done, MD (TTS technology used. I apologize for any typographical errors that were not detected and corrected.) Date: 09/29/2022; Time: 12:35 PM  Disclaimer:  Medicine is not an Visual merchandiser. The only guarantee in medicine is that nothing is guaranteed. It is important to note that the decision to proceed with this intervention was based on the information collected from the patient. The Data and conclusions were drawn from the patient's questionnaire, the interview, and the physical examination. Because the information was provided in large part by the patient, it cannot be guaranteed that it has not been purposely or unconsciously manipulated. Every effort has been made to obtain as much relevant data as possible for this evaluation. It  is important to note that the conclusions that lead to this procedure are derived in large part from the available data. Always take into account that the treatment will also be dependent on availability of resources and existing treatment guidelines, considered by other Pain Management Practitioners as being common knowledge and practice, at the time of the intervention. For Medico-Legal purposes, it is also important to point out that variation in procedural techniques and pharmacological choices are the acceptable norm. The indications, contraindications, technique, and results of the above procedure should only be interpreted and judged by a Board-Certified Interventional Pain  Specialist with extensive familiarity and expertise in the same exact procedure and technique.

## 2022-09-29 ENCOUNTER — Telehealth: Payer: Medicare HMO | Admitting: Psychiatry

## 2022-09-29 ENCOUNTER — Ambulatory Visit: Payer: Self-pay | Admitting: *Deleted

## 2022-09-29 ENCOUNTER — Ambulatory Visit (HOSPITAL_BASED_OUTPATIENT_CLINIC_OR_DEPARTMENT_OTHER): Payer: Medicare HMO | Admitting: Pain Medicine

## 2022-09-29 ENCOUNTER — Ambulatory Visit: Payer: Medicare HMO | Admitting: Pain Medicine

## 2022-09-29 ENCOUNTER — Telehealth: Payer: Self-pay | Admitting: Pain Medicine

## 2022-09-29 DIAGNOSIS — M47817 Spondylosis without myelopathy or radiculopathy, lumbosacral region: Secondary | ICD-10-CM

## 2022-09-29 DIAGNOSIS — M2428 Disorder of ligament, vertebrae: Secondary | ICD-10-CM

## 2022-09-29 DIAGNOSIS — Z5189 Encounter for other specified aftercare: Secondary | ICD-10-CM

## 2022-09-29 DIAGNOSIS — M47816 Spondylosis without myelopathy or radiculopathy, lumbar region: Secondary | ICD-10-CM

## 2022-09-29 DIAGNOSIS — Z91199 Patient's noncompliance with other medical treatment and regimen due to unspecified reason: Secondary | ICD-10-CM

## 2022-09-29 DIAGNOSIS — G8929 Other chronic pain: Secondary | ICD-10-CM

## 2022-09-29 DIAGNOSIS — M5137 Other intervertebral disc degeneration, lumbosacral region: Secondary | ICD-10-CM

## 2022-09-29 NOTE — Telephone Encounter (Signed)
Spoke with patient's wife, she reports patient is feeling "light-headed."  Spoke with Dr. Laban Emperor, advised that patient reschedule.

## 2022-09-29 NOTE — Telephone Encounter (Signed)
It sounds like he has issue with Vertigo. This can be neurological related to his parkinsons.  I would suggest Home Epley maneuver exercise to help try to reset and treat the Vertigo dizziness.  Regardless, they should contact his [facility]rologist to notify them.  Saralyn Pilar, DO [facility] 09/29/2022, 12:16 PM

## 2022-09-29 NOTE — Telephone Encounter (Signed)
PT wife called stated that patient started to experience being light headed last night. PT wife stated that he is still feeling light headed this morning. PT wife wants to know if he should come in for procedure today or reschedule. Please give patient wife a call. TY

## 2022-09-29 NOTE — Telephone Encounter (Signed)
light headed   Per agent: "Pt started getting light head last night.  Pt had a pain clinic procedure scheduled for today.  Wife called them this am to advise of the situation.  They would not do the procedure.  Would like to speak w/ someone concerning this."      Chief Complaint: Dizziness Symptoms: Spinning at times "Feels like I'm still moving when I lie down." Lightheaded when up walking, using walker (baseline) no additional assist.  Frequency: Yesterday afternoon Pertinent Negatives: Patient denies headache Disposition: [] ED /[] Urgent Care (no appt availability in office) / [] Appointment(In office/virtual)/ []  [facility]/ [] Home Care/ [] Refused Recommended Disposition /[] [facility]/ [x]  Follow-up with PCP Additional Notes:  Pt had procedure  scheduled for today, block, clinic cancelled. Due to symptoms. Also reports some mild sinus drainage, mild cough last night. BP this AM  126/73 HR51 sitting. Called practice , [facility], for consult. Advised to send to practice for PCPs review. Pt and wife advised.  Advised ED for worsening symptoms, care advise proved, verbalizes understanding.   Reason for Disposition  [1] MODERATE dizziness (e.g., vertigo; feels very unsteady, interferes with normal activities) AND [2] has NOT been evaluated by doctor (or NP/PA) for this  Answer Assessment - Initial Assessment Questions 1. DESCRIPTION: "Describe your dizziness."     Spinning at times 2. VERTIGO: "Do you feel like either you or the room is spinning or tilting?"      At times spinning, always lightheaded. 3. LIGHTHEADED: "Do you feel lightheaded?" (e.g., somewhat faint, woozy, weak upon standing)     Yes 4. SEVERITY: "How bad is it?"  "Can you walk?"   - MILD: Feels slightly dizzy and unsteady, but is walking normally.   - MODERATE: Feels unsteady when walking, but not falling; interferes with normal activities (e.g., school, work).   - SEVERE: Unable to walk without  falling, or requires assistance to walk without falling.     Varies 5. ONSET:  "When did the dizziness begin?"     Yesterday afternoon 6. AGGRAVATING FACTORS: "Does anything make it worse?" (e.g., standing, change in head position)     Sitting to standing 7. CAUSE: "What do you think is causing the dizziness?"     Unsure 8. RECURRENT SYMPTOM: "Have you had dizziness before?" If Yes, ask: "When was the last time?" "What happened that time?"     no 9. OTHER SYMPTOMS: "Do you have any other symptoms?" (e.g., headache, weakness, numbness, vomiting, earache)     BP wrist cuff  126/73  HR 51   Sitting.  Protocols used: Dizziness - Vertigo-A-AH

## 2022-09-29 NOTE — Telephone Encounter (Signed)
Pt advised.   Thanks,   -Laura  

## 2022-09-30 ENCOUNTER — Telehealth: Payer: Self-pay | Admitting: Family Medicine

## 2022-09-30 DIAGNOSIS — M1990 Unspecified osteoarthritis, unspecified site: Secondary | ICD-10-CM

## 2022-09-30 DIAGNOSIS — G20C Parkinsonism, unspecified: Secondary | ICD-10-CM

## 2022-09-30 NOTE — Telephone Encounter (Signed)
Pt advised.  Apt he agreed to see Denny Peon on 10/02/2022.    Thanks,   -Vernona Rieger

## 2022-09-30 NOTE — Telephone Encounter (Signed)
Pt wife called and stated Pt is still dizzy this morning, swimmy-headed. Stated they reached out to the neurologist office and stated that because we are not in the [facility] system, they couldn't put in an order for pt, and he does not have an appointment until Monday. Wife stated they ask that Dr. put in orders for Urine to check on UTI and any possible metabolic infection needs to know if he needs to come in fasting or not.  Wife is requesting a callback.

## 2022-09-30 NOTE — Telephone Encounter (Signed)
I have reviewed chart, this is similar call from yesterday. We advised to try Home Epley maneuvers for Vertigo symptoms.  I agree that they should follow up with Neurologist. I am not familiar with that schedule or timeline. But it sounds like they are contacting them. And they are not able to place these orders.  We cannot just order the UA and Blood work without evaluating him. And I will be out of town soon, I don't feel that is an appropriate way to manage his concerns.  I would suggest Urgent Care visit or schedule acute visit with Denny Peon here on Friday if possible for eval and testing as needed after her evaluation.  Saralyn Pilar, DO [facility] 09/30/2022, 9:49 AM

## 2022-10-02 ENCOUNTER — Encounter: Payer: Self-pay | Admitting: Physician Assistant

## 2022-10-02 ENCOUNTER — Ambulatory Visit (INDEPENDENT_AMBULATORY_CARE_PROVIDER_SITE_OTHER): Payer: Medicare HMO | Admitting: Physician Assistant

## 2022-10-02 VITALS — BP 114/62 | HR 67 | Temp 96.8°F

## 2022-10-02 DIAGNOSIS — R42 Dizziness and giddiness: Secondary | ICD-10-CM | POA: Diagnosis not present

## 2022-10-02 LAB — CBC WITH DIFFERENTIAL/PLATELET
Absolute Monocytes: 470 cells/uL (ref 200–950)
Basophils Absolute: 61 cells/uL (ref 0–200)
Basophils Relative: 1 %
Eosinophils Absolute: 201 cells/uL (ref 15–500)
Eosinophils Relative: 3.3 %
HCT: 41.3 % (ref 38.5–50.0)
Hemoglobin: 14.2 g/dL (ref 13.2–17.1)
Lymphs Abs: 1305 cells/uL (ref 850–3900)
MCH: 32.1 pg (ref 27.0–33.0)
MCHC: 34.4 g/dL (ref 32.0–36.0)
MCV: 93.4 fL (ref 80.0–100.0)
MPV: 9.9 fL (ref 7.5–12.5)
Monocytes Relative: 7.7 %
Neutro Abs: 4063 cells/uL (ref 1500–7800)
Neutrophils Relative %: 66.6 %
Platelets: 207 10*3/uL (ref 140–400)
RBC: 4.42 10*6/uL (ref 4.20–5.80)
RDW: 11.7 % (ref 11.0–15.0)
Total Lymphocyte: 21.4 %
WBC: 6.1 10*3/uL (ref 3.8–10.8)

## 2022-10-02 LAB — POCT URINALYSIS DIPSTICK
Bilirubin, UA: NEGATIVE
Blood, UA: NEGATIVE
Glucose, UA: NEGATIVE
Ketones, UA: NEGATIVE
Leukocytes, UA: NEGATIVE
Nitrite, UA: NEGATIVE
Protein, UA: NEGATIVE
Spec Grav, UA: 1.02 (ref 1.010–1.025)
Urobilinogen, UA: 0.2 E.U./dL
pH, UA: 5 (ref 5.0–8.0)

## 2022-10-02 NOTE — Progress Notes (Signed)
Acute Office Visit   Patient: Joseph Arroyo   DOB: 14-Feb-1941   82 y.o. Male  MRN: [id number] Visit Date: 10/02/2022  Today's healthcare provider: Oswaldo Conroy , PA-C  Introduced myself to the patient as a Secondary school teacher and provided education on APPs in clinical practice.    Chief Complaint  Patient presents with   Dizziness        Subjective    HPI HPI     Dizziness    Additional comments:        Last edited by , Oswaldo Conroy, PA-C on 10/02/2022  2:26 PM.        DIZZINESS Duration: days- started this past Monday  Description of symptoms: lightheaded- occurring even when laying down  Duration of episode: minutes Dizziness frequency: no history of the same Provoking factors:  see below  Aggravating factors:   laying down makes it worse  Triggered by rolling over in bed: yes Triggered by bending over: yes Aggravated by head movement: yes Aggravated by exertion, coughing, loud noises: no Recent head injury: yes- states he fell about 2 weeks ago and hit his head. Thinks dizziness was already present prior to fall. He denies LOC during/after fall. He reports turning in his chair quickly and feeling dizzy then falling Recent or current viral symptoms: no History of vasovagal episodes: no Nausea: no Vomiting: no Tinnitus: yes Hearing loss: yes- remote  Aural fullness: yes- particularly the left ear  Headache: Sometimes prickling sensation across forehead  Photophobia/phonophobia: yes thinks sounds seem to bother him  Unsteady gait:  patient has underlying Parkinsons but feels like he is getting more locked up lately  Postural instability: yes- underlying parkinsons  Diplopia, dysarthria, dysphagia or weakness: yes- reports new onset of trouble swallowing pills over the past few months  Related to exertion: no Pallor: no Diaphoresis: no Dyspnea: no Chest pain: yes- reports it is increased when he is stressed and siting upright can be painful. States this has  been ongoing and there have not been changes to his chest pain recently      Medications: Outpatient Medications Prior to Visit  Medication Sig   acetaminophen (TYLENOL) 500 MG tablet Take 1,000 mg by mouth every 8 (eight) hours as needed.   ALPRAZolam (XANAX) 0.25 MG tablet Take 1 tablet (0.25 mg total) by mouth 3 (three) times daily as needed for anxiety.   B Complex Vitamins (VITAMIN B COMPLEX PO) Take 1 tablet by mouth daily.   baclofen (LIORESAL) 10 MG tablet Take 0.5-1 tablets (5-10 mg total) by mouth 3 (three) times daily as needed for muscle spasms.   Capsicum-Garlic 200-300 MG CAPS Take 629-528 mg by mouth.   carbidopa-levodopa (SINEMET IR) 25-100 MG tablet Take 2.5 tablets by mouth 3 (three) times daily.   gabapentin (NEURONTIN) 100 MG capsule 1 IN AM AND NOON AND 2 AT NIGHT   HAWTHORNE BERRY PO Take by mouth daily.   L-Methylfolate 15 MG TABS Take 1 tablet (15 mg total) by mouth daily.   Lutein 10 MG TABS Take 1 tablet by mouth 3 (three) times daily. 1 daily   PARoxetine (PAXIL) 20 MG tablet TAKE 1 TABLET BY MOUTH EVERY DAY (WITH THE 40 MG TABLET FOR 60 MG TOTAL)   PARoxetine (PAXIL) 40 MG tablet TAKE 1 TABLET BY MOUTH EVERY DAY (WITH THE 20 MG TABLET FOR 60 MG TOTAL)   Saw Palmetto, Serenoa repens, 1000 MG CAPS Take 2 capsules by mouth daily.  No facility-administered medications prior to visit.    Review of Systems  Neurological:  Positive for dizziness and light-headedness.    See HPI for related ROS       Objective    BP 114/62 (BP Location: Right Arm, Patient Position: Sitting, Cuff Size: Normal)   Pulse 67   Temp (!) 96.8 F (36 C) (Temporal)   SpO2 96%      Physical Exam Vitals reviewed.  Constitutional:      General: He is awake.     Appearance: Normal appearance. He is well-developed and well-groomed.  HENT:     Head: Normocephalic and atraumatic.  Cardiovascular:     Rate and Rhythm: Normal rate and regular rhythm.     Pulses: Normal pulses.           Radial pulses are 2+ on the right side and 2+ on the left side.     Heart sounds: Normal heart sounds. No murmur heard.    No friction rub. No gallop.  Pulmonary:     Effort: Pulmonary effort is normal.     Breath sounds: Normal breath sounds.  Musculoskeletal:     Cervical back: Normal range of motion and neck supple.  Lymphadenopathy:     Head:     Right side of head: No submental, submandibular or preauricular adenopathy.     Left side of head: No submental, submandibular or preauricular adenopathy.     Cervical:     Right cervical: No superficial or posterior cervical adenopathy.    Left cervical: No superficial or posterior cervical adenopathy.  Neurological:     Mental Status: He is alert and oriented to person, place, and time.     GCS: GCS eye subscore is 4. GCS verbal subscore is 5. GCS motor subscore is 6.     Cranial Nerves: Dysarthria present. No cranial nerve deficit or facial asymmetry.     Motor: Weakness, tremor and abnormal muscle tone present.     Comments: Unable to assess gait  Comments: MENTAL STATUS: AAOx3, memory intact, fund of knowledge appropriate   LANG/SPEECH: Naming and repetition intact, fluent,  dysarthria present, fanswers questions appropriately     CRANIAL NERVES:   II: Pupils equal and reactive, no RAPD   III, IV, VI: EOM intact, no gaze preference or deviation, no nystagmus.   V: normal sensation in V1, V2, and V3 segments bilaterally   VII: no asymmetry, no nasolabial fold flattening   VIII: normal hearing to speech   IX, X: normal palatal elevation, no uvular deviation   XI: 5/5 head turn and 5/5 shoulder shrug bilaterally   XII: midline tongue protrusion   MOTOR:  4/5 bilateral grip strength    COORD:  tremor      Psychiatric:        Behavior: Behavior is cooperative.       Results for orders placed or performed in visit on 10/02/22  POCT urinalysis dipstick  Result Value Ref Range   Color, UA     Clarity, UA      Glucose, UA Negative Negative   Bilirubin, UA Negative    Ketones, UA Negative    Spec Grav, UA 1.020 1.010 - 1.025   Blood, UA Negative    pH, UA 5.0 5.0 - 8.0   Protein, UA Negative Negative   Urobilinogen, UA 0.2 0.2 or 1.0 E.U./dL   Nitrite, UA Negative    Leukocytes, UA Negative Negative   Appearance     Odor  Assessment & Plan      No follow-ups on file.       Problem List Items Addressed This Visit       Other   Dizziness - Primary    Acute, appears to be worsening per patient Reports exacerbations with rapid movements and laying in bed  Unsure of etiology but could be exacerbation of Parkinson's  Reviewed UA results with patient and his wife during apt- does not appear suspicious for UTI at this time Will check CBC and CMP today for evaluation Recommend he continues to use Meclizine as needed Will place referral to PT for vestibular rehab to assist with resolution Recommend he follows up with Neurology as planned for further evaluation Follow up as needed for persistent or progressing symptoms        Relevant Orders   POCT urinalysis dipstick (Completed)   CBC w/Diff/Platelet   COMPLETE METABOLIC PANEL WITH GFR   Ambulatory referral to Physical Therapy     No follow-ups on file.   I,  E , PA-C, have reviewed all documentation for this visit. The documentation on 10/02/22 for the exam, diagnosis, procedures, and orders are all accurate and complete.   Jacquelin Hawking, MHS, PA-C [facility]

## 2022-10-02 NOTE — Assessment & Plan Note (Addendum)
Acute, appears to be worsening per patient Reports exacerbations with rapid movements and laying in bed  Unsure of etiology but could be exacerbation of Parkinson's  Reviewed UA results with patient and his wife during apt- does not appear suspicious for UTI at this time Will check CBC and CMP today for evaluation Recommend he continues to use Meclizine as needed Will place referral to PT for vestibular rehab to assist with resolution Recommend he follows up with Neurology as planned for further evaluation Follow up as needed for persistent or progressing symptoms

## 2022-10-05 DIAGNOSIS — G20B1 Parkinson's disease with dyskinesia, without mention of fluctuations: Secondary | ICD-10-CM | POA: Diagnosis not present

## 2022-10-05 DIAGNOSIS — H8110 Benign paroxysmal vertigo, unspecified ear: Secondary | ICD-10-CM | POA: Diagnosis not present

## 2022-10-05 NOTE — Progress Notes (Signed)
Your CBC looks to be normal - no sign of anemia or elevated white blood cells Your electrolytes appear to be in normal range for your baseline and your kidneys and liver results are in normal ranges.

## 2022-10-06 ENCOUNTER — Telehealth: Payer: Self-pay | Admitting: Pain Medicine

## 2022-10-06 ENCOUNTER — Telehealth: Payer: Self-pay

## 2022-10-06 NOTE — Telephone Encounter (Signed)
Would like to speak with Nurse about new med and dizziness.

## 2022-10-06 NOTE — Telephone Encounter (Signed)
Wife called stating that the patient has been diagnosed with Benign Postural Vertigo and wanted to make sure that it was ok to continue with the procedure. Will speak to Dr Laban Emperor.

## 2022-10-06 NOTE — Telephone Encounter (Signed)
Spoke with Dr Laban Emperor.  He states that yes, we can proceed with the procedure as long as the patient is able to walk and move around ok.  Informed the patients wife that it was a safety issue at this point.  Also informed her that they needed to be very careful when taking the Hydrocodone as well due to it may cause dizziness on top of the vertigo.  Patients wife states understanding that the risk of fall is greater.

## 2022-10-07 ENCOUNTER — Telehealth: Payer: Self-pay | Admitting: Family Medicine

## 2022-10-07 NOTE — Telephone Encounter (Signed)
Copied from CRM [id number]. Topic: Medicare AWV >> Oct 07, 2022  2:12 PM Payton Doughty wrote: Reason for CRM: LM 10/07/2022 to schedule AWV   Verlee Rossetti; Care Guide Ambulatory Clinical Support [place] [facility] Direct Dial: [phone]

## 2022-10-08 ENCOUNTER — Ambulatory Visit: Payer: Medicare HMO | Admitting: Pain Medicine

## 2022-10-13 ENCOUNTER — Ambulatory Visit: Payer: Medicare HMO | Attending: Pain Medicine | Admitting: Pain Medicine

## 2022-10-13 ENCOUNTER — Ambulatory Visit
Admission: RE | Admit: 2022-10-13 | Discharge: 2022-10-13 | Disposition: A | Discharge status: Home / Self Care | Payer: Medicare HMO | Source: Ambulatory Visit | Attending: Pain Medicine | Admitting: Pain Medicine

## 2022-10-13 ENCOUNTER — Encounter: Payer: Self-pay | Admitting: Pain Medicine

## 2022-10-13 VITALS — BP 103/74 | HR 62 | Temp 97.9°F | Resp 14 | Ht 68.0 in | Wt 135.0 lb

## 2022-10-13 DIAGNOSIS — G8929 Other chronic pain: Secondary | ICD-10-CM | POA: Diagnosis not present

## 2022-10-13 DIAGNOSIS — M5459 Other low back pain: Secondary | ICD-10-CM | POA: Diagnosis not present

## 2022-10-13 DIAGNOSIS — M545 Low back pain, unspecified: Secondary | ICD-10-CM | POA: Insufficient documentation

## 2022-10-13 DIAGNOSIS — M47817 Spondylosis without myelopathy or radiculopathy, lumbosacral region: Secondary | ICD-10-CM | POA: Insufficient documentation

## 2022-10-13 DIAGNOSIS — M47816 Spondylosis without myelopathy or radiculopathy, lumbar region: Secondary | ICD-10-CM | POA: Diagnosis not present

## 2022-10-13 DIAGNOSIS — G20B1 Parkinson's disease with dyskinesia, without mention of fluctuations: Secondary | ICD-10-CM | POA: Insufficient documentation

## 2022-10-13 MED ORDER — LACTATED RINGERS IV SOLN: Freq: Once | INTRAVENOUS | Status: AC

## 2022-10-13 MED ORDER — ROPIVACAINE HCL 2 MG/ML IJ SOLN
INTRAMUSCULAR | Status: AC
  Filled 2022-10-13: qty 20

## 2022-10-13 MED ORDER — PENTAFLUOROPROP-TETRAFLUOROETH EX AERO
INHALATION_SPRAY | Freq: Once | CUTANEOUS | Status: DC
  Filled 2022-10-13: qty 30

## 2022-10-13 MED ORDER — ROPIVACAINE HCL 2 MG/ML IJ SOLN
18.0000 mL | Freq: Once | INTRAMUSCULAR | Status: AC
  Administered 2022-10-13: 18 mL via PERINEURAL

## 2022-10-13 MED ORDER — TRIAMCINOLONE ACETONIDE 40 MG/ML IJ SUSP
80.0000 mg | Freq: Once | INTRAMUSCULAR | Status: AC
  Administered 2022-10-13: 80 mg

## 2022-10-13 MED ORDER — MIDAZOLAM HCL 2 MG/2ML IJ SOLN
0.5000 mg | Freq: Once | INTRAMUSCULAR | Status: AC
  Administered 2022-10-13: 2 mg via INTRAVENOUS
  Filled 2022-10-13: qty 2

## 2022-10-13 MED ORDER — LIDOCAINE HCL 2 % IJ SOLN
20.0000 mL | Freq: Once | INTRAMUSCULAR | Status: AC
  Administered 2022-10-13: 400 mg

## 2022-10-13 MED ORDER — LIDOCAINE HCL 2 % IJ SOLN
INTRAMUSCULAR | Status: AC
  Filled 2022-10-13: qty 20

## 2022-10-13 MED ORDER — TRIAMCINOLONE ACETONIDE 40 MG/ML IJ SUSP
INTRAMUSCULAR | Status: AC
  Filled 2022-10-13: qty 2

## 2022-10-13 NOTE — Progress Notes (Signed)
PROVIDER NOTE: Interpretation of information contained herein should be left to medically-trained personnel. Specific patient instructions are provided elsewhere under "Patient Instructions" section of medical record. This document was created in part using STT-dictation technology, any transcriptional errors that may result from this process are unintentional.  Patient: Joseph Arroyo Type: Established DOB: 1941/01/24 MRN: [id number] PCP: Smitty Cords, DO  Service: Procedure DOS: 10/13/2022 Setting: Ambulatory Location: Ambulatory outpatient facility Delivery: Face-to-face Provider: Oswaldo Done, MD Specialty: Interventional Pain Management Specialty designation: 09 Location: Outpatient facility Ref. Prov.: Saralyn Pilar *       Interventional Therapy   Procedure: Lumbar Facet, Medial Branch Block(s) #1  Laterality: Bilateral  Level: L3, L4, L5, and S1 Medial Branch Level(s). Injecting these levels blocks the L4-5 and L5-S1 lumbar facet joints. Imaging: Fluoroscopic guidance         Anesthesia: Local anesthesia (1-2% Lidocaine) Anxiolysis: IV Versed 2.0 mg Sedation: No Sedation                       DOS: 10/13/2022 Performed by: Oswaldo Done, MD  Primary Purpose: Diagnostic/Therapeutic Indications: Low back pain severe enough to impact quality of life or function. 1. Chronic low back pain (1ry area of Pain) (Bilateral) (R>L) w/o sciatica   2. Lumbar facet joint pain   3. Lumbar facet syndrome (Bilateral)   4. Lumbosacral facet joint hypertrophy   5. Lumbosacral facet arthropathy (Left: L3-4, L4-5, and L5-S1)   6. Osteoarthritis of facet joint of lumbar spine   7. Spondylosis without myelopathy or radiculopathy, lumbosacral region   8. Parkinson's disease with dyskinesia, unspecified whether manifestations fluctuate    NAS-11 Pain score:   Pre-procedure: 5 /10   Post-procedure: 8 /10     Position / Prep / Materials:  Position: Prone   Prep solution: DuraPrep (Iodine Povacrylex [0.7% available iodine] and Isopropyl Alcohol, 74% w/w) Area Prepped: Posterolateral Lumbosacral Spine (Wide prep: From the lower border of the scapula down to the end of the tailbone and from flank to flank.)  Materials:  Tray: Block Needle(s):  Type: Spinal  Gauge (G): 22  Length: 3.5-in Qty: 4      H&P (Pre-op Assessment):  Joseph Arroyo is a 82 y.o. (year old), male patient, seen today for interventional treatment. He  has a past surgical history that includes Other surgical history (2002); Colonoscopy (2008); Stapedes surgery (Right, 2002); Cataract extraction (Right, 07/18/14); and Cataract extraction w/PHACO (Left, 08/01/2014). Joseph Arroyo has a current medication list which includes the following prescription(s): acetaminophen, alprazolam, b complex vitamins, baclofen, capsicum-garlic, carbidopa-levodopa, gabapentin, hawthorn, l-methylfolate, lutein, paroxetine, paroxetine, and saw palmetto (serenoa repens), and the following Facility-Administered Medications: lactated ringers and pentafluoroprop-tetrafluoroeth. His primarily concern today is the Back Pain  Initial Vital Signs:  Pulse/HCG Rate: 62ECG Heart Rate: (!) 58 Temp: (!) 97 F (36.1 C) Resp: 18 BP: (!) 83/58 SpO2: 97 %  BMI: Estimated body mass index is 20.53 kg/m as calculated from the following:   Height as of this encounter: 5\' 8"  (1.727 m).   Weight as of this encounter: 135 lb (61.2 kg).  Risk Assessment: Allergies: Reviewed. He is allergic to prednisone and wheat.  Allergy Precautions: None required Coagulopathies: Reviewed. None identified.  Blood-thinner therapy: None at this time Active Infection(s): Reviewed. None identified. Joseph Arroyo is afebrile  Site Confirmation: Joseph Arroyo was asked to confirm the procedure and laterality before marking the site Procedure checklist: Completed Consent: Before the procedure and under the influence of no  sedative(s),  amnesic(s), or anxiolytics, the patient was informed of the treatment options, risks and possible complications. To fulfill our ethical and legal obligations, as recommended by the American Medical Association's Code of Ethics, I have informed the patient of my clinical impression; the nature and purpose of the treatment or procedure; the risks, benefits, and possible complications of the intervention; the alternatives, including doing nothing; the risk(s) and benefit(s) of the alternative treatment(s) or procedure(s); and the risk(s) and benefit(s) of doing nothing. The patient was provided information about the general risks and possible complications associated with the procedure. These may include, but are not limited to: failure to achieve desired goals, infection, bleeding, organ or nerve damage, allergic reactions, paralysis, and death. In addition, the patient was informed of those risks and complications associated to Spine-related procedures, such as failure to decrease pain; infection (i.e.: Meningitis, epidural or intraspinal abscess); bleeding (i.e.: epidural hematoma, subarachnoid hemorrhage, or any other type of intraspinal or peri-dural bleeding); organ or nerve damage (i.e.: Any type of peripheral nerve, nerve root, or spinal cord injury) with subsequent damage to sensory, motor, and/or autonomic systems, resulting in permanent pain, numbness, and/or weakness of one or several areas of the body; allergic reactions; (i.e.: anaphylactic reaction); and/or death. Furthermore, the patient was informed of those risks and complications associated with the medications. These include, but are not limited to: allergic reactions (i.e.: anaphylactic or anaphylactoid reaction(s)); adrenal axis suppression; blood sugar elevation that in diabetics may result in ketoacidosis or comma; water retention that in patients with history of congestive heart failure may result in shortness of breath, pulmonary edema, and  decompensation with resultant heart failure; weight gain; swelling or edema; medication-induced neural toxicity; particulate matter embolism and blood vessel occlusion with resultant organ, and/or nervous system infarction; and/or aseptic necrosis of one or more joints. Finally, the patient was informed that Medicine is not an exact science; therefore, there is also the possibility of unforeseen or unpredictable risks and/or possible complications that may result in a catastrophic outcome. The patient indicated having understood very clearly. We have given the patient no guarantees and we have made no promises. Enough time was given to the patient to ask questions, all of which were answered to the patient's satisfaction. Joseph Arroyo has indicated that he wanted to continue with the procedure. Attestation: I, the ordering provider, attest that I have discussed with the patient the benefits, risks, side-effects, alternatives, likelihood of achieving goals, and potential problems during recovery for the procedure that I have provided informed consent. Date  Time: 10/13/2022  9:57 AM   Pre-Procedure Preparation:  Monitoring: As per clinic protocol. Respiration, ETCO2, SpO2, BP, heart rate and rhythm monitor placed and checked for adequate function Safety Precautions: Patient was assessed for positional comfort and pressure points before starting the procedure. Time-out: I initiated and conducted the "Time-out" before starting the procedure, as per protocol. The patient was asked to participate by confirming the accuracy of the "Time Out" information. Verification of the correct person, site, and procedure were performed and confirmed by me, the nursing staff, and the patient. "Time-out" conducted as per Joint Commission's Universal Protocol (UP.01.01.01). Time: 1141 Start Time: 1141 hrs.  Description of Procedure:          Laterality: (see above) Targeted Levels: (see above)  Safety Precautions:  Aspiration looking for blood return was conducted prior to all injections. At no point did we inject any substances, as a needle was being advanced. Before injecting, the patient was told to  immediately notify me if he was experiencing any new onset of "ringing in the ears, or metallic taste in the mouth". No attempts were made at seeking any paresthesias. Safe injection practices and needle disposal techniques used. Medications properly checked for expiration dates. SDV (single dose vial) medications used. After the completion of the procedure, all disposable equipment used was discarded in the proper designated medical waste containers. Local Anesthesia: Protocol guidelines were followed. The patient was positioned over the fluoroscopy table. The area was prepped in the usual manner. The time-out was completed. The target area was identified using fluoroscopy. A 12-in long, straight, sterile hemostat was used with fluoroscopic guidance to locate the targets for each level blocked. Once located, the skin was marked with an approved surgical skin marker. Once all sites were marked, the skin (epidermis, dermis, and hypodermis), as well as deeper tissues (fat, connective tissue and muscle) were infiltrated with a small amount of a short-acting local anesthetic, loaded on a 10cc syringe with a 25G, 1.5-in  Needle. An appropriate amount of time was allowed for local anesthetics to take effect before proceeding to the next step. Local Anesthetic: Lidocaine 2.0% The unused portion of the local anesthetic was discarded in the proper designated containers. Technical description of process:  L2 Medial Branch Nerve Block (MBB): The target area for the L2 medial branch is at the junction of the postero-lateral aspect of the superior articular process and the superior, posterior, and medial edge of the transverse process of L3. Under fluoroscopic guidance, a Quincke needle was inserted until contact was made with os over  the superior postero-lateral aspect of the pedicular shadow (target area). After negative aspiration for blood, 0.5 mL of the nerve block solution was injected without difficulty or complication. The needle was removed intact. L3 Medial Branch Nerve Block (MBB): The target area for the L3 medial branch is at the junction of the postero-lateral aspect of the superior articular process and the superior, posterior, and medial edge of the transverse process of L4. Under fluoroscopic guidance, a Quincke needle was inserted until contact was made with os over the superior postero-lateral aspect of the pedicular shadow (target area). After negative aspiration for blood, 0.5 mL of the nerve block solution was injected without difficulty or complication. The needle was removed intact. L4 Medial Branch Nerve Block (MBB): The target area for the L4 medial branch is at the junction of the postero-lateral aspect of the superior articular process and the superior, posterior, and medial edge of the transverse process of L5. Under fluoroscopic guidance, a Quincke needle was inserted until contact was made with os over the superior postero-lateral aspect of the pedicular shadow (target area). After negative aspiration for blood, 0.5 mL of the nerve block solution was injected without difficulty or complication. The needle was removed intact. L5 Medial Branch Nerve Block (MBB): The target area for the L5 medial branch is at the junction of the postero-lateral aspect of the superior articular process and the superior, posterior, and medial edge of the sacral ala. Under fluoroscopic guidance, a Quincke needle was inserted until contact was made with os over the superior postero-lateral aspect of the pedicular shadow (target area). After negative aspiration for blood, 0.5 mL of the nerve block solution was injected without difficulty or complication. The needle was removed intact. S1 Medial Branch Nerve Block (MBB): The target area  for the S1 medial branch is at the posterior and inferior 6 o'clock position of the L5-S1 facet joint. Under fluoroscopic guidance,  the Quincke needle inserted for the L5 MBB was redirected until contact was made with os over the inferior and postero aspect of the sacrum, at the 6 o' clock position under the L5-S1 facet joint (Target area). After negative aspiration for blood, 0.5 mL of the nerve block solution was injected without difficulty or complication. The needle was removed intact.  Once the entire procedure was completed, the treated area was cleaned, making sure to leave some of the prepping solution back to take advantage of its long term bactericidal properties.         Illustration of the posterior view of the lumbar spine and the posterior neural structures. Laminae of L2 through S1 are labeled. DPRL5, dorsal primary ramus of L5; DPRS1, dorsal primary ramus of S1; DPR3, dorsal primary ramus of L3; FJ, facet (zygapophyseal) joint L3-L4; I, inferior articular process of L4; LB1, lateral branch of dorsal primary ramus of L1; IAB, inferior articular branches from L3 medial branch (supplies L4-L5 facet joint); IBP, intermediate branch plexus; MB3, medial branch of dorsal primary ramus of L3; NR3, third lumbar nerve root; S, superior articular process of L5; SAB, superior articular branches from L4 (supplies L4-5 facet joint also); TP3, transverse process of L3.   Facet Joint Innervation (* possible contribution)  L1-2 T12, L1 (L2*)  Medial Branch  L2-3 L1, L2 (L3*)         "          "  L3-4 L2, L3 (L4*)         "          "  L4-5 L3, L4 (L5*)         "          "  L5-S1 L4, L5, S1          "          "    Vitals:   10/13/22 1138 10/13/22 1145 10/13/22 1148 10/13/22 1158  BP: 132/83 120/83 104/84 103/74  Pulse:      Resp: 19 15 14 14   Temp:    97.9 F (36.6 C)  TempSrc:      SpO2: 98% 97% 97% 98%  Weight:      Height:         End Time: 1148 hrs.  Imaging Guidance (Spinal):           Type of Imaging Technique: Fluoroscopy Guidance (Spinal) Indication(s): Assistance in needle guidance and placement for procedures requiring needle placement in or near specific anatomical locations not easily accessible without such assistance. Exposure Time: Please see nurses notes. Contrast: None used. Fluoroscopic Guidance: I was personally present during the use of fluoroscopy. "Tunnel Vision Technique" used to obtain the best possible view of the target area. Parallax error corrected before commencing the procedure. "Direction-depth-direction" technique used to introduce the needle under continuous pulsed fluoroscopy. Once target was reached, antero-posterior, oblique, and lateral fluoroscopic projection used confirm needle placement in all planes. Images permanently stored in EMR. Interpretation: No contrast injected. I personally interpreted the imaging intraoperatively. Adequate needle placement confirmed in multiple planes. Permanent images saved into the patient's record.  Post-operative Assessment:  Post-procedure Vital Signs:  Pulse/HCG Rate: 62(!) 50 Temp: 97.9 F (36.6 C) Resp: 14 BP: 103/74 SpO2: 98 %  EBL: None  Complications: No immediate post-treatment complications observed by team, or reported by patient.  Note: The patient tolerated the entire procedure well. A repeat set of vitals were taken after the procedure and  the patient was kept under observation following institutional policy, for this type of procedure. Post-procedural neurological assessment was performed, showing return to baseline, prior to discharge. The patient was provided with post-procedure discharge instructions, including a section on how to identify potential problems. Should any problems arise concerning this procedure, the patient was given instructions to immediately contact us, at any time, without hesitation. In any case, we plan to contact the patient by telephone for a follow-up status  report regarding this interventional procedure.  Comments:  No additional relevant information.  Plan of Care (POC)  Orders:  Orders Placed This Encounter  Procedures   LUMBAR FACET(MEDIAL BRANCH NERVE BLOCK) MBNB    Scheduling Instructions:     Procedure: Lumbar facet block (AKA.: Lumbosacral medial branch nerve block)     Side: Bilateral     Level: L4-5, L5-S1, and TBD Facets (L3, L4, L5, S1, and TBD Medial Branch Nerves)     Sedation: Patient's choice.     Timeframe: Today    Order Specific Question:   Where will this procedure be performed?    Answer:   [facility]   DG [facility] C-ARM 1-60 MIN NO REPORT    Intraoperative interpretation by procedural physician at [facility].    Standing Status:   Standing    Number of Occurrences:   1    Order Specific Question:   Reason for exam:    Answer:   Assistance in needle guidance and placement for procedures requiring needle placement in or near specific anatomical locations not easily accessible without such assistance.   Informed Consent Details: Physician/Practitioner Attestation; Transcribe to consent form and obtain patient signature    Nursing Order: Transcribe to consent form and obtain patient signature. Note: Always confirm laterality of pain with Joseph Arroyo, before procedure.    Order Specific Question:   Physician/Practitioner attestation of informed consent for procedure/surgical case    Answer:   I, the physician/practitioner, attest that I have discussed with the patient the benefits, risks, side effects, alternatives, likelihood of achieving goals and potential problems during recovery for the procedure that I have provided informed consent.    Order Specific Question:   Procedure    Answer:   Lumbar Facet Block  under fluoroscopic guidance    Order Specific Question:   Physician/Practitioner performing the procedure    Answer:    A. Laban Emperor MD    Order Specific Question:    Indication/Reason    Answer:   Low Back Pain, with our without leg pain, due to Facet Joint Arthralgia (Joint Pain) Spondylosis (Arthritis of the Spine), without myelopathy or radiculopathy (Nerve Damage).   Provide equipment / supplies at bedside    Procedure tray: "Block Tray" (Disposable  single use) Skin infiltration needle: Regular 1.5-in, 25-G, (x1) Block Needle type: Spinal Amount/quantity: 4 Size: Regular (3.5-inch) Gauge: 22G    Standing Status:   Standing    Number of Occurrences:   1    Order Specific Question:   Specify    Answer:   Block Tray   Chronic Opioid Analgesic:  No chronic opioid analgesics therapy prescribed by our practice. None MME/day: 0 mg/day   Medications ordered for procedure: Meds ordered this encounter  Medications   lidocaine (XYLOCAINE) 2 % (with pres) injection 400 mg   pentafluoroprop-tetrafluoroeth (GEBAUERS) aerosol   lactated ringers infusion   ropivacaine (PF) 2 mg/mL (0.2%) (NAROPIN) injection 18 mL   triamcinolone acetonide (KENALOG-40) injection 80 mg   midazolam (  VERSED) injection 0.5-2 mg    Make sure Flumazenil is available in the pyxis when using this medication. If oversedation occurs, administer 0.2 mg IV over 15 sec. If after 45 sec no response, administer 0.2 mg again over 1 min; may repeat at 1 min intervals; not to exceed 4 doses (1 mg)   Medications administered: We administered lidocaine, lactated ringers, ropivacaine (PF) 2 mg/mL (0.2%), triamcinolone acetonide, and midazolam.  See the medical record for exact dosing, route, and time of administration.  Follow-up plan:   Return in about 2 weeks (around 10/27/2022) for (Face2F), (PPE).       Interventional Therapies  Risk Factors  Considerations:   Drug hypersensitivity: Oral prednisone (hyperactivity) Movement disorder (Parkinson's dyskinesia)  unsteady gait  tremor  Hx. Seizures   Carotid stenosis  orthostatic hypotension  anxiety/depression  GERD       Levoscoliosis (L3-4 apex)     Planned  Pending:   Diagnostic bilateral L4-5, L5-S1 lumbar facet (L3-S1) MBB #1    Under consideration:   Therapeutic right L1-2 LESI #3  Diagnostic bilateral L2-3 & L3-4 lumbar facet (L1-4) MBB #2  Diagnostic x-rays of the cervical spine for further evaluation of his cervicalgia.   Completed:   Therapeutic right (L2-3, L3-4) lumbar facet RFA x1 (06/25/2022) (50/50/30/40)  Therapeutic left (L2-3, L3-4) lumbar facet RFA x1 (08/04/2022) (50/50/30/30-40)  Diagnostic/therapeutic bilateral L2 + L3 TFESI x1 (05/07/2022) (50/50/25/20)  Diagnostic bilateral L2-3 & L3-4 Facet Blk (L1-4 MBB) x1 (12/23/2021) (75/75/75 x2 weeks)  Diagnostic bilateral lumbar facet (L2-S1) MBB x1 (07/08/2021) (50/50/50/<50)  Therapeutic right L1-2 LESI x2 (01/27/2022) (1st:100/100/50/50) (2nd: 75/75/50/50)  Diagnostic right L2-3 LESI x1 (06/03/2021) (100/100/45/45)  Referral to physical therapy for LB PT as well as a back brace eval. (04/28/2021) (water aerobics)    Therapeutic  Palliative (PRN) options:   None w/o F2F evaluation by provider.   Pharmacotherapy  Drug hypersensitivity: Oral prednisone (hyperactivity)    Pharmacologic Recommendations  For a more complete explanation please see the dictation in the body of my 05/21/2022 note. Oxycodone 5 mg PO TID to QID PRN for pain.            Recent Visits Date Type Provider Dept  09/15/22 Office Visit Delano Metz, MD [facility]  08/04/22 Procedure visit Delano Metz, MD [facility]  Showing recent visits within past 90 days and meeting all other requirements Today's Visits Date Type Provider Dept  10/13/22 Procedure visit Delano Metz, MD [facility]  Showing today's visits and meeting all other requirements Future Appointments Date Type Provider Dept  10/29/22 Appointment Delano Metz, MD [facility]  Showing future appointments within next 90 days and  meeting all other requirements  Disposition: Discharge home  Discharge (Date  Time): 10/13/2022; 1205 hrs.   Primary Care Physician: Smitty Cords, DO Location: [facility] Note by: Oswaldo Done, MD (TTS technology used. I apologize for any typographical errors that were not detected and corrected.) Date: 10/13/2022; Time: 12:12 PM  Disclaimer:  Medicine is not an Visual merchandiser. The only guarantee in medicine is that nothing is guaranteed. It is important to note that the decision to proceed with this intervention was based on the information collected from the patient. The Data and conclusions were drawn from the patient's questionnaire, the interview, and the physical examination. Because the information was provided in large part by the patient, it cannot be guaranteed that it has not been purposely or unconsciously manipulated. Every  effort has been made to obtain as much relevant data as possible for this evaluation. It is important to note that the conclusions that lead to this procedure are derived in large part from the available data. Always take into account that the treatment will also be dependent on availability of resources and existing treatment guidelines, considered by other Pain Management Practitioners as being common knowledge and practice, at the time of the intervention. For Medico-Legal purposes, it is also important to point out that variation in procedural techniques and pharmacological choices are the acceptable norm. The indications, contraindications, technique, and results of the above procedure should only be interpreted and judged by a Board-Certified Interventional Pain Specialist with extensive familiarity and expertise in the same exact procedure and technique.

## 2022-10-13 NOTE — Patient Instructions (Signed)

## 2022-10-14 ENCOUNTER — Telehealth: Payer: Self-pay

## 2022-10-14 NOTE — Telephone Encounter (Signed)
Post procedure follow up.  LM 

## 2022-10-19 ENCOUNTER — Telehealth: Payer: Self-pay | Admitting: Pain Medicine

## 2022-10-19 ENCOUNTER — Ambulatory Visit: Payer: Medicare HMO | Attending: Family Medicine

## 2022-10-19 DIAGNOSIS — M6281 Muscle weakness (generalized): Secondary | ICD-10-CM | POA: Diagnosis not present

## 2022-10-19 DIAGNOSIS — R262 Difficulty in walking, not elsewhere classified: Secondary | ICD-10-CM | POA: Diagnosis not present

## 2022-10-19 DIAGNOSIS — R2681 Unsteadiness on feet: Secondary | ICD-10-CM | POA: Diagnosis not present

## 2022-10-19 DIAGNOSIS — R42 Dizziness and giddiness: Secondary | ICD-10-CM | POA: Diagnosis not present

## 2022-10-19 NOTE — Telephone Encounter (Signed)
PT wife called stated that patient fail on Friday, she just wanted to let Naveira know. Wife states that patient didn't seek any medical attention.

## 2022-10-19 NOTE — Therapy (Signed)
OUTPATIENT PHYSICAL THERAPY VESTIBULAR EVALUATION     Patient Name: Joseph Arroyo MRN: [id number] DOB:1940-08-16, 82 y.o., male Today's Date: 10/20/2022  END OF SESSION:  PT End of Session - 10/19/22 1552     Visit Number 1    Number of Visits 25    Date for PT Re-Evaluation 01/11/23    PT Start Time 1448    PT Stop Time 1530    PT Time Calculation (min) 42 min    Equipment Utilized During Treatment Gait belt    Activity Tolerance Patient tolerated treatment well    Behavior During Therapy WFL for tasks assessed/performed             Past Medical History:  Diagnosis Date   Allergic rhinitis due to pollen 11/21/2007   Brachial neuritis or radiculitis NOS    Cervicalgia    Concussion    age 71 - s/p accident   Costal chondritis    Depression    Essential and other specified forms of tremor    GERD (gastroesophageal reflux disease)    in past   Lumbago    Occlusion and stenosis of carotid artery without mention of cerebral infarction    Seizures [facility])    age 33 - after concussion   Past Surgical History:  Procedure Laterality Date   CATARACT EXTRACTION Right 07/18/14   CATARACT EXTRACTION W/PHACO Left 08/01/2014   Procedure: CATARACT EXTRACTION PHACO AND INTRAOCULAR LENS PLACEMENT (IOC);  Surgeon: Lockie Mola, MD;  Location: [facility];  Service: Ophthalmology;  Laterality: Left;  IVA TOPICAL   COLONOSCOPY  2008   OTHER SURGICAL HISTORY  2002   ear surgery   STAPEDES SURGERY Right 2002   Patient Active Problem List   Diagnosis Date Noted   Dizziness 10/02/2022   Lumbar facet joint pain 06/25/2022   Osteoarthritis of facet joint of lumbar spine 06/25/2022   Lumbosacral facet joint hypertrophy 06/25/2022   Parkinson's disease with dyskinesia 01/27/2022   Dysphagia, pharyngeal phase 09/17/2021   Cervicalgia 08/05/2021   Spondylosis without myelopathy or radiculopathy, lumbosacral region 06/24/2021   Lumbar central spinal stenosis, w/o  neurogenic claudication (L4-5) 06/03/2021   Lumbar lateral recess stenosis (Bilateral: L2-3, L4-5) (Right: L3-4) 06/03/2021   Lumbosacral foraminal stenosis (Bilateral: L2-3, L3-4) (Left: L5-S1) 06/03/2021   Lumbar nerve root impingement (Right: L3 at L2-3 & L3-4) 06/03/2021   Ligamentum flavum hypertrophy (L3-4, L4-5) 06/03/2021   Abnormal MRI, lumbar spine (04/23/2021) 04/24/2021   Long term prescription benzodiazepine use (alprazolam) (Xanax) 03/24/2021   Grade 1 Retrolisthesis of L2/L3 (5 mm) and L3/L4 (3 mm) 03/24/2021   Levoscoliosis of lumbar spine (L3-4 apex) 03/24/2021   DDD (degenerative disc disease), lumbosacral 03/24/2021   Lumbosacral facet arthropathy (Left: L3-4, L4-5, and L5-S1) 03/24/2021   Tricompartment osteoarthritis of knee (Left) 03/24/2021   Baker cyst (Left) 03/24/2021   Chronic low back pain (1ry area of Pain) (Bilateral) (R>L) w/o sciatica 03/24/2021   Lumbar facet syndrome (Bilateral) 03/24/2021   Chronic lower extremity pain (2ry area of Pain) (Bilateral) (L>R) 03/24/2021   Lumbosacral radiculitis/sensory radiculopathy at L2 (Bilateral) 03/24/2021   Lumbosacral radiculitis/sensory radiculopathy at L3 (Bilateral) 03/24/2021   Chronic pain syndrome 03/23/2021   Pharmacologic therapy 03/23/2021   Disorder of skeletal system 03/23/2021   Problems influencing health status 03/23/2021   PAD (peripheral artery disease) (HCC) 10/01/2020   GAD (generalized anxiety disorder) 01/13/2018   MDD (major depressive disorder) 01/13/2018   Umbilical hernia 09/04/2017   Chronic knee pain (Left) 08/11/2017  Derangement of medial meniscus, posterior horn (Left) 08/11/2017   Vitamin D insufficiency 03/05/2016   BPH without obstruction/lower urinary tract symptoms 07/08/2015   Chronic fatigue 10/23/2014   Major depression, recurrent, full remission (HCC) 07/26/2013   Unsteady gait 07/26/2013   Orthostatic hypotension 07/26/2013   Depression 07/26/2013   Anxiety 06/23/2013    Chronic anxiety 06/23/2013   Tremor 05/18/2013   Bilateral carotid artery stenosis 08/30/2012    PCP: Smitty Cords, DO  REFERRING PROVIDER:   Mecum, Oswaldo Conroy, PA-C    REFERRING DIAG: R42 (ICD-10-CM) - Dizziness   THERAPY DIAG:  Dizziness and giddiness  Unsteadiness on feet  Difficulty in walking, not elsewhere classified  Muscle weakness (generalized)  ONSET DATE: August 1st 2024  Rationale for Evaluation and Treatment: Rehabilitation  SUBJECTIVE:   SUBJECTIVE STATEMENT: Pt presents to PT eval for dizziness and imbalance.       Pt accompanied by: significant other  PERTINENT HISTORY:   The pt is a pleasant 82 yo male referred to PT for dizziness. Pt known to clinic, previously seen for falls in the setting of PD.  Pt spouse present for eval and reports pt was seen by neurologist recently at [facility] following onset of dizziness about two-three weeks ago. He reports they think he has BPPV.  PT spouse reports he had blood work done at this visit which "didn't show anything" and was normal. Pt unsure if his symptoms come on when he is still at rest. He describes his symptoms as spinning. This can be triggered when he lies on his back and also when he sits up from supine. Spinning lasts for minutes. He reports no nausea.  Pt sometimes sees double when he looks to the side and this has been going on for weeks. Denies any one sided weakness or other changes. He has fallen 4 times in past 3 months. Pt spouse reports pt had a fall about a week prior to dizziness onset. Pt reports no neck pain, has hx of LBP. His spouse reports decline in his mobility since this started. Pt uses RW at baseline for ambulation. Pt PMH significant for Parkinson's disease, concussion, cervicalgia, depression, seizures, chronic L knee pain, chronic anxiety, DDD, PAD, chronic pain syndrome, chronic low back pain, please refer to chart for full, extensive, PMH. PAIN:  Are you having pain?  No  PRECAUTIONS: Fall    WEIGHT BEARING RESTRICTIONS: No  FALLS: Has patient fallen in last 6 months? Yes. Number of falls 4  LIVING ENVIRONMENT: via chart and confirmed by pt in session Lives with: lives with their spouse Lives in: House/apartment Stairs: 4 Has following equipment at home: Environmental consultant - 2 wheeled  PLOF:  needs assistance, pt with PD that affected his mobility/ADLs  PATIENT GOALS: get rid of dizziness  OBJECTIVE:   DIAGNOSTIC FINDINGS:  No recent pertinent imaging per chart  COGNITION: Overall cognitive status: Within functional limits for tasks assessed   SENSATION: Not tested  POSTURE:  Crouched posture, significant thoracic kyphosis, forward head posture  Cervical ROM:   AROM screen - pt exhibits cervical rotation bilat and flexion/ext that is pain-free that is sufficient for requirements of positional testing and vestibular testing  STRENGTH: deferred    BED MOBILITY:  Impaired due to mobility and dizziness with lying down/rolling  TRANSFERS: Assistive device utilized: Walker - 2 wheeled  Sit to stand: Mod A Stand to sit: Min A   GAIT: Gait pattern:  crouched/forward posture, decreased gait speed observed, decreased step-length bilat, movement  rigid, pt requires use of RW and close CGA Distance walked: 20 ft in clinic Assistive device utilized: Walker - 2 wheeled Level of assistance:  close CGA Comments: pt reports no dizziness with gait, no LOB during gait  FUNCTIONAL TESTS:  Timed up and go (TUG): deferred to future visit - deferred to future visit   PATIENT SURVEYS:  FOTO deferred  VESTIBULAR ASSESSMENT:  SYMPTOM BEHAVIOR:  Subjective history: pt with positionally triggered vertigo per report  Non-Vestibular symptoms: diplopia  Type of dizziness: Spinning/Vertigo and "World moves", unsteady  Frequency: with seated>supine and supine>seated  Duration: pt reports minutes   OCULOMOTOR EXAM: room light  Ocular Alignment:  normal  Ocular ROM: No Limitations  Spontaneous Nystagmus: absent  Gaze-Induced Nystagmus: absent  Smooth Pursuits: saccades   Saccades: dysmetria   Convergence/Divergence: not tested    VESTIBULAR - OCULAR REFLEX:   Slow VOR: slightly saccadic  VOR Cancellation: Comment: deferred HIT: WNL bilaterally      POSITIONAL TESTING: Right Dix-Hallpike: completed with vestibular goggles donned/fixation suppressed - first DH pt has jerk nystagmus, R torsional up beating <60 sec duration.  Second reassessment in [facility] pt exhibits possible ageotropic nystagmus, unclear if multi-canal involvement or canal conversion. Nystagmus duration >60 sec, but does appear to fatigue    Other tests:  Cross-cover test: no vertical skew   VESTIBULAR TREATMENT:                                                                                                   DATE:   CRM: Epley 2x to treat symptoms suggestive of possible R posterior canal canalithiasis.   Gufoni deferred due to time limitations  Reviewed post-maneuver precautions with pt and his spouse "take it easy for rest of day" do not try anything challenging to balance. Plan to follow-up again this week.  Pt able to ambulate with RW and close CGA x 20 ft to transport chair from treatment table following maneuver, reports no dizziness.  PATIENT EDUCATION: Education details: Person educated: Patient and Spouse Education method: Explanation, Facilities manager, Actor cues, and Verbal cues Education comprehension: verbalized understanding and returned demonstration  HOME EXERCISE PROGRAM:  GOALS:   Goals reviewed with patient? Yes regarding goals of maneuvers - majority of goal testing to be initiated next visit, deferred today due ot time  SHORT TERM GOALS: Target date: 12/01/2022   Patient will be independent in home exercise program to improve dizziness, balance and mobility for better safety and functional independence with ADLs. Baseline: to be  initiated future visit/as needed Goal status: INITIAL   LONG TERM GOALS: Target date: 01/12/2023    Patient will increase FOTO score to equal to or greater than     to demonstrate statistically significant improvement in mobility and quality of life.  Baseline: deferred due to time, to be completed next 1-2 visits Goal status: INITIAL  2.  Patient will reduce dizziness handicap inventory score to <50, for less dizziness with ADLs and increased safety with home and work tasks.  Baseline: deferred Goal status: INITIAL  3.  The pt will report at  least a 60% decrease in positional or movement triggered dizziness symptoms in order to indicate decreased fall risk and increased balance/safety with mobility.  Baseline: pt presents with decreased mobility due to dizziness, dizziness/vertigo triggered by particular positional; changes Goal status: INITIAL  4.  Patient will reduce timed up and go to <11 seconds to reduce fall risk and demonstrate improved transfer/gait ability.  Baseline: Pt to complete next 1-2 visits or as appropriate  Goal status: INITIAL   ASSESSMENT:  CLINICAL IMPRESSION: Patient is a pleasant 82 y.o. male with hx of Parkinson's disease who was seen today for physical therapy evaluation and treatment for dizziness and imbalance. Pt with mixed central and peripheral indicators on exam. Abnormal findings on occulomotor and VOR assessment include impaired saccades, slow VOR and smooth pursuits, likely consistent within the context of PD. Pt also found to have positive R Dix-Hallpike with R torsion upbeating nystagmus <60 sec duration. With second DH pt exhibits possible ageotropic nystagmus, unclear if pt has multi-canal involvement or canal conversion following DH. Pt was able to ambulate following CRMs with RW and reported no dizziness. Instructed pt and his spouse in post-maneuver precautions and plan to reassess next visit and complete more testing as indicated. The pt will  benefit from further skilled PT to address these deficits in order to decrease fall risk, dizziness and improve balance/QOL.     OBJECTIVE IMPAIRMENTS: Abnormal gait, decreased activity tolerance, decreased balance, decreased coordination, decreased mobility, difficulty walking, decreased strength, dizziness, impaired vision/preception, improper body mechanics, and postural dysfunction.   ACTIVITY LIMITATIONS: lifting, bending, squatting, stairs, transfers, bed mobility, toileting, dressing, hygiene/grooming, and locomotion level  PARTICIPATION LIMITATIONS: meal prep, cleaning, laundry, shopping, community activity, and yard work  PERSONAL FACTORS: Age, Fitness, and 3+ comorbidities: Pt PMH significant for Parkinson's disease, concussion, cervicalgia, depression, seizures, chronic L knee pain, chronic anxiety, DDD, PAD, chronic pain syndrome, chronic low back pain, please refer to chart for full, extensive, PMH.  are also affecting patient's functional outcome.   REHAB POTENTIAL: Good  CLINICAL DECISION MAKING: Evolving/moderate complexity  EVALUATION COMPLEXITY: Moderate   PLAN:  PT FREQUENCY: 1-2x/week  PT DURATION: 12 weeks  PLANNED INTERVENTIONS: Therapeutic exercises, Therapeutic activity, Neuromuscular re-education, Balance training, Gait training, Patient/Family education, Self Care, Joint mobilization, Stair training, Vestibular training, Canalith repositioning, Visual/preceptual remediation/compensation, DME instructions, Electrical stimulation, Wheelchair mobility training, Spinal mobilization, Cryotherapy, Moist heat, Splintting, Taping, Traction, Manual therapy, and Re-evaluation  PLAN FOR NEXT SESSION: reassess Dix-Hallpike on R, assess on L, Roll testing, CRM as needed, DGI, TUG   Baird Kay, PT 10/20/2022, 11:58 AM

## 2022-10-21 ENCOUNTER — Ambulatory Visit: Payer: Medicare HMO | Admitting: Family Medicine

## 2022-10-21 ENCOUNTER — Encounter: Payer: Self-pay | Admitting: Family Medicine

## 2022-10-21 VITALS — BP 114/68 | HR 65 | Temp 97.5°F | Wt 119.0 lb

## 2022-10-21 DIAGNOSIS — G894 Chronic pain syndrome: Secondary | ICD-10-CM

## 2022-10-21 DIAGNOSIS — G20B2 Parkinson's disease with dyskinesia, with fluctuations: Secondary | ICD-10-CM

## 2022-10-21 DIAGNOSIS — E785 Hyperlipidemia, unspecified: Secondary | ICD-10-CM

## 2022-10-21 DIAGNOSIS — R7309 Other abnormal glucose: Secondary | ICD-10-CM

## 2022-10-21 MED ORDER — OXYCODONE HCL 5 MG PO TABS
5.0000 mg | ORAL_TABLET | Freq: Three times a day (TID) | ORAL | 0 refills | Status: DC | PRN
Start: 2022-10-21 — End: 2023-01-25

## 2022-10-21 NOTE — Patient Instructions (Addendum)
Thank you for coming to the office today.  Consider Myrbetriq 25mg  for urinary symptoms Okay to discuss with the local Urologist if interested I do believe this is a type of Overactive Bladder related to the nerves, neurogenic bladder concern.  Re ordered Oxycodone 5mg  as needed up to max 3 times a day, or less as discussed if you take it 1-2 times per day, this should be sufficient quantity 45 pills, we can re order in 28 days, but please let us know 3-4 days in advance for order.  Keep up with Vestibular therapy.  I am uncertain the exact cause of the night sweats.  It may be related to the Parkinsons, as it can be a neurologic side effect.  Anxiety increased - this could be contributing to night sweats  Please send [place] the information on the Hospital Bed Orders. - let me know which location for the medical supplies / hospital bed and specs.  Please schedule a Follow-up Appointment to: Return in about 3 months (around 01/21/2023) for 3 month Parkinsons, Pain Management.  If you have any other questions or concerns, please feel free to call the office or send a message through MyChart. You may also schedule an earlier appointment if necessary.  Additionally, you may be receiving a survey about your experience at our office within a few days to 1 week by e-mail or mail. We value your feedback.  Saralyn Pilar, DO [facility], [place]

## 2022-10-21 NOTE — Progress Notes (Signed)
Subjective:    Patient ID: Joseph Arroyo, male    DOB: 03/03/1940, 82 y.o.   MRN: [id number]  Joseph Arroyo is a 82 y.o. male presenting on 10/21/2022 for No chief complaint on file.   HPI  Updates 10/13/22 he received facet block injection from Dr Laban Emperor 10/16/22 - Friday he fell (overall about 4 falls in past 3 months) Recently seen by Neurology and advised that dizziness could be a sign of possible infection Previously was evaluated on 10/02/22 here at [facility] by Erin Mecum PA and tested labs and urinalysis that was negative, normal CBC + CMET  He has been referred Vestibular therapy He has some increased Anxiety lately  Recently having night sweats  Due to night sweats we discussed, he can dose increase Gabapentin by 1-2 extra pills in evening and contact Dr Jennelle Human if successful.  Increased Steroid with injection can cause night sweats  OAB / Bladder Dysfunction Frequent urination, waking up at night Having accidents at night Trying planned voiding during day   Parkinson's Disease, dyskinesia Chronic Pain Syndrome Lumbar DDD, Scoliosis, Chronic Back Pain Generalized Anxiety Disorder   Followed by specialist team: - [facility], med management and PT - Meredith Staggers MD [facility] - Dr Laban Emperor [facility]   Today primary concern is his chronic pain that is secondary to Parkinsons and his lumbar DDD with scoliosis and arthritis back pain. Upcoming order for thermal Radiofrequency Ablation for his back. hey recommended that he follow up with his PCP for pain management opiate therapy, they did recommend Oxycodone IR 5mg  q 8 hr AS NEEDED and re evaluate after 30 days, may adjust dose up if needed   Currently Taking Tylenol 1000mg  THREE TIMES A DAY some missed doses, AS NEEDED with good results   Taking Ibuprofen more rarely     Generalized Anxiety is worsening in setting of decline in health. He is on Xanax 0.25mg  alprazolam AS NEEDED  per Psychiatry, asking about safety and interaction   They are interested in pursuing hospital bed at home.    He does require position that cannot be achieved by regular bed due to parkinsons and back pain scoliosis, he requires legs to be lifted and frequent position changes to avoid bed sore or pressure ulcer. He has muscle contracture of legs at times and weakness generalized with back and lower extremities. Tremors with parkinson interfere with his ability to transition and sit up in regular bed       10/13/2022   10:01 AM 08/04/2022    8:23 AM 06/25/2022    8:10 AM  Depression screen PHQ 2/9  Decreased Interest 0 0 0  Down, Depressed, Hopeless  0 0  PHQ - 2 Score 0 0 0    Social History   Tobacco Use   Smoking status: Never   Smokeless tobacco: Never  Vaping Use   Vaping status: Never Used  Substance Use Topics   Alcohol use: No    Alcohol/week: 0.0 standard drinks of alcohol   Drug use: No    Review of Systems Per HPI unless specifically indicated above     Objective:    BP 114/68 (BP Location: Left Arm, Patient Position: Sitting, Cuff Size: Normal)   Pulse 65   Temp (!) 97.5 F (36.4 C) (Temporal)   Wt 119 lb (54 kg)   SpO2 96%   BMI 18.09 kg/m   Wt Readings from Last 3 Encounters:  10/21/22 119 lb (54 kg)  10/13/22 135 lb (61.2 kg)  09/15/22 135 lb (61.2 kg)    Physical Exam Vitals and nursing note reviewed.  Constitutional:      General: He is not in acute distress.    Appearance: He is well-developed. He is not diaphoretic.     Comments: Chronically ill 82 yr male, laying down, mild discomfort with some back pain, he has some he is cooperative  HENT:     Head: Normocephalic and atraumatic.  Eyes:     General:        Right eye: No discharge.        Left eye: No discharge.     Conjunctiva/sclera: Conjunctivae normal.  Neck:     Thyroid: No thyromegaly.  Cardiovascular:     Rate and Rhythm: Regular rhythm. Bradycardia present.     Pulses: Normal  pulses.     Heart sounds: Normal heart sounds. No murmur heard. Pulmonary:     Effort: Pulmonary effort is normal. No respiratory distress.     Breath sounds: Normal breath sounds. No wheezing or rales.  Musculoskeletal:     Cervical back: Normal range of motion and neck supple.     Comments: requires assistance to stand and ambulate. Some loss of muscle mass density  Lymphadenopathy:     Cervical: No cervical adenopathy.  Skin:    General: Skin is warm and dry.     Findings: No erythema or rash.  Neurological:     Mental Status: He is alert and oriented to person, place, and time. Mental status is at baseline.     Motor: Weakness present.     Gait: Gait abnormal.     Comments: Tremors resting.  Psychiatric:        Behavior: Behavior normal.     Comments: Well groomed, good eye contact, normal speech and thoughts    Results for orders placed or performed in visit on 10/02/22  CBC w/Diff/Platelet  Result Value Ref Range   WBC 6.1 3.8 - 10.8 Thousand/uL   RBC 4.42 4.20 - 5.80 Million/uL   Hemoglobin 14.2 13.2 - 17.1 g/dL   HCT 30.1 60.1 - 09.3 %   MCV 93.4 80.0 - 100.0 fL   MCH 32.1 27.0 - 33.0 pg   MCHC 34.4 32.0 - 36.0 g/dL   RDW 23.5 57.3 - 22.0 %   Platelets 207 140 - 400 Thousand/uL   MPV 9.9 7.5 - 12.5 fL   Neutro Abs 4,063 1,500 - 7,800 cells/uL   Lymphs Abs 1,305 850 - 3,900 cells/uL   Absolute Monocytes 470 200 - 950 cells/uL   Eosinophils Absolute 201 15 - 500 cells/uL   Basophils Absolute 61 0 - 200 cells/uL   Neutrophils Relative % 66.6 %   Total Lymphocyte 21.4 %   Monocytes Relative 7.7 %   Eosinophils Relative 3.3 %   Basophils Relative 1.0 %  COMPLETE METABOLIC PANEL WITH GFR  Result Value Ref Range   Glucose, Bld 96 65 - 99 mg/dL   BUN 24 7 - 25 mg/dL   Creat 2.54 (L) 2.70 - 1.22 mg/dL   eGFR 94 > OR = 60 WC/BJS/2.83T5   BUN/Creatinine Ratio 35 (H) 6 - 22 (calc)   Sodium 134 (L) 135 - 146 mmol/L   Potassium 4.1 3.5 - 5.3 mmol/L   Chloride 97 (L)  98 - 110 mmol/L   CO2 28 20 - 32 mmol/L   Calcium 9.3 8.6 - 10.3 mg/dL   Total Protein 6.4 6.1 - 8.1  g/dL   Albumin 4.2 3.6 - 5.1 g/dL   Globulin 2.2 1.9 - 3.7 g/dL (calc)   AG Ratio 1.9 1.0 - 2.5 (calc)   Total Bilirubin 0.5 0.2 - 1.2 mg/dL   Alkaline phosphatase (APISO) 48 35 - 144 U/L   AST 17 10 - 35 U/L   ALT 7 (L) 9 - 46 U/L  POCT urinalysis dipstick  Result Value Ref Range   Color, UA     Clarity, UA     Glucose, UA Negative Negative   Bilirubin, UA Negative    Ketones, UA Negative    Spec Grav, UA 1.020 1.010 - 1.025   Blood, UA Negative    pH, UA 5.0 5.0 - 8.0   Protein, UA Negative Negative   Urobilinogen, UA 0.2 0.2 or 1.0 E.U./dL   Nitrite, UA Negative    Leukocytes, UA Negative Negative   Appearance     Odor        Assessment & Plan:   Problem List Items Addressed This Visit     Chronic pain syndrome (Chronic)   Relevant Medications   oxyCODONE (OXY IR/ROXICODONE) 5 MG immediate release tablet   Parkinson's disease with dyskinesia - Primary (Chronic)    Consider Myrbetriq 25mg  for urinary symptoms Okay to discuss with the local Urologist if interested I do believe this is a type of Overactive Bladder related to the nerves, neurogenic bladder concern.  Re ordered Oxycodone 5mg  as needed up to max 3 times a day, or less as discussed if you take it 1-2 times per day, this should be sufficient quantity 45 pills, we can re order in 28 days, but please let us know 3-4 days in advance for order.  Keep up with Vestibular therapy.  I am uncertain the exact cause of the night sweats.  It may be related to the Parkinsons, as it can be a neurologic side effect.  Anxiety increased - this could be contributing to night sweats  Please send [place] the information on the Hospital Bed Orders. - let me know which location for the medical supplies / hospital bed and specs.    Meds ordered this encounter  Medications   oxyCODONE (OXY IR/ROXICODONE) 5 MG immediate release  tablet    Sig: Take 1 tablet (5 mg total) by mouth 3 (three) times daily as needed for severe pain or moderate pain.    Dispense:  45 tablet    Refill:  0      Follow up plan: Return in about 3 months (around 01/21/2023) for 3 month Parkinsons, Pain Management.   Future labs. A1c TSH Lipid BMET   Saralyn Pilar, DO [facility] 10/21/2022, 2:47 PM

## 2022-10-22 ENCOUNTER — Encounter: Payer: Self-pay | Admitting: Family Medicine

## 2022-10-22 ENCOUNTER — Telehealth: Payer: Self-pay | Admitting: Family Medicine

## 2022-10-22 ENCOUNTER — Ambulatory Visit: Payer: Medicare HMO

## 2022-10-22 DIAGNOSIS — R2681 Unsteadiness on feet: Secondary | ICD-10-CM

## 2022-10-22 DIAGNOSIS — R42 Dizziness and giddiness: Secondary | ICD-10-CM

## 2022-10-22 DIAGNOSIS — R262 Difficulty in walking, not elsewhere classified: Secondary | ICD-10-CM

## 2022-10-22 NOTE — Telephone Encounter (Signed)
Copied from CRM [id number]. Topic: Medicare AWV >> Oct 22, 2022  9:45 AM Payton Doughty wrote: Reason for CRM: LM 10/22/2022 to schedule AWV   Verlee Rossetti; Care Guide Ambulatory Clinical Support [place] [facility] Direct Dial: [phone]

## 2022-10-22 NOTE — Therapy (Signed)
OUTPATIENT PHYSICAL THERAPY VESTIBULAR TREATMENT NOTE     Patient Name: Joseph Arroyo MRN: [id number] DOB:07-25-1940, 82 y.o., male Today's Date: 10/22/2022  END OF SESSION:  PT End of Session - 10/22/22 1013     Visit Number 2    Number of Visits 25    Date for PT Re-Evaluation 01/11/23    PT Start Time 1017    PT Stop Time 1045    PT Time Calculation (min) 28 min    Equipment Utilized During Treatment Gait belt    Activity Tolerance Patient tolerated treatment well    Behavior During Therapy WFL for tasks assessed/performed             Past Medical History:  Diagnosis Date   Allergic rhinitis due to pollen 11/21/2007   Brachial neuritis or radiculitis NOS    Cervicalgia    Concussion    age 59 - s/p accident   Costal chondritis    Depression    Essential and other specified forms of tremor    GERD (gastroesophageal reflux disease)    in past   Lumbago    Occlusion and stenosis of carotid artery without mention of cerebral infarction    Seizures [facility])    age 2 - after concussion   Past Surgical History:  Procedure Laterality Date   CATARACT EXTRACTION Right 07/18/14   CATARACT EXTRACTION W/PHACO Left 08/01/2014   Procedure: CATARACT EXTRACTION PHACO AND INTRAOCULAR LENS PLACEMENT (IOC);  Surgeon: Lockie Mola, MD;  Location: [facility];  Service: Ophthalmology;  Laterality: Left;  IVA TOPICAL   COLONOSCOPY  2008   OTHER SURGICAL HISTORY  2002   ear surgery   STAPEDES SURGERY Right 2002   Patient Active Problem List   Diagnosis Date Noted   Dizziness 10/02/2022   Lumbar facet joint pain 06/25/2022   Osteoarthritis of facet joint of lumbar spine 06/25/2022   Lumbosacral facet joint hypertrophy 06/25/2022   Parkinson's disease with dyskinesia 01/27/2022   Dysphagia, pharyngeal phase 09/17/2021   Cervicalgia 08/05/2021   Spondylosis without myelopathy or radiculopathy, lumbosacral region 06/24/2021   Lumbar central spinal stenosis,  w/o neurogenic claudication (L4-5) 06/03/2021   Lumbar lateral recess stenosis (Bilateral: L2-3, L4-5) (Right: L3-4) 06/03/2021   Lumbosacral foraminal stenosis (Bilateral: L2-3, L3-4) (Left: L5-S1) 06/03/2021   Lumbar nerve root impingement (Right: L3 at L2-3 & L3-4) 06/03/2021   Ligamentum flavum hypertrophy (L3-4, L4-5) 06/03/2021   Abnormal MRI, lumbar spine (04/23/2021) 04/24/2021   Long term prescription benzodiazepine use (alprazolam) (Xanax) 03/24/2021   Grade 1 Retrolisthesis of L2/L3 (5 mm) and L3/L4 (3 mm) 03/24/2021   Levoscoliosis of lumbar spine (L3-4 apex) 03/24/2021   DDD (degenerative disc disease), lumbosacral 03/24/2021   Lumbosacral facet arthropathy (Left: L3-4, L4-5, and L5-S1) 03/24/2021   Tricompartment osteoarthritis of knee (Left) 03/24/2021   Baker cyst (Left) 03/24/2021   Chronic low back pain (1ry area of Pain) (Bilateral) (R>L) w/o sciatica 03/24/2021   Lumbar facet syndrome (Bilateral) 03/24/2021   Chronic lower extremity pain (2ry area of Pain) (Bilateral) (L>R) 03/24/2021   Lumbosacral radiculitis/sensory radiculopathy at L2 (Bilateral) 03/24/2021   Lumbosacral radiculitis/sensory radiculopathy at L3 (Bilateral) 03/24/2021   Chronic pain syndrome 03/23/2021   Pharmacologic therapy 03/23/2021   Disorder of skeletal system 03/23/2021   Problems influencing health status 03/23/2021   PAD (peripheral artery disease) (HCC) 10/01/2020   GAD (generalized anxiety disorder) 01/13/2018   MDD (major depressive disorder) 01/13/2018   Umbilical hernia 09/04/2017   Chronic knee pain (Left) 08/11/2017  Derangement of medial meniscus, posterior horn (Left) 08/11/2017   Vitamin D insufficiency 03/05/2016   BPH without obstruction/lower urinary tract symptoms 07/08/2015   Chronic fatigue 10/23/2014   Major depression, recurrent, full remission (HCC) 07/26/2013   Unsteady gait 07/26/2013   Orthostatic hypotension 07/26/2013   Depression 07/26/2013   Anxiety  06/23/2013   Chronic anxiety 06/23/2013   Tremor 05/18/2013   Bilateral carotid artery stenosis 08/30/2012    PCP: Smitty Cords, DO  REFERRING PROVIDER:   Mecum, Oswaldo Conroy, PA-C    REFERRING DIAG: R42 (ICD-10-CM) - Dizziness   THERAPY DIAG:  Dizziness and giddiness  Difficulty in walking, not elsewhere classified  Unsteadiness on feet  ONSET DATE: August 1st 2024  Rationale for Evaluation and Treatment: Rehabilitation  SUBJECTIVE:   SUBJECTIVE STATEMENT: Pt didn't have symptoms laying down after previous appointment or much the next day. Pt reports last night he could feel it again. He is walking today with RW.  He says it is definitely back but not as pronounced as before.        Pt accompanied by: significant other  PERTINENT HISTORY:   The pt is a pleasant 82 yo male referred to PT for dizziness. Pt known to clinic, previously seen for falls in the setting of PD.  Pt spouse present for eval and reports pt was seen by neurologist recently at [facility] following onset of dizziness about two-three weeks ago. He reports they think he has BPPV.  PT spouse reports he had blood work done at this visit which "didn't show anything" and was normal. Pt unsure if his symptoms come on when he is still at rest. He describes his symptoms as spinning. This can be triggered when he lies on his back and also when he sits up from supine. Spinning lasts for minutes. He reports no nausea.  Pt sometimes sees double when he looks to the side and this has been going on for weeks. Denies any one sided weakness or other changes. He has fallen 4 times in past 3 months. Pt spouse reports pt had a fall about a week prior to dizziness onset. Pt reports no neck pain, has hx of LBP. His spouse reports decline in his mobility since this started. Pt uses RW at baseline for ambulation. Pt PMH significant for Parkinson's disease, concussion, cervicalgia, depression, seizures, chronic L knee pain, chronic  anxiety, DDD, PAD, chronic pain syndrome, chronic low back pain, please refer to chart for full, extensive, PMH. PAIN:  Are you having pain? No  PRECAUTIONS: Fall    WEIGHT BEARING RESTRICTIONS: No  FALLS: Has patient fallen in last 6 months? Yes. Number of falls 4  LIVING ENVIRONMENT: via chart and confirmed by pt in session Lives with: lives with their spouse Lives in: House/apartment Stairs: 4 Has following equipment at home: Dan Humphreys - 2 wheeled  PLOF:  needs assistance, pt with PD that affected his mobility/ADLs  PATIENT GOALS: get rid of dizziness  OBJECTIVE:   DIAGNOSTIC FINDINGS:  No recent pertinent imaging per chart  COGNITION: Overall cognitive status: Within functional limits for tasks assessed   SENSATION: Not tested  POSTURE:  Crouched posture, significant thoracic kyphosis, forward head posture  Cervical ROM:   AROM screen - pt exhibits cervical rotation bilat and flexion/ext that is pain-free that is sufficient for requirements of positional testing and vestibular testing  STRENGTH: deferred    BED MOBILITY:  Impaired due to mobility and dizziness with lying down/rolling  TRANSFERS: Assistive device utilized: Environmental consultant -  2 wheeled  Sit to stand: Mod A Stand to sit: Min A   GAIT: Gait pattern:  crouched/forward posture, decreased gait speed observed, decreased step-length bilat, movement rigid, pt requires use of RW and close CGA Distance walked: 20 ft in clinic Assistive device utilized: Walker - 2 wheeled Level of assistance:  close CGA Comments: pt reports no dizziness with gait, no LOB during gait  FUNCTIONAL TESTS:  Timed up and go (TUG): deferred to future visit - deferred to future visit   PATIENT SURVEYS:  FOTO deferred  VESTIBULAR ASSESSMENT:  SYMPTOM BEHAVIOR:  Subjective history: pt with positionally triggered vertigo per report  Non-Vestibular symptoms: diplopia  Type of dizziness: Spinning/Vertigo and "World moves",  unsteady  Frequency: with seated>supine and supine>seated  Duration: pt reports minutes   OCULOMOTOR EXAM: room light  Ocular Alignment: normal  Ocular ROM: No Limitations  Spontaneous Nystagmus: absent  Gaze-Induced Nystagmus: absent  Smooth Pursuits: saccades   Saccades: dysmetria   Convergence/Divergence: not tested    VESTIBULAR - OCULAR REFLEX:   Slow VOR: slightly saccadic  VOR Cancellation: Comment: deferred HIT: WNL bilaterally      POSITIONAL TESTING: Right Dix-Hallpike: completed with vestibular goggles donned/fixation suppressed - first DH pt has jerk nystagmus, R torsional up beating <60 sec duration.  Second reassessment in R [facility] pt exhibits possible ageotropic nystagmus, unclear if multi-canal involvement or canal conversion. Nystagmus duration >60 sec, but does appear to fatigue    Other tests:  Cross-cover test: no vertical skew   VESTIBULAR TREATMENT:                                                                                                   DATE: 10/22/22  CRM:  Dix-Hallpike R: reassessed today 2x. First DH pt positive, exhibits delayed onset, <20 sec jerk nystagmus that is up-beat and torsional to R. By second DH, pt with no dizziness or nystagmus present.  Epley 2x to treat symptoms suggestive of possible R posterior canal canalithiasis. No vertigo by second treatment, but pt reports some ligtheadedness upon sitting (chronic issue, pt spouse reports pt with hx of BP related ligtheadedness with positional changes). PT reviewed post-maneuver precautions.  Pt able to ambulate with RW and close CGA x 60 ft following maneuver with no dizziness. He did report some back discomfort (also chronic). Pt taken from clinic in transport chair back to vehicle at end of visit.     PATIENT EDUCATION: Education details: Pt educated throughout session about proper posture and technique with exercises. Improved exercise technique, movement at target joints, use of  target muscles after min to mod verbal, visual, tactile cues. Post-maneuver precautions.  Person educated: Patient and Spouse Education method: Explanation, Demonstration, Tactile cues, and Verbal cues Education comprehension: verbalized understanding and returned demonstration  HOME EXERCISE PROGRAM:  GOALS:   Goals reviewed with patient? Yes regarding goals of maneuvers - majority of goal testing to be initiated next visit, deferred today due ot time  SHORT TERM GOALS: Target date: 12/03/2022   Patient will be independent in home exercise program to improve dizziness, balance and  mobility for better safety and functional independence with ADLs. Baseline: to be initiated future visit/as needed Goal status: INITIAL   LONG TERM GOALS: Target date: 01/14/2023    Patient will increase FOTO score to equal to or greater than     to demonstrate statistically significant improvement in mobility and quality of life.  Baseline: deferred due to time, to be completed next 1-2 visits Goal status: INITIAL  2.  Patient will reduce dizziness handicap inventory score to <50, for less dizziness with ADLs and increased safety with home and work tasks.  Baseline: deferred Goal status: INITIAL  3.  The pt will report at least a 60% decrease in positional or movement triggered dizziness symptoms in order to indicate decreased fall risk and increased balance/safety with mobility.  Baseline: pt presents with decreased mobility due to dizziness, dizziness/vertigo triggered by particular positional; changes Goal status: INITIAL  4.  Patient will reduce timed up and go to <11 seconds to reduce fall risk and demonstrate improved transfer/gait ability.  Baseline: Pt to complete next 1-2 visits or as appropriate  Goal status: INITIAL   ASSESSMENT:  CLINICAL IMPRESSION: Pt presents to visit reporting improvement in symptoms since CRM at eval. Pt still exhibits positive R DH, where nystagmus is brief <20  sec. PT provided R Epley 2x. By second maneuver and DH reassessment pt without nystagmus or vertigo. He did experience some ligtheadedness upon sitting, but spouse reports that is a chronic issue. Pt able to ambulate well with CGA and RW following CRMs. Plan to reassess again next session and plan to include Roll Test. Plan to do FOTO. The pt will benefit from further skilled PT to address these deficits in order to decrease fall risk, dizziness and improve balance/QOL.     OBJECTIVE IMPAIRMENTS: Abnormal gait, decreased activity tolerance, decreased balance, decreased coordination, decreased mobility, difficulty walking, decreased strength, dizziness, impaired vision/preception, improper body mechanics, and postural dysfunction.   ACTIVITY LIMITATIONS: lifting, bending, squatting, stairs, transfers, bed mobility, toileting, dressing, hygiene/grooming, and locomotion level  PARTICIPATION LIMITATIONS: meal prep, cleaning, laundry, shopping, community activity, and yard work  PERSONAL FACTORS: Age, Fitness, and 3+ comorbidities: Pt PMH significant for Parkinson's disease, concussion, cervicalgia, depression, seizures, chronic L knee pain, chronic anxiety, DDD, PAD, chronic pain syndrome, chronic low back pain, please refer to chart for full, extensive, PMH.  are also affecting patient's functional outcome.   REHAB POTENTIAL: Good  CLINICAL DECISION MAKING: Evolving/moderate complexity  EVALUATION COMPLEXITY: Moderate   PLAN:  PT FREQUENCY: 1-2x/week  PT DURATION: 12 weeks  PLANNED INTERVENTIONS: Therapeutic exercises, Therapeutic activity, Neuromuscular re-education, Balance training, Gait training, Patient/Family education, Self Care, Joint mobilization, Stair training, Vestibular training, Canalith repositioning, Visual/preceptual remediation/compensation, DME instructions, Electrical stimulation, Wheelchair mobility training, Spinal mobilization, Cryotherapy, Moist heat, Splintting, Taping,  Traction, Manual therapy, and Re-evaluation  PLAN FOR NEXT SESSION: reassess Dix-Hallpike on R, assess on L, Roll testing, CRM as needed, DGI, TUG   Baird Kay, PT 10/22/2022, 10:58 AM

## 2022-10-23 ENCOUNTER — Other Ambulatory Visit: Payer: Medicare HMO

## 2022-10-23 DIAGNOSIS — R7309 Other abnormal glucose: Secondary | ICD-10-CM

## 2022-10-23 DIAGNOSIS — E785 Hyperlipidemia, unspecified: Secondary | ICD-10-CM

## 2022-10-23 DIAGNOSIS — G20B2 Parkinson's disease with dyskinesia, with fluctuations: Secondary | ICD-10-CM

## 2022-10-24 LAB — BASIC METABOLIC PANEL WITH GFR
BUN: 25 mg/dL (ref 7–25)
CO2: 32 mmol/L (ref 20–32)
Calcium: 9.1 mg/dL (ref 8.6–10.3)
Chloride: 101 mmol/L (ref 98–110)
Creat: 0.73 mg/dL (ref 0.70–1.22)
Glucose, Bld: 121 mg/dL (ref 65–139)
Potassium: 4.2 mmol/L (ref 3.5–5.3)
Sodium: 140 mmol/L (ref 135–146)
eGFR: 92 mL/min/{1.73_m2} (ref >=60)

## 2022-10-24 LAB — LIPID PANEL
Cholesterol: 141 mg/dL (ref <200)
HDL: 66 mg/dL (ref >=40)
LDL Cholesterol (Calc): 62 mg/dL
Non-HDL Cholesterol (Calc): 75 mg/dL (ref <130)
Total CHOL/HDL Ratio: 2.1 (calc) (ref <5.0)
Triglycerides: 53 mg/dL (ref <150)

## 2022-10-24 LAB — TSH: TSH: 0.99 m[IU]/L (ref 0.40–4.50)

## 2022-10-24 LAB — HEMOGLOBIN A1C
Hgb A1c MFr Bld: 5.2 %{Hb} (ref <5.7)
Mean Plasma Glucose: 103 mg/dL
eAG (mmol/L): 5.7 mmol/L

## 2022-10-28 ENCOUNTER — Ambulatory Visit: Payer: Medicare HMO

## 2022-10-28 DIAGNOSIS — R262 Difficulty in walking, not elsewhere classified: Secondary | ICD-10-CM | POA: Diagnosis not present

## 2022-10-28 DIAGNOSIS — R42 Dizziness and giddiness: Secondary | ICD-10-CM | POA: Diagnosis not present

## 2022-10-28 DIAGNOSIS — R2681 Unsteadiness on feet: Secondary | ICD-10-CM | POA: Diagnosis not present

## 2022-10-28 DIAGNOSIS — M6281 Muscle weakness (generalized): Secondary | ICD-10-CM | POA: Diagnosis not present

## 2022-10-28 NOTE — Therapy (Signed)
OUTPATIENT PHYSICAL THERAPY VESTIBULAR TREATMENT NOTE     Patient Name: Joseph Arroyo MRN: [id number] DOB:June 11, 1940, 82 y.o., male Today's Date: 10/28/2022  END OF SESSION:  PT End of Session - 10/28/22 1231     Visit Number 3    Number of Visits 25    Date for PT Re-Evaluation 01/11/23    PT Start Time 1140    PT Stop Time 1230    PT Time Calculation (min) 50 min    Equipment Utilized During Treatment Gait belt    Activity Tolerance Patient tolerated treatment well    Behavior During Therapy WFL for tasks assessed/performed              Past Medical History:  Diagnosis Date   Allergic rhinitis due to pollen 11/21/2007   Brachial neuritis or radiculitis NOS    Cervicalgia    Concussion    age 70 - s/p accident   Costal chondritis    Depression    Essential and other specified forms of tremor    GERD (gastroesophageal reflux disease)    in past   Lumbago    Occlusion and stenosis of carotid artery without mention of cerebral infarction    Seizures [facility])    age 58 - after concussion   Past Surgical History:  Procedure Laterality Date   CATARACT EXTRACTION Right 07/18/14   CATARACT EXTRACTION W/PHACO Left 08/01/2014   Procedure: CATARACT EXTRACTION PHACO AND INTRAOCULAR LENS PLACEMENT (IOC);  Surgeon: Lockie Mola, MD;  Location: [facility];  Service: Ophthalmology;  Laterality: Left;  IVA TOPICAL   COLONOSCOPY  2008   OTHER SURGICAL HISTORY  2002   ear surgery   STAPEDES SURGERY Right 2002   Patient Active Problem List   Diagnosis Date Noted   Dizziness 10/02/2022   Lumbar facet joint pain 06/25/2022   Osteoarthritis of facet joint of lumbar spine 06/25/2022   Lumbosacral facet joint hypertrophy 06/25/2022   Parkinson's disease with dyskinesia 01/27/2022   Dysphagia, pharyngeal phase 09/17/2021   Cervicalgia 08/05/2021   Spondylosis without myelopathy or radiculopathy, lumbosacral region 06/24/2021   Lumbar central spinal stenosis,  w/o neurogenic claudication (L4-5) 06/03/2021   Lumbar lateral recess stenosis (Bilateral: L2-3, L4-5) (Right: L3-4) 06/03/2021   Lumbosacral foraminal stenosis (Bilateral: L2-3, L3-4) (Left: L5-S1) 06/03/2021   Lumbar nerve root impingement (Right: L3 at L2-3 & L3-4) 06/03/2021   Ligamentum flavum hypertrophy (L3-4, L4-5) 06/03/2021   Abnormal MRI, lumbar spine (04/23/2021) 04/24/2021   Long term prescription benzodiazepine use (alprazolam) (Xanax) 03/24/2021   Grade 1 Retrolisthesis of L2/L3 (5 mm) and L3/L4 (3 mm) 03/24/2021   Levoscoliosis of lumbar spine (L3-4 apex) 03/24/2021   DDD (degenerative disc disease), lumbosacral 03/24/2021   Lumbosacral facet arthropathy (Left: L3-4, L4-5, and L5-S1) 03/24/2021   Tricompartment osteoarthritis of knee (Left) 03/24/2021   Baker cyst (Left) 03/24/2021   Chronic low back pain (1ry area of Pain) (Bilateral) (R>L) w/o sciatica 03/24/2021   Lumbar facet syndrome (Bilateral) 03/24/2021   Chronic lower extremity pain (2ry area of Pain) (Bilateral) (L>R) 03/24/2021   Lumbosacral radiculitis/sensory radiculopathy at L2 (Bilateral) 03/24/2021   Lumbosacral radiculitis/sensory radiculopathy at L3 (Bilateral) 03/24/2021   Chronic pain syndrome 03/23/2021   Pharmacologic therapy 03/23/2021   Disorder of skeletal system 03/23/2021   Problems influencing health status 03/23/2021   PAD (peripheral artery disease) (HCC) 10/01/2020   GAD (generalized anxiety disorder) 01/13/2018   MDD (major depressive disorder) 01/13/2018   Umbilical hernia 09/04/2017   Chronic knee pain (Left)  08/11/2017   Derangement of medial meniscus, posterior horn (Left) 08/11/2017   Vitamin D insufficiency 03/05/2016   BPH without obstruction/lower urinary tract symptoms 07/08/2015   Chronic fatigue 10/23/2014   Major depression, recurrent, full remission (HCC) 07/26/2013   Unsteady gait 07/26/2013   Orthostatic hypotension 07/26/2013   Depression 07/26/2013   Anxiety  06/23/2013   Chronic anxiety 06/23/2013   Tremor 05/18/2013   Bilateral carotid artery stenosis 08/30/2012    PCP: Smitty Cords, DO  REFERRING PROVIDER:   Mecum, Oswaldo Conroy, PA-C    REFERRING DIAG: R42 (ICD-10-CM) - Dizziness   THERAPY DIAG:  Dizziness and giddiness  Difficulty in walking, not elsewhere classified  Unsteadiness on feet  ONSET DATE: August 1st 2024  Rationale for Evaluation and Treatment: Rehabilitation  SUBJECTIVE:   SUBJECTIVE STATEMENT: Pt reports continued dizziness when laying down still. Pt spouse present thinks pt has been doing somewhat better since initial treatments.      Pt accompanied by: significant other and daughter  PERTINENT HISTORY:   The pt is a pleasant 82 yo male referred to PT for dizziness. Pt known to clinic, previously seen for falls in the setting of PD.  Pt spouse present for eval and reports pt was seen by neurologist recently at [facility] following onset of dizziness about two-three weeks ago. He reports they think he has BPPV.  PT spouse reports he had blood work done at this visit which "didn't show anything" and was normal. Pt unsure if his symptoms come on when he is still at rest. He describes his symptoms as spinning. This can be triggered when he lies on his back and also when he sits up from supine. Spinning lasts for minutes. He reports no nausea.  Pt sometimes sees double when he looks to the side and this has been going on for weeks. Denies any one sided weakness or other changes. He has fallen 4 times in past 3 months. Pt spouse reports pt had a fall about a week prior to dizziness onset. Pt reports no neck pain, has hx of LBP. His spouse reports decline in his mobility since this started. Pt uses RW at baseline for ambulation. Pt PMH significant for Parkinson's disease, concussion, cervicalgia, depression, seizures, chronic L knee pain, chronic anxiety, DDD, PAD, chronic pain syndrome, chronic low back pain, please  refer to chart for full, extensive, PMH. PAIN:  Are you having pain? No  PRECAUTIONS: Fall    WEIGHT BEARING RESTRICTIONS: No  FALLS: Has patient fallen in last 6 months? Yes. Number of falls 4  LIVING ENVIRONMENT: via chart and confirmed by pt in session Lives with: lives with their spouse Lives in: House/apartment Stairs: 4 Has following equipment at home: Environmental consultant - 2 wheeled  PLOF:  needs assistance, pt with PD that affected his mobility/ADLs  PATIENT GOALS: get rid of dizziness  OBJECTIVE: taken at eval unless specified otherwise  DIAGNOSTIC FINDINGS:  No recent pertinent imaging per chart  COGNITION: Overall cognitive status: Within functional limits for tasks assessed   SENSATION: Not tested  POSTURE:  Crouched posture, significant thoracic kyphosis, forward head posture  Cervical ROM:   AROM screen - pt exhibits cervical rotation bilat and flexion/ext that is pain-free that is sufficient for requirements of positional testing and vestibular testing    BED MOBILITY:  Impaired due to mobility and dizziness with lying down/rolling  TRANSFERS: Assistive device utilized: Walker - 2 wheeled  Sit to stand: Mod A Stand to sit: Min A  GAIT: Gait pattern:  crouched/forward posture, decreased gait speed observed, decreased step-length bilat, movement rigid, pt requires use of RW and close CGA Distance walked: 20 ft in clinic Assistive device utilized: Walker - 2 wheeled Level of assistance:  close CGA Comments: pt reports no dizziness with gait, no LOB during gait  FUNCTIONAL TESTS:  Timed up and go (TUG): deferred to future visit - deferred to future visit   PATIENT SURVEYS:  FOTO 44  VESTIBULAR ASSESSMENT:  SYMPTOM BEHAVIOR:  Subjective history: pt with positionally triggered vertigo per report  Non-Vestibular symptoms: diplopia  Type of dizziness: Spinning/Vertigo and "World moves", unsteady  Frequency: with seated>supine and  supine>seated  Duration: pt reports minutes   OCULOMOTOR EXAM: room light  Ocular Alignment: normal  Ocular ROM: No Limitations  Spontaneous Nystagmus: absent  Gaze-Induced Nystagmus: absent  Smooth Pursuits: saccades   Saccades: dysmetria   Convergence/Divergence: not tested    VESTIBULAR - OCULAR REFLEX:   Slow VOR: slightly saccadic  VOR Cancellation: Comment: deferred HIT: WNL bilaterally      POSITIONAL TESTING: Right Dix-Hallpike: completed with vestibular goggles donned/fixation suppressed - first DH pt has jerk nystagmus, R torsional up beating <60 sec duration.  Second reassessment in R [facility] pt exhibits possible ageotropic nystagmus, unclear if multi-canal involvement or canal conversion. Nystagmus duration >60 sec, but does appear to fatigue    Other tests:  Cross-cover test: no vertical skew   VESTIBULAR TREATMENT:                                                                                                   DATE: 10/28/22  Gait belt donned and PT providing CGA-mod a throughout to ensure pt safety   NMR: Roll Test: R side: no nystagmus but dizziness reported  L side: negative, no dizziness or nystagmus present L Dix-Hallpike: negative, no dizziness or nystagmus present  Dix-Hallpike R: reassessed and POSITIVE,jerk nystagmus <15 sec duration,  R up beating, torsional, pt reports severe sensation of dizziness that lasts >1 minute   CRM: Epley 2x to treat symptoms suggestive of R posterior canal canalithiasis. Pt continues to have no vertigo/symptoms by end of second maneuver. No reversal seen upon sitting. PT reviewed post-maneuver precautions.  Pt able to ambulate with RW and close CGA x 40 ft to waiting area. Transport chair called to take pt back to car/lobby.   PATIENT EDUCATION: Education details: Pt educated throughout session about proper posture and technique with exercises. Improved exercise technique, movement at target joints, use of target muscles  after min to mod verbal, visual, tactile cues. Post-maneuver precautions.  Person educated: Patient and Spouse Education method: Explanation, Demonstration, Tactile cues, and Verbal cues Education comprehension: verbalized understanding and returned demonstration  HOME EXERCISE PROGRAM:  GOALS:   Goals reviewed with patient? Yes regarding goals of maneuvers - majority of goal testing to be initiated next visit, deferred today due ot time  SHORT TERM GOALS: Target date: 12/09/2022   Patient will be independent in home exercise program to improve dizziness, balance and mobility for better safety and functional independence with ADLs. Baseline:  to be initiated future visit/as needed Goal status: INITIAL   LONG TERM GOALS: Target date: 01/20/2023    Patient will increase FOTO score to equal to or greater than 53    to demonstrate statistically significant improvement in mobility and quality of life.  Baseline: 44 Goal status: INITIAL  2.  Patient will reduce dizziness handicap inventory score to <50, for less dizziness with ADLs and increased safety with home and work tasks.  Baseline: deferred, will complete as pt able Goal status: INITIAL  3.  The pt will report at least a 60% decrease in positional or movement triggered dizziness symptoms in order to indicate decreased fall risk and increased balance/safety with mobility.  Baseline: pt presents with decreased mobility due to dizziness, dizziness/vertigo triggered by particular positional; changes Goal status: INITIAL  4.  Patient will reduce timed up and go to <11 seconds to reduce fall risk and demonstrate improved transfer/gait ability.  Baseline: Pt to complete as appropriate  Goal status: INITIAL   ASSESSMENT:  CLINICAL IMPRESSION:  Pt continues to present with positive R Dix-Hallpike with nystagmus of short duration, but severe sensation of dizziness >1 min. PT provided 2 more Epley maneuvers to treat R side with  emphasis on increased speed into initial position. All other positional testing was negative, though pt did feel slightly dizzy with R roll test. May need to attempt semont maneuver if assist available and pt able next visit should R DH continue to be positive. The pt will benefit from further skilled PT to address these deficits in order to decrease fall risk, dizziness and improve balance/QOL.     OBJECTIVE IMPAIRMENTS: Abnormal gait, decreased activity tolerance, decreased balance, decreased coordination, decreased mobility, difficulty walking, decreased strength, dizziness, impaired vision/preception, improper body mechanics, and postural dysfunction.   ACTIVITY LIMITATIONS: lifting, bending, squatting, stairs, transfers, bed mobility, toileting, dressing, hygiene/grooming, and locomotion level  PARTICIPATION LIMITATIONS: meal prep, cleaning, laundry, shopping, community activity, and yard work  PERSONAL FACTORS: Age, Fitness, and 3+ comorbidities: Pt PMH significant for Parkinson's disease, concussion, cervicalgia, depression, seizures, chronic L knee pain, chronic anxiety, DDD, PAD, chronic pain syndrome, chronic low back pain, please refer to chart for full, extensive, PMH.  are also affecting patient's functional outcome.   REHAB POTENTIAL: Good  CLINICAL DECISION MAKING: Evolving/moderate complexity  EVALUATION COMPLEXITY: Moderate   PLAN:  PT FREQUENCY: 1-2x/week  PT DURATION: 12 weeks  PLANNED INTERVENTIONS: Therapeutic exercises, Therapeutic activity, Neuromuscular re-education, Balance training, Gait training, Patient/Family education, Self Care, Joint mobilization, Stair training, Vestibular training, Canalith repositioning, Visual/preceptual remediation/compensation, DME instructions, Electrical stimulation, Wheelchair mobility training, Spinal mobilization, Cryotherapy, Moist heat, Splintting, Taping, Traction, Manual therapy, and Re-evaluation  PLAN FOR NEXT SESSION:  further testing as pt able, possibly semont if assist available   Baird Kay, PT 10/28/2022, 5:16 PM

## 2022-10-29 ENCOUNTER — Encounter: Payer: Self-pay | Admitting: Pain Medicine

## 2022-10-29 ENCOUNTER — Ambulatory Visit: Payer: Medicare HMO | Attending: Pain Medicine | Admitting: Pain Medicine

## 2022-10-29 VITALS — BP 90/75 | HR 69 | Temp 97.3°F | Ht 67.0 in | Wt 119.0 lb

## 2022-10-29 DIAGNOSIS — M47816 Spondylosis without myelopathy or radiculopathy, lumbar region: Secondary | ICD-10-CM | POA: Diagnosis not present

## 2022-10-29 DIAGNOSIS — G8929 Other chronic pain: Secondary | ICD-10-CM | POA: Diagnosis not present

## 2022-10-29 DIAGNOSIS — G20B1 Parkinson's disease with dyskinesia, without mention of fluctuations: Secondary | ICD-10-CM | POA: Insufficient documentation

## 2022-10-29 DIAGNOSIS — M47817 Spondylosis without myelopathy or radiculopathy, lumbosacral region: Secondary | ICD-10-CM | POA: Diagnosis not present

## 2022-10-29 DIAGNOSIS — M5459 Other low back pain: Secondary | ICD-10-CM | POA: Diagnosis not present

## 2022-10-29 DIAGNOSIS — M545 Low back pain, unspecified: Secondary | ICD-10-CM | POA: Insufficient documentation

## 2022-10-29 DIAGNOSIS — Z09 Encounter for follow-up examination after completed treatment for conditions other than malignant neoplasm: Secondary | ICD-10-CM | POA: Insufficient documentation

## 2022-10-29 NOTE — Progress Notes (Signed)
PROVIDER NOTE: Information contained herein reflects review and annotations entered in association with encounter. Interpretation of such information and data should be left to medically-trained personnel. Information provided to patient can be located elsewhere in the medical record under "Patient Instructions". Document created using STT-dictation technology, any transcriptional errors that may result from process are unintentional.    Patient: Joseph Arroyo  Service Category: E/M  Provider: Oswaldo Done, MD  DOB: 09/20/1940  DOS: 10/29/2022  Referring Provider: Saralyn Pilar *  MRN: [id number]  Specialty: Interventional Pain Management  PCP: Smitty Cords, DO  Type: Established Patient  Setting: Ambulatory outpatient    Location: Office  Delivery: Face-to-face     HPI  Joseph Arroyo, a 82 y.o. year old male, is here today because of his Chronic bilateral low back pain without sciatica [M54.50, G89.29]. Joseph Arroyo's primary complain today is Back Pain (Lower and center)  Pertinent problems: Joseph Arroyo has Chronic knee pain (Left); Derangement of medial meniscus, posterior horn (Left); PAD (peripheral artery disease) (HCC); Chronic pain syndrome; Grade 1 Retrolisthesis of L2/L3 (5 mm) and L3/L4 (3 mm); Levoscoliosis of lumbar spine (L3-4 apex); DDD (degenerative disc disease), lumbosacral; Lumbosacral facet arthropathy (Left: L3-4, L4-5, and L5-S1); Tricompartment osteoarthritis of knee (Left); Baker cyst (Left); Chronic low back pain (1ry area of Pain) (Bilateral) (R>L) w/o sciatica; Lumbar facet syndrome (Bilateral); Chronic lower extremity pain (2ry area of Pain) (Bilateral) (L>R); Lumbosacral radiculitis/sensory radiculopathy at L2 (Bilateral); Lumbosacral radiculitis/sensory radiculopathy at L3 (Bilateral); Abnormal MRI, lumbar spine (04/23/2021); Lumbar central spinal stenosis, w/o neurogenic claudication (L4-5); Lumbar lateral recess stenosis  (Bilateral: L2-3, L4-5) (Right: L3-4); Lumbosacral foraminal stenosis (Bilateral: L2-3, L3-4) (Left: L5-S1); Lumbar nerve root impingement (Right: L3 at L2-3 & L3-4); Ligamentum flavum hypertrophy (L3-4, L4-5); Spondylosis without myelopathy or radiculopathy, lumbosacral region; Cervicalgia; Parkinson's disease with dyskinesia; Lumbar facet joint pain; Osteoarthritis of facet joint of lumbar spine; and Lumbosacral facet joint hypertrophy on their pertinent problem list. Pain Assessment: Severity of Chronic pain is reported as a 4 /10. Location: Back Lower/radiates to left thigh. Onset: More than a month ago. Quality: Constant, Stabbing. Timing: Constant. Modifying factor(s): meds, rest, ice and heat. Vitals:  height is 5\' 7"  (1.702 m) and weight is 119 lb (54 kg). His other (comment) temperature is 97.3 F (36.3 C) (abnormal). His blood pressure is 90/75 and his pulse is 69. His oxygen saturation is 100%.  BMI: Estimated body mass index is 18.64 kg/m as calculated from the following:   Height as of this encounter: 5\' 7"  (1.702 m).   Weight as of this encounter: 119 lb (54 kg). Last encounter: 09/15/2022. Last procedure: 10/13/2022.  Reason for encounter: post-procedure evaluation and assessment. The patient indicates having attained only a 70% relief of the pain for the duration of the local anesthetic after having had the bilateral L4-5 and L5-S1 facet joints block.  This relief then started to decline and currently he has an ongoing 40% improvement of his low back pain.  Today I have explained to the patient that there is simply no way that any of my procedures can provide him with 100% relief of the pain due to the fact that his condition is extensive and chronic.  To start with, the insurance company limits the amount of levels that we can treat.  This patient has extensive degeneration of the thoracolumbar spine with grade 1 retrolisthesis of failed to over L3 and L3 over L4 with levoscoliosis of the  lumbar spine with its  apex at the L3-4 level.  He also has documented lumbosacral facet arthropathy affecting the L3-4, L4-5, and L5-S1 levels.  In addition he has documented evidence on a lumbar MRI of having multilevel bilateral facet joint hypertrophy at the L1-2, L2-3, L3-4, L4-5, and L5-S1 levels (entire lumbar spine) with the L3-4, L4-5, and L5-S1 levels having been described by the MRI as being "SEVERE".  Based on the results of this last lumbar facet block, we can deduct that the L4-5 and L5-S1 facet joints may be responsible for 70% of the pain triggered by all of these facet joints.  Statistically, 98% of the patient's with lumbar facet arthralgia of the lumbosacral region will have involvement of the L3-4, L4-5, and L5-S1 levels.  In addition to this, the patient has Parkinson's disease with dyskinesia which further aggravates his facet arthropathy.  Today they had a couple questions regarding how often they can repeat this injections and I told them that ideally we would like to wait at least 2 months between treatments.  In other words, rather than doing injections on a monthly basis, we would need to attempt holding to do no more than 1 treatment every other month.  Even then, I have explained to them about the side effects of the long-term use of steroids.  Today I have provided them with some recommendations in terms of over-the-counter medications that they can use to improve the pain or at least assist his other medications and interventional therapies in controlling the pain.  Post-procedure evaluation   Procedure: Lumbar Facet, Medial Branch Block(s) #1  Laterality: Bilateral  Level: L3, L4, L5, and S1 Medial Branch Level(s). Injecting these levels blocks the L4-5 and L5-S1 lumbar facet joints. Imaging: Fluoroscopic guidance         Anesthesia: Local anesthesia (1-2% Lidocaine) Anxiolysis: IV Versed 2.0 mg Sedation: No Sedation                       DOS: 10/13/2022 Performed by:  Oswaldo Done, MD  Primary Purpose: Diagnostic/Therapeutic Indications: Low back pain severe enough to impact quality of life or function. 1. Chronic low back pain (1ry area of Pain) (Bilateral) (R>L) w/o sciatica   2. Lumbar facet joint pain   3. Lumbar facet syndrome (Bilateral)   4. Lumbosacral facet joint hypertrophy   5. Lumbosacral facet arthropathy (Left: L3-4, L4-5, and L5-S1)   6. Osteoarthritis of facet joint of lumbar spine   7. Spondylosis without myelopathy or radiculopathy, lumbosacral region   8. Parkinson's disease with dyskinesia, unspecified whether manifestations fluctuate    NAS-11 Pain score:   Pre-procedure: 5 /10   Post-procedure: 8 /10      Effectiveness:  Initial hour after procedure: 70 %. Subsequent 4-6 hours post-procedure: 70 %. Analgesia past initial 6 hours: 40 %. Ongoing improvement:  Analgesic: The patient indicates having attained only a 70% relief of the pain for the duration of the local anesthetic after having had the bilateral L4-5 and L5-S1 facet joints block.  This relief then started to decline and currently he has an ongoing 40% improvement of his low back pain. Function: Joseph Arroyo reports improvement in function. ROM: Somewhat improved.  Pharmacotherapy Assessment  Analgesic: No chronic opioid analgesics therapy prescribed by our practice. None MME/day: 0 mg/day   Monitoring: [place] PMP: PDMP reviewed during this encounter.       Pharmacotherapy: No side-effects or adverse reactions reported. Compliance: No problems identified. Effectiveness: Clinically acceptable.  Florina Ou,  RN  10/29/2022  2:45 PM  Sign when Signing Visit Safety precautions to be maintained throughout the outpatient stay will include: orient to surroundings, keep bed in low position, maintain call bell within reach at all times, provide assistance with transfer out of bed and ambulation.     No results found for: "CBDTHCR" No results found for:  "D8THCCBX" No results found for: "D9THCCBX"  UDS:  Summary  Date Value Ref Range Status  03/25/2021 Note  Final    Comment:    ==================================================================== Compliance Drug Analysis, Ur ==================================================================== Test                             Result       Flag       Units  Drug Present and Declared for Prescription Verification   Gabapentin                     PRESENT      EXPECTED   Baclofen                       PRESENT      EXPECTED   Paroxetine                     PRESENT      EXPECTED   Ibuprofen                      PRESENT      EXPECTED  Drug Present not Declared for Prescription Verification   Acetaminophen                  PRESENT      UNEXPECTED  Drug Absent but Declared for Prescription Verification   Alprazolam                     Not Detected UNEXPECTED ng/mg creat ==================================================================== Test                      Result    Flag   Units      Ref Range   Creatinine              122              mg/dL      >=40 ==================================================================== Declared Medications:  The flagging and interpretation on this report are based on the  following declared medications.  Unexpected results may arise from  inaccuracies in the declared medications.   **Note: The testing scope of this panel includes these medications:   Alprazolam  Baclofen (Lioresal)  Gabapentin (Neurontin)  Paroxetine (Paxil)   **Note: The testing scope of this panel does not include small to  moderate amounts of these reported medications:   Ibuprofen (Advil)   **Note: The testing scope of this panel does not include the  following reported medications:   Betamethasone (Lotrisone)  Carbidopa (Sinemet)  Clotrimazole (Lotrisone)  Folic Acid  Levodopa (Sinemet)  Lutein  Magnesium  Omega-3 Fatty Acids  Pramipexole (Mirapex)   Supplement  Ubiquinone (CoQ10)  Vitamin B ==================================================================== For clinical consultation, please call [phone]. ====================================================================       ROS  Constitutional: Denies any fever or chills Gastrointestinal: No reported hemesis, hematochezia, vomiting, or acute GI distress Musculoskeletal: Denies any acute onset joint swelling, redness, loss of ROM, or weakness Neurological: No reported episodes of  acute onset apraxia, aphasia, dysarthria, agnosia, amnesia, paralysis, loss of coordination, or loss of consciousness  Medication Review  ALPRAZolam, B Complex Vitamins, Capsicum-Garlic, Hawthorn, L-Methylfolate, Lutein, Naftifine HCl, PARoxetine, Saw Palmetto (Serenoa repens), acetaminophen, baclofen, carbidopa-levodopa, ciclopirox, gabapentin, and oxyCODONE  History Review  Allergy: Joseph Arroyo is allergic to prednisone and wheat. Drug: Joseph Arroyo  reports no history of drug use. Alcohol:  reports no history of alcohol use. Tobacco:  reports that he has never smoked. He has never used smokeless tobacco. Social: Joseph Arroyo  reports that he has never smoked. He has never used smokeless tobacco. He reports that he does not drink alcohol and does not use drugs. Medical:  has a past medical history of Allergic rhinitis due to pollen (11/21/2007), Brachial neuritis or radiculitis NOS, Cervicalgia, Concussion, Costal chondritis, Depression, Essential and other specified forms of tremor, GERD (gastroesophageal reflux disease), Lumbago, Occlusion and stenosis of carotid artery without mention of cerebral infarction, and Seizures (HCC). Surgical: Joseph Arroyo  has a past surgical history that includes Other surgical history (2002); Colonoscopy (2008); Stapedes surgery (Right, 2002); Cataract extraction (Right, 07/18/14); and Cataract extraction w/PHACO (Left, 08/01/2014). Family: family history  includes Asthma in his father; Heart failure in his mother.  Laboratory Chemistry Profile   Renal Lab Results  Component Value Date   BUN 25 10/23/2022   CREATININE 0.73 10/23/2022   BCR SEE NOTE: 10/23/2022   GFRAA 100 09/07/2019   GFRNONAA >60 03/24/2021    Hepatic Lab Results  Component Value Date   AST 17 10/02/2022   ALT 7 (L) 10/02/2022   ALBUMIN 4.1 03/24/2021   ALKPHOS 51 03/24/2021    Electrolytes Lab Results  Component Value Date   NA 140 10/23/2022   K 4.2 10/23/2022   CL 101 10/23/2022   CALCIUM 9.1 10/23/2022   MG 2.0 03/24/2021    Bone Lab Results  Component Value Date   VD25OH 42 10/10/2021   25OHVITD1 32 03/24/2021   25OHVITD2 <1.0 03/24/2021   25OHVITD3 31 03/24/2021    Inflammation (CRP: Acute Phase) (ESR: Chronic Phase) Lab Results  Component Value Date   CRP 0.5 03/24/2021   ESRSEDRATE 4 03/24/2021         Note: Above Lab results reviewed.  Recent Imaging Review  DG PAIN CLINIC C-ARM 1-60 MIN NO REPORT Fluoro was used, but no Radiologist interpretation will be provided.  Please refer to "NOTES" tab for provider progress note. Note: Reviewed        Physical Exam  General appearance: Well nourished, well developed, and well hydrated. In no apparent acute distress Mental status: Alert, oriented x 3 (person, place, & time)       Respiratory: No evidence of acute respiratory distress Eyes: PERLA Vitals: BP 90/75   Pulse 69   Temp (!) 97.3 F (36.3 C) (Other (Comment)) Comment (Src): hand per pt request  Ht 5\' 7"  (1.702 m)   Wt 119 lb (54 kg)   SpO2 100%   BMI 18.64 kg/m  BMI: Estimated body mass index is 18.64 kg/m as calculated from the following:   Height as of this encounter: 5\' 7"  (1.702 m).   Weight as of this encounter: 119 lb (54 kg). Ideal: Ideal body weight: 66.1 kg (145 lb 11.6 oz)  Assessment   Diagnosis Status  1. Chronic low back pain (1ry area of Pain) (Bilateral) (R>L) w/o sciatica   2. Lumbar facet joint pain    3. Lumbar facet syndrome (Bilateral)   4. Parkinson's disease with  dyskinesia, unspecified whether manifestations fluctuate   5. Postop check   6. Lumbosacral facet arthropathy (Left: L3-4, L4-5, and L5-S1)   7. Lumbosacral facet joint hypertrophy    Controlled Controlled Controlled   Updated Problems: No problems updated.  Plan of Care  Problem-specific:  No problem-specific Assessment & Plan notes found for this encounter.  Joseph Arroyo has a current medication list which includes the following long-term medication(s): gabapentin, paroxetine, and paroxetine.  Pharmacotherapy (Medications Ordered): No orders of the defined types were placed in this encounter.  Orders:  Orders Placed This Encounter  Procedures   Nursing Instructions:    Please complete this patient's postprocedure evaluation.    Scheduling Instructions:     Please complete this patient's postprocedure evaluation.   Follow-up plan:   No follow-ups on file.      Interventional Therapies  Risk Factors  Considerations:   Drug hypersensitivity: Oral prednisone (hyperactivity) Movement disorder (Parkinson's dyskinesia)  unsteady gait  tremor  Hx. Seizures   Carotid stenosis  orthostatic hypotension  anxiety/depression  GERD      Levoscoliosis (L3-4 apex)     Planned  Pending:      Under consideration:   Therapeutic right L1-2 LESI #3  Diagnostic bilateral L2-3 & L3-4 lumbar facet (L1-4) MBB #2  Diagnostic x-rays of the cervical spine for further evaluation of his cervicalgia.   Completed:   Diagnostic bilateral L4-5, L5-S1 lumbar facet (L3-S1) MBB x1 (10/13/2022) (70/70/40/40)  Therapeutic right (L2-3, L3-4) lumbar facet RFA x1 (06/25/2022) (50/50/30/40)  Therapeutic left (L2-3, L3-4) lumbar facet RFA x1 (08/04/2022) (50/50/30/30-40)  Diagnostic/therapeutic bilateral L2 + L3 TFESI x1 (05/07/2022) (50/50/25/20)  Diagnostic bilateral L2-3 & L3-4 Facet Blk (L1-4 MBB) x1 (12/23/2021)  (75/75/75 x2 weeks)  Diagnostic bilateral lumbar facet (L2-S1) MBB x1 (07/08/2021) (50/50/50/<50)  Therapeutic right L1-2 LESI x2 (01/27/2022) (1st:100/100/50/50) (2nd: 75/75/50/50)  Diagnostic right L2-3 LESI x1 (06/03/2021) (100/100/45/45)  Referral to physical therapy for LB PT as well as a back brace eval. (04/28/2021) (water aerobics)    Therapeutic  Palliative (PRN) options:   None w/o F2F evaluation by provider.   Pharmacotherapy  Drug hypersensitivity: Oral prednisone (hyperactivity)    Pharmacologic Recommendations  For a more complete explanation please see the dictation in the body of my 05/21/2022 note. Oxycodone 5 mg PO TID to QID PRN for pain.             Recent Visits Date Type Provider Dept  10/13/22 Procedure visit Delano Metz, MD [facility]  09/15/22 Office Visit Delano Metz, MD [facility]  08/04/22 Procedure visit Delano Metz, MD [facility]  Showing recent visits within past 90 days and meeting all other requirements Today's Visits Date Type Provider Dept  10/29/22 Office Visit Delano Metz, MD [facility]  Showing today's visits and meeting all other requirements Future Appointments No visits were found meeting these conditions. Showing future appointments within next 90 days and meeting all other requirements  I discussed the assessment and treatment plan with the patient. The patient was provided an opportunity to ask questions and all were answered. The patient agreed with the plan and demonstrated an understanding of the instructions.  Patient advised to call back or seek an in-person evaluation if the symptoms or condition worsens.  Duration of encounter: 32 minutes.  Total time on encounter, as per AMA guidelines included both the face-to-face and non-face-to-face time personally spent by the physician and/or other qualified health care professional(s) on the day of  the encounter  (includes time in activities that require the physician or other qualified health care professional and does not include time in activities normally performed by clinical staff). Physician's time may include the following activities when performed: Preparing to see the patient (e.g., pre-charting review of records, searching for previously ordered imaging, lab work, and nerve conduction tests) Review of prior analgesic pharmacotherapies. Reviewing PMP Interpreting ordered tests (e.g., lab work, imaging, nerve conduction tests) Performing post-procedure evaluations, including interpretation of diagnostic procedures Obtaining and/or reviewing separately obtained history Performing a medically appropriate examination and/or evaluation Counseling and educating the patient/family/caregiver Ordering medications, tests, or procedures Referring and communicating with other health care professionals (when not separately reported) Documenting clinical information in the electronic or other health record Independently interpreting results (not separately reported) and communicating results to the patient/ family/caregiver Care coordination (not separately reported)  Note by: Oswaldo Done, MD Date: 10/29/2022; Time: 4:54 PM

## 2022-10-29 NOTE — Progress Notes (Signed)
Safety precautions to be maintained throughout the outpatient stay will include: orient to surroundings, keep bed in low position, maintain call bell within reach at all times, provide assistance with transfer out of bed and ambulation.  

## 2022-10-29 NOTE — Patient Instructions (Signed)
______________________________________________________________________    OTC Supplements: The following over-the-counter (OTC) supplements may be of some benefits when used in moderation in some chronic pain conditions. Note: Always consult with your primary care provider and/or pharmacist before taking any OTC medications to make sure there are no "drug-to-drug" interactions with the medications you currently take. Ask your physician which may be beneficial for your particular condition.  Supplement Possible benefit May be of benefit in treatment of   Turmeric/curcumin* anti-inflammatory Joint and muscle aches and pain associated with arthritis and inflammation  Glucosamine/chondroitin (triple strength)* may slow loss of articular cartilage Osteoarthritis  Vitamin D-3* may suppress release of chemicals associated with inflammation Joint and muscle aches and pain associated with arthritis and inflammation  Moringa* anti-inflammatory with mild analgesic effects Joint and muscle aches and pain associated with arthritis and inflammation  Melatonin* Helps reset sleep cycle. Insomnia. May also be helpful in neurodegenerative disorders  Vitamin B-12* may help keep nerves and blood cells healthy as well as maintaining function of nervous system Neuropathies. Nerve pain (Burning pain)  Alpha-Lipoic-Acid (ALA)* antioxidant that may help with nerve health, pain, and blocking the activation of some inflammatory chemicals Diabetic neuropathy and metabolic syndrome       (*Always use manufacturer's recommended dosage.)  ______________________________________________________________________

## 2022-11-03 ENCOUNTER — Ambulatory Visit (INDEPENDENT_AMBULATORY_CARE_PROVIDER_SITE_OTHER): Payer: Medicare HMO | Admitting: Psychiatry

## 2022-11-03 ENCOUNTER — Encounter: Payer: Self-pay | Admitting: Psychiatry

## 2022-11-03 ENCOUNTER — Ambulatory Visit: Payer: Medicare HMO | Attending: Physician Assistant

## 2022-11-03 DIAGNOSIS — R2681 Unsteadiness on feet: Secondary | ICD-10-CM | POA: Diagnosis not present

## 2022-11-03 DIAGNOSIS — R262 Difficulty in walking, not elsewhere classified: Secondary | ICD-10-CM | POA: Diagnosis not present

## 2022-11-03 DIAGNOSIS — R42 Dizziness and giddiness: Secondary | ICD-10-CM | POA: Insufficient documentation

## 2022-11-03 DIAGNOSIS — R278 Other lack of coordination: Secondary | ICD-10-CM | POA: Insufficient documentation

## 2022-11-03 DIAGNOSIS — F411 Generalized anxiety disorder: Secondary | ICD-10-CM | POA: Diagnosis not present

## 2022-11-03 DIAGNOSIS — G894 Chronic pain syndrome: Secondary | ICD-10-CM | POA: Diagnosis not present

## 2022-11-03 DIAGNOSIS — M6281 Muscle weakness (generalized): Secondary | ICD-10-CM | POA: Insufficient documentation

## 2022-11-03 DIAGNOSIS — F331 Major depressive disorder, recurrent, moderate: Secondary | ICD-10-CM

## 2022-11-03 NOTE — Therapy (Signed)
OUTPATIENT PHYSICAL THERAPY VESTIBULAR TREATMENT NOTE     Patient Name: Joseph Arroyo MRN: [id number] DOB:Jul 07, 1940, 82 y.o., male Today's Date: 11/03/2022  END OF SESSION:  PT End of Session - 11/03/22 1525     Visit Number 4    Number of Visits 25    Date for PT Re-Evaluation 01/11/23    PT Start Time 1317    PT Stop Time 1401    PT Time Calculation (min) 44 min    Equipment Utilized During Treatment Gait belt    Activity Tolerance Patient tolerated treatment well    Behavior During Therapy WFL for tasks assessed/performed               Past Medical History:  Diagnosis Date   Allergic rhinitis due to pollen 11/21/2007   Brachial neuritis or radiculitis NOS    Cervicalgia    Concussion    age 9 - s/p accident   Costal chondritis    Depression    Essential and other specified forms of tremor    GERD (gastroesophageal reflux disease)    in past   Lumbago    Occlusion and stenosis of carotid artery without mention of cerebral infarction    Seizures [facility])    age 46 - after concussion   Past Surgical History:  Procedure Laterality Date   CATARACT EXTRACTION Right 07/18/14   CATARACT EXTRACTION W/PHACO Left 08/01/2014   Procedure: CATARACT EXTRACTION PHACO AND INTRAOCULAR LENS PLACEMENT (IOC);  Surgeon: Lockie Mola, MD;  Location: [facility];  Service: Ophthalmology;  Laterality: Left;  IVA TOPICAL   COLONOSCOPY  2008   OTHER SURGICAL HISTORY  2002   ear surgery   STAPEDES SURGERY Right 2002   Patient Active Problem List   Diagnosis Date Noted   Dizziness 10/02/2022   Lumbar facet joint pain 06/25/2022   Osteoarthritis of facet joint of lumbar spine 06/25/2022   Lumbosacral facet joint hypertrophy 06/25/2022   Parkinson's disease with dyskinesia 01/27/2022   Dysphagia, pharyngeal phase 09/17/2021   Cervicalgia 08/05/2021   Spondylosis without myelopathy or radiculopathy, lumbosacral region 06/24/2021   Lumbar central spinal stenosis,  w/o neurogenic claudication (L4-5) 06/03/2021   Lumbar lateral recess stenosis (Bilateral: L2-3, L4-5) (Right: L3-4) 06/03/2021   Lumbosacral foraminal stenosis (Bilateral: L2-3, L3-4) (Left: L5-S1) 06/03/2021   Lumbar nerve root impingement (Right: L3 at L2-3 & L3-4) 06/03/2021   Ligamentum flavum hypertrophy (L3-4, L4-5) 06/03/2021   Abnormal MRI, lumbar spine (04/23/2021) 04/24/2021   Long term prescription benzodiazepine use (alprazolam) (Xanax) 03/24/2021   Grade 1 Retrolisthesis of L2/L3 (5 mm) and L3/L4 (3 mm) 03/24/2021   Levoscoliosis of lumbar spine (L3-4 apex) 03/24/2021   DDD (degenerative disc disease), lumbosacral 03/24/2021   Lumbosacral facet arthropathy (Left: L3-4, L4-5, and L5-S1) 03/24/2021   Tricompartment osteoarthritis of knee (Left) 03/24/2021   Baker cyst (Left) 03/24/2021   Chronic low back pain (1ry area of Pain) (Bilateral) (R>L) w/o sciatica 03/24/2021   Lumbar facet syndrome (Bilateral) 03/24/2021   Chronic lower extremity pain (2ry area of Pain) (Bilateral) (L>R) 03/24/2021   Lumbosacral radiculitis/sensory radiculopathy at L2 (Bilateral) 03/24/2021   Lumbosacral radiculitis/sensory radiculopathy at L3 (Bilateral) 03/24/2021   Chronic pain syndrome 03/23/2021   Pharmacologic therapy 03/23/2021   Disorder of skeletal system 03/23/2021   Problems influencing health status 03/23/2021   PAD (peripheral artery disease) (HCC) 10/01/2020   GAD (generalized anxiety disorder) 01/13/2018   MDD (major depressive disorder) 01/13/2018   Umbilical hernia 09/04/2017   Chronic knee pain (  Left) 08/11/2017   Derangement of medial meniscus, posterior horn (Left) 08/11/2017   Vitamin D insufficiency 03/05/2016   BPH without obstruction/lower urinary tract symptoms 07/08/2015   Chronic fatigue 10/23/2014   Major depression, recurrent, full remission (HCC) 07/26/2013   Unsteady gait 07/26/2013   Orthostatic hypotension 07/26/2013   Depression 07/26/2013   Anxiety  06/23/2013   Chronic anxiety 06/23/2013   Tremor 05/18/2013   Bilateral carotid artery stenosis 08/30/2012    PCP: Smitty Cords, DO  REFERRING PROVIDER:   Mecum, Oswaldo Conroy, PA-C    REFERRING DIAG: R42 (ICD-10-CM) - Dizziness   THERAPY DIAG:  Dizziness and giddiness  Difficulty in walking, not elsewhere classified  ONSET DATE: August 1st 2024  Rationale for Evaluation and Treatment: Rehabilitation  SUBJECTIVE:   SUBJECTIVE STATEMENT Pt reports still having spinning with lying back. Pt still seeing double when he looks out the L eye. Reports overall symptoms are better since first treatment, but not resolved.     Pt accompanied by: significant other and daughter  PERTINENT HISTORY:   The pt is a pleasant 82 yo male referred to PT for dizziness. Pt known to clinic, previously seen for falls in the setting of PD.  Pt spouse present for eval and reports pt was seen by neurologist recently at [facility] following onset of dizziness about two-three weeks ago. He reports they think he has BPPV.  PT spouse reports he had blood work done at this visit which "didn't show anything" and was normal. Pt unsure if his symptoms come on when he is still at rest. He describes his symptoms as spinning. This can be triggered when he lies on his back and also when he sits up from supine. Spinning lasts for minutes. He reports no nausea.  Pt sometimes sees double when he looks to the side and this has been going on for weeks. Denies any one sided weakness or other changes. He has fallen 4 times in past 3 months. Pt spouse reports pt had a fall about a week prior to dizziness onset. Pt reports no neck pain, has hx of LBP. His spouse reports decline in his mobility since this started. Pt uses RW at baseline for ambulation. Pt PMH significant for Parkinson's disease, concussion, cervicalgia, depression, seizures, chronic L knee pain, chronic anxiety, DDD, PAD, chronic pain syndrome, chronic low back  pain, please refer to chart for full, extensive, PMH. PAIN:  Are you having pain? No  PRECAUTIONS: Fall    WEIGHT BEARING RESTRICTIONS: No  FALLS: Has patient fallen in last 6 months? Yes. Number of falls 4  LIVING ENVIRONMENT: via chart and confirmed by pt in session Lives with: lives with their spouse Lives in: House/apartment Stairs: 4 Has following equipment at home: Environmental consultant - 2 wheeled  PLOF:  needs assistance, pt with PD that affected his mobility/ADLs  PATIENT GOALS: get rid of dizziness  OBJECTIVE: taken at eval unless specified otherwise  DIAGNOSTIC FINDINGS:  No recent pertinent imaging per chart  COGNITION: Overall cognitive status: Within functional limits for tasks assessed   SENSATION: Not tested  POSTURE:  Crouched posture, significant thoracic kyphosis, forward head posture  Cervical ROM:   AROM screen - pt exhibits cervical rotation bilat and flexion/ext that is pain-free that is sufficient for requirements of positional testing and vestibular testing    BED MOBILITY:  Impaired due to mobility and dizziness with lying down/rolling  TRANSFERS: Assistive device utilized: Walker - 2 wheeled  Sit to stand: Mod A Stand to  sit: Min A   GAIT: Gait pattern:  crouched/forward posture, decreased gait speed observed, decreased step-length bilat, movement rigid, pt requires use of RW and close CGA Distance walked: 20 ft in clinic Assistive device utilized: Walker - 2 wheeled Level of assistance:  close CGA Comments: pt reports no dizziness with gait, no LOB during gait  FUNCTIONAL TESTS:  Timed up and go (TUG): deferred to future visit - deferred to future visit   PATIENT SURVEYS:  FOTO 44  VESTIBULAR ASSESSMENT:  SYMPTOM BEHAVIOR:  Subjective history: pt with positionally triggered vertigo per report  Non-Vestibular symptoms: diplopia  Type of dizziness: Spinning/Vertigo and "World moves", unsteady  Frequency: with seated>supine and  supine>seated  Duration: pt reports minutes   OCULOMOTOR EXAM: room light  Ocular Alignment: normal  Ocular ROM: No Limitations  Spontaneous Nystagmus: absent  Gaze-Induced Nystagmus: absent  Smooth Pursuits: saccades   Saccades: dysmetria   Convergence/Divergence: not tested    VESTIBULAR - OCULAR REFLEX:   Slow VOR: slightly saccadic  VOR Cancellation: Comment: deferred HIT: WNL bilaterally      POSITIONAL TESTING: Right Dix-Hallpike: completed with vestibular goggles donned/fixation suppressed - first DH pt has jerk nystagmus, R torsional up beating <60 sec duration.  Second reassessment in [facility] pt exhibits possible ageotropic nystagmus, unclear if multi-canal involvement or canal conversion. Nystagmus duration >60 sec, but does appear to fatigue    Other tests:  Cross-cover test: no vertical skew   VESTIBULAR TREATMENT:                                                                                                   DATE: 11/03/22   Gait belt donned and PT providing CGA-mod a throughout to ensure pt safety   CRM: Goggles donned and visual fixation suppressed  Right Dix-Hallpike: Positive, R torsional and up-beating jerk nystagmus <15 sec duration prior to fatiguing, pt reports severe dizziness that does not last over a minute.  Right Epley 1x Right roll test- possible right horizontal beats (>4 beats), dizziness reported  Left Roll test: possible R beats, no dizziness R BBQ treatment 1x    TA: Instructed pt in trying Seated>supine>seated 5x at home in his bed this family member present for safety/guarding to initiate habituation. PT did caution pt and his spouse for pt to discontinue at home if pt with dizziness >20 min, if not able to complete safely or if pt experiences significant increase in his back pain. Plan to review again next visit.  PT instructs pt and his spouse to discuss chronic double vision with his neurologist. They verbalize understanding,  agreeable to plan.  Pt able to successfully ambulate with RW following treatment, no dizziness, no decreased balance.    PATIENT EDUCATION: Education details: Pt educated throughout session about proper posture and technique with exercises. Improved exercise technique, movement at target joints, use of target muscles after min to mod verbal, visual, tactile cues. Post-maneuver precautions.  Person educated: Patient and Spouse Education method: Explanation, Demonstration, Tactile cues, and Verbal cues Education comprehension: verbalized understanding and returned demonstration  HOME EXERCISE PROGRAM:  GOALS:   Goals reviewed with patient? Yes regarding goals of maneuvers - majority of goal testing to be initiated next visit, deferred today due ot time  SHORT TERM GOALS: Target date: 12/15/2022   Patient will be independent in home exercise program to improve dizziness, balance and mobility for better safety and functional independence with ADLs. Baseline: to be initiated future visit/as needed Goal status: INITIAL   LONG TERM GOALS: Target date: 01/26/2023    Patient will increase FOTO score to equal to or greater than 53    to demonstrate statistically significant improvement in mobility and quality of life.  Baseline: 44 Goal status: INITIAL  2.  Patient will reduce dizziness handicap inventory score to <50, for less dizziness with ADLs and increased safety with home and work tasks.  Baseline: deferred, will complete as pt able Goal status: INITIAL  3.  The pt will report at least a 60% decrease in positional or movement triggered dizziness symptoms in order to indicate decreased fall risk and increased balance/safety with mobility.  Baseline: pt presents with decreased mobility due to dizziness, dizziness/vertigo triggered by particular positional; changes Goal status: INITIAL  4.  Patient will reduce timed up and go to <11 seconds to reduce fall risk and demonstrate  improved transfer/gait ability.  Baseline: Pt to complete as appropriate  Goal status: INITIAL   ASSESSMENT:  CLINICAL IMPRESSION: Pt with positive R Dix-Hallpike with vestibular goggles donned, nystagmus <15 sec duration (up-beat,R torsional), and still with feelings of severe dizziness. However, pt's dizziness sensation did not last as long this visit. Pt still dizzy with R Roll test, with geotropic beats. As pt did not exhibit geotropic beats on the L side, suspicion is low for horizontal canal involvement, however PT did provide BBQ treatment as pt consistently symptomatic in this position. Due to persistent nature of nystagmus, PT instructed pt in initiating habituation intervention (see above). Will continue to reassess and initiate other interventions as indicated. The pt will benefit from further skilled PT to address these deficits in order to decrease fall risk, dizziness and improve balance/QOL.     OBJECTIVE IMPAIRMENTS: Abnormal gait, decreased activity tolerance, decreased balance, decreased coordination, decreased mobility, difficulty walking, decreased strength, dizziness, impaired vision/preception, improper body mechanics, and postural dysfunction.   ACTIVITY LIMITATIONS: lifting, bending, squatting, stairs, transfers, bed mobility, toileting, dressing, hygiene/grooming, and locomotion level  PARTICIPATION LIMITATIONS: meal prep, cleaning, laundry, shopping, community activity, and yard work  PERSONAL FACTORS: Age, Fitness, and 3+ comorbidities: Pt PMH significant for Parkinson's disease, concussion, cervicalgia, depression, seizures, chronic L knee pain, chronic anxiety, DDD, PAD, chronic pain syndrome, chronic low back pain, please refer to chart for full, extensive, PMH.  are also affecting patient's functional outcome.   REHAB POTENTIAL: Good  CLINICAL DECISION MAKING: Evolving/moderate complexity  EVALUATION COMPLEXITY: Moderate   PLAN:  PT FREQUENCY:  1-2x/week  PT DURATION: 12 weeks  PLANNED INTERVENTIONS: Therapeutic exercises, Therapeutic activity, Neuromuscular re-education, Balance training, Gait training, Patient/Family education, Self Care, Joint mobilization, Stair training, Vestibular training, Canalith repositioning, Visual/preceptual remediation/compensation, DME instructions, Electrical stimulation, Wheelchair mobility training, Spinal mobilization, Cryotherapy, Moist heat, Splintting, Taping, Traction, Manual therapy, and Re-evaluation  PLAN FOR NEXT SESSION: further testing as pt able, possibly semont if assist available, assess VORx1 intervention   Baird Kay, PT 11/03/2022, 3:27 PM

## 2022-11-03 NOTE — Progress Notes (Signed)
Joseph Arroyo [id number] 1940/10/10 82 y.o.  Virtual Visit via Telephone Note  I connected with pt by telephone and verified that I am speaking with the correct person using two identifiers.   I discussed the limitations, risks, security and privacy concerns of performing an evaluation and management service by telephone and the availability of in person appointments. I also discussed with the patient that there may be a patient responsible charge related to this service. The patient expressed understanding and agreed to proceed.  I discussed the assessment and treatment plan with the patient. The patient was provided an opportunity to ask questions and all were answered. The patient agreed with the plan and demonstrated an understanding of the instructions.   The patient was advised to call back or seek an in-person evaluation if the symptoms worsen or if the condition fails to improve as anticipated.  I provided 30 minutes of non-face-to-face time during this encounter. The patient was located at home and the provider was located office. Session 1130 am-1200 Noon  Subjective:   Patient ID:  Joseph Arroyo is a 82 y.o. (DOB Jun 04, 1940) male.  Chief Complaint:  Chief Complaint  Patient presents with   Follow-up   Depression   Anxiety    Depression        Associated symptoms include fatigue.  Past medical history includes anxiety.   Anxiety Symptoms include nervous/anxious behavior. Patient reports no dizziness or palpitations.     Olen Pel Deshmukh ( lor-on) presents to the office today for follow-up of major depression and generalized anxiety disorder.    He was seen December, 2020 & June 2021.  No meds were changed.  Continued paroxetine 60 mg daily plus gabapentin 100 mg twice daily and Mirapex 0.125 mg 3 times daily.   08/2019 appt without med changes noted: Tolerates the meds well.  Wife thinks he's doing very well and he agrees.   No complaints.  2 D's.    Still exercising and has fatigue but can function and enjoys activity. Restarted men's Bible study.  He feels positive and hopeful and useful.  At some point would like to be free of medication.  Does not feel guilty about the meds.    03/19/2020 appt noted:  Seen with wife Frederick Peers August and took Ivermectin and both had it mild. As far mood concerned he's doing well.  Wife agrees.  Not markedly depressed or anxious.  Feels the suffering of others.  [name] in hospital in [place].  Friend with Covid also. No episodes. Tolerating meds.  No Xanax used.  Attend [employer] in [place]. Usually sleep well but back pain can interfere with sleep. TX for PD and a bit slower per wife. Plan: Try increasing gabapentin to 100 mg AM & 200 mg PM for back pain.   Continue paroxetine 60 mg daily and Deplin 15 mg daily  10/03/2020 appointment with the following noted: Seen with wife, [name] Off and on increased gabapentin at PM to 200 mg for back pain and sleeps better.   Not depressed.  Anxiety is manageable.   Seeing chiropracter who he likes Dr. Steve Rattler in [place]. No SE noted with meds.  Perspire heavily. No panic attacks.   Sleep variable and is usually OK. Still leads men's Bible Study. Uses [employer] Plan: No med change  03/25/2021 appointment with the following noted: with wife Cristie Hem but came on quickly. Shaking with anxiety and been going on for 3-4 weeks. Rare use of  Xanax. A lot of physical px with constant pain with back. Pending workup.  Fell 3 weeks ago. T pain clinic now.  Everything seemed to pile up. Insomnia with mind racing with worry.  He can't tolerate negativity.   Rare panic. Consistent with paroxetine 60 mg daily. Consistent with gabapentin 100 AM and 200 PM Plan: For persistent symptoms of anxiety we will start off label trial of clonidine.  Cautioned about side effects.  05/13/2021 appointment with the following noted: wife [name] on call Taking  clonidine 0.1 mg tablet 1/2 tablets twice daily. Can't tell if there has been benefit from this.   Still taking Xanax but usually about one daily. Couldn't get here DT pain.  Been to pain clinic but no meds yet.  Problems with insurance company giving approval for the tests.  At times pain is excruciating.   BP has been OK.   Being cautious about getting up and protecting from dizziness. Tremors will wax and wane and worse with stress.   Fighting over the idea God does not want him to be like this and feels there is something wrong with him.   No SI  "that's not an option".   Plan: Continue paroxetine 60 mg daily DT PD hesitate to use atypicals for TR anxiety. Consider increasing gabapentin for anxiety off label.  Yes increase to 200 mg BID Ok Xanax  Clonidine 0.1 mg tablets 1/2 tablet twice daily    07/03/2021 appointment with the following noted: seen with [name] on call Using Xanax prn 1-2 daily. Increased gabapentin 100 mg AM and noon and 200 mg PM; hard to tell if it helped any but it might have.  No SE noted. Feels fine today and thinks there's improvement.  Believe the Shaune Pollack is bringing him through this. Epidural recently.  Some pain relief. Per wife has had some anxious moments.   Tremors vary.   Some PT, balance a little worse since Xmas.   No low BP. When sleep is fine but 2-3 hours at a time and nocturia. He thinks depression is a lot better, today I feel good.  Needs to get out socially.  He's a people person.  She thinks today is best day since Jan.   Plan: Consider increasing gabapentin for anxiety off label.  Yes increase to 200 mg BID  09/09/2021 appointment with following noted: with wife [name] Doing ok thank you. A lot better with depression and anxiety. Gabapentin 100 mg AM and noon and 200 HS. Gets sleepy in Am after breakfast with Sinemet.  Takes a nap. Sleep pretty well. Disc scamming but they have been careful. Wife notices anxiety and tremors reenforce each other.   Only one Xanax daily.  Wants to go out daily. Not attending main church but can go to house church and is getting out more. No SE except sleepiness. 3 back pain procedures with some benefit. Started speech therapy and OT and he likes to be active. Plan: no med changes Continur paroxetine 60 mg daily gabapentin 100 mg capsules 1 every morning and noon and 200 mg nightly,  01/12/22 appt noted:  (Loran) Continues alprazolam 0.25 mg 3 times daily as needed anxiety (taking 1 daily), clonidine 0.1 mg tablets one half twice daily, gabapentin 100 mg every morning and noon and 200 mg nightly, L-methylfolate, paroxetine 20 mg every morning and 40 mg nightly, pramipexole 0.125 mg nightly. Increased carbi-levo to 2 and 1/2 tab TID.   Vivid dreams.  Last night a disturbing one.  A couple of times in past remotely acted out the dream.   Feeling tired with the meds.  Pain meds also make him tired and PD too.  Continues to believe God will heal him completely.  Getting facet blocks at the pain doc.  Continues shots. Depression is minimal but more anxiety than depression.  Will sometimes Xanax for tremor.  Not alsways used daily.  Finished speech therapy but still doing PT.  Is better when he goes out somewhere.  Family coming soon,  Sleep is otherwise OK.   Tendency to constipation or diarrhea.   Asks about natural meds to help his medical problems if possible.  He'd like some changes .   Church built him a ramp for his house. Plan: No med changes   05/13/2022 appointment noted:  with [name], reviewed med list with her Not taking pramipexole hs but is taking gabapentin. More difficulty speaking than he has been in the past.   Holding my own.  Struggling quite a bit.  More anxiety provoked by tremor or bad dreams.  Latter caused bad dreams.  Still some.  Not acting out dreams like before.  Bad dreams about 2 times per week.   Usually sleep 2 hours at time with nocturia.   PD sx are worse per wife.  Harder to  walk and do things.   No VH with meds for PD. Anxiety remains pretty high and needs the Xanax.   ? Tiredness with meds.   Plan: Trial wean  and if anxiety is worse then resume Clonidine 0.1 mg tablets 1/2 tablet twice daily    06/30/22 appt noted: with [name] Radio procedure for pain recently and too early to tell how it will work.  Never had it before.   Taking alprazolam prn Taking gabapentin 100 mg prn.  Clonidine 0.05 mg HS. Paroxetine 60 mg daily. BP is better with less.   Anxiety worse middday when tremors are worse. Sleep 2 h then nocturia and then 1/2 hour to get back to sleep.  May take 1/2 hour to get back to bed DT PD slowness. Unsure how much sleep he's getting.  Not sure if reducing clonidine increased the anxiety or not. No panic.  No crying spells.  Can't cry.    11/03/22 appt noted: with [name] Off clonidine.  He didn't notice any difference off of it. Had several procedures and trying to limit meds.  Still having pain issues.  Steroid injx have steroid which increases anxiety.  Not as high now but still there.  Xanax 0.25 mg prn (about 1 daily). Gabapentin makes him drowsy in the AM Sleep 2 h then nocturia and then 1/2 hour to get back to sleep.  May take 1/2 hour to get back to bed DT PD slowness. Unsure how much sleep he's getting.  Doses a good bit in the day too. Steroids tend to affect mood also.   No other problems with meds.   Makes a point of getting out of the house.  Walks at grocery store and doing some PT.  [facility] [place].   Was 12 years free of medication until this last relapse and yet it took a long time to get relief again.  Wife, [name], said overall he's been doing well.  No prn BZ in a couple of years.  Past Psychiatric Medication Trials: Under our care since 2013. Paroxetine 60, duloxetine, fluoxetine, Luvox, venlafaxine, Trintellix, ,  mirtazapine, doxepin,  Gabapentin 200 BID Deplin,  clonazepam, lorazepam, alprazolam  buspirone,   Clonidine 0.05  BID for anxiety lamotrigine,   Abilify, Seroquel, Zyprexa,  risperidone, Saphris,  lithium with some benefit,  Pramipexole   Review of Systems:  Review of Systems  Constitutional:  Positive for fatigue.  HENT:  Positive for voice change.   Cardiovascular:  Negative for palpitations.  Gastrointestinal:  Positive for diarrhea.  Musculoskeletal:  Positive for back pain and gait problem.  Neurological:  Positive for tremors and weakness. Negative for dizziness.  Psychiatric/Behavioral:  Positive for depression. Negative for dysphoric mood. The patient is nervous/anxious.     Medications: I have reviewed the patient's current medications.  Current Outpatient Medications  Medication Sig Dispense Refill   acetaminophen (TYLENOL) 500 MG tablet Take 1,000 mg by mouth every 8 (eight) hours as needed.     ALPRAZolam (XANAX) 0.25 MG tablet Take 1 tablet (0.25 mg total) by mouth 3 (three) times daily as needed for anxiety. 90 tablet 1   B Complex Vitamins (VITAMIN B COMPLEX PO) Take 1 tablet by mouth daily.     baclofen (LIORESAL) 10 MG tablet Take 0.5-1 tablets (5-10 mg total) by mouth 3 (three) times daily as needed for muscle spasms. 90 each 1   Capsicum-Garlic 200-300 MG CAPS Take 528-413 mg by mouth.     carbidopa-levodopa (SINEMET IR) 25-100 MG tablet Take 2.5 tablets by mouth 3 (three) times daily.     ciclopirox (PENLAC) 8 % solution Apply topically at bedtime.     gabapentin (NEURONTIN) 100 MG capsule 1 IN AM AND NOON AND 2 AT NIGHT 360 capsule 1   HAWTHORNE BERRY PO Take by mouth daily.     L-Methylfolate 15 MG TABS Take 1 tablet (15 mg total) by mouth daily. 90 tablet 3   Lutein 10 MG TABS Take 1 tablet by mouth 3 (three) times daily. 1 daily     Naftifine HCl 2 % GEL Apply 1 Application topically daily.     oxyCODONE (OXY IR/ROXICODONE) 5 MG immediate release tablet Take 1 tablet (5 mg total) by mouth 3 (three) times daily as needed for severe pain or moderate pain. 45 tablet 0    PARoxetine (PAXIL) 20 MG tablet TAKE 1 TABLET BY MOUTH EVERY DAY (WITH THE 40 MG TABLET FOR 60 MG TOTAL) 90 tablet 1   PARoxetine (PAXIL) 40 MG tablet TAKE 1 TABLET BY MOUTH EVERY DAY (WITH THE 20 MG TABLET FOR 60 MG TOTAL) 90 tablet 1   Saw Palmetto, Serenoa repens, 1000 MG CAPS Take 2 capsules by mouth daily.     No current facility-administered medications for this visit.    Medication Side Effects: Fatigue and Other: from Mirapex  esp in AM  Allergies:  Allergies  Allergen Reactions   Prednisone     Hyperactivity    Wheat Diarrhea    Past Medical History:  Diagnosis Date   Allergic rhinitis due to pollen 11/21/2007   Brachial neuritis or radiculitis NOS    Cervicalgia    Concussion    age 40 - s/p accident   Costal chondritis    Depression    Essential and other specified forms of tremor    GERD (gastroesophageal reflux disease)    in past   Lumbago    Occlusion and stenosis of carotid artery without mention of cerebral infarction    Seizures (HCC)    age 15 - after concussion    Family History  Problem Relation Age of Onset   Heart failure Mother  enlarged heart    Asthma Father     Social History   Socioeconomic History   Marital status: Married    Spouse name: [name] Searls   Number of children: 2   Years of education: 12   Highest education level: Master's degree (e.g., MA, MS, MEng, MEd, MSW, MBA)  Occupational History   Occupation: Retired Film/video editor (English)    Employer: RETIRED  Tobacco Use   Smoking status: Never   Smokeless tobacco: Never  Vaping Use   Vaping status: Never Used  Substance and Sexual Activity   Alcohol use: No    Alcohol/week: 0.0 standard drinks of alcohol   Drug use: No   Sexual activity: Not Currently  Other Topics Concern   Not on file  Social History Narrative   Married to [place], has 2 children, has grandchildren   Right handed   Master's plus   He is born in [place], heritage is [place] (Father's  side), [place] (Mother's side)      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    Social Determinants of Health   Financial Resource Strain: Low Risk  (06/03/2022)   Overall Financial Resource Strain (CARDIA)    Difficulty of Paying Living Expenses: Not hard at all  Food Insecurity: No Food Insecurity (06/08/2022)   Hunger Vital Sign    Worried About Running Out of Food in the Last Year: Never true    Ran Out of Food in the Last Year: Never true  Transportation Needs: No Transportation Needs (06/08/2022)   PRAPARE - Administrator, Civil Service (Medical): No    Lack of Transportation (Non-Medical): No  Physical Activity: Insufficiently Active (06/03/2022)   Exercise Vital Sign    Days of Exercise per Week: 3 days    Minutes of Exercise per Session: 30 min  Stress: No Stress Concern Present (06/03/2022)   [employer] of Occupational Health - Occupational Stress Questionnaire    Feeling of Stress : Only a little  Social Connections: Socially Integrated (06/03/2022)   Social Connection and Isolation Panel [NHANES]    Frequency of Communication with Friends and Family: More than three times a week    Frequency of Social Gatherings with Friends and Family: Twice a week    Attends Religious Services: More than 4 times per year    Active Member of [employer] or Organizations: Yes    Attends Banker Meetings: 1 to 4 times per year    Marital Status: Married  Catering manager Violence: Not At Risk (06/03/2022)   Humiliation, Afraid, Rape, and Kick questionnaire    Fear of Current or Ex-Partner: No    Emotionally Abused: No    Physically Abused: No    Sexually Abused: No    Past Medical History, Surgical history, Social history, and Family history were reviewed and updated as appropriate.   Please see review of systems for further details on the patient's review from today.   Objective:   Physical Exam:  There were no vitals taken for this visit.  Physical Exam Neurological:      Mental Status: He is alert and oriented to person, place, and time.     Cranial Nerves: No dysarthria.  Psychiatric:        Attention and Perception: Attention and perception normal.        Mood and Affect: Mood is anxious and depressed.        Behavior: Behavior is cooperative.        Thought Content:  Thought content normal. Thought content is not paranoid or delusional. Thought content does not include homicidal or suicidal ideation. Thought content does not include suicidal plan.        Cognition and Memory: Cognition and memory normal.        Judgment: Judgment normal.     Comments: Insight intact Voice change with PD Still residual sx      Lab Review:     Component Value Date/Time   NA 140 10/23/2022 0900   NA 143 07/10/2015 0821   K 4.2 10/23/2022 0900   CL 101 10/23/2022 0900   CO2 32 10/23/2022 0900   GLUCOSE 121 10/23/2022 0900   BUN 25 10/23/2022 0900   BUN 13 07/10/2015 0821   CREATININE 0.73 10/23/2022 0900   CALCIUM 9.1 10/23/2022 0900   PROT 6.4 10/02/2022 1433   PROT 6.8 07/10/2015 0821   ALBUMIN 4.1 03/24/2021 1351   ALBUMIN 4.2 07/10/2015 0821   AST 17 10/02/2022 1433   ALT 7 (L) 10/02/2022 1433   ALKPHOS 51 03/24/2021 1351   BILITOT 0.5 10/02/2022 1433   BILITOT 0.5 07/10/2015 0821   GFRNONAA >60 03/24/2021 1351   GFRNONAA 86 09/07/2019 0941   GFRAA 100 09/07/2019 0941       Component Value Date/Time   WBC 6.1 10/02/2022 1433   RBC 4.42 10/02/2022 1433   HGB 14.2 10/02/2022 1433   HGB 15.3 10/23/2014 1237   HCT 41.3 10/02/2022 1433   HCT 45.1 10/23/2014 1237   PLT 207 10/02/2022 1433   PLT 150 10/23/2014 1237   MCV 93.4 10/02/2022 1433   MCV 88 10/23/2014 1237   MCH 32.1 10/02/2022 1433   MCHC 34.4 10/02/2022 1433   RDW 11.7 10/02/2022 1433   RDW 13.3 10/23/2014 1237   LYMPHSABS 1,305 10/02/2022 1433   LYMPHSABS 1.7 10/23/2014 1237   MONOABS 357 07/24/2016 1050   EOSABS 201 10/02/2022 1433   EOSABS 0.3 10/23/2014 1237   BASOSABS 61  10/02/2022 1433   BASOSABS 0.1 10/23/2014 1237    No results found for: "POCLITH", "LITHIUM"   No results found for: "PHENYTOIN", "PHENOBARB", "VALPROATE", "CBMZ"   .res Assessment: Plan:    Gorden was seen today for follow-up, depression and anxiety.  Diagnoses and all orders for this visit:  Major depressive disorder, recurrent episode, moderate (HCC)  GAD (generalized anxiety disorder)  Chronic pain syndrome   History of extremely severe TR depression and anxiety with multiple med failures.  Doing relatively well for 2-3 years.    But residual dep and anxiety sx contributed to by pain and advancing Parkinson's disease.  He has been consistent with the paroxetine  Still works out at gym.  Disc pricing L-methylfolate with [facility]. Continue paroxetine 60 mg daily Hesitate to increase paroxetine right now   Parkiinson's is better with meds. But not great. DT PD hesitate to use atypicals for TR anxiety.  Consider nortriptyline but this is more risky DT age.    Continue gabapentin for anxiety off label and pain 200 mg BID. Option reduce for energy  Ok Xanax 0.25 mg TID prn.  He's not using much, usually only 1 daily.   We discussed the short-term risks associated with benzodiazepines including sedation and increased fall risk among others.  Discussed long-term side effect risk including dependence, potential withdrawal symptoms, and the potential eventual dose-related risk of dementia.  But recent studies from 2020 dispute this association between benzodiazepines and dementia risk. Newer studies in 2020 do not support an association  with dementia.  No major px off the clonidine.  It is ok to resume if needed for anxiety  Tolerating and benefitting.'  Answered questions about Vitamin D.  He's taking 4000 units  No other med changes  FU 3-4 mos  Meredith Staggers, MD, DFAPA   Please see After Visit Summary for patient specific instructions.  Future Appointments  Date Time  Provider Department Center  11/05/2022  8:45 AM Temple Pacini R, PT [facility] None  11/12/2022  2:00 PM Baird Kay, PT [facility] None  11/19/2022  2:00 PM Baird Kay, PT [facility] None  11/20/2022 11:45 AM Marlowe Sax, RN [facility] None  11/23/2022  8:00 AM Temple Pacini R, PT [facility] None  11/26/2022  2:00 PM Temple Pacini R, PT [facility] None  12/01/2022 11:45 AM Temple Pacini R, PT [facility] None  12/08/2022 11:00 AM Temple Pacini R, PT [facility] None  12/10/2022  2:00 PM Baird Kay, PT [facility] None  12/14/2022  8:00 AM Temple Pacini R, PT [facility] None  12/17/2022  2:00 PM Temple Pacini R, PT [facility] None  12/22/2022  2:00 PM Temple Pacini R, PT [facility] None  12/24/2022  1:15 PM Temple Pacini R, PT [facility] None  12/29/2022  1:15 PM Temple Pacini R, PT [facility] None  12/31/2022  3:30 PM Temple Pacini R, PT [facility] None  01/05/2023  1:15 PM Temple Pacini R, PT [facility] None  01/07/2023  1:15 PM Temple Pacini R, PT [facility] None  01/12/2023  2:00 PM Temple Pacini R, PT [facility] None  01/14/2023  1:15 PM Temple Pacini R, PT [facility] None  01/19/2023  1:15 PM Temple Pacini R, PT [facility] None  01/21/2023  1:15 PM Baird Kay, PT [facility] None  01/25/2023 11:00 AM Karamalegos, Netta Neat, DO [facility]    No orders of the defined types were placed in this encounter.    -------------------------------

## 2022-11-05 ENCOUNTER — Ambulatory Visit: Payer: Medicare HMO

## 2022-11-05 DIAGNOSIS — R2681 Unsteadiness on feet: Secondary | ICD-10-CM | POA: Diagnosis not present

## 2022-11-05 DIAGNOSIS — R42 Dizziness and giddiness: Secondary | ICD-10-CM

## 2022-11-05 DIAGNOSIS — R278 Other lack of coordination: Secondary | ICD-10-CM | POA: Diagnosis not present

## 2022-11-05 DIAGNOSIS — R262 Difficulty in walking, not elsewhere classified: Secondary | ICD-10-CM

## 2022-11-05 DIAGNOSIS — M6281 Muscle weakness (generalized): Secondary | ICD-10-CM | POA: Diagnosis not present

## 2022-11-05 NOTE — Therapy (Signed)
OUTPATIENT PHYSICAL THERAPY VESTIBULAR TREATMENT NOTE     Patient Name: Joseph Arroyo MRN: [id number] DOB:1940/11/25, 82 y.o., male Today's Date: 11/05/2022  END OF SESSION:  PT End of Session - 11/05/22 0841     Visit Number 5    Number of Visits 25    Date for PT Re-Evaluation 01/11/23    PT Start Time 0846    PT Stop Time 0930    PT Time Calculation (min) 44 min    Equipment Utilized During Treatment Gait belt    Activity Tolerance Patient tolerated treatment well    Behavior During Therapy [facility] for tasks assessed/performed               Past Medical History:  Diagnosis Date   Allergic rhinitis due to pollen 11/21/2007   Brachial neuritis or radiculitis NOS    Cervicalgia    Concussion    age 65 - s/p accident   Costal chondritis    Depression    Essential and other specified forms of tremor    GERD (gastroesophageal reflux disease)    in past   Lumbago    Occlusion and stenosis of carotid artery without mention of cerebral infarction    Seizures [facility])    age 82 - after concussion   Past Surgical History:  Procedure Laterality Date   CATARACT EXTRACTION Right 07/18/14   CATARACT EXTRACTION W/PHACO Left 08/01/2014   Procedure: CATARACT EXTRACTION PHACO AND INTRAOCULAR LENS PLACEMENT (IOC);  Surgeon: Lockie Mola, MD;  Location: [facility];  Service: Ophthalmology;  Laterality: Left;  IVA TOPICAL   COLONOSCOPY  2008   OTHER SURGICAL HISTORY  2002   ear surgery   STAPEDES SURGERY Right 2002   Patient Active Problem List   Diagnosis Date Noted   Dizziness 10/02/2022   Lumbar facet joint pain 06/25/2022   Osteoarthritis of facet joint of lumbar spine 06/25/2022   Lumbosacral facet joint hypertrophy 06/25/2022   Parkinson's disease with dyskinesia 01/27/2022   Dysphagia, pharyngeal phase 09/17/2021   Cervicalgia 08/05/2021   Spondylosis without myelopathy or radiculopathy, lumbosacral region 06/24/2021   Lumbar central spinal stenosis,  w/o neurogenic claudication (L4-5) 06/03/2021   Lumbar lateral recess stenosis (Bilateral: L2-3, L4-5) (Right: L3-4) 06/03/2021   Lumbosacral foraminal stenosis (Bilateral: L2-3, L3-4) (Left: L5-S1) 06/03/2021   Lumbar nerve root impingement (Right: L3 at L2-3 & L3-4) 06/03/2021   Ligamentum flavum hypertrophy (L3-4, L4-5) 06/03/2021   Abnormal MRI, lumbar spine (04/23/2021) 04/24/2021   Long term prescription benzodiazepine use (alprazolam) (Xanax) 03/24/2021   Grade 1 Retrolisthesis of L2/L3 (5 mm) and L3/L4 (3 mm) 03/24/2021   Levoscoliosis of lumbar spine (L3-4 apex) 03/24/2021   DDD (degenerative disc disease), lumbosacral 03/24/2021   Lumbosacral facet arthropathy (Left: L3-4, L4-5, and L5-S1) 03/24/2021   Tricompartment osteoarthritis of knee (Left) 03/24/2021   Baker cyst (Left) 03/24/2021   Chronic low back pain (1ry area of Pain) (Bilateral) (R>L) w/o sciatica 03/24/2021   Lumbar facet syndrome (Bilateral) 03/24/2021   Chronic lower extremity pain (2ry area of Pain) (Bilateral) (L>R) 03/24/2021   Lumbosacral radiculitis/sensory radiculopathy at L2 (Bilateral) 03/24/2021   Lumbosacral radiculitis/sensory radiculopathy at L3 (Bilateral) 03/24/2021   Chronic pain syndrome 03/23/2021   Pharmacologic therapy 03/23/2021   Disorder of skeletal system 03/23/2021   Problems influencing health status 03/23/2021   PAD (peripheral artery disease) (HCC) 10/01/2020   GAD (generalized anxiety disorder) 01/13/2018   MDD (major depressive disorder) 01/13/2018   Umbilical hernia 09/04/2017   Chronic knee pain (  Left) 08/11/2017   Derangement of medial meniscus, posterior horn (Left) 08/11/2017   Vitamin D insufficiency 03/05/2016   BPH without obstruction/lower urinary tract symptoms 07/08/2015   Chronic fatigue 10/23/2014   Major depression, recurrent, full remission (HCC) 07/26/2013   Unsteady gait 07/26/2013   Orthostatic hypotension 07/26/2013   Depression 07/26/2013   Anxiety  06/23/2013   Chronic anxiety 06/23/2013   Tremor 05/18/2013   Bilateral carotid artery stenosis 08/30/2012    PCP: Smitty Cords, DO  REFERRING PROVIDER:   Mecum, Oswaldo Conroy, PA-C    REFERRING DIAG: R42 (ICD-10-CM) - Dizziness   THERAPY DIAG:  Dizziness and giddiness  Unsteadiness on feet  Other lack of coordination  Difficulty in walking, not elsewhere classified  ONSET DATE: August 1st 2024  Rationale for Evaluation and Treatment: Rehabilitation  SUBJECTIVE:   SUBJECTIVE STATEMENT Pt feeling somewhat unsteady this morning. Pt reports no spinning since last visit. He is still experiencing an unsteady-type dizziness.  He is unsure if unsteadiness feelings are due to his medications.     Pt accompanied by: significant other   PERTINENT HISTORY:   The pt is a pleasant 82 yo male referred to PT for dizziness. Pt known to clinic, previously seen for falls in the setting of PD.  Pt spouse present for eval and reports pt was seen by neurologist recently at [facility] following onset of dizziness about two-three weeks ago. He reports they think he has BPPV.  PT spouse reports he had blood work done at this visit which "didn't show anything" and was normal. Pt unsure if his symptoms come on when he is still at rest. He describes his symptoms as spinning. This can be triggered when he lies on his back and also when he sits up from supine. Spinning lasts for minutes. He reports no nausea.  Pt sometimes sees double when he looks to the side and this has been going on for weeks. Denies any one sided weakness or other changes. He has fallen 4 times in past 3 months. Pt spouse reports pt had a fall about a week prior to dizziness onset. Pt reports no neck pain, has hx of LBP. His spouse reports decline in his mobility since this started. Pt uses RW at baseline for ambulation. Pt PMH significant for Parkinson's disease, concussion, cervicalgia, depression, seizures, chronic L knee pain,  chronic anxiety, DDD, PAD, chronic pain syndrome, chronic low back pain, please refer to chart for full, extensive, PMH. PAIN:  Are you having pain? No  PRECAUTIONS: Fall    WEIGHT BEARING RESTRICTIONS: No  FALLS: Has patient fallen in last 6 months? Yes. Number of falls 4  LIVING ENVIRONMENT: via chart and confirmed by pt in session Lives with: lives with their spouse Lives in: House/apartment Stairs: 4 Has following equipment at home: Environmental consultant - 2 wheeled  PLOF:  needs assistance, pt with PD that affected his mobility/ADLs  PATIENT GOALS: get rid of dizziness  OBJECTIVE: taken at eval unless specified otherwise  DIAGNOSTIC FINDINGS:  No recent pertinent imaging per chart  COGNITION: Overall cognitive status: Within functional limits for tasks assessed   SENSATION: Not tested  POSTURE:  Crouched posture, significant thoracic kyphosis, forward head posture  Cervical ROM:   AROM screen - pt exhibits cervical rotation bilat and flexion/ext that is pain-free that is sufficient for requirements of positional testing and vestibular testing    BED MOBILITY:  Impaired due to mobility and dizziness with lying down/rolling  TRANSFERS: Assistive device utilized: Environmental consultant -  2 wheeled  Sit to stand: Mod A Stand to sit: Min A   GAIT: Gait pattern:  crouched/forward posture, decreased gait speed observed, decreased step-length bilat, movement rigid, pt requires use of RW and close CGA Distance walked: 20 ft in clinic Assistive device utilized: Walker - 2 wheeled Level of assistance:  close CGA Comments: pt reports no dizziness with gait, no LOB during gait  FUNCTIONAL TESTS:  Timed up and go (TUG): deferred to future visit - deferred to future visit   PATIENT SURVEYS:  FOTO 44  VESTIBULAR ASSESSMENT:  SYMPTOM BEHAVIOR:  Subjective history: pt with positionally triggered vertigo per report  Non-Vestibular symptoms: diplopia  Type of dizziness: Spinning/Vertigo  and "World moves", unsteady  Frequency: with seated>supine and supine>seated  Duration: pt reports minutes   OCULOMOTOR EXAM: room light  Ocular Alignment: normal  Ocular ROM: No Limitations  Spontaneous Nystagmus: absent  Gaze-Induced Nystagmus: absent  Smooth Pursuits: saccades   Saccades: dysmetria   Convergence/Divergence: not tested    VESTIBULAR - OCULAR REFLEX:   Slow VOR: slightly saccadic  VOR Cancellation: Comment: deferred HIT: WNL bilaterally      POSITIONAL TESTING: Right Dix-Hallpike: completed with vestibular goggles donned/fixation suppressed - first DH pt has jerk nystagmus, R torsional up beating <60 sec duration.  Second reassessment in R [facility] pt exhibits possible ageotropic nystagmus, unclear if multi-canal involvement or canal conversion. Nystagmus duration >60 sec, but does appear to fatigue    Other tests:  Cross-cover test: no vertical skew   VESTIBULAR TREATMENT:                                                                                                   DATE: 11/05/22   Gait belt donned and PT provided CGA throughout to ensure pt safety   NMR: VORx1, seated, horizontal and vertical head turns x 60 sec of each - no difficulty, no dizziness TUG: 18 sec with RW - pt reports feeling a little unsteady with testing  DHI: 36 - taken to assess remaining deficits due to dizziness.  Pt with most difficulty with restricting social activities due to dizziness and performing household activities. Pt still with some difficulty with quick head turns. Score indicates low perception of handicap due to dizziness  Gait with slow head turns - vertical and horizontal - performed over 10 meters x several minutes and several reps of each. PT cues pt to decrease speed of head turn throughout to decrease dizziness/unsteadiness feelings. Pt more symptomatic with horizontal head turns.   PATIENT EDUCATION: Education details: Pt educated throughout session about proper  posture and technique with exercises. Improved exercise technique, movement at target joints, use of target muscles after min to mod verbal, visual, tactile cues. Post-maneuver precautions.  Person educated: Patient and Spouse Education method: Explanation, Demonstration, Tactile cues, and Verbal cues Education comprehension: verbalized understanding and returned demonstration  HOME EXERCISE PROGRAM:  GOALS:   Goals reviewed with patient? Yes regarding goals of maneuvers - majority of goal testing to be initiated next visit, deferred today due ot time  SHORT TERM GOALS: Target date:  12/17/2022   Patient will be independent in home exercise program to improve dizziness, balance and mobility for better safety and functional independence with ADLs. Baseline: to be initiated future visit/as needed Goal status: INITIAL   LONG TERM GOALS: Target date: 01/28/2023    Patient will increase FOTO score to equal to or greater than 53    to demonstrate statistically significant improvement in mobility and quality of life.  Baseline: 44 Goal status: INITIAL  2.  Patient will reduce dizziness handicap inventory score to <50, for less dizziness with ADLs and increased safety with home and work tasks.  Baseline: deferred, will complete as pt able Goal status: INITIAL  3.  The pt will report at least a 60% decrease in positional or movement triggered dizziness symptoms in order to indicate decreased fall risk and increased balance/safety with mobility.  Baseline: pt presents with decreased mobility due to dizziness, dizziness/vertigo triggered by particular positional; changes Goal status: INITIAL  4.  Patient will reduce timed up and go to <11 seconds to reduce fall risk and demonstrate improved transfer/gait ability.  Baseline: 9/5: 18 sec  Goal status: INITIAL   ASSESSMENT:  CLINICAL IMPRESSION: Pt reports no spinning dizziness since last treatment, but did report he still experiences  residual unsteady feelings. PT instructed pt and spouse to monitor his symptoms over the next few days and return to next appointment for follow-up. PT instructed pt that if he continues to have no further spinning symptoms, that he would benefit to transitioning to PT focused on addressing balance and PD impairments. Pt and spouse verbalized understanding and agreeable to this plan. The pt will benefit from further skilled PT to address these deficits in order to decrease fall risk, dizziness and improve balance/QOL.     OBJECTIVE IMPAIRMENTS: Abnormal gait, decreased activity tolerance, decreased balance, decreased coordination, decreased mobility, difficulty walking, decreased strength, dizziness, impaired vision/preception, improper body mechanics, and postural dysfunction.   ACTIVITY LIMITATIONS: lifting, bending, squatting, stairs, transfers, bed mobility, toileting, dressing, hygiene/grooming, and locomotion level  PARTICIPATION LIMITATIONS: meal prep, cleaning, laundry, shopping, community activity, and yard work  PERSONAL FACTORS: Age, Fitness, and 3+ comorbidities: Pt PMH significant for Parkinson's disease, concussion, cervicalgia, depression, seizures, chronic L knee pain, chronic anxiety, DDD, PAD, chronic pain syndrome, chronic low back pain, please refer to chart for full, extensive, PMH.  are also affecting patient's functional outcome.   REHAB POTENTIAL: Good  CLINICAL DECISION MAKING: Evolving/moderate complexity  EVALUATION COMPLEXITY: Moderate   PLAN:  PT FREQUENCY: 1-2x/week  PT DURATION: 12 weeks  PLANNED INTERVENTIONS: Therapeutic exercises, Therapeutic activity, Neuromuscular re-education, Balance training, Gait training, Patient/Family education, Self Care, Joint mobilization, Stair training, Vestibular training, Canalith repositioning, Visual/preceptual remediation/compensation, DME instructions, Electrical stimulation, Wheelchair mobility training, Spinal  mobilization, Cryotherapy, Moist heat, Splintting, Taping, Traction, Manual therapy, and Re-evaluation  PLAN FOR NEXT SESSION: further testing as pt able, balance, habituation interventions   Baird Kay, PT 11/05/2022, 5:36 PM

## 2022-11-12 ENCOUNTER — Ambulatory Visit: Payer: Medicare HMO

## 2022-11-12 DIAGNOSIS — R278 Other lack of coordination: Secondary | ICD-10-CM | POA: Diagnosis not present

## 2022-11-12 DIAGNOSIS — R2681 Unsteadiness on feet: Secondary | ICD-10-CM | POA: Diagnosis not present

## 2022-11-12 DIAGNOSIS — R262 Difficulty in walking, not elsewhere classified: Secondary | ICD-10-CM

## 2022-11-12 DIAGNOSIS — R42 Dizziness and giddiness: Secondary | ICD-10-CM | POA: Diagnosis not present

## 2022-11-12 DIAGNOSIS — M6281 Muscle weakness (generalized): Secondary | ICD-10-CM | POA: Diagnosis not present

## 2022-11-12 NOTE — Therapy (Signed)
OUTPATIENT PHYSICAL THERAPY VESTIBULAR TREATMENT      Patient Name: Joseph Arroyo MRN: [id number] DOB:12-Mar-1940, 82 y.o., male Today's Date: 11/12/2022  END OF SESSION:  PT End of Session - 11/12/22 1406     Visit Number 6    Number of Visits 25    Date for PT Re-Evaluation 01/11/23    PT Start Time 1400    PT Stop Time 1440    PT Time Calculation (min) 40 min               Past Medical History:  Diagnosis Date   Allergic rhinitis due to pollen 11/21/2007   Brachial neuritis or radiculitis NOS    Cervicalgia    Concussion    age 49 - s/p accident   Costal chondritis    Depression    Essential and other specified forms of tremor    GERD (gastroesophageal reflux disease)    in past   Lumbago    Occlusion and stenosis of carotid artery without mention of cerebral infarction    Seizures [facility])    age 33 - after concussion   Past Surgical History:  Procedure Laterality Date   CATARACT EXTRACTION Right 07/18/14   CATARACT EXTRACTION W/PHACO Left 08/01/2014   Procedure: CATARACT EXTRACTION PHACO AND INTRAOCULAR LENS PLACEMENT (IOC);  Surgeon: Lockie Mola, MD;  Location: [facility];  Service: Ophthalmology;  Laterality: Left;  IVA TOPICAL   COLONOSCOPY  2008   OTHER SURGICAL HISTORY  2002   ear surgery   STAPEDES SURGERY Right 2002   Patient Active Problem List   Diagnosis Date Noted   Dizziness 10/02/2022   Lumbar facet joint pain 06/25/2022   Osteoarthritis of facet joint of lumbar spine 06/25/2022   Lumbosacral facet joint hypertrophy 06/25/2022   Parkinson's disease with dyskinesia 01/27/2022   Dysphagia, pharyngeal phase 09/17/2021   Cervicalgia 08/05/2021   Spondylosis without myelopathy or radiculopathy, lumbosacral region 06/24/2021   Lumbar central spinal stenosis, w/o neurogenic claudication (L4-5) 06/03/2021   Lumbar lateral recess stenosis (Bilateral: L2-3, L4-5) (Right: L3-4) 06/03/2021   Lumbosacral foraminal stenosis  (Bilateral: L2-3, L3-4) (Left: L5-S1) 06/03/2021   Lumbar nerve root impingement (Right: L3 at L2-3 & L3-4) 06/03/2021   Ligamentum flavum hypertrophy (L3-4, L4-5) 06/03/2021   Abnormal MRI, lumbar spine (04/23/2021) 04/24/2021   Long term prescription benzodiazepine use (alprazolam) (Xanax) 03/24/2021   Grade 1 Retrolisthesis of L2/L3 (5 mm) and L3/L4 (3 mm) 03/24/2021   Levoscoliosis of lumbar spine (L3-4 apex) 03/24/2021   DDD (degenerative disc disease), lumbosacral 03/24/2021   Lumbosacral facet arthropathy (Left: L3-4, L4-5, and L5-S1) 03/24/2021   Tricompartment osteoarthritis of knee (Left) 03/24/2021   Baker cyst (Left) 03/24/2021   Chronic low back pain (1ry area of Pain) (Bilateral) (R>L) w/o sciatica 03/24/2021   Lumbar facet syndrome (Bilateral) 03/24/2021   Chronic lower extremity pain (2ry area of Pain) (Bilateral) (L>R) 03/24/2021   Lumbosacral radiculitis/sensory radiculopathy at L2 (Bilateral) 03/24/2021   Lumbosacral radiculitis/sensory radiculopathy at L3 (Bilateral) 03/24/2021   Chronic pain syndrome 03/23/2021   Pharmacologic therapy 03/23/2021   Disorder of skeletal system 03/23/2021   Problems influencing health status 03/23/2021   PAD (peripheral artery disease) (HCC) 10/01/2020   GAD (generalized anxiety disorder) 01/13/2018   MDD (major depressive disorder) 01/13/2018   Umbilical hernia 09/04/2017   Chronic knee pain (Left) 08/11/2017   Derangement of medial meniscus, posterior horn (Left) 08/11/2017   Vitamin D insufficiency 03/05/2016   BPH without obstruction/lower urinary tract symptoms 07/08/2015  Chronic fatigue 10/23/2014   Major depression, recurrent, full remission (HCC) 07/26/2013   Unsteady gait 07/26/2013   Orthostatic hypotension 07/26/2013   Depression 07/26/2013   Anxiety 06/23/2013   Chronic anxiety 06/23/2013   Tremor 05/18/2013   Bilateral carotid artery stenosis 08/30/2012    PCP: Smitty Cords, DO  REFERRING PROVIDER:    Mecum, Oswaldo Conroy, PA-C    REFERRING DIAG: R42 (ICD-10-CM) - Dizziness   THERAPY DIAG:  Dizziness and giddiness  Unsteadiness on feet  Other lack of coordination  Difficulty in walking, not elsewhere classified  ONSET DATE: August 1st 2024  Rationale for Evaluation and Treatment: Rehabilitation  SUBJECTIVE:   SUBJECTIVE STATEMENT  Doing ok today. Still has a lot of dizziness with rolling in bed, 1-2 minute episodes. No other symptoms with standing, seated, upright activity.   No HEP performance assigned.  Pt accompanied by: significant other   PERTINENT HISTORY:   The pt is a pleasant 82 yo male referred to PT for dizziness. Pt known to clinic, previously seen for falls in the setting of PD.  Pt spouse present for eval and reports pt was seen by neurologist recently at [facility] following onset of dizziness about two-three weeks ago. He reports they think he has BPPV.  PT spouse reports he had blood work done at this visit which "didn't show anything" and was normal. Pt unsure if his symptoms come on when he is still at rest. He describes his symptoms as spinning. This can be triggered when he lies on his back and also when he sits up from supine. Spinning lasts for minutes. He reports no nausea.  Pt sometimes sees double when he looks to the side and this has been going on for weeks. Denies any one sided weakness or other changes. He has fallen 4 times in past 3 months. Pt spouse reports pt had a fall about a week prior to dizziness onset. Pt reports no neck pain, has hx of LBP. His spouse reports decline in his mobility since this started. Pt uses RW at baseline for ambulation. Pt PMH significant for Parkinson's disease, concussion, cervicalgia, depression, seizures, chronic L knee pain, chronic anxiety, DDD, PAD, chronic pain syndrome, chronic low back pain, please refer to chart for full, extensive, PMH. PAIN:  Are you having pain? No  PRECAUTIONS: Fall    WEIGHT BEARING  RESTRICTIONS: No  FALLS: Has patient fallen in last 6 months? Yes. Number of falls 4  LIVING ENVIRONMENT: via chart and confirmed by pt in session Lives with: lives with their spouse Lives in: House/apartment Stairs: 4 Has following equipment at home: Walker - 2 wheeled  PLOF:  needs assistance, pt with PD that affected his mobility/ADLs  PATIENT GOALS: get rid of dizziness  OBJECTIVE:    VESTIBULAR TREATMENT:                                                                                                   DATE: 11/12/22   NMR: Gaze fixation, seated, horizontal and vertical head turns 2x 60 sec of each *back hurting while seated on table,  moved to chair with hot pack applied to back  97/45mmHg  56bpm seated  90/66mmhg  65bpm standing (no symptoms)   Tug performance 1x8, 1x2 with 2.5lb AW bilat, RW. Alternating turn directions. (Heat applied ot back between sets.    PATIENT EDUCATION: Education details: keep ROM smaller with horizonal head turns, limit speed based off symptoms  HOME EXERCISE PROGRAM:  GOALS:   Goals reviewed with patient? Yes regarding goals of maneuvers - majority of goal testing to be initiated next visit, deferred today due ot time  SHORT TERM GOALS: Target date: 12/24/2022   Patient will be independent in home exercise program to improve dizziness, balance and mobility for better safety and functional independence with ADLs. Baseline: to be initiated future visit/as needed Goal status: INITIAL   LONG TERM GOALS: Target date: 02/04/2023    Patient will increase FOTO score to equal to or greater than 53    to demonstrate statistically significant improvement in mobility and quality of life.  Baseline: 44 Goal status: INITIAL  2.  Patient will reduce dizziness handicap inventory score to <50, for less dizziness with ADLs and increased safety with home and work tasks.  Baseline: deferred, will complete as pt able Goal status: INITIAL  3.  The  pt will report at least a 60% decrease in positional or movement triggered dizziness symptoms in order to indicate decreased fall risk and increased balance/safety with mobility.  Baseline: pt presents with decreased mobility due to dizziness, dizziness/vertigo triggered by particular positional; changes Goal status: INITIAL  4.  Patient will reduce timed up and go to <11 seconds to reduce fall risk and demonstrate improved transfer/gait ability.  Baseline: 9/5: 18 sec  Goal status: INITIAL   ASSESSMENT:  CLINICAL IMPRESSION: Pt reports no spinning dizziness in session, but stil having BPPV-like symptoms with rolling in bed. Good tolerance to session, with exception to unsupported back during 10 mkinutes shor tsiting, pt moved to chair with back and heat which helps a lot.  Pt and spouse verbalized understanding and agreeable to this plan. The pt will benefit from further skilled PT to address these deficits in order to decrease fall risk, dizziness and improve balance/QOL.     OBJECTIVE IMPAIRMENTS: Abnormal gait, decreased activity tolerance, decreased balance, decreased coordination, decreased mobility, difficulty walking, decreased strength, dizziness, impaired vision/preception, improper body mechanics, and postural dysfunction.   ACTIVITY LIMITATIONS: lifting, bending, squatting, stairs, transfers, bed mobility, toileting, dressing, hygiene/grooming, and locomotion level  PARTICIPATION LIMITATIONS: meal prep, cleaning, laundry, shopping, community activity, and yard work  PERSONAL FACTORS: Age, Fitness, and 3+ comorbidities: Pt PMH significant for Parkinson's disease, concussion, cervicalgia, depression, seizures, chronic L knee pain, chronic anxiety, DDD, PAD, chronic pain syndrome, chronic low back pain, please refer to chart for full, extensive, PMH.  are also affecting patient's functional outcome.   REHAB POTENTIAL: Good  CLINICAL DECISION MAKING: Evolving/moderate  complexity  EVALUATION COMPLEXITY: Moderate   PLAN:  PT FREQUENCY: 1-2x/week  PT DURATION: 12 weeks  PLANNED INTERVENTIONS: Therapeutic exercises, Therapeutic activity, Neuromuscular re-education, Balance training, Gait training, Patient/Family education, Self Care, Joint mobilization, Stair training, Vestibular training, Canalith repositioning, Visual/preceptual remediation/compensation, DME instructions, Electrical stimulation, Wheelchair mobility training, Spinal mobilization, Cryotherapy, Moist heat, Splintting, Taping, Traction, Manual therapy, and Re-evaluation  PLAN FOR NEXT SESSION: further testing as pt able, balance, habituation interventions    Brach Birdsall C, PT 11/12/2022, 2:13 PM  2:49 PM, 11/12/22 Rosamaria Lints, PT, DPT Physical Therapist - [facility]  [phone] [facility])

## 2022-11-19 ENCOUNTER — Ambulatory Visit: Payer: Medicare HMO

## 2022-11-19 DIAGNOSIS — R278 Other lack of coordination: Secondary | ICD-10-CM | POA: Diagnosis not present

## 2022-11-19 DIAGNOSIS — M6281 Muscle weakness (generalized): Secondary | ICD-10-CM | POA: Diagnosis not present

## 2022-11-19 DIAGNOSIS — R42 Dizziness and giddiness: Secondary | ICD-10-CM

## 2022-11-19 DIAGNOSIS — R262 Difficulty in walking, not elsewhere classified: Secondary | ICD-10-CM | POA: Diagnosis not present

## 2022-11-19 DIAGNOSIS — R2681 Unsteadiness on feet: Secondary | ICD-10-CM

## 2022-11-19 NOTE — Therapy (Signed)
OUTPATIENT PHYSICAL THERAPY VESTIBULAR TREATMENT      Patient Name: Joseph Arroyo MRN: [id number] DOB:August 02, 1940, 82 y.o., male Today's Date: 11/19/2022  END OF SESSION:  PT End of Session - 11/19/22 1409     Visit Number 7    Number of Visits 25    Date for PT Re-Evaluation 01/11/23    Authorization Type [employer]    Progress Note Due on Visit 10    PT Start Time 1400    PT Stop Time 1440    PT Time Calculation (min) 40 min    Equipment Utilized During Treatment Gait belt    Activity Tolerance Patient tolerated treatment well;No increased pain    Behavior During Therapy [facility] for tasks assessed/performed               Past Medical History:  Diagnosis Date   Allergic rhinitis due to pollen 11/21/2007   Brachial neuritis or radiculitis NOS    Cervicalgia    Concussion    age 32 - s/p accident   Costal chondritis    Depression    Essential and other specified forms of tremor    GERD (gastroesophageal reflux disease)    in past   Lumbago    Occlusion and stenosis of carotid artery without mention of cerebral infarction    Seizures [facility])    age 43 - after concussion   Past Surgical History:  Procedure Laterality Date   CATARACT EXTRACTION Right 07/18/14   CATARACT EXTRACTION W/PHACO Left 08/01/2014   Procedure: CATARACT EXTRACTION PHACO AND INTRAOCULAR LENS PLACEMENT (IOC);  Surgeon: Lockie Mola, MD;  Location: [facility];  Service: Ophthalmology;  Laterality: Left;  IVA TOPICAL   COLONOSCOPY  2008   OTHER SURGICAL HISTORY  2002   ear surgery   STAPEDES SURGERY Right 2002   Patient Active Problem List   Diagnosis Date Noted   Dizziness 10/02/2022   Lumbar facet joint pain 06/25/2022   Osteoarthritis of facet joint of lumbar spine 06/25/2022   Lumbosacral facet joint hypertrophy 06/25/2022   Parkinson's disease with dyskinesia 01/27/2022   Dysphagia, pharyngeal phase 09/17/2021   Cervicalgia 08/05/2021   Spondylosis without  myelopathy or radiculopathy, lumbosacral region 06/24/2021   Lumbar central spinal stenosis, w/o neurogenic claudication (L4-5) 06/03/2021   Lumbar lateral recess stenosis (Bilateral: L2-3, L4-5) (Right: L3-4) 06/03/2021   Lumbosacral foraminal stenosis (Bilateral: L2-3, L3-4) (Left: L5-S1) 06/03/2021   Lumbar nerve root impingement (Right: L3 at L2-3 & L3-4) 06/03/2021   Ligamentum flavum hypertrophy (L3-4, L4-5) 06/03/2021   Abnormal MRI, lumbar spine (04/23/2021) 04/24/2021   Long term prescription benzodiazepine use (alprazolam) (Xanax) 03/24/2021   Grade 1 Retrolisthesis of L2/L3 (5 mm) and L3/L4 (3 mm) 03/24/2021   Levoscoliosis of lumbar spine (L3-4 apex) 03/24/2021   DDD (degenerative disc disease), lumbosacral 03/24/2021   Lumbosacral facet arthropathy (Left: L3-4, L4-5, and L5-S1) 03/24/2021   Tricompartment osteoarthritis of knee (Left) 03/24/2021   Baker cyst (Left) 03/24/2021   Chronic low back pain (1ry area of Pain) (Bilateral) (R>L) w/o sciatica 03/24/2021   Lumbar facet syndrome (Bilateral) 03/24/2021   Chronic lower extremity pain (2ry area of Pain) (Bilateral) (L>R) 03/24/2021   Lumbosacral radiculitis/sensory radiculopathy at L2 (Bilateral) 03/24/2021   Lumbosacral radiculitis/sensory radiculopathy at L3 (Bilateral) 03/24/2021   Chronic pain syndrome 03/23/2021   Pharmacologic therapy 03/23/2021   Disorder of skeletal system 03/23/2021   Problems influencing health status 03/23/2021   PAD (peripheral artery disease) (HCC) 10/01/2020   GAD (generalized anxiety disorder)  01/13/2018   MDD (major depressive disorder) 01/13/2018   Umbilical hernia 09/04/2017   Chronic knee pain (Left) 08/11/2017   Derangement of medial meniscus, posterior horn (Left) 08/11/2017   Vitamin D insufficiency 03/05/2016   BPH without obstruction/lower urinary tract symptoms 07/08/2015   Chronic fatigue 10/23/2014   Major depression, recurrent, full remission (HCC) 07/26/2013   Unsteady gait  07/26/2013   Orthostatic hypotension 07/26/2013   Depression 07/26/2013   Anxiety 06/23/2013   Chronic anxiety 06/23/2013   Tremor 05/18/2013   Bilateral carotid artery stenosis 08/30/2012    PCP: Smitty Cords, DO  REFERRING PROVIDER:   Mecum, Oswaldo Conroy, PA-C    REFERRING DIAG: R42 (ICD-10-CM) - Dizziness   THERAPY DIAG:  Dizziness and giddiness  Unsteadiness on feet  Other lack of coordination  Difficulty in walking, not elsewhere classified  Muscle weakness (generalized)  ONSET DATE: August 1st 2024  Rationale for Evaluation and Treatment: Rehabilitation  SUBJECTIVE:   SUBJECTIVE STATEMENT Doing ok today. Still has a lot of dizziness with lying down, reports that he has visual disturbance with light fixture, says the beating is not horizontal or torsional, but rather says the fixture is moving away from him. 1-2 minute episodes. No other symptoms with standing, seated, upright activity.   No HEP performance assigned.  Pt accompanied by: significant other   PERTINENT HISTORY:  The pt is a pleasant 82 yo male referred to PT for dizziness. Pt known to clinic, previously seen for falls in the setting of PD.  Pt spouse present for eval and reports pt was seen by neurologist recently at [facility] following onset of dizziness about two-three weeks ago. He reports they think he has BPPV.  PT spouse reports he had blood work done at this visit which "didn't show anything" and was normal. Pt unsure if his symptoms come on when he is still at rest. He describes his symptoms as spinning. This can be triggered when he lies on his back and also when he sits up from supine. Spinning lasts for minutes. He reports no nausea.  Pt sometimes sees double when he looks to the side and this has been going on for weeks. Denies any one sided weakness or other changes. He has fallen 4 times in past 3 months. Pt spouse reports pt had a fall about a week prior to dizziness onset. Pt reports no  neck pain, has hx of LBP. His spouse reports decline in his mobility since this started. Pt uses RW at baseline for ambulation. Pt PMH significant for Parkinson's disease, concussion, cervicalgia, depression, seizures, chronic L knee pain, chronic anxiety, DDD, PAD, chronic pain syndrome, chronic low back pain, please refer to chart for full, extensive, PMH. PAIN:  Are you having pain? No Back pain hurting prior, took 3 ibuprophen.   PRECAUTIONS: Fall   WEIGHT BEARING RESTRICTIONS: No  FALLS: Has patient fallen in last 6 months? Yes. Number of falls 4  LIVING ENVIRONMENT: via chart and confirmed by pt in session Lives with: lives with their spouse Lives in: House/apartment Stairs: 4 Has following equipment at home: Walker - 2 wheeled  PLOF:  needs assistance, pt with PD that affected his mobility/ADLs  PATIENT GOALS: get rid of dizziness  OBJECTIVE:    VESTIBULAR TREATMENT:  DATE: 11/19/22  11/19/22: -hot pack applied to low back while seated in chair -vor hor x30s -EC x30 -vor hor x30s -EC x30 -vor vert x30s -EC x30  -vor vert x30s -EC x30   -vergence practice, 2 cones 4 and 6 ft away from patient x15 -TUG practice x3, pillow in chair, RW used, falls mat mid path, turns around dumbbell alternating directions (minguard assist)   -Left dix hallpike: negative  -Right dix hallpike: postiive for ageotropic torsion beating *went straight into eply maneuver    11/17/22 NMR: Gaze fixation, seated, horizontal and vertical head turns 2x 60 sec of each *back hurting while seated on table, moved to chair with hot pack applied to back  97/77mmHg  56bpm seated  90/27mmhg  65bpm standing (no symptoms)   Tug performance 1x8, 1x2 with 2.5lb AW bilat, RW. Alternating turn directions. (Heat applied ot back between sets.    PATIENT EDUCATION: Education details: keep ROM smaller with  horizonal head turns, limit speed based off symptoms  HOME EXERCISE PROGRAM:  GOALS:   Goals reviewed with patient? Yes regarding goals of maneuvers - majority of goal testing to be initiated next visit, deferred today due ot time  SHORT TERM GOALS: Target date: 12/31/2022   Patient will be independent in home exercise program to improve dizziness, balance and mobility for better safety and functional independence with ADLs. Baseline: to be initiated future visit/as needed Goal status: INITIAL   LONG TERM GOALS: Target date: 02/11/2023    Patient will increase FOTO score to equal to or greater than 53    to demonstrate statistically significant improvement in mobility and quality of life.  Baseline: 44 Goal status: INITIAL  2.  Patient will reduce dizziness handicap inventory score to <50, for less dizziness with ADLs and increased safety with home and work tasks.  Baseline: deferred, will complete as pt able Goal status: INITIAL  3.  The pt will report at least a 60% decrease in positional or movement triggered dizziness symptoms in order to indicate decreased fall risk and increased balance/safety with mobility.  Baseline: pt presents with decreased mobility due to dizziness, dizziness/vertigo triggered by particular positional; changes Goal status: INITIAL  4.  Patient will reduce timed up and go to <11 seconds to reduce fall risk and demonstrate improved transfer/gait ability.  Baseline: 9/5: 18 sec  Goal status: INITIAL   ASSESSMENT:  CLINICAL IMPRESSION: Pt still having most provocation of symptoms while in bed. Today, gaze stabilization shows improvement in motor function compared to prior session. Finished with retesting of BPPV, positive dix hallpike right, but nystagmus indicates further assessment of horizontal canal next session. Treated using eply maneuver regardless as pt has shown benefit from this in the past. The pt will benefit from further skilled PT to  address these deficits in order to decrease fall risk, dizziness and improve balance/QOL.     OBJECTIVE IMPAIRMENTS: Abnormal gait, decreased activity tolerance, decreased balance, decreased coordination, decreased mobility, difficulty walking, decreased strength, dizziness, impaired vision/preception, improper body mechanics, and postural dysfunction.   ACTIVITY LIMITATIONS: lifting, bending, squatting, stairs, transfers, bed mobility, toileting, dressing, hygiene/grooming, and locomotion level  PARTICIPATION LIMITATIONS: meal prep, cleaning, laundry, shopping, community activity, and yard work  PERSONAL FACTORS: Age, Fitness, and 3+ comorbidities: Pt PMH significant for Parkinson's disease, concussion, cervicalgia, depression, seizures, chronic L knee pain, chronic anxiety, DDD, PAD, chronic pain syndrome, chronic low back pain, please refer to chart for full, extensive, PMH.  are also affecting patient's functional outcome.  REHAB POTENTIAL: Good  CLINICAL DECISION MAKING: Evolving/moderate complexity  EVALUATION COMPLEXITY: Moderate   PLAN:  PT FREQUENCY: 1-2x/week  PT DURATION: 12 weeks  PLANNED INTERVENTIONS: Therapeutic exercises, Therapeutic activity, Neuromuscular re-education, Balance training, Gait training, Patient/Family education, Self Care, Joint mobilization, Stair training, Vestibular training, Canalith repositioning, Visual/preceptual remediation/compensation, DME instructions, Electrical stimulation, Wheelchair mobility training, Spinal mobilization, Cryotherapy, Moist heat, Splintting, Taping, Traction, Manual therapy, and Re-evaluation  PLAN FOR NEXT SESSION: further testing as pt able, balance, habituation interventions    Tiffeny Minchew C, PT 11/19/2022, 2:11 PM  2:11 PM, 11/19/22 Rosamaria Lints, PT, DPT Physical Therapist - [facility]  Outpatient Physical Therapy- Main Campus [phone]     2:11 PM, 11/19/22 Rosamaria Lints, PT, DPT Physical Therapist - [facility]  [phone] [facility])

## 2022-11-20 ENCOUNTER — Other Ambulatory Visit: Payer: Medicare HMO

## 2022-11-20 ENCOUNTER — Other Ambulatory Visit: Payer: Self-pay

## 2022-11-20 NOTE — Patient Outreach (Signed)
Care Management   Visit Note  11/20/2022 Name: Joseph Arroyo MRN: [id number] DOB: 23-Sep-1940  Subjective: Joseph Arroyo is a 82 y.o. year old male who is a primary care patient of Smitty Cords, DO. The Care Management team was consulted for assistance.      Engaged with patient spoke with the family member (POA, [place], [place]).    Goals Addressed             This Visit's Progress    RNCM Care Management  Expected Outcome:  Monitor, Self-Manage and Reduce Symptoms of: Anxiety       Current Barriers:  Care Coordination needs related to social worker support, social work referral pending  in a patient with anxiety Chronic Disease Management support and education needs related to effective management of anxiety  Planned Interventions: Evaluation of current treatment plan related to anxiety and patient's adherence to plan as established by provider. The patient has good days and bad days. Works with the [facility] specialist on a regular basis. He likes to listen to worship music when he is anxious and sometimes eating helps also. He use to do a lot of writing but cannot do that as well due to his PD. He states that his faith keeps him strong and he got a little emotional on the phone. The patient overall is doing fairly well. He has seen the [facility] specialist since last outreach and is doing good with the management of his anxiety level. He has been extra busy lately with vestibular therapy but is doing good. He does not sleep good sometimes, naps during the day. The patients wife is very good support. Reflective listening and support given.  Provided education to patient re: call the office for changes in mood, anxiety, depression, or mental health needs  Reviewed medications with patient and discussed compliance. The patient is compliant with medications. Takes as prescribed Social Work referral for ongoing support and education for anxiety and mental health needs. Ongoing  support and education given from the LCSW.  Discussed plans with patient for ongoing care management follow up and provided patient with direct contact information for care management team Advised patient to discuss changes in his mood, anxiety, depression, or mental health  with provider Screening for signs and symptoms of depression related to chronic disease state  Assessed social determinant of health barriers  Symptom Management: Take medications as prescribed   Attend all scheduled provider appointments Call provider office for new concerns or questions  call the Suicide and Crisis Lifeline: 988 call the [place] National Suicide Prevention Lifeline: (782)443-3304 or TTY: 802-239-4197 TTY 772 209 2597) to talk to a trained counselor call 1-800-273-TALK (toll free, 24 hour hotline) if experiencing a Mental Health or Behavioral Health Crisis   Follow Up Plan: Telephone follow up appointment with care management team member scheduled for: 01-29-2023 at 1145 am       RNCM Care Management Expected Outcome:  Monitor, Self-Manage and Reduce Symptoms of: Arthritis Spine, Back Pain       Current Barriers:  Care Coordination needs related to support and education for effective pain management  in a patient with Chronic pain and arthritis  Chronic Disease Management support and education needs related to effective management of chronic pain, arthritis, and scoliosis   Planned Interventions: Reviewed provider established plan for pain management. Currently the patient is stable with his pain management. He goes to the pain clinic but also is followed closely by pcp. The patients wife knows to call  for acute changes. Had a procedure recently on his back and has adequate pain medications. He has had 30 to 40% relief from back pain with his procedures. The patient had his procedure and had some relief from it. Is currently doing Vestibular PT and after this is completed he will go back to doing regular  PT. He is walking in the grocery store and seldom has to use his chair but his walker. Discussed exercises he can do while sitting in a chair or stretches he can do also. Education on pacing activity and being careful. Discussed the cubii device to use to help with leg and muscle strength. His daughter also is working with him on leg strengthening.  Has worked with PT/OT before. Has good support from his wife and family. He continues to stay mobile and active. Has not been able to get out a lot this week due to the rainy weather but his wife is planning an outing today.  Discussed importance of adherence to all scheduled medical appointments. Keeps appointments, next appointment with the pcp on 01-25-2023 at 11 am; Counseled on the importance of reporting any/all new or changed pain symptoms or management strategies to pain management provider; Advised patient to report to care team affect of pain on daily activities; Discussed use of relaxation techniques and/or diversional activities to assist with pain reduction (distraction, imagery, relaxation, massage, acupressure, TENS, heat, and cold application; Reviewed with patient prescribed pharmacological and nonpharmacological pain relief strategies; Advised patient to discuss changes in level or intensity of pain, unresolved pain, questions and concerns with provider; Screening for signs and symptoms of depression related to chronic disease state;  Assessed social determinant of health barriers;  Review of fall prevention and safety concerns  Symptom Management: Take medications as prescribed   Attend all scheduled provider appointments Call provider office for new concerns or questions  call the Suicide and Crisis Lifeline: 988 call the [place] National Suicide Prevention Lifeline: (504)581-3794 or TTY: 959 159 4846 TTY 573-171-6852) to talk to a trained counselor call 1-800-273-TALK (toll free, 24 hour hotline) if experiencing a Mental Health or  Behavioral Health Crisis   Follow Up Plan: Telephone follow up appointment with care management team member scheduled for: 01-29-2023 at 1145 am       RNCM Care Management Expected Outcome:  Monitor, Self-Manage and Reduce Symptoms of: Parkinson's       Current Barriers:  Knowledge Deficits related to resources in the area for support in meeting the needs of the patient as his Parkinson's advances and needs change  Care Coordination needs related to support from the CCM team, education needs, and ongoing support for effective management of Parkinson's and other chronic conditions in a patient with Parkinson's Disease  Chronic Disease Management support and education needs related to effective management of PD  Planned Interventions: Evaluation of current treatment plan related to PD and patient's adherence to plan as established by provider. The patient is doing well at this time. 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 His wife states that he is benefiting from the Vestibular PT. The patients wife states that he also has a new bed that was given to them by [facility] to use and this has been a blessing for them.  Advised [facility] after talking to the insurance and discussing with her husband if they want the pcp to order the hospital bed. His wife did check on some beds but states that there are so  many different ones to choose from. The patient now has a bariatric bed to use in his home. The wife received a call from the [place] [facility] and they were abel to deliver a bed to their home to use for the patient. It is a bariatric bed so it has a lot of room and it fits the patient well. The patient will be able to use the bed for as long as he needs to do so. The wife and the patient are thankful for this. Will let the pcp know that an order for a hospital bed is not needed at this time since the patients needs are currently being met.  Provided education to  patient re: the CCM team, resources available, support for the patient, information about myChart: The patients wife wishes for the link to be sent to her email at [email or web address]. This has been sent today. The patients wife also advised the need to provide a DPR for the patient for the future of speaking to the team. The patient gave permission for the [facility] to speak to his wife [name] this call and any subsequent calls.  Reviewed medications with patient and discussed compliance. The patient currently does not have a lot of medications and his wife states she is handling them well. Review of pharm D support if needed.  Collaborated with pcp and LCSW for current expressed needs  regarding PD and advancement of PD and possible future needs. Has spoke with the LCSW and knows how to reach for new questions or concerns Provided patient with PD, falls and safety, and community resource educational materials related to resources to help with effective management of PD. Has had 4 falls over the last few months but none in a month. The patient is working with PT and doing vestibular PT at this time. The patient will go back to doing regular PT after the Vestibular PT is completed.  Social Work referral for education, support, and long term planning for the patient needs with the advancement of his PD Discussed plans with patient for ongoing care management follow up and provided patient with direct contact information for care management team Advised patient to discuss changes in PD, questions or concerns with provider Screening for signs and symptoms of depression related to chronic disease state  Assessed social determinant of health barriers Encouraged the patients wife to get a notebook dedicated to the patients health needs and write down questions as she or the patient thinks of questions in regard to DME needs, changes in conditions, medication questions, and other information important to meeting the  needs of the patient.  The patient currently receives meals from [employer], has a tub bench and rails in the bathroom, frame for over the commode, and a ramp. The wife is going to call insurance about a hospital bed and let the RNCM know the desires of the patient.  Other resource that may be helpful for the patient is [facility] in [place] at [phone]. Gave the patients wife the number to the [employer] in [place], [place] to check out for a lift chair or hospital bed with all the functions the patient may need. She may also view the webpage on Facebook: [employer]. Hospital bed not needed but did review of other equipment available if needed. Will continue to monitor for changes or new needs. The patient is losing weight. He is drinking protein drinks but continues to lose weight. The recommended diet for  patients with PD is plant based; however the patients wife was asking about a carnivore diet that she heard about a lady reversing her sx and sx of PD. Will do some research and see what can be found out about this. Will let the wife know if there are new information found out and will email her with any pertinent information.   Symptom Management: Take medications as prescribed   Attend all scheduled provider appointments Call provider office for new concerns or questions  call the Suicide and Crisis Lifeline: 988 call the [place] National Suicide Prevention Lifeline: (713)387-1719 or TTY: 585-286-1358 TTY 403-638-3420) to talk to a trained counselor call 1-800-273-TALK (toll free, 24 hour hotline) if experiencing a Mental Health or Behavioral Health Crisis   Follow Up Plan: Telephone follow up appointment with care management team member scheduled for: 01-29-2023 at 1145 am           Consent to Services:  Patient was given information about care management services, agreed to services, and gave verbal consent to participate.   Plan: Telephone follow up  appointment with care management team member scheduled for: 01-29-2023 at 1145 am  Alto Denver RN, MSN, CCM RN Care Manager  [facility]  Ambulatory Care Management  Direct Number: [phone]

## 2022-11-20 NOTE — Patient Instructions (Signed)
Visit Information  Thank you for taking time to visit with me today. Please don't hesitate to contact me if I can be of assistance to you before our next scheduled telephone appointment.  Following are the goals we discussed today:   Goals Addressed             This Visit's Progress    RNCM Care Management  Expected Outcome:  Monitor, Self-Manage and Reduce Symptoms of: Anxiety       Current Barriers:  Care Coordination needs related to social worker support, social work referral pending  in a patient with anxiety Chronic Disease Management support and education needs related to effective management of anxiety  Planned Interventions: Evaluation of current treatment plan related to anxiety and patient's adherence to plan as established by provider. The patient has good days and bad days. Works with the [facility] specialist on a regular basis. He likes to listen to worship music when he is anxious and sometimes eating helps also. He use to do a lot of writing but cannot do that as well due to his PD. He states that his faith keeps him strong and he got a little emotional on the phone. The patient overall is doing fairly well. He has seen the [facility] specialist since last outreach and is doing good with the management of his anxiety level. He has been extra busy lately with vestibular therapy but is doing good. He does not sleep good sometimes, naps during the day. The patients wife is very good support. Reflective listening and support given.  Provided education to patient re: call the office for changes in mood, anxiety, depression, or mental health needs  Reviewed medications with patient and discussed compliance. The patient is compliant with medications. Takes as prescribed Social Work referral for ongoing support and education for anxiety and mental health needs. Ongoing support and education given from the LCSW.  Discussed plans with patient for ongoing care management follow up and provided patient  with direct contact information for care management team Advised patient to discuss changes in his mood, anxiety, depression, or mental health  with provider Screening for signs and symptoms of depression related to chronic disease state  Assessed social determinant of health barriers  Symptom Management: Take medications as prescribed   Attend all scheduled provider appointments Call provider office for new concerns or questions  call the Suicide and Crisis Lifeline: 988 call the [place] National Suicide Prevention Lifeline: 442-815-9468 or TTY: (303) 806-8379 TTY 207-690-9197) to talk to a trained counselor call 1-800-273-TALK (toll free, 24 hour hotline) if experiencing a Mental Health or Behavioral Health Crisis   Follow Up Plan: Telephone follow up appointment with care management team member scheduled for: 01-29-2023 at 1145 am       RNCM Care Management Expected Outcome:  Monitor, Self-Manage and Reduce Symptoms of: Arthritis Spine, Back Pain       Current Barriers:  Care Coordination needs related to support and education for effective pain management  in a patient with Chronic pain and arthritis  Chronic Disease Management support and education needs related to effective management of chronic pain, arthritis, and scoliosis   Planned Interventions: Reviewed provider established plan for pain management. Currently the patient is stable with his pain management. He goes to the pain clinic but also is followed closely by pcp. The patients wife knows to call for acute changes. Had a procedure recently on his back and has adequate pain medications. He has had 30 to 40% relief from  back pain with his procedures. The patient had his procedure and had some relief from it. Is currently doing Vestibular PT and after this is completed he will go back to doing regular PT. He is walking in the grocery store and seldom has to use his chair but his walker. Discussed exercises he can do while sitting in  a chair or stretches he can do also. Education on pacing activity and being careful. Discussed the cubii device to use to help with leg and muscle strength. His daughter also is working with him on leg strengthening.  Has worked with PT/OT before. Has good support from his wife and family. He continues to stay mobile and active. Has not been able to get out a lot this week due to the rainy weather but his wife is planning an outing today.  Discussed importance of adherence to all scheduled medical appointments. Keeps appointments, next appointment with the pcp on 01-25-2023 at 11 am; Counseled on the importance of reporting any/all new or changed pain symptoms or management strategies to pain management provider; Advised patient to report to care team affect of pain on daily activities; Discussed use of relaxation techniques and/or diversional activities to assist with pain reduction (distraction, imagery, relaxation, massage, acupressure, TENS, heat, and cold application; Reviewed with patient prescribed pharmacological and nonpharmacological pain relief strategies; Advised patient to discuss changes in level or intensity of pain, unresolved pain, questions and concerns with provider; Screening for signs and symptoms of depression related to chronic disease state;  Assessed social determinant of health barriers;  Review of fall prevention and safety concerns  Symptom Management: Take medications as prescribed   Attend all scheduled provider appointments Call provider office for new concerns or questions  call the Suicide and Crisis Lifeline: 988 call the [place] National Suicide Prevention Lifeline: 747-491-8235 or TTY: (619) 319-1108 TTY (845)346-6577) to talk to a trained counselor call 1-800-273-TALK (toll free, 24 hour hotline) if experiencing a Mental Health or Behavioral Health Crisis   Follow Up Plan: Telephone follow up appointment with care management team member scheduled for: 01-29-2023  at 1145 am       RNCM Care Management Expected Outcome:  Monitor, Self-Manage and Reduce Symptoms of: Parkinson's       Current Barriers:  Knowledge Deficits related to resources in the area for support in meeting the needs of the patient as his Parkinson's advances and needs change  Care Coordination needs related to support from the CCM team, education needs, and ongoing support for effective management of Parkinson's and other chronic conditions in a patient with Parkinson's Disease  Chronic Disease Management support and education needs related to effective management of PD  Planned Interventions: Evaluation of current treatment plan related to PD and patient's adherence to plan as established by provider. The patient is doing well at this time. 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 His wife states that he is benefiting from the Vestibular PT. The patients wife states that he also has a new bed that was given to them by [facility] to use and this has been a blessing for them.  Advised patient to call the [facility] after talking to the insurance and discussing with her husband if they want the pcp to order the hospital bed. His wife did check on some beds but states that there are so many different ones to choose from. The patient now has a bariatric bed to use in his home. The wife received a call  from the [place] at [employer] and they were abel to deliver a bed to their home to use for the patient. It is a bariatric bed so it has a lot of room and it fits the patient well. The patient will be able to use the bed for as long as he needs to do so. The wife and the patient are thankful for this. Will let the pcp know that an order for a hospital bed is not needed at this time since the patients needs are currently being met.  Provided education to patient re: the CCM team, resources available, support for the patient, information about myChart: The patients wife wishes for the link  to be sent to her email at [email or web address]. This has been sent today. The patients wife also advised the need to provide a DPR for the patient for the future of speaking to the team. The patient gave permission for the [facility] to speak to his wife Joseph Arroyo this call and any subsequent calls.  Reviewed medications with patient and discussed compliance. The patient currently does not have a lot of medications and his wife states she is handling them well. Review of pharm D support if needed.  Collaborated with pcp and LCSW for current expressed needs  regarding PD and advancement of PD and possible future needs. Has spoke with the LCSW and knows how to reach for new questions or concerns Provided patient with PD, falls and safety, and community resource educational materials related to resources to help with effective management of PD. Has had 4 falls over the last few months but none in a month. The patient is working with PT and doing vestibular PT at this time. The patient will go back to doing regular PT after the Vestibular PT is completed.  Social Work referral for education, support, and long term planning for the patient needs with the advancement of his PD Discussed plans with patient for ongoing care management follow up and provided patient with direct contact information for care management team Advised patient to discuss changes in PD, questions or concerns with provider Screening for signs and symptoms of depression related to chronic disease state  Assessed social determinant of health barriers Encouraged the patients wife to get a notebook dedicated to the patients health needs and write down questions as she or the patient thinks of questions in regard to DME needs, changes in conditions, medication questions, and other information important to meeting the needs of the patient.  The patient currently receives meals from [employer], has a tub bench and rails in the bathroom, frame  for over the commode, and a ramp. The wife is going to call insurance about a hospital bed and let the RNCM know the desires of the patient.  Other resource that may be helpful for the patient is [facility] in [place] at [phone]. Gave the patients wife the number to the [employer] in [place], [place] to check out for a lift chair or hospital bed with all the functions the patient may need. She may also view the webpage on Facebook: [employer]. Hospital bed not needed but did review of other equipment available if needed. Will continue to monitor for changes or new needs. The patient is losing weight. He is drinking protein drinks but continues to lose weight. The recommended diet for patients with PD is plant based; however the patients wife was asking about a carnivore diet that she heard about a lady reversing  her sx and sx of PD. Will do some research and see what can be found out about this. Will let the wife know if there are new information found out and will email her with any pertinent information.   Symptom Management: Take medications as prescribed   Attend all scheduled provider appointments Call provider office for new concerns or questions  call the Suicide and Crisis Lifeline: 988 call the [place] National Suicide Prevention Lifeline: 820-755-3801 or TTY: 226-557-0993 TTY 340 036 5452) to talk to a trained counselor call 1-800-273-TALK (toll free, 24 hour hotline) if experiencing a Mental Health or Behavioral Health Crisis   Follow Up Plan: Telephone follow up appointment with care management team member scheduled for: 01-29-2023 at 1145 am           Our next appointment is by telephone on 01-29-2023 at 1145 am  Please call the care guide team at [phone] if you need to cancel or reschedule your appointment.   If you are experiencing a Mental Health or Behavioral Health Crisis or need someone to talk to, please call the Suicide and Crisis Lifeline: 988 call  the [place] National Suicide Prevention Lifeline: 817-676-4855 or TTY: 563-249-4091 TTY 318-588-5965) to talk to a trained counselor call 1-800-273-TALK (toll free, 24 hour hotline)   Patient verbalizes understanding of instructions and care plan provided today and agrees to view in MyChart. Active MyChart status and patient understanding of how to access instructions and care plan via MyChart confirmed with patient.     Telephone follow up appointment with care management team member scheduled for: 01-29-2023 at 1145 am  Alto Denver RN, MSN, CCM RN Care Manager  [facility]  Ambulatory Care Management  Direct Number: [phone]

## 2022-11-23 ENCOUNTER — Ambulatory Visit: Payer: Medicare HMO

## 2022-11-23 DIAGNOSIS — H353131 Nonexudative age-related macular degeneration, bilateral, early dry stage: Secondary | ICD-10-CM | POA: Diagnosis not present

## 2022-11-23 DIAGNOSIS — Z961 Presence of intraocular lens: Secondary | ICD-10-CM | POA: Diagnosis not present

## 2022-11-23 DIAGNOSIS — H04123 Dry eye syndrome of bilateral lacrimal glands: Secondary | ICD-10-CM | POA: Diagnosis not present

## 2022-11-23 DIAGNOSIS — H532 Diplopia: Secondary | ICD-10-CM | POA: Diagnosis not present

## 2022-11-26 ENCOUNTER — Ambulatory Visit: Payer: Medicare HMO

## 2022-11-26 DIAGNOSIS — M6281 Muscle weakness (generalized): Secondary | ICD-10-CM | POA: Diagnosis not present

## 2022-11-26 DIAGNOSIS — R278 Other lack of coordination: Secondary | ICD-10-CM | POA: Diagnosis not present

## 2022-11-26 DIAGNOSIS — R2681 Unsteadiness on feet: Secondary | ICD-10-CM | POA: Diagnosis not present

## 2022-11-26 DIAGNOSIS — R262 Difficulty in walking, not elsewhere classified: Secondary | ICD-10-CM | POA: Diagnosis not present

## 2022-11-26 DIAGNOSIS — R42 Dizziness and giddiness: Secondary | ICD-10-CM

## 2022-11-26 NOTE — Therapy (Signed)
OUTPATIENT PHYSICAL THERAPY VESTIBULAR TREATMENT      Patient Name: Joseph Arroyo MRN: [id number] DOB:09/06/1940, 82 y.o., male Today's Date: 11/26/2022  END OF SESSION:  PT End of Session - 11/26/22 1454     Visit Number 8    Number of Visits 25    Date for PT Re-Evaluation 01/11/23    Authorization Type [employer]    Progress Note Due on Visit 10    PT Start Time 1404    PT Stop Time 1448    PT Time Calculation (min) 44 min    Equipment Utilized During Treatment Gait belt    Activity Tolerance Patient tolerated treatment well;No increased pain    Behavior During Therapy [facility] for tasks assessed/performed                Past Medical History:  Diagnosis Date   Allergic rhinitis due to pollen 11/21/2007   Brachial neuritis or radiculitis NOS    Cervicalgia    Concussion    age 34 - s/p accident   Costal chondritis    Depression    Essential and other specified forms of tremor    GERD (gastroesophageal reflux disease)    in past   Lumbago    Occlusion and stenosis of carotid artery without mention of cerebral infarction    Seizures [facility])    age 2 - after concussion   Past Surgical History:  Procedure Laterality Date   CATARACT EXTRACTION Right 07/18/14   CATARACT EXTRACTION W/PHACO Left 08/01/2014   Procedure: CATARACT EXTRACTION PHACO AND INTRAOCULAR LENS PLACEMENT (IOC);  Surgeon: Lockie Mola, MD;  Location: [facility];  Service: Ophthalmology;  Laterality: Left;  IVA TOPICAL   COLONOSCOPY  2008   OTHER SURGICAL HISTORY  2002   ear surgery   STAPEDES SURGERY Right 2002   Patient Active Problem List   Diagnosis Date Noted   Dizziness 10/02/2022   Lumbar facet joint pain 06/25/2022   Osteoarthritis of facet joint of lumbar spine 06/25/2022   Lumbosacral facet joint hypertrophy 06/25/2022   Parkinson's disease with dyskinesia 01/27/2022   Dysphagia, pharyngeal phase 09/17/2021   Cervicalgia 08/05/2021   Spondylosis without  myelopathy or radiculopathy, lumbosacral region 06/24/2021   Lumbar central spinal stenosis, w/o neurogenic claudication (L4-5) 06/03/2021   Lumbar lateral recess stenosis (Bilateral: L2-3, L4-5) (Right: L3-4) 06/03/2021   Lumbosacral foraminal stenosis (Bilateral: L2-3, L3-4) (Left: L5-S1) 06/03/2021   Lumbar nerve root impingement (Right: L3 at L2-3 & L3-4) 06/03/2021   Ligamentum flavum hypertrophy (L3-4, L4-5) 06/03/2021   Abnormal MRI, lumbar spine (04/23/2021) 04/24/2021   Long term prescription benzodiazepine use (alprazolam) (Xanax) 03/24/2021   Grade 1 Retrolisthesis of L2/L3 (5 mm) and L3/L4 (3 mm) 03/24/2021   Levoscoliosis of lumbar spine (L3-4 apex) 03/24/2021   DDD (degenerative disc disease), lumbosacral 03/24/2021   Lumbosacral facet arthropathy (Left: L3-4, L4-5, and L5-S1) 03/24/2021   Tricompartment osteoarthritis of knee (Left) 03/24/2021   Baker cyst (Left) 03/24/2021   Chronic low back pain (1ry area of Pain) (Bilateral) (R>L) w/o sciatica 03/24/2021   Lumbar facet syndrome (Bilateral) 03/24/2021   Chronic lower extremity pain (2ry area of Pain) (Bilateral) (L>R) 03/24/2021   Lumbosacral radiculitis/sensory radiculopathy at L2 (Bilateral) 03/24/2021   Lumbosacral radiculitis/sensory radiculopathy at L3 (Bilateral) 03/24/2021   Chronic pain syndrome 03/23/2021   Pharmacologic therapy 03/23/2021   Disorder of skeletal system 03/23/2021   Problems influencing health status 03/23/2021   PAD (peripheral artery disease) (HCC) 10/01/2020   GAD (generalized anxiety  disorder) 01/13/2018   MDD (major depressive disorder) 01/13/2018   Umbilical hernia 09/04/2017   Chronic knee pain (Left) 08/11/2017   Derangement of medial meniscus, posterior horn (Left) 08/11/2017   Vitamin D insufficiency 03/05/2016   BPH without obstruction/lower urinary tract symptoms 07/08/2015   Chronic fatigue 10/23/2014   Major depression, recurrent, full remission (HCC) 07/26/2013   Unsteady gait  07/26/2013   Orthostatic hypotension 07/26/2013   Depression 07/26/2013   Anxiety 06/23/2013   Chronic anxiety 06/23/2013   Tremor 05/18/2013   Bilateral carotid artery stenosis 08/30/2012    PCP: Smitty Cords, DO  REFERRING PROVIDER:   Mecum, Oswaldo Conroy, PA-C    REFERRING DIAG: R42 (ICD-10-CM) - Dizziness   THERAPY DIAG:  Dizziness and giddiness  Other lack of coordination  Difficulty in walking, not elsewhere classified  ONSET DATE: August 1st 2024  Rationale for Evaluation and Treatment: Rehabilitation  SUBJECTIVE:   SUBJECTIVE STATEMENT Pt's spouse reports pt saw doctor and determined double vision not neurological and due to dry eye. Pt now on eye drops schedule. He still reports some difficulty with laying down, and sees objects "moving away" from him. This intensifies the faster he lays down. Doesn't last long, reports only a couple of minutes. He thinks this is different than the symptoms/dizziness that brought him to therapy.   Pt accompanied by: significant other   PERTINENT HISTORY:  The pt is a pleasant 82 yo male referred to PT for dizziness. Pt known to clinic, previously seen for falls in the setting of PD.  Pt spouse present for eval and reports pt was seen by neurologist recently at [facility] following onset of dizziness about two-three weeks ago. He reports they think he has BPPV.  PT spouse reports he had blood work done at this visit which "didn't show anything" and was normal. Pt unsure if his symptoms come on when he is still at rest. He describes his symptoms as spinning. This can be triggered when he lies on his back and also when he sits up from supine. Spinning lasts for minutes. He reports no nausea.  Pt sometimes sees double when he looks to the side and this has been going on for weeks. Denies any one sided weakness or other changes. He has fallen 4 times in past 3 months. Pt spouse reports pt had a fall about a week prior to dizziness onset. Pt  reports no neck pain, has hx of LBP. His spouse reports decline in his mobility since this started. Pt uses RW at baseline for ambulation. Pt PMH significant for Parkinson's disease, concussion, cervicalgia, depression, seizures, chronic L knee pain, chronic anxiety, DDD, PAD, chronic pain syndrome, chronic low back pain, please refer to chart for full, extensive, PMH. PAIN:  Are you having pain? No Back pain hurting prior, took 3 ibuprophen.   PRECAUTIONS: Fall   WEIGHT BEARING RESTRICTIONS: No  FALLS: Has patient fallen in last 6 months? Yes. Number of falls 4  LIVING ENVIRONMENT: via chart and confirmed by pt in session Lives with: lives with their spouse Lives in: House/apartment Stairs: 4 Has following equipment at home: Walker - 2 wheeled  PLOF:  needs assistance, pt with PD that affected his mobility/ADLs  PATIENT GOALS: get rid of dizziness  OBJECTIVE:    VESTIBULAR TREATMENT:  DATE: 11/26/22  CRM: Roll testing: repeated 2x each side: pt with negative testing bilaterally. He reports feeling as if he is "almost" experiencing symptoms when initially lying down  NMR: -hot pack applied to low back while seated in chair (pt and spouse report nothing currently applied to low back that would prevent safe use of heat) Seated VORx1 horizontal head turns 2x60 sec Seated VORx1 vertical head turns 2x60 sec Comments: no dizziness with interventions  Habituation: on large mat table  Rolling L and R sides using log roll technique 2x each way, cuing throughout. No pain with intervention. Pt reports some dizziness only 1x upon sitting up, and one instance of feeling as if he could become symptomatic. Pt additionally required cuing for bridging/scooting technique to get in positioning for intervention.   Seated- EC x 30 sec EC horizontal head turns x 30 sec EC vertical head turns x 30 sec    PATIENT EDUCATION: Education details: assessment findings, exercise technique  HOME EXERCISE PROGRAM: to be updated with new orders   GOALS:   Goals reviewed with patient? Yes   SHORT TERM GOALS: Target date: 01/07/2023   Patient will be independent in home exercise program to improve dizziness, balance and mobility for better safety and functional independence with ADLs. Baseline: to be initiated future visit/as needed Goal status: INITIAL   LONG TERM GOALS: Target date: 02/18/2023    Patient will increase FOTO score to equal to or greater than 53    to demonstrate statistically significant improvement in mobility and quality of life.  Baseline: 44 Goal status: INITIAL  2.  Patient will reduce dizziness handicap inventory score to <50, for less dizziness with ADLs and increased safety with home and work tasks.  Baseline: deferred, will complete as pt able Goal status: INITIAL  3.  The pt will report at least a 60% decrease in positional or movement triggered dizziness symptoms in order to indicate decreased fall risk and increased balance/safety with mobility.  Baseline: pt presents with decreased mobility due to dizziness, dizziness/vertigo triggered by particular positional; changes Goal status: INITIAL  4.  Patient will reduce timed up and go to <11 seconds to reduce fall risk and demonstrate improved transfer/gait ability.  Baseline: 9/5: 18 sec  Goal status: INITIAL   ASSESSMENT:  CLINICAL IMPRESSION: Roll testing repeated and found to be negative bilaterally. Pt only mildly symptomatic a couple times during visit with habituation intervention, but no vertigo. PT and pt discussed that pt now would be more appropriate for transitioning to interventions to target PD impairments. HEP not assigned as caregiver would need training to assist with pt safely performing rolling habituation technique at home. Future HEP to be determined based on assessment of PD impairments. Pt  and spouse verbalize understanding to bring new referral next visit. The pt will benefit from further skilled PT to address these deficits in order to decrease fall risk, dizziness and improve balance/QOL.     OBJECTIVE IMPAIRMENTS: Abnormal gait, decreased activity tolerance, decreased balance, decreased coordination, decreased mobility, difficulty walking, decreased strength, dizziness, impaired vision/preception, improper body mechanics, and postural dysfunction.   ACTIVITY LIMITATIONS: lifting, bending, squatting, stairs, transfers, bed mobility, toileting, dressing, hygiene/grooming, and locomotion level  PARTICIPATION LIMITATIONS: meal prep, cleaning, laundry, shopping, community activity, and yard work  PERSONAL FACTORS: Age, Fitness, and 3+ comorbidities: Pt PMH significant for Parkinson's disease, concussion, cervicalgia, depression, seizures, chronic L knee pain, chronic anxiety, DDD, PAD, chronic pain syndrome, chronic low back pain, please refer to chart for  full, extensive, PMH.  are also affecting patient's functional outcome.   REHAB POTENTIAL: Good  CLINICAL DECISION MAKING: Evolving/moderate complexity  EVALUATION COMPLEXITY: Moderate   PLAN:  PT FREQUENCY: 1-2x/week  PT DURATION: 12 weeks  PLANNED INTERVENTIONS: Therapeutic exercises, Therapeutic activity, Neuromuscular re-education, Balance training, Gait training, Patient/Family education, Self Care, Joint mobilization, Stair training, Vestibular training, Canalith repositioning, Visual/preceptual remediation/compensation, DME instructions, Electrical stimulation, Wheelchair mobility training, Spinal mobilization, Cryotherapy, Moist heat, Splintting, Taping, Traction, Manual therapy, and Re-evaluation  PLAN FOR NEXT SESSION: further testing as pt able, balance, habituation interventions, assessment of PD related impairments    Baird Kay, PT 11/26/2022, 3:07 PM  3:07 PM, 11/26/22   3:07 PM,  11/26/22  Physical Therapist - [facility]  [phone] [facility])

## 2022-12-01 ENCOUNTER — Ambulatory Visit: Payer: Medicare HMO | Attending: Physician Assistant

## 2022-12-01 DIAGNOSIS — G20C Parkinsonism, unspecified: Secondary | ICD-10-CM | POA: Insufficient documentation

## 2022-12-01 DIAGNOSIS — M6281 Muscle weakness (generalized): Secondary | ICD-10-CM | POA: Diagnosis not present

## 2022-12-01 DIAGNOSIS — R278 Other lack of coordination: Secondary | ICD-10-CM | POA: Insufficient documentation

## 2022-12-01 DIAGNOSIS — M5459 Other low back pain: Secondary | ICD-10-CM | POA: Diagnosis not present

## 2022-12-01 DIAGNOSIS — R262 Difficulty in walking, not elsewhere classified: Secondary | ICD-10-CM | POA: Diagnosis not present

## 2022-12-01 DIAGNOSIS — R2681 Unsteadiness on feet: Secondary | ICD-10-CM | POA: Diagnosis not present

## 2022-12-01 DIAGNOSIS — R2689 Other abnormalities of gait and mobility: Secondary | ICD-10-CM | POA: Insufficient documentation

## 2022-12-01 DIAGNOSIS — R42 Dizziness and giddiness: Secondary | ICD-10-CM | POA: Insufficient documentation

## 2022-12-01 DIAGNOSIS — R269 Unspecified abnormalities of gait and mobility: Secondary | ICD-10-CM | POA: Diagnosis not present

## 2022-12-01 NOTE — Therapy (Signed)
OUTPATIENT PHYSICAL THERAPY VESTIBULAR TREATMENT/RECERT     Patient Name: Joseph Arroyo MRN: [id number] DOB:1940-07-28, 82 y.o., male Today's Date: 12/02/2022  END OF SESSION:  PT End of Session - 12/02/22 0822     Visit Number 9    Number of Visits 33    Date for PT Re-Evaluation 02/23/23    Authorization Type [employer] Medicare    Progress Note Due on Visit 10    PT Start Time 1150    PT Stop Time 1231    PT Time Calculation (min) 41 min    Equipment Utilized During Treatment Gait belt    Activity Tolerance Patient tolerated treatment well    Behavior During Therapy WFL for tasks assessed/performed                 Past Medical History:  Diagnosis Date   Allergic rhinitis due to pollen 11/21/2007   Brachial neuritis or radiculitis NOS    Cervicalgia    Concussion    age 71 - s/p accident   Costal chondritis    Depression    Essential and other specified forms of tremor    GERD (gastroesophageal reflux disease)    in past   Lumbago    Occlusion and stenosis of carotid artery without mention of cerebral infarction    Seizures (HCC)    age 69 - after concussion   Past Surgical History:  Procedure Laterality Date   CATARACT EXTRACTION Right 07/18/14   CATARACT EXTRACTION W/PHACO Left 08/01/2014   Procedure: CATARACT EXTRACTION PHACO AND INTRAOCULAR LENS PLACEMENT (IOC);  Surgeon: Lockie Mola, MD;  Location: [facility];  Service: Ophthalmology;  Laterality: Left;  IVA TOPICAL   COLONOSCOPY  2008   OTHER SURGICAL HISTORY  2002   ear surgery   STAPEDES SURGERY Right 2002   Patient Active Problem List   Diagnosis Date Noted   Dizziness 10/02/2022   Lumbar facet joint pain 06/25/2022   Osteoarthritis of facet joint of lumbar spine 06/25/2022   Lumbosacral facet joint hypertrophy 06/25/2022   Parkinson's disease with dyskinesia (HCC) 01/27/2022   Dysphagia, pharyngeal phase 09/17/2021   Cervicalgia 08/05/2021   Spondylosis without  myelopathy or radiculopathy, lumbosacral region 06/24/2021   Lumbar central spinal stenosis, w/o neurogenic claudication (L4-5) 06/03/2021   Lumbar lateral recess stenosis (Bilateral: L2-3, L4-5) (Right: L3-4) 06/03/2021   Lumbosacral foraminal stenosis (Bilateral: L2-3, L3-4) (Left: L5-S1) 06/03/2021   Lumbar nerve root impingement (Right: L3 at L2-3 & L3-4) 06/03/2021   Ligamentum flavum hypertrophy (L3-4, L4-5) 06/03/2021   Abnormal MRI, lumbar spine (04/23/2021) 04/24/2021   Long term prescription benzodiazepine use (alprazolam) (Xanax) 03/24/2021   Grade 1 Retrolisthesis of L2/L3 (5 mm) and L3/L4 (3 mm) 03/24/2021   Levoscoliosis of lumbar spine (L3-4 apex) 03/24/2021   DDD (degenerative disc disease), lumbosacral 03/24/2021   Lumbosacral facet arthropathy (Left: L3-4, L4-5, and L5-S1) 03/24/2021   Tricompartment osteoarthritis of knee (Left) 03/24/2021   Baker cyst (Left) 03/24/2021   Chronic low back pain (1ry area of Pain) (Bilateral) (R>L) w/o sciatica 03/24/2021   Lumbar facet syndrome (Bilateral) 03/24/2021   Chronic lower extremity pain (2ry area of Pain) (Bilateral) (L>R) 03/24/2021   Lumbosacral radiculitis/sensory radiculopathy at L2 (Bilateral) 03/24/2021   Lumbosacral radiculitis/sensory radiculopathy at L3 (Bilateral) 03/24/2021   Chronic pain syndrome 03/23/2021   Pharmacologic therapy 03/23/2021   Disorder of skeletal system 03/23/2021   Problems influencing health status 03/23/2021   PAD (peripheral artery disease) (HCC) 10/01/2020   GAD (generalized anxiety disorder)  01/13/2018   MDD (major depressive disorder) 01/13/2018   Umbilical hernia 09/04/2017   Chronic knee pain (Left) 08/11/2017   Derangement of medial meniscus, posterior horn (Left) 08/11/2017   Vitamin D insufficiency 03/05/2016   BPH without obstruction/lower urinary tract symptoms 07/08/2015   Chronic fatigue 10/23/2014   Major depression, recurrent, full remission (HCC) 07/26/2013   Unsteady gait  07/26/2013   Orthostatic hypotension 07/26/2013   Depression 07/26/2013   Anxiety 06/23/2013   Chronic anxiety 06/23/2013   Tremor 05/18/2013   Bilateral carotid artery stenosis 08/30/2012    PCP: Smitty Cords, DO  REFERRING PROVIDER:   Mecum, Oswaldo Conroy, PA-C    REFERRING DIAG: R42 (ICD-10-CM) - Dizziness   THERAPY DIAG:  Dizziness and giddiness  Difficulty in walking, not elsewhere classified  Muscle weakness (generalized)  Unsteadiness on feet  Other low back pain  ONSET DATE: August 1st 2024  Rationale for Evaluation and Treatment: Rehabilitation  SUBJECTIVE:   SUBJECTIVE STATEMENT Pt reports the faster he lays down dizzier he feels. Continues to see room moving away/toward him. Occurs every time, unless he lays down slowly. Reports dizziness is moderate in intensity.   Pt accompanied by: significant other   PERTINENT HISTORY:  The pt is a pleasant 82 yo male referred to PT for dizziness. Pt known to clinic, previously seen for falls in the setting of PD.  Pt spouse present for eval and reports pt was seen by neurologist recently at [facility] following onset of dizziness about two-three weeks ago. He reports they think he has BPPV.  PT spouse reports he had blood work done at this visit which "didn't show anything" and was normal. Pt unsure if his symptoms come on when he is still at rest. He describes his symptoms as spinning. This can be triggered when he lies on his back and also when he sits up from supine. Spinning lasts for minutes. He reports no nausea.  Pt sometimes sees double when he looks to the side and this has been going on for weeks. Denies any one sided weakness or other changes. He has fallen 4 times in past 3 months. Pt spouse reports pt had a fall about a week prior to dizziness onset. Pt reports no neck pain, has hx of LBP. His spouse reports decline in his mobility since this started. Pt uses RW at baseline for ambulation. Pt PMH significant for  Parkinson's disease, concussion, cervicalgia, depression, seizures, chronic L knee pain, chronic anxiety, DDD, PAD, chronic pain syndrome, chronic low back pain, please refer to chart for full, extensive, PMH. PAIN:  Are you having pain? No Back pain hurting prior, took 3 ibuprophen.   PRECAUTIONS: Fall   WEIGHT BEARING RESTRICTIONS: No  FALLS: Has patient fallen in last 6 months? Yes. Number of falls 4  LIVING ENVIRONMENT: via chart and confirmed by pt in session Lives with: lives with their spouse Lives in: House/apartment Stairs: 4 Has following equipment at home: Walker - 2 wheeled  PLOF:  needs assistance, pt with PD that affected his mobility/ADLs  PATIENT GOALS: get rid of dizziness  OBJECTIVE:    VESTIBULAR TREATMENT:  DATE: 12/02/22  TA: MMT: generally 4/5 BLE 5xSTS: 26 seconds - back discomfort limits testing, use of BUE   Timed rolling (bed mobility): deferred to future visit   : 0.6 m/s with 2WW    Initiated HEP and reviewed sets and reps with pt during visit: Access Code: [facility] URL: [email or web address] Date: 12/01/2022 Prepared by: Temple Pacini  Exercises - Seated March  - 1 x daily - 5-6 x weekly - 4 sets - 10 reps   CRM: Dix-Hallpike testing: R: positive, brief <15 sec nystagmus unclear if up or downbeat, torsional component present Epley R 1x Repeat R DH: negative L: slight dizziness reported but no nystagmus observed Epley L 1x      PATIENT EDUCATION: Education details: assessment findings, exercise technique, HEP, plan  HOME EXERCISE PROGRAM: Access Code: [email or web address] URL: [email or web address] Date: 12/01/2022 Prepared by: Temple Pacini  Exercises - Seated March  - 1 x daily - 5-6 x weekly - 4 sets - 10 reps  GOALS:   Goals reviewed with patient? Yes   SHORT TERM GOALS: Target date: 01/13/2023    Patient will be independent in home exercise program to improve dizziness, balance and mobility for better safety and functional independence with ADLs. Baseline: to be initiated future visit/as needed Goal status: INITIAL   LONG TERM GOALS: Target date: 02/24/2023    Patient will increase FOTO score to equal to or greater than 53    to demonstrate statistically significant improvement in mobility and quality of life.  Baseline: 44 Goal status: INITIAL  2.  Patient will reduce dizziness handicap inventory score to <50, for less dizziness with ADLs and increased safety with home and work tasks.  Baseline: deferred, will complete as pt able Goal status: INITIAL  3.  The pt will report at least a 60% decrease in positional or movement triggered dizziness symptoms in order to indicate decreased fall risk and increased balance/safety with mobility.  Baseline: pt presents with decreased mobility due to dizziness, dizziness/vertigo triggered by particular positional; changes Goal status: INITIAL  4.  Patient will reduce timed up and go to <11 seconds to reduce fall risk and demonstrate improved transfer/gait ability.  Baseline: 9/5: 18 sec  Goal status: INITIAL  5.Patient (> 42 years old) will complete five times sit to stand test in < 15 seconds indicating an increased LE strength and improved balance. Baseline: 12/01/22: 26 sec Goal status: NEW  6. Patient will increase 10 meter walk test to >1.73m/s as to improve gait speed for better community ambulation and to reduce fall risk. Baseline: 12/01/22: 0.6 m/s with RW Goal status: NEW   ASSESSMENT:  CLINICAL IMPRESSION: PT repeated DH testing per pt request, pt continues to exhibit positive R DH that does not reappear following Epley and with reassessment. This suggests nystagmus is either from central origin or pt with persistent, maneuver-resistant BPPV. Plan to continue habituation interventions for supine<>seated. Performed recert  to initiate assessment for PD related impairments. Pt found to have impaired LE strength/power AEB 5xSTS and decreased gait speed AEB . PT initiated HEP, but plan to progress and add more exercise next few visits. Patient's condition has the potential to improve in response to therapy. Maximum improvement is yet to be obtained. The anticipated improvement is attainable and reasonable in a generally predictable time.   Pt and spouse verbalize understanding to bring new referral next visit. The pt will benefit from further skilled PT to address these deficits in order to decrease fall risk, dizziness and improve  balance/QOL.     OBJECTIVE IMPAIRMENTS: Abnormal gait, decreased activity tolerance, decreased balance, decreased coordination, decreased mobility, difficulty walking, decreased strength, dizziness, impaired vision/preception, improper body mechanics, and postural dysfunction.   ACTIVITY LIMITATIONS: lifting, bending, squatting, stairs, transfers, bed mobility, toileting, dressing, hygiene/grooming, and locomotion level  PARTICIPATION LIMITATIONS: meal prep, cleaning, laundry, shopping, community activity, and yard work  PERSONAL FACTORS: Age, Fitness, and 3+ comorbidities: Pt PMH significant for Parkinson's disease, concussion, cervicalgia, depression, seizures, chronic L knee pain, chronic anxiety, DDD, PAD, chronic pain syndrome, chronic low back pain, please refer to chart for full, extensive, PMH.  are also affecting patient's functional outcome.   REHAB POTENTIAL: Good  CLINICAL DECISION MAKING: Evolving/moderate complexity  EVALUATION COMPLEXITY: Moderate   PLAN:  PT FREQUENCY: 1-2x/week  PT DURATION: 12 weeks  PLANNED INTERVENTIONS: Therapeutic exercises, Therapeutic activity, Neuromuscular re-education, Balance training, Gait training, Patient/Family education, Self Care, Joint mobilization, Stair training, Vestibular training, Canalith repositioning, Visual/preceptual  remediation/compensation, DME instructions, Electrical stimulation, Wheelchair mobility training, Spinal mobilization, Cryotherapy, Moist heat, Splintting, Taping, Traction, Manual therapy, and Re-evaluation  PLAN FOR NEXT SESSION: further testing as pt able, balance, habituation interventions, assessment of PD related impairments    Baird Kay, PT 12/02/2022, 8:33 AM  8:33 AM, 12/02/22   8:33 AM, 12/02/22  Physical Therapist - [facility]  [phone] [facility])

## 2022-12-05 ENCOUNTER — Other Ambulatory Visit: Payer: Self-pay | Admitting: Psychiatry

## 2022-12-05 DIAGNOSIS — F411 Generalized anxiety disorder: Secondary | ICD-10-CM

## 2022-12-05 DIAGNOSIS — F3342 Major depressive disorder, recurrent, in full remission: Secondary | ICD-10-CM

## 2022-12-08 ENCOUNTER — Ambulatory Visit: Payer: Medicare HMO

## 2022-12-08 DIAGNOSIS — R2681 Unsteadiness on feet: Secondary | ICD-10-CM

## 2022-12-08 DIAGNOSIS — M5459 Other low back pain: Secondary | ICD-10-CM | POA: Diagnosis not present

## 2022-12-08 DIAGNOSIS — R42 Dizziness and giddiness: Secondary | ICD-10-CM | POA: Diagnosis not present

## 2022-12-08 DIAGNOSIS — M6281 Muscle weakness (generalized): Secondary | ICD-10-CM | POA: Diagnosis not present

## 2022-12-08 DIAGNOSIS — R269 Unspecified abnormalities of gait and mobility: Secondary | ICD-10-CM | POA: Diagnosis not present

## 2022-12-08 DIAGNOSIS — R2689 Other abnormalities of gait and mobility: Secondary | ICD-10-CM | POA: Diagnosis not present

## 2022-12-08 DIAGNOSIS — R278 Other lack of coordination: Secondary | ICD-10-CM

## 2022-12-08 DIAGNOSIS — G20C Parkinsonism, unspecified: Secondary | ICD-10-CM | POA: Diagnosis not present

## 2022-12-08 DIAGNOSIS — R262 Difficulty in walking, not elsewhere classified: Secondary | ICD-10-CM | POA: Diagnosis not present

## 2022-12-08 NOTE — Therapy (Signed)
OUTPATIENT PHYSICAL THERAPY NEURO TREATMENT/Physical Therapy Progress Note   Dates of reporting period  10/19/2022   to   12/08/2022      Patient Name: Joseph Arroyo MRN: [id number] DOB:1940/10/22, 82 y.o., male Today's Date: 12/08/2022  END OF SESSION:  PT End of Session - 12/08/22 1334     Visit Number 10    Number of Visits 33    Date for PT Re-Evaluation 02/23/23    Authorization Type [employer]    Progress Note Due on Visit 10    PT Start Time 1105    PT Stop Time 1145    PT Time Calculation (min) 40 min    Equipment Utilized During Treatment Gait belt    Activity Tolerance Patient tolerated treatment well    Behavior During Therapy WFL for tasks assessed/performed                  Past Medical History:  Diagnosis Date   Allergic rhinitis due to pollen 11/21/2007   Brachial neuritis or radiculitis NOS    Cervicalgia    Concussion    age 27 - s/p accident   Costal chondritis    Depression    Essential and other specified forms of tremor    GERD (gastroesophageal reflux disease)    in past   Lumbago    Occlusion and stenosis of carotid artery without mention of cerebral infarction    Seizures (HCC)    age 66 - after concussion   Past Surgical History:  Procedure Laterality Date   CATARACT EXTRACTION Right 07/18/14   CATARACT EXTRACTION W/PHACO Left 08/01/2014   Procedure: CATARACT EXTRACTION PHACO AND INTRAOCULAR LENS PLACEMENT (IOC);  Surgeon: Lockie Mola, MD;  Location: [facility];  Service: Ophthalmology;  Laterality: Left;  IVA TOPICAL   COLONOSCOPY  2008   OTHER SURGICAL HISTORY  2002   ear surgery   STAPEDES SURGERY Right 2002   Patient Active Problem List   Diagnosis Date Noted   Dizziness 10/02/2022   Lumbar facet joint pain 06/25/2022   Osteoarthritis of facet joint of lumbar spine 06/25/2022   Lumbosacral facet joint hypertrophy 06/25/2022   Parkinson's disease with dyskinesia (HCC) 01/27/2022   Dysphagia,  pharyngeal phase 09/17/2021   Cervicalgia 08/05/2021   Spondylosis without myelopathy or radiculopathy, lumbosacral region 06/24/2021   Lumbar central spinal stenosis, w/o neurogenic claudication (L4-5) 06/03/2021   Lumbar lateral recess stenosis (Bilateral: L2-3, L4-5) (Right: L3-4) 06/03/2021   Lumbosacral foraminal stenosis (Bilateral: L2-3, L3-4) (Left: L5-S1) 06/03/2021   Lumbar nerve root impingement (Right: L3 at L2-3 & L3-4) 06/03/2021   Ligamentum flavum hypertrophy (L3-4, L4-5) 06/03/2021   Abnormal MRI, lumbar spine (04/23/2021) 04/24/2021   Long term prescription benzodiazepine use (alprazolam) (Xanax) 03/24/2021   Grade 1 Retrolisthesis of L2/L3 (5 mm) and L3/L4 (3 mm) 03/24/2021   Levoscoliosis of lumbar spine (L3-4 apex) 03/24/2021   DDD (degenerative disc disease), lumbosacral 03/24/2021   Lumbosacral facet arthropathy (Left: L3-4, L4-5, and L5-S1) 03/24/2021   Tricompartment osteoarthritis of knee (Left) 03/24/2021   Baker cyst (Left) 03/24/2021   Chronic low back pain (1ry area of Pain) (Bilateral) (R>L) w/o sciatica 03/24/2021   Lumbar facet syndrome (Bilateral) 03/24/2021   Chronic lower extremity pain (2ry area of Pain) (Bilateral) (L>R) 03/24/2021   Lumbosacral radiculitis/sensory radiculopathy at L2 (Bilateral) 03/24/2021   Lumbosacral radiculitis/sensory radiculopathy at L3 (Bilateral) 03/24/2021   Chronic pain syndrome 03/23/2021   Pharmacologic therapy 03/23/2021   Disorder of skeletal system 03/23/2021  Problems influencing health status 03/23/2021   PAD (peripheral artery disease) (HCC) 10/01/2020   GAD (generalized anxiety disorder) 01/13/2018   MDD (major depressive disorder) 01/13/2018   Umbilical hernia 09/04/2017   Chronic knee pain (Left) 08/11/2017   Derangement of medial meniscus, posterior horn (Left) 08/11/2017   Vitamin D insufficiency 03/05/2016   BPH without obstruction/lower urinary tract symptoms 07/08/2015   Chronic fatigue 10/23/2014    Major depression, recurrent, full remission (HCC) 07/26/2013   Unsteady gait 07/26/2013   Orthostatic hypotension 07/26/2013   Depression 07/26/2013   Anxiety 06/23/2013   Chronic anxiety 06/23/2013   Tremor 05/18/2013   Bilateral carotid artery stenosis 08/30/2012    PCP: Smitty Cords, DO  REFERRING PROVIDER:   Mecum, Oswaldo Conroy, PA-C    REFERRING DIAG: R42 (ICD-10-CM) - Dizziness   THERAPY DIAG:  Muscle weakness (generalized)  Unsteadiness on feet  Other lack of coordination  Dizziness and giddiness  Other low back pain  ONSET DATE: August 1st 2024  Rationale for Evaluation and Treatment: Rehabilitation  SUBJECTIVE:   SUBJECTIVE STATEMENT Pt reports he started HEP. He still experiences dizziness with laying down (not spinning).  Pt accompanied by: significant other   PERTINENT HISTORY:  The pt is a pleasant 82 yo male referred to PT for dizziness. Pt known to clinic, previously seen for falls in the setting of PD.  Pt spouse present for eval and reports pt was seen by neurologist recently at [facility] following onset of dizziness about two-three weeks ago. He reports they think he has BPPV.  PT spouse reports he had blood work done at this visit which "didn't show anything" and was normal. Pt unsure if his symptoms come on when he is still at rest. He describes his symptoms as spinning. This can be triggered when he lies on his back and also when he sits up from supine. Spinning lasts for minutes. He reports no nausea.  Pt sometimes sees double when he looks to the side and this has been going on for weeks. Denies any one sided weakness or other changes. He has fallen 4 times in past 3 months. Pt spouse reports pt had a fall about a week prior to dizziness onset. Pt reports no neck pain, has hx of LBP. His spouse reports decline in his mobility since this started. Pt uses RW at baseline for ambulation. Pt now being seen primarily for PD impairments. Pt PMH significant  for Parkinson's disease, concussion, cervicalgia, depression, seizures, chronic L knee pain, chronic anxiety, DDD, PAD, chronic pain syndrome, chronic low back pain, please refer to chart for full, extensive, PMH. PAIN:  Are you having pain? No Back pain hurting prior, took 3 ibuprophen.   PRECAUTIONS: Fall   WEIGHT BEARING RESTRICTIONS: No  FALLS: Has patient fallen in last 6 months? Yes. Number of falls 4  LIVING ENVIRONMENT: via chart and confirmed by pt in session Lives with: lives with their spouse Lives in: House/apartment Stairs: 4 Has following equipment at home: Walker - 2 wheeled  PLOF:  needs assistance, pt with PD that affected his mobility/ADLs  PATIENT GOALS: get rid of dizziness  OBJECTIVE:    VESTIBULAR TREATMENT:  DATE: 12/08/22    NMR: Seated FWD reach>reach to floor>reach overhead>reach arms out to the side  Seated rotate and reach 10x each side Seated LTL step and reach first UUE then BUE 2x10 each way  FWD rock reach 10-15x each side  Seated lateral reach with trunk lateral flexion 10x each way  TE: Hot pack donned to low back while pt completes interventions (pt spouse reports nothing currently applied to low back that would prevent safe use of heat) Seated march 4x10 each LE - rates easy  LLE march only 10x cuing for decreased speed Seated LAQ 2x15 each LE Seated hip abduction 15x bilt LE Seated DF/PF 2x20      PATIENT EDUCATION: Education details: exercise technique, update to HEP  HOME EXERCISE PROGRAM: Updated: Access Code: [email or web address] URL: [email or web address] Date: 12/08/2022 Prepared by: Temple Pacini  Exercises - Seated March  - 1 x daily - 5-6 x weekly - 4 sets - 10 reps - Seated Long Arc Quad  - 1 x daily - 5-6 x weekly - 2 sets - 15 reps - Seated Heel Toe Raises  - 1 x daily - 5-6 x weekly - 2 sets - 20  reps  GOALS:   Goals reviewed with patient? Yes   SHORT TERM GOALS: Target date: 01/19/2023   Patient will be independent in home exercise program to improve dizziness, balance and mobility for better safety and functional independence with ADLs. Baseline: to be initiated future visit/as needed Goal status: INITIAL   LONG TERM GOALS: Target date: 03/02/2023    Patient will increase FOTO score to equal to or greater than 53    to demonstrate statistically significant improvement in mobility and quality of life.  Baseline: 44 Goal status: INITIAL  2.  Patient will reduce dizziness handicap inventory score to <50, for less dizziness with ADLs and increased safety with home and work tasks.  Baseline: deferred, will complete as pt able Goal status: INITIAL  3.  The pt will report at least a 60% decrease in positional or movement triggered dizziness symptoms in order to indicate decreased fall risk and increased balance/safety with mobility.  Baseline: pt presents with decreased mobility due to dizziness, dizziness/vertigo triggered by particular positional; changes Goal status: INITIAL  4.  Patient will reduce timed up and go to <11 seconds to reduce fall risk and demonstrate improved transfer/gait ability.  Baseline: 9/5: 18 sec  Goal status: INITIAL  5.Patient (> 37 years old) will complete five times sit to stand test in < 15 seconds indicating an increased LE strength and improved balance. Baseline: 12/01/22: 26 sec Goal status: NEW  6. Patient will increase 10 meter walk test to >1.45m/s as to improve gait speed for better community ambulation and to reduce fall risk. Baseline: 12/01/22: 0.6 m/s with RW Goal status: NEW   ASSESSMENT:  CLINICAL IMPRESSION: Pt initiated testing previous visit 10/1 for progress note (please refer to this note for details). Further testing to be completed next visit. Appointment focused on reintroducing PD specific interventions and updating  HEP. Pt tolerated exercises well, does best with heat applied to low back while exercising to assist with LBP. The pt will benefit from further skilled PT to address these deficits in order to decrease fall risk, dizziness and improve balance/QOL.     OBJECTIVE IMPAIRMENTS: Abnormal gait, decreased activity tolerance, decreased balance, decreased coordination, decreased mobility, difficulty walking, decreased strength, dizziness, impaired vision/preception, improper body mechanics, and postural dysfunction.   ACTIVITY LIMITATIONS: lifting, bending, squatting,  stairs, transfers, bed mobility, toileting, dressing, hygiene/grooming, and locomotion level  PARTICIPATION LIMITATIONS: meal prep, cleaning, laundry, shopping, community activity, and yard work  PERSONAL FACTORS: Age, Fitness, and 3+ comorbidities: Pt PMH significant for Parkinson's disease, concussion, cervicalgia, depression, seizures, chronic L knee pain, chronic anxiety, DDD, PAD, chronic pain syndrome, chronic low back pain, please refer to chart for full, extensive, PMH.  are also affecting patient's functional outcome.   REHAB POTENTIAL: Good  CLINICAL DECISION MAKING: Evolving/moderate complexity  EVALUATION COMPLEXITY: Moderate   PLAN:  PT FREQUENCY: 1-2x/week  PT DURATION: 12 weeks  PLANNED INTERVENTIONS: Therapeutic exercises, Therapeutic activity, Neuromuscular re-education, Balance training, Gait training, Patient/Family education, Self Care, Joint mobilization, Stair training, Vestibular training, Canalith repositioning, Visual/preceptual remediation/compensation, DME instructions, Electrical stimulation, Wheelchair mobility training, Spinal mobilization, Cryotherapy, Moist heat, Splintting, Taping, Traction, Manual therapy, and Re-evaluation  PLAN FOR NEXT SESSION: further testing as pt able, balance, habituation interventions, assessment of PD related impairments, bed mobility (supine>seated)    Baird Kay,  PT 12/08/2022, 1:43 PM  1:43 PM, 12/08/22   1:43 PM, 12/08/22  Physical Therapist - [facility]  [phone] [facility])

## 2022-12-10 ENCOUNTER — Ambulatory Visit: Payer: Medicare HMO

## 2022-12-10 DIAGNOSIS — R2681 Unsteadiness on feet: Secondary | ICD-10-CM

## 2022-12-10 DIAGNOSIS — G20C Parkinsonism, unspecified: Secondary | ICD-10-CM | POA: Diagnosis not present

## 2022-12-10 DIAGNOSIS — R278 Other lack of coordination: Secondary | ICD-10-CM

## 2022-12-10 DIAGNOSIS — R42 Dizziness and giddiness: Secondary | ICD-10-CM

## 2022-12-10 DIAGNOSIS — M6281 Muscle weakness (generalized): Secondary | ICD-10-CM

## 2022-12-10 DIAGNOSIS — R262 Difficulty in walking, not elsewhere classified: Secondary | ICD-10-CM

## 2022-12-10 DIAGNOSIS — R269 Unspecified abnormalities of gait and mobility: Secondary | ICD-10-CM | POA: Diagnosis not present

## 2022-12-10 DIAGNOSIS — R2689 Other abnormalities of gait and mobility: Secondary | ICD-10-CM | POA: Diagnosis not present

## 2022-12-10 DIAGNOSIS — M5459 Other low back pain: Secondary | ICD-10-CM | POA: Diagnosis not present

## 2022-12-10 NOTE — Therapy (Signed)
OUTPATIENT OCCUPATIONAL THERAPY NEURO EVALUATION  Patient Name: Joseph Arroyo MRN: [id number] DOB:Jul 12, 1940, 82 y.o., male Today's Date: 12/11/2022  PCP: Dr. Saralyn Arroyo REFERRING PROVIDER: PCP  END OF SESSION:  OT End of Session - 12/11/22 0828     Visit Number 1    Number of Visits 24    Date for OT Re-Evaluation 03/04/23    Progress Note Due on Visit 10    OT Start Time 1315    OT Stop Time 1400    OT Time Calculation (min) 45 min    Equipment Utilized During Treatment RW    Activity Tolerance Patient tolerated treatment well    Behavior During Therapy [facility] for tasks assessed/performed            Past Medical History:  Diagnosis Date   Allergic rhinitis due to pollen 11/21/2007   Brachial neuritis or radiculitis NOS    Cervicalgia    Concussion    age 39 - s/p accident   Costal chondritis    Depression    Essential and other specified forms of tremor    GERD (gastroesophageal reflux disease)    in past   Lumbago    Occlusion and stenosis of carotid artery without mention of cerebral infarction    Seizures [facility])    age 49 - after concussion   Past Surgical History:  Procedure Laterality Date   CATARACT EXTRACTION Right 07/18/14   CATARACT EXTRACTION W/PHACO Left 08/01/2014   Procedure: CATARACT EXTRACTION PHACO AND INTRAOCULAR LENS PLACEMENT (IOC);  Surgeon: Joseph Mola, MD;  Location: [facility];  Service: Ophthalmology;  Laterality: Left;  IVA TOPICAL   COLONOSCOPY  2008   OTHER SURGICAL HISTORY  2002   ear surgery   STAPEDES SURGERY Right 2002   Patient Active Problem List   Diagnosis Date Noted   Dizziness 10/02/2022   Lumbar facet joint pain 06/25/2022   Osteoarthritis of facet joint of lumbar spine 06/25/2022   Lumbosacral facet joint hypertrophy 06/25/2022   Parkinson's disease with dyskinesia (HCC) 01/27/2022   Dysphagia, pharyngeal phase 09/17/2021   Cervicalgia 08/05/2021   Spondylosis without myelopathy or  radiculopathy, lumbosacral region 06/24/2021   Lumbar central spinal stenosis, w/o neurogenic claudication (L4-5) 06/03/2021   Lumbar lateral recess stenosis (Bilateral: L2-3, L4-5) (Right: L3-4) 06/03/2021   Lumbosacral foraminal stenosis (Bilateral: L2-3, L3-4) (Left: L5-S1) 06/03/2021   Lumbar nerve root impingement (Right: L3 at L2-3 & L3-4) 06/03/2021   Ligamentum flavum hypertrophy (L3-4, L4-5) 06/03/2021   Abnormal MRI, lumbar spine (04/23/2021) 04/24/2021   Long term prescription benzodiazepine use (alprazolam) (Xanax) 03/24/2021   Grade 1 Retrolisthesis of L2/L3 (5 mm) and L3/L4 (3 mm) 03/24/2021   Levoscoliosis of lumbar spine (L3-4 apex) 03/24/2021   DDD (degenerative disc disease), lumbosacral 03/24/2021   Lumbosacral facet arthropathy (Left: L3-4, L4-5, and L5-S1) 03/24/2021   Tricompartment osteoarthritis of knee (Left) 03/24/2021   Baker cyst (Left) 03/24/2021   Chronic low back pain (1ry area of Pain) (Bilateral) (R>L) w/o sciatica 03/24/2021   Lumbar facet syndrome (Bilateral) 03/24/2021   Chronic lower extremity pain (2ry area of Pain) (Bilateral) (L>R) 03/24/2021   Lumbosacral radiculitis/sensory radiculopathy at L2 (Bilateral) 03/24/2021   Lumbosacral radiculitis/sensory radiculopathy at L3 (Bilateral) 03/24/2021   Chronic pain syndrome 03/23/2021   Pharmacologic therapy 03/23/2021   Disorder of skeletal system 03/23/2021   Problems influencing health status 03/23/2021   PAD (peripheral artery disease) (HCC) 10/01/2020   GAD (generalized anxiety disorder) 01/13/2018   MDD (major depressive disorder) 01/13/2018  Umbilical hernia 09/04/2017   Chronic knee pain (Left) 08/11/2017   Derangement of medial meniscus, posterior horn (Left) 08/11/2017   Vitamin D insufficiency 03/05/2016   BPH without obstruction/lower urinary tract symptoms 07/08/2015   Chronic fatigue 10/23/2014   Major depression, recurrent, full remission (HCC) 07/26/2013   Unsteady gait 07/26/2013    Orthostatic hypotension 07/26/2013   Depression 07/26/2013   Anxiety 06/23/2013   Chronic anxiety 06/23/2013   Tremor 05/18/2013   Bilateral carotid artery stenosis 08/30/2012   ONSET DATE: 2015  REFERRING DIAG: Parkinson's Disease  THERAPY DIAG:  Muscle weakness (generalized)  Other lack of coordination  Rationale for Evaluation and Treatment: Rehabilitation  SUBJECTIVE:  SUBJECTIVE STATEMENT: Pt reports he had been going to the gym regularly up until 4-6 months ago, but had to stop due worsening balance and back pain. Pt accompanied by: significant other, spouse, Joseph Arroyo  PERTINENT HISTORY: Hx of Parkinson's disease (HCC); Chronic knee pain (Left); Derangement of medial meniscus, posterior horn (Left); PAD (peripheral artery disease) (HCC); Chronic pain syndrome; DDD (degenerative disc disease) lumbosacral; osteoarthritis of knee (Left); Chronic low back pain, Lumbar facet syndrome (Bilateral); Chronic lower extremity pain.   PRECAUTIONS: Fall  WEIGHT BEARING RESTRICTIONS: No  PAIN:  Are you having pain? Yes: NPRS scale: 5/10 Pain location: low back  Pain description: aching, sometimes sharp  Aggravating factors: activity  Relieving factors: lying down, heat, ice pack  FALLS: Has patient fallen in last 6 months? Yes. Number of falls 4  LIVING ENVIRONMENT: Lives with: lives with their spouse Lives in: 1 level home Stairs: 4 steps, a landing, then 3 more steps with bilat rails.  Has following equipment at home: Dan Humphreys - 2 wheeled, Shower bench, bed side commode, and Grab bars   PLOF: Needs assistance with ADLs.  Pt performs basic self care with set up assist, extra time to manage clothing fasteners, occasional min A on days with worsening Parkinson's symptoms or back pain.    PATIENT GOALS: Pt wants to regain some strength and coordination skills in bilat hands.   OBJECTIVE:  Note: Objective measures were completed at Evaluation unless otherwise noted.  HAND DOMINANCE:  Right  ADLs: Overall ADLs: Extensive time required; occasional min A from spouse Transfers/ambulation related to ADLs: supv-modified indep with RW Eating: difficulty cutting food, struggles with loading food on a fork (uses built up foam handles at times); spouse cuts steak Grooming: electric toothbrush and standard toothbrush, but mostly uses electric; increased difficulty with brushing teeth and shaving (has electric trimmer and regular razor) UB Dressing: magnetic buttons on some shirts; more difficult to button shirts, occasional help with donning a shirt (min A)  LB Dressing: difficulty tying shoe laces, increased time; uses long shoe horn, has a hip kit; occasional assist to don socks  Toileting: modified indep/extra time  Bathing: able to take a shower with supv with tub bench Tub Shower transfers: transfer tub bench Equipment:  Pt does have hip kit and he regularly uses the long shoe horn.  Spouse reports pt may need some review with the other tools.   IADLs: Shopping: Spouse manages Light housekeeping: Spouse manages Meal Prep: Spouse manages Community mobility: Uses RW and requires frequent sitting breaks d/t back pain Medication management: modified Engineer, materials: Extra time for writing checks, with occasional assist from spouse on days when tremors are worse Handwriting: 90% legible, though pt reports this is an exceptional day today, and pt reports he's recently had to rip of checks and have spouse take over  d/t poor legibility.  MOBILITY STATUS: Hx of falls; spouse reports pt to have had 4 falls in the last 6 mo  POSTURE COMMENTS:  rounded shoulders, increased thoracic kyphosis, and hx of scoliosis , L shoulder rests significantly lower than R Sitting balance: Supports self with one extremity  ACTIVITY TOLERANCE: Activity tolerance: limited by low back pain   FUNCTIONAL OUTCOME MEASURES: FOTO: TBD  UPPER EXTREMITY ROM:  BUEs WFL  UPPER EXTREMITY MMT:      MMT Right eval Left eval  Shoulder flexion 5 5  Shoulder abduction 5 5  Shoulder adduction    Shoulder extension    Shoulder internal rotation 4 4  Shoulder external rotation 4 4  Middle trapezius    Lower trapezius    Elbow flexion    Elbow extension    Wrist flexion    Wrist extension    Wrist ulnar deviation    Wrist radial deviation    Wrist pronation    Wrist supination    (Blank rows = not tested)  HAND FUNCTION: Grip strength: Right: 30 lbs; Left: 45 lbs, Lateral pinch: Right: 12 lbs, Left: 5 lbs, and 3 point pinch: Right: 10 lbs, Left: 6 lbs  COORDINATION: 9 Hole Peg test: Right: 33 sec; Left: 41 sec  SENSATION: Not tested  EDEMA: No visible edema  MUSCLE TONE: RUE: Within functional limits and LUE: Within functional limits  COGNITION: Overall cognitive status: Within functional limits for tasks assessed; spouse reports some impulsivity/poor safety awareness contributing to falls at home  VISION: No recent changes; wears glasses for reading Visual history: macular degeneration, hx of cataract removal both eyes  PERCEPTION: Not tested  PRAXIS: Impaired: Motor planning  TODAY'S TREATMENT:                                                                                                                              DATE: 12/10/22 Evaluation completed.   PATIENT EDUCATION: Education details: OT goals, poc Person educated: Patient and Spouse Education method: Explanation Education comprehension: verbalized understanding  HOME EXERCISE PROGRAM: To be initiated within the next 1-2 sessions  GOALS: Goals reviewed with patient? Yes  SHORT TERM GOALS: Target date: 01/21/23  Pt will perform HEP with supv for increasing strength and coordination in B hands.  Baseline: Eval: Will review previous HEP and add to as appropriate Goal status: INITIAL  2.  Pt will implement fall prevention strategies with min vc from spouse at least 50% of the time.  Baseline:  Spouse reports pt with 4 falls over the last 6 months, noting decreased safety awareness/impulsivity Goal status: INITIAL  LONG TERM GOALS: Target date: 03/04/2023  Pt will increase FOTO score by (TBD) to indicate improvement in perceived functional performance with daily tasks. Baseline: Eval: FOTO to be given next session. Goal status: INITIAL  2.  Pt will utilize AE/activity modification to participate in face time calls with family [place] with set up/supv. Baseline: Eval: Pt reports  he is struggling to accurately press buttons on his cell phone, often having spouse assist to manage a call (Initial recommendation at eval to transition to tablet for Face time calls to work with larger screen surface; spouse receptive) Goal status: INITIAL  3.  Pt will use adapted writing aids/utilize positioning strategies/activity modifications to enable pt to take simple notes when reading scripture and to write checks legibly. Baseline: Eval: Pt reports he has not been able to take notes in awhile d/t poor legibility and this task requiring increased effort and fatigue. Goal status: INITIAL  4.  Pt will increase bilat grip strength by 5 or more lbs to improve ability to open containers.  Baseline: Eval: R grip: 30 lbs, L grip: 45 lbs Goal status: INITIAL  5.  Pt will improve [facility] skills to increase efficiency with manipulation of eating and grooming utensils.   Baseline: Eval: R 33 sec, L 41 sec Goal status: INITIAL  ASSESSMENT:  CLINICAL IMPRESSION: Patient is an 82 y.o. male who was seen today for occupational therapy evaluation for functional decline related to Parkinson's Disease.  Pt known to this OT, with pt being seen in this clinic last approximately 1 year ago.  Spouse reports pt with 4 falls in the 6 months, and pt verbalizes increased challenges with ADLs which have fine motor requirements d/t worsening BUE dyskinesias.  Pt reports increased difficulty with writing checks which is affected  by tremors and micrographia, managing clothing fasteners, and manipulating eating and grooming utensils.  Pt will benefit from skilled OT to review/progress HEP, review/advise on activity modifications/AE, provide exercises and activities to promote hand strength and coordination skills, and reinforce fall prevention strategies to maximize safety, efficiency, and independence with daily tasks.  Pt/spouse in agreement with poc.   PERFORMANCE DEFICITS: in functional skills including ADLs, IADLs, coordination, dexterity, strength, pain, Fine motor control, Gross motor control, mobility, balance, body mechanics, endurance, decreased knowledge of use of DME, and UE functional use, cognitive skills including memory and safety awareness, and psychosocial skills including coping strategies, environmental adaptation, habits, and routines and behaviors.   IMPAIRMENTS: are limiting patient from ADLs, IADLs, leisure, and social participation.   CO-MORBIDITIES: has co-morbidities such as chronic pain syndrome, spinal stenosis, DDD, dizziness   that affects occupational performance. Patient will benefit from skilled OT to address above impairments and improve overall function.  MODIFICATION OR ASSISTANCE TO COMPLETE EVALUATION: No modification of tasks or assist necessary to complete an evaluation.  OT OCCUPATIONAL PROFILE AND HISTORY: Problem focused assessment: Including review of records relating to presenting problem.  CLINICAL DECISION MAKING: Moderate - several treatment options, min-mod task modification necessary  REHAB POTENTIAL: Good  EVALUATION COMPLEXITY: Low    PLAN:  OT FREQUENCY: 2x/week  OT DURATION: 12 weeks  PLANNED INTERVENTIONS: 97535 self care/ADL training, 29528 therapeutic exercise, 97530 therapeutic activity, 97112 neuromuscular re-education, 97010 moist heat, 97010 cryotherapy, passive range of motion, balance training, functional mobility training, visual/perceptual  remediation/compensation, psychosocial skills training, energy conservation, coping strategies training, patient/family education, and DME and/or AE instructions  RECOMMENDED OTHER SERVICES: None at this time  CONSULTED AND AGREED WITH PLAN OF CARE: family member/caregiver  PLAN FOR NEXT SESSION: FOTO; see above   Danelle Earthly, MS, OTR/L  Otis Dials, OT 12/11/2022, 8:29 AM

## 2022-12-10 NOTE — Therapy (Signed)
OUTPATIENT PHYSICAL THERAPY NEURO TREATMENT      Patient Name: Joseph Arroyo MRN: [id number] DOB:07-26-1940, 82 y.o., male Today's Date: 12/10/2022  END OF SESSION:  PT End of Session - 12/10/22 1327     Visit Number 11    Number of Visits 33    Date for PT Re-Evaluation 02/23/23    Authorization Type [employer]    Progress Note Due on Visit 10    PT Start Time 1401    PT Stop Time 1441    PT Time Calculation (min) 40 min    Equipment Utilized During Treatment Gait belt    Activity Tolerance Patient tolerated treatment well    Behavior During Therapy WFL for tasks assessed/performed                  Past Medical History:  Diagnosis Date   Allergic rhinitis due to pollen 11/21/2007   Brachial neuritis or radiculitis NOS    Cervicalgia    Concussion    age 35 - s/p accident   Costal chondritis    Depression    Essential and other specified forms of tremor    GERD (gastroesophageal reflux disease)    in past   Lumbago    Occlusion and stenosis of carotid artery without mention of cerebral infarction    Seizures (HCC)    age 37 - after concussion   Past Surgical History:  Procedure Laterality Date   CATARACT EXTRACTION Right 07/18/14   CATARACT EXTRACTION W/PHACO Left 08/01/2014   Procedure: CATARACT EXTRACTION PHACO AND INTRAOCULAR LENS PLACEMENT (IOC);  Surgeon: Lockie Mola, MD;  Location: [facility];  Service: Ophthalmology;  Laterality: Left;  IVA TOPICAL   COLONOSCOPY  2008   OTHER SURGICAL HISTORY  2002   ear surgery   STAPEDES SURGERY Right 2002   Patient Active Problem List   Diagnosis Date Noted   Dizziness 10/02/2022   Lumbar facet joint pain 06/25/2022   Osteoarthritis of facet joint of lumbar spine 06/25/2022   Lumbosacral facet joint hypertrophy 06/25/2022   Parkinson's disease with dyskinesia (HCC) 01/27/2022   Dysphagia, pharyngeal phase 09/17/2021   Cervicalgia 08/05/2021   Spondylosis without myelopathy or  radiculopathy, lumbosacral region 06/24/2021   Lumbar central spinal stenosis, w/o neurogenic claudication (L4-5) 06/03/2021   Lumbar lateral recess stenosis (Bilateral: L2-3, L4-5) (Right: L3-4) 06/03/2021   Lumbosacral foraminal stenosis (Bilateral: L2-3, L3-4) (Left: L5-S1) 06/03/2021   Lumbar nerve root impingement (Right: L3 at L2-3 & L3-4) 06/03/2021   Ligamentum flavum hypertrophy (L3-4, L4-5) 06/03/2021   Abnormal MRI, lumbar spine (04/23/2021) 04/24/2021   Long term prescription benzodiazepine use (alprazolam) (Xanax) 03/24/2021   Grade 1 Retrolisthesis of L2/L3 (5 mm) and L3/L4 (3 mm) 03/24/2021   Levoscoliosis of lumbar spine (L3-4 apex) 03/24/2021   DDD (degenerative disc disease), lumbosacral 03/24/2021   Lumbosacral facet arthropathy (Left: L3-4, L4-5, and L5-S1) 03/24/2021   Tricompartment osteoarthritis of knee (Left) 03/24/2021   Baker cyst (Left) 03/24/2021   Chronic low back pain (1ry area of Pain) (Bilateral) (R>L) w/o sciatica 03/24/2021   Lumbar facet syndrome (Bilateral) 03/24/2021   Chronic lower extremity pain (2ry area of Pain) (Bilateral) (L>R) 03/24/2021   Lumbosacral radiculitis/sensory radiculopathy at L2 (Bilateral) 03/24/2021   Lumbosacral radiculitis/sensory radiculopathy at L3 (Bilateral) 03/24/2021   Chronic pain syndrome 03/23/2021   Pharmacologic therapy 03/23/2021   Disorder of skeletal system 03/23/2021   Problems influencing health status 03/23/2021   PAD (peripheral artery disease) (HCC) 10/01/2020   GAD (generalized  anxiety disorder) 01/13/2018   MDD (major depressive disorder) 01/13/2018   Umbilical hernia 09/04/2017   Chronic knee pain (Left) 08/11/2017   Derangement of medial meniscus, posterior horn (Left) 08/11/2017   Vitamin D insufficiency 03/05/2016   BPH without obstruction/lower urinary tract symptoms 07/08/2015   Chronic fatigue 10/23/2014   Major depression, recurrent, full remission (HCC) 07/26/2013   Unsteady gait 07/26/2013    Orthostatic hypotension 07/26/2013   Depression 07/26/2013   Anxiety 06/23/2013   Chronic anxiety 06/23/2013   Tremor 05/18/2013   Bilateral carotid artery stenosis 08/30/2012    PCP: Smitty Cords, DO  REFERRING PROVIDER:   Mecum, Oswaldo Conroy, PA-C    REFERRING DIAG: R42 (ICD-10-CM) - Dizziness   THERAPY DIAG:  Muscle weakness (generalized)  Other lack of coordination  Difficulty in walking, not elsewhere classified  Unsteadiness on feet  Dizziness and giddiness  ONSET DATE: August 1st 2024  Rationale for Evaluation and Treatment: Rehabilitation  SUBJECTIVE:   SUBJECTIVE STATEMENT Pt reports increased back pain currently, had heat on it while in OT to help pain. He took 3 ibuprofen prior to appointments. He rates pain as 4-5/10. Dizziness has been about the same/unchanged. Pt accompanied by: significant other   PERTINENT HISTORY:  The pt is a pleasant 82 yo male referred to PT for dizziness. Pt known to clinic, previously seen for falls in the setting of PD.  Pt spouse present for eval and reports pt was seen by neurologist recently at [facility] following onset of dizziness about two-three weeks ago. He reports they think he has BPPV.  PT spouse reports he had blood work done at this visit which "didn't show anything" and was normal. Pt unsure if his symptoms come on when he is still at rest. He describes his symptoms as spinning. This can be triggered when he lies on his back and also when he sits up from supine. Spinning lasts for minutes. He reports no nausea.  Pt sometimes sees double when he looks to the side and this has been going on for weeks. Denies any one sided weakness or other changes. He has fallen 4 times in past 3 months. Pt spouse reports pt had a fall about a week prior to dizziness onset. Pt reports no neck pain, has hx of LBP. His spouse reports decline in his mobility since this started. Pt uses RW at baseline for ambulation. Pt now being seen primarily  for PD impairments. Pt PMH significant for Parkinson's disease, concussion, cervicalgia, depression, seizures, chronic L knee pain, chronic anxiety, DDD, PAD, chronic pain syndrome, chronic low back pain, please refer to chart for full, extensive, PMH. PAIN:  Are you having pain? No Back pain hurting prior, took 3 ibuprophen.   PRECAUTIONS: Fall   WEIGHT BEARING RESTRICTIONS: No  FALLS: Has patient fallen in last 6 months? Yes. Number of falls 4  LIVING ENVIRONMENT: via chart and confirmed by pt in session Lives with: lives with their spouse Lives in: House/apartment Stairs: 4 Has following equipment at home: Walker - 2 wheeled  PLOF:  needs assistance, pt with PD that affected his mobility/ADLs  PATIENT GOALS: get rid of dizziness  OBJECTIVE:    VESTIBULAR TREATMENT:  DATE: 12/10/22  TA: Further goal reassessment completed this visit. Please refer to goal and assessment sections below for details.  NMR:  Seated FWD reach>reach to floor>reach overhead>reach arms out to the side 2x10  Seated rotate and reach 2x10 each side Seated LTL step and reach first UUE 1x10 each way  FWD rock reach 10x each side - pt reports discomfort in low back, intervention discontinued Seated lateral reach with trunk lateral flexion 10x, 7x each way. Rates hard   TE:  Seated march 4x10 each LE - rates easy  STS 8x to RW   PATIENT EDUCATION: Education details: exercise technique,  goals  HOME EXERCISE PROGRAM: Updated: Access Code: [email or web address] URL: [email or web address] Date: 12/08/2022 Prepared by: Temple Pacini  Exercises - Seated March  - 1 x daily - 5-6 x weekly - 4 sets - 10 reps - Seated Long Arc Quad  - 1 x daily - 5-6 x weekly - 2 sets - 15 reps - Seated Heel Toe Raises  - 1 x daily - 5-6 x weekly - 2 sets - 20 reps  GOALS:   Goals reviewed with patient? Yes   SHORT  TERM GOALS: Target date: 01/21/2023   Patient will be independent in home exercise program to improve dizziness, balance and mobility for better safety and functional independence with ADLs. Baseline: to be initiated future visit/as needed Goal status: INITIAL   LONG TERM GOALS: Target date: 03/04/2023    Patient will increase FOTO score to equal to or greater than 53    to demonstrate statistically significant improvement in mobility and quality of life.  Baseline: 44 Goal status: INITIAL  2.  Patient will reduce dizziness handicap inventory score to <50, for less dizziness with ADLs and increased safety with home and work tasks.  Baseline: deferred, will complete as pt able: 10/10: 48, still has difficulty with bed mobility  Goal status: MET  3.  The pt will report at least a 60% decrease in positional or movement triggered dizziness symptoms in order to indicate decreased fall risk and increased balance/safety with mobility.  Baseline: pt presents with decreased mobility due to dizziness, dizziness/vertigo triggered by particular positional; changes; 10/10: 60-70%  Goal status: MET  4.  Patient will reduce timed up and go to <11 seconds to reduce fall risk and demonstrate improved transfer/gait ability.  Baseline: 9/5: 18 sec; 10/10: 16 seconds seconds  Goal status: INITIAL  5.Patient (> 2 years old) will complete five times sit to stand test in < 15 seconds indicating an increased LE strength and improved balance. Baseline: 12/01/22: 26 sec Goal status: NEW  6. Patient will increase 10 meter walk test to >1.42m/s as to improve gait speed for better community ambulation and to reduce fall risk. Baseline: 12/01/22: 0.6 m/s with RW Goal status: NEW   ASSESSMENT:  CLINICAL IMPRESSION: Further testing completed. Pt has met DHI goal and dizziness symptom reduction goals. He also improved TUG performance/score, indicating slight decrease in fall risk, although pt is still in range for  increased risk for future fall. The pt will benefit from further skilled PT to address these deficits in order to decrease fall risk, dizziness and improve balance/QOL.     OBJECTIVE IMPAIRMENTS: Abnormal gait, decreased activity tolerance, decreased balance, decreased coordination, decreased mobility, difficulty walking, decreased strength, dizziness, impaired vision/preception, improper body mechanics, and postural dysfunction.   ACTIVITY LIMITATIONS: lifting, bending, squatting, stairs, transfers, bed mobility, toileting, dressing, hygiene/grooming, and locomotion level  PARTICIPATION LIMITATIONS: meal prep, cleaning, laundry, shopping,  community activity, and yard work  PERSONAL FACTORS: Age, Fitness, and 3+ comorbidities: Pt PMH significant for Parkinson's disease, concussion, cervicalgia, depression, seizures, chronic L knee pain, chronic anxiety, DDD, PAD, chronic pain syndrome, chronic low back pain, please refer to chart for full, extensive, PMH.  are also affecting patient's functional outcome.   REHAB POTENTIAL: Good  CLINICAL DECISION MAKING: Evolving/moderate complexity  EVALUATION COMPLEXITY: Moderate   PLAN:  PT FREQUENCY: 1-2x/week  PT DURATION: 12 weeks  PLANNED INTERVENTIONS: Therapeutic exercises, Therapeutic activity, Neuromuscular re-education, Balance training, Gait training, Patient/Family education, Self Care, Joint mobilization, Stair training, Vestibular training, Canalith repositioning, Visual/preceptual remediation/compensation, DME instructions, Electrical stimulation, Wheelchair mobility training, Spinal mobilization, Cryotherapy, Moist heat, Splintting, Taping, Traction, Manual therapy, and Re-evaluation  PLAN FOR NEXT SESSION: attempt standing/supported PD interventions,  bed mobility (supine>seated) as able    Baird Kay, PT 12/10/2022, 4:36 PM  4:36 PM, 12/10/22   4:36 PM, 12/10/22  Physical Therapist - [facility]  [phone] [facility])

## 2022-12-14 ENCOUNTER — Ambulatory Visit: Payer: Medicare HMO

## 2022-12-14 NOTE — Therapy (Signed)
OUTPATIENT PHYSICAL THERAPY NEURO TREATMENT      Patient Name: Joseph Arroyo MRN: [id number] DOB:[birth year], 82 y.o., male Today's Date: 12/15/2022  END OF SESSION:  PT End of Session - 12/15/22 1345     Visit Number 12    Number of Visits 33    Date for PT Re-Evaluation 02/23/23    Authorization Type [employer] Medicare    Progress Note Due on Visit 10    PT Start Time 1401    PT Stop Time 1444    PT Time Calculation (min) 43 min    Equipment Utilized During Treatment Gait belt    Activity Tolerance Patient tolerated treatment well    Behavior During Therapy WFL for tasks assessed/performed                   Past Medical History:  Diagnosis Date   Allergic rhinitis due to pollen 11/21/2007   Brachial neuritis or radiculitis NOS    Cervicalgia    Concussion    age 68 - s/p accident   Costal chondritis    Depression    Essential and other specified forms of tremor    GERD (gastroesophageal reflux disease)    in past   Lumbago    Occlusion and stenosis of carotid artery without mention of cerebral infarction    Seizures (HCC)    age 71 - after concussion   Past Surgical History:  Procedure Laterality Date   CATARACT EXTRACTION Right 07/18/14   CATARACT EXTRACTION W/PHACO Left 08/01/2014   Procedure: CATARACT EXTRACTION PHACO AND INTRAOCULAR LENS PLACEMENT (IOC);  Surgeon: Lockie Mola, MD;  Location: [facility];  Service: Ophthalmology;  Laterality: Left;  IVA TOPICAL   COLONOSCOPY  2008   OTHER SURGICAL HISTORY  2002   ear surgery   STAPEDES SURGERY Right 2002   Patient Active Problem List   Diagnosis Date Noted   Dizziness 10/02/2022   Lumbar facet joint pain 06/25/2022   Osteoarthritis of facet joint of lumbar spine 06/25/2022   Lumbosacral facet joint hypertrophy 06/25/2022   Parkinson's disease with dyskinesia (HCC) 01/27/2022   Dysphagia, pharyngeal phase 09/17/2021   Cervicalgia 08/05/2021   Spondylosis without myelopathy  or radiculopathy, lumbosacral region 06/24/2021   Lumbar central spinal stenosis, w/o neurogenic claudication (L4-5) 06/03/2021   Lumbar lateral recess stenosis (Bilateral: L2-3, L4-5) (Right: L3-4) 06/03/2021   Lumbosacral foraminal stenosis (Bilateral: L2-3, L3-4) (Left: L5-S1) 06/03/2021   Lumbar nerve root impingement (Right: L3 at L2-3 & L3-4) 06/03/2021   Ligamentum flavum hypertrophy (L3-4, L4-5) 06/03/2021   Abnormal MRI, lumbar spine (04/23/2021) 04/24/2021   Long term prescription benzodiazepine use (alprazolam) (Xanax) 03/24/2021   Grade 1 Retrolisthesis of L2/L3 (5 mm) and L3/L4 (3 mm) 03/24/2021   Levoscoliosis of lumbar spine (L3-4 apex) 03/24/2021   DDD (degenerative disc disease), lumbosacral 03/24/2021   Lumbosacral facet arthropathy (Left: L3-4, L4-5, and L5-S1) 03/24/2021   Tricompartment osteoarthritis of knee (Left) 03/24/2021   Baker cyst (Left) 03/24/2021   Chronic low back pain (1ry area of Pain) (Bilateral) (R>L) w/o sciatica 03/24/2021   Lumbar facet syndrome (Bilateral) 03/24/2021   Chronic lower extremity pain (2ry area of Pain) (Bilateral) (L>R) 03/24/2021   Lumbosacral radiculitis/sensory radiculopathy at L2 (Bilateral) 03/24/2021   Lumbosacral radiculitis/sensory radiculopathy at L3 (Bilateral) 03/24/2021   Chronic pain syndrome 03/23/2021   Pharmacologic therapy 03/23/2021   Disorder of skeletal system 03/23/2021   Problems influencing health status 03/23/2021   PAD (peripheral artery disease) (HCC) 10/01/2020   GAD (  generalized anxiety disorder) 01/13/2018   MDD (major depressive disorder) 01/13/2018   Umbilical hernia 09/04/2017   Chronic knee pain (Left) 08/11/2017   Derangement of medial meniscus, posterior horn (Left) 08/11/2017   Vitamin D insufficiency 03/05/2016   BPH without obstruction/lower urinary tract symptoms 07/08/2015   Chronic fatigue 10/23/2014   Major depression, recurrent, full remission (HCC) 07/26/2013   Unsteady gait 07/26/2013    Orthostatic hypotension 07/26/2013   Depression 07/26/2013   Anxiety 06/23/2013   Chronic anxiety 06/23/2013   Tremor 05/18/2013   Bilateral carotid artery stenosis 08/30/2012    PCP: Smitty Cords, DO  REFERRING PROVIDER:   Mecum, Oswaldo Conroy, PA-C    REFERRING DIAG: R42 (ICD-10-CM) - Dizziness   THERAPY DIAG:  Muscle weakness (generalized)  Difficulty in walking, not elsewhere classified  Unsteadiness on feet  Dizziness and giddiness  ONSET DATE: August 1st 2024  Rationale for Evaluation and Treatment: Rehabilitation  SUBJECTIVE:   SUBJECTIVE STATEMENT Patient reports 8/10 back pain today.   Pt accompanied by: significant other   PERTINENT HISTORY:  The pt is a pleasant 82 yo male referred to PT for dizziness. Pt known to clinic, previously seen for falls in the setting of PD.  Pt spouse present for eval and reports pt was seen by neurologist recently at [facility] following onset of dizziness about two-three weeks ago. He reports they think he has BPPV.  PT spouse reports he had blood work done at this visit which "didn't show anything" and was normal. Pt unsure if his symptoms come on when he is still at rest. He describes his symptoms as spinning. This can be triggered when he lies on his back and also when he sits up from supine. Spinning lasts for minutes. He reports no nausea.  Pt sometimes sees double when he looks to the side and this has been going on for weeks. Denies any one sided weakness or other changes. He has fallen 4 times in past 3 months. Pt spouse reports pt had a fall about a week prior to dizziness onset. Pt reports no neck pain, has hx of LBP. His spouse reports decline in his mobility since this started. Pt uses RW at baseline for ambulation. Pt now being seen primarily for PD impairments. Pt PMH significant for Parkinson's disease, concussion, cervicalgia, depression, seizures, chronic L knee pain, chronic anxiety, DDD, PAD, chronic pain syndrome,  chronic low back pain, please refer to chart for full, extensive, PMH. PAIN:  Are you having pain? No Back pain hurting prior, took 3 ibuprophen.   PRECAUTIONS: Fall   WEIGHT BEARING RESTRICTIONS: No  FALLS: Has patient fallen in last 6 months? Yes. Number of falls 4  LIVING ENVIRONMENT: via chart and confirmed by pt in session Lives with: lives with their spouse Lives in: House/apartment Stairs: 4 Has following equipment at home: Walker - 2 wheeled  PLOF:  needs assistance, pt with PD that affected his mobility/ADLs  PATIENT GOALS: get rid of dizziness  OBJECTIVE:     TREATMENT:  DATE: 12/15/22    TE:  Seated Ankle weights 3lb: -march 10x each LE; 2 sets  -LAQ 10x each LE 2 sets  -heel raise 15x -lateral step over line and back 10x; 2 sets -soccer ball kicks for pertubation's and reaction timing x4 minutes -lateral dribble then kick x4 minutes   Adductor ball squeeze 15x  Hamstring curl with swiss ball 5x RTB abduction 15x; 2 sets ; second set single leg Overhead Y 15x PATIENT EDUCATION: Education details: exercise technique,  goals  HOME EXERCISE PROGRAM: Updated: Access Code: [email or web address] URL: [email or web address] Date: 12/08/2022 Prepared by: Temple Pacini  Exercises - Seated March  - 1 x daily - 5-6 x weekly - 4 sets - 10 reps - Seated Long Arc Quad  - 1 x daily - 5-6 x weekly - 2 sets - 15 reps - Seated Heel Toe Raises  - 1 x daily - 5-6 x weekly - 2 sets - 20 reps  GOALS:   Goals reviewed with patient? Yes   SHORT TERM GOALS: Target date: 01/26/2023   Patient will be independent in home exercise program to improve dizziness, balance and mobility for better safety and functional independence with ADLs. Baseline: to be initiated future visit/as needed Goal status: INITIAL   LONG TERM GOALS: Target date: 03/09/2023    Patient will increase  FOTO score to equal to or greater than 53    to demonstrate statistically significant improvement in mobility and quality of life.  Baseline: 44 Goal status: INITIAL  2.  Patient will reduce dizziness handicap inventory score to <50, for less dizziness with ADLs and increased safety with home and work tasks.  Baseline: deferred, will complete as pt able: 10/10: 48, still has difficulty with bed mobility  Goal status: MET  3.  The pt will report at least a 60% decrease in positional or movement triggered dizziness symptoms in order to indicate decreased fall risk and increased balance/safety with mobility.  Baseline: pt presents with decreased mobility due to dizziness, dizziness/vertigo triggered by particular positional; changes; 10/10: 60-70%  Goal status: MET  4.  Patient will reduce timed up and go to <11 seconds to reduce fall risk and demonstrate improved transfer/gait ability.  Baseline: 9/5: 18 sec; 10/10: 16 seconds seconds  Goal status: INITIAL  5.Patient (> 47 years old) will complete five times sit to stand test in < 15 seconds indicating an increased LE strength and improved balance. Baseline: 12/01/22: 26 sec Goal status: NEW  6. Patient will increase 10 meter walk test to >1.48m/s as to improve gait speed for better community ambulation and to reduce fall risk. Baseline: 12/01/22: 0.6 m/s with RW Goal status: NEW   ASSESSMENT:  CLINICAL IMPRESSION:  Patient presents with extreme back pain requiring change of plan of interventions. He is highly motivated throughout session despite pain. Will benefit from LE strengthening and coordination in future sessions. The pt will benefit from further skilled PT to address these deficits in order to decrease fall risk, dizziness and improve balance/QOL.     OBJECTIVE IMPAIRMENTS: Abnormal gait, decreased activity tolerance, decreased balance, decreased coordination, decreased mobility, difficulty walking, decreased strength, dizziness,  impaired vision/preception, improper body mechanics, and postural dysfunction.   ACTIVITY LIMITATIONS: lifting, bending, squatting, stairs, transfers, bed mobility, toileting, dressing, hygiene/grooming, and locomotion level  PARTICIPATION LIMITATIONS: meal prep, cleaning, laundry, shopping, community activity, and yard work  PERSONAL FACTORS: Age, Fitness, and 3+ comorbidities: Pt PMH significant for Parkinson's disease, concussion, cervicalgia, depression, seizures, chronic L knee pain,  chronic anxiety, DDD, PAD, chronic pain syndrome, chronic low back pain, please refer to chart for full, extensive, PMH.  are also affecting patient's functional outcome.   REHAB POTENTIAL: Good  CLINICAL DECISION MAKING: Evolving/moderate complexity  EVALUATION COMPLEXITY: Moderate   PLAN:  PT FREQUENCY: 1-2x/week  PT DURATION: 12 weeks  PLANNED INTERVENTIONS: Therapeutic exercises, Therapeutic activity, Neuromuscular re-education, Balance training, Gait training, Patient/Family education, Self Care, Joint mobilization, Stair training, Vestibular training, Canalith repositioning, Visual/preceptual remediation/compensation, DME instructions, Electrical stimulation, Wheelchair mobility training, Spinal mobilization, Cryotherapy, Moist heat, Splintting, Taping, Traction, Manual therapy, and Re-evaluation  PLAN FOR NEXT SESSION: attempt standing/supported PD interventions,  bed mobility (supine>seated) as able    Precious Bard, PT 12/15/2022, 2:44 PM  2:44 PM, 12/15/22   2:44 PM, 12/15/22  Physical Therapist - [facility]  [phone] [facility])

## 2022-12-15 ENCOUNTER — Ambulatory Visit: Payer: Medicare HMO

## 2022-12-15 DIAGNOSIS — R2681 Unsteadiness on feet: Secondary | ICD-10-CM

## 2022-12-15 DIAGNOSIS — R2689 Other abnormalities of gait and mobility: Secondary | ICD-10-CM | POA: Diagnosis not present

## 2022-12-15 DIAGNOSIS — R278 Other lack of coordination: Secondary | ICD-10-CM

## 2022-12-15 DIAGNOSIS — R262 Difficulty in walking, not elsewhere classified: Secondary | ICD-10-CM | POA: Diagnosis not present

## 2022-12-15 DIAGNOSIS — M6281 Muscle weakness (generalized): Secondary | ICD-10-CM

## 2022-12-15 DIAGNOSIS — G20C Parkinsonism, unspecified: Secondary | ICD-10-CM | POA: Diagnosis not present

## 2022-12-15 DIAGNOSIS — R269 Unspecified abnormalities of gait and mobility: Secondary | ICD-10-CM | POA: Diagnosis not present

## 2022-12-15 DIAGNOSIS — R42 Dizziness and giddiness: Secondary | ICD-10-CM | POA: Diagnosis not present

## 2022-12-15 DIAGNOSIS — M5459 Other low back pain: Secondary | ICD-10-CM | POA: Diagnosis not present

## 2022-12-15 NOTE — Therapy (Signed)
OUTPATIENT OCCUPATIONAL THERAPY NEURO TREATMENT NOTE  Patient Name: Joseph Arroyo MRN: [id number] DOB:Sep 23, 1940, 82 y.o., male Today's Date: 12/15/2022  PCP: Dr. Saralyn Pilar REFERRING PROVIDER: PCP  END OF SESSION:  OT End of Session - 12/15/22 1416     Visit Number 2    Number of Visits 24    Date for OT Re-Evaluation 03/04/23    Progress Note Due on Visit 10    OT Start Time 1315    OT Stop Time 1400    OT Time Calculation (min) 45 min    Equipment Utilized During Treatment RW    Activity Tolerance Patient tolerated treatment well    Behavior During Therapy [facility] for tasks assessed/performed            Past Medical History:  Diagnosis Date   Allergic rhinitis due to pollen 11/21/2007   Brachial neuritis or radiculitis NOS    Cervicalgia    Concussion    age 47 - s/p accident   Costal chondritis    Depression    Essential and other specified forms of tremor    GERD (gastroesophageal reflux disease)    in past   Lumbago    Occlusion and stenosis of carotid artery without mention of cerebral infarction    Seizures [facility])    age 85 - after concussion   Past Surgical History:  Procedure Laterality Date   CATARACT EXTRACTION Right 07/18/14   CATARACT EXTRACTION W/PHACO Left 08/01/2014   Procedure: CATARACT EXTRACTION PHACO AND INTRAOCULAR LENS PLACEMENT (IOC);  Surgeon: Lockie Mola, MD;  Location: [facility];  Service: Ophthalmology;  Laterality: Left;  IVA TOPICAL   COLONOSCOPY  2008   OTHER SURGICAL HISTORY  2002   ear surgery   STAPEDES SURGERY Right 2002   Patient Active Problem List   Diagnosis Date Noted   Dizziness 10/02/2022   Lumbar facet joint pain 06/25/2022   Osteoarthritis of facet joint of lumbar spine 06/25/2022   Lumbosacral facet joint hypertrophy 06/25/2022   Parkinson's disease with dyskinesia (HCC) 01/27/2022   Dysphagia, pharyngeal phase 09/17/2021   Cervicalgia 08/05/2021   Spondylosis without myelopathy  or radiculopathy, lumbosacral region 06/24/2021   Lumbar central spinal stenosis, w/o neurogenic claudication (L4-5) 06/03/2021   Lumbar lateral recess stenosis (Bilateral: L2-3, L4-5) (Right: L3-4) 06/03/2021   Lumbosacral foraminal stenosis (Bilateral: L2-3, L3-4) (Left: L5-S1) 06/03/2021   Lumbar nerve root impingement (Right: L3 at L2-3 & L3-4) 06/03/2021   Ligamentum flavum hypertrophy (L3-4, L4-5) 06/03/2021   Abnormal MRI, lumbar spine (04/23/2021) 04/24/2021   Long term prescription benzodiazepine use (alprazolam) (Xanax) 03/24/2021   Grade 1 Retrolisthesis of L2/L3 (5 mm) and L3/L4 (3 mm) 03/24/2021   Levoscoliosis of lumbar spine (L3-4 apex) 03/24/2021   DDD (degenerative disc disease), lumbosacral 03/24/2021   Lumbosacral facet arthropathy (Left: L3-4, L4-5, and L5-S1) 03/24/2021   Tricompartment osteoarthritis of knee (Left) 03/24/2021   Baker cyst (Left) 03/24/2021   Chronic low back pain (1ry area of Pain) (Bilateral) (R>L) w/o sciatica 03/24/2021   Lumbar facet syndrome (Bilateral) 03/24/2021   Chronic lower extremity pain (2ry area of Pain) (Bilateral) (L>R) 03/24/2021   Lumbosacral radiculitis/sensory radiculopathy at L2 (Bilateral) 03/24/2021   Lumbosacral radiculitis/sensory radiculopathy at L3 (Bilateral) 03/24/2021   Chronic pain syndrome 03/23/2021   Pharmacologic therapy 03/23/2021   Disorder of skeletal system 03/23/2021   Problems influencing health status 03/23/2021   PAD (peripheral artery disease) (HCC) 10/01/2020   GAD (generalized anxiety disorder) 01/13/2018   MDD (major depressive disorder)  01/13/2018   Umbilical hernia 09/04/2017   Chronic knee pain (Left) 08/11/2017   Derangement of medial meniscus, posterior horn (Left) 08/11/2017   Vitamin D insufficiency 03/05/2016   BPH without obstruction/lower urinary tract symptoms 07/08/2015   Chronic fatigue 10/23/2014   Major depression, recurrent, full remission (HCC) 07/26/2013   Unsteady gait 07/26/2013    Orthostatic hypotension 07/26/2013   Depression 07/26/2013   Anxiety 06/23/2013   Chronic anxiety 06/23/2013   Tremor 05/18/2013   Bilateral carotid artery stenosis 08/30/2012   ONSET DATE: 2015  REFERRING DIAG: Parkinson's Disease  THERAPY DIAG:  Muscle weakness (generalized)  Other lack of coordination  Rationale for Evaluation and Treatment: Rehabilitation  SUBJECTIVE:  SUBJECTIVE STATEMENT: Pt reports higher pain levels today.  Spouse reports they held off on the Gabapentin today to try to avoid drowsiness.   Pt accompanied by: significant other, spouse, [name]  PERTINENT HISTORY: Hx of Parkinson's disease (HCC); Chronic knee pain (Left); Derangement of medial meniscus, posterior horn (Left); PAD (peripheral artery disease) (HCC); Chronic pain syndrome; DDD (degenerative disc disease) lumbosacral; osteoarthritis of knee (Left); Chronic low back pain, Lumbar facet syndrome (Bilateral); Chronic lower extremity pain.   PRECAUTIONS: Fall  WEIGHT BEARING RESTRICTIONS: No  PAIN:  Are you having pain? Yes: NPRS scale: 8-10/10 Pain location: low back  Pain description: aching, sometimes sharp  Aggravating factors: activity  Relieving factors: lying down, heat, ice pack  FALLS: Has patient fallen in last 6 months? Yes. Number of falls 4  LIVING ENVIRONMENT: Lives with: lives with their spouse Lives in: 1 level home Stairs: 4 steps, a landing, then 3 more steps with bilat rails.  Has following equipment at home: Dan Humphreys - 2 wheeled, Shower bench, bed side commode, and Grab bars   PLOF: Needs assistance with ADLs.  Pt performs basic self care with set up assist, extra time to manage clothing fasteners, occasional min A on days with worsening Parkinson's symptoms or back pain.    PATIENT GOALS: Pt wants to regain some strength and coordination skills in bilat hands.   OBJECTIVE:  Note: Objective measures were completed at Evaluation unless otherwise noted.  HAND DOMINANCE:  Right  ADLs: Overall ADLs: Extensive time required; occasional min A from spouse Transfers/ambulation related to ADLs: supv-modified indep with RW Eating: difficulty cutting food, struggles with loading food on a fork (uses built up foam handles at times); spouse cuts steak Grooming: electric toothbrush and standard toothbrush, but mostly uses electric; increased difficulty with brushing teeth and shaving (has electric trimmer and regular razor) UB Dressing: magnetic buttons on some shirts; more difficult to button shirts, occasional help with donning a shirt (min A)  LB Dressing: difficulty tying shoe laces, increased time; uses long shoe horn, has a hip kit; occasional assist to don socks  Toileting: modified indep/extra time  Bathing: able to take a shower with supv with tub bench Tub Shower transfers: transfer tub bench Equipment:  Pt does have hip kit and he regularly uses the long shoe horn.  Spouse reports pt may need some review with the other tools.   IADLs: Shopping: Spouse manages Light housekeeping: Spouse manages Meal Prep: Spouse manages Community mobility: Uses RW and requires frequent sitting breaks d/t back pain Medication management: modified Engineer, materials: Extra time for writing checks, with occasional assist from spouse on days when tremors are worse Handwriting: 90% legible, though pt reports this is an exceptional day today, and pt reports he's recently had to rip of checks and have spouse take  over d/t poor legibility.  MOBILITY STATUS: Hx of falls; spouse reports pt to have had 4 falls in the last 6 mo  POSTURE COMMENTS:  rounded shoulders, increased thoracic kyphosis, and hx of scoliosis , L shoulder rests significantly lower than R Sitting balance: Supports self with one extremity  ACTIVITY TOLERANCE: Activity tolerance: limited by low back pain   FUNCTIONAL OUTCOME MEASURES: FOTO: TBD  UPPER EXTREMITY ROM:  BUEs WFL  UPPER EXTREMITY MMT:      MMT Right eval Left eval  Shoulder flexion 5 5  Shoulder abduction 5 5  Shoulder adduction    Shoulder extension    Shoulder internal rotation 4 4  Shoulder external rotation 4 4  Middle trapezius    Lower trapezius    Elbow flexion    Elbow extension    Wrist flexion    Wrist extension    Wrist ulnar deviation    Wrist radial deviation    Wrist pronation    Wrist supination    (Blank rows = not tested)  HAND FUNCTION: Grip strength: Right: 30 lbs; Left: 45 lbs, Lateral pinch: Right: 12 lbs, Left: 5 lbs, and 3 point pinch: Right: 10 lbs, Left: 6 lbs  COORDINATION: 9 Hole Peg test: Right: 33 sec; Left: 41 sec  SENSATION: Not tested  EDEMA: No visible edema  MUSCLE TONE: RUE: Within functional limits and LUE: Within functional limits  COGNITION: Overall cognitive status: Within functional limits for tasks assessed; spouse reports some impulsivity/poor safety awareness contributing to falls at home  VISION: No recent changes; wears glasses for reading Visual history: macular degeneration, hx of cataract removal both eyes  PERCEPTION: Not tested  PRAXIS: Impaired: Motor planning  TODAY'S TREATMENT:                                                                                                                              DATE: 12/15/22 Moist heat applied to low back with support of lumbar pillow; a second pillow wedged between R side and arm rest of chair for midline position and back pain management throughout session.  Self Care: Trial of weighted spoon vs standard spoon, with pt moving pop beads from sectioned plate to cup.  Increased control noted with weighted spoon; pt/spouse in agreement.  OT provided education on options to obtain weighted silverware set.  Trial of weighted pen, 1 and 2 weights, plastic wide grip pen vs wide foam grip, use of leather surface beneath paper for increasing proprioceptive input.  Pt practiced signing name, writing phone number,  filling out a check.  Pt found most benefit from 1 weight on pen with wide/foam grip.  Issued foam grip for home and advised pt/spouse on options to obtain pen weights.  Pt verbalized concern of micrographia.  Advised on use of external cues using lined paper, and reinforced working on new habits to increase attention to letter height, requiring an intentional awareness to letter height ongoing.  PATIENT  EDUCATION: Education details: adaptive pens/adapted eating utensils Person educated: Patient and Spouse Education method: Explanation, demo Education comprehension: verbalized understanding  HOME EXERCISE PROGRAM: To be initiated within the next 1-2 sessions  GOALS: Goals reviewed with patient? Yes  SHORT TERM GOALS: Target date: 01/21/23  Pt will perform HEP with supv for increasing strength and coordination in B hands.  Baseline: Eval: Will review previous HEP and add to as appropriate Goal status: INITIAL  2.  Pt will implement fall prevention strategies with min vc from spouse at least 50% of the time.  Baseline: Spouse reports pt with 4 falls over the last 6 months, noting decreased safety awareness/impulsivity Goal status: INITIAL  LONG TERM GOALS: Target date: 03/04/2023  Pt will increase FOTO score by (TBD) to indicate improvement in perceived functional performance with daily tasks. Baseline: Eval: FOTO to be given next session. Goal status: INITIAL  2.  Pt will utilize AE/activity modification to participate in face time calls with family [place] with set up/supv. Baseline: Eval: Pt reports he is struggling to accurately press buttons on his cell phone, often having spouse assist to manage a call (Initial recommendation at eval to transition to tablet for Face time calls to work with larger screen surface; spouse receptive) Goal status: INITIAL  3.  Pt will use adapted writing aids/utilize positioning strategies/activity modifications to enable pt to take simple notes when  reading scripture and to write checks legibly. Baseline: Eval: Pt reports he has not been able to take notes in awhile d/t poor legibility and this task requiring increased effort and fatigue. Goal status: INITIAL  4.  Pt will increase bilat grip strength by 5 or more lbs to improve ability to open containers.  Baseline: Eval: R grip: 30 lbs, L grip: 45 lbs Goal status: INITIAL  5.  Pt will improve [facility] skills to increase efficiency with manipulation of eating and grooming utensils.   Baseline: Eval: R 33 sec, L 41 sec Goal status: INITIAL  ASSESSMENT:  CLINICAL IMPRESSION: Pt reporting higher back pain levels today.  Spouse reports they held off on the Gabapentin today to try to avoid drowsiness.  Moist heat applied to low back with support of lumbar pillow; a second pillow wedged between R side and arm rest of chair for midline position and back pain management throughout session, which pt reported to be effective.  Pt/spouse had questions regarding weighted utensils and pens.  Trialed both today, with both aids allowing pt an improvement in control of spoon and pen.  OT advised on options to obtain.  Spouse reports she will plan to order pen weights and a set of weighted utensils.  Mild tremor noted with handwriting, but bradykinesia and micrographia more significantly impacting writing ability.  OT advised on external cues to reduce micrographia.  Lined paper effective and intermittent cues for increasing attention to letter/number height throughout task.  Pt will continue to benefit from skilled OT to review/progress HEP, review/advise on activity modifications/AE, provide exercises and activities to promote hand strength and coordination skills, and reinforce fall prevention strategies to maximize safety, efficiency, and independence with daily tasks.  Pt/spouse in agreement with poc.   PERFORMANCE DEFICITS: in functional skills including ADLs, IADLs, coordination, dexterity, strength, pain,  Fine motor control, Gross motor control, mobility, balance, body mechanics, endurance, decreased knowledge of use of DME, and UE functional use, cognitive skills including memory and safety awareness, and psychosocial skills including coping strategies, environmental adaptation, habits, and routines and behaviors.   IMPAIRMENTS: are  limiting patient from ADLs, IADLs, leisure, and social participation.   CO-MORBIDITIES: has co-morbidities such as chronic pain syndrome, spinal stenosis, DDD, dizziness   that affects occupational performance. Patient will benefit from skilled OT to address above impairments and improve overall function.  MODIFICATION OR ASSISTANCE TO COMPLETE EVALUATION: No modification of tasks or assist necessary to complete an evaluation.  OT OCCUPATIONAL PROFILE AND HISTORY: Problem focused assessment: Including review of records relating to presenting problem.  CLINICAL DECISION MAKING: Moderate - several treatment options, min-mod task modification necessary  REHAB POTENTIAL: Good  EVALUATION COMPLEXITY: Low    PLAN:  OT FREQUENCY: 2x/week  OT DURATION: 12 weeks  PLANNED INTERVENTIONS: 97535 self care/ADL training, 16109 therapeutic exercise, 97530 therapeutic activity, 97112 neuromuscular re-education, 97010 moist heat, 97010 cryotherapy, passive range of motion, balance training, functional mobility training, visual/perceptual remediation/compensation, psychosocial skills training, energy conservation, coping strategies training, patient/family education, and DME and/or AE instructions  RECOMMENDED OTHER SERVICES: None at this time  CONSULTED AND AGREED WITH PLAN OF CARE: family member/caregiver  PLAN FOR NEXT SESSION: FOTO; see above   Danelle Earthly, MS, OTR/L  Otis Dials, OT 12/15/2022, 2:17 PM

## 2022-12-17 ENCOUNTER — Ambulatory Visit: Payer: Medicare HMO

## 2022-12-17 ENCOUNTER — Telehealth: Payer: Self-pay | Admitting: Family Medicine

## 2022-12-17 ENCOUNTER — Ambulatory Visit: Payer: Medicare HMO | Admitting: Occupational Therapy

## 2022-12-17 DIAGNOSIS — R262 Difficulty in walking, not elsewhere classified: Secondary | ICD-10-CM | POA: Diagnosis not present

## 2022-12-17 DIAGNOSIS — R2689 Other abnormalities of gait and mobility: Secondary | ICD-10-CM | POA: Diagnosis not present

## 2022-12-17 DIAGNOSIS — M5459 Other low back pain: Secondary | ICD-10-CM | POA: Diagnosis not present

## 2022-12-17 DIAGNOSIS — R2681 Unsteadiness on feet: Secondary | ICD-10-CM | POA: Diagnosis not present

## 2022-12-17 DIAGNOSIS — R269 Unspecified abnormalities of gait and mobility: Secondary | ICD-10-CM | POA: Diagnosis not present

## 2022-12-17 DIAGNOSIS — R278 Other lack of coordination: Secondary | ICD-10-CM

## 2022-12-17 DIAGNOSIS — R42 Dizziness and giddiness: Secondary | ICD-10-CM | POA: Diagnosis not present

## 2022-12-17 DIAGNOSIS — G20C Parkinsonism, unspecified: Secondary | ICD-10-CM | POA: Diagnosis not present

## 2022-12-17 DIAGNOSIS — M6281 Muscle weakness (generalized): Secondary | ICD-10-CM

## 2022-12-17 NOTE — Therapy (Signed)
OUTPATIENT PHYSICAL THERAPY NEURO TREATMENT      Patient Name: Joseph Arroyo MRN: [id number] DOB:1940-04-28, 82 y.o., male Today's Date: 12/17/2022  END OF SESSION:  PT End of Session - 12/17/22 1736     Visit Number 13    Number of Visits 33    Date for PT Re-Evaluation 02/23/23    Authorization Type [employer]    Progress Note Due on Visit 10    PT Start Time 1404    PT Stop Time 1444    PT Time Calculation (min) 40 min    Equipment Utilized During Treatment Gait belt    Activity Tolerance Patient tolerated treatment well;Patient limited by pain    Behavior During Therapy WFL for tasks assessed/performed                    Past Medical History:  Diagnosis Date   Allergic rhinitis due to pollen 11/21/2007   Brachial neuritis or radiculitis NOS    Cervicalgia    Concussion    age 3 - s/p accident   Costal chondritis    Depression    Essential and other specified forms of tremor    GERD (gastroesophageal reflux disease)    in past   Lumbago    Occlusion and stenosis of carotid artery without mention of cerebral infarction    Seizures (HCC)    age 52 - after concussion   Past Surgical History:  Procedure Laterality Date   CATARACT EXTRACTION Right 07/18/14   CATARACT EXTRACTION W/PHACO Left 08/01/2014   Procedure: CATARACT EXTRACTION PHACO AND INTRAOCULAR LENS PLACEMENT (IOC);  Surgeon: Lockie Mola, MD;  Location: [facility];  Service: Ophthalmology;  Laterality: Left;  IVA TOPICAL   COLONOSCOPY  2008   OTHER SURGICAL HISTORY  2002   ear surgery   STAPEDES SURGERY Right 2002   Patient Active Problem List   Diagnosis Date Noted   Dizziness 10/02/2022   Lumbar facet joint pain 06/25/2022   Osteoarthritis of facet joint of lumbar spine 06/25/2022   Lumbosacral facet joint hypertrophy 06/25/2022   Parkinson's disease with dyskinesia (HCC) 01/27/2022   Dysphagia, pharyngeal phase 09/17/2021   Cervicalgia 08/05/2021    Spondylosis without myelopathy or radiculopathy, lumbosacral region 06/24/2021   Lumbar central spinal stenosis, w/o neurogenic claudication (L4-5) 06/03/2021   Lumbar lateral recess stenosis (Bilateral: L2-3, L4-5) (Right: L3-4) 06/03/2021   Lumbosacral foraminal stenosis (Bilateral: L2-3, L3-4) (Left: L5-S1) 06/03/2021   Lumbar nerve root impingement (Right: L3 at L2-3 & L3-4) 06/03/2021   Ligamentum flavum hypertrophy (L3-4, L4-5) 06/03/2021   Abnormal MRI, lumbar spine (04/23/2021) 04/24/2021   Long term prescription benzodiazepine use (alprazolam) (Xanax) 03/24/2021   Grade 1 Retrolisthesis of L2/L3 (5 mm) and L3/L4 (3 mm) 03/24/2021   Levoscoliosis of lumbar spine (L3-4 apex) 03/24/2021   DDD (degenerative disc disease), lumbosacral 03/24/2021   Lumbosacral facet arthropathy (Left: L3-4, L4-5, and L5-S1) 03/24/2021   Tricompartment osteoarthritis of knee (Left) 03/24/2021   Baker cyst (Left) 03/24/2021   Chronic low back pain (1ry area of Pain) (Bilateral) (R>L) w/o sciatica 03/24/2021   Lumbar facet syndrome (Bilateral) 03/24/2021   Chronic lower extremity pain (2ry area of Pain) (Bilateral) (L>R) 03/24/2021   Lumbosacral radiculitis/sensory radiculopathy at L2 (Bilateral) 03/24/2021   Lumbosacral radiculitis/sensory radiculopathy at L3 (Bilateral) 03/24/2021   Chronic pain syndrome 03/23/2021   Pharmacologic therapy 03/23/2021   Disorder of skeletal system 03/23/2021   Problems influencing health status 03/23/2021   PAD (peripheral artery disease) (HCC)  10/01/2020   GAD (generalized anxiety disorder) 01/13/2018   MDD (major depressive disorder) 01/13/2018   Umbilical hernia 09/04/2017   Chronic knee pain (Left) 08/11/2017   Derangement of medial meniscus, posterior horn (Left) 08/11/2017   Vitamin D insufficiency 03/05/2016   BPH without obstruction/lower urinary tract symptoms 07/08/2015   Chronic fatigue 10/23/2014   Major depression, recurrent, full remission (HCC)  07/26/2013   Unsteady gait 07/26/2013   Orthostatic hypotension 07/26/2013   Depression 07/26/2013   Anxiety 06/23/2013   Chronic anxiety 06/23/2013   Tremor 05/18/2013   Bilateral carotid artery stenosis 08/30/2012    PCP: Smitty Cords, DO  REFERRING PROVIDER:   Mecum, Oswaldo Conroy, PA-C    REFERRING DIAG: R42 (ICD-10-CM) - Dizziness   THERAPY DIAG:  Muscle weakness (generalized)  Other lack of coordination  Unsteadiness on feet  ONSET DATE: August 1st 2024  Rationale for Evaluation and Treatment: Rehabilitation  SUBJECTIVE:   SUBJECTIVE STATEMENT Patient reports 8/10 back pain today. He comes from OT appointment.   Pt accompanied by: significant other   PERTINENT HISTORY:  The pt is a pleasant 82 yo male referred to PT for dizziness. Pt known to clinic, previously seen for falls in the setting of PD.  Pt spouse present for eval and reports pt was seen by neurologist recently at [facility] following onset of dizziness about two-three weeks ago. He reports they think he has BPPV.  PT spouse reports he had blood work done at this visit which "didn't show anything" and was normal. Pt unsure if his symptoms come on when he is still at rest. He describes his symptoms as spinning. This can be triggered when he lies on his back and also when he sits up from supine. Spinning lasts for minutes. He reports no nausea.  Pt sometimes sees double when he looks to the side and this has been going on for weeks. Denies any one sided weakness or other changes. He has fallen 4 times in past 3 months. Pt spouse reports pt had a fall about a week prior to dizziness onset. Pt reports no neck pain, has hx of LBP. His spouse reports decline in his mobility since this started. Pt uses RW at baseline for ambulation. Pt now being seen primarily for PD impairments. Pt PMH significant for Parkinson's disease, concussion, cervicalgia, depression, seizures, chronic L knee pain, chronic anxiety, DDD, PAD,  chronic pain syndrome, chronic low back pain, please refer to chart for full, extensive, PMH. PAIN:  Are you having pain? No Back pain hurting prior, took 3 ibuprophen.   PRECAUTIONS: Fall   WEIGHT BEARING RESTRICTIONS: No  FALLS: Has patient fallen in last 6 months? Yes. Number of falls 4  LIVING ENVIRONMENT: via chart and confirmed by pt in session Lives with: lives with their spouse Lives in: House/apartment Stairs: 4 Has following equipment at home: Walker - 2 wheeled  PLOF:  needs assistance, pt with PD that affected his mobility/ADLs  PATIENT GOALS: get rid of dizziness  OBJECTIVE:     TREATMENT:  DATE: 12/17/22  TE:  Seated: -march 10x each LE; 4 sets  -PF 2x20 bilat -DF 2x20 bilat  -LAQ 15x each LE 2 sets  Seated adductor squeeze with pball 2x10 with 3 sec hold/rep  Seated adductor isometric with p.ball and hip IR/ER 2x15 of each rates medium Hamstring curls with YTB 2x10 each LE    PATIENT EDUCATION: Education details: exercise technique  HOME EXERCISE PROGRAM: Updated: Access Code: [email or web address] URL: [email or web address] Date: 12/08/2022 Prepared by: Temple Pacini  Exercises - Seated March  - 1 x daily - 5-6 x weekly - 4 sets - 10 reps - Seated Long Arc Quad  - 1 x daily - 5-6 x weekly - 2 sets - 15 reps - Seated Heel Toe Raises  - 1 x daily - 5-6 x weekly - 2 sets - 20 reps  GOALS:   Goals reviewed with patient? Yes   SHORT TERM GOALS: Target date: 01/28/2023   Patient will be independent in home exercise program to improve dizziness, balance and mobility for better safety and functional independence with ADLs. Baseline: to be initiated future visit/as needed Goal status: INITIAL   LONG TERM GOALS: Target date: 03/11/2023    Patient will increase FOTO score to equal to or greater than 53    to demonstrate statistically significant  improvement in mobility and quality of life.  Baseline: 44 Goal status: INITIAL  2.  Patient will reduce dizziness handicap inventory score to <50, for less dizziness with ADLs and increased safety with home and work tasks.  Baseline: deferred, will complete as pt able: 10/10: 48, still has difficulty with bed mobility  Goal status: MET  3.  The pt will report at least a 60% decrease in positional or movement triggered dizziness symptoms in order to indicate decreased fall risk and increased balance/safety with mobility.  Baseline: pt presents with decreased mobility due to dizziness, dizziness/vertigo triggered by particular positional; changes; 10/10: 60-70%  Goal status: MET  4.  Patient will reduce timed up and go to <11 seconds to reduce fall risk and demonstrate improved transfer/gait ability.  Baseline: 9/5: 18 sec; 10/10: 16 seconds seconds  Goal status: INITIAL  5.Patient (> 57 years old) will complete five times sit to stand test in < 15 seconds indicating an increased LE strength and improved balance. Baseline: 12/01/22: 26 sec Goal status: NEW  6. Patient will increase 10 meter walk test to >1.17m/s as to improve gait speed for better community ambulation and to reduce fall risk. Baseline: 12/01/22: 0.6 m/s with RW Goal status: NEW   ASSESSMENT:  CLINICAL IMPRESSION: Interventions regressed and performed seated as pt presents with high levels of LBP. He was able to tolerate thse well without reports of increase in pain from his baseline. Will continue to monitor. The pt will benefit from further skilled PT to address these deficits in order to decrease fall risk, dizziness and improve balance/QOL.     OBJECTIVE IMPAIRMENTS: Abnormal gait, decreased activity tolerance, decreased balance, decreased coordination, decreased mobility, difficulty walking, decreased strength, dizziness, impaired vision/preception, improper body mechanics, and postural dysfunction.   ACTIVITY  LIMITATIONS: lifting, bending, squatting, stairs, transfers, bed mobility, toileting, dressing, hygiene/grooming, and locomotion level  PARTICIPATION LIMITATIONS: meal prep, cleaning, laundry, shopping, community activity, and yard work  PERSONAL FACTORS: Age, Fitness, and 3+ comorbidities: Pt PMH significant for Parkinson's disease, concussion, cervicalgia, depression, seizures, chronic L knee pain, chronic anxiety, DDD, PAD, chronic pain syndrome, chronic low back pain, please refer to chart for full, extensive,  PMH.  are also affecting patient's functional outcome.   REHAB POTENTIAL: Good  CLINICAL DECISION MAKING: Evolving/moderate complexity  EVALUATION COMPLEXITY: Moderate   PLAN:  PT FREQUENCY: 1-2x/week  PT DURATION: 12 weeks  PLANNED INTERVENTIONS: Therapeutic exercises, Therapeutic activity, Neuromuscular re-education, Balance training, Gait training, Patient/Family education, Self Care, Joint mobilization, Stair training, Vestibular training, Canalith repositioning, Visual/preceptual remediation/compensation, DME instructions, Electrical stimulation, Wheelchair mobility training, Spinal mobilization, Cryotherapy, Moist heat, Splintting, Taping, Traction, Manual therapy, and Re-evaluation  PLAN FOR NEXT SESSION: attempt standing/supported PD interventions,  bed mobility (supine>seated) as able    Baird Kay, PT 12/17/2022, 5:38 PM  5:38 PM, 12/17/22   5:38 PM, 12/17/22  Physical Therapist - [facility]  [phone] [facility])

## 2022-12-17 NOTE — Therapy (Addendum)
OUTPATIENT OCCUPATIONAL THERAPY NEURO TREATMENT NOTE  Patient Name: Joseph Arroyo MRN: [id number] DOB:08-19-1940, 82 y.o., male Today's Date: 12/17/2022  PCP: Dr. Saralyn Pilar REFERRING PROVIDER: PCP  END OF SESSION:  OT End of Session - 12/17/22 1637     Visit Number 3    Number of Visits 24    Date for OT Re-Evaluation 03/04/23    OT Start Time 1320    OT Stop Time 1400    OT Time Calculation (min) 40 min    Activity Tolerance Patient tolerated treatment well    Behavior During Therapy [facility] for tasks assessed/performed            Past Medical History:  Diagnosis Date   Allergic rhinitis due to pollen 11/21/2007   Brachial neuritis or radiculitis NOS    Cervicalgia    Concussion    age 67 - s/p accident   Costal chondritis    Depression    Essential and other specified forms of tremor    GERD (gastroesophageal reflux disease)    in past   Lumbago    Occlusion and stenosis of carotid artery without mention of cerebral infarction    Seizures [facility])    age 83 - after concussion   Past Surgical History:  Procedure Laterality Date   CATARACT EXTRACTION Right 07/18/14   CATARACT EXTRACTION W/PHACO Left 08/01/2014   Procedure: CATARACT EXTRACTION PHACO AND INTRAOCULAR LENS PLACEMENT (IOC);  Surgeon: Lockie Mola, MD;  Location: [facility];  Service: Ophthalmology;  Laterality: Left;  IVA TOPICAL   COLONOSCOPY  2008   OTHER SURGICAL HISTORY  2002   ear surgery   STAPEDES SURGERY Right 2002   Patient Active Problem List   Diagnosis Date Noted   Dizziness 10/02/2022   Lumbar facet joint pain 06/25/2022   Osteoarthritis of facet joint of lumbar spine 06/25/2022   Lumbosacral facet joint hypertrophy 06/25/2022   Parkinson's disease with dyskinesia (HCC) 01/27/2022   Dysphagia, pharyngeal phase 09/17/2021   Cervicalgia 08/05/2021   Spondylosis without myelopathy or radiculopathy, lumbosacral region 06/24/2021   Lumbar central spinal  stenosis, w/o neurogenic claudication (L4-5) 06/03/2021   Lumbar lateral recess stenosis (Bilateral: L2-3, L4-5) (Right: L3-4) 06/03/2021   Lumbosacral foraminal stenosis (Bilateral: L2-3, L3-4) (Left: L5-S1) 06/03/2021   Lumbar nerve root impingement (Right: L3 at L2-3 & L3-4) 06/03/2021   Ligamentum flavum hypertrophy (L3-4, L4-5) 06/03/2021   Abnormal MRI, lumbar spine (04/23/2021) 04/24/2021   Long term prescription benzodiazepine use (alprazolam) (Xanax) 03/24/2021   Grade 1 Retrolisthesis of L2/L3 (5 mm) and L3/L4 (3 mm) 03/24/2021   Levoscoliosis of lumbar spine (L3-4 apex) 03/24/2021   DDD (degenerative disc disease), lumbosacral 03/24/2021   Lumbosacral facet arthropathy (Left: L3-4, L4-5, and L5-S1) 03/24/2021   Tricompartment osteoarthritis of knee (Left) 03/24/2021   Baker cyst (Left) 03/24/2021   Chronic low back pain (1ry area of Pain) (Bilateral) (R>L) w/o sciatica 03/24/2021   Lumbar facet syndrome (Bilateral) 03/24/2021   Chronic lower extremity pain (2ry area of Pain) (Bilateral) (L>R) 03/24/2021   Lumbosacral radiculitis/sensory radiculopathy at L2 (Bilateral) 03/24/2021   Lumbosacral radiculitis/sensory radiculopathy at L3 (Bilateral) 03/24/2021   Chronic pain syndrome 03/23/2021   Pharmacologic therapy 03/23/2021   Disorder of skeletal system 03/23/2021   Problems influencing health status 03/23/2021   PAD (peripheral artery disease) (HCC) 10/01/2020   GAD (generalized anxiety disorder) 01/13/2018   MDD (major depressive disorder) 01/13/2018   Umbilical hernia 09/04/2017   Chronic knee pain (Left) 08/11/2017   Derangement of  medial meniscus, posterior horn (Left) 08/11/2017   Vitamin D insufficiency 03/05/2016   BPH without obstruction/lower urinary tract symptoms 07/08/2015   Chronic fatigue 10/23/2014   Major depression, recurrent, full remission (HCC) 07/26/2013   Unsteady gait 07/26/2013   Orthostatic hypotension 07/26/2013   Depression 07/26/2013   Anxiety  06/23/2013   Chronic anxiety 06/23/2013   Tremor 05/18/2013   Bilateral carotid artery stenosis 08/30/2012   ONSET DATE: 2015  REFERRING DIAG: Parkinson's Disease  THERAPY DIAG:  Muscle weakness (generalized)  Other lack of coordination  Rationale for Evaluation and Treatment: Rehabilitation  SUBJECTIVE:  SUBJECTIVE STATEMENT: Pt reports higher pain levels today.  Spouse reports they held off on the Gabapentin today to try to avoid drowsiness.   Pt accompanied by: significant other, spouse, [name]  PERTINENT HISTORY: Hx of Parkinson's disease (HCC); Chronic knee pain (Left); Derangement of medial meniscus, posterior horn (Left); PAD (peripheral artery disease) (HCC); Chronic pain syndrome; DDD (degenerative disc disease) lumbosacral; osteoarthritis of knee (Left); Chronic low back pain, Lumbar facet syndrome (Bilateral); Chronic lower extremity pain.   PRECAUTIONS: Fall  WEIGHT BEARING RESTRICTIONS: No  PAIN:  Are you having pain? Yes: NPRS scale: 8-10/10 Pain location: low back  Pain description: aching, sometimes sharp  Aggravating factors: activity  Relieving factors: lying down, heat, ice pack  FALLS: Has patient fallen in last 6 months? Yes. Number of falls 4  LIVING ENVIRONMENT: Lives with: lives with their spouse Lives in: 1 level home Stairs: 4 steps, a landing, then 3 more steps with bilat rails.  Has following equipment at home: Dan Humphreys - 2 wheeled, Shower bench, bed side commode, and Grab bars   PLOF: Needs assistance with ADLs.  Pt performs basic self care with set up assist, extra time to manage clothing fasteners, occasional min A on days with worsening Parkinson's symptoms or back pain.    PATIENT GOALS: Pt wants to regain some strength and coordination skills in bilat hands.   OBJECTIVE:  Note: Objective measures were completed at Evaluation unless otherwise noted.  HAND DOMINANCE: Right  ADLs: Overall ADLs: Extensive time required; occasional min A  from spouse Transfers/ambulation related to ADLs: supv-modified indep with RW Eating: difficulty cutting food, struggles with loading food on a fork (uses built up foam handles at times); spouse cuts steak Grooming: electric toothbrush and standard toothbrush, but mostly uses electric; increased difficulty with brushing teeth and shaving (has electric trimmer and regular razor) UB Dressing: magnetic buttons on some shirts; more difficult to button shirts, occasional help with donning a shirt (min A)  LB Dressing: difficulty tying shoe laces, increased time; uses long shoe horn, has a hip kit; occasional assist to don socks  Toileting: modified indep/extra time  Bathing: able to take a shower with supv with tub bench Tub Shower transfers: transfer tub bench Equipment:  Pt does have hip kit and he regularly uses the long shoe horn.  Spouse reports pt may need some review with the other tools.   IADLs: Shopping: Spouse manages Light housekeeping: Spouse manages Meal Prep: Spouse manages Community mobility: Uses RW and requires frequent sitting breaks d/t back pain Medication management: modified Engineer, materials: Extra time for writing checks, with occasional assist from spouse on days when tremors are worse Handwriting: 90% legible, though pt reports this is an exceptional day today, and pt reports he's recently had to rip of checks and have spouse take over d/t poor legibility.  MOBILITY STATUS: Hx of falls; spouse reports pt to have had 4  falls in the last 6 mo  POSTURE COMMENTS:  rounded shoulders, increased thoracic kyphosis, and hx of scoliosis , L shoulder rests significantly lower than R Sitting balance: Supports self with one extremity  ACTIVITY TOLERANCE: Activity tolerance: limited by low back pain   FUNCTIONAL OUTCOME MEASURES: FOTO: TBD  UPPER EXTREMITY ROM:  BUEs WFL  UPPER EXTREMITY MMT:     MMT Right eval Left eval  Shoulder flexion 5 5  Shoulder  abduction 5 5  Shoulder adduction    Shoulder extension    Shoulder internal rotation 4 4  Shoulder external rotation 4 4  Middle trapezius    Lower trapezius    Elbow flexion    Elbow extension    Wrist flexion    Wrist extension    Wrist ulnar deviation    Wrist radial deviation    Wrist pronation    Wrist supination    (Blank rows = not tested)  HAND FUNCTION: Grip strength: Right: 30 lbs; Left: 45 lbs, Lateral pinch: Right: 12 lbs, Left: 5 lbs, and 3 point pinch: Right: 10 lbs, Left: 6 lbs  COORDINATION: 9 Hole Peg test: Right: 33 sec; Left: 41 sec  SENSATION: Not tested  EDEMA: No visible edema  MUSCLE TONE: RUE: Within functional limits and LUE: Within functional limits  COGNITION: Overall cognitive status: Within functional limits for tasks assessed; spouse reports some impulsivity/poor safety awareness contributing to falls at home  VISION: No recent changes; wears glasses for reading Visual history: macular degeneration, hx of cataract removal both eyes  PERCEPTION: Not tested  PRAXIS: Impaired: Motor planning  TODAY'S TREATMENT:                                                                                                                              DATE: 12/17/22  Moist heat applied to low back with support of lumbar pillow; a second pillow wedged between R side and arm rest of chair for midline position and back pain management throughout session.  Therapeutic Ex:  Pt. worked on [employer], and reciprocal motion using the UBE while seated for 9 min. with moderate resistance. Constant monitoring was provided. Pt. performed gross gripping with a gross grip strengthener. Pt. worked on sustaining grip while grasping pegs and reaching at various heights. Pt. Performed BUE  grip strengthening with a gross gripper was set to  17.9# of grip strength resistance. Pt. Worked on pinch strengthening in the left hand for lateral, and 3pt. pinch using yellow, red,  green resistive clips. Pt. worked on placing the clips at various vertical and horizontal angles. Tactile and verbal cues were required for eliciting the desired movement.       PATIENT EDUCATION: Education details: adaptive pens/adapted eating utensils Person educated: Patient and Spouse Education method: Explanation, demo Education comprehension: verbalized understanding  HOME EXERCISE PROGRAM: To be initiated within the next 1-2 sessions  GOALS: Goals reviewed with patient? Yes  SHORT TERM GOALS: Target  date: 01/21/23  Pt will perform HEP with supv for increasing strength and coordination in B hands.  Baseline: Eval: Will review previous HEP and add to as appropriate Goal status: INITIAL  2.  Pt will implement fall prevention strategies with min vc from spouse at least 50% of the time.  Baseline: Spouse reports pt with 4 falls over the last 6 months, noting decreased safety awareness/impulsivity Goal status: INITIAL  LONG TERM GOALS: Target date: 03/04/2023  Pt will increase FOTO score by (TBD) to indicate improvement in perceived functional performance with daily tasks. Baseline: Eval: FOTO to be given next session. Goal status: INITIAL  2.  Pt will utilize AE/activity modification to participate in face time calls with family [place] with set up/supv. Baseline: Eval: Pt reports he is struggling to accurately press buttons on his cell phone, often having spouse assist to manage a call (Initial recommendation at eval to transition to tablet for Face time calls to work with larger screen surface; spouse receptive) Goal status: INITIAL  3.  Pt will use adapted writing aids/utilize positioning strategies/activity modifications to enable pt to take simple notes when reading scripture and to write checks legibly. Baseline: Eval: Pt reports he has not been able to take notes in awhile d/t poor legibility and this task requiring increased effort and fatigue. Goal status:  INITIAL  4.  Pt will increase bilat grip strength by 5 or more lbs to improve ability to open containers.  Baseline: Eval: R grip: 30 lbs, L grip: 45 lbs Goal status: INITIAL  5.  Pt will improve [facility] skills to increase efficiency with manipulation of eating and grooming utensils.   Baseline: Eval: R 33 sec, L 41 sec Goal status: INITIAL  ASSESSMENT:  CLINICAL IMPRESSION:  Pt. reports 6-7/10 pain in th back. Pt. requires right sided lateral support when sitting in the chair during tasks. Pt. tolerated moist heat modality to the back during the session. Pt. tolerated the exercises well, and requires cues for hand  pinch position on the clips, gripper and technique. Pt. continues to benefit from skilled OT to review/progress HEP, review/advise on activity modifications/AE, provide exercises and activities to promote hand strength and coordination skills, and reinforce fall prevention strategies to maximize safety, efficiency, and independence with daily tasks. Pt./spouse continue to be in agreement with poc.   PERFORMANCE DEFICITS: in functional skills including ADLs, IADLs, coordination, dexterity, strength, pain, Fine motor control, Gross motor control, mobility, balance, body mechanics, endurance, decreased knowledge of use of DME, and UE functional use, cognitive skills including memory and safety awareness, and psychosocial skills including coping strategies, environmental adaptation, habits, and routines and behaviors.   IMPAIRMENTS: are limiting patient from ADLs, IADLs, leisure, and social participation.   CO-MORBIDITIES: has co-morbidities such as chronic pain syndrome, spinal stenosis, DDD, dizziness   that affects occupational performance. Patient will benefit from skilled OT to address above impairments and improve overall function.  MODIFICATION OR ASSISTANCE TO COMPLETE EVALUATION: No modification of tasks or assist necessary to complete an evaluation.  OT OCCUPATIONAL PROFILE AND  HISTORY: Problem focused assessment: Including review of records relating to presenting problem.  CLINICAL DECISION MAKING: Moderate - several treatment options, min-mod task modification necessary  REHAB POTENTIAL: Good  EVALUATION COMPLEXITY: Low    PLAN:  OT FREQUENCY: 2x/week  OT DURATION: 12 weeks  PLANNED INTERVENTIONS: 97535 self care/ADL training, 57846 therapeutic exercise, 97530 therapeutic activity, 97112 neuromuscular re-education, 97010 moist heat, 97010 cryotherapy, passive range of motion, balance training, functional mobility training,  visual/perceptual remediation/compensation, psychosocial skills training, energy conservation, coping strategies training, patient/family education, and DME and/or AE instructions  RECOMMENDED OTHER SERVICES: None at this time  CONSULTED AND AGREED WITH PLAN OF CARE: family member/caregiver  PLAN FOR NEXT SESSION: FOTO; see above   Olegario Messier, MS, OTR/L   Olegario Messier, OT 12/17/2022, 4:54 PM

## 2022-12-17 NOTE — Telephone Encounter (Signed)
Called LVM 12/17/2022 to schedule Annual Wellness Visit  Verlee Rossetti; Care Guide Ambulatory Clinical Support Lydia l [facility] Direct Dial: [phone]

## 2022-12-22 ENCOUNTER — Ambulatory Visit: Payer: Medicare HMO

## 2022-12-22 DIAGNOSIS — R2681 Unsteadiness on feet: Secondary | ICD-10-CM | POA: Diagnosis not present

## 2022-12-22 DIAGNOSIS — R2689 Other abnormalities of gait and mobility: Secondary | ICD-10-CM | POA: Diagnosis not present

## 2022-12-22 DIAGNOSIS — R278 Other lack of coordination: Secondary | ICD-10-CM

## 2022-12-22 DIAGNOSIS — R42 Dizziness and giddiness: Secondary | ICD-10-CM | POA: Diagnosis not present

## 2022-12-22 DIAGNOSIS — M5459 Other low back pain: Secondary | ICD-10-CM | POA: Diagnosis not present

## 2022-12-22 DIAGNOSIS — G20C Parkinsonism, unspecified: Secondary | ICD-10-CM

## 2022-12-22 DIAGNOSIS — M6281 Muscle weakness (generalized): Secondary | ICD-10-CM

## 2022-12-22 DIAGNOSIS — R262 Difficulty in walking, not elsewhere classified: Secondary | ICD-10-CM

## 2022-12-22 DIAGNOSIS — R269 Unspecified abnormalities of gait and mobility: Secondary | ICD-10-CM | POA: Diagnosis not present

## 2022-12-22 NOTE — Therapy (Unsigned)
OUTPATIENT OCCUPATIONAL THERAPY NEURO TREATMENT NOTE  Patient Name: Joseph Arroyo MRN: [id number] DOB:Oct 22, 1940, 82 y.o., male Today's Date: 12/22/2022  PCP: Dr. Saralyn Pilar REFERRING PROVIDER: PCP  END OF SESSION:  OT End of Session - 12/22/22 1317     Visit Number 4    Number of Visits 24    Date for OT Re-Evaluation 03/04/23    Progress Note Due on Visit 10    OT Start Time 1315    OT Stop Time 1400    OT Time Calculation (min) 45 min    Equipment Utilized During Treatment RW    Activity Tolerance Patient tolerated treatment well    Behavior During Therapy [facility] for tasks assessed/performed            Past Medical History:  Diagnosis Date   Allergic rhinitis due to pollen 11/21/2007   Brachial neuritis or radiculitis NOS    Cervicalgia    Concussion    age 72 - s/p accident   Costal chondritis    Depression    Essential and other specified forms of tremor    GERD (gastroesophageal reflux disease)    in past   Lumbago    Occlusion and stenosis of carotid artery without mention of cerebral infarction    Seizures [facility])    age 66 - after concussion   Past Surgical History:  Procedure Laterality Date   CATARACT EXTRACTION Right 07/18/14   CATARACT EXTRACTION W/PHACO Left 08/01/2014   Procedure: CATARACT EXTRACTION PHACO AND INTRAOCULAR LENS PLACEMENT (IOC);  Surgeon: Lockie Mola, MD;  Location: [facility];  Service: Ophthalmology;  Laterality: Left;  IVA TOPICAL   COLONOSCOPY  2008   OTHER SURGICAL HISTORY  2002   ear surgery   STAPEDES SURGERY Right 2002   Patient Active Problem List   Diagnosis Date Noted   Dizziness 10/02/2022   Lumbar facet joint pain 06/25/2022   Osteoarthritis of facet joint of lumbar spine 06/25/2022   Lumbosacral facet joint hypertrophy 06/25/2022   Parkinson's disease with dyskinesia (HCC) 01/27/2022   Dysphagia, pharyngeal phase 09/17/2021   Cervicalgia 08/05/2021   Spondylosis without myelopathy  or radiculopathy, lumbosacral region 06/24/2021   Lumbar central spinal stenosis, w/o neurogenic claudication (L4-5) 06/03/2021   Lumbar lateral recess stenosis (Bilateral: L2-3, L4-5) (Right: L3-4) 06/03/2021   Lumbosacral foraminal stenosis (Bilateral: L2-3, L3-4) (Left: L5-S1) 06/03/2021   Lumbar nerve root impingement (Right: L3 at L2-3 & L3-4) 06/03/2021   Ligamentum flavum hypertrophy (L3-4, L4-5) 06/03/2021   Abnormal MRI, lumbar spine (04/23/2021) 04/24/2021   Long term prescription benzodiazepine use (alprazolam) (Xanax) 03/24/2021   Grade 1 Retrolisthesis of L2/L3 (5 mm) and L3/L4 (3 mm) 03/24/2021   Levoscoliosis of lumbar spine (L3-4 apex) 03/24/2021   DDD (degenerative disc disease), lumbosacral 03/24/2021   Lumbosacral facet arthropathy (Left: L3-4, L4-5, and L5-S1) 03/24/2021   Tricompartment osteoarthritis of knee (Left) 03/24/2021   Baker cyst (Left) 03/24/2021   Chronic low back pain (1ry area of Pain) (Bilateral) (R>L) w/o sciatica 03/24/2021   Lumbar facet syndrome (Bilateral) 03/24/2021   Chronic lower extremity pain (2ry area of Pain) (Bilateral) (L>R) 03/24/2021   Lumbosacral radiculitis/sensory radiculopathy at L2 (Bilateral) 03/24/2021   Lumbosacral radiculitis/sensory radiculopathy at L3 (Bilateral) 03/24/2021   Chronic pain syndrome 03/23/2021   Pharmacologic therapy 03/23/2021   Disorder of skeletal system 03/23/2021   Problems influencing health status 03/23/2021   PAD (peripheral artery disease) (HCC) 10/01/2020   GAD (generalized anxiety disorder) 01/13/2018   MDD (major depressive disorder)  01/13/2018   Umbilical hernia 09/04/2017   Chronic knee pain (Left) 08/11/2017   Derangement of medial meniscus, posterior horn (Left) 08/11/2017   Vitamin D insufficiency 03/05/2016   BPH without obstruction/lower urinary tract symptoms 07/08/2015   Chronic fatigue 10/23/2014   Major depression, recurrent, full remission (HCC) 07/26/2013   Unsteady gait 07/26/2013    Orthostatic hypotension 07/26/2013   Depression 07/26/2013   Anxiety 06/23/2013   Chronic anxiety 06/23/2013   Tremor 05/18/2013   Bilateral carotid artery stenosis 08/30/2012   ONSET DATE: 2015  REFERRING DIAG: Parkinson's Disease  THERAPY DIAG:  Muscle weakness (generalized)  Other lack of coordination  Atypical parkinsonism (HCC)  Rationale for Evaluation and Treatment: Rehabilitation  SUBJECTIVE:  SUBJECTIVE STATEMENT: Pt verbalized excitement over being able to participate in early voting yesterday.  Pt reports he was able to do curb side voting d/t limited standing tolerance.  Pt accompanied by: significant other, spouse, [name]  PERTINENT HISTORY: Hx of Parkinson's disease (HCC); Chronic knee pain (Left); Derangement of medial meniscus, posterior horn (Left); PAD (peripheral artery disease) (HCC); Chronic pain syndrome; DDD (degenerative disc disease) lumbosacral; osteoarthritis of knee (Left); Chronic low back pain, Lumbar facet syndrome (Bilateral); Chronic lower extremity pain.   PRECAUTIONS: Fall  WEIGHT BEARING RESTRICTIONS: No  PAIN:  Are you having pain? Yes: NPRS scale: 8-10/10 Pain location: low back  Pain description: aching, sometimes sharp  Aggravating factors: activity  Relieving factors: lying down, heat, ice pack  FALLS: Has patient fallen in last 6 months? Yes. Number of falls 4  LIVING ENVIRONMENT: Lives with: lives with their spouse Lives in: 1 level home Stairs: 4 steps, a landing, then 3 more steps with bilat rails.  Has following equipment at home: Dan Humphreys - 2 wheeled, Shower bench, bed side commode, and Grab bars   PLOF: Needs assistance with ADLs.  Pt performs basic self care with set up assist, extra time to manage clothing fasteners, occasional min A on days with worsening Parkinson's symptoms or back pain.    PATIENT GOALS: Pt wants to regain some strength and coordination skills in bilat hands.   OBJECTIVE:  Note: Objective measures  were completed at Evaluation unless otherwise noted.  HAND DOMINANCE: Right  ADLs: Overall ADLs: Extensive time required; occasional min A from spouse Transfers/ambulation related to ADLs: supv-modified indep with RW Eating: difficulty cutting food, struggles with loading food on a fork (uses built up foam handles at times); spouse cuts steak Grooming: electric toothbrush and standard toothbrush, but mostly uses electric; increased difficulty with brushing teeth and shaving (has electric trimmer and regular razor) UB Dressing: magnetic buttons on some shirts; more difficult to button shirts, occasional help with donning a shirt (min A)  LB Dressing: difficulty tying shoe laces, increased time; uses long shoe horn, has a hip kit; occasional assist to don socks  Toileting: modified indep/extra time  Bathing: able to take a shower with supv with tub bench Tub Shower transfers: transfer tub bench Equipment:  Pt does have hip kit and he regularly uses the long shoe horn.  Spouse reports pt may need some review with the other tools.   IADLs: Shopping: Spouse manages Light housekeeping: Spouse manages Meal Prep: Spouse manages Community mobility: Uses RW and requires frequent sitting breaks d/t back pain Medication management: modified Engineer, materials: Extra time for writing checks, with occasional assist from spouse on days when tremors are worse Handwriting: 90% legible, though pt reports this is an exceptional day today, and pt reports he's recently  had to rip of checks and have spouse take over d/t poor legibility.  MOBILITY STATUS: Hx of falls; spouse reports pt to have had 4 falls in the last 6 mo  POSTURE COMMENTS:  rounded shoulders, increased thoracic kyphosis, and hx of scoliosis , L shoulder rests significantly lower than R Sitting balance: Supports self with one extremity  ACTIVITY TOLERANCE: Activity tolerance: limited by low back pain   FUNCTIONAL OUTCOME  MEASURES: FOTO: TBD  UPPER EXTREMITY ROM:  BUEs WFL  UPPER EXTREMITY MMT:     MMT Right eval Left eval  Shoulder flexion 5 5  Shoulder abduction 5 5  Shoulder adduction    Shoulder extension    Shoulder internal rotation 4 4  Shoulder external rotation 4 4  Middle trapezius    Lower trapezius    Elbow flexion    Elbow extension    Wrist flexion    Wrist extension    Wrist ulnar deviation    Wrist radial deviation    Wrist pronation    Wrist supination    (Blank rows = not tested)  HAND FUNCTION: Grip strength: Right: 30 lbs; Left: 45 lbs, Lateral pinch: Right: 12 lbs, Left: 5 lbs, and 3 point pinch: Right: 10 lbs, Left: 6 lbs  COORDINATION: 9 Hole Peg test: Right: 33 sec; Left: 41 sec  SENSATION: Not tested  EDEMA: No visible edema  MUSCLE TONE: RUE: Within functional limits and LUE: Within functional limits  COGNITION: Overall cognitive status: Within functional limits for tasks assessed; spouse reports some impulsivity/poor safety awareness contributing to falls at home  VISION: No recent changes; wears glasses for reading Visual history: macular degeneration, hx of cataract removal both eyes  PERCEPTION: Not tested  PRAXIS: Impaired: Motor planning  TODAY'S TREATMENT:                                                                                                                              DATE: 12/22/22 Moist heat applied to low back with support of lumbar pillow; a second pillow wedged between R side and arm rest of chair for midline position and back pain management throughout session.  Therapeutic Ex: Facilitated hand strengthening with use of hand gripper set at 17.9# for 1 trial, progressing to 23.4# for 2 more trials to remove jumbo pegs from pegboard x3 trials using R hand.   Neuro re-ed: Participated in coin manipulation activities with coin pick up from table, storage of coins within palm of hand, transferring coins from palm of hand to  fingertips to enable discarding coins one at a time without dropping, stacking coins, and placing coins into a slotted container using R hand.  Reviewed [facility] exercise program with review of handout with suggestions for [facility] activities in the home using household objects (ie unscrewing nut/bolts, handwriting exercises, coin/marble manipulation activities, digit isolation exercises, etc).    Self Care: -Reviewed activity modification recommendations for making/receiving phone calls, including using phone stand to  avoid pressing buttons accidentally which pt reports occurs when he is holding his phone and talking.  Also recommended using ipad for the larger screen (as compared to a cell phone) to likely reduce errors with pressing buttons.  Encouraged spouse input commonly dialed contacts into ipad this week to begin trial of making/receiving calls with the ipad.  Pt/spouse receptive.   - Issued elastic laces and assisted with threading shoe laces.  Encouraged pt adjust tension as needed to ensure laces are loose enough to slide shoes on with a long shoe horn, but tight enough to keep heel from slipping up while walking.  Advised on alternate elastic laces with locks/bungees for athletic shoes and options to obtain.    PATIENT EDUCATION: Education details: activity modification; elastic shoe laces, FMC exercises Person educated: Patient and Spouse Education method: Explanation, demo, written handout for [facility] exercises Education comprehension: verbalized understanding  HOME EXERCISE PROGRAM: Pt instructed to use his theraputty or his hand strengthener (spring based), [facility] exercises (handout issued)  GOALS: Goals reviewed with patient? Yes  SHORT TERM GOALS: Target date: 01/21/23  Pt will perform HEP with supv for increasing strength and coordination in B hands.  Baseline: Eval: Will review previous HEP and add to as appropriate Goal status: INITIAL  2.  Pt will implement fall prevention strategies  with min vc from spouse at least 50% of the time.  Baseline: Spouse reports pt with 4 falls over the last 6 months, noting decreased safety awareness/impulsivity Goal status: INITIAL  LONG TERM GOALS: Target date: 03/04/2023  Pt will increase FOTO score by (TBD) to indicate improvement in perceived functional performance with daily tasks. Baseline: Eval: FOTO to be given next session. Goal status: INITIAL  2.  Pt will utilize AE/activity modification to participate in face time calls with family [place] with set up/supv. Baseline: Eval: Pt reports he is struggling to accurately press buttons on his cell phone, often having spouse assist to manage a call (Initial recommendation at eval to transition to tablet for Face time calls to work with larger screen surface; spouse receptive) Goal status: INITIAL  3.  Pt will use adapted writing aids/utilize positioning strategies/activity modifications to enable pt to take simple notes when reading scripture and to write checks legibly. Baseline: Eval: Pt reports he has not been able to take notes in awhile d/t poor legibility and this task requiring increased effort and fatigue. Goal status: INITIAL  4.  Pt will increase bilat grip strength by 5 or more lbs to improve ability to open containers.  Baseline: Eval: R grip: 30 lbs, L grip: 45 lbs Goal status: INITIAL  5.  Pt will improve [facility] skills to increase efficiency with manipulation of eating and grooming utensils.   Baseline: Eval: R 33 sec, L 41 sec Goal status: INITIAL  ASSESSMENT:  CLINICAL IMPRESSION: Pt with good tolerance to R hand strengthening and coordination exercises this date.  Pt admits to no current routine for hand strengthening and coordination training at home.  OT reviewed and issued [facility] exercise handout this date and encouraged pt restart these exercises, as well as using his theraputty and/or his hand strengthener (spring loaded) to address weakness and coordination deficits  in R hand.  Pt verbalized understanding.  Reviewed activity modifications for placing/receiving phone calls, and encouraged pt/spouse begin trial of using spouse's ipad for the benefit of the larger screen, anticipating this to improve accuracy with hitting necessary buttons.  Pt/spouse receptive.  Elastic laces issued to ease donning shoes.  Pt. continues to benefit from skilled OT to review/progress HEP, review/advise on activity modifications/AE, provide exercises and activities to promote hand strength and coordination skills, and reinforce fall prevention strategies to maximize safety, efficiency, and independence with daily tasks. Pt./spouse continue to be in agreement with poc.   PERFORMANCE DEFICITS: in functional skills including ADLs, IADLs, coordination, dexterity, strength, pain, Fine motor control, Gross motor control, mobility, balance, body mechanics, endurance, decreased knowledge of use of DME, and UE functional use, cognitive skills including memory and safety awareness, and psychosocial skills including coping strategies, environmental adaptation, habits, and routines and behaviors.   IMPAIRMENTS: are limiting patient from ADLs, IADLs, leisure, and social participation.   CO-MORBIDITIES: has co-morbidities such as chronic pain syndrome, spinal stenosis, DDD, dizziness   that affects occupational performance. Patient will benefit from skilled OT to address above impairments and improve overall function.  MODIFICATION OR ASSISTANCE TO COMPLETE EVALUATION: No modification of tasks or assist necessary to complete an evaluation.  OT OCCUPATIONAL PROFILE AND HISTORY: Problem focused assessment: Including review of records relating to presenting problem.  CLINICAL DECISION MAKING: Moderate - several treatment options, min-mod task modification necessary  REHAB POTENTIAL: Good  EVALUATION COMPLEXITY: Low    PLAN:  OT FREQUENCY: 2x/week  OT DURATION: 12 weeks  PLANNED  INTERVENTIONS: 97535 self care/ADL training, 16109 therapeutic exercise, 97530 therapeutic activity, 97112 neuromuscular re-education, 97010 moist heat, 97010 cryotherapy, passive range of motion, balance training, functional mobility training, visual/perceptual remediation/compensation, psychosocial skills training, energy conservation, coping strategies training, patient/family education, and DME and/or AE instructions  RECOMMENDED OTHER SERVICES: None at this time  CONSULTED AND AGREED WITH PLAN OF CARE: family member/caregiver  PLAN FOR NEXT SESSION: FOTO; see above   Danelle Earthly, MS, OTR/L  Otis Dials, OT 12/22/2022, 4:15 PM

## 2022-12-22 NOTE — Therapy (Signed)
OUTPATIENT PHYSICAL THERAPY NEURO TREATMENT      Patient Name: Joseph Arroyo MRN: [id number] DOB:1941-02-13, 82 y.o., male Today's Date: 12/22/2022  END OF SESSION:  PT End of Session - 12/22/22 1446     Visit Number 14    Number of Visits 33    Date for PT Re-Evaluation 02/23/23    Authorization Type [employer]    Progress Note Due on Visit 10    PT Start Time 1401    PT Stop Time 1443    PT Time Calculation (min) 42 min    Equipment Utilized During Treatment Gait belt    Activity Tolerance Patient tolerated treatment well;Patient limited by pain    Behavior During Therapy WFL for tasks assessed/performed                     Past Medical History:  Diagnosis Date   Allergic rhinitis due to pollen 11/21/2007   Brachial neuritis or radiculitis NOS    Cervicalgia    Concussion    age 84 - s/p accident   Costal chondritis    Depression    Essential and other specified forms of tremor    GERD (gastroesophageal reflux disease)    in past   Lumbago    Occlusion and stenosis of carotid artery without mention of cerebral infarction    Seizures (HCC)    age 35 - after concussion   Past Surgical History:  Procedure Laterality Date   CATARACT EXTRACTION Right 07/18/14   CATARACT EXTRACTION W/PHACO Left 08/01/2014   Procedure: CATARACT EXTRACTION PHACO AND INTRAOCULAR LENS PLACEMENT (IOC);  Surgeon: Lockie Mola, MD;  Location: [facility];  Service: Ophthalmology;  Laterality: Left;  IVA TOPICAL   COLONOSCOPY  2008   OTHER SURGICAL HISTORY  2002   ear surgery   STAPEDES SURGERY Right 2002   Patient Active Problem List   Diagnosis Date Noted   Dizziness 10/02/2022   Lumbar facet joint pain 06/25/2022   Osteoarthritis of facet joint of lumbar spine 06/25/2022   Lumbosacral facet joint hypertrophy 06/25/2022   Parkinson's disease with dyskinesia (HCC) 01/27/2022   Dysphagia, pharyngeal phase 09/17/2021   Cervicalgia 08/05/2021    Spondylosis without myelopathy or radiculopathy, lumbosacral region 06/24/2021   Lumbar central spinal stenosis, w/o neurogenic claudication (L4-5) 06/03/2021   Lumbar lateral recess stenosis (Bilateral: L2-3, L4-5) (Right: L3-4) 06/03/2021   Lumbosacral foraminal stenosis (Bilateral: L2-3, L3-4) (Left: L5-S1) 06/03/2021   Lumbar nerve root impingement (Right: L3 at L2-3 & L3-4) 06/03/2021   Ligamentum flavum hypertrophy (L3-4, L4-5) 06/03/2021   Abnormal MRI, lumbar spine (04/23/2021) 04/24/2021   Long term prescription benzodiazepine use (alprazolam) (Xanax) 03/24/2021   Grade 1 Retrolisthesis of L2/L3 (5 mm) and L3/L4 (3 mm) 03/24/2021   Levoscoliosis of lumbar spine (L3-4 apex) 03/24/2021   DDD (degenerative disc disease), lumbosacral 03/24/2021   Lumbosacral facet arthropathy (Left: L3-4, L4-5, and L5-S1) 03/24/2021   Tricompartment osteoarthritis of knee (Left) 03/24/2021   Baker cyst (Left) 03/24/2021   Chronic low back pain (1ry area of Pain) (Bilateral) (R>L) w/o sciatica 03/24/2021   Lumbar facet syndrome (Bilateral) 03/24/2021   Chronic lower extremity pain (2ry area of Pain) (Bilateral) (L>R) 03/24/2021   Lumbosacral radiculitis/sensory radiculopathy at L2 (Bilateral) 03/24/2021   Lumbosacral radiculitis/sensory radiculopathy at L3 (Bilateral) 03/24/2021   Chronic pain syndrome 03/23/2021   Pharmacologic therapy 03/23/2021   Disorder of skeletal system 03/23/2021   Problems influencing health status 03/23/2021   PAD (peripheral artery disease) (  HCC) 10/01/2020   GAD (generalized anxiety disorder) 01/13/2018   MDD (major depressive disorder) 01/13/2018   Umbilical hernia 09/04/2017   Chronic knee pain (Left) 08/11/2017   Derangement of medial meniscus, posterior horn (Left) 08/11/2017   Vitamin D insufficiency 03/05/2016   BPH without obstruction/lower urinary tract symptoms 07/08/2015   Chronic fatigue 10/23/2014   Major depression, recurrent, full remission (HCC)  07/26/2013   Unsteady gait 07/26/2013   Orthostatic hypotension 07/26/2013   Depression 07/26/2013   Anxiety 06/23/2013   Chronic anxiety 06/23/2013   Tremor 05/18/2013   Bilateral carotid artery stenosis 08/30/2012    PCP: Smitty Cords, DO  REFERRING PROVIDER:   Mecum, Oswaldo Conroy, PA-C    REFERRING DIAG: R42 (ICD-10-CM) - Dizziness   THERAPY DIAG:  Muscle weakness (generalized)  Unsteadiness on feet  Other lack of coordination  Difficulty in walking, not elsewhere classified  ONSET DATE: August 1st 2024  Rationale for Evaluation and Treatment: Rehabilitation  SUBJECTIVE:   SUBJECTIVE STATEMENT Pt reports back pain is 4/10 currently. Dizziness has remained unchanged.  Pt accompanied by: significant other   PERTINENT HISTORY:  The pt is a pleasant 82 yo male referred to PT for dizziness. Pt known to clinic, previously seen for falls in the setting of PD.  Pt spouse present for eval and reports pt was seen by neurologist recently at [facility] following onset of dizziness about two-three weeks ago. He reports they think he has BPPV.  PT spouse reports he had blood work done at this visit which "didn't show anything" and was normal. Pt unsure if his symptoms come on when he is still at rest. He describes his symptoms as spinning. This can be triggered when he lies on his back and also when he sits up from supine. Spinning lasts for minutes. He reports no nausea.  Pt sometimes sees double when he looks to the side and this has been going on for weeks. Denies any one sided weakness or other changes. He has fallen 4 times in past 3 months. Pt spouse reports pt had a fall about a week prior to dizziness onset. Pt reports no neck pain, has hx of LBP. His spouse reports decline in his mobility since this started. Pt uses RW at baseline for ambulation. Pt now being seen primarily for PD impairments. Pt PMH significant for Parkinson's disease, concussion, cervicalgia, depression,  seizures, chronic L knee pain, chronic anxiety, DDD, PAD, chronic pain syndrome, chronic low back pain, please refer to chart for full, extensive, PMH. PAIN:  Are you having pain? No Back pain hurting prior, took 3 ibuprophen.   PRECAUTIONS: Fall   WEIGHT BEARING RESTRICTIONS: No  FALLS: Has patient fallen in last 6 months? Yes. Number of falls 4  LIVING ENVIRONMENT: via chart and confirmed by pt in session Lives with: lives with their spouse Lives in: House/apartment Stairs: 4 Has following equipment at home: Walker - 2 wheeled  PLOF:  needs assistance, pt with PD that affected his mobility/ADLs  PATIENT GOALS: get rid of dizziness  OBJECTIVE:     TREATMENT:  DATE: 12/22/22   NMR:  Supine>seated on mat table for habituation 6x  no symptoms present      TE:  Seated: 1.5# aw each LE- -march 10x each LE; 3 sets  -PF 2x20 bilat -DF 2x20 bilat  -Amb with RW 4x10 meters - rates medium -LAQ 15x each LE 2 sets  Pt rates pain level 4-5  STS 5x Seated adductor squeeze with pball 3x10 with 3 sec hold/rep      PATIENT EDUCATION: Education details: exercise technique  HOME EXERCISE PROGRAM: Updated: Access Code: [email or web address] URL: [email or web address] Date: 12/08/2022 Prepared by: Temple Pacini  Exercises - Seated March  - 1 x daily - 5-6 x weekly - 4 sets - 10 reps - Seated Long Arc Quad  - 1 x daily - 5-6 x weekly - 2 sets - 15 reps - Seated Heel Toe Raises  - 1 x daily - 5-6 x weekly - 2 sets - 20 reps  GOALS:   Goals reviewed with patient? Yes   SHORT TERM GOALS: Target date: 02/02/2023   Patient will be independent in home exercise program to improve dizziness, balance and mobility for better safety and functional independence with ADLs. Baseline: to be initiated future visit/as needed Goal status: INITIAL   LONG TERM GOALS: Target date:  03/16/2023    Patient will increase FOTO score to equal to or greater than 53    to demonstrate statistically significant improvement in mobility and quality of life.  Baseline: 44 Goal status: INITIAL  2.  Patient will reduce dizziness handicap inventory score to <50, for less dizziness with ADLs and increased safety with home and work tasks.  Baseline: deferred, will complete as pt able: 10/10: 48, still has difficulty with bed mobility  Goal status: MET  3.  The pt will report at least a 60% decrease in positional or movement triggered dizziness symptoms in order to indicate decreased fall risk and increased balance/safety with mobility.  Baseline: pt presents with decreased mobility due to dizziness, dizziness/vertigo triggered by particular positional; changes; 10/10: 60-70%  Goal status: MET  4.  Patient will reduce timed up and go to <11 seconds to reduce fall risk and demonstrate improved transfer/gait ability.  Baseline: 9/5: 18 sec; 10/10: 16 seconds seconds  Goal status: INITIAL  5.Patient (> 2 years old) will complete five times sit to stand test in < 15 seconds indicating an increased LE strength and improved balance. Baseline: 12/01/22: 26 sec Goal status: NEW  6. Patient will increase 10 meter walk test to >1.42m/s as to improve gait speed for better community ambulation and to reduce fall risk. Baseline: 12/01/22: 0.6 m/s with RW Goal status: NEW   ASSESSMENT:  CLINICAL IMPRESSION: Pt able to advance interventions with increased weights today, indicating improved BLE strength/activity tolerance. He tolerated this fair, with only slight increase in back pain from baseline. Remaining exercises were modified to seated to prevent further increase in LBP.  The pt will benefit from further skilled PT to address these deficits in order to decrease fall risk, dizziness and improve balance/QOL.     OBJECTIVE IMPAIRMENTS: Abnormal gait, decreased activity tolerance, decreased  balance, decreased coordination, decreased mobility, difficulty walking, decreased strength, dizziness, impaired vision/preception, improper body mechanics, and postural dysfunction.   ACTIVITY LIMITATIONS: lifting, bending, squatting, stairs, transfers, bed mobility, toileting, dressing, hygiene/grooming, and locomotion level  PARTICIPATION LIMITATIONS: meal prep, cleaning, laundry, shopping, community activity, and yard work  PERSONAL FACTORS: Age, Fitness, and 3+ comorbidities: Pt PMH significant for Parkinson's  disease, concussion, cervicalgia, depression, seizures, chronic L knee pain, chronic anxiety, DDD, PAD, chronic pain syndrome, chronic low back pain, please refer to chart for full, extensive, PMH.  are also affecting patient's functional outcome.   REHAB POTENTIAL: Good  CLINICAL DECISION MAKING: Evolving/moderate complexity  EVALUATION COMPLEXITY: Moderate   PLAN:  PT FREQUENCY: 1-2x/week  PT DURATION: 12 weeks  PLANNED INTERVENTIONS: Therapeutic exercises, Therapeutic activity, Neuromuscular re-education, Balance training, Gait training, Patient/Family education, Self Care, Joint mobilization, Stair training, Vestibular training, Canalith repositioning, Visual/preceptual remediation/compensation, DME instructions, Electrical stimulation, Wheelchair mobility training, Spinal mobilization, Cryotherapy, Moist heat, Splintting, Taping, Traction, Manual therapy, and Re-evaluation  PLAN FOR NEXT SESSION: attempt standing/supported PD interventions,  bed mobility (supine>seated) as able    Baird Kay, PT 12/22/2022, 2:50 PM  2:50 PM, 12/22/22   2:50 PM, 12/22/22  Physical Therapist - [facility]  [phone] [facility])

## 2022-12-24 ENCOUNTER — Ambulatory Visit: Payer: Medicare HMO

## 2022-12-24 DIAGNOSIS — R278 Other lack of coordination: Secondary | ICD-10-CM | POA: Diagnosis not present

## 2022-12-24 DIAGNOSIS — R2681 Unsteadiness on feet: Secondary | ICD-10-CM

## 2022-12-24 DIAGNOSIS — M5459 Other low back pain: Secondary | ICD-10-CM | POA: Diagnosis not present

## 2022-12-24 DIAGNOSIS — R269 Unspecified abnormalities of gait and mobility: Secondary | ICD-10-CM

## 2022-12-24 DIAGNOSIS — R42 Dizziness and giddiness: Secondary | ICD-10-CM | POA: Diagnosis not present

## 2022-12-24 DIAGNOSIS — R262 Difficulty in walking, not elsewhere classified: Secondary | ICD-10-CM

## 2022-12-24 DIAGNOSIS — M6281 Muscle weakness (generalized): Secondary | ICD-10-CM

## 2022-12-24 DIAGNOSIS — R2689 Other abnormalities of gait and mobility: Secondary | ICD-10-CM | POA: Diagnosis not present

## 2022-12-24 DIAGNOSIS — G20C Parkinsonism, unspecified: Secondary | ICD-10-CM

## 2022-12-24 NOTE — Therapy (Signed)
OUTPATIENT OCCUPATIONAL THERAPY NEURO TREATMENT NOTE  Patient Name: Joseph Arroyo MRN: [id number] DOB:1940-07-24, 82 y.o., male Today's Date: 12/27/2022  PCP: Dr. Saralyn Pilar REFERRING PROVIDER: PCP  END OF SESSION:  OT End of Session - 12/24/22 1418       Visit Number 5     Number of Visits 24     Date for OT Re-Evaluation 03/04/23     Progress Note Due on Visit 10     OT Start Time 1400     OT Stop Time 1445     OT Time Calculation (min) 45 min     Equipment Utilized During Treatment RW     Activity Tolerance Patient tolerated treatment well     Behavior During Therapy [facility] for tasks assessed/performed    Past Medical History:  Diagnosis Date   Allergic rhinitis due to pollen 11/21/2007   Brachial neuritis or radiculitis NOS    Cervicalgia    Concussion    age 49 - s/p accident   Costal chondritis    Depression    Essential and other specified forms of tremor    GERD (gastroesophageal reflux disease)    in past   Lumbago    Occlusion and stenosis of carotid artery without mention of cerebral infarction    Seizures [facility])    age 34 - after concussion   Past Surgical History:  Procedure Laterality Date   CATARACT EXTRACTION Right 07/18/14   CATARACT EXTRACTION W/PHACO Left 08/01/2014   Procedure: CATARACT EXTRACTION PHACO AND INTRAOCULAR LENS PLACEMENT (IOC);  Surgeon: Lockie Mola, MD;  Location: [facility];  Service: Ophthalmology;  Laterality: Left;  IVA TOPICAL   COLONOSCOPY  2008   OTHER SURGICAL HISTORY  2002   ear surgery   STAPEDES SURGERY Right 2002   Patient Active Problem List   Diagnosis Date Noted   Dizziness 10/02/2022   Lumbar facet joint pain 06/25/2022   Osteoarthritis of facet joint of lumbar spine 06/25/2022   Lumbosacral facet joint hypertrophy 06/25/2022   Parkinson's disease with dyskinesia (HCC) 01/27/2022   Dysphagia, pharyngeal phase 09/17/2021   Cervicalgia 08/05/2021   Spondylosis without myelopathy or  radiculopathy, lumbosacral region 06/24/2021   Lumbar central spinal stenosis, w/o neurogenic claudication (L4-5) 06/03/2021   Lumbar lateral recess stenosis (Bilateral: L2-3, L4-5) (Right: L3-4) 06/03/2021   Lumbosacral foraminal stenosis (Bilateral: L2-3, L3-4) (Left: L5-S1) 06/03/2021   Lumbar nerve root impingement (Right: L3 at L2-3 & L3-4) 06/03/2021   Ligamentum flavum hypertrophy (L3-4, L4-5) 06/03/2021   Abnormal MRI, lumbar spine (04/23/2021) 04/24/2021   Long term prescription benzodiazepine use (alprazolam) (Xanax) 03/24/2021   Grade 1 Retrolisthesis of L2/L3 (5 mm) and L3/L4 (3 mm) 03/24/2021   Levoscoliosis of lumbar spine (L3-4 apex) 03/24/2021   DDD (degenerative disc disease), lumbosacral 03/24/2021   Lumbosacral facet arthropathy (Left: L3-4, L4-5, and L5-S1) 03/24/2021   Tricompartment osteoarthritis of knee (Left) 03/24/2021   Baker cyst (Left) 03/24/2021   Chronic low back pain (1ry area of Pain) (Bilateral) (R>L) w/o sciatica 03/24/2021   Lumbar facet syndrome (Bilateral) 03/24/2021   Chronic lower extremity pain (2ry area of Pain) (Bilateral) (L>R) 03/24/2021   Lumbosacral radiculitis/sensory radiculopathy at L2 (Bilateral) 03/24/2021   Lumbosacral radiculitis/sensory radiculopathy at L3 (Bilateral) 03/24/2021   Chronic pain syndrome 03/23/2021   Pharmacologic therapy 03/23/2021   Disorder of skeletal system 03/23/2021   Problems influencing health status 03/23/2021   PAD (peripheral artery disease) (HCC) 10/01/2020   GAD (generalized anxiety disorder) 01/13/2018   MDD (  major depressive disorder) 01/13/2018   Umbilical hernia 09/04/2017   Chronic knee pain (Left) 08/11/2017   Derangement of medial meniscus, posterior horn (Left) 08/11/2017   Vitamin D insufficiency 03/05/2016   BPH without obstruction/lower urinary tract symptoms 07/08/2015   Chronic fatigue 10/23/2014   Major depression, recurrent, full remission (HCC) 07/26/2013   Unsteady gait 07/26/2013    Orthostatic hypotension 07/26/2013   Depression 07/26/2013   Anxiety 06/23/2013   Chronic anxiety 06/23/2013   Tremor 05/18/2013   Bilateral carotid artery stenosis 08/30/2012   ONSET DATE: 2015  REFERRING DIAG: Parkinson's Disease  THERAPY DIAG:  Muscle weakness (generalized)  Other lack of coordination  Atypical parkinsonism (HCC)  Rationale for Evaluation and Treatment: Rehabilitation  SUBJECTIVE:  SUBJECTIVE STATEMENT: Pt requests moist heat for back pain again today. Pt accompanied by: significant other, spouse, [name]  PERTINENT HISTORY: Hx of Parkinson's disease (HCC); Chronic knee pain (Left); Derangement of medial meniscus, posterior horn (Left); PAD (peripheral artery disease) (HCC); Chronic pain syndrome; DDD (degenerative disc disease) lumbosacral; osteoarthritis of knee (Left); Chronic low back pain, Lumbar facet syndrome (Bilateral); Chronic lower extremity pain.   PRECAUTIONS: Fall  WEIGHT BEARING RESTRICTIONS: No  PAIN:  Are you having pain? Yes: NPRS scale: 8-10/10 Pain location: low back  Pain description: aching, sometimes sharp  Aggravating factors: activity  Relieving factors: lying down, heat, ice pack  FALLS: Has patient fallen in last 6 months? Yes. Number of falls 4  LIVING ENVIRONMENT: Lives with: lives with their spouse Lives in: 1 level home Stairs: 4 steps, a landing, then 3 more steps with bilat rails.  Has following equipment at home: Dan Humphreys - 2 wheeled, Shower bench, bed side commode, and Grab bars   PLOF: Needs assistance with ADLs.  Pt performs basic self care with set up assist, extra time to manage clothing fasteners, occasional min A on days with worsening Parkinson's symptoms or back pain.    PATIENT GOALS: Pt wants to regain some strength and coordination skills in bilat hands.   OBJECTIVE:  Note: Objective measures were completed at Evaluation unless otherwise noted.  HAND DOMINANCE: Right  ADLs: Overall ADLs: Extensive  time required; occasional min A from spouse Transfers/ambulation related to ADLs: supv-modified indep with RW Eating: difficulty cutting food, struggles with loading food on a fork (uses built up foam handles at times); spouse cuts steak Grooming: electric toothbrush and standard toothbrush, but mostly uses electric; increased difficulty with brushing teeth and shaving (has electric trimmer and regular razor) UB Dressing: magnetic buttons on some shirts; more difficult to button shirts, occasional help with donning a shirt (min A)  LB Dressing: difficulty tying shoe laces, increased time; uses long shoe horn, has a hip kit; occasional assist to don socks  Toileting: modified indep/extra time  Bathing: able to take a shower with supv with tub bench Tub Shower transfers: transfer tub bench Equipment:  Pt does have hip kit and he regularly uses the long shoe horn.  Spouse reports pt may need some review with the other tools.   IADLs: Shopping: Spouse manages Light housekeeping: Spouse manages Meal Prep: Spouse manages Community mobility: Uses RW and requires frequent sitting breaks d/t back pain Medication management: modified Engineer, materials: Extra time for writing checks, with occasional assist from spouse on days when tremors are worse Handwriting: 90% legible, though pt reports this is an exceptional day today, and pt reports he's recently had to rip of checks and have spouse take over d/t poor legibility.  MOBILITY STATUS:  Hx of falls; spouse reports pt to have had 4 falls in the last 6 mo  POSTURE COMMENTS:  rounded shoulders, increased thoracic kyphosis, and hx of scoliosis , L shoulder rests significantly lower than R Sitting balance: Supports self with one extremity  ACTIVITY TOLERANCE: Activity tolerance: limited by low back pain   FUNCTIONAL OUTCOME MEASURES: FOTO: TBD  UPPER EXTREMITY ROM:  BUEs WFL  UPPER EXTREMITY MMT:     MMT Right eval Left eval   Shoulder flexion 5 5  Shoulder abduction 5 5  Shoulder adduction    Shoulder extension    Shoulder internal rotation 4 4  Shoulder external rotation 4 4  Middle trapezius    Lower trapezius    Elbow flexion    Elbow extension    Wrist flexion    Wrist extension    Wrist ulnar deviation    Wrist radial deviation    Wrist pronation    Wrist supination    (Blank rows = not tested)  HAND FUNCTION: Grip strength: Right: 30 lbs; Left: 45 lbs, Lateral pinch: Right: 12 lbs, Left: 5 lbs, and 3 point pinch: Right: 10 lbs, Left: 6 lbs  COORDINATION: 9 Hole Peg test: Right: 33 sec; Left: 41 sec  SENSATION: Not tested  EDEMA: No visible edema  MUSCLE TONE: RUE: Within functional limits and LUE: Within functional limits  COGNITION: Overall cognitive status: Within functional limits for tasks assessed; spouse reports some impulsivity/poor safety awareness contributing to falls at home  VISION: No recent changes; wears glasses for reading Visual history: macular degeneration, hx of cataract removal both eyes  PERCEPTION: Not tested  PRAXIS: Impaired: Motor planning  TODAY'S TREATMENT:                                                                                                                              DATE: 12/24/22 Moist heat applied to low back with support of lumbar pillow; a second pillow wedged between R side and arm rest of chair for midline position and back pain management throughout session.  Therapeutic Ex: Facilitated pinch strengthening with use of therapy resistant clothespins to target lateral and 3 point pinch of R/L hands.  1# wrist weights applied to each hand to promote shoulder strengthening while pt worked with all clothespin coloros (yellow-black), moving pins on/off vertical and horizontal dowels.  Dowels positioned to challenge forward and lateral reaching at and above shoulder level on each arm.  Pt completed 2 reps on each arm for each pinch pattern and  color.  Neuro re-ed: To promote R/L hand FMC/dexterity skills, pt picked up 1/2"-1" washers from resistive magnetic dish and placed over vertical dowels to target small item pick up and reaching toward a target.  Increased challenge with pt reaching with 1# wrist weights on each wrist.  Progressed to focus on storage and translatory movements, working to pick up washers 1 by 1, store up to 5 in hand, then practiced placing  washers 1 by 1 on vertical and horizontal dowels, and removing with same technique.   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 Yes  SHORT TERM GOALS: Target date: 01/21/23  Pt will perform HEP with supv for increasing strength and coordination in B hands.  Baseline: Eval: Will review previous HEP and add to as appropriate Goal status: INITIAL  2.  Pt will implement fall prevention strategies with min vc from spouse at least 50% of the time.  Baseline: Spouse reports pt with 4 falls over the last 6 months, noting decreased safety awareness/impulsivity Goal status: INITIAL  LONG TERM GOALS: Target date: 03/04/2023  Pt will increase FOTO score by (TBD) to indicate improvement in perceived functional performance with daily tasks. Baseline: Eval: FOTO to be given next session. Goal status: INITIAL  2.  Pt will utilize AE/activity modification to participate in face time calls with family [place] with set up/supv. Baseline: Eval: Pt reports he is struggling to accurately press buttons on his cell phone, often having spouse assist to manage a call (Initial recommendation at eval to transition to tablet for Face time  calls to work with larger screen surface; spouse receptive) Goal status: INITIAL  3.  Pt will use adapted writing aids/utilize positioning strategies/activity modifications to enable pt to take simple notes when reading scripture and to write checks legibly. Baseline: Eval: Pt reports he has not been able to take notes in awhile d/t poor legibility and this task requiring increased effort and fatigue. Goal status: INITIAL  4.  Pt will increase bilat grip strength by 5 or more lbs to improve ability to open containers.  Baseline: Eval: R grip: 30 lbs, L grip: 45 lbs Goal status: INITIAL  5.  Pt will improve [facility] skills to increase efficiency with manipulation of eating and grooming utensils.   Baseline: Eval: R 33 sec, L 41 sec Goal status: INITIAL  ASSESSMENT:  CLINICAL IMPRESSION: Pt with good tolerance to bilat hand strengthening and coordination training this date.  Pt was able to tolerate 1# wrist weights to each wrist to increase challenge when reaching to place washers and clothespins on dowels.  R hand remains more predominantly affected as compared to L hand strength and coordination skills, though both R/L are diminished.  Pt. continues to benefit from skilled OT to review/progress HEP, review/advise on activity modifications/AE, provide exercises and activities to promote hand strength and coordination skills, and reinforce fall prevention strategies to maximize safety, efficiency, and independence with daily tasks. Pt./spouse continue to be in agreement with poc.   PERFORMANCE DEFICITS: in functional skills including ADLs, IADLs, coordination, dexterity, strength, pain, Fine motor control, Gross motor control, mobility, balance, body mechanics, endurance, decreased knowledge of use of DME, and UE functional use, cognitive skills including memory and safety awareness, and psychosocial skills including coping strategies, environmental adaptation, habits, and routines and behaviors.    IMPAIRMENTS: are limiting patient from ADLs, IADLs, leisure, and social participation.   CO-MORBIDITIES: has co-morbidities such as chronic pain syndrome, spinal stenosis, DDD, dizziness   that affects occupational performance. Patient will benefit from skilled OT to address above impairments and improve overall function.  MODIFICATION OR ASSISTANCE TO COMPLETE EVALUATION: No modification of tasks or assist necessary to complete an evaluation.  OT OCCUPATIONAL PROFILE AND HISTORY: Problem focused assessment: Including review of records relating to presenting problem.  CLINICAL DECISION MAKING: Moderate - several treatment options, min-mod task modification necessary  REHAB POTENTIAL: Good  EVALUATION COMPLEXITY: Low    PLAN:  OT FREQUENCY: 2x/week  OT DURATION: 12 weeks  PLANNED INTERVENTIONS: 97535 self care/ADL training, 16109 therapeutic exercise, 97530 therapeutic activity, 97112 neuromuscular re-education, 97010 moist heat, 97010 cryotherapy, passive range of motion, balance training, functional mobility training, visual/perceptual remediation/compensation, psychosocial skills training, energy conservation, coping strategies training, patient/family education, and DME and/or AE instructions  RECOMMENDED OTHER SERVICES: None at this time  CONSULTED AND AGREED WITH PLAN OF CARE: family member/caregiver  PLAN FOR NEXT SESSION: FOTO; see above   Danelle Earthly, MS, OTR/L  [employer] Dials, OT 12/27/2022, 12:37 PM

## 2022-12-24 NOTE — Therapy (Signed)
OUTPATIENT PHYSICAL THERAPY NEURO TREATMENT      Patient Name: Joseph Arroyo MRN: [id number] DOB:Feb 22, 1941, 82 y.o., male Today's Date: 12/24/2022  END OF SESSION:  PT End of Session - 12/24/22 1317     Visit Number 15    Number of Visits 33    Date for PT Re-Evaluation 02/23/23    Authorization Type [employer]    Progress Note Due on Visit 10    PT Start Time 1317    PT Stop Time 1400    PT Time Calculation (min) 43 min    Equipment Utilized During Treatment Gait belt    Activity Tolerance Patient tolerated treatment well;Patient limited by pain    Behavior During Therapy WFL for tasks assessed/performed                     Past Medical History:  Diagnosis Date   Allergic rhinitis due to pollen 11/21/2007   Brachial neuritis or radiculitis NOS    Cervicalgia    Concussion    age 30 - s/p accident   Costal chondritis    Depression    Essential and other specified forms of tremor    GERD (gastroesophageal reflux disease)    in past   Lumbago    Occlusion and stenosis of carotid artery without mention of cerebral infarction    Seizures (HCC)    age 4 - after concussion   Past Surgical History:  Procedure Laterality Date   CATARACT EXTRACTION Right 07/18/14   CATARACT EXTRACTION W/PHACO Left 08/01/2014   Procedure: CATARACT EXTRACTION PHACO AND INTRAOCULAR LENS PLACEMENT (IOC);  Surgeon: Lockie Mola, MD;  Location: [facility];  Service: Ophthalmology;  Laterality: Left;  IVA TOPICAL   COLONOSCOPY  2008   OTHER SURGICAL HISTORY  2002   ear surgery   STAPEDES SURGERY Right 2002   Patient Active Problem List   Diagnosis Date Noted   Dizziness 10/02/2022   Lumbar facet joint pain 06/25/2022   Osteoarthritis of facet joint of lumbar spine 06/25/2022   Lumbosacral facet joint hypertrophy 06/25/2022   Parkinson's disease with dyskinesia (HCC) 01/27/2022   Dysphagia, pharyngeal phase 09/17/2021   Cervicalgia 08/05/2021    Spondylosis without myelopathy or radiculopathy, lumbosacral region 06/24/2021   Lumbar central spinal stenosis, w/o neurogenic claudication (L4-5) 06/03/2021   Lumbar lateral recess stenosis (Bilateral: L2-3, L4-5) (Right: L3-4) 06/03/2021   Lumbosacral foraminal stenosis (Bilateral: L2-3, L3-4) (Left: L5-S1) 06/03/2021   Lumbar nerve root impingement (Right: L3 at L2-3 & L3-4) 06/03/2021   Ligamentum flavum hypertrophy (L3-4, L4-5) 06/03/2021   Abnormal MRI, lumbar spine (04/23/2021) 04/24/2021   Long term prescription benzodiazepine use (alprazolam) (Xanax) 03/24/2021   Grade 1 Retrolisthesis of L2/L3 (5 mm) and L3/L4 (3 mm) 03/24/2021   Levoscoliosis of lumbar spine (L3-4 apex) 03/24/2021   DDD (degenerative disc disease), lumbosacral 03/24/2021   Lumbosacral facet arthropathy (Left: L3-4, L4-5, and L5-S1) 03/24/2021   Tricompartment osteoarthritis of knee (Left) 03/24/2021   Baker cyst (Left) 03/24/2021   Chronic low back pain (1ry area of Pain) (Bilateral) (R>L) w/o sciatica 03/24/2021   Lumbar facet syndrome (Bilateral) 03/24/2021   Chronic lower extremity pain (2ry area of Pain) (Bilateral) (L>R) 03/24/2021   Lumbosacral radiculitis/sensory radiculopathy at L2 (Bilateral) 03/24/2021   Lumbosacral radiculitis/sensory radiculopathy at L3 (Bilateral) 03/24/2021   Chronic pain syndrome 03/23/2021   Pharmacologic therapy 03/23/2021   Disorder of skeletal system 03/23/2021   Problems influencing health status 03/23/2021   PAD (peripheral artery disease) (  HCC) 10/01/2020   GAD (generalized anxiety disorder) 01/13/2018   MDD (major depressive disorder) 01/13/2018   Umbilical hernia 09/04/2017   Chronic knee pain (Left) 08/11/2017   Derangement of medial meniscus, posterior horn (Left) 08/11/2017   Vitamin D insufficiency 03/05/2016   BPH without obstruction/lower urinary tract symptoms 07/08/2015   Chronic fatigue 10/23/2014   Major depression, recurrent, full remission (HCC)  07/26/2013   Unsteady gait 07/26/2013   Orthostatic hypotension 07/26/2013   Depression 07/26/2013   Anxiety 06/23/2013   Chronic anxiety 06/23/2013   Tremor 05/18/2013   Bilateral carotid artery stenosis 08/30/2012    PCP: Smitty Cords, DO  REFERRING PROVIDER:   Mecum, Oswaldo Conroy, PA-C    REFERRING DIAG: R42 (ICD-10-CM) - Dizziness   THERAPY DIAG:  Muscle weakness (generalized)  Other lack of coordination  Atypical parkinsonism (HCC)  Unsteadiness on feet  Difficulty in walking, not elsewhere classified  Dizziness and giddiness  Other low back pain  Other abnormalities of gait and mobility  Abnormality of gait and mobility  ONSET DATE: August 1st 2024  Rationale for Evaluation and Treatment: Rehabilitation  SUBJECTIVE:   SUBJECTIVE STATEMENT  Pt reports back pain is still present, however his dizziness has improved since the last visit.    Pt accompanied by: significant other   PERTINENT HISTORY:  The pt is a pleasant 82 yo male referred to PT for dizziness. Pt known to clinic, previously seen for falls in the setting of PD.  Pt spouse present for eval and reports pt was seen by neurologist recently at [facility] following onset of dizziness about two-three weeks ago. He reports they think he has BPPV.  PT spouse reports he had blood work done at this visit which "didn't show anything" and was normal. Pt unsure if his symptoms come on when he is still at rest. He describes his symptoms as spinning. This can be triggered when he lies on his back and also when he sits up from supine. Spinning lasts for minutes. He reports no nausea.  Pt sometimes sees double when he looks to the side and this has been going on for weeks. Denies any one sided weakness or other changes. He has fallen 4 times in past 3 months. Pt spouse reports pt had a fall about a week prior to dizziness onset. Pt reports no neck pain, has hx of LBP. His spouse reports decline in his mobility since  this started. Pt uses RW at baseline for ambulation. Pt now being seen primarily for PD impairments. Pt PMH significant for Parkinson's disease, concussion, cervicalgia, depression, seizures, chronic L knee pain, chronic anxiety, DDD, PAD, chronic pain syndrome, chronic low back pain, please refer to chart for full, extensive, PMH. PAIN:  Are you having pain? No Back pain hurting prior, took 3 ibuprophen.   PRECAUTIONS: Fall   WEIGHT BEARING RESTRICTIONS: No  FALLS: Has patient fallen in last 6 months? Yes. Number of falls 4  LIVING ENVIRONMENT: via chart and confirmed by pt in session Lives with: lives with their spouse Lives in: House/apartment Stairs: 4 Has following equipment at home: Walker - 2 wheeled  PLOF:  needs assistance, pt with PD that affected his mobility/ADLs  PATIENT GOALS: get rid of dizziness  OBJECTIVE:     TREATMENT:  DATE: 12/24/22  TherEx:  Seated: 2# AW donned: Seated marches, 2x10 Seated LAQ, 2x10 Seated PF, 2x20 each LE Seated DF. 2x20 each LE Seated hip abduction into GTB, 2x10 each LE Seated lateral step over hedgehogs, 2x10 each LE  Ambulation around the gym with RW, x2 laps  Standing lumbar extension at wall with support on forearms, 2x10  Pt rates pain level 4-5   Pt given pillow on the R side and in the chair along with MHP applied to the lumbar spine to improve pain.    PATIENT EDUCATION: Education details: exercise technique  HOME EXERCISE PROGRAM: Updated: Access Code: [email or web address] URL: [email or web address] Date: 12/08/2022 Prepared by: Temple Pacini  Exercises - Seated March  - 1 x daily - 5-6 x weekly - 4 sets - 10 reps - Seated Long Arc Quad  - 1 x daily - 5-6 x weekly - 2 sets - 15 reps - Seated Heel Toe Raises  - 1 x daily - 5-6 x weekly - 2 sets - 20 reps   Access Code: [email or web address] URL:  [email or web address] Date: 12/24/2022 Prepared by: Tomasa Hose  Exercises - Standing Lumbar Extension at Wall - Forearms  - 1 x daily - 7 x weekly - 3 sets - 10 reps - Prone Press Up On Elbows  - 1 x daily - 7 x weekly - 3 sets - 10 reps - Seated Lumbar Extension  - 1 x daily - 7 x weekly - 3 sets - 10 reps  GOALS:   Goals reviewed with patient? Yes   SHORT TERM GOALS: Target date: 02/04/2023   Patient will be independent in home exercise program to improve dizziness, balance and mobility for better safety and functional independence with ADLs. Baseline: to be initiated future visit/as needed Goal status: INITIAL   LONG TERM GOALS: Target date: 03/18/2023    Patient will increase FOTO score to equal to or greater than 53    to demonstrate statistically significant improvement in mobility and quality of life.  Baseline: 44 Goal status: INITIAL  2.  Patient will reduce dizziness handicap inventory score to <50, for less dizziness with ADLs and increased safety with home and work tasks.  Baseline: deferred, will complete as pt able: 10/10: 48, still has difficulty with bed mobility  Goal status: MET  3.  The pt will report at least a 60% decrease in positional or movement triggered dizziness symptoms in order to indicate decreased fall risk and increased balance/safety with mobility. Baseline: pt presents with decreased mobility due to dizziness, dizziness/vertigo triggered by particular positional; changes; 10/10: 60-70%  Goal status: MET  4.  Patient will reduce timed up and go to <11 seconds to reduce fall risk and demonstrate improved transfer/gait ability. Baseline: 9/5: 18 sec; 10/10: 16 seconds seconds  Goal status: INITIAL  5.Patient (> 24 years old) will complete five times sit to stand test in < 15 seconds indicating an increased LE strength and improved balance. Baseline: 12/01/22: 26 sec Goal status: NEW  6. Patient will increase 10 meter walk test to  >1.29m/s as to improve gait speed for better community ambulation and to reduce fall risk. Baseline: 12/01/22: 0.6 m/s with RW Goal status: NEW   ASSESSMENT:  CLINICAL IMPRESSION:  Pt performed well with the tasks given and was introduced to some lumbar extension exercises in order to assist in alleviating the low back pain he is experiencing.  Pt notes the lumbar extension to be slightly difficult to do, likely due to the lack  of mobility in the spine, however pt is able to progressively increase the amount of the extension in the lumbar spine as he progressed with the exercise.  Pt encouraged and given 3 new lumbar extension exercises to help improve the pain at home.   Pt will continue to benefit from skilled therapy to address remaining deficits in order to improve overall QoL and return to PLOF.      OBJECTIVE IMPAIRMENTS: Abnormal gait, decreased activity tolerance, decreased balance, decreased coordination, decreased mobility, difficulty walking, decreased strength, dizziness, impaired vision/preception, improper body mechanics, and postural dysfunction.   ACTIVITY LIMITATIONS: lifting, bending, squatting, stairs, transfers, bed mobility, toileting, dressing, hygiene/grooming, and locomotion level  PARTICIPATION LIMITATIONS: meal prep, cleaning, laundry, shopping, community activity, and yard work  PERSONAL FACTORS: Age, Fitness, and 3+ comorbidities: Pt PMH significant for Parkinson's disease, concussion, cervicalgia, depression, seizures, chronic L knee pain, chronic anxiety, DDD, PAD, chronic pain syndrome, chronic low back pain, please refer to chart for full, extensive, PMH.  are also affecting patient's functional outcome.   REHAB POTENTIAL: Good  CLINICAL DECISION MAKING: Evolving/moderate complexity  EVALUATION COMPLEXITY: Moderate   PLAN:  PT FREQUENCY: 1-2x/week  PT DURATION: 12 weeks  PLANNED INTERVENTIONS: Therapeutic exercises, Therapeutic activity, Neuromuscular  re-education, Balance training, Gait training, Patient/Family education, Self Care, Joint mobilization, Stair training, Vestibular training, Canalith repositioning, Visual/preceptual remediation/compensation, DME instructions, Electrical stimulation, Wheelchair mobility training, Spinal mobilization, Cryotherapy, Moist heat, Splintting, Taping, Traction, Manual therapy, and Re-evaluation  PLAN FOR NEXT SESSION: attempt standing/supported PD interventions,  bed mobility (supine>seated) as able    Nolon Bussing, PT, DPT Physical Therapist - [facility]  12/24/22, 6:12 PM

## 2022-12-29 ENCOUNTER — Ambulatory Visit: Payer: Medicare HMO

## 2022-12-29 DIAGNOSIS — R278 Other lack of coordination: Secondary | ICD-10-CM | POA: Diagnosis not present

## 2022-12-29 DIAGNOSIS — M6281 Muscle weakness (generalized): Secondary | ICD-10-CM

## 2022-12-29 DIAGNOSIS — R262 Difficulty in walking, not elsewhere classified: Secondary | ICD-10-CM | POA: Diagnosis not present

## 2022-12-29 DIAGNOSIS — R2681 Unsteadiness on feet: Secondary | ICD-10-CM | POA: Diagnosis not present

## 2022-12-29 DIAGNOSIS — G20C Parkinsonism, unspecified: Secondary | ICD-10-CM | POA: Diagnosis not present

## 2022-12-29 DIAGNOSIS — R42 Dizziness and giddiness: Secondary | ICD-10-CM | POA: Diagnosis not present

## 2022-12-29 DIAGNOSIS — M5459 Other low back pain: Secondary | ICD-10-CM

## 2022-12-29 DIAGNOSIS — R269 Unspecified abnormalities of gait and mobility: Secondary | ICD-10-CM | POA: Diagnosis not present

## 2022-12-29 DIAGNOSIS — R2689 Other abnormalities of gait and mobility: Secondary | ICD-10-CM | POA: Diagnosis not present

## 2022-12-29 NOTE — Therapy (Signed)
OUTPATIENT PHYSICAL THERAPY NEURO TREATMENT      Patient Name: Joseph Arroyo MRN: [id number] DOB:01-01-1941, 82 y.o., male Today's Date: 12/29/2022  END OF SESSION:  PT End of Session - 12/29/22 1316     Visit Number 16    Number of Visits 33    Date for PT Re-Evaluation 02/23/23    Authorization Type [employer] Medicare    Progress Note Due on Visit 10    PT Start Time 1318    PT Stop Time 1359    PT Time Calculation (min) 41 min    Equipment Utilized During Treatment Gait belt    Activity Tolerance Patient tolerated treatment well;Patient limited by pain    Behavior During Therapy WFL for tasks assessed/performed                     Past Medical History:  Diagnosis Date   Allergic rhinitis due to pollen 11/21/2007   Brachial neuritis or radiculitis NOS    Cervicalgia    Concussion    age 58 - s/p accident   Costal chondritis    Depression    Essential and other specified forms of tremor    GERD (gastroesophageal reflux disease)    in past   Lumbago    Occlusion and stenosis of carotid artery without mention of cerebral infarction    Seizures (HCC)    age 52 - after concussion   Past Surgical History:  Procedure Laterality Date   CATARACT EXTRACTION Right 07/18/14   CATARACT EXTRACTION W/PHACO Left 08/01/2014   Procedure: CATARACT EXTRACTION PHACO AND INTRAOCULAR LENS PLACEMENT (IOC);  Surgeon: Lockie Mola, MD;  Location: [facility];  Service: Ophthalmology;  Laterality: Left;  IVA TOPICAL   COLONOSCOPY  2008   OTHER SURGICAL HISTORY  2002   ear surgery   STAPEDES SURGERY Right 2002   Patient Active Problem List   Diagnosis Date Noted   Dizziness 10/02/2022   Lumbar facet joint pain 06/25/2022   Osteoarthritis of facet joint of lumbar spine 06/25/2022   Lumbosacral facet joint hypertrophy 06/25/2022   Parkinson's disease with dyskinesia (HCC) 01/27/2022   Dysphagia, pharyngeal phase 09/17/2021   Cervicalgia 08/05/2021    Spondylosis without myelopathy or radiculopathy, lumbosacral region 06/24/2021   Lumbar central spinal stenosis, w/o neurogenic claudication (L4-5) 06/03/2021   Lumbar lateral recess stenosis (Bilateral: L2-3, L4-5) (Right: L3-4) 06/03/2021   Lumbosacral foraminal stenosis (Bilateral: L2-3, L3-4) (Left: L5-S1) 06/03/2021   Lumbar nerve root impingement (Right: L3 at L2-3 & L3-4) 06/03/2021   Ligamentum flavum hypertrophy (L3-4, L4-5) 06/03/2021   Abnormal MRI, lumbar spine (04/23/2021) 04/24/2021   Long term prescription benzodiazepine use (alprazolam) (Xanax) 03/24/2021   Grade 1 Retrolisthesis of L2/L3 (5 mm) and L3/L4 (3 mm) 03/24/2021   Levoscoliosis of lumbar spine (L3-4 apex) 03/24/2021   DDD (degenerative disc disease), lumbosacral 03/24/2021   Lumbosacral facet arthropathy (Left: L3-4, L4-5, and L5-S1) 03/24/2021   Tricompartment osteoarthritis of knee (Left) 03/24/2021   Baker cyst (Left) 03/24/2021   Chronic low back pain (1ry area of Pain) (Bilateral) (R>L) w/o sciatica 03/24/2021   Lumbar facet syndrome (Bilateral) 03/24/2021   Chronic lower extremity pain (2ry area of Pain) (Bilateral) (L>R) 03/24/2021   Lumbosacral radiculitis/sensory radiculopathy at L2 (Bilateral) 03/24/2021   Lumbosacral radiculitis/sensory radiculopathy at L3 (Bilateral) 03/24/2021   Chronic pain syndrome 03/23/2021   Pharmacologic therapy 03/23/2021   Disorder of skeletal system 03/23/2021   Problems influencing health status 03/23/2021   PAD (peripheral artery disease) (  HCC) 10/01/2020   GAD (generalized anxiety disorder) 01/13/2018   MDD (major depressive disorder) 01/13/2018   Umbilical hernia 09/04/2017   Chronic knee pain (Left) 08/11/2017   Derangement of medial meniscus, posterior horn (Left) 08/11/2017   Vitamin D insufficiency 03/05/2016   BPH without obstruction/lower urinary tract symptoms 07/08/2015   Chronic fatigue 10/23/2014   Major depression, recurrent, full remission (HCC)  07/26/2013   Unsteady gait 07/26/2013   Orthostatic hypotension 07/26/2013   Depression 07/26/2013   Anxiety 06/23/2013   Chronic anxiety 06/23/2013   Tremor 05/18/2013   Bilateral carotid artery stenosis 08/30/2012    PCP: Smitty Cords, DO  REFERRING PROVIDER:   Mecum, Oswaldo Conroy, PA-C    REFERRING DIAG: R42 (ICD-10-CM) - Dizziness   THERAPY DIAG:  Muscle weakness (generalized)  Difficulty in walking, not elsewhere classified  Unsteadiness on feet  Other lack of coordination  Other low back pain  ONSET DATE: August 1st 2024  Rationale for Evaluation and Treatment: Rehabilitation  SUBJECTIVE:   SUBJECTIVE STATEMENT  Pt reports 4-5/10 LBP.  Pt reports still has some dizziness with quick turns.   Pt accompanied by: significant other   PERTINENT HISTORY:  The pt is a pleasant 82 yo male referred to PT for dizziness. Pt known to clinic, previously seen for falls in the setting of PD.  Pt spouse present for eval and reports pt was seen by neurologist recently at [facility] following onset of dizziness about two-three weeks ago. He reports they think he has BPPV.  PT spouse reports he had blood work done at this visit which "didn't show anything" and was normal. Pt unsure if his symptoms come on when he is still at rest. He describes his symptoms as spinning. This can be triggered when he lies on his back and also when he sits up from supine. Spinning lasts for minutes. He reports no nausea.  Pt sometimes sees double when he looks to the side and this has been going on for weeks. Denies any one sided weakness or other changes. He has fallen 4 times in past 3 months. Pt spouse reports pt had a fall about a week prior to dizziness onset. Pt reports no neck pain, has hx of LBP. His spouse reports decline in his mobility since this started. Pt uses RW at baseline for ambulation. Pt now being seen primarily for PD impairments. Pt PMH significant for Parkinson's disease, concussion,  cervicalgia, depression, seizures, chronic L knee pain, chronic anxiety, DDD, PAD, chronic pain syndrome, chronic low back pain, please refer to chart for full, extensive, PMH. PAIN:  Are you having pain? No Back pain hurting prior, took 3 ibuprophen.   PRECAUTIONS: Fall   WEIGHT BEARING RESTRICTIONS: No  FALLS: Has patient fallen in last 6 months? Yes. Number of falls 4  LIVING ENVIRONMENT: via chart and confirmed by pt in session Lives with: lives with their spouse Lives in: House/apartment Stairs: 4 Has following equipment at home: Walker - 2 wheeled  PLOF:  needs assistance, pt with PD that affected his mobility/ADLs  PATIENT GOALS: get rid of dizziness  OBJECTIVE:     TREATMENT:  DATE: 12/29/22  TherEx:  Seated: 2# AW donned: Seated marches, 2x15 Seated LAQ, 2x12 - rates difficult  Standing march 2x10 each LE   Ambulation around the gym with RW, 2x148 ft with 2# weights cuing to stand up straight. Rates medium-hard   Nustep lvl 1 x 3 min set up assist close cga for mount/dismount, discontinued at 3 min due to reports of low back discomfort  NMR: Large amplitude movement training Seated fwd reach>reach down>reach up>reach to the side 10x reports feels good.  Seated reach and twist 10x each side STS with bilat shoulder abduction 10x Seated bilat shoulder abd with twist 10x each way     Pt given pillow on the R side and in the chair along with MHP applied to the lumbar spine to improve pain.   PATIENT EDUCATION: Education details: exercise technique  HOME EXERCISE PROGRAM: Updated: Access Code: [email or web address] URL: [email or web address] Date: 12/08/2022 Prepared by: Temple Pacini  Exercises - Seated March  - 1 x daily - 5-6 x weekly - 4 sets - 10 reps - Seated Long Arc Quad  - 1 x daily - 5-6 x weekly - 2 sets - 15 reps - Seated Heel Toe Raises  - 1 x  daily - 5-6 x weekly - 2 sets - 20 reps   Access Code: [email or web address] URL: [email or web address] Date: 12/24/2022 Prepared by: Tomasa Hose  Exercises - Standing Lumbar Extension at Wall - Forearms  - 1 x daily - 7 x weekly - 3 sets - 10 reps - Prone Press Up On Elbows  - 1 x daily - 7 x weekly - 3 sets - 10 reps - Seated Lumbar Extension  - 1 x daily - 7 x weekly - 3 sets - 10 reps  GOALS:   Goals reviewed with patient? Yes   SHORT TERM GOALS: Target date: 02/09/2023   Patient will be independent in home exercise program to improve dizziness, balance and mobility for better safety and functional independence with ADLs. Baseline: to be initiated future visit/as needed Goal status: INITIAL   LONG TERM GOALS: Target date: 03/23/2023    Patient will increase FOTO score to equal to or greater than 53    to demonstrate statistically significant improvement in mobility and quality of life.  Baseline: 44 Goal status: INITIAL  2.  Patient will reduce dizziness handicap inventory score to <50, for less dizziness with ADLs and increased safety with home and work tasks.  Baseline: deferred, will complete as pt able: 10/10: 48, still has difficulty with bed mobility  Goal status: MET  3.  The pt will report at least a 60% decrease in positional or movement triggered dizziness symptoms in order to indicate decreased fall risk and increased balance/safety with mobility. Baseline: pt presents with decreased mobility due to dizziness, dizziness/vertigo triggered by particular positional; changes; 10/10: 60-70%  Goal status: MET  4.  Patient will reduce timed up and go to <11 seconds to reduce fall risk and demonstrate improved transfer/gait ability. Baseline: 9/5: 18 sec; 10/10: 16 seconds seconds  Goal status: INITIAL  5.Patient (> 32 years old) will complete five times sit to stand test in < 15 seconds indicating an increased LE strength and improved balance. Baseline: 12/01/22:  26 sec Goal status: NEW  6. Patient will increase 10 meter walk test to >1.40m/s as to improve gait speed for better community ambulation and to reduce fall risk. Baseline: 12/01/22: 0.6 m/s with RW Goal status: NEW   ASSESSMENT:  CLINICAL IMPRESSION: Progressed  pt with increased gait training and nustep to promote LE strength and LE mm and cardio endurance.Pt tolerated these interventions fair: he is still limited by fatigue and by low back pain. Discontinued nustep early due to LBP, but pt likely will tolerate this intervention better with use of LE only and not UE and LE. Pt will continue to benefit from skilled therapy to address remaining deficits in order to improve overall QoL and return to PLOF.      OBJECTIVE IMPAIRMENTS: Abnormal gait, decreased activity tolerance, decreased balance, decreased coordination, decreased mobility, difficulty walking, decreased strength, dizziness, impaired vision/preception, improper body mechanics, and postural dysfunction.   ACTIVITY LIMITATIONS: lifting, bending, squatting, stairs, transfers, bed mobility, toileting, dressing, hygiene/grooming, and locomotion level  PARTICIPATION LIMITATIONS: meal prep, cleaning, laundry, shopping, community activity, and yard work  PERSONAL FACTORS: Age, Fitness, and 3+ comorbidities: Pt PMH significant for Parkinson's disease, concussion, cervicalgia, depression, seizures, chronic L knee pain, chronic anxiety, DDD, PAD, chronic pain syndrome, chronic low back pain, please refer to chart for full, extensive, PMH.  are also affecting patient's functional outcome.   REHAB POTENTIAL: Good  CLINICAL DECISION MAKING: Evolving/moderate complexity  EVALUATION COMPLEXITY: Moderate   PLAN:  PT FREQUENCY: 1-2x/week  PT DURATION: 12 weeks  PLANNED INTERVENTIONS: Therapeutic exercises, Therapeutic activity, Neuromuscular re-education, Balance training, Gait training, Patient/Family education, Self Care, Joint  mobilization, Stair training, Vestibular training, Canalith repositioning, Visual/preceptual remediation/compensation, DME instructions, Electrical stimulation, Wheelchair mobility training, Spinal mobilization, Cryotherapy, Moist heat, Splintting, Taping, Traction, Manual therapy, and Re-evaluation  PLAN FOR NEXT SESSION: attempt standing/supported PD interventions,  bed mobility (supine>seated) as able    Temple Pacini PT, DPT  Physical Therapist - [facility]  12/29/22, 4:33 PM

## 2022-12-30 NOTE — Therapy (Signed)
OUTPATIENT OCCUPATIONAL THERAPY NEURO TREATMENT NOTE  Patient Name: Joseph Arroyo MRN: [id number] DOB:11/02/1940, 82 y.o., male Today's Date: 12/30/2022  PCP: Dr. Saralyn Pilar REFERRING PROVIDER: PCP  END OF SESSION:  OT End of Session - 12/30/22 1648     Visit Number 6    Number of Visits 24    Date for OT Re-Evaluation 03/04/23    Progress Note Due on Visit 10    OT Start Time 1400    OT Stop Time 1445    OT Time Calculation (min) 45 min    Equipment Utilized During Treatment RW    Activity Tolerance Patient tolerated treatment well    Behavior During Therapy [facility] for tasks assessed/performed            Past Medical History:  Diagnosis Date   Allergic rhinitis due to pollen 11/21/2007   Brachial neuritis or radiculitis NOS    Cervicalgia    Concussion    age 89 - s/p accident   Costal chondritis    Depression    Essential and other specified forms of tremor    GERD (gastroesophageal reflux disease)    in past   Lumbago    Occlusion and stenosis of carotid artery without mention of cerebral infarction    Seizures [facility])    age 66 - after concussion   Past Surgical History:  Procedure Laterality Date   CATARACT EXTRACTION Right 07/18/14   CATARACT EXTRACTION W/PHACO Left 08/01/2014   Procedure: CATARACT EXTRACTION PHACO AND INTRAOCULAR LENS PLACEMENT (IOC);  Surgeon: Lockie Mola, MD;  Location: [facility];  Service: Ophthalmology;  Laterality: Left;  IVA TOPICAL   COLONOSCOPY  2008   OTHER SURGICAL HISTORY  2002   ear surgery   STAPEDES SURGERY Right 2002   Patient Active Problem List   Diagnosis Date Noted   Dizziness 10/02/2022   Lumbar facet joint pain 06/25/2022   Osteoarthritis of facet joint of lumbar spine 06/25/2022   Lumbosacral facet joint hypertrophy 06/25/2022   Parkinson's disease with dyskinesia (HCC) 01/27/2022   Dysphagia, pharyngeal phase 09/17/2021   Cervicalgia 08/05/2021   Spondylosis without myelopathy  or radiculopathy, lumbosacral region 06/24/2021   Lumbar central spinal stenosis, w/o neurogenic claudication (L4-5) 06/03/2021   Lumbar lateral recess stenosis (Bilateral: L2-3, L4-5) (Right: L3-4) 06/03/2021   Lumbosacral foraminal stenosis (Bilateral: L2-3, L3-4) (Left: L5-S1) 06/03/2021   Lumbar nerve root impingement (Right: L3 at L2-3 & L3-4) 06/03/2021   Ligamentum flavum hypertrophy (L3-4, L4-5) 06/03/2021   Abnormal MRI, lumbar spine (04/23/2021) 04/24/2021   Long term prescription benzodiazepine use (alprazolam) (Xanax) 03/24/2021   Grade 1 Retrolisthesis of L2/L3 (5 mm) and L3/L4 (3 mm) 03/24/2021   Levoscoliosis of lumbar spine (L3-4 apex) 03/24/2021   DDD (degenerative disc disease), lumbosacral 03/24/2021   Lumbosacral facet arthropathy (Left: L3-4, L4-5, and L5-S1) 03/24/2021   Tricompartment osteoarthritis of knee (Left) 03/24/2021   Baker cyst (Left) 03/24/2021   Chronic low back pain (1ry area of Pain) (Bilateral) (R>L) w/o sciatica 03/24/2021   Lumbar facet syndrome (Bilateral) 03/24/2021   Chronic lower extremity pain (2ry area of Pain) (Bilateral) (L>R) 03/24/2021   Lumbosacral radiculitis/sensory radiculopathy at L2 (Bilateral) 03/24/2021   Lumbosacral radiculitis/sensory radiculopathy at L3 (Bilateral) 03/24/2021   Chronic pain syndrome 03/23/2021   Pharmacologic therapy 03/23/2021   Disorder of skeletal system 03/23/2021   Problems influencing health status 03/23/2021   PAD (peripheral artery disease) (HCC) 10/01/2020   GAD (generalized anxiety disorder) 01/13/2018   MDD (major depressive disorder)  01/13/2018   Umbilical hernia 09/04/2017   Chronic knee pain (Left) 08/11/2017   Derangement of medial meniscus, posterior horn (Left) 08/11/2017   Vitamin D insufficiency 03/05/2016   BPH without obstruction/lower urinary tract symptoms 07/08/2015   Chronic fatigue 10/23/2014   Major depression, recurrent, full remission (HCC) 07/26/2013   Unsteady gait 07/26/2013    Orthostatic hypotension 07/26/2013   Depression 07/26/2013   Anxiety 06/23/2013   Chronic anxiety 06/23/2013   Tremor 05/18/2013   Bilateral carotid artery stenosis 08/30/2012   ONSET DATE: 2015  REFERRING DIAG: Parkinson's Disease  THERAPY DIAG:  Muscle weakness (generalized)  Other lack of coordination  Atypical parkinsonism (HCC)  Rationale for Evaluation and Treatment: Rehabilitation  SUBJECTIVE:  SUBJECTIVE STATEMENT: Pt verbalizes a lot of back pain today.  Moist heat used throughout session to increase comfort. Pt accompanied by: significant other, spouse, [name]  PERTINENT HISTORY: Hx of Parkinson's disease (HCC); Chronic knee pain (Left); Derangement of medial meniscus, posterior horn (Left); PAD (peripheral artery disease) (HCC); Chronic pain syndrome; DDD (degenerative disc disease) lumbosacral; osteoarthritis of knee (Left); Chronic low back pain, Lumbar facet syndrome (Bilateral); Chronic lower extremity pain.   PRECAUTIONS: Fall  WEIGHT BEARING RESTRICTIONS: No  PAIN:  Are you having pain? Yes: NPRS scale: 8-10/10 Pain location: low back  Pain description: aching, sometimes sharp  Aggravating factors: activity  Relieving factors: lying down, heat, ice pack  FALLS: Has patient fallen in last 6 months? Yes. Number of falls 4  LIVING ENVIRONMENT: Lives with: lives with their spouse Lives in: 1 level home Stairs: 4 steps, a landing, then 3 more steps with bilat rails.  Has following equipment at home: Dan Humphreys - 2 wheeled, Shower bench, bed side commode, and Grab bars   PLOF: Needs assistance with ADLs.  Pt performs basic self care with set up assist, extra time to manage clothing fasteners, occasional min A on days with worsening Parkinson's symptoms or back pain.    PATIENT GOALS: Pt wants to regain some strength and coordination skills in bilat hands.   OBJECTIVE:  Note: Objective measures were completed at Evaluation unless otherwise noted.  HAND  DOMINANCE: Right  ADLs: Overall ADLs: Extensive time required; occasional min A from spouse Transfers/ambulation related to ADLs: supv-modified indep with RW Eating: difficulty cutting food, struggles with loading food on a fork (uses built up foam handles at times); spouse cuts steak Grooming: electric toothbrush and standard toothbrush, but mostly uses electric; increased difficulty with brushing teeth and shaving (has electric trimmer and regular razor) UB Dressing: magnetic buttons on some shirts; more difficult to button shirts, occasional help with donning a shirt (min A)  LB Dressing: difficulty tying shoe laces, increased time; uses long shoe horn, has a hip kit; occasional assist to don socks  Toileting: modified indep/extra time  Bathing: able to take a shower with supv with tub bench Tub Shower transfers: transfer tub bench Equipment:  Pt does have hip kit and he regularly uses the long shoe horn.  Spouse reports pt may need some review with the other tools.   IADLs: Shopping: Spouse manages Light housekeeping: Spouse manages Meal Prep: Spouse manages Community mobility: Uses RW and requires frequent sitting breaks d/t back pain Medication management: modified Engineer, materials: Extra time for writing checks, with occasional assist from spouse on days when tremors are worse Handwriting: 90% legible, though pt reports this is an exceptional day today, and pt reports he's recently had to rip of checks and have spouse take over d/t  poor legibility.  MOBILITY STATUS: Hx of falls; spouse reports pt to have had 4 falls in the last 6 mo  POSTURE COMMENTS:  rounded shoulders, increased thoracic kyphosis, and hx of scoliosis , L shoulder rests significantly lower than R Sitting balance: Supports self with one extremity  ACTIVITY TOLERANCE: Activity tolerance: limited by low back pain   FUNCTIONAL OUTCOME MEASURES: FOTO: TBD  UPPER EXTREMITY ROM:  BUEs WFL  UPPER  EXTREMITY MMT:     MMT Right eval Left eval  Shoulder flexion 5 5  Shoulder abduction 5 5  Shoulder adduction    Shoulder extension    Shoulder internal rotation 4 4  Shoulder external rotation 4 4  Middle trapezius    Lower trapezius    Elbow flexion    Elbow extension    Wrist flexion    Wrist extension    Wrist ulnar deviation    Wrist radial deviation    Wrist pronation    Wrist supination    (Blank rows = not tested)  HAND FUNCTION: Grip strength: Right: 30 lbs; Left: 45 lbs, Lateral pinch: Right: 12 lbs, Left: 5 lbs, and 3 point pinch: Right: 10 lbs, Left: 6 lbs  COORDINATION: 9 Hole Peg test: Right: 33 sec; Left: 41 sec  SENSATION: Not tested  EDEMA: No visible edema  MUSCLE TONE: RUE: Within functional limits and LUE: Within functional limits  COGNITION: Overall cognitive status: Within functional limits for tasks assessed; spouse reports some impulsivity/poor safety awareness contributing to falls at home  VISION: No recent changes; wears glasses for reading Visual history: macular degeneration, hx of cataract removal both eyes  PERCEPTION: Not tested  PRAXIS: Impaired: Motor planning  TODAY'S TREATMENT:                                                                                                                              DATE: 12/29/22 Moist heat applied to low back with support of lumbar pillow; a second pillow wedged between R side and arm rest of chair for midline position and back pain management throughout session.  Therapeutic Activity: Participation in activities to promote strength and dexterity for tearing checks from check book, and separating check from duplicate. -paper folding/tearing layers of folded paper/splitting layers apart  Self Care: Participation in pre-writing exercises and strategies to reduce micrographia when writing checks.  Advised on external aides with high color contrast to position above lines on check (ie strip of  cardboard or black paper) to use as a visual target for letter height.  Advised pt may also benefit from a "check writing guide," typically used for low vision, but template has cutouts for checks spaces, which may allow a tactile and visual aid for increasing letter height.  Pt receptive to try this next session.  Pt practiced writing "x's," "o's," and numbers across paper with goal of touching each letter or number to top and bottom of 2 line spaces, marked with high  contrast red thick marker.  Visual cue with the red marker allowed consistent tall letter height, but crowding of letters occurred with more reps.  Min vc to be mindful of spacing.  Min vc for positioning bilat forearms on table top to reduce tremors.     PATIENT EDUCATION: Education details: Writing strategies and aids Person educated: Patient and Spouse Education method: Explanation, demo Education comprehension: verbalized understanding; further training needed  HOME EXERCISE PROGRAM: Pt instructed to use his theraputty or his hand strengthener (spring based), [facility] exercises (handout issued)  GOALS: Goals reviewed with patient? Yes  SHORT TERM GOALS: Target date: 01/21/23  Pt will perform HEP with supv for increasing strength and coordination in B hands.  Baseline: Eval: Will review previous HEP and add to as appropriate Goal status: INITIAL  2.  Pt will implement fall prevention strategies with min vc from spouse at least 50% of the time.  Baseline: Spouse reports pt with 4 falls over the last 6 months, noting decreased safety awareness/impulsivity Goal status: INITIAL  LONG TERM GOALS: Target date: 03/04/2023  Pt will increase FOTO score by (TBD) to indicate improvement in perceived functional performance with daily tasks. Baseline: Eval: FOTO to be given next session. Goal status: INITIAL  2.  Pt will utilize AE/activity modification to participate in face time calls with family [place] with set up/supv. Baseline:  Eval: Pt reports he is struggling to accurately press buttons on his cell phone, often having spouse assist to manage a call (Initial recommendation at eval to transition to tablet for Face time calls to work with larger screen surface; spouse receptive) Goal status: INITIAL  3.  Pt will use adapted writing aids/utilize positioning strategies/activity modifications to enable pt to take simple notes when reading scripture and to write checks legibly. Baseline: Eval: Pt reports he has not been able to take notes in awhile d/t poor legibility and this task requiring increased effort and fatigue. Goal status: INITIAL  4.  Pt will increase bilat grip strength by 5 or more lbs to improve ability to open containers.  Baseline: Eval: R grip: 30 lbs, L grip: 45 lbs Goal status: INITIAL  5.  Pt will improve [facility] skills to increase efficiency with manipulation of eating and grooming utensils.   Baseline: Eval: R 33 sec, L 41 sec Goal status: INITIAL  ASSESSMENT:  CLINICAL IMPRESSION: Pt verbalized high pain levels in back today, though moist hot pack continues to bring some comfort during treatments.  Pt tends to have more bradykinesia with higher pain levels on these days (such as today), and speech is more difficult to understanding on these days.  Pt expressed frustration with writing checks over the weekend, noting micrographia, and verbalized difficulty tearing checks from book, and separating checks from duplicate.  Pt with good participation in activities noted above to target these functional deficits.  OT did provide education on compensatory methods for reducing micrographia and turning pages, including a rubber tip for finger (page turning), and a check writing guide (for micrographia).  Pt receptive to these and willing to try check writing guide next session to assess whether the borders from the check overlay may provide a tactile cue for letter height.  Pt. continues to benefit from skilled OT  to review/progress HEP, review/advise on activity modifications/AE, provide exercises and activities to promote hand strength and coordination skills, and reinforce fall prevention strategies to maximize safety, efficiency, and independence with daily tasks. Pt./spouse continue to be in agreement with poc.  PERFORMANCE DEFICITS: in functional skills including ADLs, IADLs, coordination, dexterity, strength, pain, Fine motor control, Gross motor control, mobility, balance, body mechanics, endurance, decreased knowledge of use of DME, and UE functional use, cognitive skills including memory and safety awareness, and psychosocial skills including coping strategies, environmental adaptation, habits, and routines and behaviors.   IMPAIRMENTS: are limiting patient from ADLs, IADLs, leisure, and social participation.   CO-MORBIDITIES: has co-morbidities such as chronic pain syndrome, spinal stenosis, DDD, dizziness   that affects occupational performance. Patient will benefit from skilled OT to address above impairments and improve overall function.  MODIFICATION OR ASSISTANCE TO COMPLETE EVALUATION: No modification of tasks or assist necessary to complete an evaluation.  OT OCCUPATIONAL PROFILE AND HISTORY: Problem focused assessment: Including review of records relating to presenting problem.  CLINICAL DECISION MAKING: Moderate - several treatment options, min-mod task modification necessary  REHAB POTENTIAL: Good  EVALUATION COMPLEXITY: Low    PLAN:  OT FREQUENCY: 2x/week  OT DURATION: 12 weeks  PLANNED INTERVENTIONS: 97535 self care/ADL training, 45409 therapeutic exercise, 97530 therapeutic activity, 97112 neuromuscular re-education, 97010 moist heat, 97010 cryotherapy, passive range of motion, balance training, functional mobility training, visual/perceptual remediation/compensation, psychosocial skills training, energy conservation, coping strategies training, patient/family education, and  DME and/or AE instructions  RECOMMENDED OTHER SERVICES: None at this time  CONSULTED AND AGREED WITH PLAN OF CARE: family member/caregiver  PLAN FOR NEXT SESSION: FOTO; see above   Danelle Earthly, MS, OTR/L  Otis Dials, OT 12/30/2022, 4:49 PM

## 2022-12-31 ENCOUNTER — Ambulatory Visit: Payer: Medicare HMO

## 2022-12-31 ENCOUNTER — Ambulatory Visit: Payer: Medicare HMO | Admitting: Occupational Therapy

## 2022-12-31 DIAGNOSIS — R278 Other lack of coordination: Secondary | ICD-10-CM

## 2022-12-31 DIAGNOSIS — M6281 Muscle weakness (generalized): Secondary | ICD-10-CM | POA: Diagnosis not present

## 2022-12-31 DIAGNOSIS — R262 Difficulty in walking, not elsewhere classified: Secondary | ICD-10-CM

## 2022-12-31 DIAGNOSIS — G20C Parkinsonism, unspecified: Secondary | ICD-10-CM | POA: Diagnosis not present

## 2022-12-31 DIAGNOSIS — R269 Unspecified abnormalities of gait and mobility: Secondary | ICD-10-CM | POA: Diagnosis not present

## 2022-12-31 DIAGNOSIS — M5459 Other low back pain: Secondary | ICD-10-CM

## 2022-12-31 DIAGNOSIS — R42 Dizziness and giddiness: Secondary | ICD-10-CM | POA: Diagnosis not present

## 2022-12-31 DIAGNOSIS — R2681 Unsteadiness on feet: Secondary | ICD-10-CM | POA: Diagnosis not present

## 2022-12-31 DIAGNOSIS — R2689 Other abnormalities of gait and mobility: Secondary | ICD-10-CM | POA: Diagnosis not present

## 2022-12-31 NOTE — Therapy (Signed)
OUTPATIENT PHYSICAL THERAPY NEURO TREATMENT      Patient Name: Joseph Arroyo MRN: [id number] DOB:02-24-1941, 82 y.o., male Today's Date: 12/31/2022  END OF SESSION:  PT End of Session - 12/31/22 1533     Visit Number 17    Number of Visits 33    Date for PT Re-Evaluation 02/23/23    Authorization Type [employer]    Progress Note Due on Visit 10    PT Start Time 1532    PT Stop Time 1611    PT Time Calculation (min) 39 min    Equipment Utilized During Treatment Gait belt    Activity Tolerance Patient tolerated treatment well;Patient limited by pain    Behavior During Therapy WFL for tasks assessed/performed                     Past Medical History:  Diagnosis Date   Allergic rhinitis due to pollen 11/21/2007   Brachial neuritis or radiculitis NOS    Cervicalgia    Concussion    age 12 - s/p accident   Costal chondritis    Depression    Essential and other specified forms of tremor    GERD (gastroesophageal reflux disease)    in past   Lumbago    Occlusion and stenosis of carotid artery without mention of cerebral infarction    Seizures (HCC)    age 60 - after concussion   Past Surgical History:  Procedure Laterality Date   CATARACT EXTRACTION Right 07/18/14   CATARACT EXTRACTION W/PHACO Left 08/01/2014   Procedure: CATARACT EXTRACTION PHACO AND INTRAOCULAR LENS PLACEMENT (IOC);  Surgeon: Lockie Mola, MD;  Location: [facility];  Service: Ophthalmology;  Laterality: Left;  IVA TOPICAL   COLONOSCOPY  2008   OTHER SURGICAL HISTORY  2002   ear surgery   STAPEDES SURGERY Right 2002   Patient Active Problem List   Diagnosis Date Noted   Dizziness 10/02/2022   Lumbar facet joint pain 06/25/2022   Osteoarthritis of facet joint of lumbar spine 06/25/2022   Lumbosacral facet joint hypertrophy 06/25/2022   Parkinson's disease with dyskinesia (HCC) 01/27/2022   Dysphagia, pharyngeal phase 09/17/2021   Cervicalgia 08/05/2021    Spondylosis without myelopathy or radiculopathy, lumbosacral region 06/24/2021   Lumbar central spinal stenosis, w/o neurogenic claudication (L4-5) 06/03/2021   Lumbar lateral recess stenosis (Bilateral: L2-3, L4-5) (Right: L3-4) 06/03/2021   Lumbosacral foraminal stenosis (Bilateral: L2-3, L3-4) (Left: L5-S1) 06/03/2021   Lumbar nerve root impingement (Right: L3 at L2-3 & L3-4) 06/03/2021   Ligamentum flavum hypertrophy (L3-4, L4-5) 06/03/2021   Abnormal MRI, lumbar spine (04/23/2021) 04/24/2021   Long term prescription benzodiazepine use (alprazolam) (Xanax) 03/24/2021   Grade 1 Retrolisthesis of L2/L3 (5 mm) and L3/L4 (3 mm) 03/24/2021   Levoscoliosis of lumbar spine (L3-4 apex) 03/24/2021   DDD (degenerative disc disease), lumbosacral 03/24/2021   Lumbosacral facet arthropathy (Left: L3-4, L4-5, and L5-S1) 03/24/2021   Tricompartment osteoarthritis of knee (Left) 03/24/2021   Baker cyst (Left) 03/24/2021   Chronic low back pain (1ry area of Pain) (Bilateral) (R>L) w/o sciatica 03/24/2021   Lumbar facet syndrome (Bilateral) 03/24/2021   Chronic lower extremity pain (2ry area of Pain) (Bilateral) (L>R) 03/24/2021   Lumbosacral radiculitis/sensory radiculopathy at L2 (Bilateral) 03/24/2021   Lumbosacral radiculitis/sensory radiculopathy at L3 (Bilateral) 03/24/2021   Chronic pain syndrome 03/23/2021   Pharmacologic therapy 03/23/2021   Disorder of skeletal system 03/23/2021   Problems influencing health status 03/23/2021   PAD (peripheral artery disease) (  HCC) 10/01/2020   GAD (generalized anxiety disorder) 01/13/2018   MDD (major depressive disorder) 01/13/2018   Umbilical hernia 09/04/2017   Chronic knee pain (Left) 08/11/2017   Derangement of medial meniscus, posterior horn (Left) 08/11/2017   Vitamin D insufficiency 03/05/2016   BPH without obstruction/lower urinary tract symptoms 07/08/2015   Chronic fatigue 10/23/2014   Major depression, recurrent, full remission (HCC)  07/26/2013   Unsteady gait 07/26/2013   Orthostatic hypotension 07/26/2013   Depression 07/26/2013   Anxiety 06/23/2013   Chronic anxiety 06/23/2013   Tremor 05/18/2013   Bilateral carotid artery stenosis 08/30/2012    PCP: Smitty Cords, DO  REFERRING PROVIDER:   Mecum, Oswaldo Conroy, PA-C    REFERRING DIAG: R42 (ICD-10-CM) - Dizziness   THERAPY DIAG:  Muscle weakness (generalized)  Difficulty in walking, not elsewhere classified  Other low back pain  Other lack of coordination  Unsteadiness on feet  ONSET DATE: August 1st 2024  Rationale for Evaluation and Treatment: Rehabilitation  SUBJECTIVE:   SUBJECTIVE STATEMENT  Pt reports back pain is currently a 3/10 LBP.  Pt took some ibuprofen prior to PT. Pt reports no stumbles/falls. Pt reports low-medium energy today.  Pt accompanied by: significant other   PERTINENT HISTORY:  The pt is a pleasant 82 yo male referred to PT for dizziness. Pt known to clinic, previously seen for falls in the setting of PD.  Pt spouse present for eval and reports pt was seen by neurologist recently at [facility] following onset of dizziness about two-three weeks ago. He reports they think he has BPPV.  PT spouse reports he had blood work done at this visit which "didn't show anything" and was normal. Pt unsure if his symptoms come on when he is still at rest. He describes his symptoms as spinning. This can be triggered when he lies on his back and also when he sits up from supine. Spinning lasts for minutes. He reports no nausea.  Pt sometimes sees double when he looks to the side and this has been going on for weeks. Denies any one sided weakness or other changes. He has fallen 4 times in past 3 months. Pt spouse reports pt had a fall about a week prior to dizziness onset. Pt reports no neck pain, has hx of LBP. His spouse reports decline in his mobility since this started. Pt uses RW at baseline for ambulation. Pt now being seen primarily for PD  impairments. Pt PMH significant for Parkinson's disease, concussion, cervicalgia, depression, seizures, chronic L knee pain, chronic anxiety, DDD, PAD, chronic pain syndrome, chronic low back pain, please refer to chart for full, extensive, PMH. PAIN:  Are you having pain? No Back pain hurting prior, took 3 ibuprophen.   PRECAUTIONS: Fall   WEIGHT BEARING RESTRICTIONS: No  FALLS: Has patient fallen in last 6 months? Yes. Number of falls 4  LIVING ENVIRONMENT: via chart and confirmed by pt in session Lives with: lives with their spouse Lives in: House/apartment Stairs: 4 Has following equipment at home: Walker - 2 wheeled  PLOF:  needs assistance, pt with PD that affected his mobility/ADLs  PATIENT GOALS: get rid of dizziness  OBJECTIVE:     TREATMENT:  DATE: 12/31/22  TherEx:  Ambulation around the gym with RW, 444 ft with 2# Rates medium-hard   Seated: 2# AW donned: Seated LTL step over red bolster 10x each LE Rates hard  Seated LAQ, 10x, 5x each LE - rates difficult  STS: 2x5 Seated LAQ 5x each LE   Without AW:  Seated march 3x10 each LE  Seated DF 20x bilat Seated PF 20x bliat  RTB hip abduction 3x10 bilat LE  Seated hamstring curls with RTB 4x10 each LE Seated adductor squeeze using bolster 10x with 3 sec hold/rep   Pt given pillow on the R side and in the chair along with MHP applied to the lumbar spine to improve pain.   PATIENT EDUCATION: Education details: exercise technique  HOME EXERCISE PROGRAM: Updated: Access Code: [email or web address] URL: [email or web address] Date: 12/08/2022 Prepared by: Temple Pacini  Exercises - Seated March  - 1 x daily - 5-6 x weekly - 4 sets - 10 reps - Seated Long Arc Quad  - 1 x daily - 5-6 x weekly - 2 sets - 15 reps - Seated Heel Toe Raises  - 1 x daily - 5-6 x weekly - 2 sets - 20 reps   Access Code:  [email or web address] URL: [email or web address] Date: 12/24/2022 Prepared by: Tomasa Hose  Exercises - Standing Lumbar Extension at Wall - Forearms  - 1 x daily - 7 x weekly - 3 sets - 10 reps - Prone Press Up On Elbows  - 1 x daily - 7 x weekly - 3 sets - 10 reps - Seated Lumbar Extension  - 1 x daily - 7 x weekly - 3 sets - 10 reps  GOALS:   Goals reviewed with patient? Yes   SHORT TERM GOALS: Target date: 02/11/2023   Patient will be independent in home exercise program to improve dizziness, balance and mobility for better safety and functional independence with ADLs. Baseline: to be initiated future visit/as needed Goal status: INITIAL   LONG TERM GOALS: Target date: 03/25/2023    Patient will increase FOTO score to equal to or greater than 53    to demonstrate statistically significant improvement in mobility and quality of life.  Baseline: 44 Goal status: INITIAL  2.  Patient will reduce dizziness handicap inventory score to <50, for less dizziness with ADLs and increased safety with home and work tasks.  Baseline: deferred, will complete as pt able: 10/10: 48, still has difficulty with bed mobility  Goal status: MET  3.  The pt will report at least a 60% decrease in positional or movement triggered dizziness symptoms in order to indicate decreased fall risk and increased balance/safety with mobility. Baseline: pt presents with decreased mobility due to dizziness, dizziness/vertigo triggered by particular positional; changes; 10/10: 60-70%  Goal status: MET  4.  Patient will reduce timed up and go to <11 seconds to reduce fall risk and demonstrate improved transfer/gait ability. Baseline: 9/5: 18 sec; 10/10: 16 seconds seconds  Goal status: INITIAL  5.Patient (> 41 years old) will complete five times sit to stand test in < 15 seconds indicating an increased LE strength and improved balance. Baseline: 12/01/22: 26 sec Goal status: NEW  6. Patient will increase 10  meter walk test to >1.35m/s as to improve gait speed for better community ambulation and to reduce fall risk. Baseline: 12/01/22: 0.6 m/s with RW Goal status: NEW   ASSESSMENT:  CLINICAL IMPRESSION: Pt somewhat more fatigued today in session, reporting likely due to later appointment time than usual. Despite  fatigue, pt tolerated entirety of session fair and even progressed distance ambulated. Pt will continue to benefit from skilled therapy to address remaining deficits in order to improve overall QoL and return to PLOF.      OBJECTIVE IMPAIRMENTS: Abnormal gait, decreased activity tolerance, decreased balance, decreased coordination, decreased mobility, difficulty walking, decreased strength, dizziness, impaired vision/preception, improper body mechanics, and postural dysfunction.   ACTIVITY LIMITATIONS: lifting, bending, squatting, stairs, transfers, bed mobility, toileting, dressing, hygiene/grooming, and locomotion level  PARTICIPATION LIMITATIONS: meal prep, cleaning, laundry, shopping, community activity, and yard work  PERSONAL FACTORS: Age, Fitness, and 3+ comorbidities: Pt PMH significant for Parkinson's disease, concussion, cervicalgia, depression, seizures, chronic L knee pain, chronic anxiety, DDD, PAD, chronic pain syndrome, chronic low back pain, please refer to chart for full, extensive, PMH.  are also affecting patient's functional outcome.   REHAB POTENTIAL: Good  CLINICAL DECISION MAKING: Evolving/moderate complexity  EVALUATION COMPLEXITY: Moderate   PLAN:  PT FREQUENCY: 1-2x/week  PT DURATION: 12 weeks  PLANNED INTERVENTIONS: Therapeutic exercises, Therapeutic activity, Neuromuscular re-education, Balance training, Gait training, Patient/Family education, Self Care, Joint mobilization, Stair training, Vestibular training, Canalith repositioning, Visual/preceptual remediation/compensation, DME instructions, Electrical stimulation, Wheelchair mobility training,  Spinal mobilization, Cryotherapy, Moist heat, Splintting, Taping, Traction, Manual therapy, and Re-evaluation  PLAN FOR NEXT SESSION: attempt standing/supported PD interventions,  bed mobility (supine>seated) as able    Temple Pacini PT, DPT  Physical Therapist - [facility]  12/31/22, 5:08 PM

## 2022-12-31 NOTE — Therapy (Addendum)
OUTPATIENT OCCUPATIONAL THERAPY NEURO TREATMENT NOTE  Patient Name: Joseph Arroyo MRN: [id number] DOB:1941/01/16, 82 y.o., male Today's Date: 12/31/2022  PCP: Dr. Saralyn Pilar REFERRING PROVIDER: PCP  END OF SESSION:  OT End of Session - 12/31/22 1756     Visit Number 7    Number of Visits 24    Date for OT Re-Evaluation 03/04/23    OT Start Time 1445    OT Stop Time 1530    OT Time Calculation (min) 45 min    Activity Tolerance Patient tolerated treatment well    Behavior During Therapy [facility] for tasks assessed/performed            Past Medical History:  Diagnosis Date   Allergic rhinitis due to pollen 11/21/2007   Brachial neuritis or radiculitis NOS    Cervicalgia    Concussion    age 76 - s/p accident   Costal chondritis    Depression    Essential and other specified forms of tremor    GERD (gastroesophageal reflux disease)    in past   Lumbago    Occlusion and stenosis of carotid artery without mention of cerebral infarction    Seizures [facility])    age 44 - after concussion   Past Surgical History:  Procedure Laterality Date   CATARACT EXTRACTION Right 07/18/14   CATARACT EXTRACTION W/PHACO Left 08/01/2014   Procedure: CATARACT EXTRACTION PHACO AND INTRAOCULAR LENS PLACEMENT (IOC);  Surgeon: Lockie Mola, MD;  Location: [facility];  Service: Ophthalmology;  Laterality: Left;  IVA TOPICAL   COLONOSCOPY  2008   OTHER SURGICAL HISTORY  2002   ear surgery   STAPEDES SURGERY Right 2002   Patient Active Problem List   Diagnosis Date Noted   Dizziness 10/02/2022   Lumbar facet joint pain 06/25/2022   Osteoarthritis of facet joint of lumbar spine 06/25/2022   Lumbosacral facet joint hypertrophy 06/25/2022   Parkinson's disease with dyskinesia (HCC) 01/27/2022   Dysphagia, pharyngeal phase 09/17/2021   Cervicalgia 08/05/2021   Spondylosis without myelopathy or radiculopathy, lumbosacral region 06/24/2021   Lumbar central spinal  stenosis, w/o neurogenic claudication (L4-5) 06/03/2021   Lumbar lateral recess stenosis (Bilateral: L2-3, L4-5) (Right: L3-4) 06/03/2021   Lumbosacral foraminal stenosis (Bilateral: L2-3, L3-4) (Left: L5-S1) 06/03/2021   Lumbar nerve root impingement (Right: L3 at L2-3 & L3-4) 06/03/2021   Ligamentum flavum hypertrophy (L3-4, L4-5) 06/03/2021   Abnormal MRI, lumbar spine (04/23/2021) 04/24/2021   Long term prescription benzodiazepine use (alprazolam) (Xanax) 03/24/2021   Grade 1 Retrolisthesis of L2/L3 (5 mm) and L3/L4 (3 mm) 03/24/2021   Levoscoliosis of lumbar spine (L3-4 apex) 03/24/2021   DDD (degenerative disc disease), lumbosacral 03/24/2021   Lumbosacral facet arthropathy (Left: L3-4, L4-5, and L5-S1) 03/24/2021   Tricompartment osteoarthritis of knee (Left) 03/24/2021   Baker cyst (Left) 03/24/2021   Chronic low back pain (1ry area of Pain) (Bilateral) (R>L) w/o sciatica 03/24/2021   Lumbar facet syndrome (Bilateral) 03/24/2021   Chronic lower extremity pain (2ry area of Pain) (Bilateral) (L>R) 03/24/2021   Lumbosacral radiculitis/sensory radiculopathy at L2 (Bilateral) 03/24/2021   Lumbosacral radiculitis/sensory radiculopathy at L3 (Bilateral) 03/24/2021   Chronic pain syndrome 03/23/2021   Pharmacologic therapy 03/23/2021   Disorder of skeletal system 03/23/2021   Problems influencing health status 03/23/2021   PAD (peripheral artery disease) (HCC) 10/01/2020   GAD (generalized anxiety disorder) 01/13/2018   MDD (major depressive disorder) 01/13/2018   Umbilical hernia 09/04/2017   Chronic knee pain (Left) 08/11/2017   Derangement of  medial meniscus, posterior horn (Left) 08/11/2017   Vitamin D insufficiency 03/05/2016   BPH without obstruction/lower urinary tract symptoms 07/08/2015   Chronic fatigue 10/23/2014   Major depression, recurrent, full remission (HCC) 07/26/2013   Unsteady gait 07/26/2013   Orthostatic hypotension 07/26/2013   Depression 07/26/2013   Anxiety  06/23/2013   Chronic anxiety 06/23/2013   Tremor 05/18/2013   Bilateral carotid artery stenosis 08/30/2012   ONSET DATE: 2015  REFERRING DIAG: Parkinson's Disease  THERAPY DIAG:  Muscle weakness (generalized)  Other lack of coordination  Rationale for Evaluation and Treatment: Rehabilitation  SUBJECTIVE:  SUBJECTIVE STATEMENT: Pt verbalizes a lot of back pain today.  Moist heat used throughout session to increase comfort. Pt accompanied by: significant other, spouse, Joseph Arroyo  PERTINENT HISTORY: Hx of Parkinson's disease (HCC); Chronic knee pain (Left); Derangement of medial meniscus, posterior horn (Left); PAD (peripheral artery disease) (HCC); Chronic pain syndrome; DDD (degenerative disc disease) lumbosacral; osteoarthritis of knee (Left); Chronic low back pain, Lumbar facet syndrome (Bilateral); Chronic lower extremity pain.   PRECAUTIONS: Fall  WEIGHT BEARING RESTRICTIONS: No  PAIN:  Are you having pain? Yes: NPRS scale: 8-9/10 Pain location: low back  Pain description: aching, sometimes sharp  Aggravating factors: activity  Relieving factors: lying down, heat, ice pack  FALLS: Has patient fallen in last 6 months? Yes. Number of falls 4  LIVING ENVIRONMENT: Lives with: lives with their spouse Lives in: 1 level home Stairs: 4 steps, a landing, then 3 more steps with bilat rails.  Has following equipment at home: Dan Humphreys - 2 wheeled, Shower bench, bed side commode, and Grab bars   PLOF: Needs assistance with ADLs.  Pt performs basic self care with set up assist, extra time to manage clothing fasteners, occasional min A on days with worsening Parkinson's symptoms or back pain.    PATIENT GOALS: Pt wants to regain some strength and coordination skills in bilat hands.   OBJECTIVE:  Note: Objective measures were completed at Evaluation unless otherwise noted.  HAND DOMINANCE: Right  ADLs: Overall ADLs: Extensive time required; occasional min A from  spouse Transfers/ambulation related to ADLs: supv-modified indep with RW Eating: difficulty cutting food, struggles with loading food on a fork (uses built up foam handles at times); spouse cuts steak Grooming: electric toothbrush and standard toothbrush, but mostly uses electric; increased difficulty with brushing teeth and shaving (has electric trimmer and regular razor) UB Dressing: magnetic buttons on some shirts; more difficult to button shirts, occasional help with donning a shirt (min A)  LB Dressing: difficulty tying shoe laces, increased time; uses long shoe horn, has a hip kit; occasional assist to don socks  Toileting: modified indep/extra time  Bathing: able to take a shower with supv with tub bench Tub Shower transfers: transfer tub bench Equipment:  Pt does have hip kit and he regularly uses the long shoe horn.  Spouse reports pt may need some review with the other tools.   IADLs: Shopping: Spouse manages Light housekeeping: Spouse manages Meal Prep: Spouse manages Community mobility: Uses RW and requires frequent sitting breaks d/t back pain Medication management: modified Engineer, materials: Extra time for writing checks, with occasional assist from spouse on days when tremors are worse Handwriting: 90% legible, though pt reports this is an exceptional day today, and pt reports he's recently had to rip of checks and have spouse take over d/t poor legibility.  MOBILITY STATUS: Hx of falls; spouse reports pt to have had 4 falls in the last 6 mo  POSTURE COMMENTS:  rounded shoulders, increased thoracic kyphosis, and hx of scoliosis , L shoulder rests significantly lower than R Sitting balance: Supports self with one extremity  ACTIVITY TOLERANCE: Activity tolerance: limited by low back pain   FUNCTIONAL OUTCOME MEASURES: FOTO: TBD  UPPER EXTREMITY ROM:  BUEs WFL  UPPER EXTREMITY MMT:     MMT Right eval Left eval  Shoulder flexion 5 5  Shoulder abduction 5  5  Shoulder adduction    Shoulder extension    Shoulder internal rotation 4 4  Shoulder external rotation 4 4  Middle trapezius    Lower trapezius    Elbow flexion    Elbow extension    Wrist flexion    Wrist extension    Wrist ulnar deviation    Wrist radial deviation    Wrist pronation    Wrist supination    (Blank rows = not tested)  HAND FUNCTION: Grip strength: Right: 30 lbs; Left: 45 lbs, Lateral pinch: Right: 12 lbs, Left: 5 lbs, and 3 point pinch: Right: 10 lbs, Left: 6 lbs  COORDINATION: 9 Hole Peg test: Right: 33 sec; Left: 41 sec  SENSATION: Not tested  EDEMA: No visible edema  MUSCLE TONE: RUE: Within functional limits and LUE: Within functional limits  COGNITION: Overall cognitive status: Within functional limits for tasks assessed; spouse reports some impulsivity/poor safety awareness contributing to falls at home  VISION: No recent changes; wears glasses for reading Visual history: macular degeneration, hx of cataract removal both eyes  PERCEPTION: Not tested  PRAXIS: Impaired: Motor planning  TODAY'S TREATMENT:                                                                                                                              DATE: 12/31/22 Moist heat applied to low back with support of lumbar pillow; a second pillow wedged between R side and arm rest of chair for midline position and back pain management throughout session.  Therapeutic Ex:  Pt. Worked on BUE strengthening, and reciprocal motion using the UBE while seated for 8 min. with no resistance. Constant monitoring was provided. Support was provided with a double pillow at the right lateral side. And moist heat with a pillow at his back.  Neuromuscular re-education:   Pt. worked on [facility] skills using the [employer] Task. Pt. worked on sustaining grasp on the resistive tweezers while grasping the sticks, and moving them from a horizontal position to a vertical position to  prepare for placing them into the pegboard. Pt. required verbal cues, and cues for visual demonstration for wrist position, and hand pattern when placing them onto the pegboard.    PATIENT EDUCATION: Education details: FMC, positioning Person educated: Patient and Spouse Education method: Explanation, demo Education comprehension: verbalized understanding; further training needed  HOME EXERCISE PROGRAM: Pt instructed to use his theraputty or his hand strengthener (spring based), [facility] exercises (handout issued)  GOALS: Goals reviewed with patient? Yes  SHORT TERM GOALS: Target date: 01/21/23  Pt will perform HEP with supv for increasing strength and coordination in B hands.  Baseline: Eval: Will review previous HEP and add to as appropriate Goal status: INITIAL  2.  Pt will implement fall prevention strategies with min vc from spouse at least 50% of the time.  Baseline: Spouse reports pt with 4 falls over the last 6 months, noting decreased safety awareness/impulsivity Goal status: INITIAL  LONG TERM GOALS: Target date: 03/04/2023  Pt will increase FOTO score by (TBD) to indicate improvement in perceived functional performance with daily tasks. Baseline: Eval: FOTO to be given next session. Goal status: INITIAL  2.  Pt will utilize AE/activity modification to participate in face time calls with family [place] with set up/supv. Baseline: Eval: Pt reports he is struggling to accurately press buttons on his cell phone, often having spouse assist to manage a call (Initial recommendation at eval to transition to tablet for Face time calls to work with larger screen surface; spouse receptive) Goal status: INITIAL  3.  Pt will use adapted writing aids/utilize positioning strategies/activity modifications to enable pt to take simple notes when reading scripture and to write checks legibly. Baseline: Eval: Pt reports he has not been able to take notes in awhile d/t poor legibility and this  task requiring increased effort and fatigue. Goal status: INITIAL  4.  Pt will increase bilat grip strength by 5 or more lbs to improve ability to open containers.  Baseline: Eval: R grip: 30 lbs, L grip: 45 lbs Goal status: INITIAL  5.  Pt will improve [facility] skills to increase efficiency with manipulation of eating and grooming utensils.   Baseline: Eval: R 33 sec, L 41 sec Goal status: INITIAL  ASSESSMENT:  CLINICAL IMPRESSION:   Pt. continues to present with back pain, however responds well to the moist heat to his back, and a right lateral double pillow support. Pt. was able to consistently modulate the amount of resistance on the tweezers to grasp the 1" thin pegs, and move them from a horizontal to vertical position in preparation for placing them into the pegboard. Pt. continues to benefit from skilled OT to review/progress HEP, review/advise on activity modifications/AE, provide exercises and activities to promote hand strength and coordination skills, and reinforce fall prevention strategies to maximize safety, efficiency, and independence with daily tasks. Pt./spouse continue to be in agreement with poc.   PERFORMANCE DEFICITS: in functional skills including ADLs, IADLs, coordination, dexterity, strength, pain, Fine motor control, Gross motor control, mobility, balance, body mechanics, endurance, decreased knowledge of use of DME, and UE functional use, cognitive skills including memory and safety awareness, and psychosocial skills including coping strategies, environmental adaptation, habits, and routines and behaviors.   IMPAIRMENTS: are limiting patient from ADLs, IADLs, leisure, and social participation.   CO-MORBIDITIES: has co-morbidities such as chronic pain syndrome, spinal stenosis, DDD, dizziness   that affects occupational performance. Patient will benefit from skilled OT to address above impairments and improve overall function.  MODIFICATION OR ASSISTANCE TO COMPLETE  EVALUATION: No modification of tasks or assist necessary to complete an evaluation.  OT OCCUPATIONAL PROFILE AND HISTORY: Problem focused assessment: Including review of records relating to presenting problem.  CLINICAL DECISION MAKING: Moderate - several treatment options, min-mod task modification necessary  REHAB POTENTIAL: Good  EVALUATION COMPLEXITY: Low    PLAN:  OT FREQUENCY: 2x/week  OT DURATION: 12 weeks  PLANNED INTERVENTIONS: 97535 self care/ADL training, 11914 therapeutic exercise, 97530 therapeutic activity, 97112 neuromuscular  re-education, 97010 moist heat, 97010 cryotherapy, passive range of motion, balance training, functional mobility training, visual/perceptual remediation/compensation, psychosocial skills training, energy conservation, coping strategies training, patient/family education, and DME and/or AE instructions  RECOMMENDED OTHER SERVICES: None at this time  CONSULTED AND AGREED WITH PLAN OF CARE: family member/caregiver  PLAN FOR NEXT SESSION: FOTO; see above   Olegario Messier, MS, OTR/L  12/31/2022, 5:58 PM

## 2023-01-05 ENCOUNTER — Ambulatory Visit: Payer: Medicare HMO | Attending: Physician Assistant

## 2023-01-05 DIAGNOSIS — M5459 Other low back pain: Secondary | ICD-10-CM | POA: Insufficient documentation

## 2023-01-05 DIAGNOSIS — G20C Parkinsonism, unspecified: Secondary | ICD-10-CM | POA: Insufficient documentation

## 2023-01-05 DIAGNOSIS — R2681 Unsteadiness on feet: Secondary | ICD-10-CM | POA: Diagnosis not present

## 2023-01-05 DIAGNOSIS — M6281 Muscle weakness (generalized): Secondary | ICD-10-CM | POA: Insufficient documentation

## 2023-01-05 DIAGNOSIS — R262 Difficulty in walking, not elsewhere classified: Secondary | ICD-10-CM | POA: Insufficient documentation

## 2023-01-05 DIAGNOSIS — R278 Other lack of coordination: Secondary | ICD-10-CM | POA: Insufficient documentation

## 2023-01-05 NOTE — Therapy (Signed)
OUTPATIENT PHYSICAL THERAPY NEURO TREATMENT      Patient Name: Joseph Arroyo MRN: [id number] DOB:Aug 01, 1940, 82 y.o., male Today's Date: 01/05/2023  END OF SESSION:  PT End of Session - 01/05/23 1313     Visit Number 18    Number of Visits 33    Date for PT Re-Evaluation 02/23/23    Authorization Type [employer]    Progress Note Due on Visit 10    PT Start Time 1318    PT Stop Time 1358    PT Time Calculation (min) 40 min    Equipment Utilized During Treatment Gait belt    Activity Tolerance Patient tolerated treatment well;Patient limited by pain    Behavior During Therapy WFL for tasks assessed/performed                     Past Medical History:  Diagnosis Date   Allergic rhinitis due to pollen 11/21/2007   Brachial neuritis or radiculitis NOS    Cervicalgia    Concussion    age 25 - s/p accident   Costal chondritis    Depression    Essential and other specified forms of tremor    GERD (gastroesophageal reflux disease)    in past   Lumbago    Occlusion and stenosis of carotid artery without mention of cerebral infarction    Seizures (HCC)    age 69 - after concussion   Past Surgical History:  Procedure Laterality Date   CATARACT EXTRACTION Right 07/18/14   CATARACT EXTRACTION W/PHACO Left 08/01/2014   Procedure: CATARACT EXTRACTION PHACO AND INTRAOCULAR LENS PLACEMENT (IOC);  Surgeon: Lockie Mola, MD;  Location: [facility];  Service: Ophthalmology;  Laterality: Left;  IVA TOPICAL   COLONOSCOPY  2008   OTHER SURGICAL HISTORY  2002   ear surgery   STAPEDES SURGERY Right 2002   Patient Active Problem List   Diagnosis Date Noted   Dizziness 10/02/2022   Lumbar facet joint pain 06/25/2022   Osteoarthritis of facet joint of lumbar spine 06/25/2022   Lumbosacral facet joint hypertrophy 06/25/2022   Parkinson's disease with dyskinesia (HCC) 01/27/2022   Dysphagia, pharyngeal phase 09/17/2021   Cervicalgia 08/05/2021    Spondylosis without myelopathy or radiculopathy, lumbosacral region 06/24/2021   Lumbar central spinal stenosis, w/o neurogenic claudication (L4-5) 06/03/2021   Lumbar lateral recess stenosis (Bilateral: L2-3, L4-5) (Right: L3-4) 06/03/2021   Lumbosacral foraminal stenosis (Bilateral: L2-3, L3-4) (Left: L5-S1) 06/03/2021   Lumbar nerve root impingement (Right: L3 at L2-3 & L3-4) 06/03/2021   Ligamentum flavum hypertrophy (L3-4, L4-5) 06/03/2021   Abnormal MRI, lumbar spine (04/23/2021) 04/24/2021   Long term prescription benzodiazepine use (alprazolam) (Xanax) 03/24/2021   Grade 1 Retrolisthesis of L2/L3 (5 mm) and L3/L4 (3 mm) 03/24/2021   Levoscoliosis of lumbar spine (L3-4 apex) 03/24/2021   DDD (degenerative disc disease), lumbosacral 03/24/2021   Lumbosacral facet arthropathy (Left: L3-4, L4-5, and L5-S1) 03/24/2021   Tricompartment osteoarthritis of knee (Left) 03/24/2021   Baker cyst (Left) 03/24/2021   Chronic low back pain (1ry area of Pain) (Bilateral) (R>L) w/o sciatica 03/24/2021   Lumbar facet syndrome (Bilateral) 03/24/2021   Chronic lower extremity pain (2ry area of Pain) (Bilateral) (L>R) 03/24/2021   Lumbosacral radiculitis/sensory radiculopathy at L2 (Bilateral) 03/24/2021   Lumbosacral radiculitis/sensory radiculopathy at L3 (Bilateral) 03/24/2021   Chronic pain syndrome 03/23/2021   Pharmacologic therapy 03/23/2021   Disorder of skeletal system 03/23/2021   Problems influencing health status 03/23/2021   PAD (peripheral artery disease) (  HCC) 10/01/2020   GAD (generalized anxiety disorder) 01/13/2018   MDD (major depressive disorder) 01/13/2018   Umbilical hernia 09/04/2017   Chronic knee pain (Left) 08/11/2017   Derangement of medial meniscus, posterior horn (Left) 08/11/2017   Vitamin D insufficiency 03/05/2016   BPH without obstruction/lower urinary tract symptoms 07/08/2015   Chronic fatigue 10/23/2014   Major depression, recurrent, full remission (HCC)  07/26/2013   Unsteady gait 07/26/2013   Orthostatic hypotension 07/26/2013   Depression 07/26/2013   Anxiety 06/23/2013   Chronic anxiety 06/23/2013   Tremor 05/18/2013   Bilateral carotid artery stenosis 08/30/2012    PCP: Smitty Cords, DO  REFERRING PROVIDER:   Mecum, Oswaldo Conroy, PA-C    REFERRING DIAG: R42 (ICD-10-CM) - Dizziness   THERAPY DIAG:  Muscle weakness (generalized)  Unsteadiness on feet  Difficulty in walking, not elsewhere classified  Other low back pain  Other lack of coordination  ONSET DATE: August 1st 2024  Rationale for Evaluation and Treatment: Rehabilitation  SUBJECTIVE:   SUBJECTIVE STATEMENT  Pt reports doing well, energy level is OK at the moment. Pt says he took two ibuprofen prior to session.  Pt accompanied by: significant other   PERTINENT HISTORY:  The pt is a pleasant 82 yo male referred to PT for dizziness. Pt known to clinic, previously seen for falls in the setting of PD.  Pt spouse present for eval and reports pt was seen by neurologist recently at [facility] following onset of dizziness about two-three weeks ago. He reports they think he has BPPV.  PT spouse reports he had blood work done at this visit which "didn't show anything" and was normal. Pt unsure if his symptoms come on when he is still at rest. He describes his symptoms as spinning. This can be triggered when he lies on his back and also when he sits up from supine. Spinning lasts for minutes. He reports no nausea.  Pt sometimes sees double when he looks to the side and this has been going on for weeks. Denies any one sided weakness or other changes. He has fallen 4 times in past 3 months. Pt spouse reports pt had a fall about a week prior to dizziness onset. Pt reports no neck pain, has hx of LBP. His spouse reports decline in his mobility since this started. Pt uses RW at baseline for ambulation. Pt now being seen primarily for PD impairments. Pt PMH significant for  Parkinson's disease, concussion, cervicalgia, depression, seizures, chronic L knee pain, chronic anxiety, DDD, PAD, chronic pain syndrome, chronic low back pain, please refer to chart for full, extensive, PMH. PAIN:  Are you having pain? No Back pain hurting prior, took 3 ibuprophen.   PRECAUTIONS: Fall   WEIGHT BEARING RESTRICTIONS: No  FALLS: Has patient fallen in last 6 months? Yes. Number of falls 4  LIVING ENVIRONMENT: via chart and confirmed by pt in session Lives with: lives with their spouse Lives in: House/apartment Stairs: 4 Has following equipment at home: Walker - 2 wheeled  PLOF:  needs assistance, pt with PD that affected his mobility/ADLs  PATIENT GOALS: get rid of dizziness  OBJECTIVE:     TREATMENT:  DATE: 01/05/23  The following interventions include TherEx and NMR:   Ambulation around the gym with RW, 444 ft with 2# AW each LE Rates medium-hard   Large amplitude movement training- Seated fwd reach>reach down>reach up>reach out 10x with 5 sec hold/rep Swing BUE into shoulder flexion/upward reach 2x10 bilat UE - reports feels good to back Seated large amplitude twist and clap 10x each side  Basketball (seated balance intervention) toss with RUE and LUE x multiple reps, pt then completes with one foot on dynadisc, completed both RLE and LLE   Ambulation around the gym with RW, 444 ft with 2# AW each LE - Rates medium-hard   Seated: 2# AW donned: Seated LTL step onto dynadisc 10x each LE - continues to rate hard  Seated LAQ, 11x, 6x each LE - rates difficult  STS: 1x5   Pt given pillow on the R side and in the chair along with MHP applied to the lumbar spine to improve pain. Pt reports improvement with heat, no adverse reaction to heat.   PATIENT EDUCATION: Education details: exercise technique  HOME EXERCISE PROGRAM: Updated: Access Code:  [email or web address] URL: [email or web address] Date: 12/08/2022 Prepared by: Temple Pacini  Exercises - Seated March  - 1 x daily - 5-6 x weekly - 4 sets - 10 reps - Seated Long Arc Quad  - 1 x daily - 5-6 x weekly - 2 sets - 15 reps - Seated Heel Toe Raises  - 1 x daily - 5-6 x weekly - 2 sets - 20 reps   Access Code: [email or web address] URL: [email or web address] Date: 12/24/2022 Prepared by: Tomasa Hose  Exercises - Standing Lumbar Extension at Wall - Forearms  - 1 x daily - 7 x weekly - 3 sets - 10 reps - Prone Press Up On Elbows  - 1 x daily - 7 x weekly - 3 sets - 10 reps - Seated Lumbar Extension  - 1 x daily - 7 x weekly - 3 sets - 10 reps  GOALS:   Goals reviewed with patient? Yes   SHORT TERM GOALS: Target date: 02/16/2023   Patient will be independent in home exercise program to improve dizziness, balance and mobility for better safety and functional independence with ADLs. Baseline: to be initiated future visit/as needed Goal status: INITIAL   LONG TERM GOALS: Target date: 03/30/2023    Patient will increase FOTO score to equal to or greater than 53    to demonstrate statistically significant improvement in mobility and quality of life.  Baseline: 44 Goal status: INITIAL  2.  Patient will reduce dizziness handicap inventory score to <50, for less dizziness with ADLs and increased safety with home and work tasks.  Baseline: deferred, will complete as pt able: 10/10: 48, still has difficulty with bed mobility  Goal status: MET  3.  The pt will report at least a 60% decrease in positional or movement triggered dizziness symptoms in order to indicate decreased fall risk and increased balance/safety with mobility. Baseline: pt presents with decreased mobility due to dizziness, dizziness/vertigo triggered by particular positional; changes; 10/10: 60-70%  Goal status: MET  4.  Patient will reduce timed up and go to <11 seconds to reduce fall risk and  demonstrate improved transfer/gait ability. Baseline: 9/5: 18 sec; 10/10: 16 seconds seconds  Goal status: INITIAL  5.Patient (> 38 years old) will complete five times sit to stand test in < 15 seconds indicating an increased LE strength and improved balance. Baseline: 12/01/22: 26 sec Goal status: NEW  6. Patient will increase 10 meter walk test to >1.53m/s as to improve gait speed for better community ambulation and to reduce fall risk. Baseline: 12/01/22: 0.6 m/s with RW Goal status: NEW   ASSESSMENT:  CLINICAL IMPRESSION: Pt able to further advance distance ambulated today with weights donned. He reported only mild increase in LBP following gait, which PT addressed with use of heat to low back and seated rest. Pt continues to rate majority of interventions, balance or strength, as medium-hard. Pt will continue to benefit from skilled therapy to address remaining deficits in order to improve overall QoL and return to PLOF.      OBJECTIVE IMPAIRMENTS: Abnormal gait, decreased activity tolerance, decreased balance, decreased coordination, decreased mobility, difficulty walking, decreased strength, dizziness, impaired vision/preception, improper body mechanics, and postural dysfunction.   ACTIVITY LIMITATIONS: lifting, bending, squatting, stairs, transfers, bed mobility, toileting, dressing, hygiene/grooming, and locomotion level  PARTICIPATION LIMITATIONS: meal prep, cleaning, laundry, shopping, community activity, and yard work  PERSONAL FACTORS: Age, Fitness, and 3+ comorbidities: Pt PMH significant for Parkinson's disease, concussion, cervicalgia, depression, seizures, chronic L knee pain, chronic anxiety, DDD, PAD, chronic pain syndrome, chronic low back pain, please refer to chart for full, extensive, PMH.  are also affecting patient's functional outcome.   REHAB POTENTIAL: Good  CLINICAL DECISION MAKING: Evolving/moderate complexity  EVALUATION COMPLEXITY:  Moderate   PLAN:  PT FREQUENCY: 1-2x/week  PT DURATION: 12 weeks  PLANNED INTERVENTIONS: Therapeutic exercises, Therapeutic activity, Neuromuscular re-education, Balance training, Gait training, Patient/Family education, Self Care, Joint mobilization, Stair training, Vestibular training, Canalith repositioning, Visual/preceptual remediation/compensation, DME instructions, Electrical stimulation, Wheelchair mobility training, Spinal mobilization, Cryotherapy, Moist heat, Splintting, Taping, Traction, Manual therapy, and Re-evaluation  PLAN FOR NEXT SESSION: attempt standing/supported PD interventions,  bed mobility (supine>seated) as able    Temple Pacini PT, DPT  Physical Therapist - [facility]  01/05/23, 3:05 PM

## 2023-01-07 ENCOUNTER — Ambulatory Visit: Payer: Medicare HMO

## 2023-01-07 DIAGNOSIS — R278 Other lack of coordination: Secondary | ICD-10-CM

## 2023-01-07 DIAGNOSIS — M6281 Muscle weakness (generalized): Secondary | ICD-10-CM

## 2023-01-07 DIAGNOSIS — R262 Difficulty in walking, not elsewhere classified: Secondary | ICD-10-CM

## 2023-01-07 DIAGNOSIS — M5459 Other low back pain: Secondary | ICD-10-CM | POA: Diagnosis not present

## 2023-01-07 DIAGNOSIS — R2681 Unsteadiness on feet: Secondary | ICD-10-CM | POA: Diagnosis not present

## 2023-01-07 DIAGNOSIS — G20C Parkinsonism, unspecified: Secondary | ICD-10-CM | POA: Diagnosis not present

## 2023-01-07 NOTE — Therapy (Signed)
OUTPATIENT PHYSICAL THERAPY NEURO TREATMENT      Patient Name: Joseph Arroyo MRN: [id number] DOB:08-Feb-1941, 82 y.o., male Today's Date: 01/07/2023  END OF SESSION:  PT End of Session - 01/07/23 1359     Visit Number 19    Number of Visits 33    Date for PT Re-Evaluation 02/23/23    Authorization Type [employer]    Progress Note Due on Visit 10    PT Start Time 1402    PT Stop Time 1443    PT Time Calculation (min) 41 min    Equipment Utilized During Treatment Gait belt    Activity Tolerance Patient tolerated treatment well;Patient limited by pain    Behavior During Therapy WFL for tasks assessed/performed                     Past Medical History:  Diagnosis Date   Allergic rhinitis due to pollen 11/21/2007   Brachial neuritis or radiculitis NOS    Cervicalgia    Concussion    age 69 - s/p accident   Costal chondritis    Depression    Essential and other specified forms of tremor    GERD (gastroesophageal reflux disease)    in past   Lumbago    Occlusion and stenosis of carotid artery without mention of cerebral infarction    Seizures (HCC)    age 38 - after concussion   Past Surgical History:  Procedure Laterality Date   CATARACT EXTRACTION Right 07/18/14   CATARACT EXTRACTION W/PHACO Left 08/01/2014   Procedure: CATARACT EXTRACTION PHACO AND INTRAOCULAR LENS PLACEMENT (IOC);  Surgeon: Lockie Mola, MD;  Location: [facility];  Service: Ophthalmology;  Laterality: Left;  IVA TOPICAL   COLONOSCOPY  2008   OTHER SURGICAL HISTORY  2002   ear surgery   STAPEDES SURGERY Right 2002   Patient Active Problem List   Diagnosis Date Noted   Dizziness 10/02/2022   Lumbar facet joint pain 06/25/2022   Osteoarthritis of facet joint of lumbar spine 06/25/2022   Lumbosacral facet joint hypertrophy 06/25/2022   Parkinson's disease with dyskinesia (HCC) 01/27/2022   Dysphagia, pharyngeal phase 09/17/2021   Cervicalgia 08/05/2021    Spondylosis without myelopathy or radiculopathy, lumbosacral region 06/24/2021   Lumbar central spinal stenosis, w/o neurogenic claudication (L4-5) 06/03/2021   Lumbar lateral recess stenosis (Bilateral: L2-3, L4-5) (Right: L3-4) 06/03/2021   Lumbosacral foraminal stenosis (Bilateral: L2-3, L3-4) (Left: L5-S1) 06/03/2021   Lumbar nerve root impingement (Right: L3 at L2-3 & L3-4) 06/03/2021   Ligamentum flavum hypertrophy (L3-4, L4-5) 06/03/2021   Abnormal MRI, lumbar spine (04/23/2021) 04/24/2021   Long term prescription benzodiazepine use (alprazolam) (Xanax) 03/24/2021   Grade 1 Retrolisthesis of L2/L3 (5 mm) and L3/L4 (3 mm) 03/24/2021   Levoscoliosis of lumbar spine (L3-4 apex) 03/24/2021   DDD (degenerative disc disease), lumbosacral 03/24/2021   Lumbosacral facet arthropathy (Left: L3-4, L4-5, and L5-S1) 03/24/2021   Tricompartment osteoarthritis of knee (Left) 03/24/2021   Baker cyst (Left) 03/24/2021   Chronic low back pain (1ry area of Pain) (Bilateral) (R>L) w/o sciatica 03/24/2021   Lumbar facet syndrome (Bilateral) 03/24/2021   Chronic lower extremity pain (2ry area of Pain) (Bilateral) (L>R) 03/24/2021   Lumbosacral radiculitis/sensory radiculopathy at L2 (Bilateral) 03/24/2021   Lumbosacral radiculitis/sensory radiculopathy at L3 (Bilateral) 03/24/2021   Chronic pain syndrome 03/23/2021   Pharmacologic therapy 03/23/2021   Disorder of skeletal system 03/23/2021   Problems influencing health status 03/23/2021   PAD (peripheral artery disease) (  HCC) 10/01/2020   GAD (generalized anxiety disorder) 01/13/2018   MDD (major depressive disorder) 01/13/2018   Umbilical hernia 09/04/2017   Chronic knee pain (Left) 08/11/2017   Derangement of medial meniscus, posterior horn (Left) 08/11/2017   Vitamin D insufficiency 03/05/2016   BPH without obstruction/lower urinary tract symptoms 07/08/2015   Chronic fatigue 10/23/2014   Major depression, recurrent, full remission (HCC)  07/26/2013   Unsteady gait 07/26/2013   Orthostatic hypotension 07/26/2013   Depression 07/26/2013   Anxiety 06/23/2013   Chronic anxiety 06/23/2013   Tremor 05/18/2013   Bilateral carotid artery stenosis 08/30/2012    PCP: Smitty Cords, DO  REFERRING PROVIDER:   Mecum, Oswaldo Conroy, PA-C    REFERRING DIAG: R42 (ICD-10-CM) - Dizziness   THERAPY DIAG:  Muscle weakness (generalized)  Other lack of coordination  Difficulty in walking, not elsewhere classified  Unsteadiness on feet  Other low back pain  ONSET DATE: August 1st 2024  Rationale for Evaluation and Treatment: Rehabilitation  SUBJECTIVE:   SUBJECTIVE STATEMENT  Pt rates LBP as 3/10, pt took ibuprofen prior to session. Pt reports balance has not been good.  Pt accompanied by: significant other   PERTINENT HISTORY:  The pt is a pleasant 82 yo male referred to PT for dizziness. Pt known to clinic, previously seen for falls in the setting of PD.  Pt spouse present for eval and reports pt was seen by neurologist recently at [facility] following onset of dizziness about two-three weeks ago. He reports they think he has BPPV.  PT spouse reports he had blood work done at this visit which "didn't show anything" and was normal. Pt unsure if his symptoms come on when he is still at rest. He describes his symptoms as spinning. This can be triggered when he lies on his back and also when he sits up from supine. Spinning lasts for minutes. He reports no nausea.  Pt sometimes sees double when he looks to the side and this has been going on for weeks. Denies any one sided weakness or other changes. He has fallen 4 times in past 3 months. Pt spouse reports pt had a fall about a week prior to dizziness onset. Pt reports no neck pain, has hx of LBP. His spouse reports decline in his mobility since this started. Pt uses RW at baseline for ambulation. Pt now being seen primarily for PD impairments. Pt PMH significant for Parkinson's  disease, concussion, cervicalgia, depression, seizures, chronic L knee pain, chronic anxiety, DDD, PAD, chronic pain syndrome, chronic low back pain, please refer to chart for full, extensive, PMH. PAIN:  Are you having pain? No Back pain hurting prior, took 3 ibuprophen.   PRECAUTIONS: Fall   WEIGHT BEARING RESTRICTIONS: No  FALLS: Has patient fallen in last 6 months? Yes. Number of falls 4  LIVING ENVIRONMENT: via chart and confirmed by pt in session Lives with: lives with their spouse Lives in: House/apartment Stairs: 4 Has following equipment at home: Walker - 2 wheeled  PLOF:  needs assistance, pt with PD that affected his mobility/ADLs  PATIENT GOALS: get rid of dizziness  OBJECTIVE:     TREATMENT:  DATE: 01/07/23  The following interventions include TherEx and NMR:   Standing EO x 30 sec no UE support - rates difficult  Seated EC no UE support 2x30 sec  Seated EC no UE support alt LE march 2x10   Ambulation around the gym with RW, 444 ft with 2.5# AW each LE Rates medium-hard   Large amplitude movement training- Swing BUE into shoulder flexion/upward reach 3x10 bilat UE - reports feels good to back Seated large amplitude cross-body reach  10x Seated fwd reach>reach down>reach up>reach out 10x with 3 sec hold/rep Seated large amplitude twist and clap 10x each side - most difficult   Ambulation around the gym with RW and readings stick notes with head turns for dual cog task, 444 ft with 2.5# AW each LE Rates medium-hard   Seated: 2.5# AW donned: Seated LTL step onto green pad 10x each LE - continues to rate hard  Seated LAQ, 10x, 4x each LE - rates difficult  Seated march 6x each LE  STS: 2x6 with BUE push off chair  Seated heel raise 15x BLE Seated DF 15x BLE   Pt given pillow on the R side and in the chair along with MHP applied to the lumbar spine to improve  pain. Pt reports improvement with heat, no adverse reaction to heat.   PATIENT EDUCATION: Education details: exercise technique  HOME EXERCISE PROGRAM: Updated: Access Code: [email or web address] URL: [email or web address] Date: 12/08/2022 Prepared by: Temple Pacini  Exercises - Seated March  - 1 x daily - 5-6 x weekly - 4 sets - 10 reps - Seated Long Arc Quad  - 1 x daily - 5-6 x weekly - 2 sets - 15 reps - Seated Heel Toe Raises  - 1 x daily - 5-6 x weekly - 2 sets - 20 reps   Access Code: [email or web address] URL: [email or web address] Date: 12/24/2022 Prepared by: Tomasa Hose  Exercises - Standing Lumbar Extension at Wall - Forearms  - 1 x daily - 7 x weekly - 3 sets - 10 reps - Prone Press Up On Elbows  - 1 x daily - 7 x weekly - 3 sets - 10 reps - Seated Lumbar Extension  - 1 x daily - 7 x weekly - 3 sets - 10 reps  GOALS:   Goals reviewed with patient? Yes   SHORT TERM GOALS: Target date: 02/18/2023   Patient will be independent in home exercise program to improve dizziness, balance and mobility for better safety and functional independence with ADLs. Baseline: to be initiated future visit/as needed Goal status: INITIAL   LONG TERM GOALS: Target date: 04/01/2023    Patient will increase FOTO score to equal to or greater than 53    to demonstrate statistically significant improvement in mobility and quality of life.  Baseline: 44 Goal status: INITIAL  2.  Patient will reduce dizziness handicap inventory score to <50, for less dizziness with ADLs and increased safety with home and work tasks.  Baseline: deferred, will complete as pt able: 10/10: 48, still has difficulty with bed mobility  Goal status: MET  3.  The pt will report at least a 60% decrease in positional or movement triggered dizziness symptoms in order to indicate decreased fall risk and increased balance/safety with mobility. Baseline: pt presents with decreased mobility due to dizziness,  dizziness/vertigo triggered by particular positional; changes; 10/10: 60-70%  Goal status: MET  4.  Patient will reduce timed up and go to <11 seconds to reduce fall risk and demonstrate improved  transfer/gait ability. Baseline: 9/5: 18 sec; 10/10: 16 seconds seconds  Goal status: INITIAL  5.Patient (> 69 years old) will complete five times sit to stand test in < 15 seconds indicating an increased LE strength and improved balance. Baseline: 12/01/22: 26 sec Goal status: NEW  6. Patient will increase 10 meter walk test to >1.62m/s as to improve gait speed for better community ambulation and to reduce fall risk. Baseline: 12/01/22: 0.6 m/s with RW Goal status: NEW   ASSESSMENT:  CLINICAL IMPRESSION: Pt advances strengthening with increased weights, and progressed complexity of gait exercise with dual cog task. Rest breaks were provided throughout for pain modulation and to reduce fatigue. Pt challenged with all balance interventions. Pt will continue to benefit from skilled therapy to address remaining deficits in order to improve overall QoL and return to PLOF.      OBJECTIVE IMPAIRMENTS: Abnormal gait, decreased activity tolerance, decreased balance, decreased coordination, decreased mobility, difficulty walking, decreased strength, dizziness, impaired vision/preception, improper body mechanics, and postural dysfunction.   ACTIVITY LIMITATIONS: lifting, bending, squatting, stairs, transfers, bed mobility, toileting, dressing, hygiene/grooming, and locomotion level  PARTICIPATION LIMITATIONS: meal prep, cleaning, laundry, shopping, community activity, and yard work  PERSONAL FACTORS: Age, Fitness, and 3+ comorbidities: Pt PMH significant for Parkinson's disease, concussion, cervicalgia, depression, seizures, chronic L knee pain, chronic anxiety, DDD, PAD, chronic pain syndrome, chronic low back pain, please refer to chart for full, extensive, PMH.  are also affecting patient's functional  outcome.   REHAB POTENTIAL: Good  CLINICAL DECISION MAKING: Evolving/moderate complexity  EVALUATION COMPLEXITY: Moderate   PLAN:  PT FREQUENCY: 1-2x/week  PT DURATION: 12 weeks  PLANNED INTERVENTIONS: Therapeutic exercises, Therapeutic activity, Neuromuscular re-education, Balance training, Gait training, Patient/Family education, Self Care, Joint mobilization, Stair training, Vestibular training, Canalith repositioning, Visual/preceptual remediation/compensation, DME instructions, Electrical stimulation, Wheelchair mobility training, Spinal mobilization, Cryotherapy, Moist heat, Splintting, Taping, Traction, Manual therapy, and Re-evaluation  PLAN FOR NEXT SESSION: attempt standing/supported PD interventions, balance    Temple Pacini PT, DPT  Physical Therapist - [facility]  [facility]  01/07/23, 3:57 PM

## 2023-01-08 NOTE — Therapy (Signed)
OUTPATIENT OCCUPATIONAL THERAPY NEURO TREATMENT NOTE  Patient Name: Joseph Arroyo MRN: [id number] DOB:06/27/1940, 82 y.o., male Today's Date: 01/08/2023  PCP: Dr. Saralyn Pilar REFERRING PROVIDER: PCP  END OF SESSION:  OT End of Session - 01/08/23 1132     Visit Number 8    Number of Visits 24    Date for OT Re-Evaluation 03/04/23    Progress Note Due on Visit 10    OT Start Time 1445    OT Stop Time 1530    OT Time Calculation (min) 45 min    Equipment Utilized During Treatment RW    Activity Tolerance Patient tolerated treatment well    Behavior During Therapy [facility] for tasks assessed/performed            Past Medical History:  Diagnosis Date   Allergic rhinitis due to pollen 11/21/2007   Brachial neuritis or radiculitis NOS    Cervicalgia    Concussion    age 79 - s/p accident   Costal chondritis    Depression    Essential and other specified forms of tremor    GERD (gastroesophageal reflux disease)    in past   Lumbago    Occlusion and stenosis of carotid artery without mention of cerebral infarction    Seizures [facility])    age 2 - after concussion   Past Surgical History:  Procedure Laterality Date   CATARACT EXTRACTION Right 07/18/14   CATARACT EXTRACTION W/PHACO Left 08/01/2014   Procedure: CATARACT EXTRACTION PHACO AND INTRAOCULAR LENS PLACEMENT (IOC);  Surgeon: Lockie Mola, MD;  Location: [facility];  Service: Ophthalmology;  Laterality: Left;  IVA TOPICAL   COLONOSCOPY  2008   OTHER SURGICAL HISTORY  2002   ear surgery   STAPEDES SURGERY Right 2002   Patient Active Problem List   Diagnosis Date Noted   Dizziness 10/02/2022   Lumbar facet joint pain 06/25/2022   Osteoarthritis of facet joint of lumbar spine 06/25/2022   Lumbosacral facet joint hypertrophy 06/25/2022   Parkinson's disease with dyskinesia (HCC) 01/27/2022   Dysphagia, pharyngeal phase 09/17/2021   Cervicalgia 08/05/2021   Spondylosis without myelopathy or  radiculopathy, lumbosacral region 06/24/2021   Lumbar central spinal stenosis, w/o neurogenic claudication (L4-5) 06/03/2021   Lumbar lateral recess stenosis (Bilateral: L2-3, L4-5) (Right: L3-4) 06/03/2021   Lumbosacral foraminal stenosis (Bilateral: L2-3, L3-4) (Left: L5-S1) 06/03/2021   Lumbar nerve root impingement (Right: L3 at L2-3 & L3-4) 06/03/2021   Ligamentum flavum hypertrophy (L3-4, L4-5) 06/03/2021   Abnormal MRI, lumbar spine (04/23/2021) 04/24/2021   Long term prescription benzodiazepine use (alprazolam) (Xanax) 03/24/2021   Grade 1 Retrolisthesis of L2/L3 (5 mm) and L3/L4 (3 mm) 03/24/2021   Levoscoliosis of lumbar spine (L3-4 apex) 03/24/2021   DDD (degenerative disc disease), lumbosacral 03/24/2021   Lumbosacral facet arthropathy (Left: L3-4, L4-5, and L5-S1) 03/24/2021   Tricompartment osteoarthritis of knee (Left) 03/24/2021   Baker cyst (Left) 03/24/2021   Chronic low back pain (1ry area of Pain) (Bilateral) (R>L) w/o sciatica 03/24/2021   Lumbar facet syndrome (Bilateral) 03/24/2021   Chronic lower extremity pain (2ry area of Pain) (Bilateral) (L>R) 03/24/2021   Lumbosacral radiculitis/sensory radiculopathy at L2 (Bilateral) 03/24/2021   Lumbosacral radiculitis/sensory radiculopathy at L3 (Bilateral) 03/24/2021   Chronic pain syndrome 03/23/2021   Pharmacologic therapy 03/23/2021   Disorder of skeletal system 03/23/2021   Problems influencing health status 03/23/2021   PAD (peripheral artery disease) (HCC) 10/01/2020   GAD (generalized anxiety disorder) 01/13/2018   MDD (major depressive disorder)  01/13/2018   Umbilical hernia 09/04/2017   Chronic knee pain (Left) 08/11/2017   Derangement of medial meniscus, posterior horn (Left) 08/11/2017   Vitamin D insufficiency 03/05/2016   BPH without obstruction/lower urinary tract symptoms 07/08/2015   Chronic fatigue 10/23/2014   Major depression, recurrent, full remission (HCC) 07/26/2013   Unsteady gait 07/26/2013    Orthostatic hypotension 07/26/2013   Depression 07/26/2013   Anxiety 06/23/2013   Chronic anxiety 06/23/2013   Tremor 05/18/2013   Bilateral carotid artery stenosis 08/30/2012   ONSET DATE: 2015  REFERRING DIAG: Parkinson's Disease  THERAPY DIAG:  Muscle weakness (generalized)  Other lack of coordination  Atypical parkinsonism (HCC)  Rationale for Evaluation and Treatment: Rehabilitation  SUBJECTIVE:  SUBJECTIVE STATEMENT: Pt reports doing well today. Pt accompanied by: significant other, spouse, [name]  PERTINENT HISTORY: Hx of Parkinson's disease (HCC); Chronic knee pain (Left); Derangement of medial meniscus, posterior horn (Left); PAD (peripheral artery disease) (HCC); Chronic pain syndrome; DDD (degenerative disc disease) lumbosacral; osteoarthritis of knee (Left); Chronic low back pain, Lumbar facet syndrome (Bilateral); Chronic lower extremity pain.   PRECAUTIONS: Fall  WEIGHT BEARING RESTRICTIONS: No  PAIN:  Are you having pain? Yes: NPRS scale: 8-9/10 Pain location: low back  Pain description: aching, sometimes sharp  Aggravating factors: activity  Relieving factors: lying down, heat, ice pack  FALLS: Has patient fallen in last 6 months? Yes. Number of falls 4  LIVING ENVIRONMENT: Lives with: lives with their spouse Lives in: 1 level home Stairs: 4 steps, a landing, then 3 more steps with bilat rails.  Has following equipment at home: Dan Humphreys - 2 wheeled, Shower bench, bed side commode, and Grab bars   PLOF: Needs assistance with ADLs.  Pt performs basic self care with set up assist, extra time to manage clothing fasteners, occasional min A on days with worsening Parkinson's symptoms or back pain.    PATIENT GOALS: Pt wants to regain some strength and coordination skills in bilat hands.   OBJECTIVE:  Note: Objective measures were completed at Evaluation unless otherwise noted.  HAND DOMINANCE: Right  ADLs: Overall ADLs: Extensive time required; occasional  min A from spouse Transfers/ambulation related to ADLs: supv-modified indep with RW Eating: difficulty cutting food, struggles with loading food on a fork (uses built up foam handles at times); spouse cuts steak Grooming: electric toothbrush and standard toothbrush, but mostly uses electric; increased difficulty with brushing teeth and shaving (has electric trimmer and regular razor) UB Dressing: magnetic buttons on some shirts; more difficult to button shirts, occasional help with donning a shirt (min A)  LB Dressing: difficulty tying shoe laces, increased time; uses long shoe horn, has a hip kit; occasional assist to don socks  Toileting: modified indep/extra time  Bathing: able to take a shower with supv with tub bench Tub Shower transfers: transfer tub bench Equipment:  Pt does have hip kit and he regularly uses the long shoe horn.  Spouse reports pt may need some review with the other tools.   IADLs: Shopping: Spouse manages Light housekeeping: Spouse manages Meal Prep: Spouse manages Community mobility: Uses RW and requires frequent sitting breaks d/t back pain Medication management: modified Engineer, materials: Extra time for writing checks, with occasional assist from spouse on days when tremors are worse Handwriting: 90% legible, though pt reports this is an exceptional day today, and pt reports he's recently had to rip of checks and have spouse take over d/t poor legibility.  MOBILITY STATUS: Hx of falls; spouse reports pt to  have had 4 falls in the last 6 mo  POSTURE COMMENTS:  rounded shoulders, increased thoracic kyphosis, and hx of scoliosis , L shoulder rests significantly lower than R Sitting balance: Supports self with one extremity  ACTIVITY TOLERANCE: Activity tolerance: limited by low back pain   FUNCTIONAL OUTCOME MEASURES: FOTO: TBD  UPPER EXTREMITY ROM:  BUEs WFL  UPPER EXTREMITY MMT:     MMT Right eval Left eval  Shoulder flexion 5 5  Shoulder  abduction 5 5  Shoulder adduction    Shoulder extension    Shoulder internal rotation 4 4  Shoulder external rotation 4 4  Middle trapezius    Lower trapezius    Elbow flexion    Elbow extension    Wrist flexion    Wrist extension    Wrist ulnar deviation    Wrist radial deviation    Wrist pronation    Wrist supination    (Blank rows = not tested)  HAND FUNCTION: Grip strength: Right: 30 lbs; Left: 45 lbs, Lateral pinch: Right: 12 lbs, Left: 5 lbs, and 3 point pinch: Right: 10 lbs, Left: 6 lbs  COORDINATION: 9 Hole Peg test: Right: 33 sec; Left: 41 sec  SENSATION: Not tested  EDEMA: No visible edema  MUSCLE TONE: RUE: Within functional limits and LUE: Within functional limits  COGNITION: Overall cognitive status: Within functional limits for tasks assessed; spouse reports some impulsivity/poor safety awareness contributing to falls at home  VISION: No recent changes; wears glasses for reading Visual history: macular degeneration, hx of cataract removal both eyes  PERCEPTION: Not tested  PRAXIS: Impaired: Motor planning  TODAY'S TREATMENT:                                                                                                                              DATE: 01/07/23 Moist heat applied to low back with support of lumbar pillow; a second pillow wedged between R side and arm rest of chair for midline position and back pain management throughout session.  Therapeutic Ex: Pt worked on Copywriter, advertising tools with yellow putty using the knob turn, cap turn, peg turn, L-Bar and key turn attachments to target specific components of movements in the R hand.  Intermittent min A/min guard to stabilize tools and initiate correct gripping and movement patterns.  Neuromuscular re-education:  Facilitated R hand FMC/dexterity skills working to place grooved pegs into pegboard.  Practiced storing 1 peg in hand while placing another into pegboard, then storing  up to a row of pegs in palm, while working on translatory movements to move pegs from palm to fingertips in prep to discard them 1 at a time from palm.  Additional motor planning challenge promoted with pt rotating a peg 180* 1 at a time within fingertips with subsequent placement of peg into row below.    PATIENT EDUCATION: Education details: FMC, positioning Person educated: Patient and Spouse Education method: Explanation, demo Education comprehension: verbalized  understanding; further training needed  HOME EXERCISE PROGRAM: Pt instructed to use his theraputty or his hand strengthener (spring based), [facility] exercises (handout issued)  GOALS: Goals reviewed with patient? Yes  SHORT TERM GOALS: Target date: 01/21/23  Pt will perform HEP with supv for increasing strength and coordination in B hands.  Baseline: Eval: Will review previous HEP and add to as appropriate Goal status: INITIAL  2.  Pt will implement fall prevention strategies with min vc from spouse at least 50% of the time.  Baseline: Spouse reports pt with 4 falls over the last 6 months, noting decreased safety awareness/impulsivity Goal status: INITIAL  LONG TERM GOALS: Target date: 03/04/2023  Pt will increase FOTO score by (TBD) to indicate improvement in perceived functional performance with daily tasks. Baseline: Eval: FOTO to be given next session. Goal status: INITIAL  2.  Pt will utilize AE/activity modification to participate in face time calls with family [place] with set up/supv. Baseline: Eval: Pt reports he is struggling to accurately press buttons on his cell phone, often having spouse assist to manage a call (Initial recommendation at eval to transition to tablet for Face time calls to work with larger screen surface; spouse receptive) Goal status: INITIAL  3.  Pt will use adapted writing aids/utilize positioning strategies/activity modifications to enable pt to take simple notes when reading scripture and to  write checks legibly. Baseline: Eval: Pt reports he has not been able to take notes in awhile d/t poor legibility and this task requiring increased effort and fatigue. Goal status: INITIAL  4.  Pt will increase bilat grip strength by 5 or more lbs to improve ability to open containers.  Baseline: Eval: R grip: 30 lbs, L grip: 45 lbs Goal status: INITIAL  5.  Pt will improve [facility] skills to increase efficiency with manipulation of eating and grooming utensils.   Baseline: Eval: R 33 sec, L 41 sec Goal status: INITIAL  ASSESSMENT:  CLINICAL IMPRESSION: Good tolerance to therapeutic exercises this date, considering intermittent rest and stretching breaks to reduce UE fatigue.  Pt tolerates low reps with putty tools, and required constant monitoring and adjustment of putty width to manipulate tools successfully against resistance d/t R hand weakness.  With grooved pegs, pt tolerated low reps of more complex dexterity skills (ie rotating peg 180* within fingertips), noting worsening bradykinesia after several reps d/t fatigue and increased complexity of task.  Downgraded as needed to promote successful completion of activity.  Pt. continues to present with back pain, however responds well to the moist heat to his back, and a right lateral double pillow support.  Reviewed importance of choosing high physical demand tasks (gross or fine motor) for times of day when pt feels the most energized.  Pt acknowledged this and reports that he tends to save writing checks, for example, after dinner time when he feels the most energy.  Pt. continues to benefit from skilled OT to review/progress HEP, review/advise on activity modifications/AE, provide exercises and activities to promote hand strength and coordination skills, and reinforce fall prevention strategies to maximize safety, efficiency, and independence with daily tasks.  Pt./spouse continue to be in agreement with poc.   PERFORMANCE DEFICITS: in functional  skills including ADLs, IADLs, coordination, dexterity, strength, pain, Fine motor control, Gross motor control, mobility, balance, body mechanics, endurance, decreased knowledge of use of DME, and UE functional use, cognitive skills including memory and safety awareness, and psychosocial skills including coping strategies, environmental adaptation, habits, and routines and behaviors.  IMPAIRMENTS: are limiting patient from ADLs, IADLs, leisure, and social participation.   CO-MORBIDITIES: has co-morbidities such as chronic pain syndrome, spinal stenosis, DDD, dizziness   that affects occupational performance. Patient will benefit from skilled OT to address above impairments and improve overall function.  MODIFICATION OR ASSISTANCE TO COMPLETE EVALUATION: No modification of tasks or assist necessary to complete an evaluation.  OT OCCUPATIONAL PROFILE AND HISTORY: Problem focused assessment: Including review of records relating to presenting problem.  CLINICAL DECISION MAKING: Moderate - several treatment options, min-mod task modification necessary  REHAB POTENTIAL: Good  EVALUATION COMPLEXITY: Low    PLAN:  OT FREQUENCY: 2x/week  OT DURATION: 12 weeks  PLANNED INTERVENTIONS: 97535 self care/ADL training, 16109 therapeutic exercise, 97530 therapeutic activity, 97112 neuromuscular re-education, 97010 moist heat, 97010 cryotherapy, passive range of motion, balance training, functional mobility training, visual/perceptual remediation/compensation, psychosocial skills training, energy conservation, coping strategies training, patient/family education, and DME and/or AE instructions  RECOMMENDED OTHER SERVICES: None at this time  CONSULTED AND AGREED WITH PLAN OF CARE: family member/caregiver  PLAN FOR NEXT SESSION:   Danelle Earthly, MS, OTR/L 01/08/2023, 11:33 AM

## 2023-01-12 ENCOUNTER — Ambulatory Visit: Payer: Medicare HMO

## 2023-01-12 DIAGNOSIS — G20C Parkinsonism, unspecified: Secondary | ICD-10-CM

## 2023-01-12 DIAGNOSIS — R2681 Unsteadiness on feet: Secondary | ICD-10-CM

## 2023-01-12 DIAGNOSIS — M6281 Muscle weakness (generalized): Secondary | ICD-10-CM

## 2023-01-12 DIAGNOSIS — R278 Other lack of coordination: Secondary | ICD-10-CM | POA: Diagnosis not present

## 2023-01-12 DIAGNOSIS — R262 Difficulty in walking, not elsewhere classified: Secondary | ICD-10-CM | POA: Diagnosis not present

## 2023-01-12 DIAGNOSIS — M5459 Other low back pain: Secondary | ICD-10-CM | POA: Diagnosis not present

## 2023-01-12 NOTE — Therapy (Signed)
OUTPATIENT PHYSICAL THERAPY NEURO TREATMENT/Physical Therapy Progress Note   Dates of reporting period  12/08/2022   to   01/12/2023       Patient Name: Joseph Arroyo MRN: [id number] DOB:[birth year], 82 y.o., male Today's Date: 01/12/2023  END OF SESSION:  PT End of Session - 01/12/23 1357     Visit Number 20    Number of Visits 33    Date for PT Re-Evaluation 02/23/23    Authorization Type [employer]    Progress Note Due on Visit 10    PT Start Time 1402    PT Stop Time 1444    PT Time Calculation (min) 42 min    Equipment Utilized During Treatment Gait belt    Activity Tolerance Patient tolerated treatment well;Patient limited by pain    Behavior During Therapy WFL for tasks assessed/performed                     Past Medical History:  Diagnosis Date   Allergic rhinitis due to pollen 11/21/2007   Brachial neuritis or radiculitis NOS    Cervicalgia    Concussion    age 51 - s/p accident   Costal chondritis    Depression    Essential and other specified forms of tremor    GERD (gastroesophageal reflux disease)    in past   Lumbago    Occlusion and stenosis of carotid artery without mention of cerebral infarction    Seizures (HCC)    age 73 - after concussion   Past Surgical History:  Procedure Laterality Date   CATARACT EXTRACTION Right 07/18/14   CATARACT EXTRACTION W/PHACO Left 08/01/2014   Procedure: CATARACT EXTRACTION PHACO AND INTRAOCULAR LENS PLACEMENT (IOC);  Surgeon: Lockie Mola, MD;  Location: [facility];  Service: Ophthalmology;  Laterality: Left;  IVA TOPICAL   COLONOSCOPY  2008   OTHER SURGICAL HISTORY  2002   ear surgery   STAPEDES SURGERY Right 2002   Patient Active Problem List   Diagnosis Date Noted   Dizziness 10/02/2022   Lumbar facet joint pain 06/25/2022   Osteoarthritis of facet joint of lumbar spine 06/25/2022   Lumbosacral facet joint hypertrophy 06/25/2022   Parkinson's disease with dyskinesia  (HCC) 01/27/2022   Dysphagia, pharyngeal phase 09/17/2021   Cervicalgia 08/05/2021   Spondylosis without myelopathy or radiculopathy, lumbosacral region 06/24/2021   Lumbar central spinal stenosis, w/o neurogenic claudication (L4-5) 06/03/2021   Lumbar lateral recess stenosis (Bilateral: L2-3, L4-5) (Right: L3-4) 06/03/2021   Lumbosacral foraminal stenosis (Bilateral: L2-3, L3-4) (Left: L5-S1) 06/03/2021   Lumbar nerve root impingement (Right: L3 at L2-3 & L3-4) 06/03/2021   Ligamentum flavum hypertrophy (L3-4, L4-5) 06/03/2021   Abnormal MRI, lumbar spine (04/23/2021) 04/24/2021   Long term prescription benzodiazepine use (alprazolam) (Xanax) 03/24/2021   Grade 1 Retrolisthesis of L2/L3 (5 mm) and L3/L4 (3 mm) 03/24/2021   Levoscoliosis of lumbar spine (L3-4 apex) 03/24/2021   DDD (degenerative disc disease), lumbosacral 03/24/2021   Lumbosacral facet arthropathy (Left: L3-4, L4-5, and L5-S1) 03/24/2021   Tricompartment osteoarthritis of knee (Left) 03/24/2021   Baker cyst (Left) 03/24/2021   Chronic low back pain (1ry area of Pain) (Bilateral) (R>L) w/o sciatica 03/24/2021   Lumbar facet syndrome (Bilateral) 03/24/2021   Chronic lower extremity pain (2ry area of Pain) (Bilateral) (L>R) 03/24/2021   Lumbosacral radiculitis/sensory radiculopathy at L2 (Bilateral) 03/24/2021   Lumbosacral radiculitis/sensory radiculopathy at L3 (Bilateral) 03/24/2021   Chronic pain syndrome 03/23/2021   Pharmacologic therapy 03/23/2021  Disorder of skeletal system 03/23/2021   Problems influencing health status 03/23/2021   PAD (peripheral artery disease) (HCC) 10/01/2020   GAD (generalized anxiety disorder) 01/13/2018   MDD (major depressive disorder) 01/13/2018   Umbilical hernia 09/04/2017   Chronic knee pain (Left) 08/11/2017   Derangement of medial meniscus, posterior horn (Left) 08/11/2017   Vitamin D insufficiency 03/05/2016   BPH without obstruction/lower urinary tract symptoms 07/08/2015    Chronic fatigue 10/23/2014   Major depression, recurrent, full remission (HCC) 07/26/2013   Unsteady gait 07/26/2013   Orthostatic hypotension 07/26/2013   Depression 07/26/2013   Anxiety 06/23/2013   Chronic anxiety 06/23/2013   Tremor 05/18/2013   Bilateral carotid artery stenosis 08/30/2012    PCP: Smitty Cords, DO  REFERRING PROVIDER:   Mecum, Oswaldo Conroy, PA-C    REFERRING DIAG: R42 (ICD-10-CM) - Dizziness   THERAPY DIAG:  Muscle weakness (generalized)  Other lack of coordination  Difficulty in walking, not elsewhere classified  Unsteadiness on feet  Other low back pain  ONSET DATE: August 1st 2024  Rationale for Evaluation and Treatment: Rehabilitation  SUBJECTIVE:   SUBJECTIVE STATEMENT  Pt took pain medicine prior to appointment and rates it as a 3/10. Pt reports continued issue with visual motion (moving away) when he lays down fast. Did report some spinning the other day, but otherwise pt and spouse report dizziness is much better.  Pt accompanied by: significant other   PERTINENT HISTORY:  The pt is a pleasant 82 yo male referred to PT for dizziness. Pt known to clinic, previously seen for falls in the setting of PD.  Pt spouse present for eval and reports pt was seen by neurologist recently at [facility] following onset of dizziness about two-three weeks ago. He reports they think he has BPPV.  PT spouse reports he had blood work done at this visit which "didn't show anything" and was normal. Pt unsure if his symptoms come on when he is still at rest. He describes his symptoms as spinning. This can be triggered when he lies on his back and also when he sits up from supine. Spinning lasts for minutes. He reports no nausea.  Pt sometimes sees double when he looks to the side and this has been going on for weeks. Denies any one sided weakness or other changes. He has fallen 4 times in past 3 months. Pt spouse reports pt had a fall about a week prior to dizziness  onset. Pt reports no neck pain, has hx of LBP. His spouse reports decline in his mobility since this started. Pt uses RW at baseline for ambulation. Pt now being seen primarily for PD impairments. Pt PMH significant for Parkinson's disease, concussion, cervicalgia, depression, seizures, chronic L knee pain, chronic anxiety, DDD, PAD, chronic pain syndrome, chronic low back pain, please refer to chart for full, extensive, PMH. PAIN:  Are you having pain? No Back pain hurting prior, took 3 ibuprophen.   PRECAUTIONS: Fall   WEIGHT BEARING RESTRICTIONS: No  FALLS: Has patient fallen in last 6 months? Yes. Number of falls 4  LIVING ENVIRONMENT: via chart and confirmed by pt in session Lives with: lives with their spouse Lives in: House/apartment Stairs: 4 Has following equipment at home: Walker - 2 wheeled  PLOF:  needs assistance, pt with PD that affected his mobility/ADLs  PATIENT GOALS: get rid of dizziness  OBJECTIVE:     TREATMENT:  DATE: 01/12/23   TA:  Goal reassessment completed for progress visit. Please refer to goal section and assessment below for full details.  Review of HEP and addition to HEP (see below):  Seated march 4x10 each LE Seated LAQ 2x15 each LE Seated heel toe raises 2x20 each LE   Access Code: [email or web address] URL: [email or web address] Date: 01/12/2023 Prepared by: Temple Pacini  Exercises - Seated Shoulder Flexion Full Range Single Arm  - 1 x daily - 7 x weekly - 2 sets - 10 reps   PATIENT EDUCATION: Education details: exercise technique  HOME EXERCISE PROGRAM: Updated: Access Code: [email or web address] URL: [email or web address] Date: 01/12/2023 Prepared by: Temple Pacini  Exercises - Seated Shoulder Flexion Full Range Single Arm  - 1 x daily - 7 x weekly - 2 sets - 10 reps  Access Code: [email or web address] URL:  [email or web address] Date: 12/08/2022 Prepared by: Temple Pacini  Exercises - Seated March  - 1 x daily - 5-6 x weekly - 4 sets - 10 reps - Seated Long Arc Quad  - 1 x daily - 5-6 x weekly - 2 sets - 15 reps - Seated Heel Toe Raises  - 1 x daily - 5-6 x weekly - 2 sets - 20 reps   Access Code: [email or web address] URL: [email or web address] Date: 12/24/2022 Prepared by: Tomasa Hose  Exercises - Standing Lumbar Extension at Wall - Forearms  - 1 x daily - 7 x weekly - 3 sets - 10 reps - Prone Press Up On Elbows  - 1 x daily - 7 x weekly - 3 sets - 10 reps - Seated Lumbar Extension  - 1 x daily - 7 x weekly - 3 sets - 10 reps  GOALS:   Goals reviewed with patient? Yes   SHORT TERM GOALS: Target date: 02/23/2023   Patient will be independent in home exercise program to improve dizziness, balance and mobility for better safety and functional independence with ADLs. Baseline: to be initiated future visit/as needed 11/12:  Goal status: IN PROGRESS    LONG TERM GOALS: Target date: 04/06/2023    Patient will increase FOTO score to equal to or greater than 53    to demonstrate statistically significant improvement in mobility and quality of life.  Baseline: 44; 01/12/23: 52  Goal status: PARTIALLY MET   2.  Patient will reduce dizziness handicap inventory score to <50, for less dizziness with ADLs and increased safety with home and work tasks.  Baseline: deferred, will complete as pt able: 10/10: 48, still has difficulty with bed mobility  Goal status: MET  3.  The pt will report at least a 60% decrease in positional or movement triggered dizziness symptoms in order to indicate decreased fall risk and increased balance/safety with mobility. Baseline: pt presents with decreased mobility due to dizziness, dizziness/vertigo triggered by particular positional; changes; 10/10: 60-70%  Goal status: MET  4.  Patient will reduce timed up and go to <11 seconds to reduce fall  risk and demonstrate improved transfer/gait ability. Baseline: 9/5: 18 sec; 10/10: 16 seconds seconds; 11/12: 17.5 sec with RW  Goal status: IN PROGRESS   5.Patient (> 34 years old) will complete five times sit to stand test in < 15 seconds indicating an increased LE strength and improved balance. Baseline: 12/01/22: 26 sec; 11/12: 22.5 sec hands-free  Goal status: IN PROGRESS   6. Patient will increase 10 meter walk test to >1.84m/s as to improve gait speed for better community ambulation and to reduce fall risk. Baseline: 12/01/22: 0.6 m/s  with RW; 11/12:  0.95 m/s with RW  Goal status: PARTIALLY MET   ASSESSMENT:  CLINICAL IMPRESSION: Goal reassessment completed for progress visit. Pt making gains AEB partially meeting and FOTO goals, indicating increased gait speed/ability and improved perception of ability and QOL. Pt also made gains on 5xSTS test, indicating increased BLE strength/power. Pt saw slight decrease in performance on TUG. Pt still at increased risk for falls per 5xSTS and TUG scores. Patient's condition has the potential to improve in response to therapy. Maximum improvement is yet to be obtained. The anticipated improvement is attainable and reasonable in a generally predictable time. Pt will continue to benefit from skilled therapy to address remaining deficits in order to improve overall QoL and return to PLOF.      OBJECTIVE IMPAIRMENTS: Abnormal gait, decreased activity tolerance, decreased balance, decreased coordination, decreased mobility, difficulty walking, decreased strength, dizziness, impaired vision/preception, improper body mechanics, and postural dysfunction.   ACTIVITY LIMITATIONS: lifting, bending, squatting, stairs, transfers, bed mobility, toileting, dressing, hygiene/grooming, and locomotion level  PARTICIPATION LIMITATIONS: meal prep, cleaning, laundry, shopping, community activity, and yard work  PERSONAL FACTORS: Age, Fitness, and 3+  comorbidities: Pt PMH significant for Parkinson's disease, concussion, cervicalgia, depression, seizures, chronic L knee pain, chronic anxiety, DDD, PAD, chronic pain syndrome, chronic low back pain, please refer to chart for full, extensive, PMH.  are also affecting patient's functional outcome.   REHAB POTENTIAL: Good  CLINICAL DECISION MAKING: Evolving/moderate complexity  EVALUATION COMPLEXITY: Moderate   PLAN:  PT FREQUENCY: 1-2x/week  PT DURATION: 12 weeks  PLANNED INTERVENTIONS: Therapeutic exercises, Therapeutic activity, Neuromuscular re-education, Balance training, Gait training, Patient/Family education, Self Care, Joint mobilization, Stair training, Vestibular training, Canalith repositioning, Visual/preceptual remediation/compensation, DME instructions, Electrical stimulation, Wheelchair mobility training, Spinal mobilization, Cryotherapy, Moist heat, Splintting, Taping, Traction, Manual therapy, and Re-evaluation  PLAN FOR NEXT SESSION: attempt standing/supported PD interventions, balance    Temple Pacini PT, DPT  Physical Therapist - [facility]  [facility]  01/12/23, 4:57 PM

## 2023-01-12 NOTE — Therapy (Signed)
OUTPATIENT OCCUPATIONAL THERAPY NEURO TREATMENT NOTE  Patient Name: Joseph Arroyo MRN: [id number] DOB:Jul 05, 1940, 82 y.o., male Today's Date: 01/12/2023  PCP: Dr. Saralyn Pilar REFERRING PROVIDER: PCP  END OF SESSION:  OT End of Session - 01/12/23 1332     Visit Number 9    Number of Visits 24    Date for OT Re-Evaluation 03/04/23    Progress Note Due on Visit 10    OT Start Time 1315    OT Stop Time 1400    OT Time Calculation (min) 45 min    Equipment Utilized During Treatment RW    Activity Tolerance Patient tolerated treatment well    Behavior During Therapy [facility] for tasks assessed/performed            Past Medical History:  Diagnosis Date   Allergic rhinitis due to pollen 11/21/2007   Brachial neuritis or radiculitis NOS    Cervicalgia    Concussion    age 35 - s/p accident   Costal chondritis    Depression    Essential and other specified forms of tremor    GERD (gastroesophageal reflux disease)    in past   Lumbago    Occlusion and stenosis of carotid artery without mention of cerebral infarction    Seizures [facility])    age 29 - after concussion   Past Surgical History:  Procedure Laterality Date   CATARACT EXTRACTION Right 07/18/14   CATARACT EXTRACTION W/PHACO Left 08/01/2014   Procedure: CATARACT EXTRACTION PHACO AND INTRAOCULAR LENS PLACEMENT (IOC);  Surgeon: Lockie Mola, MD;  Location: [facility];  Service: Ophthalmology;  Laterality: Left;  IVA TOPICAL   COLONOSCOPY  2008   OTHER SURGICAL HISTORY  2002   ear surgery   STAPEDES SURGERY Right 2002   Patient Active Problem List   Diagnosis Date Noted   Dizziness 10/02/2022   Lumbar facet joint pain 06/25/2022   Osteoarthritis of facet joint of lumbar spine 06/25/2022   Lumbosacral facet joint hypertrophy 06/25/2022   Parkinson's disease with dyskinesia (HCC) 01/27/2022   Dysphagia, pharyngeal phase 09/17/2021   Cervicalgia 08/05/2021   Spondylosis without myelopathy  or radiculopathy, lumbosacral region 06/24/2021   Lumbar central spinal stenosis, w/o neurogenic claudication (L4-5) 06/03/2021   Lumbar lateral recess stenosis (Bilateral: L2-3, L4-5) (Right: L3-4) 06/03/2021   Lumbosacral foraminal stenosis (Bilateral: L2-3, L3-4) (Left: L5-S1) 06/03/2021   Lumbar nerve root impingement (Right: L3 at L2-3 & L3-4) 06/03/2021   Ligamentum flavum hypertrophy (L3-4, L4-5) 06/03/2021   Abnormal MRI, lumbar spine (04/23/2021) 04/24/2021   Long term prescription benzodiazepine use (alprazolam) (Xanax) 03/24/2021   Grade 1 Retrolisthesis of L2/L3 (5 mm) and L3/L4 (3 mm) 03/24/2021   Levoscoliosis of lumbar spine (L3-4 apex) 03/24/2021   DDD (degenerative disc disease), lumbosacral 03/24/2021   Lumbosacral facet arthropathy (Left: L3-4, L4-5, and L5-S1) 03/24/2021   Tricompartment osteoarthritis of knee (Left) 03/24/2021   Baker cyst (Left) 03/24/2021   Chronic low back pain (1ry area of Pain) (Bilateral) (R>L) w/o sciatica 03/24/2021   Lumbar facet syndrome (Bilateral) 03/24/2021   Chronic lower extremity pain (2ry area of Pain) (Bilateral) (L>R) 03/24/2021   Lumbosacral radiculitis/sensory radiculopathy at L2 (Bilateral) 03/24/2021   Lumbosacral radiculitis/sensory radiculopathy at L3 (Bilateral) 03/24/2021   Chronic pain syndrome 03/23/2021   Pharmacologic therapy 03/23/2021   Disorder of skeletal system 03/23/2021   Problems influencing health status 03/23/2021   PAD (peripheral artery disease) (HCC) 10/01/2020   GAD (generalized anxiety disorder) 01/13/2018   MDD (major depressive disorder)  01/13/2018   Umbilical hernia 09/04/2017   Chronic knee pain (Left) 08/11/2017   Derangement of medial meniscus, posterior horn (Left) 08/11/2017   Vitamin D insufficiency 03/05/2016   BPH without obstruction/lower urinary tract symptoms 07/08/2015   Chronic fatigue 10/23/2014   Major depression, recurrent, full remission (HCC) 07/26/2013   Unsteady gait 07/26/2013    Orthostatic hypotension 07/26/2013   Depression 07/26/2013   Anxiety 06/23/2013   Chronic anxiety 06/23/2013   Tremor 05/18/2013   Bilateral carotid artery stenosis 08/30/2012   ONSET DATE: 2015  REFERRING DIAG: Parkinson's Disease  THERAPY DIAG:  Muscle weakness (generalized)  Other lack of coordination  Atypical parkinsonism (HCC)  Rationale for Evaluation and Treatment: Rehabilitation  SUBJECTIVE:  SUBJECTIVE STATEMENT: Pt reports doing well today and requested to target [facility] skills this date.  Pt accompanied by: significant other, spouse, [name]  PERTINENT HISTORY: Hx of Parkinson's disease (HCC); Chronic knee pain (Left); Derangement of medial meniscus, posterior horn (Left); PAD (peripheral artery disease) (HCC); Chronic pain syndrome; DDD (degenerative disc disease) lumbosacral; osteoarthritis of knee (Left); Chronic low back pain, Lumbar facet syndrome (Bilateral); Chronic lower extremity pain.   PRECAUTIONS: Fall  WEIGHT BEARING RESTRICTIONS: No  PAIN: 01/12/23: 3/10 pain low back Are you having pain? Yes: NPRS scale: 8-9/10 Pain location: low back  Pain description: aching, sometimes sharp  Aggravating factors: activity  Relieving factors: lying down, heat, ice pack  FALLS: Has patient fallen in last 6 months? Yes. Number of falls 4  LIVING ENVIRONMENT: Lives with: lives with their spouse Lives in: 1 level home Stairs: 4 steps, a landing, then 3 more steps with bilat rails.  Has following equipment at home: Dan Humphreys - 2 wheeled, Shower bench, bed side commode, and Grab bars   PLOF: Needs assistance with ADLs.  Pt performs basic self care with set up assist, extra time to manage clothing fasteners, occasional min A on days with worsening Parkinson's symptoms or back pain.    PATIENT GOALS: Pt wants to regain some strength and coordination skills in bilat hands.   OBJECTIVE:  Note: Objective measures were completed at Evaluation unless otherwise noted.  HAND  DOMINANCE: Right  ADLs: Overall ADLs: Extensive time required; occasional min A from spouse Transfers/ambulation related to ADLs: supv-modified indep with RW Eating: difficulty cutting food, struggles with loading food on a fork (uses built up foam handles at times); spouse cuts steak Grooming: electric toothbrush and standard toothbrush, but mostly uses electric; increased difficulty with brushing teeth and shaving (has electric trimmer and regular razor) UB Dressing: magnetic buttons on some shirts; more difficult to button shirts, occasional help with donning a shirt (min A)  LB Dressing: difficulty tying shoe laces, increased time; uses long shoe horn, has a hip kit; occasional assist to don socks  Toileting: modified indep/extra time  Bathing: able to take a shower with supv with tub bench Tub Shower transfers: transfer tub bench Equipment:  Pt does have hip kit and he regularly uses the long shoe horn.  Spouse reports pt may need some review with the other tools.   IADLs: Shopping: Spouse manages Light housekeeping: Spouse manages Meal Prep: Spouse manages Community mobility: Uses RW and requires frequent sitting breaks d/t back pain Medication management: modified Engineer, materials: Extra time for writing checks, with occasional assist from spouse on days when tremors are worse Handwriting: 90% legible, though pt reports this is an exceptional day today, and pt reports he's recently had to rip of checks and have spouse take over  d/t poor legibility.  MOBILITY STATUS: Hx of falls; spouse reports pt to have had 4 falls in the last 6 mo  POSTURE COMMENTS:  rounded shoulders, increased thoracic kyphosis, and hx of scoliosis , L shoulder rests significantly lower than R Sitting balance: Supports self with one extremity  ACTIVITY TOLERANCE: Activity tolerance: limited by low back pain   FUNCTIONAL OUTCOME MEASURES: FOTO: TBD  UPPER EXTREMITY ROM:  BUEs WFL  UPPER  EXTREMITY MMT:     MMT Right eval Left eval  Shoulder flexion 5 5  Shoulder abduction 5 5  Shoulder adduction    Shoulder extension    Shoulder internal rotation 4 4  Shoulder external rotation 4 4  Middle trapezius    Lower trapezius    Elbow flexion    Elbow extension    Wrist flexion    Wrist extension    Wrist ulnar deviation    Wrist radial deviation    Wrist pronation    Wrist supination    (Blank rows = not tested)  HAND FUNCTION: Grip strength: Right: 30 lbs; Left: 45 lbs, Lateral pinch: Right: 12 lbs, Left: 5 lbs, and 3 point pinch: Right: 10 lbs, Left: 6 lbs  COORDINATION: 9 Hole Peg test: Right: 33 sec; Left: 41 sec  SENSATION: Not tested  EDEMA: No visible edema  MUSCLE TONE: RUE: Within functional limits and LUE: Within functional limits  COGNITION: Overall cognitive status: Within functional limits for tasks assessed; spouse reports some impulsivity/poor safety awareness contributing to falls at home  VISION: No recent changes; wears glasses for reading Visual history: macular degeneration, hx of cataract removal both eyes  PERCEPTION: Not tested  PRAXIS: Impaired: Motor planning  TODAY'S TREATMENT:                                                                                                                              DATE: 01/12/23 Moist heat applied to low back with support of lumbar pillow; a second pillow wedged between R side and arm rest of chair for midline position and back pain management throughout session.  Therapeutic Activity: Participation in activities to promote R/L FMC/dexterity skills: -Practiced placing/removing Jamar pegs into pegboard with narrow tweezers; alternated hands with each row.  -Worked with alphabet dice to target storing and translatory movements in R/L hands working with 3-5 dice in hand.    PATIENT EDUCATION: Education details: activities to promote FMC/dexterity skills Person educated: Patient and  Spouse Education method: Explanation, demo Education comprehension: verbalized understanding; further training needed  HOME EXERCISE PROGRAM: Pt instructed to use his theraputty or his hand strengthener (spring based), [facility] exercises (handout issued)  GOALS: Goals reviewed with patient? Yes  SHORT TERM GOALS: Target date: 01/21/23  Pt will perform HEP with supv for increasing strength and coordination in B hands.  Baseline: Eval: Will review previous HEP and add to as appropriate Goal status: INITIAL  2.  Pt will implement fall prevention strategies with  min vc from spouse at least 50% of the time.  Baseline: Spouse reports pt with 4 falls over the last 6 months, noting decreased safety awareness/impulsivity Goal status: INITIAL  LONG TERM GOALS: Target date: 03/04/2023  Pt will increase FOTO score by (TBD) to indicate improvement in perceived functional performance with daily tasks. Baseline: Eval: FOTO to be given next session. Goal status: INITIAL  2.  Pt will utilize AE/activity modification to participate in face time calls with family [place] with set up/supv. Baseline: Eval: Pt reports he is struggling to accurately press buttons on his cell phone, often having spouse assist to manage a call (Initial recommendation at eval to transition to tablet for Face time calls to work with larger screen surface; spouse receptive) Goal status: INITIAL  3.  Pt will use adapted writing aids/utilize positioning strategies/activity modifications to enable pt to take simple notes when reading scripture and to write checks legibly. Baseline: Eval: Pt reports he has not been able to take notes in awhile d/t poor legibility and this task requiring increased effort and fatigue. Goal status: INITIAL  4.  Pt will increase bilat grip strength by 5 or more lbs to improve ability to open containers.  Baseline: Eval: R grip: 30 lbs, L grip: 45 lbs Goal status: INITIAL  5.  Pt will improve [facility] skills  to increase efficiency with manipulation of eating and grooming utensils.   Baseline: Eval: R 33 sec, L 41 sec Goal status: INITIAL  ASSESSMENT:  CLINICAL IMPRESSION: Good tolerance to therapeutic activities this date, implementing frequent rest breaks by alternating R/L hands for all activities noted above.  Noted worsening motor planning/bradykinesia near end of session, but pt acknowledges and understands that fatigue from repetition and sustained focus on activities may often contribute to increased difficulties with UE coordination, and at times, worsening of tremors.  Pt. continues to benefit from skilled OT to review/progress HEP, review/advise on activity modifications/AE, provide exercises and activities to promote hand strength and coordination skills, and reinforce fall prevention strategies to maximize safety, efficiency, and independence with daily tasks.  Pt./spouse continue to be in agreement with poc.   PERFORMANCE DEFICITS: in functional skills including ADLs, IADLs, coordination, dexterity, strength, pain, Fine motor control, Gross motor control, mobility, balance, body mechanics, endurance, decreased knowledge of use of DME, and UE functional use, cognitive skills including memory and safety awareness, and psychosocial skills including coping strategies, environmental adaptation, habits, and routines and behaviors.   IMPAIRMENTS: are limiting patient from ADLs, IADLs, leisure, and social participation.   CO-MORBIDITIES: has co-morbidities such as chronic pain syndrome, spinal stenosis, DDD, dizziness   that affects occupational performance. Patient will benefit from skilled OT to address above impairments and improve overall function.  MODIFICATION OR ASSISTANCE TO COMPLETE EVALUATION: No modification of tasks or assist necessary to complete an evaluation.  OT OCCUPATIONAL PROFILE AND HISTORY: Problem focused assessment: Including review of records relating to presenting  problem.  CLINICAL DECISION MAKING: Moderate - several treatment options, min-mod task modification necessary  REHAB POTENTIAL: Good  EVALUATION COMPLEXITY: Low    PLAN:  OT FREQUENCY: 2x/week  OT DURATION: 12 weeks  PLANNED INTERVENTIONS: 97535 self care/ADL training, 28413 therapeutic exercise, 97530 therapeutic activity, 97112 neuromuscular re-education, 97010 moist heat, 97010 cryotherapy, passive range of motion, balance training, functional mobility training, visual/perceptual remediation/compensation, psychosocial skills training, energy conservation, coping strategies training, patient/family education, and DME and/or AE instructions  RECOMMENDED OTHER SERVICES: None at this time  CONSULTED AND AGREED WITH PLAN OF CARE:  family member/caregiver  PLAN FOR NEXT SESSION:   Danelle Earthly, MS, OTR/L 01/12/2023, 1:37 PM

## 2023-01-14 ENCOUNTER — Ambulatory Visit: Payer: Medicare HMO

## 2023-01-14 DIAGNOSIS — R278 Other lack of coordination: Secondary | ICD-10-CM | POA: Diagnosis not present

## 2023-01-14 DIAGNOSIS — M6281 Muscle weakness (generalized): Secondary | ICD-10-CM

## 2023-01-14 DIAGNOSIS — R2681 Unsteadiness on feet: Secondary | ICD-10-CM

## 2023-01-14 DIAGNOSIS — M5459 Other low back pain: Secondary | ICD-10-CM | POA: Diagnosis not present

## 2023-01-14 DIAGNOSIS — G20C Parkinsonism, unspecified: Secondary | ICD-10-CM | POA: Diagnosis not present

## 2023-01-14 DIAGNOSIS — R262 Difficulty in walking, not elsewhere classified: Secondary | ICD-10-CM | POA: Diagnosis not present

## 2023-01-14 NOTE — Therapy (Signed)
OUTPATIENT PHYSICAL THERAPY NEURO TREATMENT Patient Name: Joseph Arroyo MRN: [id number] DOB:07/01/1940, 82 y.o., male Today's Date: 01/14/2023  END OF SESSION:  PT End of Session - 01/14/23 1404     Visit Number 21    Number of Visits 33    Date for PT Re-Evaluation 02/23/23    Authorization Type [employer]    Progress Note Due on Visit 10    PT Start Time 1402    PT Stop Time 1442    PT Time Calculation (min) 40 min    Equipment Utilized During Treatment Gait belt    Activity Tolerance Patient tolerated treatment well;Patient limited by pain    Behavior During Therapy WFL for tasks assessed/performed                     Past Medical History:  Diagnosis Date   Allergic rhinitis due to pollen 11/21/2007   Brachial neuritis or radiculitis NOS    Cervicalgia    Concussion    age 87 - s/p accident   Costal chondritis    Depression    Essential and other specified forms of tremor    GERD (gastroesophageal reflux disease)    in past   Lumbago    Occlusion and stenosis of carotid artery without mention of cerebral infarction    Seizures (HCC)    age 52 - after concussion   Past Surgical History:  Procedure Laterality Date   CATARACT EXTRACTION Right 07/18/14   CATARACT EXTRACTION W/PHACO Left 08/01/2014   Procedure: CATARACT EXTRACTION PHACO AND INTRAOCULAR LENS PLACEMENT (IOC);  Surgeon: Lockie Mola, MD;  Location: [facility];  Service: Ophthalmology;  Laterality: Left;  IVA TOPICAL   COLONOSCOPY  2008   OTHER SURGICAL HISTORY  2002   ear surgery   STAPEDES SURGERY Right 2002   Patient Active Problem List   Diagnosis Date Noted   Dizziness 10/02/2022   Lumbar facet joint pain 06/25/2022   Osteoarthritis of facet joint of lumbar spine 06/25/2022   Lumbosacral facet joint hypertrophy 06/25/2022   Parkinson's disease with dyskinesia (HCC) 01/27/2022   Dysphagia, pharyngeal phase 09/17/2021   Cervicalgia 08/05/2021   Spondylosis  without myelopathy or radiculopathy, lumbosacral region 06/24/2021   Lumbar central spinal stenosis, w/o neurogenic claudication (L4-5) 06/03/2021   Lumbar lateral recess stenosis (Bilateral: L2-3, L4-5) (Right: L3-4) 06/03/2021   Lumbosacral foraminal stenosis (Bilateral: L2-3, L3-4) (Left: L5-S1) 06/03/2021   Lumbar nerve root impingement (Right: L3 at L2-3 & L3-4) 06/03/2021   Ligamentum flavum hypertrophy (L3-4, L4-5) 06/03/2021   Abnormal MRI, lumbar spine (04/23/2021) 04/24/2021   Long term prescription benzodiazepine use (alprazolam) (Xanax) 03/24/2021   Grade 1 Retrolisthesis of L2/L3 (5 mm) and L3/L4 (3 mm) 03/24/2021   Levoscoliosis of lumbar spine (L3-4 apex) 03/24/2021   DDD (degenerative disc disease), lumbosacral 03/24/2021   Lumbosacral facet arthropathy (Left: L3-4, L4-5, and L5-S1) 03/24/2021   Tricompartment osteoarthritis of knee (Left) 03/24/2021   Baker cyst (Left) 03/24/2021   Chronic low back pain (1ry area of Pain) (Bilateral) (R>L) w/o sciatica 03/24/2021   Lumbar facet syndrome (Bilateral) 03/24/2021   Chronic lower extremity pain (2ry area of Pain) (Bilateral) (L>R) 03/24/2021   Lumbosacral radiculitis/sensory radiculopathy at L2 (Bilateral) 03/24/2021   Lumbosacral radiculitis/sensory radiculopathy at L3 (Bilateral) 03/24/2021   Chronic pain syndrome 03/23/2021   Pharmacologic therapy 03/23/2021   Disorder of skeletal system 03/23/2021   Problems influencing health status 03/23/2021   PAD (peripheral artery disease) (HCC) 10/01/2020   GAD (  generalized anxiety disorder) 01/13/2018   MDD (major depressive disorder) 01/13/2018   Umbilical hernia 09/04/2017   Chronic knee pain (Left) 08/11/2017   Derangement of medial meniscus, posterior horn (Left) 08/11/2017   Vitamin D insufficiency 03/05/2016   BPH without obstruction/lower urinary tract symptoms 07/08/2015   Chronic fatigue 10/23/2014   Major depression, recurrent, full remission (HCC) 07/26/2013    Unsteady gait 07/26/2013   Orthostatic hypotension 07/26/2013   Depression 07/26/2013   Anxiety 06/23/2013   Chronic anxiety 06/23/2013   Tremor 05/18/2013   Bilateral carotid artery stenosis 08/30/2012    PCP: Smitty Cords, DO  REFERRING PROVIDER:   Mecum, Oswaldo Conroy, PA-C    REFERRING DIAG: R42 (ICD-10-CM) - Dizziness   THERAPY DIAG:  Difficulty in walking, not elsewhere classified  Muscle weakness (generalized)  Unsteadiness on feet  Other low back pain  ONSET DATE: August 1st 2024  Rationale for Evaluation and Treatment: Rehabilitation  SUBJECTIVE:   SUBJECTIVE STATEMENT  Pt took pain medicine prior to appointment and rates LBP as a 3-4/10 currently. Pt says his balance feels significantly better compared to when he first started PT.  Pt accompanied by: significant other   PERTINENT HISTORY:  The pt is a pleasant 82 yo male referred to PT for dizziness. Pt known to clinic, previously seen for falls in the setting of PD.  Pt spouse present for eval and reports pt was seen by neurologist recently at [facility] following onset of dizziness about two-three weeks ago. He reports they think he has BPPV.  PT spouse reports he had blood work done at this visit which "didn't show anything" and was normal. Pt unsure if his symptoms come on when he is still at rest. He describes his symptoms as spinning. This can be triggered when he lies on his back and also when he sits up from supine. Spinning lasts for minutes. He reports no nausea.  Pt sometimes sees double when he looks to the side and this has been going on for weeks. Denies any one sided weakness or other changes. He has fallen 4 times in past 3 months. Pt spouse reports pt had a fall about a week prior to dizziness onset. Pt reports no neck pain, has hx of LBP. His spouse reports decline in his mobility since this started. Pt uses RW at baseline for ambulation. Pt now being seen primarily for PD impairments. Pt PMH  significant for Parkinson's disease, concussion, cervicalgia, depression, seizures, chronic L knee pain, chronic anxiety, DDD, PAD, chronic pain syndrome, chronic low back pain, please refer to chart for full, extensive, PMH. PAIN:  Are you having pain? No Back pain hurting prior, took 3 ibuprophen.   PRECAUTIONS: Fall   WEIGHT BEARING RESTRICTIONS: No  FALLS: Has patient fallen in last 6 months? Yes. Number of falls 4  LIVING ENVIRONMENT: via chart and confirmed by pt in session Lives with: lives with their spouse Lives in: House/apartment Stairs: 4 Has following equipment at home: Walker - 2 wheeled  PLOF:  needs assistance, pt with PD that affected his mobility/ADLs  PATIENT GOALS: get rid of dizziness  OBJECTIVE:     TREATMENT:  DATE: 01/14/23  Gait belt donned throughout for pt safety  The following interventions include TherEx and NMR:    Large amplitude movement training- Swing BUE into shoulder flexion/upward reach 1x10 bilat UE -somewhat more limited today due to LBP Seated large amplitude cross-body reach  10x each way Ambulation around the gym with RW cuing for proximity to walker, focus on technique with RW, and strength/endurance x 444 ft Rates medium Seated fwd reach>reach down>reach up>reach out 15x - provided printout of exercise   At support surface- Standing EC WBOS 30 sec - steady Standing WBOS EO horizontal and vertical head turns 10x for each Comments: CGA provided throughout   Ambulation around the gym with RW,5# aw donned each LE, cuing for proximity to walker, also performed for strength/endurance x  296 ft Rates medium  Obstacle course, pt wearing 5# aw each LE, weaving around cones and stepping over half bolster and green pad, focus on maintaining proximity to walker throughout, especially with turns x multiple reps   PATIENT EDUCATION: Education  details: exercise technique, HEP update  HOME EXERCISE PROGRAM: Updated: Access Code: [email or web address] URL: [email or web address] Date: 01/12/2023 Prepared by: Temple Pacini  Exercises - Seated Shoulder Flexion Full Range Single Arm  - 1 x daily - 7 x weekly - 2 sets - 10 reps  Access Code: [email or web address] URL: [email or web address] Date: 12/08/2022 Prepared by: Temple Pacini  Exercises - Seated March  - 1 x daily - 5-6 x weekly - 4 sets - 10 reps - Seated Long Arc Quad  - 1 x daily - 5-6 x weekly - 2 sets - 15 reps - Seated Heel Toe Raises  - 1 x daily - 5-6 x weekly - 2 sets - 20 reps   Access Code: [email or web address] URL: [email or web address] Date: 12/24/2022 Prepared by: Tomasa Hose  Exercises - Standing Lumbar Extension at Wall - Forearms  - 1 x daily - 7 x weekly - 3 sets - 10 reps - Prone Press Up On Elbows  - 1 x daily - 7 x weekly - 3 sets - 10 reps - Seated Lumbar Extension  - 1 x daily - 7 x weekly - 3 sets - 10 reps  GOALS:   Goals reviewed with patient? Yes   SHORT TERM GOALS: Target date: 02/25/2023   Patient will be independent in home exercise program to improve dizziness, balance and mobility for better safety and functional independence with ADLs. Baseline: to be initiated future visit/as needed 11/12:  Goal status: IN PROGRESS    LONG TERM GOALS: Target date: 04/08/2023    Patient will increase FOTO score to equal to or greater than 53    to demonstrate statistically significant improvement in mobility and quality of life.  Baseline: 44; 01/12/23: 52  Goal status: PARTIALLY MET   2.  Patient will reduce dizziness handicap inventory score to <50, for less dizziness with ADLs and increased safety with home and work tasks.  Baseline: deferred, will complete as pt able: 10/10: 48, still has difficulty with bed mobility  Goal status: MET  3.  The pt will report at least a 60% decrease in positional or movement triggered dizziness  symptoms in order to indicate decreased fall risk and increased balance/safety with mobility. Baseline: pt presents with decreased mobility due to dizziness, dizziness/vertigo triggered by particular positional; changes; 10/10: 60-70%  Goal status: MET  4.  Patient will reduce timed up and go to <11 seconds to reduce fall risk and demonstrate improved transfer/gait ability. Baseline: 9/5: 18  sec; 10/10: 16 seconds seconds; 11/12: 17.5 sec with RW  Goal status: IN PROGRESS   5.Patient (> 40 years old) will complete five times sit to stand test in < 15 seconds indicating an increased LE strength and improved balance. Baseline: 12/01/22: 26 sec; 11/12: 22.5 sec hands-free  Goal status: IN PROGRESS   6. Patient will increase 10 meter walk test to >1.64m/s as to improve gait speed for better community ambulation and to reduce fall risk. Baseline: 12/01/22: 0.6 m/s with RW; 11/12:  0.95 m/s with RW  Goal status: PARTIALLY MET   ASSESSMENT:  CLINICAL IMPRESSION: Pt continues to advance gait/strength training of BLE. He was observed to have difficulty maintaining close proximity to RW with turns and required frequent cues to correct. PT addressed this with obstacle course intervention. Will continue to address this deficit. Pt will continue to benefit from skilled therapy to address remaining deficits in order to improve overall QoL and return to PLOF.      OBJECTIVE IMPAIRMENTS: Abnormal gait, decreased activity tolerance, decreased balance, decreased coordination, decreased mobility, difficulty walking, decreased strength, dizziness, impaired vision/preception, improper body mechanics, and postural dysfunction.   ACTIVITY LIMITATIONS: lifting, bending, squatting, stairs, transfers, bed mobility, toileting, dressing, hygiene/grooming, and locomotion level  PARTICIPATION LIMITATIONS: meal prep, cleaning, laundry, shopping, community activity, and yard work  PERSONAL FACTORS: Age, Fitness, and 3+  comorbidities: Pt PMH significant for Parkinson's disease, concussion, cervicalgia, depression, seizures, chronic L knee pain, chronic anxiety, DDD, PAD, chronic pain syndrome, chronic low back pain, please refer to chart for full, extensive, PMH.  are also affecting patient's functional outcome.   REHAB POTENTIAL: Good  CLINICAL DECISION MAKING: Evolving/moderate complexity  EVALUATION COMPLEXITY: Moderate   PLAN:  PT FREQUENCY: 1-2x/week  PT DURATION: 12 weeks  PLANNED INTERVENTIONS: Therapeutic exercises, Therapeutic activity, Neuromuscular re-education, Balance training, Gait training, Patient/Family education, Self Care, Joint mobilization, Stair training, Vestibular training, Canalith repositioning, Visual/preceptual remediation/compensation, DME instructions, Electrical stimulation, Wheelchair mobility training, Spinal mobilization, Cryotherapy, Moist heat, Splintting, Taping, Traction, Manual therapy, and Re-evaluation  PLAN FOR NEXT SESSION: attempt standing/supported PD interventions, balance    Temple Pacini PT, DPT  Physical Therapist - [facility]  [facility]  01/14/23, 3:36 PM

## 2023-01-14 NOTE — Therapy (Signed)
OUTPATIENT OCCUPATIONAL THERAPY NEURO PROGRESS AND TREATMENT NOTE Reporting period beginning 12/10/22-01/14/23  Patient Name: Joseph Arroyo MRN: [id number] DOB:Mar 03, 1940, 82 y.o., male Today's Date: 01/17/2023  PCP: Dr. Saralyn Pilar REFERRING PROVIDER: PCP  END OF SESSION:  OT End of Session - 01/17/23 1158     Visit Number 10    Number of Visits 24    Date for OT Re-Evaluation 03/04/23    Progress Note Due on Visit 10    OT Start Time 1315    OT Stop Time 1400    OT Time Calculation (min) 45 min    Equipment Utilized During Treatment RW    Activity Tolerance Patient tolerated treatment well    Behavior During Therapy [facility] for tasks assessed/performed            Past Medical History:  Diagnosis Date   Allergic rhinitis due to pollen 11/21/2007   Brachial neuritis or radiculitis NOS    Cervicalgia    Concussion    age 62 - s/p accident   Costal chondritis    Depression    Essential and other specified forms of tremor    GERD (gastroesophageal reflux disease)    in past   Lumbago    Occlusion and stenosis of carotid artery without mention of cerebral infarction    Seizures [facility])    age 37 - after concussion   Past Surgical History:  Procedure Laterality Date   CATARACT EXTRACTION Right 07/18/14   CATARACT EXTRACTION W/PHACO Left 08/01/2014   Procedure: CATARACT EXTRACTION PHACO AND INTRAOCULAR LENS PLACEMENT (IOC);  Surgeon: Lockie Mola, MD;  Location: [facility];  Service: Ophthalmology;  Laterality: Left;  IVA TOPICAL   COLONOSCOPY  2008   OTHER SURGICAL HISTORY  2002   ear surgery   STAPEDES SURGERY Right 2002   Patient Active Problem List   Diagnosis Date Noted   Dizziness 10/02/2022   Lumbar facet joint pain 06/25/2022   Osteoarthritis of facet joint of lumbar spine 06/25/2022   Lumbosacral facet joint hypertrophy 06/25/2022   Parkinson's disease with dyskinesia (HCC) 01/27/2022   Dysphagia, pharyngeal phase 09/17/2021    Cervicalgia 08/05/2021   Spondylosis without myelopathy or radiculopathy, lumbosacral region 06/24/2021   Lumbar central spinal stenosis, w/o neurogenic claudication (L4-5) 06/03/2021   Lumbar lateral recess stenosis (Bilateral: L2-3, L4-5) (Right: L3-4) 06/03/2021   Lumbosacral foraminal stenosis (Bilateral: L2-3, L3-4) (Left: L5-S1) 06/03/2021   Lumbar nerve root impingement (Right: L3 at L2-3 & L3-4) 06/03/2021   Ligamentum flavum hypertrophy (L3-4, L4-5) 06/03/2021   Abnormal MRI, lumbar spine (04/23/2021) 04/24/2021   Long term prescription benzodiazepine use (alprazolam) (Xanax) 03/24/2021   Grade 1 Retrolisthesis of L2/L3 (5 mm) and L3/L4 (3 mm) 03/24/2021   Levoscoliosis of lumbar spine (L3-4 apex) 03/24/2021   DDD (degenerative disc disease), lumbosacral 03/24/2021   Lumbosacral facet arthropathy (Left: L3-4, L4-5, and L5-S1) 03/24/2021   Tricompartment osteoarthritis of knee (Left) 03/24/2021   Baker cyst (Left) 03/24/2021   Chronic low back pain (1ry area of Pain) (Bilateral) (R>L) w/o sciatica 03/24/2021   Lumbar facet syndrome (Bilateral) 03/24/2021   Chronic lower extremity pain (2ry area of Pain) (Bilateral) (L>R) 03/24/2021   Lumbosacral radiculitis/sensory radiculopathy at L2 (Bilateral) 03/24/2021   Lumbosacral radiculitis/sensory radiculopathy at L3 (Bilateral) 03/24/2021   Chronic pain syndrome 03/23/2021   Pharmacologic therapy 03/23/2021   Disorder of skeletal system 03/23/2021   Problems influencing health status 03/23/2021   PAD (peripheral artery disease) (HCC) 10/01/2020   GAD (generalized anxiety disorder) 01/13/2018  MDD (major depressive disorder) 01/13/2018   Umbilical hernia 09/04/2017   Chronic knee pain (Left) 08/11/2017   Derangement of medial meniscus, posterior horn (Left) 08/11/2017   Vitamin D insufficiency 03/05/2016   BPH without obstruction/lower urinary tract symptoms 07/08/2015   Chronic fatigue 10/23/2014   Major depression, recurrent,  full remission (HCC) 07/26/2013   Unsteady gait 07/26/2013   Orthostatic hypotension 07/26/2013   Depression 07/26/2013   Anxiety 06/23/2013   Chronic anxiety 06/23/2013   Tremor 05/18/2013   Bilateral carotid artery stenosis 08/30/2012   ONSET DATE: 2015  REFERRING DIAG: Parkinson's Disease  THERAPY DIAG:  Muscle weakness (generalized)  Other lack of coordination  Atypical parkinsonism (HCC)  Rationale for Evaluation and Treatment: Rehabilitation  SUBJECTIVE:  SUBJECTIVE STATEMENT: Pt reports doing well today.  Pt accompanied by: significant other, spouse, [name]  PERTINENT HISTORY: Hx of Parkinson's disease (HCC); Chronic knee pain (Left); Derangement of medial meniscus, posterior horn (Left); PAD (peripheral artery disease) (HCC); Chronic pain syndrome; DDD (degenerative disc disease) lumbosacral; osteoarthritis of knee (Left); Chronic low back pain, Lumbar facet syndrome (Bilateral); Chronic lower extremity pain.   PRECAUTIONS: Fall  WEIGHT BEARING RESTRICTIONS: No  PAIN: 01/14/23: 4/10 pain low back Are you having pain? Yes: NPRS scale: 8-9/10 Pain location: low back  Pain description: aching, sometimes sharp  Aggravating factors: activity  Relieving factors: lying down, heat, ice pack  FALLS: Has patient fallen in last 6 months? Yes. Number of falls 4  LIVING ENVIRONMENT: Lives with: lives with their spouse Lives in: 1 level home Stairs: 4 steps, a landing, then 3 more steps with bilat rails.  Has following equipment at home: Dan Humphreys - 2 wheeled, Shower bench, bed side commode, and Grab bars   PLOF: Needs assistance with ADLs.  Pt performs basic self care with set up assist, extra time to manage clothing fasteners, occasional min A on days with worsening Parkinson's symptoms or back pain.    PATIENT GOALS: Pt wants to regain some strength and coordination skills in bilat hands.   OBJECTIVE:  Note: Objective measures were completed at Evaluation unless otherwise  noted.  HAND DOMINANCE: Right  ADLs: Overall ADLs: Extensive time required; occasional min A from spouse Transfers/ambulation related to ADLs: supv-modified indep with RW Eating: difficulty cutting food, struggles with loading food on a fork (uses built up foam handles at times); spouse cuts steak Grooming: electric toothbrush and standard toothbrush, but mostly uses electric; increased difficulty with brushing teeth and shaving (has electric trimmer and regular razor) UB Dressing: magnetic buttons on some shirts; more difficult to button shirts, occasional help with donning a shirt (min A)  LB Dressing: difficulty tying shoe laces, increased time; uses long shoe horn, has a hip kit; occasional assist to don socks  Toileting: modified indep/extra time  Bathing: able to take a shower with supv with tub bench Tub Shower transfers: transfer tub bench Equipment:  Pt does have hip kit and he regularly uses the long shoe horn.  Spouse reports pt may need some review with the other tools.   IADLs: Shopping: Spouse manages Light housekeeping: Spouse manages Meal Prep: Spouse manages Community mobility: Uses RW and requires frequent sitting breaks d/t back pain Medication management: modified Engineer, materials: Extra time for writing checks, with occasional assist from spouse on days when tremors are worse Handwriting: 90% legible, though pt reports this is an exceptional day today, and pt reports he's recently had to rip of checks and have spouse take over d/t poor legibility.  MOBILITY STATUS: Hx of falls; spouse reports pt to have had 4 falls in the last 6 mo  POSTURE COMMENTS:  rounded shoulders, increased thoracic kyphosis, and hx of scoliosis , L shoulder rests significantly lower than R Sitting balance: Supports self with one extremity  ACTIVITY TOLERANCE: Activity tolerance: limited by low back pain   FUNCTIONAL OUTCOME MEASURES: FOTO: TBD  (PT managing FOTO)  UPPER  EXTREMITY ROM:  BUEs WFL  UPPER EXTREMITY MMT:     MMT Right eval Left eval  Shoulder flexion 5 5  Shoulder abduction 5 5  Shoulder adduction    Shoulder extension    Shoulder internal rotation 4 4  Shoulder external rotation 4 4  Middle trapezius    Lower trapezius    Elbow flexion    Elbow extension    Wrist flexion    Wrist extension    Wrist ulnar deviation    Wrist radial deviation    Wrist pronation    Wrist supination    (Blank rows = not tested)  HAND FUNCTION: Eval: Grip strength: Right: 30 lbs; Left: 45 lbs, Lateral pinch: Right: 12 lbs, Left: 5 lbs, and 3 point pinch: Right: 10 lbs, Left: 6 lbs 01/14/23: Grip strength: Right: 38 lbs; Left: 39 lbs, Lateral pinch: Right: 11 lbs, Left: 5 lbs, and 3 point pinch: Right: 9 lbs, Left: 5 lbs  COORDINATION: Eval: 9 Hole Peg test: Right: 33 sec; Left: 41 sec 01/14/23: 9 Hole Peg test: Right: 28 sec; Left: 29 sec  SENSATION: Not tested  EDEMA: No visible edema  MUSCLE TONE: RUE: Within functional limits and LUE: Within functional limits  COGNITION: Overall cognitive status: Within functional limits for tasks assessed; spouse reports some impulsivity/poor safety awareness contributing to falls at home  VISION: No recent changes; wears glasses for reading Visual history: macular degeneration, hx of cataract removal both eyes  PERCEPTION: Not tested  PRAXIS: Impaired: Motor planning  TODAY'S TREATMENT:                                                                                                                              DATE: 01/14/23 Moist heat applied to low back with support of lumbar pillow; a second pillow wedged between R side and arm rest of chair for midline position and back pain management throughout session.  Therapeutic Activity: Objective measures taken and goals updated and reviewed for progress note.  Therapeutic Exercise: -Facilitated hand strengthening with use of hand gripper set at 11.2#  on the R hand and 17.9# on the L hand to remove jumbo pegs from pegboard x2 trials each hand.  -Instructed pt in bilat shoulder strengthening to target ER/IR; visual handouts issued.  Able to perform with min vc for technique.    PATIENT EDUCATION: Education details: Progress towards goals; fall prevention (advised on non-skid footwear and consistent use of walker in the home) Person educated: Patient and Spouse Education method: Explanation Education comprehension: verbalized understanding  HOME EXERCISE PROGRAM: Pt instructed to use his theraputty or his hand strengthener (spring based), [facility] exercises (handout issued), red theraband for bilat shoulder strengthening (ER/IR)  GOALS: Goals reviewed with patient? Yes  SHORT TERM GOALS: Target date: 01/21/23  Pt will perform HEP with supv for increasing strength and coordination in B hands.  Baseline: Eval: Will review previous HEP and add to as appropriate; 01/14/23: Pt using theraputty; added theraband today to target ER/IR strengthening at bilat shoulders Goal status: in progress   2.  Pt will implement fall prevention strategies with min vc from spouse at least 50% of the time.  Baseline: Spouse reports pt with 4 falls over the last 6 months, noting decreased safety awareness/impulsivity; 01/14/23: spouse reports inconsistent use of walker at times and pt walks in socks without grips.  Goal status: in progress  LONG TERM GOALS: Target date: 03/04/2023  Pt will increase FOTO score by (TBD) to indicate improvement in perceived functional performance with daily tasks. Baseline: Eval: FOTO to be given next session; 01/14/23: OT FOTO same as PT (PT managing) Goal status: in progress  2.  Pt will utilize AE/activity modification to participate in face time calls with family [place] with set up/supv. Baseline: Eval: Pt reports he is struggling to accurately press buttons on his cell phone, often having spouse assist to manage a call (Initial  recommendation at eval to transition to tablet for Face time calls to work with larger screen surface; spouse receptive); 03/15/22: No consistent changes implemented yet Goal status: in progress  3.  Pt will use adapted writing aids/utilize positioning strategies/activity modifications to enable pt to take simple notes when reading scripture and to write checks legibly. Baseline: Eval: Pt reports he has not been able to take notes in awhile d/t poor legibility and this task requiring increased effort and fatigue; 01/14/23: Pt tries to write checks as needed in the evening (more energy this time of day); variety of adapted pens have been tried; legibility inconsistent Goal status: in progress  4.  Pt will increase bilat grip strength by 5 or more lbs to improve ability to open containers.  Baseline: Eval: R grip: 30 lbs, L grip: 45 lbs; 01/14/23: R 38 lbs, L 39 lbs Goal status: in progress  5.  Pt will improve [facility] skills to increase efficiency with manipulation of eating and grooming utensils.   Baseline: Eval: R 33 sec, L 41 sec; 01/14/23: R 28 sec; L 29 sec Goal status: in progress  ASSESSMENT:  CLINICAL IMPRESSION: Pt seen for 10th visit progress update.  Noted good gains bilaterally with [facility] as demonstrated by 5 sec improvement on the R and 12 sec improvement on the R for 9 hole peg test.  R grip strength has improved slightly (8 lbs), though L decreased by 6 lbs.  Pt continues to verbalize wanting to work on arm strength, though reviewed measures with pt and bilat shoulders are 5/5 except for IR/ER bilaterally.  OT issued red theraband and instructed in exercises to target these movements, and will plan to review these, but not focus on these during session d/t pt measuring at 4/5 (good).  OT advised on benefits of continuing to target hand strength for ADLs d/t weakness noted from objective measures.  Pt is in agreement with this plan.  Pt. continues to benefit from skilled OT to  review/progress HEP, review/advise on activity modifications/AE, provide exercises and activities to promote hand strength and coordination skills, and reinforce fall prevention strategies to maximize safety,  efficiency, and independence with daily tasks.  Pt./spouse continue to be in agreement with poc.   PERFORMANCE DEFICITS: in functional skills including ADLs, IADLs, coordination, dexterity, strength, pain, Fine motor control, Gross motor control, mobility, balance, body mechanics, endurance, decreased knowledge of use of DME, and UE functional use, cognitive skills including memory and safety awareness, and psychosocial skills including coping strategies, environmental adaptation, habits, and routines and behaviors.   IMPAIRMENTS: are limiting patient from ADLs, IADLs, leisure, and social participation.   CO-MORBIDITIES: has co-morbidities such as chronic pain syndrome, spinal stenosis, DDD, dizziness   that affects occupational performance. Patient will benefit from skilled OT to address above impairments and improve overall function.  MODIFICATION OR ASSISTANCE TO COMPLETE EVALUATION: No modification of tasks or assist necessary to complete an evaluation.  OT OCCUPATIONAL PROFILE AND HISTORY: Problem focused assessment: Including review of records relating to presenting problem.  CLINICAL DECISION MAKING: Moderate - several treatment options, min-mod task modification necessary  REHAB POTENTIAL: Good  EVALUATION COMPLEXITY: Low    PLAN:  OT FREQUENCY: 2x/week  OT DURATION: 12 weeks  PLANNED INTERVENTIONS: 97535 self care/ADL training, 16109 therapeutic exercise, 97530 therapeutic activity, 97112 neuromuscular re-education, 97010 moist heat, 97010 cryotherapy, passive range of motion, balance training, functional mobility training, visual/perceptual remediation/compensation, psychosocial skills training, energy conservation, coping strategies training, patient/family education, and  DME and/or AE instructions  RECOMMENDED OTHER SERVICES: None at this time  CONSULTED AND AGREED WITH PLAN OF CARE: family member/caregiver  PLAN FOR NEXT SESSION:   Danelle Earthly, MS, OTR/L 01/17/2023, 12:00 PM

## 2023-01-19 ENCOUNTER — Ambulatory Visit: Payer: Medicare HMO

## 2023-01-19 DIAGNOSIS — G20C Parkinsonism, unspecified: Secondary | ICD-10-CM

## 2023-01-19 DIAGNOSIS — R262 Difficulty in walking, not elsewhere classified: Secondary | ICD-10-CM

## 2023-01-19 DIAGNOSIS — M6281 Muscle weakness (generalized): Secondary | ICD-10-CM

## 2023-01-19 DIAGNOSIS — R278 Other lack of coordination: Secondary | ICD-10-CM

## 2023-01-19 DIAGNOSIS — R2681 Unsteadiness on feet: Secondary | ICD-10-CM

## 2023-01-19 DIAGNOSIS — M5459 Other low back pain: Secondary | ICD-10-CM | POA: Diagnosis not present

## 2023-01-19 NOTE — Therapy (Signed)
OUTPATIENT PHYSICAL THERAPY NEURO TREATMENT Patient Name: Joseph Arroyo MRN: [id number] DOB:1940/09/12, 82 y.o., male Today's Date: 01/19/2023  END OF SESSION:  PT End of Session - 01/19/23 1408     Visit Number 22    Number of Visits 33    Date for PT Re-Evaluation 02/23/23    Authorization Type [employer]    Progress Note Due on Visit 10    PT Start Time 1316    PT Stop Time 1400    PT Time Calculation (min) 44 min    Equipment Utilized During Treatment Gait belt    Activity Tolerance Patient tolerated treatment well;Patient limited by pain    Behavior During Therapy WFL for tasks assessed/performed                      Past Medical History:  Diagnosis Date   Allergic rhinitis due to pollen 11/21/2007   Brachial neuritis or radiculitis NOS    Cervicalgia    Concussion    age 35 - s/p accident   Costal chondritis    Depression    Essential and other specified forms of tremor    GERD (gastroesophageal reflux disease)    in past   Lumbago    Occlusion and stenosis of carotid artery without mention of cerebral infarction    Seizures (HCC)    age 72 - after concussion   Past Surgical History:  Procedure Laterality Date   CATARACT EXTRACTION Right 07/18/14   CATARACT EXTRACTION W/PHACO Left 08/01/2014   Procedure: CATARACT EXTRACTION PHACO AND INTRAOCULAR LENS PLACEMENT (IOC);  Surgeon: Lockie Mola, MD;  Location: [facility];  Service: Ophthalmology;  Laterality: Left;  IVA TOPICAL   COLONOSCOPY  2008   OTHER SURGICAL HISTORY  2002   ear surgery   STAPEDES SURGERY Right 2002   Patient Active Problem List   Diagnosis Date Noted   Dizziness 10/02/2022   Lumbar facet joint pain 06/25/2022   Osteoarthritis of facet joint of lumbar spine 06/25/2022   Lumbosacral facet joint hypertrophy 06/25/2022   Parkinson's disease with dyskinesia (HCC) 01/27/2022   Dysphagia, pharyngeal phase 09/17/2021   Cervicalgia 08/05/2021   Spondylosis  without myelopathy or radiculopathy, lumbosacral region 06/24/2021   Lumbar central spinal stenosis, w/o neurogenic claudication (L4-5) 06/03/2021   Lumbar lateral recess stenosis (Bilateral: L2-3, L4-5) (Right: L3-4) 06/03/2021   Lumbosacral foraminal stenosis (Bilateral: L2-3, L3-4) (Left: L5-S1) 06/03/2021   Lumbar nerve root impingement (Right: L3 at L2-3 & L3-4) 06/03/2021   Ligamentum flavum hypertrophy (L3-4, L4-5) 06/03/2021   Abnormal MRI, lumbar spine (04/23/2021) 04/24/2021   Long term prescription benzodiazepine use (alprazolam) (Xanax) 03/24/2021   Grade 1 Retrolisthesis of L2/L3 (5 mm) and L3/L4 (3 mm) 03/24/2021   Levoscoliosis of lumbar spine (L3-4 apex) 03/24/2021   DDD (degenerative disc disease), lumbosacral 03/24/2021   Lumbosacral facet arthropathy (Left: L3-4, L4-5, and L5-S1) 03/24/2021   Tricompartment osteoarthritis of knee (Left) 03/24/2021   Baker cyst (Left) 03/24/2021   Chronic low back pain (1ry area of Pain) (Bilateral) (R>L) w/o sciatica 03/24/2021   Lumbar facet syndrome (Bilateral) 03/24/2021   Chronic lower extremity pain (2ry area of Pain) (Bilateral) (L>R) 03/24/2021   Lumbosacral radiculitis/sensory radiculopathy at L2 (Bilateral) 03/24/2021   Lumbosacral radiculitis/sensory radiculopathy at L3 (Bilateral) 03/24/2021   Chronic pain syndrome 03/23/2021   Pharmacologic therapy 03/23/2021   Disorder of skeletal system 03/23/2021   Problems influencing health status 03/23/2021   PAD (peripheral artery disease) (HCC) 10/01/2020  GAD (generalized anxiety disorder) 01/13/2018   MDD (major depressive disorder) 01/13/2018   Umbilical hernia 09/04/2017   Chronic knee pain (Left) 08/11/2017   Derangement of medial meniscus, posterior horn (Left) 08/11/2017   Vitamin D insufficiency 03/05/2016   BPH without obstruction/lower urinary tract symptoms 07/08/2015   Chronic fatigue 10/23/2014   Major depression, recurrent, full remission (HCC) 07/26/2013    Unsteady gait 07/26/2013   Orthostatic hypotension 07/26/2013   Depression 07/26/2013   Anxiety 06/23/2013   Chronic anxiety 06/23/2013   Tremor 05/18/2013   Bilateral carotid artery stenosis 08/30/2012    PCP: Smitty Cords, DO  REFERRING PROVIDER:   Mecum, Oswaldo Conroy, PA-C    REFERRING DIAG: R42 (ICD-10-CM) - Dizziness   THERAPY DIAG:  Muscle weakness (generalized)  Other lack of coordination  Difficulty in walking, not elsewhere classified  Unsteadiness on feet  ONSET DATE: August 1st 2024  Rationale for Evaluation and Treatment: Rehabilitation  SUBJECTIVE:   SUBJECTIVE STATEMENT  Pt reports back pain is about a 4/10. Pt getting new walker. He says balance is about the same.   Pt accompanied by: significant other   PERTINENT HISTORY:  The pt is a pleasant 82 yo male referred to PT for dizziness. Pt known to clinic, previously seen for falls in the setting of PD.  Pt spouse present for eval and reports pt was seen by neurologist recently at [facility] following onset of dizziness about two-three weeks ago. He reports they think he has BPPV.  PT spouse reports he had blood work done at this visit which "didn't show anything" and was normal. Pt unsure if his symptoms come on when he is still at rest. He describes his symptoms as spinning. This can be triggered when he lies on his back and also when he sits up from supine. Spinning lasts for minutes. He reports no nausea.  Pt sometimes sees double when he looks to the side and this has been going on for weeks. Denies any one sided weakness or other changes. He has fallen 4 times in past 3 months. Pt spouse reports pt had a fall about a week prior to dizziness onset. Pt reports no neck pain, has hx of LBP. His spouse reports decline in his mobility since this started. Pt uses RW at baseline for ambulation. Pt now being seen primarily for PD impairments. Pt PMH significant for Parkinson's disease, concussion, cervicalgia,  depression, seizures, chronic L knee pain, chronic anxiety, DDD, PAD, chronic pain syndrome, chronic low back pain, please refer to chart for full, extensive, PMH. PAIN:  Are you having pain? No Back pain hurting prior, took 3 ibuprophen.   PRECAUTIONS: Fall   WEIGHT BEARING RESTRICTIONS: No  FALLS: Has patient fallen in last 6 months? Yes. Number of falls 4  LIVING ENVIRONMENT: via chart and confirmed by pt in session Lives with: lives with their spouse Lives in: House/apartment Stairs: 4 Has following equipment at home: Walker - 2 wheeled  PLOF:  needs assistance, pt with PD that affected his mobility/ADLs  PATIENT GOALS: get rid of dizziness  OBJECTIVE:     TREATMENT:  DATE: 01/19/23  Gait belt donned throughout for pt safety  The following interventions include TherEx and NMR:    Warm-up: LAQ  2x15 each LE Seated march 2x15 each LE  Seated LTL step over half-bolster 15x each LE Seated PF/DF off half-bolster 20x. Rates DF as medium. Repeats DF for second set of 20 reps. STS 2x5 hands-free - review/extensive cuing for technique  Ambulation around the gym with RW for strength/endurance x 444 ft. Rates hard  Obstacle course: navigating through and around cones and stepping over 2 obstacles - cuing for proximity to RW throughout, particularly with turning  Standing balance at RW NBOS EO x30 sec CGA Standing balance at RW NBOS EC x 30 sec CGA     PATIENT EDUCATION: Education details: exercise technique  HOME EXERCISE PROGRAM: Updated: Access Code: [email or web address] URL: [email or web address] Date: 01/12/2023 Prepared by: Temple Pacini  Exercises - Seated Shoulder Flexion Full Range Single Arm  - 1 x daily - 7 x weekly - 2 sets - 10 reps  Access Code: [email or web address] URL: [email or web address] Date: 12/08/2022 Prepared by: Temple Pacini  Exercises -  Seated March  - 1 x daily - 5-6 x weekly - 4 sets - 10 reps - Seated Long Arc Quad  - 1 x daily - 5-6 x weekly - 2 sets - 15 reps - Seated Heel Toe Raises  - 1 x daily - 5-6 x weekly - 2 sets - 20 reps   Access Code: [email or web address] URL: [email or web address] Date: 12/24/2022 Prepared by: Tomasa Hose  Exercises - Standing Lumbar Extension at Wall - Forearms  - 1 x daily - 7 x weekly - 3 sets - 10 reps - Prone Press Up On Elbows  - 1 x daily - 7 x weekly - 3 sets - 10 reps - Seated Lumbar Extension  - 1 x daily - 7 x weekly - 3 sets - 10 reps  GOALS:   Goals reviewed with patient? Yes   SHORT TERM GOALS: Target date: 03/02/2023   Patient will be independent in home exercise program to improve dizziness, balance and mobility for better safety and functional independence with ADLs. Baseline: to be initiated future visit/as needed 11/12:  Goal status: IN PROGRESS    LONG TERM GOALS: Target date: 04/13/2023    Patient will increase FOTO score to equal to or greater than 53    to demonstrate statistically significant improvement in mobility and quality of life.  Baseline: 44; 01/12/23: 52  Goal status: PARTIALLY MET   2.  Patient will reduce dizziness handicap inventory score to <50, for less dizziness with ADLs and increased safety with home and work tasks.  Baseline: deferred, will complete as pt able: 10/10: 48, still has difficulty with bed mobility  Goal status: MET  3.  The pt will report at least a 60% decrease in positional or movement triggered dizziness symptoms in order to indicate decreased fall risk and increased balance/safety with mobility. Baseline: pt presents with decreased mobility due to dizziness, dizziness/vertigo triggered by particular positional; changes; 10/10: 60-70%  Goal status: MET  4.  Patient will reduce timed up and go to <11 seconds to reduce fall risk and demonstrate improved transfer/gait ability. Baseline: 9/5: 18 sec; 10/10: 16 seconds  seconds; 11/12: 17.5 sec with RW  Goal status: IN PROGRESS   5.Patient (> 12 years old) will complete five times sit to stand test in < 15 seconds indicating an increased LE strength and improved balance. Baseline: 12/01/22: 26 sec; 11/12: 22.5 sec  hands-free  Goal status: IN PROGRESS   6. Patient will increase 10 meter walk test to >1.6m/s as to improve gait speed for better community ambulation and to reduce fall risk. Baseline: 12/01/22: 0.6 m/s with RW; 11/12:  0.95 m/s with RW  Goal status: PARTIALLY MET   ASSESSMENT:  CLINICAL IMPRESSION: Pt slightly more pain-limited today. Interventions modified to be completed without ankle-weights. Extensive cuing also provided this visit to correct STS technique, emphasis on "nose over toes" to reduce straining movement to complete STS. Pt will continue to benefit from skilled therapy to address remaining deficits in order to improve overall QoL and return to PLOF.      OBJECTIVE IMPAIRMENTS: Abnormal gait, decreased activity tolerance, decreased balance, decreased coordination, decreased mobility, difficulty walking, decreased strength, dizziness, impaired vision/preception, improper body mechanics, and postural dysfunction.   ACTIVITY LIMITATIONS: lifting, bending, squatting, stairs, transfers, bed mobility, toileting, dressing, hygiene/grooming, and locomotion level  PARTICIPATION LIMITATIONS: meal prep, cleaning, laundry, shopping, community activity, and yard work  PERSONAL FACTORS: Age, Fitness, and 3+ comorbidities: Pt PMH significant for Parkinson's disease, concussion, cervicalgia, depression, seizures, chronic L knee pain, chronic anxiety, DDD, PAD, chronic pain syndrome, chronic low back pain, please refer to chart for full, extensive, PMH.  are also affecting patient's functional outcome.   REHAB POTENTIAL: Good  CLINICAL DECISION MAKING: Evolving/moderate complexity  EVALUATION COMPLEXITY: Moderate   PLAN:  PT FREQUENCY:  1-2x/week  PT DURATION: 12 weeks  PLANNED INTERVENTIONS: Therapeutic exercises, Therapeutic activity, Neuromuscular re-education, Balance training, Gait training, Patient/Family education, Self Care, Joint mobilization, Stair training, Vestibular training, Canalith repositioning, Visual/preceptual remediation/compensation, DME instructions, Electrical stimulation, Wheelchair mobility training, Spinal mobilization, Cryotherapy, Moist heat, Splintting, Taping, Traction, Manual therapy, and Re-evaluation  PLAN FOR NEXT SESSION: attempt standing/supported PD interventions, balance    Temple Pacini PT, DPT  Physical Therapist - [facility]  [facility]  01/19/23, 2:11 PM

## 2023-01-19 NOTE — Therapy (Signed)
OUTPATIENT OCCUPATIONAL THERAPY NEURO TREATMENT NOTE  Patient Name: Joseph Arroyo MRN: [id number] DOB:07/07/1940, 82 y.o., male Today's Date: 01/19/2023  PCP: Dr. Saralyn Arroyo REFERRING PROVIDER: PCP  END OF SESSION:  OT End of Session - 01/19/23 1500     Visit Number 11    Number of Visits 24    Date for OT Re-Evaluation 03/04/23    Progress Note Due on Visit 10    OT Start Time 1408    OT Stop Time 1449    OT Time Calculation (min) 41 min    Equipment Utilized During Treatment RW    Activity Tolerance Patient tolerated treatment well    Behavior During Therapy [facility] for tasks assessed/performed            Past Medical History:  Diagnosis Date   Allergic rhinitis due to pollen 11/21/2007   Brachial neuritis or radiculitis NOS    Cervicalgia    Concussion    age 29 - s/p accident   Costal chondritis    Depression    Essential and other specified forms of tremor    GERD (gastroesophageal reflux disease)    in past   Lumbago    Occlusion and stenosis of carotid artery without mention of cerebral infarction    Seizures [facility])    age 66 - after concussion   Past Surgical History:  Procedure Laterality Date   CATARACT EXTRACTION Right 07/18/14   CATARACT EXTRACTION W/PHACO Left 08/01/2014   Procedure: CATARACT EXTRACTION PHACO AND INTRAOCULAR LENS PLACEMENT (IOC);  Surgeon: Lockie Mola, MD;  Location: [facility];  Service: Ophthalmology;  Laterality: Left;  IVA TOPICAL   COLONOSCOPY  2008   OTHER SURGICAL HISTORY  2002   ear surgery   STAPEDES SURGERY Right 2002   Patient Active Problem List   Diagnosis Date Noted   Dizziness 10/02/2022   Lumbar facet joint pain 06/25/2022   Osteoarthritis of facet joint of lumbar spine 06/25/2022   Lumbosacral facet joint hypertrophy 06/25/2022   Parkinson's disease with dyskinesia (HCC) 01/27/2022   Dysphagia, pharyngeal phase 09/17/2021   Cervicalgia 08/05/2021   Spondylosis without myelopathy  or radiculopathy, lumbosacral region 06/24/2021   Lumbar central spinal stenosis, w/o neurogenic claudication (L4-5) 06/03/2021   Lumbar lateral recess stenosis (Bilateral: L2-3, L4-5) (Right: L3-4) 06/03/2021   Lumbosacral foraminal stenosis (Bilateral: L2-3, L3-4) (Left: L5-S1) 06/03/2021   Lumbar nerve root impingement (Right: L3 at L2-3 & L3-4) 06/03/2021   Ligamentum flavum hypertrophy (L3-4, L4-5) 06/03/2021   Abnormal MRI, lumbar spine (04/23/2021) 04/24/2021   Long term prescription benzodiazepine use (alprazolam) (Xanax) 03/24/2021   Grade 1 Retrolisthesis of L2/L3 (5 mm) and L3/L4 (3 mm) 03/24/2021   Levoscoliosis of lumbar spine (L3-4 apex) 03/24/2021   DDD (degenerative disc disease), lumbosacral 03/24/2021   Lumbosacral facet arthropathy (Left: L3-4, L4-5, and L5-S1) 03/24/2021   Tricompartment osteoarthritis of knee (Left) 03/24/2021   Baker cyst (Left) 03/24/2021   Chronic low back pain (1ry area of Pain) (Bilateral) (R>L) w/o sciatica 03/24/2021   Lumbar facet syndrome (Bilateral) 03/24/2021   Chronic lower extremity pain (2ry area of Pain) (Bilateral) (L>R) 03/24/2021   Lumbosacral radiculitis/sensory radiculopathy at L2 (Bilateral) 03/24/2021   Lumbosacral radiculitis/sensory radiculopathy at L3 (Bilateral) 03/24/2021   Chronic pain syndrome 03/23/2021   Pharmacologic therapy 03/23/2021   Disorder of skeletal system 03/23/2021   Problems influencing health status 03/23/2021   PAD (peripheral artery disease) (HCC) 10/01/2020   GAD (generalized anxiety disorder) 01/13/2018   MDD (major depressive disorder)  01/13/2018   Umbilical hernia 09/04/2017   Chronic knee pain (Left) 08/11/2017   Derangement of medial meniscus, posterior horn (Left) 08/11/2017   Vitamin D insufficiency 03/05/2016   BPH without obstruction/lower urinary tract symptoms 07/08/2015   Chronic fatigue 10/23/2014   Major depression, recurrent, full remission (HCC) 07/26/2013   Unsteady gait 07/26/2013    Orthostatic hypotension 07/26/2013   Depression 07/26/2013   Anxiety 06/23/2013   Chronic anxiety 06/23/2013   Tremor 05/18/2013   Bilateral carotid artery stenosis 08/30/2012   ONSET DATE: 2015  REFERRING DIAG: Parkinson's Disease  THERAPY DIAG:  Muscle weakness (generalized)  Other lack of coordination  Atypical parkinsonism (HCC)  Rationale for Evaluation and Treatment: Rehabilitation  SUBJECTIVE:  SUBJECTIVE STATEMENT: Pt verbalized some difficulties with specific self care skills this date; addressed below in tx note (grooming, dressing, eating). Pt accompanied by: significant other, spouse, Joseph Arroyo  PERTINENT HISTORY: Hx of Parkinson's disease (HCC); Chronic knee pain (Left); Derangement of medial meniscus, posterior horn (Left); PAD (peripheral artery disease) (HCC); Chronic pain syndrome; DDD (degenerative disc disease) lumbosacral; osteoarthritis of knee (Left); Chronic low back pain, Lumbar facet syndrome (Bilateral); Chronic lower extremity pain.   PRECAUTIONS: Fall  WEIGHT BEARING RESTRICTIONS: No  PAIN: 01/19/23: 4/10 pain low back Are you having pain? Yes: NPRS scale: 8-9/10 Pain location: low back  Pain description: aching, sometimes sharp  Aggravating factors: activity  Relieving factors: lying down, heat, ice pack  FALLS: Has patient fallen in last 6 months? Yes. Number of falls 4  LIVING ENVIRONMENT: Lives with: lives with their spouse Lives in: 1 level home Stairs: 4 steps, a landing, then 3 more steps with bilat rails.  Has following equipment at home: Dan Humphreys - 2 wheeled, Shower bench, bed side commode, and Grab bars   PLOF: Needs assistance with ADLs.  Pt performs basic self care with set up assist, extra time to manage clothing fasteners, occasional min A on days with worsening Parkinson's symptoms or back pain.    PATIENT GOALS: Pt wants to regain some strength and coordination skills in bilat hands.   OBJECTIVE:  Note: Objective measures were  completed at Evaluation unless otherwise noted.  HAND DOMINANCE: Right  ADLs: Overall ADLs: Extensive time required; occasional min A from spouse Transfers/ambulation related to ADLs: supv-modified indep with RW Eating: difficulty cutting food, struggles with loading food on a fork (uses built up foam handles at times); spouse cuts steak Grooming: electric toothbrush and standard toothbrush, but mostly uses electric; increased difficulty with brushing teeth and shaving (has electric trimmer and regular razor) UB Dressing: magnetic buttons on some shirts; more difficult to button shirts, occasional help with donning a shirt (min A)  LB Dressing: difficulty tying shoe laces, increased time; uses long shoe horn, has a hip kit; occasional assist to don socks  Toileting: modified indep/extra time  Bathing: able to take a shower with supv with tub bench Tub Shower transfers: transfer tub bench Equipment:  Pt does have hip kit and he regularly uses the long shoe horn.  Spouse reports pt may need some review with the other tools.   IADLs: Shopping: Spouse manages Light housekeeping: Spouse manages Meal Prep: Spouse manages Community mobility: Uses RW and requires frequent sitting breaks d/t back pain Medication management: modified Engineer, materials: Extra time for writing checks, with occasional assist from spouse on days when tremors are worse Handwriting: 90% legible, though pt reports this is an exceptional day today, and pt reports he's recently had to rip of checks  and have spouse take over d/t poor legibility.  MOBILITY STATUS: Hx of falls; spouse reports pt to have had 4 falls in the last 6 mo  POSTURE COMMENTS:  rounded shoulders, increased thoracic kyphosis, and hx of scoliosis , L shoulder rests significantly lower than R Sitting balance: Supports self with one extremity  ACTIVITY TOLERANCE: Activity tolerance: limited by low back pain   FUNCTIONAL OUTCOME  MEASURES: FOTO: TBD  (PT managing FOTO)  UPPER EXTREMITY ROM:  BUEs WFL  UPPER EXTREMITY MMT:     MMT Right eval Left eval  Shoulder flexion 5 5  Shoulder abduction 5 5  Shoulder adduction    Shoulder extension    Shoulder internal rotation 4 4  Shoulder external rotation 4 4  Middle trapezius    Lower trapezius    Elbow flexion    Elbow extension    Wrist flexion    Wrist extension    Wrist ulnar deviation    Wrist radial deviation    Wrist pronation    Wrist supination    (Blank rows = not tested)  HAND FUNCTION: Eval: Grip strength: Right: 30 lbs; Left: 45 lbs, Lateral pinch: Right: 12 lbs, Left: 5 lbs, and 3 point pinch: Right: 10 lbs, Left: 6 lbs 01/14/23: Grip strength: Right: 38 lbs; Left: 39 lbs, Lateral pinch: Right: 11 lbs, Left: 5 lbs, and 3 point pinch: Right: 9 lbs, Left: 5 lbs  COORDINATION: Eval: 9 Hole Peg test: Right: 33 sec; Left: 41 sec 01/14/23: 9 Hole Peg test: Right: 28 sec; Left: 29 sec  SENSATION: Not tested  EDEMA: No visible edema  MUSCLE TONE: RUE: Within functional limits and LUE: Within functional limits  COGNITION: Overall cognitive status: Within functional limits for tasks assessed; spouse reports some impulsivity/poor safety awareness contributing to falls at home  VISION: No recent changes; wears glasses for reading Visual history: macular degeneration, hx of cataract removal both eyes  PERCEPTION: Not tested  PRAXIS: Impaired: Motor planning  TODAY'S TREATMENT:                                                                                                                              DATE: 01/19/23 Moist heat applied to low back with support of lumbar pillow; a second pillow wedged between R side and arm rest of chair for midline position and back pain management throughout session.  Self Care: ADL focus this date: -Grooming: Pt verbalized difficulty rotating electric toothbrush in hand, and difficulty standing to manage  this task d/t back pain.  Spouse verbalized no room for chair in bathroom.  Recommendations made for using basin and organize items for brushing teeth within basin, including cup for water, rinsing, spitting, small vanity mirror, to allow pt to brush teeth sitting down in bedroom chair or kitchen table.   -UB dressing: Reviewed/demonstrated use of dressing stick.  Pt practiced donning his magnetic button up shirt in sitting and standing, with and without dressing  stick, and an alternate strategy threading both hands through shirt sleeves with shirt in lap, then lifting arms overhead to fully don shirt.  Ultimately recommending pt dressing while seated to reduce fall risk and to practice strategies reviewed today to identify a preferred one.   -Eating: Pt verbalizes difficulty scooping food.  Reviewed benefits of scoop plates and divided plates to ease ability to scoop food.    Spouse verbalized plan to have a male support person to sit with pt to allow spouse opportunities to leave the house for >2 hours.  Encouraged pt and spouse make a list to identify ADL tasks that pt would feel comfortable receiving assist with.  Both receptive.  PATIENT EDUCATION: Education details: ADL compensatory strategies Person educated: Patient and Spouse Education method: Explanation, demo Education comprehension: verbalized understanding, demonstrated understanding with vc and tactile cues  HOME EXERCISE PROGRAM: Pt instructed to use his theraputty or his hand strengthener (spring based), [facility] exercises (handout issued), red theraband for bilat shoulder strengthening (ER/IR)  GOALS: Goals reviewed with patient? Yes  SHORT TERM GOALS: Target date: 01/21/23  Pt will perform HEP with supv for increasing strength and coordination in B hands.  Baseline: Eval: Will review previous HEP and add to as appropriate; 01/14/23: Pt using theraputty; added theraband today to target ER/IR strengthening at bilat shoulders Goal  status: in progress   2.  Pt will implement fall prevention strategies with min vc from spouse at least 50% of the time.  Baseline: Spouse reports pt with 4 falls over the last 6 months, noting decreased safety awareness/impulsivity; 01/14/23: spouse reports inconsistent use of walker at times and pt walks in socks without grips.  Goal status: in progress  LONG TERM GOALS: Target date: 03/04/2023  Pt will increase FOTO score by (TBD) to indicate improvement in perceived functional performance with daily tasks. Baseline: Eval: FOTO to be given next session; 01/14/23: OT FOTO same as PT (PT managing) Goal status: in progress  2.  Pt will utilize AE/activity modification to participate in face time calls with family [place] with set up/supv. Baseline: Eval: Pt reports he is struggling to accurately press buttons on his cell phone, often having spouse assist to manage a call (Initial recommendation at eval to transition to tablet for Face time calls to work with larger screen surface; spouse receptive); 03/15/22: No consistent changes implemented yet Goal status: in progress  3.  Pt will use adapted writing aids/utilize positioning strategies/activity modifications to enable pt to take simple notes when reading scripture and to write checks legibly. Baseline: Eval: Pt reports he has not been able to take notes in awhile d/t poor legibility and this task requiring increased effort and fatigue; 01/14/23: Pt tries to write checks as needed in the evening (more energy this time of day); variety of adapted pens have been tried; legibility inconsistent Goal status: in progress  4.  Pt will increase bilat grip strength by 5 or more lbs to improve ability to open containers.  Baseline: Eval: R grip: 30 lbs, L grip: 45 lbs; 01/14/23: R 38 lbs, L 39 lbs Goal status: in progress  5.  Pt will improve [facility] skills to increase efficiency with manipulation of eating and grooming utensils.   Baseline: Eval: R 33  sec, L 41 sec; 01/14/23: R 28 sec; L 29 sec Goal status: in progress  ASSESSMENT:  CLINICAL IMPRESSION: Focus this date on activity modification/AE/compensatory strategies to maximize indep and safety with ADLs.  Pt was asked to verbalize 2  strategies that he would be receptive to trying this week before next OT session.  Pt verbalized that he would plan to try brushing teeth while seated at kitchen table and practice donning his shirt while seated.  Pt continues to be very limited by low back pain, fatigue, and tremors, noting certain days are more difficult than others, as expected.  Pt shows some reluctance with compensatory strategies at times, and seems to verbalize defeat when he admits that there are times when spouse has to help with various ADLs, simply to complete the task in a hurry d/t severity of pt's back pain.  OT reassured pt that accepting help to improve his comfort is beneficial, and encouraged pt to work on his independence with daily tasks on his "good days," or during the hours of the day when pain is less.  Pt continues to benefit from skilled OT to review/progress HEP, review/advise on activity modifications/AE, provide exercises and activities to promote hand strength and coordination skills, and reinforce fall prevention strategies to maximize safety, efficiency, and independence with daily tasks.  Pt./spouse continue to be in agreement with poc.   PERFORMANCE DEFICITS: in functional skills including ADLs, IADLs, coordination, dexterity, strength, pain, Fine motor control, Gross motor control, mobility, balance, body mechanics, endurance, decreased knowledge of use of DME, and UE functional use, cognitive skills including memory and safety awareness, and psychosocial skills including coping strategies, environmental adaptation, habits, and routines and behaviors.   IMPAIRMENTS: are limiting patient from ADLs, IADLs, leisure, and social participation.   CO-MORBIDITIES: has  co-morbidities such as chronic pain syndrome, spinal stenosis, DDD, dizziness   that affects occupational performance. Patient will benefit from skilled OT to address above impairments and improve overall function.  MODIFICATION OR ASSISTANCE TO COMPLETE EVALUATION: No modification of tasks or assist necessary to complete an evaluation.  OT OCCUPATIONAL PROFILE AND HISTORY: Problem focused assessment: Including review of records relating to presenting problem.  CLINICAL DECISION MAKING: Moderate - several treatment options, min-mod task modification necessary  REHAB POTENTIAL: Good  EVALUATION COMPLEXITY: Low    PLAN:  OT FREQUENCY: 2x/week  OT DURATION: 12 weeks  PLANNED INTERVENTIONS: 97535 self care/ADL training, 16109 therapeutic exercise, 97530 therapeutic activity, 97112 neuromuscular re-education, 97010 moist heat, 97010 cryotherapy, passive range of motion, balance training, functional mobility training, visual/perceptual remediation/compensation, psychosocial skills training, energy conservation, coping strategies training, patient/family education, and DME and/or AE instructions  RECOMMENDED OTHER SERVICES: None at this time  CONSULTED AND AGREED WITH PLAN OF CARE: family member/caregiver  PLAN FOR NEXT SESSION:   Danelle Earthly, MS, OTR/L 01/19/2023, 3:02 PM

## 2023-01-21 ENCOUNTER — Ambulatory Visit: Payer: Medicare HMO

## 2023-01-21 DIAGNOSIS — R278 Other lack of coordination: Secondary | ICD-10-CM

## 2023-01-21 DIAGNOSIS — M5459 Other low back pain: Secondary | ICD-10-CM

## 2023-01-21 DIAGNOSIS — M6281 Muscle weakness (generalized): Secondary | ICD-10-CM

## 2023-01-21 DIAGNOSIS — G20C Parkinsonism, unspecified: Secondary | ICD-10-CM

## 2023-01-21 DIAGNOSIS — R262 Difficulty in walking, not elsewhere classified: Secondary | ICD-10-CM

## 2023-01-21 DIAGNOSIS — R2681 Unsteadiness on feet: Secondary | ICD-10-CM | POA: Diagnosis not present

## 2023-01-21 NOTE — Therapy (Signed)
OUTPATIENT PHYSICAL THERAPY NEURO TREATMENT Patient Name: Joseph Arroyo MRN: [id number] DOB:1940/12/29, 82 y.o., male Today's Date: 01/21/2023  END OF SESSION:  PT End of Session - 01/21/23 1715     Visit Number 23    Number of Visits 33    Date for PT Re-Evaluation 02/23/23    Authorization Type [employer]    Progress Note Due on Visit 10    PT Start Time 1316    PT Stop Time 1359    PT Time Calculation (min) 43 min    Equipment Utilized During Treatment Gait belt    Activity Tolerance Patient tolerated treatment well;Patient limited by pain    Behavior During Therapy WFL for tasks assessed/performed                       Past Medical History:  Diagnosis Date   Allergic rhinitis due to pollen 11/21/2007   Brachial neuritis or radiculitis NOS    Cervicalgia    Concussion    age 70 - s/p accident   Costal chondritis    Depression    Essential and other specified forms of tremor    GERD (gastroesophageal reflux disease)    in past   Lumbago    Occlusion and stenosis of carotid artery without mention of cerebral infarction    Seizures (HCC)    age 59 - after concussion   Past Surgical History:  Procedure Laterality Date   CATARACT EXTRACTION Right 07/18/14   CATARACT EXTRACTION W/PHACO Left 08/01/2014   Procedure: CATARACT EXTRACTION PHACO AND INTRAOCULAR LENS PLACEMENT (IOC);  Surgeon: Lockie Mola, MD;  Location: [facility];  Service: Ophthalmology;  Laterality: Left;  IVA TOPICAL   COLONOSCOPY  2008   OTHER SURGICAL HISTORY  2002   ear surgery   STAPEDES SURGERY Right 2002   Patient Active Problem List   Diagnosis Date Noted   Dizziness 10/02/2022   Lumbar facet joint pain 06/25/2022   Osteoarthritis of facet joint of lumbar spine 06/25/2022   Lumbosacral facet joint hypertrophy 06/25/2022   Parkinson's disease with dyskinesia (HCC) 01/27/2022   Dysphagia, pharyngeal phase 09/17/2021   Cervicalgia 08/05/2021    Spondylosis without myelopathy or radiculopathy, lumbosacral region 06/24/2021   Lumbar central spinal stenosis, w/o neurogenic claudication (L4-5) 06/03/2021   Lumbar lateral recess stenosis (Bilateral: L2-3, L4-5) (Right: L3-4) 06/03/2021   Lumbosacral foraminal stenosis (Bilateral: L2-3, L3-4) (Left: L5-S1) 06/03/2021   Lumbar nerve root impingement (Right: L3 at L2-3 & L3-4) 06/03/2021   Ligamentum flavum hypertrophy (L3-4, L4-5) 06/03/2021   Abnormal MRI, lumbar spine (04/23/2021) 04/24/2021   Long term prescription benzodiazepine use (alprazolam) (Xanax) 03/24/2021   Grade 1 Retrolisthesis of L2/L3 (5 mm) and L3/L4 (3 mm) 03/24/2021   Levoscoliosis of lumbar spine (L3-4 apex) 03/24/2021   DDD (degenerative disc disease), lumbosacral 03/24/2021   Lumbosacral facet arthropathy (Left: L3-4, L4-5, and L5-S1) 03/24/2021   Tricompartment osteoarthritis of knee (Left) 03/24/2021   Baker cyst (Left) 03/24/2021   Chronic low back pain (1ry area of Pain) (Bilateral) (R>L) w/o sciatica 03/24/2021   Lumbar facet syndrome (Bilateral) 03/24/2021   Chronic lower extremity pain (2ry area of Pain) (Bilateral) (L>R) 03/24/2021   Lumbosacral radiculitis/sensory radiculopathy at L2 (Bilateral) 03/24/2021   Lumbosacral radiculitis/sensory radiculopathy at L3 (Bilateral) 03/24/2021   Chronic pain syndrome 03/23/2021   Pharmacologic therapy 03/23/2021   Disorder of skeletal system 03/23/2021   Problems influencing health status 03/23/2021   PAD (peripheral artery disease) (HCC) 10/01/2020  GAD (generalized anxiety disorder) 01/13/2018   MDD (major depressive disorder) 01/13/2018   Umbilical hernia 09/04/2017   Chronic knee pain (Left) 08/11/2017   Derangement of medial meniscus, posterior horn (Left) 08/11/2017   Vitamin D insufficiency 03/05/2016   BPH without obstruction/lower urinary tract symptoms 07/08/2015   Chronic fatigue 10/23/2014   Major depression, recurrent, full remission (HCC)  07/26/2013   Unsteady gait 07/26/2013   Orthostatic hypotension 07/26/2013   Depression 07/26/2013   Anxiety 06/23/2013   Chronic anxiety 06/23/2013   Tremor 05/18/2013   Bilateral carotid artery stenosis 08/30/2012    PCP: Smitty Cords, DO  REFERRING PROVIDER:   Mecum, Oswaldo Conroy, PA-C    REFERRING DIAG: R42 (ICD-10-CM) - Dizziness   THERAPY DIAG:  Difficulty in walking, not elsewhere classified  Muscle weakness (generalized)  Other lack of coordination  Unsteadiness on feet  Other low back pain  ONSET DATE: August 1st 2024  Rationale for Evaluation and Treatment: Rehabilitation  SUBJECTIVE:   SUBJECTIVE STATEMENT  Pt reports back pain is about a 3/10. Pt presents with new RW.  Pt accompanied by: significant other   PERTINENT HISTORY:  The pt is a pleasant 82 yo male referred to PT for dizziness. Pt known to clinic, previously seen for falls in the setting of PD.  Pt spouse present for eval and reports pt was seen by neurologist recently at [facility] following onset of dizziness about two-three weeks ago. He reports they think he has BPPV.  PT spouse reports he had blood work done at this visit which "didn't show anything" and was normal. Pt unsure if his symptoms come on when he is still at rest. He describes his symptoms as spinning. This can be triggered when he lies on his back and also when he sits up from supine. Spinning lasts for minutes. He reports no nausea.  Pt sometimes sees double when he looks to the side and this has been going on for weeks. Denies any one sided weakness or other changes. He has fallen 4 times in past 3 months. Pt spouse reports pt had a fall about a week prior to dizziness onset. Pt reports no neck pain, has hx of LBP. His spouse reports decline in his mobility since this started. Pt uses RW at baseline for ambulation. Pt now being seen primarily for PD impairments. Pt PMH significant for Parkinson's disease, concussion, cervicalgia,  depression, seizures, chronic L knee pain, chronic anxiety, DDD, PAD, chronic pain syndrome, chronic low back pain, please refer to chart for full, extensive, PMH. PAIN:  Are you having pain? No Back pain hurting prior, took 3 ibuprophen.   PRECAUTIONS: Fall   WEIGHT BEARING RESTRICTIONS: No  FALLS: Has patient fallen in last 6 months? Yes. Number of falls 4  LIVING ENVIRONMENT: via chart and confirmed by pt in session Lives with: lives with their spouse Lives in: House/apartment Stairs: 4 Has following equipment at home: Walker - 2 wheeled  PLOF:  needs assistance, pt with PD that affected his mobility/ADLs  PATIENT GOALS: get rid of dizziness  OBJECTIVE:     TREATMENT:  DATE: 01/21/23  Gait belt donned throughout for pt safety, heat applied to low back while pt performing seated interventions (reports heat feels good, no adverse reaction to heat)  The following interventions include TherEx and NMR:    Large amplitude movement training- Swing BUE into shoulder flexion/upward reach 2x10 bilat UE Seated large amplitude cross-body reach  2x10 each way - Pt rates easy + Ambulation around the gym with RW for strength/endurance x 444 ft wearing 2# aw Seated fwd reach>reach down>reach up>reach out 15x - pt rates medium Seated shoulder abduction twists emphasis on palm up 10x each side Finger flicks 20x bilat  UE Ambulation around the gym with RW for strength/endurance x 444 ft. Pt wearing 2# ankle weights Rates medium  STS 7x, 8x  - focus on technique, improve following cues  Obstacle course weaving around cones, stepping over 3 obstacles cuing for proxmity to RW and objects with turns x 4 rounds    PATIENT EDUCATION: Education details: exercise technique Person education: patient  HOME EXERCISE PROGRAM: Continue as previously given: Access Code: [email or web address] URL:  [email or web address] Date: 01/12/2023 Prepared by: Temple Pacini  Exercises - Seated Shoulder Flexion Full Range Single Arm  - 1 x daily - 7 x weekly - 2 sets - 10 reps  Access Code: [email or web address] URL: [email or web address] Date: 12/08/2022 Prepared by: Temple Pacini  Exercises - Seated March  - 1 x daily - 5-6 x weekly - 4 sets - 10 reps - Seated Long Arc Quad  - 1 x daily - 5-6 x weekly - 2 sets - 15 reps - Seated Heel Toe Raises  - 1 x daily - 5-6 x weekly - 2 sets - 20 reps   Access Code: [email or web address] URL: [email or web address] Date: 12/24/2022 Prepared by: Tomasa Hose  Exercises - Standing Lumbar Extension at Wall - Forearms  - 1 x daily - 7 x weekly - 3 sets - 10 reps - Prone Press Up On Elbows  - 1 x daily - 7 x weekly - 3 sets - 10 reps - Seated Lumbar Extension  - 1 x daily - 7 x weekly - 3 sets - 10 reps  GOALS:   Goals reviewed with patient? Yes   SHORT TERM GOALS: Target date: 03/04/2023   Patient will be independent in home exercise program to improve dizziness, balance and mobility for better safety and functional independence with ADLs. Baseline: to be initiated future visit/as needed 11/12:  Goal status: IN PROGRESS    LONG TERM GOALS: Target date: 04/15/2023    Patient will increase FOTO score to equal to or greater than 53    to demonstrate statistically significant improvement in mobility and quality of life.  Baseline: 44; 01/12/23: 52  Goal status: PARTIALLY MET   2.  Patient will reduce dizziness handicap inventory score to <50, for less dizziness with ADLs and increased safety with home and work tasks.  Baseline: deferred, will complete as pt able: 10/10: 48, still has difficulty with bed mobility  Goal status: MET  3.  The pt will report at least a 60% decrease in positional or movement triggered dizziness symptoms in order to indicate decreased fall risk and increased balance/safety with mobility. Baseline: pt  presents with decreased mobility due to dizziness, dizziness/vertigo triggered by particular positional; changes; 10/10: 60-70%  Goal status: MET  4.  Patient will reduce timed up and go to <11 seconds to reduce fall risk and demonstrate improved transfer/gait ability. Baseline: 9/5: 18 sec; 10/10: 16 seconds seconds; 11/12:  17.5 sec with RW  Goal status: IN PROGRESS   5.Patient (> 59 years old) will complete five times sit to stand test in < 15 seconds indicating an increased LE strength and improved balance. Baseline: 12/01/22: 26 sec; 11/12: 22.5 sec hands-free  Goal status: IN PROGRESS   6. Patient will increase 10 meter walk test to >1.49m/s as to improve gait speed for better community ambulation and to reduce fall risk. Baseline: 12/01/22: 0.6 m/s with RW; 11/12:  0.95 m/s with RW  Goal status: PARTIALLY MET   ASSESSMENT:  CLINICAL IMPRESSION: Pt shows carryover between sessions with improved proximity to RW with turns during obstacle course, requiring fewer cues. Pt also able to demo improved STS technique with increased anterior lean ("nose over toes"), increasing ease with this intervention. Pt will continue to benefit from skilled therapy to address remaining deficits in order to improve overall QoL and return to PLOF.      OBJECTIVE IMPAIRMENTS: Abnormal gait, decreased activity tolerance, decreased balance, decreased coordination, decreased mobility, difficulty walking, decreased strength, dizziness, impaired vision/preception, improper body mechanics, and postural dysfunction.   ACTIVITY LIMITATIONS: lifting, bending, squatting, stairs, transfers, bed mobility, toileting, dressing, hygiene/grooming, and locomotion level  PARTICIPATION LIMITATIONS: meal prep, cleaning, laundry, shopping, community activity, and yard work  PERSONAL FACTORS: Age, Fitness, and 3+ comorbidities: Pt PMH significant for Parkinson's disease, concussion, cervicalgia, depression, seizures, chronic L  knee pain, chronic anxiety, DDD, PAD, chronic pain syndrome, chronic low back pain, please refer to chart for full, extensive, PMH.  are also affecting patient's functional outcome.   REHAB POTENTIAL: Good  CLINICAL DECISION MAKING: Evolving/moderate complexity  EVALUATION COMPLEXITY: Moderate   PLAN:  PT FREQUENCY: 1-2x/week  PT DURATION: 12 weeks  PLANNED INTERVENTIONS: Therapeutic exercises, Therapeutic activity, Neuromuscular re-education, Balance training, Gait training, Patient/Family education, Self Care, Joint mobilization, Stair training, Vestibular training, Canalith repositioning, Visual/preceptual remediation/compensation, DME instructions, Electrical stimulation, Wheelchair mobility training, Spinal mobilization, Cryotherapy, Moist heat, Splintting, Taping, Traction, Manual therapy, and Re-evaluation  PLAN FOR NEXT SESSION: attempt standing/supported PD interventions, balance    Temple Pacini PT, DPT  Physical Therapist - [facility]  [facility]  01/21/23, 5:19 PM

## 2023-01-23 NOTE — Therapy (Signed)
OUTPATIENT OCCUPATIONAL THERAPY NEURO TREATMENT NOTE  Patient Name: Joseph Arroyo MRN: [id number] DOB:1940/07/04, 82 y.o., male Today's Date: 01/23/2023  PCP: Dr. Saralyn Pilar REFERRING PROVIDER: PCP  END OF SESSION:  OT End of Session - 01/23/23 0930     Visit Number 12    Number of Visits 24    Date for OT Re-Evaluation 03/04/23    Progress Note Due on Visit 10    OT Start Time 1400    OT Stop Time 1445    OT Time Calculation (min) 45 min    Equipment Utilized During Treatment RW    Activity Tolerance Patient tolerated treatment well    Behavior During Therapy [facility] for tasks assessed/performed            Past Medical History:  Diagnosis Date   Allergic rhinitis due to pollen 11/21/2007   Brachial neuritis or radiculitis NOS    Cervicalgia    Concussion    age 37 - s/p accident   Costal chondritis    Depression    Essential and other specified forms of tremor    GERD (gastroesophageal reflux disease)    in past   Lumbago    Occlusion and stenosis of carotid artery without mention of cerebral infarction    Seizures [facility]    age 48 - after concussion   Past Surgical History:  Procedure Laterality Date   CATARACT EXTRACTION Right 07/18/14   CATARACT EXTRACTION W/PHACO Left 08/01/2014   Procedure: CATARACT EXTRACTION PHACO AND INTRAOCULAR LENS PLACEMENT (IOC);  Surgeon: Lockie Mola, MD;  Location: [facility];  Service: Ophthalmology;  Laterality: Left;  IVA TOPICAL   COLONOSCOPY  2008   OTHER SURGICAL HISTORY  2002   ear surgery   STAPEDES SURGERY Right 2002   Patient Active Problem List   Diagnosis Date Noted   Dizziness 10/02/2022   Lumbar facet joint pain 06/25/2022   Osteoarthritis of facet joint of lumbar spine 06/25/2022   Lumbosacral facet joint hypertrophy 06/25/2022   Parkinson's disease with dyskinesia (HCC) 01/27/2022   Dysphagia, pharyngeal phase 09/17/2021   Cervicalgia 08/05/2021   Spondylosis without myelopathy  or radiculopathy, lumbosacral region 06/24/2021   Lumbar central spinal stenosis, w/o neurogenic claudication (L4-5) 06/03/2021   Lumbar lateral recess stenosis (Bilateral: L2-3, L4-5) (Right: L3-4) 06/03/2021   Lumbosacral foraminal stenosis (Bilateral: L2-3, L3-4) (Left: L5-S1) 06/03/2021   Lumbar nerve root impingement (Right: L3 at L2-3 & L3-4) 06/03/2021   Ligamentum flavum hypertrophy (L3-4, L4-5) 06/03/2021   Abnormal MRI, lumbar spine (04/23/2021) 04/24/2021   Long term prescription benzodiazepine use (alprazolam) (Xanax) 03/24/2021   Grade 1 Retrolisthesis of L2/L3 (5 mm) and L3/L4 (3 mm) 03/24/2021   Levoscoliosis of lumbar spine (L3-4 apex) 03/24/2021   DDD (degenerative disc disease), lumbosacral 03/24/2021   Lumbosacral facet arthropathy (Left: L3-4, L4-5, and L5-S1) 03/24/2021   Tricompartment osteoarthritis of knee (Left) 03/24/2021   Baker cyst (Left) 03/24/2021   Chronic low back pain (1ry area of Pain) (Bilateral) (R>L) w/o sciatica 03/24/2021   Lumbar facet syndrome (Bilateral) 03/24/2021   Chronic lower extremity pain (2ry area of Pain) (Bilateral) (L>R) 03/24/2021   Lumbosacral radiculitis/sensory radiculopathy at L2 (Bilateral) 03/24/2021   Lumbosacral radiculitis/sensory radiculopathy at L3 (Bilateral) 03/24/2021   Chronic pain syndrome 03/23/2021   Pharmacologic therapy 03/23/2021   Disorder of skeletal system 03/23/2021   Problems influencing health status 03/23/2021   PAD (peripheral artery disease) (HCC) 10/01/2020   GAD (generalized anxiety disorder) 01/13/2018   MDD (major depressive disorder)  01/13/2018   Umbilical hernia 09/04/2017   Chronic knee pain (Left) 08/11/2017   Derangement of medial meniscus, posterior horn (Left) 08/11/2017   Vitamin D insufficiency 03/05/2016   BPH without obstruction/lower urinary tract symptoms 07/08/2015   Chronic fatigue 10/23/2014   Major depression, recurrent, full remission (HCC) 07/26/2013   Unsteady gait 07/26/2013    Orthostatic hypotension 07/26/2013   Depression 07/26/2013   Anxiety 06/23/2013   Chronic anxiety 06/23/2013   Tremor 05/18/2013   Bilateral carotid artery stenosis 08/30/2012   ONSET DATE: 2015  REFERRING DIAG: Parkinson's Disease  THERAPY DIAG:  Muscle weakness (generalized)  Other lack of coordination  Atypical parkinsonism (HCC)  Rationale for Evaluation and Treatment: Rehabilitation  SUBJECTIVE:  SUBJECTIVE STATEMENT: Pt reported that he practiced shirt donning strategies reviewed in last OT session, and found sitting down to don his shirt and donning both hands in shirt sleeves then stretching arms overhead were both efficient strategies for him. Pt accompanied by: significant other, spouse, [name]  PERTINENT HISTORY: Hx of Parkinson's disease (HCC); Chronic knee pain (Left); Derangement of medial meniscus, posterior horn (Left); PAD (peripheral artery disease) (HCC); Chronic pain syndrome; DDD (degenerative disc disease) lumbosacral; osteoarthritis of knee (Left); Chronic low back pain, Lumbar facet syndrome (Bilateral); Chronic lower extremity pain.   PRECAUTIONS: Fall  WEIGHT BEARING RESTRICTIONS: No  PAIN: 01/21/23: 4/10 pain low back Are you having pain? Yes: NPRS scale: 8-9/10 Pain location: low back  Pain description: aching, sometimes sharp  Aggravating factors: activity  Relieving factors: lying down, heat, ice pack  FALLS: Has patient fallen in last 6 months? Yes. Number of falls 4  LIVING ENVIRONMENT: Lives with: lives with their spouse Lives in: 1 level home Stairs: 4 steps, a landing, then 3 more steps with bilat rails.  Has following equipment at home: Dan Humphreys - 2 wheeled, Shower bench, bed side commode, and Grab bars   PLOF: Needs assistance with ADLs.  Pt performs basic self care with set up assist, extra time to manage clothing fasteners, occasional min A on days with worsening Parkinson's symptoms or back pain.    PATIENT GOALS: Pt wants to  regain some strength and coordination skills in bilat hands.   OBJECTIVE:  Note: Objective measures were completed at Evaluation unless otherwise noted.  HAND DOMINANCE: Right  ADLs: Overall ADLs: Extensive time required; occasional min A from spouse Transfers/ambulation related to ADLs: supv-modified indep with RW Eating: difficulty cutting food, struggles with loading food on a fork (uses built up foam handles at times); spouse cuts steak Grooming: electric toothbrush and standard toothbrush, but mostly uses electric; increased difficulty with brushing teeth and shaving (has electric trimmer and regular razor) UB Dressing: magnetic buttons on some shirts; more difficult to button shirts, occasional help with donning a shirt (min A)  LB Dressing: difficulty tying shoe laces, increased time; uses long shoe horn, has a hip kit; occasional assist to don socks  Toileting: modified indep/extra time  Bathing: able to take a shower with supv with tub bench Tub Shower transfers: transfer tub bench Equipment:  Pt does have hip kit and he regularly uses the long shoe horn.  Spouse reports pt may need some review with the other tools.   IADLs: Shopping: Spouse manages Light housekeeping: Spouse manages Meal Prep: Spouse manages Community mobility: Uses RW and requires frequent sitting breaks d/t back pain Medication management: modified indep Financial management: Extra time for writing checks, with occasional assist from spouse on days when tremors are worse Handwriting: 90% legible,  though pt reports this is an exceptional day today, and pt reports he's recently had to rip of checks and have spouse take over d/t poor legibility.  MOBILITY STATUS: Hx of falls; spouse reports pt to have had 4 falls in the last 6 mo  POSTURE COMMENTS:  rounded shoulders, increased thoracic kyphosis, and hx of scoliosis , L shoulder rests significantly lower than R Sitting balance: Supports self with one  extremity  ACTIVITY TOLERANCE: Activity tolerance: limited by low back pain   FUNCTIONAL OUTCOME MEASURES: FOTO: TBD  (PT managing FOTO)  UPPER EXTREMITY ROM:  BUEs WFL  UPPER EXTREMITY MMT:     MMT Right eval Left eval  Shoulder flexion 5 5  Shoulder abduction 5 5  Shoulder adduction    Shoulder extension    Shoulder internal rotation 4 4  Shoulder external rotation 4 4  Middle trapezius    Lower trapezius    Elbow flexion    Elbow extension    Wrist flexion    Wrist extension    Wrist ulnar deviation    Wrist radial deviation    Wrist pronation    Wrist supination    (Blank rows = not tested)  HAND FUNCTION: Eval: Grip strength: Right: 30 lbs; Left: 45 lbs, Lateral pinch: Right: 12 lbs, Left: 5 lbs, and 3 point pinch: Right: 10 lbs, Left: 6 lbs 01/14/23: Grip strength: Right: 38 lbs; Left: 39 lbs, Lateral pinch: Right: 11 lbs, Left: 5 lbs, and 3 point pinch: Right: 9 lbs, Left: 5 lbs  COORDINATION: Eval: 9 Hole Peg test: Right: 33 sec; Left: 41 sec 01/14/23: 9 Hole Peg test: Right: 28 sec; Left: 29 sec  SENSATION: Not tested  EDEMA: No visible edema  MUSCLE TONE: RUE: Within functional limits and LUE: Within functional limits  COGNITION: Overall cognitive status: Within functional limits for tasks assessed; spouse reports some impulsivity/poor safety awareness contributing to falls at home  VISION: No recent changes; wears glasses for reading Visual history: macular degeneration, hx of cataract removal both eyes  PERCEPTION: Not tested  PRAXIS: Impaired: Motor planning  TODAY'S TREATMENT:                                                                                                                              DATE: 01/21/23 Moist heat applied to low back with support of lumbar pillow; a second pillow wedged between R side and arm rest of chair for midline position and back pain management throughout session.  Therapeutic Activity: Participation in  activities to promote FMC/dexterity skills in R/L hands: -Pt practiced rotating jumbo pegs 180* 1 at a time within fingertips with subsequent placement of peg into row below.  Intermittent tactile cues and visual demo to stabilize forearm on table top to isolate finger movements from forearm. -Worked with alphabet dice to target storing and translatory movements in R/L hands working with 4 dice in hand.  Pt practiced rotating 1 dice at a  time within fingertips while storing the other 3 in hand in order to construct 4 letter words with dice, discarding 1 by 1 on table top to construct 4 letter words. -Practiced sustained prehension with narrow tweezers to pick up small cubed beads from table top and drop into narrow  container.  Alternated between R/L hands.  PATIENT EDUCATION: Education details: FMC/dexterity skills Person educated: Patient and Spouse Education method: Explanation, demo Education comprehension: verbalized understanding, demonstrated understanding with vc and tactile cues  HOME EXERCISE PROGRAM: Pt instructed to use his theraputty or his hand strengthener (spring based), [facility] exercises (handout issued), red theraband for bilat shoulder strengthening (ER/IR)  GOALS: Goals reviewed with patient? Yes  SHORT TERM GOALS: Target date: 01/21/23  Pt will perform HEP with supv for increasing strength and coordination in B hands.  Baseline: Eval: Will review previous HEP and add to as appropriate; 01/14/23: Pt using theraputty; added theraband today to target ER/IR strengthening at bilat shoulders Goal status: in progress   2.  Pt will implement fall prevention strategies with min vc from spouse at least 50% of the time.  Baseline: Spouse reports pt with 4 falls over the last 6 months, noting decreased safety awareness/impulsivity; 01/14/23: spouse reports inconsistent use of walker at times and pt walks in socks without grips.  Goal status: in progress  LONG TERM GOALS: Target date:  03/04/2023  Pt will increase FOTO score by (TBD) to indicate improvement in perceived functional performance with daily tasks. Baseline: Eval: FOTO to be given next session; 01/14/23: OT FOTO same as PT (PT managing) Goal status: in progress  2.  Pt will utilize AE/activity modification to participate in face time calls with family [place] with set up/supv. Baseline: Eval: Pt reports he is struggling to accurately press buttons on his cell phone, often having spouse assist to manage a call (Initial recommendation at eval to transition to tablet for Face time calls to work with larger screen surface; spouse receptive); 03/15/22: No consistent changes implemented yet Goal status: in progress  3.  Pt will use adapted writing aids/utilize positioning strategies/activity modifications to enable pt to take simple notes when reading scripture and to write checks legibly. Baseline: Eval: Pt reports he has not been able to take notes in awhile d/t poor legibility and this task requiring increased effort and fatigue; 01/14/23: Pt tries to write checks as needed in the evening (more energy this time of day); variety of adapted pens have been tried; legibility inconsistent Goal status: in progress  4.  Pt will increase bilat grip strength by 5 or more lbs to improve ability to open containers.  Baseline: Eval: R grip: 30 lbs, L grip: 45 lbs; 01/14/23: R 38 lbs, L 39 lbs Goal status: in progress  5.  Pt will improve [facility] skills to increase efficiency with manipulation of eating and grooming utensils.   Baseline: Eval: R 33 sec, L 41 sec; 01/14/23: R 28 sec; L 29 sec Goal status: in progress  ASSESSMENT:  CLINICAL IMPRESSION: Pt reported that he practiced shirt donning strategies reviewed in last OT session, and found sitting down to don his shirt and donning both hands in shirt sleeves then stretching arms overhead were both efficient strategies for him.  Pt requested to focus on FMC/dexterity skills this  date, as pt acknowledges struggling to reposition small items within fingertips. Targeted these skills with activities noted above, with constant monitoring to grade motor planning challenges.  Pt requires increased time with these skills, specifically as  reps increase, but was encouraged to allow rest breaks and to alternate hands to improve efficiency with tasks noted above.  Pt continues to benefit from skilled OT to review/progress HEP, review/advise on activity modifications/AE, provide exercises and activities to promote hand strength and coordination skills, and reinforce fall prevention strategies to maximize safety, efficiency, and independence with daily tasks.  Pt./spouse continue to be in agreement with poc.   PERFORMANCE DEFICITS: in functional skills including ADLs, IADLs, coordination, dexterity, strength, pain, Fine motor control, Gross motor control, mobility, balance, body mechanics, endurance, decreased knowledge of use of DME, and UE functional use, cognitive skills including memory and safety awareness, and psychosocial skills including coping strategies, environmental adaptation, habits, and routines and behaviors.   IMPAIRMENTS: are limiting patient from ADLs, IADLs, leisure, and social participation.   CO-MORBIDITIES: has co-morbidities such as chronic pain syndrome, spinal stenosis, DDD, dizziness   that affects occupational performance. Patient will benefit from skilled OT to address above impairments and improve overall function.  MODIFICATION OR ASSISTANCE TO COMPLETE EVALUATION: No modification of tasks or assist necessary to complete an evaluation.  OT OCCUPATIONAL PROFILE AND HISTORY: Problem focused assessment: Including review of records relating to presenting problem.  CLINICAL DECISION MAKING: Moderate - several treatment options, min-mod task modification necessary  REHAB POTENTIAL: Good  EVALUATION COMPLEXITY: Low    PLAN:  OT FREQUENCY: 2x/week  OT  DURATION: 12 weeks  PLANNED INTERVENTIONS: 97535 self care/ADL training, 78295 therapeutic exercise, 97530 therapeutic activity, 97112 neuromuscular re-education, 97010 moist heat, 97010 cryotherapy, passive range of motion, balance training, functional mobility training, visual/perceptual remediation/compensation, psychosocial skills training, energy conservation, coping strategies training, patient/family education, and DME and/or AE instructions  RECOMMENDED OTHER SERVICES: None at this time  CONSULTED AND AGREED WITH PLAN OF CARE: family member/caregiver  PLAN FOR NEXT SESSION:   Danelle Earthly, MS, OTR/L 01/23/2023, 9:31 AM

## 2023-01-25 ENCOUNTER — Encounter: Payer: Self-pay | Admitting: Family Medicine

## 2023-01-25 ENCOUNTER — Ambulatory Visit (INDEPENDENT_AMBULATORY_CARE_PROVIDER_SITE_OTHER): Payer: Medicare HMO | Admitting: Family Medicine

## 2023-01-25 VITALS — BP 112/68 | Ht 67.0 in | Wt 121.2 lb

## 2023-01-25 DIAGNOSIS — G894 Chronic pain syndrome: Secondary | ICD-10-CM | POA: Diagnosis not present

## 2023-01-25 DIAGNOSIS — F411 Generalized anxiety disorder: Secondary | ICD-10-CM | POA: Diagnosis not present

## 2023-01-25 DIAGNOSIS — N400 Enlarged prostate without lower urinary tract symptoms: Secondary | ICD-10-CM | POA: Diagnosis not present

## 2023-01-25 DIAGNOSIS — G20B2 Parkinson's disease with dyskinesia, with fluctuations: Secondary | ICD-10-CM | POA: Diagnosis not present

## 2023-01-25 MED ORDER — OXYCODONE HCL 5 MG PO TABS
5.0000 mg | ORAL_TABLET | Freq: Three times a day (TID) | ORAL | 0 refills | Status: AC | PRN
Start: 2023-01-25 — End: ?

## 2023-01-25 NOTE — Patient Instructions (Addendum)
Thank you for coming to the office today.  Follow with Dr Laban Emperor [facility].  Refilled oxycodone as needed.  Okay to take the pain meds if you need them. Okay to take Tylenol 3000 in 24 hours, then Ibuprofen and then Oxycodone.  We will check PSA next time with your blood work  Consider the Flomax tamsulosin 0.4mg  daily for prostate health in future to improve urinary flow., Let me know if interested in this option.  Refilled Oxycodone   Please schedule a Follow-up Appointment to: Return in about 3 months (around 04/27/2023) for 3 month Follow-up Parkinsons, BPH, Pain med refill (Mon, Weds, Fri, prefer late / end of morning apt.  If you have any other questions or concerns, please feel free to call the office or send a message through MyChart. You may also schedule an earlier appointment if necessary.  Additionally, you may be receiving a survey about your experience at our office within a few days to 1 week by e-mail or mail. We value your feedback.  Saralyn Pilar, DO [facility], [place]

## 2023-01-25 NOTE — Assessment & Plan Note (Signed)
Followed by [facility] Parkinson's clinic On medication management currently

## 2023-01-25 NOTE — Assessment & Plan Note (Signed)
Chronic problem Managed on Alprazolam AS NEEDED  and Paroxetine Reviewed dosing on Alprazolam

## 2023-01-25 NOTE — Assessment & Plan Note (Signed)
Persistent chronic BPH now with some worsening BPH LUTS symptoms Despite on [place] therapy Due for PSA, it was not collected last time. He request to keep checking screening  Plan: Continue supplement [place] Offer Tamsulosin today, caution dizzy/BP orthostatic, but he prefers to keep on [employer], reconsider Tamsulosin Flomax Follow-up if worsening

## 2023-01-25 NOTE — Assessment & Plan Note (Signed)
Secondary to Parkinsons and muscle weakness / Scoliosis DDD lumbar spine Followed by [facility] for procedures Agree to keep managing his oxycodone IR 5mg  AS NEEDED here since he is taking infrequently. Advised he may take inc dose and we can update prescribing if indicated. Okay to still take Tylenol, NSAID, and Oxycodone

## 2023-01-25 NOTE — Progress Notes (Signed)
Subjective:    Patient ID: Joseph Arroyo, male    DOB: 07/02/1940, 82 y.o.   MRN: [id number]  Joseph Arroyo is a 82 y.o. male presenting on 01/25/2023 for Medical Management of Chronic Issues (Discuss pain with sitting for a long period of time, discuss PSA labs.)   HPI  Discussed the use of AI scribe software for clinical note transcription with the patient, who gave verbal consent to proceed.       Updates interval since last visit - Church gave them a hospital bed, no longer need any DME orders. - Says no PSA lab was acquired last time in August. - Difficulty sitting up straight. Often will have pain with the scoliosis in his back related to posture, that is difficult for him to maintain. Has upcoming visit [facility] 01/27/23, last year 2023 he received epidural injection with help prior to the holiday. He has scoliosis and chronic back pain. Worsening with weakness on Parkinsons - Taking Tylenol 1000mg  THREE TIMES A DAY regularly - Occasional take Ibuprofen AS NEEDED Taking Oxycodone IR 5mg  THREE TIMES A DAY AS NEEDED, he does not take every day. Not out of med but request refill now 3 months later. Taking Topical antifungal for toenail fungus onychomycosis, slow progress - If stiffness, rigidity or muscle difficulty - he was advised by Neurologist to chew a carbidopa-levodopa - BPH - Consider Flomax if need, already on Saw Palmetto max dose, he has difficulty urinating at toilet   OAB / Bladder Dysfunction On Saw Palmetto Frequent urination, waking up at night every 2-3 hours. Slow urinary flow when he does void. Having accidents at night, some incontinence. Trying planned voiding during day some benefit but still going frequently  Parkinson's Disease, dyskinesia Chronic Pain Syndrome Lumbar DDD, Scoliosis, Chronic Back Pain Generalized Anxiety Disorder   Followed by specialist team: - [facility], med management and PT - Meredith Staggers MD  [facility] - Dr Laban Emperor [facility]   Today primary concern is his chronic pain that is secondary to Parkinsons and his lumbar DDD with scoliosis and arthritis back pain. Previously tried RAF for back He is following with our office for pain management on Oxycodone IR 5mg  qh 8 hr AS NEEDED. He is taking oxycodone intermittently, not every day. Seems to be beneficial and helpful but cautious with taking it regularly. Currently Taking Tylenol 1000mg  THREE TIMES A DAY some missed doses, AS NEEDED with good results Taking Ibuprofen more rarely     Generalized Anxiety is worsening in setting of decline in health. He is on Xanax 0.25mg  alprazolam AS NEEDED per Psychiatry, asking about safety and interaction    He does require position that cannot be achieved by regular bed due to parkinsons and back pain scoliosis, he requires legs to be lifted and frequent position changes to avoid bed sore or pressure ulcer. He has muscle contracture of legs at times and weakness generalized with back and lower extremities. Tremors with parkinson interfere with his ability to transition and sit up in regular bed       10/13/2022   10:01 AM 08/04/2022    8:23 AM 06/25/2022    8:10 AM  Depression screen PHQ 2/9  Decreased Interest 0 0 0  Down, Depressed, Hopeless  0 0  PHQ - 2 Score 0 0 0       05/26/2022    4:13 PM 03/17/2021    3:49 PM 10/01/2020    1:28 PM  GAD 7 : Generalized Anxiety Score  Nervous, Anxious, on Edge 2 1 0  Control/stop worrying 1 0 0  Worry too much - different things 1 0 0  Trouble relaxing 1 0 0  Restless 0 0 0  Easily annoyed or irritable 1 0 0  Afraid - awful might happen 0 0 0  Total GAD 7 Score 6 1 0  Anxiety Difficulty Not difficult at all Not difficult at all Not difficult at all    Social History   Tobacco Use   Smoking status: Never   Smokeless tobacco: Never  Vaping Use   Vaping status: Never Used  Substance Use Topics   Alcohol use: No     Alcohol/week: 0.0 standard drinks of alcohol   Drug use: No    Review of Systems Per HPI unless specifically indicated above     Objective:    BP 112/68   Ht 5\' 7"  (1.702 m)   Wt 121 lb 3.2 oz (55 kg)   BMI 18.98 kg/m   Wt Readings from Last 3 Encounters:  01/25/23 121 lb 3.2 oz (55 kg)  10/29/22 119 lb (54 kg)  10/21/22 119 lb (54 kg)    Physical Exam Vitals and nursing note reviewed.  Constitutional:      General: He is not in acute distress.    Appearance: He is well-developed. He is not diaphoretic.     Comments: Chronically ill 82 yr male  HENT:     Head: Normocephalic and atraumatic.  Eyes:     General:        Right eye: No discharge.        Left eye: No discharge.     Conjunctiva/sclera: Conjunctivae normal.  Neck:     Thyroid: No thyromegaly.  Cardiovascular:     Rate and Rhythm: Regular rhythm. Bradycardia present.     Pulses: Normal pulses.     Heart sounds: Normal heart sounds. No murmur heard. Pulmonary:     Effort: Pulmonary effort is normal. No respiratory distress.     Breath sounds: Normal breath sounds. No wheezing or rales.  Musculoskeletal:     Cervical back: Normal range of motion and neck supple.     Comments: Seated, has manual walker Difficulty with back posture Able to stand with equipment assistance and ambulation is slower requires assistance to stand and ambulate. Some loss of muscle mass density  Lymphadenopathy:     Cervical: No cervical adenopathy.  Skin:    General: Skin is warm and dry.     Findings: No erythema or rash.  Neurological:     Mental Status: He is alert and oriented to person, place, and time. Mental status is at baseline.     Motor: Weakness present.     Gait: Gait abnormal.     Comments: Tremors resting.  Psychiatric:        Behavior: Behavior normal.     Comments: Well groomed, good eye contact, normal speech and thoughts     Results for orders placed or performed in visit on 10/23/22  BASIC METABOLIC  PANEL WITH GFR  Result Value Ref Range   Glucose, Bld 121 65 - 139 mg/dL   BUN 25 7 - 25 mg/dL   Creat 4.09 8.11 - 9.14 mg/dL   eGFR 92 > OR = 60 NW/GNF/6.21H0   BUN/Creatinine Ratio SEE NOTE: 6 - 22 (calc)   Sodium 140 135 - 146 mmol/L   Potassium 4.2 3.5 - 5.3 mmol/L  Chloride 101 98 - 110 mmol/L   CO2 32 20 - 32 mmol/L   Calcium 9.1 8.6 - 10.3 mg/dL  TSH  Result Value Ref Range   TSH 0.99 0.40 - 4.50 mIU/L  Lipid panel  Result Value Ref Range   Cholesterol 141 <200 mg/dL   HDL 66 > OR = 40 mg/dL   Triglycerides 53 <784 mg/dL   LDL Cholesterol (Calc) 62 mg/dL (calc)   Total CHOL/HDL Ratio 2.1 <5.0 (calc)   Non-HDL Cholesterol (Calc) 75 <696 mg/dL (calc)  Hemoglobin E9B  Result Value Ref Range   Hgb A1c MFr Bld 5.2 <5.7 % of total Hgb   Mean Plasma Glucose 103 mg/dL   eAG (mmol/L) 5.7 mmol/L      Assessment & Plan:   Problem List Items Addressed This Visit     Chronic pain syndrome (Chronic)    Secondary to Parkinsons and muscle weakness / Scoliosis DDD lumbar spine Followed by [facility] for procedures Agree to keep managing his oxycodone IR 5mg  AS NEEDED here since he is taking infrequently. Advised he may take inc dose and we can update prescribing if indicated. Okay to still take Tylenol, NSAID, and Oxycodone      Relevant Medications   oxyCODONE (OXY IR/ROXICODONE) 5 MG immediate release tablet   Parkinson's disease with dyskinesia (HCC) - Primary (Chronic)    Followed by [facility] Parkinson's clinic On medication management currently      BPH without obstruction/lower urinary tract symptoms    Persistent chronic BPH now with some worsening BPH LUTS symptoms Despite on Saw Palmetto therapy Due for PSA, it was not collected last time. He request to keep checking screening  Plan: Continue supplement Saw Palmetto Offer Tamsulosin today, caution dizzy/BP orthostatic, but he prefers to keep on [employer], reconsider Tamsulosin  Flomax Follow-up if worsening      GAD (generalized anxiety disorder)    Chronic problem Managed on Alprazolam AS NEEDED  and Paroxetine Reviewed dosing on Alprazolam         General Health Maintenance -Complete blood panel due in August 2025. Add PSA       No orders of the defined types were placed in this encounter.   Meds ordered this encounter  Medications   oxyCODONE (OXY IR/ROXICODONE) 5 MG immediate release tablet    Sig: Take 1 tablet (5 mg total) by mouth 3 (three) times daily as needed for severe pain (pain score 7-10) or moderate pain (pain score 4-6).    Dispense:  45 tablet    Refill:  0    Follow up plan: Return in about 3 months (around 04/27/2023) for 3 month Follow-up Parkinsons, BPH, Pain med refill (Mon, Weds, Fri, prefer late / end of morning apt.  FUTURE will need to add PSA to labs in August 2025.   Saralyn Pilar, DO [facility] 01/25/2023, 11:20 AM

## 2023-01-26 ENCOUNTER — Ambulatory Visit: Payer: Medicare HMO

## 2023-01-26 DIAGNOSIS — M5459 Other low back pain: Secondary | ICD-10-CM

## 2023-01-26 DIAGNOSIS — M6281 Muscle weakness (generalized): Secondary | ICD-10-CM

## 2023-01-26 DIAGNOSIS — G20C Parkinsonism, unspecified: Secondary | ICD-10-CM | POA: Diagnosis not present

## 2023-01-26 DIAGNOSIS — R262 Difficulty in walking, not elsewhere classified: Secondary | ICD-10-CM | POA: Diagnosis not present

## 2023-01-26 DIAGNOSIS — R278 Other lack of coordination: Secondary | ICD-10-CM

## 2023-01-26 DIAGNOSIS — R2681 Unsteadiness on feet: Secondary | ICD-10-CM | POA: Diagnosis not present

## 2023-01-26 NOTE — Therapy (Signed)
OUTPATIENT PHYSICAL THERAPY NEURO TREATMENT Patient Name: Joseph Arroyo MRN: [id number] DOB:08/20/1940, 82 y.o., male Today's Date: 01/26/2023  END OF SESSION:  PT End of Session - 01/26/23 1602     Visit Number 24    Number of Visits 33    Date for PT Re-Evaluation 02/23/23    Authorization Type [employer]    Progress Note Due on Visit 10    PT Start Time 1617    PT Stop Time 1653    PT Time Calculation (min) 36 min    Equipment Utilized During Treatment Gait belt    Activity Tolerance Patient tolerated treatment well;Patient limited by pain    Behavior During Therapy WFL for tasks assessed/performed                        Past Medical History:  Diagnosis Date   Allergic rhinitis due to pollen 11/21/2007   Brachial neuritis or radiculitis NOS    Cervicalgia    Concussion    age 5 - s/p accident   Costal chondritis    Depression    Essential and other specified forms of tremor    GERD (gastroesophageal reflux disease)    in past   Lumbago    Occlusion and stenosis of carotid artery without mention of cerebral infarction    Seizures (HCC)    age 48 - after concussion   Past Surgical History:  Procedure Laterality Date   CATARACT EXTRACTION Right 07/18/14   CATARACT EXTRACTION W/PHACO Left 08/01/2014   Procedure: CATARACT EXTRACTION PHACO AND INTRAOCULAR LENS PLACEMENT (IOC);  Surgeon: Lockie Mola, MD;  Location: [facility];  Service: Ophthalmology;  Laterality: Left;  IVA TOPICAL   COLONOSCOPY  2008   OTHER SURGICAL HISTORY  2002   ear surgery   STAPEDES SURGERY Right 2002   Patient Active Problem List   Diagnosis Date Noted   Dizziness 10/02/2022   Lumbar facet joint pain 06/25/2022   Osteoarthritis of facet joint of lumbar spine 06/25/2022   Lumbosacral facet joint hypertrophy 06/25/2022   Parkinson's disease with dyskinesia (HCC) 01/27/2022   Dysphagia, pharyngeal phase 09/17/2021   Cervicalgia 08/05/2021    Spondylosis without myelopathy or radiculopathy, lumbosacral region 06/24/2021   Lumbar central spinal stenosis, w/o neurogenic claudication (L4-5) 06/03/2021   Lumbar lateral recess stenosis (Bilateral: L2-3, L4-5) (Right: L3-4) 06/03/2021   Lumbosacral foraminal stenosis (Bilateral: L2-3, L3-4) (Left: L5-S1) 06/03/2021   Lumbar nerve root impingement (Right: L3 at L2-3 & L3-4) 06/03/2021   Ligamentum flavum hypertrophy (L3-4, L4-5) 06/03/2021   Abnormal MRI, lumbar spine (04/23/2021) 04/24/2021   Long term prescription benzodiazepine use (alprazolam) (Xanax) 03/24/2021   Grade 1 Retrolisthesis of L2/L3 (5 mm) and L3/L4 (3 mm) 03/24/2021   Levoscoliosis of lumbar spine (L3-4 apex) 03/24/2021   DDD (degenerative disc disease), lumbosacral 03/24/2021   Lumbosacral facet arthropathy (Left: L3-4, L4-5, and L5-S1) 03/24/2021   Tricompartment osteoarthritis of knee (Left) 03/24/2021   Baker cyst (Left) 03/24/2021   Chronic low back pain (1ry area of Pain) (Bilateral) (R>L) w/o sciatica 03/24/2021   Lumbar facet syndrome (Bilateral) 03/24/2021   Chronic lower extremity pain (2ry area of Pain) (Bilateral) (L>R) 03/24/2021   Lumbosacral radiculitis/sensory radiculopathy at L2 (Bilateral) 03/24/2021   Lumbosacral radiculitis/sensory radiculopathy at L3 (Bilateral) 03/24/2021   Chronic pain syndrome 03/23/2021   Pharmacologic therapy 03/23/2021   Disorder of skeletal system 03/23/2021   Problems influencing health status 03/23/2021   PAD (peripheral artery disease) (HCC) 10/01/2020  GAD (generalized anxiety disorder) 01/13/2018   MDD (major depressive disorder) 01/13/2018   Umbilical hernia 09/04/2017   Chronic knee pain (Left) 08/11/2017   Derangement of medial meniscus, posterior horn (Left) 08/11/2017   Vitamin D insufficiency 03/05/2016   BPH without obstruction/lower urinary tract symptoms 07/08/2015   Chronic fatigue 10/23/2014   Major depression, recurrent, full remission (HCC)  07/26/2013   Unsteady gait 07/26/2013   Orthostatic hypotension 07/26/2013   Depression 07/26/2013   Anxiety 06/23/2013   Chronic anxiety 06/23/2013   Tremor 05/18/2013   Bilateral carotid artery stenosis 08/30/2012    PCP: Smitty Cords, DO  REFERRING PROVIDER:   Mecum, Oswaldo Conroy, PA-C    REFERRING DIAG: R42 (ICD-10-CM) - Dizziness   THERAPY DIAG:  Other low back pain  Muscle weakness (generalized)  Other lack of coordination  Unsteadiness on feet  Difficulty in walking, not elsewhere classified  ONSET DATE: August 1st 2024  Rationale for Evaluation and Treatment: Rehabilitation  SUBJECTIVE:   SUBJECTIVE STATEMENT Pt reports pain level is currently a 6/10. His energy is low at the moment.  Pt accompanied by: significant other   PERTINENT HISTORY:  The pt is a pleasant 82 yo male referred to PT for dizziness. Pt known to clinic, previously seen for falls in the setting of PD.  Pt spouse present for eval and reports pt was seen by neurologist recently at [facility] following onset of dizziness about two-three weeks ago. He reports they think he has BPPV.  PT spouse reports he had blood work done at this visit which "didn't show anything" and was normal. Pt unsure if his symptoms come on when he is still at rest. He describes his symptoms as spinning. This can be triggered when he lies on his back and also when he sits up from supine. Spinning lasts for minutes. He reports no nausea.  Pt sometimes sees double when he looks to the side and this has been going on for weeks. Denies any one sided weakness or other changes. He has fallen 4 times in past 3 months. Pt spouse reports pt had a fall about a week prior to dizziness onset. Pt reports no neck pain, has hx of LBP. His spouse reports decline in his mobility since this started. Pt uses RW at baseline for ambulation. Pt now being seen primarily for PD impairments. Pt PMH significant for Parkinson's disease, concussion,  cervicalgia, depression, seizures, chronic L knee pain, chronic anxiety, DDD, PAD, chronic pain syndrome, chronic low back pain, please refer to chart for full, extensive, PMH. PAIN:  Are you having pain? No Back pain hurting prior, took 3 ibuprophen.   PRECAUTIONS: Fall   WEIGHT BEARING RESTRICTIONS: No  FALLS: Has patient fallen in last 6 months? Yes. Number of falls 4  LIVING ENVIRONMENT: via chart and confirmed by pt in session Lives with: lives with their spouse Lives in: House/apartment Stairs: 4 Has following equipment at home: Walker - 2 wheeled  PLOF:  needs assistance, pt with PD that affected his mobility/ADLs  PATIENT GOALS: get rid of dizziness  OBJECTIVE:     TREATMENT:  DATE: 01/26/23  Gait belt donned for pt safety with upright activities, heat applied to low back while pt performing seated interventions (reports heat feels good, no adverse reaction to heat)  NMR: Swing BUE into shoulder flexion/upward reach 2x10 bilat UE Seated LTL step and reach 10x, 6x, each way   TE: LAQ  3x10 each LE - cuing for decreased speed Seated adductor squeeze with p.ball 3x10 bilat LE  Seated march 3x10-15 each LE  Seated LTL step over weight 2x10 each LE  Seated DF/PF from half-bolster 2x15 of each - rates medium Ambulation around clinic with RW for strength/endurance x 296 ft.      PATIENT EDUCATION: Education details: exercise technique Person education: patient  HOME EXERCISE PROGRAM: Continue as previously given: Access Code: [email or web address] URL: [email or web address] Date: 01/12/2023 Prepared by: Temple Pacini  Exercises - Seated Shoulder Flexion Full Range Single Arm  - 1 x daily - 7 x weekly - 2 sets - 10 reps  Access Code: [email or web address] URL: [email or web address] Date: 12/08/2022 Prepared by: Temple Pacini  Exercises - Seated March  - 1 x  daily - 5-6 x weekly - 4 sets - 10 reps - Seated Long Arc Quad  - 1 x daily - 5-6 x weekly - 2 sets - 15 reps - Seated Heel Toe Raises  - 1 x daily - 5-6 x weekly - 2 sets - 20 reps   Access Code: [email or web address] URL: [email or web address] Date: 12/24/2022 Prepared by: Tomasa Hose  Exercises - Standing Lumbar Extension at Wall - Forearms  - 1 x daily - 7 x weekly - 3 sets - 10 reps - Prone Press Up On Elbows  - 1 x daily - 7 x weekly - 3 sets - 10 reps - Seated Lumbar Extension  - 1 x daily - 7 x weekly - 3 sets - 10 reps  GOALS:   Goals reviewed with patient? Yes   SHORT TERM GOALS: Target date: 03/09/2023   Patient will be independent in home exercise program to improve dizziness, balance and mobility for better safety and functional independence with ADLs. Baseline: to be initiated future visit/as needed 11/12:  Goal status: IN PROGRESS    LONG TERM GOALS: Target date: 04/20/2023    Patient will increase FOTO score to equal to or greater than 53    to demonstrate statistically significant improvement in mobility and quality of life.  Baseline: 44; 01/12/23: 52  Goal status: PARTIALLY MET   2.  Patient will reduce dizziness handicap inventory score to <50, for less dizziness with ADLs and increased safety with home and work tasks.  Baseline: deferred, will complete as pt able: 10/10: 48, still has difficulty with bed mobility  Goal status: MET  3.  The pt will report at least a 60% decrease in positional or movement triggered dizziness symptoms in order to indicate decreased fall risk and increased balance/safety with mobility. Baseline: pt presents with decreased mobility due to dizziness, dizziness/vertigo triggered by particular positional; changes; 10/10: 60-70%  Goal status: MET  4.  Patient will reduce timed up and go to <11 seconds to reduce fall risk and demonstrate improved transfer/gait ability. Baseline: 9/5: 18 sec; 10/10: 16 seconds seconds; 11/12: 17.5  sec with RW  Goal status: IN PROGRESS   5.Patient (> 56 years old) will complete five times sit to stand test in < 15 seconds indicating an increased LE strength and improved balance. Baseline: 12/01/22: 26 sec; 11/12: 22.5 sec hands-free  Goal status: IN PROGRESS  6. Patient will increase 10 meter walk test to >1.17m/s as to improve gait speed for better community ambulation and to reduce fall risk. Baseline: 12/01/22: 0.6 m/s with RW; 11/12:  0.95 m/s with RW  Goal status: PARTIALLY MET   ASSESSMENT:  CLINICAL IMPRESSION: Pt presents to PT with higher level of baseline LBP (6/10), resulting in shortened session due to pain limitation with interventions.  Pt still highly motivated throughout and tolerated seated interventions well and gait fair. Pt will continue to benefit from skilled therapy to address remaining deficits in order to improve overall QoL and return to PLOF.      OBJECTIVE IMPAIRMENTS: Abnormal gait, decreased activity tolerance, decreased balance, decreased coordination, decreased mobility, difficulty walking, decreased strength, dizziness, impaired vision/preception, improper body mechanics, and postural dysfunction.   ACTIVITY LIMITATIONS: lifting, bending, squatting, stairs, transfers, bed mobility, toileting, dressing, hygiene/grooming, and locomotion level  PARTICIPATION LIMITATIONS: meal prep, cleaning, laundry, shopping, community activity, and yard work  PERSONAL FACTORS: Age, Fitness, and 3+ comorbidities: Pt PMH significant for Parkinson's disease, concussion, cervicalgia, depression, seizures, chronic L knee pain, chronic anxiety, DDD, PAD, chronic pain syndrome, chronic low back pain, please refer to chart for full, extensive, PMH.  are also affecting patient's functional outcome.   REHAB POTENTIAL: Good  CLINICAL DECISION MAKING: Evolving/moderate complexity  EVALUATION COMPLEXITY: Moderate   PLAN:  PT FREQUENCY: 1-2x/week  PT DURATION: 12  weeks  PLANNED INTERVENTIONS: Therapeutic exercises, Therapeutic activity, Neuromuscular re-education, Balance training, Gait training, Patient/Family education, Self Care, Joint mobilization, Stair training, Vestibular training, Canalith repositioning, Visual/preceptual remediation/compensation, DME instructions, Electrical stimulation, Wheelchair mobility training, Spinal mobilization, Cryotherapy, Moist heat, Splintting, Taping, Traction, Manual therapy, and Re-evaluation  PLAN FOR NEXT SESSION: attempt standing/supported PD interventions, balance    Temple Pacini PT, DPT  Physical Therapist - [facility]  01/26/23, 5:00 PM

## 2023-01-26 NOTE — Therapy (Signed)
OUTPATIENT OCCUPATIONAL THERAPY NEURO TREATMENT NOTE  Patient Name: Joseph Arroyo MRN: [id number] DOB:1941-01-31, 82 y.o., male Today's Date: 01/26/2023  PCP: Dr. Saralyn Pilar REFERRING PROVIDER: PCP  END OF SESSION:  OT End of Session - 01/26/23 1900     Visit Number 13    Number of Visits 24    Date for OT Re-Evaluation 03/04/23    Progress Note Due on Visit 10    OT Start Time 1535    OT Stop Time 1615    OT Time Calculation (min) 40 min    Equipment Utilized During Treatment RW    Activity Tolerance Patient tolerated treatment well    Behavior During Therapy [facility] for tasks assessed/performed            Past Medical History:  Diagnosis Date   Allergic rhinitis due to pollen 11/21/2007   Brachial neuritis or radiculitis NOS    Cervicalgia    Concussion    age 41 - s/p accident   Costal chondritis    Depression    Essential and other specified forms of tremor    GERD (gastroesophageal reflux disease)    in past   Lumbago    Occlusion and stenosis of carotid artery without mention of cerebral infarction    Seizures [facility])    age 52 - after concussion   Past Surgical History:  Procedure Laterality Date   CATARACT EXTRACTION Right 07/18/14   CATARACT EXTRACTION W/PHACO Left 08/01/2014   Procedure: CATARACT EXTRACTION PHACO AND INTRAOCULAR LENS PLACEMENT (IOC);  Surgeon: Lockie Mola, MD;  Location: [facility];  Service: Ophthalmology;  Laterality: Left;  IVA TOPICAL   COLONOSCOPY  2008   OTHER SURGICAL HISTORY  2002   ear surgery   STAPEDES SURGERY Right 2002   Patient Active Problem List   Diagnosis Date Noted   Dizziness 10/02/2022   Lumbar facet joint pain 06/25/2022   Osteoarthritis of facet joint of lumbar spine 06/25/2022   Lumbosacral facet joint hypertrophy 06/25/2022   Parkinson's disease with dyskinesia (HCC) 01/27/2022   Dysphagia, pharyngeal phase 09/17/2021   Cervicalgia 08/05/2021   Spondylosis without myelopathy  or radiculopathy, lumbosacral region 06/24/2021   Lumbar central spinal stenosis, w/o neurogenic claudication (L4-5) 06/03/2021   Lumbar lateral recess stenosis (Bilateral: L2-3, L4-5) (Right: L3-4) 06/03/2021   Lumbosacral foraminal stenosis (Bilateral: L2-3, L3-4) (Left: L5-S1) 06/03/2021   Lumbar nerve root impingement (Right: L3 at L2-3 & L3-4) 06/03/2021   Ligamentum flavum hypertrophy (L3-4, L4-5) 06/03/2021   Abnormal MRI, lumbar spine (04/23/2021) 04/24/2021   Long term prescription benzodiazepine use (alprazolam) (Xanax) 03/24/2021   Grade 1 Retrolisthesis of L2/L3 (5 mm) and L3/L4 (3 mm) 03/24/2021   Levoscoliosis of lumbar spine (L3-4 apex) 03/24/2021   DDD (degenerative disc disease), lumbosacral 03/24/2021   Lumbosacral facet arthropathy (Left: L3-4, L4-5, and L5-S1) 03/24/2021   Tricompartment osteoarthritis of knee (Left) 03/24/2021   Baker cyst (Left) 03/24/2021   Chronic low back pain (1ry area of Pain) (Bilateral) (R>L) w/o sciatica 03/24/2021   Lumbar facet syndrome (Bilateral) 03/24/2021   Chronic lower extremity pain (2ry area of Pain) (Bilateral) (L>R) 03/24/2021   Lumbosacral radiculitis/sensory radiculopathy at L2 (Bilateral) 03/24/2021   Lumbosacral radiculitis/sensory radiculopathy at L3 (Bilateral) 03/24/2021   Chronic pain syndrome 03/23/2021   Pharmacologic therapy 03/23/2021   Disorder of skeletal system 03/23/2021   Problems influencing health status 03/23/2021   PAD (peripheral artery disease) (HCC) 10/01/2020   GAD (generalized anxiety disorder) 01/13/2018   MDD (major depressive disorder)  01/13/2018   Umbilical hernia 09/04/2017   Chronic knee pain (Left) 08/11/2017   Derangement of medial meniscus, posterior horn (Left) 08/11/2017   Vitamin D insufficiency 03/05/2016   BPH without obstruction/lower urinary tract symptoms 07/08/2015   Chronic fatigue 10/23/2014   Major depression, recurrent, full remission (HCC) 07/26/2013   Unsteady gait 07/26/2013    Orthostatic hypotension 07/26/2013   Depression 07/26/2013   Anxiety 06/23/2013   Chronic anxiety 06/23/2013   Tremor 05/18/2013   Bilateral carotid artery stenosis 08/30/2012   ONSET DATE: 2015  REFERRING DIAG: Parkinson's Disease  THERAPY DIAG:  Muscle weakness (generalized)  Other lack of coordination  Atypical parkinsonism (HCC)  Rationale for Evaluation and Treatment: Rehabilitation  SUBJECTIVE:  SUBJECTIVE STATEMENT: Pt requested again to focus on some hand coordination this date as he did verbalize a good amount of back pain this afternoon. Pt accompanied by: significant other, spouse, [name]  PERTINENT HISTORY: Hx of Parkinson's disease (HCC); Chronic knee pain (Left); Derangement of medial meniscus, posterior horn (Left); PAD (peripheral artery disease) (HCC); Chronic pain syndrome; DDD (degenerative disc disease) lumbosacral; osteoarthritis of knee (Left); Chronic low back pain, Lumbar facet syndrome (Bilateral); Chronic lower extremity pain.   PRECAUTIONS: Fall  WEIGHT BEARING RESTRICTIONS: No  PAIN: 01/26/23: 6/10 pain low back Are you having pain? Yes: NPRS scale: 8-9/10 Pain location: low back  Pain description: aching, sometimes sharp  Aggravating factors: activity  Relieving factors: lying down, heat, ice pack  FALLS: Has patient fallen in last 6 months? Yes. Number of falls 4  LIVING ENVIRONMENT: Lives with: lives with their spouse Lives in: 1 level home Stairs: 4 steps, a landing, then 3 more steps with bilat rails.  Has following equipment at home: Dan Humphreys - 2 wheeled, Shower bench, bed side commode, and Grab bars   PLOF: Needs assistance with ADLs.  Pt performs basic self care with set up assist, extra time to manage clothing fasteners, occasional min A on days with worsening Parkinson's symptoms or back pain.    PATIENT GOALS: Pt wants to regain some strength and coordination skills in bilat hands.   OBJECTIVE:  Note: Objective measures were  completed at Evaluation unless otherwise noted.  HAND DOMINANCE: Right  ADLs: Overall ADLs: Extensive time required; occasional min A from spouse Transfers/ambulation related to ADLs: supv-modified indep with RW Eating: difficulty cutting food, struggles with loading food on a fork (uses built up foam handles at times); spouse cuts steak Grooming: electric toothbrush and standard toothbrush, but mostly uses electric; increased difficulty with brushing teeth and shaving (has electric trimmer and regular razor) UB Dressing: magnetic buttons on some shirts; more difficult to button shirts, occasional help with donning a shirt (min A)  LB Dressing: difficulty tying shoe laces, increased time; uses long shoe horn, has a hip kit; occasional assist to don socks  Toileting: modified indep/extra time  Bathing: able to take a shower with supv with tub bench Tub Shower transfers: transfer tub bench Equipment:  Pt does have hip kit and he regularly uses the long shoe horn.  Spouse reports pt may need some review with the other tools.   IADLs: Shopping: Spouse manages Light housekeeping: Spouse manages Meal Prep: Spouse manages Community mobility: Uses RW and requires frequent sitting breaks d/t back pain Medication management: modified Engineer, materials: Extra time for writing checks, with occasional assist from spouse on days when tremors are worse Handwriting: 90% legible, though pt reports this is an exceptional day today, and pt reports he's recently had  to rip of checks and have spouse take over d/t poor legibility.  MOBILITY STATUS: Hx of falls; spouse reports pt to have had 4 falls in the last 6 mo  POSTURE COMMENTS:  rounded shoulders, increased thoracic kyphosis, and hx of scoliosis , L shoulder rests significantly lower than R Sitting balance: Supports self with one extremity  ACTIVITY TOLERANCE: Activity tolerance: limited by low back pain   FUNCTIONAL OUTCOME  MEASURES: FOTO: TBD  (PT managing FOTO)  UPPER EXTREMITY ROM:  BUEs WFL  UPPER EXTREMITY MMT:     MMT Right eval Left eval  Shoulder flexion 5 5  Shoulder abduction 5 5  Shoulder adduction    Shoulder extension    Shoulder internal rotation 4 4  Shoulder external rotation 4 4  Middle trapezius    Lower trapezius    Elbow flexion    Elbow extension    Wrist flexion    Wrist extension    Wrist ulnar deviation    Wrist radial deviation    Wrist pronation    Wrist supination    (Blank rows = not tested)  HAND FUNCTION: Eval: Grip strength: Right: 30 lbs; Left: 45 lbs, Lateral pinch: Right: 12 lbs, Left: 5 lbs, and 3 point pinch: Right: 10 lbs, Left: 6 lbs 01/14/23: Grip strength: Right: 38 lbs; Left: 39 lbs, Lateral pinch: Right: 11 lbs, Left: 5 lbs, and 3 point pinch: Right: 9 lbs, Left: 5 lbs  COORDINATION: Eval: 9 Hole Peg test: Right: 33 sec; Left: 41 sec 01/14/23: 9 Hole Peg test: Right: 28 sec; Left: 29 sec  SENSATION: Not tested  EDEMA: No visible edema  MUSCLE TONE: RUE: Within functional limits and LUE: Within functional limits  COGNITION: Overall cognitive status: Within functional limits for tasks assessed; spouse reports some impulsivity/poor safety awareness contributing to falls at home  VISION: No recent changes; wears glasses for reading Visual history: macular degeneration, hx of cataract removal both eyes  PERCEPTION: Not tested  PRAXIS: Impaired: Motor planning  TODAY'S TREATMENT:                                                                                                                              DATE: 01/26/23 Moist heat applied to low back with support of lumbar pillow; a second pillow wedged between R side and arm rest of chair for midline position and back pain management throughout session.  Therapeutic Activity: Participation in activities to promote FMC/dexterity skills in R/L hands: Participation in tool use, including flat head  screw driver, Phillips head screw driver, and varying sizes of Allen wrenches in order to tighten small and medium size nuts and bolts (pt threaded nuts with R hand-vision occluded, stabilized bolt with L hand- visible, then tightened with above noted tools-visible).  Pt used flat head screw driver to loosen a clinic pulse oximeter and replaced 2 AAA batteries before sliding cover back on, requiring alignment of cover into grooves.  Pt did  require min guard when using flat head to loosen the cover d/t tremor in RUE.  PATIENT EDUCATION: Education details: FMC/dexterity skills Person educated: Patient and Spouse Education method: Explanation, demo Education comprehension: verbalized understanding, demonstrated understanding with vc and tactile cues  HOME EXERCISE PROGRAM: Pt instructed to use his theraputty or his hand strengthener (spring based), [facility] exercises (handout issued), red theraband for bilat shoulder strengthening (ER/IR)  GOALS: Goals reviewed with patient? Yes  SHORT TERM GOALS: Target date: 01/21/23  Pt will perform HEP with supv for increasing strength and coordination in B hands.  Baseline: Eval: Will review previous HEP and add to as appropriate; 01/14/23: Pt using theraputty; added theraband today to target ER/IR strengthening at bilat shoulders Goal status: in progress   2.  Pt will implement fall prevention strategies with min vc from spouse at least 50% of the time.  Baseline: Spouse reports pt with 4 falls over the last 6 months, noting decreased safety awareness/impulsivity; 01/14/23: spouse reports inconsistent use of walker at times and pt walks in socks without grips.  Goal status: in progress  LONG TERM GOALS: Target date: 03/04/2023  Pt will increase FOTO score by (TBD) to indicate improvement in perceived functional performance with daily tasks. Baseline: Eval: FOTO to be given next session; 01/14/23: OT FOTO same as PT (PT managing) Goal status: in progress  2.   Pt will utilize AE/activity modification to participate in face time calls with family [place] with set up/supv. Baseline: Eval: Pt reports he is struggling to accurately press buttons on his cell phone, often having spouse assist to manage a call (Initial recommendation at eval to transition to tablet for Face time calls to work with larger screen surface; spouse receptive); 03/15/22: No consistent changes implemented yet Goal status: in progress  3.  Pt will use adapted writing aids/utilize positioning strategies/activity modifications to enable pt to take simple notes when reading scripture and to write checks legibly. Baseline: Eval: Pt reports he has not been able to take notes in awhile d/t poor legibility and this task requiring increased effort and fatigue; 01/14/23: Pt tries to write checks as needed in the evening (more energy this time of day); variety of adapted pens have been tried; legibility inconsistent Goal status: in progress  4.  Pt will increase bilat grip strength by 5 or more lbs to improve ability to open containers.  Baseline: Eval: R grip: 30 lbs, L grip: 45 lbs; 01/14/23: R 38 lbs, L 39 lbs Goal status: in progress  5.  Pt will improve [facility] skills to increase efficiency with manipulation of eating and grooming utensils.   Baseline: Eval: R 33 sec, L 41 sec; 01/14/23: R 28 sec; L 29 sec Goal status: in progress  ASSESSMENT:  CLINICAL IMPRESSION: Pt showing visible signs of pain in low back this date, requesting focus on bilat hand coordination exercises as ADL tasks tend to contribute to more back pain.  Focussed on tool use with a variety of tools noted above.  Pt did requiring min guard to reduce a RUE tremor to ensure safety when working to use flat head screw driver to wedge open cover of pulse oximeter for battery replacement, but otherwise was able to manage tightening nuts and bolts of varying sizes using an Customer service manager and a English as a second language teacher with  supervision only.  Pt continues to benefit from skilled OT to review/progress HEP, review/advise on activity modifications/AE, provide exercises and activities to promote hand strength and coordination skills, and reinforce  fall prevention strategies to maximize safety, efficiency, and independence with daily tasks.  Pt./spouse continue to be in agreement with poc.   PERFORMANCE DEFICITS: in functional skills including ADLs, IADLs, coordination, dexterity, strength, pain, Fine motor control, Gross motor control, mobility, balance, body mechanics, endurance, decreased knowledge of use of DME, and UE functional use, cognitive skills including memory and safety awareness, and psychosocial skills including coping strategies, environmental adaptation, habits, and routines and behaviors.   IMPAIRMENTS: are limiting patient from ADLs, IADLs, leisure, and social participation.   CO-MORBIDITIES: has co-morbidities such as chronic pain syndrome, spinal stenosis, DDD, dizziness   that affects occupational performance. Patient will benefit from skilled OT to address above impairments and improve overall function.  MODIFICATION OR ASSISTANCE TO COMPLETE EVALUATION: No modification of tasks or assist necessary to complete an evaluation.  OT OCCUPATIONAL PROFILE AND HISTORY: Problem focused assessment: Including review of records relating to presenting problem.  CLINICAL DECISION MAKING: Moderate - several treatment options, min-mod task modification necessary  REHAB POTENTIAL: Good  EVALUATION COMPLEXITY: Low    PLAN:  OT FREQUENCY: 2x/week  OT DURATION: 12 weeks  PLANNED INTERVENTIONS: 97535 self care/ADL training, 81191 therapeutic exercise, 97530 therapeutic activity, 97112 neuromuscular re-education, 97010 moist heat, 97010 cryotherapy, passive range of motion, balance training, functional mobility training, visual/perceptual remediation/compensation, psychosocial skills training, energy  conservation, coping strategies training, patient/family education, and DME and/or AE instructions  RECOMMENDED OTHER SERVICES: None at this time  CONSULTED AND AGREED WITH PLAN OF CARE: family member/caregiver  PLAN FOR NEXT SESSION:   Danelle Earthly, MS, OTR/L 01/26/2023, 7:01 PM

## 2023-01-26 NOTE — Progress Notes (Unsigned)
PROVIDER NOTE: Information contained herein reflects review and annotations entered in association with encounter. Interpretation of such information and data should be left to medically-trained personnel. Information provided to patient can be located elsewhere in the medical record under "Patient Instructions". Document created using STT-dictation technology, any transcriptional errors that may result from process are unintentional.    Patient: Joseph Arroyo  Service Category: E/M  Provider: Oswaldo Done, MD  DOB: 1940-04-25  DOS: 01/27/2023  Referring Provider: Saralyn Pilar *  MRN: [id number]  Specialty: Interventional Pain Management  PCP: Smitty Cords, DO  Type: Established Patient  Setting: Ambulatory outpatient    Location: Office  Delivery: Face-to-face     HPI  [name], a 82 y.o. year old male, is here today because of his Chronic bilateral low back pain without sciatica [M54.50, G89.29]. [name]'s primary complain today is 82 No chief complaint on file.  Pertinent problems: Joseph Arroyo has Chronic knee pain (Left); Derangement of medial meniscus, posterior horn (Left); PAD (peripheral artery disease) (HCC); Chronic pain syndrome; Grade 1 Retrolisthesis of L2/L3 (5 mm) and L3/L4 (3 mm); Levoscoliosis of lumbar spine (L3-4 apex); DDD (degenerative disc disease), lumbosacral; Lumbosacral facet arthropathy (Left: L3-4, L4-5, and L5-S1); Tricompartment osteoarthritis of knee (Left); Baker cyst (Left); Chronic low back pain (1ry area of Pain) (Bilateral) (R>L) w/o sciatica; Lumbar facet syndrome (Bilateral); Chronic lower extremity pain (2ry area of Pain) (Bilateral) (L>R); Lumbosacral radiculitis/sensory radiculopathy at L2 (Bilateral); Lumbosacral radiculitis/sensory radiculopathy at L3 (Bilateral); Abnormal MRI, lumbar spine (04/23/2021); Lumbar central spinal stenosis, w/o neurogenic claudication (L4-5); Lumbar lateral recess stenosis  (Bilateral: L2-3, L4-5) (Right: L3-4); Lumbosacral foraminal stenosis (Bilateral: L2-3, L3-4) (Left: L5-S1); Lumbar nerve root impingement (Right: L3 at L2-3 & L3-4); Ligamentum flavum hypertrophy (L3-4, L4-5); Spondylosis without myelopathy or radiculopathy, lumbosacral region; Cervicalgia; Parkinson's disease with dyskinesia (HCC); Lumbar facet joint pain; Osteoarthritis of facet joint of lumbar spine; and Lumbosacral facet joint hypertrophy on their pertinent problem list. Pain Assessment: Severity of   is reported as a  /10. Location:    / . Onset:  . Quality:  . Timing:  . Modifying factor(s):  Marland Kitchen Vitals:  vitals were not taken for this visit.  BMI: Estimated body mass index is 18.98 kg/m as calculated from the following:   Height as of 01/25/23: 5\' 7"  (1.702 m).   Weight as of 01/25/23: 121 lb 3.2 oz (55 kg). Last encounter: 10/29/2022. Last procedure: 10/13/2022.  Reason for encounter: medication management. *** Discussed the use of AI scribe software for clinical note transcription with the patient, who gave verbal consent to proceed.  History of Present Illness          Pharmacotherapy Assessment  Analgesic: No chronic opioid analgesics therapy prescribed by our practice. None MME/day: 0 mg/day   Monitoring: [place] PMP: PDMP reviewed during this encounter.       Pharmacotherapy: No side-effects or adverse reactions reported. Compliance: No problems identified. Effectiveness: Clinically acceptable.  No notes on file  No results found for: "CBDTHCR" No results found for: "D8THCCBX" No results found for: "D9THCCBX"  UDS:  Summary  Date Value Ref Range Status  03/25/2021 Note  Final    Comment:    ==================================================================== Compliance Drug Analysis, Ur ==================================================================== Test                             Result       Flag  Units  Drug Present and Declared for Prescription  Verification   Gabapentin                     PRESENT      EXPECTED   Baclofen                       PRESENT      EXPECTED   Paroxetine                     PRESENT      EXPECTED   Ibuprofen                      PRESENT      EXPECTED  Drug Present not Declared for Prescription Verification   Acetaminophen                  PRESENT      UNEXPECTED  Drug Absent but Declared for Prescription Verification   Alprazolam                     Not Detected UNEXPECTED ng/mg creat ==================================================================== Test                      Result    Flag   Units      Ref Range   Creatinine              122              mg/dL      >=96 ==================================================================== Declared Medications:  The flagging and interpretation on this report are based on the  following declared medications.  Unexpected results may arise from  inaccuracies in the declared medications.   **Note: The testing scope of this panel includes these medications:   Alprazolam  Baclofen (Lioresal)  Gabapentin (Neurontin)  Paroxetine (Paxil)   **Note: The testing scope of this panel does not include small to  moderate amounts of these reported medications:   Ibuprofen (Advil)   **Note: The testing scope of this panel does not include the  following reported medications:   Betamethasone (Lotrisone)  Carbidopa (Sinemet)  Clotrimazole (Lotrisone)  Folic Acid  Levodopa (Sinemet)  Lutein  Magnesium  Omega-3 Fatty Acids  Pramipexole (Mirapex)  Supplement  Ubiquinone (CoQ10)  Vitamin B ==================================================================== For clinical consultation, please call [phone]. ====================================================================       ROS  Constitutional: Denies any fever or chills Gastrointestinal: No reported hemesis, hematochezia, vomiting, or acute GI distress Musculoskeletal: Denies any acute  onset joint swelling, redness, loss of ROM, or weakness Neurological: No reported episodes of acute onset apraxia, aphasia, dysarthria, agnosia, amnesia, paralysis, loss of coordination, or loss of consciousness  Medication Review  ALPRAZolam, B Complex Vitamins, Capsicum-Garlic, Hawthorn, L-Methylfolate, Lutein, Naftifine HCl, PARoxetine, Saw Palmetto (Serenoa repens), acetaminophen, baclofen, carbidopa-levodopa, ciclopirox, clobetasol, gabapentin, and oxyCODONE  History Review  Allergy: Mr. Holdman is allergic to prednisone and wheat. Drug: Mr. Karney  reports no history of drug use. Alcohol:  reports no history of alcohol use. Tobacco:  reports that he has never smoked. He has never used smokeless tobacco. Social: Mr. Schults  reports that he has never smoked. He has never used smokeless tobacco. He reports that he does not drink alcohol and does not use drugs. Medical:  has a past medical history of Allergic rhinitis due to pollen (11/21/2007), Brachial neuritis or radiculitis NOS, Cervicalgia, Concussion,  Costal chondritis, Depression, Essential and other specified forms of tremor, GERD (gastroesophageal reflux disease), Lumbago, Occlusion and stenosis of carotid artery without mention of cerebral infarction, and Seizures (HCC). Surgical: Mr. Catino  has a past surgical history that includes Other surgical history (2002); Colonoscopy (2008); Stapedes surgery (Right, 2002); Cataract extraction (Right, 07/18/14); and Cataract extraction w/PHACO (Left, 08/01/2014). Family: family history includes Asthma in his father; Heart failure in his mother.  Laboratory Chemistry Profile   Renal Lab Results  Component Value Date   BUN 25 10/23/2022   CREATININE 0.73 10/23/2022   BCR SEE NOTE: 10/23/2022   GFRAA 100 09/07/2019   GFRNONAA >60 03/24/2021    Hepatic Lab Results  Component Value Date   AST 17 10/02/2022   ALT 7 (L) 10/02/2022   ALBUMIN 4.1 03/24/2021   ALKPHOS 51 03/24/2021     Electrolytes Lab Results  Component Value Date   NA 140 10/23/2022   K 4.2 10/23/2022   CL 101 10/23/2022   CALCIUM 9.1 10/23/2022   MG 2.0 03/24/2021    Bone Lab Results  Component Value Date   VD25OH 42 10/10/2021   25OHVITD1 32 03/24/2021   25OHVITD2 <1.0 03/24/2021   25OHVITD3 31 03/24/2021    Inflammation (CRP: Acute Phase) (ESR: Chronic Phase) Lab Results  Component Value Date   CRP 0.5 03/24/2021   ESRSEDRATE 4 03/24/2021         Note: Above Lab results reviewed.  Recent Imaging Review  DG PAIN CLINIC C-ARM 1-60 MIN NO REPORT Fluoro was used, but no Radiologist interpretation will be provided.  Please refer to "NOTES" tab for provider progress note. Note: Reviewed        Physical Exam  General appearance: Well nourished, well developed, and well hydrated. In no apparent acute distress Mental status: Alert, oriented x 3 (person, place, & time)       Respiratory: No evidence of acute respiratory distress Eyes: PERLA Vitals: There were no vitals taken for this visit. BMI: Estimated body mass index is 18.98 kg/m as calculated from the following:   Height as of 01/25/23: 5\' 7"  (1.702 m).   Weight as of 01/25/23: 121 lb 3.2 oz (55 kg). Ideal: Ideal body weight: 66.1 kg (145 lb 11.6 oz)  Assessment   Diagnosis Status  1. Chronic low back pain (1ry area of Pain) (Bilateral) (R>L) w/o sciatica   2. Grade 1 Retrolisthesis of L2/L3 (5 mm) and L3/L4 (3 mm)   3. Levoscoliosis of lumbar spine (L3-4 apex)    Controlled Controlled Controlled   Updated Problems: No problems updated.  Plan of Care  Problem-specific:  Assessment and Plan            Mr. Mr. Renno Farooqui has a current medication list which includes the following long-term medication(s): gabapentin, paroxetine, and paroxetine.  Pharmacotherapy (Medications Ordered): No orders of the defined types were placed in this encounter.  Orders:  No orders of the defined types were placed in this  encounter.  Follow-up plan:   No follow-ups on file.      Interventional Therapies  Risk Factors  Considerations:   Drug hypersensitivity: Oral prednisone (hyperactivity) Movement disorder (Parkinson's dyskinesia)  unsteady gait  tremor  Hx. Seizures   Carotid stenosis  orthostatic hypotension  anxiety/depression  GERD      Levoscoliosis (L3-4 apex)     Planned  Pending:      Under consideration:   Therapeutic right L1-2 LESI #3  Diagnostic bilateral L2-3 & L3-4 lumbar facet (  L1-4) MBB #2  Diagnostic x-rays of the cervical spine for further evaluation of his cervicalgia.   Completed:   Diagnostic bilateral L4-5, L5-S1 lumbar facet (L3-S1) MBB x1 (10/13/2022) (70/70/40/40)  Therapeutic right (L2-3, L3-4) lumbar facet RFA x1 (06/25/2022) (50/50/30/40)  Therapeutic left (L2-3, L3-4) lumbar facet RFA x1 (08/04/2022) (50/50/30/30-40)  Diagnostic/therapeutic bilateral L2 + L3 TFESI x1 (05/07/2022) (50/50/25/20)  Diagnostic bilateral L2-3 & L3-4 Facet Blk (L1-4 MBB) x1 (12/23/2021) (75/75/75 x2 weeks)  Diagnostic bilateral lumbar facet (L2-S1) MBB x1 (07/08/2021) (50/50/50/<50)  Therapeutic right L1-2 LESI x2 (01/27/2022) (1st:100/100/50/50) (2nd: 75/75/50/50)  Diagnostic right L2-3 LESI x1 (06/03/2021) (100/100/45/45)  Referral to physical therapy for LB PT as well as a back brace eval. (04/28/2021) (water aerobics)    Therapeutic  Palliative (PRN) options:   None w/o F2F evaluation by provider.   Pharmacotherapy  Drug hypersensitivity: Oral prednisone (hyperactivity)    Pharmacologic Recommendations  For a more complete explanation please see the dictation in the body of my 05/21/2022 note. Oxycodone 5 mg PO TID to QID PRN for pain.              Recent Visits Date Type Provider Dept  10/29/22 Office Visit Delano Metz, MD [facility]  Showing recent visits within past 90 days and meeting all other requirements Future Appointments Date Type  Provider Dept  01/27/23 Appointment Delano Metz, MD [facility]  Showing future appointments within next 90 days and meeting all other requirements  I discussed the assessment and treatment plan with the patient. The patient was provided an opportunity to ask questions and all were answered. The patient agreed with the plan and demonstrated an understanding of the instructions.  Patient advised to call back or seek an in-person evaluation if the symptoms or condition worsens.  Duration of encounter: *** minutes.  Total time on encounter, as per AMA guidelines included both the face-to-face and non-face-to-face time personally spent by the physician and/or other qualified health care professional(s) on the day of the encounter (includes time in activities that require the physician or other qualified health care professional and does not include time in activities normally performed by clinical staff). Physician's time may include the following activities when performed: Preparing to see the patient (e.g., pre-charting review of records, searching for previously ordered imaging, lab work, and nerve conduction tests) Review of prior analgesic pharmacotherapies. Reviewing PMP Interpreting ordered tests (e.g., lab work, imaging, nerve conduction tests) Performing post-procedure evaluations, including interpretation of diagnostic procedures Obtaining and/or reviewing separately obtained history Performing a medically appropriate examination and/or evaluation Counseling and educating the patient/family/caregiver Ordering medications, tests, or procedures Referring and communicating with other health care professionals (when not separately reported) Documenting clinical information in the electronic or other health record Independently interpreting results (not separately reported) and communicating results to the patient/ family/caregiver Care coordination (not separately  reported)  Note by: Oswaldo Done, MD Date: 01/27/2023; Time: 8:58 PM

## 2023-01-27 ENCOUNTER — Encounter: Payer: Self-pay | Admitting: Pain Medicine

## 2023-01-27 ENCOUNTER — Other Ambulatory Visit: Payer: Self-pay

## 2023-01-27 ENCOUNTER — Ambulatory Visit: Payer: Medicare HMO | Attending: Pain Medicine | Admitting: Pain Medicine

## 2023-01-27 VITALS — BP 100/62 | HR 62 | Temp 97.4°F | Resp 16 | Ht 67.0 in | Wt 121.0 lb

## 2023-01-27 DIAGNOSIS — G20B2 Parkinson's disease with dyskinesia, with fluctuations: Secondary | ICD-10-CM | POA: Diagnosis not present

## 2023-01-27 DIAGNOSIS — M5459 Other low back pain: Secondary | ICD-10-CM | POA: Diagnosis not present

## 2023-01-27 DIAGNOSIS — M47816 Spondylosis without myelopathy or radiculopathy, lumbar region: Secondary | ICD-10-CM

## 2023-01-27 DIAGNOSIS — M4310 Spondylolisthesis, site unspecified: Secondary | ICD-10-CM

## 2023-01-27 DIAGNOSIS — G8929 Other chronic pain: Secondary | ICD-10-CM | POA: Diagnosis not present

## 2023-01-27 DIAGNOSIS — M47817 Spondylosis without myelopathy or radiculopathy, lumbosacral region: Secondary | ICD-10-CM | POA: Diagnosis not present

## 2023-01-27 DIAGNOSIS — R937 Abnormal findings on diagnostic imaging of other parts of musculoskeletal system: Secondary | ICD-10-CM | POA: Diagnosis not present

## 2023-01-27 DIAGNOSIS — M4186 Other forms of scoliosis, lumbar region: Secondary | ICD-10-CM | POA: Diagnosis not present

## 2023-01-27 DIAGNOSIS — M545 Low back pain, unspecified: Secondary | ICD-10-CM

## 2023-01-27 NOTE — Patient Outreach (Signed)
Care Management   Visit Note  01/27/2023 Name: Joseph Arroyo MRN: [id number] DOB: Jun 20, 1940  Subjective: EUGENE SHADOWENS is a 82 y.o. year old male who is a primary care patient of Smitty Cords, DO. The Care Management team was consulted for assistance.      Engaged with patient spoke with the family member (POA, [place], [place]).    Goals Addressed             This Visit's Progress    RNCM Care Management  Expected Outcome:  Monitor, Self-Manage and Reduce Symptoms of: Anxiety       Current Barriers:  Care Coordination needs related to social worker support, social work referral pending  in a patient with anxiety Chronic Disease Management support and education needs related to effective management of anxiety  Planned Interventions: Evaluation of current treatment plan related to anxiety and patient's adherence to plan as established by provider. The patient has good days and bad days. Works with the [facility] specialist on a regular basis. He likes to listen to worship music when he is anxious and sometimes eating helps also. He use to do a lot of writing but cannot do that as well due to his PD. He states that his faith keeps him strong and he got a little emotional on the phone. The patient overall is doing fairly well. He has seen the [facility] specialist since last outreach and is doing good with the management of his anxiety level. He has been extra busy lately with vestibular therapy but is doing good. He does not sleep good sometimes, naps during the day. The patients wife is very good support. Reflective listening and support given.  Provided education to patient re: call the office for changes in mood, anxiety, depression, or mental health needs  Reviewed medications with patient and discussed compliance. The patient is compliant with medications. Takes as prescribed Social Work referral for ongoing support and education for anxiety and mental health needs. Ongoing  support and education given from the LCSW.  Discussed plans with patient for ongoing care management follow up and provided patient with direct contact information for care management team Advised patient to discuss changes in his mood, anxiety, depression, or mental health  with provider Screening for signs and symptoms of depression related to chronic disease state  Assessed social determinant of health barriers.  The patients wife denies any new concerns today. His birthday is tomorrow as well as Thanksgiving and they will be getting together with their daughters. He will go and see the pain specialist this afternoon.   Symptom Management: Take medications as prescribed   Attend all scheduled provider appointments Call provider office for new concerns or questions  call the Suicide and Crisis Lifeline: 988 call the [place] National Suicide Prevention Lifeline: 386-029-4814 or TTY: 607-476-9477 TTY 514-760-9655) to talk to a trained counselor call 1-800-273-TALK (toll free, 24 hour hotline) if experiencing a Mental Health or Behavioral Health Crisis   Follow Up Plan: Telephone follow up appointment with care management team member scheduled for: 03-21-2022 at 1030 am       RNCM Care Management Expected Outcome:  Monitor, Self-Manage and Reduce Symptoms of: Arthritis Spine, Back Pain       Current Barriers:  Care Coordination needs related to support and education for effective pain management  in a patient with Chronic pain and arthritis  Chronic Disease Management support and education needs related to effective management of chronic pain, arthritis, and scoliosis  Planned Interventions: Reviewed provider established plan for pain management. Currently the patient is stable with his pain management. He goes to the pain clinic but also is followed closely by pcp. The patients wife knows to call for acute changes. Had a procedure recently on his back and has adequate pain medications. He has  had 30 to 40% relief from back pain with his procedures. The patient had his procedure and had some relief from it. Is currently doing Vestibular PT and after this is completed he will go back to doing regular PT. He is walking in the grocery store and seldom has to use his chair but his walker. Discussed exercises he can do while sitting in a chair or stretches he can do also. Education on pacing activity and being careful. Discussed the cubii device to use to help with leg and muscle strength. His daughter also is working with him on leg strengthening.  Has worked with PT/OT before. Has good support from his wife and family. He continues to stay mobile and active. His wife feels the therapy is helping to keep his legs strong.  Discussed importance of adherence to all scheduled medical appointments. Keeps appointments, saw pcp this week. The patient is stable at this time. Counseled on the importance of reporting any/all new or changed pain symptoms or management strategies to pain management provider; Advised patient to report to care team affect of pain on daily activities; Discussed use of relaxation techniques and/or diversional activities to assist with pain reduction (distraction, imagery, relaxation, massage, acupressure, TENS, heat, and cold application; Reviewed with patient prescribed pharmacological and nonpharmacological pain relief strategies; Advised patient to discuss changes in level or intensity of pain, unresolved pain, questions and concerns with provider; Screening for signs and symptoms of depression related to chronic disease state;  Assessed social determinant of health barriers;  Review of fall prevention and safety concerns  Symptom Management: Take medications as prescribed   Attend all scheduled provider appointments Call provider office for new concerns or questions  call the Suicide and Crisis Lifeline: 988 call the [place] National Suicide Prevention Lifeline:  807-537-3729 or TTY: 915 300 4743 TTY [phone]) to talk to a trained counselor call 1-800-273-TALK (toll free, 24 hour hotline) if experiencing a Mental Health or Behavioral Health Crisis   Follow Up Plan: Telephone follow up appointment with care management team member scheduled for: 03-21-2022 at 1030 am       RNCM Care Management Expected Outcome:  Monitor, Self-Manage and Reduce Symptoms of: Parkinson's       Current Barriers:  Knowledge Deficits related to resources in the area for support in meeting the needs of the patient as his Parkinson's advances and needs change  Care Coordination needs related to support from the CCM team, education needs, and ongoing support for effective management of Parkinson's and other chronic conditions in a patient with Parkinson's Disease  Chronic Disease Management support and education needs related to effective management of PD  Planned Interventions: Evaluation of current treatment plan related to PD and patient's adherence to plan as established by provider. The patient is doing well at this time. 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 His wife states that he is benefiting from the Vestibular PT. The patient has his bed but is not using it a lot. Is thankful he has it.   Advised patient to call the [facility] after talking to the insurance and discussing with her husband if they want the pcp to order the  hospital bed. His wife did check on some beds but states that there are so many different ones to choose from. The patient now has a bariatric bed to use in his home. The wife received a call from the [place] at [facility] and they were abel to deliver a bed to their home to use for the patient. It is a bariatric bed so it has a lot of room and it fits the patient well. The patient will be able to use the bed for as long as he needs to do so. The wife and the patient are thankful for this. Will let the pcp know that an order for a  hospital bed is not needed at this time since the patients needs are currently being met.  Provided education to patient re: the CCM team, resources available, support for the patient, information about myChart: The patients wife wishes for the link to be sent to her email at [email or web address]. This has been sent today. The patients wife also advised the need to provide a DPR for the patient for the future of speaking to the team. The patient gave permission for the [facility] to speak to his wife [name] this call and any subsequent calls.  Reviewed medications with patient and discussed compliance. The patient currently does not have a lot of medications and his wife states she is handling them well. Review of pharm D support if needed.  Collaborated with pcp and LCSW for current expressed needs  regarding PD and advancement of PD and possible future needs. Has spoke with the LCSW and knows how to reach for new questions or concerns Provided patient with PD, falls and safety, and community resource educational materials related to resources to help with effective management of PD. Has had 4 falls over the last few months but none in a month. The patient is working with PT and doing vestibular PT at this time. The patient is working with regular PT now and it is helping keep his legs strong.  Social Work referral for education, support, and long term planning for the patient needs with the advancement of his PD Discussed plans with patient for ongoing care management follow up and provided patient with direct contact information for care management team Advised patient to discuss changes in PD, questions or concerns with provider Screening for signs and symptoms of depression related to chronic disease state  Assessed social determinant of health barriers Encouraged the patients wife to get a notebook dedicated to the patients health needs and write down questions as she or the patient thinks of questions in  regard to DME needs, changes in conditions, medication questions, and other information important to meeting the needs of the patient.  The patient currently receives meals from [employer], has a tub bench and rails in the bathroom, frame for over the commode, and a ramp.  The patient is losing weight. He is drinking protein drinks but continues to lose weight. The recommended diet for patients with PD is plant based; however the patients wife was asking about a carnivore diet that she heard about a lady reversing her sx and sx of PD. Will do some research and see what can be found out about this. Will let the wife know if there are new information found out and will email her with any pertinent information.   Symptom Management: Take medications as prescribed   Attend all scheduled provider appointments Call provider office for new concerns or questions  call the Suicide and Crisis Lifeline: 988 call the [place] National Suicide Prevention Lifeline: 671-065-1342 or TTY: 236-602-7518 TTY (519)826-9454) to talk to a trained counselor call 1-800-273-TALK (toll free, 24 hour hotline) if experiencing a Mental Health or Behavioral Health Crisis   Follow Up Plan: Telephone follow up appointment with care management team member scheduled for: 03-22-2023 at 1030  am           Consent to Services:  Patient was given information about care management services, agreed to services, and gave verbal consent to participate.   Plan: Telephone follow up appointment with care management team member scheduled for: 03-22-2023 at 1030 am  Alto Denver RN, MSN, CCM RN Care Manager  [facility]  Ambulatory Care Management  Direct Number: [phone]

## 2023-01-27 NOTE — Patient Instructions (Signed)
Visit Information  Thank you for taking time to visit with me today. Please don't hesitate to contact me if I can be of assistance to you before our next scheduled telephone appointment.  Following are the goals we discussed today:   Goals Addressed             This Visit's Progress    RNCM Care Management  Expected Outcome:  Monitor, Self-Manage and Reduce Symptoms of: Anxiety       Current Barriers:  Care Coordination needs related to social worker support, social work referral pending  in a patient with anxiety Chronic Disease Management support and education needs related to effective management of anxiety  Planned Interventions: Evaluation of current treatment plan related to anxiety and patient's adherence to plan as established by provider. The patient has good days and bad days. Works with the [facility] specialist on a regular basis. He likes to listen to worship music when he is anxious and sometimes eating helps also. He use to do a lot of writing but cannot do that as well due to his PD. He states that his faith keeps him strong and he got a little emotional on the phone. The patient overall is doing fairly well. He has seen the [facility] specialist since last outreach and is doing good with the management of his anxiety level. He has been extra busy lately with vestibular therapy but is doing good. He does not sleep good sometimes, naps during the day. The patients wife is very good support. Reflective listening and support given.  Provided education to patient re: call the office for changes in mood, anxiety, depression, or mental health needs  Reviewed medications with patient and discussed compliance. The patient is compliant with medications. Takes as prescribed Social Work referral for ongoing support and education for anxiety and mental health needs. Ongoing support and education given from the LCSW.  Discussed plans with patient for ongoing care management follow up and provided patient  with direct contact information for care management team Advised patient to discuss changes in his mood, anxiety, depression, or mental health  with provider Screening for signs and symptoms of depression related to chronic disease state  Assessed social determinant of health barriers.  The patients wife denies any new concerns today. His birthday is tomorrow as well as Thanksgiving and they will be getting together with their daughters. He will go and see the pain specialist this afternoon.   Symptom Management: Take medications as prescribed   Attend all scheduled provider appointments Call provider office for new concerns or questions  call the Suicide and Crisis Lifeline: 988 call the [place] National Suicide Prevention Lifeline: (731)431-5105 or TTY: (907) 072-8609 TTY (907)499-3016) to talk to a trained counselor call 1-800-273-TALK (toll free, 24 hour hotline) if experiencing a Mental Health or Behavioral Health Crisis   Follow Up Plan: Telephone follow up appointment with care management team member scheduled for: 03-21-2022 at 1030 am       RNCM Care Management Expected Outcome:  Monitor, Self-Manage and Reduce Symptoms of: Arthritis Spine, Back Pain       Current Barriers:  Care Coordination needs related to support and education for effective pain management  in a patient with Chronic pain and arthritis  Chronic Disease Management support and education needs related to effective management of chronic pain, arthritis, and scoliosis   Planned Interventions: Reviewed provider established plan for pain management. Currently the patient is stable with his pain management. He goes to the pain  clinic but also is followed closely by pcp. The patients wife knows to call for acute changes. Had a procedure recently on his back and has adequate pain medications. He has had 30 to 40% relief from back pain with his procedures. The patient had his procedure and had some relief from it. Is currently  doing Vestibular PT and after this is completed he will go back to doing regular PT. He is walking in the grocery store and seldom has to use his chair but his walker. Discussed exercises he can do while sitting in a chair or stretches he can do also. Education on pacing activity and being careful. Discussed the cubii device to use to help with leg and muscle strength. His daughter also is working with him on leg strengthening.  Has worked with PT/OT before. Has good support from his wife and family. He continues to stay mobile and active. His wife feels the therapy is helping to keep his legs strong.  Discussed importance of adherence to all scheduled medical appointments. Keeps appointments, saw pcp this week. The patient is stable at this time. Counseled on the importance of reporting any/all new or changed pain symptoms or management strategies to pain management provider; Advised patient to report to care team affect of pain on daily activities; Discussed use of relaxation techniques and/or diversional activities to assist with pain reduction (distraction, imagery, relaxation, massage, acupressure, TENS, heat, and cold application; Reviewed with patient prescribed pharmacological and nonpharmacological pain relief strategies; Advised patient to discuss changes in level or intensity of pain, unresolved pain, questions and concerns with provider; Screening for signs and symptoms of depression related to chronic disease state;  Assessed social determinant of health barriers;  Review of fall prevention and safety concerns  Symptom Management: Take medications as prescribed   Attend all scheduled provider appointments Call provider office for new concerns or questions  call the Suicide and Crisis Lifeline: 988 call the [place] National Suicide Prevention Lifeline: 769-155-4452 or TTY: 7792166041 TTY 256-639-7799) to talk to a trained counselor call 1-800-273-TALK (toll free, 24 hour hotline) if  experiencing a Mental Health or Behavioral Health Crisis   Follow Up Plan: Telephone follow up appointment with care management team member scheduled for: 03-21-2022 at 1030 am       RNCM Care Management Expected Outcome:  Monitor, Self-Manage and Reduce Symptoms of: Parkinson's       Current Barriers:  Knowledge Deficits related to resources in the area for support in meeting the needs of the patient as his Parkinson's advances and needs change  Care Coordination needs related to support from the CCM team, education needs, and ongoing support for effective management of Parkinson's and other chronic conditions in a patient with Parkinson's Disease  Chronic Disease Management support and education needs related to effective management of PD  Planned Interventions: Evaluation of current treatment plan related to PD and patient's adherence to plan as established by provider. The patient is doing well at this time. 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 His wife states that he is benefiting from the Vestibular PT. The patient has his bed but is not using it a lot. Is thankful he has it.   Advised patient to call the [facility] after talking to the insurance and discussing with her husband if they want the pcp to order the hospital bed. His wife did check on some beds but states that there are so many different ones to choose from. The patient  now has a bariatric bed to use in his home. The wife received a call from the [place] at [facility] and they were abel to deliver a bed to their home to use for the patient. It is a bariatric bed so it has a lot of room and it fits the patient well. The patient will be able to use the bed for as long as he needs to do so. The wife and the patient are thankful for this. Will let the pcp know that an order for a hospital bed is not needed at this time since the patients needs are currently being met.  Provided education to patient re: the CCM team,  resources available, support for the patient, information about myChart: The patients wife wishes for the link to be sent to her email at [email or web address]. This has been sent today. The patients wife also advised the need to provide a DPR for the patient for the future of speaking to the team. The patient gave permission for the [facility] to speak to his wife [name] this call and any subsequent calls.  Reviewed medications with patient and discussed compliance. The patient currently does not have a lot of medications and his wife states she is handling them well. Review of pharm D support if needed.  Collaborated with pcp and LCSW for current expressed needs  regarding PD and advancement of PD and possible future needs. Has spoke with the LCSW and knows how to reach for new questions or concerns Provided patient with PD, falls and safety, and community resource educational materials related to resources to help with effective management of PD. Has had 4 falls over the last few months but none in a month. The patient is working with PT and doing vestibular PT at this time. The patient is working with regular PT now and it is helping keep his legs strong.  Social Work referral for education, support, and long term planning for the patient needs with the advancement of his PD Discussed plans with patient for ongoing care management follow up and provided patient with direct contact information for care management team Advised patient to discuss changes in PD, questions or concerns with provider Screening for signs and symptoms of depression related to chronic disease state  Assessed social determinant of health barriers Encouraged the patients wife to get a notebook dedicated to the patients health needs and write down questions as she or the patient thinks of questions in regard to DME needs, changes in conditions, medication questions, and other information important to meeting the needs of the patient.  The  patient currently receives meals from [employer], has a tub bench and rails in the bathroom, frame for over the commode, and a ramp.  The patient is losing weight. He is drinking protein drinks but continues to lose weight. The recommended diet for patients with PD is plant based; however the patients wife was asking about a carnivore diet that she heard about a lady reversing her sx and sx of PD. Will do some research and see what can be found out about this. Will let the wife know if there are new information found out and will email her with any pertinent information.   Symptom Management: Take medications as prescribed   Attend all scheduled provider appointments Call provider office for new concerns or questions  call the Suicide and Crisis Lifeline: 988 call the [place] National Suicide Prevention Lifeline: 906-011-9824 or TTY: 5391475365 TTY 410-885-7550) to talk to  a trained counselor call 1-800-273-TALK (toll free, 24 hour hotline) if experiencing a Mental Health or Behavioral Health Crisis   Follow Up Plan: Telephone follow up appointment with care management team member scheduled for: 03-22-2023 at 1030  am           Our next appointment is by telephone on 03-22-2023 at 1030 am  Please call the care guide team at [phone] if you need to cancel or reschedule your appointment.   If you are experiencing a Mental Health or Behavioral Health Crisis or need someone to talk to, please call the Suicide and Crisis Lifeline: 988 call the [place] National Suicide Prevention Lifeline: 870-147-9108 or TTY: 937-568-1758 TTY 859-053-1664) to talk to a trained counselor call 1-800-273-TALK (toll free, 24 hour hotline)   Patient verbalizes understanding of instructions and care plan provided today and agrees to view in MyChart. Active MyChart status and patient understanding of how to access instructions and care plan via MyChart confirmed with patient.       Alto Denver RN, MSN,  CCM RN Care Manager  [facility]  Ambulatory Care Management  Direct Number: [phone]

## 2023-01-27 NOTE — Patient Instructions (Signed)

## 2023-01-27 NOTE — Progress Notes (Signed)
Safety precautions to be maintained throughout the outpatient stay will include: orient to surroundings, keep bed in low position, maintain call bell within reach at all times, provide assistance with transfer out of bed and ambulation.  

## 2023-01-29 ENCOUNTER — Other Ambulatory Visit: Payer: Medicare HMO

## 2023-02-02 ENCOUNTER — Ambulatory Visit: Payer: Medicare HMO

## 2023-02-02 ENCOUNTER — Ambulatory Visit: Payer: Medicare HMO | Admitting: Physical Therapy

## 2023-02-03 ENCOUNTER — Telehealth: Payer: Self-pay

## 2023-02-03 NOTE — Telephone Encounter (Signed)
Only 2 levels will be performed for the 64493 and 54098 procedure. The patient had 70% relief from the procedure which dropped to 40 % over time. Hence the need to perform the second medial branch nerve block with a possibility of repeating a Radiofrequency ablation in the near future.

## 2023-02-04 ENCOUNTER — Ambulatory Visit: Payer: Medicare HMO

## 2023-02-04 ENCOUNTER — Telehealth: Payer: Self-pay

## 2023-02-04 ENCOUNTER — Ambulatory Visit: Payer: Medicare HMO | Attending: Physician Assistant

## 2023-02-04 DIAGNOSIS — R42 Dizziness and giddiness: Secondary | ICD-10-CM | POA: Diagnosis not present

## 2023-02-04 DIAGNOSIS — R278 Other lack of coordination: Secondary | ICD-10-CM

## 2023-02-04 DIAGNOSIS — M6281 Muscle weakness (generalized): Secondary | ICD-10-CM

## 2023-02-04 DIAGNOSIS — R262 Difficulty in walking, not elsewhere classified: Secondary | ICD-10-CM | POA: Diagnosis not present

## 2023-02-04 DIAGNOSIS — G20C Parkinsonism, unspecified: Secondary | ICD-10-CM | POA: Insufficient documentation

## 2023-02-04 DIAGNOSIS — M5459 Other low back pain: Secondary | ICD-10-CM | POA: Diagnosis not present

## 2023-02-04 DIAGNOSIS — R2681 Unsteadiness on feet: Secondary | ICD-10-CM | POA: Diagnosis not present

## 2023-02-04 NOTE — Therapy (Signed)
OUTPATIENT PHYSICAL THERAPY NEURO TREATMENT Patient Name: Joseph Arroyo MRN: [id number] DOB:[birth year], 82 y.o., male Today's Date: 02/04/2023  END OF SESSION:  PT End of Session - 02/04/23 1059     Visit Number 25    Number of Visits 33    Date for PT Re-Evaluation 02/23/23    Authorization Type [employer]    Progress Note Due on Visit 10    PT Start Time 1102    PT Stop Time 1144    PT Time Calculation (min) 42 min    Equipment Utilized During Treatment Gait belt    Activity Tolerance Patient tolerated treatment well;Patient limited by pain    Behavior During Therapy WFL for tasks assessed/performed                        Past Medical History:  Diagnosis Date   Allergic rhinitis due to pollen 11/21/2007   Brachial neuritis or radiculitis NOS    Cervicalgia    Concussion    age 44 - s/p accident   Costal chondritis    Depression    Essential and other specified forms of tremor    GERD (gastroesophageal reflux disease)    in past   Lumbago    Occlusion and stenosis of carotid artery without mention of cerebral infarction    Seizures (HCC)    age 73 - after concussion   Past Surgical History:  Procedure Laterality Date   CATARACT EXTRACTION Right 07/18/14   CATARACT EXTRACTION W/PHACO Left 08/01/2014   Procedure: CATARACT EXTRACTION PHACO AND INTRAOCULAR LENS PLACEMENT (IOC);  Surgeon: Lockie Mola, MD;  Location: [facility];  Service: Ophthalmology;  Laterality: Left;  IVA TOPICAL   COLONOSCOPY  2008   OTHER SURGICAL HISTORY  2002   ear surgery   STAPEDES SURGERY Right 2002   Patient Active Problem List   Diagnosis Date Noted   Dizziness 10/02/2022   Lumbar facet joint pain 06/25/2022   Osteoarthritis of facet joint of lumbar spine 06/25/2022   Lumbosacral facet joint hypertrophy 06/25/2022   Parkinson's disease with dyskinesia (HCC) 01/27/2022   Dysphagia, pharyngeal phase 09/17/2021   Cervicalgia 08/05/2021    Spondylosis without myelopathy or radiculopathy, lumbosacral region 06/24/2021   Lumbar central spinal stenosis, w/o neurogenic claudication (L4-5) 06/03/2021   Lumbar lateral recess stenosis (Bilateral: L2-3, L4-5) (Right: L3-4) 06/03/2021   Lumbosacral foraminal stenosis (Bilateral: L2-3, L3-4) (Left: L5-S1) 06/03/2021   Lumbar nerve root impingement (Right: L3 at L2-3 & L3-4) 06/03/2021   Ligamentum flavum hypertrophy (L3-4, L4-5) 06/03/2021   Abnormal MRI, lumbar spine (04/23/2021) 04/24/2021   Long term prescription benzodiazepine use (alprazolam) (Xanax) 03/24/2021   Grade 1 Retrolisthesis of L2/L3 (5 mm) and L3/L4 (3 mm) 03/24/2021   Levoscoliosis of lumbar spine (L3-4 apex) 03/24/2021   DDD (degenerative disc disease), lumbosacral 03/24/2021   Lumbosacral facet arthropathy (Left: L3-4, L4-5, and L5-S1) 03/24/2021   Tricompartment osteoarthritis of knee (Left) 03/24/2021   Baker cyst (Left) 03/24/2021   Chronic low back pain (1ry area of Pain) (Bilateral) (R>L) w/o sciatica 03/24/2021   Lumbar facet syndrome (Bilateral) 03/24/2021   Chronic lower extremity pain (2ry area of Pain) (Bilateral) (L>R) 03/24/2021   Lumbosacral radiculitis/sensory radiculopathy at L2 (Bilateral) 03/24/2021   Lumbosacral radiculitis/sensory radiculopathy at L3 (Bilateral) 03/24/2021   Chronic pain syndrome 03/23/2021   Pharmacologic therapy 03/23/2021   Disorder of skeletal system 03/23/2021   Problems influencing health status 03/23/2021   PAD (peripheral artery disease) (HCC) 10/01/2020  GAD (generalized anxiety disorder) 01/13/2018   MDD (major depressive disorder) 01/13/2018   Umbilical hernia 09/04/2017   Chronic knee pain (Left) 08/11/2017   Derangement of medial meniscus, posterior horn (Left) 08/11/2017   Vitamin D insufficiency 03/05/2016   BPH without obstruction/lower urinary tract symptoms 07/08/2015   Chronic fatigue 10/23/2014   Major depression, recurrent, full remission (HCC)  07/26/2013   Unsteady gait 07/26/2013   Orthostatic hypotension 07/26/2013   Depression 07/26/2013   Anxiety 06/23/2013   Chronic anxiety 06/23/2013   Tremor 05/18/2013   Bilateral carotid artery stenosis 08/30/2012    PCP: Smitty Cords, DO  REFERRING PROVIDER:   Mecum, Oswaldo Conroy, PA-C    REFERRING DIAG: R42 (ICD-10-CM) - Dizziness   THERAPY DIAG:  Unsteadiness on feet  Muscle weakness (generalized)  Other low back pain  Difficulty in walking, not elsewhere classified  Other lack of coordination  ONSET DATE: August 1st 2024  Rationale for Evaluation and Treatment: Rehabilitation  SUBJECTIVE:   SUBJECTIVE STATEMENT  Pt did not come to PT Tuesday because he fell backwards after he stood up from his shower chair. Reports he tried to turn and felt lightheaded at the time. He fell into his bench and hit his R side, reports hurt his ribs, he did not hit his head. He did not seek care after the fall. He reports it does not hurt to breathe in. Low back has been more sore. His side is feeling a little better today. He has been using heat since the fall. Pt reports he felt more unsteady than usual  leading up to the fall. Pt pain level is a 5/10  Pt accompanied by: significant other   PERTINENT HISTORY:  The pt is a pleasant 82 yo male referred to PT for dizziness. Pt known to clinic, previously seen for falls in the setting of PD.  Pt spouse present for eval and reports pt was seen by neurologist recently at [facility] following onset of dizziness about two-three weeks ago. He reports they think he has BPPV.  PT spouse reports he had blood work done at this visit which "didn't show anything" and was normal. Pt unsure if his symptoms come on when he is still at rest. He describes his symptoms as spinning. This can be triggered when he lies on his back and also when he sits up from supine. Spinning lasts for minutes. He reports no nausea.  Pt sometimes sees double when he looks to  the side and this has been going on for weeks. Denies any one sided weakness or other changes. He has fallen 4 times in past 3 months. Pt spouse reports pt had a fall about a week prior to dizziness onset. Pt reports no neck pain, has hx of LBP. His spouse reports decline in his mobility since this started. Pt uses RW at baseline for ambulation. Pt now being seen primarily for PD impairments. Pt PMH significant for Parkinson's disease, concussion, cervicalgia, depression, seizures, chronic L knee pain, chronic anxiety, DDD, PAD, chronic pain syndrome, chronic low back pain, please refer to chart for full, extensive, PMH. PAIN:  Are you having pain? No Back pain hurting prior, took 3 ibuprophen.   PRECAUTIONS: Fall   WEIGHT BEARING RESTRICTIONS: No  FALLS: Has patient fallen in last 6 months? Yes. Number of falls 4  LIVING ENVIRONMENT: via chart and confirmed by pt in session Lives with: lives with their spouse Lives in: House/apartment Stairs: 4 Has following equipment at home: Dan Humphreys - 2  wheeled  PLOF:  needs assistance, pt with PD that affected his mobility/ADLs  PATIENT GOALS: get rid of dizziness  OBJECTIVE:     TREATMENT:                                                                                                   DATE: 02/04/23  Gait belt donned for pt safety with upright activities, heat applied to low back while pt performing seated interventions (reports heat feels good, no adverse reaction to heat)   TE:  Assessment of R side/trunk/ribs due to recent fall and reports of rib pain. No bruising, skin appears WNL. Pt very TTP to light touch around 6th-7th rib, describes as a "sharp jab"  Gait with RW x 296 ft for endurance/LE strength. Pt with slight increase in festinating gait today, but maintains good proximity to RW throughout.   Seated- LAQ  1x15 each LE  - pain limited in low back March 15x each LE -pain limited in low back Seated adductor isometrics with pball  2x15 with 3 sec hold/rep  Seated pball addutor hip IR/ER attempts 15 each but stops after 8 reps each movement/side due to low back pain   NMR: Ambulating/navigating around cones with RW x multiple reps. Only one instance of knocking into cone. Maintains good proximity to RW. Increase in festinating gait      PATIENT EDUCATION: Education details: exercise technique Person education: patient  HOME EXERCISE PROGRAM: Continue as previously given: Access Code: [email or web address] URL: [email or web address] Date: 01/12/2023 Prepared by: Temple Pacini  Exercises - Seated Shoulder Flexion Full Range Single Arm  - 1 x daily - 7 x weekly - 2 sets - 10 reps  Access Code: [email or web address] URL: [email or web address] Date: 12/08/2022 Prepared by: Temple Pacini  Exercises - Seated March  - 1 x daily - 5-6 x weekly - 4 sets - 10 reps - Seated Long Arc Quad  - 1 x daily - 5-6 x weekly - 2 sets - 15 reps - Seated Heel Toe Raises  - 1 x daily - 5-6 x weekly - 2 sets - 20 reps   Access Code: [email or web address] URL: [email or web address] Date: 12/24/2022 Prepared by: Tomasa Hose  Exercises - Standing Lumbar Extension at Wall - Forearms  - 1 x daily - 7 x weekly - 3 sets - 10 reps - Prone Press Up On Elbows  - 1 x daily - 7 x weekly - 3 sets - 10 reps - Seated Lumbar Extension  - 1 x daily - 7 x weekly - 3 sets - 10 reps  GOALS:   Goals reviewed with patient? Yes   SHORT TERM GOALS: Target date: 03/18/2023   Patient will be independent in home exercise program to improve dizziness, balance and mobility for better safety and functional independence with ADLs. Baseline: to be initiated future visit/as needed 11/12:  Goal status: IN PROGRESS    LONG TERM GOALS: Target date: 04/29/2023    Patient will increase FOTO score to equal to or greater than 53    to demonstrate statistically significant improvement in mobility and quality of life.  Baseline: 44; 01/12/23: 52  Goal  status: PARTIALLY MET   2.  Patient will reduce dizziness handicap inventory score to <50, for less dizziness with ADLs and increased safety with home and work tasks.  Baseline: deferred, will complete as pt able: 10/10: 48, still has difficulty with bed mobility  Goal status: MET  3.  The pt will report at least a 60% decrease in positional or movement triggered dizziness symptoms in order to indicate decreased fall risk and increased balance/safety with mobility. Baseline: pt presents with decreased mobility due to dizziness, dizziness/vertigo triggered by particular positional; changes; 10/10: 60-70%  Goal status: MET  4.  Patient will reduce timed up and go to <11 seconds to reduce fall risk and demonstrate improved transfer/gait ability. Baseline: 9/5: 18 sec; 10/10: 16 seconds seconds; 11/12: 17.5 sec with RW  Goal status: IN PROGRESS   5.Patient (> 71 years old) will complete five times sit to stand test in < 15 seconds indicating an increased LE strength and improved balance. Baseline: 12/01/22: 26 sec; 11/12: 22.5 sec hands-free  Goal status: IN PROGRESS   6. Patient will increase 10 meter walk test to >1.58m/s as to improve gait speed for better community ambulation and to reduce fall risk. Baseline: 12/01/22: 0.6 m/s with RW; 11/12:  0.95 m/s with RW  Goal status: PARTIALLY MET   ASSESSMENT:  CLINICAL IMPRESSION: Majority of interventions limited due to increased LBP today following report of fall at home on Tuesday, large-amplitude training avoided today because pt reports rib pain from fall. Pt also very TTP R 6th-7th rib region, no pain on inspiration. PT recommends follow-up with his physician if this does not improve or pt develops other symptoms. No bruising noted. Pt will continue to benefit from skilled therapy to address remaining deficits in order to improve overall QoL and return to PLOF.      OBJECTIVE IMPAIRMENTS: Abnormal gait, decreased activity tolerance,  decreased balance, decreased coordination, decreased mobility, difficulty walking, decreased strength, dizziness, impaired vision/preception, improper body mechanics, and postural dysfunction.   ACTIVITY LIMITATIONS: lifting, bending, squatting, stairs, transfers, bed mobility, toileting, dressing, hygiene/grooming, and locomotion level  PARTICIPATION LIMITATIONS: meal prep, cleaning, laundry, shopping, community activity, and yard work  PERSONAL FACTORS: Age, Fitness, and 3+ comorbidities: Pt PMH significant for Parkinson's disease, concussion, cervicalgia, depression, seizures, chronic L knee pain, chronic anxiety, DDD, PAD, chronic pain syndrome, chronic low back pain, please refer to chart for full, extensive, PMH.  are also affecting patient's functional outcome.   REHAB POTENTIAL: Good  CLINICAL DECISION MAKING: Evolving/moderate complexity  EVALUATION COMPLEXITY: Moderate   PLAN:  PT FREQUENCY: 1-2x/week  PT DURATION: 12 weeks  PLANNED INTERVENTIONS: Therapeutic exercises, Therapeutic activity, Neuromuscular re-education, Balance training, Gait training, Patient/Family education, Self Care, Joint mobilization, Stair training, Vestibular training, Canalith repositioning, Visual/preceptual remediation/compensation, DME instructions, Electrical stimulation, Wheelchair mobility training, Spinal mobilization, Cryotherapy, Moist heat, Splintting, Taping, Traction, Manual therapy, and Re-evaluation  PLAN FOR NEXT SESSION: attempt standing/supported PD interventions, balance    Temple Pacini PT, DPT  Physical Therapist - [facility]  [facility]  02/04/23, 5:15 PM

## 2023-02-04 NOTE — Telephone Encounter (Signed)
Insurance Treatment Denial Note  Date order was entered:  Order entered by: Delano Metz, MD Requested treatment: lumbar facets Reason for denial:  patient didn't get enough % of relief from first one Recommended for approval:  patient can appeal    why did we deny your request? We denied the medical services/items listed above because:  Your doctor told us that you have pain coming from a joint in your low back. A shot to treat this  was requested. We cannot approve this request. The notes sent to [place] do not show: You need to have responded well to the last shot with either of the following: -At least 80 percent less pain after an injection to find the source of pain. This relief should have  lasted for the amount of time expected with the drug that was used. -If the prior injection was used to treat your pain, you must have had at least 50 percent less pain  for three months or at least 50 perent better function. We told your doctor about this. Please talk to your doctor if you have questions

## 2023-02-04 NOTE — Therapy (Signed)
OUTPATIENT OCCUPATIONAL THERAPY NEURO TREATMENT NOTE  Patient Name: Joseph Arroyo MRN: [id number] DOB:Dec 02, 1940, 82 y.o., male Today's Date: 02/04/2023  PCP: Dr. Saralyn Pilar REFERRING PROVIDER: PCP  END OF SESSION:  OT End of Session - 02/04/23 1224     Visit Number 14    Number of Visits 24    Date for OT Re-Evaluation 03/04/23    Progress Note Due on Visit 10    OT Start Time 1145    OT Stop Time 1230    OT Time Calculation (min) 45 min    Equipment Utilized During Treatment RW    Activity Tolerance Patient tolerated treatment well    Behavior During Therapy [facility] for tasks assessed/performed            Past Medical History:  Diagnosis Date   Allergic rhinitis due to pollen 11/21/2007   Brachial neuritis or radiculitis NOS    Cervicalgia    Concussion    age 73 - s/p accident   Costal chondritis    Depression    Essential and other specified forms of tremor    GERD (gastroesophageal reflux disease)    in past   Lumbago    Occlusion and stenosis of carotid artery without mention of cerebral infarction    Seizures [facility])    age 10 - after concussion   Past Surgical History:  Procedure Laterality Date   CATARACT EXTRACTION Right 07/18/14   CATARACT EXTRACTION W/PHACO Left 08/01/2014   Procedure: CATARACT EXTRACTION PHACO AND INTRAOCULAR LENS PLACEMENT (IOC);  Surgeon: Lockie Mola, MD;  Location: [facility];  Service: Ophthalmology;  Laterality: Left;  IVA TOPICAL   COLONOSCOPY  2008   OTHER SURGICAL HISTORY  2002   ear surgery   STAPEDES SURGERY Right 2002   Patient Active Problem List   Diagnosis Date Noted   Dizziness 10/02/2022   Lumbar facet joint pain 06/25/2022   Osteoarthritis of facet joint of lumbar spine 06/25/2022   Lumbosacral facet joint hypertrophy 06/25/2022   Parkinson's disease with dyskinesia (HCC) 01/27/2022   Dysphagia, pharyngeal phase 09/17/2021   Cervicalgia 08/05/2021   Spondylosis without myelopathy  or radiculopathy, lumbosacral region 06/24/2021   Lumbar central spinal stenosis, w/o neurogenic claudication (L4-5) 06/03/2021   Lumbar lateral recess stenosis (Bilateral: L2-3, L4-5) (Right: L3-4) 06/03/2021   Lumbosacral foraminal stenosis (Bilateral: L2-3, L3-4) (Left: L5-S1) 06/03/2021   Lumbar nerve root impingement (Right: L3 at L2-3 & L3-4) 06/03/2021   Ligamentum flavum hypertrophy (L3-4, L4-5) 06/03/2021   Abnormal MRI, lumbar spine (04/23/2021) 04/24/2021   Long term prescription benzodiazepine use (alprazolam) (Xanax) 03/24/2021   Grade 1 Retrolisthesis of L2/L3 (5 mm) and L3/L4 (3 mm) 03/24/2021   Levoscoliosis of lumbar spine (L3-4 apex) 03/24/2021   DDD (degenerative disc disease), lumbosacral 03/24/2021   Lumbosacral facet arthropathy (Left: L3-4, L4-5, and L5-S1) 03/24/2021   Tricompartment osteoarthritis of knee (Left) 03/24/2021   Baker cyst (Left) 03/24/2021   Chronic low back pain (1ry area of Pain) (Bilateral) (R>L) w/o sciatica 03/24/2021   Lumbar facet syndrome (Bilateral) 03/24/2021   Chronic lower extremity pain (2ry area of Pain) (Bilateral) (L>R) 03/24/2021   Lumbosacral radiculitis/sensory radiculopathy at L2 (Bilateral) 03/24/2021   Lumbosacral radiculitis/sensory radiculopathy at L3 (Bilateral) 03/24/2021   Chronic pain syndrome 03/23/2021   Pharmacologic therapy 03/23/2021   Disorder of skeletal system 03/23/2021   Problems influencing health status 03/23/2021   PAD (peripheral artery disease) (HCC) 10/01/2020   GAD (generalized anxiety disorder) 01/13/2018   MDD (major depressive disorder)  01/13/2018   Umbilical hernia 09/04/2017   Chronic knee pain (Left) 08/11/2017   Derangement of medial meniscus, posterior horn (Left) 08/11/2017   Vitamin D insufficiency 03/05/2016   BPH without obstruction/lower urinary tract symptoms 07/08/2015   Chronic fatigue 10/23/2014   Major depression, recurrent, full remission (HCC) 07/26/2013   Unsteady gait 07/26/2013    Orthostatic hypotension 07/26/2013   Depression 07/26/2013   Anxiety 06/23/2013   Chronic anxiety 06/23/2013   Tremor 05/18/2013   Bilateral carotid artery stenosis 08/30/2012   ONSET DATE: 2015  REFERRING DIAG: Parkinson's Disease  THERAPY DIAG:  Muscle weakness (generalized)  Other lack of coordination  Atypical parkinsonism (HCC)  Rationale for Evaluation and Treatment: Rehabilitation  SUBJECTIVE:  SUBJECTIVE STATEMENT: Pt had a fall this week after losing his balance while getting up from his transfer tub bench.  Spouse reports that there is a grab bar for pt to hold to.  Pt fell and hit his R side and now has rib pain.  Pt just finished PT session and PT verbalized pt with no SOB, just point tenderness to the R rib cage, and no bruising.  Pt/therapists will continue to monitor. Pt accompanied by: significant other, spouse, [name]  PERTINENT HISTORY: Hx of Parkinson's disease (HCC); Chronic knee pain (Left); Derangement of medial meniscus, posterior horn (Left); PAD (peripheral artery disease) (HCC); Chronic pain syndrome; DDD (degenerative disc disease) lumbosacral; osteoarthritis of knee (Left); Chronic low back pain, Lumbar facet syndrome (Bilateral); Chronic lower extremity pain.   PRECAUTIONS: Fall  WEIGHT BEARING RESTRICTIONS: No  PAIN: 02/04/23: 6/10 pain low back and R rib cage Are you having pain? Yes: NPRS scale: 8-9/10 Pain location: low back  Pain description: aching, sometimes sharp  Aggravating factors: activity  Relieving factors: lying down, heat, ice pack  FALLS: Has patient fallen in last 6 months? Yes. Number of falls 4  LIVING ENVIRONMENT: Lives with: lives with their spouse Lives in: 1 level home Stairs: 4 steps, a landing, then 3 more steps with bilat rails.  Has following equipment at home: Dan Humphreys - 2 wheeled, Shower bench, bed side commode, and Grab bars   PLOF: Needs assistance with ADLs.  Pt performs basic self care with set up assist, extra  time to manage clothing fasteners, occasional min A on days with worsening Parkinson's symptoms or back pain.    PATIENT GOALS: Pt wants to regain some strength and coordination skills in bilat hands.   OBJECTIVE:  Note: Objective measures were completed at Evaluation unless otherwise noted.  HAND DOMINANCE: Right  ADLs: Overall ADLs: Extensive time required; occasional min A from spouse Transfers/ambulation related to ADLs: supv-modified indep with RW Eating: difficulty cutting food, struggles with loading food on a fork (uses built up foam handles at times); spouse cuts steak Grooming: electric toothbrush and standard toothbrush, but mostly uses electric; increased difficulty with brushing teeth and shaving (has electric trimmer and regular razor) UB Dressing: magnetic buttons on some shirts; more difficult to button shirts, occasional help with donning a shirt (min A)  LB Dressing: difficulty tying shoe laces, increased time; uses long shoe horn, has a hip kit; occasional assist to don socks  Toileting: modified indep/extra time  Bathing: able to take a shower with supv with tub bench Tub Shower transfers: transfer tub bench Equipment:  Pt does have hip kit and he regularly uses the long shoe horn.  Spouse reports pt may need some review with the other tools.   IADLs: Shopping: Spouse manages Light housekeeping: Spouse manages  Meal Prep: Spouse manages Community mobility: Uses RW and requires frequent sitting breaks d/t back pain Medication management: modified indep Financial management: Extra time for writing checks, with occasional assist from spouse on days when tremors are worse Handwriting: 90% legible, though pt reports this is an exceptional day today, and pt reports he's recently had to rip of checks and have spouse take over d/t poor legibility.  MOBILITY STATUS: Hx of falls; spouse reports pt to have had 4 falls in the last 6 mo  POSTURE COMMENTS:  rounded shoulders,  increased thoracic kyphosis, and hx of scoliosis , L shoulder rests significantly lower than R Sitting balance: Supports self with one extremity  ACTIVITY TOLERANCE: Activity tolerance: limited by low back pain   FUNCTIONAL OUTCOME MEASURES: FOTO: TBD  (PT managing FOTO)  UPPER EXTREMITY ROM:  BUEs WFL  UPPER EXTREMITY MMT:     MMT Right eval Left eval  Shoulder flexion 5 5  Shoulder abduction 5 5  Shoulder adduction    Shoulder extension    Shoulder internal rotation 4 4  Shoulder external rotation 4 4  Middle trapezius    Lower trapezius    Elbow flexion    Elbow extension    Wrist flexion    Wrist extension    Wrist ulnar deviation    Wrist radial deviation    Wrist pronation    Wrist supination    (Blank rows = not tested)  HAND FUNCTION: Eval: Grip strength: Right: 30 lbs; Left: 45 lbs, Lateral pinch: Right: 12 lbs, Left: 5 lbs, and 3 point pinch: Right: 10 lbs, Left: 6 lbs 01/14/23: Grip strength: Right: 38 lbs; Left: 39 lbs, Lateral pinch: Right: 11 lbs, Left: 5 lbs, and 3 point pinch: Right: 9 lbs, Left: 5 lbs  COORDINATION: Eval: 9 Hole Peg test: Right: 33 sec; Left: 41 sec 01/14/23: 9 Hole Peg test: Right: 28 sec; Left: 29 sec  SENSATION: Not tested  EDEMA: No visible edema  MUSCLE TONE: RUE: Within functional limits and LUE: Within functional limits  COGNITION: Overall cognitive status: Within functional limits for tasks assessed; spouse reports some impulsivity/poor safety awareness contributing to falls at home  VISION: No recent changes; wears glasses for reading Visual history: macular degeneration, hx of cataract removal both eyes  PERCEPTION: Not tested  PRAXIS: Impaired: Motor planning  TODAY'S TREATMENT:                                                                                                                              DATE: 02/04/23 Moist heat applied to low back with support of lumbar pillow; a second pillow wedged between R  side and arm rest of chair for midline position and back pain management throughout session.  Neuro re-ed: Facilitated R/L hand FMC/dexterity skills working to place grooved pegs into pegboard.  Practiced storing 1 peg in hand while placing another into pegboard, then practiced removing pegs using various types of tweezers to challenge  sustained prehension.  Self Care: Practiced handwriting skills with use of high lighted lines as an external cue for letter height, and using 1 finger breadth for word spacing.  Pt with good carryover of forearms supported on table top to maximize distal stability.   PATIENT EDUCATION: Education details: FMC/dexterity skills Person educated: Patient and Spouse Education method: Explanation, demo Education comprehension: verbalized understanding, demonstrated understanding with vc and tactile cues  HOME EXERCISE PROGRAM: Pt instructed to use his theraputty or his hand strengthener (spring based), [facility] exercises (handout issued), red theraband for bilat shoulder strengthening (ER/IR)  GOALS: Goals reviewed with patient? Yes  SHORT TERM GOALS: Target date: 01/21/23  Pt will perform HEP with supv for increasing strength and coordination in B hands.  Baseline: Eval: Will review previous HEP and add to as appropriate; 01/14/23: Pt using theraputty; added theraband today to target ER/IR strengthening at bilat shoulders Goal status: in progress   2.  Pt will implement fall prevention strategies with min vc from spouse at least 50% of the time.  Baseline: Spouse reports pt with 4 falls over the last 6 months, noting decreased safety awareness/impulsivity; 01/14/23: spouse reports inconsistent use of walker at times and pt walks in socks without grips.  Goal status: in progress  LONG TERM GOALS: Target date: 03/04/2023  Pt will increase FOTO score by (TBD) to indicate improvement in perceived functional performance with daily tasks. Baseline: Eval: FOTO to be given  next session; 01/14/23: OT FOTO same as PT (PT managing) Goal status: in progress  2.  Pt will utilize AE/activity modification to participate in face time calls with family [place] with set up/supv. Baseline: Eval: Pt reports he is struggling to accurately press buttons on his cell phone, often having spouse assist to manage a call (Initial recommendation at eval to transition to tablet for Face time calls to work with larger screen surface; spouse receptive); 03/15/22: No consistent changes implemented yet Goal status: in progress  3.  Pt will use adapted writing aids/utilize positioning strategies/activity modifications to enable pt to take simple notes when reading scripture and to write checks legibly. Baseline: Eval: Pt reports he has not been able to take notes in awhile d/t poor legibility and this task requiring increased effort and fatigue; 01/14/23: Pt tries to write checks as needed in the evening (more energy this time of day); variety of adapted pens have been tried; legibility inconsistent Goal status: in progress  4.  Pt will increase bilat grip strength by 5 or more lbs to improve ability to open containers.  Baseline: Eval: R grip: 30 lbs, L grip: 45 lbs; 01/14/23: R 38 lbs, L 39 lbs Goal status: in progress  5.  Pt will improve [facility] skills to increase efficiency with manipulation of eating and grooming utensils.   Baseline: Eval: R 33 sec, L 41 sec; 01/14/23: R 28 sec; L 29 sec Goal status: in progress  ASSESSMENT:  CLINICAL IMPRESSION: Pt had a fall this week after losing his balance while getting up from his transfer tub bench.  Spouse reports that there is a grab bar for pt to hold to.  Pt fell and hit his R side and now has rib pain.  Pt just finished PT session and PT verbalized pt with no SOB, just point tenderness to the R rib cage, and no bruising.  Pt/therapists will continue to monitor.  Targeted [facility] skills d/t higher pain levels this date.  Pt required occasional  assist with combination movements such as  maintaining prehension of tweezers while pulling a peg up from grooved slots, as this task required grasping pegs from different angles as grooves face varying directions.  Handwriting requiring high effort today, as pt tends to present with more bradykinesia and tremors at times of high pain levels.  Pt did well with letter height with the external cue of a high lighter to mark lined paper to minimize micrographia, but continues to have letter crowding towards the end of a sentence.  Pt continues to benefit from skilled OT to review/progress HEP, review/advise on activity modifications/AE, provide exercises and activities to promote hand strength and coordination skills, and reinforce fall prevention strategies to maximize safety, efficiency, and independence with daily tasks.  Pt./spouse continue to be in agreement with poc.   PERFORMANCE DEFICITS: in functional skills including ADLs, IADLs, coordination, dexterity, strength, pain, Fine motor control, Gross motor control, mobility, balance, body mechanics, endurance, decreased knowledge of use of DME, and UE functional use, cognitive skills including memory and safety awareness, and psychosocial skills including coping strategies, environmental adaptation, habits, and routines and behaviors.   IMPAIRMENTS: are limiting patient from ADLs, IADLs, leisure, and social participation.   CO-MORBIDITIES: has co-morbidities such as chronic pain syndrome, spinal stenosis, DDD, dizziness   that affects occupational performance. Patient will benefit from skilled OT to address above impairments and improve overall function.  MODIFICATION OR ASSISTANCE TO COMPLETE EVALUATION: No modification of tasks or assist necessary to complete an evaluation.  OT OCCUPATIONAL PROFILE AND HISTORY: Problem focused assessment: Including review of records relating to presenting problem.  CLINICAL DECISION MAKING: Moderate - several treatment  options, min-mod task modification necessary  REHAB POTENTIAL: Good  EVALUATION COMPLEXITY: Low    PLAN:  OT FREQUENCY: 2x/week  OT DURATION: 12 weeks  PLANNED INTERVENTIONS: 97535 self care/ADL training, 16109 therapeutic exercise, 97530 therapeutic activity, 97112 neuromuscular re-education, 97010 moist heat, 97010 cryotherapy, passive range of motion, balance training, functional mobility training, visual/perceptual remediation/compensation, psychosocial skills training, energy conservation, coping strategies training, patient/family education, and DME and/or AE instructions  RECOMMENDED OTHER SERVICES: None at this time  CONSULTED AND AGREED WITH PLAN OF CARE: family member/caregiver  PLAN FOR NEXT SESSION:   Danelle Earthly, MS, OTR/L 02/04/2023, 12:27 PM

## 2023-02-08 NOTE — Telephone Encounter (Signed)
The patients wife is asking what you can do for him since insurance denied the facets.

## 2023-02-09 ENCOUNTER — Ambulatory Visit: Payer: Medicare HMO

## 2023-02-09 DIAGNOSIS — R2681 Unsteadiness on feet: Secondary | ICD-10-CM

## 2023-02-09 DIAGNOSIS — G20C Parkinsonism, unspecified: Secondary | ICD-10-CM

## 2023-02-09 DIAGNOSIS — R42 Dizziness and giddiness: Secondary | ICD-10-CM | POA: Diagnosis not present

## 2023-02-09 DIAGNOSIS — M6281 Muscle weakness (generalized): Secondary | ICD-10-CM | POA: Diagnosis not present

## 2023-02-09 DIAGNOSIS — R262 Difficulty in walking, not elsewhere classified: Secondary | ICD-10-CM | POA: Diagnosis not present

## 2023-02-09 DIAGNOSIS — R278 Other lack of coordination: Secondary | ICD-10-CM | POA: Diagnosis not present

## 2023-02-09 DIAGNOSIS — M5459 Other low back pain: Secondary | ICD-10-CM | POA: Diagnosis not present

## 2023-02-09 NOTE — Therapy (Unsigned)
OUTPATIENT OCCUPATIONAL THERAPY NEURO TREATMENT NOTE  Patient Name: Joseph Arroyo MRN: [id number] DOB:Jul 30, 1940, 82 y.o., male Today's Date: 02/09/2023  PCP: Dr. Saralyn Pilar REFERRING PROVIDER: PCP  END OF SESSION:  OT End of Session - 02/09/23 1124     Visit Number 15    Number of Visits 24    Date for OT Re-Evaluation 03/04/23    Progress Note Due on Visit 10    OT Start Time 1100    OT Stop Time 1145    OT Time Calculation (min) 45 min    Equipment Utilized During Treatment RW    Activity Tolerance Patient tolerated treatment well    Behavior During Therapy [facility] for tasks assessed/performed            Past Medical History:  Diagnosis Date   Allergic rhinitis due to pollen 11/21/2007   Brachial neuritis or radiculitis NOS    Cervicalgia    Concussion    age 66 - s/p accident   Costal chondritis    Depression    Essential and other specified forms of tremor    GERD (gastroesophageal reflux disease)    in past   Lumbago    Occlusion and stenosis of carotid artery without mention of cerebral infarction    Seizures [facility]    age 57 - after concussion   Past Surgical History:  Procedure Laterality Date   CATARACT EXTRACTION Right 07/18/14   CATARACT EXTRACTION W/PHACO Left 08/01/2014   Procedure: CATARACT EXTRACTION PHACO AND INTRAOCULAR LENS PLACEMENT (IOC);  Surgeon: Lockie Mola, MD;  Location: [facility];  Service: Ophthalmology;  Laterality: Left;  IVA TOPICAL   COLONOSCOPY  2008   OTHER SURGICAL HISTORY  2002   ear surgery   STAPEDES SURGERY Right 2002   Patient Active Problem List   Diagnosis Date Noted   Dizziness 10/02/2022   Lumbar facet joint pain 06/25/2022   Osteoarthritis of facet joint of lumbar spine 06/25/2022   Lumbosacral facet joint hypertrophy 06/25/2022   Parkinson's disease with dyskinesia (HCC) 01/27/2022   Dysphagia, pharyngeal phase 09/17/2021   Cervicalgia 08/05/2021   Spondylosis without myelopathy  or radiculopathy, lumbosacral region 06/24/2021   Lumbar central spinal stenosis, w/o neurogenic claudication (L4-5) 06/03/2021   Lumbar lateral recess stenosis (Bilateral: L2-3, L4-5) (Right: L3-4) 06/03/2021   Lumbosacral foraminal stenosis (Bilateral: L2-3, L3-4) (Left: L5-S1) 06/03/2021   Lumbar nerve root impingement (Right: L3 at L2-3 & L3-4) 06/03/2021   Ligamentum flavum hypertrophy (L3-4, L4-5) 06/03/2021   Abnormal MRI, lumbar spine (04/23/2021) 04/24/2021   Long term prescription benzodiazepine use (alprazolam) (Xanax) 03/24/2021   Grade 1 Retrolisthesis of L2/L3 (5 mm) and L3/L4 (3 mm) 03/24/2021   Levoscoliosis of lumbar spine (L3-4 apex) 03/24/2021   DDD (degenerative disc disease), lumbosacral 03/24/2021   Lumbosacral facet arthropathy (Left: L3-4, L4-5, and L5-S1) 03/24/2021   Tricompartment osteoarthritis of knee (Left) 03/24/2021   Baker cyst (Left) 03/24/2021   Chronic low back pain (1ry area of Pain) (Bilateral) (R>L) w/o sciatica 03/24/2021   Lumbar facet syndrome (Bilateral) 03/24/2021   Chronic lower extremity pain (2ry area of Pain) (Bilateral) (L>R) 03/24/2021   Lumbosacral radiculitis/sensory radiculopathy at L2 (Bilateral) 03/24/2021   Lumbosacral radiculitis/sensory radiculopathy at L3 (Bilateral) 03/24/2021   Chronic pain syndrome 03/23/2021   Pharmacologic therapy 03/23/2021   Disorder of skeletal system 03/23/2021   Problems influencing health status 03/23/2021   PAD (peripheral artery disease) (HCC) 10/01/2020   GAD (generalized anxiety disorder) 01/13/2018   MDD (major depressive disorder)  01/13/2018   Umbilical hernia 09/04/2017   Chronic knee pain (Left) 08/11/2017   Derangement of medial meniscus, posterior horn (Left) 08/11/2017   Vitamin D insufficiency 03/05/2016   BPH without obstruction/lower urinary tract symptoms 07/08/2015   Chronic fatigue 10/23/2014   Major depression, recurrent, full remission (HCC) 07/26/2013   Unsteady gait 07/26/2013    Orthostatic hypotension 07/26/2013   Depression 07/26/2013   Anxiety 06/23/2013   Chronic anxiety 06/23/2013   Tremor 05/18/2013   Bilateral carotid artery stenosis 08/30/2012   ONSET DATE: 2015  REFERRING DIAG: Parkinson's Disease  THERAPY DIAG:  Muscle weakness (generalized)  Other lack of coordination  Atypical parkinsonism (HCC)  Rationale for Evaluation and Treatment: Rehabilitation  SUBJECTIVE:  SUBJECTIVE STATEMENT: Pt reports a lot of back pain today, and his ribs are still sore, but improving.   Pt accompanied by: significant other, spouse, [name]  PERTINENT HISTORY: Hx of Parkinson's disease (HCC); Chronic knee pain (Left); Derangement of medial meniscus, posterior horn (Left); PAD (peripheral artery disease) (HCC); Chronic pain syndrome; DDD (degenerative disc disease) lumbosacral; osteoarthritis of knee (Left); Chronic low back pain, Lumbar facet syndrome (Bilateral); Chronic lower extremity pain.   PRECAUTIONS: Fall  WEIGHT BEARING RESTRICTIONS: No  PAIN: 02/09/23: 5/10 pain low back and R rib cage Are you having pain? Yes: NPRS scale: 8-9/10 Pain location: low back  Pain description: aching, sometimes sharp  Aggravating factors: activity  Relieving factors: lying down, heat, ice pack  FALLS: Has patient fallen in last 6 months? Yes. Number of falls 4  LIVING ENVIRONMENT: Lives with: lives with their spouse Lives in: 1 level home Stairs: 4 steps, a landing, then 3 more steps with bilat rails.  Has following equipment at home: Dan Humphreys - 2 wheeled, Shower bench, bed side commode, and Grab bars   PLOF: Needs assistance with ADLs.  Pt performs basic self care with set up assist, extra time to manage clothing fasteners, occasional min A on days with worsening Parkinson's symptoms or back pain.    PATIENT GOALS: Pt wants to regain some strength and coordination skills in bilat hands.   OBJECTIVE:  Note: Objective measures were completed at Evaluation unless  otherwise noted.  HAND DOMINANCE: Right  ADLs: Overall ADLs: Extensive time required; occasional min A from spouse Transfers/ambulation related to ADLs: supv-modified indep with RW Eating: difficulty cutting food, struggles with loading food on a fork (uses built up foam handles at times); spouse cuts steak Grooming: electric toothbrush and standard toothbrush, but mostly uses electric; increased difficulty with brushing teeth and shaving (has electric trimmer and regular razor) UB Dressing: magnetic buttons on some shirts; more difficult to button shirts, occasional help with donning a shirt (min A)  LB Dressing: difficulty tying shoe laces, increased time; uses long shoe horn, has a hip kit; occasional assist to don socks  Toileting: modified indep/extra time  Bathing: able to take a shower with supv with tub bench Tub Shower transfers: transfer tub bench Equipment:  Pt does have hip kit and he regularly uses the long shoe horn.  Spouse reports pt may need some review with the other tools.   IADLs: Shopping: Spouse manages Light housekeeping: Spouse manages Meal Prep: Spouse manages Community mobility: Uses RW and requires frequent sitting breaks d/t back pain Medication management: modified Engineer, materials: Extra time for writing checks, with occasional assist from spouse on days when tremors are worse Handwriting: 90% legible, though pt reports this is an exceptional day today, and pt reports he's recently had to  rip of checks and have spouse take over d/t poor legibility.  MOBILITY STATUS: Hx of falls; spouse reports pt to have had 4 falls in the last 6 mo  POSTURE COMMENTS:  rounded shoulders, increased thoracic kyphosis, and hx of scoliosis , L shoulder rests significantly lower than R Sitting balance: Supports self with one extremity  ACTIVITY TOLERANCE: Activity tolerance: limited by low back pain   FUNCTIONAL OUTCOME MEASURES: FOTO: TBD  (PT managing  FOTO)  UPPER EXTREMITY ROM:  BUEs WFL  UPPER EXTREMITY MMT:     MMT Right eval Left eval  Shoulder flexion 5 5  Shoulder abduction 5 5  Shoulder adduction    Shoulder extension    Shoulder internal rotation 4 4  Shoulder external rotation 4 4  Middle trapezius    Lower trapezius    Elbow flexion    Elbow extension    Wrist flexion    Wrist extension    Wrist ulnar deviation    Wrist radial deviation    Wrist pronation    Wrist supination    (Blank rows = not tested)  HAND FUNCTION: Eval: Grip strength: Right: 30 lbs; Left: 45 lbs, Lateral pinch: Right: 12 lbs, Left: 5 lbs, and 3 point pinch: Right: 10 lbs, Left: 6 lbs 01/14/23: Grip strength: Right: 38 lbs; Left: 39 lbs, Lateral pinch: Right: 11 lbs, Left: 5 lbs, and 3 point pinch: Right: 9 lbs, Left: 5 lbs  COORDINATION: Eval: 9 Hole Peg test: Right: 33 sec; Left: 41 sec 01/14/23: 9 Hole Peg test: Right: 28 sec; Left: 29 sec  SENSATION: Not tested  EDEMA: No visible edema  MUSCLE TONE: RUE: Within functional limits and LUE: Within functional limits  COGNITION: Overall cognitive status: Within functional limits for tasks assessed; spouse reports some impulsivity/poor safety awareness contributing to falls at home  VISION: No recent changes; wears glasses for reading Visual history: macular degeneration, hx of cataract removal both eyes  PERCEPTION: Not tested  PRAXIS: Impaired: Motor planning  TODAY'S TREATMENT:                                                                                                                              DATE: 02/09/23 Moist heat applied to low back with support of lumbar pillow; a second pillow wedged between R side and arm rest of chair for midline position and back pain management throughout session.  Self Care: Practiced handwriting skills with use of high lighted lines as an external cue for letter height, and using 1 finger breadth for word spacing.  Pt with good carryover  of forearms supported on table top to maximize distal stability.   PATIENT EDUCATION: Education details: FMC/dexterity skills Person educated: Patient and Spouse Education method: Explanation, demo Education comprehension: verbalized understanding, demonstrated understanding with vc and tactile cues  HOME EXERCISE PROGRAM: Pt instructed to use his theraputty or his hand strengthener (spring based), [facility] exercises (handout issued), red theraband for bilat shoulder  strengthening (ER/IR)  GOALS: Goals reviewed with patient? Yes  SHORT TERM GOALS: Target date: 01/21/23  Pt will perform HEP with supv for increasing strength and coordination in B hands.  Baseline: Eval: Will review previous HEP and add to as appropriate; 01/14/23: Pt using theraputty; added theraband today to target ER/IR strengthening at bilat shoulders Goal status: in progress   2.  Pt will implement fall prevention strategies with min vc from spouse at least 50% of the time.  Baseline: Spouse reports pt with 4 falls over the last 6 months, noting decreased safety awareness/impulsivity; 01/14/23: spouse reports inconsistent use of walker at times and pt walks in socks without grips.  Goal status: in progress  LONG TERM GOALS: Target date: 03/04/2023  Pt will increase FOTO score by (TBD) to indicate improvement in perceived functional performance with daily tasks. Baseline: Eval: FOTO to be given next session; 01/14/23: OT FOTO same as PT (PT managing) Goal status: in progress  2.  Pt will utilize AE/activity modification to participate in face time calls with family [place] with set up/supv. Baseline: Eval: Pt reports he is struggling to accurately press buttons on his cell phone, often having spouse assist to manage a call (Initial recommendation at eval to transition to tablet for Face time calls to work with larger screen surface; spouse receptive); 03/15/22: No consistent changes implemented yet Goal status: in  progress  3.  Pt will use adapted writing aids/utilize positioning strategies/activity modifications to enable pt to take simple notes when reading scripture and to write checks legibly. Baseline: Eval: Pt reports he has not been able to take notes in awhile d/t poor legibility and this task requiring increased effort and fatigue; 01/14/23: Pt tries to write checks as needed in the evening (more energy this time of day); variety of adapted pens have been tried; legibility inconsistent Goal status: in progress  4.  Pt will increase bilat grip strength by 5 or more lbs to improve ability to open containers.  Baseline: Eval: R grip: 30 lbs, L grip: 45 lbs; 01/14/23: R 38 lbs, L 39 lbs Goal status: in progress  5.  Pt will improve [facility] skills to increase efficiency with manipulation of eating and grooming utensils.   Baseline: Eval: R 33 sec, L 41 sec; 01/14/23: R 28 sec; L 29 sec Goal status: in progress  ASSESSMENT:  CLINICAL IMPRESSION: Pt had a fall this week after losing his balance while getting up from his transfer tub bench.  Spouse reports that there is a grab bar for pt to hold to.  Pt fell and hit his R side and now has rib pain.  Pt just finished PT session and PT verbalized pt with no SOB, just point tenderness to the R rib cage, and no bruising.  Pt/therapists will continue to monitor.  Targeted [facility] skills d/t higher pain levels this date.  Pt required occasional assist with combination movements such as maintaining prehension of tweezers while pulling a peg up from grooved slots, as this task required grasping pegs from different angles as grooves face varying directions.  Handwriting requiring high effort today, as pt tends to present with more bradykinesia and tremors at times of high pain levels.  Pt did well with letter height with the external cue of a high lighter to mark lined paper to minimize micrographia, but continues to have letter crowding towards the end of a sentence.  Pt  continues to benefit from skilled OT to review/progress HEP, review/advise on activity modifications/AE,  provide exercises and activities to promote hand strength and coordination skills, and reinforce fall prevention strategies to maximize safety, efficiency, and independence with daily tasks.  Pt./spouse continue to be in agreement with poc.   PERFORMANCE DEFICITS: in functional skills including ADLs, IADLs, coordination, dexterity, strength, pain, Fine motor control, Gross motor control, mobility, balance, body mechanics, endurance, decreased knowledge of use of DME, and UE functional use, cognitive skills including memory and safety awareness, and psychosocial skills including coping strategies, environmental adaptation, habits, and routines and behaviors.   IMPAIRMENTS: are limiting patient from ADLs, IADLs, leisure, and social participation.   CO-MORBIDITIES: has co-morbidities such as chronic pain syndrome, spinal stenosis, DDD, dizziness   that affects occupational performance. Patient will benefit from skilled OT to address above impairments and improve overall function.  MODIFICATION OR ASSISTANCE TO COMPLETE EVALUATION: No modification of tasks or assist necessary to complete an evaluation.  OT OCCUPATIONAL PROFILE AND HISTORY: Problem focused assessment: Including review of records relating to presenting problem.  CLINICAL DECISION MAKING: Moderate - several treatment options, min-mod task modification necessary  REHAB POTENTIAL: Good  EVALUATION COMPLEXITY: Low    PLAN:  OT FREQUENCY: 2x/week  OT DURATION: 12 weeks  PLANNED INTERVENTIONS: 97535 self care/ADL training, 78295 therapeutic exercise, 97530 therapeutic activity, 97112 neuromuscular re-education, 97010 moist heat, 97010 cryotherapy, passive range of motion, balance training, functional mobility training, visual/perceptual remediation/compensation, psychosocial skills training, energy conservation, coping strategies  training, patient/family education, and DME and/or AE instructions  RECOMMENDED OTHER SERVICES: None at this time  CONSULTED AND AGREED WITH PLAN OF CARE: family member/caregiver  PLAN FOR NEXT SESSION:   Danelle Earthly, MS, OTR/L 02/09/2023, 11:27 AM

## 2023-02-09 NOTE — Therapy (Signed)
OUTPATIENT PHYSICAL THERAPY NEURO TREATMENT Patient Name: Joseph Arroyo MRN: [id number] DOB:01/21/1941, 82 y.o., male Today's Date: 02/09/2023  END OF SESSION:  PT End of Session - 02/09/23 1714     Visit Number 26    Number of Visits 33    Date for PT Re-Evaluation 02/23/23    Authorization Type [employer]    Progress Note Due on Visit 10    PT Start Time 1017    PT Stop Time 1057    PT Time Calculation (min) 40 min    Equipment Utilized During Treatment Gait belt    Activity Tolerance Patient tolerated treatment well;Patient limited by pain    Behavior During Therapy WFL for tasks assessed/performed                         Past Medical History:  Diagnosis Date   Allergic rhinitis due to pollen 11/21/2007   Brachial neuritis or radiculitis NOS    Cervicalgia    Concussion    age 53 - s/p accident   Costal chondritis    Depression    Essential and other specified forms of tremor    GERD (gastroesophageal reflux disease)    in past   Lumbago    Occlusion and stenosis of carotid artery without mention of cerebral infarction    Seizures (HCC)    age 75 - after concussion   Past Surgical History:  Procedure Laterality Date   CATARACT EXTRACTION Right 07/18/14   CATARACT EXTRACTION W/PHACO Left 08/01/2014   Procedure: CATARACT EXTRACTION PHACO AND INTRAOCULAR LENS PLACEMENT (IOC);  Surgeon: Lockie Mola, MD;  Location: [facility];  Service: Ophthalmology;  Laterality: Left;  IVA TOPICAL   COLONOSCOPY  2008   OTHER SURGICAL HISTORY  2002   ear surgery   STAPEDES SURGERY Right 2002   Patient Active Problem List   Diagnosis Date Noted   Dizziness 10/02/2022   Lumbar facet joint pain 06/25/2022   Osteoarthritis of facet joint of lumbar spine 06/25/2022   Lumbosacral facet joint hypertrophy 06/25/2022   Parkinson's disease with dyskinesia (HCC) 01/27/2022   Dysphagia, pharyngeal phase 09/17/2021   Cervicalgia 08/05/2021    Spondylosis without myelopathy or radiculopathy, lumbosacral region 06/24/2021   Lumbar central spinal stenosis, w/o neurogenic claudication (L4-5) 06/03/2021   Lumbar lateral recess stenosis (Bilateral: L2-3, L4-5) (Right: L3-4) 06/03/2021   Lumbosacral foraminal stenosis (Bilateral: L2-3, L3-4) (Left: L5-S1) 06/03/2021   Lumbar nerve root impingement (Right: L3 at L2-3 & L3-4) 06/03/2021   Ligamentum flavum hypertrophy (L3-4, L4-5) 06/03/2021   Abnormal MRI, lumbar spine (04/23/2021) 04/24/2021   Long term prescription benzodiazepine use (alprazolam) (Xanax) 03/24/2021   Grade 1 Retrolisthesis of L2/L3 (5 mm) and L3/L4 (3 mm) 03/24/2021   Levoscoliosis of lumbar spine (L3-4 apex) 03/24/2021   DDD (degenerative disc disease), lumbosacral 03/24/2021   Lumbosacral facet arthropathy (Left: L3-4, L4-5, and L5-S1) 03/24/2021   Tricompartment osteoarthritis of knee (Left) 03/24/2021   Baker cyst (Left) 03/24/2021   Chronic low back pain (1ry area of Pain) (Bilateral) (R>L) w/o sciatica 03/24/2021   Lumbar facet syndrome (Bilateral) 03/24/2021   Chronic lower extremity pain (2ry area of Pain) (Bilateral) (L>R) 03/24/2021   Lumbosacral radiculitis/sensory radiculopathy at L2 (Bilateral) 03/24/2021   Lumbosacral radiculitis/sensory radiculopathy at L3 (Bilateral) 03/24/2021   Chronic pain syndrome 03/23/2021   Pharmacologic therapy 03/23/2021   Disorder of skeletal system 03/23/2021   Problems influencing health status 03/23/2021   PAD (peripheral artery disease) (HCC)  10/01/2020   GAD (generalized anxiety disorder) 01/13/2018   MDD (major depressive disorder) 01/13/2018   Umbilical hernia 09/04/2017   Chronic knee pain (Left) 08/11/2017   Derangement of medial meniscus, posterior horn (Left) 08/11/2017   Vitamin D insufficiency 03/05/2016   BPH without obstruction/lower urinary tract symptoms 07/08/2015   Chronic fatigue 10/23/2014   Major depression, recurrent, full remission (HCC)  07/26/2013   Unsteady gait 07/26/2013   Orthostatic hypotension 07/26/2013   Depression 07/26/2013   Anxiety 06/23/2013   Chronic anxiety 06/23/2013   Tremor 05/18/2013   Bilateral carotid artery stenosis 08/30/2012    PCP: Smitty Cords, DO  REFERRING PROVIDER:   Mecum, Oswaldo Conroy, PA-C    REFERRING DIAG: R42 (ICD-10-CM) - Dizziness   THERAPY DIAG:  Muscle weakness (generalized)  Other lack of coordination  Difficulty in walking, not elsewhere classified  Unsteadiness on feet  ONSET DATE: August 1st 2024  Rationale for Evaluation and Treatment: Rehabilitation  SUBJECTIVE:   SUBJECTIVE STATEMENT  Pt reports rib/side pain has improved. Back pain is mild right now, he took an ibuprofen prior to session. Reports no falls or other updates.  Pt accompanied by: significant other   PERTINENT HISTORY:  The pt is a pleasant 82 yo male referred to PT for dizziness. Pt known to clinic, previously seen for falls in the setting of PD.  Pt spouse present for eval and reports pt was seen by neurologist recently at [facility] following onset of dizziness about two-three weeks ago. He reports they think he has BPPV.  PT spouse reports he had blood work done at this visit which "didn't show anything" and was normal. Pt unsure if his symptoms come on when he is still at rest. He describes his symptoms as spinning. This can be triggered when he lies on his back and also when he sits up from supine. Spinning lasts for minutes. He reports no nausea.  Pt sometimes sees double when he looks to the side and this has been going on for weeks. Denies any one sided weakness or other changes. He has fallen 4 times in past 3 months. Pt spouse reports pt had a fall about a week prior to dizziness onset. Pt reports no neck pain, has hx of LBP. His spouse reports decline in his mobility since this started. Pt uses RW at baseline for ambulation. Pt now being seen primarily for PD impairments. Pt PMH  significant for Parkinson's disease, concussion, cervicalgia, depression, seizures, chronic L knee pain, chronic anxiety, DDD, PAD, chronic pain syndrome, chronic low back pain, please refer to chart for full, extensive, PMH. PAIN:  Are you having pain? No Back pain hurting prior, took 3 ibuprophen.   PRECAUTIONS: Fall   WEIGHT BEARING RESTRICTIONS: No  FALLS: Has patient fallen in last 6 months? Yes. Number of falls 4  LIVING ENVIRONMENT: via chart and confirmed by pt in session Lives with: lives with their spouse Lives in: House/apartment Stairs: 4 Has following equipment at home: Walker - 2 wheeled  PLOF:  needs assistance, pt with PD that affected his mobility/ADLs  PATIENT GOALS: get rid of dizziness  OBJECTIVE:     TREATMENT:  DATE: 02/09/23  Gait belt donned for pt safety with upright activities, heat applied to low back while pt performing seated interventions (reports heat feels good, no adverse reaction to heat)  TE: Gait with RW x 444 ft for endurance/LE strength wearing 2# aw each LE.  Pt rates medium+  Seated- with 2# aw donned each LE Seated march with contralateral UE reach x multiple reps - most challenged with coordination  March 10x each LE -pain limited in low back Seated LAQ with contralateral UE reach x multiple reps - most challenged with coordination  LAQ  1x10 each LE  - pain limited in low back Seated ankle rockers 15x    NMR: STS with BUE shoulder abduction 2x10  Standing balance at RW NBOS horizontal head turns 10x each way Standing balance at RW NBOS vertical head turns 10x each way Standing balance NBOS with reach out outside of BOS to target each UE x multiple reps    PATIENT EDUCATION: Education details: exercise technique Person education: patient, spouse  HOME EXERCISE PROGRAM: Continue as previously given: Access Code: [email or web address] URL:  [email or web address] Date: 01/12/2023 Prepared by: Temple Pacini  Exercises - Seated Shoulder Flexion Full Range Single Arm  - 1 x daily - 7 x weekly - 2 sets - 10 reps  Access Code: [email or web address] URL: [email or web address] Date: 12/08/2022 Prepared by: Temple Pacini  Exercises - Seated March  - 1 x daily - 5-6 x weekly - 4 sets - 10 reps - Seated Long Arc Quad  - 1 x daily - 5-6 x weekly - 2 sets - 15 reps - Seated Heel Toe Raises  - 1 x daily - 5-6 x weekly - 2 sets - 20 reps   Access Code: [email or web address] URL: [email or web address] Date: 12/24/2022 Prepared by: Tomasa Hose  Exercises - Standing Lumbar Extension at Wall - Forearms  - 1 x daily - 7 x weekly - 3 sets - 10 reps - Prone Press Up On Elbows  - 1 x daily - 7 x weekly - 3 sets - 10 reps - Seated Lumbar Extension  - 1 x daily - 7 x weekly - 3 sets - 10 reps  GOALS:   Goals reviewed with patient? Yes   SHORT TERM GOALS: Target date: 03/23/2023   Patient will be independent in home exercise program to improve dizziness, balance and mobility for better safety and functional independence with ADLs. Baseline: to be initiated future visit/as needed 11/12:  Goal status: IN PROGRESS    LONG TERM GOALS: Target date: 05/04/2023    Patient will increase FOTO score to equal to or greater than 53    to demonstrate statistically significant improvement in mobility and quality of life.  Baseline: 44; 01/12/23: 52  Goal status: PARTIALLY MET   2.  Patient will reduce dizziness handicap inventory score to <50, for less dizziness with ADLs and increased safety with home and work tasks.  Baseline: deferred, will complete as pt able: 10/10: 48, still has difficulty with bed mobility  Goal status: MET  3.  The pt will report at least a 60% decrease in positional or movement triggered dizziness symptoms in order to indicate decreased fall risk and increased balance/safety with mobility. Baseline: pt  presents with decreased mobility due to dizziness, dizziness/vertigo triggered by particular positional; changes; 10/10: 60-70%  Goal status: MET  4.  Patient will reduce timed up and go to <11 seconds to reduce fall risk and demonstrate improved transfer/gait ability. Baseline: 9/5: 18 sec; 10/10: 16  seconds seconds; 11/12: 17.5 sec with RW  Goal status: IN PROGRESS   5.Patient (> 50 years old) will complete five times sit to stand test in < 15 seconds indicating an increased LE strength and improved balance. Baseline: 12/01/22: 26 sec; 11/12: 22.5 sec hands-free  Goal status: IN PROGRESS   6. Patient will increase 10 meter walk test to >1.24m/s as to improve gait speed for better community ambulation and to reduce fall risk. Baseline: 12/01/22: 0.6 m/s with RW; 11/12:  0.95 m/s with RW  Goal status: PARTIALLY MET   ASSESSMENT:  CLINICAL IMPRESSION: Pt feeling better today and interventions able to be progressed some/modified still as needed.  Pt able to complete gait for strength/endurance without reports of increased symptoms. He initiated seated strengthening/coordination exercises and found these challenging. Pt will continue to benefit from skilled therapy to address remaining deficits in order to improve overall QoL and return to PLOF.      OBJECTIVE IMPAIRMENTS: Abnormal gait, decreased activity tolerance, decreased balance, decreased coordination, decreased mobility, difficulty walking, decreased strength, dizziness, impaired vision/preception, improper body mechanics, and postural dysfunction.   ACTIVITY LIMITATIONS: lifting, bending, squatting, stairs, transfers, bed mobility, toileting, dressing, hygiene/grooming, and locomotion level  PARTICIPATION LIMITATIONS: meal prep, cleaning, laundry, shopping, community activity, and yard work  PERSONAL FACTORS: Age, Fitness, and 3+ comorbidities: Pt PMH significant for Parkinson's disease, concussion, cervicalgia, depression, seizures,  chronic L knee pain, chronic anxiety, DDD, PAD, chronic pain syndrome, chronic low back pain, please refer to chart for full, extensive, PMH.  are also affecting patient's functional outcome.   REHAB POTENTIAL: Good  CLINICAL DECISION MAKING: Evolving/moderate complexity  EVALUATION COMPLEXITY: Moderate   PLAN:  PT FREQUENCY: 1-2x/week  PT DURATION: 12 weeks  PLANNED INTERVENTIONS: Therapeutic exercises, Therapeutic activity, Neuromuscular re-education, Balance training, Gait training, Patient/Family education, Self Care, Joint mobilization, Stair training, Vestibular training, Canalith repositioning, Visual/preceptual remediation/compensation, DME instructions, Electrical stimulation, Wheelchair mobility training, Spinal mobilization, Cryotherapy, Moist heat, Splintting, Taping, Traction, Manual therapy, and Re-evaluation  PLAN FOR NEXT SESSION: attempt standing/supported PD interventions, balance    Temple Pacini PT, DPT  Physical Therapist - [facility]  [facility]  02/09/23, 5:17 PM

## 2023-02-09 NOTE — Telephone Encounter (Signed)
Called and left vm with them asking if they want [place] to try to get auth for lesi, per Dr. Laban Emperor

## 2023-02-11 ENCOUNTER — Ambulatory Visit: Payer: Medicare HMO

## 2023-02-11 DIAGNOSIS — R262 Difficulty in walking, not elsewhere classified: Secondary | ICD-10-CM

## 2023-02-11 DIAGNOSIS — R42 Dizziness and giddiness: Secondary | ICD-10-CM | POA: Diagnosis not present

## 2023-02-11 DIAGNOSIS — R278 Other lack of coordination: Secondary | ICD-10-CM | POA: Diagnosis not present

## 2023-02-11 DIAGNOSIS — M5459 Other low back pain: Secondary | ICD-10-CM | POA: Diagnosis not present

## 2023-02-11 DIAGNOSIS — M6281 Muscle weakness (generalized): Secondary | ICD-10-CM | POA: Diagnosis not present

## 2023-02-11 DIAGNOSIS — G20C Parkinsonism, unspecified: Secondary | ICD-10-CM

## 2023-02-11 DIAGNOSIS — R2681 Unsteadiness on feet: Secondary | ICD-10-CM

## 2023-02-11 NOTE — Therapy (Signed)
OUTPATIENT OCCUPATIONAL THERAPY NEURO TREATMENT NOTE  Patient Name: Joseph Arroyo MRN: [id number] DOB:11/22/1940, 82 y.o., male Today's Date: 02/11/2023  PCP: Dr. Saralyn Pilar REFERRING PROVIDER: PCP  END OF SESSION:  OT End of Session - 02/11/23 1452     Visit Number 16    Number of Visits 24    Date for OT Re-Evaluation 03/04/23    Progress Note Due on Visit 10    OT Start Time 1400    OT Stop Time 1445    OT Time Calculation (min) 45 min    Equipment Utilized During Treatment RW    Activity Tolerance Patient tolerated treatment well    Behavior During Therapy [facility] for tasks assessed/performed            Past Medical History:  Diagnosis Date   Allergic rhinitis due to pollen 11/21/2007   Brachial neuritis or radiculitis NOS    Cervicalgia    Concussion    age 13 - s/p accident   Costal chondritis    Depression    Essential and other specified forms of tremor    GERD (gastroesophageal reflux disease)    in past   Lumbago    Occlusion and stenosis of carotid artery without mention of cerebral infarction    Seizures [facility])    age 55 - after concussion   Past Surgical History:  Procedure Laterality Date   CATARACT EXTRACTION Right 07/18/14   CATARACT EXTRACTION W/PHACO Left 08/01/2014   Procedure: CATARACT EXTRACTION PHACO AND INTRAOCULAR LENS PLACEMENT (IOC);  Surgeon: Lockie Mola, MD;  Location: [facility];  Service: Ophthalmology;  Laterality: Left;  IVA TOPICAL   COLONOSCOPY  2008   OTHER SURGICAL HISTORY  2002   ear surgery   STAPEDES SURGERY Right 2002   Patient Active Problem List   Diagnosis Date Noted   Dizziness 10/02/2022   Lumbar facet joint pain 06/25/2022   Osteoarthritis of facet joint of lumbar spine 06/25/2022   Lumbosacral facet joint hypertrophy 06/25/2022   Parkinson's disease with dyskinesia (HCC) 01/27/2022   Dysphagia, pharyngeal phase 09/17/2021   Cervicalgia 08/05/2021   Spondylosis without myelopathy  or radiculopathy, lumbosacral region 06/24/2021   Lumbar central spinal stenosis, w/o neurogenic claudication (L4-5) 06/03/2021   Lumbar lateral recess stenosis (Bilateral: L2-3, L4-5) (Right: L3-4) 06/03/2021   Lumbosacral foraminal stenosis (Bilateral: L2-3, L3-4) (Left: L5-S1) 06/03/2021   Lumbar nerve root impingement (Right: L3 at L2-3 & L3-4) 06/03/2021   Ligamentum flavum hypertrophy (L3-4, L4-5) 06/03/2021   Abnormal MRI, lumbar spine (04/23/2021) 04/24/2021   Long term prescription benzodiazepine use (alprazolam) (Xanax) 03/24/2021   Grade 1 Retrolisthesis of L2/L3 (5 mm) and L3/L4 (3 mm) 03/24/2021   Levoscoliosis of lumbar spine (L3-4 apex) 03/24/2021   DDD (degenerative disc disease), lumbosacral 03/24/2021   Lumbosacral facet arthropathy (Left: L3-4, L4-5, and L5-S1) 03/24/2021   Tricompartment osteoarthritis of knee (Left) 03/24/2021   Baker cyst (Left) 03/24/2021   Chronic low back pain (1ry area of Pain) (Bilateral) (R>L) w/o sciatica 03/24/2021   Lumbar facet syndrome (Bilateral) 03/24/2021   Chronic lower extremity pain (2ry area of Pain) (Bilateral) (L>R) 03/24/2021   Lumbosacral radiculitis/sensory radiculopathy at L2 (Bilateral) 03/24/2021   Lumbosacral radiculitis/sensory radiculopathy at L3 (Bilateral) 03/24/2021   Chronic pain syndrome 03/23/2021   Pharmacologic therapy 03/23/2021   Disorder of skeletal system 03/23/2021   Problems influencing health status 03/23/2021   PAD (peripheral artery disease) (HCC) 10/01/2020   GAD (generalized anxiety disorder) 01/13/2018   MDD (major depressive disorder)  01/13/2018   Umbilical hernia 09/04/2017   Chronic knee pain (Left) 08/11/2017   Derangement of medial meniscus, posterior horn (Left) 08/11/2017   Vitamin D insufficiency 03/05/2016   BPH without obstruction/lower urinary tract symptoms 07/08/2015   Chronic fatigue 10/23/2014   Major depression, recurrent, full remission (HCC) 07/26/2013   Unsteady gait 07/26/2013    Orthostatic hypotension 07/26/2013   Depression 07/26/2013   Anxiety 06/23/2013   Chronic anxiety 06/23/2013   Tremor 05/18/2013   Bilateral carotid artery stenosis 08/30/2012   ONSET DATE: 2015  REFERRING DIAG: Parkinson's Disease  THERAPY DIAG:  Muscle weakness (generalized)  Other lack of coordination  Atypical parkinsonism (HCC)  Rationale for Evaluation and Treatment: Rehabilitation  SUBJECTIVE:  SUBJECTIVE STATEMENT: Pt reports doing well today. Pt accompanied by: significant other, spouse, Joseph Arroyo  PERTINENT HISTORY: Hx of Parkinson's disease (HCC); Chronic knee pain (Left); Derangement of medial meniscus, posterior horn (Left); PAD (peripheral artery disease) (HCC); Chronic pain syndrome; DDD (degenerative disc disease) lumbosacral; osteoarthritis of knee (Left); Chronic low back pain, Lumbar facet syndrome (Bilateral); Chronic lower extremity pain.   PRECAUTIONS: Fall  WEIGHT BEARING RESTRICTIONS: No  PAIN: 02/11/23: 5/10 pain low back and R rib cage Are you having pain? Yes: NPRS scale: 8-9/10 Pain location: low back  Pain description: aching, sometimes sharp  Aggravating factors: activity  Relieving factors: lying down, heat, ice pack  FALLS: Has patient fallen in last 6 months? Yes. Number of falls 4  LIVING ENVIRONMENT: Lives with: lives with their spouse Lives in: 1 level home Stairs: 4 steps, a landing, then 3 more steps with bilat rails.  Has following equipment at home: Dan Humphreys - 2 wheeled, Shower bench, bed side commode, and Grab bars   PLOF: Needs assistance with ADLs.  Pt performs basic self care with set up assist, extra time to manage clothing fasteners, occasional min A on days with worsening Parkinson's symptoms or back pain.    PATIENT GOALS: Pt wants to regain some strength and coordination skills in bilat hands.   OBJECTIVE:  Note: Objective measures were completed at Evaluation unless otherwise noted.  HAND DOMINANCE:  Right  ADLs: Overall ADLs: Extensive time required; occasional min A from spouse Transfers/ambulation related to ADLs: supv-modified indep with RW Eating: difficulty cutting food, struggles with loading food on a fork (uses built up foam handles at times); spouse cuts steak Grooming: electric toothbrush and standard toothbrush, but mostly uses electric; increased difficulty with brushing teeth and shaving (has electric trimmer and regular razor) UB Dressing: magnetic buttons on some shirts; more difficult to button shirts, occasional help with donning a shirt (min A)  LB Dressing: difficulty tying shoe laces, increased time; uses long shoe horn, has a hip kit; occasional assist to don socks  Toileting: modified indep/extra time  Bathing: able to take a shower with supv with tub bench Tub Shower transfers: transfer tub bench Equipment:  Pt does have hip kit and he regularly uses the long shoe horn.  Spouse reports pt may need some review with the other tools.   IADLs: Shopping: Spouse manages Light housekeeping: Spouse manages Meal Prep: Spouse manages Community mobility: Uses RW and requires frequent sitting breaks d/t back pain Medication management: modified Engineer, materials: Extra time for writing checks, with occasional assist from spouse on days when tremors are worse Handwriting: 90% legible, though pt reports this is an exceptional day today, and pt reports he's recently had to rip of checks and have spouse take over d/t poor legibility.  MOBILITY  STATUS: Hx of falls; spouse reports pt to have had 4 falls in the last 6 mo  POSTURE COMMENTS:  rounded shoulders, increased thoracic kyphosis, and hx of scoliosis , L shoulder rests significantly lower than R Sitting balance: Supports self with one extremity  ACTIVITY TOLERANCE: Activity tolerance: limited by low back pain   FUNCTIONAL OUTCOME MEASURES: FOTO: TBD  (PT managing FOTO)  UPPER EXTREMITY ROM:  BUEs  WFL  UPPER EXTREMITY MMT:     MMT Right eval Left eval  Shoulder flexion 5 5  Shoulder abduction 5 5  Shoulder adduction    Shoulder extension    Shoulder internal rotation 4 4  Shoulder external rotation 4 4  Middle trapezius    Lower trapezius    Elbow flexion    Elbow extension    Wrist flexion    Wrist extension    Wrist ulnar deviation    Wrist radial deviation    Wrist pronation    Wrist supination    (Blank rows = not tested)  HAND FUNCTION: Eval: Grip strength: Right: 30 lbs; Left: 45 lbs, Lateral pinch: Right: 12 lbs, Left: 5 lbs, and 3 point pinch: Right: 10 lbs, Left: 6 lbs 01/14/23: Grip strength: Right: 38 lbs; Left: 39 lbs, Lateral pinch: Right: 11 lbs, Left: 5 lbs, and 3 point pinch: Right: 9 lbs, Left: 5 lbs  COORDINATION: Eval: 9 Hole Peg test: Right: 33 sec; Left: 41 sec 01/14/23: 9 Hole Peg test: Right: 28 sec; Left: 29 sec  SENSATION: Not tested  EDEMA: No visible edema  MUSCLE TONE: RUE: Within functional limits and LUE: Within functional limits  COGNITION: Overall cognitive status: Within functional limits for tasks assessed; spouse reports some impulsivity/poor safety awareness contributing to falls at home  VISION: No recent changes; wears glasses for reading Visual history: macular degeneration, hx of cataract removal both eyes  PERCEPTION: Not tested  PRAXIS: Impaired: Motor planning  TODAY'S TREATMENT:                                                                                                                              DATE: 02/11/23 Moist heat applied to low back with support of lumbar pillow; a second pillow wedged between R side and arm rest of chair for midline position and back pain management throughout session.  Therapeutic Activity: Pt participated in activities to promote R/L FMC/dexterity skills.  Pt worked with International aid/development worker, working to store 3 in hand and rotate dice within fingertips in order to [employer] on  table top.  Pt worked with standard tweezers, working pick up small cubed beads from table top and place into narrow containers.  Alternated between R/L hands with above noted activities.    PATIENT EDUCATION: Education details: Progress towards goals, condition management Person educated: Patient and Spouse Education method: Explanation, demo Education comprehension: verbalized understanding, demonstrated understanding with vc and tactile cues  HOME EXERCISE PROGRAM: Pt instructed to use his theraputty  or his hand strengthener (spring based), [facility] exercises (handout issued), red theraband for bilat shoulder strengthening (ER/IR)  GOALS: Goals reviewed with patient? Yes  SHORT TERM GOALS: Target date: 01/21/23  Pt will perform HEP with supv for increasing strength and coordination in B hands.  Baseline: Eval: Will review previous HEP and add to as appropriate; 01/14/23: Pt using theraputty; added theraband today to target ER/IR strengthening at bilat shoulders Goal status: in progress   2.  Pt will implement fall prevention strategies with min vc from spouse at least 50% of the time.  Baseline: Spouse reports pt with 4 falls over the last 6 months, noting decreased safety awareness/impulsivity; 01/14/23: spouse reports inconsistent use of walker at times and pt walks in socks without grips.  Goal status: in progress  LONG TERM GOALS: Target date: 03/04/2023  Pt will increase FOTO score by (TBD) to indicate improvement in perceived functional performance with daily tasks. Baseline: Eval: FOTO to be given next session; 01/14/23: OT FOTO same as PT (PT managing) Goal status: in progress  2.  Pt will utilize AE/activity modification to participate in face time calls with family [place] with set up/supv. Baseline: Eval: Pt reports he is struggling to accurately press buttons on his cell phone, often having spouse assist to manage a call (Initial recommendation at eval to transition to tablet  for Face time calls to work with larger screen surface; spouse receptive); 03/15/22: No consistent changes implemented yet Goal status: in progress  3.  Pt will use adapted writing aids/utilize positioning strategies/activity modifications to enable pt to take simple notes when reading scripture and to write checks legibly. Baseline: Eval: Pt reports he has not been able to take notes in awhile d/t poor legibility and this task requiring increased effort and fatigue; 01/14/23: Pt tries to write checks as needed in the evening (more energy this time of day); variety of adapted pens have been tried; legibility inconsistent Goal status: in progress  4.  Pt will increase bilat grip strength by 5 or more lbs to improve ability to open containers.  Baseline: Eval: R grip: 30 lbs, L grip: 45 lbs; 01/14/23: R 38 lbs, L 39 lbs Goal status: in progress  5.  Pt will improve [facility] skills to increase efficiency with manipulation of eating and grooming utensils.   Baseline: Eval: R 33 sec, L 41 sec; 01/14/23: R 28 sec; L 29 sec Goal status: in progress  ASSESSMENT: CLINICAL IMPRESSION: Pt inquired about activities to fix his BUE tremors.  Reviewed in current and previous episodes, but OT reinforced progressive nature of pt's disease process, and inability for OT to stop his tremors.  OT advised on role of OT to provide activity modifications/instruct in AE/compensatory strategies, and instruct in FMC/strengthening HEP to maximize pt's performance with daily tasks within pt's current physical capacity.  Pt acknowledged understanding but verbalized that he will continue to pray for full healing.  OT verbalized plan to d/c OT by end of current cert as OT anticipates max rehab potential will be met at that time.  Pt continues to benefit from skilled OT to review/progress HEP, review/advise on activity modifications/AE, provide exercises and activities to promote hand strength and coordination skills, and reinforce fall  prevention strategies to maximize safety, efficiency, and independence with daily tasks.  Pt./spouse continue to be in agreement with poc.   PERFORMANCE DEFICITS: in functional skills including ADLs, IADLs, coordination, dexterity, strength, pain, Fine motor control, Gross motor control, mobility, balance, body mechanics, endurance, decreased  knowledge of use of DME, and UE functional use, cognitive skills including memory and safety awareness, and psychosocial skills including coping strategies, environmental adaptation, habits, and routines and behaviors.   IMPAIRMENTS: are limiting patient from ADLs, IADLs, leisure, and social participation.   CO-MORBIDITIES: has co-morbidities such as chronic pain syndrome, spinal stenosis, DDD, dizziness   that affects occupational performance. Patient will benefit from skilled OT to address above impairments and improve overall function.  MODIFICATION OR ASSISTANCE TO COMPLETE EVALUATION: No modification of tasks or assist necessary to complete an evaluation.  OT OCCUPATIONAL PROFILE AND HISTORY: Problem focused assessment: Including review of records relating to presenting problem.  CLINICAL DECISION MAKING: Moderate - several treatment options, min-mod task modification necessary  REHAB POTENTIAL: Good  EVALUATION COMPLEXITY: Low    PLAN:  OT FREQUENCY: 2x/week  OT DURATION: 12 weeks  PLANNED INTERVENTIONS: 97535 self care/ADL training, 57846 therapeutic exercise, 97530 therapeutic activity, 97112 neuromuscular re-education, 97010 moist heat, 97010 cryotherapy, passive range of motion, balance training, functional mobility training, visual/perceptual remediation/compensation, psychosocial skills training, energy conservation, coping strategies training, patient/family education, and DME and/or AE instructions  RECOMMENDED OTHER SERVICES: None at this time  CONSULTED AND AGREED WITH PLAN OF CARE: family member/caregiver  PLAN FOR NEXT SESSION:    Danelle Earthly, MS, OTR/L 02/11/2023, 2:55 PM

## 2023-02-11 NOTE — Therapy (Signed)
OUTPATIENT PHYSICAL THERAPY NEURO TREATMENT Patient Name: Joseph Arroyo MRN: [id number] DOB:Mar 30, 1940, 82 y.o., male Today's Date: 02/11/2023  END OF SESSION:  PT End of Session - 02/11/23 1446     Visit Number 27    Number of Visits 33    Date for PT Re-Evaluation 02/23/23    Authorization Type [employer]    Progress Note Due on Visit 10    PT Start Time 1447    PT Stop Time 1528    PT Time Calculation (min) 41 min    Equipment Utilized During Treatment Gait belt    Activity Tolerance Patient tolerated treatment well;Patient limited by pain    Behavior During Therapy WFL for tasks assessed/performed                         Past Medical History:  Diagnosis Date   Allergic rhinitis due to pollen 11/21/2007   Brachial neuritis or radiculitis NOS    Cervicalgia    Concussion    age 79 - s/p accident   Costal chondritis    Depression    Essential and other specified forms of tremor    GERD (gastroesophageal reflux disease)    in past   Lumbago    Occlusion and stenosis of carotid artery without mention of cerebral infarction    Seizures (HCC)    age 28 - after concussion   Past Surgical History:  Procedure Laterality Date   CATARACT EXTRACTION Right 07/18/14   CATARACT EXTRACTION W/PHACO Left 08/01/2014   Procedure: CATARACT EXTRACTION PHACO AND INTRAOCULAR LENS PLACEMENT (IOC);  Surgeon: Lockie Mola, MD;  Location: [facility];  Service: Ophthalmology;  Laterality: Left;  IVA TOPICAL   COLONOSCOPY  2008   OTHER SURGICAL HISTORY  2002   ear surgery   STAPEDES SURGERY Right 2002   Patient Active Problem List   Diagnosis Date Noted   Dizziness 10/02/2022   Lumbar facet joint pain 06/25/2022   Osteoarthritis of facet joint of lumbar spine 06/25/2022   Lumbosacral facet joint hypertrophy 06/25/2022   Parkinson's disease with dyskinesia (HCC) 01/27/2022   Dysphagia, pharyngeal phase 09/17/2021   Cervicalgia 08/05/2021    Spondylosis without myelopathy or radiculopathy, lumbosacral region 06/24/2021   Lumbar central spinal stenosis, w/o neurogenic claudication (L4-5) 06/03/2021   Lumbar lateral recess stenosis (Bilateral: L2-3, L4-5) (Right: L3-4) 06/03/2021   Lumbosacral foraminal stenosis (Bilateral: L2-3, L3-4) (Left: L5-S1) 06/03/2021   Lumbar nerve root impingement (Right: L3 at L2-3 & L3-4) 06/03/2021   Ligamentum flavum hypertrophy (L3-4, L4-5) 06/03/2021   Abnormal MRI, lumbar spine (04/23/2021) 04/24/2021   Long term prescription benzodiazepine use (alprazolam) (Xanax) 03/24/2021   Grade 1 Retrolisthesis of L2/L3 (5 mm) and L3/L4 (3 mm) 03/24/2021   Levoscoliosis of lumbar spine (L3-4 apex) 03/24/2021   DDD (degenerative disc disease), lumbosacral 03/24/2021   Lumbosacral facet arthropathy (Left: L3-4, L4-5, and L5-S1) 03/24/2021   Tricompartment osteoarthritis of knee (Left) 03/24/2021   Baker cyst (Left) 03/24/2021   Chronic low back pain (1ry area of Pain) (Bilateral) (R>L) w/o sciatica 03/24/2021   Lumbar facet syndrome (Bilateral) 03/24/2021   Chronic lower extremity pain (2ry area of Pain) (Bilateral) (L>R) 03/24/2021   Lumbosacral radiculitis/sensory radiculopathy at L2 (Bilateral) 03/24/2021   Lumbosacral radiculitis/sensory radiculopathy at L3 (Bilateral) 03/24/2021   Chronic pain syndrome 03/23/2021   Pharmacologic therapy 03/23/2021   Disorder of skeletal system 03/23/2021   Problems influencing health status 03/23/2021   PAD (peripheral artery disease) (HCC)  10/01/2020   GAD (generalized anxiety disorder) 01/13/2018   MDD (major depressive disorder) 01/13/2018   Umbilical hernia 09/04/2017   Chronic knee pain (Left) 08/11/2017   Derangement of medial meniscus, posterior horn (Left) 08/11/2017   Vitamin D insufficiency 03/05/2016   BPH without obstruction/lower urinary tract symptoms 07/08/2015   Chronic fatigue 10/23/2014   Major depression, recurrent, full remission (HCC)  07/26/2013   Unsteady gait 07/26/2013   Orthostatic hypotension 07/26/2013   Depression 07/26/2013   Anxiety 06/23/2013   Chronic anxiety 06/23/2013   Tremor 05/18/2013   Bilateral carotid artery stenosis 08/30/2012    PCP: Smitty Cords, DO  REFERRING PROVIDER:   Mecum, Oswaldo Conroy, PA-C    REFERRING DIAG: R42 (ICD-10-CM) - Dizziness   THERAPY DIAG:  Dizziness and giddiness  Difficulty in walking, not elsewhere classified  Unsteadiness on feet  Other low back pain  ONSET DATE: August 1st 2024  Rationale for Evaluation and Treatment: Rehabilitation  SUBJECTIVE:   SUBJECTIVE STATEMENT  Pt reports dizziness when he lays down and the light looks like it keeps moving. He reports feeling more dizzy lately.  Pt accompanied by: significant other   PERTINENT HISTORY:  The pt is a pleasant 82 yo male referred to PT for dizziness. Pt known to clinic, previously seen for falls in the setting of PD.  Pt spouse present for eval and reports pt was seen by neurologist recently at [facility] following onset of dizziness about two-three weeks ago. He reports they think he has BPPV.  PT spouse reports he had blood work done at this visit which "didn't show anything" and was normal. Pt unsure if his symptoms come on when he is still at rest. He describes his symptoms as spinning. This can be triggered when he lies on his back and also when he sits up from supine. Spinning lasts for minutes. He reports no nausea.  Pt sometimes sees double when he looks to the side and this has been going on for weeks. Denies any one sided weakness or other changes. He has fallen 4 times in past 3 months. Pt spouse reports pt had a fall about a week prior to dizziness onset. Pt reports no neck pain, has hx of LBP. His spouse reports decline in his mobility since this started. Pt uses RW at baseline for ambulation. Pt now being seen primarily for PD impairments. Pt PMH significant for Parkinson's disease,  concussion, cervicalgia, depression, seizures, chronic L knee pain, chronic anxiety, DDD, PAD, chronic pain syndrome, chronic low back pain, please refer to chart for full, extensive, PMH. PAIN:  Are you having pain? No Back pain hurting prior, took 3 ibuprophen.   PRECAUTIONS: Fall   WEIGHT BEARING RESTRICTIONS: No  FALLS: Has patient fallen in last 6 months? Yes. Number of falls 4  LIVING ENVIRONMENT: via chart and confirmed by pt in session Lives with: lives with their spouse Lives in: House/apartment Stairs: 4 Has following equipment at home: Walker - 2 wheeled  PLOF:  needs assistance, pt with PD that affected his mobility/ADLs  PATIENT GOALS: get rid of dizziness  OBJECTIVE:     TREATMENT:  DATE: 02/11/23  Gait belt donned for pt safety with upright activities, heat applied to low back while pt performing seated interventions (reports heat feels good, no adverse reaction to heat)  NMR:  Right Dix-Hallpike: positive, upbeat and torsional to R, <30 sec duration  Epley 1x Retest DH no nystagmus, no dizziness Epley 1x  Gait with vertical and horizontal head turns x several minutes - pt feeling slightly unsteady Comments: pt reports he "almost" feels dizzy throughout.  PT provided education on findings with DH and post-maneuver precautions. Education provided on benefit from seeking referral to ENT if symptoms continue  Long rest break taken following maneuvers   PATIENT EDUCATION: Education details: exercise technique Person education: patient, spouse  HOME EXERCISE PROGRAM: Continue as previously given: Access Code: [email or web address] URL: [email or web address] Date: 01/12/2023 Prepared by: Temple Pacini  Exercises - Seated Shoulder Flexion Full Range Single Arm  - 1 x daily - 7 x weekly - 2 sets - 10 reps  Access Code: [email or web address] URL:  [email or web address] Date: 12/08/2022 Prepared by: Temple Pacini  Exercises - Seated March  - 1 x daily - 5-6 x weekly - 4 sets - 10 reps - Seated Long Arc Quad  - 1 x daily - 5-6 x weekly - 2 sets - 15 reps - Seated Heel Toe Raises  - 1 x daily - 5-6 x weekly - 2 sets - 20 reps   Access Code: [email or web address] URL: [email or web address] Date: 12/24/2022 Prepared by: Tomasa Hose  Exercises - Standing Lumbar Extension at Wall - Forearms  - 1 x daily - 7 x weekly - 3 sets - 10 reps - Prone Press Up On Elbows  - 1 x daily - 7 x weekly - 3 sets - 10 reps - Seated Lumbar Extension  - 1 x daily - 7 x weekly - 3 sets - 10 reps  GOALS:   Goals reviewed with patient? Yes   SHORT TERM GOALS: Target date: 03/25/2023   Patient will be independent in home exercise program to improve dizziness, balance and mobility for better safety and functional independence with ADLs. Baseline: to be initiated future visit/as needed 11/12:  Goal status: IN PROGRESS    LONG TERM GOALS: Target date: 05/06/2023    Patient will increase FOTO score to equal to or greater than 53    to demonstrate statistically significant improvement in mobility and quality of life.  Baseline: 44; 01/12/23: 52  Goal status: PARTIALLY MET   2.  Patient will reduce dizziness handicap inventory score to <50, for less dizziness with ADLs and increased safety with home and work tasks.  Baseline: deferred, will complete as pt able: 10/10: 48, still has difficulty with bed mobility  Goal status: MET  3.  The pt will report at least a 60% decrease in positional or movement triggered dizziness symptoms in order to indicate decreased fall risk and increased balance/safety with mobility. Baseline: pt presents with decreased mobility due to dizziness, dizziness/vertigo triggered by particular positional; changes; 10/10: 60-70%  Goal status: MET  4.  Patient will reduce timed up and go to <11 seconds to reduce fall  risk and demonstrate improved transfer/gait ability. Baseline: 9/5: 18 sec; 10/10: 16 seconds seconds; 11/12: 17.5 sec with RW  Goal status: IN PROGRESS   5.Patient (> 51 years old) will complete five times sit to stand test in < 15 seconds indicating an increased LE strength and improved balance. Baseline: 12/01/22: 26 sec; 11/12: 22.5 sec hands-free  Goal status: IN PROGRESS  6. Patient will increase 10 meter walk test to >1.28m/s as to improve gait speed for better community ambulation and to reduce fall risk. Baseline: 12/01/22: 0.6 m/s with RW; 11/12:  0.95 m/s with RW  Goal status: PARTIALLY MET   ASSESSMENT:  CLINICAL IMPRESSION: Pt still exhibits positive DH with R side testing today, Epley tx provided, but PT did encourage pt to seek ENT referral for VNG testing as this is chronic issue and unclear if this is only peripheral in origin. PT educated pt on post-maneuver precautions. Pt will continue to benefit from skilled therapy to address remaining deficits in order to improve overall QoL and return to PLOF.      OBJECTIVE IMPAIRMENTS: Abnormal gait, decreased activity tolerance, decreased balance, decreased coordination, decreased mobility, difficulty walking, decreased strength, dizziness, impaired vision/preception, improper body mechanics, and postural dysfunction.   ACTIVITY LIMITATIONS: lifting, bending, squatting, stairs, transfers, bed mobility, toileting, dressing, hygiene/grooming, and locomotion level  PARTICIPATION LIMITATIONS: meal prep, cleaning, laundry, shopping, community activity, and yard work  PERSONAL FACTORS: Age, Fitness, and 3+ comorbidities: Pt PMH significant for Parkinson's disease, concussion, cervicalgia, depression, seizures, chronic L knee pain, chronic anxiety, DDD, PAD, chronic pain syndrome, chronic low back pain, please refer to chart for full, extensive, PMH.  are also affecting patient's functional outcome.   REHAB POTENTIAL: Good  CLINICAL  DECISION MAKING: Evolving/moderate complexity  EVALUATION COMPLEXITY: Moderate   PLAN:  PT FREQUENCY: 1-2x/week  PT DURATION: 12 weeks  PLANNED INTERVENTIONS: Therapeutic exercises, Therapeutic activity, Neuromuscular re-education, Balance training, Gait training, Patient/Family education, Self Care, Joint mobilization, Stair training, Vestibular training, Canalith repositioning, Visual/preceptual remediation/compensation, DME instructions, Electrical stimulation, Wheelchair mobility training, Spinal mobilization, Cryotherapy, Moist heat, Splintting, Taping, Traction, Manual therapy, and Re-evaluation  PLAN FOR NEXT SESSION: attempt standing/supported PD interventions, balance    Temple Pacini PT, DPT  Physical Therapist - [facility]  [facility]  02/11/23, 5:32 PM

## 2023-02-15 ENCOUNTER — Telehealth: Payer: Self-pay | Admitting: Psychiatry

## 2023-02-15 ENCOUNTER — Other Ambulatory Visit: Payer: Self-pay

## 2023-02-15 DIAGNOSIS — F411 Generalized anxiety disorder: Secondary | ICD-10-CM

## 2023-02-15 MED ORDER — ALPRAZOLAM 0.25 MG PO TABS: 0.2500 mg | ORAL_TABLET | Freq: Three times a day (TID) | ORAL | 0 refills | Status: DC | PRN

## 2023-02-15 NOTE — Telephone Encounter (Signed)
Pt's wife called in and LM to send in a RX for his Xanax. Can send it to [facility] in [place], [place].

## 2023-02-15 NOTE — Telephone Encounter (Signed)
PENDED XANAX 0.25 MG TO [facility] [place], [place]

## 2023-02-16 ENCOUNTER — Ambulatory Visit: Payer: Medicare HMO

## 2023-02-16 ENCOUNTER — Ambulatory Visit: Payer: Medicare HMO | Admitting: Physical Therapy

## 2023-02-16 DIAGNOSIS — R262 Difficulty in walking, not elsewhere classified: Secondary | ICD-10-CM | POA: Diagnosis not present

## 2023-02-16 DIAGNOSIS — R2681 Unsteadiness on feet: Secondary | ICD-10-CM

## 2023-02-16 DIAGNOSIS — R278 Other lack of coordination: Secondary | ICD-10-CM | POA: Diagnosis not present

## 2023-02-16 DIAGNOSIS — M5459 Other low back pain: Secondary | ICD-10-CM | POA: Diagnosis not present

## 2023-02-16 DIAGNOSIS — M6281 Muscle weakness (generalized): Secondary | ICD-10-CM

## 2023-02-16 DIAGNOSIS — G20C Parkinsonism, unspecified: Secondary | ICD-10-CM

## 2023-02-16 DIAGNOSIS — R42 Dizziness and giddiness: Secondary | ICD-10-CM | POA: Diagnosis not present

## 2023-02-16 NOTE — Therapy (Signed)
OUTPATIENT PHYSICAL THERAPY NEURO TREATMENT Patient Name: Joseph Arroyo MRN: [id number] DOB:1940-09-01, 82 y.o., male Today's Date: 02/16/2023  END OF SESSION:  PT End of Session - 02/16/23 1118     Visit Number 28    Number of Visits 33    Date for PT Re-Evaluation 02/23/23    Authorization Type [employer]    Progress Note Due on Visit 10    PT Start Time 1145    PT Stop Time 1226    PT Time Calculation (min) 41 min    Equipment Utilized During Treatment Gait belt    Activity Tolerance Patient tolerated treatment well;Patient limited by pain    Behavior During Therapy WFL for tasks assessed/performed                          Past Medical History:  Diagnosis Date   Allergic rhinitis due to pollen 11/21/2007   Brachial neuritis or radiculitis NOS    Cervicalgia    Concussion    age 36 - s/p accident   Costal chondritis    Depression    Essential and other specified forms of tremor    GERD (gastroesophageal reflux disease)    in past   Lumbago    Occlusion and stenosis of carotid artery without mention of cerebral infarction    Seizures (HCC)    age 50 - after concussion   Past Surgical History:  Procedure Laterality Date   CATARACT EXTRACTION Right 07/18/14   CATARACT EXTRACTION W/PHACO Left 08/01/2014   Procedure: CATARACT EXTRACTION PHACO AND INTRAOCULAR LENS PLACEMENT (IOC);  Surgeon: Lockie Mola, MD;  Location: [facility];  Service: Ophthalmology;  Laterality: Left;  IVA TOPICAL   COLONOSCOPY  2008   OTHER SURGICAL HISTORY  2002   ear surgery   STAPEDES SURGERY Right 2002   Patient Active Problem List   Diagnosis Date Noted   Dizziness 10/02/2022   Lumbar facet joint pain 06/25/2022   Osteoarthritis of facet joint of lumbar spine 06/25/2022   Lumbosacral facet joint hypertrophy 06/25/2022   Parkinson's disease with dyskinesia (HCC) 01/27/2022   Dysphagia, pharyngeal phase 09/17/2021   Cervicalgia 08/05/2021    Spondylosis without myelopathy or radiculopathy, lumbosacral region 06/24/2021   Lumbar central spinal stenosis, w/o neurogenic claudication (L4-5) 06/03/2021   Lumbar lateral recess stenosis (Bilateral: L2-3, L4-5) (Right: L3-4) 06/03/2021   Lumbosacral foraminal stenosis (Bilateral: L2-3, L3-4) (Left: L5-S1) 06/03/2021   Lumbar nerve root impingement (Right: L3 at L2-3 & L3-4) 06/03/2021   Ligamentum flavum hypertrophy (L3-4, L4-5) 06/03/2021   Abnormal MRI, lumbar spine (04/23/2021) 04/24/2021   Long term prescription benzodiazepine use (alprazolam) (Xanax) 03/24/2021   Grade 1 Retrolisthesis of L2/L3 (5 mm) and L3/L4 (3 mm) 03/24/2021   Levoscoliosis of lumbar spine (L3-4 apex) 03/24/2021   DDD (degenerative disc disease), lumbosacral 03/24/2021   Lumbosacral facet arthropathy (Left: L3-4, L4-5, and L5-S1) 03/24/2021   Tricompartment osteoarthritis of knee (Left) 03/24/2021   Baker cyst (Left) 03/24/2021   Chronic low back pain (1ry area of Pain) (Bilateral) (R>L) w/o sciatica 03/24/2021   Lumbar facet syndrome (Bilateral) 03/24/2021   Chronic lower extremity pain (2ry area of Pain) (Bilateral) (L>R) 03/24/2021   Lumbosacral radiculitis/sensory radiculopathy at L2 (Bilateral) 03/24/2021   Lumbosacral radiculitis/sensory radiculopathy at L3 (Bilateral) 03/24/2021   Chronic pain syndrome 03/23/2021   Pharmacologic therapy 03/23/2021   Disorder of skeletal system 03/23/2021   Problems influencing health status 03/23/2021   PAD (peripheral artery disease) (  HCC) 10/01/2020   GAD (generalized anxiety disorder) 01/13/2018   MDD (major depressive disorder) 01/13/2018   Umbilical hernia 09/04/2017   Chronic knee pain (Left) 08/11/2017   Derangement of medial meniscus, posterior horn (Left) 08/11/2017   Vitamin D insufficiency 03/05/2016   BPH without obstruction/lower urinary tract symptoms 07/08/2015   Chronic fatigue 10/23/2014   Major depression, recurrent, full remission (HCC)  07/26/2013   Unsteady gait 07/26/2013   Orthostatic hypotension 07/26/2013   Depression 07/26/2013   Anxiety 06/23/2013   Chronic anxiety 06/23/2013   Tremor 05/18/2013   Bilateral carotid artery stenosis 08/30/2012    PCP: Smitty Cords, DO  REFERRING PROVIDER:   Mecum, Oswaldo Conroy, PA-C    REFERRING DIAG: R42 (ICD-10-CM) - Dizziness   THERAPY DIAG:  Muscle weakness (generalized)  Atypical parkinsonism (HCC)  Unsteadiness on feet  Difficulty in walking, not elsewhere classified  ONSET DATE: August 1st 2024  Rationale for Evaluation and Treatment: Rehabilitation  SUBJECTIVE:   SUBJECTIVE STATEMENT  Pt reports continued back pain this date and rates pain 5/10 at start of session.   Pt accompanied by: significant other   PERTINENT HISTORY:  The pt is a pleasant 82 yo male referred to PT for dizziness. Pt known to clinic, previously seen for falls in the setting of PD.  Pt spouse present for eval and reports pt was seen by neurologist recently at [facility] following onset of dizziness about two-three weeks ago. He reports they think he has BPPV.  PT spouse reports he had blood work done at this visit which "didn't show anything" and was normal. Pt unsure if his symptoms come on when he is still at rest. He describes his symptoms as spinning. This can be triggered when he lies on his back and also when he sits up from supine. Spinning lasts for minutes. He reports no nausea.  Pt sometimes sees double when he looks to the side and this has been going on for weeks. Denies any one sided weakness or other changes. He has fallen 4 times in past 3 months. Pt spouse reports pt had a fall about a week prior to dizziness onset. Pt reports no neck pain, has hx of LBP. His spouse reports decline in his mobility since this started. Pt uses RW at baseline for ambulation. Pt now being seen primarily for PD impairments. Pt PMH significant for Parkinson's disease, concussion, cervicalgia,  depression, seizures, chronic L knee pain, chronic anxiety, DDD, PAD, chronic pain syndrome, chronic low back pain, please refer to chart for full, extensive, PMH. PAIN:  Are you having pain? No  6/10 back pain at this time .   PRECAUTIONS: Fall   WEIGHT BEARING RESTRICTIONS: No  FALLS: Has patient fallen in last 6 months? Yes. Number of falls 4  LIVING ENVIRONMENT: via chart and confirmed by pt in session Lives with: lives with their spouse Lives in: House/apartment Stairs: 4 Has following equipment at home: Walker - 2 wheeled  PLOF:  needs assistance, pt with PD that affected his mobility/ADLs  PATIENT GOALS: get rid of dizziness  OBJECTIVE:   TREATMENT:  DATE: 02/16/23  Pt seated in armchair with pillow doubled on his R and 1 pillow behind in chair with heat pad applied to low back region. (reports heat feels good, no adverse reaction to heat)  TE: Gait with RW 2 x 300 ft for endurance/LE strength wearing 2.5# aw each LE.    Seated running man - contralateral UE/LE elevation  -could not perform reciprocally   -x 5 ea side at a time x 3 rounds ea LE   LAQ 2 x 10 ea LE with 2.5# AW  Seated heel raises on incline with 2.5#AW 3 x 15 reps   STS with BUE shoulder abduction 3*5 reps  -increased LBP this date limited some interventions, pain would elevate with some interventions and rest seated break was provided following.   PATIENT EDUCATION: Education details: exercise technique Person education: patient, spouse  HOME EXERCISE PROGRAM: Continue as previously given: Access Code: [email or web address] URL: [email or web address] Date: 01/12/2023 Prepared by: Temple Pacini  Exercises - Seated Shoulder Flexion Full Range Single Arm  - 1 x daily - 7 x weekly - 2 sets - 10 reps  Access Code: [email or web address] URL: [email or web address] Date: 12/08/2022 Prepared by:  Temple Pacini  Exercises - Seated March  - 1 x daily - 5-6 x weekly - 4 sets - 10 reps - Seated Long Arc Quad  - 1 x daily - 5-6 x weekly - 2 sets - 15 reps - Seated Heel Toe Raises  - 1 x daily - 5-6 x weekly - 2 sets - 20 reps   Access Code: [email or web address] URL: [email or web address] Date: 12/24/2022 Prepared by: Tomasa Hose  Exercises - Standing Lumbar Extension at Wall - Forearms  - 1 x daily - 7 x weekly - 3 sets - 10 reps - Prone Press Up On Elbows  - 1 x daily - 7 x weekly - 3 sets - 10 reps - Seated Lumbar Extension  - 1 x daily - 7 x weekly - 3 sets - 10 reps  GOALS:   Goals reviewed with patient? Yes   SHORT TERM GOALS: Target date: 03/30/2023   Patient will be independent in home exercise program to improve dizziness, balance and mobility for better safety and functional independence with ADLs. Baseline: to be initiated future visit/as needed 11/12:  Goal status: IN PROGRESS    LONG TERM GOALS: Target date: 05/11/2023    Patient will increase FOTO score to equal to or greater than 53    to demonstrate statistically significant improvement in mobility and quality of life.  Baseline: 44; 01/12/23: 52  Goal status: PARTIALLY MET   2.  Patient will reduce dizziness handicap inventory score to <50, for less dizziness with ADLs and increased safety with home and work tasks.  Baseline: deferred, will complete as pt able: 10/10: 48, still has difficulty with bed mobility  Goal status: MET  3.  The pt will report at least a 60% decrease in positional or movement triggered dizziness symptoms in order to indicate decreased fall risk and increased balance/safety with mobility. Baseline: pt presents with decreased mobility due to dizziness, dizziness/vertigo triggered by particular positional; changes; 10/10: 60-70%  Goal status: MET  4.  Patient will reduce timed up and go to <11 seconds to reduce fall risk and demonstrate improved transfer/gait ability. Baseline: 9/5:  18 sec; 10/10: 16 seconds seconds; 11/12: 17.5 sec with RW  Goal status: IN PROGRESS   5.Patient (> 75 years old) will complete five times sit to stand test in <  15 seconds indicating an increased LE strength and improved balance. Baseline: 12/01/22: 26 sec; 11/12: 22.5 sec hands-free  Goal status: IN PROGRESS   6. Patient will increase 10 meter walk test to >1.35m/s as to improve gait speed for better community ambulation and to reduce fall risk. Baseline: 12/01/22: 0.6 m/s with RW; 11/12:  0.95 m/s with RW  Goal status: PARTIALLY MET   ASSESSMENT:  CLINICAL IMPRESSION:  Patient arrived with good motivation form completion of pt activities.  Pt continuing to experience LBP that brings some limitations to his interventions this date. Pt continues with LE strength and endurance training. Pt will continue to benefit from skilled physical therapy intervention to address impairments, improve QOL, and attain therapy goals.      OBJECTIVE IMPAIRMENTS: Abnormal gait, decreased activity tolerance, decreased balance, decreased coordination, decreased mobility, difficulty walking, decreased strength, dizziness, impaired vision/preception, improper body mechanics, and postural dysfunction.   ACTIVITY LIMITATIONS: lifting, bending, squatting, stairs, transfers, bed mobility, toileting, dressing, hygiene/grooming, and locomotion level  PARTICIPATION LIMITATIONS: meal prep, cleaning, laundry, shopping, community activity, and yard work  PERSONAL FACTORS: Age, Fitness, and 3+ comorbidities: Pt PMH significant for Parkinson's disease, concussion, cervicalgia, depression, seizures, chronic L knee pain, chronic anxiety, DDD, PAD, chronic pain syndrome, chronic low back pain, please refer to chart for full, extensive, PMH.  are also affecting patient's functional outcome.   REHAB POTENTIAL: Good  CLINICAL DECISION MAKING: Evolving/moderate complexity  EVALUATION COMPLEXITY: Moderate   PLAN:  PT  FREQUENCY: 1-2x/week  PT DURATION: 12 weeks  PLANNED INTERVENTIONS: Therapeutic exercises, Therapeutic activity, Neuromuscular re-education, Balance training, Gait training, Patient/Family education, Self Care, Joint mobilization, Stair training, Vestibular training, Canalith repositioning, Visual/preceptual remediation/compensation, DME instructions, Electrical stimulation, Wheelchair mobility training, Spinal mobilization, Cryotherapy, Moist heat, Splintting, Taping, Traction, Manual therapy, and Re-evaluation  PLAN FOR NEXT SESSION: attempt standing/supported PD interventions, balance  Norman Herrlich PT ,DPT Physical Therapist- [place]  [facility]   02/16/23, 11:19 AM

## 2023-02-16 NOTE — Therapy (Unsigned)
OUTPATIENT OCCUPATIONAL THERAPY NEURO TREATMENT NOTE  Patient Name: Joseph Arroyo MRN: [id number] DOB:08/22/1940, 82 y.o., male Today's Date: 02/16/2023  PCP: Dr. Saralyn Pilar REFERRING PROVIDER: PCP  END OF SESSION:  OT End of Session - 02/16/23 1118     Visit Number 17    Number of Visits 24    Date for OT Re-Evaluation 03/04/23    Progress Note Due on Visit 10    OT Start Time 1100    OT Stop Time 1145    OT Time Calculation (min) 45 min    Equipment Utilized During Treatment RW    Activity Tolerance Patient tolerated treatment well    Behavior During Therapy [facility] for tasks assessed/performed            Past Medical History:  Diagnosis Date   Allergic rhinitis due to pollen 11/21/2007   Brachial neuritis or radiculitis NOS    Cervicalgia    Concussion    age 39 - s/p accident   Costal chondritis    Depression    Essential and other specified forms of tremor    GERD (gastroesophageal reflux disease)    in past   Lumbago    Occlusion and stenosis of carotid artery without mention of cerebral infarction    Seizures [facility])    age 17 - after concussion   Past Surgical History:  Procedure Laterality Date   CATARACT EXTRACTION Right 07/18/14   CATARACT EXTRACTION W/PHACO Left 08/01/2014   Procedure: CATARACT EXTRACTION PHACO AND INTRAOCULAR LENS PLACEMENT (IOC);  Surgeon: Lockie Mola, MD;  Location: [facility];  Service: Ophthalmology;  Laterality: Left;  IVA TOPICAL   COLONOSCOPY  2008   OTHER SURGICAL HISTORY  2002   ear surgery   STAPEDES SURGERY Right 2002   Patient Active Problem List   Diagnosis Date Noted   Dizziness 10/02/2022   Lumbar facet joint pain 06/25/2022   Osteoarthritis of facet joint of lumbar spine 06/25/2022   Lumbosacral facet joint hypertrophy 06/25/2022   Parkinson's disease with dyskinesia (HCC) 01/27/2022   Dysphagia, pharyngeal phase 09/17/2021   Cervicalgia 08/05/2021   Spondylosis without myelopathy  or radiculopathy, lumbosacral region 06/24/2021   Lumbar central spinal stenosis, w/o neurogenic claudication (L4-5) 06/03/2021   Lumbar lateral recess stenosis (Bilateral: L2-3, L4-5) (Right: L3-4) 06/03/2021   Lumbosacral foraminal stenosis (Bilateral: L2-3, L3-4) (Left: L5-S1) 06/03/2021   Lumbar nerve root impingement (Right: L3 at L2-3 & L3-4) 06/03/2021   Ligamentum flavum hypertrophy (L3-4, L4-5) 06/03/2021   Abnormal MRI, lumbar spine (04/23/2021) 04/24/2021   Long term prescription benzodiazepine use (alprazolam) (Xanax) 03/24/2021   Grade 1 Retrolisthesis of L2/L3 (5 mm) and L3/L4 (3 mm) 03/24/2021   Levoscoliosis of lumbar spine (L3-4 apex) 03/24/2021   DDD (degenerative disc disease), lumbosacral 03/24/2021   Lumbosacral facet arthropathy (Left: L3-4, L4-5, and L5-S1) 03/24/2021   Tricompartment osteoarthritis of knee (Left) 03/24/2021   Baker cyst (Left) 03/24/2021   Chronic low back pain (1ry area of Pain) (Bilateral) (R>L) w/o sciatica 03/24/2021   Lumbar facet syndrome (Bilateral) 03/24/2021   Chronic lower extremity pain (2ry area of Pain) (Bilateral) (L>R) 03/24/2021   Lumbosacral radiculitis/sensory radiculopathy at L2 (Bilateral) 03/24/2021   Lumbosacral radiculitis/sensory radiculopathy at L3 (Bilateral) 03/24/2021   Chronic pain syndrome 03/23/2021   Pharmacologic therapy 03/23/2021   Disorder of skeletal system 03/23/2021   Problems influencing health status 03/23/2021   PAD (peripheral artery disease) (HCC) 10/01/2020   GAD (generalized anxiety disorder) 01/13/2018   MDD (major depressive disorder)  01/13/2018   Umbilical hernia 09/04/2017   Chronic knee pain (Left) 08/11/2017   Derangement of medial meniscus, posterior horn (Left) 08/11/2017   Vitamin D insufficiency 03/05/2016   BPH without obstruction/lower urinary tract symptoms 07/08/2015   Chronic fatigue 10/23/2014   Major depression, recurrent, full remission (HCC) 07/26/2013   Unsteady gait 07/26/2013    Orthostatic hypotension 07/26/2013   Depression 07/26/2013   Anxiety 06/23/2013   Chronic anxiety 06/23/2013   Tremor 05/18/2013   Bilateral carotid artery stenosis 08/30/2012   ONSET DATE: 2015  REFERRING DIAG: Parkinson's Disease  THERAPY DIAG:  Muscle weakness (generalized)  Other lack of coordination  Atypical parkinsonism (HCC)  Rationale for Evaluation and Treatment: Rehabilitation  SUBJECTIVE:  SUBJECTIVE STATEMENT: Pt reports some discomfort in his back today (~5/10), but spouse verbalized that he did take some ibuprofen before therapy today.  Pt accompanied by: significant other, spouse, [name]  PERTINENT HISTORY: Hx of Parkinson's disease (HCC); Chronic knee pain (Left); Derangement of medial meniscus, posterior horn (Left); PAD (peripheral artery disease) (HCC); Chronic pain syndrome; DDD (degenerative disc disease) lumbosacral; osteoarthritis of knee (Left); Chronic low back pain, Lumbar facet syndrome (Bilateral); Chronic lower extremity pain.   PRECAUTIONS: Fall  WEIGHT BEARING RESTRICTIONS: No  PAIN: 02/16/23: 5/10 pain low back Are you having pain? Yes: NPRS scale: 8-9/10 Pain location: low back  Pain description: aching, sometimes sharp  Aggravating factors: activity  Relieving factors: lying down, heat, ice pack  FALLS: Has patient fallen in last 6 months? Yes. Number of falls 4  LIVING ENVIRONMENT: Lives with: lives with their spouse Lives in: 1 level home Stairs: 4 steps, a landing, then 3 more steps with bilat rails.  Has following equipment at home: Dan Humphreys - 2 wheeled, Shower bench, bed side commode, and Grab bars   PLOF: Needs assistance with ADLs.  Pt performs basic self care with set up assist, extra time to manage clothing fasteners, occasional min A on days with worsening Parkinson's symptoms or back pain.    PATIENT GOALS: Pt wants to regain some strength and coordination skills in bilat hands.   OBJECTIVE:  Note: Objective measures were  completed at Evaluation unless otherwise noted.  HAND DOMINANCE: Right  ADLs: Overall ADLs: Extensive time required; occasional min A from spouse Transfers/ambulation related to ADLs: supv-modified indep with RW Eating: difficulty cutting food, struggles with loading food on a fork (uses built up foam handles at times); spouse cuts steak Grooming: electric toothbrush and standard toothbrush, but mostly uses electric; increased difficulty with brushing teeth and shaving (has electric trimmer and regular razor) UB Dressing: magnetic buttons on some shirts; more difficult to button shirts, occasional help with donning a shirt (min A)  LB Dressing: difficulty tying shoe laces, increased time; uses long shoe horn, has a hip kit; occasional assist to don socks  Toileting: modified indep/extra time  Bathing: able to take a shower with supv with tub bench Tub Shower transfers: transfer tub bench Equipment:  Pt does have hip kit and he regularly uses the long shoe horn.  Spouse reports pt may need some review with the other tools.   IADLs: Shopping: Spouse manages Light housekeeping: Spouse manages Meal Prep: Spouse manages Community mobility: Uses RW and requires frequent sitting breaks d/t back pain Medication management: modified Engineer, materials: Extra time for writing checks, with occasional assist from spouse on days when tremors are worse Handwriting: 90% legible, though pt reports this is an exceptional day today, and pt reports he's recently had to  rip of checks and have spouse take over d/t poor legibility.  MOBILITY STATUS: Hx of falls; spouse reports pt to have had 4 falls in the last 6 mo  POSTURE COMMENTS:  rounded shoulders, increased thoracic kyphosis, and hx of scoliosis , L shoulder rests significantly lower than R Sitting balance: Supports self with one extremity  ACTIVITY TOLERANCE: Activity tolerance: limited by low back pain   FUNCTIONAL OUTCOME  MEASURES: FOTO: TBD  (PT managing FOTO)  UPPER EXTREMITY ROM:  BUEs WFL  UPPER EXTREMITY MMT:     MMT Right eval Left eval  Shoulder flexion 5 5  Shoulder abduction 5 5  Shoulder adduction    Shoulder extension    Shoulder internal rotation 4 4  Shoulder external rotation 4 4  Middle trapezius    Lower trapezius    Elbow flexion    Elbow extension    Wrist flexion    Wrist extension    Wrist ulnar deviation    Wrist radial deviation    Wrist pronation    Wrist supination    (Blank rows = not tested)  HAND FUNCTION: Eval: Grip strength: Right: 30 lbs; Left: 45 lbs, Lateral pinch: Right: 12 lbs, Left: 5 lbs, and 3 point pinch: Right: 10 lbs, Left: 6 lbs 01/14/23: Grip strength: Right: 38 lbs; Left: 39 lbs, Lateral pinch: Right: 11 lbs, Left: 5 lbs, and 3 point pinch: Right: 9 lbs, Left: 5 lbs  COORDINATION: Eval: 9 Hole Peg test: Right: 33 sec; Left: 41 sec 01/14/23: 9 Hole Peg test: Right: 28 sec; Left: 29 sec  SENSATION: Not tested  EDEMA: No visible edema  MUSCLE TONE: RUE: Within functional limits and LUE: Within functional limits  COGNITION: Overall cognitive status: Within functional limits for tasks assessed; spouse reports some impulsivity/poor safety awareness contributing to falls at home  VISION: No recent changes; wears glasses for reading Visual history: macular degeneration, hx of cataract removal both eyes  PERCEPTION: Not tested  PRAXIS: Impaired: Motor planning  TODAY'S TREATMENT:                                                                                                                              DATE: 02/16/23 Moist heat applied to low back with support of lumbar pillow; a second pillow wedged between R side and arm rest of chair for midline position and back pain management throughout session.  Therapeutic Activity: Facilitated L/R FMC/dexterity skills, working to pick up and place Cabin crew.  Increased  challenge with pt working to store and manipulate 2 pieces in hand at a time.  Pt worked on modulating and a sustained lateral pinch using tweezers to disassemble spacers from pegs.  Further increased challenge with dual tasking; pt worked to assemble and disassemble pegboard pieces with added cognitive component of conversation while targeting [facility] skills.  Alternated between R/L hands throughout this activity.  PATIENT EDUCATION: Education details: FMC/dexterity skills Person educated: Patient  and Spouse Education method: Explanation, demo Education comprehension: verbalized understanding  HOME EXERCISE PROGRAM: Pt instructed to use his theraputty or his hand strengthener (spring based), [facility] exercises (handout issued), red theraband for bilat shoulder strengthening (ER/IR)  GOALS: Goals reviewed with patient? Yes  SHORT TERM GOALS: Target date: 01/21/23  Pt will perform HEP with supv for increasing strength and coordination in B hands.  Baseline: Eval: Will review previous HEP and add to as appropriate; 01/14/23: Pt using theraputty; added theraband today to target ER/IR strengthening at bilat shoulders Goal status: in progress   2.  Pt will implement fall prevention strategies with min vc from spouse at least 50% of the time.  Baseline: Spouse reports pt with 4 falls over the last 6 months, noting decreased safety awareness/impulsivity; 01/14/23: spouse reports inconsistent use of walker at times and pt walks in socks without grips.  Goal status: in progress  LONG TERM GOALS: Target date: 03/04/2023  Pt will increase FOTO score by (TBD) to indicate improvement in perceived functional performance with daily tasks. Baseline: Eval: FOTO to be given next session; 01/14/23: OT FOTO same as PT (PT managing) Goal status: in progress  2.  Pt will utilize AE/activity modification to participate in face time calls with family [place] with set up/supv. Baseline: Eval: Pt reports he is struggling  to accurately press buttons on his cell phone, often having spouse assist to manage a call (Initial recommendation at eval to transition to tablet for Face time calls to work with larger screen surface; spouse receptive); 03/15/22: No consistent changes implemented yet Goal status: in progress  3.  Pt will use adapted writing aids/utilize positioning strategies/activity modifications to enable pt to take simple notes when reading scripture and to write checks legibly. Baseline: Eval: Pt reports he has not been able to take notes in awhile d/t poor legibility and this task requiring increased effort and fatigue; 01/14/23: Pt tries to write checks as needed in the evening (more energy this time of day); variety of adapted pens have been tried; legibility inconsistent Goal status: in progress  4.  Pt will increase bilat grip strength by 5 or more lbs to improve ability to open containers.  Baseline: Eval: R grip: 30 lbs, L grip: 45 lbs; 01/14/23: R 38 lbs, L 39 lbs Goal status: in progress  5.  Pt will improve [facility] skills to increase efficiency with manipulation of eating and grooming utensils.   Baseline: Eval: R 33 sec, L 41 sec; 01/14/23: R 28 sec; L 29 sec Goal status: in progress  ASSESSMENT: CLINICAL IMPRESSION: Targeted FMC/dexterity skills in each hand this date, progressively increasing challenge with manipulation of [place] pegboard pieces.  Pt was able to work with 2 pieces in hand at a time using R hand, able to manipulate spacers and pegs with tweezers in either hand, and progressed to dual tasking by end of session.  Pt typically pauses with [facility] activities when engaging in conversation, but with vc, pt was able to perform simultaneously with brief pauses.  [facility] skills do slow down with conversation/dual tasking.  Pt continues to benefit from skilled OT to review/progress HEP, review/advise on activity modifications/AE, provide exercises and activities to promote hand strength and coordination  skills, and reinforce fall prevention strategies to maximize safety, efficiency, and independence with daily tasks.  Pt./spouse continue to be in agreement with poc.   PERFORMANCE DEFICITS: in functional skills including ADLs, IADLs, coordination, dexterity, strength, pain, Fine motor control, Gross motor control, mobility, balance, body  mechanics, endurance, decreased knowledge of use of DME, and UE functional use, cognitive skills including memory and safety awareness, and psychosocial skills including coping strategies, environmental adaptation, habits, and routines and behaviors.   IMPAIRMENTS: are limiting patient from ADLs, IADLs, leisure, and social participation.   CO-MORBIDITIES: has co-morbidities such as chronic pain syndrome, spinal stenosis, DDD, dizziness   that affects occupational performance. Patient will benefit from skilled OT to address above impairments and improve overall function.  MODIFICATION OR ASSISTANCE TO COMPLETE EVALUATION: No modification of tasks or assist necessary to complete an evaluation.  OT OCCUPATIONAL PROFILE AND HISTORY: Problem focused assessment: Including review of records relating to presenting problem.  CLINICAL DECISION MAKING: Moderate - several treatment options, min-mod task modification necessary  REHAB POTENTIAL: Good  EVALUATION COMPLEXITY: Low    PLAN:  OT FREQUENCY: 2x/week  OT DURATION: 12 weeks  PLANNED INTERVENTIONS: 97535 self care/ADL training, 16109 therapeutic exercise, 97530 therapeutic activity, 97112 neuromuscular re-education, 97010 moist heat, 97010 cryotherapy, passive range of motion, balance training, functional mobility training, visual/perceptual remediation/compensation, psychosocial skills training, energy conservation, coping strategies training, patient/family education, and DME and/or AE instructions  RECOMMENDED OTHER SERVICES: None at this time  CONSULTED AND AGREED WITH PLAN OF CARE: family  member/caregiver  PLAN FOR NEXT SESSION:   Danelle Earthly, MS, OTR/L 02/16/2023, 11:21 AM

## 2023-02-18 ENCOUNTER — Ambulatory Visit: Payer: Medicare HMO

## 2023-02-18 DIAGNOSIS — R262 Difficulty in walking, not elsewhere classified: Secondary | ICD-10-CM

## 2023-02-18 DIAGNOSIS — M6281 Muscle weakness (generalized): Secondary | ICD-10-CM

## 2023-02-18 DIAGNOSIS — M5459 Other low back pain: Secondary | ICD-10-CM

## 2023-02-18 DIAGNOSIS — G20C Parkinsonism, unspecified: Secondary | ICD-10-CM

## 2023-02-18 DIAGNOSIS — R42 Dizziness and giddiness: Secondary | ICD-10-CM | POA: Diagnosis not present

## 2023-02-18 DIAGNOSIS — R278 Other lack of coordination: Secondary | ICD-10-CM | POA: Diagnosis not present

## 2023-02-18 DIAGNOSIS — R2681 Unsteadiness on feet: Secondary | ICD-10-CM | POA: Diagnosis not present

## 2023-02-18 NOTE — Therapy (Signed)
OUTPATIENT PHYSICAL THERAPY NEURO TREATMENT Patient Name: Joseph Arroyo MRN: [id number] DOB:09-22-1940, 82 y.o., male Today's Date: 02/18/2023  END OF SESSION:  PT End of Session - 02/18/23 1314     Visit Number 29    Number of Visits 33    Date for PT Re-Evaluation 02/23/23    Authorization Type [employer]    Progress Note Due on Visit 10    PT Start Time 1318    PT Stop Time 1358    PT Time Calculation (min) 40 min    Equipment Utilized During Treatment Gait belt    Activity Tolerance Patient tolerated treatment well;Patient limited by pain    Behavior During Therapy WFL for tasks assessed/performed                          Past Medical History:  Diagnosis Date   Allergic rhinitis due to pollen 11/21/2007   Brachial neuritis or radiculitis NOS    Cervicalgia    Concussion    age 49 - s/p accident   Costal chondritis    Depression    Essential and other specified forms of tremor    GERD (gastroesophageal reflux disease)    in past   Lumbago    Occlusion and stenosis of carotid artery without mention of cerebral infarction    Seizures (HCC)    age 32 - after concussion   Past Surgical History:  Procedure Laterality Date   CATARACT EXTRACTION Right 07/18/14   CATARACT EXTRACTION W/PHACO Left 08/01/2014   Procedure: CATARACT EXTRACTION PHACO AND INTRAOCULAR LENS PLACEMENT (IOC);  Surgeon: Lockie Mola, MD;  Location: [facility];  Service: Ophthalmology;  Laterality: Left;  IVA TOPICAL   COLONOSCOPY  2008   OTHER SURGICAL HISTORY  2002   ear surgery   STAPEDES SURGERY Right 2002   Patient Active Problem List   Diagnosis Date Noted   Dizziness 10/02/2022   Lumbar facet joint pain 06/25/2022   Osteoarthritis of facet joint of lumbar spine 06/25/2022   Lumbosacral facet joint hypertrophy 06/25/2022   Parkinson's disease with dyskinesia (HCC) 01/27/2022   Dysphagia, pharyngeal phase 09/17/2021   Cervicalgia 08/05/2021    Spondylosis without myelopathy or radiculopathy, lumbosacral region 06/24/2021   Lumbar central spinal stenosis, w/o neurogenic claudication (L4-5) 06/03/2021   Lumbar lateral recess stenosis (Bilateral: L2-3, L4-5) (Right: L3-4) 06/03/2021   Lumbosacral foraminal stenosis (Bilateral: L2-3, L3-4) (Left: L5-S1) 06/03/2021   Lumbar nerve root impingement (Right: L3 at L2-3 & L3-4) 06/03/2021   Ligamentum flavum hypertrophy (L3-4, L4-5) 06/03/2021   Abnormal MRI, lumbar spine (04/23/2021) 04/24/2021   Long term prescription benzodiazepine use (alprazolam) (Xanax) 03/24/2021   Grade 1 Retrolisthesis of L2/L3 (5 mm) and L3/L4 (3 mm) 03/24/2021   Levoscoliosis of lumbar spine (L3-4 apex) 03/24/2021   DDD (degenerative disc disease), lumbosacral 03/24/2021   Lumbosacral facet arthropathy (Left: L3-4, L4-5, and L5-S1) 03/24/2021   Tricompartment osteoarthritis of knee (Left) 03/24/2021   Baker cyst (Left) 03/24/2021   Chronic low back pain (1ry area of Pain) (Bilateral) (R>L) w/o sciatica 03/24/2021   Lumbar facet syndrome (Bilateral) 03/24/2021   Chronic lower extremity pain (2ry area of Pain) (Bilateral) (L>R) 03/24/2021   Lumbosacral radiculitis/sensory radiculopathy at L2 (Bilateral) 03/24/2021   Lumbosacral radiculitis/sensory radiculopathy at L3 (Bilateral) 03/24/2021   Chronic pain syndrome 03/23/2021   Pharmacologic therapy 03/23/2021   Disorder of skeletal system 03/23/2021   Problems influencing health status 03/23/2021   PAD (peripheral artery disease) (  HCC) 10/01/2020   GAD (generalized anxiety disorder) 01/13/2018   MDD (major depressive disorder) 01/13/2018   Umbilical hernia 09/04/2017   Chronic knee pain (Left) 08/11/2017   Derangement of medial meniscus, posterior horn (Left) 08/11/2017   Vitamin D insufficiency 03/05/2016   BPH without obstruction/lower urinary tract symptoms 07/08/2015   Chronic fatigue 10/23/2014   Major depression, recurrent, full remission (HCC)  07/26/2013   Unsteady gait 07/26/2013   Orthostatic hypotension 07/26/2013   Depression 07/26/2013   Anxiety 06/23/2013   Chronic anxiety 06/23/2013   Tremor 05/18/2013   Bilateral carotid artery stenosis 08/30/2012    PCP: Smitty Cords, DO  REFERRING PROVIDER:   Mecum, Oswaldo Conroy, PA-C    REFERRING DIAG: R42 (ICD-10-CM) - Dizziness   THERAPY DIAG:  Unsteadiness on feet  Other lack of coordination  Muscle weakness (generalized)  Difficulty in walking, not elsewhere classified  Other low back pain  ONSET DATE: August 1st 2024  Rationale for Evaluation and Treatment: Rehabilitation  SUBJECTIVE:   SUBJECTIVE STATEMENT  Pt reports Epley maneuver seems to have worked, no more symptoms/dizziness with lying down. He does report his remaining symptoms include lightheadedness. Pt reports back pain is currently a 4/10.  Pt accompanied by: significant other   PERTINENT HISTORY:  The pt is a pleasant 82 yo male referred to PT for dizziness. Pt known to clinic, previously seen for falls in the setting of PD.  Pt spouse present for eval and reports pt was seen by neurologist recently at [facility] following onset of dizziness about two-three weeks ago. He reports they think he has BPPV.  PT spouse reports he had blood work done at this visit which "didn't show anything" and was normal. Pt unsure if his symptoms come on when he is still at rest. He describes his symptoms as spinning. This can be triggered when he lies on his back and also when he sits up from supine. Spinning lasts for minutes. He reports no nausea.  Pt sometimes sees double when he looks to the side and this has been going on for weeks. Denies any one sided weakness or other changes. He has fallen 4 times in past 3 months. Pt spouse reports pt had a fall about a week prior to dizziness onset. Pt reports no neck pain, has hx of LBP. His spouse reports decline in his mobility since this started. Pt uses RW at baseline  for ambulation. Pt now being seen primarily for PD impairments. Pt PMH significant for Parkinson's disease, concussion, cervicalgia, depression, seizures, chronic L knee pain, chronic anxiety, DDD, PAD, chronic pain syndrome, chronic low back pain, please refer to chart for full, extensive, PMH. PAIN:  Are you having pain? No  6/10 back pain at this time .   PRECAUTIONS: Fall   WEIGHT BEARING RESTRICTIONS: No  FALLS: Has patient fallen in last 6 months? Yes. Number of falls 4  LIVING ENVIRONMENT: via chart and confirmed by pt in session Lives with: lives with their spouse Lives in: House/apartment Stairs: 4 Has following equipment at home: Walker - 2 wheeled  PLOF:  needs assistance, pt with PD that affected his mobility/ADLs  PATIENT GOALS: get rid of dizziness  OBJECTIVE:   TREATMENT:  DATE: 02/18/23  Pt seated in armchair with pillow doubled on his R and 1 pillow behind in chair with heat pad applied to low back region. (reports heat feels good, no adverse reaction to heat)  TE:  Warm-up: Seated march 2x10 each LE  LAQ 2x10 each LE   Gait with RW 2x 296 ft for endurance/LE strength wearing 2.5# aw each LE.   -Two additional laps 160 ft with 2.5# after a rest break   NMR:  Large amplitude STS with BUE shoulder abduction 10 reps Standing balance at board spelling out letters with  CGA-min a   With 2.5# ankle weights each LE: Cooridnation intervention - alt UE raise/contralat LE march 2x10 Coordination intervention alt UE abduction with contralat LE LAQ 2x10  PATIENT EDUCATION: Education details: exercise technique Person education: patient, spouse  HOME EXERCISE PROGRAM: Continue as previously given: Access Code: [email or web address] URL: [email or web address] Date: 01/12/2023 Prepared by: Temple Pacini  Exercises - Seated Shoulder Flexion Full Range Single Arm   - 1 x daily - 7 x weekly - 2 sets - 10 reps  Access Code: [email or web address] URL: [email or web address] Date: 12/08/2022 Prepared by: Temple Pacini  Exercises - Seated March  - 1 x daily - 5-6 x weekly - 4 sets - 10 reps - Seated Long Arc Quad  - 1 x daily - 5-6 x weekly - 2 sets - 15 reps - Seated Heel Toe Raises  - 1 x daily - 5-6 x weekly - 2 sets - 20 reps   Access Code: [email or web address] URL: [email or web address] Date: 12/24/2022 Prepared by: Tomasa Hose  Exercises - Standing Lumbar Extension at Wall - Forearms  - 1 x daily - 7 x weekly - 3 sets - 10 reps - Prone Press Up On Elbows  - 1 x daily - 7 x weekly - 3 sets - 10 reps - Seated Lumbar Extension  - 1 x daily - 7 x weekly - 3 sets - 10 reps  GOALS:   Goals reviewed with patient? Yes   SHORT TERM GOALS: Target date: 04/01/2023   Patient will be independent in home exercise program to improve dizziness, balance and mobility for better safety and functional independence with ADLs. Baseline: to be initiated future visit/as needed 11/12:  Goal status: IN PROGRESS    LONG TERM GOALS: Target date: 05/13/2023    Patient will increase FOTO score to equal to or greater than 53    to demonstrate statistically significant improvement in mobility and quality of life.  Baseline: 44; 01/12/23: 52  Goal status: PARTIALLY MET   2.  Patient will reduce dizziness handicap inventory score to <50, for less dizziness with ADLs and increased safety with home and work tasks.  Baseline: deferred, will complete as pt able: 10/10: 49, still has difficulty with bed mobility  Goal status: MET  3.  The pt will report at least a 60% decrease in positional or movement triggered dizziness symptoms in order to indicate decreased fall risk and increased balance/safety with mobility. Baseline: pt presents with decreased mobility due to dizziness, dizziness/vertigo triggered by particular positional; changes; 10/10: 60-70%  Goal status:  MET  4.  Patient will reduce timed up and go to <11 seconds to reduce fall risk and demonstrate improved transfer/gait ability. Baseline: 9/5: 18 sec; 10/10: 16 seconds seconds; 11/12: 17.5 sec with RW  Goal status: IN PROGRESS   5.Patient (> 65 years old) will complete five times sit to stand test in < 15 seconds indicating an increased LE  strength and improved balance. Baseline: 12/01/22: 26 sec; 11/12: 22.5 sec hands-free  Goal status: IN PROGRESS   6. Patient will increase 10 meter walk test to >1.35m/s as to improve gait speed for better community ambulation and to reduce fall risk. Baseline: 12/01/22: 0.6 m/s with RW; 11/12:  0.95 m/s with RW  Goal status: PARTIALLY MET   ASSESSMENT:  CLINICAL IMPRESSION: Pt returns to therapy reporting improvement with dizziness since Epley maneuver, now just with lingering lightheaded symptoms (unclear if this is due to BP or other origin).  Pt tolerated interventions fair today without increase from baseline back pain and was able to complete multiple bouts of gait and standing balance activity. Pt will continue to benefit from skilled physical therapy intervention to address impairments, improve QOL, and attain therapy goals.      OBJECTIVE IMPAIRMENTS: Abnormal gait, decreased activity tolerance, decreased balance, decreased coordination, decreased mobility, difficulty walking, decreased strength, dizziness, impaired vision/preception, improper body mechanics, and postural dysfunction.   ACTIVITY LIMITATIONS: lifting, bending, squatting, stairs, transfers, bed mobility, toileting, dressing, hygiene/grooming, and locomotion level  PARTICIPATION LIMITATIONS: meal prep, cleaning, laundry, shopping, community activity, and yard work  PERSONAL FACTORS: Age, Fitness, and 3+ comorbidities: Pt PMH significant for Parkinson's disease, concussion, cervicalgia, depression, seizures, chronic L knee pain, chronic anxiety, DDD, PAD, chronic pain syndrome,  chronic low back pain, please refer to chart for full, extensive, PMH.  are also affecting patient's functional outcome.   REHAB POTENTIAL: Good  CLINICAL DECISION MAKING: Evolving/moderate complexity  EVALUATION COMPLEXITY: Moderate   PLAN:  PT FREQUENCY: 1-2x/week  PT DURATION: 12 weeks  PLANNED INTERVENTIONS: Therapeutic exercises, Therapeutic activity, Neuromuscular re-education, Balance training, Gait training, Patient/Family education, Self Care, Joint mobilization, Stair training, Vestibular training, Canalith repositioning, Visual/preceptual remediation/compensation, DME instructions, Electrical stimulation, Wheelchair mobility training, Spinal mobilization, Cryotherapy, Moist heat, Splintting, Taping, Traction, Manual therapy, and Re-evaluation  PLAN FOR NEXT SESSION: attempt standing/supported PD interventions, balance  Baird Kay PT ,DPT Physical Therapist- [facility]  [facility]   02/18/23, 3:03 PM

## 2023-02-18 NOTE — Therapy (Signed)
OUTPATIENT OCCUPATIONAL THERAPY NEURO TREATMENT NOTE  Patient Name: Joseph Arroyo MRN: [id number] DOB:[birth year], 82 y.o., male Today's Date: 02/18/2023  PCP: Dr. Saralyn Pilar REFERRING PROVIDER: PCP  END OF SESSION:  OT End of Session - 02/18/23 1432     Visit Number 18    Number of Visits 24    Date for OT Re-Evaluation 03/04/23    Progress Note Due on Visit 10    OT Start Time 1400    OT Stop Time 1445    OT Time Calculation (min) 45 min    Equipment Utilized During Treatment RW    Activity Tolerance Patient tolerated treatment well    Behavior During Therapy [facility] for tasks assessed/performed            Past Medical History:  Diagnosis Date   Allergic rhinitis due to pollen 11/21/2007   Brachial neuritis or radiculitis NOS    Cervicalgia    Concussion    age 25 - s/p accident   Costal chondritis    Depression    Essential and other specified forms of tremor    GERD (gastroesophageal reflux disease)    in past   Lumbago    Occlusion and stenosis of carotid artery without mention of cerebral infarction    Seizures [facility])    age 52 - after concussion   Past Surgical History:  Procedure Laterality Date   CATARACT EXTRACTION Right 07/18/14   CATARACT EXTRACTION W/PHACO Left 08/01/2014   Procedure: CATARACT EXTRACTION PHACO AND INTRAOCULAR LENS PLACEMENT (IOC);  Surgeon: Lockie Mola, MD;  Location: [facility];  Service: Ophthalmology;  Laterality: Left;  IVA TOPICAL   COLONOSCOPY  2008   OTHER SURGICAL HISTORY  2002   ear surgery   STAPEDES SURGERY Right 2002   Patient Active Problem List   Diagnosis Date Noted   Dizziness 10/02/2022   Lumbar facet joint pain 06/25/2022   Osteoarthritis of facet joint of lumbar spine 06/25/2022   Lumbosacral facet joint hypertrophy 06/25/2022   Parkinson's disease with dyskinesia (HCC) 01/27/2022   Dysphagia, pharyngeal phase 09/17/2021   Cervicalgia 08/05/2021   Spondylosis without myelopathy  or radiculopathy, lumbosacral region 06/24/2021   Lumbar central spinal stenosis, w/o neurogenic claudication (L4-5) 06/03/2021   Lumbar lateral recess stenosis (Bilateral: L2-3, L4-5) (Right: L3-4) 06/03/2021   Lumbosacral foraminal stenosis (Bilateral: L2-3, L3-4) (Left: L5-S1) 06/03/2021   Lumbar nerve root impingement (Right: L3 at L2-3 & L3-4) 06/03/2021   Ligamentum flavum hypertrophy (L3-4, L4-5) 06/03/2021   Abnormal MRI, lumbar spine (04/23/2021) 04/24/2021   Long term prescription benzodiazepine use (alprazolam) (Xanax) 03/24/2021   Grade 1 Retrolisthesis of L2/L3 (5 mm) and L3/L4 (3 mm) 03/24/2021   Levoscoliosis of lumbar spine (L3-4 apex) 03/24/2021   DDD (degenerative disc disease), lumbosacral 03/24/2021   Lumbosacral facet arthropathy (Left: L3-4, L4-5, and L5-S1) 03/24/2021   Tricompartment osteoarthritis of knee (Left) 03/24/2021   Baker cyst (Left) 03/24/2021   Chronic low back pain (1ry area of Pain) (Bilateral) (R>L) w/o sciatica 03/24/2021   Lumbar facet syndrome (Bilateral) 03/24/2021   Chronic lower extremity pain (2ry area of Pain) (Bilateral) (L>R) 03/24/2021   Lumbosacral radiculitis/sensory radiculopathy at L2 (Bilateral) 03/24/2021   Lumbosacral radiculitis/sensory radiculopathy at L3 (Bilateral) 03/24/2021   Chronic pain syndrome 03/23/2021   Pharmacologic therapy 03/23/2021   Disorder of skeletal system 03/23/2021   Problems influencing health status 03/23/2021   PAD (peripheral artery disease) (HCC) 10/01/2020   GAD (generalized anxiety disorder) 01/13/2018   MDD (major depressive disorder)  01/13/2018   Umbilical hernia 09/04/2017   Chronic knee pain (Left) 08/11/2017   Derangement of medial meniscus, posterior horn (Left) 08/11/2017   Vitamin D insufficiency 03/05/2016   BPH without obstruction/lower urinary tract symptoms 07/08/2015   Chronic fatigue 10/23/2014   Major depression, recurrent, full remission (HCC) 07/26/2013   Unsteady gait 07/26/2013    Orthostatic hypotension 07/26/2013   Depression 07/26/2013   Anxiety 06/23/2013   Chronic anxiety 06/23/2013   Tremor 05/18/2013   Bilateral carotid artery stenosis 08/30/2012   ONSET DATE: 2015  REFERRING DIAG: Parkinson's Disease  THERAPY DIAG:  Muscle weakness (generalized)  Other lack of coordination  Atypical parkinsonism (HCC)  Rationale for Evaluation and Treatment: Rehabilitation  SUBJECTIVE:  SUBJECTIVE STATEMENT: Pt reports doing well today. Pt accompanied by: significant other, spouse, [name]  PERTINENT HISTORY: Hx of Parkinson's disease (HCC); Chronic knee pain (Left); Derangement of medial meniscus, posterior horn (Left); PAD (peripheral artery disease) (HCC); Chronic pain syndrome; DDD (degenerative disc disease) lumbosacral; osteoarthritis of knee (Left); Chronic low back pain, Lumbar facet syndrome (Bilateral); Chronic lower extremity pain.   PRECAUTIONS: Fall  WEIGHT BEARING RESTRICTIONS: No  PAIN: 02/18/23: 5/10 pain low back Are you having pain? Yes: NPRS scale: 8-9/10 Pain location: low back  Pain description: aching, sometimes sharp  Aggravating factors: activity  Relieving factors: lying down, heat, ice pack  FALLS: Has patient fallen in last 6 months? Yes. Number of falls 4  LIVING ENVIRONMENT: Lives with: lives with their spouse Lives in: 1 level home Stairs: 4 steps, a landing, then 3 more steps with bilat rails.  Has following equipment at home: Dan Humphreys - 2 wheeled, Shower bench, bed side commode, and Grab bars   PLOF: Needs assistance with ADLs.  Pt performs basic self care with set up assist, extra time to manage clothing fasteners, occasional min A on days with worsening Parkinson's symptoms or back pain.    PATIENT GOALS: Pt wants to regain some strength and coordination skills in bilat hands.   OBJECTIVE:  Note: Objective measures were completed at Evaluation unless otherwise noted.  HAND DOMINANCE: Right  ADLs: Overall ADLs:  Extensive time required; occasional min A from spouse Transfers/ambulation related to ADLs: supv-modified indep with RW Eating: difficulty cutting food, struggles with loading food on a fork (uses built up foam handles at times); spouse cuts steak Grooming: electric toothbrush and standard toothbrush, but mostly uses electric; increased difficulty with brushing teeth and shaving (has electric trimmer and regular razor) UB Dressing: magnetic buttons on some shirts; more difficult to button shirts, occasional help with donning a shirt (min A)  LB Dressing: difficulty tying shoe laces, increased time; uses long shoe horn, has a hip kit; occasional assist to don socks  Toileting: modified indep/extra time  Bathing: able to take a shower with supv with tub bench Tub Shower transfers: transfer tub bench Equipment:  Pt does have hip kit and he regularly uses the long shoe horn.  Spouse reports pt may need some review with the other tools.   IADLs: Shopping: Spouse manages Light housekeeping: Spouse manages Meal Prep: Spouse manages Community mobility: Uses RW and requires frequent sitting breaks d/t back pain Medication management: modified Engineer, materials: Extra time for writing checks, with occasional assist from spouse on days when tremors are worse Handwriting: 90% legible, though pt reports this is an exceptional day today, and pt reports he's recently had to rip of checks and have spouse take over d/t poor legibility.  MOBILITY STATUS: Hx of falls;  spouse reports pt to have had 4 falls in the last 6 mo  POSTURE COMMENTS:  rounded shoulders, increased thoracic kyphosis, and hx of scoliosis , L shoulder rests significantly lower than R Sitting balance: Supports self with one extremity  ACTIVITY TOLERANCE: Activity tolerance: limited by low back pain   FUNCTIONAL OUTCOME MEASURES: FOTO: TBD  (PT managing FOTO)  UPPER EXTREMITY ROM:  BUEs WFL  UPPER EXTREMITY MMT:     MMT  Right eval Left eval  Shoulder flexion 5 5  Shoulder abduction 5 5  Shoulder adduction    Shoulder extension    Shoulder internal rotation 4 4  Shoulder external rotation 4 4  Middle trapezius    Lower trapezius    Elbow flexion    Elbow extension    Wrist flexion    Wrist extension    Wrist ulnar deviation    Wrist radial deviation    Wrist pronation    Wrist supination    (Blank rows = not tested)  HAND FUNCTION: Eval: Grip strength: Right: 30 lbs; Left: 45 lbs, Lateral pinch: Right: 12 lbs, Left: 5 lbs, and 3 point pinch: Right: 10 lbs, Left: 6 lbs 01/14/23: Grip strength: Right: 38 lbs; Left: 39 lbs, Lateral pinch: Right: 11 lbs, Left: 5 lbs, and 3 point pinch: Right: 9 lbs, Left: 5 lbs  COORDINATION: Eval: 9 Hole Peg test: Right: 33 sec; Left: 41 sec 01/14/23: 9 Hole Peg test: Right: 28 sec; Left: 29 sec  SENSATION: Not tested  EDEMA: No visible edema  MUSCLE TONE: RUE: Within functional limits and LUE: Within functional limits  COGNITION: Overall cognitive status: Within functional limits for tasks assessed; spouse reports some impulsivity/poor safety awareness contributing to falls at home  VISION: No recent changes; wears glasses for reading Visual history: macular degeneration, hx of cataract removal both eyes  PERCEPTION: Not tested  PRAXIS: Impaired: Motor planning  TODAY'S TREATMENT:                                                                                                                              DATE: 02/18/23 Moist heat applied to low back with support of lumbar pillow; a second pillow wedged between R side and arm rest of chair for midline position and back pain management throughout session.  Therapeutic Activity: Facilitated L/R FMC/dexterity skills, working to pick up and place Cabin crew.  Increased challenge with pt working to store and manipulate 2 pieces in hand at a time.  Pt worked on modulating and a  sustained lateral pinch using tweezers to assemble and disassemble pegs, spacers, and washers.  Further increased challenge with dual tasking; pt worked to assemble and disassemble pegboard pieces with added cognitive component of conversation while targeting [facility] skills.  Alternated between R/L hands throughout this activity.  PATIENT EDUCATION: Education details: FMC/dexterity skills Person educated: Patient and Spouse Education method: Explanation, demo Education comprehension: verbalized understanding  HOME EXERCISE PROGRAM:  Pt instructed to use his theraputty or his hand strengthener (spring based), [facility] exercises (handout issued), red theraband for bilat shoulder strengthening (ER/IR)  GOALS: Goals reviewed with patient? Yes  SHORT TERM GOALS: Target date: 01/21/23  Pt will perform HEP with supv for increasing strength and coordination in B hands.  Baseline: Eval: Will review previous HEP and add to as appropriate; 01/14/23: Pt using theraputty; added theraband today to target ER/IR strengthening at bilat shoulders Goal status: in progress   2.  Pt will implement fall prevention strategies with min vc from spouse at least 50% of the time.  Baseline: Spouse reports pt with 4 falls over the last 6 months, noting decreased safety awareness/impulsivity; 01/14/23: spouse reports inconsistent use of walker at times and pt walks in socks without grips.  Goal status: in progress  LONG TERM GOALS: Target date: 03/04/2023  Pt will increase FOTO score by (TBD) to indicate improvement in perceived functional performance with daily tasks. Baseline: Eval: FOTO to be given next session; 01/14/23: OT FOTO same as PT (PT managing) Goal status: in progress  2.  Pt will utilize AE/activity modification to participate in face time calls with family [place] with set up/supv. Baseline: Eval: Pt reports he is struggling to accurately press buttons on his cell phone, often having spouse assist to manage a  call (Initial recommendation at eval to transition to tablet for Face time calls to work with larger screen surface; spouse receptive); 03/15/22: No consistent changes implemented yet Goal status: in progress  3.  Pt will use adapted writing aids/utilize positioning strategies/activity modifications to enable pt to take simple notes when reading scripture and to write checks legibly. Baseline: Eval: Pt reports he has not been able to take notes in awhile d/t poor legibility and this task requiring increased effort and fatigue; 01/14/23: Pt tries to write checks as needed in the evening (more energy this time of day); variety of adapted pens have been tried; legibility inconsistent Goal status: in progress  4.  Pt will increase bilat grip strength by 5 or more lbs to improve ability to open containers.  Baseline: Eval: R grip: 30 lbs, L grip: 45 lbs; 01/14/23: R 38 lbs, L 39 lbs Goal status: in progress  5.  Pt will improve [facility] skills to increase efficiency with manipulation of eating and grooming utensils.   Baseline: Eval: R 33 sec, L 41 sec; 01/14/23: R 28 sec; L 29 sec Goal status: in progress  ASSESSMENT: CLINICAL IMPRESSION: Continued focus on FMC/dexterity skills in each hand this date, progressively increasing challenge with manipulation of [place] pegboard pieces.  Pt was able to work with 2 pieces in hand at a time using R hand, able to manipulate spacers, pegs, and washers with tweezers in either hand, and intermittent dual tasking challenges as noted above.  Pt typically pauses with [facility] activities when engaging in conversation, but with vc, pt was able to perform simultaneously with brief pauses.  [facility] skills do slow down with conversation/dual tasking.  Pt continues to benefit from skilled OT to review/progress HEP, review/advise on activity modifications/AE, provide exercises and activities to promote hand strength and coordination skills, and reinforce fall prevention strategies to  maximize safety, efficiency, and independence with daily tasks.  Pt./spouse continue to be in agreement with poc.   PERFORMANCE DEFICITS: in functional skills including ADLs, IADLs, coordination, dexterity, strength, pain, Fine motor control, Gross motor control, mobility, balance, body mechanics, endurance, decreased knowledge of use of DME, and UE functional use,  cognitive skills including memory and safety awareness, and psychosocial skills including coping strategies, environmental adaptation, habits, and routines and behaviors.   IMPAIRMENTS: are limiting patient from ADLs, IADLs, leisure, and social participation.   CO-MORBIDITIES: has co-morbidities such as chronic pain syndrome, spinal stenosis, DDD, dizziness   that affects occupational performance. Patient will benefit from skilled OT to address above impairments and improve overall function.  MODIFICATION OR ASSISTANCE TO COMPLETE EVALUATION: No modification of tasks or assist necessary to complete an evaluation.  OT OCCUPATIONAL PROFILE AND HISTORY: Problem focused assessment: Including review of records relating to presenting problem.  CLINICAL DECISION MAKING: Moderate - several treatment options, min-mod task modification necessary  REHAB POTENTIAL: Good  EVALUATION COMPLEXITY: Low    PLAN:  OT FREQUENCY: 2x/week  OT DURATION: 12 weeks  PLANNED INTERVENTIONS: 97535 self care/ADL training, 16109 therapeutic exercise, 97530 therapeutic activity, 97112 neuromuscular re-education, 97010 moist heat, 97010 cryotherapy, passive range of motion, balance training, functional mobility training, visual/perceptual remediation/compensation, psychosocial skills training, energy conservation, coping strategies training, patient/family education, and DME and/or AE instructions  RECOMMENDED OTHER SERVICES: None at this time  CONSULTED AND AGREED WITH PLAN OF CARE: family member/caregiver  PLAN FOR NEXT SESSION:   Danelle Earthly, MS,  OTR/L 02/18/2023, 2:36 PM

## 2023-02-23 ENCOUNTER — Ambulatory Visit: Payer: Medicare HMO

## 2023-02-23 DIAGNOSIS — R278 Other lack of coordination: Secondary | ICD-10-CM

## 2023-02-23 DIAGNOSIS — M6281 Muscle weakness (generalized): Secondary | ICD-10-CM | POA: Diagnosis not present

## 2023-02-23 DIAGNOSIS — R42 Dizziness and giddiness: Secondary | ICD-10-CM | POA: Diagnosis not present

## 2023-02-23 DIAGNOSIS — M5459 Other low back pain: Secondary | ICD-10-CM | POA: Diagnosis not present

## 2023-02-23 DIAGNOSIS — R2681 Unsteadiness on feet: Secondary | ICD-10-CM | POA: Diagnosis not present

## 2023-02-23 DIAGNOSIS — R262 Difficulty in walking, not elsewhere classified: Secondary | ICD-10-CM | POA: Diagnosis not present

## 2023-02-23 DIAGNOSIS — G20C Parkinsonism, unspecified: Secondary | ICD-10-CM | POA: Diagnosis not present

## 2023-02-23 NOTE — Therapy (Signed)
OUTPATIENT PHYSICAL THERAPY NEURO TREATMENT/Physical Therapy Progress Note   Dates of reporting period  01/12/2023   to   02/23/2023  Patient Name: Joseph Arroyo MRN: [id number] DOB:[birth year], 82 y.o., male Today's Date: 02/23/2023  END OF SESSION:  PT End of Session - 02/23/23 1012     Visit Number 30    Number of Visits 33    Date for PT Re-Evaluation 02/23/23    Authorization Type [employer]    Progress Note Due on Visit 10    PT Start Time 1016    PT Stop Time 1059    PT Time Calculation (min) 43 min    Equipment Utilized During Treatment Gait belt    Activity Tolerance Patient tolerated treatment well;Patient limited by pain    Behavior During Therapy WFL for tasks assessed/performed                          Past Medical History:  Diagnosis Date   Allergic rhinitis due to pollen 11/21/2007   Brachial neuritis or radiculitis NOS    Cervicalgia    Concussion    age 10 - s/p accident   Costal chondritis    Depression    Essential and other specified forms of tremor    GERD (gastroesophageal reflux disease)    in past   Lumbago    Occlusion and stenosis of carotid artery without mention of cerebral infarction    Seizures (HCC)    age 63 - after concussion   Past Surgical History:  Procedure Laterality Date   CATARACT EXTRACTION Right 07/18/14   CATARACT EXTRACTION W/PHACO Left 08/01/2014   Procedure: CATARACT EXTRACTION PHACO AND INTRAOCULAR LENS PLACEMENT (IOC);  Surgeon: Lockie Mola, MD;  Location: [facility];  Service: Ophthalmology;  Laterality: Left;  IVA TOPICAL   COLONOSCOPY  2008   OTHER SURGICAL HISTORY  2002   ear surgery   STAPEDES SURGERY Right 2002   Patient Active Problem List   Diagnosis Date Noted   Dizziness 10/02/2022   Lumbar facet joint pain 06/25/2022   Osteoarthritis of facet joint of lumbar spine 06/25/2022   Lumbosacral facet joint hypertrophy 06/25/2022   Parkinson's disease with  dyskinesia (HCC) 01/27/2022   Dysphagia, pharyngeal phase 09/17/2021   Cervicalgia 08/05/2021   Spondylosis without myelopathy or radiculopathy, lumbosacral region 06/24/2021   Lumbar central spinal stenosis, w/o neurogenic claudication (L4-5) 06/03/2021   Lumbar lateral recess stenosis (Bilateral: L2-3, L4-5) (Right: L3-4) 06/03/2021   Lumbosacral foraminal stenosis (Bilateral: L2-3, L3-4) (Left: L5-S1) 06/03/2021   Lumbar nerve root impingement (Right: L3 at L2-3 & L3-4) 06/03/2021   Ligamentum flavum hypertrophy (L3-4, L4-5) 06/03/2021   Abnormal MRI, lumbar spine (04/23/2021) 04/24/2021   Long term prescription benzodiazepine use (alprazolam) (Xanax) 03/24/2021   Grade 1 Retrolisthesis of L2/L3 (5 mm) and L3/L4 (3 mm) 03/24/2021   Levoscoliosis of lumbar spine (L3-4 apex) 03/24/2021   DDD (degenerative disc disease), lumbosacral 03/24/2021   Lumbosacral facet arthropathy (Left: L3-4, L4-5, and L5-S1) 03/24/2021   Tricompartment osteoarthritis of knee (Left) 03/24/2021   Baker cyst (Left) 03/24/2021   Chronic low back pain (1ry area of Pain) (Bilateral) (R>L) w/o sciatica 03/24/2021   Lumbar facet syndrome (Bilateral) 03/24/2021   Chronic lower extremity pain (2ry area of Pain) (Bilateral) (L>R) 03/24/2021   Lumbosacral radiculitis/sensory radiculopathy at L2 (Bilateral) 03/24/2021   Lumbosacral radiculitis/sensory radiculopathy at L3 (Bilateral) 03/24/2021   Chronic pain syndrome 03/23/2021   Pharmacologic therapy 03/23/2021  Disorder of skeletal system 03/23/2021   Problems influencing health status 03/23/2021   PAD (peripheral artery disease) (HCC) 10/01/2020   GAD (generalized anxiety disorder) 01/13/2018   MDD (major depressive disorder) 01/13/2018   Umbilical hernia 09/04/2017   Chronic knee pain (Left) 08/11/2017   Derangement of medial meniscus, posterior horn (Left) 08/11/2017   Vitamin D insufficiency 03/05/2016   BPH without obstruction/lower urinary tract symptoms  07/08/2015   Chronic fatigue 10/23/2014   Major depression, recurrent, full remission (HCC) 07/26/2013   Unsteady gait 07/26/2013   Orthostatic hypotension 07/26/2013   Depression 07/26/2013   Anxiety 06/23/2013   Chronic anxiety 06/23/2013   Tremor 05/18/2013   Bilateral carotid artery stenosis 08/30/2012    PCP: Smitty Cords, DO  REFERRING PROVIDER:   Mecum, Oswaldo Conroy, PA-C    REFERRING DIAG: R42 (ICD-10-CM) - Dizziness   THERAPY DIAG:  Muscle weakness (generalized)  Difficulty in walking, not elsewhere classified  Unsteadiness on feet  Dizziness and giddiness  Other low back pain  ONSET DATE: August 1st 2024  Rationale for Evaluation and Treatment: Rehabilitation  SUBJECTIVE:   SUBJECTIVE STATEMENT Pt reports vertigo has not returned. Pt reports he is a little less steady, however. He is being careful with ambulating. He feels a little swimmy-headed at times. He is unsure when it started.  Pt accompanied by: significant other   PERTINENT HISTORY:  The pt is a pleasant 82 yo male referred to PT for dizziness. Pt known to clinic, previously seen for falls in the setting of PD.  Pt spouse present for eval and reports pt was seen by neurologist recently at [facility] following onset of dizziness about two-three weeks ago. He reports they think he has BPPV.  PT spouse reports he had blood work done at this visit which "didn't show anything" and was normal. Pt unsure if his symptoms come on when he is still at rest. He describes his symptoms as spinning. This can be triggered when he lies on his back and also when he sits up from supine. Spinning lasts for minutes. He reports no nausea.  Pt sometimes sees double when he looks to the side and this has been going on for weeks. Denies any one sided weakness or other changes. He has fallen 4 times in past 3 months. Pt spouse reports pt had a fall about a week prior to dizziness onset. Pt reports no neck pain, has hx of LBP.  His spouse reports decline in his mobility since this started. Pt uses RW at baseline for ambulation. Pt now being seen primarily for PD impairments. Pt PMH significant for Parkinson's disease, concussion, cervicalgia, depression, seizures, chronic L knee pain, chronic anxiety, DDD, PAD, chronic pain syndrome, chronic low back pain, please refer to chart for full, extensive, PMH. PAIN:  Are you having pain? No  6/10 back pain at this time .   PRECAUTIONS: Fall   WEIGHT BEARING RESTRICTIONS: No  FALLS: Has patient fallen in last 6 months? Yes. Number of falls 4  LIVING ENVIRONMENT: via chart and confirmed by pt in session Lives with: lives with their spouse Lives in: House/apartment Stairs: 4 Has following equipment at home: Walker - 2 wheeled  PLOF:  needs assistance, pt with PD that affected his mobility/ADLs  PATIENT GOALS: get rid of dizziness  OBJECTIVE:   TREATMENT:  DATE: 02/23/23  Pt seated in armchair with pillow doubled on his R and 1 pillow behind in chair with heat pad applied to low back region. (reports heat feels good, no adverse reaction to heat)   TA: Goal reassessment completed for progress visit. Please refer to goal section/assessment below for details.  NMR: Ambulating with vertical and horizontal head turns down long hallway (80 ft) with RW x multiple rounds  - no dizziness with vertical head turns, but with horizontal head turns pt feels swimmy-headed. Improves with rest.  Seated EC vertical and horizontal head turns 10x for each - no symptoms    Standing with horizontal head turns EO to EC with UE support in bouts of 30 sec - improves with reps, pt performed at slow and fast speed with cues  Standing with vertical head turn EO and EC no symptoms 10x for each - no symptoms    TE: STS 3x5   2# aw each LE LAQ 2x15, 1x10 each LE - Rates medium Seated  march 2x15 each LE     PATIENT EDUCATION: Education details: exercise technique Person education: patient, spouse  HOME EXERCISE PROGRAM: Continue as previously given: Access Code: [email or web address] URL: [email or web address] Date: 01/12/2023 Prepared by: Temple Pacini  Exercises - Seated Shoulder Flexion Full Range Single Arm  - 1 x daily - 7 x weekly - 2 sets - 10 reps  Access Code: [email or web address] URL: [email or web address] Date: 12/08/2022 Prepared by: Temple Pacini  Exercises - Seated March  - 1 x daily - 5-6 x weekly - 4 sets - 10 reps - Seated Long Arc Quad  - 1 x daily - 5-6 x weekly - 2 sets - 15 reps - Seated Heel Toe Raises  - 1 x daily - 5-6 x weekly - 2 sets - 20 reps   Access Code: [email or web address] URL: [email or web address] Date: 12/24/2022 Prepared by: Tomasa Hose  Exercises - Standing Lumbar Extension at Wall - Forearms  - 1 x daily - 7 x weekly - 3 sets - 10 reps - Prone Press Up On Elbows  - 1 x daily - 7 x weekly - 3 sets - 10 reps - Seated Lumbar Extension  - 1 x daily - 7 x weekly - 3 sets - 10 reps  GOALS:   Goals reviewed with patient? Yes   SHORT TERM GOALS: Target date: 04/06/2023   Patient will be independent in home exercise program to improve dizziness, balance and mobility for better safety and functional independence with ADLs. Baseline: to be initiated future visit/as needed 11/12:  Goal status: IN PROGRESS    LONG TERM GOALS: Target date: 05/18/2023    Patient will increase FOTO score to equal to or greater than 53   to demonstrate statistically significant improvement in mobility and quality of life.  Baseline: 44; 01/12/23: 52; 12/24: 38 Goal status: PARTIALLY MET   2.  Patient will reduce dizziness handicap inventory score to <50, for less dizziness with ADLs and increased safety with home and work tasks.  Baseline: deferred, will complete as pt able: 10/10: 48, still has difficulty with bed mobility  Goal  status: MET  3.  The pt will report at least a 60% decrease in positional or movement triggered dizziness symptoms in order to indicate decreased fall risk and increased balance/safety with mobility. Baseline: pt presents with decreased mobility due to dizziness, dizziness/vertigo triggered by particular positional; changes; 10/10: 60-70%  Goal status: MET  4.  Patient will reduce timed up and go to <  11 seconds to reduce fall risk and demonstrate improved transfer/gait ability. Baseline: 9/5: 18 sec; 10/10: 16 seconds seconds; 11/12: 17.5 sec with RW; 12/24: 13 seconds Goal status: IN PROGRESS   5.Patient (> 35 years old) will complete five times sit to stand test in < 15 seconds indicating an increased LE strength and improved balance. Baseline: 12/01/22: 26 sec; 11/12: 22.5 sec hands-free; 12/24: 13 seconds hands-free Goal status: MET  6. Patient will increase 10 meter walk test to >1.57m/s as to improve gait speed for better community ambulation and to reduce fall risk. Baseline: 12/01/22: 0.6 m/s with RW; 11/12:  0.95 m/s with RW ; 02/23/23: 1.05 m/s with RW  Goal status: PARTIALLY MET   ASSESSMENT:  CLINICAL IMPRESSION: Goal reassessment completed for progress visit. Pt with decrease in FOTO score compared to prior assessment, possibly impacted by misinterpreting questions. Pt did make gains on other testing by meeting 5xSTS goal and improving both gait speed and TUG scores. This indicates decreased fall risk, improved gait speed and strength, blanace.  Pt will continue to benefit from skilled physical therapy intervention to address impairments, improve QOL, and attain therapy goals.      OBJECTIVE IMPAIRMENTS: Abnormal gait, decreased activity tolerance, decreased balance, decreased coordination, decreased mobility, difficulty walking, decreased strength, dizziness, impaired vision/preception, improper body mechanics, and postural dysfunction.   ACTIVITY LIMITATIONS: lifting,  bending, squatting, stairs, transfers, bed mobility, toileting, dressing, hygiene/grooming, and locomotion level  PARTICIPATION LIMITATIONS: meal prep, cleaning, laundry, shopping, community activity, and yard work  PERSONAL FACTORS: Age, Fitness, and 3+ comorbidities: Pt PMH significant for Parkinson's disease, concussion, cervicalgia, depression, seizures, chronic L knee pain, chronic anxiety, DDD, PAD, chronic pain syndrome, chronic low back pain, please refer to chart for full, extensive, PMH.  are also affecting patient's functional outcome.   REHAB POTENTIAL: Good  CLINICAL DECISION MAKING: Evolving/moderate complexity  EVALUATION COMPLEXITY: Moderate   PLAN:  PT FREQUENCY: 1-2x/week  PT DURATION: 12 weeks  PLANNED INTERVENTIONS: Therapeutic exercises, Therapeutic activity, Neuromuscular re-education, Balance training, Gait training, Patient/Family education, Self Care, Joint mobilization, Stair training, Vestibular training, Canalith repositioning, Visual/preceptual remediation/compensation, DME instructions, Electrical stimulation, Wheelchair mobility training, Spinal mobilization, Cryotherapy, Moist heat, Splintting, Taping, Traction, Manual therapy, and Re-evaluation  PLAN FOR NEXT SESSION: attempt standing/supported PD interventions, balance  Baird Kay PT ,DPT Physical Therapist- [facility]  [facility]   02/23/23, 12:01 PM

## 2023-02-23 NOTE — Therapy (Signed)
OUTPATIENT OCCUPATIONAL THERAPY NEURO TREATMENT NOTE  Patient Name: Joseph Arroyo MRN: [id number] DOB:[birth year], 82 y.o., male Today's Date: 02/23/2023  PCP: Dr. Saralyn Pilar REFERRING PROVIDER: PCP  END OF SESSION:  OT End of Session - 02/23/23 1222     Visit Number 19    Number of Visits 24    Date for OT Re-Evaluation 03/04/23    Progress Note Due on Visit 10    OT Start Time 1100    OT Stop Time 1145    OT Time Calculation (min) 45 min    Equipment Utilized During Treatment RW    Activity Tolerance Patient tolerated treatment well    Behavior During Therapy [facility] for tasks assessed/performed            Past Medical History:  Diagnosis Date   Allergic rhinitis due to pollen 11/21/2007   Brachial neuritis or radiculitis NOS    Cervicalgia    Concussion    age 72 - s/p accident   Costal chondritis    Depression    Essential and other specified forms of tremor    GERD (gastroesophageal reflux disease)    in past   Lumbago    Occlusion and stenosis of carotid artery without mention of cerebral infarction    Seizures [facility])    age 24 - after concussion   Past Surgical History:  Procedure Laterality Date   CATARACT EXTRACTION Right 07/18/14   CATARACT EXTRACTION W/PHACO Left 08/01/2014   Procedure: CATARACT EXTRACTION PHACO AND INTRAOCULAR LENS PLACEMENT (IOC);  Surgeon: Lockie Mola, MD;  Location: [facility];  Service: Ophthalmology;  Laterality: Left;  IVA TOPICAL   COLONOSCOPY  2008   OTHER SURGICAL HISTORY  2002   ear surgery   STAPEDES SURGERY Right 2002   Patient Active Problem List   Diagnosis Date Noted   Dizziness 10/02/2022   Lumbar facet joint pain 06/25/2022   Osteoarthritis of facet joint of lumbar spine 06/25/2022   Lumbosacral facet joint hypertrophy 06/25/2022   Parkinson's disease with dyskinesia (HCC) 01/27/2022   Dysphagia, pharyngeal phase 09/17/2021   Cervicalgia 08/05/2021   Spondylosis without myelopathy  or radiculopathy, lumbosacral region 06/24/2021   Lumbar central spinal stenosis, w/o neurogenic claudication (L4-5) 06/03/2021   Lumbar lateral recess stenosis (Bilateral: L2-3, L4-5) (Right: L3-4) 06/03/2021   Lumbosacral foraminal stenosis (Bilateral: L2-3, L3-4) (Left: L5-S1) 06/03/2021   Lumbar nerve root impingement (Right: L3 at L2-3 & L3-4) 06/03/2021   Ligamentum flavum hypertrophy (L3-4, L4-5) 06/03/2021   Abnormal MRI, lumbar spine (04/23/2021) 04/24/2021   Long term prescription benzodiazepine use (alprazolam) (Xanax) 03/24/2021   Grade 1 Retrolisthesis of L2/L3 (5 mm) and L3/L4 (3 mm) 03/24/2021   Levoscoliosis of lumbar spine (L3-4 apex) 03/24/2021   DDD (degenerative disc disease), lumbosacral 03/24/2021   Lumbosacral facet arthropathy (Left: L3-4, L4-5, and L5-S1) 03/24/2021   Tricompartment osteoarthritis of knee (Left) 03/24/2021   Baker cyst (Left) 03/24/2021   Chronic low back pain (1ry area of Pain) (Bilateral) (R>L) w/o sciatica 03/24/2021   Lumbar facet syndrome (Bilateral) 03/24/2021   Chronic lower extremity pain (2ry area of Pain) (Bilateral) (L>R) 03/24/2021   Lumbosacral radiculitis/sensory radiculopathy at L2 (Bilateral) 03/24/2021   Lumbosacral radiculitis/sensory radiculopathy at L3 (Bilateral) 03/24/2021   Chronic pain syndrome 03/23/2021   Pharmacologic therapy 03/23/2021   Disorder of skeletal system 03/23/2021   Problems influencing health status 03/23/2021   PAD (peripheral artery disease) (HCC) 10/01/2020   GAD (generalized anxiety disorder) 01/13/2018   MDD (major depressive disorder)  01/13/2018   Umbilical hernia 09/04/2017   Chronic knee pain (Left) 08/11/2017   Derangement of medial meniscus, posterior horn (Left) 08/11/2017   Vitamin D insufficiency 03/05/2016   BPH without obstruction/lower urinary tract symptoms 07/08/2015   Chronic fatigue 10/23/2014   Major depression, recurrent, full remission (HCC) 07/26/2013   Unsteady gait 07/26/2013    Orthostatic hypotension 07/26/2013   Depression 07/26/2013   Anxiety 06/23/2013   Chronic anxiety 06/23/2013   Tremor 05/18/2013   Bilateral carotid artery stenosis 08/30/2012   ONSET DATE: 2015  REFERRING DIAG: Parkinson's Disease  THERAPY DIAG:  Muscle weakness (generalized)  Other lack of coordination  Atypical parkinsonism (HCC)  Rationale for Evaluation and Treatment: Rehabilitation  SUBJECTIVE:  SUBJECTIVE STATEMENT: Pt reports spouse was unable to accompany him today d/t having issues with her retina. Pt accompanied by: family member, son-in-law, Theron Arista  PERTINENT HISTORY: Hx of Parkinson's disease (HCC); Chronic knee pain (Left); Derangement of medial meniscus, posterior horn (Left); PAD (peripheral artery disease) (HCC); Chronic pain syndrome; DDD (degenerative disc disease) lumbosacral; osteoarthritis of knee (Left); Chronic low back pain, Lumbar facet syndrome (Bilateral); Chronic lower extremity pain.   PRECAUTIONS: Fall  WEIGHT BEARING RESTRICTIONS: No  PAIN: 02/23/23: 4/10 pain low back Are you having pain? Yes: NPRS scale: 8-9/10 Pain location: low back  Pain description: aching, sometimes sharp  Aggravating factors: activity  Relieving factors: lying down, heat, ice pack  FALLS: Has patient fallen in last 6 months? Yes. Number of falls 4  LIVING ENVIRONMENT: Lives with: lives with their spouse Lives in: 1 level home Stairs: 4 steps, a landing, then 3 more steps with bilat rails.  Has following equipment at home: Dan Humphreys - 2 wheeled, Shower bench, bed side commode, and Grab bars   PLOF: Needs assistance with ADLs.  Pt performs basic self care with set up assist, extra time to manage clothing fasteners, occasional min A on days with worsening Parkinson's symptoms or back pain.    PATIENT GOALS: Pt wants to regain some strength and coordination skills in bilat hands.   OBJECTIVE:  Note: Objective measures were completed at Evaluation unless otherwise  noted.  HAND DOMINANCE: Right  ADLs: Overall ADLs: Extensive time required; occasional min A from spouse Transfers/ambulation related to ADLs: supv-modified indep with RW Eating: difficulty cutting food, struggles with loading food on a fork (uses built up foam handles at times); spouse cuts steak Grooming: electric toothbrush and standard toothbrush, but mostly uses electric; increased difficulty with brushing teeth and shaving (has electric trimmer and regular razor) UB Dressing: magnetic buttons on some shirts; more difficult to button shirts, occasional help with donning a shirt (min A)  LB Dressing: difficulty tying shoe laces, increased time; uses long shoe horn, has a hip kit; occasional assist to don socks  Toileting: modified indep/extra time  Bathing: able to take a shower with supv with tub bench Tub Shower transfers: transfer tub bench Equipment:  Pt does have hip kit and he regularly uses the long shoe horn.  Spouse reports pt may need some review with the other tools.   IADLs: Shopping: Spouse manages Light housekeeping: Spouse manages Meal Prep: Spouse manages Community mobility: Uses RW and requires frequent sitting breaks d/t back pain Medication management: modified Engineer, materials: Extra time for writing checks, with occasional assist from spouse on days when tremors are worse Handwriting: 90% legible, though pt reports this is an exceptional day today, and pt reports he's recently had to rip of checks and have spouse take  over d/t poor legibility.  MOBILITY STATUS: Hx of falls; spouse reports pt to have had 4 falls in the last 6 mo  POSTURE COMMENTS:  rounded shoulders, increased thoracic kyphosis, and hx of scoliosis , L shoulder rests significantly lower than R Sitting balance: Supports self with one extremity  ACTIVITY TOLERANCE: Activity tolerance: limited by low back pain   FUNCTIONAL OUTCOME MEASURES: FOTO: TBD  (PT managing FOTO)  UPPER  EXTREMITY ROM:  BUEs WFL  UPPER EXTREMITY MMT:     MMT Right eval Left eval  Shoulder flexion 5 5  Shoulder abduction 5 5  Shoulder adduction    Shoulder extension    Shoulder internal rotation 4 4  Shoulder external rotation 4 4  Middle trapezius    Lower trapezius    Elbow flexion    Elbow extension    Wrist flexion    Wrist extension    Wrist ulnar deviation    Wrist radial deviation    Wrist pronation    Wrist supination    (Blank rows = not tested)  HAND FUNCTION: Eval: Grip strength: Right: 30 lbs; Left: 45 lbs, Lateral pinch: Right: 12 lbs, Left: 5 lbs, and 3 point pinch: Right: 10 lbs, Left: 6 lbs 01/14/23: Grip strength: Right: 38 lbs; Left: 39 lbs, Lateral pinch: Right: 11 lbs, Left: 5 lbs, and 3 point pinch: Right: 9 lbs, Left: 5 lbs  COORDINATION: Eval: 9 Hole Peg test: Right: 33 sec; Left: 41 sec 01/14/23: 9 Hole Peg test: Right: 28 sec; Left: 29 sec  SENSATION: Not tested  EDEMA: No visible edema  MUSCLE TONE: RUE: Within functional limits and LUE: Within functional limits  COGNITION: Overall cognitive status: Within functional limits for tasks assessed; spouse reports some impulsivity/poor safety awareness contributing to falls at home  VISION: No recent changes; wears glasses for reading Visual history: macular degeneration, hx of cataract removal both eyes  PERCEPTION: Not tested  PRAXIS: Impaired: Motor planning  TODAY'S TREATMENT:                                                                                                                              DATE: 02/23/23 Moist heat applied to low back with support of lumbar pillow; a second pillow wedged between R side and arm rest of chair for midline position and back pain management throughout session.  Therapeutic Activity: Facilitated L/R FMC/dexterity skills, working to pick up and place Jamar pegs into pegboard using narrow tweezers.  Pt completed 2 rows with each hand, alternating hands  between each row.  Pt practiced with an alternative 5 cm length peg, working to move these pegs from horizontal to vertical position within fingertips, then rotating the peg 180 degrees within fingertips without dropping; performed 5-10 reps in each hand.  Intermittent tactile cues given at the forearm to isolate forearm from finger manipulation.  PATIENT EDUCATION: Education details: Reviewed FMC/dexterity activities for HEP d/t upcoming d/c next week Person educated:  Patient Education method: Explanation Education comprehension: verbalized understanding  HOME EXERCISE PROGRAM: Pt instructed to use his theraputty or his hand strengthener (spring based), [facility] exercises (handout issued), red theraband for bilat shoulder strengthening (ER/IR)  GOALS: Goals reviewed with patient? Yes  SHORT TERM GOALS: Target date: 01/21/23  Pt will perform HEP with supv for increasing strength and coordination in B hands.  Baseline: Eval: Will review previous HEP and add to as appropriate; 01/14/23: Pt using theraputty; added theraband today to target ER/IR strengthening at bilat shoulders Goal status: in progress   2.  Pt will implement fall prevention strategies with min vc from spouse at least 50% of the time.  Baseline: Spouse reports pt with 4 falls over the last 6 months, noting decreased safety awareness/impulsivity; 01/14/23: spouse reports inconsistent use of walker at times and pt walks in socks without grips.  Goal status: in progress  LONG TERM GOALS: Target date: 03/04/2023  Pt will increase FOTO score by (TBD) to indicate improvement in perceived functional performance with daily tasks. Baseline: Eval: FOTO to be given next session; 01/14/23: OT FOTO same as PT (PT managing) Goal status: in progress  2.  Pt will utilize AE/activity modification to participate in face time calls with family [place] with set up/supv. Baseline: Eval: Pt reports he is struggling to accurately press buttons on his  cell phone, often having spouse assist to manage a call (Initial recommendation at eval to transition to tablet for Face time calls to work with larger screen surface; spouse receptive); 03/15/22: No consistent changes implemented yet Goal status: in progress  3.  Pt will use adapted writing aids/utilize positioning strategies/activity modifications to enable pt to take simple notes when reading scripture and to write checks legibly. Baseline: Eval: Pt reports he has not been able to take notes in awhile d/t poor legibility and this task requiring increased effort and fatigue; 01/14/23: Pt tries to write checks as needed in the evening (more energy this time of day); variety of adapted pens have been tried; legibility inconsistent Goal status: in progress  4.  Pt will increase bilat grip strength by 5 or more lbs to improve ability to open containers.  Baseline: Eval: R grip: 30 lbs, L grip: 45 lbs; 01/14/23: R 38 lbs, L 39 lbs Goal status: in progress  5.  Pt will improve [facility] skills to increase efficiency with manipulation of eating and grooming utensils.   Baseline: Eval: R 33 sec, L 41 sec; 01/14/23: R 28 sec; L 29 sec Goal status: in progress  ASSESSMENT: CLINICAL IMPRESSION: Continued focus on FMC/dexterity skills in each hand this date, increasing challenge with dual tasking with conversation and providing intermittent tactile cues at the forearms to isolate forearms from finger manipulation skills.  Increased time and typically slightly higher effort required from the L hand as compared to the R.  Reviewed FMC/dexterity activities for HEP d/t upcoming d/c next week.  Pt verbalized understanding.  Pt continues to benefit from skilled OT to review/progress HEP, review/advise on activity modifications/AE, provide exercises and activities to promote hand strength and coordination skills, and reinforce fall prevention strategies to maximize safety, efficiency, and independence with daily tasks.    PERFORMANCE DEFICITS: in functional skills including ADLs, IADLs, coordination, dexterity, strength, pain, Fine motor control, Gross motor control, mobility, balance, body mechanics, endurance, decreased knowledge of use of DME, and UE functional use, cognitive skills including memory and safety awareness, and psychosocial skills including coping strategies, environmental adaptation, habits, and routines and behaviors.  IMPAIRMENTS: are limiting patient from ADLs, IADLs, leisure, and social participation.   CO-MORBIDITIES: has co-morbidities such as chronic pain syndrome, spinal stenosis, DDD, dizziness   that affects occupational performance. Patient will benefit from skilled OT to address above impairments and improve overall function.  MODIFICATION OR ASSISTANCE TO COMPLETE EVALUATION: No modification of tasks or assist necessary to complete an evaluation.  OT OCCUPATIONAL PROFILE AND HISTORY: Problem focused assessment: Including review of records relating to presenting problem.  CLINICAL DECISION MAKING: Moderate - several treatment options, min-mod task modification necessary  REHAB POTENTIAL: Good  EVALUATION COMPLEXITY: Low    PLAN:  OT FREQUENCY: 2x/week  OT DURATION: 12 weeks  PLANNED INTERVENTIONS: 97535 self care/ADL training, 06301 therapeutic exercise, 97530 therapeutic activity, 97112 neuromuscular re-education, 97010 moist heat, 97010 cryotherapy, passive range of motion, balance training, functional mobility training, visual/perceptual remediation/compensation, psychosocial skills training, energy conservation, coping strategies training, patient/family education, and DME and/or AE instructions  RECOMMENDED OTHER SERVICES: None at this time  CONSULTED AND AGREED WITH PLAN OF CARE: family member/caregiver  PLAN FOR NEXT SESSION: see above  Danelle Earthly, MS, OTR/L 02/23/2023, 12:23 PM

## 2023-03-02 ENCOUNTER — Ambulatory Visit: Payer: Medicare HMO

## 2023-03-02 DIAGNOSIS — R2681 Unsteadiness on feet: Secondary | ICD-10-CM | POA: Diagnosis not present

## 2023-03-02 DIAGNOSIS — R42 Dizziness and giddiness: Secondary | ICD-10-CM | POA: Diagnosis not present

## 2023-03-02 DIAGNOSIS — M5459 Other low back pain: Secondary | ICD-10-CM

## 2023-03-02 DIAGNOSIS — R262 Difficulty in walking, not elsewhere classified: Secondary | ICD-10-CM

## 2023-03-02 DIAGNOSIS — R278 Other lack of coordination: Secondary | ICD-10-CM

## 2023-03-02 DIAGNOSIS — G20C Parkinsonism, unspecified: Secondary | ICD-10-CM | POA: Diagnosis not present

## 2023-03-02 DIAGNOSIS — M6281 Muscle weakness (generalized): Secondary | ICD-10-CM | POA: Diagnosis not present

## 2023-03-02 NOTE — Therapy (Signed)
 OUTPATIENT PHYSICAL THERAPY NEURO TREATMENT/RECERT/DISCHARGE  Patient Name: Joseph Arroyo MRN: [id number] DOB:1941/02/09, 82 y.o., male Today's Date: 03/02/2023  END OF SESSION:  PT End of Session - 03/02/23 1406     Visit Number 31    Number of Visits 31    Date for PT Re-Evaluation 03/02/23    Authorization Type [employer]    Progress Note Due on Visit 10    PT Start Time 1406    PT Stop Time 1445    PT Time Calculation (min) 39 min    Equipment Utilized During Treatment Gait belt    Activity Tolerance Patient tolerated treatment well;No increased pain    Behavior During Therapy [facility] for tasks assessed/performed                          Past Medical History:  Diagnosis Date   Allergic rhinitis due to pollen 11/21/2007   Brachial neuritis or radiculitis NOS    Cervicalgia    Concussion    age 79 - s/p accident   Costal chondritis    Depression    Essential and other specified forms of tremor    GERD (gastroesophageal reflux disease)    in past   Lumbago    Occlusion and stenosis of carotid artery without mention of cerebral infarction    Seizures [facility])    age 44 - after concussion   Past Surgical History:  Procedure Laterality Date   CATARACT EXTRACTION Right 07/18/14   CATARACT EXTRACTION W/PHACO Left 08/01/2014   Procedure: CATARACT EXTRACTION PHACO AND INTRAOCULAR LENS PLACEMENT (IOC);  Surgeon: Dene Etienne, MD;  Location: [facility];  Service: Ophthalmology;  Laterality: Left;  IVA TOPICAL   COLONOSCOPY  2008   OTHER SURGICAL HISTORY  2002   ear surgery   STAPEDES SURGERY Right 2002   Patient Active Problem List   Diagnosis Date Noted   Dizziness 10/02/2022   Lumbar facet joint pain 06/25/2022   Osteoarthritis of facet joint of lumbar spine 06/25/2022   Lumbosacral facet joint hypertrophy 06/25/2022   Parkinson's disease with dyskinesia (HCC) 01/27/2022   Dysphagia, pharyngeal phase 09/17/2021   Cervicalgia  08/05/2021   Spondylosis without myelopathy or radiculopathy, lumbosacral region 06/24/2021   Lumbar central spinal stenosis, w/o neurogenic claudication (L4-5) 06/03/2021   Lumbar lateral recess stenosis (Bilateral: L2-3, L4-5) (Right: L3-4) 06/03/2021   Lumbosacral foraminal stenosis (Bilateral: L2-3, L3-4) (Left: L5-S1) 06/03/2021   Lumbar nerve root impingement (Right: L3 at L2-3 & L3-4) 06/03/2021   Ligamentum flavum hypertrophy (L3-4, L4-5) 06/03/2021   Abnormal MRI, lumbar spine (04/23/2021) 04/24/2021   Long term prescription benzodiazepine use (alprazolam ) (Xanax ) 03/24/2021   Grade 1 Retrolisthesis of L2/L3 (5 mm) and L3/L4 (3 mm) 03/24/2021   Levoscoliosis of lumbar spine (L3-4 apex) 03/24/2021   DDD (degenerative disc disease), lumbosacral 03/24/2021   Lumbosacral facet arthropathy (Left: L3-4, L4-5, and L5-S1) 03/24/2021   Tricompartment osteoarthritis of knee (Left) 03/24/2021   Baker cyst (Left) 03/24/2021   Chronic low back pain (1ry area of Pain) (Bilateral) (R>L) w/o sciatica 03/24/2021   Lumbar facet syndrome (Bilateral) 03/24/2021   Chronic lower extremity pain (2ry area of Pain) (Bilateral) (L>R) 03/24/2021   Lumbosacral radiculitis/sensory radiculopathy at L2 (Bilateral) 03/24/2021   Lumbosacral radiculitis/sensory radiculopathy at L3 (Bilateral) 03/24/2021   Chronic pain syndrome 03/23/2021   Pharmacologic therapy 03/23/2021   Disorder of skeletal system 03/23/2021   Problems influencing health status 03/23/2021   PAD (peripheral artery disease) (  HCC) 10/01/2020   GAD (generalized anxiety disorder) 01/13/2018   MDD (major depressive disorder) 01/13/2018   Umbilical hernia 09/04/2017   Chronic knee pain (Left) 08/11/2017   Derangement of medial meniscus, posterior horn (Left) 08/11/2017   Vitamin D  insufficiency 03/05/2016   BPH without obstruction/lower urinary tract symptoms 07/08/2015   Chronic fatigue 10/23/2014   Major depression, recurrent, full remission  (HCC) 07/26/2013   Unsteady gait 07/26/2013   Orthostatic hypotension 07/26/2013   Depression 07/26/2013   Anxiety 06/23/2013   Chronic anxiety 06/23/2013   Tremor 05/18/2013   Bilateral carotid artery stenosis 08/30/2012    PCP: Edman Marsa PARAS, DO  REFERRING PROVIDER:   Mecum, Rocky BRAVO, PA-C    REFERRING DIAG: R42 (ICD-10-CM) - Dizziness   THERAPY DIAG:  Difficulty in walking, not elsewhere classified  Unsteadiness on feet  Muscle weakness (generalized)  Other lack of coordination  Other low back pain  ONSET DATE: August 1st 2024  Rationale for Evaluation and Treatment: Rehabilitation  SUBJECTIVE:   SUBJECTIVE STATEMENT Pt doing OK, today is last PT visit due to insurance change.  Pt accompanied by: significant other   PERTINENT HISTORY:  The pt is a pleasant 82 yo male referred to PT for dizziness. Pt known to clinic, previously seen for falls in the setting of PD.  Pt spouse present for eval and reports pt was seen by neurologist recently at [facility] following onset of dizziness about two-three weeks ago. He reports they think he has BPPV.  PT spouse reports he had blood work done at this visit which didn't show anything and was normal. Pt unsure if his symptoms come on when he is still at rest. He describes his symptoms as spinning. This can be triggered when he lies on his back and also when he sits up from supine. Spinning lasts for minutes. He reports no nausea.  Pt sometimes sees double when he looks to the side and this has been going on for weeks. Denies any one sided weakness or other changes. He has fallen 4 times in past 3 months. Pt spouse reports pt had a fall about a week prior to dizziness onset. Pt reports no neck pain, has hx of LBP. His spouse reports decline in his mobility since this started. Pt uses RW at baseline for ambulation. Pt now being seen primarily for PD impairments. Pt PMH significant for Parkinson's disease, concussion, cervicalgia,  depression, seizures, chronic L knee pain, chronic anxiety, DDD, PAD, chronic pain syndrome, chronic low back pain, please refer to chart for full, extensive, PMH. PAIN:  Are you having pain? No  6/10 back pain at this time .   PRECAUTIONS: Fall   WEIGHT BEARING RESTRICTIONS: No  FALLS: Has patient fallen in last 6 months? Yes. Number of falls 4  LIVING ENVIRONMENT: via chart and confirmed by pt in session Lives with: lives with their spouse Lives in: House/apartment Stairs: 4 Has following equipment at home: Walker - 2 wheeled  PLOF:  needs assistance, pt with PD that affected his mobility/ADLs  PATIENT GOALS: get rid of dizziness  OBJECTIVE:   TREATMENT:  DATE: 03/02/23  Pt seated in armchair with pillow doubled on his R and 1 pillow behind in chair with heat pad applied to low back region. (reports heat feels good, no adverse reaction to heat)  TA: Partial goal reassessment completed for final visit. Please refer to goal section/assessment below for details.  PT reviewed d/c recommendations and updated HEP below in session. Pt performed sets and reps as prescribed below to confirm correct, safe technique and that pt did not experience increased pain with activity.  Access Code: [email or web address] URL: [email or web address] Date: 03/02/2023 Prepared by: Darryle Patten  Exercises - Seated March  - 1 x daily - 5-6 x weekly - 4 sets - 10-20 rep  - Seated Long Arc Quad  - 1 x daily - 5-6 x weekly - 2 sets - 15-20 reps - Seated Heel Toe Raises  - 1 x daily - 5-6 x weekly - 2 sets - 20 reps - Walking  - 1 x daily - 7 x weekly - 3 sets - 1 reps - 60 sec hold * written instruction to complete with RW Access Code: [email or web address] URL: [email or web address] Date: 03/02/2023 Prepared by: Darryle Patten  Exercises - Sit to Stand with Counter Support  - 1 x daily - 5-6 x weekly -  2-3 sets - 5-10 reps    PATIENT EDUCATION: Education details: exercise technique, update to hep, d/c recommendations Person education: patient, spouse  HOME EXERCISE PROGRAM: Continue as previously given: Access Code: [email or web address] URL: [email or web address] Date: 01/12/2023 Prepared by: Darryle Patten  Exercises - Seated Shoulder Flexion Full Range Single Arm  - 1 x daily - 7 x weekly - 2 sets - 10 reps  Access Code: [email or web address] URL: [email or web address] Date: 12/08/2022 Prepared by: Darryle Patten  Exercises - Seated March  - 1 x daily - 5-6 x weekly - 4 sets - 10 reps - Seated Long Arc Quad  - 1 x daily - 5-6 x weekly - 2 sets - 15 reps - Seated Heel Toe Raises  - 1 x daily - 5-6 x weekly - 2 sets - 20 reps   Access Code: [email or web address] URL: [email or web address] Date: 12/24/2022 Prepared by: Sidra Simpers  Exercises - Standing Lumbar Extension at Wall - Forearms  - 1 x daily - 7 x weekly - 3 sets - 10 reps - Prone Press Up On Elbows  - 1 x daily - 7 x weekly - 3 sets - 10 reps - Seated Lumbar Extension  - 1 x daily - 7 x weekly - 3 sets - 10 reps  GOALS:   Goals reviewed with patient? Yes   SHORT TERM GOALS: Target date: 04/13/2023   Patient will be independent in home exercise program to improve dizziness, balance and mobility for better safety and functional independence with ADLs. Baseline: to be initiated future visit/as needed 11/12:  Goal status: IN PROGRESS    LONG TERM GOALS: Target date: 05/25/2023    Patient will increase FOTO score to equal to or greater than 53   to demonstrate statistically significant improvement in mobility and quality of life.  Baseline: 44; 01/12/23: 52; 12/24: 38; 12/31: 56 reviewed with pt that questionnaire was regarding referral for dizziness not PD (incorrectly filled out last visit) Goal status: MET   2.  Patient will reduce dizziness handicap inventory score to <50, for less dizziness with ADLs  and increased safety with home and work tasks.  Baseline: deferred, will complete as pt able: 10/10: 48, still has difficulty with bed mobility  Goal status: MET  3.  The pt will report at least a 60% decrease in positional or movement triggered dizziness symptoms in order to indicate decreased fall risk and increased balance/safety with mobility. Baseline: pt presents with decreased mobility due to dizziness, dizziness/vertigo triggered by particular positional; changes; 10/10: 60-70%  Goal status: MET  4.  Patient will reduce timed up and go to <11 seconds to reduce fall risk and demonstrate improved transfer/gait ability. Baseline: 9/5: 18 sec; 10/10: 16 seconds seconds; 11/12: 17.5 sec with RW; 12/24: 13 seconds 12/31: 13 seconds Goal status: IN PROGRESS   5.Patient (> 29 years old) will complete five times sit to stand test in < 15 seconds indicating an increased LE strength and improved balance. Baseline: 12/01/22: 26 sec; 11/12: 22.5 sec hands-free; 12/24: 13 seconds hands-free Goal status: MET  6. Patient will increase 10 meter walk test to >1.79m/s as to improve gait speed for better community ambulation and to reduce fall risk. Baseline: 12/01/22: 0.6 m/s with RW; 11/12:  0.95 m/s with RW ; 02/23/23: 1.05 m/s with RW  Goal status: PARTIALLY MET   ASSESSMENT:  CLINICAL IMPRESSION: FOTO reassessed and pt scored 56 today, meeting goal. TUG score remains the same at 13 seconds to complete activity, indicating increased fall risk. Pt reported to PT that today would be last PT appt due to insurance change. PT updated and reviewed HEP and provided education on importance of continuing HEP to maintain therapy gains.   OBJECTIVE IMPAIRMENTS: Abnormal gait, decreased activity tolerance, decreased balance, decreased coordination, decreased mobility, difficulty walking, decreased strength, dizziness, impaired vision/preception, improper body mechanics, and postural dysfunction.   ACTIVITY  LIMITATIONS: lifting, bending, squatting, stairs, transfers, bed mobility, toileting, dressing, hygiene/grooming, and locomotion level  PARTICIPATION LIMITATIONS: meal prep, cleaning, laundry, shopping, community activity, and yard work  PERSONAL FACTORS: Age, Fitness, and 3+ comorbidities: Pt PMH significant for Parkinson's disease, concussion, cervicalgia, depression, seizures, chronic L knee pain, chronic anxiety, DDD, PAD, chronic pain syndrome, chronic low back pain, please refer to chart for full, extensive, PMH.  are also affecting patient's functional outcome.   REHAB POTENTIAL: Good  CLINICAL DECISION MAKING: Evolving/moderate complexity  EVALUATION COMPLEXITY: Moderate   PLAN:  PT FREQUENCY: 1-2x/week   PT DURATION: 12 weeks  PLANNED INTERVENTIONS: Therapeutic exercises, Therapeutic activity, Neuromuscular re-education, Balance training, Gait training, Patient/Family education, Self Care, Joint mobilization, Stair training, Vestibular training, Canalith repositioning, Visual/preceptual remediation/compensation, DME instructions, Electrical stimulation, Wheelchair mobility training, Spinal mobilization, Cryotherapy, Moist heat, Splintting, Taping, Traction, Manual therapy, and Re-evaluation  PLAN FOR NEXT SESSION: attempt standing/supported PD interventions, balance  Darryle JONELLE Patten PT ,DPT Physical Therapist- [facility]  [facility]   03/02/23, 5:17 PM

## 2023-03-02 NOTE — Therapy (Signed)
 OUTPATIENT OCCUPATIONAL THERAPY NEURO DISCHARGE NOTE  Patient Name: Joseph Arroyo MRN: [id number] DOB:November 11, 1940, 82 y.o., male Today's Date: 03/02/2023  PCP: Dr. Marsa Officer REFERRING PROVIDER: PCP  END OF SESSION:  OT End of Session - 03/02/23 1309     Visit Number 20    Number of Visits 24    Date for OT Re-Evaluation 03/04/23    Progress Note Due on Visit 10    OT Start Time 1315    OT Stop Time 1400    OT Time Calculation (min) 45 min    Equipment Utilized During Treatment RW    Activity Tolerance Patient tolerated treatment well    Behavior During Therapy [facility] for tasks assessed/performed            Past Medical History:  Diagnosis Date   Allergic rhinitis due to pollen 11/21/2007   Brachial neuritis or radiculitis NOS    Cervicalgia    Concussion    age 51 - s/p accident   Costal chondritis    Depression    Essential and other specified forms of tremor    GERD (gastroesophageal reflux disease)    in past   Lumbago    Occlusion and stenosis of carotid artery without mention of cerebral infarction    Seizures [facility])    age 47 - after concussion   Past Surgical History:  Procedure Laterality Date   CATARACT EXTRACTION Right 07/18/14   CATARACT EXTRACTION W/PHACO Left 08/01/2014   Procedure: CATARACT EXTRACTION PHACO AND INTRAOCULAR LENS PLACEMENT (IOC);  Surgeon: Dene Etienne, MD;  Location: [facility];  Service: Ophthalmology;  Laterality: Left;  IVA TOPICAL   COLONOSCOPY  2008   OTHER SURGICAL HISTORY  2002   ear surgery   STAPEDES SURGERY Right 2002   Patient Active Problem List   Diagnosis Date Noted   Dizziness 10/02/2022   Lumbar facet joint pain 06/25/2022   Osteoarthritis of facet joint of lumbar spine 06/25/2022   Lumbosacral facet joint hypertrophy 06/25/2022   Parkinson's disease with dyskinesia (HCC) 01/27/2022   Dysphagia, pharyngeal phase 09/17/2021   Cervicalgia 08/05/2021   Spondylosis without myelopathy  or radiculopathy, lumbosacral region 06/24/2021   Lumbar central spinal stenosis, w/o neurogenic claudication (L4-5) 06/03/2021   Lumbar lateral recess stenosis (Bilateral: L2-3, L4-5) (Right: L3-4) 06/03/2021   Lumbosacral foraminal stenosis (Bilateral: L2-3, L3-4) (Left: L5-S1) 06/03/2021   Lumbar nerve root impingement (Right: L3 at L2-3 & L3-4) 06/03/2021   Ligamentum flavum hypertrophy (L3-4, L4-5) 06/03/2021   Abnormal MRI, lumbar spine (04/23/2021) 04/24/2021   Long term prescription benzodiazepine use (alprazolam ) (Xanax ) 03/24/2021   Grade 1 Retrolisthesis of L2/L3 (5 mm) and L3/L4 (3 mm) 03/24/2021   Levoscoliosis of lumbar spine (L3-4 apex) 03/24/2021   DDD (degenerative disc disease), lumbosacral 03/24/2021   Lumbosacral facet arthropathy (Left: L3-4, L4-5, and L5-S1) 03/24/2021   Tricompartment osteoarthritis of knee (Left) 03/24/2021   Baker cyst (Left) 03/24/2021   Chronic low back pain (1ry area of Pain) (Bilateral) (R>L) w/o sciatica 03/24/2021   Lumbar facet syndrome (Bilateral) 03/24/2021   Chronic lower extremity pain (2ry area of Pain) (Bilateral) (L>R) 03/24/2021   Lumbosacral radiculitis/sensory radiculopathy at L2 (Bilateral) 03/24/2021   Lumbosacral radiculitis/sensory radiculopathy at L3 (Bilateral) 03/24/2021   Chronic pain syndrome 03/23/2021   Pharmacologic therapy 03/23/2021   Disorder of skeletal system 03/23/2021   Problems influencing health status 03/23/2021   PAD (peripheral artery disease) (HCC) 10/01/2020   GAD (generalized anxiety disorder) 01/13/2018   MDD (major depressive disorder)  01/13/2018   Umbilical hernia 09/04/2017   Chronic knee pain (Left) 08/11/2017   Derangement of medial meniscus, posterior horn (Left) 08/11/2017   Vitamin D  insufficiency 03/05/2016   BPH without obstruction/lower urinary tract symptoms 07/08/2015   Chronic fatigue 10/23/2014   Major depression, recurrent, full remission (HCC) 07/26/2013   Unsteady gait 07/26/2013    Orthostatic hypotension 07/26/2013   Depression 07/26/2013   Anxiety 06/23/2013   Chronic anxiety 06/23/2013   Tremor 05/18/2013   Bilateral carotid artery stenosis 08/30/2012   ONSET DATE: 2015  REFERRING DIAG: Parkinson's Disease  THERAPY DIAG:  Muscle weakness (generalized)  Other lack of coordination  Atypical parkinsonism (HCC)  Rationale for Evaluation and Treatment: Rehabilitation  SUBJECTIVE:  SUBJECTIVE STATEMENT: Pt reports having had a nice quiet holiday. Pt accompanied by: family member, son-in-law, Maude  PERTINENT HISTORY: Hx of Parkinson's disease (HCC); Chronic knee pain (Left); Derangement of medial meniscus, posterior horn (Left); PAD (peripheral artery disease) (HCC); Chronic pain syndrome; DDD (degenerative disc disease) lumbosacral; osteoarthritis of knee (Left); Chronic low back pain, Lumbar facet syndrome (Bilateral); Chronic lower extremity pain.   PRECAUTIONS: Fall  WEIGHT BEARING RESTRICTIONS: No  PAIN: 03/02/23: 3/10 pain low back Are you having pain? Yes: NPRS scale: 8-9/10 Pain location: low back  Pain description: aching, sometimes sharp  Aggravating factors: activity  Relieving factors: lying down, heat, ice pack  FALLS: Has patient fallen in last 6 months? Yes. Number of falls 4  LIVING ENVIRONMENT: Lives with: lives with their spouse Lives in: 1 level home Stairs: 4 steps, a landing, then 3 more steps with bilat rails.  Has following equipment at home: Vannie - 2 wheeled, Shower bench, bed side commode, and Grab bars   PLOF: Needs assistance with ADLs.  Pt performs basic self care with set up assist, extra time to manage clothing fasteners, occasional min A on days with worsening Parkinson's symptoms or back pain.    PATIENT GOALS: Pt wants to regain some strength and coordination skills in bilat hands.   OBJECTIVE:  Note: Objective measures were completed at Evaluation unless otherwise noted.  HAND DOMINANCE:  Right  ADLs: Overall ADLs: Extensive time required; occasional min A from spouse Transfers/ambulation related to ADLs: supv-modified indep with RW Eating: difficulty cutting food, struggles with loading food on a fork (uses built up foam handles at times); spouse cuts steak Grooming: electric toothbrush and standard toothbrush, but mostly uses electric; increased difficulty with brushing teeth and shaving (has electric trimmer and regular razor) UB Dressing: magnetic buttons on some shirts; more difficult to button shirts, occasional help with donning a shirt (min A)  LB Dressing: difficulty tying shoe laces, increased time; uses long shoe horn, has a hip kit; occasional assist to don socks  Toileting: modified indep/extra time  Bathing: able to take a shower with supv with tub bench Tub Shower transfers: transfer tub bench Equipment:  Pt does have hip kit and he regularly uses the long shoe horn.  Spouse reports pt may need some review with the other tools.   IADLs: Shopping: Spouse manages Light housekeeping: Spouse manages Meal Prep: Spouse manages Community mobility: Uses RW and requires frequent sitting breaks d/t back pain Medication management: modified Engineer, Materials: Extra time for writing checks, with occasional assist from spouse on days when tremors are worse Handwriting: 90% legible, though pt reports this is an exceptional day today, and pt reports he's recently had to rip of checks and have spouse take over d/t poor legibility.  MOBILITY STATUS:  Hx of falls; spouse reports pt to have had 4 falls in the last 6 mo  POSTURE COMMENTS:  rounded shoulders, increased thoracic kyphosis, and hx of scoliosis , L shoulder rests significantly lower than R Sitting balance: Supports self with one extremity  ACTIVITY TOLERANCE: Activity tolerance: limited by low back pain   FUNCTIONAL OUTCOME MEASURES: FOTO: TBD  (PT managing FOTO)  UPPER EXTREMITY ROM:  BUEs  WFL  UPPER EXTREMITY MMT:     MMT Right eval Right 03/02/23 Left eval Left: 03/02/23  Shoulder flexion 5 5 5 5   Shoulder abduction 5 5 5 5   Shoulder adduction      Shoulder extension      Shoulder internal rotation 4 4+ 4 5  Shoulder external rotation 4 4 4 5   Middle trapezius      Lower trapezius      Elbow flexion      Elbow extension      Wrist flexion      Wrist extension      Wrist ulnar deviation      Wrist radial deviation      Wrist pronation      Wrist supination      (Blank rows = not tested)  HAND FUNCTION: Eval: Grip strength: Right: 30 lbs; Left: 45 lbs, Lateral pinch: Right: 12 lbs, Left: 5 lbs, and 3 point pinch: Right: 10 lbs, Left: 6 lbs 01/14/23: Grip strength: Right: 38 lbs; Left: 39 lbs, Lateral pinch: Right: 11 lbs, Left: 5 lbs, and 3 point pinch: Right: 9 lbs, Left: 5 lbs 03/02/23: Grip strength: Right: 45 lbs; Left: 42; Lateral pinch: Right: 12 lbs, Left: 6 lbs, and 3 point pinch: Right: 12 lbs, Left: 8 lbs  COORDINATION: Eval: 9 Hole Peg test: Right: 33 sec; Left: 41 sec 01/14/23: 9 Hole Peg test: Right: 28 sec; Left: 29 sec 03/02/23: 9 Hole Peg test: Right: 26 sec; Left: 29 sec  SENSATION: Not tested  EDEMA: No visible edema  MUSCLE TONE: RUE: Within functional limits and LUE: Within functional limits  COGNITION: Overall cognitive status: Within functional limits for tasks assessed; spouse reports some impulsivity/poor safety awareness contributing to falls at home  VISION: No recent changes; wears glasses for reading Visual history: macular degeneration, hx of cataract removal both eyes  PERCEPTION: Not tested  PRAXIS: Impaired: Motor planning  TODAY'S TREATMENT:                                                                                                                              DATE: 03/02/23 Moist heat applied to low back with support of lumbar pillow; a second pillow wedged between R side and arm rest of chair for midline  position and back pain management throughout session.  Therapeutic Activity: Objective measures taken and goals updated and reviewed for d/c.  HEP reviewed.  PATIENT EDUCATION: Education details: HEP review Person educated: Patient, spouse Education method: Explanation, demo, visual handout Education comprehension: verbalized  understanding  HOME EXERCISE PROGRAM: Pt instructed to use his theraputty or his hand strengthener (spring based), [facility] exercises (handout issued), red theraband for bilat shoulder strengthening (ER/IR); theraputty  GOALS: Goals reviewed with patient? Yes  SHORT TERM GOALS: Target date: 01/21/23  Pt will perform HEP with supv for increasing strength and coordination in B hands.  Baseline: Eval: Will review previous HEP and add to as appropriate; 01/14/23: Pt using theraputty; added theraband today to target ER/IR strengthening at bilat shoulders; 03/02/23: indep to perform once activities are set up Goal status: achieved   2.  Pt will implement fall prevention strategies with min vc from spouse at least 50% of the time.  Baseline: Spouse reports pt with 4 falls over the last 6 months, noting decreased safety awareness/impulsivity; 01/14/23: spouse reports inconsistent use of walker at times and pt walks in socks without grips; 03/02/23: Pt reports being more conscientious of using RW and has started to make sure that he wears footwear with tread as opposed to slippery socks Goal status: achieved  LONG TERM GOALS: Target date: 03/04/2023  Pt will increase FOTO score by (TBD) to indicate improvement in perceived functional performance with daily tasks. Baseline: Eval: FOTO to be given next session; 01/14/23: OT FOTO same as PT (PT managing) Goal status: d/c (see PT note for details)  2.  Pt will utilize AE/activity modification to participate in face time calls with family [place] with set up/supv. Baseline: Eval: Pt reports he is struggling to accurately press  buttons on his cell phone, often having spouse assist to manage a call (Initial recommendation at eval to transition to tablet for Face time calls to work with larger screen surface; spouse receptive); 01/14/23: No consistent changes implemented  Goal status: d/c  3.  Pt will use adapted writing aids/utilize positioning strategies/activity modifications to enable pt to take simple notes when reading scripture and to write checks legibly. Baseline: Eval: Pt reports he has not been able to take notes in awhile d/t poor legibility and this task requiring increased effort and fatigue; 01/14/23: Pt tries to write checks as needed in the evening (more energy this time of day); variety of adapted pens have been tried; legibility inconsistent; 03/02/23: no change from 01/14/23; Pt has good legibility when he takes his time Goal status: achieved  4.  Pt will increase bilat grip strength by 5 or more lbs to improve ability to open containers.  Baseline: Eval: R grip: 30 lbs, L grip: 45 lbs; 01/14/23: R 38 lbs, L 39 lbs; 03/02/23: R grip 45 lbs, L grip 42 lbs Goal status: partially met/d/c  5.  Pt will improve [facility] skills to increase efficiency with manipulation of eating and grooming utensils.   Baseline: Eval: 9 Hole Peg test: R 33 sec, L 41 sec; 01/14/23: R 28 sec; L 29 sec; 03/02/23: R: 26 sec; L: 29 sec Goal status: achieved  ASSESSMENT: CLINICAL IMPRESSION: Pt seen for OT d/c this date.  Pt has completed 20 OT sessions targeting bilat hand strength, FMC skills, HEP instruction, fall prevention, AE training, and activity modification in order to maximize indep with daily tasks.  At discharge, pt is indep with HEP and has shown good improvements in R hand grip strength.  L grip strength not improved but also with no significant decline, and R grip was targeted more than L.  [facility] skills both show improvement as noted by 9 hole peg test scores bilaterally; see above.  Pt appropriate for OT d/c this date  as pt  understands all education provided for activity modification, fall prevention, and AE in order to maximize indep with ADLs, and pt has a variety of exercises and activities to continue to target his bilat hand strength and [facility] skills in the home.  No additional skilled OT indicated at this time.      PERFORMANCE DEFICITS: in functional skills including ADLs, IADLs, coordination, dexterity, strength, pain, Fine motor control, Gross motor control, mobility, balance, body mechanics, endurance, decreased knowledge of use of DME, and UE functional use, cognitive skills including memory and safety awareness, and psychosocial skills including coping strategies, environmental adaptation, habits, and routines and behaviors.   IMPAIRMENTS: are limiting patient from ADLs, IADLs, leisure, and social participation.   CO-MORBIDITIES: has co-morbidities such as chronic pain syndrome, spinal stenosis, DDD, dizziness   that affects occupational performance. Patient will benefit from skilled OT to address above impairments and improve overall function.  MODIFICATION OR ASSISTANCE TO COMPLETE EVALUATION: No modification of tasks or assist necessary to complete an evaluation.  OT OCCUPATIONAL PROFILE AND HISTORY: Problem focused assessment: Including review of records relating to presenting problem.  CLINICAL DECISION MAKING: Moderate - several treatment options, min-mod task modification necessary  REHAB POTENTIAL: Good  EVALUATION COMPLEXITY: Low    PLAN:  OT FREQUENCY: 2x/week  OT DURATION: 12 weeks  PLANNED INTERVENTIONS: 97535 self care/ADL training, 02889 therapeutic exercise, 97530 therapeutic activity, 97112 neuromuscular re-education, 97010 moist heat, 97010 cryotherapy, passive range of motion, balance training, functional mobility training, visual/perceptual remediation/compensation, psychosocial skills training, energy conservation, coping strategies training, patient/family education, and DME  and/or AE instructions  RECOMMENDED OTHER SERVICES: None at this time  CONSULTED AND AGREED WITH PLAN OF CARE: family member/caregiver  PLAN FOR NEXT SESSION: N/A; d/c this date  Inocente Blazing, MS, OTR/L 03/02/2023, 1:11 PM

## 2023-03-04 ENCOUNTER — Ambulatory Visit: Payer: Self-pay

## 2023-03-04 NOTE — Telephone Encounter (Signed)
 error

## 2023-03-08 ENCOUNTER — Telehealth: Payer: Medicare HMO | Admitting: Psychiatry

## 2023-03-09 ENCOUNTER — Ambulatory Visit: Payer: Self-pay

## 2023-03-10 ENCOUNTER — Encounter: Payer: Self-pay | Admitting: Psychiatry

## 2023-03-10 ENCOUNTER — Ambulatory Visit (INDEPENDENT_AMBULATORY_CARE_PROVIDER_SITE_OTHER): Payer: No Typology Code available for payment source | Admitting: Psychiatry

## 2023-03-10 DIAGNOSIS — F3341 Major depressive disorder, recurrent, in partial remission: Secondary | ICD-10-CM

## 2023-03-10 DIAGNOSIS — G894 Chronic pain syndrome: Secondary | ICD-10-CM | POA: Diagnosis not present

## 2023-03-10 DIAGNOSIS — F411 Generalized anxiety disorder: Secondary | ICD-10-CM | POA: Diagnosis not present

## 2023-03-10 MED ORDER — PAROXETINE HCL 40 MG PO TABS: ORAL_TABLET | ORAL | 1 refills | Status: DC

## 2023-03-10 MED ORDER — PAROXETINE HCL 20 MG PO TABS
ORAL_TABLET | ORAL | 1 refills | Status: DC
Start: 2023-03-10 — End: 2023-07-28

## 2023-03-10 MED ORDER — ALPRAZOLAM 0.25 MG PO TABS: 0.2500 mg | ORAL_TABLET | Freq: Three times a day (TID) | ORAL | 0 refills | Status: DC | PRN

## 2023-03-10 MED ORDER — GABAPENTIN 100 MG PO CAPS
ORAL_CAPSULE | ORAL | 1 refills | Status: DC
Start: 2023-03-10 — End: 2023-07-28

## 2023-03-10 NOTE — Progress Notes (Signed)
 Joseph Arroyo [id number] [birth year] 83 y.o.  Virtual Visit via Telephone Note  I connected with pt by telephone and verified that I am speaking with the correct person using two identifiers.   I discussed the limitations, risks, security and privacy concerns of performing an evaluation and management service by telephone and the availability of in person appointments. I also discussed with the patient that there may be a patient responsible charge related to this service. The patient expressed understanding and agreed to proceed.  I discussed the assessment and treatment plan with the patient. The patient was provided an opportunity to ask questions and all were answered. The patient agreed with the plan and demonstrated an understanding of the instructions.   The patient was advised to call back or seek an in-person evaluation if the symptoms worsen or if the condition fails to improve as anticipated.  I provided 30 minutes of non-face-to-face time during this encounter. The patient was located at home and the provider was located office. Session 1000-1030  Subjective:   Patient ID:  Joseph Arroyo is a 83 y.o. (DOB 1940-09-12) male.  Chief Complaint:  Chief Complaint  Patient presents with   Follow-up   Depression   Anxiety   Stress    health   Medication Reaction    Depression        Associated symptoms include fatigue.  Past medical history includes anxiety.   Anxiety Symptoms include nervous/anxious behavior. Patient reports no dizziness or palpitations.     Joseph Arroyo ( lor-on) presents to the office today for follow-up of major depression and generalized anxiety disorder.    He was seen December, 2020 & June 2021.  No meds were changed.  Continued paroxetine  60 mg daily plus gabapentin  100 mg twice daily and Mirapex  0.125 mg 3 times daily.   08/2019 appt without med changes noted: Tolerates the meds well.  Wife thinks he's doing very well and he  agrees.   No complaints.  2 Joseph's.   Still exercising and has fatigue but can function and enjoys activity. Restarted men's Bible study.  He feels positive and hopeful and useful.  At some point would like to be free of medication.  Does not feel guilty about the meds.    03/19/2020 appt noted:  Seen with wife Joseph Arroyo August and took Ivermectin and both had it mild. As far mood concerned he's doing well.  Wife agrees.  Not markedly depressed or anxious.  Feels the suffering of others.  Arzella Hug in hospital in [place].  Friend with Covid also. No episodes. Tolerating meds.  No Xanax  used.  Attend [employer] in [place]. Usually sleep well but back pain can interfere with sleep. TX for PD and a bit slower per wife. Plan: Try increasing gabapentin  to 100 mg AM & 200 mg PM for back pain.   Continue paroxetine  60 mg daily and Deplin 15 mg daily  10/03/2020 appointment with the following noted: Seen with wife, Joseph Arroyo and on increased gabapentin  at PM to 200 mg for back pain and sleeps better.   Not depressed.  Anxiety is manageable.   Seeing chiropracter who he likes Dr. Clovia in [place]. No SE noted with meds.  Perspire heavily. No panic attacks.   Sleep variable and is usually OK. Still leads men's Bible Study. Uses [employer] Plan: No med change  03/25/2021 appointment with the following noted: with wife Joseph Arroyo but came on quickly. Shaking with anxiety and  been going on for 3-4 weeks. Rare use of Xanax . A lot of physical px with constant pain with back. Pending workup.  Fell 3 weeks ago. T pain clinic now.  Everything seemed to pile up. Insomnia with mind racing with worry.  He can't tolerate negativity.   Rare panic. Consistent with paroxetine  60 mg daily. Consistent with gabapentin  100 AM and 200 PM Plan: For persistent symptoms of anxiety we will start Arroyo label trial of clonidine .  Cautioned about side effects.  05/13/2021 appointment with the following  noted: wife Joseph on call Taking clonidine  0.1 mg tablet 1/2 tablets twice daily. Can't tell if there has been benefit from this.   Still taking Xanax  but usually about one daily. Couldn't get here DT pain.  Been to pain clinic but no meds yet.  Problems with insurance company giving approval for the tests.  At times pain is excruciating.   BP has been OK.   Being cautious about getting up and protecting from dizziness. Tremors will wax and wane and worse with stress.   Fighting over the idea God does not want him to be like this and feels there is something wrong with him.   No SI  that's not an option.   Plan: Continue paroxetine  60 mg daily DT PD hesitate to use atypicals for TR anxiety. Consider increasing gabapentin  for anxiety Arroyo label.  Yes increase to 200 mg BID Ok Xanax   Clonidine  0.1 mg tablets 1/2 tablet twice daily    07/03/2021 appointment with the following noted: seen with Joseph on call Using Xanax  prn 1-2 daily. Increased gabapentin  100 mg AM and noon and 200 mg PM; hard to tell if it helped any but it might have.  No SE noted. Feels fine today and thinks there's improvement.  Believe the Joseph Arroyo is bringing him through this. Epidural recently.  Some pain relief. Per wife has had some anxious moments.   Tremors vary.   Some PT, balance a little worse since Xmas.   No low BP. When sleep is fine but 2-3 hours at a time and nocturia. He thinks depression is a lot better, today I feel good.  Needs to get out socially.  He's a people person.  She thinks today is best day since Jan.   Plan: Consider increasing gabapentin  for anxiety Arroyo label.  Yes increase to 200 mg BID  09/09/2021 appointment with following noted: with wife Joseph Doing ok thank you. A lot better with depression and anxiety. Gabapentin  100 mg AM and noon and 200 HS. Gets sleepy in Am after breakfast with Sinemet .  Takes a nap. Sleep pretty well. Disc scamming but they have been careful. Wife notices anxiety  and tremors reenforce each other.  Only one Xanax  daily.  Wants to go out daily. Not attending main church but can go to house church and is getting out more. No SE except sleepiness. 3 back pain procedures with some benefit. Started speech therapy and OT and he likes to be active. Plan: no med changes Continur paroxetine  60 mg daily gabapentin  100 mg capsules 1 every morning and noon and 200 mg nightly,  01/12/22 appt noted:  (Joseph Arroyo) Continues alprazolam  0.25 mg 3 times daily as needed anxiety (taking 1 daily), clonidine  0.1 mg tablets one half twice daily, gabapentin  100 mg every morning and noon and 200 mg nightly, L-methylfolate, paroxetine  20 mg every morning and 40 mg nightly, pramipexole  0.125 mg nightly. Increased carbi-levo to 2 and 1/2 tab TID.  Vivid dreams.  Last night a disturbing one.  A couple of times in past remotely acted out the dream.   Feeling tired with the meds.  Pain meds also make him tired and PD too.  Continues to believe God will heal him completely.  Getting facet blocks at the pain doc.  Continues shots. Depression is minimal but more anxiety than depression.  Will sometimes Xanax  for tremor.  Not alsways used daily.  Finished speech therapy but still doing PT.  Is better when he goes out somewhere.  Family coming soon,  Sleep is otherwise OK.   Tendency to constipation or diarrhea.   Asks about natural meds to help his medical problems if possible.  He'Joseph like some changes .   Church built him a ramp for his house. Plan: No med changes   05/13/2022 appointment noted:  with Joseph, reviewed med list with her Not taking pramipexole  hs but is taking gabapentin . More difficulty speaking than he has been in the past.   Holding my own.  Struggling quite a bit.  More anxiety provoked by tremor or bad dreams.  Latter caused bad dreams.  Still some.  Not acting out dreams like before.  Bad dreams about 2 times per week.   Usually sleep 2 hours at time with nocturia.   PD  sx are worse per wife.  Harder to walk and do things.   No VH with meds for PD. Anxiety remains pretty high and needs the Xanax .   ? Tiredness with meds.   Plan: Trial wean  and if anxiety is worse then resume Clonidine  0.1 mg tablets 1/2 tablet twice daily    06/30/22 appt noted: with Joseph Radio procedure for pain recently and too early to tell how it will work.  Never had it before.   Taking alprazolam  prn Taking gabapentin  100 mg prn.  Clonidine  0.05 mg HS. Paroxetine  60 mg daily. BP is better with less.   Anxiety worse middday when tremors are worse. Sleep 2 h then nocturia and then 1/2 hour to get back to sleep.  May take 1/2 hour to get back to bed DT PD slowness. Unsure how much sleep he's getting.  Not sure if reducing clonidine  increased the anxiety or not. No panic.  No crying spells.  Can't cry.    11/03/22 appt noted: with Joseph Arroyo clonidine .  He didn't notice any difference Arroyo of it. Had several procedures and trying to limit meds.  Still having pain issues.  Steroid injx have steroid which increases anxiety.  Not as high now but still there.  Xanax  0.25 mg prn (about 1 daily). Gabapentin  makes him drowsy in the AM Sleep 2 h then nocturia and then 1/2 hour to get back to sleep.  May take 1/2 hour to get back to bed DT PD slowness. Unsure how much sleep he's getting.  Doses a good bit in the day too. Steroids tend to affect mood also.   No other problems with meds.   Makes a point of getting out of the house.  Walks at grocery store and doing some PT.  Vertigo PT [place].  03/10/23 appt noted: with Joseph virtual Psych meds: paroxetine  60, gabapentin  100 mg BID and 200 mg HS for anxiety and pain, Xanax  0.25 mg BID prn Per wife doing pretty good with vertigo with a little at times.  PT helped.   Stable per pt.  At times feels anxious and tries to deal with it in prayer.  At times of day will be more anxious even if hungry.  Try to avoid triggers for anxiety.   No full panic  lately.  Will use Xanax  prn infrequently.   Some awakening and will pray and recite scripture.   No severe dep.  Gets down over his health at times. No specific concerns with meds.   SE some sleepiness in the AM with Sinemet .   Thankful Joseph Arroyo returned to [facility] gave them a hospital bed.    Was 12 years free of medication until this last relapse and yet it took a long time to get relief again.  Wife, Joseph, said overall he's been doing well.  No prn BZ in a couple of years.  Past Psychiatric Medication Trials: Under our care since 2013. Paroxetine  60, duloxetine, fluoxetine, Luvox, venlafaxine, Trintellix, ,  mirtazapine, doxepin,  Gabapentin  200 BID Deplin,  clonazepam, lorazepam, alprazolam   buspirone,   Clonidine  0.05 BID for anxiety lamotrigine,   Abilify, Seroquel, Zyprexa,  risperidone, Saphris,  lithium with some benefit,  Pramipexole    Review of Systems:  Review of Systems  Constitutional:  Positive for fatigue.  HENT:  Positive for voice change.   Cardiovascular:  Negative for palpitations.  Gastrointestinal:  Positive for diarrhea.  Musculoskeletal:  Positive for back pain and gait problem.  Neurological:  Positive for tremors and weakness. Negative for dizziness.  Psychiatric/Behavioral:  Negative for dysphoric mood. The patient is nervous/anxious.     Medications: I have reviewed the patient's current medications.  Current Outpatient Medications  Medication Sig Dispense Refill   acetaminophen  (TYLENOL ) 500 MG tablet Take 1,000 mg by mouth every 8 (eight) hours as needed.     B Complex Vitamins (VITAMIN B COMPLEX PO) Take 1 tablet by mouth daily.     baclofen  (LIORESAL ) 10 MG tablet Take 0.5-1 tablets (5-10 mg total) by mouth 3 (three) times daily as needed for muscle spasms. 90 each 1   Capsicum-Garlic 200-300 MG CAPS Take 200-300 mg by mouth.     carbidopa -levodopa  (SINEMET  IR) 25-100 MG tablet Take 2.5 tablets by mouth 3 (three) times daily.      ciclopirox (PENLAC) 8 % solution Apply topically at bedtime.     clobetasol (TEMOVATE) 0.05 % external solution Apply 1 Application topically daily.     HAWTHORNE BERRY PO Take by mouth daily.     L-Methylfolate 15 MG TABS Take 1 tablet (15 mg total) by mouth daily. 90 tablet 3   Lutein 10 MG TABS Take 1 tablet by mouth 3 (three) times daily. 1 daily     Naftifine HCl 2 % GEL Apply 1 Application topically daily.     oxyCODONE  (OXY IR/ROXICODONE ) 5 MG immediate release tablet Take 1 tablet (5 mg total) by mouth 3 (three) times daily as needed for severe pain (pain score 7-10) or moderate pain (pain score 4-6). 45 tablet 0   Saw Palmetto, Serenoa repens, 1000 MG CAPS Take 2 capsules by mouth daily.     ALPRAZolam  (XANAX ) 0.25 MG tablet Take 1 tablet (0.25 mg total) by mouth 3 (three) times daily as needed for anxiety. 90 tablet 0   gabapentin  (NEURONTIN ) 100 MG capsule 1 in AM and noon and 2 at night 360 capsule 1   PARoxetine  (PAXIL ) 20 MG tablet TAKE 1 TABLET BY MOUTH EVERY DAY (WITH THE 40 MG TABLET FOR 60 MG TOTAL) 90 tablet 1   PARoxetine  (PAXIL ) 40 MG tablet TAKE 1 TABLET BY MOUTH EVERY DAY (WITH THE  20 MG TABLET FOR 60 MG TOTAL) 90 tablet 1   No current facility-administered medications for this visit.    Medication Side Effects: Fatigue and Other: from Mirapex   esp in AM  Allergies:  Allergies  Allergen Reactions   Prednisone     Hyperactivity    Wheat Diarrhea    Past Medical History:  Diagnosis Date   Allergic rhinitis due to pollen 11/21/2007   Brachial neuritis or radiculitis NOS    Cervicalgia    Concussion    age 3 - s/p accident   Costal chondritis    Depression    Essential and other specified forms of tremor    GERD (gastroesophageal reflux disease)    in past   Lumbago    Occlusion and stenosis of carotid artery without mention of cerebral infarction    Seizures (HCC)    age 16 - after concussion    Family History  Problem Relation Age of Onset   Heart  failure Mother        enlarged heart    Asthma Father     Social History   Socioeconomic History   Marital status: Married    Spouse name: Joseph Arroyo   Number of children: 2   Years of education: 12   Highest education level: Master's degree (e.g., MA, MS, MEng, MEd, MSW, MBA)  Occupational History   Occupation: Retired Film/video Editor (English)    Employer: RETIRED  Tobacco Use   Smoking status: Never   Smokeless tobacco: Never  Vaping Use   Vaping status: Never Used  Substance and Sexual Activity   Alcohol use: No    Alcohol/week: 0.0 standard drinks of alcohol   Drug use: No   Sexual activity: Not Currently  Other Topics Concern   Not on file  Social History Narrative   Married to Joseph Arroyo, has 2 children, has grandchildren   Right handed   Master's plus   He is born in [place], heritage is French (Father's side), [place] (Mother's side)      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    Social Drivers of Corporate Investment Banker Strain: Low Risk  (06/03/2022)   Overall Financial Resource Strain (CARDIA)    Difficulty of Paying Living Expenses: Not hard at all  Food Insecurity: No Food Insecurity (06/08/2022)   Hunger Vital Sign    Worried About Running Out of Food in the Last Year: Never true    Ran Out of Food in the Last Year: Never true  Transportation Needs: No Transportation Needs (06/08/2022)   PRAPARE - Administrator, Civil Service (Medical): No    Lack of Transportation (Non-Medical): No  Physical Activity: Insufficiently Active (06/03/2022)   Exercise Vital Sign    Days of Exercise per Week: 3 days    Minutes of Exercise per Session: 30 min  Stress: No Stress Concern Present (06/03/2022)   [employer] of Occupational Health - Occupational Stress Questionnaire    Feeling of Stress : Only a little  Social Connections: Socially Integrated (06/03/2022)   Social Connection and Isolation Panel [NHANES]    Frequency of Communication with Friends and Family:  More than three times a week    Frequency of Social Gatherings with Friends and Family: Twice a week    Attends Religious Services: More than 4 times per year    Active Member of [employer] or Organizations: Yes    Attends Banker Meetings: 1 to 4 times per year  Marital Status: Married  Catering Manager Violence: Not At Risk (06/03/2022)   Humiliation, Afraid, Rape, and Kick questionnaire    Fear of Current or Ex-Partner: No    Emotionally Abused: No    Physically Abused: No    Sexually Abused: No    Past Medical History, Surgical history, Social history, and Family history were reviewed and updated as appropriate.   Please see review of systems for further details on the patient's review from today.   Objective:   Physical Exam:  There were no vitals taken for this visit.  Physical Exam Neurological:     Mental Status: He is alert and oriented to person, place, and time.     Cranial Nerves: No dysarthria.  Psychiatric:        Attention and Perception: Attention and perception normal.        Mood and Affect: Mood is anxious. Mood is not depressed.        Behavior: Behavior is cooperative.        Thought Content: Thought content normal. Thought content is not paranoid or delusional. Thought content does not include homicidal or suicidal ideation. Thought content does not include suicidal plan.        Cognition and Memory: Cognition and memory normal.        Judgment: Judgment normal.     Comments: Insight intact Voice change with PD Still residual sx      Lab Review:     Component Value Date/Time   NA 140 10/23/2022 0900   NA 143 07/10/2015 0821   K 4.2 10/23/2022 0900   CL 101 10/23/2022 0900   CO2 32 10/23/2022 0900   GLUCOSE 121 10/23/2022 0900   BUN 25 10/23/2022 0900   BUN 13 07/10/2015 0821   CREATININE 0.73 10/23/2022 0900   CALCIUM 9.1 10/23/2022 0900   PROT 6.4 10/02/2022 1433   PROT 6.8 07/10/2015 0821   ALBUMIN 4.1 03/24/2021 1351   ALBUMIN  4.2 07/10/2015 0821   AST 17 10/02/2022 1433   ALT 7 (L) 10/02/2022 1433   ALKPHOS 51 03/24/2021 1351   BILITOT 0.5 10/02/2022 1433   BILITOT 0.5 07/10/2015 0821   GFRNONAA >60 03/24/2021 1351   GFRNONAA 86 09/07/2019 0941   GFRAA 100 09/07/2019 0941       Component Value Date/Time   WBC 6.1 10/02/2022 1433   RBC 4.42 10/02/2022 1433   HGB 14.2 10/02/2022 1433   HGB 15.3 10/23/2014 1237   HCT 41.3 10/02/2022 1433   HCT 45.1 10/23/2014 1237   PLT 207 10/02/2022 1433   PLT 150 10/23/2014 1237   MCV 93.4 10/02/2022 1433   MCV 88 10/23/2014 1237   MCH 32.1 10/02/2022 1433   MCHC 34.4 10/02/2022 1433   RDW 11.7 10/02/2022 1433   RDW 13.3 10/23/2014 1237   LYMPHSABS 1,305 10/02/2022 1433   LYMPHSABS 1.7 10/23/2014 1237   MONOABS 357 07/24/2016 1050   EOSABS 201 10/02/2022 1433   EOSABS 0.3 10/23/2014 1237   BASOSABS 61 10/02/2022 1433   BASOSABS 0.1 10/23/2014 1237    No results found for: POCLITH, LITHIUM   No results found for: PHENYTOIN, PHENOBARB, VALPROATE, CBMZ   .res Assessment: Plan:    Rufino was seen today for follow-up, depression, anxiety, stress and medication reaction.  Diagnoses and all orders for this visit:  Recurrent major depression in partial remission (HCC) -     PARoxetine  (PAXIL ) 40 MG tablet; TAKE 1 TABLET BY MOUTH EVERY DAY (WITH THE 20  MG TABLET FOR 60 MG TOTAL) -     PARoxetine  (PAXIL ) 20 MG tablet; TAKE 1 TABLET BY MOUTH EVERY DAY (WITH THE 40 MG TABLET FOR 60 MG TOTAL) -     ALPRAZolam  (XANAX ) 0.25 MG tablet; Take 1 tablet (0.25 mg total) by mouth 3 (three) times daily as needed for anxiety.  GAD (generalized anxiety disorder) -     PARoxetine  (PAXIL ) 40 MG tablet; TAKE 1 TABLET BY MOUTH EVERY DAY (WITH THE 20 MG TABLET FOR 60 MG TOTAL) -     PARoxetine  (PAXIL ) 20 MG tablet; TAKE 1 TABLET BY MOUTH EVERY DAY (WITH THE 40 MG TABLET FOR 60 MG TOTAL) -     gabapentin  (NEURONTIN ) 100 MG capsule; 1 in AM and noon and 2 at night -      ALPRAZolam  (XANAX ) 0.25 MG tablet; Take 1 tablet (0.25 mg total) by mouth 3 (three) times daily as needed for anxiety.  Chronic pain syndrome   History of extremely severe TR depression and anxiety with multiple med failures.  Doing relatively well for 2-3 years.    But residual dep and anxiety sx contributed to by pain and advancing Parkinson's disease.  He has been consistent with the paroxetine  but would like for God to heal him so he can stop meds.    Still works out at gym.  Disc pricing L-methylfolate with [facility]. Continue paroxetine  60 mg daily Hesitate to increase paroxetine  right now   Parkinson's is better with meds. But not great. DT PD hesitate to use atypicals for TR anxiety.    Would not benefit from nortriptyline.    Continue gabapentin  for anxiety Arroyo label and pain 200 mg BID. Option reduce for energy  Ok Xanax  0.25 mg TID prn.  He's not using much, usually only 1 daily.   We discussed the short-term risks associated with benzodiazepines including sedation and increased fall risk among others.  Discussed long-term side effect risk including dependence, potential withdrawal symptoms, and the potential eventual dose-related risk of dementia.  But recent studies from 2020 dispute this association between benzodiazepines and dementia risk. Newer studies in 2020 do not support an association with dementia.  Counseling 20 min on dealing with health problems and faith.    Tolerating and benefiting from meds.    Answered questions about Vitamin Joseph .  He's taking 4000 units  No med changes  FU 4-6 mos  Lorene Macintosh, MD, DFAPA   Please see After Visit Summary for patient specific instructions.  Future Appointments  Date Time Provider Department Center  03/22/2023 10:00 AM Karoline Lima, RN [facility] None  04/27/2023 10:40 AM Edman, Marsa PARAS, DO [facility]    No orders of the defined types were placed in this  encounter.    -------------------------------

## 2023-03-11 ENCOUNTER — Ambulatory Visit: Payer: Self-pay

## 2023-03-16 ENCOUNTER — Encounter: Payer: Medicare HMO | Admitting: Occupational Therapy

## 2023-03-16 ENCOUNTER — Ambulatory Visit: Payer: Medicare HMO | Admitting: Physical Therapy

## 2023-03-18 ENCOUNTER — Ambulatory Visit: Payer: Self-pay

## 2023-03-22 ENCOUNTER — Other Ambulatory Visit: Payer: No Typology Code available for payment source

## 2023-03-22 NOTE — Patient Outreach (Signed)
Care Management   Visit Note  03/22/2023 Name: Joseph Arroyo MRN: [id number] DOB: 1940-06-13  Subjective: Joseph Arroyo is a 83 y.o. year old male who is a primary care patient of Smitty Cords, DO. The Care Management team was consulted for assistance.      Engaged with patient and spouse via telephone.  Assessment:  Review of patient past medical history, allergies, medications, health status, including review of consultants reports, laboratory and other test data, was performed as part of  evaluation and provision of care management services.   Outpatient Encounter Medications as of 03/22/2023  Medication Sig   acetaminophen (TYLENOL) 500 MG tablet Take 1,000 mg by mouth every 8 (eight) hours as needed.   ALPRAZolam (XANAX) 0.25 MG tablet Take 1 tablet (0.25 mg total) by mouth 3 (three) times daily as needed for anxiety.   B Complex Vitamins (VITAMIN B COMPLEX PO) Take 1 tablet by mouth daily.   baclofen (LIORESAL) 10 MG tablet Take 0.5-1 tablets (5-10 mg total) by mouth 3 (three) times daily as needed for muscle spasms.   Capsicum-Garlic 200-300 MG CAPS Take 409-811 mg by mouth.   carbidopa-levodopa (SINEMET IR) 25-100 MG tablet Take 2.5 tablets by mouth 3 (three) times daily.   ciclopirox (PENLAC) 8 % solution Apply topically at bedtime.   clobetasol (TEMOVATE) 0.05 % external solution Apply 1 Application topically daily.   gabapentin (NEURONTIN) 100 MG capsule 1 in AM and noon and 2 at night   HAWTHORNE BERRY PO Take by mouth daily.   L-Methylfolate 15 MG TABS Take 1 tablet (15 mg total) by mouth daily.   Lutein 10 MG TABS Take 1 tablet by mouth 3 (three) times daily. 1 daily   Naftifine HCl 2 % GEL Apply 1 Application topically daily.   oxyCODONE (OXY IR/ROXICODONE) 5 MG immediate release tablet Take 1 tablet (5 mg total) by mouth 3 (three) times daily as needed for severe pain (pain score 7-10) or moderate pain (pain score 4-6).   PARoxetine (PAXIL) 20 MG  tablet TAKE 1 TABLET BY MOUTH EVERY DAY (WITH THE 40 MG TABLET FOR 60 MG TOTAL)   PARoxetine (PAXIL) 40 MG tablet TAKE 1 TABLET BY MOUTH EVERY DAY (WITH THE 20 MG TABLET FOR 60 MG TOTAL)   Saw Palmetto, Serenoa repens, 1000 MG CAPS Take 2 capsules by mouth daily.   No facility-administered encounter medications on file as of 03/22/2023.    Goals Addressed             This Visit's Progress    RNCM Care Management  Expected Outcome:  Monitor, Self-Manage and Reduce Symptoms of: Anxiety       Current Barriers:  Care Management support and education needs related to Anxiety  Planned Interventions: Reviewed current treatment plan related to anxiety with patient's spouse/caregiver. Reviewed medications. Spouse prepares medications. Notes Xanax is ordered for anxiety, however patient has not used them often.  Discussed symptoms and current mood. Spouse reports overall patient is doing fairly well. Describes his mood as "off and on" Notes he enjoys walking and she attempts to take him out at least two to three days a week. Discussed current support system. Spouse reports very good support from friends and church members. Reports church members have helped significantly with ensuring patient has needed equipment and supplies. She reports considering plan for long term care. Feels that he is currently safe in the home but notes considering plan if his functional status declines. Discussed options available via  [facility]. She reports previously receiving assistance three times a year. Agreed to update the care team if additional assistance is required in the home. Reviewed safety and fall prevention measures. Reports patient is primarily using a walker with ambulation. Also notes he was previously receiving vestibular therapy which has improved his ambulation.  Screening for signs and symptoms of depression related to chronic disease state  Assessed social determinant of health barriers.   Discussed plan for ongoing care management. Spouse agreed to keep provider and care team updated of changes in patient's cognitive and functional status.   Symptom Management: Ensure patient takes medications as prescribed   Ensure patient attends all scheduled provider appointments Follow recommended safety and fall prevention measures Call provider office for new concerns or questions  Call the Suicide and Crisis Lifeline: 988 Call the [place] National Suicide Prevention Lifeline: 619-349-7665 or TTY: (726) 379-9245 TTY 813-502-3918) to talk to a trained counselor Call 1-800-273-TALK (toll free, 24 hour hotline) if patient is experiencing a Mental Health or Behavioral Health Crisis         RNCM Care Management Expected Outcome:  Monitor, Self-Manage and Reduce Symptoms of: Arthritis Spine, Back Pain       Current Barriers:  Care Management support and education needs related to Chronic Pain and Arthritis.   Planned Interventions:  Reviewed provider established plan for pain management with patient's spouse. Spouse reports patient's pain has been well controlled. Patient is currently being followed by the Pain Clinic/Dr. Naviero. Spouse reports an order was also placed for oxycodone and he has not used it often. Reviewed safety and fall prevention measures. Reports he does well ambulating with his walker. He completed OT and PT with goals met following a procedure on his back. Notes he is currently completing vestibular therapy. She feels his balance and mobility have improved. Current plan is to continue light activity/walking two to three times a week. Spouse is aware of need to avoid prolonged activity to avoid accidental falls r/t exertion. Advised to continue using assistive device with all ambulation. Discussed concerns related to pain to the top of his feet. Spouse recall patient previously being treated for toenail fungus and unsure if the redness and discomfort to the top of his feet  is related to the treatment. Notes patient has complained of "burning" sensation. She notes when he complains, the area is usually red and warm to touch She denies open wounds, drainage or swelling to patient's feet. Declined need for urgent evaluation as she recalls this occurring for several weeks. She is agreeable to follow up with PCP if indicated. Will relay message and follow up with patient and spouse this week. Reviewed s/sx of infection and indications for seeking immediate medical attention.   Symptom Management: Ensure patient takes medications as prescribed   Ensure patient attends all scheduled provider appointments Follow recommended safety and fall prevention measures Monitor patient's feet and seek medical attention if symptoms worsen Call provider office for new concerns or questions           PLAN Will follow up this week   Juanell Fairly [facility] RN Care Manager Direct Dial: [phone]  Fax: [phone] Website: [email or web address]

## 2023-03-23 ENCOUNTER — Ambulatory Visit: Payer: Medicare HMO

## 2023-03-24 ENCOUNTER — Other Ambulatory Visit: Payer: Self-pay

## 2023-03-25 ENCOUNTER — Ambulatory Visit: Payer: Self-pay

## 2023-03-26 ENCOUNTER — Encounter: Payer: Self-pay | Admitting: Family Medicine

## 2023-03-26 ENCOUNTER — Ambulatory Visit (INDEPENDENT_AMBULATORY_CARE_PROVIDER_SITE_OTHER): Payer: No Typology Code available for payment source | Admitting: Family Medicine

## 2023-03-26 VITALS — BP 122/74 | HR 54 | Ht 67.0 in | Wt 124.0 lb

## 2023-03-26 DIAGNOSIS — G20B2 Parkinson's disease with dyskinesia, with fluctuations: Secondary | ICD-10-CM | POA: Diagnosis not present

## 2023-03-26 DIAGNOSIS — I739 Peripheral vascular disease, unspecified: Secondary | ICD-10-CM

## 2023-03-26 DIAGNOSIS — I872 Venous insufficiency (chronic) (peripheral): Secondary | ICD-10-CM

## 2023-03-26 DIAGNOSIS — G6289 Other specified polyneuropathies: Secondary | ICD-10-CM | POA: Diagnosis not present

## 2023-03-26 DIAGNOSIS — I878 Other specified disorders of veins: Secondary | ICD-10-CM | POA: Diagnosis not present

## 2023-03-26 NOTE — Progress Notes (Signed)
Subjective:    Patient ID: Joseph Arroyo, male    DOB: [birth year], 83 y.o.   MRN: [id number]  Joseph Arroyo is a 83 y.o. male presenting on 03/26/2023 for Foot Swelling discoloration   HPI  Discussed the use of AI scribe software for clinical note transcription with the patient, who gave verbal consent to proceed.  History of Present Illness    The patient, with a known history of Parkinson's disease, presents with complaints of intermittent burning sensation and redness in the feet, particularly at night. The patient reports that the symptoms are not constant but tend to occur daily, with the feet becoming "terribly red" and feeling "on fire." The patient also notes that the symptoms seem to alternate between the left and right foot, with the left foot more frequently affected. The patient has been managing the rigidity associated with Parkinson's disease with Carbidopa-Levodopa and Gabapentin, which reportedly provides immediate relief to the feet when taken.  In addition to the foot discomfort, the patient reports difficulty in maintaining an upright posture, which has been a long-standing issue. The patient expresses interest in seeking help for this issue, having previously sought chiropractic care and physical therapy. The patient has recently completed a course of physical therapy and occupational therapy, and is considering speech therapy.  The patient also reports a history of back pain, which has been evaluated at a pain clinic. The patient was informed that due to the complexity of his back condition, relief might be achieved in one area but not in others. The patient has previously undergone a procedure at the pain clinic, which he is hesitant to repeat.  The patient has been proactive in maintaining physical activity, with regular walks in stores for flat surfaces. However, the patient reports episodes of "freezing," where he experiences difficulty in walking or standing  straight. The patient is scheduled to see a neurologist next month for further evaluation of these symptoms.   The patient is interested in natural remedies and is open to trying a supplement for nerve health.         01/27/2023    1:37 PM 10/13/2022   10:01 AM 08/04/2022    8:23 AM  Depression screen PHQ 2/9  Decreased Interest 0 0 0  Down, Depressed, Hopeless 0  0  PHQ - 2 Score 0 0 0       05/26/2022    4:13 PM 03/17/2021    3:49 PM 10/01/2020    1:28 PM  GAD 7 : Generalized Anxiety Score  Nervous, Anxious, on Edge 2 1 0  Control/stop worrying 1 0 0  Worry too much - different things 1 0 0  Trouble relaxing 1 0 0  Restless 0 0 0  Easily annoyed or irritable 1 0 0  Afraid - awful might happen 0 0 0  Total GAD 7 Score 6 1 0  Anxiety Difficulty Not difficult at all Not difficult at all Not difficult at all    Social History   Tobacco Use   Smoking status: Never   Smokeless tobacco: Never  Vaping Use   Vaping status: Never Used  Substance Use Topics   Alcohol use: No    Alcohol/week: 0.0 standard drinks of alcohol   Drug use: No    Review of Systems Per HPI unless specifically indicated above     Objective:    BP 122/74   Pulse (!) 54   Ht 5\' 7"  (1.702 m)   Wt 124 lb (  56.2 kg)   BMI 19.42 kg/m   Wt Readings from Last 3 Encounters:  03/26/23 124 lb (56.2 kg)  01/27/23 121 lb (54.9 kg)  01/25/23 121 lb 3.2 oz (55 kg)    Physical Exam Vitals and nursing note reviewed.  Constitutional:      General: He is not in acute distress.    Appearance: He is well-developed. He is not diaphoretic.     Comments: Chronically ill 83 yr male  HENT:     Head: Normocephalic and atraumatic.  Eyes:     General:        Right eye: No discharge.        Left eye: No discharge.     Conjunctiva/sclera: Conjunctivae normal.  Neck:     Thyroid: No thyromegaly.  Cardiovascular:     Rate and Rhythm: Regular rhythm. Bradycardia present.     Pulses: Normal pulses.     Heart  sounds: Normal heart sounds. No murmur heard. Pulmonary:     Effort: Pulmonary effort is normal. No respiratory distress.     Breath sounds: Normal breath sounds. No wheezing or rales.  Musculoskeletal:     Cervical back: Normal range of motion and neck supple.     Right lower leg: Edema (trace edema) present.     Left lower leg: Edema (trace edema) present.     Comments: Seated, has manual walker Difficulty with back posture Able to stand with equipment assistance and ambulation is slower requires assistance to stand and ambulate. Some loss of muscle mass density  Lymphadenopathy:     Cervical: No cervical adenopathy.  Skin:    General: Skin is warm and dry.     Findings: No erythema or rash.     Comments: Discoloration of bilateral feet toes mostly L>R with some red vs darker discoloration, touch blanches easily, has intact distal pulses, some cool to touch at ends of toes.  Neurological:     Mental Status: He is alert and oriented to person, place, and time. Mental status is at baseline.     Motor: Weakness present.     Gait: Gait abnormal.     Comments: Tremors resting.  Psychiatric:        Behavior: Behavior normal.     Comments: Well groomed, good eye contact, normal speech and thoughts     Results for orders placed or performed in visit on 10/23/22  BASIC METABOLIC PANEL WITH GFR   Collection Time: 10/23/22  9:00 AM  Result Value Ref Range   Glucose, Bld 121 65 - 139 mg/dL   BUN 25 7 - 25 mg/dL   Creat 4.09 8.11 - 9.14 mg/dL   eGFR 92 > OR = 60 NW/GNF/6.21H0   BUN/Creatinine Ratio SEE NOTE: 6 - 22 (calc)   Sodium 140 135 - 146 mmol/L   Potassium 4.2 3.5 - 5.3 mmol/L   Chloride 101 98 - 110 mmol/L   CO2 32 20 - 32 mmol/L   Calcium 9.1 8.6 - 10.3 mg/dL  TSH   Collection Time: 10/23/22  9:00 AM  Result Value Ref Range   TSH 0.99 0.40 - 4.50 mIU/L  Lipid panel   Collection Time: 10/23/22  9:00 AM  Result Value Ref Range   Cholesterol 141 <200 mg/dL   HDL 66 > OR =  40 mg/dL   Triglycerides 53 <865 mg/dL   LDL Cholesterol (Calc) 62 mg/dL (calc)   Total CHOL/HDL Ratio 2.1 <5.0 (calc)   Non-HDL Cholesterol (Calc) 75 <784  mg/dL (calc)  Hemoglobin J4N   Collection Time: 10/23/22  9:00 AM  Result Value Ref Range   Hgb A1c MFr Bld 5.2 <5.7 % of total Hgb   Mean Plasma Glucose 103 mg/dL   eAG (mmol/L) 5.7 mmol/L      Assessment & Plan:   Problem List Items Addressed This Visit     PAD (peripheral artery disease) (HCC) (Chronic)   Parkinson's disease with dyskinesia (HCC) - Primary (Chronic)   Other Visit Diagnoses       Venous stasis dermatitis         Venous stasis of both lower extremities         Other polyneuropathy            Bilateral Toes/Foot Discoloration and Warmth Likely related to circulation issues, possibly exacerbated by muscle rigidity and neuropathy. No signs of infection or gout.  RICE therapy as tolerated -Emphasis on elevate feet above heart level when possible. -Consider use of compression stockings if tolerable. -Continue current exercise regimen to promote circulation. -Try alpha lipoic acid supplement (600mg  up to 3 times daily) for nerve health.  Parkinsons / Scoliosis Currently with concerns of Posture and Mobility Issues Difficulty sitting and standing straight, possibly due to structural issues and muscle rigidity. -Continue physical therapy and exercises to maintain current level of mobility and strength. -Consider revisiting pain clinic for possible epidural treatment.  Colon Cancer Screening Patient is over 78 and has the option to continue screening up to age 34. Discussed risks and benefits of further screening, including potential need for colonoscopy if Cologuard test is abnormal. Last cologuard 2021, due in 2024, now overdue. If interested in repeat. -Patient to consider and decide on further screening.  Pain Management Current regimen appears sufficient. -Continue current pain management regimen.   He does not require refill today        No orders of the defined types were placed in this encounter.   No orders of the defined types were placed in this encounter.   Follow up plan: Return if symptoms worsen or fail to improve.   Saralyn Pilar, DO [facility] 03/26/2023, 10:45 AM

## 2023-03-26 NOTE — Patient Instructions (Addendum)
Thank you for coming to the office today.  Recommend Alpha Lipoic Acid 600mg  supplement up to max of 3 times per day nerve health supplement. - Helps nerve endings heal and reduce nerve damage  Okay to keep Gabapentin  You are eligible for repeat Cologuard, please let me know if interested.  Okay to proceed with PT as planned. Goal is to work on postural strength to help.   Keep moving to help circulation  Try wedge pillow to elevated legs in evening.  I do think the discoloration is circulation related mostly.  Use RICE therapy: - R - Rest / relative rest with activity modification avoid overuse of joint - I - Ice packs (make sure you use a towel or sock / something to protect skin) - C - Compression with ACE wrap to apply pressure and reduce swelling allowing more support - if needed - E - Elevation - if significant swelling, lift leg above heart level (toes above your nose) to help reduce swelling, most helpful at night after day of being on your feet  If worsening symptoms - significant pain, purple or darker discoloration, very cold, loss of pulse or movement - we may need to get immediate evaluation.  Please schedule a Follow-up Appointment to: Return if symptoms worsen or fail to improve.  If you have any other questions or concerns, please feel free to call the office or send a message through MyChart. You may also schedule an earlier appointment if necessary.  Additionally, you may be receiving a survey about your experience at our office within a few days to 1 week by e-mail or mail. We value your feedback.  Saralyn Pilar, DO [facility], [place]

## 2023-03-30 ENCOUNTER — Ambulatory Visit: Payer: Medicare HMO

## 2023-04-01 ENCOUNTER — Ambulatory Visit: Payer: Self-pay

## 2023-04-06 ENCOUNTER — Ambulatory Visit: Payer: Medicare HMO | Admitting: Physical Therapy

## 2023-04-07 DIAGNOSIS — G20B1 Parkinson's disease with dyskinesia, without mention of fluctuations: Secondary | ICD-10-CM | POA: Diagnosis not present

## 2023-04-07 DIAGNOSIS — G20A1 Parkinson's disease without dyskinesia, without mention of fluctuations: Secondary | ICD-10-CM | POA: Diagnosis not present

## 2023-04-08 ENCOUNTER — Ambulatory Visit: Payer: BC Managed Care – PPO

## 2023-04-12 DIAGNOSIS — M9905 Segmental and somatic dysfunction of pelvic region: Secondary | ICD-10-CM | POA: Diagnosis not present

## 2023-04-12 DIAGNOSIS — M9903 Segmental and somatic dysfunction of lumbar region: Secondary | ICD-10-CM | POA: Diagnosis not present

## 2023-04-12 DIAGNOSIS — M5137 Other intervertebral disc degeneration, lumbosacral region: Secondary | ICD-10-CM | POA: Diagnosis not present

## 2023-04-12 DIAGNOSIS — M5136 Other intervertebral disc degeneration, lumbar region: Secondary | ICD-10-CM | POA: Diagnosis not present

## 2023-04-12 DIAGNOSIS — M25551 Pain in right hip: Secondary | ICD-10-CM | POA: Diagnosis not present

## 2023-04-12 DIAGNOSIS — M9904 Segmental and somatic dysfunction of sacral region: Secondary | ICD-10-CM | POA: Diagnosis not present

## 2023-04-12 DIAGNOSIS — M7918 Myalgia, other site: Secondary | ICD-10-CM | POA: Diagnosis not present

## 2023-04-12 DIAGNOSIS — M25552 Pain in left hip: Secondary | ICD-10-CM | POA: Diagnosis not present

## 2023-04-12 DIAGNOSIS — M5451 Vertebrogenic low back pain: Secondary | ICD-10-CM | POA: Diagnosis not present

## 2023-04-13 ENCOUNTER — Ambulatory Visit: Payer: Medicare HMO | Admitting: Physical Therapy

## 2023-04-13 DIAGNOSIS — M5137 Other intervertebral disc degeneration, lumbosacral region with discogenic back pain only: Secondary | ICD-10-CM | POA: Diagnosis not present

## 2023-04-13 DIAGNOSIS — M5136 Other intervertebral disc degeneration, lumbar region: Secondary | ICD-10-CM | POA: Diagnosis not present

## 2023-04-13 DIAGNOSIS — M9903 Segmental and somatic dysfunction of lumbar region: Secondary | ICD-10-CM | POA: Diagnosis not present

## 2023-04-13 DIAGNOSIS — M5451 Vertebrogenic low back pain: Secondary | ICD-10-CM | POA: Diagnosis not present

## 2023-04-13 DIAGNOSIS — M9904 Segmental and somatic dysfunction of sacral region: Secondary | ICD-10-CM | POA: Diagnosis not present

## 2023-04-13 DIAGNOSIS — M25552 Pain in left hip: Secondary | ICD-10-CM | POA: Diagnosis not present

## 2023-04-13 DIAGNOSIS — M25551 Pain in right hip: Secondary | ICD-10-CM | POA: Diagnosis not present

## 2023-04-13 DIAGNOSIS — M9905 Segmental and somatic dysfunction of pelvic region: Secondary | ICD-10-CM | POA: Diagnosis not present

## 2023-04-13 DIAGNOSIS — M7918 Myalgia, other site: Secondary | ICD-10-CM | POA: Diagnosis not present

## 2023-04-13 NOTE — Patient Outreach (Signed)
Care Management   Visit Note   Name: Joseph Arroyo MRN: [id number] DOB: 09-Nov-1940  Subjective: Joseph Arroyo is a 83 y.o. year old male who is a primary care patient of Smitty Cords, DO. The Care Management team was consulted for assistance.      Engaged with patient   Assessment:  Review of patient past medical history, allergies, medications, health status, including review of consultants reports, laboratory and other test data, was performed as part of  evaluation and provision of care management services.    Outpatient Encounter Medications as of 03/24/2023  Medication Sig   acetaminophen (TYLENOL) 500 MG tablet Take 1,000 mg by mouth every 8 (eight) hours as needed.   ALPRAZolam (XANAX) 0.25 MG tablet Take 1 tablet (0.25 mg total) by mouth 3 (three) times daily as needed for anxiety.   B Complex Vitamins (VITAMIN B COMPLEX PO) Take 1 tablet by mouth daily.   baclofen (LIORESAL) 10 MG tablet Take 0.5-1 tablets (5-10 mg total) by mouth 3 (three) times daily as needed for muscle spasms.   Capsicum-Garlic 200-300 MG CAPS Take 782-956 mg by mouth.   carbidopa-levodopa (SINEMET IR) 25-100 MG tablet Take 2.5 tablets by mouth 3 (three) times daily.   ciclopirox (PENLAC) 8 % solution Apply topically at bedtime.   clobetasol (TEMOVATE) 0.05 % external solution Apply 1 Application topically daily.   gabapentin (NEURONTIN) 100 MG capsule 1 in AM and noon and 2 at night   HAWTHORNE BERRY PO Take by mouth daily.   L-Methylfolate 15 MG TABS Take 1 tablet (15 mg total) by mouth daily.   Lutein 10 MG TABS Take 1 tablet by mouth 3 (three) times daily. 1 daily   Naftifine HCl 2 % GEL Apply 1 Application topically daily.   oxyCODONE (OXY IR/ROXICODONE) 5 MG immediate release tablet Take 1 tablet (5 mg total) by mouth 3 (three) times daily as needed for severe pain (pain score 7-10) or moderate pain (pain score 4-6).   PARoxetine (PAXIL) 20 MG tablet TAKE 1 TABLET BY MOUTH  EVERY DAY (WITH THE 40 MG TABLET FOR 60 MG TOTAL)   PARoxetine (PAXIL) 40 MG tablet TAKE 1 TABLET BY MOUTH EVERY DAY (WITH THE 20 MG TABLET FOR 60 MG TOTAL)   Saw Palmetto, Serenoa repens, 1000 MG CAPS Take 2 capsules by mouth daily.   No facility-administered encounter medications on file as of 03/24/2023.    Interventions:  Goals Addressed             This Visit's Progress    RNCM Care Management Expected Outcome:  Monitor, Self-Manage and Reduce Symptoms of: Arthritis Spine, Back Pain       Current Barriers:  Care Management support and education needs related to Chronic Pain and Arthritis.   Planned Interventions:  Reviewed provider established plan for pain management with patient's spouse. Spouse reports patient's pain has been well controlled. Patient is currently being followed by the Pain Clinic/Dr. Naviero. Spouse reports an order was also placed for oxycodone and he has not used it often. Reviewed safety and fall prevention measures. Reports he does well ambulating with his walker. He completed OT and PT with goals met following a procedure on his back. Notes he is currently completing vestibular therapy. She feels his balance and mobility have improved. Current plan is to continue light activity/walking two to three times a week. Spouse is aware of need to avoid prolonged activity to avoid accidental falls r/t exertion. Advised to continue using  assistive device with all ambulation. Discussed concerns related to pain to the top of his feet. Spouse recall patient previously being treated for toenail fungus and unsure if the redness and discomfort to the top of his feet is related to the treatment. Notes patient has complained of "burning" sensation. She notes when he complains, the area is usually red and warm to touch She denies open wounds, drainage or swelling to patient's feet. Declined need for urgent evaluation as she recalls this occurring for several weeks. She is agreeable  to follow up with PCP if indicated. Will relay message and follow up with patient and spouse this week. Reviewed s/sx of infection and indications for seeking immediate medical attention. Update 03/24/23: Follow up with patient's spouse/caregiver [name]. Reports patient remains stable. No falls or worsening concerns since outreach. Advised that appointment has been scheduled for PCP to evaluate discoloration and burning to feet on 03/26/23. She agreed to seek immediate medical attention if symptoms worsen prior to clinic visit. Agreed to contact clinic directly is appointment need to be rescheduled.    Symptom Management: Ensure patient takes medications as prescribed   Ensure patient attends all scheduled provider appointments Follow up with PCP as scheduled on 03/26/23 Follow recommended safety and fall prevention measures Monitor patient's feet and seek medical attention if symptoms worsen Call provider office for new concerns or questions          PLAN Will follow up next month   Juanell Fairly [facility] RN Care Manager Direct Dial: [phone]  Fax: [phone] Website: [email or web address]

## 2023-04-15 ENCOUNTER — Ambulatory Visit: Payer: BC Managed Care – PPO

## 2023-04-15 DIAGNOSIS — M25551 Pain in right hip: Secondary | ICD-10-CM | POA: Diagnosis not present

## 2023-04-15 DIAGNOSIS — M7918 Myalgia, other site: Secondary | ICD-10-CM | POA: Diagnosis not present

## 2023-04-15 DIAGNOSIS — M9904 Segmental and somatic dysfunction of sacral region: Secondary | ICD-10-CM | POA: Diagnosis not present

## 2023-04-15 DIAGNOSIS — M5451 Vertebrogenic low back pain: Secondary | ICD-10-CM | POA: Diagnosis not present

## 2023-04-15 DIAGNOSIS — M9905 Segmental and somatic dysfunction of pelvic region: Secondary | ICD-10-CM | POA: Diagnosis not present

## 2023-04-15 DIAGNOSIS — M5137 Other intervertebral disc degeneration, lumbosacral region with discogenic back pain only: Secondary | ICD-10-CM | POA: Diagnosis not present

## 2023-04-15 DIAGNOSIS — M5136 Other intervertebral disc degeneration, lumbar region: Secondary | ICD-10-CM | POA: Diagnosis not present

## 2023-04-15 DIAGNOSIS — M25552 Pain in left hip: Secondary | ICD-10-CM | POA: Diagnosis not present

## 2023-04-15 DIAGNOSIS — M9903 Segmental and somatic dysfunction of lumbar region: Secondary | ICD-10-CM | POA: Diagnosis not present

## 2023-04-19 DIAGNOSIS — M25552 Pain in left hip: Secondary | ICD-10-CM | POA: Diagnosis not present

## 2023-04-19 DIAGNOSIS — M5137 Other intervertebral disc degeneration, lumbosacral region with discogenic back pain only: Secondary | ICD-10-CM | POA: Diagnosis not present

## 2023-04-19 DIAGNOSIS — M9905 Segmental and somatic dysfunction of pelvic region: Secondary | ICD-10-CM | POA: Diagnosis not present

## 2023-04-19 DIAGNOSIS — M7918 Myalgia, other site: Secondary | ICD-10-CM | POA: Diagnosis not present

## 2023-04-19 DIAGNOSIS — M9903 Segmental and somatic dysfunction of lumbar region: Secondary | ICD-10-CM | POA: Diagnosis not present

## 2023-04-19 DIAGNOSIS — M9904 Segmental and somatic dysfunction of sacral region: Secondary | ICD-10-CM | POA: Diagnosis not present

## 2023-04-19 DIAGNOSIS — M25551 Pain in right hip: Secondary | ICD-10-CM | POA: Diagnosis not present

## 2023-04-19 DIAGNOSIS — M5451 Vertebrogenic low back pain: Secondary | ICD-10-CM | POA: Diagnosis not present

## 2023-04-19 DIAGNOSIS — M5136 Other intervertebral disc degeneration, lumbar region: Secondary | ICD-10-CM | POA: Diagnosis not present

## 2023-04-20 ENCOUNTER — Ambulatory Visit: Payer: Medicare HMO

## 2023-04-20 DIAGNOSIS — M9903 Segmental and somatic dysfunction of lumbar region: Secondary | ICD-10-CM | POA: Diagnosis not present

## 2023-04-20 DIAGNOSIS — M5136 Other intervertebral disc degeneration, lumbar region: Secondary | ICD-10-CM | POA: Diagnosis not present

## 2023-04-20 DIAGNOSIS — M9905 Segmental and somatic dysfunction of pelvic region: Secondary | ICD-10-CM | POA: Diagnosis not present

## 2023-04-20 DIAGNOSIS — M7918 Myalgia, other site: Secondary | ICD-10-CM | POA: Diagnosis not present

## 2023-04-20 DIAGNOSIS — M25551 Pain in right hip: Secondary | ICD-10-CM | POA: Diagnosis not present

## 2023-04-20 DIAGNOSIS — M25552 Pain in left hip: Secondary | ICD-10-CM | POA: Diagnosis not present

## 2023-04-20 DIAGNOSIS — M5137 Other intervertebral disc degeneration, lumbosacral region with discogenic back pain only: Secondary | ICD-10-CM | POA: Diagnosis not present

## 2023-04-20 DIAGNOSIS — M9904 Segmental and somatic dysfunction of sacral region: Secondary | ICD-10-CM | POA: Diagnosis not present

## 2023-04-20 DIAGNOSIS — M5451 Vertebrogenic low back pain: Secondary | ICD-10-CM | POA: Diagnosis not present

## 2023-04-21 NOTE — Patient Outreach (Signed)
  Care Management    04/21/2023 Name: Joseph Arroyo MRN: [id number] DOB: [birth year]  Subjective: Joseph Arroyo is a 83 y.o. year old male who is a primary care patient of Smitty Cords, DO. The Care Management team was consulted for assistance.      Message received from clinic staff regarding patient's request to reschedule pending care management outreach. Appointment rescheduled from 04/22/23 to 05/20/23. Patient will call or contact clinic if assistance is required prior to rescheduled outreach.   PLAN Will follow up on 05/20/23    Juanell Fairly [facility] RN Care Manager Direct Dial: [phone]  Fax: [phone] Website: [email or web address]

## 2023-04-22 ENCOUNTER — Ambulatory Visit: Payer: BC Managed Care – PPO

## 2023-04-22 ENCOUNTER — Other Ambulatory Visit: Payer: No Typology Code available for payment source

## 2023-04-26 DIAGNOSIS — M9905 Segmental and somatic dysfunction of pelvic region: Secondary | ICD-10-CM | POA: Diagnosis not present

## 2023-04-26 DIAGNOSIS — M25552 Pain in left hip: Secondary | ICD-10-CM | POA: Diagnosis not present

## 2023-04-26 DIAGNOSIS — M9903 Segmental and somatic dysfunction of lumbar region: Secondary | ICD-10-CM | POA: Diagnosis not present

## 2023-04-26 DIAGNOSIS — M5451 Vertebrogenic low back pain: Secondary | ICD-10-CM | POA: Diagnosis not present

## 2023-04-26 DIAGNOSIS — M7918 Myalgia, other site: Secondary | ICD-10-CM | POA: Diagnosis not present

## 2023-04-26 DIAGNOSIS — M25551 Pain in right hip: Secondary | ICD-10-CM | POA: Diagnosis not present

## 2023-04-26 DIAGNOSIS — M5137 Other intervertebral disc degeneration, lumbosacral region with discogenic back pain only: Secondary | ICD-10-CM | POA: Diagnosis not present

## 2023-04-26 DIAGNOSIS — M9904 Segmental and somatic dysfunction of sacral region: Secondary | ICD-10-CM | POA: Diagnosis not present

## 2023-04-26 DIAGNOSIS — M5136 Other intervertebral disc degeneration, lumbar region: Secondary | ICD-10-CM | POA: Diagnosis not present

## 2023-04-27 ENCOUNTER — Ambulatory Visit: Payer: Medicare HMO

## 2023-04-27 ENCOUNTER — Ambulatory Visit (INDEPENDENT_AMBULATORY_CARE_PROVIDER_SITE_OTHER): Payer: 59 | Admitting: Family Medicine

## 2023-04-27 ENCOUNTER — Encounter: Payer: Self-pay | Admitting: Family Medicine

## 2023-04-27 VITALS — BP 110/70 | HR 62 | Ht 67.0 in | Wt 125.0 lb

## 2023-04-27 DIAGNOSIS — G894 Chronic pain syndrome: Secondary | ICD-10-CM | POA: Diagnosis not present

## 2023-04-27 DIAGNOSIS — M7918 Myalgia, other site: Secondary | ICD-10-CM | POA: Diagnosis not present

## 2023-04-27 DIAGNOSIS — I878 Other specified disorders of veins: Secondary | ICD-10-CM | POA: Diagnosis not present

## 2023-04-27 DIAGNOSIS — G6289 Other specified polyneuropathies: Secondary | ICD-10-CM

## 2023-04-27 DIAGNOSIS — M9905 Segmental and somatic dysfunction of pelvic region: Secondary | ICD-10-CM | POA: Diagnosis not present

## 2023-04-27 DIAGNOSIS — M25551 Pain in right hip: Secondary | ICD-10-CM | POA: Diagnosis not present

## 2023-04-27 DIAGNOSIS — M5451 Vertebrogenic low back pain: Secondary | ICD-10-CM | POA: Diagnosis not present

## 2023-04-27 DIAGNOSIS — M9903 Segmental and somatic dysfunction of lumbar region: Secondary | ICD-10-CM | POA: Diagnosis not present

## 2023-04-27 DIAGNOSIS — M5136 Other intervertebral disc degeneration, lumbar region: Secondary | ICD-10-CM | POA: Diagnosis not present

## 2023-04-27 DIAGNOSIS — G20B2 Parkinson's disease with dyskinesia, with fluctuations: Secondary | ICD-10-CM

## 2023-04-27 DIAGNOSIS — M5137 Other intervertebral disc degeneration, lumbosacral region with discogenic back pain only: Secondary | ICD-10-CM | POA: Diagnosis not present

## 2023-04-27 DIAGNOSIS — M9904 Segmental and somatic dysfunction of sacral region: Secondary | ICD-10-CM | POA: Diagnosis not present

## 2023-04-27 DIAGNOSIS — M25552 Pain in left hip: Secondary | ICD-10-CM | POA: Diagnosis not present

## 2023-04-27 NOTE — Patient Instructions (Addendum)
 Thank you for coming to the office today.  Recommend Alpha Lipoic Acid 600mg  supplement up to max of 3 times per day nerve health supplement. - Helps nerve endings heal and reduce nerve damage  Circulation appears better  Okay to take Magnesium supplement dosing and Carbidopa  We can order the Oxycodone pain medicine on demand if needed within next 3 months if you want to contact our office first  Please schedule a Follow-up Appointment to: Return if symptoms worsen or fail to improve.  If you have any other questions or concerns, please feel free to call the office or send a message through MyChart. You may also schedule an earlier appointment if necessary.  Additionally, you may be receiving a survey about your experience at our office within a few days to 1 week by e-mail or mail. We value your feedback.  Saralyn Pilar, DO [facility], [place]

## 2023-04-27 NOTE — Progress Notes (Signed)
 Subjective:    Patient ID: Joseph Arroyo, male    DOB: [birth year], 83 y.o.   MRN: [id number]  Joseph Arroyo is a 83 y.o. male presenting on 04/27/2023 for Tremors   HPI  Discussed the use of AI scribe software for clinical note transcription with the patient, who gave verbal consent to proceed.  History of Present Illness   [name] is an 83 year old male with Parkinson's disease who presents for a medication review and foot circulation check.  He has Parkinson's disease and has been experiencing increased fatigue since a recent adjustment in his carbidopa dosage. The dosage was increased from two and a half tablets three times a day to three tablets three times a day for the immediate release, and from one tablet in the morning and two at night to three at night for the extended release. He feels more tired, particularly in the last few days, and wonders if this is related to the medication change. He experiences tremors, which vary in severity day by day, and is aware that Parkinson's disease itself can contribute to mood changes. He is currently seeing a chiropractor to help with posture and pain management.  He is on a regular regimen of Tylenol three times a day and takes ibuprofen as needed for back pain, which can become severe at night. He also takes gabapentin, which helps with sleep, and occasionally uses oxycodone for pain management. He is cautious about taking oxycodone at bedtime due to its potential interaction with gabapentin. He inquires about the use of alpha lipoic acid for nerve health and magnesium for sleep and memory, confirming that there are no interactions with his current medications. He is aware that dietary protein can affect the absorption of carbidopa and tries to manage his intake accordingly.  Regarding his foot circulation, he has been using a memory foam wedge for elevation during the day, which seems to have helped with symptoms of his  feet feeling hot. His feet are not as hot as before, and he has not been using compression socks as they feel too tight.         01/27/2023    1:37 PM 10/13/2022   10:01 AM 08/04/2022    8:23 AM  Depression screen PHQ 2/9  Decreased Interest 0 0 0  Down, Depressed, Hopeless 0  0  PHQ - 2 Score 0 0 0       05/26/2022    4:13 PM 03/17/2021    3:49 PM 10/01/2020    1:28 PM  GAD 7 : Generalized Anxiety Score  Nervous, Anxious, on Edge 2 1 0  Control/stop worrying 1 0 0  Worry too much - different things 1 0 0  Trouble relaxing 1 0 0  Restless 0 0 0  Easily annoyed or irritable 1 0 0  Afraid - awful might happen 0 0 0  Total GAD 7 Score 6 1 0  Anxiety Difficulty Not difficult at all Not difficult at all Not difficult at all    Social History   Tobacco Use   Smoking status: Never   Smokeless tobacco: Never  Vaping Use   Vaping status: Never Used  Substance Use Topics   Alcohol use: No    Alcohol/week: 0.0 standard drinks of alcohol   Drug use: No    Review of Systems Per HPI unless specifically indicated above     Objective:    BP 110/70   Pulse 62   Ht  5\' 7"  (1.702 m)   Wt 125 lb (56.7 kg)   SpO2 97%   BMI 19.58 kg/m   Wt Readings from Last 3 Encounters:  04/27/23 125 lb (56.7 kg)  03/26/23 124 lb (56.2 kg)  01/27/23 121 lb (54.9 kg)    Physical Exam Vitals and nursing note reviewed.  Constitutional:      General: He is not in acute distress.    Appearance: He is well-developed. He is not diaphoretic.     Comments: Chronically ill 82 yr male  HENT:     Head: Normocephalic and atraumatic.  Eyes:     General:        Right eye: No discharge.        Left eye: No discharge.     Conjunctiva/sclera: Conjunctivae normal.  Neck:     Thyroid: No thyromegaly.  Cardiovascular:     Rate and Rhythm: Regular rhythm. Bradycardia present.     Pulses: Normal pulses.     Heart sounds: Normal heart sounds. No murmur heard. Pulmonary:     Effort: Pulmonary effort is  normal. No respiratory distress.     Breath sounds: Normal breath sounds. No wheezing or rales.  Musculoskeletal:     Cervical back: Normal range of motion and neck supple.     Right lower leg: No edema.     Left lower leg: No edema (varicose veins).     Comments: Seated, has manual walker Difficulty with back posture Able to stand with equipment assistance and ambulation is slower requires assistance to stand and ambulate. Some loss of muscle mass density  Lymphadenopathy:     Cervical: No cervical adenopathy.  Skin:    General: Skin is warm and dry.     Findings: No erythema or rash.     Comments: Discoloration of bilateral feet toes mostly L>R with some red vs darker discoloration, touch blanches easily, has intact distal pulses, some cool to touch at ends of toes.  Neurological:     Mental Status: He is alert and oriented to person, place, and time. Mental status is at baseline.     Motor: Weakness present.     Gait: Gait abnormal.     Comments: Tremors resting.  Psychiatric:        Behavior: Behavior normal.     Comments: Well groomed, good eye contact, normal speech and thoughts     Results for orders placed or performed in visit on 10/23/22  BASIC METABOLIC PANEL WITH GFR   Collection Time: 10/23/22  9:00 AM  Result Value Ref Range   Glucose, Bld 121 65 - 139 mg/dL   BUN 25 7 - 25 mg/dL   Creat 1.61 0.96 - 0.45 mg/dL   eGFR 92 > OR = 60 [id number]/[id number]/1.91Y7   BUN/Creatinine Ratio SEE NOTE: 6 - 22 (calc)   Sodium 140 135 - 146 mmol/L   Potassium 4.2 3.5 - 5.3 mmol/L   Chloride 101 98 - 110 mmol/L   CO2 32 20 - 32 mmol/L   Calcium 9.1 8.6 - 10.3 mg/dL  TSH   Collection Time: 10/23/22  9:00 AM  Result Value Ref Range   TSH 0.99 0.40 - 4.50 mIU/L  Lipid panel   Collection Time: 10/23/22  9:00 AM  Result Value Ref Range   Cholesterol 141 <200 mg/dL   HDL 66 > OR = 40 mg/dL   Triglycerides 53 <829 mg/dL   LDL Cholesterol (Calc) 62 mg/dL (calc)   Total CHOL/HDL Ratio 2.1  <  5.0 (calc)   Non-HDL Cholesterol (Calc) 75 <147 mg/dL (calc)  Hemoglobin W2N   Collection Time: 10/23/22  9:00 AM  Result Value Ref Range   Hgb A1c MFr Bld 5.2 <5.7 % of total Hgb   Mean Plasma Glucose 103 mg/dL   eAG (mmol/L) 5.7 mmol/L      Assessment & Plan:   Problem List Items Addressed This Visit     Chronic pain syndrome (Chronic)   Parkinson's disease with dyskinesia (HCC) - Primary (Chronic)   Other Visit Diagnoses       Venous stasis of both lower extremities         Other polyneuropathy             Parkinson's Disease Followed by Neurology on medication management Recent change, Increased fatigue and potential mood changes with recent increase in Carbidopa-Levodopa. Discussed potential side effects and the balance between managing symptoms and side effects. -Continue current regimen of Carbidopa-Levodopa. Discussed that this should be managed by Neurologist, but I looked into potential side effects for them. Consider reducing dose if fatigue persists or becomes problematic.  Chronic Back Pain Scoliosis Severe at times, particularly with certain postures. Currently managed with regular Tylenol, occasional Ibuprofen, and Oxycodone as needed. -Continue current pain management regimen. -Encourage maintenance of posture, activity, and strengthening exercises to support back health. - Agree to re order Oxycodone AS NEEDED pain when ready, he has plenty left, and not ready for new order request. Does not need apt to return for refill if within next 3 months  Circulation Concerns about circulation in lower extremities. Examination showed good capillary refill and palpable pulses. -Continue current management with elevation and body pillow positioning as tolerated.  Medication Management Discussed safe use of Oxycodone and Xanax, emphasizing the importance of spacing doses to avoid respiratory depression. -Advise waiting a minimum of 3 hours between doses, ideally 4-6  hours.  General Health Maintenance Agree to order refill of Oxycodone as requested. -Encourage continued use of supplements (Alpha Lipoic Acid and Magnesium) for nerve health and memory support, respectively.         No orders of the defined types were placed in this encounter.   No orders of the defined types were placed in this encounter.   Follow up plan: Return if symptoms worsen or fail to improve.   Saralyn Pilar, DO [facility] 04/27/2023, 11:05 AM

## 2023-04-29 ENCOUNTER — Ambulatory Visit: Payer: BC Managed Care – PPO

## 2023-04-29 DIAGNOSIS — M9903 Segmental and somatic dysfunction of lumbar region: Secondary | ICD-10-CM | POA: Diagnosis not present

## 2023-04-29 DIAGNOSIS — M5451 Vertebrogenic low back pain: Secondary | ICD-10-CM | POA: Diagnosis not present

## 2023-04-29 DIAGNOSIS — M5137 Other intervertebral disc degeneration, lumbosacral region with discogenic back pain only: Secondary | ICD-10-CM | POA: Diagnosis not present

## 2023-04-29 DIAGNOSIS — M7918 Myalgia, other site: Secondary | ICD-10-CM | POA: Diagnosis not present

## 2023-04-29 DIAGNOSIS — M25551 Pain in right hip: Secondary | ICD-10-CM | POA: Diagnosis not present

## 2023-04-29 DIAGNOSIS — M5136 Other intervertebral disc degeneration, lumbar region with discogenic back pain only: Secondary | ICD-10-CM | POA: Diagnosis not present

## 2023-04-29 DIAGNOSIS — M25552 Pain in left hip: Secondary | ICD-10-CM | POA: Diagnosis not present

## 2023-04-29 DIAGNOSIS — M9905 Segmental and somatic dysfunction of pelvic region: Secondary | ICD-10-CM | POA: Diagnosis not present

## 2023-04-29 DIAGNOSIS — M9904 Segmental and somatic dysfunction of sacral region: Secondary | ICD-10-CM | POA: Diagnosis not present

## 2023-05-04 ENCOUNTER — Ambulatory Visit: Payer: Medicare HMO

## 2023-05-06 ENCOUNTER — Ambulatory Visit: Payer: BC Managed Care – PPO

## 2023-05-11 ENCOUNTER — Ambulatory Visit: Payer: Medicare HMO | Admitting: Physical Therapy

## 2023-05-13 ENCOUNTER — Ambulatory Visit: Payer: BC Managed Care – PPO

## 2023-05-20 ENCOUNTER — Other Ambulatory Visit: Payer: Self-pay

## 2023-05-20 ENCOUNTER — Telehealth: Payer: Self-pay

## 2023-05-20 NOTE — Patient Outreach (Signed)
  Care Management   Outreach Note  05/20/2023 Name: Joseph Arroyo MRN: [id number] DOB: 04/16/1940  An unsuccessful outreach attempt was made today for a scheduled Care Management visit.   Follow Up Plan:  A HIPAA compliant phone message was left for the patient providing contact information and requesting a return call.     Juanell Fairly [facility] RN Care Manager Direct Dial: [phone]  Fax: [phone] Website: [email or web address]

## 2023-05-21 ENCOUNTER — Telehealth: Payer: Self-pay

## 2023-05-21 NOTE — Patient Outreach (Signed)
  Care Management   Outreach Note  05/21/2023 Name: Joseph Arroyo MRN: [id number] DOB: 1940/08/12  Message received from [name]. Will follow up to reschedule outreach with nurse case manager.    Juanell Fairly [facility] Population [facility] RN Care Manager Direct Dial: [phone]  Fax: [phone] Website: [email or web address]

## 2023-07-28 ENCOUNTER — Encounter: Payer: Self-pay | Admitting: Psychiatry

## 2023-07-28 ENCOUNTER — Ambulatory Visit: Payer: No Typology Code available for payment source | Admitting: Psychiatry

## 2023-07-28 DIAGNOSIS — F411 Generalized anxiety disorder: Secondary | ICD-10-CM

## 2023-07-28 DIAGNOSIS — G894 Chronic pain syndrome: Secondary | ICD-10-CM

## 2023-07-28 DIAGNOSIS — F3341 Major depressive disorder, recurrent, in partial remission: Secondary | ICD-10-CM

## 2023-07-28 MED ORDER — ALPRAZOLAM 0.25 MG PO TABS
0.2500 mg | ORAL_TABLET | Freq: Three times a day (TID) | ORAL | 0 refills | Status: AC | PRN
Start: 2023-07-28 — End: ?

## 2023-07-28 MED ORDER — PAROXETINE HCL 20 MG PO TABS: ORAL_TABLET | ORAL | 1 refills | Status: DC

## 2023-07-28 MED ORDER — PAROXETINE HCL 40 MG PO TABS
ORAL_TABLET | ORAL | 1 refills | Status: DC
Start: 2023-07-28 — End: 2024-01-11

## 2023-07-28 MED ORDER — L-METHYLFOLATE 15 MG PO TABS: 15.0000 mg | ORAL_TABLET | Freq: Every day | ORAL | 3 refills | Status: DC

## 2023-07-28 MED ORDER — GABAPENTIN 100 MG PO CAPS
ORAL_CAPSULE | ORAL | 1 refills | Status: DC
Start: 2023-07-28 — End: 2024-01-11

## 2023-07-28 NOTE — Progress Notes (Signed)
 Joseph Arroyo [id number] 1940-10-18 82 y.o.  Virtual Visit via Telephone Note  I connected with pt by telephone and verified that I am speaking with the correct person using two identifiers.   I discussed the limitations, risks, security and privacy concerns of performing an evaluation and management service by telephone and the availability of in person appointments. I also discussed with the patient that there may be a patient responsible charge related to this service. The patient expressed understanding and agreed to proceed.  I discussed the assessment and treatment plan with the patient. The patient was provided an opportunity to ask questions and all were answered. The patient agreed with the plan and demonstrated an understanding of the instructions.   The patient was advised to call back or seek an in-person evaluation if the symptoms worsen or if the condition fails to improve as anticipated.  I provided 30 minutes of non-face-to-face time during this encounter. The patient was located at home and the provider was located office. Session 1000-1030  Subjective:   Patient ID:  Joseph Arroyo is a 83 y.o. (DOB 03/29/1940) male.  Chief Complaint:  Chief Complaint  Patient presents with   Follow-up   Depression   Anxiety    Joseph Arroyo ( lor-on) presents to the office today for follow-up of major depression and generalized anxiety disorder.    He was seen December, 2020 & June 2021.  No meds were changed.  Continued paroxetine  60 mg daily plus gabapentin  100 mg twice daily and Mirapex  0.125 mg 3 times daily.   08/2019 appt without med changes noted: Tolerates the meds well.  Wife thinks he's doing very well and he agrees.   No complaints.  2 D's.   Still exercising and has fatigue but can function and enjoys activity. Restarted men's Bible study.  He feels positive and hopeful and useful.  At some point would like to be free of medication.  Does not feel  guilty about the meds.    03/19/2020 appt noted:  Seen with wife Joseph Arroyo August and took Ivermectin and both had it mild. As far mood concerned he's doing well.  Wife agrees.  Not markedly depressed or anxious.  Feels the suffering of others.  Joseph Arroyo in hospital in [place].  Friend with Covid also. No episodes. Tolerating meds.  No Xanax  used.  Attend [employer] in [place]. Usually sleep well but back pain can interfere with sleep. TX for PD and a bit slower per wife. Plan: Try increasing gabapentin  to 100 mg AM & 200 mg PM for back pain.   Continue paroxetine  60 mg daily and Deplin 15 mg daily  10/03/2020 appointment with the following noted: Seen with wife, Joseph Arroyo and on increased gabapentin  at PM to 200 mg for back pain and sleeps better.   Not depressed.  Anxiety is manageable.   Seeing chiropracter who he likes Joseph Arroyo in [place]. No SE noted with meds.  Perspire heavily. No panic attacks.   Sleep variable and is usually OK. Still leads men's Bible Study. Uses [employer] Plan: No med change  03/25/2021 appointment with the following noted: with wife Joseph Arroyo but came on quickly. Shaking with anxiety and been going on for 3-4 weeks. Rare use of Xanax . A lot of physical px with constant pain with back. Pending workup.  Fell 3 weeks ago. T pain clinic now.  Everything seemed to pile up. Insomnia with mind racing with worry.  He can't  tolerate negativity.   Rare panic. Consistent with paroxetine  60 mg daily. Consistent with gabapentin  100 AM and 200 PM Plan: For persistent symptoms of anxiety we will start Arroyo label trial of clonidine .  Cautioned about side effects.  05/13/2021 appointment with the following noted: wife Joseph Arroyo on call Taking clonidine  0.1 mg tablet 1/2 tablets twice daily. Can't tell if there has been benefit from this.   Still taking Xanax  but usually about one daily. Couldn't get here DT pain.  Been to pain clinic but no meds yet.   Problems with insurance company giving approval for the tests.  At times pain is excruciating.   BP has been OK.   Being cautious about getting up and protecting from dizziness. Tremors will wax and wane and worse with stress.   Fighting over the idea God does not want him to be like this and feels there is something wrong with him.   No SI  "that's not an option".   Plan: Continue paroxetine  60 mg daily DT PD hesitate to use atypicals for TR anxiety. Consider increasing gabapentin  for anxiety Arroyo label.  Yes increase to 200 mg BID Ok Xanax   Clonidine  0.1 mg tablets 1/2 tablet twice daily    07/03/2021 appointment with the following noted: seen with Joseph Arroyo on call Using Xanax  prn 1-2 daily. Increased gabapentin  100 mg AM and noon and 200 mg PM; hard to tell if it helped any but it might have.  No SE noted. Feels fine today and thinks there's improvement.  Believe the Joseph Arroyo is bringing him through this. Epidural recently.  Some pain relief. Per wife has had some anxious moments.   Tremors vary.   Some PT, balance a little worse since Xmas.   No low BP. When sleep is fine but 2-3 hours at a time and nocturia. He thinks depression is a lot better, today I feel good.  Needs to get out socially.  He's a people person.  She thinks today is best day since Jan.   Plan: Consider increasing gabapentin  for anxiety Arroyo label.  Yes increase to 200 mg BID  09/09/2021 appointment with following noted: with wife Joseph Arroyo Doing ok thank you. A lot better with depression and anxiety. Gabapentin  100 mg AM and noon and 200 HS. Gets sleepy in Am after breakfast with Sinemet .  Takes a nap. Sleep pretty well. Disc scamming but they have been careful. Wife notices anxiety and tremors reenforce each other.  Only one Xanax  daily.  Wants to go out daily. Not attending main church but can go to house church and is getting out more. No SE except sleepiness. 3 back pain procedures with some benefit. Started speech  therapy and OT and he likes to be active. Plan: no med changes Continur paroxetine  60 mg daily gabapentin  100 mg capsules 1 every morning and noon and 200 mg nightly,  01/12/22 appt noted:  (Loran) Continues alprazolam  0.25 mg 3 times daily as needed anxiety (taking 1 daily), clonidine  0.1 mg tablets one half twice daily, gabapentin  100 mg every morning and noon and 200 mg nightly, L-methylfolate, paroxetine  20 mg every morning and 40 mg nightly, pramipexole  0.125 mg nightly. Increased carbi-levo to 2 and 1/2 tab TID.   Vivid dreams.  Last night a disturbing one.  A couple of times in past remotely acted out the dream.   Feeling tired with the meds.  Pain meds also make him tired and PD too.  Continues to believe God will heal him completely.  Getting facet blocks at the pain doc.  Continues shots. Depression is minimal but more anxiety than depression.  Will sometimes Xanax  for tremor.  Not alsways used daily.  Finished speech therapy but still doing PT.  Is better when he goes out somewhere.  Family coming soon,  Sleep is otherwise OK.   Tendency to constipation or diarrhea.   Asks about natural meds to help his medical problems if possible.  He'd like some changes .   Church built him a ramp for his house. Plan: No med changes   05/13/2022 appointment noted:  with Joseph Arroyo, reviewed med list with her Not taking pramipexole  hs but is taking gabapentin . More difficulty speaking than he has been in the past.   Holding my own.  Struggling quite a bit.  More anxiety provoked by tremor or bad dreams.  Latter caused bad dreams.  Still some.  Not acting out dreams like before.  Bad dreams about 2 times per week.   Usually sleep 2 hours at time with nocturia.   PD sx are worse per wife.  Harder to walk and do things.   No VH with meds for PD. Anxiety remains pretty high and needs the Xanax .   ? Tiredness with meds.   Plan: Trial wean  and if anxiety is worse then resume Clonidine  0.1 mg tablets 1/2  tablet twice daily    06/30/22 appt noted: with Joseph Arroyo Radio procedure for pain recently and too early to tell how it will work.  Never had it before.   Taking alprazolam  prn Taking gabapentin  100 mg prn.  Clonidine  0.05 mg HS. Paroxetine  60 mg daily. BP is better with less.   Anxiety worse middday when tremors are worse. Sleep 2 h then nocturia and then 1/2 hour to get back to sleep.  May take 1/2 hour to get back to bed DT PD slowness. Unsure how much sleep he's getting.  Not sure if reducing clonidine  increased the anxiety or not. No panic.  No crying spells.  Can't cry.    11/03/22 appt noted: with Joseph Arroyo clonidine .  He didn't notice any difference Arroyo of it. Had several procedures and trying to limit meds.  Still having pain issues.  Steroid injx have steroid which increases anxiety.  Not as high now but still there.  Xanax  0.25 mg prn (about 1 daily). Gabapentin  makes him drowsy in the AM Sleep 2 h then nocturia and then 1/2 hour to get back to sleep.  May take 1/2 hour to get back to bed DT PD slowness. Unsure how much sleep he's getting.  Doses a good bit in the day too. Steroids tend to affect mood also.   No other problems with meds.   Makes a point of getting out of the house.  Walks at grocery store and doing some PT.  Vertigo PT [place].  03/10/23 appt noted: with Joseph Arroyo virtual Psych meds: paroxetine  60, gabapentin  100 mg BID and 200 mg HS for anxiety and pain, Xanax  0.25 mg BID prn Per wife doing pretty good with vertigo with a little at times.  PT helped.   "Stable" per pt.  At times feels anxious and tries to deal with it in prayer.  At times of day will be more anxious even if hungry.  Try to avoid triggers for anxiety.   No full panic lately.  Will use Xanax  prn infrequently.   Some awakening and will pray and recite scripture.   No severe dep.  Gets  down over his health at times. No specific concerns with meds.   SE some sleepiness in the AM with Sinemet .   Thankful D  [facility] returned to [facility] gave them a hospital bed.   Plan: No med changes  07/28/23 appt noted: Psych meds: paroxetine  60, gabapentin  100 mg BID and 200 mg HS for anxiety and pain, Xanax  0.25 mg BID prn rare, not often oxycodone  5 No SE concerns with psych meds. Ongoing weakness and gait px.  Increased Sinemet .  Causes some sleepiness .   Disc timing of L-methylfolate.   B in law died in June 17, 2023.  Affected pt a little. Rare near panic but periods of anxiety and rarely takes BZ.   Appetite is fine.  Had lost wt before but stabilized.  Wt down to 119# and up to 125#. Sleep is good.  Energy affected by PD>   PD tx helps some.   Tries not to nap.   Has some men who come to support him weekly.  Bringing Church to him.   D helps him exercise to try to stay mobile.      Was 12 years free of medication until this last relapse and yet it took a long time to get relief again.  Wife, Joseph Arroyo, said overall he's been doing well.  No prn BZ in a couple of years.  Past Psychiatric Medication Trials: Under our care since 2013. Paroxetine  60, duloxetine, fluoxetine, Luvox, venlafaxine, Trintellix, ,  mirtazapine, doxepin,  Gabapentin  200 BID Deplin,  clonazepam, lorazepam, alprazolam   buspirone,   Clonidine  0.05 BID for anxiety lamotrigine,   Abilify, Seroquel, Zyprexa,  risperidone, Saphris,  lithium with some benefit,  Pramipexole    Review of Systems:  Review of Systems  Constitutional:  Positive for fatigue.  HENT:  Positive for voice change.   Cardiovascular:  Negative for palpitations.  Gastrointestinal:  Positive for diarrhea.  Musculoskeletal:  Positive for back pain and gait problem.  Neurological:  Positive for tremors and weakness. Negative for dizziness.  Psychiatric/Behavioral:  Negative for dysphoric mood. The patient is nervous/anxious.     Medications: I have reviewed the patient's current medications.  Current Outpatient Medications  Medication Sig Dispense Refill    acetaminophen  (TYLENOL ) 500 MG tablet Take 1,000 mg by mouth every 8 (eight) hours as needed.     B Complex Vitamins (VITAMIN B COMPLEX PO) Take 1 tablet by mouth daily.     baclofen  (LIORESAL ) 10 MG tablet Take 0.5-1 tablets (5-10 mg total) by mouth 3 (three) times daily as needed for muscle spasms. 90 each 1   Capsicum-Garlic 200-300 MG CAPS Take 200-300 mg by mouth.     carbidopa -levodopa  (SINEMET  IR) 25-100 MG tablet Take 2.5 tablets by mouth 3 (three) times daily.     ciclopirox (PENLAC) 8 % solution Apply topically at bedtime.     clobetasol (TEMOVATE) 0.05 % external solution Apply 1 Application topically daily.     HAWTHORNE BERRY PO Take by mouth daily.     Lutein 10 MG TABS Take 1 tablet by mouth 3 (three) times daily. 1 daily     Naftifine HCl 2 % GEL Apply 1 Application topically daily.     oxyCODONE  (OXY IR/ROXICODONE ) 5 MG immediate release tablet Take 1 tablet (5 mg total) by mouth 3 (three) times daily as needed for severe pain (pain score 7-10) or moderate pain (pain score 4-6). 45 tablet 0   Saw Palmetto, Serenoa repens, 1000 MG CAPS Take 2 capsules by mouth  daily.     ALPRAZolam  (XANAX ) 0.25 MG tablet Take 1 tablet (0.25 mg total) by mouth 3 (three) times daily as needed for anxiety. 90 tablet 0   gabapentin  (NEURONTIN ) 100 MG capsule 1 in AM and noon and 2 at night 360 capsule 1   L-Methylfolate 15 MG TABS Take 1 tablet (15 mg total) by mouth daily. 90 tablet 3   PARoxetine  (PAXIL ) 20 MG tablet TAKE 1 TABLET BY MOUTH EVERY DAY (WITH THE 40 MG TABLET FOR 60 MG TOTAL) 90 tablet 1   PARoxetine  (PAXIL ) 40 MG tablet TAKE 1 TABLET BY MOUTH EVERY DAY (WITH THE 20 MG TABLET FOR 60 MG TOTAL) 90 tablet 1   No current facility-administered medications for this visit.    Medication Side Effects: Fatigue and Other: from Mirapex  esp in AM  Allergies:  Allergies  Allergen Reactions   Prednisone     Hyperactivity    Wheat Diarrhea    Past Medical History:  Diagnosis Date    Allergic rhinitis due to pollen 11/21/2007   Brachial neuritis or radiculitis NOS    Cervicalgia    Concussion    age 65 - s/p accident   Costal chondritis    Depression    Essential and other specified forms of tremor    GERD (gastroesophageal reflux disease)    in past   Lumbago    Occlusion and stenosis of carotid artery without mention of cerebral infarction    Seizures (HCC)    age 83 - after concussion    Family History  Problem Relation Age of Onset   Heart failure Mother        enlarged heart    Asthma Father     Social History   Socioeconomic History   Marital status: Married    Spouse name: Joseph Arroyo Santa   Number of children: 2   Years of education: 12   Highest education level: Master's degree (e.g., MA, MS, MEng, MEd, MSW, MBA)  Occupational History   Occupation: Retired Film/video editor (English)    Employer: RETIRED  Tobacco Use   Smoking status: Never   Smokeless tobacco: Never  Vaping Use   Vaping status: Never Used  Substance and Sexual Activity   Alcohol use: No    Alcohol/week: 0.0 standard drinks of alcohol   Drug use: No   Sexual activity: Not Currently  Other Topics Concern   Not on file  Social History Narrative   Married to [place], has 2 children, has grandchildren   Right handed   Master's plus   He is born in [place], heritage is [place] (Father's side), [place] (Mother's side)      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    Social Drivers of Corporate investment banker Strain: Low Risk  (06/03/2022)   Overall Financial Resource Strain (CARDIA)    Difficulty of Paying Living Expenses: Not hard at all  Food Insecurity: No Food Insecurity (06/08/2022)   Hunger Vital Sign    Worried About Running Out of Food in the Last Year: Never true    Ran Out of Food in the Last Year: Never true  Transportation Needs: No Transportation Needs (06/08/2022)   PRAPARE - Administrator, Civil Service (Medical): No    Lack of Transportation  (Non-Medical): No  Physical Activity: Insufficiently Active (06/03/2022)   Exercise Vital Sign    Days of Exercise per Week: 3 days    Minutes of Exercise per Session: 30 min  Stress: No  Stress Concern Present (06/03/2022)   [employer] of Occupational Health - Occupational Stress Questionnaire    Feeling of Stress : Only a little  Social Connections: Socially Integrated (06/03/2022)   Social Connection and Isolation Panel [NHANES]    Frequency of Communication with Friends and Family: More than three times a week    Frequency of Social Gatherings with Friends and Family: Twice a week    Attends Religious Services: More than 4 times per year    Active Member of [employer] or Organizations: Yes    Attends Banker Meetings: 1 to 4 times per year    Marital Status: Married  Catering manager Violence: Not At Risk (06/03/2022)   Humiliation, Afraid, Rape, and Kick questionnaire    Fear of Current or Ex-Partner: No    Emotionally Abused: No    Physically Abused: No    Sexually Abused: No    Past Medical History, Surgical history, Social history, and Family history were reviewed and updated as appropriate.   Please see review of systems for further details on the patient's review from today.   Objective:   Physical Exam:  There were no vitals taken for this visit.  Physical Exam Neurological:     Mental Status: He is alert and oriented to person, place, and time.     Cranial Nerves: No dysarthria.  Psychiatric:        Attention and Perception: Attention and perception normal.        Mood and Affect: Mood is anxious. Mood is not depressed.        Behavior: Behavior is cooperative.        Thought Content: Thought content normal. Thought content is not paranoid or delusional. Thought content does not include homicidal or suicidal ideation. Thought content does not include suicidal plan.        Cognition and Memory: Cognition and memory normal.        Judgment: Judgment normal.      Comments: Insight intact Voice change with PD with reduced volume.   Still residual sx     Lab Review:     Component Value Date/Time   NA 140 10/23/2022 0900   NA 143 07/10/2015 0821   K 4.2 10/23/2022 0900   CL 101 10/23/2022 0900   CO2 32 10/23/2022 0900   GLUCOSE 121 10/23/2022 0900   BUN 25 10/23/2022 0900   BUN 13 07/10/2015 0821   CREATININE 0.73 10/23/2022 0900   CALCIUM 9.1 10/23/2022 0900   PROT 6.4 10/02/2022 1433   PROT 6.8 07/10/2015 0821   ALBUMIN 4.1 03/24/2021 1351   ALBUMIN 4.2 07/10/2015 0821   AST 17 10/02/2022 1433   ALT 7 (L) 10/02/2022 1433   ALKPHOS 51 03/24/2021 1351   BILITOT 0.5 10/02/2022 1433   BILITOT 0.5 07/10/2015 0821   GFRNONAA >60 03/24/2021 1351   GFRNONAA 86 09/07/2019 0941   GFRAA 100 09/07/2019 0941       Component Value Date/Time   WBC 6.1 10/02/2022 1433   RBC 4.42 10/02/2022 1433   HGB 14.2 10/02/2022 1433   HGB 15.3 10/23/2014 1237   HCT 41.3 10/02/2022 1433   HCT 45.1 10/23/2014 1237   PLT 207 10/02/2022 1433   PLT 150 10/23/2014 1237   MCV 93.4 10/02/2022 1433   MCV 88 10/23/2014 1237   MCH 32.1 10/02/2022 1433   MCHC 34.4 10/02/2022 1433   RDW 11.7 10/02/2022 1433   RDW 13.3 10/23/2014 1237   LYMPHSABS 1,305 10/02/2022  1433   LYMPHSABS 1.7 10/23/2014 1237   MONOABS 357 07/24/2016 1050   EOSABS 201 10/02/2022 1433   EOSABS 0.3 10/23/2014 1237   BASOSABS 61 10/02/2022 1433   BASOSABS 0.1 10/23/2014 1237    No results found for: "POCLITH", "LITHIUM"   No results found for: "PHENYTOIN", "PHENOBARB", "VALPROATE", "CBMZ"   .res Assessment: Plan:    Donovan was seen today for follow-up, depression and anxiety.  Diagnoses and all orders for this visit:  Recurrent major depression in partial remission (HCC) -     L-Methylfolate 15 MG TABS; Take 1 tablet (15 mg total) by mouth daily. -     PARoxetine  (PAXIL ) 20 MG tablet; TAKE 1 TABLET BY MOUTH EVERY DAY (WITH THE 40 MG TABLET FOR 60 MG TOTAL) -      PARoxetine  (PAXIL ) 40 MG tablet; TAKE 1 TABLET BY MOUTH EVERY DAY (WITH THE 20 MG TABLET FOR 60 MG TOTAL) -     ALPRAZolam  (XANAX ) 0.25 MG tablet; Take 1 tablet (0.25 mg total) by mouth 3 (three) times daily as needed for anxiety.  GAD (generalized anxiety disorder) -     L-Methylfolate 15 MG TABS; Take 1 tablet (15 mg total) by mouth daily. -     gabapentin  (NEURONTIN ) 100 MG capsule; 1 in AM and noon and 2 at night -     PARoxetine  (PAXIL ) 20 MG tablet; TAKE 1 TABLET BY MOUTH EVERY DAY (WITH THE 40 MG TABLET FOR 60 MG TOTAL) -     PARoxetine  (PAXIL ) 40 MG tablet; TAKE 1 TABLET BY MOUTH EVERY DAY (WITH THE 20 MG TABLET FOR 60 MG TOTAL) -     ALPRAZolam  (XANAX ) 0.25 MG tablet; Take 1 tablet (0.25 mg total) by mouth 3 (three) times daily as needed for anxiety.  Chronic pain syndrome   History of extremely severe TR depression and anxiety with multiple med failures.  Doing relatively well for 2-3 years.    But residual dep and anxiety sx contributed to by pain and advancing Parkinson's disease.  He has been consistent with the paroxetine  but would like for God to heal him so he can stop meds.    Still works out at gym.  Disc pricing L-methylfolate with [facility]. Continue paroxetine  60 mg daily Hesitate to increase paroxetine  right now   Parkinson's is better with meds. But not great. DT PD hesitate to use atypicals for TR anxiety.    Would not benefit from nortriptyline.    Continue gabapentin  for anxiety Arroyo label and pain 200 mg BID. Option reduce for energy  Ok Xanax  0.25 mg TID prn.  He's not using much, usually only 1 daily.   We discussed the short-term risks associated with benzodiazepines including sedation and increased fall risk among others.  Discussed long-term side effect risk including dependence, potential withdrawal symptoms, and the potential eventual dose-related risk of dementia.  But recent studies from 2020 dispute this association between benzodiazepines and dementia  risk. Newer studies in 2020 do not support an association with dementia.  Counseling 20 min on dealing with health problems and faith.    Tolerating and benefiting from meds.    Answered questions about Vitamin D .  He's taking 4000 units  No med changes  FU 6 mos  Nori Beat, MD, DFAPA   Please see After Visit Summary for patient specific instructions.  No future appointments.   No orders of the defined types were placed in this encounter.    -------------------------------

## 2023-08-17 ENCOUNTER — Telehealth: Payer: Self-pay | Admitting: Psychiatry

## 2023-08-17 DIAGNOSIS — F411 Generalized anxiety disorder: Secondary | ICD-10-CM

## 2023-08-17 DIAGNOSIS — F3341 Major depressive disorder, recurrent, in partial remission: Secondary | ICD-10-CM

## 2023-08-17 MED ORDER — L-METHYLFOLATE 15 MG PO TABS: 15.0000 mg | ORAL_TABLET | Freq: Every day | ORAL | 3 refills | Status: DC

## 2023-08-17 NOTE — Telephone Encounter (Signed)
 Reprinted scripts and put in mail.

## 2023-08-17 NOTE — Telephone Encounter (Signed)
 Wife called asking for the L-methyfolate 15 mg needs to printed and mailed to them. Dr. Toi Foster said that he would do that but they have not received it

## 2023-10-22 ENCOUNTER — Telehealth: Payer: Self-pay | Admitting: Psychiatry

## 2023-10-22 DIAGNOSIS — F411 Generalized anxiety disorder: Secondary | ICD-10-CM

## 2023-10-22 DIAGNOSIS — F3341 Major depressive disorder, recurrent, in partial remission: Secondary | ICD-10-CM

## 2023-10-22 MED ORDER — L-METHYLFOLATE 15 MG PO TABS: 15.0000 mg | ORAL_TABLET | Freq: Every day | ORAL | 3 refills | Status: AC

## 2023-10-22 NOTE — Telephone Encounter (Signed)
 Levorn called and said that she never received  l- Methylfolate 15 mg script that was mailed. So they would like it to be sent escribed to the [facility] in [place]

## 2023-10-22 NOTE — Telephone Encounter (Signed)
 Printed script has been sent twice. Sent RF to the requested pharmacy.

## 2023-11-02 ENCOUNTER — Encounter: Payer: Self-pay | Admitting: Family Medicine

## 2023-11-02 ENCOUNTER — Ambulatory Visit
Admission: RE | Admit: 2023-11-02 | Discharge: 2023-11-02 | Disposition: A | Discharge status: Home / Self Care | Source: Ambulatory Visit | Attending: Family Medicine | Admitting: Family Medicine

## 2023-11-02 ENCOUNTER — Ambulatory Visit (INDEPENDENT_AMBULATORY_CARE_PROVIDER_SITE_OTHER): Admitting: Family Medicine

## 2023-11-02 ENCOUNTER — Ambulatory Visit: Payer: Self-pay

## 2023-11-02 VITALS — BP 124/80 | HR 66 | Temp 98.3°F | Ht 67.0 in

## 2023-11-02 DIAGNOSIS — G8929 Other chronic pain: Secondary | ICD-10-CM

## 2023-11-02 DIAGNOSIS — G20B2 Parkinson's disease with dyskinesia, with fluctuations: Secondary | ICD-10-CM | POA: Diagnosis not present

## 2023-11-02 DIAGNOSIS — R1011 Right upper quadrant pain: Secondary | ICD-10-CM | POA: Insufficient documentation

## 2023-11-02 DIAGNOSIS — I739 Peripheral vascular disease, unspecified: Secondary | ICD-10-CM

## 2023-11-02 DIAGNOSIS — R109 Unspecified abdominal pain: Secondary | ICD-10-CM | POA: Diagnosis not present

## 2023-11-02 DIAGNOSIS — N400 Enlarged prostate without lower urinary tract symptoms: Secondary | ICD-10-CM

## 2023-11-02 DIAGNOSIS — Z87442 Personal history of urinary calculi: Secondary | ICD-10-CM

## 2023-11-02 DIAGNOSIS — N1330 Unspecified hydronephrosis: Secondary | ICD-10-CM

## 2023-11-02 DIAGNOSIS — M545 Low back pain, unspecified: Secondary | ICD-10-CM

## 2023-11-02 DIAGNOSIS — N319 Neuromuscular dysfunction of bladder, unspecified: Secondary | ICD-10-CM

## 2023-11-02 DIAGNOSIS — M47817 Spondylosis without myelopathy or radiculopathy, lumbosacral region: Secondary | ICD-10-CM

## 2023-11-02 MED ORDER — IOHEXOL 300 MG/ML  SOLN
80.0000 mL | Freq: Once | INTRAMUSCULAR | Status: AC | PRN
  Administered 2023-11-02: 80 mL via INTRAVENOUS

## 2023-11-02 NOTE — Telephone Encounter (Signed)
 FYI Only or Action Required?: FYI only for provider.  Patient was last seen in primary care on 04/27/2023 by Edman Marsa PARAS, DO.  Called Nurse Triage reporting No chief complaint on file..  Symptoms began several days ago.  Interventions attempted: OTC medications: Tylenol , Motrin, Oxycodone .  Symptoms are: gradually worsening.  Triage Disposition: No disposition on file.  Patient/caregiver understands and will follow disposition?:             Copied from CRM [id number]. Topic: Clinical - Red Word Triage >> Nov 02, 2023  8:38 AM Pinkey ORN wrote: Red Word that prompted transfer to Nurse Triage: Severe Back Pain >> Nov 02, 2023  8:39 AM Pinkey ORN wrote: Patient states he's experiencing his severe traveling back pain, seems as though it's pulling and traveling.  Reason for Disposition  [1] Pain or burning with passing urine (urination) AND [2] flank (e.g., in side of back, below ribs and above hip)  Answer Assessment - Initial Assessment Questions 1. ONSET: When did the pain begin? (e.g., minutes, hours, days)     Saturday 2. LOCATION: Where does it hurt? (upper, mid or lower back)     Low back, radiates to flank 3. SEVERITY: How bad is the pain?  (e.g., Scale 1-10; mild, moderate, or severe)     10/10 at worst 4. PATTERN: Is the pain constant? (e.g., yes, no; constant, intermittent)      Intermittent 5. RADIATION: Does the pain shoot into your legs or somewhere else?     To right flank 6. CAUSE:  What do you think is causing the back pain?      Unknown 8. MEDICINES: What have you taken so far for the pain? (e.g., nothing, acetaminophen , NSAIDS)     Tylenol , Motrin 9. NEUROLOGIC SYMPTOMS: Do you have any weakness, numbness, or problems with bowel/bladder control?     None 10. OTHER SYMPTOMS: Do you have any other symptoms? (e.g., fever, abdomen pain, burning with urination, blood in urine) None  Protocols used: Back Pain-A-AH

## 2023-11-02 NOTE — Progress Notes (Signed)
 Subjective:    Patient ID: Joseph Arroyo, male    DOB: [birth year], 83 y.o.   MRN: [id number]  Joseph Arroyo is a 83 y.o. male presenting on 11/02/2023 for Medical Management of Chronic Issues  Patient presents for a same day appointment.   HPI  Discussed the use of AI scribe software for clinical note transcription with the patient, who gave verbal consent to proceed.  History of Present Illness   Joseph Arroyo is an 83 year old male with a history of scoliosis and kidney stones who presents with severe right-sided flank and abdominal pain. He is accompanied by wife, Levorn Bautch  Right flank and abdominal pain Chronic Low Back / Hip Pain/ Scoliosis Parkinsons, advanced - Severe pain in the right flank, low back, and right side since Sunday morning - Pain described as severe, unusual for early morning, initially rated as greater than 10/10 - Characterized as pulling and radiating from the low back into the abdomen, sometimes more concentrated anteriorly - Constant pain, worsened by movement, especially when leaning to the right - Pain intensified again on Monday around 5 PM - Currently excruciating and tingling, particularly in the back above the pelvis on the right side - Avoids lying down with feet elevated due to increased pulling sensation - History of kidney stones many years ago, unclear if current pain is similar  Analgesic use and response - On Sunday, took a total of 5 mg oxycodone in divided doses with Tylenol and ibuprofen, resulting in partial relief but pain did not drop below 4/10 - On Monday, required a full dose of oxycodone due to increased pain, which is not his usual practice - Currently taking Tylenol 1000 mg three times daily - Occasionally uses oxycodone, typically in half doses, but has taken full doses recently due to severity of pain - Uses ibuprofen as needed  Urinary Obstructive Symptoms - Slow urination and difficulty initiating  urine flow, particularly at night - History of enlarged prostate vs neurogenic bladder      11 /27/2024    1:37 PM 10/13/2022   10:01 AM 08/04/2022    8:23 AM  Depression screen PHQ 2/9  Decreased Interest 0 0 0  Down, Depressed, Hopeless 0  0  PHQ - 2 Score 0 0 0       05/26/2022    4:13 PM 03/17/2021    3:49 PM 10/01/2020    1:28 PM  GAD 7 : Generalized Anxiety Score  Nervous, Anxious, on Edge 2 1 0  Control/stop worrying 1 0 0  Worry too much - different things 1 0 0  Trouble relaxing 1 0 0  Restless 0 0 0  Easily annoyed or irritable 1 0 0  Afraid - awful might happen 0 0 0  Total GAD 7 Score 6 1 0  Anxiety Difficulty Not difficult at all Not difficult at all Not difficult at all    Social History   Tobacco Use   Smoking status: Never   Smokeless tobacco: Never  Vaping Use   Vaping status: Never Used  Substance Use Topics   Alcohol use: No    Alcohol/week: 0.0 standard drinks of alcohol   Drug use: No    Review of Systems Per HPI unless specifically indicated above     Objective:    BP 124/80 (BP Location: Right Arm, Patient Position: Sitting, Cuff Size: Normal)   Pulse 66   Temp 98.3 F (36.8 C) (Oral)   Ht 5' 7 (  1.702 m)   SpO2 95%   BMI 19.58 kg/m   Wt Readings from Last 3 Encounters:  04/27/23 125 lb (56.7 kg)  03/26/23 124 lb (56.2 kg)  01/27/23 121 lb (54.9 kg)    Physical Exam Vitals and nursing note reviewed.  Constitutional:      General: He is not in acute distress.    Appearance: He is well-developed. He is not diaphoretic.     Comments: Chronically ill 83 yr male  HENT:     Head: Normocephalic and atraumatic.  Eyes:     General:        Right eye: No discharge.        Left eye: No discharge.     Conjunctiva/sclera: Conjunctivae normal.  Neck:     Thyroid : No thyromegaly.  Cardiovascular:     Rate and Rhythm: Regular rhythm. Bradycardia present.     Pulses: Normal pulses.     Heart sounds: Normal heart sounds. No murmur  heard. Pulmonary:     Effort: Pulmonary effort is normal. No respiratory distress.     Breath sounds: Normal breath sounds. No wheezing or rales.  Musculoskeletal:     Cervical back: Normal range of motion and neck supple.     Right lower leg: No edema.     Left lower leg: No edema (varicose veins).     Comments: Seated, has manual walker Difficulty with back posture Able to stand with equipment assistance and ambulation is slower requires assistance to stand and ambulate. Some loss of muscle mass density  Lymphadenopathy:     Cervical: No cervical adenopathy.  Skin:    General: Skin is warm and dry.     Findings: No erythema or rash.  Neurological:     Mental Status: He is alert and oriented to person, place, and time. Mental status is at baseline.     Motor: Weakness present.     Gait: Gait abnormal.     Comments: Tremors resting.  Psychiatric:        Behavior: Behavior normal.     Comments: Well groomed, good eye contact, normal speech and thoughts     I have personally reviewed the radiology report from 11/02/23 on CT Abd Pelvis w contrast STAT.  CLINICAL DATA:  Abdominal pain and diarrhea.   EXAM: CT ABDOMEN AND PELVIS WITH CONTRAST   TECHNIQUE: Multidetector CT imaging of the abdomen and pelvis was performed using the standard protocol following bolus administration of intravenous contrast.   RADIATION DOSE REDUCTION: This exam was performed according to the departmental dose-optimization program which includes automated exposure control, adjustment of the mA and/or kV according to patient size and/or use of iterative reconstruction technique.   CONTRAST:  80mL OMNIPAQUE  IOHEXOL  300 MG/ML  SOLN   COMPARISON:  None Available.   FINDINGS: Lower chest: Substantial coronary atherosclerotic plaque.   Hepatobiliary: Abnormal intrahepatic biliary dilatation confined to the lateral segment left hepatic lobe raising concern for stricture or obstructive process. Occult  biliary or hepatic mass can sometimes present this way.   Nonspecific 5 mm hypodense lesion in the dome of the right hepatic lobe, probably a cyst or similar benign lesion.   Gallbladder unremarkable.   Pancreas: Unremarkable   Spleen: Unremarkable   Adrenals/Urinary Tract: Mild right hydronephrosis without significant hydroureter. Vascular calcifications are present in the anatomic pelvis but no distal ureteral calculus is observed. Adrenal glands unremarkable. Wall thickness in the urinary bladder appears normal.   Stomach/Bowel: Borderline prominence of stool in the  colon suggesting mild constipation. Suspected mobile cecum in the right upper quadrant. Normal appendix.   Vascular/Lymphatic: Atherosclerosis is present, including aortoiliac atherosclerotic disease.   Reproductive: Prostatomegaly.   Other: No supplemental non-categorized findings.   Musculoskeletal: Levoconvex lumbar scoliosis with rotary component. Grade 1 degenerative retrolisthesis at L2-3.   IMPRESSION: 1. Abnormal intrahepatic biliary dilatation confined to the lateral segment left hepatic lobe raising concern for stricture or obstructive process. Occult biliary or hepatic mass can sometimes present this way. Consider hepatic protocol MRI with and without contrast for further characterization. 2. Mild right hydronephrosis without significant hydroureter. No distal ureteral calculus is observed. 3. Borderline prominence of stool in the colon suggesting mild constipation. 4. Prostatomegaly. 5. Levoconvex lumbar scoliosis with rotary component. Grade 1 degenerative retrolisthesis at L2-3. 6. Substantial coronary atherosclerotic plaque. 7.  Aortic Atherosclerosis (ICD10-I70.0).     Electronically Signed   By: Ryan Salvage M.D.   On: 11/02/2023 14:23  Results for orders placed or performed in visit on 10/23/22  BASIC METABOLIC PANEL WITH GFR   Collection Time: 10/23/22  9:00 AM  Result  Value Ref Range   Glucose, Bld 121 65 - 139 mg/dL   BUN 25 7 - 25 mg/dL   Creat 9.26 9.29 - 8.77 mg/dL   eGFR 92 > OR = 60 fO/fpw/8.26f7   BUN/Creatinine Ratio SEE NOTE: 6 - 22 (calc)   Sodium 140 135 - 146 mmol/L   Potassium 4.2 3.5 - 5.3 mmol/L   Chloride 101 98 - 110 mmol/L   CO2 32 20 - 32 mmol/L   Calcium 9.1 8.6 - 10.3 mg/dL  TSH   Collection Time: 10/23/22  9:00 AM  Result Value Ref Range   TSH 0.99 0.40 - 4.50 mIU/L  Lipid panel   Collection Time: 10/23/22  9:00 AM  Result Value Ref Range   Cholesterol 141 <200 mg/dL   HDL 66 > OR = 40 mg/dL   Triglycerides 53 <849 mg/dL   LDL Cholesterol (Calc) 62 mg/dL (calc)   Total CHOL/HDL Ratio 2.1 <5.0 (calc)   Non-HDL Cholesterol (Calc) 75 <869 mg/dL (calc)  Hemoglobin J8r   Collection Time: 10/23/22  9:00 AM  Result Value Ref Range   Hgb A1c MFr Bld 5.2 <5.7 % of total Hgb   Mean Plasma Glucose 103 mg/dL   eAG (mmol/L) 5.7 mmol/L      Assessment & Plan:   Problem List Items Addressed This Visit     Lumbosacral facet arthropathy (Left: L3-4, L4-5, and L5-S1) (Chronic)   Relevant Orders   Comprehensive metabolic panel with GFR   PAD (peripheral artery disease) (HCC) (Chronic)   Parkinson's disease with dyskinesia (HCC) - Primary (Chronic)   Relevant Medications   selegiline (ELDEPRYL) 5 MG tablet   Other Relevant Orders   CBC with Differential/Platelet   TSH   Comprehensive metabolic panel with GFR   BPH without obstruction/lower urinary tract symptoms   Relevant Orders   PSA   Ambulatory referral to Urology   Other Visit Diagnoses       Right sided abdominal pain       Relevant Orders   CBC with Differential/Platelet   Comprehensive metabolic panel with GFR     Chronic left-sided low back pain without sciatica       Relevant Orders   CBC with Differential/Platelet   Comprehensive metabolic panel with GFR     History of nephrolithiasis       Relevant Orders   Urinalysis, Routine w reflex microscopic  Neurogenic bladder       Relevant Orders   Ambulatory referral to Urology     Right upper quadrant abdominal pain       Relevant Orders   CT ABDOMEN PELVIS W CONTRAST (Completed)     Hydronephrosis, right       Relevant Orders   Ambulatory referral to Urology      Acute Right-sided low back and pelvic pain Chronic pain with lumbar spine and hip degenerative disease / scoliosis and muscle strains with advanced parkinsons, positioning and movement is limited. Severe pain with movement, likely muscular strain. Low likelihood of kidney stones or appendicitis based on history, however he does have some BPH and obstructive urinary symptoms difficulty voiding at this time. Not having fevers or blood in urine. He has history of nephrolithiasis, pain pattern not consistent but cannot rule out  - Order STAT CT scan to evaluate for kidney stones and appendicitis / or other occult causes of R abdominal pain - Perform blood tests including chemistry and blood count. - Obtain urine sample for analysis. - Continue pain management with Tylenol  and oxycodone  as needed.  Urinary hesitancy and slow urinary stream Symptoms suggest prostate enlargement or bladder issues question if neurogenic bladder or other obstructive uropathy - Order PSA blood test. - Consider further evaluation for prostate enlargement or bladder dysfunction.   Reviewed return criteria if symptoms are severe and not improving as expected. Or if develop new symptoms.     11/02/23 420pm result review, called patient spoke with with Levorn Berthold on CT results - see above results - it shows mild R hydronephrosis without hydroureter, no identified nephrolithiasis - Additional finding was for abnormal intrahepatic biliary dilatation, questioning if there is a biliary obstruction, will need hepatic protocol MRI for further evaluation. I advised that this is important but may not be urgent given we are waiting on lab results and LFTs and  further evaluation, his symptoms don't necessarily match hepatic etiology at this time. But we can pursue GI referral or MRI in near future if symptoms remain unresolved  Urgent Referral to Urology [facility].  Discussed w/ our referral coordinator today. She has contacted [facility] and left message, and notes on referral. Should be scheduled directly w/ patient / family now.  Orders Placed This Encounter  Procedures   CT ABDOMEN PELVIS W CONTRAST    Do Not Hold    Standing Status:   Future    Number of Occurrences:   1    Expiration Date:   11/01/2024    If indicated for the ordered procedure, I authorize the administration of contrast media per Radiology protocol:   Yes    Does the patient have a contrast media/X-ray dye allergy?:   No    Preferred imaging location?:   [facility]    If indicated for the ordered procedure, I authorize the administration of oral contrast media per Radiology protocol:   Yes    Call Results- Best Contact Number?:   [phone]   CBC with Differential/Platelet   PSA   TSH   Comprehensive metabolic panel with GFR    Has the patient fasted?:   No   Urinalysis, Routine w reflex microscopic   Ambulatory referral to Urology    Referral Priority:   Urgent    Referral Type:   Consultation    Referral Reason:   Specialty Services Required    Requested Specialty:   Urology    Number of Visits Requested:   1  No orders of the defined types were placed in this encounter.   Follow up plan: Return if symptoms worsen or fail to improve.  Marsa Officer, DO [facility] 11/02/2023, 11:22 AM

## 2023-11-02 NOTE — Patient Instructions (Addendum)
 Thank you for coming to the office today.  Keep on current pain medicine, okay to take extra if need  Labs today ordered  Tylenol  1000 mg THREE TIMES A DAY as need  CT Abdomen Pelvis ordered, STAT  Urinalysis  Stay tuned for results  If severe or worsening, seek pain at hospital if need if worsening.  Please schedule a Follow-up Appointment to: Return if symptoms worsen or fail to improve.  If you have any other questions or concerns, please feel free to call the office or send a message through MyChart. You may also schedule an earlier appointment if necessary.  Additionally, you may be receiving a survey about your experience at our office within a few days to 1 week by e-mail or mail. We value your feedback.  Marsa Officer, DO [facility], [place]

## 2023-11-03 LAB — COMPREHENSIVE METABOLIC PANEL WITH GFR
AG Ratio: 2 (calc) (ref 1.0–2.5)
ALT: 11 U/L (ref 9–46)
AST: 18 U/L (ref 10–35)
Albumin: 4.3 g/dL (ref 3.6–5.1)
Alkaline phosphatase (APISO): 54 U/L (ref 35–144)
BUN/Creatinine Ratio: 43 (calc) — ABNORMAL HIGH (ref 6–22)
BUN: 26 mg/dL — ABNORMAL HIGH (ref 7–25)
CO2: 28 mmol/L (ref 20–32)
Calcium: 9.5 mg/dL (ref 8.6–10.3)
Chloride: 99 mmol/L (ref 98–110)
Creat: 0.6 mg/dL — ABNORMAL LOW (ref 0.70–1.22)
Globulin: 2.2 g/dL (ref 1.9–3.7)
Glucose, Bld: 97 mg/dL (ref 65–99)
Potassium: 4.2 mmol/L (ref 3.5–5.3)
Sodium: 136 mmol/L (ref 135–146)
Total Bilirubin: 0.6 mg/dL (ref 0.2–1.2)
Total Protein: 6.5 g/dL (ref 6.1–8.1)
eGFR: 97 mL/min/1.73m2 (ref >=60)

## 2023-11-03 LAB — PSA: PSA: 1.52 ng/mL (ref <=4.00)

## 2023-11-03 LAB — CBC WITH DIFFERENTIAL/PLATELET
Absolute Lymphocytes: 1030 {cells}/uL (ref 850–3900)
Absolute Monocytes: 488 {cells}/uL (ref 200–950)
Basophils Absolute: 59 {cells}/uL (ref 0–200)
Basophils Relative: 0.9 %
Eosinophils Absolute: 429 {cells}/uL (ref 15–500)
Eosinophils Relative: 6.5 %
HCT: 40.2 % (ref 38.5–50.0)
Hemoglobin: 13.5 g/dL (ref 13.2–17.1)
MCH: 31.8 pg (ref 27.0–33.0)
MCHC: 33.6 g/dL (ref 32.0–36.0)
MCV: 94.6 fL (ref 80.0–100.0)
MPV: 9.9 fL (ref 7.5–12.5)
Monocytes Relative: 7.4 %
Neutro Abs: 4594 {cells}/uL (ref 1500–7800)
Neutrophils Relative %: 69.6 %
Platelets: 201 Thousand/uL (ref 140–400)
RBC: 4.25 Million/uL (ref 4.20–5.80)
RDW: 11.9 % (ref 11.0–15.0)
Total Lymphocyte: 15.6 %
WBC: 6.6 Thousand/uL (ref 3.8–10.8)

## 2023-11-03 LAB — URINALYSIS, ROUTINE W REFLEX MICROSCOPIC
Bilirubin Urine: NEGATIVE
Glucose, UA: NEGATIVE
Hgb urine dipstick: NEGATIVE
Ketones, ur: NEGATIVE
Leukocytes,Ua: NEGATIVE
Nitrite: NEGATIVE
Protein, ur: NEGATIVE
Specific Gravity, Urine: 1.025 (ref 1.001–1.035)
pH: 5.5 (ref 5.0–8.0)

## 2023-11-03 LAB — TSH: TSH: 0.97 m[IU]/L (ref 0.40–4.50)

## 2023-11-03 NOTE — Progress Notes (Unsigned)
 11/11/23 7:03 AM   Joseph Arroyo May 07, 1940 [id number]  CC: Right flank RLQ pain   HPI: 83 year old male here for initial evaluation of right flank pain, RLQ pain, urinary incontinence, genital itching Accompanied by his wife, very pleasant gentleman  Acute / subacute Right flank pain, in the context of severe chronic back pain  Denies acute change in urinary symptoms, dysuria, GH Patient has urinary incontinence, wears depends, issues with keeping groin dry, parrticularly at night Ongoing genital pruritus, has tried topical antifungals with weak effect  On [place]  CT A/P with con (11/02/2023) -incidental mild right hydronephrosis, prostamegaly, mild colonic constipation  History of PAD, chronic back pain, lumbosacral radiculopathies, Parkinson's disease (on Sinemet )   PMH: Past Medical History:  Diagnosis Date   Allergic rhinitis due to pollen 11/21/2007   Brachial neuritis or radiculitis NOS    Cervicalgia    Concussion    age 43 - s/p accident   Costal chondritis    Depression    Essential and other specified forms of tremor    GERD (gastroesophageal reflux disease)    in past   Lumbago    Occlusion and stenosis of carotid artery without mention of cerebral infarction    Seizures [facility])    age 23 - after concussion    Surgical History: Past Surgical History:  Procedure Laterality Date   CATARACT EXTRACTION Right 07/18/14   CATARACT EXTRACTION W/PHACO Left 08/01/2014   Procedure: CATARACT EXTRACTION PHACO AND INTRAOCULAR LENS PLACEMENT (IOC);  Surgeon: Dene Etienne, MD;  Location: [facility];  Service: Ophthalmology;  Laterality: Left;  IVA TOPICAL   COLONOSCOPY  2008   OTHER SURGICAL HISTORY  2002   ear surgery   STAPEDES SURGERY Right 2002    Family History: Family History  Problem Relation Age of Onset   Heart failure Mother        enlarged heart    Asthma Father     Social History:  reports that he has never smoked. He has  never used smokeless tobacco. He reports that he does not drink alcohol and does not use drugs.      Physical Exam: BP 91/64   Pulse 60   Ht 5' 6 (1.676 m)   Wt 122 lb (55.3 kg)   BMI 19.69 kg/m    Constitutional:  Alert and oriented, No acute distress. Cardiovascular: No clubbing, cyanosis, or edema. Respiratory: Normal respiratory effort, no increased work of breathing. GI: Nondistended GU: very mild erythema around scrotum and penile shaft if any, I do not see a overt yeast appearing rash with clear demarcations. Likely urinary /moisture irritation of skin Skin: No rashes, bruises or suspicious lesions. Neurologic: Grossly intact, no focal deficits, moving all 4 extremities. Psychiatric: Normal mood and affect.  Laboratory Data: UA (11/02/23) - negative  Creat 11/02/23 0.60 Low    Component Ref Range & Units (hover) 8 d ago 2 yr ago 3 yr ago 4 yr ago 5 yr ago 7 yr ago  PSA 1.52 1.33 CM 1.73 CM 1.4 R, CM 1.5 R, CM 1.8 R,    Pertinent Imaging: I have personally viewed and interpreted the CT A/P with con (11/02/2023) -agree with read, mild right hydronephrosis versus extrarenal pelvis, right pelviectasis stops at the UPJ.  Otherwise morphologically normal bilateral kidneys.  Ureters nondilated.  Bladder normal in appearance and contours.  Prostate 30 to 40 g, numerous prostate calcifications    Assessment & Plan:    BPH without obstruction/lower urinary tract  symptoms Assessment & Plan: Chronic LUTS Parkinson's disease IPSS 19/4, PVR 52cc 30-40g prostate, small median lobe - CT imaging UA negative (Sep 2025), PSA 1.52  Today we reviewed the physiology and common causes of male lower urinary tract symptoms (LUTS). Discussed potential etiologies including infectious, inflammatory, bladder-related, benign prostatic hyperplasia (BPH), and musculoskeletal/pelvic floor contributions. Reviewed the standard diagnostic workup (urinalysis, PVR, uroflow, prostate assessment, possible  cystoscopy or imaging) and the spectrum of initial management strategies ranging from behavioral and lifestyle measures to pharmacologic therapy, with procedural options if indicated. All questions were addressed and the patient expressed understanding of the evaluation and treatment pathway.  - I suspect a component of Parkinsonian effects / neurogenic bladder-- which is irreversible and can be challenging to fully control symptoms  - Trial of trospium  20mg  at bedtime - May consider addition of Flomax as needed, a low SE profile medication (he does have hx of orthostasis and unsteady gait) - Follow up for symptom check, PVR  Trospium  is an anticholingeric medication indicated for overactive bladder. CNS penetration is lower than other agents, which can serve as a safer option in the geriatric population. Attention to a lower dose with renal impairment (GFR < 30). Doses typically start at 20mg  at bedtime on an empty stomach (particularly in > age 58), to 20mg  BID. SEs may include dry mouth, constipation, blurred vision, and/or confusion / delirium.    Orders: -     BLADDER SCAN AMB NON-IMAGING  Flank pain Assessment & Plan: Right flank pain / RLQ pain   Reviewed his clinical history and recent CT imaging.  I am not fully convinced CT imaging represents hydronephrosis--to me this is more extrarenal pelvis (similar to contralateral side).  However, if this does represent mild hydronephrosis, it is mild and does not explain his source of pain.  He has a negative urinalysis, no microhematuria-low likelihood of nonvisualized stone passage.   - Pain syndrome seems related to chronic back/MSK.  Inconsistent with true renal colic   Urge incontinence of urine Assessment & Plan: Related to OAB / Parkinson's, as above  May try condom catheter at nighttime to help  Provided information on a good commercial company: [employer] device   Jock itch Assessment & Plan: Perhaps mild jock  itch Although I suspect chemical dermatitis related to moisture / urine   - 5 day course of Diflucan  only - if non-improvement, I emphasized avoidance of moisture trapping, dessicant use, external condom catheter for now   Other orders -     Trospium  Chloride; Take 1 tablet (20 mg total) by mouth at bedtime.  Dispense: 90 tablet; Refill: 3 -     Fluconazole ; Take 0.5 tablets (50 mg total) by mouth daily for 5 days. X 7 days  Dispense: 7 tablet; Refill: 0      Penne Skye, MD 11/11/2023  [facility] [address], [address] [place], [place] [address] [phone]

## 2023-11-03 NOTE — Assessment & Plan Note (Addendum)
 Chronic LUTS Parkinson's disease IPSS 19/4, PVR 52cc 30-40g prostate, small median lobe - CT imaging UA negative (Sep 2025), PSA 1.52  Today we reviewed the physiology and common causes of male lower urinary tract symptoms (LUTS). Discussed potential etiologies including infectious, inflammatory, bladder-related, benign prostatic hyperplasia (BPH), and musculoskeletal/pelvic floor contributions. Reviewed the standard diagnostic workup (urinalysis, PVR, uroflow, prostate assessment, possible cystoscopy or imaging) and the spectrum of initial management strategies ranging from behavioral and lifestyle measures to pharmacologic therapy, with procedural options if indicated. All questions were addressed and the patient expressed understanding of the evaluation and treatment pathway.  - I suspect a component of Parkinsonian effects / neurogenic bladder-- which is irreversible and can be challenging to fully control symptoms  - Trial of trospium  20mg  at bedtime - May consider addition of Flomax as needed, a low SE profile medication (he does have hx of orthostasis and unsteady gait) - Follow up for symptom check, PVR  Trospium  is an anticholingeric medication indicated for overactive bladder. CNS penetration is lower than other agents, which can serve as a safer option in the geriatric population. Attention to a lower dose with renal impairment (GFR < 30). Doses typically start at 20mg  at bedtime on an empty stomach (particularly in > age 21), to 20mg  BID. SEs may include dry mouth, constipation, blurred vision, and/or confusion / delirium.

## 2023-11-04 ENCOUNTER — Ambulatory Visit: Payer: Self-pay | Admitting: Family Medicine

## 2023-11-10 ENCOUNTER — Ambulatory Visit (INDEPENDENT_AMBULATORY_CARE_PROVIDER_SITE_OTHER): Admitting: Urology

## 2023-11-10 VITALS — BP 91/64 | HR 60 | Ht 66.0 in | Wt 122.0 lb

## 2023-11-10 DIAGNOSIS — L293 Anogenital pruritus, unspecified: Secondary | ICD-10-CM

## 2023-11-10 DIAGNOSIS — R1031 Right lower quadrant pain: Secondary | ICD-10-CM

## 2023-11-10 DIAGNOSIS — R109 Unspecified abdominal pain: Secondary | ICD-10-CM | POA: Insufficient documentation

## 2023-11-10 DIAGNOSIS — N3941 Urge incontinence: Secondary | ICD-10-CM | POA: Diagnosis not present

## 2023-11-10 DIAGNOSIS — N400 Enlarged prostate without lower urinary tract symptoms: Secondary | ICD-10-CM

## 2023-11-10 DIAGNOSIS — B356 Tinea cruris: Secondary | ICD-10-CM | POA: Insufficient documentation

## 2023-11-10 DIAGNOSIS — R32 Unspecified urinary incontinence: Secondary | ICD-10-CM | POA: Insufficient documentation

## 2023-11-10 LAB — BLADDER SCAN AMB NON-IMAGING: Scan Result: 52

## 2023-11-10 MED ORDER — FLUCONAZOLE 100 MG PO TABS: 50.0000 mg | ORAL_TABLET | Freq: Every day | ORAL | 0 refills | Status: AC

## 2023-11-10 MED ORDER — TROSPIUM CHLORIDE 20 MG PO TABS: 20.0000 mg | ORAL_TABLET | Freq: Every day | ORAL | 3 refills | Status: AC

## 2023-11-10 NOTE — Assessment & Plan Note (Signed)
 Right flank pain / RLQ pain   Reviewed his clinical history and recent CT imaging.  I am not fully convinced CT imaging represents hydronephrosis--to me this is more extrarenal pelvis (similar to contralateral side).  However, if this does represent mild hydronephrosis, it is mild and does not explain his source of pain.  He has a negative urinalysis, no microhematuria-low likelihood of nonvisualized stone passage.   - Pain syndrome seems related to chronic back/MSK.  Inconsistent with true renal colic

## 2023-11-10 NOTE — Patient Instructions (Signed)
 [email or web address]  Use this resource and website for commercial external condom catheter prodcuts

## 2023-11-10 NOTE — Assessment & Plan Note (Signed)
 Perhaps mild jock itch Although I suspect chemical dermatitis related to moisture / urine   - 5 day course of Diflucan  only - if non-improvement, I emphasized avoidance of moisture trapping, dessicant use, external condom catheter for now

## 2023-11-10 NOTE — Assessment & Plan Note (Signed)
 Related to OAB / Parkinson's, as above  May try condom catheter at nighttime to help  Provided information on a good commercial company: [employer] device
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
## 2023-12-20 ENCOUNTER — Encounter: Payer: Self-pay | Admitting: Urology

## 2023-12-20 ENCOUNTER — Ambulatory Visit (INDEPENDENT_AMBULATORY_CARE_PROVIDER_SITE_OTHER): Admitting: Urology

## 2023-12-20 VITALS — BP 100/60 | HR 63 | Wt 122.2 lb

## 2023-12-20 DIAGNOSIS — N1330 Unspecified hydronephrosis: Secondary | ICD-10-CM

## 2023-12-20 DIAGNOSIS — N400 Enlarged prostate without lower urinary tract symptoms: Secondary | ICD-10-CM | POA: Diagnosis not present

## 2023-12-20 LAB — BLADDER SCAN AMB NON-IMAGING

## 2023-12-20 NOTE — Patient Instructions (Signed)

## 2023-12-24 ENCOUNTER — Ambulatory Visit: Admitting: Physician Assistant

## 2024-01-11 ENCOUNTER — Ambulatory Visit: Admitting: Psychiatry

## 2024-01-11 ENCOUNTER — Encounter: Payer: Self-pay | Admitting: Psychiatry

## 2024-01-11 DIAGNOSIS — F3341 Major depressive disorder, recurrent, in partial remission: Secondary | ICD-10-CM

## 2024-01-11 DIAGNOSIS — F411 Generalized anxiety disorder: Secondary | ICD-10-CM

## 2024-01-11 DIAGNOSIS — G894 Chronic pain syndrome: Secondary | ICD-10-CM | POA: Diagnosis not present

## 2024-01-11 MED ORDER — PAROXETINE HCL 40 MG PO TABS: ORAL_TABLET | ORAL | 1 refills | Status: AC

## 2024-01-11 MED ORDER — PAROXETINE HCL 20 MG PO TABS: ORAL_TABLET | ORAL | 1 refills | Status: AC

## 2024-01-11 MED ORDER — GABAPENTIN 100 MG PO CAPS: ORAL_CAPSULE | ORAL | 1 refills | Status: AC

## 2024-01-11 NOTE — Progress Notes (Signed)
 Joseph Arroyo [id number] 1941-01-13 82 y.o.  Virtual Visit via Telephone Note  I connected with pt by telephone and verified that I am speaking with the correct person using two identifiers.   I discussed the limitations, risks, security and privacy concerns of performing an evaluation and management service by telephone and the availability of in person appointments. I also discussed with the patient that there may be a patient responsible charge related to this service. The patient expressed understanding and agreed to proceed.  I discussed the assessment and treatment plan with the patient. The patient was provided an opportunity to ask questions and all were answered. The patient agreed with the plan and demonstrated an understanding of the instructions.   The patient was advised to call back or seek an in-person evaluation if the symptoms worsen or if the condition fails to improve as anticipated.  I provided 30 minutes of non-face-to-face time during this encounter. The patient was located at home and the provider was located office. Session [phone]  Subjective:   Patient ID:  Joseph Arroyo is a 83 y.o. (DOB 17-Oct-1940) male.  Chief Complaint:  Chief Complaint  Patient presents with   Follow-up   Depression   Anxiety   Stress    health    Joseph Arroyo ( lor-on) presents to the office today for follow-up of major depression and generalized anxiety disorder.    He was seen December, 2020 & June 2021.  No meds were changed.  Continued paroxetine  60 mg daily plus gabapentin  100 mg twice daily and Mirapex  0.125 mg 3 times daily.   08/2019 appt without med changes noted: Tolerates the meds well.  Wife thinks he's doing very well and he agrees.   No complaints.  2 D's.   Still exercising and has fatigue but can function and enjoys activity. Restarted men's Bible study.  He feels positive and hopeful and useful.  At some point would like to be free of  medication.  Does not feel guilty about the meds.    03/19/2020 appt noted:  Seen with wife Joseph Arroyo August and took Ivermectin and both had it mild. As far mood concerned he's doing well.  Wife agrees.  Not markedly depressed or anxious.  Feels the suffering of others.  Arzella Hug in hospital in [place].  Friend with Covid also. No episodes. Tolerating meds.  No Xanax  used.  Attend [employer] in [place]. Usually sleep well but back pain can interfere with sleep. TX for PD and a bit slower per wife. Plan: Try increasing gabapentin  to 100 mg AM & 200 mg PM for back pain.   Continue paroxetine  60 mg daily and Deplin 15 mg daily  10/03/2020 appointment with the following noted: Seen with wife, Joseph Arroyo and on increased gabapentin  at PM to 200 mg for back pain and sleeps better.   Not depressed.  Anxiety is manageable.   Seeing chiropracter who he likes Dr. Clovia in [place]. No SE noted with meds.  Perspire heavily. No panic attacks.   Sleep variable and is usually OK. Still leads men's Bible Study. Uses [employer] Plan: No med change  03/25/2021 appointment with the following noted: with wife Joseph Arroyo but came on quickly. Shaking with anxiety and been going on for 3-4 weeks. Rare use of Xanax . A lot of physical px with constant pain with back. Pending workup.  Fell 3 weeks ago. T pain clinic now.  Everything seemed to pile up. Insomnia with  mind racing with worry.  He can't tolerate negativity.   Rare panic. Consistent with paroxetine  60 mg daily. Consistent with gabapentin  100 AM and 200 PM Plan: For persistent symptoms of anxiety we will start Arroyo label trial of clonidine .  Cautioned about side effects.  05/13/2021 appointment with the following noted: wife Joseph on call Taking clonidine  0.1 mg tablet 1/2 tablets twice daily. Can't tell if there has been benefit from this.   Still taking Xanax  but usually about one daily. Couldn't get here DT pain.  Been to pain  clinic but no meds yet.  Problems with insurance company giving approval for the tests.  At times pain is excruciating.   BP has been OK.   Being cautious about getting up and protecting from dizziness. Tremors will wax and wane and worse with stress.   Fighting over the idea God does not want him to be like this and feels there is something wrong with him.   No SI  that's not an option.   Plan: Continue paroxetine  60 mg daily DT PD hesitate to use atypicals for TR anxiety. Consider increasing gabapentin  for anxiety Arroyo label.  Yes increase to 200 mg BID Ok Xanax   Clonidine  0.1 mg tablets 1/2 tablet twice daily    07/03/2021 appointment with the following noted: seen with Joseph on call Using Xanax  prn 1-2 daily. Increased gabapentin  100 mg AM and noon and 200 mg PM; hard to tell if it helped any but it might have.  No SE noted. Feels fine today and thinks there's improvement.  Believe the Jacquetta is bringing him through this. Epidural recently.  Some pain relief. Per wife has had some anxious moments.   Tremors vary.   Some PT, balance a little worse since Xmas.   No low BP. When sleep is fine but 2-3 hours at a time and nocturia. He thinks depression is a lot better, today I feel good.  Needs to get out socially.  He's a people person.  She thinks today is best day since Jan.   Plan: Consider increasing gabapentin  for anxiety Arroyo label.  Yes increase to 200 mg BID  09/09/2021 appointment with following noted: with wife Joseph Doing ok thank you. A lot better with depression and anxiety. Gabapentin  100 mg AM and noon and 200 HS. Gets sleepy in Am after breakfast with Sinemet .  Takes a nap. Sleep pretty well. Disc scamming but they have been careful. Wife notices anxiety and tremors reenforce each other.  Only one Xanax  daily.  Wants to go out daily. Not attending main church but can go to house church and is getting out more. No SE except sleepiness. 3 back pain procedures with some  benefit. Started speech therapy and OT and he likes to be active. Plan: no med changes Continur paroxetine  60 mg daily gabapentin  100 mg capsules 1 every morning and noon and 200 mg nightly,  01/12/22 appt noted:  (Joseph Arroyo) Continues alprazolam  0.25 mg 3 times daily as needed anxiety (taking 1 daily), clonidine  0.1 mg tablets one half twice daily, gabapentin  100 mg every morning and noon and 200 mg nightly, L-methylfolate, paroxetine  20 mg every morning and 40 mg nightly, pramipexole  0.125 mg nightly. Increased carbi-levo to 2 and 1/2 tab TID.   Vivid dreams.  Last night a disturbing one.  A couple of times in past remotely acted out the dream.   Feeling tired with the meds.  Pain meds also make him tired and PD too.  Continues  to believe God will heal him completely.  Getting facet blocks at the pain doc.  Continues shots. Depression is minimal but more anxiety than depression.  Will sometimes Xanax  for tremor.  Not alsways used daily.  Finished speech therapy but still doing PT.  Is better when he goes out somewhere.  Family coming soon,  Sleep is otherwise OK.   Tendency to constipation or diarrhea.   Asks about natural meds to help his medical problems if possible.  He'd like some changes .   Church built him a ramp for his house. Plan: No med changes   05/13/2022 appointment noted:  with Joseph, reviewed med list with her Not taking pramipexole  hs but is taking gabapentin . More difficulty speaking than he has been in the past.   Holding my own.  Struggling quite a bit.  More anxiety provoked by tremor or bad dreams.  Latter caused bad dreams.  Still some.  Not acting out dreams like before.  Bad dreams about 2 times per week.   Usually sleep 2 hours at time with nocturia.   PD sx are worse per wife.  Harder to walk and do things.   No VH with meds for PD. Anxiety remains pretty high and needs the Xanax .   ? Tiredness with meds.   Plan: Trial wean  and if anxiety is worse then resume  Clonidine  0.1 mg tablets 1/2 tablet twice daily    06/30/22 appt noted: with Joseph Radio procedure for pain recently and too early to tell how it will work.  Never had it before.   Taking alprazolam  prn Taking gabapentin  100 mg prn.  Clonidine  0.05 mg HS. Paroxetine  60 mg daily. BP is better with less.   Anxiety worse middday when tremors are worse. Sleep 2 h then nocturia and then 1/2 hour to get back to sleep.  May take 1/2 hour to get back to bed DT PD slowness. Unsure how much sleep he's getting.  Not sure if reducing clonidine  increased the anxiety or not. No panic.  No crying spells.  Can't cry.    11/03/22 appt noted: with Joseph Arroyo clonidine .  He didn't notice any difference Arroyo of it. Had several procedures and trying to limit meds.  Still having pain issues.  Steroid injx have steroid which increases anxiety.  Not as high now but still there.  Xanax  0.25 mg prn (about 1 daily). Gabapentin  makes him drowsy in the AM Sleep 2 h then nocturia and then 1/2 hour to get back to sleep.  May take 1/2 hour to get back to bed DT PD slowness. Unsure how much sleep he's getting.  Doses a good bit in the day too. Steroids tend to affect mood also.   No other problems with meds.   Makes a point of getting out of the house.  Walks at grocery store and doing some PT.  Vertigo PT [place].  03/10/23 appt noted: with Joseph virtual Psych meds: paroxetine  60, gabapentin  100 mg BID and 200 mg HS for anxiety and pain, Xanax  0.25 mg BID prn Per wife doing pretty good with vertigo with a little at times.  PT helped.   Stable per pt.  At times feels anxious and tries to deal with it in prayer.  At times of day will be more anxious even if hungry.  Try to avoid triggers for anxiety.   No full panic lately.  Will use Xanax  prn infrequently.   Some awakening and will pray and recite  scripture.   No severe dep.  Gets down over his health at times. No specific concerns with meds.   SE some sleepiness in the AM  with Sinemet .   Thankful D Hope returned to [facility] gave them a hospital bed.   Plan: No med changes  07/28/23 appt noted: Psych meds: paroxetine  60, gabapentin  100 mg BID and 200 mg HS for anxiety and pain, Xanax  0.25 mg BID prn rare, not often oxycodone  5 No SE concerns with psych meds. Ongoing weakness and gait px.  Increased Sinemet .  Causes some sleepiness .   Disc timing of L-methylfolate.   B in law died in 2023/07/09.  Affected pt a little. Rare near panic but periods of anxiety and rarely takes BZ.   Appetite is fine.  Had lost wt before but stabilized.  Wt down to 119# and up to 125#. Sleep is good.  Energy affected by PD>   PD tx helps some.   Tries not to nap.   Has some men who come to support him weekly.  Bringing Church to him.   D helps him exercise to try to stay mobile.    01/11/24 appt noted:  with Joseph Psych meds: paroxetine  60, gabapentin  100 mg BID and 200 mg HS for anxiety and pain, Xanax  0.25 mg BID prn rare, not often oxycodone  5 No SE concerns with psych meds. Neuro gave him selegiline to take prn for energy.  Has taken about 5 times without Se and brief energy out of it.  Bladder urgency issues and looking into it with urology.  Nocturia.  But then sleeps some in the day also. Anxiety some in the afternoon. Can't sit long enough to go to church bc back pain.  Listens to messages at home.  Trying to walk and do exercises as much as possible.  Trying to stay active.  Walks at grocery.  Uses walker.   RBD once in while and neuro dx and aware.   Was 12 years free of medication until this last relapse and yet it took a long time to get relief again.  Wife, Joseph, said overall he's been doing well.  No prn BZ in a couple of years.  Past Psychiatric Medication Trials: Under our care since 2013. Paroxetine  60, duloxetine, fluoxetine, Luvox, venlafaxine, Trintellix, ,  mirtazapine, doxepin,  Gabapentin  200 BID Deplin,  clonazepam, lorazepam, alprazolam    buspirone,   Clonidine  0.05 BID for anxiety lamotrigine,   Abilify, Seroquel, Zyprexa,  risperidone, Saphris,  lithium with some benefit,  Pramipexole    Review of Systems:  Review of Systems  Constitutional:  Positive for fatigue.  HENT:  Positive for voice change.   Cardiovascular:  Negative for palpitations.  Gastrointestinal:  Positive for diarrhea.  Musculoskeletal:  Positive for back pain and gait problem.  Neurological:  Positive for tremors and weakness. Negative for dizziness.  Psychiatric/Behavioral:  Negative for dysphoric mood. The patient is nervous/anxious.     Medications: I have reviewed the patient's current medications.  Current Outpatient Medications  Medication Sig Dispense Refill   acetaminophen  (TYLENOL ) 500 MG tablet Take 1,000 mg by mouth every 8 (eight) hours as needed.     ALPRAZolam  (XANAX ) 0.25 MG tablet Take 1 tablet (0.25 mg total) by mouth 3 (three) times daily as needed for anxiety. 90 tablet 0   B Complex Vitamins (VITAMIN B COMPLEX PO) Take 1 tablet by mouth daily.     Capsicum-Garlic 200-300 MG CAPS Take 200-300 mg by mouth.  carbidopa -levodopa  (SINEMET  IR) 25-100 MG tablet Take 2.5 tablets by mouth 3 (three) times daily.     Carbidopa -Levodopa  ER (SINEMET  CR) 25-100 MG tablet controlled release Take by mouth.     ciclopirox (PENLAC) 8 % solution Apply topically at bedtime.     clobetasol (TEMOVATE) 0.05 % external solution Apply 1 Application topically daily.     gabapentin  (NEURONTIN ) 100 MG capsule 1 in AM and noon and 2 at night 360 capsule 1   HAWTHORNE BERRY PO Take by mouth daily.     L-Methylfolate 15 MG TABS Take 1 tablet (15 mg total) by mouth daily. 90 tablet 3   Lutein 10 MG TABS Take 1 tablet by mouth 3 (three) times daily. 1 daily     Naftifine HCl 2 % GEL Apply 1 Application topically daily.     oxyCODONE  (OXY IR/ROXICODONE ) 5 MG immediate release tablet Take 1 tablet (5 mg total) by mouth 3 (three) times daily as needed for  severe pain (pain score 7-10) or moderate pain (pain score 4-6). 45 tablet 0   PARoxetine  (PAXIL ) 20 MG tablet TAKE 1 TABLET BY MOUTH EVERY DAY (WITH THE 40 MG TABLET FOR 60 MG TOTAL) 90 tablet 1   PARoxetine  (PAXIL ) 40 MG tablet TAKE 1 TABLET BY MOUTH EVERY DAY (WITH THE 20 MG TABLET FOR 60 MG TOTAL) 90 tablet 1   Saw Palmetto, Serenoa repens, 1000 MG CAPS Take 2 capsules by mouth daily.     selegiline (ELDEPRYL) 5 MG tablet Take 5 mg by mouth daily before breakfast. (Patient taking differently: Take 5 mg by mouth daily before breakfast. rare)     trospium  (SANCTURA ) 20 MG tablet Take 1 tablet (20 mg total) by mouth at bedtime. 90 tablet 3   No current facility-administered medications for this visit.    Medication Side Effects: Fatigue and Other: from Mirapex  esp in AM  Allergies:  Allergies  Allergen Reactions   Prednisone     Hyperactivity    Wheat Diarrhea    Past Medical History:  Diagnosis Date   Allergic rhinitis due to pollen 11/21/2007   Brachial neuritis or radiculitis NOS    Cervicalgia    Concussion    age 41 - s/p accident   Costal chondritis    Depression    Essential and other specified forms of tremor    GERD (gastroesophageal reflux disease)    in past   Lumbago    Occlusion and stenosis of carotid artery without mention of cerebral infarction    Seizures (HCC)    age 45 - after concussion    Family History  Problem Relation Age of Onset   Heart failure Mother        enlarged heart    Asthma Father     Social History   Socioeconomic History   Marital status: Married    Spouse name: Joseph Arroyo   Number of children: 2   Years of education: 12   Highest education level: Master's degree (e.g., MA, MS, MEng, MEd, MSW, MBA)  Occupational History   Occupation: Retired Film/video Editor (English)    Employer: RETIRED  Tobacco Use   Smoking status: Never   Smokeless tobacco: Never  Vaping Use   Vaping status: Never Used  Substance and Sexual Activity    Alcohol use: No    Alcohol/week: 0.0 standard drinks of alcohol   Drug use: No   Sexual activity: Not Currently  Other Topics Concern   Not on file  Social  History Narrative   Married to Joseph, has 2 children, has grandchildren   Right handed   Master's plus   He is born in [place], heritage is French (Father's side), [place] (Mother's side)      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    Social Drivers of Corporate Investment Banker Strain: Low Risk  (06/03/2022)   Overall Financial Resource Strain (CARDIA)    Difficulty of Paying Living Expenses: Not hard at all  Food Insecurity: No Food Insecurity (06/08/2022)   Hunger Vital Sign    Worried About Running Out of Food in the Last Year: Never true    Ran Out of Food in the Last Year: Never true  Transportation Needs: No Transportation Needs (06/08/2022)   PRAPARE - Administrator, Civil Service (Medical): No    Lack of Transportation (Non-Medical): No  Physical Activity: Insufficiently Active (06/03/2022)   Exercise Vital Sign    Days of Exercise per Week: 3 days    Minutes of Exercise per Session: 30 min  Stress: No Stress Concern Present (06/03/2022)   [employer] of Occupational Health - Occupational Stress Questionnaire    Feeling of Stress : Only a little  Social Connections: Socially Integrated (06/03/2022)   Social Connection and Isolation Panel    Frequency of Communication with Friends and Family: More than three times a week    Frequency of Social Gatherings with Friends and Family: Twice a week    Attends Religious Services: More than 4 times per year    Active Member of [employer] or Organizations: Yes    Attends Banker Meetings: 1 to 4 times per year    Marital Status: Married  Catering Manager Violence: Not At Risk (06/03/2022)   Humiliation, Afraid, Rape, and Kick questionnaire    Fear of Current or Ex-Partner: No    Emotionally Abused: No    Physically Abused: No    Sexually Abused: No     Past Medical History, Surgical history, Social history, and Family history were reviewed and updated as appropriate.   Please see review of systems for further details on the patient's review from today.   Objective:   Physical Exam:  There were no vitals taken for this visit.  Physical Exam Neurological:     Mental Status: He is alert and oriented to person, place, and time.     Cranial Nerves: Dysarthria present.     Comments: Mild dysarthria  Psychiatric:        Attention and Perception: Attention and perception normal.        Mood and Affect: Mood is anxious. Mood is not depressed.        Behavior: Behavior is cooperative.        Thought Content: Thought content normal. Thought content is not paranoid or delusional. Thought content does not include homicidal or suicidal ideation. Thought content does not include suicidal plan.        Cognition and Memory: Cognition and memory normal.        Judgment: Judgment normal.     Comments: Insight intact Voice change with PD with reduced volume.   Still residual sx     Lab Review:     Component Value Date/Time   NA 136 11/02/2023 1148   NA 143 07/10/2015 0821   K 4.2 11/02/2023 1148   CL 99 11/02/2023 1148   CO2 28 11/02/2023 1148   GLUCOSE 97 11/02/2023 1148   BUN 26 (H) 11/02/2023 1148  BUN 13 07/10/2015 0821   CREATININE 0.60 (L) 11/02/2023 1148   CALCIUM 9.5 11/02/2023 1148   PROT 6.5 11/02/2023 1148   PROT 6.8 07/10/2015 0821   ALBUMIN 4.1 03/24/2021 1351   ALBUMIN 4.2 07/10/2015 0821   AST 18 11/02/2023 1148   ALT 11 11/02/2023 1148   ALKPHOS 51 03/24/2021 1351   BILITOT 0.6 11/02/2023 1148   BILITOT 0.5 07/10/2015 0821   GFRNONAA >60 03/24/2021 1351   GFRNONAA 86 09/07/2019 0941   GFRAA 100 09/07/2019 0941       Component Value Date/Time   WBC 6.6 11/02/2023 1148   RBC 4.25 11/02/2023 1148   HGB 13.5 11/02/2023 1148   HGB 15.3 10/23/2014 1237   HCT 40.2 11/02/2023 1148   HCT 45.1 10/23/2014 1237    PLT 201 11/02/2023 1148   PLT 150 10/23/2014 1237   MCV 94.6 11/02/2023 1148   MCV 88 10/23/2014 1237   MCH 31.8 11/02/2023 1148   MCHC 33.6 11/02/2023 1148   RDW 11.9 11/02/2023 1148   RDW 13.3 10/23/2014 1237   LYMPHSABS 1,305 10/02/2022 1433   LYMPHSABS 1.7 10/23/2014 1237   MONOABS 357 07/24/2016 1050   EOSABS 429 11/02/2023 1148   EOSABS 0.3 10/23/2014 1237   BASOSABS 59 11/02/2023 1148   BASOSABS 0.1 10/23/2014 1237    No results found for: POCLITH, LITHIUM   No results found for: PHENYTOIN, PHENOBARB, VALPROATE, CBMZ   .res Assessment: Plan:    Joseph Arroyo was seen today for follow-up, depression, anxiety and stress.  Diagnoses and all orders for this visit:  Recurrent major depression in partial remission  GAD (generalized anxiety disorder)  Chronic pain syndrome   History of extremely severe TR depression and anxiety with multiple med failures.  Doing relatively well for 2-3 years.    But residual dep and anxiety sx contributed to by pain and advancing Parkinson's disease.  He has been consistent with the paroxetine  but would like for God to heal him so he can stop meds.    Still works out at gym.  Disc pricing L-methylfolate with [facility]. Continue paroxetine  60 mg daily Hesitate to increase paroxetine  further  Parkinson's and back pain ongoing is worse.  DT PD hesitate to use atypicals for TR anxiety.      Continue gabapentin  for anxiety Arroyo label and pain 100 mg BID and 200 mg HS.  Option reduce for energy.  Disc this.  Ok Xanax  0.25 mg TID prn.  He's not using much, usually only 1 daily.   We discussed the short-term risks associated with benzodiazepines including sedation and increased fall risk among others.  Discussed long-term side effect risk including dependence, potential withdrawal symptoms, and the potential eventual dose-related risk of dementia.  But recent studies from 2020 dispute this association between benzodiazepines and dementia  risk. Newer studies in 2020 do not support an association with dementia.  Counseling 20 min on dealing with health problems and faith.    Tolerating and benefiting from meds.    Answered questions about Vitamin D .  He's taking 4000 units  No med changes  Disc risk of selegiline with paroxetine  and strongly advise against it.   Disc risk htn crisis or serotonin syndrome.  Rec discuss with neuro.    FU 6 mos  Joseph Macintosh, MD, DFAPA   Please see After Visit Summary for patient specific instructions.  No future appointments.   No orders of the defined types were placed in this encounter.    -------------------------------

## 2024-05-10 ENCOUNTER — Ambulatory Visit: Admitting: Physician Assistant

## 2024-07-03 ENCOUNTER — Telehealth: Admitting: Psychiatry
